# Patient Record
Sex: Male | Born: 1945 | Race: White | Hispanic: No | Marital: Married | Smoking: Former smoker
Health system: Southern US, Community
[De-identification: ages and names within clinical notes are randomized; demographics above are authoritative.]

## PROBLEM LIST (undated history)

## (undated) DIAGNOSIS — I2510 Atherosclerotic heart disease of native coronary artery without angina pectoris: Secondary | ICD-10-CM

## (undated) DIAGNOSIS — Z973 Presence of spectacles and contact lenses: Secondary | ICD-10-CM

## (undated) DIAGNOSIS — K219 Gastro-esophageal reflux disease without esophagitis: Secondary | ICD-10-CM

## (undated) DIAGNOSIS — Z974 Presence of external hearing-aid: Secondary | ICD-10-CM

## (undated) DIAGNOSIS — N2889 Other specified disorders of kidney and ureter: Secondary | ICD-10-CM

## (undated) DIAGNOSIS — Z952 Presence of prosthetic heart valve: Secondary | ICD-10-CM

## (undated) DIAGNOSIS — E785 Hyperlipidemia, unspecified: Secondary | ICD-10-CM

## (undated) DIAGNOSIS — I350 Nonrheumatic aortic (valve) stenosis: Secondary | ICD-10-CM

## (undated) DIAGNOSIS — R011 Cardiac murmur, unspecified: Secondary | ICD-10-CM

## (undated) DIAGNOSIS — Z5189 Encounter for other specified aftercare: Secondary | ICD-10-CM

## (undated) DIAGNOSIS — I219 Acute myocardial infarction, unspecified: Secondary | ICD-10-CM

## (undated) DIAGNOSIS — I10 Essential (primary) hypertension: Secondary | ICD-10-CM

## (undated) DIAGNOSIS — M1990 Unspecified osteoarthritis, unspecified site: Secondary | ICD-10-CM

## (undated) DIAGNOSIS — E119 Type 2 diabetes mellitus without complications: Secondary | ICD-10-CM

## (undated) DIAGNOSIS — Z8673 Personal history of transient ischemic attack (TIA), and cerebral infarction without residual deficits: Secondary | ICD-10-CM

## (undated) DIAGNOSIS — C259 Malignant neoplasm of pancreas, unspecified: Secondary | ICD-10-CM

## (undated) DIAGNOSIS — K08109 Complete loss of teeth, unspecified cause, unspecified class: Secondary | ICD-10-CM

## (undated) DIAGNOSIS — Z87891 Personal history of nicotine dependence: Secondary | ICD-10-CM

## (undated) DIAGNOSIS — H9190 Unspecified hearing loss, unspecified ear: Secondary | ICD-10-CM

## (undated) DIAGNOSIS — C649 Malignant neoplasm of unspecified kidney, except renal pelvis: Secondary | ICD-10-CM

## (undated) DIAGNOSIS — J189 Pneumonia, unspecified organism: Secondary | ICD-10-CM

## (undated) HISTORY — PX: HAND SURGERY: SHX662

## (undated) HISTORY — PX: HERNIA REPAIR: SHX51

## (undated) HISTORY — DX: Type 2 diabetes mellitus without complications: E11.9

## (undated) HISTORY — PX: APPENDECTOMY: SHX54

## (undated) HISTORY — PX: SHOULDER ARTHROSCOPY WITH ROTATOR CUFF REPAIR: SHX5685

## (undated) HISTORY — DX: Atherosclerotic heart disease of native coronary artery without angina pectoris: I25.10

## (undated) HISTORY — PX: UPPER GASTROINTESTINAL ENDOSCOPY: SHX188

## (undated) HISTORY — DX: Malignant neoplasm of unspecified kidney, except renal pelvis: C64.9

## (undated) HISTORY — DX: Unspecified osteoarthritis, unspecified site: M19.90

## (undated) HISTORY — DX: Essential (primary) hypertension: I10

## (undated) HISTORY — PX: MULTIPLE TOOTH EXTRACTIONS: SHX2053

## (undated) HISTORY — PX: CARDIAC CATHETERIZATION: SHX172

## (undated) HISTORY — DX: Personal history of transient ischemic attack (TIA), and cerebral infarction without residual deficits: Z86.73

## (undated) HISTORY — PX: EYE SURGERY: SHX253

## (undated) HISTORY — DX: Hyperlipidemia, unspecified: E78.5

## (undated) HISTORY — PX: CORONARY STENT PLACEMENT: SHX6853

## (undated) HISTORY — DX: Encounter for other specified aftercare: Z51.89

---
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
## 2008-08-31 ENCOUNTER — Emergency Department (HOSPITAL_COMMUNITY): Admission: EM | Admit: 2008-08-31 | Discharge: 2008-08-31 | Payer: Self-pay | Admitting: Emergency Medicine

## 2015-11-16 DIAGNOSIS — E119 Type 2 diabetes mellitus without complications: Secondary | ICD-10-CM | POA: Diagnosis not present

## 2015-11-16 DIAGNOSIS — F17200 Nicotine dependence, unspecified, uncomplicated: Secondary | ICD-10-CM | POA: Diagnosis not present

## 2015-11-16 DIAGNOSIS — I350 Nonrheumatic aortic (valve) stenosis: Secondary | ICD-10-CM | POA: Diagnosis not present

## 2015-11-16 DIAGNOSIS — I10 Essential (primary) hypertension: Secondary | ICD-10-CM | POA: Diagnosis not present

## 2015-11-16 DIAGNOSIS — E785 Hyperlipidemia, unspecified: Secondary | ICD-10-CM | POA: Diagnosis not present

## 2015-12-07 DIAGNOSIS — Z125 Encounter for screening for malignant neoplasm of prostate: Secondary | ICD-10-CM | POA: Diagnosis not present

## 2015-12-07 DIAGNOSIS — Z79899 Other long term (current) drug therapy: Secondary | ICD-10-CM | POA: Diagnosis not present

## 2015-12-07 DIAGNOSIS — I10 Essential (primary) hypertension: Secondary | ICD-10-CM | POA: Diagnosis not present

## 2015-12-07 DIAGNOSIS — K219 Gastro-esophageal reflux disease without esophagitis: Secondary | ICD-10-CM | POA: Diagnosis not present

## 2015-12-07 DIAGNOSIS — R0989 Other specified symptoms and signs involving the circulatory and respiratory systems: Secondary | ICD-10-CM | POA: Diagnosis not present

## 2015-12-07 DIAGNOSIS — E782 Mixed hyperlipidemia: Secondary | ICD-10-CM | POA: Diagnosis not present

## 2015-12-07 DIAGNOSIS — E119 Type 2 diabetes mellitus without complications: Secondary | ICD-10-CM | POA: Diagnosis not present

## 2015-12-07 DIAGNOSIS — R011 Cardiac murmur, unspecified: Secondary | ICD-10-CM | POA: Diagnosis not present

## 2015-12-11 DIAGNOSIS — E782 Mixed hyperlipidemia: Secondary | ICD-10-CM | POA: Diagnosis not present

## 2015-12-11 DIAGNOSIS — Z79899 Other long term (current) drug therapy: Secondary | ICD-10-CM | POA: Diagnosis not present

## 2015-12-11 DIAGNOSIS — I10 Essential (primary) hypertension: Secondary | ICD-10-CM | POA: Diagnosis not present

## 2015-12-11 DIAGNOSIS — E119 Type 2 diabetes mellitus without complications: Secondary | ICD-10-CM | POA: Diagnosis not present

## 2015-12-11 DIAGNOSIS — Z125 Encounter for screening for malignant neoplasm of prostate: Secondary | ICD-10-CM | POA: Diagnosis not present

## 2015-12-14 DIAGNOSIS — R0989 Other specified symptoms and signs involving the circulatory and respiratory systems: Secondary | ICD-10-CM | POA: Diagnosis not present

## 2016-01-08 DIAGNOSIS — H2513 Age-related nuclear cataract, bilateral: Secondary | ICD-10-CM | POA: Diagnosis not present

## 2016-01-08 DIAGNOSIS — Z720 Tobacco use: Secondary | ICD-10-CM | POA: Diagnosis not present

## 2016-01-08 DIAGNOSIS — H47321 Drusen of optic disc, right eye: Secondary | ICD-10-CM | POA: Diagnosis not present

## 2016-01-08 DIAGNOSIS — H524 Presbyopia: Secondary | ICD-10-CM | POA: Diagnosis not present

## 2016-01-22 DIAGNOSIS — Z720 Tobacco use: Secondary | ICD-10-CM | POA: Diagnosis not present

## 2016-01-22 DIAGNOSIS — Z0000 Encounter for general adult medical examination without abnormal findings: Secondary | ICD-10-CM | POA: Diagnosis not present

## 2016-01-22 DIAGNOSIS — E119 Type 2 diabetes mellitus without complications: Secondary | ICD-10-CM | POA: Diagnosis not present

## 2016-04-06 DIAGNOSIS — Z119 Encounter for screening for infectious and parasitic diseases, unspecified: Secondary | ICD-10-CM | POA: Diagnosis not present

## 2016-04-06 DIAGNOSIS — Z79899 Other long term (current) drug therapy: Secondary | ICD-10-CM | POA: Diagnosis not present

## 2016-04-06 DIAGNOSIS — E119 Type 2 diabetes mellitus without complications: Secondary | ICD-10-CM | POA: Diagnosis not present

## 2016-04-06 DIAGNOSIS — I10 Essential (primary) hypertension: Secondary | ICD-10-CM | POA: Diagnosis not present

## 2016-04-06 DIAGNOSIS — E782 Mixed hyperlipidemia: Secondary | ICD-10-CM | POA: Diagnosis not present

## 2016-04-11 DIAGNOSIS — K219 Gastro-esophageal reflux disease without esophagitis: Secondary | ICD-10-CM | POA: Diagnosis not present

## 2016-04-11 DIAGNOSIS — I10 Essential (primary) hypertension: Secondary | ICD-10-CM | POA: Diagnosis not present

## 2016-04-11 DIAGNOSIS — E782 Mixed hyperlipidemia: Secondary | ICD-10-CM | POA: Diagnosis not present

## 2016-04-11 DIAGNOSIS — R0989 Other specified symptoms and signs involving the circulatory and respiratory systems: Secondary | ICD-10-CM | POA: Diagnosis not present

## 2016-04-11 DIAGNOSIS — R011 Cardiac murmur, unspecified: Secondary | ICD-10-CM | POA: Diagnosis not present

## 2016-04-11 DIAGNOSIS — Z79899 Other long term (current) drug therapy: Secondary | ICD-10-CM | POA: Diagnosis not present

## 2016-04-11 DIAGNOSIS — E119 Type 2 diabetes mellitus without complications: Secondary | ICD-10-CM | POA: Diagnosis not present

## 2016-06-16 DIAGNOSIS — H35012 Changes in retinal vascular appearance, left eye: Secondary | ICD-10-CM | POA: Diagnosis not present

## 2016-08-01 DIAGNOSIS — Z79899 Other long term (current) drug therapy: Secondary | ICD-10-CM | POA: Diagnosis not present

## 2016-08-01 DIAGNOSIS — I10 Essential (primary) hypertension: Secondary | ICD-10-CM | POA: Diagnosis not present

## 2016-08-01 DIAGNOSIS — E782 Mixed hyperlipidemia: Secondary | ICD-10-CM | POA: Diagnosis not present

## 2016-08-01 DIAGNOSIS — E119 Type 2 diabetes mellitus without complications: Secondary | ICD-10-CM | POA: Diagnosis not present

## 2016-08-08 DIAGNOSIS — R011 Cardiac murmur, unspecified: Secondary | ICD-10-CM | POA: Diagnosis not present

## 2016-08-08 DIAGNOSIS — Z125 Encounter for screening for malignant neoplasm of prostate: Secondary | ICD-10-CM | POA: Diagnosis not present

## 2016-08-08 DIAGNOSIS — K219 Gastro-esophageal reflux disease without esophagitis: Secondary | ICD-10-CM | POA: Diagnosis not present

## 2016-08-08 DIAGNOSIS — E119 Type 2 diabetes mellitus without complications: Secondary | ICD-10-CM | POA: Diagnosis not present

## 2016-08-08 DIAGNOSIS — Z79899 Other long term (current) drug therapy: Secondary | ICD-10-CM | POA: Diagnosis not present

## 2016-08-08 DIAGNOSIS — R0989 Other specified symptoms and signs involving the circulatory and respiratory systems: Secondary | ICD-10-CM | POA: Diagnosis not present

## 2016-08-08 DIAGNOSIS — I10 Essential (primary) hypertension: Secondary | ICD-10-CM | POA: Diagnosis not present

## 2016-08-08 DIAGNOSIS — E782 Mixed hyperlipidemia: Secondary | ICD-10-CM | POA: Diagnosis not present

## 2016-10-20 DIAGNOSIS — H35043 Retinal micro-aneurysms, unspecified, bilateral: Secondary | ICD-10-CM | POA: Diagnosis not present

## 2016-11-11 DIAGNOSIS — I2109 ST elevation (STEMI) myocardial infarction involving other coronary artery of anterior wall: Secondary | ICD-10-CM | POA: Diagnosis not present

## 2016-11-11 DIAGNOSIS — I10 Essential (primary) hypertension: Secondary | ICD-10-CM | POA: Diagnosis not present

## 2016-11-11 DIAGNOSIS — F17210 Nicotine dependence, cigarettes, uncomplicated: Secondary | ICD-10-CM | POA: Diagnosis present

## 2016-11-11 DIAGNOSIS — I350 Nonrheumatic aortic (valve) stenosis: Secondary | ICD-10-CM | POA: Diagnosis not present

## 2016-11-11 DIAGNOSIS — I213 ST elevation (STEMI) myocardial infarction of unspecified site: Secondary | ICD-10-CM | POA: Diagnosis not present

## 2016-11-11 DIAGNOSIS — Z720 Tobacco use: Secondary | ICD-10-CM | POA: Diagnosis not present

## 2016-11-11 DIAGNOSIS — I2510 Atherosclerotic heart disease of native coronary artery without angina pectoris: Secondary | ICD-10-CM | POA: Diagnosis not present

## 2016-11-11 DIAGNOSIS — I2582 Chronic total occlusion of coronary artery: Secondary | ICD-10-CM | POA: Diagnosis not present

## 2016-11-11 DIAGNOSIS — R0789 Other chest pain: Secondary | ICD-10-CM | POA: Diagnosis not present

## 2016-11-11 DIAGNOSIS — E785 Hyperlipidemia, unspecified: Secondary | ICD-10-CM | POA: Diagnosis not present

## 2016-11-11 DIAGNOSIS — R079 Chest pain, unspecified: Secondary | ICD-10-CM | POA: Diagnosis not present

## 2016-11-11 DIAGNOSIS — E119 Type 2 diabetes mellitus without complications: Secondary | ICD-10-CM | POA: Diagnosis not present

## 2016-11-28 DIAGNOSIS — I2510 Atherosclerotic heart disease of native coronary artery without angina pectoris: Secondary | ICD-10-CM | POA: Diagnosis not present

## 2016-12-21 ENCOUNTER — Ambulatory Visit (INDEPENDENT_AMBULATORY_CARE_PROVIDER_SITE_OTHER): Payer: Medicare Other | Admitting: Cardiology

## 2016-12-21 ENCOUNTER — Encounter: Payer: Self-pay | Admitting: Cardiology

## 2016-12-21 VITALS — BP 162/84 | HR 69 | Ht 69.0 in | Wt 178.0 lb

## 2016-12-21 DIAGNOSIS — E118 Type 2 diabetes mellitus with unspecified complications: Secondary | ICD-10-CM

## 2016-12-21 DIAGNOSIS — E782 Mixed hyperlipidemia: Secondary | ICD-10-CM

## 2016-12-21 DIAGNOSIS — F17201 Nicotine dependence, unspecified, in remission: Secondary | ICD-10-CM | POA: Diagnosis not present

## 2016-12-21 DIAGNOSIS — I2109 ST elevation (STEMI) myocardial infarction involving other coronary artery of anterior wall: Secondary | ICD-10-CM

## 2016-12-21 DIAGNOSIS — I10 Essential (primary) hypertension: Secondary | ICD-10-CM | POA: Diagnosis not present

## 2016-12-21 DIAGNOSIS — I2510 Atherosclerotic heart disease of native coronary artery without angina pectoris: Secondary | ICD-10-CM | POA: Diagnosis not present

## 2016-12-21 DIAGNOSIS — R011 Cardiac murmur, unspecified: Secondary | ICD-10-CM

## 2016-12-21 NOTE — Progress Notes (Signed)
Cardiology Office Note  Date: 12/21/2016   ID: DELDRICK LINCH, DOB November 23, 1945, MRN [id number]  PCP: Renee Rival, NP  Consulting Cardiologist: Rozann Lesches, MD   Chief Complaint  Patient presents with  . Coronary Artery Disease    History of Present Illness: BARRETT GOLDIE is a 71 y.o. male presenting to the office to establish cardiology follow-up. This is our first meeting today. I reviewed extensive records and updated the chart. He is here today with his wife, lives in [place]. He is a retired Engineer, structural, enjoys Scientist, water quality and was traveling in [place] back in March when he presented with new onset chest tightness associated with bilateral arm numbness and diagnosis of an anterior STEMI on March 2. He underwent cardiac catheterization by Dr. Betsy Pries at [facility]. He was found to have a 70% mid LAD stenosis, occluded diagonal (culprit), normal circumflex, 90% ulcerated proximal RCA stenosis, and a 60-70% mid RCA stenosis. LVEF described at 60%. He underwent placement of DES to the mid LAD, diagonal, proximal RCA and mid RCA (4 stents total). He was discharged on aspirin and Brilinta, although due to dyspnea was switched from Brilinta to Plavix prior to leaving that area. He has had follow-up with his PCP since returning home, does not report any recurrent angina symptoms, recently got over a URI. He does not report any bleeding problems.  Patient reports a long-standing heart murmur, has not had a recent echocardiogram including during his hospital stay in [place]. He is not aware of any specific known valvular heart disease.  He states that he quit smoking this past August, has had no relapses. He reports compliance with his medications which are outlined below. He has been on a statin for hyperlipidemia, did not resume this after hospitalization in [place], was confused as to whether he should be on it or not.  I personally reviewed his  ECG today which shows sinus rhythm with poor anterior R progression, rule out old anterior infarct pattern, nonspecific ST-T changes.  We discussed his activity level. He is nearing 6 weeks out from his infarct. He does do some outdoor chores including using a riding Conservation officer, nature. We did discuss the possibility of cardiac rehabilitation but he did not want to pursue this at this point.  Past Medical History:  Diagnosis Date  . Arthritis   . CAD (coronary artery disease)    DES to mid LAD, DES to diagonal, DES x 2 to proximal and mid RCA - March 2018, [facility] [place]  . Essential hypertension   . Heart murmur   . Hyperlipidemia   . Type 2 diabetes mellitus ([place])     Past Surgical History:  Procedure Laterality Date  . APPENDECTOMY    . EYE SURGERY Left   . HAND SURGERY Bilateral   . HERNIA REPAIR Right   . SHOULDER ARTHROSCOPY WITH ROTATOR CUFF REPAIR Left     Current Outpatient Prescriptions  Medication Sig Dispense Refill  . aspirin EC 81 MG tablet Take 81 mg by mouth daily.    . carvedilol (COREG) 3.125 MG tablet TK ONE T PO BID  2  . clopidogrel (PLAVIX) 75 MG tablet TK  1 T  DAILY  6  . glipiZIDE (GLUCOTROL) 5 MG tablet Take by mouth daily before breakfast.    . lisinopril (PRINIVIL,ZESTRIL) 10 MG tablet TK ONE T PO D  2   No current facility-administered medications for this visit.    Allergies:  Patient has  no allergy information on record.   Social History: The patient  reports that he quit smoking about 8 months ago. His smoking use included Cigarettes. He started smoking about 58 years ago. He has never used smokeless tobacco. He reports that he does not drink alcohol or use drugs.   Family History: The patient's family history includes Lupus in his mother.   ROS:  Please see the history of present illness. Otherwise, complete review of systems is positive for arthritic pain affecting his back predominantly. States he had an old fracture in his back..  All other systems  are reviewed and negative.   Physical Exam: VS:  BP (!) 162/84 (BP Location: Right Arm)   Pulse 69   Ht 5' 9"  (1.753 m)   Wt 178 lb (80.7 kg)   SpO2 93%   BMI 26.29 kg/m , BMI Body mass index is 26.29 kg/m.  Wt Readings from Last 3 Encounters:  12/21/16 178 lb (80.7 kg)    General: Patient appears comfortable at rest. HEENT: Conjunctiva and lids normal, oropharynx clear. Neck: Supple, no elevated JVP, probable referred cardiac murmur on the right side of the neck, no thyromegaly. Lungs: Clear to auscultation, nonlabored breathing at rest. Cardiac: Regular rate and rhythm, no S3, 3/6 high-pitched systolic murmur throughout the precordium, no pericardial rub. Abdomen: Soft, nontender, bowel sounds present, no guarding or rebound. Extremities: No pitting edema, distal pulses 2+. Skin: Warm and dry. Musculoskeletal: No kyphosis. Neuropsychiatric: Alert and oriented x3, affect grossly appropriate.  ECG: No old tracing available for comparison.  Other Studies Reviewed Today:  Cardiac catheterization with PCI 11/11/2016 [facility]): Normal left main, 70% mid LAD stenosis, proximal first diagonal occluded, normal circumflex, large dominant RCA with 90% ulcerated proximal stenosis and 70-80% mid stenosis, LVEF 60% with LVEDP 20 mmHg and a 20 mm gradient across aortic valve. Patient underwent placement of Resolute Onyx DES to the mid LAD, diagonal, proximal RCA, and mid RCA.  Assessment and Plan:  1. Multivessel CAD status post anterior STEMI in early March, clinically stable at this time. Culprit lesion was a proximal diagonal branch. He underwent multivessel DES intervention in [place] as discussed above. Was switch from Brilinta to Plavix due to shortness of breath, now improved. LVEF reported to be 60% at angiography as well as evidence of aortic stenosis based on pullback gradient. He did not have an echocardiogram. Plan to continue current medications and gradually  resume regular activities. He is not interested in cardiac rehabilitation.  2. Long-standing cardiac murmur, aortic stenosis suggested although he states that he has never been told of any specific valvular abnormality. Will obtain an echocardiogram not only to reassess LVEF but evaluate valvular status.  3. Essential hypertension, currently on lisinopril. Blood pressure elevated today, may need further up titration. Keep follow-up with PCP.  4. Reported history of hyperlipidemia and previously on statin therapy. Patient's wife will call back to the office to clarify what he was taking previously, and he will plan to go back on it for now. We will obtain FLP and LFTs for his next visit.  5. Tobacco abuse in remission, quit smoking in August of last year.  6. Type 2 diabetes mellitus, on glipizide. Keep follow-up with PCP.  Current medicines were reviewed with the patient today.   Orders Placed This Encounter  Procedures  . Lipid Profile  . Hepatic function panel  . EKG 12-Lead  . ECHOCARDIOGRAM COMPLETE    Disposition: Follow-up in 3 months.  Signed,  Satira Sark, MD, [facility] 12/21/2016 9:12 AM    [facility] at [facility] [address], [place], [place] [address] Phone: [phone]; Fax: [phone]

## 2016-12-21 NOTE — Patient Instructions (Addendum)
Your physician recommends that you schedule a follow-up appointment in:  3 months Dr Domenic Polite  Get lab work JUST BEFORE next visit in 3 months  Call us with name of cholesterol medicine, go back on that med.   Your physician has requested that you have an echocardiogram. Echocardiography is a painless test that uses sound waves to create images of your heart. It provides your doctor with information about the size and shape of your heart and how well your heart's chambers and valves are working. This procedure takes approximately one hour. There are no restrictions for this procedure.    Your physician recommends that you continue on your current medications as directed. Please refer to the Current Medication list given to you today.     If you need a refill on your cardiac medications before your next appointment, please call your pharmacy.      Thank you for choosing [facility] !

## 2016-12-30 ENCOUNTER — Ambulatory Visit (HOSPITAL_COMMUNITY)
Admission: RE | Admit: 2016-12-30 | Discharge: 2016-12-30 | Disposition: A | Discharge status: Home / Self Care | Payer: Medicare Other | Source: Ambulatory Visit | Attending: Cardiology | Admitting: Cardiology

## 2016-12-30 DIAGNOSIS — I2510 Atherosclerotic heart disease of native coronary artery without angina pectoris: Secondary | ICD-10-CM | POA: Diagnosis not present

## 2016-12-30 DIAGNOSIS — I350 Nonrheumatic aortic (valve) stenosis: Secondary | ICD-10-CM | POA: Diagnosis not present

## 2016-12-30 DIAGNOSIS — I348 Other nonrheumatic mitral valve disorders: Secondary | ICD-10-CM | POA: Diagnosis not present

## 2016-12-30 NOTE — Progress Notes (Signed)
*  PRELIMINARY RESULTS* Echocardiogram 2D Echocardiogram has been performed.  Leavy Cella 12/30/2016, 11:43 AM

## 2017-01-26 ENCOUNTER — Telehealth: Payer: Self-pay | Admitting: Cardiology

## 2017-01-26 MED ORDER — ASPIRIN EC 81 MG PO TBEC: 81.0000 mg | DELAYED_RELEASE_TABLET | Freq: Every day | ORAL | 3 refills | Status: DC

## 2017-01-26 NOTE — Telephone Encounter (Signed)
States that patient needs RX for ASA sent to [facility] in [place] / [place]

## 2017-01-26 NOTE — Telephone Encounter (Signed)
Sent in RX for ASA.

## 2017-02-17 DIAGNOSIS — Z125 Encounter for screening for malignant neoplasm of prostate: Secondary | ICD-10-CM | POA: Diagnosis not present

## 2017-02-17 DIAGNOSIS — E119 Type 2 diabetes mellitus without complications: Secondary | ICD-10-CM | POA: Diagnosis not present

## 2017-02-17 DIAGNOSIS — Z79899 Other long term (current) drug therapy: Secondary | ICD-10-CM | POA: Diagnosis not present

## 2017-02-17 DIAGNOSIS — I10 Essential (primary) hypertension: Secondary | ICD-10-CM | POA: Diagnosis not present

## 2017-02-17 DIAGNOSIS — E782 Mixed hyperlipidemia: Secondary | ICD-10-CM | POA: Diagnosis not present

## 2017-02-24 DIAGNOSIS — Z79899 Other long term (current) drug therapy: Secondary | ICD-10-CM | POA: Diagnosis not present

## 2017-02-24 DIAGNOSIS — R0989 Other specified symptoms and signs involving the circulatory and respiratory systems: Secondary | ICD-10-CM | POA: Diagnosis not present

## 2017-02-24 DIAGNOSIS — R011 Cardiac murmur, unspecified: Secondary | ICD-10-CM | POA: Diagnosis not present

## 2017-02-24 DIAGNOSIS — I10 Essential (primary) hypertension: Secondary | ICD-10-CM | POA: Diagnosis not present

## 2017-02-24 DIAGNOSIS — I2510 Atherosclerotic heart disease of native coronary artery without angina pectoris: Secondary | ICD-10-CM | POA: Diagnosis not present

## 2017-02-24 DIAGNOSIS — K219 Gastro-esophageal reflux disease without esophagitis: Secondary | ICD-10-CM | POA: Diagnosis not present

## 2017-02-24 DIAGNOSIS — E782 Mixed hyperlipidemia: Secondary | ICD-10-CM | POA: Diagnosis not present

## 2017-02-24 DIAGNOSIS — E119 Type 2 diabetes mellitus without complications: Secondary | ICD-10-CM | POA: Diagnosis not present

## 2017-02-24 DIAGNOSIS — Z0000 Encounter for general adult medical examination without abnormal findings: Secondary | ICD-10-CM | POA: Diagnosis not present

## 2017-03-09 DIAGNOSIS — L820 Inflamed seborrheic keratosis: Secondary | ICD-10-CM | POA: Diagnosis not present

## 2017-03-09 DIAGNOSIS — L708 Other acne: Secondary | ICD-10-CM | POA: Diagnosis not present

## 2017-03-09 DIAGNOSIS — X32XXXA Exposure to sunlight, initial encounter: Secondary | ICD-10-CM | POA: Diagnosis not present

## 2017-03-09 DIAGNOSIS — L570 Actinic keratosis: Secondary | ICD-10-CM | POA: Diagnosis not present

## 2017-03-10 ENCOUNTER — Other Ambulatory Visit: Payer: Self-pay | Admitting: *Deleted

## 2017-03-10 MED ORDER — LISINOPRIL 10 MG PO TABS: 10.0000 mg | ORAL_TABLET | Freq: Every day | ORAL | 6 refills | Status: DC

## 2017-03-10 MED ORDER — CARVEDILOL 3.125 MG PO TABS: 3.1250 mg | ORAL_TABLET | Freq: Two times a day (BID) | ORAL | 6 refills | Status: DC

## 2017-03-16 ENCOUNTER — Other Ambulatory Visit (HOSPITAL_COMMUNITY)
Admission: RE | Admit: 2017-03-16 | Discharge: 2017-03-16 | Disposition: A | Discharge status: Home / Self Care | Payer: Medicare Other | Source: Ambulatory Visit | Attending: Cardiology | Admitting: Cardiology

## 2017-03-16 DIAGNOSIS — I2510 Atherosclerotic heart disease of native coronary artery without angina pectoris: Secondary | ICD-10-CM | POA: Insufficient documentation

## 2017-03-16 LAB — HEPATIC FUNCTION PANEL
ALT: 12 U/L — ABNORMAL LOW (ref 17–63)
AST: 15 U/L (ref 15–41)
Albumin: 4 g/dL (ref 3.5–5.0)
Alkaline Phosphatase: 74 U/L (ref 38–126)
Bilirubin, Direct: 0.1 mg/dL (ref 0.1–0.5)
Indirect Bilirubin: 0.4 mg/dL (ref 0.3–0.9)
Total Bilirubin: 0.5 mg/dL (ref 0.3–1.2)
Total Protein: 7.2 g/dL (ref 6.5–8.1)

## 2017-03-16 LAB — LIPID PANEL
Cholesterol: 102 mg/dL (ref 0–200)
HDL: 36 mg/dL — ABNORMAL LOW (ref >40)
LDL Cholesterol: 52 mg/dL (ref 0–99)
Total CHOL/HDL Ratio: 2.8 RATIO
Triglycerides: 71 mg/dL (ref <150)
VLDL: 14 mg/dL (ref 0–40)

## 2017-03-21 ENCOUNTER — Encounter: Payer: Self-pay | Admitting: Cardiology

## 2017-03-21 NOTE — Progress Notes (Signed)
Cardiology Office Note  Date: 03/22/2017   ID: Manuel Olson, DOB [birth year], MRN [id number]  PCP: Renee Rival, NP  Primary Cardiologist: Rozann Lesches, MD   Chief Complaint  Patient presents with  . Coronary Artery Disease  . Aortic Stenosis    History of Present Illness: Manuel Olson is a 71 y.o. male that I met in April. He presents for a routine follow-up visit. Reports no angina or definite decline in stamina. He does have to pace himself and states that some activities do make him short of breath. We had a long talk today about his aortic stenosis. This had not recently been objectively assessed until I met him in April and we obtained an echocardiogram, although he has been aware of a long-term heart murmur.  I reviewed his recent lab work as outlined below. LDL well controlled on Lipitor.  Current cardiac regimen reviewed. He continues on aspirin and Plavix, reports no bleeding problems.  Echocardiogram from April is detailed below. Aortic stenosis in the severe range.  Past Medical History:  Diagnosis Date  . Aortic stenosis   . Arthritis   . CAD (coronary artery disease)    DES to mid LAD, DES to diagonal, DES x 2 to proximal and mid RCA - March 2018, [facility] [place]  . Essential hypertension   . Hyperlipidemia   . Type 2 diabetes mellitus ([place])     Past Surgical History:  Procedure Laterality Date  . APPENDECTOMY    . EYE SURGERY Left   . HAND SURGERY Bilateral   . HERNIA REPAIR Right   . SHOULDER ARTHROSCOPY WITH ROTATOR CUFF REPAIR Left     Current Outpatient Prescriptions  Medication Sig Dispense Refill  . aspirin EC 81 MG tablet Take 1 tablet (81 mg total) by mouth daily. 90 tablet 3  . atorvastatin (LIPITOR) 20 MG tablet Take 20 mg by mouth daily.    . carvedilol (COREG) 3.125 MG tablet Take 1 tablet (3.125 mg total) by mouth 2 (two) times daily with a meal. 60 tablet 6  . clopidogrel (PLAVIX) 75 MG tablet TK  1 T  DAILY  6  .  glipiZIDE (GLUCOTROL) 5 MG tablet Take by mouth daily before breakfast.    . lisinopril (PRINIVIL,ZESTRIL) 10 MG tablet Take 1 tablet (10 mg total) by mouth daily. 30 tablet 6   No current facility-administered medications for this visit.    Allergies:  Patient has no known allergies.   Social History: The patient  reports that he quit smoking about 11 months ago. His smoking use included Cigarettes. He started smoking about 58 years ago. He has a 57.00 pack-year smoking history. He has never used smokeless tobacco. He reports that he does not drink alcohol or use drugs.   ROS:  Please see the history of present illness. Otherwise, complete review of systems is positive for none.  All other systems are reviewed and negative.   Physical Exam: VS:  BP (!) 168/84   Pulse 63   Ht 5' 9" (1.753 m)   Wt 178 lb 9.6 oz (81 kg)   SpO2 94% Comment: on room air  BMI 26.37 kg/m , BMI Body mass index is 26.37 kg/m.  Wt Readings from Last 3 Encounters:  03/22/17 178 lb 9.6 oz (81 kg)  12/21/16 178 lb (80.7 kg)    General: Patient appears comfortable at rest. HEENT: Conjunctiva and lids normal, oropharynx clear. Neck: Supple, no elevated JVP, probable referred cardiac murmur on  the right side of the neck, no thyromegaly. Lungs: Clear to auscultation, nonlabored breathing at rest. Cardiac: Regular rate and rhythm, no S3, 3/6 high-pitched systolic murmur throughout the precordium, no pericardial rub. Abdomen: Soft, nontender, bowel sounds present, no guarding or rebound. Extremities: No pitting edema, distal pulses 2+. Skin: Warm and dry. Musculoskeletal: No kyphosis. Neuropsychiatric: Alert and oriented x3, affect grossly appropriate.  ECG: I personally reviewed the tracing from 12/21/2016 which showed sinus rhythm with poor R-wave progression and nonspecific T-wave changes.  Recent Labwork: 03/16/2017: ALT 12; AST 15     Component Value Date/Time   CHOL 102 03/16/2017 0840   TRIG 71  03/16/2017 0840   HDL 36 (L) 03/16/2017 0840   CHOLHDL 2.8 03/16/2017 0840   VLDL 14 03/16/2017 0840   LDLCALC 52 03/16/2017 0840  June 2018: Hemoglobin 13.8, platelets 166, BUN 20, creatinine 1.2, potassium 4.4, AST 15, ALT 18, hemoglobin A1c 6.8, cholesterol 100, triglycerides 101, HDL 38, LDL 42  Other Studies Reviewed Today:  Cardiac catheterization with PCI 11/11/2016 [facility]): Normal left main, 70% mid LAD stenosis, proximal first diagonal occluded, normal circumflex, large dominant RCA with 90% ulcerated proximal stenosis and 70-80% mid stenosis, LVEF 60% with LVEDP 20 mmHg and a 20 mm gradient across aortic valve. Patient underwent placement of Resolute Onyx DES to the mid LAD, diagonal, proximal RCA, and mid RCA.  Echocardiogram 12/30/2016: Study Conclusions  - Left ventricle: The cavity size was normal. Wall thickness was   increased in a pattern of moderate LVH. Systolic function was   normal. The estimated ejection fraction was in the range of 55%   to 60%. Wall motion was normal; there were no regional wall   motion abnormalities. Doppler parameters are consistent with   abnormal left ventricular relaxation (grade 1 diastolic   dysfunction). - Aortic valve: Moderately calcified annulus. Severely thickened   leaflets. There was severe stenosis. Mean gradient (S): 49 mm Hg.   Valve area (VTI): 0.68 cm^2. Valve area (Vmax): 0.62 cm^2. Valve   area (Vmean): 0.63 cm^2. - Mitral valve: Mildly calcified annulus. Mildly thickened leaflets. - Atrial septum: No defect or patent foramen ovale was identified. - Technically adequate study.  Assessment and Plan:  1. Multivessel CAD status post anterior STEMI in March of this year, treated with multiple DES intervention at a facility in [place] as detailed above. He reports no angina and continues on medical therapy including DAPT. LVEF 55-60% by follow-up echocardiogram.  2. Aortic stenosis, severe range by  echocardiogram in April. Patient states that he had been aware of a long-term heart murmur, although not clear that this had been assessed objectively until our evaluation. We had a long talk about natural history of stenosis and management options including traditional AVR and TAVR. He does not endorse any definite decline in stamina, although does admit that he paces himself and perhaps minimizes symptoms. Plan is to obtain a follow-up echocardiogram in a few months which would be 6 months out from the last study, and I will see him back at that time. We will plan referral for further evaluation from there.  3. Hyperlipidemia,, tolerating Lipitor with good LDL control.  4. Tobacco abuse in remission.  Current medicines were reviewed with the patient today.   Orders Placed This Encounter  Procedures  . ECHOCARDIOGRAM COMPLETE    Disposition: Follow-up in 3 months.  Signed, Satira Sark, MD, [facility] 03/22/2017 9:00 AM    [place] [facility]  [facility] [address] [place], [place] [address] Phone: [phone]; Fax: [phone]

## 2017-03-22 ENCOUNTER — Ambulatory Visit (INDEPENDENT_AMBULATORY_CARE_PROVIDER_SITE_OTHER): Payer: Medicare Other | Admitting: Cardiology

## 2017-03-22 ENCOUNTER — Encounter: Payer: Self-pay | Admitting: Cardiology

## 2017-03-22 VITALS — BP 168/84 | HR 63 | Ht 69.0 in | Wt 178.6 lb

## 2017-03-22 DIAGNOSIS — I2510 Atherosclerotic heart disease of native coronary artery without angina pectoris: Secondary | ICD-10-CM | POA: Diagnosis not present

## 2017-03-22 DIAGNOSIS — I350 Nonrheumatic aortic (valve) stenosis: Secondary | ICD-10-CM

## 2017-03-22 DIAGNOSIS — F17201 Nicotine dependence, unspecified, in remission: Secondary | ICD-10-CM | POA: Diagnosis not present

## 2017-03-22 DIAGNOSIS — E782 Mixed hyperlipidemia: Secondary | ICD-10-CM

## 2017-03-22 NOTE — Patient Instructions (Signed)
Medication Instructions:  Your physician recommends that you continue on your current medications as directed. Please refer to the Current Medication list given to you today.   Labwork: none  Testing/Procedures: Your physician has requested that you have an echocardiogram. Echocardiography is a painless test that uses sound waves to create images of your heart. It provides your doctor with information about the size and shape of your heart and how well your heart's chambers and valves are working. This procedure takes approximately one hour. There are no restrictions for this procedure.    Follow-Up: Your physician recommends that you schedule a follow-up appointment in: 3 months    Any Other Special Instructions Will Be Listed Below (If Applicable).     If you need a refill on your cardiac medications before your next appointment, please call your pharmacy.

## 2017-04-18 DIAGNOSIS — H25813 Combined forms of age-related cataract, bilateral: Secondary | ICD-10-CM | POA: Diagnosis not present

## 2017-04-18 DIAGNOSIS — Z8669 Personal history of other diseases of the nervous system and sense organs: Secondary | ICD-10-CM | POA: Diagnosis not present

## 2017-04-18 DIAGNOSIS — E119 Type 2 diabetes mellitus without complications: Secondary | ICD-10-CM | POA: Diagnosis not present

## 2017-04-18 DIAGNOSIS — H47323 Drusen of optic disc, bilateral: Secondary | ICD-10-CM | POA: Diagnosis not present

## 2017-06-15 DIAGNOSIS — X32XXXD Exposure to sunlight, subsequent encounter: Secondary | ICD-10-CM | POA: Diagnosis not present

## 2017-06-15 DIAGNOSIS — L570 Actinic keratosis: Secondary | ICD-10-CM | POA: Diagnosis not present

## 2017-06-15 DIAGNOSIS — D225 Melanocytic nevi of trunk: Secondary | ICD-10-CM | POA: Diagnosis not present

## 2017-06-19 ENCOUNTER — Ambulatory Visit (HOSPITAL_COMMUNITY)
Admission: RE | Admit: 2017-06-19 | Discharge: 2017-06-19 | Disposition: A | Discharge status: Home / Self Care | Payer: Medicare Other | Source: Ambulatory Visit | Attending: Cardiology | Admitting: Cardiology

## 2017-06-19 DIAGNOSIS — I352 Nonrheumatic aortic (valve) stenosis with insufficiency: Secondary | ICD-10-CM | POA: Diagnosis not present

## 2017-06-19 DIAGNOSIS — I350 Nonrheumatic aortic (valve) stenosis: Secondary | ICD-10-CM

## 2017-06-19 LAB — ECHOCARDIOGRAM COMPLETE
AO mean calculated velocity dopler: 302 cm/s
AV Area VTI index: 0.31 cm2/m2
AV Area VTI: 0.62 cm2
AV Area mean vel: 0.59 cm2
AV Mean grad: 43 mmHg
AV Peak grad: 72 mmHg
AV VEL mean LVOT/AV: 0.21
AV area mean vel ind: 0.29 cm2/m2
AV peak Index: 0.31
AV pk vel: 425 cm/s
AV vel: 0.61
Ao pk vel: 0.22 m/s
E decel time: 408 msec
E/e' ratio: 14.39
FS: 28 % (ref 28–44)
IVS/LV PW RATIO, ED: 0.91
LA ID, A-P, ES: 38 mm
LA diam end sys: 38 mm
LA diam index: 1.9 cm/m2
LA vol A4C: 61.4 ml
LA vol index: 28.2 mL/m2
LA vol: 56.4 mL
LV E/e' medial: 14.39
LV E/e'average: 14.39
LV PW d: 14.1 mm — AB (ref 0.6–1.1)
LV dias vol index: 47 mL/m2
LV dias vol: 94 mL (ref 62–150)
LV e' LATERAL: 4.35 cm/s
LV sys vol index: 22 mL/m2
LV sys vol: 45 mL (ref 21–61)
LVOT SV: 67 mL
LVOT VTI: 23.7 cm
LVOT area: 2.84 cm2
LVOT diameter: 19 mm
LVOT peak VTI: 0.21 cm
LVOT peak grad rest: 3 mmHg
LVOT peak vel: 93.3 cm/s
Lateral S' vel: 10.4 cm/s
MV Dec: 408
MV pk A vel: 116 m/s
MV pk E vel: 62.6 m/s
P 1/2 time: 388 ms
RV sys press: 28 mmHg
Reg peak vel: 225 cm/s
Simpson's disk: 52
Stroke v: 49 ml
TAPSE: 19.9 mm
TDI e' lateral: 4.35
TDI e' medial: 3.48
TR max vel: 225 cm/s
VTI: 111 cm
Valve area index: 0.31
Valve area: 0.61 cm2

## 2017-06-19 NOTE — Progress Notes (Signed)
*  PRELIMINARY RESULTS* Echocardiogram 2D Echocardiogram has been performed.  Manuel Olson 06/19/2017, 10:35 AM

## 2017-06-23 ENCOUNTER — Other Ambulatory Visit: Payer: Self-pay | Admitting: Cardiology

## 2017-06-23 ENCOUNTER — Encounter: Payer: Self-pay | Admitting: Cardiology

## 2017-06-23 ENCOUNTER — Ambulatory Visit (INDEPENDENT_AMBULATORY_CARE_PROVIDER_SITE_OTHER): Payer: Medicare Other | Admitting: Cardiology

## 2017-06-23 VITALS — BP 142/76 | HR 73 | Ht 70.0 in | Wt 176.0 lb

## 2017-06-23 DIAGNOSIS — I350 Nonrheumatic aortic (valve) stenosis: Secondary | ICD-10-CM

## 2017-06-23 DIAGNOSIS — I25119 Atherosclerotic heart disease of native coronary artery with unspecified angina pectoris: Secondary | ICD-10-CM | POA: Diagnosis not present

## 2017-06-23 DIAGNOSIS — E782 Mixed hyperlipidemia: Secondary | ICD-10-CM

## 2017-06-23 DIAGNOSIS — I209 Angina pectoris, unspecified: Secondary | ICD-10-CM

## 2017-06-23 DIAGNOSIS — Z01818 Encounter for other preprocedural examination: Secondary | ICD-10-CM

## 2017-06-23 DIAGNOSIS — F17201 Nicotine dependence, unspecified, in remission: Secondary | ICD-10-CM

## 2017-06-23 NOTE — Patient Instructions (Signed)
   [place] [address] Dept: [phone] Loc: [phone]  Manuel Olson  06/23/2017  You are scheduled for a Cardiac Catheterization on Wednesday, October 24 with Dr. [name].  1. Please arrive at the [facility] (Main Entrance A) at [facility]: [address] [place], [place] [address] at 6:30 AM (two hours before your procedure to ensure your preparation). Free valet parking service is available.   Special note: Every effort is made to have your procedure done on time. Please understand that emergencies sometimes delay scheduled procedures.  2. Diet: Do not eat or drink anything after midnight prior to your procedure except sips of water to take medications.  3. Labs:get lab work on Monday 06/26/17 at [place] get chest x-ray too  4. Medication instructions in preparation for your procedure:       HOLD GLIPIZIDE THE MORNING OF CATH    On the morning of your procedure, take your Aspirin and any morning medicines NOT listed above.  You may use sips of water.  5. Plan for one night stay--bring personal belongings. 6. Bring a current list of your medications and current insurance cards. 7. You MUST have a responsible person to drive you home. 8. Someone MUST be with you the first 24 hours after you arrive home or your discharge will be delayed. 9. Please wear clothes that are easy to get on and off and wear slip-on shoes.  Thank you for allowing [place] to care for you!   -- [facility]

## 2017-06-23 NOTE — Progress Notes (Signed)
Cardiology Office Note  Date: 06/23/2017   ID: Manuel Olson, DOB Feb 12, 1946, MRN [id number]  PCP: Renee Rival, NP  Primary Cardiologist: Rozann Lesches, MD   Chief Complaint  Patient presents with  . Coronary Artery Disease  . Aortic Stenosis    History of Present Illness: Manuel Olson is a 71 y.o. male last seen in July. He is here today with his wife for a follow-up visit. He does not report any angina symptoms on medical therapy. He has NYHA class 2-3 dyspnea, but actually tends to limit his exertion in general. His wife also watches him closely with activities. He does not report any palpitations, orthopnea, or syncope.  I reviewed his medications which are outlined below. Cardiac regimen is stable. He reports no bleeding problems on aspirin and Plavix.  Recent follow-up echocardiogram is reviewed below. He has evidence of severe aortic stenosis with mean gradient 43 mmHg and peak gradient 72 mmHg. We discussed these results today. We also went over arranging further evaluation for aortic valve replacement. He has a long-standing history of heart murmur, although not clear that this was objectively evaluated until he established with our practice 6 months ago. Echocardiographic findings are relatively stable over the last 6 months.  Cardiac catheterization from March of this year in [place] is outlined below.  Past Medical History:  Diagnosis Date  . Aortic stenosis   . Arthritis   . CAD (coronary artery disease)    DES to mid LAD, DES to diagonal, DES x 2 to proximal and mid RCA - March 2018, [facility] [place]  . Essential hypertension   . Hyperlipidemia   . Type 2 diabetes mellitus ([place])     Past Surgical History:  Procedure Laterality Date  . APPENDECTOMY    . EYE SURGERY Left   . HAND SURGERY Bilateral   . HERNIA REPAIR Right   . SHOULDER ARTHROSCOPY WITH ROTATOR CUFF REPAIR Left     Current Outpatient Prescriptions  Medication Sig  Dispense Refill  . aspirin EC 81 MG tablet Take 1 tablet (81 mg total) by mouth daily. 90 tablet 3  . atorvastatin (LIPITOR) 20 MG tablet Take 20 mg by mouth daily.    . carvedilol (COREG) 3.125 MG tablet Take 1 tablet (3.125 mg total) by mouth 2 (two) times daily with a meal. 60 tablet 6  . clopidogrel (PLAVIX) 75 MG tablet TK  1 T  DAILY  6  . glipiZIDE (GLUCOTROL) 5 MG tablet Take by mouth daily before breakfast.    . lisinopril (PRINIVIL,ZESTRIL) 10 MG tablet Take 1 tablet (10 mg total) by mouth daily. 30 tablet 6   No current facility-administered medications for this visit.    Allergies:  Patient has no known allergies.   Social History: The patient  reports that he quit smoking about 14 months ago. His smoking use included Cigarettes. He started smoking about 58 years ago. He has a 57.00 pack-year smoking history. He has never used smokeless tobacco. He reports that he does not drink alcohol or use drugs.   Family History: The patient's family history includes CAD in his brother; Hypertension in his brother; Lupus in his mother.   ROS:  Please see the history of present illness. Otherwise, complete review of systems is positive for none.  All other systems are reviewed and negative.   Physical Exam: VS:  BP (!) 142/76   Pulse 73   Ht 5' 10"  (1.778 m)   Wt 176 lb (  79.8 kg)   SpO2 95%   BMI 25.25 kg/m , BMI Body mass index is 25.25 kg/m.  Wt Readings from Last 3 Encounters:  06/23/17 176 lb (79.8 kg)  03/22/17 178 lb 9.6 oz (81 kg)  12/21/16 178 lb (80.7 kg)    General: Patient appears comfortable at rest. HEENT: Conjunctiva and lids normal, oropharynx clear. Neck: Supple, no elevated JVP, referred cardiac murmur to the right, no thyromegaly. Lungs: Clear to auscultation, nonlabored breathing at rest. Cardiac: Regular rate and rhythm, no S3, 3/6 systolic murmur with diminished second heart sound, no pericardial rub. Abdomen: Soft, nontender, bowel sounds present, no  guarding or rebound. Extremities: No pitting edema, distal pulses 2+. Skin: Warm and dry. Musculoskeletal: No kyphosis. Neuropsychiatric: Alert and oriented x3, affect grossly appropriate.  ECG: I personally reviewed the tracing from 12/21/2016 which shows sinus rhythm with poor R-wave progression rule out old anterior infarct pattern, nonspecific T-wave changes.  Recent Labwork: 03/16/2017: ALT 12; AST 15     Component Value Date/Time   CHOL 102 03/16/2017 0840   TRIG 71 03/16/2017 0840   HDL 36 (L) 03/16/2017 0840   CHOLHDL 2.8 03/16/2017 0840   VLDL 14 03/16/2017 0840   LDLCALC 52 03/16/2017 0840    Other Studies Reviewed Today:  Cardiac catheterization with PCI 11/11/2016 [facility]): Normal left main, 70% mid LAD stenosis, proximal first diagonal occluded, normal circumflex, large dominant RCA with 90% ulcerated proximal stenosis and 70-80% mid stenosis, LVEF 60% with LVEDP 20 mmHg and a 20 mm gradient across aortic valve. Patient underwent placement of Resolute OnyxDES to the mid LAD, diagonal, proximal RCA, and mid RCA.  Echocardiogram 06/19/2017: Study Conclusions  - Left ventricle: The cavity size was normal. There was mild   concentric hypertrophy. Systolic function was normal. The   estimated ejection fraction was in the range of 55% to 60%.   Doppler parameters are consistent with abnormal left ventricular   relaxation (grade 1 diastolic dysfunction). - Aortic valve: There was severe stenosis. There was mild   regurgitation. Valve area (VTI): 0.61 cm^2. Valve area (Vmax):   0.62 cm^2. Valve area (Vmean): 0.59 cm^2. - Mitral valve: Calcified annulus. Mildly thickened leaflets . - Left atrium: The atrium was mildly dilated. - Atrial septum: No defect or patent foramen ovale was identified.  Assessment and Plan:  1. Severe calcific aortic stenosis with mean gradient 43-49 mmHg. He has a long-standing history of heart murmur, heavily calcified valve  looks to be trileaflet. Describes NYHA class 2-3 dyspnea, but he does tend to minimize symptoms, and has been limiting his level of activity. Plan is to get him scheduled for a left and right heart catheterization with Dr. Burt Knack to initiate the process for considering AVR options.  2. Multivessel CAD status post anterior STEMI in March of this year, managed with multiple DES interventions in [facility] as outlined above. Coronary anatomy will be reassessed in the evaluation of his aortic stenosis. He continues on dual antiplatelet therapy. LVEF normal.  3. Hyperlipidemia, continues on Lipitor. LDL 52.  4. Tobacco abuse in remission.  Current medicines were reviewed with the patient today.   Orders Placed This Encounter  Procedures  . DG Chest 2 View  . CBC w/Diff/Platelet  . Basic Metabolic Panel (BMET)  . INR/PT    Disposition: Follow-up after cardiac catheterization.  Signed, Satira Sark, MD, [facility] 06/23/2017 10:11 AM    [facility] [place],  [place], [place] [address] Phone: [phone]; Fax: [phone]

## 2017-06-26 ENCOUNTER — Other Ambulatory Visit (HOSPITAL_COMMUNITY)
Admission: RE | Admit: 2017-06-26 | Discharge: 2017-06-26 | Disposition: A | Discharge status: Home / Self Care | Payer: Medicare Other | Source: Ambulatory Visit | Attending: Cardiology | Admitting: Cardiology

## 2017-06-26 ENCOUNTER — Ambulatory Visit (HOSPITAL_COMMUNITY)
Admission: RE | Admit: 2017-06-26 | Discharge: 2017-06-26 | Disposition: A | Discharge status: Home / Self Care | Payer: Medicare Other | Source: Ambulatory Visit | Attending: Cardiology | Admitting: Cardiology

## 2017-06-26 DIAGNOSIS — I10 Essential (primary) hypertension: Secondary | ICD-10-CM | POA: Diagnosis not present

## 2017-06-26 DIAGNOSIS — Z01818 Encounter for other preprocedural examination: Secondary | ICD-10-CM | POA: Insufficient documentation

## 2017-06-26 DIAGNOSIS — I700 Atherosclerosis of aorta: Secondary | ICD-10-CM | POA: Diagnosis not present

## 2017-06-26 DIAGNOSIS — J8410 Pulmonary fibrosis, unspecified: Secondary | ICD-10-CM | POA: Diagnosis not present

## 2017-06-26 LAB — CBC WITH DIFFERENTIAL/PLATELET
Basophils Absolute: 0 10*3/uL (ref 0.0–0.1)
Basophils Relative: 0 %
Eosinophils Absolute: 0.3 10*3/uL (ref 0.0–0.7)
Eosinophils Relative: 3 %
HCT: 40.2 % (ref 39.0–52.0)
Hemoglobin: 13.1 g/dL (ref 13.0–17.0)
Lymphocytes Relative: 20 %
Lymphs Abs: 1.6 10*3/uL (ref 0.7–4.0)
MCH: 29.7 pg (ref 26.0–34.0)
MCHC: 32.6 g/dL (ref 30.0–36.0)
MCV: 91.2 fL (ref 78.0–100.0)
Monocytes Absolute: 0.8 10*3/uL (ref 0.1–1.0)
Monocytes Relative: 10 %
Neutro Abs: 5.4 10*3/uL (ref 1.7–7.7)
Neutrophils Relative %: 67 %
Platelets: 160 10*3/uL (ref 150–400)
RBC: 4.41 MIL/uL (ref 4.22–5.81)
RDW: 13.6 % (ref 11.5–15.5)
WBC: 8 10*3/uL (ref 4.0–10.5)

## 2017-06-26 LAB — BASIC METABOLIC PANEL
Anion gap: 7 (ref 5–15)
BUN: 19 mg/dL (ref 6–20)
CO2: 27 mmol/L (ref 22–32)
Calcium: 9.2 mg/dL (ref 8.9–10.3)
Chloride: 102 mmol/L (ref 101–111)
Creatinine, Ser: 1.03 mg/dL (ref 0.61–1.24)
GFR calc Af Amer: >60 mL/min (ref >60)
GFR calc non Af Amer: >60 mL/min (ref >60)
Glucose, Bld: 236 mg/dL — ABNORMAL HIGH (ref 65–99)
Potassium: 4.6 mmol/L (ref 3.5–5.1)
Sodium: 136 mmol/L (ref 135–145)

## 2017-06-26 LAB — PROTIME-INR
INR: 0.98
Prothrombin Time: 12.9 seconds (ref 11.4–15.2)

## 2017-07-03 ENCOUNTER — Telehealth: Payer: Self-pay

## 2017-07-03 NOTE — Telephone Encounter (Signed)
Patient contacted pre-catheterization at [facility] scheduled for:  07/05/2017 @ 0830 Verified arrival time and place:  NT @ 0630 Confirmed AM meds to be taken pre-cath with sip of water: Take ASA/Plavix Hold glipizide Confirmed patient has responsible person to drive home post procedure and observe patient for 24 hours:  yes Addl concerns:  none

## 2017-07-05 ENCOUNTER — Ambulatory Visit (HOSPITAL_COMMUNITY)
Admission: RE | Disposition: A | Discharge status: Home / Self Care | Payer: Self-pay | Source: Ambulatory Visit | Attending: Cardiovascular Disease

## 2017-07-05 ENCOUNTER — Ambulatory Visit (HOSPITAL_COMMUNITY)
Admission: RE | Admit: 2017-07-05 | Discharge: 2017-07-05 | Disposition: A | Discharge status: Home / Self Care | Payer: Medicare Other | Source: Ambulatory Visit | Attending: Cardiovascular Disease | Admitting: Cardiovascular Disease

## 2017-07-05 ENCOUNTER — Encounter (HOSPITAL_COMMUNITY): Payer: Self-pay | Admitting: Cardiovascular Disease

## 2017-07-05 DIAGNOSIS — E119 Type 2 diabetes mellitus without complications: Secondary | ICD-10-CM | POA: Diagnosis not present

## 2017-07-05 DIAGNOSIS — I350 Nonrheumatic aortic (valve) stenosis: Secondary | ICD-10-CM

## 2017-07-05 DIAGNOSIS — Z7984 Long term (current) use of oral hypoglycemic drugs: Secondary | ICD-10-CM | POA: Diagnosis not present

## 2017-07-05 DIAGNOSIS — I252 Old myocardial infarction: Secondary | ICD-10-CM | POA: Diagnosis not present

## 2017-07-05 DIAGNOSIS — E785 Hyperlipidemia, unspecified: Secondary | ICD-10-CM | POA: Diagnosis not present

## 2017-07-05 DIAGNOSIS — Z8249 Family history of ischemic heart disease and other diseases of the circulatory system: Secondary | ICD-10-CM | POA: Insufficient documentation

## 2017-07-05 DIAGNOSIS — I2510 Atherosclerotic heart disease of native coronary artery without angina pectoris: Secondary | ICD-10-CM | POA: Insufficient documentation

## 2017-07-05 DIAGNOSIS — Z87891 Personal history of nicotine dependence: Secondary | ICD-10-CM | POA: Insufficient documentation

## 2017-07-05 DIAGNOSIS — Z7902 Long term (current) use of antithrombotics/antiplatelets: Secondary | ICD-10-CM | POA: Diagnosis not present

## 2017-07-05 DIAGNOSIS — I10 Essential (primary) hypertension: Secondary | ICD-10-CM | POA: Diagnosis not present

## 2017-07-05 DIAGNOSIS — M1990 Unspecified osteoarthritis, unspecified site: Secondary | ICD-10-CM | POA: Insufficient documentation

## 2017-07-05 DIAGNOSIS — Z955 Presence of coronary angioplasty implant and graft: Secondary | ICD-10-CM | POA: Insufficient documentation

## 2017-07-05 DIAGNOSIS — Z7982 Long term (current) use of aspirin: Secondary | ICD-10-CM | POA: Insufficient documentation

## 2017-07-05 HISTORY — PX: RIGHT/LEFT HEART CATH AND CORONARY ANGIOGRAPHY: CATH118266

## 2017-07-05 LAB — POCT I-STAT 3, ART BLOOD GAS (G3+)
Acid-Base Excess: 3 mmol/L — ABNORMAL HIGH (ref 0.0–2.0)
Acid-base deficit: 2 mmol/L (ref 0.0–2.0)
Bicarbonate: 23.8 mmol/L (ref 20.0–28.0)
Bicarbonate: 28.2 mmol/L — ABNORMAL HIGH (ref 20.0–28.0)
O2 Saturation: 76 %
O2 Saturation: 95 %
TCO2: 25 mmol/L (ref 22–32)
TCO2: 30 mmol/L (ref 22–32)
pCO2 arterial: 41.3 mmHg (ref 32.0–48.0)
pCO2 arterial: 44.1 mmHg (ref 32.0–48.0)
pH, Arterial: 7.367 (ref 7.350–7.450)
pH, Arterial: 7.414 (ref 7.350–7.450)
pO2, Arterial: 41 mmHg — ABNORMAL LOW (ref 83.0–108.0)
pO2, Arterial: 81 mmHg — ABNORMAL LOW (ref 83.0–108.0)

## 2017-07-05 LAB — GLUCOSE, CAPILLARY: Glucose-Capillary: 234 mg/dL — ABNORMAL HIGH (ref 65–99)

## 2017-07-05 SURGERY — RIGHT/LEFT HEART CATH AND CORONARY ANGIOGRAPHY
Anesthesia: LOCAL

## 2017-07-05 MED ORDER — FENTANYL CITRATE (PF) 100 MCG/2ML IJ SOLN
INTRAMUSCULAR | Status: AC
  Filled 2017-07-05: qty 2

## 2017-07-05 MED ORDER — SODIUM CHLORIDE 0.9% FLUSH: 3.0000 mL | INTRAVENOUS | Status: DC | PRN

## 2017-07-05 MED ORDER — IOPAMIDOL (ISOVUE-370) INJECTION 76%
INTRAVENOUS | Status: AC
  Filled 2017-07-05: qty 100

## 2017-07-05 MED ORDER — FENTANYL CITRATE (PF) 100 MCG/2ML IJ SOLN
INTRAMUSCULAR | Status: DC | PRN
  Administered 2017-07-05: 25 ug via INTRAVENOUS

## 2017-07-05 MED ORDER — ONDANSETRON HCL 4 MG/2ML IJ SOLN: 4.0000 mg | Freq: Four times a day (QID) | INTRAMUSCULAR | Status: DC | PRN

## 2017-07-05 MED ORDER — IOPAMIDOL (ISOVUE-370) INJECTION 76%
INTRAVENOUS | Status: DC | PRN
  Administered 2017-07-05: 50 mL via INTRA_ARTERIAL

## 2017-07-05 MED ORDER — HEPARIN (PORCINE) IN NACL 2-0.9 UNIT/ML-% IJ SOLN
INTRAMUSCULAR | Status: AC | PRN
  Administered 2017-07-05: 1000 mL

## 2017-07-05 MED ORDER — SODIUM CHLORIDE 0.9 % WEIGHT BASED INFUSION: 1.0000 mL/kg/h | INTRAVENOUS | Status: DC

## 2017-07-05 MED ORDER — SODIUM CHLORIDE 0.9 % IV SOLN: 250.0000 mL | INTRAVENOUS | Status: DC | PRN

## 2017-07-05 MED ORDER — ACETAMINOPHEN 325 MG PO TABS: 650.0000 mg | ORAL_TABLET | ORAL | Status: DC | PRN

## 2017-07-05 MED ORDER — MIDAZOLAM HCL 2 MG/2ML IJ SOLN
INTRAMUSCULAR | Status: DC | PRN
  Administered 2017-07-05: 2 mg via INTRAVENOUS

## 2017-07-05 MED ORDER — SODIUM CHLORIDE 0.9% FLUSH: 3.0000 mL | Freq: Two times a day (BID) | INTRAVENOUS | Status: DC

## 2017-07-05 MED ORDER — VERAPAMIL HCL 2.5 MG/ML IV SOLN
INTRAVENOUS | Status: DC | PRN
  Administered 2017-07-05: 10 mL via INTRA_ARTERIAL

## 2017-07-05 MED ORDER — MIDAZOLAM HCL 2 MG/2ML IJ SOLN
INTRAMUSCULAR | Status: AC
  Filled 2017-07-05: qty 2

## 2017-07-05 MED ORDER — LIDOCAINE HCL 2 % IJ SOLN
INTRAMUSCULAR | Status: AC
  Filled 2017-07-05: qty 20

## 2017-07-05 MED ORDER — HEPARIN SODIUM (PORCINE) 1000 UNIT/ML IJ SOLN
INTRAMUSCULAR | Status: AC
  Filled 2017-07-05: qty 1

## 2017-07-05 MED ORDER — VERAPAMIL HCL 2.5 MG/ML IV SOLN
INTRAVENOUS | Status: AC
  Filled 2017-07-05: qty 2

## 2017-07-05 MED ORDER — HEPARIN SODIUM (PORCINE) 1000 UNIT/ML IJ SOLN
INTRAMUSCULAR | Status: DC | PRN
  Administered 2017-07-05: 4000 [IU] via INTRAVENOUS

## 2017-07-05 MED ORDER — SODIUM CHLORIDE 0.9 % WEIGHT BASED INFUSION
3.0000 mL/kg/h | INTRAVENOUS | Status: AC
  Administered 2017-07-05: 3 mL/kg/h via INTRAVENOUS

## 2017-07-05 MED ORDER — ASPIRIN 81 MG PO CHEW: 81.0000 mg | CHEWABLE_TABLET | ORAL | Status: DC

## 2017-07-05 MED ORDER — HEPARIN (PORCINE) IN NACL 2-0.9 UNIT/ML-% IJ SOLN
INTRAMUSCULAR | Status: AC
  Filled 2017-07-05: qty 1000

## 2017-07-05 MED ORDER — CLOPIDOGREL BISULFATE 75 MG PO TABS: 75.0000 mg | ORAL_TABLET | Freq: Every day | ORAL | Status: DC

## 2017-07-05 SURGICAL SUPPLY — 11 items
CATH BALLN WEDGE 5F 110CM (CATHETERS) ×2 IMPLANT
CATH IMPULSE 5F ANG/FL3.5 (CATHETERS) ×2 IMPLANT
DEVICE RAD COMP TR BAND LRG (VASCULAR PRODUCTS) ×2 IMPLANT
GLIDESHEATH SLEND SS 6F .021 (SHEATH) ×2 IMPLANT
GUIDEWIRE INQWIRE 1.5J.035X260 (WIRE) ×1 IMPLANT
INQWIRE 1.5J .035X260CM (WIRE) ×2
KIT HEART LEFT (KITS) ×2 IMPLANT
PACK CARDIAC CATHETERIZATION (CUSTOM PROCEDURE TRAY) ×2 IMPLANT
SHEATH GLIDE SLENDER 4/5FR (SHEATH) ×2 IMPLANT
TRANSDUCER W/STOPCOCK (MISCELLANEOUS) ×2 IMPLANT
TUBING CIL FLEX 10 FLL-RA (TUBING) ×2 IMPLANT

## 2017-07-05 NOTE — Discharge Instructions (Signed)

## 2017-07-05 NOTE — H&P (View-Only) (Signed)
Cardiology Office Note  Date: 06/23/2017   ID: Manuel Olson, DOB [birth year], MRN [id number]  PCP: Renee Rival, NP  Primary Cardiologist: Rozann Lesches, MD   Chief Complaint  Patient presents with  . Coronary Artery Disease  . Aortic Stenosis    History of Present Illness: Manuel Olson is a 71 y.o. male last seen in July. He is here today with his wife for a follow-up visit. He does not report any angina symptoms on medical therapy. He has NYHA class 2-3 dyspnea, but actually tends to limit his exertion in general. His wife also watches him closely with activities. He does not report any palpitations, orthopnea, or syncope.  I reviewed his medications which are outlined below. Cardiac regimen is stable. He reports no bleeding problems on aspirin and Plavix.  Recent follow-up echocardiogram is reviewed below. He has evidence of severe aortic stenosis with mean gradient 43 mmHg and peak gradient 72 mmHg. We discussed these results today. We also went over arranging further evaluation for aortic valve replacement. He has a long-standing history of heart murmur, although not clear that this was objectively evaluated until he established with our practice 6 months ago. Echocardiographic findings are relatively stable over the last 6 months.  Cardiac catheterization from March of this year in [place] is outlined below.  Past Medical History:  Diagnosis Date  . Aortic stenosis   . Arthritis   . CAD (coronary artery disease)    DES to mid LAD, DES to diagonal, DES x 2 to proximal and mid RCA - March 2018, [facility] [place] [facility] [place]  . Essential hypertension   . Hyperlipidemia   . Type 2 diabetes mellitus ([place])     Past Surgical History:  Procedure Laterality Date  . APPENDECTOMY    . EYE SURGERY Left   . HAND SURGERY Bilateral   . HERNIA REPAIR Right   . SHOULDER ARTHROSCOPY WITH ROTATOR CUFF REPAIR Left     Current Outpatient Prescriptions  Medication Sig  Dispense Refill  . aspirin EC 81 MG tablet Take 1 tablet (81 mg total) by mouth daily. 90 tablet 3  . atorvastatin (LIPITOR) 20 MG tablet Take 20 mg by mouth daily.    . carvedilol (COREG) 3.125 MG tablet Take 1 tablet (3.125 mg total) by mouth 2 (two) times daily with a meal. 60 tablet 6  . clopidogrel (PLAVIX) 75 MG tablet TK  1 T  DAILY  6  . glipiZIDE (GLUCOTROL) 5 MG tablet Take by mouth daily before breakfast.    . lisinopril (PRINIVIL,ZESTRIL) 10 MG tablet Take 1 tablet (10 mg total) by mouth daily. 30 tablet 6   No current facility-administered medications for this visit.    Allergies:  Patient has no known allergies.   Social History: The patient  reports that he quit smoking about 14 months ago. His smoking use included Cigarettes. He started smoking about 58 years ago. He has a 57.00 pack-year smoking history. He has never used smokeless tobacco. He reports that he does not drink alcohol or use drugs.   Family History: The patient's family history includes CAD in his brother; Hypertension in his brother; Lupus in his mother.   ROS:  Please see the history of present illness. Otherwise, complete review of systems is positive for none.  All other systems are reviewed and negative.   Physical Exam: VS:  BP (!) 142/76   Pulse 73   Ht 5' 10"  (1.778 m)   Wt 176 lb (  79.8 kg)   SpO2 95%   BMI 25.25 kg/m , BMI Body mass index is 25.25 kg/m.  Wt Readings from Last 3 Encounters:  06/23/17 176 lb (79.8 kg)  03/22/17 178 lb 9.6 oz (81 kg)  12/21/16 178 lb (80.7 kg)    General: Patient appears comfortable at rest. HEENT: Conjunctiva and lids normal, oropharynx clear. Neck: Supple, no elevated JVP, referred cardiac murmur to the right, no thyromegaly. Lungs: Clear to auscultation, nonlabored breathing at rest. Cardiac: Regular rate and rhythm, no S3, 3/6 systolic murmur with diminished second heart sound, no pericardial rub. Abdomen: Soft, nontender, bowel sounds present, no  guarding or rebound. Extremities: No pitting edema, distal pulses 2+. Skin: Warm and dry. Musculoskeletal: No kyphosis. Neuropsychiatric: Alert and oriented x3, affect grossly appropriate.  ECG: I personally reviewed the tracing from 12/21/2016 which shows sinus rhythm with poor R-wave progression rule out old anterior infarct pattern, nonspecific T-wave changes.  Recent Labwork: 03/16/2017: ALT 12; AST 15     Component Value Date/Time   CHOL 102 03/16/2017 0840   TRIG 71 03/16/2017 0840   HDL 36 (L) 03/16/2017 0840   CHOLHDL 2.8 03/16/2017 0840   VLDL 14 03/16/2017 0840   LDLCALC 52 03/16/2017 0840    Other Studies Reviewed Today:  Cardiac catheterization with PCI 11/11/2016 [facility]): Normal left main, 70% mid LAD stenosis, proximal first diagonal occluded, normal circumflex, large dominant RCA with 90% ulcerated proximal stenosis and 70-80% mid stenosis, LVEF 60% with LVEDP 20 mmHg and a 20 mm gradient across aortic valve. Patient underwent placement of Resolute OnyxDES to the mid LAD, diagonal, proximal RCA, and mid RCA.  Echocardiogram 06/19/2017: Study Conclusions  - Left ventricle: The cavity size was normal. There was mild   concentric hypertrophy. Systolic function was normal. The   estimated ejection fraction was in the range of 55% to 60%.   Doppler parameters are consistent with abnormal left ventricular   relaxation (grade 1 diastolic dysfunction). - Aortic valve: There was severe stenosis. There was mild   regurgitation. Valve area (VTI): 0.61 cm^2. Valve area (Vmax):   0.62 cm^2. Valve area (Vmean): 0.59 cm^2. - Mitral valve: Calcified annulus. Mildly thickened leaflets . - Left atrium: The atrium was mildly dilated. - Atrial septum: No defect or patent foramen ovale was identified.  Assessment and Plan:  1. Severe calcific aortic stenosis with mean gradient 43-49 mmHg. He has a long-standing history of heart murmur, heavily calcified valve  looks to be trileaflet. Describes NYHA class 2-3 dyspnea, but he does tend to minimize symptoms, and has been limiting his level of activity. Plan is to get him scheduled for a left and right heart catheterization with Dr. Burt Knack to initiate the process for considering AVR options.  2. Multivessel CAD status post anterior STEMI in March of this year, managed with multiple DES interventions in [facility] as outlined above. Coronary anatomy will be reassessed in the evaluation of his aortic stenosis. He continues on dual antiplatelet therapy. LVEF normal.  3. Hyperlipidemia, continues on Lipitor. LDL 52.  4. Tobacco abuse in remission.  Current medicines were reviewed with the patient today.   Orders Placed This Encounter  Procedures  . DG Chest 2 View  . CBC w/Diff/Platelet  . Basic Metabolic Panel (BMET)  . INR/PT    Disposition: Follow-up after cardiac catheterization.  Signed, Satira Sark, MD, [facility] 06/23/2017 10:11 AM    [facility] [place],  [place], [place] [address] Phone: [phone]; Fax: [phone]

## 2017-07-05 NOTE — Interval H&P Note (Signed)
History and Physical Interval Note:  07/05/2017 8:36 AM  Manuel Olson  has presented today for surgery, with the diagnosis of as    The various methods of treatment have been discussed with the patient and family. After consideration of risks, benefits and other options for treatment, the patient has consented to  Procedure(s): RIGHT/LEFT HEART CATH AND CORONARY ANGIOGRAPHY (N/A) as a surgical intervention .  The patient's history has been reviewed, patient examined, no change in status, stable for surgery.  I have reviewed the patient's chart and labs.  Questions were answered to the patient's satisfaction.     [name]

## 2017-07-06 ENCOUNTER — Other Ambulatory Visit: Payer: Self-pay | Admitting: Cardiology

## 2017-07-06 ENCOUNTER — Other Ambulatory Visit: Payer: Self-pay

## 2017-07-06 DIAGNOSIS — I350 Nonrheumatic aortic (valve) stenosis: Secondary | ICD-10-CM

## 2017-07-06 MED ORDER — CLOPIDOGREL BISULFATE 75 MG PO TABS: 75.0000 mg | ORAL_TABLET | Freq: Every day | ORAL | 0 refills | Status: DC

## 2017-07-06 MED FILL — Lidocaine HCl Local Inj 2%: INTRAMUSCULAR | Qty: 10 | Status: AC

## 2017-07-06 NOTE — Telephone Encounter (Signed)
Refill complete 

## 2017-07-06 NOTE — Telephone Encounter (Signed)
°*  STAT* If patient is at the pharmacy, call can be transferred to refill team.   1. Which medications need to be refilled? (please list name of each medication and dose if known)  clopidogrel (PLAVIX) 75 MG tablet [[id number]]   2. Which pharmacy/location (including street and city if local pharmacy) is medication to be sent to? [facility] [place]  3. Do they need a 30 day or 90 day supply?  90 day  Scheduled to see SM on 11/14

## 2017-07-07 MED FILL — Lidocaine HCl Local Inj 2%: INTRAMUSCULAR | Qty: 20 | Status: AC

## 2017-07-20 ENCOUNTER — Ambulatory Visit (HOSPITAL_COMMUNITY)
Admission: RE | Admit: 2017-07-20 | Discharge: 2017-07-20 | Disposition: A | Discharge status: Home / Self Care | Payer: Medicare Other | Source: Ambulatory Visit | Attending: Cardiovascular Disease | Admitting: Cardiovascular Disease

## 2017-07-20 ENCOUNTER — Encounter (HOSPITAL_COMMUNITY): Payer: Medicare Other

## 2017-07-20 ENCOUNTER — Ambulatory Visit (HOSPITAL_COMMUNITY): Payer: Medicare Other

## 2017-07-20 DIAGNOSIS — I714 Abdominal aortic aneurysm, without rupture: Secondary | ICD-10-CM | POA: Diagnosis not present

## 2017-07-20 DIAGNOSIS — Q2732 Arteriovenous malformation of vessel of lower limb: Secondary | ICD-10-CM | POA: Diagnosis not present

## 2017-07-20 DIAGNOSIS — K869 Disease of pancreas, unspecified: Secondary | ICD-10-CM | POA: Insufficient documentation

## 2017-07-20 DIAGNOSIS — J439 Emphysema, unspecified: Secondary | ICD-10-CM | POA: Diagnosis not present

## 2017-07-20 DIAGNOSIS — I350 Nonrheumatic aortic (valve) stenosis: Secondary | ICD-10-CM

## 2017-07-20 DIAGNOSIS — M4856XA Collapsed vertebra, not elsewhere classified, lumbar region, initial encounter for fracture: Secondary | ICD-10-CM | POA: Diagnosis not present

## 2017-07-20 DIAGNOSIS — I700 Atherosclerosis of aorta: Secondary | ICD-10-CM | POA: Insufficient documentation

## 2017-07-20 DIAGNOSIS — N400 Enlarged prostate without lower urinary tract symptoms: Secondary | ICD-10-CM | POA: Insufficient documentation

## 2017-07-20 DIAGNOSIS — K5730 Diverticulosis of large intestine without perforation or abscess without bleeding: Secondary | ICD-10-CM | POA: Insufficient documentation

## 2017-07-20 DIAGNOSIS — I719 Aortic aneurysm of unspecified site, without rupture: Secondary | ICD-10-CM | POA: Insufficient documentation

## 2017-07-20 DIAGNOSIS — N289 Disorder of kidney and ureter, unspecified: Secondary | ICD-10-CM | POA: Insufficient documentation

## 2017-07-20 DIAGNOSIS — R918 Other nonspecific abnormal finding of lung field: Secondary | ICD-10-CM | POA: Diagnosis not present

## 2017-07-20 MED ORDER — IOPAMIDOL (ISOVUE-370) INJECTION 76%
INTRAVENOUS | Status: AC
  Administered 2017-07-20: 100 mL
  Filled 2017-07-20: qty 100

## 2017-07-21 ENCOUNTER — Ambulatory Visit (HOSPITAL_COMMUNITY)
Admission: RE | Admit: 2017-07-21 | Discharge: 2017-07-21 | Disposition: A | Discharge status: Home / Self Care | Payer: Medicare Other | Source: Ambulatory Visit | Attending: Cardiovascular Disease | Admitting: Cardiovascular Disease

## 2017-07-21 DIAGNOSIS — I350 Nonrheumatic aortic (valve) stenosis: Secondary | ICD-10-CM | POA: Diagnosis not present

## 2017-07-21 LAB — PULMONARY FUNCTION TEST
DL/VA % pred: 66 %
DL/VA: 3.03 ml/min/mmHg/L
DLCO unc % pred: 56 %
DLCO unc: 18.32 ml/min/mmHg
FEF 25-75 Post: 1.16 L/sec
FEF 25-75 Pre: 0.89 L/sec
FEF2575-%Change-Post: 30 %
FEF2575-%Pred-Post: 50 %
FEF2575-%Pred-Pre: 38 %
FEV1-%Change-Post: 11 %
FEV1-%Pred-Post: 75 %
FEV1-%Pred-Pre: 67 %
FEV1-Post: 2.35 L
FEV1-Pre: 2.11 L
FEV1FVC-%Change-Post: 4 %
FEV1FVC-%Pred-Pre: 77 %
FEV6-%Change-Post: 6 %
FEV6-%Pred-Post: 90 %
FEV6-%Pred-Pre: 85 %
FEV6-Post: 3.65 L
FEV6-Pre: 3.43 L
FEV6FVC-%Change-Post: 0 %
FEV6FVC-%Pred-Post: 97 %
FEV6FVC-%Pred-Pre: 97 %
FVC-%Change-Post: 6 %
FVC-%Pred-Post: 92 %
FVC-%Pred-Pre: 87 %
FVC-Post: 3.97 L
FVC-Pre: 3.73 L
Post FEV1/FVC ratio: 59 %
Post FEV6/FVC ratio: 92 %
Pre FEV1/FVC ratio: 57 %
Pre FEV6/FVC Ratio: 92 %
RV % pred: 136 %
RV: 3.45 L
TLC % pred: 104 %
TLC: 7.34 L

## 2017-07-21 MED ORDER — ALBUTEROL SULFATE (2.5 MG/3ML) 0.083% IN NEBU
2.5000 mg | INHALATION_SOLUTION | Freq: Once | RESPIRATORY_TRACT | Status: AC
  Administered 2017-07-21: 2.5 mg via RESPIRATORY_TRACT

## 2017-07-24 ENCOUNTER — Encounter: Payer: Self-pay | Admitting: Thoracic Surgery (Cardiothoracic Vascular Surgery)

## 2017-07-24 ENCOUNTER — Institutional Professional Consult (permissible substitution) (INDEPENDENT_AMBULATORY_CARE_PROVIDER_SITE_OTHER): Payer: Medicare Other | Admitting: Thoracic Surgery (Cardiothoracic Vascular Surgery)

## 2017-07-24 ENCOUNTER — Telehealth: Payer: Self-pay | Admitting: Gastroenterology

## 2017-07-24 ENCOUNTER — Other Ambulatory Visit: Payer: Self-pay

## 2017-07-24 VITALS — BP 132/75 | HR 72 | Resp 16 | Ht 70.0 in | Wt 178.0 lb

## 2017-07-24 DIAGNOSIS — K862 Cyst of pancreas: Secondary | ICD-10-CM

## 2017-07-24 DIAGNOSIS — Z955 Presence of coronary angioplasty implant and graft: Secondary | ICD-10-CM

## 2017-07-24 DIAGNOSIS — N2889 Other specified disorders of kidney and ureter: Secondary | ICD-10-CM | POA: Diagnosis not present

## 2017-07-24 DIAGNOSIS — I252 Old myocardial infarction: Secondary | ICD-10-CM

## 2017-07-24 DIAGNOSIS — I25119 Atherosclerotic heart disease of native coronary artery with unspecified angina pectoris: Secondary | ICD-10-CM

## 2017-07-24 DIAGNOSIS — I350 Nonrheumatic aortic (valve) stenosis: Secondary | ICD-10-CM

## 2017-07-24 NOTE — Telephone Encounter (Signed)
Please schedule with APP any time or first available MD.  Can put in late pm APP slot.

## 2017-07-24 NOTE — Telephone Encounter (Signed)
Please see Epic referral and advise on scheduling. 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 Dr. Fuller Plan is Doc of the Day for 07/24/17

## 2017-07-24 NOTE — Progress Notes (Signed)
HE[facility] [place] [facility] CONSULTATION REPORT  Referring Provider is [name], MD PCP is Ahmed Prima Laurita Quint, NP  Chief Complaint  Patient presents with  . Aortic Stenosis    SEVERE..eval for TAVR vs. AVR.[name] 10/24, ECHO 10/8, CTA C/A/P/HEART 11/8, PFT 119/ 18    HPI:  Patient is a 71 year old male with history of aortic stenosis, coronary artery disease status post acute ST segment elevation myocardial infarction in March 2018 treated with multivessel PCI and stenting, hypertension, hyperlipidemia, long-standing tobacco abuse, and type 2 diabetes mellitus who has been referred for surgical consultation to discuss treatment options for management of severe symptomatic aortic stenosis.  Patient states that he was told he had a heart murmur during childhood.  He was never evaluated by a cardiologist and reported no history of cardiac disease until March of this year when he suffered an acute ST segment elevation myocardial infarction.  At the time he was traveling in [place] and was treated at [facility].  He was found to have 70% stenosis of the mid left anterior descending coronary artery, occlusion of a large diagonal branch, and 90% proximal right coronary artery with 60-70% stenosis of mid right coronary artery.  Left ventricular function was reportedly preserved and he was treated with multivessel PCI and stenting using drug eluting stents in the mid left anterior descending coronary artery, the diagonal branch, the proximal and the mid right coronary artery.  At the time he was told that he also had aortic stenosis and he was instructed to seek follow-up with a cardiologist as soon as possible when he returned to [name].  He was subsequently evaluated by Dr. Domenic Polite in April, just 6 weeks after his acute myocardial infarction.  At that time he was doing fairly well and he quit smoking.   A follow-up echocardiogram was performed in revealed normal left ventricular systolic function with ejection fraction estimated 55-60%.  There was severe aortic stenosis with peak velocity across aortic valve measured 4.5 m/s corresponding to a mean transvalvular gradient estimated 49 mmHg.  The DVI was reported 0.20.  He was seen in follow-up by Dr. Domenic Polite in early October.  Although the patient continued to do fairly well he reported symptoms of exertional shortness of breath with more strenuous physical exertion.  Follow-up echocardiogram looked similar to the previous echocardiogram and revealed severe aortic stenosis with preserved left ventricular systolic function.  The patient was referred for diagnostic cardiac catheterization which was performed by Dr. Burt Knack on July 05, 2017.  Catheterization confirmed the presence of severe aortic stenosis with peak to peak and mean transvalvular gradients across the aortic valve measured at 42 and 33 mmHg respectively.  The patient subsequently underwent CT angiography and cardiothoracic surgical consultation was requested.  Patient is married and lives locally in [name] with his wife.  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  They have 4 grown children.  Patient has remained active during his retirement and enjoys traveling for doing Scientist, research (life sciences).  He reports that he has had no physical problems whatsoever until his heart attack last spring.  He states that he recovered from this fairly well although he still experiences symptoms of exertional shortness of breath.  Symptoms are typically brought on with more strenuous physical exertion but they have gotten to the point where they limit his physical activity to some degree.  He  does not get shortness of breath with low level activity and he has never had any resting shortness of breath since his heart attack.  He does occasionally get  tightness across his chest but only with more strenuous exertion.  He has not had any palpitations, dizzy spells, or syncope.  He has never had any symptoms of PND, orthopnea, or lower extremity edema.  He reports excellent appetite and he has not been losing weight.  He denies any history of abdominal pain or pain radiating to his back.  He does complain of some problems with short-term memory loss but this has not been problematic.  He has not had transient visual disturbances or headaches.    Past Medical History:  Diagnosis Date  . Aortic stenosis   . Arthritis   . CAD (coronary artery disease)    DES to mid LAD, DES to diagonal, DES x 2 to proximal and mid RCA - March 2018, [facility]  . Essential hypertension   . Hyperlipidemia   . Type 2 diabetes mellitus ([place])     Past Surgical History:  Procedure Laterality Date  . APPENDECTOMY    . EYE SURGERY Left   . HAND SURGERY Bilateral   . HERNIA REPAIR Right   . SHOULDER ARTHROSCOPY WITH ROTATOR CUFF REPAIR Left     Family History  Problem Relation Age of Onset  . Lupus Mother   . CAD Brother   . Hypertension Brother     Social History   Socioeconomic History  . Marital status: Married    Spouse name: Not on file  . Number of children: Not on file  . Years of education: Not on file  . Highest education level: Not on file  Social Needs  . Financial resource strain: Not on file  . Food insecurity - worry: Not on file  . Food insecurity - inability: Not on file  . Transportation needs - medical: Not on file  . Transportation needs - non-medical: Not on file  Occupational History  . Not on file  Tobacco Use  . Smoking status: Former Smoker    Packs/day: 1.00    Years: 57.00    Pack years: 57.00    Types: Cigarettes    Start date: 09/12/1958    Last attempt to quit: 04/12/2016    Years since quitting: 1.2  . Smokeless tobacco: Never Used  Substance and Sexual Activity  . Alcohol use: No  . Drug use: No  .  Sexual activity: Not on file  Other Topics Concern  . Not on file  Social History Narrative  . Not on file    Current Outpatient Medications  Medication Sig Dispense Refill  . aspirin EC 81 MG tablet Take 1 tablet (81 mg total) by mouth daily. 90 tablet 3  . atorvastatin (LIPITOR) 20 MG tablet Take 20 mg by mouth daily.    . carvedilol (COREG) 3.125 MG tablet Take 1 tablet (3.125 mg total) by mouth 2 (two) times daily with a meal. 60 tablet 6  . clopidogrel (PLAVIX) 75 MG tablet Take 1 tablet (75 mg total) by mouth daily. 90 tablet 0  . glipiZIDE (GLUCOTROL) 5 MG tablet Take 5 mg by mouth daily before breakfast.     . lisinopril (PRINIVIL,ZESTRIL) 10 MG tablet Take 10 mg by mouth daily.    [name] lisinopril (PRINIVIL,ZESTRIL) 10 MG tablet Take 1 tablet (10 mg total) by mouth daily. 30 tablet 6   No current facility-administered medications for this visit.  No Known Allergies    Review of Systems:   General:  normal appetite, decreased energy, no weight gain, no weight loss, no fever  Cardiac:  + chest pain with exertion, no chest pain at rest, + SOB with exertion, no resting SOB, no PND, no orthopnea, no palpitations, no arrhythmia, no atrial fibrillation, no LE edema, no dizzy spells, no syncope  Respiratory:   exertional shortness of breath, no home oxygen, no productive cough, + dry cough, no bronchitis, no wheezing, no hemoptysis, no asthma, no pain with inspiration or cough, no sleep apnea, no CPAP at night  GI:   no difficulty swallowing, no reflux, + frequent heartburn over the past 6-8 months, no hiatal hernia, no abdominal pain, no constipation, no diarrhea, no hematochezia, no hematemesis, no melena  GU:   no dysuria,  no frequency, no urinary tract infection, no hematuria, no enlarged prostate, no kidney stones, no kidney disease  Vascular:  no pain suggestive of claudication, no pain in feet, no leg cramps, no varicose veins, no DVT, no non-healing foot ulcer  Neuro:   no  stroke, no TIA's, no seizures, no headaches, no temporary blindness one eye,  no slurred speech, no peripheral neuropathy, no chronic pain, no instability of gait, + memory/cognitive dysfunction  Musculoskeletal: + arthritis in hands and back, no joint swelling, no myalgias, no difficulty walking, normal mobility   Skin:   no rash, no itching, no skin infections, no pressure sores or ulcerations  Psych:   no anxiety, no depression, no nervousness, no unusual recent stress  Eyes:   no blurry vision, no floaters, no recent vision changes, + wears glasses or contacts  ENT:   + hearing loss, no loose or painful teeth, edentulous with full dentures  Hematologic:  + easy bruising, no abnormal bleeding, no clotting disorder, no frequent epistaxis  Endocrine:  + diabetes, does not check CBG's at home           Physical Exam:   BP 132/75 (BP Location: Right Arm, Patient Position: Sitting, Cuff Size: Large)   Pulse 72   Resp 16   Ht 5' 10"  (1.778 m)   Wt 178 lb (80.7 kg)   SpO2 94% Comment: ON RA  BMI 25.54 kg/m   General:  Thin,  well-appearing  HEENT:  Unremarkable   Neck:   no JVD, no bruits, no adenopathy   Chest:   clear to auscultation, symmetrical breath sounds, no wheezes, no rhonchi   CV:   RRR, grade IV/VI crescendo/decrescendo murmur heard best at sternal border,  no diastolic murmur  Abdomen:  soft, mildly tender left upper quadrant, + BS's  Extremities:  warm, well-perfused, pulses palpable, no LE edema  Rectal/GU  Deferred  Neuro:   Grossly non-focal and symmetrical throughout  Skin:   Clean and dry, no rashes, no breakdown   Diagnostic Tests:  Transthoracic Echocardiography  Patient:    Divit, Stipp MR #:       [id number] Study Date: 12/30/2016 Gender:     M Age:        60 Height:     175.3 cm Weight:     80.7 kg BSA:        2 m^2 Pt. Status: Room:   ATTENDING    Rozann Lesches, M.D.  ORDERING     Rozann Lesches, M.D.  REFERRING    Rozann Lesches,  M.D.  SONOGRAPHER  [facility]  PERFORMING   Chmg, Blaine Penn  cc:  ------------------------------------------------------------------- LV  EF: 55% -   60%  ------------------------------------------------------------------- Indications:      Murmur 785.2.  ------------------------------------------------------------------- History:   PMH:  Murmur since childhood.  Murmur.  Coronary artery disease.  PMH:   Myocardial infarction.  Risk factors: Hypertension. Diabetes mellitus. Dyslipidemia.  ------------------------------------------------------------------- Study Conclusions  - Left ventricle: The cavity size was normal. Wall thickness was   increased in a pattern of moderate LVH. Systolic function was   normal. The estimated ejection fraction was in the range of 55%   to 60%. Wall motion was normal; there were no regional wall   motion abnormalities. Doppler parameters are consistent with   abnormal left ventricular relaxation (grade 1 diastolic   dysfunction). - Aortic valve: Moderately calcified annulus. Severely thickened   leaflets. There was severe stenosis. Mean gradient (S): 49 mm Hg.   Valve area (VTI): 0.68 cm^2. Valve area (Vmax): 0.62 cm^2. Valve   area (Vmean): 0.63 cm^2. - Mitral valve: Mildly calcified annulus. Mildly thickened leaflets   . - Atrial septum: No defect or patent foramen ovale was identified. - Technically adequate study.  ------------------------------------------------------------------- Study data:  No prior study was available for comparison.  Study status:  Routine.  Procedure:  Transthoracic echocardiography. Image quality was adequate.          Transthoracic echocardiography.  M-mode, complete 2D, spectral Doppler, and color Doppler.  Birthdate:  Patient birthdate: [birth year].  Age:  Patient is 71 yr old.  Sex:  Gender: male.    BMI: 26.3 kg/m^2.  Patient status:  Outpatient.  Study date:  Study date: 12/30/2016. Study time:  10:33 AM.  Location:  Echo laboratory.  -------------------------------------------------------------------  ------------------------------------------------------------------- Left ventricle:  The cavity size was normal. Wall thickness was increased in a pattern of moderate LVH. Systolic function was normal. The estimated ejection fraction was in the range of 55% to 60%. Wall motion was normal; there were no regional wall motion abnormalities. Doppler parameters are consistent with abnormal left ventricular relaxation (grade 1 diastolic dysfunction). There was no evidence of elevated ventricular filling pressure by Doppler parameters.  ------------------------------------------------------------------- Aortic valve:   Moderately calcified annulus. Severely thickened leaflets. Uncertain number of leaflets.  Doppler:   There was severe stenosis.   There was no significant regurgitation.    VTI ratio of LVOT to aortic valve: 0.2. Valve area (VTI): 0.68 cm^2. Indexed valve area (VTI): 0.34 cm^2/m^2. Peak velocity ratio of LVOT to aortic valve: 0.18. Valve area (Vmax): 0.62 cm^2. Indexed valve area (Vmax): 0.31 cm^2/m^2. Mean velocity ratio of LVOT to aortic valve: 0.18. Valve area (Vmean): 0.63 cm^2. Indexed valve area (Vmean): 0.31 cm^2/m^2.    Mean gradient (S): 49 mm Hg. Peak gradient (S): 82 mm Hg.  ------------------------------------------------------------------- Aorta:  Aortic root: The aortic root was normal in size.  ------------------------------------------------------------------- Mitral valve:   Mildly calcified annulus. Mildly thickened leaflets .  Doppler:   There was no evidence for stenosis.   There was no significant regurgitation.  ------------------------------------------------------------------- Left atrium:  The atrium was normal in size.  ------------------------------------------------------------------- Atrial septum:  No defect or patent foramen  ovale was identified.   ------------------------------------------------------------------- Right ventricle:  The cavity size was normal. Systolic function was normal.  ------------------------------------------------------------------- Pulmonic valve:   Not well visualized.  Doppler:   There was no evidence for stenosis.   There was no significant regurgitation.   ------------------------------------------------------------------- Tricuspid valve:   Normal thickness leaflets.  Doppler:   There was no evidence for stenosis.   There was no significant regurgitation.   -------------------------------------------------------------------  Right atrium:  The atrium was normal in size.  ------------------------------------------------------------------- Pericardium:  There was no pericardial effusion.  ------------------------------------------------------------------- Systemic veins: Inferior vena cava: The vessel was normal in size. The respirophasic diameter changes were in the normal range (>= 50%), consistent with normal central venous pressure.  ------------------------------------------------------------------- Measurements   Left ventricle                            Value          Reference  LV ID, ED, PLAX chordal                   47.7  mm       43 - 52  LV ID, ES, PLAX chordal                   33.3  mm       23 - 38  LV fx shortening, PLAX chordal            30    %        >=29  LV PW thickness, ED                       14    mm       ---------  IVS/LV PW ratio, ED                       1              <=1.3  Stroke volume, 2D                         58    ml       ---------  Stroke volume/bsa, 2D                     29    ml/m^2   ---------  LV e&', lateral                            6.64  cm/s     ---------  LV E/e&', lateral                          9.56           ---------  LV e&', medial                             4.46  cm/s     ---------  LV E/e&', medial                            14.24          ---------  LV e&', average                            5.55  cm/s     ---------  LV E/e&', average                          11.44          ---------    Ventricular septum  Value          Reference  IVS thickness, ED                         14    mm       ---------    LVOT                                      Value          Reference  LVOT ID, S                                21    mm       ---------  LVOT area                                 3.46  cm^2     ---------  LVOT peak velocity, S                     81.3  cm/s     ---------  LVOT mean velocity, S                     59.6  cm/s     ---------  LVOT VTI, S                               20.3  cm       ---------  LVOT peak gradient, S                     3     mm Hg    ---------    Aortic valve                              Value          Reference  Aortic valve peak velocity, S             454   cm/s     ---------  Aortic valve mean velocity, S             329   cm/s     ---------  Aortic valve VTI, S                       104   cm       ---------  Aortic mean gradient, S                   49    mm Hg    ---------  Aortic peak gradient, S                   82    mm Hg    ---------  VTI ratio, LVOT/AV                        0.2            ---------  Aortic valve area, VTI  0.68  cm^2     ---------  Aortic valve area/bsa, VTI                0.34  cm^2/m^2 ---------  Velocity ratio, peak, LVOT/AV             0.18           ---------  Aortic valve area, peak velocity          0.62  cm^2     ---------  Aortic valve area/bsa, peak               0.31  cm^2/m^2 ---------  velocity  Velocity ratio, mean, LVOT/AV             0.18           ---------  Aortic valve area, mean velocity          0.63  cm^2     ---------  Aortic valve area/bsa, mean               0.31  cm^2/m^2 ---------  velocity    Aorta                                     Value          Reference   Aortic root ID, ED                        31    mm       ---------    Left atrium                               Value          Reference  LA ID, A-P, ES                            28    mm       ---------  LA volume/bsa, ES, 1-p A4C                27.3  ml/m^2   ---------    Mitral valve                              Value          Reference  Mitral E-wave peak velocity               63.5  cm/s     ---------  Mitral A-wave peak velocity               105   cm/s     ---------  Mitral deceleration time          (H)     405   ms       150 - 230  Mitral E/A ratio, peak                    0.6            ---------    Right ventricle                           Value  Reference  TAPSE                                     23    mm       ---------  RV s&', lateral, S                         11.9  cm/s     ---------  Legend: (L)  and  (H)  mark values outside specified reference range.  ------------------------------------------------------------------- Prepared and Electronically Authenticated by  Kerry Hough, M.D. 2018-04-20T12:56:36   Transthoracic Echocardiography  Patient:    Manuel Olson, Manuel Olson MR #:       [id number] Study Date: 06/19/2017 Gender:     M Age:        31 Height:     175.3 cm Weight:     81 kg BSA:        2 m^2 Pt. Status: Room:   ATTENDING    Rozann Lesches, M.D.  ORDERING     Rozann Lesches, M.D.  REFERRING    Rozann Lesches, M.D.  PERFORMING   Chmg, Forestine Na  SONOGRAPHER  Alvino Chapel, RCS  cc:  ------------------------------------------------------------------- LV EF: 55% -   60%  ------------------------------------------------------------------- Indications:      Aortic stenosis 424.1.  ------------------------------------------------------------------- History:   PMH:  Acquired from the patient and from the patient&'s chart.  Coronary artery disease.  PMH:  Murmur since childhood. Myocardial infarction.  Risk factors:   Hypertension. Diabetes mellitus. Dyslipidemia.  ------------------------------------------------------------------- Study Conclusions  - Left ventricle: The cavity size was normal. There was mild   concentric hypertrophy. Systolic function was normal. The   estimated ejection fraction was in the range of 55% to 60%.   Doppler parameters are consistent with abnormal left ventricular   relaxation (grade 1 diastolic dysfunction). - Aortic valve: There was severe stenosis. There was mild   regurgitation. Valve area (VTI): 0.61 cm^2. Valve area (Vmax):   0.62 cm^2. Valve area (Vmean): 0.59 cm^2. - Mitral valve: Calcified annulus. Mildly thickened leaflets . - Left atrium: The atrium was mildly dilated. - Atrial septum: No defect or patent foramen ovale was identified.  ------------------------------------------------------------------- Study data:  Comparison was made to the study of 12/30/2016.  Study status:  Routine.  Procedure:  The patient reported no pain pre or post test. Transthoracic echocardiography. Image quality was adequate.  Study completion:  There were no complications. Transthoracic echocardiography.  M-mode, complete 2D, spectral Doppler, and color Doppler.  Birthdate:  Patient birthdate: 1946-08-08.  Age:  Patient is 71 yr old.  Sex:  Gender: male. BMI: 26.4 kg/m^2.  Blood pressure:     164/78  Patient status: Inpatient.  Study date:  Study date: 06/19/2017. Study time: 09:26 AM.  Location:  Echo laboratory.  -------------------------------------------------------------------  ------------------------------------------------------------------- Left ventricle:  The cavity size was normal. There was mild concentric hypertrophy. Systolic function was normal. The estimated ejection fraction was in the range of 55% to 60%. Doppler parameters are consistent with abnormal left ventricular relaxation (grade 1 diastolic  dysfunction).  ------------------------------------------------------------------- Aortic valve:   Trileaflet; severely thickened, severely calcified leaflets.  Doppler:   There was severe stenosis.   There was mild regurgitation.    VTI ratio of LVOT to aortic valve: 0.21. Valve area (VTI): 0.61 cm^2. Indexed valve area (VTI): 0.31 cm^2/m^2. Peak velocity ratio of LVOT to aortic valve: 0.22.  Valve area (Vmax): 0.62 cm^2. Indexed valve area (Vmax): 0.31 cm^2/m^2. Mean velocity ratio of LVOT to aortic valve: 0.21. Valve area (Vmean): 0.59 cm^2. Indexed valve area (Vmean): 0.29 cm^2/m^2.    Mean gradient (S): 43 mm Hg. Peak gradient (S): 72 mm Hg.  ------------------------------------------------------------------- Aorta:  The aorta was normal, not dilated, and non-diseased.  ------------------------------------------------------------------- Mitral valve:   Calcified annulus. Mildly thickened leaflets . Doppler:  There was trivial regurgitation.  ------------------------------------------------------------------- Left atrium:  The atrium was mildly dilated.  ------------------------------------------------------------------- Atrial septum:  No defect or patent foramen ovale was identified.   ------------------------------------------------------------------- Right ventricle:  The cavity size was normal. Wall thickness was normal. Systolic function was normal.  ------------------------------------------------------------------- Tricuspid valve:   Doppler:  There was mild regurgitation.  ------------------------------------------------------------------- Right atrium:  The atrium was normal in size.  ------------------------------------------------------------------- Pericardium:  The pericardium was normal in appearance.  ------------------------------------------------------------------- Systemic veins: Inferior vena cava: The vessel was dilated. The  respirophasic diameter changes were blunted (< 50%), consistent with elevated central venous pressure.  ------------------------------------------------------------------- Post procedure conclusions Ascending Aorta:  - The aorta was normal, not dilated, and non-diseased.  ------------------------------------------------------------------- Measurements   Left ventricle                           Value          Reference  LV ID, ED, PLAX chordal          (H)     53.1  mm       43 - 52  LV ID, ES, PLAX chordal          (H)     38.1  mm       23 - 38  LV fx shortening, PLAX chordal   (L)     28    %        >=29  LV PW thickness, ED                      14.1  mm       ----------  IVS/LV PW ratio, ED                      0.91           <=1.3  Stroke volume, 2D                        67    ml       ----------  Stroke volume/bsa, 2D                    34    ml/m^2   ----------  LV ejection fraction, 1-p A4C            53    %        ----------  LV end-diastolic volume, 2-p             94    ml       ----------  LV end-systolic volume, 2-p              45    ml       ----------  LV ejection fraction, 2-p                52    %        ----------  Stroke volume, 2-p  49    ml       ----------  LV end-diastolic volume/bsa, 2-p         47    ml/m^2   ----------  LV end-systolic volume/bsa, 2-p          22    ml/m^2   ----------  Stroke volume/bsa, 2-p                   24.6  ml/m^2   ----------  LV e&', lateral                           4.35  cm/s     ----------  LV E/e&', lateral                         14.39          ----------  LV e&', medial                            3.48  cm/s     ----------  LV E/e&', medial                          17.99          ----------  LV e&', average                           3.92  cm/s     ----------  LV E/e&', average                         15.99          ----------    Ventricular septum                       Value          Reference   IVS thickness, ED                        12.8  mm       ----------    LVOT                                     Value          Reference  LVOT ID, S                               19    mm       ----------  LVOT area                                2.84  cm^2     ----------  LVOT peak velocity, S                    93.3  cm/s     ----------  LVOT mean velocity, S                    62.6  cm/s     ----------  LVOT VTI, S  23.7  cm       ----------  LVOT peak gradient, S                    3     mm Hg    ----------    Aortic valve                             Value          Reference  Aortic valve peak velocity, S            425   cm/s     ----------  Aortic valve mean velocity, S            302   cm/s     ----------  Aortic valve VTI, S                      111   cm       ----------  Aortic mean gradient, S                  43    mm Hg    ----------  Aortic peak gradient, S                  72    mm Hg    ----------  VTI ratio, LVOT/AV                       0.21           ----------  Aortic valve area, VTI                   0.61  cm^2     ----------  Aortic valve area/bsa, VTI               0.31  cm^2/m^2 ----------  Velocity ratio, peak, LVOT/AV            0.22           ----------  Aortic valve area, peak velocity         0.62  cm^2     ----------  Aortic valve area/bsa, peak              0.31  cm^2/m^2 ----------  velocity  Velocity ratio, mean, LVOT/AV            0.21           ----------  Aortic valve area, mean velocity         0.59  cm^2     ----------  Aortic valve area/bsa, mean              0.29  cm^2/m^2 ----------  velocity  Aortic regurg pressure half-time         388   ms       ----------    Aorta                                    Value          Reference  Aortic root ID, ED                       32    mm       ----------    Left atrium  Value          Reference  LA ID, A-P, ES                           38    mm        ----------  LA ID/bsa, A-P                           1.9   cm/m^2   <=2.2  LA volume, S                             56.4  ml       ----------  LA volume/bsa, S                         28.2  ml/m^2   ----------  LA volume, ES, 1-p A4C                   61.4  ml       ----------  LA volume/bsa, ES, 1-p A4C               30.7  ml/m^2   ----------  LA volume, ES, 1-p A2C                   51.8  ml       ----------  LA volume/bsa, ES, 1-p A2C               25.9  ml/m^2   ----------    Mitral valve                             Value          Reference  Mitral E-wave peak velocity              62.6  cm/s     ----------  Mitral A-wave peak velocity              116   cm/s     ----------  Mitral deceleration time         (H)     408   ms       150 - 230  Mitral E/A ratio, peak                   0.5            ----------    Pulmonary arteries                       Value          Reference  PA pressure, S, DP                       28    mm Hg    <=30    Tricuspid valve                          Value          Reference  Tricuspid regurg peak velocity           225   cm/s     ----------  Tricuspid peak RV-RA gradient  20    mm Hg    ----------    Right atrium                             Value          Reference  RA ID, S-I, ES, A4C              (H)     50.6  mm       34 - 49  RA area, ES, A4C                         12.9  cm^2     8.3 - 19.5  RA volume, ES, A/L                       28.1  ml       ----------  RA volume/bsa, ES, A/L                   14.1  ml/m^2   ----------    Systemic veins                           Value          Reference  Estimated CVP                            8     mm Hg    ----------    Right ventricle                          Value          Reference  RV ID, ED, PLAX                          23.3  mm       19 - 38  TAPSE                                    19.9  mm       ----------  RV pressure, S, DP                       28    mm Hg    <=30  RV s&', lateral,  S                        10.4  cm/s     ----------  Legend: (L)  and  (H)  mark values outside specified reference range.  ------------------------------------------------------------------- Prepared and Electronically Authenticated by  Jenkins Rouge, M.D. 2018-10-08T11:48:52   RIGHT/LEFT HEART CATH AND CORONARY ANGIOGRAPHY  Conclusion   1. Multivessel coronary artery disease with continued patency of the stented segments in the LAD, first diagonal branch, and mid RCA 2. Mild diffuse nonobstructive residual CAD as detailed above 3. Normal right heart hemodynamics 4. Severe aortic stenosis with a peak to peak gradient of 42 mmHg and a mean gradient of 33 mmHg  Recommendations: Continue multidisciplinary heart team evaluation for severe symptomatic aortic stenosis. This patient's echocardiogram is reviewed and he clearly meets echo criteria for severe  aortic stenosis with a heavily calcified valve, dimension was index less than 0.25, peak velocity greater than 4 m/s, and mean gradient greater than 40. He has associated symptoms of exertional dyspnea. Will refer to cardiac surgery for further evaluation of treatment options including TAVR versus conventional surgical aortic valve replacement.  Indications   Severe aortic stenosis [I35.0 (ICD-10-CM)]  Procedural Details/Technique   Technical Details INDICATION: Severe symptomatic aortic stenosis. The 71 year old gentleman with coronary artery disease who presented in March 2018 with an anterolateral STEMI. He was traveling in [place] at the time and underwent emergency cardiac catheterization and PCI. He was found to have an occluded diagonal branch which was felt to be his culprit lesion. He also was noted to have severe stenosis in the LAD and right coronary arteries. He underwent stenting of the diagonal, mid LAD, and proximal/mid RCA. He has been noted to have progressive and now severe aortic stenosis with associated exertional dyspnea.  He is referred for right and left heart catheterization to evaluate coronary anatomy and further assess his aortic valve disease and hemodynamics with plans of pursuing aortic valve replacement.  PROCEDURAL DETAILS: There was an indwelling IV in a right antecubital vein. Using normal sterile technique, the IV was changed out for a 5 Fr brachial sheath over a 0.018 inch wire. The right wrist was then prepped, draped, and anesthetized with 1% lidocaine. Using the modified Seldinger technique a 5/6 French Slender sheath was placed in the right radial artery. Intra-arterial verapamil was administered through the radial artery sheath. IV heparin was administered after a JR4 catheter was advanced into the central aorta. A Swan-Ganz catheter was used for the right heart catheterization. Standard protocol was followed for recording of right heart pressures and sampling of oxygen saturations. Fick cardiac output was calculated. Standard Judkins catheters were used for selective coronary angiography. A JR4 catheter was used to direct a J-tipped wire across the aortic valve. Pullback gradients were recorded. There were no immediate procedural complications. The patient was transferred to the post catheterization recovery area for further monitoring.         Estimated blood loss <50 mL.  During this procedure the patient was administered the following to achieve and maintain moderate conscious sedation: Versed 2 mg, Fentanyl 25 mcg, while the patient's heart rate, blood pressure, and oxygen saturation were continuously monitored. The period of conscious sedation was 28 minutes, of which I was present face-to-face 100% of this time.  Coronary Findings   Diagnostic  Dominance: Right  Left Anterior Descending  There is mild the vessel.  Mid LAD lesion 0% stenosed  Previously placed Mid LAD drug eluting stent is widely patent.  First Diagonal Branch  1st Diag lesion 0% stenosed  Previously placed 1st Diag  drug eluting stent is widely patent.  Ramus Intermedius  The intermediate branch has 50% stenosis. There is no high-grade disease in this vessel.  Ost Ramus to Ramus lesion 50% stenosed  Ost Ramus to Ramus lesion.  Left Circumflex  Vessel is angiographically normal. Left circumflex supplies 2 obtuse marginal branches with no significant stenosis.  First Obtuse Marginal Branch  Vessel is angiographically normal.  Right Coronary Artery  There is mild the vessel.  Prox RCA to Mid RCA lesion 0% stenosed  Previously placed Prox RCA to Mid RCA drug eluting stent is widely patent. There is a proximal step up into the stented segment. There is no significant stenosis present.  Mid RCA lesion 20% stenosed  The lesion was previously treated  using a drug eluting stent between 6-12 months ago. The stent is patent with minimal in-stent restenosis.  Intervention   No interventions have been documented.  Left Heart   Aortic Valve There is severe aortic valve stenosis. The aortic valve is calcified. There is restricted aortic valve motion. The aortic valve is heavily calcified. The peak to peak gradient is 42 mmHg. The mean gradient is 33 mmHg. Calculated aortic valve area is 1.38 cm.  Coronary Diagrams   Diagnostic Diagram       Implants     No implant documentation for this case.  MERGE Images   Show images for CARDIAC CATHETERIZATION   Link to Procedure Log   Procedure Log    Hemo Data    Most Recent Value  Fick Cardiac Output 7.82 L/min  Fick Cardiac Output Index 3.93 (L/min)/BSA  Aortic Mean Gradient 33.1 mmHg  Aortic Peak Gradient 42 mmHg  Aortic Valve Area 1.38  Aortic Value Area Index 0.69 cm2/BSA  RA A Wave 6 mmHg  RA V Wave 4 mmHg  RA Mean 3 mmHg  RV Systolic Pressure 32 mmHg  RV Diastolic Pressure 2 mmHg  RV EDP 6 mmHg  PA Systolic Pressure 26 mmHg  PA Diastolic Pressure 9 mmHg  PA Mean 16 mmHg  PW A Wave 10 mmHg  PW V Wave 7 mmHg  PW Mean 6 mmHg  AO Systolic  Pressure 657 mmHg  AO Diastolic Pressure 65 mmHg  AO Mean 94 mmHg  LV Systolic Pressure 846 mmHg  LV Diastolic Pressure 5 mmHg  LV EDP 14 mmHg  Arterial Occlusion Pressure Extended Systolic Pressure 962 mmHg  Arterial Occlusion Pressure Extended Diastolic Pressure 64 mmHg  Arterial Occlusion Pressure Extended Mean Pressure 94 mmHg  Left Ventricular Apex Extended Systolic Pressure 952 mmHg  Left Ventricular Apex Extended Diastolic Pressure 5 mmHg  Left Ventricular Apex Extended EDP Pressure 13 mmHg  QP/QS 1  TPVR Index 4.08 HRUI  TSVR Index 23.93 HRUI  PVR SVR Ratio 0.11  TPVR/TSVR Ratio 0.17  Order-Level Documents:   There are no order-level documents.       Cardiac TAVR CT  TECHNIQUE: The patient was scanned on a Siemens 841 slice scanner. A 100 kV retrospective scan was triggered in the ascending thoracic aorta at 140 HU's. Gantry rotation speed was 250 msecs and collimation was .9 mm. No beta blockade or nitro were given. The 3D data set was reconstructed in 5% intervals of the R-R cycle. Systolic and diastolic phases were analyzed on a dedicated work station using MPR, MIP and VRT modes. The patient received 80 cc of contrast.  FINDINGS: Aortic Valve:  Tri leaflet, calcified with restricted leaflet motion  Aorta:  Moderate calcific atherosclerotic debris  Sinotubular Junction:  28 mm  Ascending Thoracic Aorta:  32 mm  Aortic Arch:  26 mm  Descending Thoracic Aorta:  25 mm  Sinus of Valsalva Measurements:  Non-coronary:  32 mm  Right -coronary:  32 mm  Left -coronary:  32 mm  Coronary Artery Height above Annulus:  Left Main:  14.8 mm above annulus  Right Coronary:  16.3 mm above annulus  Virtual Basal Annulus Measurements:  Maximum/Minimum Diameter:  26.7 mm x 22.4 mm  Perimeter:  76.7 mm  Area:  453.7 mm2  Coronary Arteries:  Sufficient height above annulus for deployment  Optimum Fluoroscopic Angle for Delivery: LAO 3  degrees Caudal 7 degrees  IMPRESSION: 1) Calcified tri leaflet AV with annular area of 453 mm2 suitable for  a 26 mm Sapien 3 valve and perimeter of 76.7 mm suitable for a 29 mm Evolut Pro valve  2) Optimum angiographic angle for deployment LAO 3 degrees Caudal 7 degrees  3) Coronary arteries sufficient height above annulus for deployment  4) Normal aortic root 3.2 cm with moderate calcific atherosclerosis  Jenkins Rouge   Electronically Signed   By: Jenkins Rouge M.D.   On: 07/21/2017 08:07       CT ANGIOGRAPHY CHEST, ABDOMEN AND PELVIS  TECHNIQUE: Multidetector CT imaging through the chest, abdomen and pelvis was performed using the standard protocol during bolus administration of intravenous contrast. Multiplanar reconstructed images and MIPs were obtained and reviewed to evaluate the vascular anatomy.  CONTRAST:  100 cc Isovue 370 IV.  COMPARISON:  06/26/2017 chest radiograph.  FINDINGS: CTA CHEST FINDINGS  Cardiovascular: Top-normal heart size. Thickened coarsely calcified aortic valve. No significant pericardial fluid/thickening. Left anterior descending and right coronary atherosclerosis. Atherosclerotic nonaneurysmal thoracic aorta. No evidence of acute intramural hematoma, dissection, pseudoaneurysm or penetrating atherosclerotic ulcer in the thoracic aorta. Atherosclerotic aortic arch vessels with suggestion of a high-grade left subclavian artery stenosis just proximal to the left vertebral artery takeoff (series 16/ image 45) and a moderate stenosis in the proximal left common carotid artery. Normal caliber pulmonary arteries. No central pulmonary emboli.  Mediastinum/Nodes: No discrete thyroid nodules. Unremarkable esophagus. No pathologically enlarged axillary, mediastinal or hilar lymph nodes.  Lungs/Pleura: No pneumothorax. No pleural effusion. Mild centrilobular and paraseptal emphysema with diffuse bronchial wall thickening.  There is a 1.1 cm solid peripheral right lower lobe pulmonary nodule (series 15/ image 57), which may contain faint internal calcifications and appears radiographically stable back to 2009, compatible with a benign granuloma. There are clustered solid anterior right middle lobe pulmonary nodules, largest 6 mm (series 15/ image 65). No acute consolidative airspace disease, lung masses or additional significant pulmonary nodules.  Musculoskeletal: No aggressive appearing focal osseous lesions. Marked thoracic spondylosis.  CTA ABDOMEN AND PELVIS FINDINGS  Hepatobiliary: Normal liver size. No liver masses. Normal gallbladder with no radiopaque cholelithiasis. No biliary ductal dilatation.  Pancreas: There is a poorly marginated hypodense 2.8 x 1.9 cm pancreatic neck mass (series 14/ image 113). There is associated abrupt dilatation of the main pancreatic duct up to 5 mm diameter with associated mild parenchymal atrophy in the pancreatic body and tail.  Spleen: Normal size. No mass.  Adrenals/Urinary Tract: Normal adrenals. There is a heterogeneously avidly enhancing 7.5 x 6.9 x 7.6 cm renal cortical mass in the posterior upper left kidney (series 14/ image 112), which appears contained by Zuckerkandl's fascia posteriorly, with probable early tumor extension into the left renal vein at the left renal hilum (series 14/ image 116). Additional subcentimeter hypodense renal cortical lesions in both kidneys are too small to characterize. No hydronephrosis. Normal bladder.  Stomach/Bowel: Grossly normal stomach. Normal caliber small bowel with no small bowel wall thickening. Appendectomy. Moderate sigmoid diverticulosis, with no large bowel wall thickening or pericolonic fat stranding.  Vascular/Lymphatic: Atherosclerotic abdominal aorta with 3.1 cm infrarenal abdominal aortic aneurysm. No pathologically enlarged lymph nodes in the abdomen or pelvis.  Reproductive: Mildly  enlarged prostate with nonspecific internal prostatic calcifications.  Other: No pneumoperitoneum, ascites or focal fluid collection.  Musculoskeletal: No aggressive appearing focal osseous lesions. Moderate chronic appearing L2 vertebral compression fracture. Moderate lumbar spondylosis.  VASCULAR MEASUREMENTS PERTINENT TO TAVR:  AORTA:  Minimal Aortic Diameter -  17 x 16  mm (infrarenal abdominal aorta)  Severity of Aortic Calcification -  severe  Extensive ulcerated atherosclerotic plaque throughout the abdominal aorta. Infrarenal 3.1 cm abdominal aortic aneurysm.  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 Extensive ulcerated plaque.  Right External Iliac Artery -  Minimal Diameter - 7.8 x 5.5 mm  Tortuosity - mild-to-moderate  Calcification - moderate to severe  Ulcerated plaque. Note is made of a 2.4 cm right internal iliac artery aneurysm.  Right Common Femoral Artery -  Minimal Diameter - 8.6 x 6.3 mm  Tortuosity - mild  Calcification - moderate  Ulcerated plaque.  LEFT PELVIS:  Left Common Iliac Artery -  Minimal Diameter - 8.5 x 7.5 mm  Tortuosity - mild-to-moderate  Calcification - severe  Ulcerated plaque.  Left External Iliac Artery -  Minimal Diameter - 6.2 x 5.6 mm  Tortuosity - mild  Calcification - severe  Ulcerated plaque.  Left Common Femoral Artery -  Minimal Diameter - 7.5 x 7.0 mm  Tortuosity - mild  Calcification - moderate  Ulcerated plaque.  Review of the MIP images confirms the above findings.  IMPRESSION: 1. Vascular findings and measurements pertinent to potential TAVR procedure, as detailed above. 2. Severe thickening and calcification of the aortic valve, compatible with the reported  clinical history of severe aortic stenosis. 3. Poorly marginated hypodense 2.8 cm pancreatic neck mass with associated abrupt pancreatic duct dilation and parenchymal atrophy in the pancreatic body and tail. These findings are highly worrisome for primary pancreatic adenocarcinoma. GI consultation is recommended for consideration of endoscopic ultrasound with FNA. MRI abdomen without and with IV contrast may be obtained for further characterization. 4. 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 Probable early tumor extension into the left renal vein at the left renal hilum. Urology consultation is recommended. This mass can also be further characterized on MRI abdomen without and with IV contrast. 5. No definite evidence of metastatic disease. Clustered right middle lobe solid pulmonary nodules, largest 6 mm, for which follow-up chest CT is recommended in 3 months. 6. 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 (ICD10-I71.9). Recommend follow-up aortic ultrasound in 3 years. This recommendation follows ACR consensus guidelines: White Paper of the ACR Incidental Findings Committee II on Vascular Findings. J Am Coll Radiol 2013; 10:789-794. 7. 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 8. Suggestion of a high-grade left subclavian artery stenosis just proximal to the left vertebral artery takeoff. Correlate clinically for any evidence of subclavian steal syndrome involving the left upper extremity. 9. Additional chronic findings include: Aortic Atherosclerosis (ICD10-I70.0) and Emphysema (ICD10-J43.9). Moderate sigmoid diverticulosis. Mild prostatomegaly. Moderate chronic appearing L2 vertebral compression fracture.  These results will be called to the ordering clinician or representative by the Radiologist Assistant, and  communication documented in the PACS or zVision Dashboard.   Electronically Signed   By: Ilona Sorrel M.D.   On: 07/20/2017 17:28    STS Risk Calculator  Procedure: AV Replacement  Risk of Mortality: 2.572%  Morbidity or Mortality: 18.234%  Long Length of Stay: 7.637%  Short Length of Stay: 33.265%  Permanent Stroke: 1.282%  Prolonged Ventilation: 11.435%  DSW Infection: 0.378%  Renal Failure: 5.098%  Reoperation: 7.217%     Impression:  Patient has stage D severe symptomatic  aortic stenosis.  He describes stable symptoms of exertional shortness of breath and chest tightness consistent with chronic diastolic congestive heart failure New [place] Heart Association functional class IIb.  He originally presented last March with an acute anterior wall ST segment elevation myocardial infarction while he was on vacation in [place] and was treated with multivessel PCI and stenting using drug eluting stents.  I have personally reviewed the patient's recent transthoracic echocardiogram, follow-up diagnostic catheterization and CT angiograms.  Echocardiogram demonstrates the presence of normal left ventricular systolic function with severe aortic stenosis.  The aortic valve is trileaflet with severe thickening, calcification, and restricted leaflet mobility involving all 3 leaflets of the aortic valve.  Peak velocity across the aortic valve measured nearly 4.3 m/s corresponding to a mean transvalvular gradient estimated 43 mmHg.  The DVI was reported 0.22.  Catheterization revealed continued patency of all 4 previously placed stents with otherwise mild nonobstructive coronary artery disease.  I agree the patient needs aortic valve replacement.  Cardiac-gated CTA of the heart reveals anatomical characteristics consistent with aortic stenosis suitable for treatment by transcatheter aortic valve replacement without any significant complicating features and CTA of the aorta and iliac vessels demonstrate  what appears to be adequate pelvic vascular access to facilitate a transfemoral approach.  Unfortunately, CT angiography also demonstrates several worrisome findings that have significant impact on the patient's short and long-term prognosis: #1 the patient has what appears to be a mass in the head of the pancreas with poststenotic dilatation of the pancreatic duct, all of which is worrisome for the possibility of a primary pancreatic malignancy.  The patient was noted to have mild tenderness on palpation of the left upper quadrant during his physical exam.  #2 the patient has what appears to be a large mass in the left kidney potentially consistent with renal cell carcinoma.  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  I would personally be reluctant to treat the patient's severe aortic stenosis using conventional surgery due to concerns that it might dramatically delay appropriate therapy for the patient's pancreatic mass and left renal mass.  However, the patient's aortic stenosis is quite severe and I do not feel that he would likely tolerate definitive surgical management of a pancreatic mass or left renal mass if it were felt to be indicated.  I favor transcatheter aortic valve replacement as an alternative to conventional surgery associated with at least moderate risk.    Plan:  The patient and his wife were counseled at length regarding treatment alternatives for management of severe symptomatic aortic stenosis. Alternative approaches such as conventional aortic valve replacement, transcatheter aortic valve replacement, and continued medical therapy were compared and contrasted at length.  The risks associated with conventional surgical aortic valve replacement were been discussed in detail, as were expectations for post-operative convalescence.  Issues specific to  transcatheter aortic valve replacement were discussed including questions about long term valve durability, the potential for paravalvular leak, possible increased risk of need for permanent pacemaker placement, and other technical complications related to the procedure itself.  The other concerning findings identified on the patient's recent CT angiogram were discussed including the pancreatic mass in the left renal mass.  All of their questions have been addressed.    As a next step the patient will undergo MRI of the abdomen with and without IV contrast to further evaluate both the  pancreatic mass in the left renal mass.  The patient will additionally undergo MRI of the brain to rule out metastatic disease.  He will then be referred to GI medicine for further workup, potentially to include ERCP with biopsy of the pancreatic mass.  Eventually he will need to be referred to urology for consultation, but under the circumstances this probably should wait until GI workup has been completed.  Depending upon recommendations we can make plans to proceed with further workup and possible transcatheter aortic valve replacement in the near future.  The patient's case will be reviewed by a multidisciplinary team of specialists on a regular basis as details unfold.  I spent in excess of 90 minutes during the conduct of this office consultation and >50% of this time involved direct face-to-face encounter with the patient for counseling and/or coordination of their care.    Valentina Gu. Roxy Manns, MD 07/24/2017 4:40 PM

## 2017-07-24 NOTE — Patient Instructions (Addendum)
Continue all previous medications without any changes at this time  You are scheduled for an MRI of Abdomen with and without contrast. This will be done on Thursday, July 27, 2017 at 3:00 PM.  Please arrive at 2:30 PM for check in.  MRI department near [place], [address] (same building as [facility], [place]). Nothing to eat or drink 4 hours prior to MRI  You are scheduled for an MRI of Brain on Monday, July 31, 2017.  Arrive at 6:45 PM for check in. No restrictions. Same location at [facility].     You have been referred to Dr Owens Loffler for GI evaluation. The office will contact you in regards to an appointment.   You have been referred to [facility] for evaluation of Left Renal Mass. The office will contact you in regards to an appointment.

## 2017-07-24 NOTE — Telephone Encounter (Signed)
Appointment scheduled for 07/25/17 @ 3pm with Dr. [name]

## 2017-07-25 ENCOUNTER — Ambulatory Visit (INDEPENDENT_AMBULATORY_CARE_PROVIDER_SITE_OTHER): Payer: Medicare Other | Admitting: Gastroenterology

## 2017-07-25 ENCOUNTER — Encounter: Payer: Self-pay | Admitting: Gastroenterology

## 2017-07-25 ENCOUNTER — Ambulatory Visit: Payer: Medicare Other | Admitting: Gastroenterology

## 2017-07-25 VITALS — BP 126/74 | HR 68 | Ht 70.0 in | Wt 174.2 lb

## 2017-07-25 DIAGNOSIS — K8689 Other specified diseases of pancreas: Secondary | ICD-10-CM

## 2017-07-25 DIAGNOSIS — K869 Disease of pancreas, unspecified: Secondary | ICD-10-CM | POA: Diagnosis not present

## 2017-07-25 NOTE — H&P (View-Only) (Signed)
HPI: This is a very pleasant Manuel Olson who was referred by Dr. ON to discuss abnormal pancreas  Chief complaint is abnormal pancreas on recent CT scan  His wife is with him today.  AMI while in [place] this past spring.  Ended up getting angio/stenting.  More recently he was found to have aortic stenosis, severe.  As part of the workup for that he underwent CT angiogram and it showed a 2.8 cm mass in the neck of his pancreas.  He has never had pancreatic problems that he knows of.  There is no family history of pancreatic disease.  His weight has been relatively stable if anything he has probably gained just a few pounds since his heart attack this past spring.  He was never an abuser of alcohol.  He has no significant abdominal pains.   Old Data Reviewed:  CT angio abdomen, pelvis 07/2017:  Poorly marginated hypodense 2.8 cm pancreatic neck mass with associated abrupt pancreatic duct dilation and parenchymal atrophy in the pancreatic body and tail. These findings are highly worrisome for primary pancreatic adenocarcinoma. GI consultation is recommended for consideration of endoscopic ultrasound with FNA. MRI abdomen without and with IV contrast may be obtained for further characterization.   Review of systems: Pertinent positive and negative review of systems were noted in the above HPI section. All other review negative.   Past Medical History:  Diagnosis Date  . Aortic stenosis   . Arthritis   . CAD (coronary artery disease)    DES to mid LAD, DES to diagonal, DES x 2 to proximal and mid RCA - March 2018, [place]  . Essential hypertension   . Hyperlipidemia   . Type 2 diabetes mellitus (HCC)     Past Surgical History:  Procedure Laterality Date  . APPENDECTOMY    . EYE SURGERY Left   . HAND SURGERY Bilateral   . HERNIA REPAIR Right   . SHOULDER ARTHROSCOPY WITH ROTATOR CUFF REPAIR Left     Current Outpatient Medications  Medication Sig Dispense  Refill  . aspirin EC 81 MG tablet Take 1 tablet (81 mg total) by mouth daily. 90 tablet 3  . atorvastatin (LIPITOR) 20 MG tablet Take 20 mg by mouth daily.    . carvedilol (COREG) 3.125 MG tablet Take 1 tablet (3.125 mg total) by mouth 2 (two) times daily with a meal. 60 tablet 6  . clopidogrel (PLAVIX) 75 MG tablet Take 1 tablet (75 mg total) by mouth daily. 90 tablet 0  . glipiZIDE (GLUCOTROL) 5 MG tablet Take 5 mg by mouth daily before breakfast.     . lisinopril (PRINIVIL,ZESTRIL) 10 MG tablet Take 1 tablet (10 mg total) by mouth daily. 30 tablet 6   No current facility-administered medications for this visit.     Allergies as of 07/25/2017  . (No Known Allergies)    Family History  Problem Relation Age of Onset  . Lupus Mother   . CAD Brother   . Hypertension Brother     Social History   Socioeconomic History  . Marital status: Married    Spouse name: Not on file  . Number of children: Not on file  . Years of education: Not on file  . Highest education level: Not on file  Social Needs  . Financial resource strain: Not on file  . Food insecurity - worry: Not on file  . Food insecurity - inability: Not on file  . Transportation needs - medical: Not on file  .   Transportation needs - non-medical: Not on file  Occupational History  . Not on file  Tobacco Use  . Smoking status: Former Smoker    Packs/day: 1.00    Years: 57.00    Pack years: 57.00    Types: Cigarettes    Start date: 09/12/1958    Last attempt to quit: 04/12/2016    Years since quitting: 1.2  . Smokeless tobacco: Never Used  Substance and Sexual Activity  . Alcohol use: No  . Drug use: No  . Sexual activity: Not on file  Other Topics Concern  . Not on file  Social History Narrative  . Not on file     Physical Exam: BP 126/74   Pulse 68   Ht 5' 10" (1.778 m)   Wt 174 lb 4 oz (79 kg)   BMI 25.00 kg/m  Constitutional: generally well-appearing Psychiatric: alert and oriented x3 Eyes:  extraocular movements intact Mouth: oral pharynx moist, no lesions Neck: supple no lymphadenopathy Cardiovascular: heart regular rate and rhythm Lungs: clear to auscultation bilaterally Abdomen: soft, nontender, nondistended, no obvious ascites, no peritoneal signs, normal bowel sounds Extremities: no lower extremity edema bilaterally Skin: no lesions on visible extremities   Assessment and plan: 71 y.o. male with coronary artery disease, severe aortic stenosis, recently found to have a mass in his pancreas  I reviewed the CT angiogram images.  He has a soft tissue mass in the neck of his pancreas with upstream pancreatic duct dilation.  This is concerning for neoplasm.  I recommended and described endoscopic ultrasound with fine-needle aspiration to sample pancreatic mass.  He is on Plavix for drug-eluting stent and I greatly prefer that that be held for 5 days prior to pancreatic biopsy.  We will communicate with his cardiologist about the safety of holding that medicine for 5 days.  I have communicated with his CT surgeon about the need for MAC sedation during the endoscopic procedure and he believes it will be okay to proceed with that from cardiac standpoint, aortic stenosis.  We are looking at doing this for him next Wednesday.    Please see the "Patient Instructions" section for addition details about the plan.   Daya Dutt, MD [facility] 07/25/2017, 3:00 PM  Cc: Dr. Owen. 

## 2017-07-25 NOTE — Progress Notes (Signed)
HPI: This is a very pleasant 71 year old man who was referred by Dr. ON to discuss abnormal pancreas  Chief complaint is abnormal pancreas on recent CT scan  His wife is with him today.  AMI while in [place] this past spring.  Ended up getting angio/stenting.  More recently he was found to have aortic stenosis, severe.  As part of the workup for that he underwent CT angiogram and it showed a 2.8 cm mass in the neck of his pancreas.  He has never had pancreatic problems that he knows of.  There is no family history of pancreatic disease.  His weight has been relatively stable if anything he has probably gained just a few pounds since his heart attack this past spring.  He was never an abuser of alcohol.  He has no significant abdominal pains.   Old Data Reviewed:  CT angio abdomen, pelvis 07/2017:  Poorly marginated hypodense 2.8 cm pancreatic neck mass with associated abrupt pancreatic duct dilation and parenchymal atrophy in the pancreatic body and tail. These findings are highly worrisome for primary pancreatic adenocarcinoma. GI consultation is recommended for consideration of endoscopic ultrasound with FNA. MRI abdomen without and with IV contrast may be obtained for further characterization.   Review of systems: Pertinent positive and negative review of systems were noted in the above HPI section. All other review negative.   Past Medical History:  Diagnosis Date  . Aortic stenosis   . Arthritis   . CAD (coronary artery disease)    DES to mid LAD, DES to diagonal, DES x 2 to proximal and mid RCA - March 2018, [facility]  . Essential hypertension   . Hyperlipidemia   . Type 2 diabetes mellitus ([place])     Past Surgical History:  Procedure Laterality Date  . APPENDECTOMY    . EYE SURGERY Left   . HAND SURGERY Bilateral   . HERNIA REPAIR Right   . SHOULDER ARTHROSCOPY WITH ROTATOR CUFF REPAIR Left     Current Outpatient Medications  Medication Sig Dispense  Refill  . aspirin EC 81 MG tablet Take 1 tablet (81 mg total) by mouth daily. 90 tablet 3  . atorvastatin (LIPITOR) 20 MG tablet Take 20 mg by mouth daily.    . carvedilol (COREG) 3.125 MG tablet Take 1 tablet (3.125 mg total) by mouth 2 (two) times daily with a meal. 60 tablet 6  . clopidogrel (PLAVIX) 75 MG tablet Take 1 tablet (75 mg total) by mouth daily. 90 tablet 0  . glipiZIDE (GLUCOTROL) 5 MG tablet Take 5 mg by mouth daily before breakfast.     . lisinopril (PRINIVIL,ZESTRIL) 10 MG tablet Take 1 tablet (10 mg total) by mouth daily. 30 tablet 6   No current facility-administered medications for this visit.     Allergies as of 07/25/2017  . (No Known Allergies)    Family History  Problem Relation Age of Onset  . Lupus Mother   . CAD Brother   . Hypertension Brother     Social History   Socioeconomic History  . Marital status: Married    Spouse name: Not on file  . Number of children: Not on file  . Years of education: Not on file  . Highest education level: Not on file  Social Needs  . Financial resource strain: Not on file  . Food insecurity - worry: Not on file  . Food insecurity - inability: Not on file  . Transportation needs - medical: Not on file  .  Transportation needs - non-medical: Not on file  Occupational History  . Not on file  Tobacco Use  . Smoking status: Former Smoker    Packs/day: 1.00    Years: 57.00    Pack years: 57.00    Types: Cigarettes    Start date: 09/12/1958    Last attempt to quit: 04/12/2016    Years since quitting: 1.2  . Smokeless tobacco: Never Used  Substance and Sexual Activity  . Alcohol use: No  . Drug use: No  . Sexual activity: Not on file  Other Topics Concern  . Not on file  Social History Narrative  . Not on file     Physical Exam: BP 126/74   Pulse 68   Ht _0  (1.778 m)   Wt 174 lb 4 oz (79 kg)   BMI 25.00 kg/m  Constitutional: generally well-appearing Psychiatric: alert and oriented x3 Eyes:  extraocular movements intact Mouth: oral pharynx moist, no lesions Neck: supple no lymphadenopathy Cardiovascular: heart regular rate and rhythm Lungs: clear to auscultation bilaterally Abdomen: soft, nontender, nondistended, no obvious ascites, no peritoneal signs, normal bowel sounds Extremities: no lower extremity edema bilaterally Skin: no lesions on visible extremities   Assessment and plan: 71 y.o. male with coronary artery disease, severe aortic stenosis, recently found to have a mass in his pancreas  I reviewed the CT angiogram images.  He has a soft tissue mass in the neck of his pancreas with upstream pancreatic duct dilation.  This is concerning for neoplasm.  I recommended and described endoscopic ultrasound with fine-needle aspiration to sample pancreatic mass.  He is on Plavix for drug-eluting stent and I greatly prefer that that be held for 5 days prior to pancreatic biopsy.  We will communicate with his cardiologist about the safety of holding that medicine for 5 days.  I have communicated with his CT surgeon about the need for MAC sedation during the endoscopic procedure and he believes it will be okay to proceed with that from cardiac standpoint, aortic stenosis.  We are looking at doing this for him next Wednesday.    Please see the "Patient Instructions" section for addition details about the plan.   Owens Loffler, MD [facility] 07/25/2017, 3:00 PM  Cc: Dr. Roxy Manns.

## 2017-07-25 NOTE — Patient Instructions (Addendum)
Upper EUS with FNA next week for pancreatic mass. Wednesday 21st.  You will need to hold your plavix 5 days prior to the procedure, will communicate with Dr. Domenic Polite.  Normal BMI (Body Mass Index- based on height and weight) is between 23 and 30. Your BMI today is Body mass index is 25 kg/m. [name] Please consider follow up  regarding your BMI with your Primary Care Provider.

## 2017-07-25 NOTE — Progress Notes (Signed)
He is 8-9 months out from DES intervention. Given need to clarify diagnosis of pancreatic mass as soon as possible, it is reasonable to hold Plavix 5-7 days prior to endoscopy with biopsy.

## 2017-07-25 NOTE — Progress Notes (Signed)
Cardiology Office Note  Date: 07/26/2017   ID: Manuel Olson, DOB 18-Jul-1946, MRN [id number]  PCP: [name], MD  Primary Cardiologist: Rozann Lesches, MD   Chief Complaint  Patient presents with  . Aortic Stenosis  . Coronary Artery Disease    History of Present Illness: Manuel Olson is a 71 y.o. male last seen in October.  He was referred for a cardiac catheterization at that time and initiation of workup for possible AVR due to severe aortic stenosis.  Procedure was performed by Dr. Burt Knack and revealed multivessel CAD with patency of stent sites within the LAD, first diagonal, and mid RCA.  Severe aortic stenosis was also confirmed based on gradients.  He just recently saw Dr. Roxy Manns on November 12 - I reviewed the note. TAVR is being considered, although patient requires additional workup of unanticipated abnormalities documented by CT imaging including a pancreatic head mass, left renal mass, and multiple small pulmonary nodules.  Additional imaging studies are planned including MRI with further consultation by urology and gastroenterology.  Dr. [name] yesterday and will be undergoing endoscopy with biopsy next week.  He presents today with his wife.  Does not report any major change in symptoms or stamina in the last few weeks.  Although obviously concerned about his ultimate diagnoses, he states that he is taking things as they come and seems to be in reasonable spirits.  I reviewed his medications.  He will plan to continue with present cardiac regimen, except Plavix will be put on hold temporarily for his endoscopy with biopsy next week.  DES intervention was in March of this year, ideally we would continue him on 1 year of dual antiplatelet therapy, but in this case he needs to pursue further diagnostic testing to better understand his treatment course and prognosis.  Past Medical History:  Diagnosis Date  . Aortic stenosis   . Arthritis   . CAD (coronary  artery disease)    DES to mid LAD, DES to diagonal, DES x 2 to proximal and mid RCA - March 2018, [facility] [place]  . Complication of anesthesia    slow to wake up with surgery - 20 years ago   . Dyspnea    with exertion   . Essential hypertension   . GERD (gastroesophageal reflux disease)   . Heart murmur   . Hyperlipidemia   . Myocardial infarction ([place])   . Type 2 diabetes mellitus ([place])     Past Surgical History:  Procedure Laterality Date  . APPENDECTOMY    . EYE SURGERY Left   . HAND SURGERY Bilateral   . HERNIA REPAIR Right   . SHOULDER ARTHROSCOPY WITH ROTATOR CUFF REPAIR Left     Current Outpatient Medications  Medication Sig Dispense Refill  . aspirin EC 81 MG tablet Take 1 tablet (81 mg total) by mouth daily. 90 tablet 3  . atorvastatin (LIPITOR) 20 MG tablet Take 20 mg by mouth daily.    . carvedilol (COREG) 3.125 MG tablet Take 1 tablet (3.125 mg total) by mouth 2 (two) times daily with a meal. 60 tablet 6  . clopidogrel (PLAVIX) 75 MG tablet Take 1 tablet (75 mg total) by mouth daily. 90 tablet 0  . glipiZIDE (GLUCOTROL) 5 MG tablet Take 5 mg by mouth daily before breakfast.     . lisinopril (PRINIVIL,ZESTRIL) 10 MG tablet Take 1 tablet (10 mg total) by mouth daily. 30 tablet 6   No current facility-administered medications for this visit.  Allergies:  Patient has no known allergies.   Social History: The patient  reports that he quit smoking about 15 months ago. His smoking use included cigarettes. He started smoking about 58 years ago. He has a 57.00 pack-year smoking history. he has never used smokeless tobacco. He reports that he does not drink alcohol or use drugs.   ROS:  Please see the history of present illness. Otherwise, complete review of systems is positive for none.  All other systems are reviewed and negative.   Physical Exam: VS:  BP 128/74 (BP Location: Left Arm)   Pulse 74   Ht 5' 10"  (1.778 m)   Wt 174 lb (78.9 kg)   SpO2 96%   BMI 24.97  kg/m , BMI Body mass index is 24.97 kg/m.  Wt Readings from Last 3 Encounters:  07/26/17 174 lb (78.9 kg)  07/25/17 174 lb 4 oz (79 kg)  07/24/17 178 lb (80.7 kg)    General: Patient appears comfortable at rest. HEENT: Conjunctiva and lids normal, oropharynx clear. Neck: Supple, no elevated JVP, referred cardiac murmur to the neck, no thyromegaly. Lungs: Clear to auscultation, nonlabored breathing at rest. Cardiac: Regular rate and rhythm, no S3, 3/6 systolic murmur with diminished second heart sound, no pericardial rub. Abdomen: Soft, bowel sounds present, no guarding or rebound. Extremities: No pitting edema, distal pulses 2+. Skin: Warm and dry. Musculoskeletal: No kyphosis. Neuropsychiatric: Alert and oriented x3, affect grossly appropriate.  ECG: I personally reviewed the tracing from 06/25/2017 which showed sinus rhythm with left atrial enlargement, decreased R wave progression.  Recent Labwork: 03/16/2017: ALT 12; AST 15 06/26/2017: BUN 19; Creatinine, Ser 1.03; Hemoglobin 13.1; Platelets 160; Potassium 4.6; Sodium 136     Component Value Date/Time   CHOL 102 03/16/2017 0840   TRIG 71 03/16/2017 0840   HDL 36 (L) 03/16/2017 0840   CHOLHDL 2.8 03/16/2017 0840   VLDL 14 03/16/2017 0840   LDLCALC 52 03/16/2017 0840    Other Studies Reviewed Today:  Cardiac catheterization 07/05/2017: 1. Multivessel coronary artery disease with continued patency of the stented segments in the LAD, first diagonal branch, and mid RCA 2. Mild diffuse nonobstructive residual CAD as detailed above 3. Normal right heart hemodynamics 4. Severe aortic stenosis with a peak to peak gradient of 42 mmHg and a mean gradient of 33 mmHg  Recommendations: Continue multidisciplinary heart team evaluation for severe symptomatic aortic stenosis. This patient's echocardiogram is reviewed and he clearly meets echo criteria for severe aortic stenosis with a heavily calcified valve, dimension was index less  than 0.25, peak velocity greater than 4 m/s, and mean gradient greater than 40. He has associated symptoms of exertional dyspnea. Will refer to cardiac surgery for further evaluation of treatment options including TAVR versus conventional surgical aortic valve replacement.  Echocardiogram 06/19/2017: Study Conclusions  - Left ventricle: The cavity size was normal. There was mild   concentric hypertrophy. Systolic function was normal. The   estimated ejection fraction was in the range of 55% to 60%.   Doppler parameters are consistent with abnormal left ventricular   relaxation (grade 1 diastolic dysfunction). - Aortic valve: There was severe stenosis. There was mild   regurgitation. Valve area (VTI): 0.61 cm^2. Valve area (Vmax):   0.62 cm^2. Valve area (Vmean): 0.59 cm^2. - Mitral valve: Calcified annulus. Mildly thickened leaflets . - Left atrium: The atrium was mildly dilated. - Atrial septum: No defect or patent foramen ovale was identified.  Assessment and Plan:  1.  Severe  calcific aortic stenosis, symptomatic.  Patient has undergone evaluation by Dr. Burt Knack and Dr. Roxy Manns with tentative plan for TAVR pending further evaluation of other comorbidities.  2.  Soft tissue mass in the neck of the pancreas with upstream pancreatic ductal dilatation concerning for neoplasm.  He is to undergo endoscopy with fine-needle aspiration next week with Dr. [name].  Plavix is being put on hold temporarily in anticipation of this procedure, at least 5 days prior.  3.  Left renal mass, urology consultation pending.  4.  Multivessel CAD status post anterior STEMI in March.  Recent cardiac catheterization with Dr. Burt Knack demonstrated patent stent sites within the LAD, first diagonal, and mid RCA.  Current medicines were reviewed with the patient today.  Disposition: Follow-up after above workup complete.  Signed, Satira Sark, MD, [facility] 07/26/2017 2:06 PM    [place]  at [facility] [address] [place], [place] [address] Phone: [phone]; Fax: [phone]

## 2017-07-26 ENCOUNTER — Ambulatory Visit (INDEPENDENT_AMBULATORY_CARE_PROVIDER_SITE_OTHER): Payer: Medicare Other | Admitting: Cardiology

## 2017-07-26 ENCOUNTER — Encounter (HOSPITAL_COMMUNITY): Payer: Self-pay | Admitting: *Deleted

## 2017-07-26 ENCOUNTER — Encounter: Payer: Self-pay | Admitting: Cardiology

## 2017-07-26 ENCOUNTER — Other Ambulatory Visit: Payer: Self-pay

## 2017-07-26 ENCOUNTER — Telehealth: Payer: Self-pay

## 2017-07-26 VITALS — BP 128/74 | HR 74 | Ht 70.0 in | Wt 174.0 lb

## 2017-07-26 DIAGNOSIS — I25119 Atherosclerotic heart disease of native coronary artery with unspecified angina pectoris: Secondary | ICD-10-CM | POA: Diagnosis not present

## 2017-07-26 DIAGNOSIS — I2510 Atherosclerotic heart disease of native coronary artery without angina pectoris: Secondary | ICD-10-CM | POA: Diagnosis not present

## 2017-07-26 DIAGNOSIS — N2889 Other specified disorders of kidney and ureter: Secondary | ICD-10-CM | POA: Diagnosis not present

## 2017-07-26 DIAGNOSIS — K869 Disease of pancreas, unspecified: Secondary | ICD-10-CM | POA: Diagnosis not present

## 2017-07-26 DIAGNOSIS — K8689 Other specified diseases of pancreas: Secondary | ICD-10-CM

## 2017-07-26 DIAGNOSIS — I350 Nonrheumatic aortic (valve) stenosis: Secondary | ICD-10-CM | POA: Diagnosis not present

## 2017-07-26 NOTE — Patient Instructions (Signed)
Medication Instructions:  Your physician recommends that you continue on your current medications as directed. Please refer to the Current Medication list given to you today.   Labwork: none  Testing/Procedures: None  Follow-Up: Your physician recommends that you schedule a follow-up appointment in: to be determined     Any Other Special Instructions Will Be Listed Below (If Applicable).     If you need a refill on your cardiac medications before your next appointment, please call your pharmacy.

## 2017-07-26 NOTE — Telephone Encounter (Signed)
-----   Message from Satira Sark, MD sent at 07/25/2017  6:25 PM EST -----   ----- Message ----- From: Jeoffrey Massed, RN Sent: 07/25/2017   3:11 PM To: Satira Sark, MD

## 2017-07-26 NOTE — Telephone Encounter (Signed)
Author: Satira Sark, MD Service: Cardiology Author Type: Physician  Filed: 07/25/2017 6:25 PM Encounter Date: 07/25/2017 Status: Signed  Editor: Satira Sark, MD (Physician)       [] Hide copied text  [] Hover for details   He is 8-9 months out from DES intervention. Given need to clarify diagnosis of pancreatic mass as soon as possible, it is reasonable to hold Plavix 5-7 days prior to endoscopy with biopsy.       The pt has been advised of the ok to hold plavix 5 days prior to procedure

## 2017-07-27 ENCOUNTER — Ambulatory Visit (HOSPITAL_COMMUNITY)
Admission: RE | Admit: 2017-07-27 | Discharge: 2017-07-27 | Disposition: A | Discharge status: Home / Self Care | Payer: Medicare Other | Source: Ambulatory Visit | Attending: Thoracic Surgery (Cardiothoracic Vascular Surgery) | Admitting: Thoracic Surgery (Cardiothoracic Vascular Surgery)

## 2017-07-27 ENCOUNTER — Other Ambulatory Visit: Payer: Self-pay | Admitting: Thoracic Surgery (Cardiothoracic Vascular Surgery)

## 2017-07-27 DIAGNOSIS — N2889 Other specified disorders of kidney and ureter: Secondary | ICD-10-CM | POA: Insufficient documentation

## 2017-07-27 DIAGNOSIS — K8689 Other specified diseases of pancreas: Secondary | ICD-10-CM | POA: Insufficient documentation

## 2017-07-27 DIAGNOSIS — K869 Disease of pancreas, unspecified: Secondary | ICD-10-CM | POA: Diagnosis not present

## 2017-07-27 DIAGNOSIS — K862 Cyst of pancreas: Secondary | ICD-10-CM

## 2017-07-27 MED ORDER — GADOBENATE DIMEGLUMINE 529 MG/ML IV SOLN
20.0000 mL | Freq: Once | INTRAVENOUS | Status: AC | PRN
  Administered 2017-07-27: 16 mL via INTRAVENOUS

## 2017-07-29 ENCOUNTER — Ambulatory Visit (HOSPITAL_COMMUNITY)
Admission: RE | Admit: 2017-07-29 | Discharge: 2017-07-29 | Disposition: A | Discharge status: Home / Self Care | Payer: Medicare Other | Source: Ambulatory Visit | Attending: Thoracic Surgery (Cardiothoracic Vascular Surgery) | Admitting: Thoracic Surgery (Cardiothoracic Vascular Surgery)

## 2017-07-29 DIAGNOSIS — N2889 Other specified disorders of kidney and ureter: Secondary | ICD-10-CM | POA: Diagnosis not present

## 2017-07-29 DIAGNOSIS — R9082 White matter disease, unspecified: Secondary | ICD-10-CM | POA: Insufficient documentation

## 2017-07-29 DIAGNOSIS — K862 Cyst of pancreas: Secondary | ICD-10-CM | POA: Insufficient documentation

## 2017-07-29 MED ORDER — GADOBENATE DIMEGLUMINE 529 MG/ML IV SOLN
20.0000 mL | Freq: Once | INTRAVENOUS | Status: AC | PRN
  Administered 2017-07-29: 17 mL via INTRAVENOUS

## 2017-07-31 ENCOUNTER — Ambulatory Visit (HOSPITAL_COMMUNITY): Admission: RE | Admit: 2017-07-31 | Payer: Medicare Other | Source: Ambulatory Visit

## 2017-08-02 ENCOUNTER — Ambulatory Visit (HOSPITAL_COMMUNITY): Payer: Medicare Other | Admitting: Anesthesiology

## 2017-08-02 ENCOUNTER — Encounter (HOSPITAL_COMMUNITY): Payer: Self-pay | Admitting: *Deleted

## 2017-08-02 ENCOUNTER — Encounter (HOSPITAL_COMMUNITY)
Admission: RE | Disposition: A | Discharge status: Home / Self Care | Payer: Self-pay | Source: Ambulatory Visit | Attending: Gastroenterology

## 2017-08-02 ENCOUNTER — Other Ambulatory Visit: Payer: Self-pay

## 2017-08-02 ENCOUNTER — Ambulatory Visit (HOSPITAL_COMMUNITY)
Admission: RE | Admit: 2017-08-02 | Discharge: 2017-08-02 | Disposition: A | Discharge status: Home / Self Care | Payer: Medicare Other | Source: Ambulatory Visit | Attending: Gastroenterology | Admitting: Gastroenterology

## 2017-08-02 DIAGNOSIS — I252 Old myocardial infarction: Secondary | ICD-10-CM | POA: Diagnosis not present

## 2017-08-02 DIAGNOSIS — Z7902 Long term (current) use of antithrombotics/antiplatelets: Secondary | ICD-10-CM | POA: Diagnosis not present

## 2017-08-02 DIAGNOSIS — C251 Malignant neoplasm of body of pancreas: Secondary | ICD-10-CM | POA: Diagnosis not present

## 2017-08-02 DIAGNOSIS — K8689 Other specified diseases of pancreas: Secondary | ICD-10-CM

## 2017-08-02 DIAGNOSIS — I2510 Atherosclerotic heart disease of native coronary artery without angina pectoris: Secondary | ICD-10-CM | POA: Insufficient documentation

## 2017-08-02 DIAGNOSIS — E785 Hyperlipidemia, unspecified: Secondary | ICD-10-CM | POA: Diagnosis not present

## 2017-08-02 DIAGNOSIS — C257 Malignant neoplasm of other parts of pancreas: Secondary | ICD-10-CM | POA: Diagnosis not present

## 2017-08-02 DIAGNOSIS — Z87891 Personal history of nicotine dependence: Secondary | ICD-10-CM | POA: Diagnosis not present

## 2017-08-02 DIAGNOSIS — Z7984 Long term (current) use of oral hypoglycemic drugs: Secondary | ICD-10-CM | POA: Insufficient documentation

## 2017-08-02 DIAGNOSIS — M1990 Unspecified osteoarthritis, unspecified site: Secondary | ICD-10-CM | POA: Insufficient documentation

## 2017-08-02 DIAGNOSIS — Z8249 Family history of ischemic heart disease and other diseases of the circulatory system: Secondary | ICD-10-CM | POA: Diagnosis not present

## 2017-08-02 DIAGNOSIS — Z955 Presence of coronary angioplasty implant and graft: Secondary | ICD-10-CM | POA: Diagnosis not present

## 2017-08-02 DIAGNOSIS — R011 Cardiac murmur, unspecified: Secondary | ICD-10-CM | POA: Diagnosis not present

## 2017-08-02 DIAGNOSIS — Z79899 Other long term (current) drug therapy: Secondary | ICD-10-CM | POA: Diagnosis not present

## 2017-08-02 DIAGNOSIS — Z7982 Long term (current) use of aspirin: Secondary | ICD-10-CM | POA: Insufficient documentation

## 2017-08-02 DIAGNOSIS — E119 Type 2 diabetes mellitus without complications: Secondary | ICD-10-CM | POA: Diagnosis not present

## 2017-08-02 DIAGNOSIS — I350 Nonrheumatic aortic (valve) stenosis: Secondary | ICD-10-CM | POA: Diagnosis not present

## 2017-08-02 DIAGNOSIS — K869 Disease of pancreas, unspecified: Secondary | ICD-10-CM

## 2017-08-02 DIAGNOSIS — I10 Essential (primary) hypertension: Secondary | ICD-10-CM | POA: Insufficient documentation

## 2017-08-02 HISTORY — PX: EUS: SHX5427

## 2017-08-02 HISTORY — DX: Acute myocardial infarction, unspecified: I21.9

## 2017-08-02 HISTORY — DX: Gastro-esophageal reflux disease without esophagitis: K21.9

## 2017-08-02 LAB — GLUCOSE, CAPILLARY
Glucose-Capillary: 267 mg/dL — ABNORMAL HIGH (ref 65–99)
Glucose-Capillary: 209 mg/dL — ABNORMAL HIGH (ref 65–99)

## 2017-08-02 SURGERY — UPPER ENDOSCOPIC ULTRASOUND (EUS) RADIAL
Anesthesia: Monitor Anesthesia Care

## 2017-08-02 MED ORDER — LACTATED RINGERS IV SOLN
INTRAVENOUS | Status: DC
Start: 2017-08-02 — End: 2017-08-02
  Administered 2017-08-02: 12:00:00 via INTRAVENOUS

## 2017-08-02 MED ORDER — PROPOFOL 10 MG/ML IV BOLUS
INTRAVENOUS | Status: DC | PRN
  Administered 2017-08-02 (×3): 20 mg via INTRAVENOUS

## 2017-08-02 MED ORDER — PROPOFOL 10 MG/ML IV BOLUS
INTRAVENOUS | Status: AC
  Filled 2017-08-02: qty 20

## 2017-08-02 MED ORDER — PHENYLEPHRINE 40 MCG/ML (10ML) SYRINGE FOR IV PUSH (FOR BLOOD PRESSURE SUPPORT)
PREFILLED_SYRINGE | INTRAVENOUS | Status: AC
  Filled 2017-08-02: qty 10

## 2017-08-02 MED ORDER — LIDOCAINE 2% (20 MG/ML) 5 ML SYRINGE
INTRAMUSCULAR | Status: DC | PRN
  Administered 2017-08-02: 100 mg via INTRAVENOUS

## 2017-08-02 MED ORDER — SODIUM CHLORIDE 0.9 % IV SOLN: INTRAVENOUS | Status: DC

## 2017-08-02 MED ORDER — PROPOFOL 500 MG/50ML IV EMUL
INTRAVENOUS | Status: DC | PRN
  Administered 2017-08-02: 125 ug/kg/min via INTRAVENOUS

## 2017-08-02 NOTE — Op Note (Signed)
[facility] Patient Name: Manuel Olson Procedure Date: 08/02/2017 MRN: [id number] Attending MD: [name] , MD Date of Birth: 02/08/1946 CSN: [id number] Age: 71 Admit Type: Outpatient Procedure:                Upper EUS Indications:              mass in pancreatic neck on recent CT/MRI, upstream                            main pancreatic duct dilation. also incidental                            renal tumor Providers:                [name], MD, Elmer Ramp. Tilden Dome, RN, Elspeth Cho Tech., Technician, Danley Danker, CRNA Referring MD:              Medicines:                Monitored Anesthesia Care Complications:            No immediate complications. Estimated blood loss:                            None. Estimated Blood Loss:     Estimated blood loss: none. Procedure:                Pre-Anesthesia Assessment:                           - Prior to the procedure, a History and Physical                            was performed, and patient medications and                            allergies were reviewed. The patient's tolerance of                            previous anesthesia was also reviewed. The risks                            and benefits of the procedure and the sedation                            options and risks were discussed with the patient.                            All questions were answered, and informed consent                            was obtained. Prior Anticoagulants: The patient has  taken Plavix (clopidogrel), last dose was 5 days                            prior to procedure. ASA Grade Assessment: IV - A                            patient with severe systemic disease that is a                            constant threat to life. After reviewing the risks                            and benefits, the patient was deemed in                            satisfactory condition to  undergo the procedure.                           After obtaining informed consent, the endoscope was                            passed under direct vision. Throughout the                            procedure, the patient's blood pressure, pulse, and                            oxygen saturations were monitored continuously. The                            [id number] ([id number]) scope was introduced through                            the mouth, and advanced to the second part of                            duodenum. The upper EUS was accomplished without                            difficulty. The patient tolerated the procedure                            well. Scope In: Scope Out: Findings:      Endoscopic Finding :      The examined esophagus was endoscopically normal.      The entire examined stomach was endoscopically normal.      The examined duodenum was endoscopically normal.      Endosonographic Finding :      1. There was an irregular mass was identified in the pancreatic neck.       The mass was hypoechoic and heterogenous. The mass measured 18 mm by 25       mm in maximal cross-sectional diameter and directly abuts the main       portal vein The endosonographic borders were poorly-defined. The  remainder of the pancreas was examined and appeared atrophic in the       body, tail of the gland. The endosonographic appearance of parenchyma       and the upstream pancreatic duct indicated duct dilation (up to 4-57m in       body, tail) Fine needle aspiration for cytology was performed. Color       Doppler imaging was utilized prior to needle puncture to confirm a lack       of significant vascular structures within the needle path. Two passes       were made with the 25 gauge needle using a transgastric approach. A       cytotechnologist was present to evaluate the adequacy of the specimen.      2. No peripancreatic adenopathy.      3. The CBD was normal, non-dilated.      4.  Limited views of the liver and spleen were normal. Impression:               - Irregular appearing 1.8cm by 2.5cm mass in the                            neck of pancreas causing upstream main pancreatic                            duct obstruction/dilation and directly abutting the                            main portal vein. Preliminary cytology review                            suggests atypical cells, await final review. Moderate Sedation:      N/A- Per Anesthesia Care Recommendation:           - Discharge patient to home (ambulatory).                           - Continue workup for renal mass, it is not clear                            if that is related to the pancreas mass.                           - OK to restart your plavix today.                           - My office will arrange referral to medical                            oncology. Procedure Code(s):        --- Professional ---                           [phone] Esophagogastroduodenoscopy, flexible,                            transoral; with transendoscopic ultrasound-guided  intramural or transmural fine needle                            aspiration/biopsy(s), (includes endoscopic                            ultrasound examination limited to the esophagus,                            stomach or duodenum, and adjacent structures) Diagnosis Code(s):        --- Professional ---                           K86.89, Other specified diseases of pancreas                           R93.3, Abnormal findings on diagnostic imaging of                            other parts of digestive tract CPT copyright 2016 American Medical Association. All rights reserved. The codes documented in this report are preliminary and upon coder review may  be revised to meet current compliance requirements. [name], MD 08/02/2017 1:57:58 PM This report has been signed electronically. Number of Addenda: 0

## 2017-08-02 NOTE — Anesthesia Postprocedure Evaluation (Signed)
Anesthesia Post Note  Patient: Manuel Olson  Procedure(s) Performed: UPPER ENDOSCOPIC ULTRASOUND (EUS) RADIAL (N/A )     Patient location during evaluation: PACU Anesthesia Type: MAC Level of consciousness: awake and alert Pain management: pain level controlled Vital Signs Assessment: post-procedure vital signs reviewed and stable Respiratory status: spontaneous breathing, nonlabored ventilation, respiratory function stable and patient connected to nasal cannula oxygen Cardiovascular status: stable and blood pressure returned to baseline Postop Assessment: no apparent nausea or vomiting Anesthetic complications: no    Last Vitals:  Vitals:   08/02/17 1125 08/02/17 1400  BP: (!) 186/84 (!) 182/77  Pulse: 72 70  Resp: 14 12  Temp: 36.6 C 36.6 C  SpO2: 96% 99%    Last Pain:  Vitals:   08/02/17 1400  TempSrc: Oral                 Josephine Rudnick S

## 2017-08-02 NOTE — Interval H&P Note (Signed)
History and Physical Interval Note:  08/02/2017 11:26 AM  Manuel Olson  has presented today for surgery, with the diagnosis of pancreatic mass  The various methods of treatment have been discussed with the patient and family. After consideration of risks, benefits and other options for treatment, the patient has consented to  Procedure(s): UPPER ENDOSCOPIC ULTRASOUND (EUS) RADIAL (N/A) as a surgical intervention .  The patient's history has been reviewed, patient examined, no change in status, stable for surgery.  I have reviewed the patient's chart and labs.  Questions were answered to the patient's satisfaction.     [name]

## 2017-08-02 NOTE — Anesthesia Procedure Notes (Signed)
Date/Time: 08/02/2017 12:42 PM Performed by: Sharlette Dense, CRNA Oxygen Delivery Method: Nasal cannula

## 2017-08-02 NOTE — Discharge Instructions (Signed)

## 2017-08-02 NOTE — Anesthesia Preprocedure Evaluation (Signed)
Anesthesia Evaluation  Patient identified by MRN, date of birth, ID band Patient awake    Reviewed: Allergy & Precautions, NPO status , Patient's Chart, lab work & pertinent test results  Airway Mallampati: II  TM Distance: >3 FB Neck ROM: Full    Dental no notable dental hx.    Pulmonary neg pulmonary ROS, former smoker,    Pulmonary exam normal breath sounds clear to auscultation       Cardiovascular hypertension, + CAD and + Past MI  + Valvular Problems/Murmurs AS  Rhythm:Regular Rate:Normal + Systolic murmurs Left ventricle:  The cavity size was normal. There was mild concentric hypertrophy. Systolic function was normal. The estimated ejection fraction was in the range of 55% to 60%. Doppler parameters are consistent with abnormal left ventricular relaxation (grade 1 diastolic dysfunction).  ------------------------------------------------------------------- Aortic valve:   Trileaflet; severely thickened, severely calcified leaflets.  Doppler:   There was severe stenosis.   There was mild regurgitation.    VTI ratio of LVOT to aortic valve: 0.21. Valve area (VTI): 0.61 cm^2. Indexed valve area (VTI): 0.31 cm^2/m^2. Peak velocity ratio of LVOT to aortic valve: 0.22. Valve area (Vmax): 0.62 cm^2. Indexed valve area (Vmax): 0.31 cm^2/m^2. Mean velocity ratio of LVOT to aortic valve: 0.21. Valve area (Vmean): 0.59 cm^2. Indexed valve area (Vmean): 0.29 cm^2/m^2.    Mean gradient (S): 43 mm Hg. Peak gradient (S): 72 mm    Neuro/Psych negative neurological ROS  negative psych ROS   GI/Hepatic negative GI ROS, Neg liver ROS,   Endo/Other  diabetes  Renal/GU negative Renal ROS  negative genitourinary   Musculoskeletal negative musculoskeletal ROS (+)   Abdominal   Peds negative pediatric ROS (+)  Hematology negative hematology ROS (+)   Anesthesia Other Findings   Reproductive/Obstetrics negative OB ROS                              Anesthesia Physical Anesthesia Plan  ASA: IV  Anesthesia Plan: MAC   Post-op Pain Management:    Induction: Intravenous  PONV Risk Score and Plan: 0  Airway Management Planned: Nasal Cannula  Additional Equipment:   Intra-op Plan:   Post-operative Plan:   Informed Consent: I have reviewed the patients History and Physical, chart, labs and discussed the procedure including the risks, benefits and alternatives for the proposed anesthesia with the patient or authorized representative who has indicated his/her understanding and acceptance.   Dental advisory given  Plan Discussed with: CRNA and Surgeon  Anesthesia Plan Comments:         Anesthesia Quick Evaluation

## 2017-08-02 NOTE — Transfer of Care (Signed)
Immediate Anesthesia Transfer of Care Note  Patient: Manuel Olson  Procedure(s) Performed: UPPER ENDOSCOPIC ULTRASOUND (EUS) RADIAL (N/A )  Patient Location: Endoscopy Unit  Anesthesia Type:MAC  Level of Consciousness: sedated  Airway & Oxygen Therapy: Patient Spontanous Breathing and Patient connected to nasal cannula oxygen  Post-op Assessment: Report given to RN and Post -op Vital signs reviewed and stable  Post vital signs: Reviewed and stable  Last Vitals:  Vitals:   08/02/17 1125  BP: (!) 186/84  Pulse: 72  Resp: 14  Temp: 36.6 C  SpO2: 96%    Last Pain:  Vitals:   08/02/17 1125  TempSrc: Oral         Complications: No apparent anesthesia complications

## 2017-08-04 ENCOUNTER — Encounter (HOSPITAL_COMMUNITY): Payer: Self-pay | Admitting: Gastroenterology

## 2017-08-07 ENCOUNTER — Other Ambulatory Visit: Payer: Self-pay

## 2017-08-07 ENCOUNTER — Encounter: Payer: Self-pay | Admitting: Oncology

## 2017-08-07 ENCOUNTER — Telehealth: Payer: Self-pay | Admitting: Oncology

## 2017-08-07 ENCOUNTER — Telehealth: Payer: Self-pay

## 2017-08-07 DIAGNOSIS — K8689 Other specified diseases of pancreas: Secondary | ICD-10-CM

## 2017-08-07 DIAGNOSIS — C259 Malignant neoplasm of pancreas, unspecified: Secondary | ICD-10-CM

## 2017-08-07 DIAGNOSIS — N2889 Other specified disorders of kidney and ureter: Secondary | ICD-10-CM

## 2017-08-07 DIAGNOSIS — D49512 Neoplasm of unspecified behavior of left kidney: Secondary | ICD-10-CM | POA: Diagnosis not present

## 2017-08-07 NOTE — Telephone Encounter (Signed)
Appt has been scheduled for the pt to see Dr. Benay Spice on 11/29 at 2pm. Appt was given to the pt's wife who's aware to arrve 30 minutes early.

## 2017-08-07 NOTE — Telephone Encounter (Signed)
I scheduled the pt for PET scan per Dr Roxy Manns.  This is booked at [facility] on 08/21/17 at [facility], arrive at 11:30.  Pt is NPO 8 hours prior to test and do not take Glipizide the morning of test.  I spoke with the pt's wife and made her aware of this information.

## 2017-08-08 DIAGNOSIS — I350 Nonrheumatic aortic (valve) stenosis: Secondary | ICD-10-CM | POA: Diagnosis not present

## 2017-08-08 DIAGNOSIS — Z7902 Long term (current) use of antithrombotics/antiplatelets: Secondary | ICD-10-CM | POA: Diagnosis not present

## 2017-08-08 DIAGNOSIS — I2510 Atherosclerotic heart disease of native coronary artery without angina pectoris: Secondary | ICD-10-CM | POA: Diagnosis not present

## 2017-08-08 DIAGNOSIS — C642 Malignant neoplasm of left kidney, except renal pelvis: Secondary | ICD-10-CM | POA: Diagnosis not present

## 2017-08-08 DIAGNOSIS — C250 Malignant neoplasm of head of pancreas: Secondary | ICD-10-CM | POA: Diagnosis not present

## 2017-08-10 ENCOUNTER — Encounter: Payer: Self-pay | Admitting: Radiation Oncology

## 2017-08-10 ENCOUNTER — Telehealth: Payer: Self-pay

## 2017-08-10 ENCOUNTER — Telehealth: Payer: Self-pay | Admitting: Oncology

## 2017-08-10 ENCOUNTER — Ambulatory Visit (HOSPITAL_BASED_OUTPATIENT_CLINIC_OR_DEPARTMENT_OTHER): Payer: Medicare Other | Admitting: Oncology

## 2017-08-10 ENCOUNTER — Encounter: Payer: Self-pay | Admitting: Oncology

## 2017-08-10 DIAGNOSIS — E119 Type 2 diabetes mellitus without complications: Secondary | ICD-10-CM

## 2017-08-10 DIAGNOSIS — I350 Nonrheumatic aortic (valve) stenosis: Secondary | ICD-10-CM

## 2017-08-10 DIAGNOSIS — I10 Essential (primary) hypertension: Secondary | ICD-10-CM

## 2017-08-10 DIAGNOSIS — C642 Malignant neoplasm of left kidney, except renal pelvis: Secondary | ICD-10-CM | POA: Diagnosis not present

## 2017-08-10 DIAGNOSIS — C250 Malignant neoplasm of head of pancreas: Secondary | ICD-10-CM | POA: Insufficient documentation

## 2017-08-10 DIAGNOSIS — I2510 Atherosclerotic heart disease of native coronary artery without angina pectoris: Secondary | ICD-10-CM | POA: Diagnosis not present

## 2017-08-10 DIAGNOSIS — C258 Malignant neoplasm of overlapping sites of pancreas: Secondary | ICD-10-CM | POA: Diagnosis not present

## 2017-08-10 NOTE — Progress Notes (Signed)
[place] Patient Consult   Referring MD: [name], [address] [place], [place] [id number]   CAROL LOFTIN 71 y.o.  14-Dec-1945    Reason for Referral: Pancreas cancer   HPI: Mr. Artola reports suffering a myocardial infarction while vacationing in [place] in March 2018.  He underwent placement of coronary stents.  He established care with cardiology in [place] and was found to have severe aortic stenosis.  He was referred for a CT angiogram as part of a preoperative valve replacement evaluation.  The CT angiogram 07/20/2017 revealed a hypodense pancreas neck mass with associated dilatation of the main pancreatic duct.  A heterogenous enhancing mass was noted in the left kidney with probable early extension into the left renal vein.  No pathologically enlarged lymph nodes in the abdomen or pelvis.  Chronic appearing L2 compression fracture. He was referred for an MRI of the abdomen 07/28/2017.  There is no focal liver abnormality, no intrahepatic or extrahepatic biliary dilatation.  A 3.3 x 3.5 cm mass was noted in the head/neck region of the pancreas.  The left kidney mass showed central necrosis.  The left renal vein appeared patent.  Mass-effect was noted on the portal splenic confluence by the pancreas mass.  The superior mesenteric vein and confluence appeared patent.  No abnormal soft tissue around the celiac axis or superior mesenteric artery.  He was referred to Dr. [name] and underwent an EUS on 08/02/2017.  A mass was noted in the pancreas neck measuring 18 x 25 mm with direct abutment of the portal vein.  An FNA biopsy was performed.  No peripancreatic adenopathy.  The common bile duct appeared normal.  The cytology ([id number]) revealed adenocarcinoma.  He was referred to Dr. Barry Dienes.  She recommends neoadjuvant therapy.  He has been seen in consultation by cardiovascular surgery, cardiology, and urology.  The plan is to proceed  with a TAVR procedure to be followed by neoadjuvant treatment for the pancreas cancer.  Mr. Authement feels well.  Past Medical History:  Diagnosis Date  . Aortic stenosis   . Arthritis   . CAD (coronary artery disease)    DES to mid LAD, DES to diagonal, DES x 2 to proximal and mid RCA - March 2018, [facility] [place]  . Complication of anesthesia    slow to wake up with surgery - 20 years ago   . Dyspnea    with exertion   . Essential hypertension   . GERD (gastroesophageal reflux disease)   . Heart murmur   . Hyperlipidemia   .  "Back fracture ", chronic back pain   . Type 2 diabetes mellitus ([place])     Past Surgical History:  Procedure Laterality Date  . APPENDECTOMY    . EUS N/A 08/02/2017   Procedure: UPPER ENDOSCOPIC ULTRASOUND (EUS) RADIAL;  Surgeon: [name], MD;  Location: [facility];  Service: Endoscopy;  Laterality: N/A;  . EYE SURGERY Left   . HAND SURGERY Bilateral   . HERNIA REPAIR Right   . RIGHT/LEFT HEART CATH AND CORONARY ANGIOGRAPHY N/A 07/05/2017   Procedure: RIGHT/LEFT HEART CATH AND CORONARY ANGIOGRAPHY;  Surgeon: [name], MD;  Location: [facility];  Service: Cardiovascular;  Laterality: N/A;  . SHOULDER ARTHROSCOPY WITH ROTATOR CUFF REPAIR Left     Medications: Reviewed  Allergies: No Known Allergies  Family history: No family history of cancer.  Social History:   He lives in [place].  He is a  retired Engineer, structural.  He quit smoking cigarettes approximately 1 year ago after smoking 1 pack a day since age 59.  No alcohol use.  No transfusion history.  No risk factor for HIV or hepatitis.  ROS:   Positives include: Chronic back pain, exertional dyspnea and fatigue since the myocardial infarction in March 2018  A complete ROS was otherwise negative.  Physical Exam:  Blood pressure (!) 161/84, pulse 72, temperature 97.8 F (36.6 C), temperature source Oral, resp. rate 20, height 5' 10"  (1.778 m), weight 175 lb 9.6 oz  (79.7 kg), SpO2 97 %.  HEENT: Upper and lower denture plate, oropharynx without visible mass, neck without mass Lungs: Distant breath sounds, no respiratory distress Cardiac: Regular rate and rhythm, 2/6 systolic murmur Abdomen: No hepatosplenomegaly, no mass, nontender GU: Testes without mass Vascular: No leg edema Lymph nodes: No cervical, supraclavicular, axillary, or inguinal nodes Neurologic: Alert and oriented, the motor exam appears intact in the upper and lower extremities Skin: No rash Musculoskeletal: Spine tenderness   LAB:  CBC  Lab Results  Component Value Date   WBC 8.0 06/26/2017   HGB 13.1 06/26/2017   HCT 40.2 06/26/2017   MCV 91.2 06/26/2017   PLT 160 06/26/2017   NEUTROABS 5.4 06/26/2017        CMP     Component Value Date/Time   NA 136 06/26/2017 0932   K 4.6 06/26/2017 0932   CL 102 06/26/2017 0932   CO2 27 06/26/2017 0932   GLUCOSE 236 (H) 06/26/2017 0932   BUN 19 06/26/2017 0932   CREATININE 1.03 06/26/2017 0932   CALCIUM 9.2 06/26/2017 0932   PROT 7.2 03/16/2017 0840   ALBUMIN 4.0 03/16/2017 0840   AST 15 03/16/2017 0840   ALT 12 (L) 03/16/2017 0840   ALKPHOS 74 03/16/2017 0840   BILITOT 0.5 03/16/2017 0840   GFRNONAA >60 06/26/2017 0932   GFRAA >60 06/26/2017 0932     No results found for: CEA1  Imaging: As per HPI, CT and MRI images reviewed at the GI tumor conference 08/09/2017   Assessment/Plan:   1. Adenocarcinoma of the pancreas head/neck, status post an EUS biopsy 08/02/2017 confirming adenocarcinoma  EUS 08/02/2017-pancreas head/neck mass, abutment of the portal vein, no peripancreatic adenopathy  MRI abdomen 07/27/2017- head/neck pancreas mass with mass-effect on the portal splenic confluence, no involvement of the celiac axis or superior mesenteric artery, no evidence of metastatic disease  2. Left renal mass consistent with a renal cell carcinoma seen on CT 07/21/2017 and MRI 07/28/2017  3.   Severe aortic  stenosis-plan for TAVR the procedure  4.   Coronary artery disease, status post myocardial infarction March 2018, status post placement of coronary stents  5.   Diabetes  6.   History of tobacco use  7.   Hypertension  Disposition:   Mr. Tolbert has been diagnosed with synchronous pancreas and renal cell carcinomas.  His case was presented at the GI tumor conference on 08/09/2017.  He has severe aortic stenosis.  The recommendation from surgery is to proceed with the TAVR prior to treatment of the pancreas cancer.  He appears to have borderline resectable pancreas cancer.  I discussed treatment options with the Mr. Pavek and his wife.  I explained the significance of "borderline "resectability.  I recommend proceeding with neoadjuvant therapy after the aortic valve surgery.  We will most likely recommend neoadjuvant FOLFIRINOX if he has a good performance status after the valve surgery.  We will also consider radiation  prior to resection of the pancreas mass.  He has been evaluated by urology and the plan is to perform a nephrectomy at the time of pancreas surgery.  He will return for an office visit in approximately 3 weeks.  We will discuss specifics of chemotherapy when he returns in 3 weeks.  He will benefit from placement of a Port-A-Cath prior to beginning chemotherapy.  50 minutes were spent with the patient today.  The majority of the time was used for counseling and coordination of care.  [name], MD  08/10/2017, 2:34 PM

## 2017-08-10 NOTE — Telephone Encounter (Signed)
I called and introduced myself to Manuel Olson and explained my role as GI Navigator. Patient stated that it has been "overwhelming" and that they are looking forward to meeting with Dr. Benay Spice to discuss tx. Patient understands that she and her husband can call me with questions or concerns throughout treatment.

## 2017-08-10 NOTE — Telephone Encounter (Signed)
Gave avs and calendar for December

## 2017-08-14 ENCOUNTER — Ambulatory Visit: Payer: Medicare Other | Admitting: Physical Therapy

## 2017-08-14 ENCOUNTER — Encounter: Payer: Medicare Other | Admitting: Surgery

## 2017-08-14 ENCOUNTER — Other Ambulatory Visit: Payer: Self-pay

## 2017-08-14 DIAGNOSIS — I350 Nonrheumatic aortic (valve) stenosis: Secondary | ICD-10-CM

## 2017-08-15 NOTE — Progress Notes (Signed)
GI Location of Tumor / Histology: Pancreas,  And renal cell carcinoma   Manuel Olson presented  months ago with symptoms of:   Biopsies of  (if applicable) revealed: Diagnosis 08/02/17: FINE NEEDLE ASPIRATION, ENDOSCOPIC, PANCREAS NECK (SPECIMEN 1 OF 1 COLLECTED 08/02/17): ADENOCARCINOMA.  Past/Anticipated interventions by surgeon, if any: EUS dr. Ardis Olson on 08/02/17 (mass 18x20m) bx taken  Past/Anticipated interventions by medical oncology, if any: Dr. SBenay Spiceappt 11./29/18  Disposition:   Manuel Olson been diagnosed with synchronous pancreas and renal cell carcinomas.  His case was presented at the GI tumor conference on 08/09/2017.  He has severe aortic stenosis.  The recommendation from surgery is to proceed with the TAVR prior to treatment of the pancreas cancer.  He appears to have borderline resectable pancreas cancer.  I discussed treatment options with the Mr. HGraefeand his wife.  I explained the significance of "borderline "resectability.  I recommend proceeding with neoadjuvant therapy after the aortic valve surgery.  We will most likely recommend neoadjuvant FOLFIRINOX if he has a good performance status after the valve surgery.  We will also consider radiation prior to resection of the pancreas mass.  He has been evaluated by urology and the plan is to perform a nephrectomy at the time of pancreas surgery.  He will return for an office visit in approximately 3 weeks.  We will discuss specifics of chemotherapy when he returns in 3 weeks.  He will benefit from placement of a Port-A-Cath prior to beginning chemotherapy.  Weight changes, if any: no  Bowel/Bladder complaints, if aIOM:BTDH Nausea / Vomiting, if any: none  Pain issues, if any:  Chronic back pain  Any blood per rectum:   no  SAFETY ISSUES:  Prior radiation? No  Pacemaker/ICD? NO  Is the patient on methotrexate? No Surgery 08/22/17 with Dr. BBerle Mulland Olson aortic valve  Current  Complaints/Details: PET scheduled  08/21/17,severe aortic stenosis, , dyspnea  With exertion, ,CAD,   Manuel/p MI 11/2016; Manuel/p stent placement ;, DM II,left and right heart cath and angiography 07/05/17   smoking 1ppd  Since age 71 quit 1 year ago ,no alcohol or drug use   Allergies: NKA

## 2017-08-16 NOTE — Pre-Procedure Instructions (Addendum)
Manuel Olson  08/16/2017      [place], [place] [address] [place] [address] Phone: [phone] Fax: [phone]  [facility] [place], [place] - [address] AT [address]. Manuel Olson [place] [address] Phone: [phone] Fax: [phone]    Your procedure is scheduled on Tuesday December 11.  Report to [facility] Admitting at 8:15 A.M.  Call this number if you have problems the morning of surgery:  [phone]   Remember:  Do not eat food or drink liquids after midnight.  Take these medicines the morning of surgery with A SIP OF WATER: NONE  DO NOT TAKE Glipizide (Glucotrol) the day of surgery.  7 days prior to surgery STOP taking any Aleve, Naproxen, Ibuprofen, Motrin, Advil, Goody's, BC's, all herbal medications, fish oil, and all vitamins  FOLLOW Surgeon's instructions on stopping Aspirin or clopidogrel (Plavix). If no instructions were given, please call surgeon's office.     How to Manage Your Diabetes Before and After Surgery  Why is it important to control my blood sugar before and after surgery? . Improving blood sugar levels before and after surgery helps healing and can limit problems. . A way of improving blood sugar control is eating a healthy diet by: o  Eating less sugar and carbohydrates o  Increasing activity/exercise o  Talking with your doctor about reaching your blood sugar goals . High blood sugars (greater than 180 mg/dL) can raise your risk of infections and slow your recovery, so you will need to focus on controlling your diabetes during the weeks before surgery. . Make sure that the doctor who takes care of your diabetes knows about your planned surgery including the date and location.  How do I manage my blood sugar before surgery? . Check your blood sugar at least 4 times a day, starting 2 days before surgery,  to make sure that the level is not too high or low. o Check your blood sugar the morning of your surgery when you wake up and every 2 hours until you get to the Short Stay unit. . If your blood sugar is less than 70 mg/dL, you will need to treat for low blood sugar: o Do not take insulin. o Treat a low blood sugar (less than 70 mg/dL) with  cup of clear juice (cranberry or apple), 4 glucose tablets, OR glucose gel. Recheck blood sugar in 15 minutes after treatment (to make sure it is greater than 70 mg/dL). If your blood sugar is not greater than 70 mg/dL on recheck, call [phone] o  for further instructions. . Report your blood sugar to the short stay nurse when you get to Short Stay.  . If you are admitted to the hospital after surgery: o Your blood sugar will be checked by the staff and you will probably be given insulin after surgery (instead of oral diabetes medicines) to make sure you have good blood sugar levels. o The goal for blood sugar control after surgery is 80-180 mg/dL.               Do not wear jewelry, make-up or nail polish.  Do not wear lotions, powders, or perfumes, or deoderant.  Do not shave 48 hours prior to surgery.  Men may shave face and neck.  Do not bring valuables to the hospital.  [facility] is not  responsible for any belongings or valuables.  Contacts, dentures or bridgework may not be worn into surgery.  Leave your suitcase in the car.  After surgery it may be brought to your room.  For patients admitted to the hospital, discharge time will be determined by your treatment team.  Patients discharged the day of surgery will not be allowed to drive home.   Special instructions:    [place]- Preparing For Surgery  Before surgery, you can play an important role. Because skin is not sterile, your skin needs to be as free of germs as possible. You can reduce the number of germs on your skin by washing with CHG (chlorahexidine gluconate) Soap  before surgery.  CHG is an antiseptic cleaner which kills germs and bonds with the skin to continue killing germs even after washing.  Please do not use if you have an allergy to CHG or antibacterial soaps. If your skin becomes reddened/irritated stop using the CHG.  Do not shave (including legs and underarms) for at least 48 hours prior to first CHG shower. It is OK to shave your face.  Please follow these instructions carefully.   1. Shower the NIGHT BEFORE SURGERY and the MORNING OF SURGERY with CHG.   2. If you chose to wash your hair, wash your hair first as usual with your normal shampoo.  3. After you shampoo, rinse your hair and body thoroughly to remove the shampoo.  4. Use CHG as you would any other liquid soap. You can apply CHG directly to the skin and wash gently with a scrungie or a clean washcloth.   5. Apply the CHG Soap to your body ONLY FROM THE NECK DOWN.  Do not use on open wounds or open sores. Avoid contact with your eyes, ears, mouth and genitals (private parts). Wash Face and genitals (private parts)  with your normal soap.  6. Wash thoroughly, paying special attention to the area where your surgery will be performed.  7. Thoroughly rinse your body with warm water from the neck down.  8. DO NOT shower/wash with your normal soap after using and rinsing off the CHG Soap.  9. Pat yourself dry with a CLEAN TOWEL.  10. Wear CLEAN PAJAMAS to bed the night before surgery, wear comfortable clothes the morning of surgery  11. Place CLEAN SHEETS on your bed the night of your first shower and DO NOT SLEEP WITH PETS.    Day of Surgery: Do not apply any deodorants/lotions. Please wear clean clothes to the hospital/surgery center.      Please read over the following fact sheets that you were given. Coughing and Deep Breathing, MRSA Information and Surgical Site Infection Prevention

## 2017-08-17 ENCOUNTER — Encounter (HOSPITAL_COMMUNITY)
Admission: RE | Admit: 2017-08-17 | Discharge: 2017-08-17 | Disposition: A | Discharge status: Home / Self Care | Payer: Medicare Other | Source: Ambulatory Visit | Attending: Cardiovascular Disease | Admitting: Cardiovascular Disease

## 2017-08-17 ENCOUNTER — Ambulatory Visit
Admission: RE | Admit: 2017-08-17 | Discharge: 2017-08-17 | Disposition: A | Discharge status: Home / Self Care | Payer: Medicare Other | Source: Ambulatory Visit | Attending: Radiation Oncology | Admitting: Radiation Oncology

## 2017-08-17 ENCOUNTER — Other Ambulatory Visit: Payer: Self-pay

## 2017-08-17 ENCOUNTER — Encounter: Payer: Self-pay | Admitting: Radiation Oncology

## 2017-08-17 ENCOUNTER — Ambulatory Visit (HOSPITAL_COMMUNITY)
Admission: RE | Admit: 2017-08-17 | Discharge: 2017-08-17 | Disposition: A | Discharge status: Home / Self Care | Payer: Medicare Other | Source: Ambulatory Visit | Attending: Cardiovascular Disease | Admitting: Cardiovascular Disease

## 2017-08-17 ENCOUNTER — Encounter (HOSPITAL_COMMUNITY): Payer: Self-pay

## 2017-08-17 ENCOUNTER — Ambulatory Visit: Payer: Medicare Other | Attending: Cardiovascular Disease | Admitting: Physical Therapy

## 2017-08-17 ENCOUNTER — Encounter: Payer: Self-pay | Admitting: Physical Therapy

## 2017-08-17 DIAGNOSIS — E119 Type 2 diabetes mellitus without complications: Secondary | ICD-10-CM | POA: Diagnosis not present

## 2017-08-17 DIAGNOSIS — E785 Hyperlipidemia, unspecified: Secondary | ICD-10-CM | POA: Insufficient documentation

## 2017-08-17 DIAGNOSIS — I10 Essential (primary) hypertension: Secondary | ICD-10-CM | POA: Insufficient documentation

## 2017-08-17 DIAGNOSIS — Z87891 Personal history of nicotine dependence: Secondary | ICD-10-CM | POA: Insufficient documentation

## 2017-08-17 DIAGNOSIS — Z8249 Family history of ischemic heart disease and other diseases of the circulatory system: Secondary | ICD-10-CM | POA: Diagnosis not present

## 2017-08-17 DIAGNOSIS — Z8673 Personal history of transient ischemic attack (TIA), and cerebral infarction without residual deficits: Secondary | ICD-10-CM | POA: Insufficient documentation

## 2017-08-17 DIAGNOSIS — I350 Nonrheumatic aortic (valve) stenosis: Secondary | ICD-10-CM

## 2017-08-17 DIAGNOSIS — Z7982 Long term (current) use of aspirin: Secondary | ICD-10-CM | POA: Insufficient documentation

## 2017-08-17 DIAGNOSIS — C642 Malignant neoplasm of left kidney, except renal pelvis: Secondary | ICD-10-CM | POA: Insufficient documentation

## 2017-08-17 DIAGNOSIS — K219 Gastro-esophageal reflux disease without esophagitis: Secondary | ICD-10-CM | POA: Diagnosis not present

## 2017-08-17 DIAGNOSIS — Z7984 Long term (current) use of oral hypoglycemic drugs: Secondary | ICD-10-CM | POA: Insufficient documentation

## 2017-08-17 DIAGNOSIS — Z01818 Encounter for other preprocedural examination: Secondary | ICD-10-CM | POA: Diagnosis not present

## 2017-08-17 DIAGNOSIS — Z79899 Other long term (current) drug therapy: Secondary | ICD-10-CM | POA: Diagnosis not present

## 2017-08-17 DIAGNOSIS — R2689 Other abnormalities of gait and mobility: Secondary | ICD-10-CM | POA: Insufficient documentation

## 2017-08-17 DIAGNOSIS — I252 Old myocardial infarction: Secondary | ICD-10-CM | POA: Diagnosis not present

## 2017-08-17 DIAGNOSIS — C250 Malignant neoplasm of head of pancreas: Secondary | ICD-10-CM

## 2017-08-17 DIAGNOSIS — I2510 Atherosclerotic heart disease of native coronary artery without angina pectoris: Secondary | ICD-10-CM | POA: Diagnosis not present

## 2017-08-17 LAB — COMPREHENSIVE METABOLIC PANEL
ALT: 11 U/L — ABNORMAL LOW (ref 17–63)
AST: 15 U/L (ref 15–41)
Albumin: 3.8 g/dL (ref 3.5–5.0)
Alkaline Phosphatase: 79 U/L (ref 38–126)
Anion gap: 9 (ref 5–15)
BUN: 20 mg/dL (ref 6–20)
CO2: 21 mmol/L — ABNORMAL LOW (ref 22–32)
Calcium: 9.2 mg/dL (ref 8.9–10.3)
Chloride: 102 mmol/L (ref 101–111)
Creatinine, Ser: 1.07 mg/dL (ref 0.61–1.24)
GFR calc Af Amer: >60 mL/min (ref >60)
GFR calc non Af Amer: >60 mL/min (ref >60)
Glucose, Bld: 375 mg/dL — ABNORMAL HIGH (ref 65–99)
Potassium: 4.4 mmol/L (ref 3.5–5.1)
Sodium: 132 mmol/L — ABNORMAL LOW (ref 135–145)
Total Bilirubin: 0.6 mg/dL (ref 0.3–1.2)
Total Protein: 6.1 g/dL — ABNORMAL LOW (ref 6.5–8.1)

## 2017-08-17 LAB — URINALYSIS, ROUTINE W REFLEX MICROSCOPIC
Bacteria, UA: NONE SEEN
Bilirubin Urine: NEGATIVE
Glucose, UA: >=500 mg/dL — AB
Hgb urine dipstick: NEGATIVE
Ketones, ur: NEGATIVE mg/dL
Leukocytes, UA: NEGATIVE
Nitrite: NEGATIVE
Protein, ur: NEGATIVE mg/dL
Specific Gravity, Urine: 1.02 (ref 1.005–1.030)
Squamous Epithelial / LPF: NONE SEEN
pH: 6 (ref 5.0–8.0)

## 2017-08-17 LAB — TYPE AND SCREEN
ABO/RH(D): A POS
Antibody Screen: NEGATIVE

## 2017-08-17 LAB — BLOOD GAS, ARTERIAL
Acid-base deficit: 0.1 mmol/L (ref 0.0–2.0)
Bicarbonate: 24 mmol/L (ref 20.0–28.0)
Drawn by: 421801
FIO2: 0.21
O2 Saturation: 97.5 %
Patient temperature: 98.6
pCO2 arterial: 38.6 mmHg (ref 32.0–48.0)
pH, Arterial: 7.409 (ref 7.350–7.450)
pO2, Arterial: 100 mmHg (ref 83.0–108.0)

## 2017-08-17 LAB — PROTIME-INR
INR: 1.03
Prothrombin Time: 13.4 seconds (ref 11.4–15.2)

## 2017-08-17 LAB — CBC
HCT: 39.1 % (ref 39.0–52.0)
Hemoglobin: 13 g/dL (ref 13.0–17.0)
MCH: 30 pg (ref 26.0–34.0)
MCHC: 33.2 g/dL (ref 30.0–36.0)
MCV: 90.3 fL (ref 78.0–100.0)
Platelets: 139 10*3/uL — ABNORMAL LOW (ref 150–400)
RBC: 4.33 MIL/uL (ref 4.22–5.81)
RDW: 13.8 % (ref 11.5–15.5)
WBC: 8.1 10*3/uL (ref 4.0–10.5)

## 2017-08-17 LAB — SURGICAL PCR SCREEN
MRSA, PCR: NEGATIVE
Staphylococcus aureus: NEGATIVE

## 2017-08-17 LAB — GLUCOSE, CAPILLARY: Glucose-Capillary: 398 mg/dL — ABNORMAL HIGH (ref 65–99)

## 2017-08-17 LAB — HEMOGLOBIN A1C
Hgb A1c MFr Bld: 10.4 % — ABNORMAL HIGH (ref 4.8–5.6)
Mean Plasma Glucose: 251.78 mg/dL

## 2017-08-17 LAB — ABO/RH: ABO/RH(D): A POS

## 2017-08-17 LAB — BRAIN NATRIURETIC PEPTIDE: B Natriuretic Peptide: 163.2 pg/mL — ABNORMAL HIGH (ref 0.0–100.0)

## 2017-08-17 LAB — APTT: aPTT: 29 seconds (ref 24–36)

## 2017-08-17 NOTE — Therapy (Signed)
[place], [place], [address] Phone: [phone]   Fax:  [phone]  Physical Therapy Evaluation  Patient Details  Name: Manuel Olson MRN: [id number] Date of Birth: Sep 02, 1946 Referring Provider: [name]   Encounter Date: 08/17/2017  PT End of Session - 08/17/17 1004    Visit Number  1    PT Start Time  1003    PT Stop Time  1035    PT Time Calculation (min)  32 min       Past Medical History:  Diagnosis Date  . Aortic stenosis   . Arthritis   . CAD (coronary artery disease)    DES to mid LAD, DES to diagonal, DES x 2 to proximal and mid RCA - March 2018, [facility]  . Cancer [facility])    cancer of left kidney, mass on pancreas  . Complication of anesthesia    slow to wake up with surgery - 20 years ago   . Dyspnea    with exertion   . Essential hypertension   . GERD (gastroesophageal reflux disease)   . Heart murmur   . Hyperlipidemia   . Myocardial infarction ([place])   . 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 Type 2 diabetes mellitus ([place])     Past Surgical History:  Procedure Laterality Date  . APPENDECTOMY    . EUS N/A 08/02/2017   Procedure: UPPER ENDOSCOPIC ULTRASOUND (EUS) RADIAL;  Surgeon: [name], MD;  Location: [facility] END[facility];  Service: Endoscopy;  Laterality: N/A;  . EYE SURGERY Left   . HAND SURGERY Bilateral   . HERNIA REPAIR Right   . RIGHT/LEFT HEART CATH AND CORONARY ANGIOGRAPHY N/A 07/05/2017   Procedure: RIGHT/LEFT HEART CATH AND CORONARY ANGIOGRAPHY;  Surgeon: [name], MD;  Location: [place];  Service: Cardiovascular;  Laterality: N/A;  . SHOULDER ARTHROSCOPY WITH ROTATOR CUFF REPAIR Left     There were no vitals filed for this visit.   Subjective Assessment - 08/17/17 1005    Subjective  Reports shortness of breath since summer 2018 walking in the yard up inclines. He also reports overall fatigues.     Patient Stated Goals   to fix heart    Currently in Pain?  No/denies         [facility] PT Assessment - 08/17/17 0001      Assessment   Medical Diagnosis  severe aortic stenosis    Referring Provider  [name]    Onset Date/Surgical Date  -- Summer 2018 approximate      Precautions   Precautions  None      Restrictions   Weight Bearing Restrictions  No      Balance Screen   Has the patient fallen in the past 6 months  No    Has the patient had a decrease in activity level because of a fear of falling?   No    Is the patient reluctant to leave their home because of a fear of falling?   No      Home Social worker  Private residence    Living Arrangements  Spouse/significant other    Home Access  Stairs to enter    Entrance Stairs-Number of Steps  2    [place]  Two level;Able to live on main level with bedroom/bathroom      Prior Function   Level of Independence  Independent with community mobility without  device      ROM / Strength   AROM / PROM / Strength  AROM;Strength      AROM   Overall AROM Comments  grossly WNL throughout      Strength   Overall Strength Comments  grossly 5/5 throughout    Strength Assessment Site  Hand    Right/Left hand  Left;Right    Right Hand Grip (lbs)  94 R hand dominant    Left Hand Grip (lbs)  86      Ambulation/Gait   Gait Comments  Pt ambulates independently without device and no signficant deviations noted. Pt's gait distance is limited by 29% for his age/gender.        [facility] Pre-Surgical Assessment - 08/17/17 0001    5 Meter Walk Test- trial 1  5 sec    5 Meter Walk Test- trial 2  4 sec.     5 Meter Walk Test- trial 3  5 sec.    5 meter walk test average  4.67 sec    4 Stage Balance Test tolerated for:   7 sec.    4 Stage Balance Test Position  3    Sit To Stand Test- trial 1  10 sec.    ADL/IADL Independent with:  Bathing;Dressing;Meal prep;Finances;Yard work    ADL/IADL Therapist, sports Index  Vulnerable    6 Minute Walk-  Baseline  yes    BP (mmHg)  149/88    HR (bpm)  70    02 Sat (%RA)  95 %    Modified Borg Scale for Dyspnea  0- Nothing at all    Perceived Rate of Exertion (Borg)  6-    6 Minute Walk Post Test  yes    BP (mmHg)  188/91  (Abnormal)     HR (bpm)  91    02 Sat (%RA)  97 %    Modified Borg Scale for Dyspnea  1- Very mild shortness of breath    Perceived Rate of Exertion (Borg)  8-    Aerobic Endurance Distance Walked  1215           Objective measurements completed on examination: See above findings.              PT Education - 08/17/17 1054    Education provided  Yes    Education Details  fall risk    Person(s) Educated  Patient;Spouse    Methods  Explanation    Comprehension  Verbalized understanding                  Plan - 08/17/17 1055    Clinical Impression Statement  see below    PT Frequency  One time visit      Clinical Impression Statement: Pt is a 71 yo male presenting to OP PT for evaluation prior to possible TAVR surgery due to severe aortic stenosis. Pt reports onset of shortness of breath with activity over the summer 2018. Symptoms are not limiting his activity significantly at this time. Pt presents with good ROM and strength, limited balance due to a childhood injury and is at high fall risk 4 stage balance test, good walking speed and fair to good aerobic endurance per 6 minute walk test. Pt ambulated a total of 1215 feet in 6 minute walk. BP increased significantly with 6 minute walk test. Based on the Short Physical Performance Battery, patient has a frailty rating of 11/12 with </= 5/12 considered frail.   Patient demonstrated the following  deficits and impairments:     Visit Diagnosis: Other abnormalities of gait and mobility  G-Codes - 07-Sep-2017 1055    Functional Assessment Tool Used (Outpatient Only)  6 minute walk 1215'    Functional Limitation  Mobility: Walking and moving around    Mobility: Walking and Moving Around Current  Status [phone])  At least 20 percent but less than 40 percent impaired, limited or restricted    Mobility: Walking and Moving Around Goal Status [phone])  At least 20 percent but less than 40 percent impaired, limited or restricted    Mobility: Walking and Moving Around Discharge Status [phone])  At least 20 percent but less than 40 percent impaired, limited or restricted        Problem List Patient Active Problem List   Diagnosis Date Noted  . Cancer of head of pancreas ([place]) 08/10/2017  . Pancreatic mass   . Severe aortic stenosis 07/05/2017    [place], PT 09/07/2017, 10:56 AM  [facility] [address] [place], [place], [address] Phone: [phone]   Fax:  [phone]  Name: Manuel Olson MRN: [id number] Date of Birth: [birth year]

## 2017-08-17 NOTE — Progress Notes (Addendum)
PCP - no PCP currently, was seeing Angelina Ok, to see new PCP in January per patient. (last office note and Hgb A1c requested from Angelina Ok) Cardiologist - Dr. Domenic Polite  Chest x-ray - 08/17/2017  EKG - 08/17/2017  ECHO - 06/19/17 Cardiac Cath - 07/05/17  Fasting Blood Sugar - Patient does not check CBG at home. CBG 398 today at PAT. Pt had not taken glipizide, ate a blueberry muffin at 7:00 AM and has been using candy to help prevent his dry throat since then. Pt encouraged to continue to monitor diet and to check CBG at home. Pt states he does not have a meter at home but may be able to get one. Does not remember last A1c. Pt and wife educated to call MD if CBG is consistently over 200 over the next few days.   Pt reports having cancer in his kidney and pancreas and needing to have TAVR prior to treatment for these cancers.   Blood Thinner Instructions: Pt wife states she thinks she was told to keep taking all medicine, but not to take the morning of surgery. Wife states she has Lauren's (TAVR nurse) number and will call her today to confirm if he needs to stop taking Aspirin or Plavix.   Anesthesia review: Chart forwarded to anesthesia d/t elevated CBG and hx of CAD.   Patient denies shortness of breath, fever, cough and chest pain at PAT appointment  Patient verbalized understanding of instructions that were given to them at the PAT appointment. Patient was also instructed that they will need to review over the PAT instructions again at home before surgery.  08/17/2017 10:28 AM Message sent to Theodosia Quay (TAVR RN) regarding patient lab work from today. Hgb A1 10.4 and elevated blood sugar of 398. Requested that she touch base with Patient's wife regarding Aspirin and Plavix as well.

## 2017-08-17 NOTE — Progress Notes (Signed)
Radiation Oncology         ([phone]) [phone] ________________________________  Name: Manuel Olson        MRN: [id number]  Date of Service: 08/17/2017 DOB: 05-Nov-1945  CC:[name], MD  [name], MD     REFERRING PHYSICIAN: [name], MD   DIAGNOSIS: The encounter diagnosis was Cancer of head of pancreas [facility]).   HISTORY OF PRESENT ILLNESS: Manuel Olson is a 71 y.o. male seen at the request of Dr. Barry Dienes for newly diagnosed pancreatic cancer. In March 2018, the patient was vacationing in [place] when he suffered from an AMI. While there he had four stents placed and was admitted for three days, and upon being discharged he was referred to Cardiology back here in [place]. Throughout his initial workup he had stent placement and more recently was found to have severe aortic stenosis. He subsequently underwent CT angiogram at the beginning of November as a part of his workup for TAVR in the near future, this revealed a poorly marginated 2.8 cm pancreatic neck mass with associated abrupt pancreatic duct dilation and a mass within the left kidney. These findings were highly worrisome for pancreatic adenocarcinoma. He additionally underwent MR abdomen and brain which showed the ill-defined lesion, measuring at 3 x 3 x 3.5 cm, within the head/neck of the pancreas. This also showed the presence of 6 x 7 x 7 cm heterogeneously enhancing mass along the upper pole of the left kidney with renal cell carcinoma as well. He subsequently had follow up with Dr Owens Loffler of GI who recommended upper endoscopy with pancreatic sampling. This was performed on 08/02/17 which confirmed the presence of adenocarcinoma within the pancreas. Following this, he was subsequently referred to Dr Barry Dienes of General Surgery who recommends neoadjuvant therapy. At the consultation of CT surgery, cardiology, and urology, he is okay to proceed with his scheduled TAVR on Dec 11th for his aortic stenosis prior to  beginning treatment for his pancreatic cancer. He will proceed with neoadjuvant therapy with Dr Learta Codding who has recommended FOLFIRINOX if he has good performance status following his upcoming procedure and following this would like [place] to consult for the potential options for radiotherapy prior to Dr Barry Dienes performing a pancreaticoduodenectomy. Of note, Dr. Alyson Ingles has also recommended removal of the left kidney as well.    PREVIOUS RADIATION THERAPY: No  PAST MEDICAL HISTORY:  Past Medical History:  Diagnosis Date  . Aortic stenosis   . Arthritis   . CAD (coronary artery disease)    DES to mid LAD, DES to diagonal, DES x 2 to proximal and mid RCA - March 2018, [facility] [place]  . Cancer [facility])    cancer of left kidney, mass on pancreas  . Complication of anesthesia    slow to wake up with surgery - 20 years ago   . Dyspnea    with exertion   . Essential hypertension   . GERD (gastroesophageal reflux disease)   . Heart murmur   . Hyperlipidemia   . Myocardial infarction ([place])   . 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 Type 2 diabetes mellitus ([place])        PAST SURGICAL HISTORY: Past Surgical History:  Procedure Laterality Date  . APPENDECTOMY    . EUS N/A 08/02/2017   Procedure: UPPER ENDOSCOPIC ULTRASOUND (EUS) RADIAL;  Surgeon: [name], MD;  Location: [facility];  Service: Endoscopy;  Laterality: N/A;  . EYE SURGERY Left   . HAND  SURGERY Bilateral   . HERNIA REPAIR Right   . RIGHT/LEFT HEART CATH AND CORONARY ANGIOGRAPHY N/A 07/05/2017   Procedure: RIGHT/LEFT HEART CATH AND CORONARY ANGIOGRAPHY;  Surgeon: [name], MD;  Location: [facility];  Service: Cardiovascular;  Laterality: N/A;  . SHOULDER ARTHROSCOPY WITH ROTATOR CUFF REPAIR Left      FAMILY HISTORY:  Family History  Problem Relation Age of Onset  . Lupus Mother   . CAD Brother   . Hypertension Brother      SOCIAL HISTORY:  reports that he quit smoking about 16 months ago. His  smoking use included cigarettes. He started smoking about 58 years ago. He has a 57.00 pack-year smoking history. he has never used smokeless tobacco. He reports that he does not drink alcohol or use drugs. He is married and currently lives in [place]. He has four children, one of whom has passed away unfortunately. He is a retired Designer, industrial/product and cop.    ALLERGIES: Patient has no known allergies.   MEDICATIONS:  Current Outpatient Medications  Medication Sig Dispense Refill  . aspirin EC 81 MG tablet Take 1 tablet (81 mg total) by mouth daily. 90 tablet 3  . atorvastatin (LIPITOR) 20 MG tablet Take 20 mg by mouth daily.    . carvedilol (COREG) 3.125 MG tablet Take 1 tablet (3.125 mg total) by mouth 2 (two) times daily with a meal. 60 tablet 6  . clopidogrel (PLAVIX) 75 MG tablet Take 1 tablet (75 mg total) by mouth daily. 90 tablet 0  . glipiZIDE (GLUCOTROL) 5 MG tablet Take 5 mg by mouth daily before breakfast.     . lisinopril (PRINIVIL,ZESTRIL) 10 MG tablet Take 1 tablet (10 mg total) by mouth daily. 30 tablet 6   No current facility-administered medications for this encounter.    REVIEW OF SYSTEMS: On review of systems, the patient reports that he is doing well overall. He denies any chest pain, shortness of breath, cough, fevers, chills, night sweats, unintended weight changes. He denies any bowel or bladder disturbances, and denies abdominal pain, nausea or vomiting. He denies any new musculoskeletal or joint aches or pains. A complete review of systems is obtained and is otherwise negative.  PHYSICAL EXAM:  Wt Readings from Last 3 Encounters:  08/17/17 175 lb (79.4 kg)  08/17/17 174 lb 8 oz (79.2 kg)  08/10/17 175 lb 9.6 oz (79.7 kg)   Temp Readings from Last 3 Encounters:  08/17/17 97.6 F (36.4 C) (Core)  08/17/17 97.9 F (36.6 C)  08/10/17 97.8 F (36.6 C) (Oral)   BP Readings from Last 3 Encounters:  08/17/17 (!) 144/56  08/17/17 (!) 147/95  08/10/17 (!)  161/84   Pulse Readings from Last 3 Encounters:  08/17/17 75  08/17/17 80  08/10/17 72   Pain Assessment Pain Score: 0-No pain/10  In general this is a well appearing caucasian male in no acute distress. He is alert and oriented x4 and appropriate throughout the examination. HEENT reveals that the patient is normocephalic, atraumatic. EOMs are intact. PERRLA. Skin is intact without any evidence of gross lesions. Cardiovascular exam reveals a regular rate and rhythm, with a holosystolic murmur consistent with aortic stenosis. Chest is clear to auscultation bilaterally. Lymphatic assessment is performed and does not reveal any adenopathy in the cervical, supraclavicular, axillary, or inguinal chains. Abdomen has active bowel sounds in all quadrants and is intact. The abdomen is soft, non tender, non distended. Lower extremities are negative for pretibial pitting edema, deep  calf tenderness, cyanosis or clubbing.  ECOG = 0  0 - Asymptomatic (Fully active, able to carry on all predisease activities without restriction)  1 - Symptomatic but completely ambulatory (Restricted in physically strenuous activity but ambulatory and able to carry out work of a light or sedentary nature. For example, light housework, office work)  2 - Symptomatic, <50% in bed during the day (Ambulatory and capable of all self care but unable to carry out any work activities. Up and about more than 50% of waking hours)  3 - Symptomatic, >50% in bed, but not bedbound (Capable of only limited self-care, confined to bed or chair 50% or more of waking hours)  4 - Bedbound (Completely disabled. Cannot carry on any self-care. Totally confined to bed or chair)  5 - Death   [name] Pen MM, Creech RH, Tormey DC, et al. [phone]). "Toxicity and response criteria of the [facility]". [place] Oncol. 5 (6): 649-55    LABORATORY DATA:  Lab Results  Component Value Date   WBC 8.1 08/17/2017   HGB 13.0  08/17/2017   HCT 39.1 08/17/2017   MCV 90.3 08/17/2017   PLT 139 (L) 08/17/2017   Lab Results  Component Value Date   NA 132 (L) 08/17/2017   K 4.4 08/17/2017   CL 102 08/17/2017   CO2 21 (L) 08/17/2017   Lab Results  Component Value Date   ALT 11 (L) 08/17/2017   AST 15 08/17/2017   ALKPHOS 79 08/17/2017   BILITOT 0.6 08/17/2017      RADIOGRAPHY: Dg Chest 2 View  Result Date: 08/17/2017 CLINICAL DATA:  Severe aortic stenosis. Preoperative evaluation for a transcatheter aortic valve replacement. EXAM: CHEST  2 VIEW COMPARISON:  06/26/2017 and chest CT 07/20/2017 FINDINGS: Stable dense nodule in the lateral right lung that probably represents a granuloma based on previous imaging. No airspace disease or pulmonary edema. Heart size is normal. There are coronary artery stents. Facet arthropathy in the lower cervical spine. Bridging osteophytes throughout the thoracic spine. No large pleural effusions. IMPRESSION: No active cardiopulmonary disease. Electronically Signed   By: Markus Daft M.D.   On: 08/17/2017 09:37   Mr Jeri Cos HU Contrast  Result Date: 07/29/2017 CLINICAL DATA:  Pancreatic mass concerning for adenocarcinoma. Left renal mass. EXAM: MRI HEAD WITHOUT AND WITH CONTRAST TECHNIQUE: Multiplanar, multiecho pulse sequences of the brain and surrounding structures were obtained without and with intravenous contrast. CONTRAST:  47m MULTIHANCE GADOBENATE DIMEGLUMINE 529 MG/ML IV SOLN COMPARISON:  None. FINDINGS: Brain: The diffusion-weighted images demonstrate no acute or subacute infarction. There is no acute hemorrhage or mass lesion. Periventricular and subcortical white matter changes bilaterally are moderately advanced for age. There is no associated enhancement. No focal enhancing lesions are present. Ventricles are proportionate to the degree of atrophy. No significant extra-axial fluid collection is present. Vascular: Flow is present in the major intracranial arteries. Skull and  upper cervical spine: The skullbase is within normal limits. The craniocervical junction is normal. Sinuses/Orbits: The paranasal sinuses and mastoid air cells are clear. Globes and orbits are within normal limits. IMPRESSION: 1. No evidence for metastatic disease to the brain or meninges. 2. The white matter disease is moderately advanced for age. This likely reflects the sequela of chronic microvascular ischemia. Electronically Signed   By: CSan MorelleM.D.   On: 07/29/2017 15:20   Mr Abdomen Wwo Contrast  Result Date: 07/28/2017 CLINICAL DATA:  Pancreatic lesion and left renal mass seen on recent CT  scan. EXAM: MRI ABDOMEN WITHOUT AND WITH CONTRAST TECHNIQUE: Multiplanar multisequence MR imaging of the abdomen was performed both before and after the administration of intravenous contrast. CONTRAST:  73m MULTIHANCE GADOBENATE DIMEGLUMINE 529 MG/ML IV SOLN COMPARISON:  CT scan 07/20/2017. FINDINGS: Lower chest:  Unremarkable. Hepatobiliary: No focal abnormality within the liver parenchyma. There is no evidence for gallstones, gallbladder wall thickening, or pericholecystic fluid. No intrahepatic or extrahepatic biliary dilation. Pancreas: As noted on the recent CT scan, a 3.3 x 3.5 cm ill-defined lesion is evident in the head/neck region of the pancreas. Main pancreatic duct abruptly terminates in this lesion and parenchyma in the body and tail of the pancreas is diffusely atrophic. Spleen: No splenomegaly. No focal mass lesion. Adrenals/Urinary Tract: No adrenal nodule or mass. 6 mm lesion lower pole right kidney is likely a cyst. 6.0 x 6.8 x 7.3 cm heterogeneously enhancing lesion in the upper pole of the left kidney shows central necrosis or cystic change. The lesion extends into the central sinus fat medially and through the posterior capsule. The left renal vein is patent. Stomach/Bowel: Stomach is nondistended. No gastric wall thickening. No evidence of outlet obstruction. No small bowel or  colonic dilatation within the visualized abdomen. Vascular/Lymphatic: No abdominal aortic aneurysm. Portal vein is patent. There is mass-effect on the portal splenic confluence by the pancreatic mass although the superior mesenteric vein and confluence appear patent. Splenic artery is attenuated centrally near the confluence. Celiac axis demonstrates well preserved perivascular fat plane as does the SMA with no abnormal soft tissue surrounding either vessel. Other: No intraperitoneal free fluid. Musculoskeletal: No abnormal marrow enhancement within the visualized bony anatomy. IMPRESSION: 1. 3 x 3 x 3.5 cm ill-defined lesion in the head/neck region of the pancreas with abrupt cut off of the main pancreatic duct and associated pancreatic parenchymal atrophy in the body and tail of pancreas. Imaging features highly suspicious for pancreatic adenocarcinoma. Consider EUS for further evaluation. PET-CT may also prove helpful. 2. 6 x 7 x 7 cm heterogeneously enhancing mass upper pole left kidney compatible with renal cell carcinoma. 3. No evidence for metastatic disease in the abdomen. Electronically Signed   By: EMisty StanleyM.D.   On: 07/28/2017 09:54   Mr 3d Recon At Scanner  Result Date: 07/28/2017 CLINICAL DATA:  Pancreatic lesion and left renal mass seen on recent CT scan. EXAM: MRI ABDOMEN WITHOUT AND WITH CONTRAST TECHNIQUE: Multiplanar multisequence MR imaging of the abdomen was performed both before and after the administration of intravenous contrast. CONTRAST:  134mMULTIHANCE GADOBENATE DIMEGLUMINE 529 MG/ML IV SOLN COMPARISON:  CT scan 07/20/2017. FINDINGS: Lower chest:  Unremarkable. Hepatobiliary: No focal abnormality within the liver parenchyma. There is no evidence for gallstones, gallbladder wall thickening, or pericholecystic fluid. No intrahepatic or extrahepatic biliary dilation. Pancreas: As noted on the recent CT scan, a 3.3 x 3.5 cm ill-defined lesion is evident in the head/neck region of  the pancreas. Main pancreatic duct abruptly terminates in this lesion and parenchyma in the body and tail of the pancreas is diffusely atrophic. Spleen: No splenomegaly. No focal mass lesion. Adrenals/Urinary Tract: No adrenal nodule or mass. 6 mm lesion lower pole right kidney is likely a cyst. 6.0 x 6.8 x 7.3 cm heterogeneously enhancing lesion in the upper pole of the left kidney shows central necrosis or cystic change. The lesion extends into the central sinus fat medially and through the posterior capsule. The left renal vein is patent. Stomach/Bowel: Stomach is nondistended. No gastric wall thickening. No  evidence of outlet obstruction. No small bowel or colonic dilatation within the visualized abdomen. Vascular/Lymphatic: No abdominal aortic aneurysm. Portal vein is patent. There is mass-effect on the portal splenic confluence by the pancreatic mass although the superior mesenteric vein and confluence appear patent. Splenic artery is attenuated centrally near the confluence. Celiac axis demonstrates well preserved perivascular fat plane as does the SMA with no abnormal soft tissue surrounding either vessel. Other: No intraperitoneal free fluid. Musculoskeletal: No abnormal marrow enhancement within the visualized bony anatomy. IMPRESSION: 1. 3 x 3 x 3.5 cm ill-defined lesion in the head/neck region of the pancreas with abrupt cut off of the main pancreatic duct and associated pancreatic parenchymal atrophy in the body and tail of pancreas. Imaging features highly suspicious for pancreatic adenocarcinoma. Consider EUS for further evaluation. PET-CT may also prove helpful. 2. 6 x 7 x 7 cm heterogeneously enhancing mass upper pole left kidney compatible with renal cell carcinoma. 3. No evidence for metastatic disease in the abdomen. Electronically Signed   By: Misty Stanley M.D.   On: 07/28/2017 09:54   Ct Coronary Morph W/cta Cor W/score W/ca W/cm &/or Wo/cm  Addendum Date: 07/21/2017   ADDENDUM REPORT:  07/21/2017 08:07 CLINICAL DATA:  Aortic stenosis EXAM: Cardiac TAVR CT TECHNIQUE: The patient was scanned on a Siemens 295 slice scanner. A 100 kV retrospective scan was triggered in the ascending thoracic aorta at 140 HU's. Gantry rotation speed was 250 msecs and collimation was .9 mm. No beta blockade or nitro were given. The 3D data set was reconstructed in 5% intervals of the R-R cycle. Systolic and diastolic phases were analyzed on a dedicated work station using MPR, MIP and VRT modes. The patient received 80 cc of contrast. FINDINGS: Aortic Valve:  Tri leaflet, calcified with restricted leaflet motion Aorta:  Moderate calcific atherosclerotic debris Sinotubular Junction:  28 mm Ascending Thoracic Aorta:  32 mm Aortic Arch:  26 mm Descending Thoracic Aorta:  25 mm Sinus of Valsalva Measurements: Non-coronary:  32 mm Right -coronary:  32 mm Left -coronary:  32 mm Coronary Artery Height above Annulus: Left Main:  14.8 mm above annulus Right Coronary:  16.3 mm above annulus Virtual Basal Annulus Measurements: Maximum/Minimum Diameter:  26.7 mm x 22.4 mm Perimeter:  76.7 mm Area:  453.7 mm2 Coronary Arteries:  Sufficient height above annulus for deployment Optimum Fluoroscopic Angle for Delivery: LAO 3 degrees Caudal 7 degrees IMPRESSION: 1) Calcified tri leaflet AV with annular area of 453 mm2 suitable for a 26 mm Sapien 3 valve and perimeter of 76.7 mm suitable for a 29 mm Evolut Pro valve 2) Optimum angiographic angle for deployment LAO 3 degrees Caudal 7 degrees 3) Coronary arteries sufficient height above annulus for deployment 4) Normal aortic root 3.2 cm with moderate calcific atherosclerosis Jenkins Rouge Electronically Signed   By: Jenkins Rouge M.D.   On: 07/21/2017 08:07   Result Date: 07/21/2017 EXAM: OVER-READ INTERPRETATION  CT CHEST The following report is an over-read performed by radiologist Dr. Samara Snide [facility], [place] on 07/20/2017. This over-read does not include interpretation of  cardiac or coronary anatomy or pathology. The coronary CTA interpretation by the cardiologist is attached. COMPARISON:  06/26/2017 chest radiograph. FINDINGS: Please see the separate report for the concurrent chest CT angiogram for further details regarding the significant extracardiac findings in the chest. IMPRESSION: Please see the separate report for the concurrent chest CT angiogram for further details regarding the significant extracardiac findings in the chest. Electronically Signed: By: Rinaldo Ratel  Poff M.D. On: 07/20/2017 17:30   Ct Angio Chest Aorta W &/or Wo Contrast  Result Date: 07/20/2017 CLINICAL DATA:  Severe symptomatic aortic stenosis. Pre-TAVR evaluation. EXAM: CT ANGIOGRAPHY CHEST, ABDOMEN AND PELVIS TECHNIQUE: Multidetector CT imaging through the chest, abdomen and pelvis was performed using the standard protocol during bolus administration of intravenous contrast. Multiplanar reconstructed images and MIPs were obtained and reviewed to evaluate the vascular anatomy. CONTRAST:  100 cc Isovue 370 IV. COMPARISON:  06/26/2017 chest radiograph. FINDINGS: CTA CHEST FINDINGS Cardiovascular: Top-normal heart size. Thickened coarsely calcified aortic valve. No significant pericardial fluid/thickening. Left anterior descending and right coronary atherosclerosis. Atherosclerotic nonaneurysmal thoracic aorta. No evidence of acute intramural hematoma, dissection, pseudoaneurysm or penetrating atherosclerotic ulcer in the thoracic aorta. Atherosclerotic aortic arch vessels with suggestion of a high-grade left subclavian artery stenosis just proximal to the left vertebral artery takeoff (series 16/ image 45) and a moderate stenosis in the proximal left common carotid artery. Normal caliber pulmonary arteries. No central pulmonary emboli. Mediastinum/Nodes: No discrete thyroid nodules. Unremarkable esophagus. No pathologically enlarged axillary, mediastinal or hilar lymph nodes. Lungs/Pleura: No pneumothorax.  No pleural effusion. Mild centrilobular and paraseptal emphysema with diffuse bronchial wall thickening. There is a 1.1 cm solid peripheral right lower lobe pulmonary nodule (series 15/ image 57), which may contain faint internal calcifications and appears radiographically stable back to 2009, compatible with a benign granuloma. There are clustered solid anterior right middle lobe pulmonary nodules, largest 6 mm (series 15/ image 65). No acute consolidative airspace disease, lung masses or additional significant pulmonary nodules. Musculoskeletal: No aggressive appearing focal osseous lesions. Marked thoracic spondylosis. CTA ABDOMEN AND PELVIS FINDINGS Hepatobiliary: Normal liver size. No liver masses. Normal gallbladder with no radiopaque cholelithiasis. No biliary ductal dilatation. Pancreas: There is a poorly marginated hypodense 2.8 x 1.9 cm pancreatic neck mass (series 14/ image 113). There is associated abrupt dilatation of the main pancreatic duct up to 5 mm diameter with associated mild parenchymal atrophy in the pancreatic body and tail. Spleen: Normal size. No mass. Adrenals/Urinary Tract: Normal adrenals. There is a heterogeneously avidly enhancing 7.5 x 6.9 x 7.6 cm renal cortical mass in the posterior upper left kidney (series 14/ image 112), which appears contained by Zuckerkandl's fascia posteriorly, with probable early tumor extension into the left renal vein at the left renal hilum (series 14/ image 116). Additional subcentimeter hypodense renal cortical lesions in both kidneys are too small to characterize. No hydronephrosis. Normal bladder. Stomach/Bowel: Grossly normal stomach. Normal caliber small bowel with no small bowel wall thickening. Appendectomy. Moderate sigmoid diverticulosis, with no large bowel wall thickening or pericolonic fat stranding. Vascular/Lymphatic: Atherosclerotic abdominal aorta with 3.1 cm infrarenal abdominal aortic aneurysm. No pathologically enlarged lymph nodes in  the abdomen or pelvis. Reproductive: Mildly enlarged prostate with nonspecific internal prostatic calcifications. Other: No pneumoperitoneum, ascites or focal fluid collection. Musculoskeletal: No aggressive appearing focal osseous lesions. Moderate chronic appearing L2 vertebral compression fracture. Moderate lumbar spondylosis. VASCULAR MEASUREMENTS PERTINENT TO TAVR: AORTA: Minimal Aortic Diameter -  17 x 16  mm (infrarenal abdominal aorta) Severity of Aortic Calcification -  severe Extensive ulcerated atherosclerotic plaque throughout the abdominal aorta. Infrarenal 3.1 cm abdominal aortic aneurysm. 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 Extensive ulcerated plaque. Right External Iliac Artery - Minimal Diameter - 7.8 x 5.5 mm Tortuosity - mild-to-moderate Calcification -  moderate to severe Ulcerated plaque. Note is made of a 2.4 cm right internal iliac artery aneurysm. Right Common Femoral Artery - Minimal Diameter - 8.6 x 6.3 mm Tortuosity - mild Calcification - moderate Ulcerated plaque. LEFT PELVIS: Left Common Iliac Artery - Minimal Diameter - 8.5 x 7.5 mm Tortuosity - mild-to-moderate Calcification - severe Ulcerated plaque. Left External Iliac Artery - Minimal Diameter - 6.2 x 5.6 mm Tortuosity - mild Calcification - severe Ulcerated plaque. Left Common Femoral Artery - Minimal Diameter - 7.5 x 7.0 mm Tortuosity - mild Calcification - moderate Ulcerated plaque. Review of the MIP images confirms the above findings. IMPRESSION: 1. Vascular findings and measurements pertinent to potential TAVR procedure, as detailed above. 2. Severe thickening and calcification of the aortic valve, compatible with the reported clinical history of severe aortic stenosis. 3. Poorly marginated hypodense 2.8 cm pancreatic neck mass with associated abrupt pancreatic  duct dilation and parenchymal atrophy in the pancreatic body and tail. These findings are highly worrisome for primary pancreatic adenocarcinoma. GI consultation is recommended for consideration of endoscopic ultrasound with FNA. MRI abdomen without and with IV contrast may be obtained for further characterization. 4. 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 Probable early tumor extension into the left renal vein at the left renal hilum. Urology consultation is recommended. This mass can also be further characterized on MRI abdomen without and with IV contrast. 5. No definite evidence of metastatic disease. Clustered right middle lobe solid pulmonary nodules, largest 6 mm, for which follow-up chest CT is recommended in 3 months. 6. 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 (ICD10-I71.9). Recommend follow-up aortic ultrasound in 3 years. This recommendation follows ACR consensus guidelines: White Paper of the ACR Incidental Findings Committee II on Vascular Findings. J Am Coll Radiol 2013; 10:789-794. 7. 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 8. Suggestion of a high-grade left subclavian artery stenosis just proximal to the left vertebral artery takeoff. Correlate clinically for any evidence of subclavian steal syndrome involving the left upper extremity. 9. Additional chronic findings include: Aortic Atherosclerosis (ICD10-I70.0) and Emphysema (ICD10-J43.9). Moderate sigmoid diverticulosis. Mild prostatomegaly. Moderate chronic appearing L2 vertebral compression fracture. These results will be called to the ordering clinician or representative by the Radiologist Assistant, and communication documented in the PACS or zVision Dashboard. Electronically Signed   By: Ilona Sorrel M.D.   On: 07/20/2017 17:28   Ct Angio Abd/pel W/ And/or W/o  Result Date:  07/20/2017 CLINICAL DATA:  Severe symptomatic aortic stenosis. Pre-TAVR evaluation. EXAM: CT ANGIOGRAPHY CHEST, ABDOMEN AND PELVIS TECHNIQUE: Multidetector CT imaging through the chest, abdomen and pelvis was performed using the standard protocol during bolus administration of intravenous contrast. Multiplanar reconstructed images and MIPs were obtained and reviewed to evaluate the vascular anatomy. CONTRAST:  100 cc Isovue 370 IV. COMPARISON:  06/26/2017 chest radiograph. FINDINGS: CTA CHEST FINDINGS Cardiovascular: Top-normal heart size. Thickened coarsely calcified aortic valve. No significant pericardial fluid/thickening. Left anterior descending and right coronary atherosclerosis. Atherosclerotic nonaneurysmal thoracic aorta. No evidence of acute intramural hematoma, dissection, pseudoaneurysm or penetrating atherosclerotic ulcer in the thoracic aorta. Atherosclerotic aortic arch vessels with suggestion of a high-grade left subclavian artery stenosis just proximal to the left vertebral artery takeoff (series 16/ image 45) and a moderate stenosis in the proximal left common carotid artery. Normal caliber pulmonary arteries. No central pulmonary emboli. Mediastinum/Nodes: No discrete thyroid nodules. Unremarkable esophagus.  No pathologically enlarged axillary, mediastinal or hilar lymph nodes. Lungs/Pleura: No pneumothorax. No pleural effusion. Mild centrilobular and paraseptal emphysema with diffuse bronchial wall thickening. There is a 1.1 cm solid peripheral right lower lobe pulmonary nodule (series 15/ image 57), which may contain faint internal calcifications and appears radiographically stable back to 2009, compatible with a benign granuloma. There are clustered solid anterior right middle lobe pulmonary nodules, largest 6 mm (series 15/ image 65). No acute consolidative airspace disease, lung masses or additional significant pulmonary nodules. Musculoskeletal: No aggressive appearing focal osseous lesions.  Marked thoracic spondylosis. CTA ABDOMEN AND PELVIS FINDINGS Hepatobiliary: Normal liver size. No liver masses. Normal gallbladder with no radiopaque cholelithiasis. No biliary ductal dilatation. Pancreas: There is a poorly marginated hypodense 2.8 x 1.9 cm pancreatic neck mass (series 14/ image 113). There is associated abrupt dilatation of the main pancreatic duct up to 5 mm diameter with associated mild parenchymal atrophy in the pancreatic body and tail. Spleen: Normal size. No mass. Adrenals/Urinary Tract: Normal adrenals. There is a heterogeneously avidly enhancing 7.5 x 6.9 x 7.6 cm renal cortical mass in the posterior upper left kidney (series 14/ image 112), which appears contained by Zuckerkandl's fascia posteriorly, with probable early tumor extension into the left renal vein at the left renal hilum (series 14/ image 116). Additional subcentimeter hypodense renal cortical lesions in both kidneys are too small to characterize. No hydronephrosis. Normal bladder. Stomach/Bowel: Grossly normal stomach. Normal caliber small bowel with no small bowel wall thickening. Appendectomy. Moderate sigmoid diverticulosis, with no large bowel wall thickening or pericolonic fat stranding. Vascular/Lymphatic: Atherosclerotic abdominal aorta with 3.1 cm infrarenal abdominal aortic aneurysm. No pathologically enlarged lymph nodes in the abdomen or pelvis. Reproductive: Mildly enlarged prostate with nonspecific internal prostatic calcifications. Other: No pneumoperitoneum, ascites or focal fluid collection. Musculoskeletal: No aggressive appearing focal osseous lesions. Moderate chronic appearing L2 vertebral compression fracture. Moderate lumbar spondylosis. VASCULAR MEASUREMENTS PERTINENT TO TAVR: AORTA: Minimal Aortic Diameter -  17 x 16  mm (infrarenal abdominal aorta) Severity of Aortic Calcification -  severe Extensive ulcerated atherosclerotic plaque throughout the abdominal aorta. Infrarenal 3.1 cm abdominal aortic  aneurysm. 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 Extensive ulcerated plaque. Right External Iliac Artery - Minimal Diameter - 7.8 x 5.5 mm Tortuosity - mild-to-moderate Calcification - moderate to severe Ulcerated plaque. Note is made of a 2.4 cm right internal iliac artery aneurysm. Right Common Femoral Artery - Minimal Diameter - 8.6 x 6.3 mm Tortuosity - mild Calcification - moderate Ulcerated plaque. LEFT PELVIS: Left Common Iliac Artery - Minimal Diameter - 8.5 x 7.5 mm Tortuosity - mild-to-moderate Calcification - severe Ulcerated plaque. Left External Iliac Artery - Minimal Diameter - 6.2 x 5.6 mm Tortuosity - mild Calcification - severe Ulcerated plaque. Left Common Femoral Artery - Minimal Diameter - 7.5 x 7.0 mm Tortuosity - mild Calcification - moderate Ulcerated plaque. Review of the MIP images confirms the above findings. IMPRESSION: 1. Vascular findings and measurements pertinent to potential TAVR procedure, as detailed above. 2. Severe thickening and calcification of the aortic valve, compatible with the reported clinical history of severe aortic stenosis. 3. Poorly marginated hypodense 2.8 cm pancreatic neck mass with associated abrupt pancreatic duct dilation and parenchymal atrophy in the pancreatic body and tail. These findings are highly worrisome for primary pancreatic adenocarcinoma. GI consultation is recommended  for consideration of endoscopic ultrasound with FNA. MRI abdomen without and with IV contrast may be obtained for further characterization. 4. 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 Probable early tumor extension into the left renal vein at the left renal hilum. Urology consultation is recommended. This mass  can also be further characterized on MRI abdomen without and with IV contrast. 5. No definite evidence of metastatic disease. Clustered right middle lobe solid pulmonary nodules, largest 6 mm, for which follow-up chest CT is recommended in 3 months. 6. 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 (ICD10-I71.9). Recommend follow-up aortic ultrasound in 3 years. This recommendation follows ACR consensus guidelines: White Paper of the ACR Incidental Findings Committee II on Vascular Findings. J Am Coll Radiol 2013; 10:789-794. 7. 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 8. Suggestion of a high-grade left subclavian artery stenosis just proximal to the left vertebral artery takeoff. Correlate clinically for any evidence of subclavian steal syndrome involving the left upper extremity. 9. Additional chronic findings include: Aortic Atherosclerosis (ICD10-I70.0) and Emphysema (ICD10-J43.9). Moderate sigmoid diverticulosis. Mild prostatomegaly. Moderate chronic appearing L2 vertebral compression fracture. These results will be called to the ordering clinician or representative by the Radiologist Assistant, and communication documented in the PACS or zVision Dashboard. Electronically Signed   By: Ilona Sorrel M.D.   On: 07/20/2017 17:28   IMPRESSION/PLAN: 1. 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 It was a pleasure meeting with the patient today to discuss the potential of radiotherapy in the ongoing management of his pancreatic cancer. Dr Lisbeth Renshaw discussed his imaging, pathology, and progress thus far in the course of his pancreatic cancer. I have also recommended and will refer the patient to Dr. Jerene Pitch of [facility] so that he may able to undergo fiducial marker placement in anticipation of SBRT. Following beginning systemic chemotherapy with Dr Benay Spice, we would recommend a short course of SBRT radiation therapy prior to his upcoming Whipple procedure  with Dr Barry Dienes. Dr Lisbeth Renshaw would recommend five treatments in total. The risks vs benefits, long and short term side effects, and potential toxicities were discussed at great length with the patient. I informed them that they may experience possible nausea, fatigue, loose stools, and radiation-related skin changes within their treatment field. The patient appears to understand and wishes to proceed with planned course of treatment. A signed consent form was placed in his chart. We have also rescheduled his PET/CT from Monday to tomorrow at [facility].  2. Possible genetic predisposition to malignancy. We will be referring him to genetics since his presentation is moderately concerning for Lynch syndrome.  3. Aortic Stenosis and recent MI. The patient will have his TAVR on Tuesday next week. We will follow him expectantly.   The above documentation reflects my direct findings during this shared patient visit. Please see the separate note by Dr. Lisbeth Renshaw on this date for the remainder of the patient's plan of care.    Carola Rhine, PAC  This document serves as a record of services personally performed by the care team consisting of Kyung Rudd, MD and Leonides Schanz, PA-C. It was created on their behalf by Reola Mosher, a trained medical scribe. The creation of this record is based on the scribe's personal observations and the provider's statements to them. This document has been checked and approved by the attending provider.

## 2017-08-17 NOTE — Progress Notes (Signed)
Please see the Nurse Progress Note in the MD Initial Consult Encounter for this patient. 

## 2017-08-18 ENCOUNTER — Encounter
Admission: RE | Admit: 2017-08-18 | Discharge: 2017-08-18 | Disposition: A | Discharge status: Home / Self Care | Payer: Medicare Other | Source: Ambulatory Visit | Attending: Thoracic Surgery (Cardiothoracic Vascular Surgery) | Admitting: Thoracic Surgery (Cardiothoracic Vascular Surgery)

## 2017-08-18 DIAGNOSIS — N2889 Other specified disorders of kidney and ureter: Secondary | ICD-10-CM | POA: Insufficient documentation

## 2017-08-18 DIAGNOSIS — C259 Malignant neoplasm of pancreas, unspecified: Secondary | ICD-10-CM

## 2017-08-18 LAB — GLUCOSE, CAPILLARY
Glucose-Capillary: 239 mg/dL — ABNORMAL HIGH (ref 65–99)
Glucose-Capillary: 264 mg/dL — ABNORMAL HIGH (ref 65–99)
Glucose-Capillary: 264 mg/dL — ABNORMAL HIGH (ref 65–99)

## 2017-08-18 NOTE — Progress Notes (Signed)
Anesthesia Chart Review:  Pt is a 71 year old male scheduled for TAVR on 08/22/2017 [name], MD  - No PCP at this time. Previous PCP was [name], NP. Last office visit 02/24/17.  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   Medications include: ASA 81 mg, Lipitor, carvedilol, Plavix, glipizide, lisinopril  BP (!) 147/95   Pulse 80   Temp 36.6 C   Resp 20   Ht 5' 10"  (1.778 m)   Wt 174 lb 8 oz (79.2 kg)   SpO2 97%   BMI 25.04 kg/m   Preoperative labs reviewed.   - HbA1c 10.4, glucose 375.  - Dr. Burt Knack is aware of uncontrolled DM.  Plan is to treat diabetes while inpatient for TAVR.   CXR 08/17/17:  - No active cardiopulmonary disease.  EKG 08/17/17: NSR. LVH with repolarization abnormality   CT coronary morphology 07/20/17:  1) Calcified tri leaflet AV with annular area of 453 mm2 suitable for a 26 mm Sapien 3 valve and perimeter of 76.7 mm suitable for a 29 mm Evolut Pro valve 2) Optimum angiographic angle for deployment LAO 3 degrees Caudal 7 degrees 3) Coronary arteries sufficient height above annulus for deployment 4) Normal aortic root 3.2 cm with moderate calcific atherosclerosis  CT angio chest, abdomen, pelvis 07/20/17:  1. Vascular findings and measurements pertinent to potential TAVR procedure, as detailed above. 2. Severe thickening and calcification of the aortic valve, compatible with the reported clinical history of severe aortic stenosis. 3. Poorly marginated hypodense 2.8 cm pancreatic neck mass with associated abrupt pancreatic duct dilation and parenchymal atrophy in the pancreatic body and tail, highly worrisome for primary pancreatic adenocarcinoma.  4. 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 Probable early tumor extension into the left renal vein at the left renal hilum. Urology consultation is recommended.  5. No definite evidence of metastatic disease. Clustered right middle lobe solid pulmonary nodules, largest 6 mm, for which follow-up chest CT is recommended in 3 months. 6. 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 Recommend follow-up aortic ultrasound in 3 years.  7. 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 8. Suggestion of a high-grade left subclavian artery stenosis just proximal to the left vertebral artery takeoff. Correlate clinically for any evidence of subclavian steal syndrome involving the left upper extremity. 9. Additional chronic findings include: Aortic Atherosclerosis and Emphysema. Moderate sigmoid diverticulosis. Mild prostatomegaly. Moderate chronic appearing L2 vertebral compression fracture.  Cardiac cath 07/05/17:  1. Multivessel coronary artery disease with continued patency of the stented segments in the LAD, first diagonal branch, and mid RCA 2. Mild diffuse nonobstructive residual CAD  3. Normal right heart hemodynamics 4. Severe aortic stenosis with a peak to peak gradient of 42 mmHg and a mean gradient of 33 mmHg  Echo 06/19/17:  - Left ventricle: The cavity size was normal. There was mild concentric hypertrophy. Systolic function was normal. The estimated ejection fraction was in the range of 55% to 60%. Doppler parameters are consistent with abnormal left ventricular relaxation (grade 1 diastolic dysfunction). - Aortic valve: There was severe stenosis. There was mild regurgitation. Valve area (VTI): 0.61 cm^2. Valve  area (Vmax): 0.62 cm^2. Valve area (Vmean): 0.59 cm^2. - Mitral valve: Calcified annulus. Mildly thickened leaflets. - Left atrium: The atrium was mildly dilated. - Atrial septum: No defect or patent foramen  ovale was identified.   Pt with DES x4 placed last March.  Severe AS.  During work up for TAVR, renal cell carcinoma and pancreatic cancer detected.  Per oncology note, plan is for TAVR first, followed by neoadjuvant treatment for cancer, followed by resection of pancreatic mass and nephrectomy in the future. Dr. Burt Knack is aware of uncontrolled DM and plans to address during hospitalization. I will also place referral for diabetes coordinator to see pt while in hospital.   If no changes, I anticipate pt can proceed with surgery as scheduled.   Willeen Cass, FNP-BC [facility]/Anesthesiology Phone: [phone] 08/18/2017 2:15 PM

## 2017-08-19 NOTE — Telephone Encounter (Signed)
This encounter was created in error - please disregard.

## 2017-08-21 ENCOUNTER — Institutional Professional Consult (permissible substitution) (INDEPENDENT_AMBULATORY_CARE_PROVIDER_SITE_OTHER): Payer: Medicare Other | Admitting: Surgery

## 2017-08-21 ENCOUNTER — Ambulatory Visit: Payer: Medicare Other | Admitting: Physical Therapy

## 2017-08-21 ENCOUNTER — Other Ambulatory Visit: Payer: Self-pay

## 2017-08-21 ENCOUNTER — Ambulatory Visit (HOSPITAL_COMMUNITY): Payer: Medicare Other

## 2017-08-21 ENCOUNTER — Encounter: Payer: Self-pay | Admitting: Surgery

## 2017-08-21 VITALS — BP 156/92 | HR 82 | Resp 18 | Ht 70.0 in | Wt 175.0 lb

## 2017-08-21 DIAGNOSIS — I350 Nonrheumatic aortic (valve) stenosis: Secondary | ICD-10-CM

## 2017-08-21 DIAGNOSIS — I25119 Atherosclerotic heart disease of native coronary artery with unspecified angina pectoris: Secondary | ICD-10-CM

## 2017-08-21 MED ORDER — SODIUM CHLORIDE 0.9 % IV SOLN
30.0000 ug/min | INTRAVENOUS | Status: DC
  Filled 2017-08-21: qty 2

## 2017-08-21 MED ORDER — SODIUM CHLORIDE 0.9 % IV SOLN
INTRAVENOUS | Status: DC
  Administered 2017-08-22: 11:00:00 via INTRAVENOUS

## 2017-08-21 MED ORDER — DOPAMINE-DEXTROSE 3.2-5 MG/ML-% IV SOLN
0.0000 ug/kg/min | INTRAVENOUS | Status: DC
  Filled 2017-08-21: qty 250

## 2017-08-21 MED ORDER — CHLORHEXIDINE GLUCONATE 0.12 % MT SOLN
15.0000 mL | Freq: Once | OROMUCOSAL | Status: AC
  Administered 2017-08-22: 15 mL via OROMUCOSAL
  Filled 2017-08-21: qty 15

## 2017-08-21 MED ORDER — VANCOMYCIN HCL 10 G IV SOLR
1250.0000 mg | INTRAVENOUS | Status: AC
  Administered 2017-08-22: 1500 mg via INTRAVENOUS
  Filled 2017-08-21: qty 1250

## 2017-08-21 MED ORDER — POTASSIUM CHLORIDE 2 MEQ/ML IV SOLN
80.0000 meq | INTRAVENOUS | Status: DC
  Filled 2017-08-21: qty 40

## 2017-08-21 MED ORDER — INSULIN REGULAR HUMAN 100 UNIT/ML IJ SOLN
INTRAMUSCULAR | Status: DC
  Filled 2017-08-21: qty 1

## 2017-08-21 MED ORDER — HEPARIN SODIUM (PORCINE) 1000 UNIT/ML IJ SOLN
INTRAMUSCULAR | Status: DC
  Filled 2017-08-21: qty 30

## 2017-08-21 MED ORDER — NITROGLYCERIN IN D5W 200-5 MCG/ML-% IV SOLN
2.0000 ug/min | INTRAVENOUS | Status: DC
  Filled 2017-08-21: qty 250

## 2017-08-21 MED ORDER — DEXMEDETOMIDINE HCL IN NACL 400 MCG/100ML IV SOLN
0.1000 ug/kg/h | INTRAVENOUS | Status: AC
  Administered 2017-08-22: 8 ug/kg/h via INTRAVENOUS
  Filled 2017-08-21: qty 100

## 2017-08-21 MED ORDER — EPINEPHRINE PF 1 MG/ML IJ SOLN
0.0000 ug/min | INTRAVENOUS | Status: DC
  Filled 2017-08-21: qty 4

## 2017-08-21 MED ORDER — MAGNESIUM SULFATE 50 % IJ SOLN
40.0000 meq | INTRAMUSCULAR | Status: DC
  Filled 2017-08-21: qty 9.85

## 2017-08-21 MED ORDER — NOREPINEPHRINE BITARTRATE 1 MG/ML IV SOLN
0.0000 ug/min | INTRAVENOUS | Status: AC
  Administered 2017-08-22: 1 ug/min via INTRAVENOUS
  Filled 2017-08-21: qty 4

## 2017-08-21 MED ORDER — DEXTROSE 5 % IV SOLN
1.5000 g | INTRAVENOUS | Status: AC
  Administered 2017-08-22: 1.5 g via INTRAVENOUS
  Filled 2017-08-21: qty 1.5

## 2017-08-21 NOTE — Progress Notes (Signed)
Patient ID: Manuel Olson, male   DOB: 11-Jul-1946, 71 y.o.   MRN: [id number]  [facility] CONSULTATION REPORT  Referring Provider is Manuel Mocha, MD PCP is Manuel Mocha, MD  Chief Complaint  Patient presents with  . New Patient (Initial Visit)    2nd TAVR consult    HPI:  The patient is a 71 year old gentleman with history of hypertension, hyperlipidemia, type 2 diabetes, long history of prior heavy smoking, and coronary artery disease who was traveling in [place] in March 2018 and suffered an acute ST segment elevation MI.  He was taken by his wife to [facility] and underwent emergent catheterization showing 70% stenosis in the mid LAD, occlusion of a large diagonal branch, 90% proximal right coronary stenosis, and 60-70% mid right coronary stenosis.  Left ventricular function was preserved.  He underwent PCI with drug-eluting stents in the mid LAD, diagonal branch, and proximal and mid right coronary artery.  He was told that he also had aortic stenosis and was to follow-up with a cardiologist as soon as he returned to [facility].  He reports a long history of a heart murmur which was never worked up.  Upon returning he was evaluated by Dr. Domenic Polite in April and underwent an echocardiogram showing severe aortic stenosis with a mean gradient of 49 mmHg and a dimensionless index of 0.18.  His ejection fraction was 55-60%.  He says that he was feeling fairly well at that time and quit smoking.  He saw Dr. Domenic Polite again in early October and a repeat echocardiogram showed a mean aortic valve gradient of 43 mmHg with a dimensionless index of 0.22.  Left ventricular ejection fraction remains normal at 55-60%.  He was referred to Dr. Burt Knack and underwent cardiac catheterization on 07/05/2017 which showed continued patency of the stented segments in his LAD, first diagonal, and right  coronary artery.  There was mild diffuse nonobstructive residual coronary artery disease.  The mean aortic valve gradient was measured at 33 mmHg with a peak to peak gradient of 42 mmHg.  He says that over the course of the summer he developed progressive exertional shortness of breath and fatigue.  This does not occur with normal walking on level ground but if he walks uphill or does any strenuous activity he notices it immediately.  He has not had any chest pain or pressure.  He denies any dizziness or syncope.  He has had no orthopnea.  His wife says that he has not been as active since he returned home from [place] and wants to rest more.  After being seen by Dr. Burt Knack he underwent workup for TAVR.  His CT scan showed a mass in the head of his pancreas as well as a large left renal tumor.  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  He has been seen by Dr. Julieanne Manson as well as by 1 of the urologist.  He has been evaluated by Dr. Barry Dienes and the plan is for neoadjuvant chemotherapy and possibly radiotherapy with subsequent resection of the pancreatic mass as well as left nephrectomy.  The patient is married and lives in [place] with his wife who is with him today.  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  Past Medical History:  Diagnosis Date  . Aortic stenosis   . Arthritis   .  CAD (coronary artery disease)    DES to mid LAD, DES to diagonal, DES x 2 to proximal and mid RCA - March 2018, [facility] [place]  . Cancer [facility])    cancer of left kidney, mass on pancreas  . Complication of anesthesia    slow to wake up with surgery - 20 years ago   . Dyspnea    with exertion   . Essential hypertension   . GERD (gastroesophageal reflux disease)   . Heart murmur   . Hyperlipidemia   . Myocardial infarction ([place])   . 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 Type 2 diabetes  mellitus ([place])     Past Surgical History:  Procedure Laterality Date  . APPENDECTOMY    . EUS N/A 08/02/2017   Procedure: UPPER ENDOSCOPIC ULTRASOUND (EUS) RADIAL;  Surgeon: [name], MD;  Location: [facility];  Service: Endoscopy;  Laterality: N/A;  . EYE SURGERY Left   . HAND SURGERY Bilateral   . HERNIA REPAIR Right   . RIGHT/LEFT HEART CATH AND CORONARY ANGIOGRAPHY N/A 07/05/2017   Procedure: RIGHT/LEFT HEART CATH AND CORONARY ANGIOGRAPHY;  Surgeon: Manuel Mocha, MD;  Location: [facility];  Service: Cardiovascular;  Laterality: N/A;  . SHOULDER ARTHROSCOPY WITH ROTATOR CUFF REPAIR Left     Family History  Problem Relation Age of Onset  . Lupus Mother   . CAD Brother   . Hypertension Brother     Social History   Socioeconomic History  . Marital status: Married    Spouse name: Not on file  . Number of children: Not on file  . Years of education: Not on file  . Highest education level: Not on file  Social Needs  . Financial resource strain: Not on file  . Food insecurity - worry: Not on file  . Food insecurity - inability: Not on file  . Transportation needs - medical: Not on file  . Transportation needs - non-medical: Not on file  Occupational History  . Not on file  Tobacco Use  . Smoking status: Former Smoker    Packs/day: 1.00    Years: 57.00    Pack years: 57.00    Types: Cigarettes    Start date: 09/12/1958    Last attempt to quit: 04/12/2016    Years since quitting: 1.3  . Smokeless tobacco: Never Used  Substance and Sexual Activity  . Alcohol use: No  . Drug use: No  . Sexual activity: Not on file  Other Topics Concern  . Not on file  Social History Narrative  . Not on file    Current Outpatient Medications  Medication Sig Dispense Refill  . aspirin EC 81 MG tablet Take 1 tablet (81 mg total) by mouth daily. 90 tablet 3  . atorvastatin (LIPITOR) 20 MG tablet Take 20 mg by mouth daily.    . carvedilol (COREG) 3.125 MG tablet Take  1 tablet (3.125 mg total) by mouth 2 (two) times daily with a meal. 60 tablet 6  . clopidogrel (PLAVIX) 75 MG tablet Take 1 tablet (75 mg total) by mouth daily. 90 tablet 0  . glipiZIDE (GLUCOTROL) 5 MG tablet Take 5 mg by mouth daily before breakfast.     . lisinopril (PRINIVIL,ZESTRIL) 10 MG tablet Take 1 tablet (10 mg total) by mouth daily. 30 tablet 6   No current facility-administered medications for this visit.    Facility-Administered Medications Ordered in Other Visits  Medication Dose Route Frequency Provider Last  Rate Last Dose  . [START ON 08/22/2017] 0.9 %  sodium chloride infusion   Intravenous Continuous Burnell Blanks, MD      . Derrill Memo ON 08/22/2017] cefUROXime (ZINACEF) 1.5 g in dextrose 5 % 50 mL IVPB  1.5 g Intravenous To OR Gaye Pollack, MD      . Derrill Memo ON 08/22/2017] chlorhexidine (PERIDEX) 0.12 % solution 15 mL  15 mL Mouth/Throat Once Gaye Pollack, MD      . Derrill Memo ON 08/22/2017] dexmedetomidine (PRECEDEX) 400 MCG/100ML (4 mcg/mL) infusion  0.1-0.7 mcg/kg/hr Intravenous To OR Gaye Pollack, MD      . Derrill Memo ON 08/22/2017] DOPamine (INTROPIN) 800 mg in dextrose 5 % 250 mL (3.2 mg/mL) infusion  0-10 mcg/kg/min Intravenous To OR Gaye Pollack, MD      . Derrill Memo ON 08/22/2017] EPINEPHrine (ADRENALIN) 4 mg in dextrose 5 % 250 mL (0.016 mg/mL) infusion  0-10 mcg/min Intravenous To OR Gaye Pollack, MD      . Derrill Memo ON 08/22/2017] heparin 30,000 units/NS 1000 mL solution for CELLSAVER   Other To OR Gaye Pollack, MD      . Derrill Memo ON 08/22/2017] insulin regular (NOVOLIN R,HUMULIN R) 100 Units in sodium chloride 0.9 % 100 mL (1 Units/mL) infusion   Intravenous To OR Gaye Pollack, MD      . Derrill Memo ON 08/22/2017] magnesium sulfate (IV Push/IM) injection 40 mEq  40 mEq Other To OR Gaye Pollack, MD      . Derrill Memo ON 08/22/2017] nitroGLYCERIN 50 mg in dextrose 5 % 250 mL (0.2 mg/mL) infusion  2-200 mcg/min Intravenous To OR Gaye Pollack, MD      . Derrill Memo ON  08/22/2017] norepinephrine (LEVOPHED) 4 mg in dextrose 5 % 250 mL (0.016 mg/mL) infusion  0-10 mcg/min Intravenous To OR Gaye Pollack, MD      . Derrill Memo ON 08/22/2017] phenylephrine (NEO-SYNEPHRINE) 20 mg in sodium chloride 0.9 % 250 mL (0.08 mg/mL) infusion  30-200 mcg/min Intravenous To OR Gaye Pollack, MD      . Derrill Memo ON 08/22/2017] potassium chloride injection 80 mEq  80 mEq Other To OR Gaye Pollack, MD      . Derrill Memo ON 08/22/2017] vancomycin (VANCOCIN) 1,250 mg in sodium chloride 0.9 % 250 mL IVPB  1,250 mg Intravenous To OR Vaneta Hammontree, Fernande Boyden, MD        No Known Allergies    Review of Systems:   General:  normal appetite, decreased energy, no weight gain, no weight loss, no fever  Cardiac:  no chest pain with exertion, no chest pain at rest, has SOB with moderate exertion, no resting SOB, no PND, no orthopnea, no palpitations, no arrhythmia, no atrial fibrillation, no LE edema, no dizzy spells, no syncope  Respiratory:  exertional shortness of breath, no home oxygen, no productive cough, has dry cough, no bronchitis, no wheezing, no hemoptysis, no asthma, no pain with inspiration or cough, no sleep apnea, no CPAP at night  GI:   no difficulty swallowing, no reflux, has frequent heartburn, no hiatal hernia, no abdominal pain, no constipation, no diarrhea, no hematochezia, no hematemesis, no melena  GU:   no dysuria,  no frequency, no urinary tract infection, no hematuria, no enlarged prostate, no kidney stones, has kidney disease (left renal mass)  Vascular:  no pain suggestive of claudication, no pain in feet, no leg cramps, no varicose veins, no DVT, no non-healing foot ulcer  Neuro:   no stroke,  no TIA's, no seizures, no headaches, no temporary blindness one eye,  no slurred speech, no peripheral neuropathy, no chronic pain, no instability of gait, has mild memory/cognitive dysfunction  Musculoskeletal: has arthritis in hands and back, no joint swelling, no myalgias, no difficulty  walking, normal mobility   Skin:   no rash, no itching, no skin infections, no pressure sores or ulcerations  Psych:   no anxiety, no depression, no nervousness, no unusual recent stress  Eyes:   no blurry vision, no floaters, no recent vision changes,  wears glasses   ENT:   has hearing loss and bilateral hearing aids, no loose or painful teeth, full dentures  Hematologic:  has easy bruising, no abnormal bleeding, no clotting disorder, no frequent epistaxis  Endocrine:  has diabetes, does not check CBG's at home       Physical Exam:   BP (!) 156/92   Pulse 82   Resp 18   Ht 5' 10"  (1.778 m)   Wt 175 lb (79.4 kg)   SpO2 96% Comment: on RA  BMI 25.11 kg/m   General:   well-appearing  HEENT:  NCAT, PERLA, EOMI, oropharynx clear  Neck:   no JVD, no bruits, no adenopathy or thyromegaly  Chest:   clear to auscultation, symmetrical breath sounds, no wheezes, no rhonchi   CV:   RRR, grade III/VI crescendo/decrescendo murmur heard best at RSB,  no diastolic murmur  Abdomen:  soft, non-tender, no masses or organomegaly  Extremities:  warm, well-perfused, pulses palpable in feet, no LE edema  Rectal/GU  Deferred  Neuro:   Grossly non-focal and symmetrical throughout  Skin:   Clean and dry, no rashes, no breakdown   Diagnostic Tests:    *[facility]*                         [address], [place] [address]                            [phone]  ------------------------------------------------------------------- Transthoracic Echocardiography  Patient:    Keishaun, Hazel MR #:       [id number] Study Date: 06/19/2017 Gender:     M Age:        31 Height:     175.3 cm Weight:     81 kg BSA:        2 m^2 Pt. Status: Room:   ATTENDING    Rozann Lesches, M.D.  ORDERING     Rozann Lesches, M.D.  REFERRING    Rozann Lesches, M.D.  PERFORMING   [facility], [facility]  SONOGRAPHER  Alvino Chapel,  RCS  cc:  ------------------------------------------------------------------- LV EF: 55% -   60%  ------------------------------------------------------------------- Indications:      Aortic stenosis 424.1.  ------------------------------------------------------------------- History:   PMH:  Acquired from the patient and from the patient&'s chart.  Coronary artery disease.  PMH:  Murmur since childhood. Myocardial infarction.  Risk factors:  Hypertension. Diabetes mellitus. Dyslipidemia.  ------------------------------------------------------------------- Study Conclusions  - Left ventricle: The cavity size was normal. There was mild   concentric hypertrophy. Systolic function was normal. The   estimated ejection fraction was in the range of 55% to 60%.   Doppler parameters are consistent with abnormal left ventricular   relaxation (grade 1  diastolic dysfunction). - Aortic valve: There was severe stenosis. There was mild   regurgitation. Valve area (VTI): 0.61 cm^2. Valve area (Vmax):   0.62 cm^2. Valve area (Vmean): 0.59 cm^2. - Mitral valve: Calcified annulus. Mildly thickened leaflets . - Left atrium: The atrium was mildly dilated. - Atrial septum: No defect or patent foramen ovale was identified.  ------------------------------------------------------------------- Study data:  Comparison was made to the study of 12/30/2016.  Study status:  Routine.  Procedure:  The patient reported no pain pre or post test. Transthoracic echocardiography. Image quality was adequate.  Study completion:  There were no complications. Transthoracic echocardiography.  M-mode, complete 2D, spectral Doppler, and color Doppler.  Birthdate:  Patient birthdate: [birth year].  Age:  Patient is 71 yr old.  Sex:  Gender: male. BMI: 26.4 kg/m^2.  Blood pressure:     164/78  Patient status: Inpatient.  Study date:  Study date: 06/19/2017. Study time: 09:26 AM.  Location:  Echo  laboratory.  -------------------------------------------------------------------  ------------------------------------------------------------------- Left ventricle:  The cavity size was normal. There was mild concentric hypertrophy. Systolic function was normal. The estimated ejection fraction was in the range of 55% to 60%. Doppler parameters are consistent with abnormal left ventricular relaxation (grade 1 diastolic dysfunction).  ------------------------------------------------------------------- Aortic valve:   Trileaflet; severely thickened, severely calcified leaflets.  Doppler:   There was severe stenosis.   There was mild regurgitation.    VTI ratio of LVOT to aortic valve: 0.21. Valve area (VTI): 0.61 cm^2. Indexed valve area (VTI): 0.31 cm^2/m^2. Peak velocity ratio of LVOT to aortic valve: 0.22. Valve area (Vmax): 0.62 cm^2. Indexed valve area (Vmax): 0.31 cm^2/m^2. Mean velocity ratio of LVOT to aortic valve: 0.21. Valve area (Vmean): 0.59 cm^2. Indexed valve area (Vmean): 0.29 cm^2/m^2.    Mean gradient (S): 43 mm Hg. Peak gradient (S): 72 mm Hg.  ------------------------------------------------------------------- Aorta:  The aorta was normal, not dilated, and non-diseased.  ------------------------------------------------------------------- Mitral valve:   Calcified annulus. Mildly thickened leaflets . Doppler:  There was trivial regurgitation.  ------------------------------------------------------------------- Left atrium:  The atrium was mildly dilated.  ------------------------------------------------------------------- Atrial septum:  No defect or patent foramen ovale was identified.   ------------------------------------------------------------------- Right ventricle:  The cavity size was normal. Wall thickness was normal. Systolic function was normal.  ------------------------------------------------------------------- Tricuspid valve:   Doppler:   There was mild regurgitation.  ------------------------------------------------------------------- Right atrium:  The atrium was normal in size.  ------------------------------------------------------------------- Pericardium:  The pericardium was normal in appearance.  ------------------------------------------------------------------- Systemic veins: Inferior vena cava: The vessel was dilated. The respirophasic diameter changes were blunted (< 50%), consistent with elevated central venous pressure.  ------------------------------------------------------------------- Post procedure conclusions Ascending Aorta:  - The aorta was normal, not dilated, and non-diseased.  ------------------------------------------------------------------- Measurements   Left ventricle                           Value          Reference  LV ID, ED, PLAX chordal          (H)     53.1  mm       43 - 52  LV ID, ES, PLAX chordal          (H)     38.1  mm       23 - 38  LV fx shortening, PLAX chordal   (L)     28    %        >=29  LV PW  thickness, ED                      14.1  mm       ----------  IVS/LV PW ratio, ED                      0.91           <=1.3  Stroke volume, 2D                        67    ml       ----------  Stroke volume/bsa, 2D                    34    ml/m^2   ----------  LV ejection fraction, 1-p A4C            53    %        ----------  LV end-diastolic volume, 2-p             94    ml       ----------  LV end-systolic volume, 2-p              45    ml       ----------  LV ejection fraction, 2-p                52    %        ----------  Stroke volume, 2-p                       49    ml       ----------  LV end-diastolic volume/bsa, 2-p         47    ml/m^2   ----------  LV end-systolic volume/bsa, 2-p          22    ml/m^2   ----------  Stroke volume/bsa, 2-p                   24.6  ml/m^2   ----------  LV e&', lateral                           4.35  cm/s     ----------  LV  E/e&', lateral                         14.39          ----------  LV e&', medial                            3.48  cm/s     ----------  LV E/e&', medial                          17.99          ----------  LV e&', average                           3.92  cm/s     ----------  LV E/e&', average                         15.99          ----------  Ventricular septum                       Value          Reference  IVS thickness, ED                        12.8  mm       ----------    LVOT                                     Value          Reference  LVOT ID, S                               19    mm       ----------  LVOT area                                2.84  cm^2     ----------  LVOT peak velocity, S                    93.3  cm/s     ----------  LVOT mean velocity, S                    62.6  cm/s     ----------  LVOT VTI, S                              23.7  cm       ----------  LVOT peak gradient, S                    3     mm Hg    ----------    Aortic valve                             Value          Reference  Aortic valve peak velocity, S            425   cm/s     ----------  Aortic valve mean velocity, S            302   cm/s     ----------  Aortic valve VTI, S                      111   cm       ----------  Aortic mean gradient, S                  43    mm Hg    ----------  Aortic peak gradient, S                  72    mm Hg    ----------  VTI ratio, LVOT/AV                       0.21           ----------  Aortic valve area, VTI  0.61  cm^2     ----------  Aortic valve area/bsa, VTI               0.31  cm^2/m^2 ----------  Velocity ratio, peak, LVOT/AV            0.22           ----------  Aortic valve area, peak velocity         0.62  cm^2     ----------  Aortic valve area/bsa, peak              0.31  cm^2/m^2 ----------  velocity  Velocity ratio, mean, LVOT/AV            0.21           ----------  Aortic valve area, mean velocity         0.59  cm^2      ----------  Aortic valve area/bsa, mean              0.29  cm^2/m^2 ----------  velocity  Aortic regurg pressure half-time         388   ms       ----------    Aorta                                    Value          Reference  Aortic root ID, ED                       32    mm       ----------    Left atrium                              Value          Reference  LA ID, A-P, ES                           38    mm       ----------  LA ID/bsa, A-P                           1.9   cm/m^2   <=2.2  LA volume, S                             56.4  ml       ----------  LA volume/bsa, S                         28.2  ml/m^2   ----------  LA volume, ES, 1-p A4C                   61.4  ml       ----------  LA volume/bsa, ES, 1-p A4C               30.7  ml/m^2   ----------  LA volume, ES, 1-p A2C                   51.8  ml       ----------  LA volume/bsa, ES, 1-p A2C  25.9  ml/m^2   ----------    Mitral valve                             Value          Reference  Mitral E-wave peak velocity              62.6  cm/s     ----------  Mitral A-wave peak velocity              116   cm/s     ----------  Mitral deceleration time         (H)     408   ms       150 - 230  Mitral E/A ratio, peak                   0.5            ----------    Pulmonary arteries                       Value          Reference  PA pressure, S, DP                       28    mm Hg    <=30    Tricuspid valve                          Value          Reference  Tricuspid regurg peak velocity           225   cm/s     ----------  Tricuspid peak RV-RA gradient            20    mm Hg    ----------    Right atrium                             Value          Reference  RA ID, S-I, ES, A4C              (H)     50.6  mm       34 - 49  RA area, ES, A4C                         12.9  cm^2     8.3 - 19.5  RA volume, ES, A/L                       28.1  ml       ----------  RA volume/bsa, ES, A/L                   14.1  ml/m^2    ----------    Systemic veins                           Value          Reference  Estimated CVP                            8     mm Hg    ----------  Right ventricle                          Value          Reference  RV ID, ED, PLAX                          23.3  mm       19 - 38  TAPSE                                    19.9  mm       ----------  RV pressure, S, DP                       28    mm Hg    <=30  RV s&', lateral, S                        10.4  cm/s     ----------  Legend: (L)  and  (H)  mark values outside specified reference range.  ------------------------------------------------------------------- Prepared and Electronically Authenticated by  Jenkins Rouge, M.D. 2018-10-08T11:48:52   Physicians   Panel Physicians Referring Physician Case Authorizing Physician  Manuel Mocha, MD (Primary)    Procedures   RIGHT/LEFT HEART CATH AND CORONARY ANGIOGRAPHY  Conclusion   1. Multivessel coronary artery disease with continued patency of the stented segments in the LAD, first diagonal branch, and mid RCA 2. Mild diffuse nonobstructive residual CAD as detailed above 3. Normal right heart hemodynamics 4. Severe aortic stenosis with a peak to peak gradient of 42 mmHg and a mean gradient of 33 mmHg  Recommendations: Continue multidisciplinary heart team evaluation for severe symptomatic aortic stenosis. This patient's echocardiogram is reviewed and he clearly meets echo criteria for severe aortic stenosis with a heavily calcified valve, dimension was index less than 0.25, peak velocity greater than 4 m/s, and mean gradient greater than 40. He has associated symptoms of exertional dyspnea. Will refer to cardiac surgery for further evaluation of treatment options including TAVR versus conventional surgical aortic valve replacement.  Indications   Severe aortic stenosis [I35.0 (ICD-10-CM)]  Procedural Details/Technique   Technical Details INDICATION: Severe symptomatic  aortic stenosis. The 71 year old gentleman with coronary artery disease who presented in March 2018 with an anterolateral STEMI. He was traveling in [place] at the time and underwent emergency cardiac catheterization and PCI. He was found to have an occluded diagonal branch which was felt to be his culprit lesion. He also was noted to have severe stenosis in the LAD and right coronary arteries. He underwent stenting of the diagonal, mid LAD, and proximal/mid RCA. He has been noted to have progressive and now severe aortic stenosis with associated exertional dyspnea. He is referred for right and left heart catheterization to evaluate coronary anatomy and further assess his aortic valve disease and hemodynamics with plans of pursuing aortic valve replacement.  PROCEDURAL DETAILS: There was an indwelling IV in a right antecubital vein. Using normal sterile technique, the IV was changed out for a 5 Fr brachial sheath over a 0.018 inch wire. The right wrist was then prepped, draped, and anesthetized with 1% lidocaine. Using the modified Seldinger technique a 5/6 French Slender sheath was placed in the right radial artery. Intra-arterial verapamil was administered through the radial  artery sheath. IV heparin was administered after a JR4 catheter was advanced into the central aorta. A Swan-Ganz catheter was used for the right heart catheterization. Standard protocol was followed for recording of right heart pressures and sampling of oxygen saturations. Fick cardiac output was calculated. Standard Judkins catheters were used for selective coronary angiography. A JR4 catheter was used to direct a J-tipped wire across the aortic valve. Pullback gradients were recorded. There were no immediate procedural complications. The patient was transferred to the post catheterization recovery area for further monitoring.         Estimated blood loss <50 mL.  During this procedure the patient was administered the following  to achieve and maintain moderate conscious sedation: Versed 2 mg, Fentanyl 25 mcg, while the patient's heart rate, blood pressure, and oxygen saturation were continuously monitored. The period of conscious sedation was 28 minutes, of which I was present face-to-face 100% of this time.  Coronary Findings   Diagnostic  Dominance: Right  Left Anterior Descending  There is mild the vessel.  Mid LAD lesion 0% stenosed  Previously placed Mid LAD drug eluting stent is widely patent.  First Diagonal Branch  1st Diag lesion 0% stenosed  Previously placed 1st Diag drug eluting stent is widely patent.  Ramus Intermedius  The intermediate branch has 50% stenosis. There is no high-grade disease in this vessel.  Ost Ramus to Ramus lesion 50% stenosed  Ost Ramus to Ramus lesion.  Left Circumflex  Vessel is angiographically normal. Left circumflex supplies 2 obtuse marginal branches with no significant stenosis.  First Obtuse Marginal Branch  Vessel is angiographically normal.  Right Coronary Artery  There is mild the vessel.  Prox RCA to Mid RCA lesion 0% stenosed  Previously placed Prox RCA to Mid RCA drug eluting stent is widely patent. There is a proximal step up into the stented segment. There is no significant stenosis present.  Mid RCA lesion 20% stenosed  The lesion was previously treated using a drug eluting stent between 6-12 months ago. The stent is patent with minimal in-stent restenosis.  Intervention   No interventions have been documented.  Left Heart   Aortic Valve There is severe aortic valve stenosis. The aortic valve is calcified. There is restricted aortic valve motion. The aortic valve is heavily calcified. The peak to peak gradient is 42 mmHg. The mean gradient is 33 mmHg. Calculated aortic valve area is 1.38 cm.  Coronary Diagrams   Diagnostic Diagram       Implants     No implant documentation for this case.  MERGE Images   Show images for CARDIAC CATHETERIZATION    Link to Procedure Log   Procedure Log    Hemo Data    Most Recent Value  Fick Cardiac Output 7.82 L/min  Fick Cardiac Output Index 3.93 (L/min)/BSA  Aortic Mean Gradient 33.1 mmHg  Aortic Peak Gradient 42 mmHg  Aortic Valve Area 1.38  Aortic Value Area Index 0.69 cm2/BSA  RA A Wave 6 mmHg  RA V Wave 4 mmHg  RA Mean 3 mmHg  RV Systolic Pressure 32 mmHg  RV Diastolic Pressure 2 mmHg  RV EDP 6 mmHg  PA Systolic Pressure 26 mmHg  PA Diastolic Pressure 9 mmHg  PA Mean 16 mmHg  PW A Wave 10 mmHg  PW V Wave 7 mmHg  PW Mean 6 mmHg  AO Systolic Pressure 578 mmHg  AO Diastolic Pressure 65 mmHg  AO Mean 94 mmHg  LV Systolic Pressure 469 mmHg  LV Diastolic Pressure 5 mmHg  LV EDP 14 mmHg  Arterial Occlusion Pressure Extended Systolic Pressure 993 mmHg  Arterial Occlusion Pressure Extended Diastolic Pressure 64 mmHg  Arterial Occlusion Pressure Extended Mean Pressure 94 mmHg  Left Ventricular Apex Extended Systolic Pressure 716 mmHg  Left Ventricular Apex Extended Diastolic Pressure 5 mmHg  Left Ventricular Apex Extended EDP Pressure 13 mmHg  QP/QS 1  TPVR Index 4.08 HRUI  TSVR Index 23.93 HRUI  PVR SVR Ratio 0.11  TPVR/TSVR Ratio 0.17    ADDENDUM REPORT: 07/21/2017 08:07  CLINICAL DATA:  Aortic stenosis  EXAM: Cardiac TAVR CT  TECHNIQUE: The patient was scanned on a Siemens 967 slice scanner. A 100 kV retrospective scan was triggered in the ascending thoracic aorta at 140 HU's. Gantry rotation speed was 250 msecs and collimation was .9 mm. No beta blockade or nitro were given. The 3D data set was reconstructed in 5% intervals of the R-R cycle. Systolic and diastolic phases were analyzed on a dedicated work station using MPR, MIP and VRT modes. The patient received 80 cc of contrast.  FINDINGS: Aortic Valve:  Tri leaflet, calcified with restricted leaflet motion  Aorta:  Moderate calcific atherosclerotic debris  Sinotubular Junction:  28 mm  Ascending  Thoracic Aorta:  32 mm  Aortic Arch:  26 mm  Descending Thoracic Aorta:  25 mm  Sinus of Valsalva Measurements:  Non-coronary:  32 mm  Right -coronary:  32 mm  Left -coronary:  32 mm  Coronary Artery Height above Annulus:  Left Main:  14.8 mm above annulus  Right Coronary:  16.3 mm above annulus  Virtual Basal Annulus Measurements:  Maximum/Minimum Diameter:  26.7 mm x 22.4 mm  Perimeter:  76.7 mm  Area:  453.7 mm2  Coronary Arteries:  Sufficient height above annulus for deployment  Optimum Fluoroscopic Angle for Delivery: LAO 3 degrees Caudal 7 degrees  IMPRESSION: 1) Calcified tri leaflet AV with annular area of 453 mm2 suitable for a 26 mm Sapien 3 valve and perimeter of 76.7 mm suitable for a 29 mm Evolut Pro valve  2) Optimum angiographic angle for deployment LAO 3 degrees Caudal 7 degrees  3) Coronary arteries sufficient height above annulus for deployment  4) Normal aortic root 3.2 cm with moderate calcific atherosclerosis  Jenkins Rouge   Electronically Signed   By: Jenkins Rouge M.D.   On: 07/21/2017 08:07   Addended by Josue Hector, MD on 07/21/2017 8:10 AM    Study Result   EXAM: OVER-READ INTERPRETATION  CT CHEST  The following report is an over-read performed by radiologist Dr. Samara Snide [facility] on 07/20/2017. This over-read does not include interpretation of cardiac or coronary anatomy or pathology. The coronary CTA interpretation by the cardiologist is attached.  COMPARISON:  06/26/2017 chest radiograph.  FINDINGS: Please see the separate report for the concurrent chest CT angiogram for further details regarding the significant extracardiac findings in the chest.  IMPRESSION: Please see the separate report for the concurrent chest CT angiogram for further details regarding the significant extracardiac findings in the chest.  Electronically Signed: By: Ilona Sorrel M.D. On:  07/20/2017 17:30       CLINICAL DATA:  Severe symptomatic aortic stenosis. Pre-TAVR evaluation.  EXAM: CT ANGIOGRAPHY CHEST, ABDOMEN AND PELVIS  TECHNIQUE: Multidetector CT imaging through the chest, abdomen and pelvis was performed using the standard protocol during bolus administration of intravenous contrast. Multiplanar reconstructed images and MIPs were obtained and reviewed to evaluate the vascular anatomy.  CONTRAST:  100 cc Isovue 370 IV.  COMPARISON:  06/26/2017 chest radiograph.  FINDINGS: CTA CHEST FINDINGS  Cardiovascular: Top-normal heart size. Thickened coarsely calcified aortic valve. No significant pericardial fluid/thickening. Left anterior descending and right coronary atherosclerosis. Atherosclerotic nonaneurysmal thoracic aorta. No evidence of acute intramural hematoma, dissection, pseudoaneurysm or penetrating atherosclerotic ulcer in the thoracic aorta. Atherosclerotic aortic arch vessels with suggestion of a high-grade left subclavian artery stenosis just proximal to the left vertebral artery takeoff (series 16/ image 45) and a moderate stenosis in the proximal left common carotid artery. Normal caliber pulmonary arteries. No central pulmonary emboli.  Mediastinum/Nodes: No discrete thyroid nodules. Unremarkable esophagus. No pathologically enlarged axillary, mediastinal or hilar lymph nodes.  Lungs/Pleura: No pneumothorax. No pleural effusion. Mild centrilobular and paraseptal emphysema with diffuse bronchial wall thickening. There is a 1.1 cm solid peripheral right lower lobe pulmonary nodule (series 15/ image 57), which may contain faint internal calcifications and appears radiographically stable back to 2009, compatible with a benign granuloma. There are clustered solid anterior right middle lobe pulmonary nodules, largest 6 mm (series 15/ image 65). No acute consolidative airspace disease, lung masses or additional significant  pulmonary nodules.  Musculoskeletal: No aggressive appearing focal osseous lesions. Marked thoracic spondylosis.  CTA ABDOMEN AND PELVIS FINDINGS  Hepatobiliary: Normal liver size. No liver masses. Normal gallbladder with no radiopaque cholelithiasis. No biliary ductal dilatation.  Pancreas: There is a poorly marginated hypodense 2.8 x 1.9 cm pancreatic neck mass (series 14/ image 113). There is associated abrupt dilatation of the main pancreatic duct up to 5 mm diameter with associated mild parenchymal atrophy in the pancreatic body and tail.  Spleen: Normal size. No mass.  Adrenals/Urinary Tract: Normal adrenals. There is a heterogeneously avidly enhancing 7.5 x 6.9 x 7.6 cm renal cortical mass in the posterior upper left kidney (series 14/ image 112), which appears contained by Zuckerkandl's fascia posteriorly, with probable early tumor extension into the left renal vein at the left renal hilum (series 14/ image 116). Additional subcentimeter hypodense renal cortical lesions in both kidneys are too small to characterize. No hydronephrosis. Normal bladder.  Stomach/Bowel: Grossly normal stomach. Normal caliber small bowel with no small bowel wall thickening. Appendectomy. Moderate sigmoid diverticulosis, with no large bowel wall thickening or pericolonic fat stranding.  Vascular/Lymphatic: Atherosclerotic abdominal aorta with 3.1 cm infrarenal abdominal aortic aneurysm. No pathologically enlarged lymph nodes in the abdomen or pelvis.  Reproductive: Mildly enlarged prostate with nonspecific internal prostatic calcifications.  Other: No pneumoperitoneum, ascites or focal fluid collection.  Musculoskeletal: No aggressive appearing focal osseous lesions. Moderate chronic appearing L2 vertebral compression fracture. Moderate lumbar spondylosis.  VASCULAR MEASUREMENTS PERTINENT TO TAVR:  AORTA:  Minimal Aortic Diameter -  17 x 16  mm (infrarenal abdominal  aorta)  Severity of Aortic Calcification -  severe  Extensive ulcerated atherosclerotic plaque throughout the abdominal aorta. Infrarenal 3.1 cm abdominal aortic aneurysm.  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 Extensive ulcerated plaque.  Right External Iliac Artery -  Minimal Diameter - 7.8 x 5.5 mm  Tortuosity - mild-to-moderate  Calcification - moderate to severe  Ulcerated plaque. Note is made of a 2.4 cm right internal iliac artery aneurysm.  Right Common Femoral Artery -  Minimal Diameter - 8.6 x 6.3 mm  Tortuosity - mild  Calcification - moderate  Ulcerated plaque.  LEFT PELVIS:  Left Common Iliac Artery -  Minimal Diameter - 8.5 x 7.5 mm  Tortuosity - mild-to-moderate  Calcification - severe  Ulcerated plaque.  Left External Iliac Artery -  Minimal Diameter - 6.2 x 5.6 mm  Tortuosity - mild  Calcification - severe  Ulcerated plaque.  Left Common Femoral Artery -  Minimal Diameter - 7.5 x 7.0 mm  Tortuosity - mild  Calcification - moderate  Ulcerated plaque.  Review of the MIP images confirms the above findings.  IMPRESSION: 1. Vascular findings and measurements pertinent to potential TAVR procedure, as detailed above. 2. Severe thickening and calcification of the aortic valve, compatible with the reported clinical history of severe aortic stenosis. 3. Poorly marginated hypodense 2.8 cm pancreatic neck mass with associated abrupt pancreatic duct dilation and parenchymal atrophy in the pancreatic body and tail. These findings are highly worrisome for primary pancreatic adenocarcinoma. GI consultation is recommended for consideration of endoscopic ultrasound with FNA. MRI abdomen without and with IV contrast may be  obtained for further characterization. 4. 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 Probable early tumor extension into the left renal vein at the left renal hilum. Urology consultation is recommended. This mass can also be further characterized on MRI abdomen without and with IV contrast. 5. No definite evidence of metastatic disease. Clustered right middle lobe solid pulmonary nodules, largest 6 mm, for which follow-up chest CT is recommended in 3 months. 6. 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 (ICD10-I71.9). Recommend follow-up aortic ultrasound in 3 years. This recommendation follows ACR consensus guidelines: White Paper of the ACR Incidental Findings Committee II on Vascular Findings. J Am Coll Radiol 2013; 10:789-794. 7. 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 8. Suggestion of a high-grade left subclavian artery stenosis just proximal to the left vertebral artery takeoff. Correlate clinically for any evidence of subclavian steal syndrome involving the left upper extremity. 9. Additional chronic findings include: Aortic Atherosclerosis (ICD10-I70.0) and Emphysema (ICD10-J43.9). Moderate sigmoid diverticulosis. Mild prostatomegaly. Moderate chronic appearing L2 vertebral compression fracture.  These results will be called to the ordering clinician or representative by the Radiologist Assistant, and communication documented in the PACS or zVision Dashboard.   Electronically Signed   By: Ilona Sorrel M.D.   On: 07/20/2017 17:28  STS Risk Calculator  Procedure: AV Replacement  Risk of Mortality: 2.572%  Morbidity or Mortality: 18.234%  Long Length of Stay: 7.637%  Short Length of Stay: 33.265%  Permanent Stroke: 1.282%  Prolonged Ventilation: 11.435%  DSW Infection: 0.378%  Renal Failure: 5.098%   Reoperation: 7.217%    Impression:  This 71 year old gentleman has stage D, severe, symptomatic aortic stenosis with New York Heart Association class IIb symptoms of exertional shortness of breath and fatigue consistent with chronic diastolic heart failure.  I have personally reviewed his echocardiogram, cardiac catheterization, and CTA studies.  His echo shows a trileaflet aortic valve with severe thickening and calcification of the leaflets with restricted mobility.  The mean transvalvular gradient is 43 mmHg consistent with severe aortic stenosis.  Cardiac catheterization shows continued patency of his previously placed stents with otherwise mild nonobstructive disease.  I agree with the need for aortic valve replacement.  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  Although he would be at low risk for open surgical aortic valve replacement I think TAVR is a much better option for him since we will allow a much quicker recovery so that he can proceed with neoadjuvant treatment for his pancreatic carcinoma and hopefully subsequent resection of his pancreatic carcinoma and left kidney.  His aortic stenosis is severe and I do not think he would tolerate surgical resection of his pancreas and left kidney without aortic valve replacement.  His gated cardiac CTA shows anatomy favorable for TAVR using a Sapien 3 valve.  His abdominal and pelvic CT shows fairly extensive ulcerated atherosclerotic plaque throughout the abdominal aorta and iliac arteries bilaterally.  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  The size of his pelvic vasculature is adequate for transfemoral insertion but it may be best to use his left side to avoid the aneurysmal disease.  The patient and his wife were counseled at length regarding treatment alternatives for management of severe symptomatic aortic stenosis. The risks and benefits of surgical intervention has been discussed in detail. Long-term prognosis with medical therapy was discussed. Alternative approaches such as conventional surgical aortic valve replacement, transcatheter aortic valve replacement, and palliative medical therapy were compared and contrasted at length. This discussion was placed in the context of the patient's own specific clinical presentation and past medical history. All of their questions have been addressed. The patient is eager to proceed with surgical management as soon as possible.   Following the decision to proceed with transcatheter aortic valve replacement, a discussion was held regarding what types of management strategies would be attempted intraoperatively in the event of life-threatening complications, including whether or not the patient would be considered a candidate for the use of cardiopulmonary bypass and/or conversion to open sternotomy for attempted surgical intervention. The patient has been advised of a variety of complications that might develop including but not limited to risks of death, stroke, paravalvular leak, aortic dissection or other major vascular complications, aortic annulus rupture, device embolization, cardiac rupture or perforation, mitral regurgitation, acute myocardial infarction, arrhythmia, heart block or bradycardia requiring permanent pacemaker placement, congestive heart failure, respiratory failure, renal failure, pneumonia, infection, other late complications related to structural valve deterioration or migration, or other complications that might ultimately cause a temporary or permanent loss of  functional independence or other long term morbidity. The patient provides full informed consent for the procedure as described and all questions were answered.     Plan:  Transfemoral TAVR on 08/22/2017   I spent 45 minutes performing this consultation and > 50% of this time was spent face to face counseling and coordinating the care of this patient's severe aortic stenosis.   Gaye Pollack, MD 08/21/2017

## 2017-08-21 NOTE — H&P (Signed)
[address]       [place] [address]             [phone]      Cardiothoracic Surgery Admission History and Physical   Referring Provider is [name], MD  PCP is [name], MD      Chief Complaint  Patient presents with  . Severe aortic stenosis       HPI:  The patient is a 71 year old gentleman with history of hypertension, hyperlipidemia, type 2 diabetes, long history of prior heavy smoking, and coronary artery disease who was traveling in [place] in March 2018 and suffered an acute ST segment elevation MI. He was taken by his wife to [facility] and underwent emergent catheterization showing 70% stenosis in the mid LAD, occlusion of a large diagonal branch, 90% proximal right coronary stenosis, and 60-70% mid right coronary stenosis. Left ventricular function was preserved. He underwent PCI with drug-eluting stents in the mid LAD, diagonal branch, and proximal and mid right coronary artery. He was told that he also had aortic stenosis and was to follow-up with a cardiologist as soon as he returned to [facility]. He reports a long history of a heart murmur which was never worked up. Upon returning he was evaluated by Dr. Domenic Polite in April and underwent an echocardiogram showing severe aortic stenosis with a mean gradient of 49 mmHg and a dimensionless index of 0.18. His ejection fraction was 55-60%. He says that he was feeling fairly well at that time and quit smoking. He saw Dr. Domenic Polite again in early October and a repeat echocardiogram showed a mean aortic valve gradient of 43 mmHg with a dimensionless index of 0.22. Left ventricular ejection fraction remains normal at 55-60%. He was referred to Dr. Burt Knack and underwent cardiac catheterization on 07/05/2017 which showed continued patency of the stented segments in his LAD, first diagonal, and right coronary artery. There was mild diffuse nonobstructive residual coronary  artery disease. The mean aortic valve gradient was measured at 33 mmHg with a peak to peak gradient of 42 mmHg. He says that over the course of the summer he developed progressive exertional shortness of breath and fatigue. This does not occur with normal walking on level ground but if he walks uphill or does any strenuous activity he notices it immediately. He has not had any chest pain or pressure. He denies any dizziness or syncope. He has had no orthopnea. His wife says that he has not been as active since he returned home from [place] and wants to rest more.  After being seen by Dr. Burt Knack he underwent workup for TAVR. His CT scan showed a mass in the head of his pancreas as well as a large left renal tumor. 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 He has been seen by Dr. Julieanne Manson as well as by 1 of the urologist. He has been evaluated by Dr. Barry Dienes and the plan is for neoadjuvant chemotherapy and possibly radiotherapy with subsequent resection of the pancreatic mass as well as left nephrectomy.  The patient is married and lives in [place] with his wife who is with him today. 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       Past Medical History:  Diagnosis Date  . Aortic stenosis   . Arthritis   . CAD (coronary artery disease)  DES to mid LAD, DES to diagonal, DES x 2 to proximal and mid RCA - March 2018, [facility] [place]  . [facility])    cancer of left kidney, mass on pancreas  . Complication of anesthesia    slow to wake up with surgery - 20 years ago   . Dyspnea    with exertion   . Essential hypertension   . GERD (gastroesophageal reflux disease)   . Heart murmur   . Hyperlipidemia   . Myocardial infarction ([place])   . 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 Type 2 diabetes mellitus ([place])         Past Surgical History:  Procedure Laterality Date  .  APPENDECTOMY    . EUS N/A 08/02/2017   Procedure: UPPER ENDOSCOPIC ULTRASOUND (EUS) RADIAL; Surgeon: [name], MD; Location: [facility] END[facility]; Service: Endoscopy; Laterality: N/A;  . EYE SURGERY Left   . HAND SURGERY Bilateral   . HERNIA REPAIR Right   . RIGHT/LEFT HEART CATH AND CORONARY ANGIOGRAPHY N/A 07/05/2017   Procedure: RIGHT/LEFT HEART CATH AND CORONARY ANGIOGRAPHY; Surgeon: [name], MD; Location: [facility]; Service: Cardiovascular; Laterality: N/A;  . SHOULDER ARTHROSCOPY WITH ROTATOR CUFF REPAIR Left         Family History  Problem Relation Age of Onset  . Lupus Mother   . CAD Brother   . Hypertension Brother    Social History        Socioeconomic History  . Marital status: Married    Spouse name: Not on file  . Number of children: Not on file  . Years of education: Not on file  . Highest education level: Not on file  Social Needs  . Financial resource strain: Not on file  . Food insecurity - worry: Not on file  . Food insecurity - inability: Not on file  . Transportation needs - medical: Not on file  . Transportation needs - non-medical: Not on file  Occupational History  . Not on file  Tobacco Use  . Smoking status: Former Smoker    Packs/day: 1.00    Years: 57.00    Pack years: 57.00    Types: Cigarettes    Start date: 09/12/1958    Last attempt to quit: 04/12/2016    Years since quitting: 1.3  . Smokeless tobacco: Never Used  Substance and Sexual Activity  . Alcohol use: No  . Drug use: No  . Sexual activity: Not on file  Other Topics Concern  . Not on file  Social History Narrative  . Not on file         Current Outpatient Medications  Medication Sig Dispense Refill  . aspirin EC 81 MG tablet Take 1 tablet (81 mg total) by mouth daily. 90 tablet 3  . atorvastatin (LIPITOR) 20 MG tablet Take 20 mg by mouth daily.    . carvedilol (COREG) 3.125 MG tablet Take 1 tablet (3.125 mg total) by mouth 2 (two) times daily with a meal. 60  tablet 6  . clopidogrel (PLAVIX) 75 MG tablet Take 1 tablet (75 mg total) by mouth daily. 90 tablet 0  . glipiZIDE (GLUCOTROL) 5 MG tablet Take 5 mg by mouth daily before breakfast.     . lisinopril (PRINIVIL,ZESTRIL) 10 MG tablet Take 1 tablet (10 mg total) by mouth daily. 30 tablet 6   No current facility-administered medications for this visit.             Facility-Administered Medications  Ordered in Other Visits  Medication Dose Route Frequency Provider Last Rate Last Dose  . [START ON 08/22/2017] 0.9 % sodium chloride infusion  Intravenous Continuous Burnell Blanks, MD    . Derrill Memo ON 08/22/2017] cefUROXime (ZINACEF) 1.5 g in dextrose 5 % 50 mL IVPB 1.5 g Intravenous To OR Gaye Pollack, MD    . Derrill Memo ON 08/22/2017] chlorhexidine (PERIDEX) 0.12 % solution 15 mL 15 mL Mouth/Throat Once Gaye Pollack, MD    . Derrill Memo ON 08/22/2017] dexmedetomidine (PRECEDEX) 400 MCG/100ML (4 mcg/mL) infusion 0.1-0.7 mcg/kg/hr Intravenous To OR Gaye Pollack, MD    . Derrill Memo ON 08/22/2017] DOPamine (INTROPIN) 800 mg in dextrose 5 % 250 mL (3.2 mg/mL) infusion 0-10 mcg/kg/min Intravenous To OR Gaye Pollack, MD    . Derrill Memo ON 08/22/2017] EPINEPHrine (ADRENALIN) 4 mg in dextrose 5 % 250 mL (0.016 mg/mL) infusion 0-10 mcg/min Intravenous To OR Gaye Pollack, MD    . Derrill Memo ON 08/22/2017] heparin 30,000 units/NS 1000 mL solution for CELLSAVER  Other To OR Gaye Pollack, MD    . Derrill Memo ON 08/22/2017] insulin regular (NOVOLIN R,HUMULIN R) 100 Units in sodium chloride 0.9 % 100 mL (1 Units/mL) infusion  Intravenous To OR Gaye Pollack, MD    . Derrill Memo ON 08/22/2017] magnesium sulfate (IV Push/IM) injection 40 mEq 40 mEq Other To OR Gaye Pollack, MD    . Derrill Memo ON 08/22/2017] nitroGLYCERIN 50 mg in dextrose 5 % 250 mL (0.2 mg/mL) infusion 2-200 mcg/min Intravenous To OR Gaye Pollack, MD    . Derrill Memo ON 08/22/2017] norepinephrine (LEVOPHED) 4 mg in dextrose 5 % 250 mL (0.016 mg/mL) infusion 0-10  mcg/min Intravenous To OR Gaye Pollack, MD    . Derrill Memo ON 08/22/2017] phenylephrine (NEO-SYNEPHRINE) 20 mg in sodium chloride 0.9 % 250 mL (0.08 mg/mL) infusion 30-200 mcg/min Intravenous To OR Gaye Pollack, MD    . Derrill Memo ON 08/22/2017] potassium chloride injection 80 mEq 80 mEq Other To OR Gaye Pollack, MD    . Derrill Memo ON 08/22/2017] vancomycin (VANCOCIN) 1,250 mg in sodium chloride 0.9 % 250 mL IVPB 1,250 mg Intravenous To OR Bartle, Fernande Boyden, MD     No Known Allergies  Review of Systems:  General: normal appetite, decreased energy, no weight gain, no weight loss, no fever  Cardiac: no chest pain with exertion, no chest pain at rest, has SOB with moderate exertion, no resting SOB, no PND, no orthopnea, no palpitations, no arrhythmia, no atrial fibrillation, no LE edema, no dizzy spells, no syncope  Respiratory: exertional shortness of breath, no home oxygen, no productive cough, has dry cough, no bronchitis, no wheezing, no hemoptysis, no asthma, no pain with inspiration or cough, no sleep apnea, no CPAP at night  GI: no difficulty swallowing, no reflux, has frequent heartburn, no hiatal hernia, no abdominal pain, no constipation, no diarrhea, no hematochezia, no hematemesis, no melena  GU: no dysuria, no frequency, no urinary tract infection, no hematuria, no enlarged prostate, no kidney stones, has kidney disease (left renal mass)  Vascular: no pain suggestive of claudication, no pain in feet, no leg cramps, no varicose veins, no DVT, no non-healing foot ulcer  Neuro: no stroke, no TIA's, no seizures, no headaches, no temporary blindness one eye, no slurred speech, no peripheral neuropathy, no chronic pain, no instability of gait, has mild memory/cognitive dysfunction  Musculoskeletal: has arthritis in hands and back, no joint swelling, no myalgias, no difficulty walking, normal mobility  Skin: no rash, no itching, no skin infections, no pressure sores or ulcerations  Psych: no anxiety,  no depression, no nervousness, no unusual recent stress  Eyes: no blurry vision, no floaters, no recent vision changes, wears glasses  ENT: has hearing loss and bilateral hearing aids, no loose or painful teeth, full dentures  Hematologic: has easy bruising, no abnormal bleeding, no clotting disorder, no frequent epistaxis  Endocrine: has diabetes, does not check CBG's at home  Physical Exam:  BP (!) 156/92  Pulse 82  Resp 18  Ht 5' 10"  (1.778 m)  Wt 175 lb (79.4 kg)  SpO2 96% Comment: on RA  BMI 25.11 kg/m  General: well-appearing  HEENT: NCAT, PERLA, EOMI, oropharynx clear  Neck: no JVD, no bruits, no adenopathy or thyromegaly  Chest: clear to auscultation, symmetrical breath sounds, no wheezes, no rhonchi  CV: RRR, grade III/VI crescendo/decrescendo murmur heard best at RSB, no diastolic murmur  Abdomen: soft, non-tender, no masses or organomegaly  Extremities: warm, well-perfused, pulses palpable in feet, no LE edema  Rectal/GU Deferred  Neuro: Grossly non-focal and symmetrical throughout  Skin: Clean and dry, no rashes, no breakdown  Diagnostic Tests:  *[facility]*  [address], [place] [address]  [phone]  -------------------------------------------------------------------  Transthoracic Echocardiography  Patient: Ashwath, Lasch  MR #: [id number]  Study Date: 06/19/2017  Gender: M  Age: 32  Height: 175.3 cm  Weight: 81 kg  BSA: 2 m^2  Pt. Status:  Room:  ATTENDING Rozann Lesches, M.D.  ORDERING Rozann Lesches, M.D.  REFERRING Rozann Lesches, M.D.  PERFORMING [facility], Forestine Na  SONOGRAPHER Alvino Chapel, RCS  cc:  -------------------------------------------------------------------  LV EF: 55% - 60%  -------------------------------------------------------------------  Indications: Aortic stenosis 424.1.  -------------------------------------------------------------------  History: PMH: Acquired from the patient and from the  patient&'s  chart. Coronary artery disease. PMH: Murmur since childhood.  Myocardial infarction. Risk factors: Hypertension. Diabetes  mellitus. Dyslipidemia.  -------------------------------------------------------------------  Study Conclusions  - Left ventricle: The cavity size was normal. There was mild  concentric hypertrophy. Systolic function was normal. The  estimated ejection fraction was in the range of 55% to 60%.  Doppler parameters are consistent with abnormal left ventricular  relaxation (grade 1 diastolic dysfunction).  - Aortic valve: There was severe stenosis. There was mild  regurgitation. Valve area (VTI): 0.61 cm^2. Valve area (Vmax):  0.62 cm^2. Valve area (Vmean): 0.59 cm^2.  - Mitral valve: Calcified annulus. Mildly thickened leaflets .  - Left atrium: The atrium was mildly dilated.  - Atrial septum: No defect or patent foramen ovale was identified.  -------------------------------------------------------------------  Study data: Comparison was made to the study of 12/30/2016. Study  status: Routine. Procedure: The patient reported no pain pre or  post test. Transthoracic echocardiography. Image quality was  adequate. Study completion: There were no complications.  Transthoracic echocardiography. M-mode, complete 2D, spectral  Doppler, and color Doppler. Birthdate: Patient birthdate:  05/18/1946. Age: Patient is 71 yr old. Sex: Gender: male.  BMI: 26.4 kg/m^2. Blood pressure: 164/78 Patient status:  Inpatient. Study date: Study date: 06/19/2017. Study time: 09:26  AM. Location: Echo laboratory.  -------------------------------------------------------------------  -------------------------------------------------------------------  Left ventricle: The cavity size was normal. There was mild  concentric hypertrophy. Systolic function was normal. The estimated  ejection fraction was in the range of 55% to 60%. Doppler  parameters are consistent with abnormal left  ventricular relaxation  (grade 1 diastolic dysfunction).  -------------------------------------------------------------------  Aortic valve: Trileaflet; severely thickened, severely calcified  leaflets.  Doppler: There was severe stenosis. There was mild  regurgitation. VTI ratio of LVOT to aortic valve: 0.21. Valve  area (VTI): 0.61 cm^2. Indexed valve area (VTI): 0.31 cm^2/m^2.  Peak velocity ratio of LVOT to aortic valve: 0.22. Valve area  (Vmax): 0.62 cm^2. Indexed valve area (Vmax): 0.31 cm^2/m^2. Mean  velocity ratio of LVOT to aortic valve: 0.21. Valve area (Vmean):  0.59 cm^2. Indexed valve area (Vmean): 0.29 cm^2/m^2. Mean  gradient (S): 43 mm Hg. Peak gradient (S): 72 mm Hg.  -------------------------------------------------------------------  Aorta: The aorta was normal, not dilated, and non-diseased.  -------------------------------------------------------------------  Mitral valve: Calcified annulus. Mildly thickened leaflets .  Doppler: There was trivial regurgitation.  -------------------------------------------------------------------  Left atrium: The atrium was mildly dilated.  -------------------------------------------------------------------  Atrial septum: No defect or patent foramen ovale was identified.  -------------------------------------------------------------------  Right ventricle: The cavity size was normal. Wall thickness was  normal. Systolic function was normal.  -------------------------------------------------------------------  Tricuspid valve: Doppler: There was mild regurgitation.  -------------------------------------------------------------------  Right atrium: The atrium was normal in size.  -------------------------------------------------------------------  Pericardium: The pericardium was normal in appearance.  -------------------------------------------------------------------  Systemic veins:  Inferior vena cava: The vessel was dilated.  The respirophasic  diameter changes were blunted (< 50%), consistent with elevated  central venous pressure.  -------------------------------------------------------------------  Post procedure conclusions  Ascending Aorta:  - The aorta was normal, not dilated, and non-diseased.  -------------------------------------------------------------------  Measurements  Left ventricle Value Reference  LV ID, ED, PLAX chordal (H) 53.1 mm 43 - 52  LV ID, ES, PLAX chordal (H) 38.1 mm 23 - 38  LV fx shortening, PLAX chordal (L) 28 % >=29  LV PW thickness, ED 14.1 mm ----------  IVS/LV PW ratio, ED 0.91 <=1.3  Stroke volume, 2D 67 ml ----------  Stroke volume/bsa, 2D 34 ml/m^2 ----------  LV ejection fraction, 1-p A4C 53 % ----------  LV end-diastolic volume, 2-p 94 ml ----------  LV end-systolic volume, 2-p 45 ml ----------  LV ejection fraction, 2-p 52 % ----------  Stroke volume, 2-p 49 ml ----------  LV end-diastolic volume/bsa, 2-p 47 ml/m^2 ----------  LV end-systolic volume/bsa, 2-p 22 ml/m^2 ----------  Stroke volume/bsa, 2-p 24.6 ml/m^2 ----------  LV e&', lateral 4.35 cm/s ----------  LV E/e&', lateral 14.39 ----------  LV e&', medial 3.48 cm/s ----------  LV E/e&', medial 17.99 ----------  LV e&', average 3.92 cm/s ----------  LV E/e&', average 15.99 ----------  Ventricular septum Value Reference  IVS thickness, ED 12.8 mm ----------  LVOT Value Reference  LVOT ID, S 19 mm ----------  LVOT area 2.84 cm^2 ----------  LVOT peak velocity, S 93.3 cm/s ----------  LVOT mean velocity, S 62.6 cm/s ----------  LVOT VTI, S 23.7 cm ----------  LVOT peak gradient, S 3 mm Hg ----------  Aortic valve Value Reference  Aortic valve peak velocity, S 425 cm/s ----------  Aortic valve mean velocity, S 302 cm/s ----------  Aortic valve VTI, S 111 cm ----------  Aortic mean gradient, S 43 mm Hg ----------  Aortic peak gradient, S 72 mm Hg ----------  VTI ratio, LVOT/AV 0.21 ----------  Aortic  valve area, VTI 0.61 cm^2 ----------  Aortic valve area/bsa, VTI 0.31 cm^2/m^2 ----------  Velocity ratio, peak, LVOT/AV 0.22 ----------  Aortic valve area, peak velocity 0.62 cm^2 ----------  Aortic valve area/bsa, peak 0.31 cm^2/m^2 ----------  velocity  Velocity ratio, mean, LVOT/AV 0.21 ----------  Aortic valve area, mean velocity 0.59 cm^2 ----------  Aortic valve area/bsa, mean 0.29 cm^2/m^2 ----------  velocity  Aortic regurg  pressure half-time 388 ms ----------  Aorta Value Reference  Aortic root ID, ED 32 mm ----------  Left atrium Value Reference  LA ID, A-P, ES 38 mm ----------  LA ID/bsa, A-P 1.9 cm/m^2 <=2.2  LA volume, S 56.4 ml ----------  LA volume/bsa, S 28.2 ml/m^2 ----------  LA volume, ES, 1-p A4C 61.4 ml ----------  LA volume/bsa, ES, 1-p A4C 30.7 ml/m^2 ----------  LA volume, ES, 1-p A2C 51.8 ml ----------  LA volume/bsa, ES, 1-p A2C 25.9 ml/m^2 ----------  Mitral valve Value Reference  Mitral E-wave peak velocity 62.6 cm/s ----------  Mitral A-wave peak velocity 116 cm/s ----------  Mitral deceleration time (H) 408 ms 150 - 230  Mitral E/A ratio, peak 0.5 ----------  Pulmonary arteries Value Reference  PA pressure, S, DP 28 mm Hg <=30  Tricuspid valve Value Reference  Tricuspid regurg peak velocity 225 cm/s ----------  Tricuspid peak RV-RA gradient 20 mm Hg ----------  Right atrium Value Reference  RA ID, S-I, ES, A4C (H) 50.6 mm 34 - 49  RA area, ES, A4C 12.9 cm^2 8.3 - 19.5  RA volume, ES, A/L 28.1 ml ----------  RA volume/bsa, ES, A/L 14.1 ml/m^2 ----------  Systemic veins Value Reference  Estimated CVP 8 mm Hg ----------  Right ventricle Value Reference  RV ID, ED, PLAX 23.3 mm 19 - 38  TAPSE 19.9 mm ----------  RV pressure, S, DP 28 mm Hg <=30  RV s&', lateral, S 10.4 cm/s ----------  Legend:  (L) and (H) mark values outside specified reference range.  -------------------------------------------------------------------  Prepared and  Electronically Authenticated by  Jenkins Rouge, M.D.  2018-10-08T11:48:52  Physicians  Panel Physicians Referring Physician Case Authorizing Physician  [name], MD (Primary)    Procedures  RIGHT/LEFT HEART CATH AND CORONARY ANGIOGRAPHY  Conclusion  1. Multivessel coronary artery disease with continued patency of the stented segments in the LAD, first diagonal branch, and mid RCA  2. Mild diffuse nonobstructive residual CAD as detailed above  3. Normal right heart hemodynamics  4. Severe aortic stenosis with a peak to peak gradient of 42 mmHg and a mean gradient of 33 mmHg  Recommendations: Continue multidisciplinary heart team evaluation for severe symptomatic aortic stenosis. This patient's echocardiogram is reviewed and he clearly meets echo criteria for severe aortic stenosis with a heavily calcified valve, dimension was index less than 0.25, peak velocity greater than 4 m/s, and mean gradient greater than 40. He has associated symptoms of exertional dyspnea. Will refer to cardiac surgery for further evaluation of treatment options including TAVR versus conventional surgical aortic valve replacement.  Indications  Severe aortic stenosis [I35.0 (ICD-10-CM)]  Procedural Details/Technique  Technical Details INDICATION: Severe symptomatic aortic stenosis. The 71 year old gentleman with coronary artery disease who presented in March 2018 with an anterolateral STEMI. He was traveling in [place] at the time and underwent emergency cardiac catheterization and PCI. He was found to have an occluded diagonal branch which was felt to be his culprit lesion. He also was noted to have severe stenosis in the LAD and right coronary arteries. He underwent stenting of the diagonal, mid LAD, and proximal/mid RCA. He has been noted to have progressive and now severe aortic stenosis with associated exertional dyspnea. He is referred for right and left heart catheterization to evaluate coronary anatomy and  further assess his aortic valve disease and hemodynamics with plans of pursuing aortic valve replacement.  PROCEDURAL DETAILS: There was an indwelling IV in a right antecubital vein. Using normal sterile technique, the  IV was changed out for a 5 Fr brachial sheath over a 0.018 inch wire. The right wrist was then prepped, draped, and anesthetized with 1% lidocaine. Using the modified Seldinger technique a 5/6 French Slender sheath was placed in the right radial artery. Intra-arterial verapamil was administered through the radial artery sheath. IV heparin was administered after a JR4 catheter was advanced into the central aorta. A Swan-Ganz catheter was used for the right heart catheterization. Standard protocol was followed for recording of right heart pressures and sampling of oxygen saturations. Fick cardiac output was calculated. Standard Judkins catheters were used for selective coronary angiography. A JR4 catheter was used to direct a J-tipped wire across the aortic valve. Pullback gradients were recorded. There were no immediate procedural complications. The patient was transferred to the post catheterization recovery area for further monitoring.         Estimated blood loss <50 mL.  During this procedure the patient was administered the following to achieve and maintain moderate conscious sedation: Versed 2 mg, Fentanyl 25 mcg, while the patient's heart rate, blood pressure, and oxygen saturation were continuously monitored. The period of conscious sedation was 28 minutes, of which I was present face-to-face 100% of this time.  Coronary Findings  Diagnostic  Dominance: Right  Left Anterior Descending  There is mild the vessel.  Mid LAD lesion 0% stenosed  Previously placed Mid LAD drug eluting stent is widely patent.  First Diagonal Branch  1st Diag lesion 0% stenosed  Previously placed 1st Diag drug eluting stent is widely patent.  Ramus Intermedius  The intermediate branch has 50%  stenosis. There is no high-grade disease in this vessel.  Ost Ramus to Ramus lesion 50% stenosed  Ost Ramus to Ramus lesion.  Left Circumflex  Vessel is angiographically normal. Left circumflex supplies 2 obtuse marginal branches with no significant stenosis.  First Obtuse Marginal Branch  Vessel is angiographically normal.  Right Coronary Artery  There is mild the vessel.  Prox RCA to Mid RCA lesion 0% stenosed  Previously placed Prox RCA to Mid RCA drug eluting stent is widely patent. There is a proximal step up into the stented segment. There is no significant stenosis present.  Mid RCA lesion 20% stenosed  The lesion was previously treated using a drug eluting stent between 6-12 months ago. The stent is patent with minimal in-stent restenosis.  Intervention  No interventions have been documented.  Left Heart  Aortic Valve There is severe aortic valve stenosis. The aortic valve is calcified. There is restricted aortic valve motion. The aortic valve is heavily calcified. The peak to peak gradient is 42 mmHg. The mean gradient is 33 mmHg. Calculated aortic valve area is 1.38 cm.  Coronary Diagrams  Diagnostic Diagram     Implants     No implant documentation for this case.  MERGE Images  Link to Procedure Log   Show images for CARDIAC CATHETERIZATION Procedure Log  Hemo Data   Most Recent Value  Fick Cardiac Output 7.82 L/min  Fick Cardiac Output Index 3.93 (L/min)/BSA  Aortic Mean Gradient 33.1 mmHg  Aortic Peak Gradient 42 mmHg  Aortic Valve Area 1.38  Aortic Value Area Index 0.69 cm2/BSA  RA A Wave 6 mmHg  RA V Wave 4 mmHg  RA Mean 3 mmHg  RV Systolic Pressure 32 mmHg  RV Diastolic Pressure 2 mmHg  RV EDP 6 mmHg  PA Systolic Pressure 26 mmHg  PA Diastolic Pressure 9 mmHg  PA Mean 16 mmHg  PW  A Wave 10 mmHg  PW V Wave 7 mmHg  PW Mean 6 mmHg  AO Systolic Pressure 213 mmHg  AO Diastolic Pressure 65 mmHg  AO Mean 94 mmHg  LV Systolic Pressure 086 mmHg  LV Diastolic  Pressure 5 mmHg  LV EDP 14 mmHg  Arterial Occlusion Pressure Extended Systolic Pressure 578 mmHg  Arterial Occlusion Pressure Extended Diastolic Pressure 64 mmHg  Arterial Occlusion Pressure Extended Mean Pressure 94 mmHg  Left Ventricular Apex Extended Systolic Pressure 469 mmHg  Left Ventricular Apex Extended Diastolic Pressure 5 mmHg  Left Ventricular Apex Extended EDP Pressure 13 mmHg  QP/QS 1  TPVR Index 4.08 HRUI  TSVR Index 23.93 HRUI  PVR SVR Ratio 0.11  TPVR/TSVR Ratio 0.17   ADDENDUM REPORT: 07/21/2017 08:07  CLINICAL DATA: Aortic stenosis  EXAM:  Cardiac TAVR CT  TECHNIQUE:  The patient was scanned on a Siemens 629 slice scanner. A 100 kV  retrospective scan was triggered in the ascending thoracic aorta at  140 HU's. Gantry rotation speed was 250 msecs and collimation was .9  mm. No beta blockade or nitro were given. The 3D data set was  reconstructed in 5% intervals of the R-R cycle. Systolic and  diastolic phases were analyzed on a dedicated work station using  MPR, MIP and VRT modes. The patient received 80 cc of contrast.  FINDINGS:  Aortic Valve: Tri leaflet, calcified with restricted leaflet motion  Aorta: Moderate calcific atherosclerotic debris  Sinotubular Junction: 28 mm  Ascending Thoracic Aorta: 32 mm  Aortic Arch: 26 mm  Descending Thoracic Aorta: 25 mm  Sinus of Valsalva Measurements:  Non-coronary: 32 mm  Right -coronary: 32 mm  Left -coronary: 32 mm  Coronary Artery Height above Annulus:  Left Main: 14.8 mm above annulus  Right Coronary: 16.3 mm above annulus  Virtual Basal Annulus Measurements:  Maximum/Minimum Diameter: 26.7 mm x 22.4 mm  Perimeter: 76.7 mm  Area: 453.7 mm2  Coronary Arteries: Sufficient height above annulus for deployment  Optimum Fluoroscopic Angle for Delivery: LAO 3 degrees Caudal 7  degrees  IMPRESSION:  1) Calcified tri leaflet AV with annular area of 453 mm2 suitable  for a 26 mm Sapien 3 valve and perimeter of 76.7  mm suitable for a  29 mm Evolut Pro valve  2) Optimum angiographic angle for deployment LAO 3 degrees Caudal 7  degrees  3) Coronary arteries sufficient height above annulus for deployment  4) Normal aortic root 3.2 cm with moderate calcific atherosclerosis  Jenkins Rouge  Electronically Signed  By: Jenkins Rouge M.D.  On: 07/21/2017 08:07   Addended by Josue Hector, MD on 07/21/2017 8:10 AM  Study Result   EXAM:  OVER-READ INTERPRETATION CT CHEST  The following report is an over-read performed by radiologist Dr.  Samara Snide [facility] on 07/20/2017. This over-read  does not include interpretation of cardiac or coronary anatomy or  pathology. The coronary CTA interpretation by the cardiologist is  attached.  COMPARISON: 06/26/2017 chest radiograph.  FINDINGS:  Please see the separate report for the concurrent chest CT angiogram  for further details regarding the significant extracardiac findings  in the chest.  IMPRESSION:  Please see the separate report for the concurrent chest CT angiogram  for further details regarding the significant extracardiac findings  in the chest.  Electronically Signed:  By: Ilona Sorrel M.D.  On: 07/20/2017 17:30   CLINICAL DATA: Severe symptomatic aortic stenosis. Pre-TAVR  evaluation.  EXAM:  CT ANGIOGRAPHY CHEST, ABDOMEN AND  PELVIS  TECHNIQUE:  Multidetector CT imaging through the chest, abdomen and pelvis was  performed using the standard protocol during bolus administration of  intravenous contrast. Multiplanar reconstructed images and MIPs were  obtained and reviewed to evaluate the vascular anatomy.  CONTRAST: 100 cc Isovue 370 IV.  COMPARISON: 06/26/2017 chest radiograph.  FINDINGS:  CTA CHEST FINDINGS  Cardiovascular: Top-normal heart size. Thickened coarsely calcified  aortic valve. No significant pericardial fluid/thickening. Left  anterior descending and right coronary atherosclerosis.  Atherosclerotic  nonaneurysmal thoracic aorta. No evidence of acute  intramural hematoma, dissection, pseudoaneurysm or penetrating  atherosclerotic ulcer in the thoracic aorta. Atherosclerotic aortic  arch vessels with suggestion of a high-grade left subclavian artery  stenosis just proximal to the left vertebral artery takeoff (series  16/ image 45) and a moderate stenosis in the proximal left common  carotid artery. Normal caliber pulmonary arteries. No central  pulmonary emboli.  Mediastinum/Nodes: No discrete thyroid nodules. Unremarkable  esophagus. No pathologically enlarged axillary, mediastinal or hilar  lymph nodes.  Lungs/Pleura: No pneumothorax. No pleural effusion. Mild  centrilobular and paraseptal emphysema with diffuse bronchial wall  thickening. There is a 1.1 cm solid peripheral right lower lobe  pulmonary nodule (series 15/ image 57), which may contain faint  internal calcifications and appears radiographically stable back to  2009, compatible with a benign granuloma. There are clustered solid  anterior right middle lobe pulmonary nodules, largest 6 mm (series  15/ image 65). No acute consolidative airspace disease, lung masses  or additional significant pulmonary nodules.  Musculoskeletal: No aggressive appearing focal osseous lesions.  Marked thoracic spondylosis.  CTA ABDOMEN AND PELVIS FINDINGS  Hepatobiliary: Normal liver size. No liver masses. Normal  gallbladder with no radiopaque cholelithiasis. No biliary ductal  dilatation.  Pancreas: There is a poorly marginated hypodense 2.8 x 1.9 cm  pancreatic neck mass (series 14/ image 113). There is associated  abrupt dilatation of the main pancreatic duct up to 5 mm diameter  with associated mild parenchymal atrophy in the pancreatic body and  tail.  Spleen: Normal size. No mass.  Adrenals/Urinary Tract: Normal adrenals. There is a heterogeneously  avidly enhancing 7.5 x 6.9 x 7.6 cm renal cortical mass in the  posterior upper  left kidney (series 14/ image 112), which appears  contained by Zuckerkandl's fascia posteriorly, with probable early  tumor extension into the left renal vein at the left renal hilum  (series 14/ image 116). Additional subcentimeter hypodense renal  cortical lesions in both kidneys are too small to characterize. No  hydronephrosis. Normal bladder.  Stomach/Bowel: Grossly normal stomach. Normal caliber small bowel  with no small bowel wall thickening. Appendectomy. Moderate sigmoid  diverticulosis, with no large bowel wall thickening or pericolonic  fat stranding.  Vascular/Lymphatic: Atherosclerotic abdominal aorta with 3.1 cm  infrarenal abdominal aortic aneurysm. No pathologically enlarged  lymph nodes in the abdomen or pelvis.  Reproductive: Mildly enlarged prostate with nonspecific internal  prostatic calcifications.  Other: No pneumoperitoneum, ascites or focal fluid collection.  Musculoskeletal: No aggressive appearing focal osseous lesions.  Moderate chronic appearing L2 vertebral compression fracture.  Moderate lumbar spondylosis.  VASCULAR MEASUREMENTS PERTINENT TO TAVR:  AORTA:  Minimal Aortic Diameter - 17 x 16 mm (infrarenal abdominal aorta)  Severity of Aortic Calcification - severe  Extensive ulcerated atherosclerotic plaque throughout the abdominal  aorta. Infrarenal 3.1 cm abdominal aortic aneurysm.  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 Extensive ulcerated plaque.  Right External Iliac Artery -  Minimal Diameter - 7.8 x 5.5 mm  Tortuosity - mild-to-moderate  Calcification - moderate to severe  Ulcerated plaque. Note is made of a 2.4 cm right internal iliac  artery aneurysm.  Right Common Femoral Artery -  Minimal Diameter - 8.6 x 6.3 mm  Tortuosity - mild  Calcification - moderate    Ulcerated plaque.  LEFT PELVIS:  Left Common Iliac Artery -  Minimal Diameter - 8.5 x 7.5 mm  Tortuosity - mild-to-moderate  Calcification - severe  Ulcerated plaque.  Left External Iliac Artery -  Minimal Diameter - 6.2 x 5.6 mm  Tortuosity - mild  Calcification - severe  Ulcerated plaque.  Left Common Femoral Artery -  Minimal Diameter - 7.5 x 7.0 mm  Tortuosity - mild  Calcification - moderate  Ulcerated plaque.  Review of the MIP images confirms the above findings.  IMPRESSION:  1. Vascular findings and measurements pertinent to potential TAVR  procedure, as detailed above.  2. Severe thickening and calcification of the aortic valve,  compatible with the reported clinical history of severe aortic  stenosis.  3. Poorly marginated hypodense 2.8 cm pancreatic neck mass with  associated abrupt pancreatic duct dilation and parenchymal atrophy  in the pancreatic body and tail. These findings are highly worrisome  for primary pancreatic adenocarcinoma. GI consultation is  recommended for consideration of endoscopic ultrasound with FNA. MRI  abdomen without and with IV contrast may be obtained for further  characterization.  4. 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 Probable early tumor extension into the left renal  vein at the left renal hilum. Urology consultation is recommended.  This mass can also be further characterized on MRI abdomen without  and with IV contrast.  5. No definite evidence of metastatic disease. Clustered right  middle lobe solid pulmonary nodules, largest 6 mm, for which  follow-up chest CT is recommended in 3 months.  6. 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 (ICD10-I71.9).  Recommend follow-up aortic ultrasound in 3 years. This  recommendation follows ACR consensus guidelines: White Paper of the  ACR Incidental Findings Committee  II on Vascular Findings. J Am Coll  Radiol 2013; 10:789-794.  7. 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  8. Suggestion of a high-grade left subclavian artery stenosis just  proximal to the left vertebral artery takeoff. Correlate clinically  for any evidence of subclavian steal syndrome involving the left  upper extremity.  9. Additional chronic findings include: Aortic Atherosclerosis  (ICD10-I70.0) and Emphysema (ICD10-J43.9). Moderate sigmoid  diverticulosis. Mild prostatomegaly. Moderate chronic appearing L2  vertebral compression fracture.  These results will be called to the ordering clinician or  representative by the Radiologist Assistant, and communication  documented in the PACS or zVision Dashboard.  Electronically Signed  By: Ilona Sorrel M.D.  On: 07/20/2017 17:28    STS Risk Calculator  Procedure: AV Replacement  Risk of Mortality: 2.572%  Morbidity or Mortality: 18.234%  Long Length of Stay: 7.637%  Short Length of Stay: 33.265%  Permanent Stroke: 1.282%  Prolonged Ventilation: 11.435%  DSW Infection: 0.378%  Renal Failure: 5.098%  Reoperation: 7.217%   Impression:   This 71 year old gentleman has stage D, severe, symptomatic aortic  stenosis with New York Heart Association class IIb symptoms of exertional shortness of breath and fatigue consistent with chronic diastolic heart failure. I have personally reviewed his echocardiogram, cardiac catheterization, and CTA studies. His echo shows a trileaflet aortic valve with severe thickening and calcification of the leaflets with restricted mobility. The mean transvalvular gradient is 43 mmHg consistent with severe aortic stenosis. Cardiac catheterization shows continued patency of his previously placed stents with otherwise mild nonobstructive disease. I agree with the need for aortic valve replacement. Unfortunately his CT scans also revealed a mass in the head of the  pancreas which is adenocarcinoma as well as a large left renal tumor suspicious for renal cell carcinoma. 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 Although he would be at low risk for open surgical aortic valve replacement I think TAVR is a much better option for him since we will allow a much quicker recovery so that he can proceed with neoadjuvant treatment for his pancreatic carcinoma and hopefully subsequent resection of his pancreatic carcinoma and left kidney. His aortic stenosis is severe and I do not think he would tolerate surgical resection of his pancreas and left kidney without aortic valve replacement. His gated cardiac CTA shows anatomy favorable for TAVR using a Sapien 3 valve. His abdominal and pelvic CT shows fairly extensive ulcerated atherosclerotic plaque throughout the abdominal aorta and iliac arteries bilaterally. 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 The size of his pelvic vasculature is adequate for transfemoral insertion but it may be best to use his left side to avoid the aneurysmal disease.  The patient and his wife were counseled at length regarding treatment alternatives for management of severe symptomatic aortic stenosis. The risks and benefits of surgical intervention has been discussed in detail. Long-term prognosis with medical therapy was discussed. Alternative approaches such as conventional surgical aortic valve replacement, transcatheter aortic valve replacement, and palliative medical therapy were compared and contrasted at length. This discussion was placed in the context of the patient's own specific clinical presentation and past medical history. All of their questions have been addressed. The patient is eager to proceed with  surgical management as soon as possible.  Following the decision to proceed with transcatheter aortic valve replacement, a discussion was held regarding what types of management strategies would be attempted intraoperatively in the event of life-threatening complications, including whether or not the patient would be considered a candidate for the use of cardiopulmonary bypass and/or conversion to open sternotomy for attempted surgical intervention. The patient has been advised of a variety of complications that might develop including but not limited to risks of death, stroke, paravalvular leak, aortic dissection or other major vascular complications, aortic annulus rupture, device embolization, cardiac rupture or perforation, mitral regurgitation, acute myocardial infarction, arrhythmia, heart block or bradycardia requiring permanent pacemaker placement, congestive heart failure, respiratory failure, renal failure, pneumonia, infection, other late complications related to structural valve deterioration or migration, or other complications that might ultimately cause a temporary or permanent loss of functional independence or other long term morbidity. The patient provides full informed consent for the procedure as described and all questions were answered.   Plan:   Transfemoral TAVR on 08/22/2017    Gaye Pollack, MD

## 2017-08-21 NOTE — Progress Notes (Signed)
Surgery Time change, message left on answering machine for new arrival time of 10:30am.

## 2017-08-22 ENCOUNTER — Inpatient Hospital Stay (HOSPITAL_COMMUNITY): Payer: Medicare Other

## 2017-08-22 ENCOUNTER — Encounter (HOSPITAL_COMMUNITY): Payer: Self-pay | Admitting: Certified Registered Nurse Anesthetist

## 2017-08-22 ENCOUNTER — Inpatient Hospital Stay (HOSPITAL_COMMUNITY): Payer: Medicare Other | Admitting: Emergency Medicine

## 2017-08-22 ENCOUNTER — Inpatient Hospital Stay (HOSPITAL_COMMUNITY)
Admission: RE | Admit: 2017-08-22 | Discharge: 2017-08-24 | DRG: 267 | Disposition: A | Discharge status: Home Health | Payer: Medicare Other | Source: Ambulatory Visit | Attending: Surgery | Admitting: Surgery

## 2017-08-22 ENCOUNTER — Encounter (HOSPITAL_COMMUNITY)
Admission: RE | Disposition: A | Discharge status: Home Health | Payer: Self-pay | Source: Ambulatory Visit | Attending: Surgery

## 2017-08-22 ENCOUNTER — Other Ambulatory Visit: Payer: Self-pay

## 2017-08-22 DIAGNOSIS — Z006 Encounter for examination for normal comparison and control in clinical research program: Secondary | ICD-10-CM | POA: Diagnosis not present

## 2017-08-22 DIAGNOSIS — E119 Type 2 diabetes mellitus without complications: Secondary | ICD-10-CM | POA: Diagnosis not present

## 2017-08-22 DIAGNOSIS — Z7982 Long term (current) use of aspirin: Secondary | ICD-10-CM | POA: Diagnosis not present

## 2017-08-22 DIAGNOSIS — K8689 Other specified diseases of pancreas: Secondary | ICD-10-CM | POA: Diagnosis present

## 2017-08-22 DIAGNOSIS — Z87891 Personal history of nicotine dependence: Secondary | ICD-10-CM

## 2017-08-22 DIAGNOSIS — I252 Old myocardial infarction: Secondary | ICD-10-CM

## 2017-08-22 DIAGNOSIS — Z952 Presence of prosthetic heart valve: Secondary | ICD-10-CM

## 2017-08-22 DIAGNOSIS — I10 Essential (primary) hypertension: Secondary | ICD-10-CM | POA: Diagnosis not present

## 2017-08-22 DIAGNOSIS — Z7984 Long term (current) use of oral hypoglycemic drugs: Secondary | ICD-10-CM

## 2017-08-22 DIAGNOSIS — E785 Hyperlipidemia, unspecified: Secondary | ICD-10-CM | POA: Diagnosis not present

## 2017-08-22 DIAGNOSIS — I2510 Atherosclerotic heart disease of native coronary artery without angina pectoris: Secondary | ICD-10-CM | POA: Diagnosis present

## 2017-08-22 DIAGNOSIS — Z905 Acquired absence of kidney: Secondary | ICD-10-CM

## 2017-08-22 DIAGNOSIS — Z8249 Family history of ischemic heart disease and other diseases of the circulatory system: Secondary | ICD-10-CM | POA: Diagnosis not present

## 2017-08-22 DIAGNOSIS — I350 Nonrheumatic aortic (valve) stenosis: Secondary | ICD-10-CM

## 2017-08-22 DIAGNOSIS — Z79899 Other long term (current) drug therapy: Secondary | ICD-10-CM | POA: Diagnosis not present

## 2017-08-22 DIAGNOSIS — Z8673 Personal history of transient ischemic attack (TIA), and cerebral infarction without residual deficits: Secondary | ICD-10-CM

## 2017-08-22 DIAGNOSIS — Z955 Presence of coronary angioplasty implant and graft: Secondary | ICD-10-CM | POA: Diagnosis not present

## 2017-08-22 DIAGNOSIS — C250 Malignant neoplasm of head of pancreas: Secondary | ICD-10-CM | POA: Diagnosis present

## 2017-08-22 DIAGNOSIS — K219 Gastro-esophageal reflux disease without esophagitis: Secondary | ICD-10-CM | POA: Diagnosis present

## 2017-08-22 DIAGNOSIS — C642 Malignant neoplasm of left kidney, except renal pelvis: Secondary | ICD-10-CM | POA: Diagnosis not present

## 2017-08-22 DIAGNOSIS — N2889 Other specified disorders of kidney and ureter: Secondary | ICD-10-CM | POA: Diagnosis present

## 2017-08-22 DIAGNOSIS — Z7902 Long term (current) use of antithrombotics/antiplatelets: Secondary | ICD-10-CM

## 2017-08-22 DIAGNOSIS — C259 Malignant neoplasm of pancreas, unspecified: Secondary | ICD-10-CM | POA: Diagnosis present

## 2017-08-22 HISTORY — DX: Malignant neoplasm of pancreas, unspecified: C25.9

## 2017-08-22 HISTORY — PX: TEE WITHOUT CARDIOVERSION: SHX5443

## 2017-08-22 HISTORY — DX: Nonrheumatic aortic (valve) stenosis: I35.0

## 2017-08-22 HISTORY — DX: Personal history of nicotine dependence: Z87.891

## 2017-08-22 HISTORY — PX: TRANSCATHETER AORTIC VALVE REPLACEMENT, TRANSFEMORAL: SHX6400

## 2017-08-22 HISTORY — DX: Presence of prosthetic heart valve: Z95.2

## 2017-08-22 HISTORY — DX: Other specified disorders of kidney and ureter: N28.89

## 2017-08-22 LAB — POCT I-STAT, CHEM 8
BUN: 19 mg/dL (ref 6–20)
BUN: 21 mg/dL — ABNORMAL HIGH (ref 6–20)
BUN: 22 mg/dL — ABNORMAL HIGH (ref 6–20)
Calcium, Ion: 1.32 mmol/L (ref 1.15–1.40)
Calcium, Ion: 1.23 mmol/L (ref 1.15–1.40)
Calcium, Ion: 1.28 mmol/L (ref 1.15–1.40)
Chloride: 104 mmol/L (ref 101–111)
Chloride: 104 mmol/L (ref 101–111)
Chloride: 104 mmol/L (ref 101–111)
Creatinine, Ser: 0.8 mg/dL (ref 0.61–1.24)
Creatinine, Ser: 0.7 mg/dL (ref 0.61–1.24)
Creatinine, Ser: 0.8 mg/dL (ref 0.61–1.24)
Glucose, Bld: 177 mg/dL — ABNORMAL HIGH (ref 65–99)
Glucose, Bld: 198 mg/dL — ABNORMAL HIGH (ref 65–99)
Glucose, Bld: 201 mg/dL — ABNORMAL HIGH (ref 65–99)
HCT: 37 % — ABNORMAL LOW (ref 39.0–52.0)
HCT: 33 % — ABNORMAL LOW (ref 39.0–52.0)
HCT: 34 % — ABNORMAL LOW (ref 39.0–52.0)
Hemoglobin: 12.6 g/dL — ABNORMAL LOW (ref 13.0–17.0)
Hemoglobin: 11.2 g/dL — ABNORMAL LOW (ref 13.0–17.0)
Hemoglobin: 11.6 g/dL — ABNORMAL LOW (ref 13.0–17.0)
Potassium: 4.1 mmol/L (ref 3.5–5.1)
Potassium: 4.1 mmol/L (ref 3.5–5.1)
Potassium: 4.3 mmol/L (ref 3.5–5.1)
Sodium: 140 mmol/L (ref 135–145)
Sodium: 139 mmol/L (ref 135–145)
Sodium: 140 mmol/L (ref 135–145)
TCO2: 22 mmol/L (ref 22–32)
TCO2: 24 mmol/L (ref 22–32)
TCO2: 26 mmol/L (ref 22–32)

## 2017-08-22 LAB — GLUCOSE, CAPILLARY
Glucose-Capillary: 251 mg/dL — ABNORMAL HIGH (ref 65–99)
Glucose-Capillary: 202 mg/dL — ABNORMAL HIGH (ref 65–99)

## 2017-08-22 LAB — CBC
HCT: 35.5 % — ABNORMAL LOW (ref 39.0–52.0)
Hemoglobin: 11.6 g/dL — ABNORMAL LOW (ref 13.0–17.0)
MCH: 29.7 pg (ref 26.0–34.0)
MCHC: 32.7 g/dL (ref 30.0–36.0)
MCV: 91 fL (ref 78.0–100.0)
Platelets: 117 10*3/uL — ABNORMAL LOW (ref 150–400)
RBC: 3.9 MIL/uL — ABNORMAL LOW (ref 4.22–5.81)
RDW: 13.7 % (ref 11.5–15.5)
WBC: 9.4 10*3/uL (ref 4.0–10.5)

## 2017-08-22 SURGERY — IMPLANTATION, AORTIC VALVE, TRANSCATHETER, FEMORAL APPROACH
Anesthesia: Monitor Anesthesia Care | Site: Chest

## 2017-08-22 MED ORDER — VANCOMYCIN HCL IN DEXTROSE 1-5 GM/200ML-% IV SOLN
1000.0000 mg | Freq: Once | INTRAVENOUS | Status: AC
  Administered 2017-08-23: 1000 mg via INTRAVENOUS
  Filled 2017-08-22: qty 200

## 2017-08-22 MED ORDER — CHLORHEXIDINE GLUCONATE 0.12 % MT SOLN
15.0000 mL | OROMUCOSAL | Status: AC
  Administered 2017-08-22: 15 mL via OROMUCOSAL
  Filled 2017-08-22: qty 15

## 2017-08-22 MED ORDER — ACETAMINOPHEN 650 MG RE SUPP: 650.0000 mg | Freq: Once | RECTAL | Status: DC

## 2017-08-22 MED ORDER — ACETAMINOPHEN 500 MG PO TABS
1000.0000 mg | ORAL_TABLET | Freq: Four times a day (QID) | ORAL | Status: DC
  Administered 2017-08-22 – 2017-08-23 (×3): 1000 mg via ORAL
  Filled 2017-08-22 (×3): qty 2

## 2017-08-22 MED ORDER — CARVEDILOL 3.125 MG PO TABS
3.1250 mg | ORAL_TABLET | Freq: Two times a day (BID) | ORAL | Status: DC
  Administered 2017-08-23 – 2017-08-24 (×3): 3.125 mg via ORAL
  Filled 2017-08-22 (×3): qty 1

## 2017-08-22 MED ORDER — ONDANSETRON HCL 4 MG/2ML IJ SOLN
INTRAMUSCULAR | Status: DC | PRN
  Administered 2017-08-22: 4 mg via INTRAVENOUS

## 2017-08-22 MED ORDER — DEXTROSE 5 % IV SOLN
1.5000 g | Freq: Two times a day (BID) | INTRAVENOUS | Status: DC
  Administered 2017-08-23 – 2017-08-24 (×3): 1.5 g via INTRAVENOUS
  Filled 2017-08-22 (×5): qty 1.5

## 2017-08-22 MED ORDER — ASPIRIN EC 81 MG PO TBEC
81.0000 mg | DELAYED_RELEASE_TABLET | Freq: Every day | ORAL | Status: DC
  Administered 2017-08-22 – 2017-08-24 (×3): 81 mg via ORAL
  Filled 2017-08-22 (×3): qty 1

## 2017-08-22 MED ORDER — LACTATED RINGERS IV SOLN
INTRAVENOUS | Status: DC | PRN
  Administered 2017-08-22: 16:00:00 via INTRAVENOUS

## 2017-08-22 MED ORDER — ONDANSETRON HCL 4 MG/2ML IJ SOLN
INTRAMUSCULAR | Status: AC
  Filled 2017-08-22: qty 2

## 2017-08-22 MED ORDER — ONDANSETRON HCL 4 MG/2ML IJ SOLN: 4.0000 mg | Freq: Four times a day (QID) | INTRAMUSCULAR | Status: DC | PRN

## 2017-08-22 MED ORDER — FENTANYL CITRATE (PF) 100 MCG/2ML IJ SOLN
50.0000 ug | Freq: Once | INTRAMUSCULAR | Status: AC
  Administered 2017-08-22: 50 ug via INTRAVENOUS
  Filled 2017-08-22: qty 1

## 2017-08-22 MED ORDER — ATORVASTATIN CALCIUM 20 MG PO TABS
20.0000 mg | ORAL_TABLET | Freq: Every day | ORAL | Status: DC
  Administered 2017-08-22 – 2017-08-24 (×3): 20 mg via ORAL
  Filled 2017-08-22 (×3): qty 1

## 2017-08-22 MED ORDER — HEPARIN SODIUM (PORCINE) 1000 UNIT/ML IJ SOLN
INTRAMUSCULAR | Status: AC
  Filled 2017-08-22: qty 2

## 2017-08-22 MED ORDER — SODIUM CHLORIDE 0.9 % IV SOLN
INTRAVENOUS | Status: DC | PRN
  Administered 2017-08-22: 1500 mL

## 2017-08-22 MED ORDER — CHLORHEXIDINE GLUCONATE 4 % EX LIQD: 60.0000 mL | Freq: Once | CUTANEOUS | Status: DC

## 2017-08-22 MED ORDER — CHLORHEXIDINE GLUCONATE 4 % EX LIQD: 30.0000 mL | CUTANEOUS | Status: DC

## 2017-08-22 MED ORDER — MIDAZOLAM HCL 2 MG/2ML IJ SOLN
INTRAMUSCULAR | Status: DC | PRN
  Administered 2017-08-22: 1 mg via INTRAVENOUS

## 2017-08-22 MED ORDER — LACTATED RINGERS IV SOLN: 500.0000 mL | Freq: Once | INTRAVENOUS | Status: DC | PRN

## 2017-08-22 MED ORDER — GLIPIZIDE 5 MG PO TABS
5.0000 mg | ORAL_TABLET | Freq: Every day | ORAL | Status: DC
  Administered 2017-08-23 – 2017-08-24 (×2): 5 mg via ORAL
  Filled 2017-08-22 (×2): qty 1

## 2017-08-22 MED ORDER — HEPARIN SODIUM (PORCINE) 1000 UNIT/ML IJ SOLN
INTRAMUSCULAR | Status: DC | PRN
  Administered 2017-08-22: 12000 [IU] via INTRAVENOUS

## 2017-08-22 MED ORDER — FENTANYL CITRATE (PF) 100 MCG/2ML IJ SOLN
INTRAMUSCULAR | Status: AC
  Administered 2017-08-22: 50 ug via INTRAVENOUS
  Filled 2017-08-22: qty 2

## 2017-08-22 MED ORDER — IODIXANOL 320 MG/ML IV SOLN
INTRAVENOUS | Status: DC | PRN
  Administered 2017-08-22: 60.9 mL via INTRA_ARTERIAL

## 2017-08-22 MED ORDER — FENTANYL CITRATE (PF) 250 MCG/5ML IJ SOLN
INTRAMUSCULAR | Status: AC
  Filled 2017-08-22: qty 5

## 2017-08-22 MED ORDER — POTASSIUM CHLORIDE 10 MEQ/50ML IV SOLN: 10.0000 meq | INTRAVENOUS | Status: DC

## 2017-08-22 MED ORDER — LIDOCAINE HCL (PF) 1 % IJ SOLN
INTRAMUSCULAR | Status: AC
  Filled 2017-08-22: qty 30

## 2017-08-22 MED ORDER — OXYCODONE HCL 5 MG PO TABS: 5.0000 mg | ORAL_TABLET | ORAL | Status: DC | PRN

## 2017-08-22 MED ORDER — ACETAMINOPHEN 160 MG/5ML PO SOLN
1000.0000 mg | Freq: Four times a day (QID) | ORAL | Status: DC
  Administered 2017-08-24 (×2): 1000 mg
  Filled 2017-08-22 (×2): qty 40.6

## 2017-08-22 MED ORDER — MIDAZOLAM HCL 2 MG/2ML IJ SOLN
INTRAMUSCULAR | Status: AC
  Filled 2017-08-22: qty 2

## 2017-08-22 MED ORDER — ACETAMINOPHEN 160 MG/5ML PO SOLN: 650.0000 mg | Freq: Once | ORAL | Status: DC

## 2017-08-22 MED ORDER — INSULIN REGULAR BOLUS VIA INFUSION
0.0000 [IU] | Freq: Three times a day (TID) | INTRAVENOUS | Status: DC
  Filled 2017-08-22: qty 10

## 2017-08-22 MED ORDER — PROTAMINE SULFATE 10 MG/ML IV SOLN
INTRAVENOUS | Status: DC | PRN
  Administered 2017-08-22: 40 mg via INTRAVENOUS
  Administered 2017-08-22 (×2): 30 mg via INTRAVENOUS
  Administered 2017-08-22: 20 mg via INTRAVENOUS

## 2017-08-22 MED ORDER — CLOPIDOGREL BISULFATE 75 MG PO TABS
75.0000 mg | ORAL_TABLET | Freq: Every day | ORAL | Status: DC
  Administered 2017-08-22 – 2017-08-24 (×3): 75 mg via ORAL
  Filled 2017-08-22 (×3): qty 1

## 2017-08-22 MED ORDER — NITROGLYCERIN IN D5W 200-5 MCG/ML-% IV SOLN: 0.0000 ug/min | INTRAVENOUS | Status: DC

## 2017-08-22 MED ORDER — LIDOCAINE HCL 1 % IJ SOLN
INTRAMUSCULAR | Status: DC | PRN
  Administered 2017-08-22: 20 mL

## 2017-08-22 MED ORDER — FENTANYL CITRATE (PF) 100 MCG/2ML IJ SOLN
INTRAMUSCULAR | Status: DC | PRN
Start: 2017-08-22 — End: 2017-08-22
  Administered 2017-08-22: 50 ug via INTRAVENOUS

## 2017-08-22 MED ORDER — 0.9 % SODIUM CHLORIDE (POUR BTL) OPTIME
TOPICAL | Status: DC | PRN
  Administered 2017-08-22: 4000 mL

## 2017-08-22 MED ORDER — ALBUMIN HUMAN 5 % IV SOLN: 250.0000 mL | INTRAVENOUS | Status: DC | PRN

## 2017-08-22 MED ORDER — TRAMADOL HCL 50 MG PO TABS: 50.0000 mg | ORAL_TABLET | ORAL | Status: DC | PRN

## 2017-08-22 MED ORDER — SODIUM CHLORIDE 0.9 % IV SOLN: 0.0000 ug/min | INTRAVENOUS | Status: DC

## 2017-08-22 MED ORDER — SODIUM CHLORIDE 0.9 % IV SOLN
1.0000 mL/kg/h | INTRAVENOUS | Status: AC
  Administered 2017-08-22: 1 mL/kg/h via INTRAVENOUS

## 2017-08-22 MED ORDER — PANTOPRAZOLE SODIUM 40 MG PO TBEC
40.0000 mg | DELAYED_RELEASE_TABLET | Freq: Every day | ORAL | Status: DC
  Administered 2017-08-24: 40 mg via ORAL
  Filled 2017-08-22: qty 1

## 2017-08-22 SURGICAL SUPPLY — 100 items
ADAPTER UNIV SWAN GANZ BIP (ADAPTER) ×1 IMPLANT
ADAPTER UNV SWAN GANZ BIP (ADAPTER) ×1
BAG BANDED W/RUBBER/TAPE 36X54 (MISCELLANEOUS) ×2 IMPLANT
BAG DECANTER FOR FLEXI CONT (MISCELLANEOUS) IMPLANT
BAG SNAP BAND KOVER 36X36 (MISCELLANEOUS) ×4 IMPLANT
BLADE CLIPPER SURG (BLADE) IMPLANT
BLADE OSCILLATING /SAGITTAL (BLADE) IMPLANT
BLADE STERNUM SYSTEM 6 (BLADE) ×2 IMPLANT
CABLE ADAPT CONN TEMP 6FT (ADAPTER) ×2 IMPLANT
CANNULA FEM VENOUS REMOTE 22FR (CANNULA) IMPLANT
CANNULA OPTISITE PERFUSION 16F (CANNULA) IMPLANT
CANNULA OPTISITE PERFUSION 18F (CANNULA) IMPLANT
CATH DIAG EXPO 6F VENT PIG 145 (CATHETERS) ×4 IMPLANT
CATH EXPO 5FR AL1 (CATHETERS) ×2 IMPLANT
CATH S G BIP PACING (SET/KITS/TRAYS/PACK) ×4 IMPLANT
CLIP VESOCCLUDE MED 24/CT (CLIP) IMPLANT
CLIP VESOCCLUDE SM WIDE 24/CT (CLIP) IMPLANT
CONT SPEC 4OZ CLIKSEAL STRL BL (MISCELLANEOUS) ×12 IMPLANT
COVER BACK TABLE 24X17X13 BIG (DRAPES) ×2 IMPLANT
COVER BACK TABLE 60X90IN (DRAPES) ×4 IMPLANT
COVER BACK TABLE 80X110 HD (DRAPES) ×2 IMPLANT
COVER DOME SNAP 22 D (MISCELLANEOUS) ×2 IMPLANT
COVER MAYO STAND STRL (DRAPES) ×2 IMPLANT
COVER PROBE W GEL 5X96 (DRAPES) ×2 IMPLANT
CRADLE DONUT ADULT HEAD (MISCELLANEOUS) ×2 IMPLANT
DERMABOND ADVANCED (GAUZE/BANDAGES/DRESSINGS) ×1
DERMABOND ADVANCED .7 DNX12 (GAUZE/BANDAGES/DRESSINGS) ×1 IMPLANT
DEVICE CLOSURE PERCLS PRGLD 6F (VASCULAR PRODUCTS) ×2 IMPLANT
DRAPE INCISE IOBAN 66X45 STRL (DRAPES) IMPLANT
DRAPE SLUSH MACHINE 52X66 (DRAPES) ×2 IMPLANT
DRSG TEGADERM 4X4.75 (GAUZE/BANDAGES/DRESSINGS) ×2 IMPLANT
ELECT REM PT RETURN 9FT ADLT (ELECTROSURGICAL) ×4
ELECTRODE REM PT RTRN 9FT ADLT (ELECTROSURGICAL) ×2 IMPLANT
FELT TEFLON 6X6 (MISCELLANEOUS) IMPLANT
FEMORAL VENOUS CANN RAP (CANNULA) IMPLANT
GAUZE SPONGE 4X4 12PLY STRL (GAUZE/BANDAGES/DRESSINGS) ×2 IMPLANT
GAUZE SPONGE 4X4 12PLY STRL LF (GAUZE/BANDAGES/DRESSINGS) ×2 IMPLANT
GLOVE BIO SURGEON STRL SZ7.5 (GLOVE) ×2 IMPLANT
GLOVE BIO SURGEON STRL SZ8 (GLOVE) ×2 IMPLANT
GLOVE BIO SURGEON STRL SZ8.5 (GLOVE) ×2 IMPLANT
GLOVE BIOGEL PI IND STRL 8.5 (GLOVE) ×1 IMPLANT
GLOVE BIOGEL PI INDICATOR 8.5 (GLOVE) ×1
GLOVE EUDERMIC 7 POWDERFREE (GLOVE) ×4 IMPLANT
GLOVE ORTHO TXT STRL SZ7.5 (GLOVE) ×4 IMPLANT
GOWN STRL REUS W/ TWL LRG LVL3 (GOWN DISPOSABLE) ×3 IMPLANT
GOWN STRL REUS W/ TWL XL LVL3 (GOWN DISPOSABLE) ×6 IMPLANT
GOWN STRL REUS W/TWL LRG LVL3 (GOWN DISPOSABLE) ×3
GOWN STRL REUS W/TWL XL LVL3 (GOWN DISPOSABLE) ×6
GUIDEWIRE SAFE TJ AMPLATZ EXST (WIRE) ×2 IMPLANT
GUIDEWIRE STRAIGHT .035 260CM (WIRE) ×2 IMPLANT
INSERT FOGARTY 61MM (MISCELLANEOUS) IMPLANT
INSERT FOGARTY SM (MISCELLANEOUS) IMPLANT
INSERT FOGARTY XLG (MISCELLANEOUS) IMPLANT
KIT BASIN OR (CUSTOM PROCEDURE TRAY) ×2 IMPLANT
KIT DILATOR VASC 18G NDL (KITS) IMPLANT
KIT HEART LEFT (KITS) ×2 IMPLANT
KIT ROOM TURNOVER OR (KITS) ×2 IMPLANT
KIT SUCTION CATH 14FR (SUCTIONS) IMPLANT
NEEDLE 22X1 1/2 (OR ONLY) (NEEDLE) IMPLANT
NEEDLE PERC 18GX7CM (NEEDLE) ×2 IMPLANT
NS IRRIG 1000ML POUR BTL (IV SOLUTION) ×6 IMPLANT
PACK AORTA (CUSTOM PROCEDURE TRAY) ×2 IMPLANT
PAD ARMBOARD 7.5X6 YLW CONV (MISCELLANEOUS) ×4 IMPLANT
PAD ELECT DEFIB RADIOL ZOLL (MISCELLANEOUS) ×2 IMPLANT
PERCLOSE PROGLIDE 6F (VASCULAR PRODUCTS) ×4
SET MICROPUNCTURE 5F STIFF (MISCELLANEOUS) ×2 IMPLANT
SHEATH AVANTI 11CM 8FR (MISCELLANEOUS) ×2 IMPLANT
SHEATH PINNACLE 6F 10CM (SHEATH) ×4 IMPLANT
SLEEVE REPOSITIONING LENGTH 30 (MISCELLANEOUS) ×2 IMPLANT
SPONGE LAP 4X18 X RAY DECT (DISPOSABLE) IMPLANT
STOPCOCK MORSE 400PSI 3WAY (MISCELLANEOUS) ×12 IMPLANT
SUT ETHIBOND X763 2 0 SH 1 (SUTURE) ×2 IMPLANT
SUT GORETEX CV 4 TH 22 36 (SUTURE) ×2 IMPLANT
SUT GORETEX CV4 TH-18 (SUTURE) ×6 IMPLANT
SUT GORETEX TH-18 36 INCH (SUTURE) ×4 IMPLANT
SUT MNCRL AB 3-0 PS2 18 (SUTURE) ×2 IMPLANT
SUT PROLENE 3 0 SH1 36 (SUTURE) IMPLANT
SUT PROLENE 4 0 RB 1 (SUTURE) ×1
SUT PROLENE 4-0 RB1 .5 CRCL 36 (SUTURE) ×1 IMPLANT
SUT PROLENE 5 0 C 1 36 (SUTURE) ×4 IMPLANT
SUT PROLENE 6 0 C 1 30 (SUTURE) ×4 IMPLANT
SUT SILK  1 MH (SUTURE) ×1
SUT SILK 1 MH (SUTURE) ×1 IMPLANT
SUT SILK 2 0 SH CR/8 (SUTURE) IMPLANT
SUT VIC AB 2-0 CT1 27 (SUTURE) ×1
SUT VIC AB 2-0 CT1 TAPERPNT 27 (SUTURE) ×1 IMPLANT
SUT VIC AB 2-0 CTX 36 (SUTURE) IMPLANT
SUT VIC AB 3-0 SH 8-18 (SUTURE) ×4 IMPLANT
SYR 10ML LL (SYRINGE) ×6 IMPLANT
SYR 30ML LL (SYRINGE) ×4 IMPLANT
SYR 50ML LL SCALE MARK (SYRINGE) ×2 IMPLANT
SYR CONTROL 10ML LL (SYRINGE) IMPLANT
TAPE CLOTH SURG 4X10 WHT LF (GAUZE/BANDAGES/DRESSINGS) ×2 IMPLANT
TOWEL OR 17X26 10 PK STRL BLUE (TOWEL DISPOSABLE) ×4 IMPLANT
TRANSDUCER W/STOPCOCK (MISCELLANEOUS) ×4 IMPLANT
TRAY FOLEY SILVER 16FR TEMP (SET/KITS/TRAYS/PACK) ×2 IMPLANT
TUBING HIGH PRESSURE 120CM (CONNECTOR) ×2 IMPLANT
VALVE HEART TRANSCATH SZ3 26MM (Prosthesis & Implant Heart) ×2 IMPLANT
WIRE AMPLATZ SS-J .035X180CM (WIRE) ×2 IMPLANT
WIRE MINI STICK MAX (SHEATH) IMPLANT

## 2017-08-22 NOTE — CV Procedure (Signed)
[facility]  TAVR OPERATIVE NOTE   Date of Procedure:  08/22/2017  Preoperative Diagnosis: Severe Aortic Stenosis   Postoperative Diagnosis: Same   Procedure:    Transcatheter Aortic Valve Replacement - Transfemoral Approach  Edwards Sapien 3 THV (size 26 mm, model #[id number] , serial # [id number])   Co-Surgeons:  Lauree Chandler, MD and Gaye Pollack, MD   Anesthesiologist:  Linna Caprice  Echocardiographer:  Aundra Dubin  Pre-operative Echo Findings:  Severe aortic stenosis  Normal left ventricular systolic function  Post-operative Echo Findings:  No paravalvular leak  Normal left ventricular systolic function  BRIEF CLINICAL NOTE AND INDICATIONS FOR SURGERY  71 yo male with history of HTN, HLD, DM, prior tobacco abuse, CAD and severe AS who is here today for TAVR. 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 Echo with severe AS, normal LV systolic function. He has had progressive dyspnea and fatigue.   During the course of the patient's preoperative work up they have been evaluated comprehensively by a multidisciplinary team of specialists coordinated through the [facility] in the [facility].  They have been demonstrated to suffer from symptomatic severe aortic stenosis as noted above. The patient has been counseled extensively as to the relative risks and benefits of all options for the treatment of severe aortic stenosis including long term medical therapy, conventional surgery for aortic valve replacement, and transcatheter aortic valve replacement.  The patient has been independently evaluated by two cardiac surgeons including Dr Roxy Manns and Dr. Cyndia Bent, and they are felt to be at high risk for conventional surgical aortic valve replacement. Both surgeons indicated the patient would be a poor candidate for conventional surgery. Based upon review of all of the patient's preoperative  diagnostic tests they are felt to be candidate for transcatheter aortic valve replacement using the transfemoral approach as an alternative to high risk conventional surgery.    Following the decision to proceed with transcatheter aortic valve replacement, a discussion has been held regarding what types of management strategies would be attempted intraoperatively in the event of life-threatening complications, including whether or not the patient would be considered a candidate for the use of cardiopulmonary bypass and/or conversion to open sternotomy for attempted surgical intervention.  The patient has been advised of a variety of complications that might develop peculiar to this approach including but not limited to risks of death, stroke, paravalvular leak, aortic dissection or other major vascular complications, aortic annulus rupture, device embolization, cardiac rupture or perforation, acute myocardial infarction, arrhythmia, heart block or bradycardia requiring permanent pacemaker placement, congestive heart failure, respiratory failure, renal failure, pneumonia, infection, other late complications related to structural valve deterioration or migration, or other complications that might ultimately cause a temporary or permanent loss of functional independence or other long term morbidity.  The patient provides full informed consent for the procedure as described and all questions were answered preoperatively.    DETAILS OF THE OPERATIVE PROCEDURE  PREPARATION:   The patient is brought to the operating room on the above mentioned date and central monitoring was established by the anesthesia team including placement of a radial arterial line. The patient is placed in the supine position on the operating table.  Intravenous antibiotics are administered. Conscious sedation is used.   Baseline transthoracic echocardiogram was performed. The patient's chest, abdomen, both groins, and both lower  extremities are prepared and draped in a sterile manner. A time  out procedure is performed.   PERIPHERAL ACCESS:   Using the modified Seldinger technique, femoral arterial and venous access were obtained with placement of 6 Fr sheaths on the right side.  A pigtail diagnostic catheter was passed through the femoral arterial sheath under fluoroscopic guidance into the aortic root.  A temporary transvenous pacemaker catheter was passed through the femoral venous sheath under fluoroscopic guidance into the right ventricle.  The pacemaker was tested to ensure stable lead placement and pacemaker capture. Aortic root angiography was performed in order to determine the optimal angiographic angle for valve deployment.  TRANSFEMORAL ACCESS:  A micropuncture kit was used to gain access to the left femoral artery. Position confirmed with angiography. Pre-closure with double ProGlide closure devices. The patient was heparinized systemically and ACT verified > 250 seconds.    A 14 Fr transfemoral E-sheath was introduced into the left femoral artery after progressively dilating over an Amplatz superstiff wire. An AL-2 catheter was used to direct a straight-tip exchange length wire across the native aortic valve into the left ventricle. This was exchanged out for a pigtail catheter and position was confirmed in the LV apex. Simultaneous LV and Ao pressures were recorded.  The pigtail catheter was then exchanged for an Amplatz Extra-stiff wire in the LV apex.   TRANSCATHETER HEART VALVE DEPLOYMENT:  An Edwards Sapien 3 THV (size 26 mm) was prepared and crimped per manufacturer's guidelines, and the proper orientation of the valve is confirmed on the [employer] delivery system. The valve was advanced through the introducer sheath using normal technique until in an appropriate position in the abdominal aorta beyond the sheath tip. The balloon was then retracted and using the fine-tuning wheel was centered on the  valve. The valve was then advanced across the aortic arch using appropriate flexion of the catheter. The valve was carefully positioned across the aortic valve annulus. The Commander catheter was retracted using normal technique. Once final position of the valve has been confirmed by angiographic assessment, the valve is deployed while temporarily holding ventilation and during rapid ventricular pacing to maintain systolic blood pressure < 50 mmHg and pulse pressure < 10 mmHg. The balloon inflation is held for >3 seconds after reaching full deployment volume. Once the balloon has fully deflated the balloon is retracted into the ascending aorta and valve function is assessed using TEE. There is felt to be no paravalvular leak and no central aortic insufficiency.  The patient's hemodynamic recovery following valve deployment is good.  The deployment balloon and guidewire are both removed. Echo demostrated acceptable post-procedural gradients, stable mitral valve function, and no AI.   PROCEDURE COMPLETION:  The sheath was then removed and closure devices were completed. Protamine was administered once femoral arterial repair was complete. The temporary pacemaker, pigtail catheters and femoral sheaths were removed with manual pressure used for hemostasis.   The patient tolerated the procedure well and is transported to the surgical intensive care in stable condition. There were no immediate intraoperative complications. All sponge instrument and needle counts are verified correct at completion of the operation.   No blood products were administered during the operation.  The patient received a total of 60.9 mL of intravenous contrast during the procedure.  Lauree Chandler MD 08/22/2017 5:44 PM

## 2017-08-22 NOTE — Anesthesia Procedure Notes (Signed)
Procedure Name: MAC Date/Time: 08/22/2017 3:58 PM Performed by: Barrington Ellison, CRNA Pre-anesthesia Checklist: Patient identified, Emergency Drugs available, Suction available, Patient being monitored and Timeout performed Patient Re-evaluated:Patient Re-evaluated prior to induction Oxygen Delivery Method: Simple face mask

## 2017-08-22 NOTE — Progress Notes (Signed)
TAVR Team/Interventional Cardiology Note  [name] is a pleasant 71 yo male with severe AS, CAD, HTN, HLD, DM with recent MI and multiple coronary stent placements who is here today for TAVR. He has worked as a Nurse, mental health. He lives in [place], [place] with his wife. He had an acute MI in [place] in March 2018 and had stents placed in the LAD, Diagonal and RCA. He was seen in follow up by Dr. Domenic Polite and referred to our TAVR team after being found to have severe AS. During his workup for TAVR, he was found to have a pancreatic mass and a renal mass. 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 He has been seen by Dr. Barry Dienes in surgery and Dr. Benay Spice in Oncology. Plans being made for chemotherapy and XRT with possibility of resection of his pancreatic mass.   He is here today for TAVR. Plans for access in both femoral arteries with the left side being our side for delivery of the device. Labs reviewed and ok.   Manuel Olson 08/22/2017 12:01 PM

## 2017-08-22 NOTE — Transfer of Care (Signed)
Immediate Anesthesia Transfer of Care Note  Patient: Manuel Olson  Procedure(s) Performed: TRANSCATHETER AORTIC VALVE REPLACEMENT, TRANSFEMORAL (N/A Chest) TRANSESOPHAGEAL ECHOCARDIOGRAM (TEE) (N/A Chest)  Patient Location: ICU  Anesthesia Type:MAC  Level of Consciousness: lethargic  Airway & Oxygen Therapy: Patient Spontanous Breathing and Patient connected to face mask oxygen  Post-op Assessment: Report given to RN  Post vital signs: Reviewed and stable  Last Vitals:  Vitals:   08/22/17 1728 08/22/17 1733  BP:    Pulse: (!) 56 (!) 0  Resp:    Temp:    SpO2:      Last Pain:  Vitals:   08/22/17 1051  TempSrc: Oral         Complications: No apparent anesthesia complications

## 2017-08-22 NOTE — Progress Notes (Signed)
TCTS BRIEF SICU PROGRESS NOTE  Day of Surgery  S/P Procedure(s) (LRB): TRANSCATHETER AORTIC VALVE REPLACEMENT, TRANSFEMORAL (N/A) TRANSESOPHAGEAL ECHOCARDIOGRAM (TEE) (N/A)   Looks good No complaints NSR w/ stable BP Both groins okay  Plan: Continue routine early post TAVR  Rexene Alberts, MD 08/22/2017 8:47 PM

## 2017-08-22 NOTE — Op Note (Signed)
[facility]   MULTIDISCIPLINARY HEART VALVE TEAM   TAVR OPERATIVE NOTE   Date of Procedure:  08/22/2017  Preoperative Diagnosis: Severe Aortic Stenosis   Postoperative Diagnosis: Same   Procedure:    Transcatheter Aortic Valve Replacement - Percutaneous Left Transfemoral Approach  Edwards Sapien 3 THV (size 26 mm, model # 9600TFX, serial # [id number])   Co-Surgeons: Gaye Pollack, MD and Lauree Chandler, MD      Anesthesiologist:  Roberts Gaudy, MD  Echocardiographer:  Loralie Champagne, MD  Pre-operative Echo Findings:  Severe aortic stenosis  Normal left ventricular systolic function  Post-operative Echo Findings:  no paravalvular leak  normal left ventricular systolic function   BRIEF CLINICAL NOTE AND INDICATIONS FOR SURGERY  The patient is a 71 year old gentleman with history of hypertension, hyperlipidemia, type 2 diabetes, long history of prior heavy smoking, and coronary artery disease who was traveling in [place] in March 2018 and suffered an acute ST segment elevation MI. He was taken by his wife to [facility] and underwent emergent catheterization showing 70% stenosis in the mid LAD, occlusion of a large diagonal branch, 90% proximal right coronary stenosis, and 60-70% mid right coronary stenosis. Left ventricular function was preserved. He underwent PCI with drug-eluting stents in the mid LAD, diagonal branch, and proximal and mid right coronary artery. He was told that he also had aortic stenosis and was to follow-up with a cardiologist as soon as he returned to [facility]. He reports a long history of a heart murmur which was never worked up. Upon returning he was evaluated by Dr. Domenic Polite in April and underwent an echocardiogram showing severe aortic stenosis with a mean gradient of 49 mmHg and a dimensionless index of 0.18. His ejection fraction was 55-60%. He says that he was feeling fairly well at that time  and quit smoking. He saw Dr. Domenic Polite again in early October and a repeat echocardiogram showed a mean aortic valve gradient of 43 mmHg with a dimensionless index of 0.22. Left ventricular ejection fraction remains normal at 55-60%. He was referred to Dr. Burt Knack and underwent cardiac catheterization on 07/05/2017 which showed continued patency of the stented segments in his LAD, first diagonal, and right coronary artery. There was mild diffuse nonobstructive residual coronary artery disease. The mean aortic valve gradient was measured at 33 mmHg with a peak to peak gradient of 42 mmHg. He says that over the course of the summer he developed progressive exertional shortness of breath and fatigue. This does not occur with normal walking on level ground but if he walks uphill or does any strenuous activity he notices it immediately. He has not had any chest pain or pressure. He denies any dizziness or syncope. He has had no orthopnea. His wife says that he has not been as active since he returned home from [place] and wants to rest more.  After being seen by Dr. Burt Knack he underwent workup for TAVR. His CT scan showed a mass in the head of his pancreas as well as a large left renal tumor. 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 He has been seen by Dr. Julieanne Manson as well as by 1 of the urologist. He has been evaluated by Dr. Barry Dienes and the plan is for neoadjuvant chemotherapy and possibly radiotherapy with subsequent resection of the pancreatic mass as well as left nephrectomy.   This 71 year old gentleman has stage D, severe, symptomatic  aortic stenosis with New York Heart Association class IIb symptoms of exertional shortness of breath and fatigue consistent with chronic diastolic heart failure. I have personally reviewed his echocardiogram, cardiac catheterization, and CTA studies. His echo shows a trileaflet aortic valve  with severe thickening and calcification of the leaflets with restricted mobility. The mean transvalvular gradient is 43 mmHg consistent with severe aortic stenosis. Cardiac catheterization shows continued patency of his previously placed stents with otherwise mild nonobstructive disease. I agree with the need for aortic valve replacement. 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 Although he would be at low risk for open surgical aortic valve replacement I think TAVR is a much better option for him since we will allow a much quicker recovery so that he can proceed with neoadjuvant treatment for his pancreatic carcinoma and hopefully subsequent resection of his pancreatic carcinoma and left kidney. His aortic stenosis is severe and I do not think he would tolerate surgical resection of his pancreas and left kidney without aortic valve replacement. His gated cardiac CTA shows anatomy favorable for TAVR using a Sapien 3 valve. His abdominal and pelvic CT shows fairly extensive ulcerated atherosclerotic plaque throughout the abdominal aorta and iliac arteries bilaterally. 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 The size of his pelvic vasculature is adequate for transfemoral insertion but it may be best to use his left side to avoid the aneurysmal disease.  The patient and his wife were counseled at length regarding treatment alternatives for management of severe symptomatic aortic stenosis. The risks and benefits of surgical intervention has been discussed in detail. Long-term prognosis with medical therapy  was discussed. Alternative approaches such as conventional surgical aortic valve replacement, transcatheter aortic valve replacement, and palliative medical therapy were compared and contrasted at length. This discussion was placed in the context of the patient's own specific clinical presentation and past medical history. All of their questions have been addressed. The patient is eager to proceed with surgical management as soon as possible.  Following the decision to proceed with transcatheter aortic valve replacement, a discussion was held regarding what types of management strategies would be attempted intraoperatively in the event of life-threatening complications, including whether or not the patient would be considered a candidate for the use of cardiopulmonary bypass and/or conversion to open sternotomy for attempted surgical intervention. The patient has been advised of a variety of complications that might develop including but not limited to risks of death, stroke, paravalvular leak, aortic dissection or other major vascular complications, aortic annulus rupture, device embolization, cardiac rupture or perforation, mitral regurgitation, acute myocardial infarction, arrhythmia, heart block or bradycardia requiring permanent pacemaker placement, congestive heart failure, respiratory failure, renal failure, pneumonia, infection, other late complications related to structural valve deterioration or migration, or other complications that might ultimately cause a temporary or permanent loss of functional independence or other long term morbidity. The patient provides full informed consent for the procedure as described and all questions were answered.     DETAILS OF THE OPERATIVE PROCEDURE  PREPARATION:    The patient is brought to the operating room on the  above mentioned date and central monitoring was established by the anesthesia team including placement of a central venous line and radial  arterial line. The patient is placed in the supine position on the operating table.  Intravenous antibiotics are administered. The patient is monitored closely throughout the procedure under conscious sedation.    Baseline transthoracic echocardiogram was performed. The patient's chest, abdomen, both groins, and both lower extremities are prepared and draped in a sterile manner. A time out procedure is performed.   PERIPHERAL ACCESS:    Using the modified Seldinger technique, femoral arterial and venous access was obtained with placement of 6 Fr sheaths on the right side.  A pigtail diagnostic catheter was passed through the right arterial sheath under fluoroscopic guidance into the aortic root.  A temporary transvenous pacemaker catheter was passed through the right femoral venous sheath under fluoroscopic guidance into the right ventricle.  The pacemaker was tested to ensure stable lead placement and pacemaker capture. Aortic root angiography was performed in order to determine the optimal angiographic angle for valve deployment.   TRANSFEMORAL ACCESS:   Percutaneous transfemoral access and sheath placement was performed by Dr. Angelena Form using ultrasound guidance.  The left common femoral artery was cannulated using a micropuncture needle and appropriate location was verified using hand injection angiogram.  A pair of Abbott Perclose percutaneous closure devices were placed and a 6 French sheath replaced into the femoral artery.  The patient was heparinized systemically and ACT verified > 250 seconds.    A 14 Fr transfemoral E-sheath was introduced into the left femoral artery after progressively dilating over an Amplatz superstiff wire. An AL-2 catheter was used to direct a straight-tip exchange length wire across the native aortic valve into the left ventricle. This was exchanged out for a pigtail catheter and position was confirmed in the LV apex. Simultaneous LV and Ao pressures were recorded.   The pigtail catheter was exchanged for an Amplatz Extra-stiff wire in the LV apex.  Echocardiography was utilized to confirm appropriate wire position and no sign of entanglement in the mitral subvalvular apparatus.   BALLOON AORTIC VALVULOPLASTY:   Not performed  TRANSCATHETER HEART VALVE DEPLOYMENT:   An Edwards Sapien 3 transcatheter heart valve (size 26 mm, model #9600TFX, serial #[id number]) was prepared and crimped per manufacturer's guidelines, and the proper orientation of the valve is confirmed on the [employer] delivery system. The valve was advanced through the introducer sheath using normal technique until in an appropriate position in the abdominal aorta beyond the sheath tip. The balloon was then retracted and using the fine-tuning wheel was centered on the valve. The valve was then advanced across the aortic arch using appropriate flexion of the catheter. The valve was carefully positioned across the aortic valve annulus. The Commander catheter was retracted using normal technique. Once final position of the valve has been confirmed by angiographic assessment, the valve is deployed while temporarily holding ventilation and during rapid ventricular pacing to maintain systolic blood pressure < 50 mmHg and pulse pressure < 10 mmHg. The balloon inflation is held for >3 seconds after reaching full deployment volume. Once the balloon has fully deflated the balloon is retracted into the ascending aorta and valve function is assessed using echocardiography. There is felt to be no paravalvular leak and no central aortic insufficiency.  The patient's hemodynamic recovery following valve deployment is good.  The deployment balloon and guidewire are both removed.    PROCEDURE COMPLETION:   The sheath was removed and femoral  artery closure performed by Dr Angelena Form.  Protamine was administered once femoral arterial repair was complete. The temporary pacemaker, pigtail catheters and femoral sheaths  were removed with manual pressure used for hemostasis.   The patient tolerated the procedure well and is transported to the surgical intensive care in stable condition. There were no immediate intraoperative complications. All sponge instrument and needle counts are verified correct at completion of the operation.   No blood products were administered during the operation.  The patient received a total of 60.9 mL of intravenous contrast during the procedure.   Gaye Pollack, MD 08/22/2017

## 2017-08-22 NOTE — Anesthesia Procedure Notes (Signed)
Central Venous Catheter Insertion Performed by: Suzette Battiest, MD, anesthesiologist Start/End12/07/2017 1:30 PM, 08/22/2017 1:40 PM Patient location: Pre-op. Preanesthetic checklist: patient identified, IV checked, site marked, risks and benefits discussed, surgical consent, monitors and equipment checked, pre-op evaluation, timeout performed and anesthesia consent Position: Trendelenburg Lidocaine 1% used for infiltration and patient sedated Hand hygiene performed , maximum sterile barriers used  and Seldinger technique used Catheter size: 8 Fr Total catheter length 16. Central line was placed.Double lumen Procedure performed using ultrasound guided technique. Ultrasound Notes:anatomy identified, needle tip was noted to be adjacent to the nerve/plexus identified, no ultrasound evidence of intravascular and/or intraneural injection and image(s) printed for medical record Attempts: 1 Following insertion, dressing applied, line sutured and Biopatch. Post procedure assessment: blood return through all ports  Patient tolerated the procedure well with no immediate complications.

## 2017-08-22 NOTE — Progress Notes (Signed)
6 french sheath removed from right femoral artery and 6 french sheath removed from right femoral vein.  Pressure held x 20 minutes.  Site looks good with no hematoma level 0.  Dressed with 4x4 and tegaderm.  Distal pulses good 2+ right dp. Vitals stable throughout hold.  Instructions given for bedrest but patient very sleepy.  RN will continue to monitor site.

## 2017-08-22 NOTE — Anesthesia Preprocedure Evaluation (Addendum)
Anesthesia Evaluation  Patient identified by MRN, date of birth, ID band Patient awake    Reviewed: Allergy & Precautions, NPO status , Patient's Chart, lab work & pertinent test results, reviewed documented beta blocker date and time   Airway Mallampati: II  TM Distance: >3 FB Neck ROM: Full    Dental  (+) Dental Advisory Given, Edentulous Upper, Edentulous Lower   Pulmonary shortness of breath, former smoker,    breath sounds clear to auscultation       Cardiovascular hypertension, Pt. on medications and Pt. on home beta blockers + CAD, + Past MI and + Cardiac Stents  + Valvular Problems/Murmurs AS  Rhythm:Regular Rate:Normal     Neuro/Psych CVA    GI/Hepatic GERD  ,Mass of pancreatic head   Endo/Other  diabetes, Type 2  Renal/GU Renal mass     Musculoskeletal  (+) Arthritis ,   Abdominal   Peds  Hematology negative hematology ROS (+)   Anesthesia Other Findings   Reproductive/Obstetrics                            Lab Results  Component Value Date   WBC 8.1 08/17/2017   HGB 13.0 08/17/2017   HCT 39.1 08/17/2017   MCV 90.3 08/17/2017   PLT 139 (L) 08/17/2017   Lab Results  Component Value Date   CREATININE 1.07 08/17/2017   BUN 20 08/17/2017   NA 132 (L) 08/17/2017   K 4.4 08/17/2017   CL 102 08/17/2017   CO2 21 (L) 08/17/2017    Anesthesia Physical Anesthesia Plan  ASA: IV  Anesthesia Plan: MAC   Post-op Pain Management:    Induction: Intravenous  PONV Risk Score and Plan: 1 and Ondansetron, Propofol infusion and Treatment may vary due to age or medical condition  Airway Management Planned: Natural Airway and Simple Face Mask  Additional Equipment:   Intra-op Plan:   Post-operative Plan:   Informed Consent: I have reviewed the patients History and Physical, chart, labs and discussed the procedure including the risks, benefits and alternatives for the proposed  anesthesia with the patient or authorized representative who has indicated his/her understanding and acceptance.     Plan Discussed with: CRNA  Anesthesia Plan Comments:        Anesthesia Quick Evaluation

## 2017-08-22 NOTE — Anesthesia Postprocedure Evaluation (Signed)
Anesthesia Post Note  Patient: Manuel Olson  Procedure(s) Performed: TRANSCATHETER AORTIC VALVE REPLACEMENT, TRANSFEMORAL (N/A Chest) TRANSESOPHAGEAL ECHOCARDIOGRAM (TEE) (N/A Chest)     Patient location during evaluation: SICU Anesthesia Type: MAC and General Level of consciousness: awake, awake and alert and oriented Pain management: pain level controlled Vital Signs Assessment: post-procedure vital signs reviewed and stable Respiratory status: spontaneous breathing, nonlabored ventilation and respiratory function stable Cardiovascular status: blood pressure returned to baseline Postop Assessment: no headache Anesthetic complications: no    Last Vitals:  Vitals:   08/22/17 1900 08/22/17 2000  BP: (!) 79/54 131/62  Pulse: (!) 57 (!) 59  Resp: 14 15  Temp:  (!) 36.2 C  SpO2: 97% 90%    Last Pain:  Vitals:   08/22/17 1800  TempSrc: Axillary                 Cincere Zorn COKER

## 2017-08-22 NOTE — Progress Notes (Signed)
  Echocardiogram 2D Echocardiogram has been performed.  Bobbye Charleston 08/22/2017, 5:26 PM

## 2017-08-23 ENCOUNTER — Encounter (HOSPITAL_COMMUNITY): Payer: Self-pay | Admitting: Cardiovascular Disease

## 2017-08-23 ENCOUNTER — Other Ambulatory Visit: Payer: Self-pay

## 2017-08-23 ENCOUNTER — Inpatient Hospital Stay (HOSPITAL_COMMUNITY): Payer: Medicare Other

## 2017-08-23 DIAGNOSIS — I350 Nonrheumatic aortic (valve) stenosis: Secondary | ICD-10-CM

## 2017-08-23 DIAGNOSIS — Z952 Presence of prosthetic heart valve: Secondary | ICD-10-CM

## 2017-08-23 LAB — ECHOCARDIOGRAM COMPLETE
AO mean calculated velocity dopler: 182 cm/s
AV Area VTI index: 1.31 cm2/m2
AV Area VTI: 2.63 cm2
AV Area mean vel: 2.22 cm2
AV Mean grad: 15 mmHg
AV Peak grad: 26 mmHg
AV VEL mean LVOT/AV: 0.64
AV area mean vel ind: 1.14 cm2/m2
AV peak Index: 1.35
AV pk vel: 254 cm/s
AV vel: 2.56
Ao pk vel: 0.76 m/s
E/e' ratio: 12.82
FS: 24 % — AB (ref 28–44)
IVS/LV PW RATIO, ED: 0.83
LA ID, A-P, ES: 39 mm
LA diam end sys: 39 mm
LA diam index: 2 cm/m2
LA vol A4C: 56.5 ml
LA vol index: 31.9 mL/m2
LA vol: 62.2 mL
LV E/e' medial: 12.82
LV E/e'average: 12.82
LV PW d: 12 mm — AB (ref 0.6–1.1)
LV e' LATERAL: 7.88 cm/s
LVOT SV: 120 mL
LVOT VTI: 34.8 cm
LVOT area: 3.46 cm2
LVOT diameter: 21 mm
LVOT peak VTI: 0.74 cm
LVOT peak grad rest: 15 mmHg
LVOT peak vel: 193 cm/s
Lateral S' vel: 16.8 cm/s
MV Peak grad: 4 mmHg
MV pk A vel: 154 m/s
MV pk E vel: 101 m/s
TAPSE: 31.3 mm
TDI e' lateral: 7.88
TDI e' medial: 6.08
VTI: 47.1 cm
Valve area index: 1.31
Valve area: 2.56 cm2
Weight: 2710.4 oz

## 2017-08-23 LAB — GLUCOSE, CAPILLARY
Glucose-Capillary: 140 mg/dL — ABNORMAL HIGH (ref 65–99)
Glucose-Capillary: 144 mg/dL — ABNORMAL HIGH (ref 65–99)
Glucose-Capillary: 155 mg/dL — ABNORMAL HIGH (ref 65–99)
Glucose-Capillary: 171 mg/dL — ABNORMAL HIGH (ref 65–99)
Glucose-Capillary: 238 mg/dL — ABNORMAL HIGH (ref 65–99)

## 2017-08-23 LAB — POCT I-STAT 4, (NA,K, GLUC, HGB,HCT)
Glucose, Bld: 194 mg/dL — ABNORMAL HIGH (ref 65–99)
HCT: 33 % — ABNORMAL LOW (ref 39.0–52.0)
Hemoglobin: 11.2 g/dL — ABNORMAL LOW (ref 13.0–17.0)
Potassium: 4.3 mmol/L (ref 3.5–5.1)
Sodium: 140 mmol/L (ref 135–145)

## 2017-08-23 LAB — BASIC METABOLIC PANEL
Anion gap: 7 (ref 5–15)
BUN: 21 mg/dL — ABNORMAL HIGH (ref 6–20)
CO2: 21 mmol/L — ABNORMAL LOW (ref 22–32)
Calcium: 8.6 mg/dL — ABNORMAL LOW (ref 8.9–10.3)
Chloride: 104 mmol/L (ref 101–111)
Creatinine, Ser: 0.97 mg/dL (ref 0.61–1.24)
GFR calc Af Amer: >60 mL/min (ref >60)
GFR calc non Af Amer: >60 mL/min (ref >60)
Glucose, Bld: 236 mg/dL — ABNORMAL HIGH (ref 65–99)
Potassium: 4.2 mmol/L (ref 3.5–5.1)
Sodium: 132 mmol/L — ABNORMAL LOW (ref 135–145)

## 2017-08-23 LAB — CBC
HCT: 35.2 % — ABNORMAL LOW (ref 39.0–52.0)
Hemoglobin: 11.6 g/dL — ABNORMAL LOW (ref 13.0–17.0)
MCH: 30.3 pg (ref 26.0–34.0)
MCHC: 33 g/dL (ref 30.0–36.0)
MCV: 91.9 fL (ref 78.0–100.0)
Platelets: 102 10*3/uL — ABNORMAL LOW (ref 150–400)
RBC: 3.83 MIL/uL — ABNORMAL LOW (ref 4.22–5.81)
RDW: 14.1 % (ref 11.5–15.5)
WBC: 8.8 10*3/uL (ref 4.0–10.5)

## 2017-08-23 LAB — MAGNESIUM: Magnesium: 1.8 mg/dL (ref 1.7–2.4)

## 2017-08-23 MED ORDER — INSULIN ASPART 100 UNIT/ML ~~LOC~~ SOLN
0.0000 [IU] | Freq: Three times a day (TID) | SUBCUTANEOUS | Status: DC
  Administered 2017-08-23: 3 [IU] via SUBCUTANEOUS
  Administered 2017-08-23: 1 [IU] via SUBCUTANEOUS
  Administered 2017-08-24: 3 [IU] via SUBCUTANEOUS

## 2017-08-23 MED ORDER — INSULIN ASPART 100 UNIT/ML ~~LOC~~ SOLN: 0.0000 [IU] | Freq: Every evening | SUBCUTANEOUS | Status: DC | PRN

## 2017-08-23 MED ORDER — LISINOPRIL 10 MG PO TABS
10.0000 mg | ORAL_TABLET | Freq: Every day | ORAL | Status: DC
  Administered 2017-08-23 – 2017-08-24 (×2): 10 mg via ORAL
  Filled 2017-08-23 (×2): qty 1

## 2017-08-23 NOTE — Progress Notes (Signed)
  Echocardiogram 2D Echocardiogram has been performed.  Johny Chess 08/23/2017, 10:46 AM

## 2017-08-23 NOTE — Progress Notes (Addendum)
Inpatient Diabetes Program Recommendations  AACE/ADA: New Consensus Statement on Inpatient Glycemic Control (2015)  Target Ranges:  Prepandial:   less than 140 mg/dL      Peak postprandial:   less than 180 mg/dL (1-2 hours)      Critically ill patients:  140 - 180 mg/dL  Results for DUSTIN, BUMBAUGH (MRN [id number]) as of 08/23/2017 08:42  Ref. Range 08/23/2017 04:11  Glucose Latest Ref Range: 65 - 99 mg/dL 236 (H)   Results for DANGELO, GUZZETTA (MRN [id number]) as of 08/23/2017 08:42  Ref. Range 08/22/2017 10:49 08/22/2017 13:00 08/22/2017 23:55  Glucose-Capillary Latest Ref Range: 65 - 99 mg/dL 251 (H) 202 (H) 155 (H)   Results for KENSLEY, LARES (MRN [id number]) as of 08/23/2017 08:42  Ref. Range 08/17/2017 08:52  Hemoglobin A1C Latest Ref Range: 4.8 - 5.6 % 10.4 (H)    Review of Glycemic Control  Diabetes history: DM2 Outpatient Diabetes medications: Glipizide 5 mg QAM Current orders for Inpatient glycemic control: Glipizide 5 mg QAM, Regular IV insulin 0-10 units TID with meals  Inpatient Diabetes Program Recommendations: Correction (SSI): Please consider ordering CBGs wtih Novolog 0-9 units TID with meals and Novolog 0-5 units QHS. Insulin - Meal Coverage: Please discontinue current order for IV Regular insulin with meals. HgbA1C: A1C 10.4% on 08/17/17 indicating an average glucose of 252 mg/dl over the past 2-3 months. Noted recently new dx of pancreatic cancer which may be contributing to elevated A1C.  Addendum 08/23/17@13 :20-Called patient's room (Diabetes Coordinator working from [employer]) and patient was in restroom. Informed patient's wife that I would like to talk with patient about DM control and discharge planning. Patient's wife stated that she could probably answer questions and provided some back ground information. Patient has been seeing PCP in [place] for DM control and the provider he was seeing has left that particular practice and patient  will need to find a new PCP. Patient has been checking glucose at home since he went to the pre-admission appointment and patient's wife reports that his glucose has been consistently over 200 mg/dl at home despite taking Glipizide 5 mg daily. Patient has been on same dose of Glipizide for about 1 year. Patient's wife reports that he recently has been informed he has a pancreatic mass and was to have a PET scan but it had to be cancelled because his glucose was too high. In order to have a PET scan, glucose has to be fairly well controlled.  Explained that with pancreatic mass, there could be an issue with the pancreas cells that make insulin which could be causing elevation in glucose. Inquired about an Musician and patient's wife reports that he does not have an Musician. Informed her that Dr. Dorris Fetch Careers adviser) is in [place] which is closer to home. Patient's wife states that she would like to have her husband establish care with Dr. Dorris Fetch. Called Dr. Liliane Channel office and made an appointment for 08/25/17 at 8:15 am. Informed patient's wife about appointment and she expressed gratitude for getting an appointment quickly. If glucose continues to be elevated, MD may want to consider increasing Glipizide to BID dosing. Patient will need to be monitoring glucose closely to provide information on glucose trends to Dr. Dorris Fetch for further medication adjustments. Patient's wife verbalized understanding of information discussed. Will plan to follow up on patient first thing in the morning.  Thanks, Barnie Alderman, RN, MSN, CDE Diabetes Coordinator Inpatient Diabetes Program [phone] (Team Pager from 8am to 5pm)

## 2017-08-23 NOTE — Progress Notes (Signed)
Anesthesiology Follow-up:  Awake and alert, in good spirits, neuro intact, walking without assistance.  VS: T- 36.2 BP- 149/65 HR- 80 (SR)  RR- 20 O2 Sat 94% on RA  K-4.2 BUN/Cr. 21/0.97 glucose- 236 H/H- 11.6/35.2 platelets- 79,9  71 year old male one day S/P TAVR. Doing well, uneventful post-op course.  Roberts Gaudy

## 2017-08-23 NOTE — Discharge Instructions (Signed)
ACTIVITY AND EXERCISE  Daily activity and exercise are an important part of your recovery. People recover at different rates depending on their general health and type of valve procedure.  Most people recovering from TAVR feel better relatively quickly   No lifting, pushing, pulling more than 10 pounds (examples to avoid: groceries, vacuuming, gardening, golfing):             - For one week with a procedure through the groin.             - For six weeks for procedures through the chest wall.             - For three months for procedures through the breast-bone. NOTE: You will typically see one of our providers 7-10 days after your procedure to discuss [place] the above activities.    DRIVING  Do not drive for until you are seen for follow up and cleared by a provider.  If you have been told by your doctor in the past that you may not drive, you must talk with him/her before you begin driving again.   DRESSING  Groin site: you may leave the clear dressing over the site for up to one week or until it falls off.   HYGIENE  If you had a femoral (leg) procedure, you may take a shower when you return home. After the shower, pat the site dry. Do NOT use powder, oils or lotions in your groin area until the site has completely healed.  If you had a chest procedure, you may shower when you return home unless specifically instructed not to by your discharging practitioner.             - DO NOT scrub incision; pat dry with a towel             - DO NOT apply any lotions, oils, powders to the incision             - No tub baths / swimming for at least 2 weeks.  If you notice any fevers, chills, increased pain, swelling, bleeding or pus, please contact your doctor.  ADDITIONAL INFORMATION  If you are going to have an upcoming dental procedure, please contact our office as you will require antibiotics ahead of time to prevent infection on your heart valve.

## 2017-08-23 NOTE — Progress Notes (Signed)
1 Day Post-Op Procedure(s) (LRB): TRANSCATHETER AORTIC VALVE REPLACEMENT, TRANSFEMORAL (N/A) TRANSESOPHAGEAL ECHOCARDIOGRAM (TEE) (N/A) Subjective: No complaints  Objective: Vital signs in last 24 hours: Temp:  [96.2 F (35.7 C)-97.9 F (36.6 C)] 97.9 F (36.6 C) (12/12 0340) Pulse Rate:  [0-295] 66 (12/12 0400) Cardiac Rhythm: Normal sinus rhythm (12/12 0400) Resp:  [0-179] 24 (12/12 0500) BP: (79-181)/(49-84) 142/60 (12/12 0500) SpO2:  [0 %-100 %] 95 % (12/12 0400) Weight:  [76.8 kg (169 lb 6.4 oz)-79.4 kg (175 lb)] 76.8 kg (169 lb 6.4 oz) (12/12 0500)  Hemodynamic parameters for last 24 hours:    Intake/Output from previous day: 12/11 0701 - 12/12 0700 In: 2489.8 [P.O.:960; I.V.:1279.8; IV Piggyback:250] Out: 650 [Urine:550; Blood:100] Intake/Output this shift: Total I/O In: 1527.6 [P.O.:960; I.V.:317.6; IV Piggyback:250] Out: 550 [Urine:550]  General appearance: alert and cooperative Neurologic: intact Heart: regular rate and rhythm, S1, S2 normal, no murmur, click, rub or gallop Lungs: clear to auscultation bilaterally Extremities: extremities normal, atraumatic, no cyanosis or edema Wound: groin access sites ok  Lab Results: Recent Labs    08/22/17 1755 08/23/17 0411  WBC 9.4 8.8  HGB 11.6* 11.6*  HCT 35.5* 35.2*  PLT 117* 102*   BMET:  Recent Labs    08/22/17 1727 08/23/17 0411  NA 140 132*  K 4.3 4.2  CL 104 104  CO2  --  21*  GLUCOSE 201* 236*  BUN 21* 21*  CREATININE 0.80 0.97  CALCIUM  --  8.6*    PT/INR: No results for input(s): LABPROT, INR in the last 72 hours. ABG    Component Value Date/Time   PHART 7.409 08/17/2017 0852   HCO3 24.0 08/17/2017 0852   TCO2 24 08/22/2017 1727   ACIDBASEDEF 0.1 08/17/2017 0852   O2SAT 97.5 08/17/2017 0852   CBG (last 3)  Recent Labs    08/22/17 1049 08/22/17 1300 08/22/17 2355  GLUCAP 251* 202* 155*   ECG: sinus rhythm, no acute changes  Assessment/Plan: S/P Procedure(s)  (LRB): TRANSCATHETER AORTIC VALVE REPLACEMENT, TRANSFEMORAL (N/A) TRANSESOPHAGEAL ECHOCARDIOGRAM (TEE) (N/A)  POD 1 He is hemodynamically stable in sinus rhythm.  2D echo today DC arterial line and central line Continue ASA and Plavix Transfer to 6E and mobilize.   LOS: 1 day    Gaye Pollack 08/23/2017

## 2017-08-23 NOTE — Progress Notes (Signed)
[name] will remove this patients central line. Manuel Olson

## 2017-08-23 NOTE — Progress Notes (Signed)
CARDIAC REHAB PHASE I   PRE:  Rate/Rhythm: 81 SR  BP:  Supine: 166/62  Sitting:   Standing:    SaO2: 97%RA  MODE:  Ambulation: 1200 ft   POST:  Rate/Rhythm: 90 SR  BP:  Supine: 163/68  Sitting:   Standing: 96%RA 1405-1435 Pt walked 1200 ft independently with steady gait and tolerated well. Discussed CRP 2 and pt not interested at this time due to tentative chemo and radiation he will need in future. Will possibly do at a later time and will discuss with Dr Domenic Polite at that time.       Graylon Good, RN BSN  08/23/2017 2:30 PM

## 2017-08-24 ENCOUNTER — Encounter (HOSPITAL_COMMUNITY): Payer: Self-pay | Admitting: Physician Assistant

## 2017-08-24 DIAGNOSIS — I10 Essential (primary) hypertension: Secondary | ICD-10-CM | POA: Diagnosis present

## 2017-08-24 DIAGNOSIS — N2889 Other specified disorders of kidney and ureter: Secondary | ICD-10-CM | POA: Diagnosis present

## 2017-08-24 DIAGNOSIS — Z87891 Personal history of nicotine dependence: Secondary | ICD-10-CM

## 2017-08-24 DIAGNOSIS — C259 Malignant neoplasm of pancreas, unspecified: Secondary | ICD-10-CM | POA: Diagnosis present

## 2017-08-24 DIAGNOSIS — I2510 Atherosclerotic heart disease of native coronary artery without angina pectoris: Secondary | ICD-10-CM | POA: Diagnosis present

## 2017-08-24 LAB — GLUCOSE, CAPILLARY: Glucose-Capillary: 240 mg/dL — ABNORMAL HIGH (ref 65–99)

## 2017-08-24 MED ORDER — GLIPIZIDE 5 MG PO TABS: 5.0000 mg | ORAL_TABLET | ORAL | Status: DC

## 2017-08-24 NOTE — Progress Notes (Signed)
Discharge instructions (including medications) discussed with and copy provided to patient/caregiver 

## 2017-08-24 NOTE — Progress Notes (Signed)
[id number] Pt dressed and ready for discharge. Gave diabetic diet and briefly reviewed carb counting so pt would have some familiarity with  It.  He is going to see endocrinologist tomorrow.  Gave walking guidelines. Pt and wife voiced understanding. Graylon Good RN BSN 08/24/2017 10:11 AM

## 2017-08-24 NOTE — Progress Notes (Addendum)
Inpatient Diabetes Program Recommendations  AACE/ADA: New Consensus Statement on Inpatient Glycemic Control (2015)  Target Ranges:  Prepandial:   less than 140 mg/dL      Peak postprandial:   less than 180 mg/dL (1-2 hours)      Critically ill patients:  140 - 180 mg/dL  Results for SUHAS, ESTIS (MRN [id number]) as of 08/24/2017 07:44  Ref. Range 08/23/2017 09:01 08/23/2017 12:15 08/23/2017 16:48 08/23/2017 20:57 08/24/2017 06:21  Glucose-Capillary Latest Ref Range: 65 - 99 mg/dL 238 (H) 140 (H) 144 (H) 171 (H) 240 (H)    Review of Glycemic Control  Diabetes history: DM2 Outpatient Diabetes medications: Glipizide 5 mg QAM Current orders for Inpatient glycemic control: Glipizide 5 mg QAM, Novolog 0-9 units TID with meals, Novolog 0-5 units QHS  Inpatient Diabetes Program Recommendations:  Oral Agents: Noted fasting glucose is 240 mg/dl this morning. Please consider discharging patient on Glipizide 5 mg QAM with breakfast and Glipizide 2.5 mg QPM with supper.  HgbA1C: A1C 10.4% on 08/17/17 indicating an average glucose of 252 mg/dl over the past 2-3 months. Outpatient Referral: Patient has an initial visit with Dr. Dorris Fetch (Endocrinologist) on 08/25/17 at 8:15 am for assistance with improving glycemic control.  Addendum 08/24/17@9 :45-Talked with patient and his wife regarding DM control. Discussed discharge plan for DM control with adding Glipizide 2.5 mg QPM (and continue Glipizide 5 mg QAM). Asked patient to check his glucose at least 4 times per day and to be sure to take glucometer with him to appointment with Dr. Dorris Fetch in the morning. Inquired about hypoglycemia and patient reports that he has never had hypoglycemia that he can recall. Discussed hypoglycemia along with treatment and provided handout information regarding hypoglycemia. Patient and his wife expressed appreciation for the great care received during hospital admission and verbalized understanding of information  discussed.  Thanks, Barnie Alderman, RN, MSN, CDE Diabetes Coordinator Inpatient Diabetes Program [phone] (Team Pager from 8am to 5pm)

## 2017-08-24 NOTE — Care Management Note (Signed)
Case Management Note Marvetta Gibbons RN, BSN Unit 4E-Case Manager [phone]  Patient Details  Name: Manuel Olson MRN: [id number] Date of Birth: Oct 10, 1945  Subjective/Objective:  Pt admitted s/p TAVR                  Action/Plan: PTA pt lived at home with spouse-independent- plan to return home- no CM needs identified for transition back home-   Expected Discharge Date:  08/24/17               Expected Discharge Plan:  [place] [facility] Referral:  NA  Discharge planning Services  CM Consult  Post Acute Care Choice:  NA Choice offered to:     DME Arranged:    DME Agency:     HH Arranged:    [place] Agency:     Status of Service:  Completed, signed off  If discussed at [place] of Stay Meetings, dates discussed:    Discharge Disposition: home/self care   Additional Comments:  Dawayne Patricia, RN 08/24/2017, 10:15 AM

## 2017-08-24 NOTE — Discharge Summary (Signed)
[place] VALVE TEAM   Discharge Summary    Patient ID: Manuel Olson,  MRN: [id number], DOB/AGE: [birth year] 36 y.o.  Admit date: 08/22/2017 Discharge date: 08/24/2017  Primary Care Provider: [name] Primary Cardiologist: Dr Domenic Polite / Dr. Angelena Form & Dr. Cyndia Bent (TAVR)   Discharge Diagnoses    Principal Problem:   S/P TAVR (transcatheter aortic valve replacement) Active Problems:   Severe aortic stenosis   Cancer of head of pancreas [facility])   Essential hypertension   Pancreatic cancer ([place])   Renal mass   CAD (coronary artery disease)   Former smoker   Allergies No Known Allergies   History of Present Illness     Manuel Olson is a 71 y.o. male with a history of HTN, DMT2, previous heavy smoking, CAD: STEMI s/p multivessel stenting (11/2016), recently diagnosed pancreatic cancer and probable renal cell carcinoma and severe aortic stenosis who presented to [facility] on 08/22/17 for planned TAVR.  He suffered a STEMI while in [place] in 11/2016.  He was taken by his wife to [facility] and underwent emergent catheterization showing 70% stenosis in the mid LAD, occlusion of a large diagonal branch, 90% proximal right coronary stenosis, and 60-70% mid right coronary stenosis.  Left ventricular function was preserved.  He underwent PCI with drug-eluting stents in the mid LAD, diagonal branch, and proximal and mid right coronary artery.  He was told that he also had aortic stenosis and was to follow-up with a cardiologist as soon as he returned to [facility].  He reports a long history of a heart murmur which was never worked up.  Upon returning he was evaluated by Dr. Domenic Polite in April and underwent an echocardiogram showing severe aortic stenosis with a mean gradient of 49 mmHg and a dimensionless index of 0.18.  His ejection fraction was 55-60%.  He says that he was feeling fairly well at that time  and quit smoking.  He saw Dr. Domenic Polite again in early October and a repeat echocardiogram showed a mean aortic valve gradient of 43 mmHg with a dimensionless index of 0.22.  He was referred to Dr. Burt Knack for TAVR evaluation and underwent cardiac catheterization on 07/05/2017 which showed continued patency of the stented segments in his LAD, first diagonal, and right coronary artery.  There was mild diffuse nonobstructive residual coronary artery disease. The mean aortic valve gradient was measured at 33 mmHg with a peak to peak gradient of 42 mmHg.    After being seen by Dr. Burt Knack he underwent workup for TAVR.  His CT scan showed a mass in the head of his pancreas as well as a large left renal tumor.  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  He has been seen by Dr. Julieanne Manson as well as by one of the urologists.  He has been evaluated by Dr. Barry Dienes and the plan is for neoadjuvant chemotherapy and possibly radiotherapy with subsequent resection of the pancreatic mass as well as left nephrectomy.  After evaluation by our multidisciplinary valve team and discussion with his oncologist it was decided to proceed with TAVR with plans to continue treatment and surgical resection of pancreatic mass and nephrectomy. He was felt to have stage D, severe, symptomatic aortic stenosis with NYHA class IIb symptoms of exertional shortness of breath and fatigue consistent with chronic diastolic heart failure. His STS score was 2.572%. Although he  would be at low risk for open surgical aortic valve replacement it was felt that TAVR was a much better option for him since it would allow a much quicker recovery so that he can proceed with neoadjuvant treatment for his pancreatic carcinoma and hopefully subsequent resection of his pancreatic carcinoma and left kidney.  His aortic stenosis is severe and we did not think he would tolerate  surgical resection of his pancreas and left kidney without aortic valve replacement. TAVR was set up for 08/22/17.   Hospital Course     Consultants: none  Severe AS: s/p successful placement of a 26 mm Edwards Sapien 3 THV via the TF approach. Post operative echo shows normally functioning valve with no PVL: Mean gradient: 15 mm Hg. Peak gradient: 26 mm Hg. Groin sites are stable. ECG with NSR and no high grade heart block. Plan is for discharge on ASA and plavix. I will see him back in 1 week for TOC visit.   Uncontrolled DMT2: HgA1c 10.4. Pancreatic cancer may be contributing to elevated A1C. The diabetes coordinator has suggested that he be discharged on Glipizide 5 mg QAM with breakfast and Glipizide 2.5 mg QPM with supper. Patient has an initial visit with Dr. Dorris Fetch (Endocrinologist) on 08/25/17 at 8:15 am for assistance with improving glycemic control.  HTN: BP under moderate control on home medications  CAD: [place] and underwent multivessel stenting with DES. Will continue ASA/plavix, statin and BB.  Pancreatic cancer and probable RCC: he has a PET scan set up for 12/19.  He has been seen by Dr. Barry Dienes in surgery and Dr. Benay Spice in Oncology. Plans being made for chemotherapy and XRT with possibility of resection of his pancreatic mass and nephrectomy.    The patient has had an uncomplicated hospital course and is recovering well. The femoral catheter sites are stable.  Dr. Cyndia Bent has deemed him ready for discharge home. All follow-up appointments have been scheduled. Discharge medications are listed below.  _____________  Discharge Vitals Blood pressure 139/63, pulse 91, temperature 98 F (36.7 C), temperature source Oral, resp. rate 20, height 5' 10"  (1.778 m), weight 169 lb (76.7 kg), SpO2 94 %.  Filed Weights   08/22/17 1051 08/23/17 0500 08/24/17 0428  Weight: 175 lb (79.4 kg) 169 lb 6.4 oz (76.8 kg) 169 lb (76.7 kg)    General appearance:  alert and cooperative Neurologic: intact Heart: regular rate and rhythm, S1, S2 normal, no murmur, click, rub or gallop Lungs: clear to auscultation bilaterally Extremities: extremities normal, atraumatic, no cyanosis or edema Wound: groin access sites ok    Labs & Radiologic Studies     CBC Recent Labs    08/22/17 1755 08/22/17 1759 08/23/17 0411  WBC 9.4  --  8.8  HGB 11.6* 11.2* 11.6*  HCT 35.5* 33.0* 35.2*  MCV 91.0  --  91.9  PLT 117*  --  258*   Basic Metabolic Panel Recent Labs    08/22/17 1727 08/22/17 1759 08/23/17 0411  NA 140 140 132*  K 4.3 4.3 4.2  CL 104  --  104  CO2  --   --  21*  GLUCOSE 201* 194* 236*  BUN 21*  --  21*  CREATININE 0.80  --  0.97  CALCIUM  --   --  8.6*  MG  --   --  1.8   Liver Function Tests No results for input(s): AST, ALT, ALKPHOS, BILITOT, PROT, ALBUMIN in the last  72 hours. No results for input(s): LIPASE, AMYLASE in the last 72 hours. Cardiac Enzymes No results for input(s): CKTOTAL, CKMB, CKMBINDEX, TROPONINI in the last 72 hours. BNP Invalid input(s): POCBNP D-Dimer No results for input(s): DDIMER in the last 72 hours. Hemoglobin A1C No results for input(s): HGBA1C in the last 72 hours. Fasting Lipid Panel No results for input(s): CHOL, HDL, LDLCALC, TRIG, CHOLHDL, LDLDIRECT in the last 72 hours. Thyroid Function Tests No results for input(s): TSH, T4TOTAL, T3FREE, THYROIDAB in the last 72 hours.  Invalid input(s): FREET3  Dg Chest 2 View  Result Date: 08/17/2017 CLINICAL DATA:  Severe aortic stenosis. Preoperative evaluation for a transcatheter aortic valve replacement. EXAM: CHEST  2 VIEW COMPARISON:  06/26/2017 and chest CT 07/20/2017 FINDINGS: Stable dense nodule in the lateral right lung that probably represents a granuloma based on previous imaging. No airspace disease or pulmonary edema. Heart size is normal. There are coronary artery stents. Facet arthropathy in the lower cervical spine. Bridging  osteophytes throughout the thoracic spine. No large pleural effusions. IMPRESSION: No active cardiopulmonary disease. Electronically Signed   By: Markus Daft M.D.   On: 08/17/2017 09:37   Mr Jeri Cos TF Contrast  Result Date: 07/29/2017 CLINICAL DATA:  Pancreatic mass concerning for adenocarcinoma. Left renal mass. EXAM: MRI HEAD WITHOUT AND WITH CONTRAST TECHNIQUE: Multiplanar, multiecho pulse sequences of the brain and surrounding structures were obtained without and with intravenous contrast. CONTRAST:  39m MULTIHANCE GADOBENATE DIMEGLUMINE 529 MG/ML IV SOLN COMPARISON:  None. FINDINGS: Brain: The diffusion-weighted images demonstrate no acute or subacute infarction. There is no acute hemorrhage or mass lesion. Periventricular and subcortical white matter changes bilaterally are moderately advanced for age. There is no associated enhancement. No focal enhancing lesions are present. Ventricles are proportionate to the degree of atrophy. No significant extra-axial fluid collection is present. Vascular: Flow is present in the major intracranial arteries. Skull and upper cervical spine: The skullbase is within normal limits. The craniocervical junction is normal. Sinuses/Orbits: The paranasal sinuses and mastoid air cells are clear. Globes and orbits are within normal limits. IMPRESSION: 1. No evidence for metastatic disease to the brain or meninges. 2. The white matter disease is moderately advanced for age. This likely reflects the sequela of chronic microvascular ischemia. Electronically Signed   By: CSan MorelleM.D.   On: 07/29/2017 15:20   Mr Abdomen Wwo Contrast  Result Date: 07/28/2017 CLINICAL DATA:  Pancreatic lesion and left renal mass seen on recent CT scan. EXAM: MRI ABDOMEN WITHOUT AND WITH CONTRAST TECHNIQUE: Multiplanar multisequence MR imaging of the abdomen was performed both before and after the administration of intravenous contrast. CONTRAST:  110mMULTIHANCE GADOBENATE  DIMEGLUMINE 529 MG/ML IV SOLN COMPARISON:  CT scan 07/20/2017. FINDINGS: Lower chest:  Unremarkable. Hepatobiliary: No focal abnormality within the liver parenchyma. There is no evidence for gallstones, gallbladder wall thickening, or pericholecystic fluid. No intrahepatic or extrahepatic biliary dilation. Pancreas: As noted on the recent CT scan, a 3.3 x 3.5 cm ill-defined lesion is evident in the head/neck region of the pancreas. Main pancreatic duct abruptly terminates in this lesion and parenchyma in the body and tail of the pancreas is diffusely atrophic. Spleen: No splenomegaly. No focal mass lesion. Adrenals/Urinary Tract: No adrenal nodule or mass. 6 mm lesion lower pole right kidney is likely a cyst. 6.0 x 6.8 x 7.3 cm heterogeneously enhancing lesion in the upper pole of the left kidney shows central necrosis or cystic change. The lesion extends into the central sinus fat  medially and through the posterior capsule. The left renal vein is patent. Stomach/Bowel: Stomach is nondistended. No gastric wall thickening. No evidence of outlet obstruction. No small bowel or colonic dilatation within the visualized abdomen. Vascular/Lymphatic: No abdominal aortic aneurysm. Portal vein is patent. There is mass-effect on the portal splenic confluence by the pancreatic mass although the superior mesenteric vein and confluence appear patent. Splenic artery is attenuated centrally near the confluence. Celiac axis demonstrates well preserved perivascular fat plane as does the SMA with no abnormal soft tissue surrounding either vessel. Other: No intraperitoneal free fluid. Musculoskeletal: No abnormal marrow enhancement within the visualized bony anatomy. IMPRESSION: 1. 3 x 3 x 3.5 cm ill-defined lesion in the head/neck region of the pancreas with abrupt cut off of the main pancreatic duct and associated pancreatic parenchymal atrophy in the body and tail of pancreas. Imaging features highly suspicious for pancreatic  adenocarcinoma. Consider EUS for further evaluation. PET-CT may also prove helpful. 2. 6 x 7 x 7 cm heterogeneously enhancing mass upper pole left kidney compatible with renal cell carcinoma. 3. No evidence for metastatic disease in the abdomen. Electronically Signed   By: Misty Stanley M.D.   On: 07/28/2017 09:54   Mr 3d Recon At Scanner  Result Date: 07/28/2017 CLINICAL DATA:  Pancreatic lesion and left renal mass seen on recent CT scan. EXAM: MRI ABDOMEN WITHOUT AND WITH CONTRAST TECHNIQUE: Multiplanar multisequence MR imaging of the abdomen was performed both before and after the administration of intravenous contrast. CONTRAST:  54m MULTIHANCE GADOBENATE DIMEGLUMINE 529 MG/ML IV SOLN COMPARISON:  CT scan 07/20/2017. FINDINGS: Lower chest:  Unremarkable. Hepatobiliary: No focal abnormality within the liver parenchyma. There is no evidence for gallstones, gallbladder wall thickening, or pericholecystic fluid. No intrahepatic or extrahepatic biliary dilation. Pancreas: As noted on the recent CT scan, a 3.3 x 3.5 cm ill-defined lesion is evident in the head/neck region of the pancreas. Main pancreatic duct abruptly terminates in this lesion and parenchyma in the body and tail of the pancreas is diffusely atrophic. Spleen: No splenomegaly. No focal mass lesion. Adrenals/Urinary Tract: No adrenal nodule or mass. 6 mm lesion lower pole right kidney is likely a cyst. 6.0 x 6.8 x 7.3 cm heterogeneously enhancing lesion in the upper pole of the left kidney shows central necrosis or cystic change. The lesion extends into the central sinus fat medially and through the posterior capsule. The left renal vein is patent. Stomach/Bowel: Stomach is nondistended. No gastric wall thickening. No evidence of outlet obstruction. No small bowel or colonic dilatation within the visualized abdomen. Vascular/Lymphatic: No abdominal aortic aneurysm. Portal vein is patent. There is mass-effect on the portal splenic confluence by the  pancreatic mass although the superior mesenteric vein and confluence appear patent. Splenic artery is attenuated centrally near the confluence. Celiac axis demonstrates well preserved perivascular fat plane as does the SMA with no abnormal soft tissue surrounding either vessel. Other: No intraperitoneal free fluid. Musculoskeletal: No abnormal marrow enhancement within the visualized bony anatomy. IMPRESSION: 1. 3 x 3 x 3.5 cm ill-defined lesion in the head/neck region of the pancreas with abrupt cut off of the main pancreatic duct and associated pancreatic parenchymal atrophy in the body and tail of pancreas. Imaging features highly suspicious for pancreatic adenocarcinoma. Consider EUS for further evaluation. PET-CT may also prove helpful. 2. 6 x 7 x 7 cm heterogeneously enhancing mass upper pole left kidney compatible with renal cell carcinoma. 3. No evidence for metastatic disease in the abdomen. Electronically Signed  By: Misty Stanley M.D.   On: 07/28/2017 09:54   Dg Chest Port 1 View  Result Date: 08/22/2017 CLINICAL DATA:  Status post transcatheter aortic valve replacement EXAM: PORTABLE CHEST 1 VIEW COMPARISON:  08/17/2017 FINDINGS: Right IJ line with tip at the lower SVC. New aortic valve cage as expected. Coronary stents. Normal heart size. Stable mediastinal contours. The right diaphragm is newly elevated. There is a nodular density over the right lung, status post chest CT 07/20/2017. No edema, effusion, or pneumothorax. IMPRESSION: 1. Newly elevated right diaphragm. 2. Right IJ line in good position. Electronically Signed   By: Monte Fantasia M.D.   On: 08/22/2017 18:53     Diagnostic Studies/Procedures    [facility]  TAVR OPERATIVE NOTE   Date of Procedure:                08/22/2017  Preoperative Diagnosis:      Severe Aortic Stenosis  Procedure:        Transcatheter Aortic Valve Replacement - Transfemoral Approach             Edwards Sapien 3 THV (size 26  mm, model #9600CM26A , serial # [id number])              Co-Surgeons:                        Lauree Chandler, MD and Gaye Pollack, MD   Pre-operative Echo Findings: ? Severe aortic stenosis ? Normal left ventricular systolic function  Post-operative Echo Findings: ? No paravalvular leak  _____________   Post operative echo 08/23/17 Study Conclusions - Left ventricle: The cavity size was normal. Wall thickness was   increased in a pattern of mild LVH. Systolic function was normal.   The estimated ejection fraction was in the range of 55% to 60%.   Wall motion was normal; there were no regional wall motion   abnormalities. Doppler parameters are consistent with abnormal   left ventricular relaxation (grade 1 diastolic dysfunction). The   E/e&' ratio is between 8-15, suggesting normal LV filling   pressure. - Aortic valve: s/p TAVR valve No obstruction. No paravalvular   leak. Mean gradient (S): 15 mm Hg. Peak gradient (S): 26 mm Hg.   Valve area (VTI): 2.56 cm^2. Valve area (Vmax): 2.63 cm^2. Valve   area (Vmean): 2.22 cm^2. - Mitral valve: Calcified annulus. Mildly thickened leaflets .   There was trivial regurgitation. - Left atrium: The atrium was normal in size. - Systemic veins: The IVC measures >2.1 cm, but collapses >50%,   suggesting an elevated RA pressure of 8 mmHg. - Pericardium, extracardiac: There was no pericardial effusion. Impressions: - Compared to a prior study in 06/2017, a TAVR valve is noted   without obstruction or paravalvular leak - mean gradient of 15   mmHg is noted.   Disposition   Pt is being discharged home today in good condition.  Follow-up Plans & Appointments    Follow-up Information    Eileen Stanford, PA-C. Go on 08/31/2017.   Specialties:  Cardiology, Radiology Why:  @ 2:30pm Contact information: [address] [place] [address] [phone]        [facility]. Go  on 08/30/2017.   Specialty:  Radiology Why:  @ 8:00am for your PET scan. Nothing to eat after midnight the night before. No insulin within 4 hours of your scan. You can take your  glipizide.  Contact information: [address] [id number] ar [place]       Cassandria Anger, MD. Go on 08/25/2017.   Specialty:  Endocrinology Why:  @ 8:15am.  Contact information: [place] [address] [phone]          Discharge Instructions    Diet - low sodium heart healthy   Complete by:  As directed    Increase activity slowly   Complete by:  As directed       Discharge Medications     Medication List    TAKE these medications   aspirin EC 81 MG tablet Take 1 tablet (81 mg total) by mouth daily.   atorvastatin 20 MG tablet Commonly known as:  LIPITOR Take 20 mg by mouth daily.   carvedilol 3.125 MG tablet Commonly known as:  COREG Take 1 tablet (3.125 mg total) by mouth 2 (two) times daily with a meal.   clopidogrel 75 MG tablet Commonly known as:  PLAVIX Take 1 tablet (75 mg total) by mouth daily.   glipiZIDE 5 MG tablet Commonly known as:  GLUCOTROL Take 1 tablet (5 mg total) by mouth as directed. Take 57m (1 tab) in AM with breakfast and 2.550m(1/2 tab in evening with supper) What changed:    when to take this  additional instructions   lisinopril 10 MG tablet Commonly known as:  PRINIVIL,ZESTRIL Take 1 tablet (10 mg total) by mouth daily.         Outstanding Labs/Studies   None   Duration of Discharge Encounter   Greater than 30 minutes including physician time.  Signed, KaAngelena FormA-C 08/24/2017, 8:55 AM

## 2017-08-25 ENCOUNTER — Telehealth: Payer: Self-pay | Admitting: Physician Assistant

## 2017-08-25 ENCOUNTER — Encounter: Payer: Self-pay | Admitting: "Endocrinology

## 2017-08-25 ENCOUNTER — Ambulatory Visit (INDEPENDENT_AMBULATORY_CARE_PROVIDER_SITE_OTHER): Payer: Medicare Other | Admitting: "Endocrinology

## 2017-08-25 ENCOUNTER — Other Ambulatory Visit: Payer: Self-pay | Admitting: *Deleted

## 2017-08-25 VITALS — BP 145/83 | HR 80 | Ht 70.0 in | Wt 173.0 lb

## 2017-08-25 DIAGNOSIS — I10 Essential (primary) hypertension: Secondary | ICD-10-CM

## 2017-08-25 DIAGNOSIS — E782 Mixed hyperlipidemia: Secondary | ICD-10-CM | POA: Insufficient documentation

## 2017-08-25 DIAGNOSIS — E119 Type 2 diabetes mellitus without complications: Secondary | ICD-10-CM | POA: Insufficient documentation

## 2017-08-25 DIAGNOSIS — Z794 Long term (current) use of insulin: Secondary | ICD-10-CM | POA: Insufficient documentation

## 2017-08-25 DIAGNOSIS — E1159 Type 2 diabetes mellitus with other circulatory complications: Secondary | ICD-10-CM | POA: Diagnosis not present

## 2017-08-25 DIAGNOSIS — E1165 Type 2 diabetes mellitus with hyperglycemia: Secondary | ICD-10-CM

## 2017-08-25 DIAGNOSIS — I25119 Atherosclerotic heart disease of native coronary artery with unspecified angina pectoris: Secondary | ICD-10-CM

## 2017-08-25 MED ORDER — INSULIN PEN NEEDLE 31G X 8 MM MISC: 1.0000 | 3 refills | Status: DC

## 2017-08-25 MED ORDER — INSULIN DEGLUDEC 100 UNIT/ML ~~LOC~~ SOPN: 20.0000 [IU] | PEN_INJECTOR | Freq: Every day | SUBCUTANEOUS | 2 refills | Status: DC

## 2017-08-25 MED ORDER — ATORVASTATIN CALCIUM 20 MG PO TABS: 20.0000 mg | ORAL_TABLET | Freq: Every day | ORAL | 6 refills | Status: DC

## 2017-08-25 MED FILL — Magnesium Sulfate Inj 50%: INTRAMUSCULAR | Qty: 10 | Status: AC

## 2017-08-25 MED FILL — Potassium Chloride Inj 2 mEq/ML: INTRAVENOUS | Qty: 40 | Status: AC

## 2017-08-25 MED FILL — Phenylephrine HCl Inj 10 MG/ML: INTRAMUSCULAR | Qty: 2 | Status: AC

## 2017-08-25 MED FILL — Heparin Sodium (Porcine) Inj 1000 Unit/ML: INTRAMUSCULAR | Qty: 30 | Status: AC

## 2017-08-25 MED FILL — Sodium Chloride IV Soln 0.9%: INTRAVENOUS | Qty: 250 | Status: AC

## 2017-08-25 NOTE — Progress Notes (Signed)
Consult Note       08/25/2017, 12:51 PM   Subjective:    Patient ID: Manuel Olson, male    DOB: October 30, 1945.  Manuel Olson is being seen in consultation for management of currently uncontrolled symptomatic diabetes requested by  [name], MD.   Past Medical History:  Diagnosis Date  . Arthritis   . CAD (coronary artery disease)    a. 11/2016 in [place]: STEMI s/p DES to mid LAD, DES to diagonal, DES x 2 to proximal and mid RCA   . Essential hypertension   . Former smoker   . GERD (gastroesophageal reflux disease)   . Hyperlipidemia   . Pancreatic cancer ([place])   . Renal mass   . S/P TAVR (transcatheter aortic valve replacement)    a. 08/2017:  Edwards Sapien 3 THV (size 26 mm, model #9600CM26A , serial # [id number])  . Severe aortic stenosis    a. 08/2017: s/p TAVR by Dr. Angelena Form and Dr. Cyndia Bent.   . 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 Type 2 diabetes mellitus ([place])    Past Surgical History:  Procedure Laterality Date  . APPENDECTOMY    . EUS N/A 08/02/2017   Procedure: UPPER ENDOSCOPIC ULTRASOUND (EUS) RADIAL;  Surgeon: [name], MD;  Location: [facility];  Service: Endoscopy;  Laterality: N/A;  . EYE SURGERY Left   . HAND SURGERY Bilateral   . HERNIA REPAIR Right   . RIGHT/LEFT HEART CATH AND CORONARY ANGIOGRAPHY N/A 07/05/2017   Procedure: RIGHT/LEFT HEART CATH AND CORONARY ANGIOGRAPHY;  Surgeon: [name], MD;  Location: [place] [facility];  Service: Cardiovascular;  Laterality: N/A;  . SHOULDER ARTHROSCOPY WITH ROTATOR CUFF REPAIR Left   . TEE WITHOUT CARDIOVERSION N/A 08/22/2017   Procedure: TRANSESOPHAGEAL ECHOCARDIOGRAM (TEE);  Surgeon: Burnell Blanks, MD;  Location: [place];  Service: Open Heart Surgery;  Laterality: N/A;  . TRANSCATHETER AORTIC VALVE REPLACEMENT, TRANSFEMORAL N/A 08/22/2017   Procedure: TRANSCATHETER AORTIC VALVE REPLACEMENT,  TRANSFEMORAL;  Surgeon: Burnell Blanks, MD;  Location: [place];  Service: Open Heart Surgery;  Laterality: N/A;   Social History   Socioeconomic History  . Marital status: Married    Spouse name: None  . Number of children: None  . Years of education: None  . Highest education level: None  Social Needs  . Financial resource strain: None  . Food insecurity - worry: None  . Food insecurity - inability: None  . Transportation needs - medical: None  . Transportation needs - non-medical: None  Occupational History  . None  Tobacco Use  . Smoking status: Former Smoker    Packs/day: 1.00    Years: 57.00    Pack years: 57.00    Types: Cigarettes    Start date: 09/12/1958    Last attempt to quit: 04/12/2016    Years since quitting: 1.3  . Smokeless tobacco: Never Used  Substance and Sexual Activity  . Alcohol use: No  . Drug use: No  . Sexual activity: None  Other Topics Concern  . None  Social History Narrative  . None   Outpatient  Encounter Medications as of 08/25/2017  Medication Sig  . aspirin EC 81 MG tablet Take 1 tablet (81 mg total) by mouth daily.  . carvedilol (COREG) 3.125 MG tablet Take 1 tablet (3.125 mg total) by mouth 2 (two) times daily with a meal.  . clopidogrel (PLAVIX) 75 MG tablet Take 1 tablet (75 mg total) by mouth daily.  [name] lisinopril (PRINIVIL,ZESTRIL) 10 MG tablet Take 10 mg by mouth daily.  . [DISCONTINUED] atorvastatin (LIPITOR) 20 MG tablet Take 20 mg by mouth daily.  . [DISCONTINUED] glipiZIDE (GLUCOTROL) 5 MG tablet Take 1 tablet (5 mg total) by mouth as directed. Take 56m (1 tab) in AM with breakfast and 2.523m(1/2 tab in evening with supper)  . insulin degludec (TRESIBA FLEXTOUCH) 100 UNIT/ML SOPN FlexTouch Pen Inject 0.2 mLs (20 Units total) into the skin daily at 10 pm.  . Insulin Pen Needle (B-D ULTRAFINE III SHORT PEN) 31G X 8 MM MISC 1 each by Does not apply route as directed.  . Marland Kitchenisinopril (PRINIVIL,ZESTRIL) 10 MG tablet Take 1 tablet  (10 mg total) by mouth daily.   No facility-administered encounter medications on file as of 08/25/2017.     ALLERGIES: No Known Allergies  VACCINATION STATUS:  There is no immunization history on file for this patient.  Diabetes  He presents for his initial diabetic visit. He has type 2 diabetes mellitus. Onset time: Patient was diagnosed at approximate age of [age]. His disease course has been worsening (His most recent A1c of 10.4% on 08/17/2017 while he was being prepared for surgery for aortic stenosis, pancreatic cancer, and renal cell carcinoma.). There are no hypoglycemic associated symptoms. Pertinent negatives for hypoglycemia include no confusion, headaches, pallor or seizures. Associated symptoms include blurred vision, foot paresthesias, polydipsia and polyuria. Pertinent negatives for diabetes include no chest pain, no fatigue, no polyphagia and no weakness. There are no hypoglycemic complications. Symptoms are worsening. Diabetic complications include peripheral neuropathy. Risk factors for coronary artery disease include diabetes mellitus, dyslipidemia, hypertension, tobacco exposure, sedentary lifestyle and male sex. Current diabetic treatment includes oral agent (monotherapy) (He is currently taking glipizide 5 mg by mouth daily.). His weight is decreasing steadily. He is following a generally unhealthy diet. When asked about meal planning, he reported none. He has not had a previous visit with a dietitian. He never participates in exercise. (He did not bring any meter nor logs to review today. His most recent A1c was 10.4% from 08/17/2017. - He is advised to control diabetes as soon as possible to clear him for his planned surgeries for pancreatic cancer, renal cell cancer.) An ACE inhibitor/angiotensin II receptor blocker is being taken. He does not see a podiatrist.Eye exam is not current.  Hyperlipidemia  This is a chronic problem. The current episode started more than 1 year  ago. Exacerbating diseases include diabetes. Pertinent negatives include no chest pain, myalgias or shortness of breath. Current antihyperlipidemic treatment includes statins. Risk factors for coronary artery disease include diabetes mellitus, dyslipidemia, hypertension, male sex and a sedentary lifestyle.  Hypertension  This is a chronic problem. The current episode started more than 1 year ago. Associated symptoms include blurred vision. Pertinent negatives include no chest pain, headaches, neck pain, palpitations or shortness of breath. Risk factors for coronary artery disease include dyslipidemia, diabetes mellitus, male gender, sedentary lifestyle and smoking/tobacco exposure. Past treatments include ACE inhibitors.      Review of Systems  Constitutional: Negative for chills, fatigue, fever and unexpected weight change.  HENT: Negative  for dental problem, mouth sores and trouble swallowing.   Eyes: Positive for blurred vision. Negative for visual disturbance.  Respiratory: Negative for cough, choking, chest tightness, shortness of breath and wheezing.   Cardiovascular: Negative for chest pain, palpitations and leg swelling.  Gastrointestinal: Negative for abdominal distention, abdominal pain, constipation, diarrhea, nausea and vomiting.  Endocrine: Positive for polydipsia and polyuria. Negative for polyphagia.  Genitourinary: Negative for dysuria, flank pain, hematuria and urgency.  Musculoskeletal: Negative for back pain, gait problem, myalgias and neck pain.  Skin: Negative for pallor, rash and wound.  Neurological: Negative for seizures, syncope, weakness, numbness and headaches.  Psychiatric/Behavioral: Negative.  Negative for confusion and dysphoric mood.    Objective:    BP (!) 145/83   Pulse 80   Ht 5' 10"  (1.778 m)   Wt 173 lb (78.5 kg)   BMI 24.82 kg/m   Wt Readings from Last 3 Encounters:  08/25/17 173 lb (78.5 kg)  08/24/17 169 lb (76.7 kg)  08/21/17 175 lb (79.4 kg)      Physical Exam  Constitutional: He is oriented to person, place, and time. He appears well-developed and well-nourished. He is cooperative. No distress.  HENT:  Head: Normocephalic and atraumatic.  Eyes: EOM are normal.  Neck: Normal range of motion. Neck supple. No tracheal deviation present. No thyromegaly present.  Cardiovascular: Normal rate, S1 normal, S2 normal and normal heart sounds. Exam reveals no gallop.  No murmur heard. Pulses:      Dorsalis pedis pulses are 1+ on the right side, and 1+ on the left side.       Posterior tibial pulses are 1+ on the right side, and 1+ on the left side.  Pulmonary/Chest: Breath sounds normal. No respiratory distress. He has no wheezes.  Abdominal: Soft. Bowel sounds are normal. He exhibits no distension. There is no tenderness. There is no guarding and no CVA tenderness.  Musculoskeletal: He exhibits no edema.       Right shoulder: He exhibits no swelling and no deformity.  Neurological: He is alert and oriented to person, place, and time. He has normal strength and normal reflexes. No cranial nerve deficit or sensory deficit. Gait normal.  Skin: Skin is warm and dry. No rash noted. No cyanosis. Nails show no clubbing.  Psychiatric: He has a normal mood and affect. His speech is normal and behavior is normal. Judgment and thought content normal. Cognition and memory are normal.      CMP ( most recent) CMP     Component Value Date/Time   NA 132 (L) 08/23/2017 0411   K 4.2 08/23/2017 0411   CL 104 08/23/2017 0411   CO2 21 (L) 08/23/2017 0411   GLUCOSE 236 (H) 08/23/2017 0411   BUN 21 (H) 08/23/2017 0411   CREATININE 0.97 08/23/2017 0411   CALCIUM 8.6 (L) 08/23/2017 0411   PROT 6.1 (L) 08/17/2017 0852   ALBUMIN 3.8 08/17/2017 0852   AST 15 08/17/2017 0852   ALT 11 (L) 08/17/2017 0852   ALKPHOS 79 08/17/2017 0852   BILITOT 0.6 08/17/2017 0852   GFRNONAA >60 08/23/2017 0411   GFRAA >60 08/23/2017 0411     Diabetic Labs (most  recent): Lab Results  Component Value Date   HGBA1C 10.4 (H) 08/17/2017     Lipid Panel ( most recent) Lipid Panel     Component Value Date/Time   CHOL 102 03/16/2017 0840   TRIG 71 03/16/2017 0840   HDL 36 (L) 03/16/2017 0840   CHOLHDL 2.8  03/16/2017 0840   VLDL 14 03/16/2017 0840   LDLCALC 52 03/16/2017 0840      Assessment & Plan:   1. DM type 2 causing vascular disease ([place])  - [name] has currently uncontrolled symptomatic type 2 DM since  71 years of age,  with most recent A1c of 10.4 %. Recent labs reviewed.  -his diabetes is complicated by pancreatic cancer, renal cell carcinoma, being prepared for surgery  and KIPPY GOHMAN remains at a high risk for more acute and chronic complications which include CAD, CVA, CKD, retinopathy, and neuropathy. These are all discussed in detail with the patient.  - I have counseled him on diet management by adopting a carbohydrate restricted/protein rich diet.  - Suggestion is made for him to avoid simple carbohydrates  from his diet including Cakes, Sweet Desserts, Ice Cream, Soda (diet and regular), Sweet Tea, Candies, Chips, Cookies, Store Bought Juices, Alcohol in Excess of  1-2 drinks a day, Artificial Sweeteners, and "Sugar-free" Products. This will help patient to have stable blood glucose profile and potentially avoid unintended weight gain.  - I encouraged him to switch to  unprocessed or minimally processed complex starch and increased protein intake (animal or plant source), fruits, and vegetables.  - he is advised to stick to a routine mealtimes to eat 3 meals  a day and avoid unnecessary snacks ( to snack only to correct hypoglycemia).   - he will be scheduled with Jearld Fenton, RDN, CDE for individualized diabetes education.  - I have approached him with the following individualized plan to manage diabetes and patient agrees:   - He is being prepared for surgery for pancreatic  and renal cancer , and  wishes to control diabetes as soon as possible. - He may require intensive Basal/bolus insulin to achieve that. -  I  will proceed to initiate  strict monitoring of glucose 4 times a day-before meals and at bedtime. - I discussed and initiated long acting insulin Tresiba 20 units daily at bedtime. - Patient is warned not to take insulin without proper monitoring per orders.  -Patient is encouraged to call clinic for blood glucose levels less than 70 or above 300 mg /dl. - I will discontinue glipizide, risk outweighs benefit for this patient. - he is not a suitable candidate for for incretin therapy .  - Patient specific target  A1c;  LDL, HDL, Triglycerides, and  Waist Circumference were discussed in detail.  2) BP/HTN: uncontrolled. Continue current medications including ACEI/ARB. 3) Lipids/HPL: controlled.   Patient is advised to continue statins. 4)  Weight/Diet: CDE Consult will be initiated , exercise, and detailed carbohydrates information provided.  5) Chronic Care/Health Maintenance:  -he  is on ACEI/ARB and Statin medications and  is encouraged to continue to follow up with Ophthalmology, Dentist,  Podiatrist at least yearly or according to recommendations, and advised to  stay away from smoking ( he has 70+ pack year smoking history). I have recommended yearly flu vaccine and pneumonia vaccination at least every 5 years; moderate intensity exercise for up to 150 minutes weekly; and  sleep for at least 7 hours a day.  - I advised patient to maintain close follow up with [name], MD for primary care needs.  - Time spent with the patient: 1 hour, of which >50% was spent in obtaining information about his symptoms, reviewing his previous labs, evaluations, and treatments, counseling him about his currently uncontrolled type 2 diabetes complicated by pancreatic and renal cell  carcinoma being prepared for surgery, hypertension, hyperlipidemia, and developing a plan for long term  treatment; his  questions were answered to his satisfaction.  Follow up plan: - Return in about 1 week (around 09/01/2017) for follow up with meter and logs- no labs.  Glade Lloyd, MD [facility] [address] [place], [place] [address] Phone: [phone]  Fax: [phone]    08/25/2017, 12:51 PM  This note was partially dictated with voice recognition software. Similar sounding words can be transcribed inadequately or may not  be corrected upon review.

## 2017-08-25 NOTE — Patient Instructions (Signed)

## 2017-08-25 NOTE — Telephone Encounter (Signed)
  [place] VALVE TEAM   Patient contacted regarding discharge from [facility] on 08/24/17.  Patient understands to follow up with provider, Angelena Form on 08/31/17 at 2:30pm at [place].  Patient understands discharge instructions? yes Patient understands medications and regiment? yes Patient understands to bring all medications to this visit? yes  Angelena Form PA-C  [facility]

## 2017-08-28 ENCOUNTER — Telehealth: Payer: Self-pay | Admitting: "Endocrinology

## 2017-08-28 NOTE — Telephone Encounter (Signed)
It will work,  He just needs a higher dose. Advise to increase Tresiba to 40 units qhs starting tonight. Call back if readings are >200 x 3.

## 2017-08-28 NOTE — Telephone Encounter (Signed)
Manuel Olson is stating that the insulin that was prescribed to him is not bringing down his blood sugars  Fri Dec 14 brk [place] Bedtime 221  Sat Dec 15 [place] Bedtime 326  Sun Dec 16 Brk [place] Bedtime 294  Mon Dec 17 Brk 221  Please advise he is having a Pet Scan Wednesday and his blood sugars have to be under 200

## 2017-08-28 NOTE — Telephone Encounter (Signed)
Patient is aware of recommendations

## 2017-08-30 ENCOUNTER — Ambulatory Visit
Admission: RE | Admit: 2017-08-30 | Discharge: 2017-08-30 | Disposition: A | Discharge status: Home / Self Care | Payer: Medicare Other | Source: Ambulatory Visit | Attending: Thoracic Surgery (Cardiothoracic Vascular Surgery) | Admitting: Thoracic Surgery (Cardiothoracic Vascular Surgery)

## 2017-08-30 DIAGNOSIS — N2889 Other specified disorders of kidney and ureter: Secondary | ICD-10-CM | POA: Insufficient documentation

## 2017-08-30 DIAGNOSIS — C259 Malignant neoplasm of pancreas, unspecified: Secondary | ICD-10-CM | POA: Diagnosis not present

## 2017-08-30 DIAGNOSIS — R911 Solitary pulmonary nodule: Secondary | ICD-10-CM | POA: Insufficient documentation

## 2017-08-30 DIAGNOSIS — I77819 Aortic ectasia, unspecified site: Secondary | ICD-10-CM | POA: Insufficient documentation

## 2017-08-30 DIAGNOSIS — I700 Atherosclerosis of aorta: Secondary | ICD-10-CM | POA: Insufficient documentation

## 2017-08-30 DIAGNOSIS — I723 Aneurysm of iliac artery: Secondary | ICD-10-CM | POA: Insufficient documentation

## 2017-08-30 DIAGNOSIS — I2510 Atherosclerotic heart disease of native coronary artery without angina pectoris: Secondary | ICD-10-CM | POA: Diagnosis not present

## 2017-08-30 LAB — GLUCOSE, CAPILLARY: Glucose-Capillary: 161 mg/dL — ABNORMAL HIGH (ref 65–99)

## 2017-08-30 MED ORDER — FLUDEOXYGLUCOSE F - 18 (FDG) INJECTION
11.8700 | Freq: Once | INTRAVENOUS | Status: AC | PRN
  Administered 2017-08-30: 11.87 via INTRAVENOUS

## 2017-08-31 ENCOUNTER — Ambulatory Visit (INDEPENDENT_AMBULATORY_CARE_PROVIDER_SITE_OTHER): Payer: Medicare Other | Admitting: Physician Assistant

## 2017-08-31 ENCOUNTER — Encounter: Payer: Self-pay | Admitting: Physician Assistant

## 2017-08-31 VITALS — BP 132/76 | HR 71 | Ht 70.0 in | Wt 171.6 lb

## 2017-08-31 DIAGNOSIS — I2510 Atherosclerotic heart disease of native coronary artery without angina pectoris: Secondary | ICD-10-CM

## 2017-08-31 DIAGNOSIS — Z952 Presence of prosthetic heart valve: Secondary | ICD-10-CM

## 2017-08-31 DIAGNOSIS — I10 Essential (primary) hypertension: Secondary | ICD-10-CM

## 2017-08-31 DIAGNOSIS — C259 Malignant neoplasm of pancreas, unspecified: Secondary | ICD-10-CM

## 2017-08-31 DIAGNOSIS — E118 Type 2 diabetes mellitus with unspecified complications: Secondary | ICD-10-CM

## 2017-08-31 NOTE — Patient Instructions (Signed)
Medication Instructions:  Your provider recommends that you continue on your current medications as directed. Please refer to the Current Medication list given to you today.    Labwork: None  Testing/Procedures: None  Follow-Up: Please keep your already scheduled appointments!  Any Other Special Instructions Will Be Listed Below (If Applicable).     If you need a refill on your cardiac medications before your next appointment, please call your pharmacy.

## 2017-08-31 NOTE — Progress Notes (Signed)
[facility]                                       Cardiology Office Note    Date:  09/01/2017   ID:  Manuel Olson, DOB 07/05/1946, MRN [id number]  PCP:  Satira Sark, MD  Cardiologist: Dr Domenic Polite / Dr. Angelena Form & Dr. Cyndia Bent (TAVR)  CC: [facility] visit s/p TAVR   History of Present Illness:  Manuel Olson is a 71 y.o. male with a history of HTN, DMT2, previous heavy smoking, CAD: STEMI s/p multivessel stenting (11/2016), recently diagnosed pancreatic cancer and probable renal cell carcinoma and severe aortic stenosis s/p TAVR who presents to clinic for follow up.   He suffered a STEMI while in [place] in 11/2016. He was taken by his wife to [facility] and underwent emergent catheterization showing 70% stenosis in the mid LAD, occlusion of a large diagonal branch, 90% proximal right coronary stenosis, and 60-70% mid right coronary stenosis. Left ventricular function was preserved. He underwent PCI with drug-eluting stents in the mid LAD, diagonal branch, and proximal and mid right coronary artery. He was told that he also had aortic stenosis and was to follow-up with a cardiologist as soon as he returned to [facility]. He reports a long history of a heart murmur which was never worked up. Upon returning he was evaluated by Dr. Leavy Cella April and underwent an echocardiogram showing severe aortic stenosis with a mean gradient of 49 mmHg and a dimensionless index of 0.18. His ejection fraction was 55-60%. He says that he was feeling fairly well at that time and quit smoking. He saw Dr. Domenic Polite again in early October and a repeat echocardiogram showed a mean aortic valve gradient of 43 mmHg with a dimensionless index of 0.22.  He was referred to Dr. Burt Knack for TAVR evaluation and underwent cardiac catheterization on 07/05/2017 which showed continued patency of the stented segments in his  LAD, first diagonal, and right coronary artery. There was mild diffuse nonobstructive residual coronary artery disease. The mean aortic valve gradient was measured at 33 mmHg with a peak to peak gradient of 42 mmHg.   After being seen by Dr. Burt Knack he underwent workup forTAVR. His CT scan showed a mass in the head of his pancreas as well as a large left renal tumor. 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 He has been seen by Dr. Roderick Pee well as by one of the urologists. He has been evaluated by Dr. Miles Costain the plan is for neoadjuvant chemotherapy and possibly radiotherapy with subsequent resection of the pancreatic mass as well as left nephrectomy.  After evaluation by our multidisciplinary valve team and discussion with his oncologist it was decided to proceed with TAVR with plans to continue treatment and surgical resection of pancreatic mass and nephrectomy. He was felt to have stage D, severe, symptomatic aortic stenosis with NYHA class IIb symptoms of exertional shortness of breath and fatigue consistent with chronic diastolic heart failure. His STS score was 2.572%. Although he would be at low risk for open surgical aortic valve replacement it was felt that TAVRwas a much better option for him since it would allow a much quicker recovery so that he can proceed with neoadjuvant treatment for his pancreatic carcinoma and  hopefully subsequent resection of his pancreatic carcinoma and left kidney. His aortic stenosis is severe and we did not think he would tolerate surgical resection of his pancreas and left kidney without aortic valve replacement. TAVR was set up for 08/22/17.  He underwent successful placement of a 26 mm Edwards Sapien 3 THV via the TF approach on 08/22/17. Post operative echo showed a normally functioning valve with no PVL: Mean gradient: 15 mm Hg. Peak gradient: 26 mm Hg. He  was discharged home on ASA and plavix.  Today he presents to clinic for follow up. He has been doing great and feels so much better. He feels like a weight has been lifted off his chest. No CP or SOB. No LE edema, orthopnea or PND. No dizziness or syncope. No blood in stool or urine. No palpitations.    Past Medical History:  Diagnosis Date  . Arthritis   . CAD (coronary artery disease)    a. 11/2016 in [place]: STEMI s/p DES to mid LAD, DES to diagonal, DES x 2 to proximal and mid RCA   . Essential hypertension   . Former smoker   . GERD (gastroesophageal reflux disease)   . Hyperlipidemia   . Pancreatic cancer ([place])   . Renal mass   . S/P TAVR (transcatheter aortic valve replacement)    a. 08/2017:  Edwards Sapien 3 THV (size 26 mm, model #9600CM26A , serial # [id number])  . Severe aortic stenosis    a. 08/2017: s/p TAVR by Dr. Angelena Form and Dr. Cyndia Bent.   . 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 Type 2 diabetes mellitus ([place])     Past Surgical History:  Procedure Laterality Date  . APPENDECTOMY    . EUS N/A 08/02/2017   Procedure: UPPER ENDOSCOPIC ULTRASOUND (EUS) RADIAL;  Surgeon: [name], MD;  Location: [facility];  Service: Endoscopy;  Laterality: N/A;  . EYE SURGERY Left   . HAND SURGERY Bilateral   . HERNIA REPAIR Right   . RIGHT/LEFT HEART CATH AND CORONARY ANGIOGRAPHY N/A 07/05/2017   Procedure: RIGHT/LEFT HEART CATH AND CORONARY ANGIOGRAPHY;  Surgeon: [name], MD;  Location: [place] [facility];  Service: Cardiovascular;  Laterality: N/A;  . SHOULDER ARTHROSCOPY WITH ROTATOR CUFF REPAIR Left   . TEE WITHOUT CARDIOVERSION N/A 08/22/2017   Procedure: TRANSESOPHAGEAL ECHOCARDIOGRAM (TEE);  Surgeon: Burnell Blanks, MD;  Location: [place];  Service: Open Heart Surgery;  Laterality: N/A;  . TRANSCATHETER AORTIC VALVE REPLACEMENT, TRANSFEMORAL N/A 08/22/2017   Procedure: TRANSCATHETER AORTIC VALVE REPLACEMENT, TRANSFEMORAL;  Surgeon: Burnell Blanks,  MD;  Location: [place];  Service: Open Heart Surgery;  Laterality: N/A;    Current Medications: Outpatient Medications Prior to Visit  Medication Sig Dispense Refill  . aspirin EC 81 MG tablet Take 1 tablet (81 mg total) by mouth daily. 90 tablet 3  . atorvastatin (LIPITOR) 20 MG tablet Take 1 tablet (20 mg total) by mouth daily. 30 tablet 6  . carvedilol (COREG) 3.125 MG tablet Take 1 tablet (3.125 mg total) by mouth 2 (two) times daily with a meal. 60 tablet 6  . clopidogrel (PLAVIX) 75 MG tablet Take 1 tablet (75 mg total) by mouth daily. 90 tablet 0  . insulin degludec (TRESIBA) 100 UNIT/ML SOPN FlexTouch Pen Inject 40 Units into the skin daily at 10 pm.    . Insulin Pen Needle (B-D ULTRAFINE III SHORT PEN) 31G X 8 MM MISC 1 each by Does not apply route as  directed. 100 each 3  . lisinopril (PRINIVIL,ZESTRIL) 10 MG tablet Take 1 tablet (10 mg total) by mouth daily. 30 tablet 6  . insulin degludec (TRESIBA FLEXTOUCH) 100 UNIT/ML SOPN FlexTouch Pen Inject 0.2 mLs (20 Units total) into the skin daily at 10 pm. (Patient not taking: Reported on 08/31/2017) 5 pen 2  . lisinopril (PRINIVIL,ZESTRIL) 10 MG tablet Take 10 mg by mouth daily.     No facility-administered medications prior to visit.      Allergies:   Patient has no known allergies.   Social History   Socioeconomic History  . Marital status: Married    Spouse name: None  . Number of children: None  . Years of education: None  . Highest education level: None  Social Needs  . Financial resource strain: None  . Food insecurity - worry: None  . Food insecurity - inability: None  . Transportation needs - medical: None  . Transportation needs - non-medical: None  Occupational History  . None  Tobacco Use  . Smoking status: Former Smoker    Packs/day: 1.00    Years: 57.00    Pack years: 57.00    Types: Cigarettes    Start date: 09/12/1958    Last attempt to quit: 04/12/2016    Years since quitting: 1.3  . Smokeless tobacco:  Never Used  Substance and Sexual Activity  . Alcohol use: No  . Drug use: No  . Sexual activity: None  Other Topics Concern  . None  Social History Narrative  . None     Family History:  The patient's *family history includes CAD in his brother; Hypertension in his brother; Lupus in his mother.      ROS:   Please see the history of present illness.    ROS All other systems reviewed and are negative.   PHYSICAL EXAM:   VS:  BP 132/76   Pulse 71   Ht 5' 10"  (1.778 m)   Wt 171 lb 9.6 oz (77.8 kg)   SpO2 98%   BMI 24.62 kg/m    GEN: Well nourished, well developed, in no acute distress  HEENT: normal  Neck: no JVD, carotid bruits, or masses Cardiac: RRR; no murmurs, rubs, or gallops,no edema  Respiratory:  clear to auscultation bilaterally, normal work of breathing GI: soft, nontender, nondistended, + BS MS: no deformity or atrophy  Skin: warm and dry, no rash. Groin sites healing well.  Neuro:  Alert and Oriented x 3, Strength and sensation are intact Psych: euthymic mood, full affect   Wt Readings from Last 3 Encounters:  09/01/17 171 lb 8 oz (77.8 kg)  08/31/17 171 lb 9.6 oz (77.8 kg)  08/25/17 173 lb (78.5 kg)      Studies/Labs Reviewed:   EKG:  EKG is ordered today.  The ekg ordered today demonstrates sinus with 1st degree AV block, HR 71  Recent Labs: 08/17/2017: ALT 11; B Natriuretic Peptide 163.2 08/23/2017: BUN 21; Creatinine, Ser 0.97; Hemoglobin 11.6; Magnesium 1.8; Platelets 102; Potassium 4.2; Sodium 132   Lipid Panel    Component Value Date/Time   CHOL 102 03/16/2017 0840   TRIG 71 03/16/2017 0840   HDL 36 (L) 03/16/2017 0840   CHOLHDL 2.8 03/16/2017 0840   VLDL 14 03/16/2017 0840   LDLCALC 52 03/16/2017 0840    Additional studies/ records that were reviewed today include:   [facility] TAVR OPERATIVE NOTE   Date of Procedure:08/22/2017  Preoperative Diagnosis:Severe Aortic Stenosis  Procedure:   Transcatheter Aortic Valve Replacement - Transfemoral Approach Edwards Sapien 3 THV (size 38m, model #[id number] serial # [id number]  Co-Surgeons:Christopher MAngelena Form MD and BGaye Pollack MD   Pre-operative Echo Findings: ? Severe aortic stenosis ? Normalleft ventricular systolic function  Post-operative Echo Findings: ? Noparavalvular leak  _____________   Post operative echo 08/23/17 Study Conclusions - Left ventricle: The cavity size was normal. Wall thickness was increased in a pattern of mild LVH. Systolic function was normal. The estimated ejection fraction was in the range of 55% to 60%. Wall motion was normal; there were no regional wall motion abnormalities. Doppler parameters are consistent with abnormal left ventricular relaxation (grade 1 diastolic dysfunction). The E/e&' ratio is between 8-15, suggesting normal LV filling pressure. - Aortic valve: s/p TAVR valve No obstruction. No paravalvular leak. Mean gradient (S): 15 mm Hg. Peak gradient (S): 26 mm Hg. Valve area (VTI): 2.56 cm^2. Valve area (Vmax): 2.63 cm^2. Valve area (Vmean): 2.22 cm^2. - Mitral valve: Calcified annulus. Mildly thickened leaflets . There was trivial regurgitation. - Left atrium: The atrium was normal in size. - Systemic veins: The IVC measures >2.1 cm, but collapses >50%, suggesting an elevated RA pressure of 8 mmHg. - Pericardium, extracardiac: There was no pericardial effusion. Impressions: - Compared to a prior study in 06/2017, a TAVR valve is noted without obstruction or paravalvular leak - mean gradient of 15 mmHg is noted.   ASSESSMENT & PLAN:   Severe AS s/p TAVR: He is doing great. Feeling much improved. ECG with sinus and 1st deg AV block. Groin sites stable. Cleared to drive and return to normal activities. Continue ASA and plavix. I will see back at 1 month for an echo.    Uncontrolled DMT2: HgA1c 10.4. Pancreatic cancer may be contributing to elevated A1C. He is now followed by Dr. NDorris Fetchwho is managing his diabetic regimen.   HTN: BP well controlled today  CAD: [place] and underwent multivessel stenting with DES. Will continue ASA/plavix, statin and BB.  Pancreatic cancer and probable RCC: PET scan 12/19 showed no evidence of hypermetabolic metastatic disease. He is followed by Dr. BBarry Dienesin surgery and Dr. SBenay Spicein Oncology. Plans being made for chemotherapy and XRT with possibility of resection of his pancreatic mass and nephrectomy.     Medication Adjustments/Labs and Tests Ordered: Current medicines are reviewed at length with the patient today.  Concerns regarding medicines are outlined above.  Medication changes, Labs and Tests ordered today are listed in the Patient Instructions below. Patient Instructions  Medication Instructions:  Your provider recommends that you continue on your current medications as directed. Please refer to the Current Medication list given to you today.    Labwork: None  Testing/Procedures: None  Follow-Up: Please keep your already scheduled appointments!  Any Other Special Instructions Will Be Listed Below (If Applicable).     If you need a refill on your cardiac medications before your next appointment, please call your pharmacy.      Signed, KAngelena Form PA-C  09/01/2017 10:05 AM    [facility] [address] [place]  [id number]: [phone] Fax: [phone]

## 2017-09-01 ENCOUNTER — Ambulatory Visit (INDEPENDENT_AMBULATORY_CARE_PROVIDER_SITE_OTHER): Payer: Medicare Other | Admitting: "Endocrinology

## 2017-09-01 ENCOUNTER — Ambulatory Visit (HOSPITAL_BASED_OUTPATIENT_CLINIC_OR_DEPARTMENT_OTHER): Payer: Medicare Other | Admitting: Oncology

## 2017-09-01 ENCOUNTER — Other Ambulatory Visit: Payer: Self-pay

## 2017-09-01 ENCOUNTER — Encounter: Payer: Self-pay | Admitting: "Endocrinology

## 2017-09-01 ENCOUNTER — Telehealth: Payer: Self-pay | Admitting: Oncology

## 2017-09-01 VITALS — BP 159/82 | HR 76 | Ht 70.0 in | Wt 171.0 lb

## 2017-09-01 VITALS — BP 174/78 | HR 69 | Temp 97.8°F | Resp 18 | Ht 70.0 in | Wt 171.5 lb

## 2017-09-01 DIAGNOSIS — I25119 Atherosclerotic heart disease of native coronary artery with unspecified angina pectoris: Secondary | ICD-10-CM

## 2017-09-01 DIAGNOSIS — Z87891 Personal history of nicotine dependence: Secondary | ICD-10-CM

## 2017-09-01 DIAGNOSIS — Z952 Presence of prosthetic heart valve: Secondary | ICD-10-CM

## 2017-09-01 DIAGNOSIS — C258 Malignant neoplasm of overlapping sites of pancreas: Secondary | ICD-10-CM | POA: Diagnosis not present

## 2017-09-01 DIAGNOSIS — I10 Essential (primary) hypertension: Secondary | ICD-10-CM

## 2017-09-01 DIAGNOSIS — I350 Nonrheumatic aortic (valve) stenosis: Secondary | ICD-10-CM | POA: Diagnosis not present

## 2017-09-01 DIAGNOSIS — E1159 Type 2 diabetes mellitus with other circulatory complications: Secondary | ICD-10-CM | POA: Diagnosis not present

## 2017-09-01 DIAGNOSIS — C259 Malignant neoplasm of pancreas, unspecified: Secondary | ICD-10-CM

## 2017-09-01 DIAGNOSIS — E119 Type 2 diabetes mellitus without complications: Secondary | ICD-10-CM

## 2017-09-01 DIAGNOSIS — E782 Mixed hyperlipidemia: Secondary | ICD-10-CM

## 2017-09-01 DIAGNOSIS — C642 Malignant neoplasm of left kidney, except renal pelvis: Secondary | ICD-10-CM

## 2017-09-01 MED ORDER — INSULIN DEGLUDEC 100 UNIT/ML ~~LOC~~ SOPN: 40.0000 [IU] | PEN_INJECTOR | Freq: Every day | SUBCUTANEOUS | 2 refills | Status: DC

## 2017-09-01 MED ORDER — ONETOUCH VERIO W/DEVICE KIT: 1.0000 | PACK | 0 refills | Status: DC | PRN

## 2017-09-01 MED ORDER — GLUCOSE BLOOD VI STRP: ORAL_STRIP | 2 refills | Status: DC

## 2017-09-01 MED ORDER — INSULIN GLARGINE 300 UNIT/ML ~~LOC~~ SOPN: 40.0000 [IU] | PEN_INJECTOR | Freq: Every day | SUBCUTANEOUS | 2 refills | Status: DC

## 2017-09-01 MED ORDER — INSULIN ASPART 100 UNIT/ML FLEXPEN: 10.0000 [IU] | PEN_INJECTOR | Freq: Three times a day (TID) | SUBCUTANEOUS | 2 refills | Status: DC

## 2017-09-01 NOTE — Progress Notes (Signed)
Consult Note       09/01/2017, 12:38 PM   Subjective:    Patient ID: Manuel Olson, male    DOB: 11-30-1945.  Manuel Olson is being seen in consultation for management of currently uncontrolled symptomatic diabetes requested by  Satira Sark, MD.   Past Medical History:  Diagnosis Date  . Arthritis   . CAD (coronary artery disease)    a. 11/2016 in [place]: STEMI s/p DES to mid LAD, DES to diagonal, DES x 2 to proximal and mid RCA   . Essential hypertension   . Former smoker   . GERD (gastroesophageal reflux disease)   . Hyperlipidemia   . Pancreatic cancer ([place])   . Renal mass   . S/P TAVR (transcatheter aortic valve replacement)    a. 08/2017:  Edwards Sapien 3 THV (size 26 mm, model #9600CM26A , serial # [id number])  . Severe aortic stenosis    a. 08/2017: s/p TAVR by Dr. Angelena Form and Dr. Cyndia Bent.   . 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 Type 2 diabetes mellitus ([place])    Past Surgical History:  Procedure Laterality Date  . APPENDECTOMY    . EUS N/A 08/02/2017   Procedure: UPPER ENDOSCOPIC ULTRASOUND (EUS) RADIAL;  Surgeon: [name], MD;  Location: [facility];  Service: Endoscopy;  Laterality: N/A;  . EYE SURGERY Left   . HAND SURGERY Bilateral   . HERNIA REPAIR Right   . RIGHT/LEFT HEART CATH AND CORONARY ANGIOGRAPHY N/A 07/05/2017   Procedure: RIGHT/LEFT HEART CATH AND CORONARY ANGIOGRAPHY;  Surgeon: [name], MD;  Location: [place] CV [facility];  Service: Cardiovascular;  Laterality: N/A;  . SHOULDER ARTHROSCOPY WITH ROTATOR CUFF REPAIR Left   . TEE WITHOUT CARDIOVERSION N/A 08/22/2017   Procedure: TRANSESOPHAGEAL ECHOCARDIOGRAM (TEE);  Surgeon: Burnell Blanks, MD;  Location: [place];  Service: Open Heart Surgery;  Laterality: N/A;  . TRANSCATHETER AORTIC VALVE REPLACEMENT, TRANSFEMORAL N/A 08/22/2017   Procedure: TRANSCATHETER AORTIC VALVE REPLACEMENT,  TRANSFEMORAL;  Surgeon: Burnell Blanks, MD;  Location: [place];  Service: Open Heart Surgery;  Laterality: N/A;   Social History   Socioeconomic History  . Marital status: Married    Spouse name: None  . Number of children: None  . Years of education: None  . Highest education level: None  Social Needs  . Financial resource strain: None  . Food insecurity - worry: None  . Food insecurity - inability: None  . Transportation needs - medical: None  . Transportation needs - non-medical: None  Occupational History  . None  Tobacco Use  . Smoking status: Former Smoker    Packs/day: 1.00    Years: 57.00    Pack years: 57.00    Types: Cigarettes    Start date: 09/12/1958    Last attempt to quit: 04/12/2016    Years since quitting: 1.3  . Smokeless tobacco: Never Used  Substance and Sexual Activity  . Alcohol use: No  . Drug use: No  . Sexual activity: None  Other Topics Concern  . None  Social History Narrative  . None  Outpatient Encounter Medications as of 09/01/2017  Medication Sig  . aspirin EC 81 MG tablet Take 1 tablet (81 mg total) by mouth daily.  [name] atorvastatin (LIPITOR) 20 MG tablet Take 1 tablet (20 mg total) by mouth daily.  . Blood Glucose Monitoring Suppl (ONETOUCH VERIO) w/Device KIT 1 each by Does not apply route as needed.  . carvedilol (COREG) 3.125 MG tablet Take 1 tablet (3.125 mg total) by mouth 2 (two) times daily with a meal.  . clopidogrel (PLAVIX) 75 MG tablet Take 1 tablet (75 mg total) by mouth daily.  [name] glucose blood (ONETOUCH VERIO) test strip Use as instructed  . insulin aspart (FIASP FLEXTOUCH) 100 UNIT/ML FlexPen Inject 10-16 Units into the skin 3 (three) times daily with meals.  . insulin degludec (TRESIBA) 100 UNIT/ML SOPN FlexTouch Pen Inject 0.4 mLs (40 Units total) into the skin daily at 10 pm.  . Insulin Pen Needle (B-D ULTRAFINE III SHORT PEN) 31G X 8 MM MISC 1 each by Does not apply route as directed.  [name] lisinopril (PRINIVIL,ZESTRIL)  10 MG tablet Take 1 tablet (10 mg total) by mouth daily.  . [DISCONTINUED] insulin degludec (TRESIBA) 100 UNIT/ML SOPN FlexTouch Pen Inject 40 Units into the skin daily at 10 pm.  . [DISCONTINUED] Insulin Glargine 300 UNIT/ML SOPN Inject 40 Units into the skin at bedtime.   No facility-administered encounter medications on file as of 09/01/2017.     ALLERGIES: No Known Allergies  VACCINATION STATUS:  There is no immunization history on file for this patient.  Diabetes  He presents for his follow-up diabetic visit. He has type 2 diabetes mellitus. Onset time: Patient was diagnosed at approximate age of 48 years. His disease course has been improving (His most recent A1c of 10.4% on 08/17/2017 while he was being prepared for surgery for aortic stenosis, pancreatic cancer, and renal cell carcinoma.). There are no hypoglycemic associated symptoms. Pertinent negatives for hypoglycemia include no confusion, headaches, pallor or seizures. Associated symptoms include blurred vision, foot paresthesias, polydipsia and polyuria. Pertinent negatives for diabetes include no chest pain, no fatigue, no polyphagia and no weakness. There are no hypoglycemic complications. Symptoms are improving. Diabetic complications include peripheral neuropathy. Risk factors for coronary artery disease include diabetes mellitus, dyslipidemia, hypertension, tobacco exposure, sedentary lifestyle and male sex. Current diabetic treatment includes oral agent (monotherapy) (He is currently taking glipizide 5 mg by mouth daily.). His weight is decreasing steadily. He is following a generally unhealthy diet. When asked about meal planning, he reported none. He has not had a previous visit with a dietitian. He never participates in exercise. His breakfast blood glucose range is generally 180-200 mg/dl. His lunch blood glucose range is generally >200 mg/dl. His dinner blood glucose range is generally >200 mg/dl. His bedtime blood glucose  range is generally >200 mg/dl. His overall blood glucose range is >200 mg/dl. ( His most recent A1c was 10.4% from 08/17/2017. - He is advised to control diabetes as soon as possible to clear him for his planned surgeries for pancreatic cancer, renal cell cancer.) An ACE inhibitor/angiotensin II receptor blocker is being taken. He does not see a podiatrist.Eye exam is not current.  Hyperlipidemia  This is a chronic problem. The current episode started more than 1 year ago. Exacerbating diseases include diabetes. Pertinent negatives include no chest pain, myalgias or shortness of breath. Current antihyperlipidemic treatment includes statins. Risk factors for coronary artery disease include diabetes mellitus, dyslipidemia, hypertension, male sex and a sedentary lifestyle.  Hypertension  This is a chronic problem. The current episode started more than 1 year ago. Associated symptoms include blurred vision. Pertinent negatives include no chest pain, headaches, neck pain, palpitations or shortness of breath. Risk factors for coronary artery disease include dyslipidemia, diabetes mellitus, male gender, sedentary lifestyle and smoking/tobacco exposure. Past treatments include ACE inhibitors.    Review of Systems  Constitutional: Negative for chills, fatigue, fever and unexpected weight change.  HENT: Negative for dental problem, mouth sores and trouble swallowing.   Eyes: Positive for blurred vision. Negative for visual disturbance.  Respiratory: Negative for cough, choking, chest tightness, shortness of breath and wheezing.   Cardiovascular: Negative for chest pain, palpitations and leg swelling.  Gastrointestinal: Negative for abdominal distention, abdominal pain, constipation, diarrhea, nausea and vomiting.  Endocrine: Positive for polydipsia and polyuria. Negative for polyphagia.  Genitourinary: Negative for dysuria, flank pain, hematuria and urgency.  Musculoskeletal: Negative for back pain, gait  problem, myalgias and neck pain.  Skin: Negative for pallor, rash and wound.  Neurological: Negative for seizures, syncope, weakness, numbness and headaches.  Psychiatric/Behavioral: Negative.  Negative for confusion and dysphoric mood.    Objective:    BP (!) 159/82   Pulse 76   Ht 5' 10"  (1.778 m)   Wt 171 lb (77.6 kg)   BMI 24.54 kg/m   Wt Readings from Last 3 Encounters:  09/01/17 171 lb (77.6 kg)  09/01/17 171 lb 8 oz (77.8 kg)  08/31/17 171 lb 9.6 oz (77.8 kg)     Physical Exam  Constitutional: He is oriented to person, place, and time. He appears well-developed and well-nourished. He is cooperative. No distress.  HENT:  Head: Normocephalic and atraumatic.  Eyes: EOM are normal.  Neck: Normal range of motion. Neck supple. No tracheal deviation present. No thyromegaly present.  Cardiovascular: Normal rate, S1 normal, S2 normal and normal heart sounds. Exam reveals no gallop.  No murmur heard. Pulses:      Dorsalis pedis pulses are 1+ on the right side, and 1+ on the left side.       Posterior tibial pulses are 1+ on the right side, and 1+ on the left side.  Pulmonary/Chest: Breath sounds normal. No respiratory distress. He has no wheezes.  Abdominal: Soft. Bowel sounds are normal. He exhibits no distension. There is no tenderness. There is no guarding and no CVA tenderness.  Musculoskeletal: He exhibits no edema.       Right shoulder: He exhibits no swelling and no deformity.  Neurological: He is alert and oriented to person, place, and time. He has normal strength and normal reflexes. No cranial nerve deficit or sensory deficit. Gait normal.  Skin: Skin is warm and dry. No rash noted. No cyanosis. Nails show no clubbing.  Psychiatric: He has a normal mood and affect. His speech is normal and behavior is normal. Judgment and thought content normal. Cognition and memory are normal.    CMP     Component Value Date/Time   NA 132 (L) 08/23/2017 0411   K 4.2 08/23/2017  0411   CL 104 08/23/2017 0411   CO2 21 (L) 08/23/2017 0411   GLUCOSE 236 (H) 08/23/2017 0411   BUN 21 (H) 08/23/2017 0411   CREATININE 0.97 08/23/2017 0411   CALCIUM 8.6 (L) 08/23/2017 0411   PROT 6.1 (L) 08/17/2017 0852   ALBUMIN 3.8 08/17/2017 0852   AST 15 08/17/2017 0852   ALT 11 (L) 08/17/2017 0852   ALKPHOS 79 08/17/2017 0852   BILITOT 0.6 08/17/2017 8938  GFRNONAA >60 08/23/2017 0411   GFRAA >60 08/23/2017 0411     Diabetic Labs (most recent): Lab Results  Component Value Date   HGBA1C 10.4 (H) 08/17/2017     Lipid Panel ( most recent) Lipid Panel     Component Value Date/Time   CHOL 102 03/16/2017 0840   TRIG 71 03/16/2017 0840   HDL 36 (L) 03/16/2017 0840   CHOLHDL 2.8 03/16/2017 0840   VLDL 14 03/16/2017 0840   LDLCALC 52 03/16/2017 0840      Assessment & Plan:   1. DM type 2 causing vascular disease ([place])  - Manuel Olson has currently uncontrolled symptomatic type 2 DM since  71 years of age. - He came with significantly above target blood glucose profile despite initiation of basal insulin at 40 units. -  most recent A1c of 10.4 %. Recent labs reviewed.  -his diabetes is complicated by pancreatic cancer, renal cell carcinoma, being prepared for surgery  and Manuel Olson remains at a high risk for more acute and chronic complications which include CAD, CVA, CKD, retinopathy, and neuropathy. These are all discussed in detail with the patient.  - I have counseled him on diet management by adopting a carbohydrate restricted/protein rich diet. He is not a candidate for weight loss.  -  Suggestion is made for him to avoid simple carbohydrates  from his diet including Cakes, Sweet Desserts / Pastries, Ice Cream, Soda (diet and regular), Sweet Tea, Candies, Chips, Cookies, Store Bought Juices, Alcohol in Excess of  1-2 drinks a day, Artificial Sweeteners, and "Sugar-free" Products. This will help patient to have stable blood glucose profile and  potentially avoid unintended weight gain.  - I encouraged him to switch to  unprocessed or minimally processed complex starch and increased protein intake (animal or plant source), fruits, and vegetables.  - he is advised to stick to a routine mealtimes to eat 3 meals  a day and avoid unnecessary snacks ( to snack only to correct hypoglycemia).   - he will be scheduled with Jearld Fenton, RDN, CDE for individualized diabetes education- consult pending.  - I have approached him with the following individualized plan to manage diabetes and patient agrees:   - He is being prepared for surgery for pancreatic  and renal cancer , and wishes to control diabetes as soon as possible. - Based on his presentation with persistent postprandial hyperglycemia despite a modest dose of basal insulin, he will be considered for intensive basal/bolus insulin to achieve  control of diabetes before his surgery.  -  I advised him to continue   strict monitoring of glucose 4 times a day-before meals and at bedtime. - I discussed and increase Tresiba  to 40 units daily at bedtime. - I discussed and initiated prandial insulin  Fiasp 10 units 3 times a day before meals for pre-meal blood glucose above 90 mg/dL , plus correction dose for readings above 150 mg per deciliter.  - Patient is warned not to take insulin without proper monitoring per orders.  -Patient is encouraged to call clinic for blood glucose levels less than 70 or above 200 mg /dl. - he is not a suitable candidate for for incretin therapy .  - Patient specific target  A1c;  LDL, HDL, Triglycerides, and  Waist Circumference were discussed in detail.  2) BP/HTN: uncontrolled. Continue current medications including ACEI/ARB. 3) Lipids/HPL: controlled.   Patient is advised to continue statins. 4)  Weight/Diet: CDE Consult  is pending,  exercise, and detailed carbohydrates information provided.  5) Chronic Care/Health Maintenance:  -he  is on ACEI/ARB  and Statin medications and  is encouraged to continue to follow up with Ophthalmology, Dentist,  Podiatrist at least yearly or according to recommendations, and advised to  stay away from smoking ( he has 70+ pack year smoking history). I have recommended yearly flu vaccine and pneumonia vaccination at least every 5 years; moderate intensity exercise for up to 150 minutes weekly; and  sleep for at least 7 hours a day.  - I advised patient to maintain close follow up with Satira Sark, MD for primary care needs. - Time spent with the patient: 25 min, of which >50% was spent in reviewing his sugar logs , discussing his hypo- and hyper-glycemic episodes, reviewing his current and  previous labs and insulin doses and developing a plan to avoid hypo- and hyper-glycemia.   Follow up plan: - Return in about 2 weeks (around 09/15/2017) for follow up with meter and logs- no labs.  Glade Lloyd, MD [facility] [address] [place], [place] [address] Phone: [phone]  Fax: [phone]    09/01/2017, 12:38 PM  This note was partially dictated with voice recognition software. Similar sounding words can be transcribed inadequately or may not  be corrected upon review.

## 2017-09-01 NOTE — Progress Notes (Signed)
START ON PATHWAY REGIMEN - Pancreatic     A cycle is every 14 days:     Oxaliplatin      Leucovorin      Irinotecan      5-Fluorouracil      5-Fluorouracil   **Always confirm dose/schedule in your pharmacy ordering system**    Patient Characteristics: Adenocarcinoma, Borderline Resectable or High Risk, Potentially Resectable, Primary Neoadjuvant Therapy, PS = 0, 1 Histology: Adenocarcinoma Current evidence of distant metastases<= No AJCC T Category: Staged < 8th Ed. AJCC N Category: Staged < 8th Ed. AJCC M Category: Staged < 8th Ed. AJCC 8 Stage Grouping: Staged < 8th Ed. Intent of Therapy: Curative Intent, Discussed with Patient

## 2017-09-01 NOTE — Progress Notes (Signed)
[place] OFFICE PROGRESS NOTE   Diagnosis: Pancreas cancer  INTERVAL HISTORY:   [name] returns as scheduled.  He underwent transcatheter aortic valve replacement 08/22/2017.  He reports an uneventful operative recovery.  He has noted improvement in dyspnea.  No complaint today.  He is now taking insulin for diabetes, but his blood sugar remains elevated.  Objective:  Vital signs in last 24 hours:  Blood pressure (!) 174/78, pulse 69, temperature 97.8 F (36.6 C), temperature source Oral, resp. rate 18, height 5' 10"  (1.778 m), weight 171 lb 8 oz (77.8 kg), SpO2 97 %.   Resp: Clear bilaterally Cardio: Regular rate and rhythm GI: No hepatosplenomegaly, no mass, no apparent ascites, mild tenderness in the mid upper abdomen, resolving ecchymoses with small hematomas at the groin bilaterally Vascular: No leg edema     Lab Results:  Lab Results  Component Value Date   WBC 8.8 08/23/2017   HGB 11.6 (L) 08/23/2017   HCT 35.2 (L) 08/23/2017   MCV 91.9 08/23/2017   PLT 102 (L) 08/23/2017   NEUTROABS 5.4 06/26/2017    CMP     Component Value Date/Time   NA 132 (L) 08/23/2017 0411   K 4.2 08/23/2017 0411   CL 104 08/23/2017 0411   CO2 21 (L) 08/23/2017 0411   GLUCOSE 236 (H) 08/23/2017 0411   BUN 21 (H) 08/23/2017 0411   CREATININE 0.97 08/23/2017 0411   CALCIUM 8.6 (L) 08/23/2017 0411   PROT 6.1 (L) 08/17/2017 0852   ALBUMIN 3.8 08/17/2017 0852   AST 15 08/17/2017 0852   ALT 11 (L) 08/17/2017 0852   ALKPHOS 79 08/17/2017 0852   BILITOT 0.6 08/17/2017 0852   GFRNONAA >60 08/23/2017 0411   GFRAA >60 08/23/2017 0411     Imaging:  Nm Pet Image Initial (pi) Skull Base To Thigh  Result Date: 08/30/2017 CLINICAL DATA:  Initial Treatment strategy for pancreatic adenocarcinoma. Renal mass on prior CT. EXAM: NUCLEAR MEDICINE PET SKULL BASE TO THIGH TECHNIQUE: 11.9 mCi F-18 FDG was injected intravenously. Full-ring PET imaging was performed from the  skull base to thigh after the radiotracer. CT data was obtained and used for attenuation correction and anatomic localization. FASTING BLOOD GLUCOSE:  Value: 161 mg/dl COMPARISON:  CTAs of 07/20/2017, chest abdomen and pelvis. Abdominal MRI of 07/27/2017. FINDINGS: NECK: No areas of abnormal hypermetabolism. No cervical adenopathy. Bilateral carotid atherosclerosis. CHEST: No pulmonary parenchymal or thoracic nodal hypermetabolism. Status post aortic valve repair. Multivessel coronary artery atherosclerosis. Mild centrilobular emphysema. Right lower lobe pulmonary nodule measures 8 mm on image 125/ series 4. This is at the lower end of PET resolution. ABDOMEN/PELVIS: Vague hypermetabolism corresponding to the pancreatic head lesion on the prior MRI. This measures on the order of 2.4 cm and a S.U.V. max of 4.6 on image 179/series 4. No hypermetabolic regional lymph nodes. Partially exophytic upper pole left renal lesion is relatively decreased in activity relative to the normal adjacent kidney. Measures 6.6 x 5.6 cm and a S.U.V. max of 5.8, including on image 182/ series 4. Abdominal aortic atherosclerosis. No evidence of acute pancreatitis. 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 The right common iliac measures 2.7 cm today and is similar. Right inguinal hernia repair. Small bilateral hydroceles. SKELETON: No abnormal marrow activity. Bilateral hip osteoarthritis. No suspicious focal osseous lesion. IMPRESSION: 1. Findings again suspicious for adenocarcinoma of the pancreatic head and left-sided renal cell carcinoma. No evidence of hypermetabolic metastatic disease. 2. Isolated  right lower lobe pulmonary nodule, at the low end of PET resolution. Recommend attention on follow-up. 3. Coronary artery atherosclerosis. Aortic Atherosclerosis (ICD10-I70.0). 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 Electronically Signed   By: Abigail Miyamoto  M.D.   On: 08/30/2017 13:01    Medications: I have reviewed the patient's current medications.   Assessment/Plan:  1. Adenocarcinoma of the pancreas head/neck, status post an EUS biopsy 08/02/2017 confirming adenocarcinoma ? EUS 08/02/2017-pancreas head/neck mass, abutment of the portal vein, no peripancreatic adenopathy ? MRI abdomen 07/27/2017- head/neck pancreas mass with mass-effect on the portal splenic confluence, no involvement of the celiac axis or superior mesenteric artery, no evidence of metastatic disease ? PET scan 08/31/2017-very hypermetabolism at the pancreatic head mass, no evidence of metastatic disease.  The isolated right lower lobe nodule below PET resolution, left sided renal mass with decreased activity relative to the normal adjacent kidney  2. Left renal mass consistent with a renal cell carcinoma seen on CT 07/21/2017 and MRI 07/28/2017  3.   Severe aortic stenosis-TAVR 08/22/2017  4.   Coronary artery disease, status post myocardial infarction March 2018, status post placement of coronary stents  5.   Diabetes  6.   History of tobacco use  7.   Hypertension  Disposition: [name] has borderline resectable pancreas cancer.  He underwent transcatheter aortic valve replacement.  He appears to have recovered from the procedure well.  I discussed treatment options with [name] and his wife.  I explained data supporting the use of FOLFIRINOX chemotherapy in the adjuvant setting.  FOLFIRINOX is often used in the neoadjuvant setting for patients with borderline resectable tumors.  I reviewed the potential toxicities associated with FOLFIRINOX regimen including the chance for nausea/vomiting, mucositis, diarrhea, alopecia, and hematologic toxicity.  We discussed the rash, sun sensitivity, hand/foot syndrome, and hyperpigmentation associated with 5-fluorouracil.  We reviewed the allergic reaction and various types of neuropathy associated with oxaliplatin.   He agrees to proceed.  He will attend a chemotherapy teaching class.  He has diabetes and is now taking a long-acting insulin preparation.  I explained his blood sugar will likely increase with Decadron given as an antiemetic premedication.  He plans to see his diabetes physician within the next few days.  He will benefit from an insulin sliding scale.  I discussed the case with Dr. Barry Dienes.  She will place a Port-A-Cath prior to beginning chemotherapy.  He will receive approximately 5 cycles of FOLFIRINOX and then undergo a restaging CT evaluation.   The plan is to begin a first cycle of FOLFIRINOX 09/16/2017.  He will return for an office visit and cycle 2 10/02/2017.  40 minutes were spent with the patient today.  The majority of the time was used for counseling and coordination of care.      [name], MD  09/01/2017  8:20 AM

## 2017-09-01 NOTE — Telephone Encounter (Signed)
Gave avs and calendar for December and January

## 2017-09-01 NOTE — Progress Notes (Signed)
  Oncology Nurse Navigator Documentation  Navigator Location: [facility] (09/01/17 0930)   )Navigator Encounter Type: Follow-up Appt (09/01/17 0930)   Abnormal Finding Date: 07/21/17 (09/01/17 0930) Confirmed Diagnosis Date: 08/02/17 (09/01/17 0930)               Patient Visit Type: MedOnc (09/01/17 0930)   Barriers/Navigation Needs: Education (09/01/17 0930)   Interventions: Education (09/01/17 0930)  Completed education on Port-A-Cath.   Education Method: Verbal (09/01/17 0930)      Acuity: Level 2 (09/01/17 0930)   Acuity Level 2: Educational needs (09/01/17 0930)     Time Spent with Patient: 15 (09/01/17 0930)

## 2017-09-01 NOTE — Addendum Note (Signed)
Addended by: Brien Few on: 09/01/2017 04:26 PM   Modules accepted: Orders

## 2017-09-06 ENCOUNTER — Ambulatory Visit: Payer: Medicare Other | Admitting: "Endocrinology

## 2017-09-07 ENCOUNTER — Other Ambulatory Visit: Payer: Medicare Other

## 2017-09-07 ENCOUNTER — Other Ambulatory Visit (HOSPITAL_BASED_OUTPATIENT_CLINIC_OR_DEPARTMENT_OTHER): Payer: Medicare Other

## 2017-09-07 ENCOUNTER — Other Ambulatory Visit: Payer: Self-pay

## 2017-09-07 ENCOUNTER — Encounter: Payer: Self-pay | Admitting: *Deleted

## 2017-09-07 DIAGNOSIS — C258 Malignant neoplasm of overlapping sites of pancreas: Secondary | ICD-10-CM

## 2017-09-07 DIAGNOSIS — C250 Malignant neoplasm of head of pancreas: Secondary | ICD-10-CM

## 2017-09-07 DIAGNOSIS — C259 Malignant neoplasm of pancreas, unspecified: Secondary | ICD-10-CM | POA: Diagnosis not present

## 2017-09-07 DIAGNOSIS — Z95828 Presence of other vascular implants and grafts: Secondary | ICD-10-CM

## 2017-09-07 LAB — COMPREHENSIVE METABOLIC PANEL
ALT: 12 U/L (ref 0–55)
AST: 13 U/L (ref 5–34)
Albumin: 3.9 g/dL (ref 3.5–5.0)
Alkaline Phosphatase: 89 U/L (ref 40–150)
Anion Gap: 8 mEq/L (ref 3–11)
BUN: 29.8 mg/dL — ABNORMAL HIGH (ref 7.0–26.0)
CO2: 27 mEq/L (ref 22–29)
Calcium: 9.8 mg/dL (ref 8.4–10.4)
Chloride: 103 mEq/L (ref 98–109)
Creatinine: 1.2 mg/dL (ref 0.7–1.3)
EGFR: 59 mL/min/{1.73_m2} — ABNORMAL LOW (ref >60)
Glucose: 227 mg/dl — ABNORMAL HIGH (ref 70–140)
Potassium: 5.4 mEq/L — ABNORMAL HIGH (ref 3.5–5.1)
Sodium: 139 mEq/L (ref 136–145)
Total Bilirubin: 0.37 mg/dL (ref 0.20–1.20)
Total Protein: 6.9 g/dL (ref 6.4–8.3)

## 2017-09-07 LAB — CBC WITH DIFFERENTIAL/PLATELET
BASO%: 0.8 % (ref 0.0–2.0)
Basophils Absolute: 0.1 10*3/uL (ref 0.0–0.1)
EOS%: 4.8 % (ref 0.0–7.0)
Eosinophils Absolute: 0.4 10*3/uL (ref 0.0–0.5)
HCT: 38.7 % (ref 38.4–49.9)
HGB: 12.7 g/dL — ABNORMAL LOW (ref 13.0–17.1)
LYMPH%: 16.6 % (ref 14.0–49.0)
MCH: 30 pg (ref 27.2–33.4)
MCHC: 32.7 g/dL (ref 32.0–36.0)
MCV: 91.8 fL (ref 79.3–98.0)
MONO#: 0.9 10*3/uL (ref 0.1–0.9)
MONO%: 11.8 % (ref 0.0–14.0)
NEUT#: 5.2 10*3/uL (ref 1.5–6.5)
NEUT%: 66 % (ref 39.0–75.0)
Platelets: 161 10*3/uL (ref 140–400)
RBC: 4.22 10*6/uL (ref 4.20–5.82)
RDW: 14 % (ref 11.0–14.6)
WBC: 7.9 10*3/uL (ref 4.0–10.3)
lymph#: 1.3 10*3/uL (ref 0.9–3.3)

## 2017-09-07 MED ORDER — PROCHLORPERAZINE MALEATE 5 MG PO TABS: 5.0000 mg | ORAL_TABLET | Freq: Four times a day (QID) | ORAL | 0 refills | Status: DC | PRN

## 2017-09-07 MED ORDER — LIDOCAINE-PRILOCAINE 2.5-2.5 % EX CREA: 1.0000 "application " | TOPICAL_CREAM | CUTANEOUS | 0 refills | Status: DC | PRN

## 2017-09-07 NOTE — Addendum Note (Signed)
Addended by: Sherril Croon on: 09/07/2017 03:04 PM   Modules accepted: Orders

## 2017-09-08 ENCOUNTER — Telehealth: Payer: Self-pay | Admitting: Emergency Medicine

## 2017-09-08 ENCOUNTER — Other Ambulatory Visit: Payer: Self-pay | Admitting: Emergency Medicine

## 2017-09-08 ENCOUNTER — Telehealth: Payer: Self-pay | Admitting: Oncology

## 2017-09-08 DIAGNOSIS — C250 Malignant neoplasm of head of pancreas: Secondary | ICD-10-CM

## 2017-09-08 LAB — CANCER ANTIGEN 19-9: CA 19-9: 42 U/mL — ABNORMAL HIGH (ref 0–35)

## 2017-09-08 NOTE — Telephone Encounter (Addendum)
Patient verbalized understanding of this. Pt requested that labs and diagnosis be sent to [facility] so that they can manage his diabetes. Information faxed by this nurse to [phone] Attn:Lisa.  Scheduling message sent for lab appt.   ----- Message from Ladell Pier, MD sent at 09/07/2017 10:07 PM EST ----- Please call patient, potassium elevated, repeat bmet next 1 week, will stop lisinopril if still high

## 2017-09-08 NOTE — Telephone Encounter (Signed)
Scheduled appt per 12/28 sch msg - spoke with wife regarding appts that were added.

## 2017-09-10 ENCOUNTER — Other Ambulatory Visit: Payer: Self-pay | Admitting: Oncology

## 2017-09-11 ENCOUNTER — Other Ambulatory Visit: Payer: Self-pay | Admitting: Cardiology

## 2017-09-11 NOTE — Progress Notes (Signed)
[facility]                                       Cardiology Office Note    Date:  09/14/2017   ID:  Manuel Olson, DOB [birth year], MRN [id number]  PCP:  Satira Sark, MD  Cardiologist:  Dr Domenic Polite / Dr. Angelena Form & Dr. Cyndia Bent (TAVR)  CC: 1 month s/p TAVR  History of Present Illness:  Manuel Olson is a 71 y.o. male with a history of HTN, DMT2, previous heavy smoking, CAD: STEMI s/p multivessel stenting (11/2016), recently diagnosed pancreatic cancer and probable renal cell carcinoma and severe aortic stenosis s/p TAVR (12/11) who presents to clinic for follow up.   He suffered a STEMI while in [place] in 11/2016.He was taken by his wife to [facility] and underwent emergent catheterization showing 70% stenosis in the mid LAD, occlusion of a large diagonal branch, 90% proximal right coronary stenosis, and 60-70% mid right coronary stenosis. Left ventricular function was preserved. He underwent PCI with drug-eluting stents in the mid LAD, diagonal branch, and proximal and mid right coronary artery. He was told that he also had aortic stenosis and was to follow-up with a cardiologist as soon as he returned to [facility]. He reports a long history of a heart murmur which was never worked up. Upon returning he was evaluated by Dr. Leavy Cella April and underwent an echocardiogram showing severe aortic stenosis with a mean gradient of 49 mmHg and a dimensionless index of 0.18. His ejection fraction was 55-60%. He says that he was feeling fairly well at that time and quit smoking. He saw Dr. Domenic Polite again in early October and a repeat echocardiogram showed a mean aortic valve gradient of 43 mmHg with a dimensionless index of 0.22.  He was referred to Dr. Leatha Gilding TAVR evaluationand underwent cardiac catheterization on 07/05/2017 which showed continued patency of the stented segments in his  LAD, first diagonal, and right coronary artery. There was mild diffuse nonobstructive residual coronary artery disease. The mean aortic valve gradient was measured at 33 mmHg with a peak to peak gradient of 42 mmHg.   After being seen by Dr. Burt Knack he underwent workup forTAVR. His CT scan showed a mass in the head of his pancreas as well as a large left renal tumor. 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 He has been seen by Dr. Roderick Pee well as byoneof the urologists. He has been evaluated by Dr. Miles Costain the plan is for neoadjuvant chemotherapy and possibly radiotherapy with subsequent resection of the pancreatic mass as well as left nephrectomy.  After evaluation by our multidisciplinary valve team and discussion with his oncologist it was decided to proceed with TAVR with plans to continue treatment and surgical resection of pancreatic mass and nephrectomy. He underwent successful placement of a 26 mmEdwards Sapien 3 THVvia the TF approach on 08/22/17. Post operative echo showed a normally functioning valve with no YSA:YTKZ gradient: 15 mm Hg. Peak gradient: 26 mm Hg. He was discharged home on ASA and plavix.  Recent PET scan showed no metastatic disease. Recent lab work showed K 5.4. Repeat BMET ordered for 09/15/17.  Today he presents to clinic for follow up. He is feeling excellent. He has noticed much less fatigue and  has a lot more energy. His chest feels light and his breathing is much improved. No LE edema, orthopnea or PND. No dizziness or syncope. No blood in stool or urine. No palpitations.      Past Medical History:  Diagnosis Date  . Arthritis   . CAD (coronary artery disease)    a. 11/2016 in [place]: STEMI s/p DES to mid LAD, DES to diagonal, DES x 2 to proximal and mid RCA   . Essential hypertension   . Former smoker   . GERD (gastroesophageal reflux disease)   .  Hyperlipidemia   . Pancreatic cancer ([place])   . Renal mass   . S/P TAVR (transcatheter aortic valve replacement)    a. 08/2017:  Edwards Sapien 3 THV (size 26 mm, model #9600CM26A , serial # [id number])  . Severe aortic stenosis    a. 08/2017: s/p TAVR by Dr. Angelena Form and Dr. Cyndia Bent.   . 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 Type 2 diabetes mellitus ([place])     Past Surgical History:  Procedure Laterality Date  . APPENDECTOMY    . EUS N/A 08/02/2017   Procedure: UPPER ENDOSCOPIC ULTRASOUND (EUS) RADIAL;  Surgeon: [name], MD;  Location: [facility];  Service: Endoscopy;  Laterality: N/A;  . EYE SURGERY Left   . HAND SURGERY Bilateral   . HERNIA REPAIR Right   . RIGHT/LEFT HEART CATH AND CORONARY ANGIOGRAPHY N/A 07/05/2017   Procedure: RIGHT/LEFT HEART CATH AND CORONARY ANGIOGRAPHY;  Surgeon: [name], MD;  Location: [place] [facility] CV LAB;  Service: Cardiovascular;  Laterality: N/A;  . SHOULDER ARTHROSCOPY WITH ROTATOR CUFF REPAIR Left   . TEE WITHOUT CARDIOVERSION N/A 08/22/2017   Procedure: TRANSESOPHAGEAL ECHOCARDIOGRAM (TEE);  Surgeon: Burnell Blanks, MD;  Location: [place];  Service: Open Heart Surgery;  Laterality: N/A;  . TRANSCATHETER AORTIC VALVE REPLACEMENT, TRANSFEMORAL N/A 08/22/2017   Procedure: TRANSCATHETER AORTIC VALVE REPLACEMENT, TRANSFEMORAL;  Surgeon: Burnell Blanks, MD;  Location: [facility];  Service: Open Heart Surgery;  Laterality: N/A;    Current Medications: Outpatient Medications Prior to Visit  Medication Sig Dispense Refill  . aspirin EC 81 MG tablet Take 1 tablet (81 mg total) by mouth daily. 90 tablet 3  . atorvastatin (LIPITOR) 20 MG tablet Take 1 tablet (20 mg total) by mouth daily. 30 tablet 6  . Blood Glucose Monitoring Suppl (ONETOUCH VERIO) w/Device KIT 1 each by Does not apply route as needed. 1 kit 0  . carvedilol (COREG) 3.125 MG tablet TAKE (1) TABLET BY MOUTH TWICE A DAY WITH MEALS (BREAKFAST AND SUPPER) 180 tablet 2  .  clopidogrel (PLAVIX) 75 MG tablet Take 1 tablet (75 mg total) by mouth daily. 90 tablet 0  . glucose blood (ONETOUCH VERIO) test strip Use as instructed 150 each 2  . insulin aspart (FIASP FLEXTOUCH) 100 UNIT/ML FlexPen Inject 10-16 Units into the skin 3 (three) times daily with meals. 5 pen 2  . insulin degludec (TRESIBA) 100 UNIT/ML SOPN FlexTouch Pen Inject 0.4 mLs (40 Units total) into the skin daily at 10 pm. 5 pen 2  . Insulin Pen Needle (B-D ULTRAFINE III SHORT PEN) 31G X 8 MM MISC 1 each by Does not apply route as directed. 100 each 3  . lidocaine-prilocaine (EMLA) cream Apply 1 application topically as needed. Apply small amount of cream over port site 1-2 hrs prior to chemo, cover with plastic wrap. 30 g 0  . lisinopril (PRINIVIL,ZESTRIL) 10 MG tablet TAKE 1  TABLET BY MOUTH ONCE DAILY. 90 tablet 2  . prochlorperazine (COMPAZINE) 5 MG tablet Take 1 tablet (5 mg total) by mouth every 6 (six) hours as needed for nausea or vomiting. 30 tablet 0   No facility-administered medications prior to visit.      Allergies:   Patient has no known allergies.   Social History   Socioeconomic History  . Marital status: Married    Spouse name: None  . Number of children: None  . Years of education: None  . Highest education level: None  Social Needs  . Financial resource strain: None  . Food insecurity - worry: None  . Food insecurity - inability: None  . Transportation needs - medical: None  . Transportation needs - non-medical: None  Occupational History  . None  Tobacco Use  . Smoking status: Former Smoker    Packs/day: 1.00    Years: 57.00    Pack years: 57.00    Types: Cigarettes    Start date: 09/12/1958    Last attempt to quit: 04/12/2016    Years since quitting: 1.4  . Smokeless tobacco: Never Used  Substance and Sexual Activity  . Alcohol use: No  . Drug use: No  . Sexual activity: None  Other Topics Concern  . None  Social History Narrative  . None     Family History:   The patient's family history includes CAD in his brother; Hypertension in his brother; Lupus in his mother.     ROS:   Please see the history of present illness.    ROS All other systems reviewed and are negative.   PHYSICAL EXAM:   VS:  BP (!) 146/70   Pulse 69   Ht 5' 10"  (1.778 m)   Wt 171 lb (77.6 kg)   SpO2 96%   BMI 24.54 kg/m    GEN: Well nourished, well developed, in no acute distress  HEENT: normal  Neck: no JVD, carotid bruits, or masses Cardiac: RRR; no murmurs, rubs, or gallops,no edema  Respiratory:  clear to auscultation bilaterally, normal work of breathing GI: soft, nontender, nondistended, + BS MS: no deformity or atrophy  Skin: warm and dry, no rash Neuro:  Alert and Oriented x 3, Strength and sensation are intact Psych: euthymic mood, full affect   Wt Readings from Last 3 Encounters:  09/13/17 171 lb (77.6 kg)  09/13/17 171 lb (77.6 kg)  09/01/17 171 lb (77.6 kg)      Studies/Labs Reviewed:   EKG:  EKG is NOT ordered today.   Recent Labs: 08/17/2017: B Natriuretic Peptide 163.2 08/23/2017: Magnesium 1.8 09/07/2017: ALT 12; BUN 29.8; Creatinine 1.2; HGB 12.7; Platelets 161; Potassium 5.4 No visable hemolysis; Sodium 139   Lipid Panel    Component Value Date/Time   CHOL 102 03/16/2017 0840   TRIG 71 03/16/2017 0840   HDL 36 (L) 03/16/2017 0840   CHOLHDL 2.8 03/16/2017 0840   VLDL 14 03/16/2017 0840   LDLCALC 52 03/16/2017 0840    Additional studies/ records that were reviewed today include:   TAVR OPERATIVE NOTE   Date of Procedure:08/22/2017  Preoperative Diagnosis:Severe Aortic Stenosis  Procedure:   Transcatheter Aortic Valve Replacement - Transfemoral Approach Edwards Sapien 3 THV (size 25m, model #9600CM26A, serial # [id number]  Co-Surgeons:Christopher MAngelena Form MD and BGaye Pollack MD   Pre-operative Echo Findings: ? Severe aortic  stenosis ? Normalleft ventricular systolic function  Post-operative Echo Findings: ? Noparavalvular leak  _____________   Post operative echo 08/23/17 Study  Conclusions - Left ventricle: The cavity size was normal. Wall thickness was increased in a pattern of mild LVH. Systolic function was normal. The estimated ejection fraction was in the range of 55% to 60%. Wall motion was normal; there were no regional wall motion abnormalities. Doppler parameters are consistent with abnormal left ventricular relaxation (grade 1 diastolic dysfunction). The E/e&' ratio is between 8-15, suggesting normal LV filling pressure. - Aortic valve: s/p TAVR valve No obstruction. No paravalvular leak. Mean gradient (S): 15 mm Hg. Peak gradient (S): 26 mm Hg. Valve area (VTI): 2.56 cm^2. Valve area (Vmax): 2.63 cm^2. Valve area (Vmean): 2.22 cm^2. - Mitral valve: Calcified annulus. Mildly thickened leaflets . There was trivial regurgitation. - Left atrium: The atrium was normal in size. - Systemic veins: The IVC measures >2.1 cm, but collapses >50%, suggesting an elevated RA pressure of 8 mmHg. - Pericardium, extracardiac: There was no pericardial effusion. Impressions: - Compared to a prior study in 06/2017, a TAVR valve is noted without obstruction or paravalvular leak - mean gradient of 15 mmHg is noted.  _____________  2D ECHO 09/13/17 ( 1 month s/p TAVR) Study Conclusions - Left ventricle: The cavity size was normal. There was mild   concentric hypertrophy. Systolic function was mildly reduced. The   estimated ejection fraction was in the range of 45% to 50%. Wall   motion was normal; there were no regional wall motion   abnormalities. Doppler parameters are consistent with abnormal   left ventricular relaxation (grade 1 diastolic dysfunction).   Doppler parameters are consistent with high ventricular filling   pressure. - Aortic valve: A 60m Edwards Sapien  TAVR bioprosthesis was   present. There was mild stenosis. There was mild perivalvular   regurgitation. Peak velocity (S): 296 cm/s. Mean gradient (S): 16   mm Hg. - Mitral valve: Transvalvular velocity was within the normal range.   There was no evidence for stenosis. There was no regurgitation. - Right ventricle: The cavity size was normal. Wall thickness was   normal. Systolic function was normal. - Atrial septum: No defect or patent foramen ovale was identified   by color flow Doppler.    ASSESSMENT & PLAN:   Severe AS s/p TAVR: he has NHYA class I symptoms. 2D ECHO today shows mild LV dysfunction (EF 45-50%) with mild AS with mean gradient of 16 mm Hg and mild PVL. Gradients are similar to POD #1 echo. SBE prophylaxis discussed. He can stop his plavix after 6 month of therapy ( mid June 2019 ).  I will see him back in 1 year with an echo.   Mild LV dysfunction: noted on echo today. EF 45-50%. Continue Lisinopril and Coreg.  DMT2: [place] [facility] [place] [facility]  HTN: BP with borderline control today. No changes made.   CAD: [place] and underwent multivessel stenting with DES. Will continue ASA/plavix, statin and BB.  Pancreatic Cancer/RCC: PET scan 12/19 showed no evidence of hypermetabolic metastatic disease. Plan is for chemo/XRt followed by surgical resection of his pancreatic mass and nephrectomy.   Hyperkalemia: K 5.4 on 12/27. Due for repeat BMET 12/4   Medication Adjustments/Labs and Tests Ordered: Current medicines are reviewed at length with the patient today.  Concerns regarding medicines are outlined above.  Medication changes, Labs and Tests ordered today are listed in the Patient Instructions below. Patient Instructions  Medication Instructions:  Your provider  recommends that you continue on your current medications as directed. Please refer to the Current  Medication list given to you today.    We will call you to instruct you to stop your Plavix in June.  Labwork: None  Testing/Procedures: Your provider has requested that you have an echocardiogram in [place]. Echocardiography is a painless test that uses sound waves to create images of your heart. It provides your doctor with information about the size and shape of your heart and how well your heart's chambers and valves are working. This procedure takes approximately one hour. There are no restrictions for this procedure.   Follow-Up: Your provider recommends that you schedule a follow-up appointment in 3 MONTHS with Dr. Domenic Polite.  Your provider wants you to follow-up in: 1 year with Nell Range, PA. You will receive a reminder letter in the mail two months in advance. If you don't receive a letter, please call our office to schedule the follow-up appointment.    Any Other Special Instructions Will Be Listed Below (If Applicable).     If you need a refill on your cardiac medications before your next appointment, please call your pharmacy.      Signed, Angelena Form, PA-C  09/14/2017 9:35 AM    [facility] [place], [place], [place]  [address] Phone: [phone]; Fax: [phone]

## 2017-09-13 ENCOUNTER — Encounter: Payer: Self-pay | Admitting: Nurse Practitioner

## 2017-09-13 ENCOUNTER — Other Ambulatory Visit: Payer: Self-pay

## 2017-09-13 ENCOUNTER — Ambulatory Visit (HOSPITAL_COMMUNITY): Payer: Medicare Other | Attending: Cardiovascular Disease

## 2017-09-13 ENCOUNTER — Encounter (HOSPITAL_BASED_OUTPATIENT_CLINIC_OR_DEPARTMENT_OTHER): Payer: Self-pay | Admitting: *Deleted

## 2017-09-13 ENCOUNTER — Ambulatory Visit (INDEPENDENT_AMBULATORY_CARE_PROVIDER_SITE_OTHER): Payer: Medicare Other | Admitting: Physician Assistant

## 2017-09-13 ENCOUNTER — Encounter: Payer: Self-pay | Admitting: Physician Assistant

## 2017-09-13 ENCOUNTER — Other Ambulatory Visit: Payer: Self-pay | Admitting: General Surgery

## 2017-09-13 VITALS — BP 146/70 | HR 69 | Ht 70.0 in | Wt 171.0 lb

## 2017-09-13 DIAGNOSIS — I350 Nonrheumatic aortic (valve) stenosis: Secondary | ICD-10-CM | POA: Diagnosis not present

## 2017-09-13 DIAGNOSIS — I10 Essential (primary) hypertension: Secondary | ICD-10-CM | POA: Insufficient documentation

## 2017-09-13 DIAGNOSIS — Z952 Presence of prosthetic heart valve: Secondary | ICD-10-CM | POA: Diagnosis not present

## 2017-09-13 DIAGNOSIS — K8689 Other specified diseases of pancreas: Secondary | ICD-10-CM

## 2017-09-13 DIAGNOSIS — E119 Type 2 diabetes mellitus without complications: Secondary | ICD-10-CM | POA: Diagnosis not present

## 2017-09-13 DIAGNOSIS — E875 Hyperkalemia: Secondary | ICD-10-CM

## 2017-09-13 DIAGNOSIS — K869 Disease of pancreas, unspecified: Secondary | ICD-10-CM

## 2017-09-13 DIAGNOSIS — E118 Type 2 diabetes mellitus with unspecified complications: Secondary | ICD-10-CM | POA: Diagnosis not present

## 2017-09-13 DIAGNOSIS — I2510 Atherosclerotic heart disease of native coronary artery without angina pectoris: Secondary | ICD-10-CM | POA: Diagnosis not present

## 2017-09-13 NOTE — Progress Notes (Signed)
Received PA request for Lidocaine-Prilocaine cream.  Completed via Cover My Meds:  Aroldo Riederer (Key: [id number])   Your information has been submitted to [place]. [employer] will review the request and notify you of the determination decision directly, typically within 3 business days of your submission and once all necessary information is received. You will also receive your request decision electronically. To check for an update later, open the request again from your dashboard. If [employer] [place] has not responded within the specified timeframe or if you have any questions about your PA submission, contact Judson Grano directly at [facility]) [phone] or ([place]) [phone].

## 2017-09-13 NOTE — Patient Instructions (Signed)
Medication Instructions:  Your provider recommends that you continue on your current medications as directed. Please refer to the Current Medication list given to you today.    We will call you to instruct you to stop your Plavix in June.  Labwork: None  Testing/Procedures: Your provider has requested that you have an echocardiogram in [place]. Echocardiography is a painless test that uses sound waves to create images of your heart. It provides your doctor with information about the size and shape of your heart and how well your heart's chambers and valves are working. This procedure takes approximately one hour. There are no restrictions for this procedure.   Follow-Up: Your provider recommends that you schedule a follow-up appointment in 3 MONTHS with Dr. Domenic Polite.  Your provider wants you to follow-up in: 1 year with Nell Range, PA. You will receive a reminder letter in the mail two months in advance. If you don't receive a letter, please call our office to schedule the follow-up appointment.    Any Other Special Instructions Will Be Listed Below (If Applicable).     If you need a refill on your cardiac medications before your next appointment, please call your pharmacy.

## 2017-09-13 NOTE — Progress Notes (Signed)
Chart reviewed by Dr Conrad [place]. Patient with hx STEMI 11-29-16, had DES x4. Pt also had TAVR 08-22-17 by Dr Cyndia Bent for severe AS. He is currently still on Plavix and baby ASA for stents. Dr Conrad [place] discussed this with Dr Barry Dienes who stated that it was OK with her for pt to remain on blood thinners for [facility] placement. Patient will come in for repeat BMET to recheck his K+ level from 09-07-17 that was 5.4.

## 2017-09-14 ENCOUNTER — Encounter: Payer: Self-pay | Admitting: Oncology

## 2017-09-14 ENCOUNTER — Encounter (HOSPITAL_BASED_OUTPATIENT_CLINIC_OR_DEPARTMENT_OTHER)
Admission: RE | Admit: 2017-09-14 | Discharge: 2017-09-14 | Disposition: A | Discharge status: Home / Self Care | Payer: Medicare Other | Source: Ambulatory Visit | Attending: General Surgery | Admitting: General Surgery

## 2017-09-14 ENCOUNTER — Telehealth: Payer: Self-pay | Admitting: *Deleted

## 2017-09-14 ENCOUNTER — Other Ambulatory Visit: Payer: Self-pay | Admitting: *Deleted

## 2017-09-14 DIAGNOSIS — I2510 Atherosclerotic heart disease of native coronary artery without angina pectoris: Secondary | ICD-10-CM | POA: Diagnosis not present

## 2017-09-14 DIAGNOSIS — K219 Gastro-esophageal reflux disease without esophagitis: Secondary | ICD-10-CM | POA: Diagnosis not present

## 2017-09-14 DIAGNOSIS — Z87891 Personal history of nicotine dependence: Secondary | ICD-10-CM | POA: Diagnosis not present

## 2017-09-14 DIAGNOSIS — E119 Type 2 diabetes mellitus without complications: Secondary | ICD-10-CM | POA: Diagnosis not present

## 2017-09-14 DIAGNOSIS — I252 Old myocardial infarction: Secondary | ICD-10-CM | POA: Diagnosis not present

## 2017-09-14 DIAGNOSIS — Z79899 Other long term (current) drug therapy: Secondary | ICD-10-CM | POA: Diagnosis not present

## 2017-09-14 DIAGNOSIS — Z7982 Long term (current) use of aspirin: Secondary | ICD-10-CM | POA: Diagnosis not present

## 2017-09-14 DIAGNOSIS — C259 Malignant neoplasm of pancreas, unspecified: Secondary | ICD-10-CM | POA: Diagnosis not present

## 2017-09-14 DIAGNOSIS — Z8673 Personal history of transient ischemic attack (TIA), and cerebral infarction without residual deficits: Secondary | ICD-10-CM | POA: Diagnosis not present

## 2017-09-14 DIAGNOSIS — E785 Hyperlipidemia, unspecified: Secondary | ICD-10-CM | POA: Diagnosis not present

## 2017-09-14 DIAGNOSIS — Z952 Presence of prosthetic heart valve: Secondary | ICD-10-CM | POA: Diagnosis not present

## 2017-09-14 DIAGNOSIS — I10 Essential (primary) hypertension: Secondary | ICD-10-CM | POA: Diagnosis not present

## 2017-09-14 DIAGNOSIS — Z794 Long term (current) use of insulin: Secondary | ICD-10-CM | POA: Diagnosis not present

## 2017-09-14 LAB — BASIC METABOLIC PANEL
Anion gap: 4 — ABNORMAL LOW (ref 5–15)
BUN: 17 mg/dL (ref 6–20)
CO2: 27 mmol/L (ref 22–32)
Calcium: 9.4 mg/dL (ref 8.9–10.3)
Chloride: 104 mmol/L (ref 101–111)
Creatinine, Ser: 1.1 mg/dL (ref 0.61–1.24)
GFR calc Af Amer: >60 mL/min (ref >60)
GFR calc non Af Amer: >60 mL/min (ref >60)
Glucose, Bld: 306 mg/dL — ABNORMAL HIGH (ref 65–99)
Potassium: 4.5 mmol/L (ref 3.5–5.1)
Sodium: 135 mmol/L (ref 135–145)

## 2017-09-14 NOTE — Progress Notes (Signed)
Received voicemail from [place] w/[employer] with PA determination for Lidocaine-Prilocaine cream.  PA approved 09/13/17-09/13/18.  [facility]) to advise of approval. He states it was processed and went through.

## 2017-09-14 NOTE — Progress Notes (Signed)
P[facility] Petrov KeyDonia Guiles - Rx #: [id number] Need help? Call us at [phone]  Outcome  Approvedon January 2  Effective from 09/13/2017 through 09/13/2018.  DrugLidocaine-Prilocaine 2.5-2.5% EX CREA  FormBlue Cross [place] Medicare Part [place] General Authorization Form  Original Claim [id number] (~~~~<<~~~~~~~~~~~~~~~

## 2017-09-14 NOTE — Progress Notes (Addendum)
Ensure pre surgery drink given with instructions to complete by [place], pt verbalized understanding.   Labs reviewed by Dr. Marcell Barlow, will proceed with surgery as scheduled.

## 2017-09-14 NOTE — Telephone Encounter (Signed)
Call from pt's wife to follow up on endocrinology referral. Pt would like to see an endocrinologist in [place]. [facility] scheduled appt for 09/21/17, wife notified.

## 2017-09-14 NOTE — Anesthesia Preprocedure Evaluation (Addendum)
Anesthesia Evaluation  Patient identified by MRN, date of birth, ID band Patient awake    Reviewed: Allergy & Precautions, NPO status , Patient's Chart, lab work & pertinent test results, reviewed documented beta blocker date and time   Airway Mallampati: II  TM Distance: >3 FB Neck ROM: Full    Dental  (+) Edentulous Upper, Edentulous Lower   Pulmonary former smoker,    breath sounds clear to auscultation       Cardiovascular hypertension, Pt. on medications and Pt. on home beta blockers + CAD, + Past MI and + Cardiac Stents  + Valvular Problems/Murmurs AS  Rhythm:Regular Rate:Normal  S/p TAVR 08/2017  TTE 09/13/16 - Mild concentric hypertrophy.EF was in the range of 45% to 50%. Grade 1 diastolic dysfunction.  24m Edwards Sapien TAVR bioprosthesis was present, mild AS and mild perivalvular regurgitation.    Neuro/Psych CVA, No Residual Symptoms negative psych ROS   GI/Hepatic Neg liver ROS, GERD  ,Mass of pancreatic head   Endo/Other  diabetes, Type 2  Renal/GU Renal mass     Musculoskeletal  (+) Arthritis ,   Abdominal   Peds  Hematology negative hematology ROS (+)   Anesthesia Other Findings   Reproductive/Obstetrics                            Lab Results  Component Value Date   WBC 7.9 09/07/2017   HGB 12.7 (L) 09/07/2017   HCT 38.7 09/07/2017   MCV 91.8 09/07/2017   PLT 161 09/07/2017   Lab Results  Component Value Date   CREATININE 1.10 09/14/2017   BUN 17 09/14/2017   NA 135 09/14/2017   K 4.5 09/14/2017   CL 104 09/14/2017   CO2 27 09/14/2017    Anesthesia Physical  Anesthesia Plan  ASA: IV  Anesthesia Plan: General   Post-op Pain Management:    Induction: Intravenous  PONV Risk Score and Plan: 2 and Ondansetron, Propofol infusion and Treatment may vary due to age or medical condition  Airway Management Planned: LMA  Additional Equipment: None  Intra-op  Plan:   Post-operative Plan: Extubation in OR  Informed Consent: I have reviewed the patients History and Physical, chart, labs and discussed the procedure including the risks, benefits and alternatives for the proposed anesthesia with the patient or authorized representative who has indicated his/her understanding and acceptance.     Plan Discussed with: CRNA  Anesthesia Plan Comments:         Anesthesia Quick Evaluation

## 2017-09-15 ENCOUNTER — Ambulatory Visit (HOSPITAL_COMMUNITY): Payer: Medicare Other

## 2017-09-15 ENCOUNTER — Ambulatory Visit (HOSPITAL_BASED_OUTPATIENT_CLINIC_OR_DEPARTMENT_OTHER): Payer: Medicare Other | Admitting: Anesthesiology

## 2017-09-15 ENCOUNTER — Encounter (HOSPITAL_BASED_OUTPATIENT_CLINIC_OR_DEPARTMENT_OTHER): Payer: Self-pay | Admitting: *Deleted

## 2017-09-15 ENCOUNTER — Other Ambulatory Visit: Payer: Medicare Other

## 2017-09-15 ENCOUNTER — Other Ambulatory Visit: Payer: Self-pay

## 2017-09-15 ENCOUNTER — Ambulatory Visit (HOSPITAL_BASED_OUTPATIENT_CLINIC_OR_DEPARTMENT_OTHER)
Admission: RE | Admit: 2017-09-15 | Discharge: 2017-09-15 | Disposition: A | Discharge status: Home / Self Care | Payer: Medicare Other | Source: Ambulatory Visit | Attending: General Surgery | Admitting: General Surgery

## 2017-09-15 ENCOUNTER — Telehealth: Payer: Self-pay | Admitting: *Deleted

## 2017-09-15 ENCOUNTER — Encounter (HOSPITAL_BASED_OUTPATIENT_CLINIC_OR_DEPARTMENT_OTHER)
Admission: RE | Disposition: A | Discharge status: Home / Self Care | Payer: Self-pay | Source: Ambulatory Visit | Attending: General Surgery

## 2017-09-15 DIAGNOSIS — Z452 Encounter for adjustment and management of vascular access device: Secondary | ICD-10-CM | POA: Diagnosis not present

## 2017-09-15 DIAGNOSIS — C259 Malignant neoplasm of pancreas, unspecified: Secondary | ICD-10-CM | POA: Insufficient documentation

## 2017-09-15 DIAGNOSIS — I10 Essential (primary) hypertension: Secondary | ICD-10-CM | POA: Diagnosis not present

## 2017-09-15 DIAGNOSIS — Z952 Presence of prosthetic heart valve: Secondary | ICD-10-CM | POA: Insufficient documentation

## 2017-09-15 DIAGNOSIS — Z7982 Long term (current) use of aspirin: Secondary | ICD-10-CM | POA: Insufficient documentation

## 2017-09-15 DIAGNOSIS — Z794 Long term (current) use of insulin: Secondary | ICD-10-CM | POA: Insufficient documentation

## 2017-09-15 DIAGNOSIS — E785 Hyperlipidemia, unspecified: Secondary | ICD-10-CM | POA: Diagnosis not present

## 2017-09-15 DIAGNOSIS — Z79899 Other long term (current) drug therapy: Secondary | ICD-10-CM | POA: Diagnosis not present

## 2017-09-15 DIAGNOSIS — K219 Gastro-esophageal reflux disease without esophagitis: Secondary | ICD-10-CM | POA: Insufficient documentation

## 2017-09-15 DIAGNOSIS — I252 Old myocardial infarction: Secondary | ICD-10-CM | POA: Diagnosis not present

## 2017-09-15 DIAGNOSIS — I2510 Atherosclerotic heart disease of native coronary artery without angina pectoris: Secondary | ICD-10-CM | POA: Insufficient documentation

## 2017-09-15 DIAGNOSIS — Z8673 Personal history of transient ischemic attack (TIA), and cerebral infarction without residual deficits: Secondary | ICD-10-CM | POA: Diagnosis not present

## 2017-09-15 DIAGNOSIS — Z87891 Personal history of nicotine dependence: Secondary | ICD-10-CM | POA: Insufficient documentation

## 2017-09-15 DIAGNOSIS — E119 Type 2 diabetes mellitus without complications: Secondary | ICD-10-CM | POA: Diagnosis not present

## 2017-09-15 DIAGNOSIS — Z419 Encounter for procedure for purposes other than remedying health state, unspecified: Secondary | ICD-10-CM

## 2017-09-15 DIAGNOSIS — Z4682 Encounter for fitting and adjustment of non-vascular catheter: Secondary | ICD-10-CM | POA: Diagnosis not present

## 2017-09-15 DIAGNOSIS — C250 Malignant neoplasm of head of pancreas: Secondary | ICD-10-CM | POA: Diagnosis not present

## 2017-09-15 HISTORY — PX: PORTACATH PLACEMENT: SHX2246

## 2017-09-15 LAB — GLUCOSE, CAPILLARY
Glucose-Capillary: 293 mg/dL — ABNORMAL HIGH (ref 65–99)
Glucose-Capillary: 230 mg/dL — ABNORMAL HIGH (ref 65–99)

## 2017-09-15 SURGERY — INSERTION, TUNNELED CENTRAL VENOUS DEVICE, WITH PORT
Anesthesia: General | Site: Chest

## 2017-09-15 MED ORDER — BUPIVACAINE-EPINEPHRINE (PF) 0.5% -1:200000 IJ SOLN
INTRAMUSCULAR | Status: AC
  Filled 2017-09-15: qty 30

## 2017-09-15 MED ORDER — HEPARIN (PORCINE) IN NACL 2-0.9 UNIT/ML-% IJ SOLN
INTRAMUSCULAR | Status: AC
  Filled 2017-09-15: qty 500

## 2017-09-15 MED ORDER — LIDOCAINE-EPINEPHRINE (PF) 1 %-1:200000 IJ SOLN
INTRAMUSCULAR | Status: AC
  Filled 2017-09-15: qty 30

## 2017-09-15 MED ORDER — FENTANYL CITRATE (PF) 100 MCG/2ML IJ SOLN: 50.0000 ug | INTRAMUSCULAR | Status: DC | PRN

## 2017-09-15 MED ORDER — HEPARIN SOD (PORK) LOCK FLUSH 100 UNIT/ML IV SOLN
INTRAVENOUS | Status: DC | PRN
Start: 2017-09-15 — End: 2017-09-15
  Administered 2017-09-15: 500 [IU] via INTRAVENOUS

## 2017-09-15 MED ORDER — HEPARIN SOD (PORK) LOCK FLUSH 100 UNIT/ML IV SOLN
INTRAVENOUS | Status: AC
  Filled 2017-09-15: qty 5

## 2017-09-15 MED ORDER — LIDOCAINE 2% (20 MG/ML) 5 ML SYRINGE
INTRAMUSCULAR | Status: AC
  Filled 2017-09-15: qty 5

## 2017-09-15 MED ORDER — LACTATED RINGERS IV SOLN
INTRAVENOUS | Status: DC
  Administered 2017-09-15: 07:00:00 via INTRAVENOUS

## 2017-09-15 MED ORDER — EPHEDRINE SULFATE 50 MG/ML IJ SOLN
INTRAMUSCULAR | Status: DC | PRN
  Administered 2017-09-15: 10 mg via INTRAVENOUS

## 2017-09-15 MED ORDER — SCOPOLAMINE 1 MG/3DAYS TD PT72: 1.0000 | MEDICATED_PATCH | Freq: Once | TRANSDERMAL | Status: DC | PRN

## 2017-09-15 MED ORDER — LIDOCAINE HCL (CARDIAC) 20 MG/ML IV SOLN
INTRAVENOUS | Status: DC | PRN
  Administered 2017-09-15: 80 mg via INTRAVENOUS

## 2017-09-15 MED ORDER — PHENYLEPHRINE HCL 10 MG/ML IJ SOLN
INTRAMUSCULAR | Status: DC | PRN
  Administered 2017-09-15 (×2): 80 ug via INTRAVENOUS
  Administered 2017-09-15: 120 ug via INTRAVENOUS
  Administered 2017-09-15: 80 ug via INTRAVENOUS

## 2017-09-15 MED ORDER — CEFAZOLIN SODIUM-DEXTROSE 2-4 GM/100ML-% IV SOLN
2.0000 g | INTRAVENOUS | Status: AC
  Administered 2017-09-15: 2 g via INTRAVENOUS

## 2017-09-15 MED ORDER — ONDANSETRON HCL 4 MG/2ML IJ SOLN: 4.0000 mg | Freq: Once | INTRAMUSCULAR | Status: DC | PRN

## 2017-09-15 MED ORDER — FENTANYL CITRATE (PF) 100 MCG/2ML IJ SOLN
INTRAMUSCULAR | Status: DC | PRN
  Administered 2017-09-15 (×2): 25 ug via INTRAVENOUS

## 2017-09-15 MED ORDER — BUPIVACAINE HCL (PF) 0.25 % IJ SOLN
INTRAMUSCULAR | Status: AC
  Filled 2017-09-15: qty 30

## 2017-09-15 MED ORDER — CEFAZOLIN SODIUM-DEXTROSE 2-4 GM/100ML-% IV SOLN
INTRAVENOUS | Status: AC
  Filled 2017-09-15: qty 100

## 2017-09-15 MED ORDER — GABAPENTIN 300 MG PO CAPS
ORAL_CAPSULE | ORAL | Status: AC
  Filled 2017-09-15: qty 1

## 2017-09-15 MED ORDER — HEPARIN (PORCINE) IN NACL 2-0.9 UNIT/ML-% IJ SOLN
INTRAMUSCULAR | Status: AC | PRN
  Administered 2017-09-15: 15 mL via INTRAVENOUS

## 2017-09-15 MED ORDER — ONDANSETRON HCL 4 MG/2ML IJ SOLN
INTRAMUSCULAR | Status: DC | PRN
  Administered 2017-09-15: 4 mg via INTRAVENOUS

## 2017-09-15 MED ORDER — PROPOFOL 10 MG/ML IV BOLUS
INTRAVENOUS | Status: DC | PRN
  Administered 2017-09-15: 120 mg via INTRAVENOUS

## 2017-09-15 MED ORDER — MIDAZOLAM HCL 2 MG/2ML IJ SOLN: 1.0000 mg | INTRAMUSCULAR | Status: DC | PRN

## 2017-09-15 MED ORDER — FENTANYL CITRATE (PF) 100 MCG/2ML IJ SOLN: 25.0000 ug | INTRAMUSCULAR | Status: DC | PRN

## 2017-09-15 MED ORDER — FENTANYL CITRATE (PF) 100 MCG/2ML IJ SOLN
INTRAMUSCULAR | Status: AC
  Filled 2017-09-15: qty 2

## 2017-09-15 MED ORDER — ACETAMINOPHEN 500 MG PO TABS
1000.0000 mg | ORAL_TABLET | ORAL | Status: AC
  Administered 2017-09-15: 1000 mg via ORAL

## 2017-09-15 MED ORDER — CHLORHEXIDINE GLUCONATE CLOTH 2 % EX PADS: 6.0000 | MEDICATED_PAD | Freq: Once | CUTANEOUS | Status: DC

## 2017-09-15 MED ORDER — DEXAMETHASONE SODIUM PHOSPHATE 10 MG/ML IJ SOLN
INTRAMUSCULAR | Status: AC
Start: 2017-09-15 — End: ?
  Filled 2017-09-15: qty 1

## 2017-09-15 MED ORDER — OXYCODONE HCL 5 MG PO TABS: 2.5000 mg | ORAL_TABLET | Freq: Four times a day (QID) | ORAL | 0 refills | Status: DC | PRN

## 2017-09-15 MED ORDER — BUPIVACAINE-EPINEPHRINE 0.5% -1:200000 IJ SOLN
INTRAMUSCULAR | Status: DC | PRN
  Administered 2017-09-15: 17 mL

## 2017-09-15 MED ORDER — GABAPENTIN 300 MG PO CAPS
300.0000 mg | ORAL_CAPSULE | ORAL | Status: AC
  Administered 2017-09-15: 300 mg via ORAL

## 2017-09-15 MED ORDER — ONDANSETRON HCL 4 MG/2ML IJ SOLN
INTRAMUSCULAR | Status: AC
Start: 2017-09-15 — End: ?
  Filled 2017-09-15: qty 2

## 2017-09-15 MED ORDER — ACETAMINOPHEN 500 MG PO TABS
ORAL_TABLET | ORAL | Status: AC
  Filled 2017-09-15: qty 2

## 2017-09-15 MED ORDER — PROPOFOL 10 MG/ML IV BOLUS
INTRAVENOUS | Status: AC
  Filled 2017-09-15: qty 20

## 2017-09-15 SURGICAL SUPPLY — 40 items
BAG DECANTER FOR FLEXI CONT (MISCELLANEOUS) ×2 IMPLANT
BLADE HEX COATED 2.75 (ELECTRODE) ×2 IMPLANT
BLADE SURG 11 STRL SS (BLADE) ×2 IMPLANT
BLADE SURG 15 STRL LF DISP TIS (BLADE) ×1 IMPLANT
BLADE SURG 15 STRL SS (BLADE) ×1
CHLORAPREP W/TINT 26ML (MISCELLANEOUS) ×2 IMPLANT
COVER BACK TABLE 60X90IN (DRAPES) ×2 IMPLANT
COVER MAYO STAND STRL (DRAPES) ×2 IMPLANT
DECANTER SPIKE VIAL GLASS SM (MISCELLANEOUS) IMPLANT
DERMABOND ADVANCED (GAUZE/BANDAGES/DRESSINGS) ×1
DERMABOND ADVANCED .7 DNX12 (GAUZE/BANDAGES/DRESSINGS) ×1 IMPLANT
DRAPE C-ARM 42X72 X-RAY (DRAPES) ×2 IMPLANT
DRAPE LAPAROTOMY TRNSV 102X78 (DRAPE) ×2 IMPLANT
DRAPE UTILITY XL STRL (DRAPES) ×2 IMPLANT
DRSG TEGADERM 4X4.75 (GAUZE/BANDAGES/DRESSINGS) IMPLANT
ELECT REM PT RETURN 9FT ADLT (ELECTROSURGICAL) ×2
ELECTRODE REM PT RTRN 9FT ADLT (ELECTROSURGICAL) ×1 IMPLANT
GAUZE SPONGE 4X4 12PLY STRL LF (GAUZE/BANDAGES/DRESSINGS) IMPLANT
GLOVE BIO SURGEON STRL SZ 6 (GLOVE) ×2 IMPLANT
GLOVE BIOGEL PI IND STRL 6.5 (GLOVE) ×1 IMPLANT
GLOVE BIOGEL PI INDICATOR 6.5 (GLOVE) ×1
GOWN STRL REUS W/ TWL LRG LVL3 (GOWN DISPOSABLE) ×1 IMPLANT
GOWN STRL REUS W/TWL 2XL LVL3 (GOWN DISPOSABLE) ×2 IMPLANT
GOWN STRL REUS W/TWL LRG LVL3 (GOWN DISPOSABLE) ×1
IV CONNECTOR ONE LINK NDLESS (IV SETS) IMPLANT
KIT PORT POWER 8FR ISP CVUE (Miscellaneous) ×2 IMPLANT
NEEDLE HYPO 25X1 1.5 SAFETY (NEEDLE) ×2 IMPLANT
PACK BASIN DAY SURGERY FS (CUSTOM PROCEDURE TRAY) ×2 IMPLANT
PENCIL BUTTON HOLSTER BLD 10FT (ELECTRODE) ×2 IMPLANT
SLEEVE SCD COMPRESS KNEE MED (MISCELLANEOUS) ×2 IMPLANT
SUT MNCRL AB 4-0 PS2 18 (SUTURE) ×2 IMPLANT
SUT PROLENE 2 0 SH DA (SUTURE) ×4 IMPLANT
SUT VIC AB 3-0 SH 27 (SUTURE) ×1
SUT VIC AB 3-0 SH 27X BRD (SUTURE) ×1 IMPLANT
SUT VICRYL 3-0 CR8 SH (SUTURE) IMPLANT
SYR 10ML LL (SYRINGE) ×2 IMPLANT
SYR 5ML LUER SLIP (SYRINGE) ×2 IMPLANT
SYR CONTROL 10ML LL (SYRINGE) ×2 IMPLANT
TOWEL OR 17X24 6PK STRL BLUE (TOWEL DISPOSABLE) ×2 IMPLANT
TOWEL OR NON WOVEN STRL DISP B (DISPOSABLE) ×2 IMPLANT

## 2017-09-15 NOTE — Op Note (Signed)
PREOPERATIVE DIAGNOSIS:  Pancreas cancer     POSTOPERATIVE DIAGNOSIS:  Same     PROCEDURE: left subclavian port placement, Bard ClearVue  Power Port, MRI safe, 8-French.      SURGEON:  [name], MD      ANESTHESIA:  General   FINDINGS:  Good venous return, easy flush, and tip of the catheter and   SVC 25 cm.      SPECIMEN:  None.      ESTIMATED BLOOD LOSS:  Minimal.      COMPLICATIONS:  None known.      PROCEDURE:  Pt was identified in the holding area and taken to   the operating room, where patient was placed supine on the operating room   table.  General anesthesia was induced.  Patient's arms were tucked and the upper   chest and neck were prepped and draped in sterile fashion.  Time-out was   performed according to the surgical safety check list.  When all was   correct, we continued.   Local anesthetic was administered over this   area at the angle of the clavicle.  The vein was accessed with 1 pass(es) of the needle. There was good venous return and the wire passed easily with no ectopy.   Fluoroscopy was used to confirm that the wire was in the vena cava.      The patient was placed back level and the area for the pocket was anethetized   with local anesthetic.  A 3-cm transverse incision was made with a #15   blade.  Cautery was used to divide the subcutaneous tissues down to the   pectoralis muscle.  An [employer]-Nav[employer] retractor was used to elevate the skin   while a pocket was created on top of the pectoralis fascia.  The port   was placed into the pocket to confirm that it was of adequate size.  The   catheter was preattached to the port.  The port was then secured to the   pectoralis fascia with four 2-0 Prolene sutures.  These were clamped and   not tied down yet.    The catheter was tunneled through to the wire exit   site.  The catheter was placed along the wire to determine what length it should be to be in the SVC.  The catheter was cut at 25 cm.  The tunneler  sheath and dilator were passed over the wire and the dilator and wire were removed.  The catheter was advanced through the tunneler sheath and the tunneler sheath was pulled away.  Care was taken to keep the catheter in the tunneler sheath as this occurred. This was advanced and the tunneler sheath was removed.  There was good venous   return and easy flush of the catheter.  The Prolene sutures were tied   down to the pectoral fascia.  The skin was reapproximated using 3-0   Vicryl interrupted deep dermal sutures.    Fluoroscopy was used to re-confirm good position of the catheter.  The skin   was then closed using 4-0 Monocryl in a subcuticular fashion.  The port was flushed with concentrated heparin flush as well.  The wounds were then cleaned, dried, and dressed with Dermabond.  The patient was awakened from anesthesia and taken to the PACU in stable condition.  Needle, sponge, and instrument counts were correct.               [name], MD

## 2017-09-15 NOTE — Telephone Encounter (Signed)
OK to treat with CBC/CMET from 12/27, per Dr. Benay Spice. First treatment 09/16/17.

## 2017-09-15 NOTE — Anesthesia Postprocedure Evaluation (Signed)
Anesthesia Post Note  Patient: Manuel Olson  Procedure(s) Performed: INSERTION PORT-A-CATH (N/A Chest)     Patient location during evaluation: PACU Anesthesia Type: General Level of consciousness: awake and alert Pain management: pain level controlled Vital Signs Assessment: post-procedure vital signs reviewed and stable Respiratory status: spontaneous breathing, nonlabored ventilation and respiratory function stable Cardiovascular status: blood pressure returned to baseline and stable Postop Assessment: no apparent nausea or vomiting Anesthetic complications: no    Last Vitals:  Vitals:   09/15/17 0915 09/15/17 0945  BP: 137/70 (!) 173/74  Pulse: 63 60  Resp: 11 18  Temp:    SpO2: 97% 97%    Last Pain:  Vitals:   09/15/17 0945  TempSrc:   PainSc: 0-No pain                 Audry Pili

## 2017-09-15 NOTE — H&P (Signed)
Manuel Olson is an 72 y.o. male.   Chief Complaint: pancreas cancer HPI:  Pt is a 72 yo M who presented with incidentally discovered pancreatic cancer when undergoing scanning for his aortic valve dysfunction.  He has now had a TAVR and is ready for chemo.    Past Medical History:  Diagnosis Date  . Arthritis   . CAD (coronary artery disease)    a. 11/2016 in [place]: STEMI s/p DES to mid LAD, DES to diagonal, DES x 2 to proximal and mid RCA   . Essential hypertension   . Former smoker   . GERD (gastroesophageal reflux disease)   . Hyperlipidemia   . Pancreatic cancer (HCC)   . Renal mass   . S/P TAVR (transcatheter aortic valve replacement)    a. 08/2017:  Edwards Sapien 3 THV (size 26 mm, model #9600CM26A , serial # [id number])  . Severe aortic stenosis    a. 08/2017: s/p TAVR by Dr. McAlhany and Dr. Bartle.   . 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 Type 2 diabetes mellitus (HCC)     Past Surgical History:  Procedure Laterality Date  . APPENDECTOMY    . EUS N/A 08/02/2017   Procedure: UPPER ENDOSCOPIC ULTRASOUND (EUS) RADIAL;  Surgeon: Jacobs, Daniel P, MD;  Location: [facility];  Service: Endoscopy;  Laterality: N/A;  . EYE SURGERY Left   . HAND SURGERY Bilateral   . HERNIA REPAIR Right   . RIGHT/LEFT HEART CATH AND CORONARY ANGIOGRAPHY N/A 07/05/2017   Procedure: RIGHT/LEFT HEART CATH AND CORONARY ANGIOGRAPHY;  Surgeon: Cooper, Michael, MD;  Location: [facility];  Service: Cardiovascular;  Laterality: N/A;  . SHOULDER ARTHROSCOPY WITH ROTATOR CUFF REPAIR Left   . TEE WITHOUT CARDIOVERSION N/A 08/22/2017   Procedure: TRANSESOPHAGEAL ECHOCARDIOGRAM (TEE);  Surgeon: McAlhany, Christopher D, MD;  Location: [facility] OR;  Service: Open Heart Surgery;  Laterality: N/A;  . TRANSCATHETER AORTIC VALVE REPLACEMENT, TRANSFEMORAL N/A 08/22/2017   Procedure: TRANSCATHETER AORTIC VALVE REPLACEMENT, TRANSFEMORAL;  Surgeon: McAlhany, Christopher D, MD;  Location: [facility] OR;  Service: Open  Heart Surgery;  Laterality: N/A;    Family History  Problem Relation Age of Onset  . Lupus Mother   . CAD Brother   . Hypertension Brother    Social History:  reports that he quit smoking about 17 months ago. His smoking use included cigarettes. He started smoking about 59 years ago. He has a 57.00 pack-year smoking history. he has never used smokeless tobacco. He reports that he does not drink alcohol or use drugs.  Allergies: No Known Allergies  Medications Prior to Admission  Medication Sig Dispense Refill  . aspirin EC 81 MG tablet Take 1 tablet (81 mg total) by mouth daily. 90 tablet 3  . atorvastatin (LIPITOR) 20 MG tablet Take 1 tablet (20 mg total) by mouth daily. 30 tablet 6  . carvedilol (COREG) 3.125 MG tablet TAKE (1) TABLET BY MOUTH TWICE A DAY WITH MEALS (BREAKFAST AND SUPPER) 180 tablet 2  . clopidogrel (PLAVIX) 75 MG tablet Take 1 tablet (75 mg total) by mouth daily. 90 tablet 0  . insulin degludec (TRESIBA) 100 UNIT/ML SOPN FlexTouch Pen Inject 0.4 mLs (40 Units total) into the skin daily at 10 pm. 5 pen 2  . lisinopril (PRINIVIL,ZESTRIL) 10 MG tablet TAKE 1 TABLET BY MOUTH ONCE DAILY. 90 tablet 2  . Blood Glucose Monitoring Suppl (ONETOUCH VERIO) w/Device KIT 1 each by Does not apply route as needed. 1 kit   0  . glucose blood (ONETOUCH VERIO) test strip Use as instructed 150 each 2  . insulin aspart (FIASP FLEXTOUCH) 100 UNIT/ML FlexPen Inject 10-16 Units into the skin 3 (three) times daily with meals. 5 pen 2  . Insulin Pen Needle (B-D ULTRAFINE III SHORT PEN) 31G X 8 MM MISC 1 each by Does not apply route as directed. 100 each 3  . lidocaine-prilocaine (EMLA) cream Apply 1 application topically as needed. Apply small amount of cream over port site 1-2 hrs prior to chemo, cover with plastic wrap. 30 g 0  . prochlorperazine (COMPAZINE) 5 MG tablet Take 1 tablet (5 mg total) by mouth every 6 (six) hours as needed for nausea or vomiting. 30 tablet 0    Results for orders  placed or performed during the hospital encounter of 09/15/17 (from the past 48 hour(s))  Basic metabolic panel     Status: Abnormal   Collection Time: 09/14/17 12:15 PM  Result Value Ref Range   Sodium 135 135 - 145 mmol/L   Potassium 4.5 3.5 - 5.1 mmol/L   Chloride 104 101 - 111 mmol/L   CO2 27 22 - 32 mmol/L   Glucose, Bld 306 (H) 65 - 99 mg/dL   BUN 17 6 - 20 mg/dL   Creatinine, Ser 1.10 0.61 - 1.24 mg/dL   Calcium 9.4 8.9 - 10.3 mg/dL   GFR calc non Af Amer >60 >60 mL/min   GFR calc Af Amer >60 >60 mL/min    Comment: (NOTE) The eGFR has been calculated using the CKD EPI equation. This calculation has not been validated in all clinical situations. eGFR's persistently <60 mL/min signify possible Chronic Kidney Disease.    Anion gap 4 (L) 5 - 15  Glucose, capillary     Status: Abnormal   Collection Time: 09/15/17  7:00 AM  Result Value Ref Range   Glucose-Capillary 293 (H) 65 - 99 mg/dL   No results found.  Review of Systems  All other systems reviewed and are negative.   Blood pressure (!) 143/55, pulse 64, temperature 97.7 F (36.5 C), temperature source Oral, resp. rate 16, height 5' 10" (1.778 m), weight 77.1 kg (170 lb), SpO2 96 %. Physical Exam  Constitutional: He is oriented to person, place, and time. He appears well-developed and well-nourished. No distress.  HENT:  Head: Normocephalic.  Eyes: Conjunctivae are normal. Pupils are equal, round, and reactive to light. No scleral icterus.  Neck: Normal range of motion. No tracheal deviation present.  Cardiovascular: Normal rate.  Respiratory: Effort normal. No respiratory distress.  GI: Soft.  Musculoskeletal: Normal range of motion.  Neurological: He is alert and oriented to person, place, and time.  Skin: Skin is warm and dry. He is not diaphoretic.  Psychiatric: He has a normal mood and affect. His behavior is normal. Judgment and thought content normal.     Assessment/Plan Pancreas cancer Plan port and  neoadjuvant chemo. Discussed port  [name], MD 09/15/2017, 7:37 AM

## 2017-09-15 NOTE — Discharge Instructions (Signed)
No shower until access tubing removed after first dose chemo is completed.     [place] Office Phone Number [phone]   POST OP INSTRUCTIONS  Always review your discharge instruction sheet given to you by the facility where your surgery was performed.  IF YOU HAVE DISABILITY OR FAMILY LEAVE FORMS, YOU MUST BRING THEM TO THE OFFICE FOR PROCESSING.  DO NOT GIVE THEM TO YOUR DOCTOR.  1. A prescription for pain medication may be given to you upon discharge.  Take your pain medication as prescribed, if needed.  If narcotic pain medicine is not needed, then you may take acetaminophen (Tylenol) or ibuprofen (Advil) as needed. 2. Take your usually prescribed medications unless otherwise directed 3. If you need a refill on your pain medication, please contact your pharmacy.  They will contact our office to request authorization.  Prescriptions will not be filled after 5pm or on week-ends. 4. You should eat very light the first 24 hours after surgery, such as soup, crackers, pudding, etc.  Resume your normal diet the day after surgery 5. It is common to experience some constipation if taking pain medication after surgery.  Increasing fluid intake and taking a stool softener will usually help or prevent this problem from occurring.  A mild laxative (Milk of Magnesia or Miralax) should be taken according to package directions if there are no bowel movements after 48 hours. 6. You may shower in 48 hours.  The surgical glue will flake off in 2-3 weeks.   7. ACTIVITIES:  No strenuous activity or heavy lifting for 1 week.   a. You may drive when you no longer are taking prescription pain medication, you can comfortably wear a seatbelt, and you can safely maneuver your car and apply brakes. b. RETURN TO WORK:  __________TBD_______________ Dennis Bast should see your doctor in the office for a follow-up appointment approximately three-four weeks after your surgery.    WHEN TO CALL YOUR  DOCTOR: 1. Fever over 101.0 2. Nausea and/or vomiting. 3. Extreme swelling or bruising. 4. Continued bleeding from incision. 5. Increased pain, redness, or drainage from the incision.  The clinic staff is available to answer your questions during regular business hours.  Please dont hesitate to call and ask to speak to one of the nurses for clinical concerns.  If you have a medical emergency, go to the nearest emergency room or call 911.  A surgeon from [facility] is always on call at the hospital.  For further questions, please visit [email or web address]

## 2017-09-15 NOTE — Transfer of Care (Signed)
Immediate Anesthesia Transfer of Care Note  Patient: Manuel Olson  Procedure(s) Performed: INSERTION PORT-A-CATH (N/A Chest)  Patient Location: PACU  Anesthesia Type:General  Level of Consciousness: awake, alert , oriented and patient cooperative  Airway & Oxygen Therapy: Patient Spontanous Breathing and Patient connected to face mask oxygen  Post-op Assessment: Report given to RN and Post -op Vital signs reviewed and stable  Post vital signs: Reviewed and stable  Last Vitals:  Vitals:   09/15/17 0650  BP: (!) 143/55  Pulse: 64  Resp: 16  Temp: 36.5 C  SpO2: 96%    Last Pain:  Vitals:   09/15/17 0650  TempSrc: Oral         Complications: No apparent anesthesia complications

## 2017-09-16 ENCOUNTER — Ambulatory Visit (HOSPITAL_BASED_OUTPATIENT_CLINIC_OR_DEPARTMENT_OTHER): Payer: Medicare Other

## 2017-09-16 VITALS — BP 144/59 | HR 72

## 2017-09-16 DIAGNOSIS — C250 Malignant neoplasm of head of pancreas: Secondary | ICD-10-CM

## 2017-09-16 DIAGNOSIS — Z5111 Encounter for antineoplastic chemotherapy: Secondary | ICD-10-CM | POA: Diagnosis present

## 2017-09-16 MED ORDER — SODIUM CHLORIDE 0.9 % IV SOLN
Freq: Once | INTRAVENOUS | Status: AC
  Administered 2017-09-16: 09:00:00 via INTRAVENOUS
  Filled 2017-09-16: qty 5

## 2017-09-16 MED ORDER — PALONOSETRON HCL INJECTION 0.25 MG/5ML
INTRAVENOUS | Status: AC
  Filled 2017-09-16: qty 5

## 2017-09-16 MED ORDER — PALONOSETRON HCL INJECTION 0.25 MG/5ML
0.2500 mg | Freq: Once | INTRAVENOUS | Status: AC
  Administered 2017-09-16: 0.25 mg via INTRAVENOUS

## 2017-09-16 MED ORDER — FLUOROURACIL CHEMO INJECTION 5 GM/100ML
2400.0000 mg/m2 | INTRAVENOUS | Status: DC
  Administered 2017-09-16: 4700 mg via INTRAVENOUS
  Filled 2017-09-16: qty 94

## 2017-09-16 MED ORDER — SODIUM CHLORIDE 0.9 % IV SOLN
400.0000 mg/m2 | Freq: Once | INTRAVENOUS | Status: AC
  Administered 2017-09-16: 784 mg via INTRAVENOUS
  Filled 2017-09-16: qty 39.2

## 2017-09-16 MED ORDER — DEXTROSE 5 % IV SOLN
Freq: Once | INTRAVENOUS | Status: AC
  Administered 2017-09-16: 09:00:00 via INTRAVENOUS

## 2017-09-16 MED ORDER — SODIUM CHLORIDE 0.9 % IV SOLN
150.0000 mg/m2 | Freq: Once | INTRAVENOUS | Status: AC
  Administered 2017-09-16: 300 mg via INTRAVENOUS
  Filled 2017-09-16: qty 15

## 2017-09-16 MED ORDER — DEXTROSE 5 % IV SOLN
85.0000 mg/m2 | Freq: Once | INTRAVENOUS | Status: AC
  Administered 2017-09-16: 165 mg via INTRAVENOUS
  Filled 2017-09-16: qty 33

## 2017-09-16 NOTE — Patient Instructions (Signed)
[facility] Discharge Instructions for Patients Receiving Chemotherapy  Today you received the following chemotherapy agents: Oxaliplatin, Leucovorin, Irinotecan, Adrucil   To help prevent nausea and vomiting after your treatment, we encourage you to take your nausea medication as directed.    If you develop nausea and vomiting that is not controlled by your nausea medication, call the clinic.   BELOW ARE SYMPTOMS THAT SHOULD BE REPORTED IMMEDIATELY:  *FEVER GREATER THAN 100.5 F  *CHILLS WITH OR WITHOUT FEVER  NAUSEA AND VOMITING THAT IS NOT CONTROLLED WITH YOUR NAUSEA MEDICATION  *UNUSUAL SHORTNESS OF BREATH  *UNUSUAL BRUISING OR BLEEDING  TENDERNESS IN MOUTH AND THROAT WITH OR WITHOUT PRESENCE OF ULCERS  *URINARY PROBLEMS  *BOWEL PROBLEMS  UNUSUAL RASH Items with * indicate a potential emergency and should be followed up as soon as possible.  Feel free to call the clinic should you have any questions or concerns. The clinic phone number is [phone].  Please show the [facility] at check-in to the Emergency Department and triage nurse.    Oxaliplatin Injection What is this medicine? OXALIPLATIN (ox AL i PLA tin) is a chemotherapy drug. It targets fast dividing cells, like cancer cells, and causes these cells to die. This medicine is used to treat cancers of the colon and rectum, and many other cancers. This medicine may be used for other purposes; ask your health care provider or pharmacist if you have questions. COMMON BRAND NAME(S): Eloxatin What should I tell my health care provider before I take this medicine? They need to know if you have any of these conditions: -kidney disease -an unusual or allergic reaction to oxaliplatin, other chemotherapy, other medicines, foods, dyes, or preservatives -pregnant or trying to get pregnant -breast-feeding How should I use this medicine? This drug is given as an infusion into a vein. It is  administered in a hospital or clinic by a specially trained health care professional. Talk to your pediatrician regarding the use of this medicine in children. Special care may be needed. Overdosage: If you think you have taken too much of this medicine contact a poison control center or emergency room at once. NOTE: This medicine is only for you. Do not share this medicine with others. What if I miss a dose? It is important not to miss a dose. Call your doctor or health care professional if you are unable to keep an appointment. What may interact with this medicine? -medicines to increase blood counts like filgrastim, pegfilgrastim, sargramostim -probenecid -some antibiotics like amikacin, gentamicin, neomycin, polymyxin B, streptomycin, tobramycin -zalcitabine Talk to your doctor or health care professional before taking any of these medicines: -acetaminophen -aspirin -ibuprofen -ketoprofen -naproxen This list may not describe all possible interactions. Give your health care provider a list of all the medicines, herbs, non-prescription drugs, or dietary supplements you use. Also tell them if you smoke, drink alcohol, or use illegal drugs. Some items may interact with your medicine. What should I watch for while using this medicine? Your condition will be monitored carefully while you are receiving this medicine. You will need important blood work done while you are taking this medicine. This medicine can make you more sensitive to cold. Do not drink cold drinks or use ice. Cover exposed skin before coming in contact with cold temperatures or cold objects. When out in cold weather wear warm clothing and cover your mouth and nose to warm the air that goes into your lungs. Tell your doctor if you get  sensitive to the cold. This drug may make you feel generally unwell. This is not uncommon, as chemotherapy can affect healthy cells as well as cancer cells. Report any side effects. Continue your  course of treatment even though you feel ill unless your doctor tells you to stop. In some cases, you may be given additional medicines to help with side effects. Follow all directions for their use. Call your doctor or health care professional for advice if you get a fever, chills or sore throat, or other symptoms of a cold or flu. Do not treat yourself. This drug decreases your body's ability to fight infections. Try to avoid being around people who are sick. This medicine may increase your risk to bruise or bleed. Call your doctor or health care professional if you notice any unusual bleeding. Be careful brushing and flossing your teeth or using a toothpick because you may get an infection or bleed more easily. If you have any dental work done, tell your dentist you are receiving this medicine. Avoid taking products that contain aspirin, acetaminophen, ibuprofen, naproxen, or ketoprofen unless instructed by your doctor. These medicines may hide a fever. Do not become pregnant while taking this medicine. Women should inform their doctor if they wish to become pregnant or think they might be pregnant. There is a potential for serious side effects to an unborn child. Talk to your health care professional or pharmacist for more information. Do not breast-feed an infant while taking this medicine. Call your doctor or health care professional if you get diarrhea. Do not treat yourself. What side effects may I notice from receiving this medicine? Side effects that you should report to your doctor or health care professional as soon as possible: -allergic reactions like skin rash, itching or hives, swelling of the face, lips, or tongue -low blood counts - This drug may decrease the number of white blood cells, red blood cells and platelets. You may be at increased risk for infections and bleeding. -signs of infection - fever or chills, cough, sore throat, pain or difficulty passing urine -signs of decreased  platelets or bleeding - bruising, pinpoint red spots on the skin, black, tarry stools, nosebleeds -signs of decreased red blood cells - unusually weak or tired, fainting spells, lightheadedness -breathing problems -chest pain, pressure -cough -diarrhea -jaw tightness -mouth sores -nausea and vomiting -pain, swelling, redness or irritation at the injection site -pain, tingling, numbness in the hands or feet -problems with balance, talking, walking -redness, blistering, peeling or loosening of the skin, including inside the mouth -trouble passing urine or change in the amount of urine Side effects that usually do not require medical attention (report to your doctor or health care professional if they continue or are bothersome): -changes in vision -constipation -hair loss -loss of appetite -metallic taste in the mouth or changes in taste -stomach pain This list may not describe all possible side effects. Call your doctor for medical advice about side effects. You may report side effects to FDA at 1-800-FDA-1088. Where should I keep my medicine? This drug is given in a hospital or clinic and will not be stored at home. NOTE: This sheet is a summary. It may not cover all possible information. If you have questions about this medicine, talk to your doctor, pharmacist, or health care provider.  2018 Elsevier/Gold Standard (2008-03-25 17:22:47)   Irinotecan injection What is this medicine? IRINOTECAN (ir in oh TEE kan ) is a chemotherapy drug. It is used to treat colon and  rectal cancer. This medicine may be used for other purposes; ask your health care provider or pharmacist if you have questions. COMMON BRAND NAME(S): Camptosar What should I tell my health care provider before I take this medicine? They need to know if you have any of these conditions: -blood disorders -dehydration -diarrhea -infection (especially a virus infection such as chickenpox, cold sores, or herpes) -liver  disease -low blood counts, like low white cell, platelet, or red cell counts -recent or ongoing radiation therapy -an unusual or allergic reaction to irinotecan, sorbitol, other chemotherapy, other medicines, foods, dyes, or preservatives -pregnant or trying to get pregnant -breast-feeding How should I use this medicine? This drug is given as an infusion into a vein. It is administered in a hospital or clinic by a specially trained health care professional. Talk to your pediatrician regarding the use of this medicine in children. Special care may be needed. Overdosage: If you think you have taken too much of this medicine contact a poison control center or emergency room at once. NOTE: This medicine is only for you. Do not share this medicine with others. What if I miss a dose? It is important not to miss your dose. Call your doctor or health care professional if you are unable to keep an appointment. What may interact with this medicine? Do not take this medicine with any of the following medications: -atazanavir -certain medicines for fungal infections like itraconazole and ketoconazole -St. John's Wort This medicine may also interact with the following medications: -dexamethasone -diuretics -laxatives -medicines for seizures like carbamazepine, mephobarbital, phenobarbital, phenytoin, primidone -medicines to increase blood counts like filgrastim, pegfilgrastim, sargramostim -prochlorperazine -vaccines This list may not describe all possible interactions. Give your health care provider a list of all the medicines, herbs, non-prescription drugs, or dietary supplements you use. Also tell them if you smoke, drink alcohol, or use illegal drugs. Some items may interact with your medicine. What should I watch for while using this medicine? Your condition will be monitored carefully while you are receiving this medicine. You will need important blood work done while you are taking this  medicine. This drug may make you feel generally unwell. This is not uncommon, as chemotherapy can affect healthy cells as well as cancer cells. Report any side effects. Continue your course of treatment even though you feel ill unless your doctor tells you to stop. In some cases, you may be given additional medicines to help with side effects. Follow all directions for their use. You may get drowsy or dizzy. Do not drive, use machinery, or do anything that needs mental alertness until you know how this medicine affects you. Do not stand or sit up quickly, especially if you are an older patient. This reduces the risk of dizzy or fainting spells. Call your doctor or health care professional for advice if you get a fever, chills or sore throat, or other symptoms of a cold or flu. Do not treat yourself. This drug decreases your body's ability to fight infections. Try to avoid being around people who are sick. This medicine may increase your risk to bruise or bleed. Call your doctor or health care professional if you notice any unusual bleeding. Be careful brushing and flossing your teeth or using a toothpick because you may get an infection or bleed more easily. If you have any dental work done, tell your dentist you are receiving this medicine. Avoid taking products that contain aspirin, acetaminophen, ibuprofen, naproxen, or ketoprofen unless instructed by your doctor.  These medicines may hide a fever. Do not become pregnant while taking this medicine. Women should inform their doctor if they wish to become pregnant or think they might be pregnant. There is a potential for serious side effects to an unborn child. Talk to your health care professional or pharmacist for more information. Do not breast-feed an infant while taking this medicine. What side effects may I notice from receiving this medicine? Side effects that you should report to your doctor or health care professional as soon as  possible: -allergic reactions like skin rash, itching or hives, swelling of the face, lips, or tongue -low blood counts - this medicine may decrease the number of white blood cells, red blood cells and platelets. You may be at increased risk for infections and bleeding. -signs of infection - fever or chills, cough, sore throat, pain or difficulty passing urine -signs of decreased platelets or bleeding - bruising, pinpoint red spots on the skin, black, tarry stools, blood in the urine -signs of decreased red blood cells - unusually weak or tired, fainting spells, lightheadedness -breathing problems -chest pain -diarrhea -feeling faint or lightheaded, falls -flushing, runny nose, sweating during infusion -mouth sores or pain -pain, swelling, redness or irritation where injected -pain, swelling, warmth in the leg -pain, tingling, numbness in the hands or feet -problems with balance, talking, walking -stomach cramps, pain -trouble passing urine or change in the amount of urine -vomiting as to be unable to hold down drinks or food -yellowing of the eyes or skin Side effects that usually do not require medical attention (report to your doctor or health care professional if they continue or are bothersome): -constipation -hair loss -headache -loss of appetite -nausea, vomiting -stomach upset This list may not describe all possible side effects. Call your doctor for medical advice about side effects. You may report side effects to FDA at 1-800-FDA-1088. Where should I keep my medicine? This drug is given in a hospital or clinic and will not be stored at home. NOTE: This sheet is a summary. It may not cover all possible information. If you have questions about this medicine, talk to your doctor, pharmacist, or health care provider.  2018 Elsevier/Gold Standard (2013-02-25 16:29:32)   Leucovorin injection What is this medicine? LEUCOVORIN (loo koe VOR in) is used to prevent or treat the  harmful effects of some medicines. This medicine is used to treat anemia caused by a low amount of folic acid in the body. It is also used with 5-fluorouracil (5-FU) to treat colon cancer. This medicine may be used for other purposes; ask your health care provider or pharmacist if you have questions. What should I tell my health care provider before I take this medicine? They need to know if you have any of these conditions: -anemia from low levels of vitamin B-12 in the blood -an unusual or allergic reaction to leucovorin, folic acid, other medicines, foods, dyes, or preservatives -pregnant or trying to get pregnant -breast-feeding How should I use this medicine? This medicine is for injection into a muscle or into a vein. It is given by a health care professional in a hospital or clinic setting. Talk to your pediatrician regarding the use of this medicine in children. Special care may be needed. Overdosage: If you think you have taken too much of this medicine contact a poison control center or emergency room at once. NOTE: This medicine is only for you. Do not share this medicine with others. What if I miss a dose?  This does not apply. What may interact with this medicine? -capecitabine -fluorouracil -phenobarbital -phenytoin -primidone -trimethoprim-sulfamethoxazole This list may not describe all possible interactions. Give your health care provider a list of all the medicines, herbs, non-prescription drugs, or dietary supplements you use. Also tell them if you smoke, drink alcohol, or use illegal drugs. Some items may interact with your medicine. What should I watch for while using this medicine? Your condition will be monitored carefully while you are receiving this medicine. This medicine may increase the side effects of 5-fluorouracil, 5-FU. Tell your doctor or health care professional if you have diarrhea or mouth sores that do not get better or that get worse. What side effects  may I notice from receiving this medicine? Side effects that you should report to your doctor or health care professional as soon as possible: -allergic reactions like skin rash, itching or hives, swelling of the face, lips, or tongue -breathing problems -fever, infection -mouth sores -unusual bleeding or bruising -unusually weak or tired Side effects that usually do not require medical attention (report to your doctor or health care professional if they continue or are bothersome): -constipation or diarrhea -loss of appetite -nausea, vomiting This list may not describe all possible side effects. Call your doctor for medical advice about side effects. You may report side effects to FDA at 1-800-FDA-1088. Where should I keep my medicine? This drug is given in a hospital or clinic and will not be stored at home. NOTE: This sheet is a summary. It may not cover all possible information. If you have questions about this medicine, talk to your doctor, pharmacist, or health care provider.  2018 Else[employer]/Gold Standard (2008-03-04 16:50:29)    Fluorouracil, 5-FU injection What is this medicine? FLUOROURACIL, 5-FU (flure oh YOOR a sil) is a chemotherapy drug. It slows the growth of cancer cells. This medicine is used to treat many types of cancer like breast cancer, colon or rectal cancer, pancreatic cancer, and stomach cancer. This medicine may be used for other purposes; ask your health care provider or pharmacist if you have questions. COMMON BRAND NAME(S): Adrucil What should I tell my health care provider before I take this medicine? They need to know if you have any of these conditions: -blood disorders -dihydropyrimidine dehydrogenase (DPD) deficiency -infection (especially a virus infection such as chickenpox, cold sores, or herpes) -kidney disease -liver disease -malnourished, poor nutrition -recent or ongoing radiation therapy -an unusual or allergic reaction to fluorouracil, other  chemotherapy, other medicines, foods, dyes, or preservatives -pregnant or trying to get pregnant -breast-feeding How should I use this medicine? This drug is given as an infusion or injection into a vein. It is administered in a hospital or clinic by a specially trained health care professional. Talk to your pediatrician regarding the use of this medicine in children. Special care may be needed. Overdosage: If you think you have taken too much of this medicine contact a poison control center or emergency room at once. NOTE: This medicine is only for you. Do not share this medicine with others. What if I miss a dose? It is important not to miss your dose. Call your doctor or health care professional if you are unable to keep an appointment. What may interact with this medicine? -allopurinol -cimetidine -dapsone -digoxin -hydroxyurea -leucovorin -levamisole -medicines for seizures like ethotoin, fosphenytoin, phenytoin -medicines to increase blood counts like filgrastim, pegfilgrastim, sargramostim -medicines that treat or prevent blood clots like warfarin, enoxaparin, and dalteparin -methotrexate -metronidazole -pyrimethamine -some other chemotherapy  drugs like busulfan, cisplatin, estramustine, vinblastine -trimethoprim -trimetrexate -vaccines Talk to your doctor or health care professional before taking any of these medicines: -acetaminophen -aspirin -ibuprofen -ketoprofen -naproxen This list may not describe all possible interactions. Give your health care provider a list of all the medicines, herbs, non-prescription drugs, or dietary supplements you use. Also tell them if you smoke, drink alcohol, or use illegal drugs. Some items may interact with your medicine. What should I watch for while using this medicine? Visit your doctor for checks on your progress. This drug may make you feel generally unwell. This is not uncommon, as chemotherapy can affect healthy cells as well as  cancer cells. Report any side effects. Continue your course of treatment even though you feel ill unless your doctor tells you to stop. In some cases, you may be given additional medicines to help with side effects. Follow all directions for their use. Call your doctor or health care professional for advice if you get a fever, chills or sore throat, or other symptoms of a cold or flu. Do not treat yourself. This drug decreases your body's ability to fight infections. Try to avoid being around people who are sick. This medicine may increase your risk to bruise or bleed. Call your doctor or health care professional if you notice any unusual bleeding. Be careful brushing and flossing your teeth or using a toothpick because you may get an infection or bleed more easily. If you have any dental work done, tell your dentist you are receiving this medicine. Avoid taking products that contain aspirin, acetaminophen, ibuprofen, naproxen, or ketoprofen unless instructed by your doctor. These medicines may hide a fever. Do not become pregnant while taking this medicine. Women should inform their doctor if they wish to become pregnant or think they might be pregnant. There is a potential for serious side effects to an unborn child. Talk to your health care professional or pharmacist for more information. Do not breast-feed an infant while taking this medicine. Men should inform their doctor if they wish to father a child. This medicine may lower sperm counts. Do not treat diarrhea with over the counter products. Contact your doctor if you have diarrhea that lasts more than 2 days or if it is severe and watery. This medicine can make you more sensitive to the sun. Keep out of the sun. If you cannot avoid being in the sun, wear protective clothing and use sunscreen. Do not use sun lamps or tanning beds/booths. What side effects may I notice from receiving this medicine? Side effects that you should report to your doctor  or health care professional as soon as possible: -allergic reactions like skin rash, itching or hives, swelling of the face, lips, or tongue -low blood counts - this medicine may decrease the number of white blood cells, red blood cells and platelets. You may be at increased risk for infections and bleeding. -signs of infection - fever or chills, cough, sore throat, pain or difficulty passing urine -signs of decreased platelets or bleeding - bruising, pinpoint red spots on the skin, black, tarry stools, blood in the urine -signs of decreased red blood cells - unusually weak or tired, fainting spells, lightheadedness -breathing problems -changes in vision -chest pain -mouth sores -nausea and vomiting -pain, swelling, redness at site where injected -pain, tingling, numbness in the hands or feet -redness, swelling, or sores on hands or feet -stomach pain -unusual bleeding Side effects that usually do not require medical attention (report to your doctor  or health care professional if they continue or are bothersome): -changes in finger or toe nails -diarrhea -dry or itchy skin -hair loss -headache -loss of appetite -sensitivity of eyes to the light -stomach upset -unusually teary eyes This list may not describe all possible side effects. Call your doctor for medical advice about side effects. You may report side effects to FDA at 1-800-FDA-1088. Where should I keep my medicine? This drug is given in a hospital or clinic and will not be stored at home. NOTE: This sheet is a summary. It may not cover all possible information. If you have questions about this medicine, talk to your doctor, pharmacist, or health care provider.  2018 [employer]/Gold Standard (2008-01-02 13:53:16)

## 2017-09-16 NOTE — Progress Notes (Signed)
1315: Pt reported feeling light-headed. Denied any other symptoms. Irinotecan and Leucovorin infusions paused. VS unremarkable. Pt reported relief after 10 minutes. Infusion resumed with close monitoring. 1400: Pt tolerated remainder of infusion without difficulty. Pump troubleshooting, port site care and office contact information reviewed. Teach back complete.

## 2017-09-18 ENCOUNTER — Other Ambulatory Visit: Payer: Self-pay

## 2017-09-18 ENCOUNTER — Inpatient Hospital Stay: Payer: Medicare Other | Admitting: Nutrition

## 2017-09-18 ENCOUNTER — Inpatient Hospital Stay: Payer: Medicare Other | Attending: Oncology

## 2017-09-18 VITALS — BP 167/80 | HR 77 | Temp 97.8°F | Resp 18

## 2017-09-18 DIAGNOSIS — K521 Toxic gastroenteritis and colitis: Secondary | ICD-10-CM | POA: Diagnosis not present

## 2017-09-18 DIAGNOSIS — R630 Anorexia: Secondary | ICD-10-CM | POA: Diagnosis not present

## 2017-09-18 DIAGNOSIS — N2889 Other specified disorders of kidney and ureter: Secondary | ICD-10-CM | POA: Diagnosis not present

## 2017-09-18 DIAGNOSIS — R197 Diarrhea, unspecified: Secondary | ICD-10-CM | POA: Diagnosis not present

## 2017-09-18 DIAGNOSIS — Z87891 Personal history of nicotine dependence: Secondary | ICD-10-CM | POA: Insufficient documentation

## 2017-09-18 DIAGNOSIS — I252 Old myocardial infarction: Secondary | ICD-10-CM | POA: Diagnosis not present

## 2017-09-18 DIAGNOSIS — T451X5A Adverse effect of antineoplastic and immunosuppressive drugs, initial encounter: Secondary | ICD-10-CM | POA: Insufficient documentation

## 2017-09-18 DIAGNOSIS — I10 Essential (primary) hypertension: Secondary | ICD-10-CM | POA: Insufficient documentation

## 2017-09-18 DIAGNOSIS — E119 Type 2 diabetes mellitus without complications: Secondary | ICD-10-CM | POA: Insufficient documentation

## 2017-09-18 DIAGNOSIS — R531 Weakness: Secondary | ICD-10-CM | POA: Diagnosis not present

## 2017-09-18 DIAGNOSIS — R112 Nausea with vomiting, unspecified: Secondary | ICD-10-CM | POA: Insufficient documentation

## 2017-09-18 DIAGNOSIS — I2510 Atherosclerotic heart disease of native coronary artery without angina pectoris: Secondary | ICD-10-CM | POA: Diagnosis not present

## 2017-09-18 DIAGNOSIS — R634 Abnormal weight loss: Secondary | ICD-10-CM | POA: Insufficient documentation

## 2017-09-18 DIAGNOSIS — Z5189 Encounter for other specified aftercare: Secondary | ICD-10-CM | POA: Insufficient documentation

## 2017-09-18 DIAGNOSIS — C250 Malignant neoplasm of head of pancreas: Secondary | ICD-10-CM | POA: Insufficient documentation

## 2017-09-18 MED ORDER — HEPARIN SOD (PORK) LOCK FLUSH 100 UNIT/ML IV SOLN
500.0000 [IU] | Freq: Once | INTRAVENOUS | Status: AC | PRN
  Administered 2017-09-18: 500 [IU]
  Filled 2017-09-18: qty 5

## 2017-09-18 MED ORDER — PEGFILGRASTIM INJECTION 6 MG/0.6ML ~~LOC~~
PREFILLED_SYRINGE | SUBCUTANEOUS | Status: AC
  Filled 2017-09-18: qty 0.6

## 2017-09-18 MED ORDER — PEGFILGRASTIM INJECTION 6 MG/0.6ML ~~LOC~~
6.0000 mg | PREFILLED_SYRINGE | Freq: Once | SUBCUTANEOUS | Status: AC
  Administered 2017-09-18: 6 mg via SUBCUTANEOUS

## 2017-09-18 MED ORDER — BACLOFEN 10 MG PO TABS: 5.0000 mg | ORAL_TABLET | Freq: Three times a day (TID) | ORAL | 0 refills | Status: DC | PRN

## 2017-09-18 MED ORDER — SODIUM CHLORIDE 0.9% FLUSH
10.0000 mL | INTRAVENOUS | Status: DC | PRN
  Administered 2017-09-18: 10 mL
  Filled 2017-09-18: qty 10

## 2017-09-18 NOTE — Progress Notes (Signed)
72 year old male diagnosed with pancreas cancer.  He is a patient of Dr. Julieanne Manson.  Past medical history includes CAD, tobacco, GERD, hyperlipidemia, renal mass, stroke, and diabetes.  Medications include Lipitor.  Labs were reviewed.  Noted glucose 236 on December 12.  Height: 5 feet 10 inches. Weight: 171 pounds December 21. Usual body weight: 175 pounds per patient. BMI: 24.39.  Patient reports his appetite is good and he is eating well. He denies nutrition impact symptoms currently status post 1 cycle FOLFIRINOX. Reports he has increased his water intake.  He is achieving 64 ounces of water daily. He does have the hiccups which he has a prescription. He is avoiding concentrated sweets for better glycemic control.  Nutrition diagnosis: Food and nutrition related knowledge deficit related to new diagnosis of pancreas cancer as evidenced by no prior need for nutrition related information.  Intervention: Educated patient to increase calories and protein of small frequent meals and snacks to achieve weight maintenance. Encouraged patient to continue increased water intake. Educated patient on no concentrated sweets diet and provided fact sheet. Questions were answered.  Teach back method used.  Contact information provided. Provided patient with samples of Glucerna and boost glucose control.  Monitoring, evaluation, goals: Patient will tolerate adequate calories and protein to maintain weight.  Next visit: Monday, January 21 during infusion.  **Disclaimer: This note was dictated with voice recognition software. Similar sounding words can inadvertently be transcribed and this note may contain transcription errors which may not have been corrected upon publication of note.**

## 2017-09-19 ENCOUNTER — Encounter (HOSPITAL_BASED_OUTPATIENT_CLINIC_OR_DEPARTMENT_OTHER): Payer: Self-pay | Admitting: General Surgery

## 2017-09-19 NOTE — Progress Notes (Signed)
  Oncology Nurse Navigator Documentation  Navigator Location: [facility] (09/19/17 1357)   )Navigator Encounter Type: Telephone (09/19/17 1357) Telephone: [place] Call (09/19/17 1357)    Called and spoke with wife to assess for navigation needs. Patient still having hiccoughs and taking Baclofen as prescribed. Patient also nauseated without emesis. PRN medications reviewed with wife. Mrs. Napolitano verbalized that she can call [facility] with questions or concerns.              Treatment Initiated Date: 09/16/17 (09/19/17 1357)   Treatment Phase: Active Tx (09/19/17 1357)     Interventions: Education;Psycho-social support (09/19/17 1357)     Education Method: Teach-back;Verbal (09/19/17 1357)      Acuity: Level 2 (09/19/17 1357)         Time Spent with Patient: 30 (09/19/17 1357)

## 2017-09-21 ENCOUNTER — Ambulatory Visit: Payer: Medicare Other | Admitting: Endocrinology

## 2017-09-21 ENCOUNTER — Ambulatory Visit: Payer: Medicare Other | Admitting: "Endocrinology

## 2017-09-23 ENCOUNTER — Emergency Department (HOSPITAL_COMMUNITY): Payer: Medicare Other

## 2017-09-23 ENCOUNTER — Encounter (HOSPITAL_COMMUNITY): Payer: Self-pay | Admitting: Emergency Medicine

## 2017-09-23 ENCOUNTER — Emergency Department (HOSPITAL_COMMUNITY)
Admission: EM | Admit: 2017-09-23 | Discharge: 2017-09-23 | Disposition: A | Discharge status: Home / Self Care | Payer: Medicare Other | Attending: Emergency Medicine | Admitting: Emergency Medicine

## 2017-09-23 DIAGNOSIS — Z794 Long term (current) use of insulin: Secondary | ICD-10-CM | POA: Insufficient documentation

## 2017-09-23 DIAGNOSIS — Z7982 Long term (current) use of aspirin: Secondary | ICD-10-CM | POA: Diagnosis not present

## 2017-09-23 DIAGNOSIS — R1110 Vomiting, unspecified: Secondary | ICD-10-CM | POA: Diagnosis not present

## 2017-09-23 DIAGNOSIS — Z87891 Personal history of nicotine dependence: Secondary | ICD-10-CM | POA: Insufficient documentation

## 2017-09-23 DIAGNOSIS — I2510 Atherosclerotic heart disease of native coronary artery without angina pectoris: Secondary | ICD-10-CM | POA: Diagnosis not present

## 2017-09-23 DIAGNOSIS — E860 Dehydration: Secondary | ICD-10-CM | POA: Insufficient documentation

## 2017-09-23 DIAGNOSIS — Z95828 Presence of other vascular implants and grafts: Secondary | ICD-10-CM | POA: Diagnosis not present

## 2017-09-23 DIAGNOSIS — C250 Malignant neoplasm of head of pancreas: Secondary | ICD-10-CM | POA: Insufficient documentation

## 2017-09-23 DIAGNOSIS — I10 Essential (primary) hypertension: Secondary | ICD-10-CM | POA: Diagnosis not present

## 2017-09-23 DIAGNOSIS — Z79899 Other long term (current) drug therapy: Secondary | ICD-10-CM | POA: Insufficient documentation

## 2017-09-23 DIAGNOSIS — E119 Type 2 diabetes mellitus without complications: Secondary | ICD-10-CM | POA: Diagnosis not present

## 2017-09-23 DIAGNOSIS — R112 Nausea with vomiting, unspecified: Secondary | ICD-10-CM | POA: Diagnosis not present

## 2017-09-23 DIAGNOSIS — R197 Diarrhea, unspecified: Secondary | ICD-10-CM | POA: Diagnosis not present

## 2017-09-23 LAB — CBC WITH DIFFERENTIAL/PLATELET
Basophils Absolute: 0 10*3/uL (ref 0.0–0.1)
Basophils Relative: 0 %
Eosinophils Absolute: 0.1 10*3/uL (ref 0.0–0.7)
Eosinophils Relative: 1 %
HCT: 37.6 % — ABNORMAL LOW (ref 39.0–52.0)
Hemoglobin: 12.9 g/dL — ABNORMAL LOW (ref 13.0–17.0)
Lymphocytes Relative: 5 %
Lymphs Abs: 0.7 10*3/uL (ref 0.7–4.0)
MCH: 30.6 pg (ref 26.0–34.0)
MCHC: 34.3 g/dL (ref 30.0–36.0)
MCV: 89.1 fL (ref 78.0–100.0)
Monocytes Absolute: 0.9 10*3/uL (ref 0.1–1.0)
Monocytes Relative: 6 %
Neutro Abs: 13.2 10*3/uL — ABNORMAL HIGH (ref 1.7–7.7)
Neutrophils Relative %: 88 %
Platelets: 78 10*3/uL — ABNORMAL LOW (ref 150–400)
RBC: 4.22 MIL/uL (ref 4.22–5.81)
RDW: 13.4 % (ref 11.5–15.5)
WBC: 14.9 10*3/uL — ABNORMAL HIGH (ref 4.0–10.5)

## 2017-09-23 LAB — COMPREHENSIVE METABOLIC PANEL
ALT: 11 U/L — ABNORMAL LOW (ref 17–63)
AST: 13 U/L — ABNORMAL LOW (ref 15–41)
Albumin: 3.3 g/dL — ABNORMAL LOW (ref 3.5–5.0)
Alkaline Phosphatase: 119 U/L (ref 38–126)
Anion gap: 9 (ref 5–15)
BUN: 42 mg/dL — ABNORMAL HIGH (ref 6–20)
CO2: 22 mmol/L (ref 22–32)
Calcium: 8.5 mg/dL — ABNORMAL LOW (ref 8.9–10.3)
Chloride: 96 mmol/L — ABNORMAL LOW (ref 101–111)
Creatinine, Ser: 1.54 mg/dL — ABNORMAL HIGH (ref 0.61–1.24)
GFR calc Af Amer: 50 mL/min — ABNORMAL LOW (ref >60)
GFR calc non Af Amer: 43 mL/min — ABNORMAL LOW (ref >60)
Glucose, Bld: 322 mg/dL — ABNORMAL HIGH (ref 65–99)
Potassium: 3.9 mmol/L (ref 3.5–5.1)
Sodium: 127 mmol/L — ABNORMAL LOW (ref 135–145)
Total Bilirubin: 1 mg/dL (ref 0.3–1.2)
Total Protein: 5.9 g/dL — ABNORMAL LOW (ref 6.5–8.1)

## 2017-09-23 MED ORDER — SODIUM CHLORIDE 0.9 % IV BOLUS (SEPSIS)
1000.0000 mL | Freq: Once | INTRAVENOUS | Status: AC
  Administered 2017-09-23: 1000 mL via INTRAVENOUS

## 2017-09-23 MED ORDER — HEPARIN SOD (PORK) LOCK FLUSH 100 UNIT/ML IV SOLN
500.0000 [IU] | Freq: Once | INTRAVENOUS | Status: AC
  Administered 2017-09-23: 500 [IU]
  Filled 2017-09-23: qty 5

## 2017-09-23 NOTE — Discharge Instructions (Signed)
Call your oncologist Monday and let him know how you are doing after this visit to the emergency department

## 2017-09-23 NOTE — ED Triage Notes (Signed)
Pt from home c/o N/V/D. Pt has pancreatic CA and received chemo 7 days ago. Pt has had a "chemo pump" over the last few days. Pt wife reports that pt has been given compazine and imodium that previously helped, but has had no relief today. Pt has weakness and dizziness. Pt is A&O and in NAD

## 2017-09-23 NOTE — ED Notes (Signed)
Bed: AJ68 Expected date:  Expected time:  Means of arrival:  Comments: Mental health

## 2017-09-23 NOTE — ED Provider Notes (Signed)
[facility] Provider Note   CSN: [id number] Arrival date & time: 09/23/17  1307     History   Chief Complaint Chief Complaint  Patient presents with  . Nausea  . Emesis  . Diarrhea    HPI CHEO SELVEY is a 72 y.o. male.  Patient had chemotherapy within the last week for pancreatic cancer.  He has been nauseated and have felt weak and has not been take any fluids and   The history is provided by the patient.  Emesis   This is a new problem. The problem occurs 2 to 4 times per day. The problem has been resolved. The emesis has an appearance of stomach contents. There has been no fever. The fever has been present for less than 1 day. Associated symptoms include diarrhea. Pertinent negatives include no abdominal pain, no cough and no headaches.  Diarrhea   Associated symptoms include vomiting. Pertinent negatives include no abdominal pain, no headaches and no cough.    Past Medical History:  Diagnosis Date  . Arthritis   . CAD (coronary artery disease)    a. 11/2016 in [place]: STEMI s/p DES to mid LAD, DES to diagonal, DES x 2 to proximal and mid RCA   . Essential hypertension   . Former smoker   . GERD (gastroesophageal reflux disease)   . Hyperlipidemia   . Pancreatic cancer ([place])   . Renal mass   . S/P TAVR (transcatheter aortic valve replacement)    a. 08/2017:  Edwards Sapien 3 THV (size 26 mm, model #9600CM26A , serial # [id number])  . Severe aortic stenosis    a. 08/2017: s/p TAVR by Dr. Angelena Form and Dr. Cyndia Bent.   . 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 Type 2 diabetes mellitus [facility])     Patient Active Problem List   Diagnosis Date Noted  . DM type 2 causing vascular disease ([place]) 08/25/2017  . Mixed hyperlipidemia 08/25/2017  . Essential hypertension, benign   . Pancreatic cancer ([place])   . Renal mass   . CAD (coronary artery disease)   . Former smoker   . S/P TAVR (transcatheter aortic valve replacement) 08/22/2017    . Cancer of head of pancreas ([place]) 08/10/2017  . Severe aortic stenosis 07/05/2017    Past Surgical History:  Procedure Laterality Date  . APPENDECTOMY    . EUS N/A 08/02/2017   Procedure: UPPER ENDOSCOPIC ULTRASOUND (EUS) RADIAL;  Surgeon: [name], MD;  Location: [facility];  Service: Endoscopy;  Laterality: N/A;  . EYE SURGERY Left   . HAND SURGERY Bilateral   . HERNIA REPAIR Right   . PORTACATH PLACEMENT N/A 09/15/2017   Procedure: INSERTION PORT-A-CATH;  Surgeon: [name], MD;  Location: [facility];  Service: General;  Laterality: N/A;  . RIGHT/LEFT HEART CATH AND CORONARY ANGIOGRAPHY N/A 07/05/2017   Procedure: RIGHT/LEFT HEART CATH AND CORONARY ANGIOGRAPHY;  Surgeon: [name], MD;  Location: [facility];  Service: Cardiovascular;  Laterality: N/A;  . SHOULDER ARTHROSCOPY WITH ROTATOR CUFF REPAIR Left   . TEE WITHOUT CARDIOVERSION N/A 08/22/2017   Procedure: TRANSESOPHAGEAL ECHOCARDIOGRAM (TEE);  Surgeon: Burnell Blanks, MD;  Location: [facility];  Service: Open Heart Surgery;  Laterality: N/A;  . TRANSCATHETER AORTIC VALVE REPLACEMENT, TRANSFEMORAL N/A 08/22/2017   Procedure: TRANSCATHETER AORTIC VALVE REPLACEMENT, TRANSFEMORAL;  Surgeon: Burnell Blanks, MD;  Location: [facility];  Service: Open Heart Surgery;  Laterality: N/A;  Home Medications    Prior to Admission medications   Medication Sig Start Date End Date Taking? Authorizing Provider  aspirin EC 81 MG tablet Take 1 tablet (81 mg total) by mouth daily. 01/26/17   Satira Sark, MD  atorvastatin (LIPITOR) 20 MG tablet Take 1 tablet (20 mg total) by mouth daily. 08/25/17   Satira Sark, MD  baclofen (LIORESAL) 10 MG tablet Take 0.5 tablets (5 mg total) by mouth 3 (three) times daily as needed for muscle spasms. 09/18/17   Ladell Pier, MD  Blood Glucose Monitoring Suppl (ONETOUCH VERIO) w/Device KIT 1 each by Does not apply route as needed. 09/01/17    Cassandria Anger, MD  carvedilol (COREG) 3.125 MG tablet TAKE (1) TABLET BY MOUTH TWICE A DAY WITH MEALS (BREAKFAST AND SUPPER) 09/11/17   Satira Sark, MD  clopidogrel (PLAVIX) 75 MG tablet Take 1 tablet (75 mg total) by mouth daily. 07/06/17   Satira Sark, MD  glucose blood [facility] VERIO) test strip Use as instructed 09/01/17   Cassandria Anger, MD  insulin aspart (FIASP FLEXTOUCH) 100 UNIT/ML FlexPen Inject 10-16 Units into the skin 3 (three) times daily with meals. 09/01/17   Cassandria Anger, MD  insulin degludec (TRESIBA) 100 UNIT/ML SOPN FlexTouch Pen Inject 0.4 mLs (40 Units total) into the skin daily at 10 pm. 09/01/17   Nida, Marella Chimes, MD  Insulin Pen Needle (B-D ULTRAFINE III SHORT PEN) 31G X 8 MM MISC 1 each by Does not apply route as directed. 08/25/17   Cassandria Anger, MD  lidocaine-prilocaine (EMLA) cream Apply 1 application topically as needed. Apply small amount of cream over port site 1-2 hrs prior to chemo, cover with plastic wrap. 09/07/17   Owens Shark, NP  lisinopril (PRINIVIL,ZESTRIL) 10 MG tablet TAKE 1 TABLET BY MOUTH ONCE DAILY. 09/11/17   Satira Sark, MD  oxyCODONE (OXY IR/ROXICODONE) 5 MG immediate release tablet Take 0.5-1 tablets (2.5-5 mg total) by mouth every 6 (six) hours as needed for severe pain or breakthrough pain. 09/15/17   [name], MD  prochlorperazine (COMPAZINE) 5 MG tablet Take 1 tablet (5 mg total) by mouth every 6 (six) hours as needed for nausea or vomiting. 09/07/17   Owens Shark, NP    Family History Family History  Problem Relation Age of Onset  . Lupus Mother   . CAD Brother   . Hypertension Brother     Social History Social History   Tobacco Use  . Smoking status: Former Smoker    Packs/day: 1.00    Years: 57.00    Pack years: 57.00    Types: Cigarettes    Start date: 09/12/1958    Last attempt to quit: 04/12/2016    Years since quitting: 1.4  . Smokeless tobacco: Never Used    Substance Use Topics  . Alcohol use: No  . Drug use: No     Allergies   Patient has no known allergies.   Review of Systems Review of Systems  Constitutional: Negative for appetite change and fatigue.  HENT: Negative for congestion, ear discharge and sinus pressure.   Eyes: Negative for discharge.  Respiratory: Negative for cough.   Cardiovascular: Negative for chest pain.  Gastrointestinal: Positive for diarrhea and vomiting. Negative for abdominal pain.  Genitourinary: Negative for frequency and hematuria.  Musculoskeletal: Negative for back pain.  Skin: Negative for rash.  Neurological: Negative for seizures and headaches.  Psychiatric/Behavioral: Negative for hallucinations.  Physical Exam Updated Vital Signs BP 125/70 (BP Location: Right Arm)   Pulse 89   Temp 97.7 F (36.5 C) (Oral)   Resp 17   SpO2 97%   Physical Exam  Constitutional: He is oriented to person, place, and time. He appears well-developed.  HENT:  Head: Normocephalic.  Dry mucous membrane  Eyes: Conjunctivae and EOM are normal. No scleral icterus.  Neck: Neck supple. No thyromegaly present.  Cardiovascular: Normal rate and regular rhythm. Exam reveals no gallop and no friction rub.  No murmur heard. Pulmonary/Chest: No stridor. He has no wheezes. He has no rales. He exhibits no tenderness.  Abdominal: He exhibits no distension. There is no tenderness. There is no rebound.  Musculoskeletal: Normal range of motion. He exhibits no edema.  Lymphadenopathy:    He has no cervical adenopathy.  Neurological: He is oriented to person, place, and time. He exhibits normal muscle tone. Coordination normal.  Skin: No rash noted. No erythema.  Psychiatric: He has a normal mood and affect. His behavior is normal.     ED Treatments / Results  Labs (all labs ordered are listed, but only abnormal results are displayed) Labs Reviewed  CBC WITH DIFFERENTIAL/PLATELET - Abnormal; Notable for the  following components:      Result Value   WBC 14.9 (*)    Hemoglobin 12.9 (*)    HCT 37.6 (*)    Platelets 78 (*)    Neutro Abs 13.2 (*)    All other components within normal limits  COMPREHENSIVE METABOLIC PANEL - Abnormal; Notable for the following components:   Sodium 127 (*)    Chloride 96 (*)    Glucose, Bld 322 (*)    BUN 42 (*)    Creatinine, Ser 1.54 (*)    Calcium 8.5 (*)    Total Protein 5.9 (*)    Albumin 3.3 (*)    AST 13 (*)    ALT 11 (*)    GFR calc non Af Amer 43 (*)    GFR calc Af Amer 50 (*)    All other components within normal limits    EKG  EKG Interpretation None       Radiology Dg Abd Acute W/chest  Result Date: 09/23/2017 CLINICAL DATA:  Nausea, vomiting and diarrhea. Undergoing chemotherapy for pancreatic cancer. Ex-smoker. EXAM: DG ABDOMEN ACUTE W/ 1V CHEST COMPARISON:  PET-CT dated 08/30/2017 and portable chest dated 09/15/2017. Abdomen MR dated 07/27/2017. Chest, abdomen and pelvis CT dated 07/20/2017. FINDINGS: Normal sized heart. Clear lungs. The lungs remain hyperexpanded. Stable left subclavian porta catheter with its tip in the superior vena cava. Stable aortic valve stent. Thoracic spine degenerative changes. Normal bowel gas pattern without free peritoneal air. Hernia repair mesh markers in the right lower pelvic region. Lumbar spine degenerative changes at L2 vertebral compression deformity without significant change since 07/27/2017 and 07/20/2017. IMPRESSION: 1. No acute abnormality. 2. COPD. Electronically Signed   By: Claudie Revering M.D.   On: 09/23/2017 14:58    Procedures Procedures (including critical care time)  Medications Ordered in ED Medications  sodium chloride 0.9 % bolus 1,000 mL (not administered)  sodium chloride 0.9 % bolus 1,000 mL (1,000 mLs Intravenous New Bag/Given 09/23/17 1406)     Initial Impression / Assessment and Plan / ED Course  I have reviewed the triage vital signs and the nursing notes.  Pertinent labs  & imaging results that were available during my care of the patient were reviewed by me and considered in my  medical decision making (see chart for details).     Patient improved significantly with 1 L of normal saline.  He will be given a second liter discharged home.  He is to follow-up at least by phone with his oncologist next week  Final Clinical Impressions(s) / ED Diagnoses   Final diagnoses:  Dehydration    ED Discharge Orders    None       [name] Ferguson, MD 09/23/17 1542

## 2017-09-25 ENCOUNTER — Telehealth: Payer: Self-pay

## 2017-09-25 ENCOUNTER — Inpatient Hospital Stay: Payer: Medicare Other

## 2017-09-25 ENCOUNTER — Telehealth: Payer: Self-pay | Admitting: Nurse Practitioner

## 2017-09-25 ENCOUNTER — Encounter: Payer: Self-pay | Admitting: Nurse Practitioner

## 2017-09-25 ENCOUNTER — Inpatient Hospital Stay (HOSPITAL_BASED_OUTPATIENT_CLINIC_OR_DEPARTMENT_OTHER): Payer: Medicare Other | Admitting: Nurse Practitioner

## 2017-09-25 VITALS — BP 105/66 | HR 84 | Temp 98.0°F | Resp 18 | Ht 70.0 in | Wt 161.6 lb

## 2017-09-25 DIAGNOSIS — Z87891 Personal history of nicotine dependence: Secondary | ICD-10-CM

## 2017-09-25 DIAGNOSIS — C250 Malignant neoplasm of head of pancreas: Secondary | ICD-10-CM

## 2017-09-25 DIAGNOSIS — I10 Essential (primary) hypertension: Secondary | ICD-10-CM

## 2017-09-25 DIAGNOSIS — R112 Nausea with vomiting, unspecified: Secondary | ICD-10-CM

## 2017-09-25 DIAGNOSIS — R197 Diarrhea, unspecified: Secondary | ICD-10-CM | POA: Diagnosis not present

## 2017-09-25 DIAGNOSIS — Z95828 Presence of other vascular implants and grafts: Secondary | ICD-10-CM

## 2017-09-25 DIAGNOSIS — R630 Anorexia: Secondary | ICD-10-CM

## 2017-09-25 DIAGNOSIS — E119 Type 2 diabetes mellitus without complications: Secondary | ICD-10-CM | POA: Diagnosis not present

## 2017-09-25 DIAGNOSIS — R634 Abnormal weight loss: Secondary | ICD-10-CM | POA: Diagnosis not present

## 2017-09-25 LAB — CMP (CANCER CENTER ONLY)
ALT: 26 U/L (ref 0–55)
AST: 12 U/L (ref 5–34)
Albumin: 2.8 g/dL — ABNORMAL LOW (ref 3.5–5.0)
Alkaline Phosphatase: 87 U/L (ref 40–150)
Anion gap: 8 (ref 3–11)
BUN: 29 mg/dL — ABNORMAL HIGH (ref 7–26)
CO2: 21 mmol/L — ABNORMAL LOW (ref 22–29)
Calcium: 8.5 mg/dL (ref 8.4–10.4)
Chloride: 101 mmol/L (ref 98–109)
Creatinine: 1.11 mg/dL (ref 0.70–1.30)
GFR, Est AFR Am: >60 mL/min (ref >60)
GFR, Estimated: >60 mL/min (ref >60)
Glucose, Bld: 208 mg/dL — ABNORMAL HIGH (ref 70–140)
Potassium: 3.7 mmol/L (ref 3.5–5.1)
Sodium: 130 mmol/L — ABNORMAL LOW (ref 136–145)
Total Bilirubin: 0.3 mg/dL (ref 0.2–1.2)
Total Protein: 5.5 g/dL — ABNORMAL LOW (ref 6.4–8.3)

## 2017-09-25 LAB — CBC WITH DIFFERENTIAL (CANCER CENTER ONLY)
Basophils Absolute: 0 10*3/uL (ref 0.0–0.1)
Basophils Relative: 0 %
Eosinophils Absolute: 0.1 10*3/uL (ref 0.0–0.5)
Eosinophils Relative: 3 %
HCT: 34.4 % — ABNORMAL LOW (ref 38.4–49.9)
Hemoglobin: 11.3 g/dL — ABNORMAL LOW (ref 13.0–17.1)
Lymphocytes Relative: 21 %
Lymphs Abs: 0.7 10*3/uL — ABNORMAL LOW (ref 0.9–3.3)
MCH: 29.6 pg (ref 27.2–33.4)
MCHC: 32.8 g/dL (ref 32.0–36.0)
MCV: 90.2 fL (ref 79.3–98.0)
Monocytes Absolute: 0.7 10*3/uL (ref 0.1–0.9)
Monocytes Relative: 22 %
Neutro Abs: 1.7 10*3/uL (ref 1.5–6.5)
Neutrophils Relative %: 54 %
Platelet Count: 89 10*3/uL — ABNORMAL LOW (ref 140–400)
RBC: 3.82 MIL/uL — ABNORMAL LOW (ref 4.20–5.82)
RDW: 13.6 % (ref 11.0–15.6)
WBC Count: 3.1 10*3/uL — ABNORMAL LOW (ref 4.0–10.3)

## 2017-09-25 LAB — AMYLASE: Amylase: 15 U/L — ABNORMAL LOW (ref 28–100)

## 2017-09-25 LAB — LIPASE, BLOOD: Lipase: <10 U/L — ABNORMAL LOW (ref 11–51)

## 2017-09-25 MED ORDER — DIPHENOXYLATE-ATROPINE 2.5-0.025 MG PO TABS
ORAL_TABLET | ORAL | Status: AC
  Filled 2017-09-25: qty 2

## 2017-09-25 MED ORDER — ONDANSETRON HCL 8 MG PO TABS: 8.0000 mg | ORAL_TABLET | Freq: Three times a day (TID) | ORAL | 0 refills | Status: DC | PRN

## 2017-09-25 MED ORDER — ONDANSETRON HCL 4 MG/2ML IJ SOLN
INTRAMUSCULAR | Status: AC
  Filled 2017-09-25: qty 4

## 2017-09-25 MED ORDER — HEPARIN SOD (PORK) LOCK FLUSH 100 UNIT/ML IV SOLN
500.0000 [IU] | Freq: Once | INTRAVENOUS | Status: AC
  Administered 2017-09-25: 500 [IU] via INTRAVENOUS
  Filled 2017-09-25: qty 5

## 2017-09-25 MED ORDER — ONDANSETRON HCL 8 MG PO TABS
ORAL_TABLET | ORAL | Status: AC
  Filled 2017-09-25: qty 1

## 2017-09-25 MED ORDER — SODIUM CHLORIDE 0.9% FLUSH
10.0000 mL | INTRAVENOUS | Status: DC | PRN
  Administered 2017-09-25: 10 mL via INTRAVENOUS
  Filled 2017-09-25: qty 10

## 2017-09-25 MED ORDER — SODIUM CHLORIDE 0.9 % IV SOLN
INTRAVENOUS | Status: AC
  Administered 2017-09-25: 13:00:00 via INTRAVENOUS

## 2017-09-25 MED ORDER — DIPHENOXYLATE-ATROPINE 2.5-0.025 MG PO TABS
2.0000 | ORAL_TABLET | Freq: Once | ORAL | Status: AC
  Administered 2017-09-25: 2 via ORAL

## 2017-09-25 MED ORDER — DIPHENOXYLATE-ATROPINE 2.5-0.025 MG PO TABS
2.0000 | ORAL_TABLET | Freq: Four times a day (QID) | ORAL | 0 refills | Status: DC | PRN
Start: 2017-09-25 — End: 2017-11-29

## 2017-09-25 MED ORDER — ONDANSETRON HCL 4 MG/2ML IJ SOLN
8.0000 mg | Freq: Once | INTRAMUSCULAR | Status: AC
  Administered 2017-09-25: 8 mg via INTRAVENOUS

## 2017-09-25 MED ORDER — SODIUM CHLORIDE 0.9 % IV SOLN: Freq: Once | INTRAVENOUS | Status: DC

## 2017-09-25 NOTE — Progress Notes (Addendum)
Manuel Olson   Diagnosis: Pancreas cancer  INTERVAL HISTORY:   Manuel Olson returns as scheduled.  He completed cycle 1 FOLFIRINOX 09/16/2017.  He was seen in the emergency department on 09/23/2017 with nausea/vomiting and diarrhea.  He received intravenous hydration with improvement and was discharged home.  He reports developing hiccups soon after the chemotherapy.  The hiccups improved with baclofen.  On day 4 he developed nausea/vomiting/diarrhea.  Compazine has been partially effective for the nausea.  Imodium has not helped the diarrhea.  His appetite is poor.  He has developed postprandial abdominal pain. He reports 10 pound weight loss.  He feels very weak.  Objective:  Vital signs in last 24 hours:  Blood pressure 105/66, pulse 84, temperature 98 F (36.7 C), temperature source Oral, resp. rate 18, height 5' 10"  (1.778 m), weight 161 lb 9.6 oz (73.3 kg), SpO2 100 %.     HEENT: Mouth appears dry.  No thrush.  No ulcers. Resp: Lungs clear bilaterally. Cardio: Regular rate and rhythm. GI: Abdomen is soft with mild generalized tenderness.  No hepatomegaly.  No mass. Vascular: No leg edema. Neuro: Alert and oriented. Skin: No rash. Port-A-Cath without erythema.   Lab Results:  Lab Results  Component Value Date   WBC 14.9 (H) 09/23/2017   HGB 12.9 (L) 09/23/2017   HCT 37.6 (L) 09/23/2017   MCV 89.1 09/23/2017   PLT 78 (L) 09/23/2017   NEUTROABS 13.2 (H) 09/23/2017    Imaging:  Dg Abd Acute W/chest  Result Date: 09/23/2017 CLINICAL DATA:  Nausea, vomiting and diarrhea. Undergoing chemotherapy for pancreatic cancer. Ex-smoker. EXAM: DG ABDOMEN ACUTE W/ 1V CHEST COMPARISON:  PET-CT dated 08/30/2017 and portable chest dated 09/15/2017. Abdomen MR dated 07/27/2017. Chest, abdomen and pelvis CT dated 07/20/2017. FINDINGS: Normal sized heart. Clear lungs. The lungs remain hyperexpanded. Stable left subclavian porta catheter with its tip in  the superior vena cava. Stable aortic valve stent. Thoracic spine degenerative changes. Normal bowel gas pattern without free peritoneal air. Hernia repair mesh markers in the right lower pelvic region. Lumbar spine degenerative changes at L2 vertebral compression deformity without significant change since 07/27/2017 and 07/20/2017. IMPRESSION: 1. No acute abnormality. 2. COPD. Electronically Signed   By: Claudie Revering M.D.   On: 09/23/2017 14:58    Medications: I have reviewed the patient's current medications.  Assessment/Plan: 1. Adenocarcinoma of the pancreas head/neck, status post an EUS biopsy 08/02/2017 confirming adenocarcinoma ? EUS 08/02/2017-pancreas head/neck mass, abutment of the portal vein, no peripancreatic adenopathy ? MRI abdomen 07/27/2017- head/neck pancreas mass with mass-effect on the portal splenic confluence, no involvement of the celiac axis or superior mesenteric artery, no evidence of metastatic disease ? PET scan 08/31/2017-very hypermetabolism at the pancreatic head mass, no evidence of metastatic disease.  The isolated right lower lobe nodule below PET resolution, left sided renal mass with decreased activity relative to the normal adjacent kidney ? Cycle 1 FOLFIRINOX 09/16/2017  2. 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   Port-A-Cath placement 09/15/2017, Dr. Barry Dienes    Disposition: Manuel Olson completed cycle 1 FOLFIRINOX 09/16/2017.  He subsequently developed nausea/vomiting/diarrhea, postprandial abdominal pain.  He received IV fluids in the emergency department over the weekend.  Symptoms have persisted.  He  will receive IV fluids and Zofran in the office today.  We will  give him a dose of Lomotil.  We are checking a CBC, chemistry panel, amylase and lipase.  He and his wife understand symptoms are most likely chemotherapy related toxicity.  We will reevaluate after the IV fluids.  If symptoms have improved we will discharge home and arrange for follow-up within 24 hours.  Addendum-Manuel Olson symptoms markedly improved following IV fluids, Zofran and Lomotil.  He was able to tolerate fluids by mouth as well.  He agrees to return for a follow-up visit tomorrow.  Patient seen with Dr. Benay Spice.  30 minutes were spent face-to-face at today's visit with the majority of that time involved in counseling/coordination of care.  [name] ANP/GNP-BC   09/25/2017  12:31 PM  This was a shared visit with [name].  Manuel Olson was interviewed and examined.  He is now day 10 following cycle 1 FOLFIRINOX.  He appears to have symptoms related to chemotherapy-induced enteritis and delayed nausea.  He will receive intravenous fluids and antiemetics today.  He will begin Lomotil.  He will return for further evaluation 09/26/2017.  Julieanne Manson, MD

## 2017-09-25 NOTE — Telephone Encounter (Signed)
Scheduled appt per 1/14 los - Gave patient AVS and calender per los.

## 2017-09-25 NOTE — Telephone Encounter (Signed)
Patient called regarding abdominal pain. Patient states "it feels like a blender in there". Patient wife states the patient iasnt eating or drinking much. The pain is "constant, but really bad when I eat or drink anything". Patient wife states he was just in the ED over the weekend for dehydration, "he got fluids and was feeling much better". Patient also feels "lethargic, I'm just really tired". Patient also made mention of "not taking any high blood sugar medication". Patient instructed to check BG and also check the insulin medications for instructions on when and how to take. Spoke with Dr. Benay Spice. Patient to be seen in clinic today. Patient voiced understanding and agreement with appt.

## 2017-09-26 ENCOUNTER — Inpatient Hospital Stay (HOSPITAL_COMMUNITY)
Admission: EM | Admit: 2017-09-26 | Discharge: 2017-10-02 | DRG: 391 | Disposition: A | Discharge status: Home / Self Care | Payer: Medicare Other | Attending: Internal Medicine | Admitting: Internal Medicine

## 2017-09-26 ENCOUNTER — Other Ambulatory Visit: Payer: Self-pay

## 2017-09-26 ENCOUNTER — Ambulatory Visit: Payer: Medicare Other | Admitting: Nurse Practitioner

## 2017-09-26 ENCOUNTER — Encounter (HOSPITAL_COMMUNITY): Payer: Self-pay

## 2017-09-26 ENCOUNTER — Telehealth: Payer: Self-pay | Admitting: Emergency Medicine

## 2017-09-26 DIAGNOSIS — D61810 Antineoplastic chemotherapy induced pancytopenia: Secondary | ICD-10-CM | POA: Diagnosis present

## 2017-09-26 DIAGNOSIS — Z7902 Long term (current) use of antithrombotics/antiplatelets: Secondary | ICD-10-CM

## 2017-09-26 DIAGNOSIS — A09 Infectious gastroenteritis and colitis, unspecified: Principal | ICD-10-CM | POA: Diagnosis present

## 2017-09-26 DIAGNOSIS — E119 Type 2 diabetes mellitus without complications: Secondary | ICD-10-CM | POA: Diagnosis present

## 2017-09-26 DIAGNOSIS — E440 Moderate protein-calorie malnutrition: Secondary | ICD-10-CM | POA: Diagnosis present

## 2017-09-26 DIAGNOSIS — D6481 Anemia due to antineoplastic chemotherapy: Secondary | ICD-10-CM | POA: Diagnosis present

## 2017-09-26 DIAGNOSIS — R109 Unspecified abdominal pain: Secondary | ICD-10-CM

## 2017-09-26 DIAGNOSIS — K521 Toxic gastroenteritis and colitis: Secondary | ICD-10-CM | POA: Diagnosis not present

## 2017-09-26 DIAGNOSIS — E876 Hypokalemia: Secondary | ICD-10-CM | POA: Diagnosis not present

## 2017-09-26 DIAGNOSIS — Z955 Presence of coronary angioplasty implant and graft: Secondary | ICD-10-CM

## 2017-09-26 DIAGNOSIS — D61818 Other pancytopenia: Secondary | ICD-10-CM | POA: Diagnosis not present

## 2017-09-26 DIAGNOSIS — I2510 Atherosclerotic heart disease of native coronary artery without angina pectoris: Secondary | ICD-10-CM | POA: Diagnosis present

## 2017-09-26 DIAGNOSIS — T451X5A Adverse effect of antineoplastic and immunosuppressive drugs, initial encounter: Secondary | ICD-10-CM | POA: Diagnosis present

## 2017-09-26 DIAGNOSIS — R197 Diarrhea, unspecified: Secondary | ICD-10-CM | POA: Diagnosis not present

## 2017-09-26 DIAGNOSIS — R627 Adult failure to thrive: Secondary | ICD-10-CM | POA: Diagnosis present

## 2017-09-26 DIAGNOSIS — K5000 Crohn's disease of small intestine without complications: Secondary | ICD-10-CM

## 2017-09-26 DIAGNOSIS — Z8673 Personal history of transient ischemic attack (TIA), and cerebral infarction without residual deficits: Secondary | ICD-10-CM

## 2017-09-26 DIAGNOSIS — I10 Essential (primary) hypertension: Secondary | ICD-10-CM | POA: Diagnosis present

## 2017-09-26 DIAGNOSIS — Z952 Presence of prosthetic heart valve: Secondary | ICD-10-CM

## 2017-09-26 DIAGNOSIS — E861 Hypovolemia: Secondary | ICD-10-CM | POA: Diagnosis present

## 2017-09-26 DIAGNOSIS — C250 Malignant neoplasm of head of pancreas: Secondary | ICD-10-CM | POA: Diagnosis present

## 2017-09-26 DIAGNOSIS — C641 Malignant neoplasm of right kidney, except renal pelvis: Secondary | ICD-10-CM | POA: Diagnosis present

## 2017-09-26 DIAGNOSIS — R110 Nausea: Secondary | ICD-10-CM | POA: Diagnosis not present

## 2017-09-26 DIAGNOSIS — K219 Gastro-esophageal reflux disease without esophagitis: Secondary | ICD-10-CM | POA: Diagnosis present

## 2017-09-26 DIAGNOSIS — E871 Hypo-osmolality and hyponatremia: Secondary | ICD-10-CM | POA: Diagnosis not present

## 2017-09-26 DIAGNOSIS — R112 Nausea with vomiting, unspecified: Secondary | ICD-10-CM | POA: Diagnosis not present

## 2017-09-26 DIAGNOSIS — Z79899 Other long term (current) drug therapy: Secondary | ICD-10-CM

## 2017-09-26 DIAGNOSIS — I252 Old myocardial infarction: Secondary | ICD-10-CM

## 2017-09-26 DIAGNOSIS — Z87891 Personal history of nicotine dependence: Secondary | ICD-10-CM

## 2017-09-26 DIAGNOSIS — Z7982 Long term (current) use of aspirin: Secondary | ICD-10-CM

## 2017-09-26 DIAGNOSIS — E860 Dehydration: Secondary | ICD-10-CM | POA: Diagnosis not present

## 2017-09-26 DIAGNOSIS — Z6823 Body mass index (BMI) 23.0-23.9, adult: Secondary | ICD-10-CM

## 2017-09-26 DIAGNOSIS — E785 Hyperlipidemia, unspecified: Secondary | ICD-10-CM | POA: Diagnosis present

## 2017-09-26 DIAGNOSIS — I350 Nonrheumatic aortic (valve) stenosis: Secondary | ICD-10-CM | POA: Diagnosis not present

## 2017-09-26 DIAGNOSIS — R140 Abdominal distension (gaseous): Secondary | ICD-10-CM

## 2017-09-26 DIAGNOSIS — R1084 Generalized abdominal pain: Secondary | ICD-10-CM

## 2017-09-26 DIAGNOSIS — D6959 Other secondary thrombocytopenia: Secondary | ICD-10-CM | POA: Diagnosis present

## 2017-09-26 DIAGNOSIS — Z794 Long term (current) use of insulin: Secondary | ICD-10-CM

## 2017-09-26 LAB — COMPREHENSIVE METABOLIC PANEL
ALT: 21 U/L (ref 17–63)
AST: 16 U/L (ref 15–41)
Albumin: 2.6 g/dL — ABNORMAL LOW (ref 3.5–5.0)
Alkaline Phosphatase: 65 U/L (ref 38–126)
Anion gap: 9 (ref 5–15)
BUN: 24 mg/dL — ABNORMAL HIGH (ref 6–20)
CO2: 21 mmol/L — ABNORMAL LOW (ref 22–32)
Calcium: 8.1 mg/dL — ABNORMAL LOW (ref 8.9–10.3)
Chloride: 99 mmol/L — ABNORMAL LOW (ref 101–111)
Creatinine, Ser: 1.05 mg/dL (ref 0.61–1.24)
GFR calc Af Amer: >60 mL/min (ref >60)
GFR calc non Af Amer: >60 mL/min (ref >60)
Glucose, Bld: 161 mg/dL — ABNORMAL HIGH (ref 65–99)
Potassium: 3.3 mmol/L — ABNORMAL LOW (ref 3.5–5.1)
Sodium: 129 mmol/L — ABNORMAL LOW (ref 135–145)
Total Bilirubin: 0.3 mg/dL (ref 0.3–1.2)
Total Protein: 5.1 g/dL — ABNORMAL LOW (ref 6.5–8.1)

## 2017-09-26 LAB — DIFFERENTIAL
Basophils Absolute: 0 10*3/uL (ref 0.0–0.1)
Basophils Relative: 0 %
Eosinophils Absolute: 0 10*3/uL (ref 0.0–0.7)
Eosinophils Relative: 1 %
Lymphocytes Relative: 14 %
Lymphs Abs: 0.4 10*3/uL — ABNORMAL LOW (ref 0.7–4.0)
Monocytes Absolute: 1.3 10*3/uL — ABNORMAL HIGH (ref 0.1–1.0)
Monocytes Relative: 45 %
Neutro Abs: 1.2 10*3/uL — ABNORMAL LOW (ref 1.7–7.7)
Neutrophils Relative %: 40 %

## 2017-09-26 LAB — CBC
HCT: 30.1 % — ABNORMAL LOW (ref 39.0–52.0)
Hemoglobin: 10.3 g/dL — ABNORMAL LOW (ref 13.0–17.0)
MCH: 30.4 pg (ref 26.0–34.0)
MCHC: 34.2 g/dL (ref 30.0–36.0)
MCV: 88.8 fL (ref 78.0–100.0)
Platelets: 86 10*3/uL — ABNORMAL LOW (ref 150–400)
RBC: 3.39 MIL/uL — ABNORMAL LOW (ref 4.22–5.81)
RDW: 13.5 % (ref 11.5–15.5)
WBC: 2.9 10*3/uL — ABNORMAL LOW (ref 4.0–10.5)

## 2017-09-26 LAB — URINALYSIS, ROUTINE W REFLEX MICROSCOPIC
Bilirubin Urine: NEGATIVE
Glucose, UA: 50 mg/dL — AB
Hgb urine dipstick: NEGATIVE
Ketones, ur: NEGATIVE mg/dL
Leukocytes, UA: NEGATIVE
Nitrite: NEGATIVE
Protein, ur: NEGATIVE mg/dL
Specific Gravity, Urine: 1.024 (ref 1.005–1.030)
pH: 5 (ref 5.0–8.0)

## 2017-09-26 LAB — MAGNESIUM: Magnesium: 1.5 mg/dL — ABNORMAL LOW (ref 1.7–2.4)

## 2017-09-26 LAB — PATHOLOGIST SMEAR REVIEW

## 2017-09-26 LAB — LIPASE, BLOOD: Lipase: <10 U/L — ABNORMAL LOW (ref 11–51)

## 2017-09-26 MED ORDER — BACLOFEN 10 MG PO TABS
5.0000 mg | ORAL_TABLET | Freq: Three times a day (TID) | ORAL | Status: DC | PRN
  Administered 2017-09-27 – 2017-10-01 (×2): 5 mg via ORAL
  Filled 2017-09-26 (×2): qty 1

## 2017-09-26 MED ORDER — POTASSIUM CHLORIDE 10 MEQ/100ML IV SOLN
10.0000 meq | INTRAVENOUS | Status: AC
  Administered 2017-09-26 (×3): 10 meq via INTRAVENOUS
  Filled 2017-09-26 (×3): qty 100

## 2017-09-26 MED ORDER — LOPERAMIDE HCL 2 MG PO CAPS
2.0000 mg | ORAL_CAPSULE | ORAL | Status: DC | PRN
  Administered 2017-09-26 – 2017-09-28 (×3): 2 mg via ORAL
  Filled 2017-09-26 (×4): qty 1

## 2017-09-26 MED ORDER — MAGNESIUM SULFATE 2 GM/50ML IV SOLN
2.0000 g | Freq: Once | INTRAVENOUS | Status: AC
  Administered 2017-09-26: 2 g via INTRAVENOUS
  Filled 2017-09-26: qty 50

## 2017-09-26 MED ORDER — ACETAMINOPHEN 325 MG PO TABS
650.0000 mg | ORAL_TABLET | Freq: Four times a day (QID) | ORAL | Status: DC | PRN
  Administered 2017-09-26: 650 mg via ORAL
  Filled 2017-09-26: qty 2

## 2017-09-26 MED ORDER — LACTATED RINGERS IV SOLN
INTRAVENOUS | Status: DC
  Administered 2017-09-26: 13:00:00 via INTRAVENOUS

## 2017-09-26 MED ORDER — ONDANSETRON HCL 4 MG/2ML IJ SOLN
4.0000 mg | Freq: Once | INTRAMUSCULAR | Status: AC
  Administered 2017-09-26: 4 mg via INTRAVENOUS
  Filled 2017-09-26: qty 2

## 2017-09-26 MED ORDER — CLOPIDOGREL BISULFATE 75 MG PO TABS
75.0000 mg | ORAL_TABLET | Freq: Every day | ORAL | Status: DC
  Administered 2017-09-26 – 2017-10-02 (×7): 75 mg via ORAL
  Filled 2017-09-26 (×7): qty 1

## 2017-09-26 MED ORDER — ONDANSETRON HCL 8 MG PO TABS
8.0000 mg | ORAL_TABLET | Freq: Three times a day (TID) | ORAL | Status: DC | PRN
  Filled 2017-09-26: qty 1

## 2017-09-26 MED ORDER — MAGNESIUM OXIDE 400 (241.3 MG) MG PO TABS
400.0000 mg | ORAL_TABLET | Freq: Two times a day (BID) | ORAL | Status: DC
  Administered 2017-09-26 – 2017-10-02 (×7): 400 mg via ORAL
  Filled 2017-09-26 (×10): qty 1

## 2017-09-26 MED ORDER — DIPHENOXYLATE-ATROPINE 2.5-0.025 MG PO TABS
2.0000 | ORAL_TABLET | Freq: Four times a day (QID) | ORAL | Status: DC | PRN
  Administered 2017-09-27 – 2017-09-28 (×3): 2 via ORAL
  Filled 2017-09-26 (×3): qty 2

## 2017-09-26 MED ORDER — DIPHENOXYLATE-ATROPINE 2.5-0.025 MG PO TABS: 1.0000 | ORAL_TABLET | Freq: Four times a day (QID) | ORAL | Status: DC | PRN

## 2017-09-26 MED ORDER — LISINOPRIL 10 MG PO TABS
10.0000 mg | ORAL_TABLET | Freq: Every day | ORAL | Status: DC
  Administered 2017-09-26 – 2017-10-02 (×7): 10 mg via ORAL
  Filled 2017-09-26 (×7): qty 1

## 2017-09-26 MED ORDER — ASPIRIN EC 81 MG PO TBEC
81.0000 mg | DELAYED_RELEASE_TABLET | Freq: Every day | ORAL | Status: DC
  Administered 2017-09-26 – 2017-10-02 (×7): 81 mg via ORAL
  Filled 2017-09-26 (×7): qty 1

## 2017-09-26 MED ORDER — POTASSIUM CHLORIDE IN NACL 20-0.9 MEQ/L-% IV SOLN
INTRAVENOUS | Status: AC
  Administered 2017-09-26: 23:00:00 via INTRAVENOUS
  Filled 2017-09-26: qty 1000

## 2017-09-26 MED ORDER — PROCHLORPERAZINE MALEATE 10 MG PO TABS: 5.0000 mg | ORAL_TABLET | Freq: Four times a day (QID) | ORAL | Status: DC | PRN

## 2017-09-26 MED ORDER — ATORVASTATIN CALCIUM 20 MG PO TABS
20.0000 mg | ORAL_TABLET | Freq: Every day | ORAL | Status: DC
  Administered 2017-09-26 – 2017-10-02 (×7): 20 mg via ORAL
  Filled 2017-09-26 (×7): qty 1

## 2017-09-26 MED ORDER — SODIUM CHLORIDE 0.9 % IV BOLUS (SEPSIS)
1000.0000 mL | Freq: Once | INTRAVENOUS | Status: AC
  Administered 2017-09-26: 1000 mL via INTRAVENOUS

## 2017-09-26 NOTE — ED Triage Notes (Signed)
Patient c/o abdominal pain, diarrhea, and emesis x 1 weekPatient had Lomotil x 2 prior to arriving to the ED.

## 2017-09-26 NOTE — H&P (Signed)
History and Physical    CONSTANTIN HILLERY GUY:[id number] DOB: 1946/05/11 DOA: 09/26/2017  PCP: Satira Sark, MD  Patient coming from: Home  Chief Complaint: Nausea not eating well  HPI: Manuel Olson is a 72 y.o. male with medical history significant of pancreatic cancer on chemotherapy with Dr. Johnanna Schneiders, hypertension, GERD, status post aortic valve replacement, diabetes, comes in with over a week of nausea occasionally vomiting and not eating well.  Patient is not doing well with his chemotherapy treatments.  He has been in the oncology office several times this past week for IV fluids.  He denies any  fevers.  He has not had any worsening of his abdominal pain.  Denies any urinary symptoms.  No cough.  No shortness of breath.  No chest pain.  Patient is referred for admission for dehydration and IV fluids.  Review of Systems: As per HPI otherwise 10 point review of systems negative.   Past Medical History:  Diagnosis Date  . Arthritis   . CAD (coronary artery disease)    a. 11/2016 in [place]: STEMI s/p DES to mid LAD, DES to diagonal, DES x 2 to proximal and mid RCA   . Essential hypertension   . Former smoker   . GERD (gastroesophageal reflux disease)   . Hyperlipidemia   . Pancreatic cancer ([place])   . Renal mass   . S/P TAVR (transcatheter aortic valve replacement)    a. 08/2017:  Edwards Sapien 3 THV (size 26 mm, model #9600CM26A , serial # [id number])  . Severe aortic stenosis    a. 08/2017: s/p TAVR by Dr. Angelena Form and Dr. Cyndia Bent.   . 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 Type 2 diabetes mellitus ([place])     Past Surgical History:  Procedure Laterality Date  . APPENDECTOMY    . EUS N/A 08/02/2017   Procedure: UPPER ENDOSCOPIC ULTRASOUND (EUS) RADIAL;  Surgeon: [name], MD;  Location: [facility];  Service: Endoscopy;  Laterality: N/A;  . EYE SURGERY Left   . HAND SURGERY Bilateral   . HERNIA REPAIR Right   . PORTACATH PLACEMENT N/A 09/15/2017   Procedure:  INSERTION PORT-A-CATH;  Surgeon: [name], MD;  Location: [place];  Service: General;  Laterality: N/A;  . RIGHT/LEFT HEART CATH AND CORONARY ANGIOGRAPHY N/A 07/05/2017   Procedure: RIGHT/LEFT HEART CATH AND CORONARY ANGIOGRAPHY;  Surgeon: [name], MD;  Location: [place] [facility];  Service: Cardiovascular;  Laterality: N/A;  . SHOULDER ARTHROSCOPY WITH ROTATOR CUFF REPAIR Left   . TEE WITHOUT CARDIOVERSION N/A 08/22/2017   Procedure: TRANSESOPHAGEAL ECHOCARDIOGRAM (TEE);  Surgeon: Burnell Blanks, MD;  Location: [place];  Service: Open Heart Surgery;  Laterality: N/A;  . TRANSCATHETER AORTIC VALVE REPLACEMENT, TRANSFEMORAL N/A 08/22/2017   Procedure: TRANSCATHETER AORTIC VALVE REPLACEMENT, TRANSFEMORAL;  Surgeon: Burnell Blanks, MD;  Location: [place];  Service: Open Heart Surgery;  Laterality: N/A;     reports that he quit smoking about 17 months ago. His smoking use included cigarettes. He started smoking about 59 years ago. He has a 57.00 pack-year smoking history. he has never used smokeless tobacco. He reports that he does not drink alcohol or use drugs.  No Known Allergies  Family History  Problem Relation Age of Onset  . Lupus Mother   . CAD Brother   . Hypertension Brother     Prior to Admission medications   Medication Sig Start Date End Date Taking? Authorizing Provider  aspirin EC 81 MG tablet Take 1 tablet (81 mg total) by mouth daily. 01/26/17  Yes Satira Sark, MD  atorvastatin (LIPITOR) 20 MG tablet Take 1 tablet (20 mg total) by mouth daily. 08/25/17  Yes Satira Sark, MD  baclofen (LIORESAL) 10 MG tablet Take 0.5 tablets (5 mg total) by mouth 3 (three) times daily as needed for muscle spasms. 09/18/17  Yes Ladell Pier, MD  carvedilol (COREG) 3.125 MG tablet TAKE (1) TABLET BY MOUTH TWICE A DAY WITH MEALS (BREAKFAST AND SUPPER) 09/11/17  Yes Satira Sark, MD  clopidogrel (PLAVIX) 75 MG tablet Take 1 tablet  (75 mg total) by mouth daily. 07/06/17  Yes Satira Sark, MD  diphenoxylate-atropine (LOMOTIL) 2.5-0.025 MG tablet Take 2 tablets by mouth 4 (four) times daily as needed for diarrhea or loose stools. 09/25/17  Yes Owens Shark, NP  lidocaine-prilocaine (EMLA) cream Apply 1 application topically as needed. Apply small amount of cream over port site 1-2 hrs prior to chemo, cover with plastic wrap. Patient taking differently: Apply 1 application topically as needed (for recieving chemo in port site). Apply small amount of cream over port site 1-2 hrs prior to chemo, cover with plastic wrap. 09/07/17  Yes Owens Shark, NP  lisinopril (PRINIVIL,ZESTRIL) 10 MG tablet TAKE 1 TABLET BY MOUTH ONCE DAILY. 09/11/17  Yes Satira Sark, MD  ondansetron (ZOFRAN) 8 MG tablet Take 1 tablet (8 mg total) by mouth every 8 (eight) hours as needed for nausea or vomiting. 09/25/17  Yes Owens Shark, NP  prochlorperazine (COMPAZINE) 5 MG tablet Take 1 tablet (5 mg total) by mouth every 6 (six) hours as needed for nausea or vomiting. 09/07/17  Yes Owens Shark, NP  Blood Glucose Monitoring Suppl (ONETOUCH VERIO) w/Device KIT 1 each by Does not apply route as needed. Patient not taking: Reported on 09/26/2017 09/01/17   Cassandria Anger, MD  glucose blood [facility] VERIO) test strip Use as instructed Patient not taking: Reported on 09/26/2017 09/01/17   Cassandria Anger, MD  insulin aspart (FIASP FLEXTOUCH) 100 UNIT/ML FlexPen Inject 10-16 Units into the skin 3 (three) times daily with meals. Patient not taking: Reported on 09/26/2017 09/01/17   Cassandria Anger, MD  insulin degludec (TRESIBA) 100 UNIT/ML SOPN FlexTouch Pen Inject 0.4 mLs (40 Units total) into the skin daily at 10 pm. Patient not taking: Reported on 09/26/2017 09/01/17   Cassandria Anger, MD  Insulin Pen Needle (B-D ULTRAFINE III SHORT PEN) 31G X 8 MM MISC 1 each by Does not apply route as directed. Patient not taking:  Reported on 09/26/2017 08/25/17   Cassandria Anger, MD  oxyCODONE (OXY IR/ROXICODONE) 5 MG immediate release tablet Take 0.5-1 tablets (2.5-5 mg total) by mouth every 6 (six) hours as needed for severe pain or breakthrough pain. Patient not taking: Reported on 09/26/2017 09/15/17   [name], MD    Physical Exam: Vitals:   09/26/17 0934 09/26/17 0940 09/26/17 1055 09/26/17 1215  BP: (!) 120/59  (!) 120/53 (!) 119/47  Pulse: 86  77 77  Resp: 13  16 12   Temp: 98.3 F (36.8 C)     TempSrc: Oral     SpO2: 96%  95% 96%  Weight:  73 kg (161 lb)    Height:  5' 10"  (1.778 m)        Constitutional: NAD, calm, comfortable Vitals:   09/26/17 0934 09/26/17 0940 09/26/17 1055 09/26/17 1215  BP: (!) 120/59  [name])  120/53 (!) 119/47  Pulse: 86  77 77  Resp: 13  16 12   Temp: 98.3 F (36.8 C)     TempSrc: Oral     SpO2: 96%  95% 96%  Weight:  73 kg (161 lb)    Height:  5' 10"  (1.778 m)     Eyes: PERRL, lids and conjunctivae normal ENMT: Mucous membranes are dry. Posterior pharynx clear of any exudate or lesions.Normal dentition.  Neck: normal, supple, no masses, no thyromegaly Respiratory: clear to auscultation bilaterally, no wheezing, no crackles. Normal respiratory effort. No accessory muscle use.  Cardiovascular: Regular rate and rhythm, no murmurs / rubs / gallops. No extremity edema. 2+ pedal pulses. No carotid bruits.  Abdomen: no tenderness, no masses palpated. No hepatosplenomegaly. Bowel sounds positive.  Musculoskeletal: no clubbing / cyanosis. No joint deformity upper and lower extremities. Good ROM, no contractures. Normal muscle tone.  Skin: no rashes, lesions, ulcers. No induration Neurologic: CN 2-12 grossly intact. Sensation intact, DTR normal. Strength 5/5 in all 4.  Psychiatric: Normal judgment and insight. Alert and oriented x 3. Normal mood.    Labs on Admission: I have personally reviewed following labs and imaging studies  CBC: Recent Labs  Lab 09/23/17 1401  09/25/17 1301 09/26/17 0943  WBC 14.9*  --  2.9*  NEUTROABS 13.2* 1.7 1.2*  HGB 12.9*  --  10.3*  HCT 37.6* 34.4* 30.1*  MCV 89.1 90.2 88.8  PLT 78*  --  86*   Basic Metabolic Panel: Recent Labs  Lab 09/23/17 1401 09/25/17 1301 09/26/17 0943  NA 127* 130* 129*  K 3.9 3.7 3.3*  CL 96* 101 99*  CO2 22 21* 21*  GLUCOSE 322* 208* 161*  BUN 42* 29* 24*  CREATININE 1.54*  --  1.05  CALCIUM 8.5* 8.5 8.1*  MG  --   --  1.5*   GFR: Estimated Creatinine Clearance: 64.7 mL/min (by C-G formula based on SCr of 1.05 mg/dL). Liver Function Tests: Recent Labs  Lab 09/23/17 1401 09/25/17 1301 09/26/17 0943  AST 13* 12 16  ALT 11* 26 21  ALKPHOS 119 87 65  BILITOT 1.0 0.3 0.3  PROT 5.9* 5.5* 5.1*  ALBUMIN 3.3* 2.8* 2.6*   Recent Labs  Lab 09/25/17 1301 09/26/17 0943  LIPASE <10* <10*  AMYLASE 15*  --    No results for input(s): AMMONIA in the last 168 hours. Coagulation Profile: No results for input(s): INR, PROTIME in the last 168 hours. Cardiac Enzymes: No results for input(s): CKTOTAL, CKMB, CKMBINDEX, TROPONINI in the last 168 hours. BNP (last 3 results) No results for input(s): PROBNP in the last 8760 hours. HbA1C: No results for input(s): HGBA1C in the last 72 hours. CBG: No results for input(s): GLUCAP in the last 168 hours. Lipid Profile: No results for input(s): CHOL, HDL, LDLCALC, TRIG, CHOLHDL, LDLDIRECT in the last 72 hours. Thyroid Function Tests: No results for input(s): TSH, T4TOTAL, FREET4, T3FREE, THYROIDAB in the last 72 hours. Anemia Panel: No results for input(s): VITAMINB12, FOLATE, FERRITIN, TIBC, IRON, RETICCTPCT in the last 72 hours. Urine analysis:    Component Value Date/Time   COLORURINE YELLOW 08/17/2017 [place] 08/17/2017 0851   LABSPEC 1.020 08/17/2017 0851   PHURINE 6.0 08/17/2017 0851   GLUCOSEU >=500 (A) 08/17/2017 0851   HGBUR NEGATIVE 08/17/2017 [place] 08/17/2017 [place] 08/17/2017 0851   PROTEINUR NEGATIVE 08/17/2017 0851   NITRITE NEGATIVE 08/17/2017 0851   LEUKOCYTESUR NEGATIVE 08/17/2017  0851   Sepsis Labs: !!!!!!!!!!!!!!!!!!!!!!!!!!!!!!!!!!!!!!!!!!!! @LABRCNTIP (procalcitonin:4,lacticidven:4) )No results found for this or any previous visit (from the past 240 hour(s)).   Radiological Exams on Admission: No results found.  Old chart reviewed Case discussed with ED P  Assessment/Plan 72 year old male with pancreatic cancer on chemo comes in with dehydration and failure to thrive Principal Problem:   Dehydration-V fluids Active Problems:   Severe aortic stenosis-stable   Cancer of head of pancreas (HCC)-noted Dr. Johnanna Schneiders be seen this afternoon   S/P TAVR (transcatheter aortic valve replacement)-stable   Essential hypertension, benign-going to hold his Coreg at this time and continue lisinopril   CAD (coronary artery disease)-stable   FTT (failure to thrive) in adult-patient not tolerating chemotherapy very well further management per oncology team     DVT prophylaxis: SCDs Code Status: Full Family Communication: None Disposition Plan: Per day team Consults called: Oncology Dr. Johnanna Schneiders Admission status: Observation   Madi Bonfiglio A MD [facility]  If 7PM-7AM, please contact night-coverage [email or web address] Password [email or web address]  09/26/2017, 1:20 PM

## 2017-09-26 NOTE — Progress Notes (Signed)
IP PROGRESS NOTE  Subjective:   Manuel Olson was treated with a first cycle of FOLFIRINOX on 09/16/2017.  He was seen in the office yesterday with nausea/vomiting/diarrhea.  His clinical status improved after intravenous hydration, Zofran, and Lomotil.  He was tolerating liquids and was discharged to home. His wife reports he developed diarrhea in the early a.m. today.  He was too weak to walk and presented to the emergency room for further evaluation.  He vomited in route to the emergency room.  He had an episode of diarrhea in the emergency room. He complains of abdominal pain.  No nausea at present.  Objective: Vital signs in last 24 hours: Blood pressure 136/68, pulse 73, temperature 98.3 F (36.8 C), temperature source Oral, resp. rate 20, height 5' 10"  (1.778 m), weight 161 lb (73 kg), SpO2 96 %.  Intake/Output from previous day: No intake/output data recorded.  Physical Exam:  HEENT: No thrush or ulcers  Lungs: Distant breath sounds, no respiratory distress Cardiac: Regular rate and rhythm Abdomen: Soft, mild diffuse tenderness, active bowel sounds Extremities: No leg edema Neurologic: Alert and oriented  Portacath/PICC-without erythema  Lab Results: Recent Labs    09/25/17 1301 09/26/17 0943  WBC  --  2.9*  HGB  --  10.3*  HCT 34.4* 30.1*  PLT  --  86*    BMET Recent Labs    09/25/17 1301 09/26/17 0943  NA 130* 129*  K 3.7 3.3*  CL 101 99*  CO2 21* 21*  GLUCOSE 208* 161*  BUN 29* 24*  CREATININE  --  1.05  CALCIUM 8.5 8.1*    No results found for: CEA1  Studies/Results: No results found.  Medications: I have reviewed the patient's current medications.  Assessment/Plan: 1. Adenocarcinoma of the pancreas head/neck, status post an EUS biopsy 08/02/2017 confirming adenocarcinoma ? EUS 08/02/2017-pancreas head/neck mass, abutment of the portal vein, no peripancreatic adenopathy ? MRI abdomen 07/27/2017- head/neck pancreas mass with mass-effect on the  portal splenic confluence, no involvement of the celiac axis or superior mesenteric artery, no evidence of metastatic disease ? PET scan 08/31/2017-very hypermetabolism at the pancreatic head mass, no evidence of metastatic disease. The isolated right lower lobe nodule below PET resolution, left sided renal mass with decreased activity relative to the normal adjacent kidney ? Cycle 1 FOLFIRINOX 09/16/2017  2. 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   Port-A-Cath placement 09/15/2017, Dr. Barry Dienes  9.   Nausea and diarrhea secondary to chemotherapy enteritis  10.  Dehydration  11.  Pancytopenia secondary to chemotherapy  Manuel Olson presents with diarrhea, nausea, and abdominal pain at day 11 following cycle 1 FOLFIRINOX.  I suspect his symptoms are secondary to chemotherapy-induced enteritis.  He has developed pancytopenia secondary to chemotherapy.  I have a low clinical suspicion for a C. difficile infection, but we will need to consider this if the diarrhea persists.  Recommendations: 1.   Admit to internal medicine service for intravenous hydration and symptom management 2.   Anti-emetics as needed 3.   Check CBC 09/27/2017 4.   Replete potassium and magnesium    LOS: 0 days   [name], MD   09/26/2017, 5:18 PM

## 2017-09-26 NOTE — Telephone Encounter (Signed)
Pts wife called this am to inform Dr.sherrill that patient is much worse than yesterday when pt came in for fluids and to see [name], NP. Pt wife describes patient as not being able to focus or stand. Minimal diarrhea. Unable to eat or drink. Dr.Sherrill recommended patient go to ER in stead of coming into office. Patient verbalized understanding of this.

## 2017-09-26 NOTE — ED Notes (Signed)
Dr. Benay Spice at bedside speaking with patient.

## 2017-09-26 NOTE — ED Notes (Signed)
Bed: HT09 Expected date:  Expected time:  Means of arrival:  Comments: tri1

## 2017-09-26 NOTE — ED Provider Notes (Signed)
[facility] DEPT Provider Note   CSN: [id number] Arrival date & time: 09/26/17  6948     History   Chief Complaint Chief Complaint  Patient presents with  . Abdominal Pain  . Cancer patient  . Emesis  . Diarrhea    HPI Manuel Olson is a 72 y.o. male.   Abdominal Pain   Associated symptoms include diarrhea and vomiting. Pertinent negatives include fever, nausea, dysuria and headaches.  Emesis   Associated symptoms include abdominal pain and diarrhea. Pertinent negatives include no chills, no cough, no fever and no headaches.  Diarrhea   Associated symptoms include abdominal pain and vomiting. Pertinent negatives include no chills, no headaches and no cough.    72 year old male with history of recently diagnosed pancreatic cancer, currently on his first treatment of chemotherapy with Dr. Malachy Mood, who presents with nausea, vomiting, and dehydration.  Patient has a poorly had persistent nausea and vomiting since initiating chemotherapy.  He has been seen twice and given IV fluids, most recently on 1/12.  He was given treatment yesterday and since then has had intractable nausea, vomiting, and diffuse abdominal pain.  This pain is been ongoing since he started treatment.  He has not had any fevers.  He is also had significantly loose stools and was started on Lomotil yesterday, which has not improved this.  Describes his pain is diffuse, aching, and cramp-like.  Denies any significant alleviating factors.  Wife states that he has had little to no p.o. intake and has such will become increasingly weak and is essentially unable to walk due to feeling so tired.  He has been sleeping more than usual.  Denies any headaches.  Past Medical History:  Diagnosis Date  . Arthritis   . CAD (coronary artery disease)    a. 11/2016 in [place]: STEMI s/p DES to mid LAD, DES to diagonal, DES x 2 to proximal and mid RCA   . Essential hypertension   . Former smoker   .  GERD (gastroesophageal reflux disease)   . Hyperlipidemia   . Pancreatic cancer ([place])   . Renal mass   . S/P TAVR (transcatheter aortic valve replacement)    a. 08/2017:  Edwards Sapien 3 THV (size 26 mm, model #9600CM26A , serial # [id number])  . Severe aortic stenosis    a. 08/2017: s/p TAVR by Dr. Angelena Form and Dr. Cyndia Bent.   . 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 Type 2 diabetes mellitus [facility])     Patient Active Problem List   Diagnosis Date Noted  . DM type 2 causing vascular disease ([place]) 08/25/2017  . Mixed hyperlipidemia 08/25/2017  . Essential hypertension, benign   . Pancreatic cancer ([place])   . Renal mass   . CAD (coronary artery disease)   . Former smoker   . S/P TAVR (transcatheter aortic valve replacement) 08/22/2017  . Cancer of head of pancreas ([place]) 08/10/2017  . Severe aortic stenosis 07/05/2017    Past Surgical History:  Procedure Laterality Date  . APPENDECTOMY    . EUS N/A 08/02/2017   Procedure: UPPER ENDOSCOPIC ULTRASOUND (EUS) RADIAL;  Surgeon: [name], MD;  Location: [facility];  Service: Endoscopy;  Laterality: N/A;  . EYE SURGERY Left   . HAND SURGERY Bilateral   . HERNIA REPAIR Right   . PORTACATH PLACEMENT N/A 09/15/2017   Procedure: INSERTION PORT-A-CATH;  Surgeon: [name], MD;  Location: [facility];  Service: General;  Laterality: N/A;  .  RIGHT/LEFT HEART CATH AND CORONARY ANGIOGRAPHY N/A 07/05/2017   Procedure: RIGHT/LEFT HEART CATH AND CORONARY ANGIOGRAPHY;  Surgeon: [name], MD;  Location: [facility] CV LAB;  Service: Cardiovascular;  Laterality: N/A;  . SHOULDER ARTHROSCOPY WITH ROTATOR CUFF REPAIR Left   . TEE WITHOUT CARDIOVERSION N/A 08/22/2017   Procedure: TRANSESOPHAGEAL ECHOCARDIOGRAM (TEE);  Surgeon: Burnell Blanks, MD;  Location: [facility];  Service: Open Heart Surgery;  Laterality: N/A;  . TRANSCATHETER AORTIC VALVE REPLACEMENT, TRANSFEMORAL N/A 08/22/2017   Procedure: TRANSCATHETER AORTIC  VALVE REPLACEMENT, TRANSFEMORAL;  Surgeon: Burnell Blanks, MD;  Location: [facility];  Service: Open Heart Surgery;  Laterality: N/A;       Home Medications    Prior to Admission medications   Medication Sig Start Date End Date Taking? Authorizing Provider  aspirin EC 81 MG tablet Take 1 tablet (81 mg total) by mouth daily. 01/26/17  Yes Satira Sark, MD  atorvastatin (LIPITOR) 20 MG tablet Take 1 tablet (20 mg total) by mouth daily. 08/25/17  Yes Satira Sark, MD  baclofen (LIORESAL) 10 MG tablet Take 0.5 tablets (5 mg total) by mouth 3 (three) times daily as needed for muscle spasms. 09/18/17  Yes Ladell Pier, MD  carvedilol (COREG) 3.125 MG tablet TAKE (1) TABLET BY MOUTH TWICE A DAY WITH MEALS (BREAKFAST AND SUPPER) 09/11/17  Yes Satira Sark, MD  clopidogrel (PLAVIX) 75 MG tablet Take 1 tablet (75 mg total) by mouth daily. 07/06/17  Yes Satira Sark, MD  diphenoxylate-atropine (LOMOTIL) 2.5-0.025 MG tablet Take 2 tablets by mouth 4 (four) times daily as needed for diarrhea or loose stools. 09/25/17  Yes Owens Shark, NP  lidocaine-prilocaine (EMLA) cream Apply 1 application topically as needed. Apply small amount of cream over port site 1-2 hrs prior to chemo, cover with plastic wrap. Patient taking differently: Apply 1 application topically as needed (for recieving chemo in port site). Apply small amount of cream over port site 1-2 hrs prior to chemo, cover with plastic wrap. 09/07/17  Yes Owens Shark, NP  lisinopril (PRINIVIL,ZESTRIL) 10 MG tablet TAKE 1 TABLET BY MOUTH ONCE DAILY. 09/11/17  Yes Satira Sark, MD  ondansetron (ZOFRAN) 8 MG tablet Take 1 tablet (8 mg total) by mouth every 8 (eight) hours as needed for nausea or vomiting. 09/25/17  Yes Owens Shark, NP  prochlorperazine (COMPAZINE) 5 MG tablet Take 1 tablet (5 mg total) by mouth every 6 (six) hours as needed for nausea or vomiting. 09/07/17  Yes Owens Shark, NP  Blood Glucose  Monitoring Suppl (ONETOUCH VERIO) w/Device KIT 1 each by Does not apply route as needed. Patient not taking: Reported on 09/26/2017 09/01/17   Cassandria Anger, MD  glucose blood [facility] VERIO) test strip Use as instructed Patient not taking: Reported on 09/26/2017 09/01/17   Cassandria Anger, MD  insulin aspart (FIASP FLEXTOUCH) 100 UNIT/ML FlexPen Inject 10-16 Units into the skin 3 (three) times daily with meals. Patient not taking: Reported on 09/26/2017 09/01/17   Cassandria Anger, MD  insulin degludec (TRESIBA) 100 UNIT/ML SOPN FlexTouch Pen Inject 0.4 mLs (40 Units total) into the skin daily at 10 pm. Patient not taking: Reported on 09/26/2017 09/01/17   Cassandria Anger, MD  Insulin Pen Needle (B-D ULTRAFINE III SHORT PEN) 31G X 8 MM MISC 1 each by Does not apply route as directed. Patient not taking: Reported on 09/26/2017 08/25/17   Cassandria Anger, MD  oxyCODONE (OXY IR/ROXICODONE) 5  MG immediate release tablet Take 0.5-1 tablets (2.5-5 mg total) by mouth every 6 (six) hours as needed for severe pain or breakthrough pain. Patient not taking: Reported on 09/26/2017 09/15/17   [name], MD    Family History Family History  Problem Relation Age of Onset  . Lupus Mother   . CAD Brother   . Hypertension Brother     Social History Social History   Tobacco Use  . Smoking status: Former Smoker    Packs/day: 1.00    Years: 57.00    Pack years: 57.00    Types: Cigarettes    Start date: 09/12/1958    Last attempt to quit: 04/12/2016    Years since quitting: 1.4  . Smokeless tobacco: Never Used  Substance Use Topics  . Alcohol use: No  . Drug use: No     Allergies   Patient has no known allergies.   Review of Systems Review of Systems  Constitutional: Positive for fatigue. Negative for chills and fever.  HENT: Negative for congestion and rhinorrhea.   Eyes: Negative for visual disturbance.  Respiratory: Negative for cough, shortness of breath and  wheezing.   Cardiovascular: Negative for chest pain and leg swelling.  Gastrointestinal: Positive for abdominal pain, diarrhea and vomiting. Negative for nausea.  Genitourinary: Negative for dysuria and flank pain.  Musculoskeletal: Negative for neck pain and neck stiffness.  Skin: Negative for rash and wound.  Allergic/Immunologic: Negative for immunocompromised state.  Neurological: Positive for weakness. Negative for syncope and headaches.  All other systems reviewed and are negative.    Physical Exam Updated Vital Signs BP (!) 120/53 (BP Location: Right Arm)   Pulse 77   Temp 98.3 F (36.8 C) (Oral)   Resp 16   Ht _0  (1.778 m)   Wt 73 kg (161 lb)   SpO2 95%   BMI 23.10 kg/m   Physical Exam  Constitutional: He is oriented to person, place, and time. He appears well-developed and well-nourished. No distress.  Cachectic, chronically ill appearing  HENT:  Head: Normocephalic and atraumatic.  Markedly dry mm  Eyes: Conjunctivae are normal.  Neck: Neck supple.  Cardiovascular: Normal rate, regular rhythm and normal heart sounds. Exam reveals no friction rub.  No murmur heard. Pulmonary/Chest: Effort normal and breath sounds normal. No respiratory distress. He has no wheezes. He has no rales.  Abdominal: Normal appearance. He exhibits no distension. There is generalized tenderness. There is guarding. There is no rigidity and no rebound.  Musculoskeletal: He exhibits no edema.  Neurological: He is alert and oriented to person, place, and time. He exhibits normal muscle tone.  Skin: Skin is warm. Capillary refill takes less than 2 seconds.  Psychiatric: He has a normal mood and affect.  Nursing note and vitals reviewed.    ED Treatments / Results  Labs (all labs ordered are listed, but only abnormal results are displayed) Labs Reviewed  LIPASE, BLOOD - Abnormal; Notable for the following components:      Result Value   Lipase <10 (*)    All other components within  normal limits  COMPREHENSIVE METABOLIC PANEL - Abnormal; Notable for the following components:   Sodium 129 (*)    Potassium 3.3 (*)    Chloride 99 (*)    CO2 21 (*)    Glucose, Bld 161 (*)    BUN 24 (*)    Calcium 8.1 (*)    Total Protein 5.1 (*)    Albumin 2.6 (*)  All other components within normal limits  CBC - Abnormal; Notable for the following components:   WBC 2.9 (*)    RBC 3.39 (*)    Hemoglobin 10.3 (*)    HCT 30.1 (*)    Platelets 86 (*)    All other components within normal limits  MAGNESIUM - Abnormal; Notable for the following components:   Magnesium 1.5 (*)    All other components within normal limits  DIFFERENTIAL - Abnormal; Notable for the following components:   Neutro Abs 1.2 (*)    Lymphs Abs 0.4 (*)    Monocytes Absolute 1.3 (*)    All other components within normal limits  URINALYSIS, ROUTINE W REFLEX MICROSCOPIC    EKG  EKG Interpretation None       Radiology No results found.  Procedures Procedures (including critical care time)  Medications Ordered in ED Medications  magnesium sulfate IVPB 2 g 50 mL (2 g Intravenous New Bag/Given 09/26/17 1143)  potassium chloride 10 mEq in 100 mL IVPB (not administered)  lactated ringers infusion (not administered)  sodium chloride 0.9 % bolus 1,000 mL (1,000 mLs Intravenous New Bag/Given 09/26/17 1055)  ondansetron (ZOFRAN) injection 4 mg (4 mg Intravenous Given 09/26/17 1143)     Initial Impression / Assessment and Plan / ED Course  I have reviewed the triage vital signs and the nursing notes.  Pertinent labs & imaging results that were available during my care of the patient were reviewed by me and considered in my medical decision making (see chart for details).     72 year old male with history of recently diagnosed pancreatic cancer, currently on chemotherapy, who presents with nausea, vomiting, and diarrhea.  I suspect this is due to his chemotherapy and has been an ongoing issue since  starting.  He just received IV fluids yesterday and has had significant difficulty tolerating p.o. since then.  He has persistent hypokalemia and hypomagnesemia here and is markedly dry on exam.  Will give the patient fluids, antiemetics, and reassess.  Improving with IV fluids.  Due to his persistent nausea and vomiting despite multiple doses of outpatient antiemetics and fluids, will admit for ongoing hydration.  Dr. Benay Spice will see the patient this afternoon.  Final Clinical Impressions(s) / ED Diagnoses   Final diagnoses:  Dehydration  Chemotherapy-induced nausea  Hypomagnesemia  Hypokalemia    ED Discharge Orders    None       Duffy Bruce, MD 09/26/17 1205

## 2017-09-26 NOTE — ED Notes (Signed)
ED Provider at bedside. 

## 2017-09-27 DIAGNOSIS — I10 Essential (primary) hypertension: Secondary | ICD-10-CM | POA: Diagnosis not present

## 2017-09-27 DIAGNOSIS — R109 Unspecified abdominal pain: Secondary | ICD-10-CM | POA: Diagnosis not present

## 2017-09-27 DIAGNOSIS — Z87891 Personal history of nicotine dependence: Secondary | ICD-10-CM | POA: Diagnosis not present

## 2017-09-27 DIAGNOSIS — R197 Diarrhea, unspecified: Secondary | ICD-10-CM | POA: Diagnosis not present

## 2017-09-27 DIAGNOSIS — Z7982 Long term (current) use of aspirin: Secondary | ICD-10-CM | POA: Diagnosis not present

## 2017-09-27 DIAGNOSIS — Z8673 Personal history of transient ischemic attack (TIA), and cerebral infarction without residual deficits: Secondary | ICD-10-CM | POA: Diagnosis not present

## 2017-09-27 DIAGNOSIS — E876 Hypokalemia: Secondary | ICD-10-CM | POA: Diagnosis not present

## 2017-09-27 DIAGNOSIS — C641 Malignant neoplasm of right kidney, except renal pelvis: Secondary | ICD-10-CM | POA: Diagnosis not present

## 2017-09-27 DIAGNOSIS — K50018 Crohn's disease of small intestine with other complication: Secondary | ICD-10-CM | POA: Diagnosis not present

## 2017-09-27 DIAGNOSIS — R112 Nausea with vomiting, unspecified: Secondary | ICD-10-CM | POA: Diagnosis not present

## 2017-09-27 DIAGNOSIS — R110 Nausea: Secondary | ICD-10-CM | POA: Diagnosis not present

## 2017-09-27 DIAGNOSIS — D61810 Antineoplastic chemotherapy induced pancytopenia: Secondary | ICD-10-CM | POA: Diagnosis not present

## 2017-09-27 DIAGNOSIS — E785 Hyperlipidemia, unspecified: Secondary | ICD-10-CM | POA: Diagnosis present

## 2017-09-27 DIAGNOSIS — E871 Hypo-osmolality and hyponatremia: Secondary | ICD-10-CM

## 2017-09-27 DIAGNOSIS — E440 Moderate protein-calorie malnutrition: Secondary | ICD-10-CM | POA: Diagnosis not present

## 2017-09-27 DIAGNOSIS — C250 Malignant neoplasm of head of pancreas: Secondary | ICD-10-CM | POA: Diagnosis not present

## 2017-09-27 DIAGNOSIS — D6959 Other secondary thrombocytopenia: Secondary | ICD-10-CM | POA: Diagnosis present

## 2017-09-27 DIAGNOSIS — E861 Hypovolemia: Secondary | ICD-10-CM | POA: Diagnosis present

## 2017-09-27 DIAGNOSIS — D6481 Anemia due to antineoplastic chemotherapy: Secondary | ICD-10-CM | POA: Diagnosis not present

## 2017-09-27 DIAGNOSIS — R627 Adult failure to thrive: Secondary | ICD-10-CM | POA: Diagnosis present

## 2017-09-27 DIAGNOSIS — I2510 Atherosclerotic heart disease of native coronary artery without angina pectoris: Secondary | ICD-10-CM | POA: Diagnosis not present

## 2017-09-27 DIAGNOSIS — K521 Toxic gastroenteritis and colitis: Secondary | ICD-10-CM | POA: Diagnosis not present

## 2017-09-27 DIAGNOSIS — R933 Abnormal findings on diagnostic imaging of other parts of digestive tract: Secondary | ICD-10-CM | POA: Diagnosis not present

## 2017-09-27 DIAGNOSIS — I350 Nonrheumatic aortic (valve) stenosis: Secondary | ICD-10-CM | POA: Diagnosis not present

## 2017-09-27 DIAGNOSIS — R140 Abdominal distension (gaseous): Secondary | ICD-10-CM | POA: Diagnosis not present

## 2017-09-27 DIAGNOSIS — E119 Type 2 diabetes mellitus without complications: Secondary | ICD-10-CM | POA: Diagnosis not present

## 2017-09-27 DIAGNOSIS — I252 Old myocardial infarction: Secondary | ICD-10-CM | POA: Diagnosis not present

## 2017-09-27 DIAGNOSIS — A09 Infectious gastroenteritis and colitis, unspecified: Secondary | ICD-10-CM | POA: Diagnosis present

## 2017-09-27 DIAGNOSIS — T451X5A Adverse effect of antineoplastic and immunosuppressive drugs, initial encounter: Secondary | ICD-10-CM | POA: Diagnosis not present

## 2017-09-27 DIAGNOSIS — E860 Dehydration: Secondary | ICD-10-CM | POA: Diagnosis not present

## 2017-09-27 DIAGNOSIS — R1084 Generalized abdominal pain: Secondary | ICD-10-CM | POA: Diagnosis not present

## 2017-09-27 DIAGNOSIS — K219 Gastro-esophageal reflux disease without esophagitis: Secondary | ICD-10-CM | POA: Diagnosis present

## 2017-09-27 DIAGNOSIS — Z955 Presence of coronary angioplasty implant and graft: Secondary | ICD-10-CM | POA: Diagnosis not present

## 2017-09-27 LAB — CBC WITH DIFFERENTIAL/PLATELET
Basophils Absolute: 0 10*3/uL (ref 0.0–0.1)
Basophils Relative: 0 %
Eosinophils Absolute: 0 10*3/uL (ref 0.0–0.7)
Eosinophils Relative: 1 %
HCT: 29.8 % — ABNORMAL LOW (ref 39.0–52.0)
Hemoglobin: 10.1 g/dL — ABNORMAL LOW (ref 13.0–17.0)
Lymphocytes Relative: 22 %
Lymphs Abs: 0.7 10*3/uL (ref 0.7–4.0)
MCH: 30 pg (ref 26.0–34.0)
MCHC: 33.9 g/dL (ref 30.0–36.0)
MCV: 88.4 fL (ref 78.0–100.0)
Monocytes Absolute: 0.9 10*3/uL (ref 0.1–1.0)
Monocytes Relative: 25 %
Neutro Abs: 1.8 10*3/uL (ref 1.7–7.7)
Neutrophils Relative %: 52 %
Platelets: 103 10*3/uL — ABNORMAL LOW (ref 150–400)
RBC: 3.37 MIL/uL — ABNORMAL LOW (ref 4.22–5.81)
RDW: 13.5 % (ref 11.5–15.5)
WBC: 3.4 10*3/uL — ABNORMAL LOW (ref 4.0–10.5)

## 2017-09-27 LAB — BASIC METABOLIC PANEL
Anion gap: 6 (ref 5–15)
BUN: 18 mg/dL (ref 6–20)
CO2: 20 mmol/L — ABNORMAL LOW (ref 22–32)
Calcium: 7.8 mg/dL — ABNORMAL LOW (ref 8.9–10.3)
Chloride: 102 mmol/L (ref 101–111)
Creatinine, Ser: 0.95 mg/dL (ref 0.61–1.24)
GFR calc Af Amer: >60 mL/min (ref >60)
GFR calc non Af Amer: >60 mL/min (ref >60)
Glucose, Bld: 127 mg/dL — ABNORMAL HIGH (ref 65–99)
Potassium: 3.7 mmol/L (ref 3.5–5.1)
Sodium: 128 mmol/L — ABNORMAL LOW (ref 135–145)

## 2017-09-27 LAB — C DIFFICILE QUICK SCREEN W PCR REFLEX
C Diff antigen: NEGATIVE
C Diff interpretation: NOT DETECTED
C Diff toxin: NEGATIVE

## 2017-09-27 MED ORDER — RESOURCE INSTANT PROTEIN PO PWD PACKET
1.0000 | Freq: Three times a day (TID) | ORAL | Status: DC
  Administered 2017-09-27 – 2017-09-30 (×3): 6 g via ORAL
  Filled 2017-09-27 (×18): qty 6

## 2017-09-27 MED ORDER — POTASSIUM CHLORIDE IN NACL 20-0.9 MEQ/L-% IV SOLN
INTRAVENOUS | Status: AC
  Administered 2017-09-27: 14:00:00 via INTRAVENOUS
  Filled 2017-09-27 (×2): qty 1000

## 2017-09-27 MED ORDER — SODIUM CHLORIDE 0.9 % IV BOLUS (SEPSIS)
1000.0000 mL | Freq: Once | INTRAVENOUS | Status: AC
  Administered 2017-09-27: 1000 mL via INTRAVENOUS

## 2017-09-27 MED ORDER — BENEPROTEIN PO POWD
1.0000 | Freq: Three times a day (TID) | ORAL | Status: DC
  Filled 2017-09-27: qty 227

## 2017-09-27 MED ORDER — UNJURY CHICKEN SOUP POWDER
8.0000 [oz_av] | ORAL | Status: DC
  Administered 2017-09-27: 8 [oz_av] via ORAL
  Filled 2017-09-27 (×6): qty 27

## 2017-09-27 NOTE — Evaluation (Signed)
Physical Therapy Evaluation Patient Details Name: DEMETRIS CAPELL MRN: [id number] DOB: [birth year] Today's Date: 09/27/2017   History of Present Illness  72 yo male admitted with dehydration, chemo induced enteritis. hx of pancreatic cancer, DM, aortic valve replacement  Clinical Impression  Bed level eval only. Pt too drowsy/fatigued. He declined OOB activity on today. He agreed to try another day. Wife present during session. She seemed to think he wasn't in any condition to try to get up today. Will continue to follow and progress activity as able.     Follow Up Recommendations Home health PT;Supervision/Assistance - 24 hour(depending on progress. Will continue to assess. )    Equipment Recommendations  (continuing to assess)    Recommendations for Other Services       Precautions / Restrictions Precautions Precautions: Fall Precaution Comments: enteric, droplet Restrictions Weight Bearing Restrictions: No      Mobility  Bed Mobility               General bed mobility comments: NT-pt declined OOB activity on today. Recently medicated and very drowsy.   Transfers                    Ambulation/Gait                Stairs            Wheelchair Mobility    Modified Rankin (Stroke Patients Only)       Balance                                             Pertinent Vitals/Pain Pain Assessment: No/denies pain    Home Living Family/patient expects to be discharged to:: Private residence Living Arrangements: Spouse/significant other Available Help at Discharge: Family Type of Home: House Home Access: Stairs to enter   Technical brewer of Steps: 1+1 Home Layout: One level Home Equipment: None      Prior Function Level of Independence: Independent               Hand Dominance        Extremity/Trunk Assessment   Upper Extremity Assessment Upper Extremity Assessment: Overall WFL for tasks  assessed    Lower Extremity Assessment Lower Extremity Assessment: Generalized weakness    Cervical / Trunk Assessment Cervical / Trunk Assessment: Normal  Communication   Communication: No difficulties  Cognition Arousal/Alertness: Suspect due to medications(drowsy) Behavior During Therapy: WFL for tasks assessed/performed Overall Cognitive Status: Within Functional Limits for tasks assessed                                        General Comments      Exercises     Assessment/Plan    PT Assessment Patient needs continued PT services  PT Problem List Decreased strength;Decreased balance;Decreased mobility;Decreased activity tolerance;Decreased knowledge of use of DME       PT Treatment Interventions DME instruction;Gait training;Functional mobility training;Therapeutic activities;Balance training;Patient/family education;Therapeutic exercise    PT Goals (Current goals can be found in the Care Plan section)  Acute Rehab PT Goals Patient Stated Goal: to get better, stronger (per pt and wife) PT Goal Formulation: With patient Time For Goal Achievement: 10/11/17 Potential to Achieve Goals: Good    Frequency  Barriers to discharge        Co-evaluation               AM-PAC PT "6 Clicks" Daily Activity  Outcome Measure Difficulty turning over in bed (including adjusting bedclothes, sheets and blankets)?: Unable Difficulty moving from lying on back to sitting on the side of the bed? : Unable Difficulty sitting down on and standing up from a chair with arms (e.g., wheelchair, bedside commode, etc,.)?: Unable Help needed moving to and from a bed to chair (including a wheelchair)?: A Lot Help needed walking in hospital room?: A Lot Help needed climbing 3-5 steps with a railing? : A Lot 6 Click Score: 9    End of Session   Activity Tolerance: Patient limited by fatigue Patient left: in bed;with call bell/phone within reach;with bed alarm  set;with family/visitor present   PT Visit Diagnosis: Muscle weakness (generalized) (M62.81);Difficulty in walking, not elsewhere classified (R26.2)    Time: 0258-5277 PT Time Calculation (min) (ACUTE ONLY): 8 min   Charges:   PT Evaluation $PT Eval Moderate Complexity: 1 Mod     PT G Codes:          Weston Anna, MPT Pager: [phone]

## 2017-09-27 NOTE — Progress Notes (Signed)
[facility] PROGRESS NOTE    Progress Note  Manuel Olson  BEM:[id number] DOB: 1945-10-02 DOA: 09/26/2017 PCP: Satira Sark, MD     Brief Narrative:   Manuel Olson is an 72 y.o. male past medical history significant for pancreatic cancer on chemotherapy, status post aortic valve replacement comes in for 1 week of nausea and vomiting not eating well he has seen his oncologist several times has received IV fluids and is felt better but comes in today with intractable nausea and vomiting.  Assessment/Plan:   Dehydration: Resume IV fluids, monitor strict I's and O's.  Hyponatremia: Likely due to hypovolemia we will give him a bolus of normal saline and continue IV fluids recheck a basic metabolic panel in the morning. He did have an echo that showed an ejection fraction of 45% status post TAVRS.  Monitor strict I's and O's.  New onset of fevers: Check a virus panel, he is having ongoing diarrhea with mild leukocytosis, being recently in the hospital and abdominal pain we will check a C. difficile PCR.  Severe aortic stenosis/S/P TAVR (transcatheter aortic valve replacement) Stable.  Cancer of head of pancreas [facility]) Oncology following recommended to hold chemotherapy for now appreciate assistance.  Essential hypertension, benign Hold antihypertensive medication.  CAD (coronary artery disease) Stable continue Lipitor,aspirin and Plavix.    DVT prophylaxis: lovenox Family Communication:wife Disposition Plan/Barrier to D/C: once able to tolerate orals. Code Status:     Code Status Orders  (From admission, onward)        Start     Ordered   09/26/17 1752  Full code  Continuous     09/26/17 1751    Code Status History    Date Active Date Inactive Code Status Order ID Comments User Context   08/22/2017 18:05 08/24/2017 13:51 Full Code [id number]  Gaye Pollack, MD Inpatient   07/05/2017 09:25 07/05/2017 15:34 Full Code [id number]  [name], MD Inpatient    Advance Directive Documentation     Most Recent Value  Type of Advance Directive  Healthcare Power of Attorney, Living will  Pre-existing out of facility DNR order (yellow form or pink MOST form)  No data  "MOST" Form in Place?  No data        IV Access:    Peripheral IV   Procedures and diagnostic studies:   No results found.   Medical Consultants:    None.  Anti-Infectives:   None  Subjective:    Manuel Olson place he cannot tolerate anything oral daily, he feels worse than yesterday.  Objective:    Vitals:   09/26/17 1656 09/26/17 1854 09/26/17 2118 09/27/17 0548  BP: 136/68 (!) 147/64 (!) 113/53 (!) 133/54  Pulse: 73 91 81 87  Resp: 20 20 19 18   Temp:  98.2 F (36.8 C) 100.2 F (37.9 C) (!) 100.7 F (38.2 C)  TempSrc:  Oral Oral Oral  SpO2: 96% 98% 96% 97%  Weight:    78.8 kg (173 lb 11.6 oz)  Height:        Intake/Output Summary (Last 24 hours) at 09/27/2017 0917 Last data filed at 09/26/2017 1604 Gross per 24 hour  Intake 1100 ml  Output -  Net 1100 ml   Filed Weights   09/26/17 0940 09/27/17 0548  Weight: 73 kg (161 lb) 78.8 kg (173 lb 11.6 oz)    Exam: General exam: In no acute distress. Respiratory system: Good air movement and clear to auscultation. Cardiovascular system: S1 &  S2 heard, RRR. No JVD. Gastrointestinal system: Abdomen is nondistended, soft and nontender.  Central nervous system: Alert and oriented. No focal neurological deficits. Extremities: No pedal edema. Skin: No rashes, lesions or ulcers Psychiatry: Judgement and insight appear normal. Mood & affect appropriate.    Data Reviewed:    Labs: Basic Metabolic Panel: Recent Labs  Lab 09/23/17 1401 09/25/17 1301 09/26/17 0943 09/27/17 0409  NA 127* 130* 129* 128*  K 3.9 3.7 3.3* 3.7  CL 96* 101 99* 102  CO2 22 21* 21* 20*  GLUCOSE 322* 208* 161* 127*  BUN 42* 29* 24* 18  CREATININE 1.54*  --  1.05 0.95  CALCIUM 8.5* 8.5  8.1* 7.8*  MG  --   --  1.5*  --    GFR Estimated Creatinine Clearance: 71.5 mL/min (by C-G formula based on SCr of 0.95 mg/dL). Liver Function Tests: Recent Labs  Lab 09/23/17 1401 09/25/17 1301 09/26/17 0943  AST 13* 12 16  ALT 11* 26 21  ALKPHOS 119 87 65  BILITOT 1.0 0.3 0.3  PROT 5.9* 5.5* 5.1*  ALBUMIN 3.3* 2.8* 2.6*   Recent Labs  Lab 09/25/17 1301 09/26/17 0943  LIPASE <10* <10*  AMYLASE 15*  --    No results for input(s): AMMONIA in the last 168 hours. Coagulation profile No results for input(s): INR, PROTIME in the last 168 hours.  CBC: Recent Labs  Lab 09/23/17 1401 09/25/17 1301 09/26/17 0943 09/27/17 0409  WBC 14.9*  --  2.9* 3.4*  NEUTROABS 13.2* 1.7 1.2* 1.8  HGB 12.9*  --  10.3* 10.1*  HCT 37.6* 34.4* 30.1* 29.8*  MCV 89.1 90.2 88.8 88.4  PLT 78*  --  86* 103*   Cardiac Enzymes: No results for input(s): CKTOTAL, CKMB, CKMBINDEX, TROPONINI in the last 168 hours. BNP (last 3 results) No results for input(s): PROBNP in the last 8760 hours. CBG: No results for input(s): GLUCAP in the last 168 hours. D-Dimer: No results for input(s): DDIMER in the last 72 hours. Hgb A1c: No results for input(s): HGBA1C in the last 72 hours. Lipid Profile: No results for input(s): CHOL, HDL, LDLCALC, TRIG, CHOLHDL, LDLDIRECT in the last 72 hours. Thyroid function studies: No results for input(s): TSH, T4TOTAL, T3FREE, THYROIDAB in the last 72 hours.  Invalid input(s): FREET3 Anemia work up: No results for input(s): VITAMINB12, FOLATE, FERRITIN, TIBC, IRON, RETICCTPCT in the last 72 hours. Sepsis Labs: Recent Labs  Lab 09/23/17 1401 09/26/17 0943 09/27/17 0409  WBC 14.9* 2.9* 3.4*   Microbiology No results found for this or any previous visit (from the past 240 hour(s)).   Medications:   . aspirin EC  81 mg Oral Daily  . atorvastatin  20 mg Oral Daily  . clopidogrel  75 mg Oral Daily  . lisinopril  10 mg Oral Daily  . magnesium oxide  400 mg Oral  BID   Continuous Infusions: . 0.9 % NaCl with KCl 20 mEq / L       LOS: 0 days   [facility] Pager [phone]  *Please refer to [email or web address], password [email or web address] to get updated schedule on who will round on this patient, as hospitalists switch teams weekly. If 7PM-7AM, please contact night-coverage at [email or web address], password [email or web address] for any overnight needs.  09/27/2017, 9:17 AM

## 2017-09-27 NOTE — Progress Notes (Addendum)
IP PROGRESS NOTE  Subjective:   Mr. Steil reports the diarrhea has slowed.  He continues to have abdominal pain.  Objective: Vital signs in last 24 hours: Blood pressure (!) 133/54, pulse 87, temperature (!) 100.7 F (38.2 C), temperature source Oral, resp. rate 18, height 5' 10"  (1.778 m), weight 173 lb 11.6 oz (78.8 kg), SpO2 97 %.  Intake/Output from previous day: 01/15 0701 - 01/16 0700 In: 1100 [IV Piggyback:1100] Out: -   Physical Exam:  HEENT: No thrush or ulcers  Lungs: Clear bilaterally, no respiratory distress Cardiac: Regular rate and rhythm Abdomen: Soft, mild diffuse tenderness, bowel sounds are present Extremities: No leg edema Neurologic: Alert and oriented  Portacath/PICC-without erythema  Lab Results: Recent Labs    09/26/17 0943 09/27/17 0409  WBC 2.9* 3.4*  HGB 10.3* 10.1*  HCT 30.1* 29.8*  PLT 86* 103*    BMET Recent Labs    09/26/17 0943 09/27/17 0409  NA 129* 128*  K 3.3* 3.7  CL 99* 102  CO2 21* 20*  GLUCOSE 161* 127*  BUN 24* 18  CREATININE 1.05 0.95  CALCIUM 8.1* 7.8*    No results found for: CEA1  Studies/Results: No results found.  Medications: I have reviewed the patient's current medications.  Assessment/Plan: 1. Adenocarcinoma of the pancreas head/neck, status post an EUS biopsy 08/02/2017 confirming adenocarcinoma ? EUS 08/02/2017-pancreas head/neck mass, abutment of the portal vein, no peripancreatic adenopathy ? MRI abdomen 07/27/2017- head/neck pancreas mass with mass-effect on the portal splenic confluence, no involvement of the celiac axis or superior mesenteric artery, no evidence of metastatic disease ? PET scan 08/31/2017-very hypermetabolism at the pancreatic head mass, no evidence of metastatic disease. The isolated right lower lobe nodule below PET resolution, left sided renal mass with decreased activity relative to the normal adjacent kidney ? Cycle 1 FOLFIRINOX 09/16/2017  2. 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   Port-A-Cath placement 09/15/2017, Dr. Barry Dienes  9.   Nausea and diarrhea secondary to chemotherapy enteritis  10.  Dehydration  11.  Pancytopenia secondary to chemotherapy  Mr. Whitworth appears to have chemotherapy-induced enteritis.  The abdominal pain is likely related to enteritis as opposed to pancreas cancer or another abdominal process.  The white count and platelets are stable today.  Hopefully the diarrhea will improve over the next few days.  Check stool C. difficile toxin.  He has a low-grade fever this morning.  This is likely secondary to the chemotherapy enteritis.  Recommendations: 1.   Continue intravenous hydration with potassium supplementation 2.   Stool for C. difficile toxin    LOS: 0 days   [name], MD   09/27/2017, 9:12 AM

## 2017-09-27 NOTE — Progress Notes (Signed)
Initial Nutrition Assessment  DOCUMENTATION CODES:   Non-severe (moderate) malnutrition in context of acute illness/injury  INTERVENTION:   -Provide Unjury chicken Soup daily, Each serving provides 100kcal and 21g protein  -Provide Beneprotein powder TID, each provides 25 kcal and 6g protein.  RD will continue to monitor  NUTRITION DIAGNOSIS:   Moderate Malnutrition related to cancer and cancer related treatments, acute illness as evidenced by percent weight loss, energy intake < 75% for > 7 days, moderate muscle depletion.  GOAL:   Patient will meet greater than or equal to 90% of their needs  MONITOR:   PO intake, Supplement acceptance, Weight trends, Labs, I & O's  REASON FOR ASSESSMENT:   Consult Assessment of nutrition requirement/status  ASSESSMENT:   72 y.o. male past medical history significant for pancreatic cancer on chemotherapy, status post aortic valve replacement comes in for 1 week of nausea and vomiting not eating well he has seen his oncologist several times has received IV fluids and is felt better but comes in today with intractable nausea and vomiting.  Pt in room sleeping with wife at bedside. Pt's wife provided most of history. Pt is followed by [place] RD, last seen 1/7. At that time, RD reported no nutrition related symptoms following chemotherapy treatment on 1/5. Pt's wife states that pt developed N/V/D on 1/9. He has tried to eat but cannot tolerate much and doesn't feel like eating most of the time. Pt drank a total of 3 Ensure supplements over the past week. Last consumed anything on Monday night, 1/2 bottle of Ensure. Pt with no nausea today, last BM was at 0330 this morning, loose.   Per weight records, pt weighed 161 lb in the ED when he initially came to the hospital on 1/14. Per oncology note, pt recevied IVF and was sent back home, only to have pt return on 1/15. Suspect current weight of 173 lb is d/t fluid.  Pt has lost 9 lb since 1/4  (5% wt loss x 12 days, significant for time frame).   Medications: MAG-OX tablet BID Labs reviewed: Low Na, Mg   NUTRITION - FOCUSED PHYSICAL EXAM:    Most Recent Value  Orbital Region  No depletion  Upper Arm Region  No depletion  Thoracic and Lumbar Region  Unable to assess  Buccal Region  No depletion  Temple Region  Moderate depletion  Clavicle Bone Region  No depletion  Clavicle and Acromion Bone Region  No depletion  Scapular Bone Region  Unable to assess  Dorsal Hand  No depletion  Patellar Region  Unable to assess  Anterior Thigh Region  Unable to assess  Posterior Calf Region  Unable to assess  Edema (RD Assessment)  None       Diet Order:  Diet Heart Room service appropriate? Yes; Fluid consistency: Thin  EDUCATION NEEDS:   Education needs have been addressed  Skin:  Skin Assessment: Skin Integrity Issues: Skin Integrity Issues:: Incisions Incisions: left chest  Last BM:  1/15  Height:   Ht Readings from Last 1 Encounters:  09/26/17 5' 10"  (1.778 m)    Weight:   Wt Readings from Last 1 Encounters:  09/27/17 173 lb 11.6 oz (78.8 kg)    Ideal Body Weight:  75.5 kg  BMI:  Body mass index is 24.93 kg/m.  Estimated Nutritional Needs:   Kcal:  2150-2350  Protein:  105-115g  Fluid:  2.1L/day  Clayton Bibles, MS, RD, LDN [facility] Clinical Dietitian Pager: [phone] After Hours  Pager: [phone]

## 2017-09-28 ENCOUNTER — Inpatient Hospital Stay (HOSPITAL_COMMUNITY): Payer: Medicare Other

## 2017-09-28 ENCOUNTER — Encounter (HOSPITAL_COMMUNITY): Payer: Self-pay | Admitting: Radiology

## 2017-09-28 DIAGNOSIS — T451X5A Adverse effect of antineoplastic and immunosuppressive drugs, initial encounter: Secondary | ICD-10-CM

## 2017-09-28 DIAGNOSIS — K5000 Crohn's disease of small intestine without complications: Secondary | ICD-10-CM

## 2017-09-28 DIAGNOSIS — E440 Moderate protein-calorie malnutrition: Secondary | ICD-10-CM

## 2017-09-28 DIAGNOSIS — R1084 Generalized abdominal pain: Secondary | ICD-10-CM

## 2017-09-28 DIAGNOSIS — D6481 Anemia due to antineoplastic chemotherapy: Secondary | ICD-10-CM

## 2017-09-28 LAB — RESPIRATORY PANEL BY PCR

## 2017-09-28 LAB — COMPREHENSIVE METABOLIC PANEL
ALT: 16 U/L — ABNORMAL LOW (ref 17–63)
AST: 11 U/L — ABNORMAL LOW (ref 15–41)
Albumin: 2.1 g/dL — ABNORMAL LOW (ref 3.5–5.0)
Alkaline Phosphatase: 58 U/L (ref 38–126)
Anion gap: 8 (ref 5–15)
BUN: 15 mg/dL (ref 6–20)
CO2: 20 mmol/L — ABNORMAL LOW (ref 22–32)
Calcium: 7.9 mg/dL — ABNORMAL LOW (ref 8.9–10.3)
Chloride: 102 mmol/L (ref 101–111)
Creatinine, Ser: 0.94 mg/dL (ref 0.61–1.24)
GFR calc Af Amer: >60 mL/min (ref >60)
GFR calc non Af Amer: >60 mL/min (ref >60)
Glucose, Bld: 124 mg/dL — ABNORMAL HIGH (ref 65–99)
Potassium: 3.3 mmol/L — ABNORMAL LOW (ref 3.5–5.1)
Sodium: 130 mmol/L — ABNORMAL LOW (ref 135–145)
Total Bilirubin: 1 mg/dL (ref 0.3–1.2)
Total Protein: 4.5 g/dL — ABNORMAL LOW (ref 6.5–8.1)

## 2017-09-28 LAB — MAGNESIUM: Magnesium: 1.7 mg/dL (ref 1.7–2.4)

## 2017-09-28 MED ORDER — POTASSIUM CHLORIDE IN NACL 40-0.9 MEQ/L-% IV SOLN
INTRAVENOUS | Status: AC
  Administered 2017-09-28 (×2): 75 mL/h via INTRAVENOUS
  Filled 2017-09-28 (×2): qty 1000

## 2017-09-28 MED ORDER — CIPROFLOXACIN IN D5W 400 MG/200ML IV SOLN
400.0000 mg | Freq: Two times a day (BID) | INTRAVENOUS | Status: DC
  Administered 2017-09-28 – 2017-10-02 (×8): 400 mg via INTRAVENOUS
  Filled 2017-09-28 (×8): qty 200

## 2017-09-28 MED ORDER — ONDANSETRON HCL 4 MG/2ML IJ SOLN
4.0000 mg | Freq: Four times a day (QID) | INTRAMUSCULAR | Status: DC | PRN
  Administered 2017-09-28 (×2): 4 mg via INTRAVENOUS
  Filled 2017-09-28 (×2): qty 2

## 2017-09-28 MED ORDER — SODIUM CHLORIDE 0.9 % IV SOLN: INTRAVENOUS | Status: DC

## 2017-09-28 MED ORDER — IOPAMIDOL (ISOVUE-300) INJECTION 61%
INTRAVENOUS | Status: AC
  Filled 2017-09-28: qty 30

## 2017-09-28 MED ORDER — IOPAMIDOL (ISOVUE-300) INJECTION 61%
INTRAVENOUS | Status: AC
  Filled 2017-09-28: qty 100

## 2017-09-28 MED ORDER — METRONIDAZOLE IN NACL 5-0.79 MG/ML-% IV SOLN
500.0000 mg | Freq: Three times a day (TID) | INTRAVENOUS | Status: DC
  Administered 2017-09-28 – 2017-10-02 (×11): 500 mg via INTRAVENOUS
  Filled 2017-09-28 (×13): qty 100

## 2017-09-28 MED ORDER — MORPHINE SULFATE (PF) 4 MG/ML IV SOLN
4.0000 mg | INTRAVENOUS | Status: DC | PRN
  Administered 2017-09-28 – 2017-09-30 (×5): 4 mg via INTRAVENOUS
  Filled 2017-09-28 (×6): qty 1

## 2017-09-28 MED ORDER — IOPAMIDOL (ISOVUE-300) INJECTION 61%
100.0000 mL | Freq: Once | INTRAVENOUS | Status: AC | PRN
  Administered 2017-09-28: 100 mL via INTRAVENOUS

## 2017-09-28 MED ORDER — IOPAMIDOL (ISOVUE-300) INJECTION 61%
30.0000 mL | Freq: Once | INTRAVENOUS | Status: AC
  Administered 2017-09-28: 30 mL via ORAL

## 2017-09-28 NOTE — Progress Notes (Signed)
Physical Therapy Treatment Patient Details Name: Manuel Olson MRN: [id number] DOB: Sep 02, 1946 Today's Date: 09/28/2017    History of Present Illness 72 yo male admitted with dehydration, chemo induced enteritis. hx of pancreatic cancer, DM, aortic valve replacement    PT Comments    Pt nauseous and vomiting just prior to PT arrival. He requested to walk into bathroom. Min assist for stability while ambulating. He is not really agreeable to using a walker at this time. Pt is in process of drinking prep for CT scan. Will continue to follow and progress activity as tolerated.     Follow Up Recommendations  Home health PT;Supervision/Assistance - 24 hour     Equipment Recommendations  (continuing to assess-pt not yet agreeable to using walker)    Recommendations for Other Services       Precautions / Restrictions Precautions Precautions: Fall Precaution Comments:  droplet Restrictions Weight Bearing Restrictions: No    Mobility  Bed Mobility Overal bed mobility: Needs Assistance Bed Mobility: Sit to Sidelying         Sit to sidelying: Min guard General bed mobility comments: Increased time. Some effort noted with getting LEs onto bed.   Transfers Overall transfer level: Needs assistance Equipment used: Rolling walker (2 wheeled) Transfers: Sit to/from Stand Sit to Stand: Min guard         General transfer comment: close guard for safety.   Ambulation/Gait   Ambulation Distance (Feet): 15 Feet(x2) Assistive device: Rolling walker (2 wheeled);1 person hand held assist Gait Pattern/deviations: Step-through pattern;Decreased stride length   Min Assist General Gait Details: Unsteady. Improved stability with RW use however pt is not really agreeable to using RW. He pushed it away before walking back to bed.    Stairs            Wheelchair Mobility    Modified Rankin (Stroke Patients Only)       Balance Overall balance assessment: Needs  assistance           Standing balance-Leahy Scale: Fair                              Cognition Arousal/Alertness: Awake/alert Behavior During Therapy: WFL for tasks assessed/performed Overall Cognitive Status: Within Functional Limits for tasks assessed                                        Exercises      General Comments        Pertinent Vitals/Pain Pain Assessment: Faces Faces Pain Scale: Hurts little more Pain Location: abdomen Pain Descriptors / Indicators: Sore    Home Living                      Prior Function            PT Goals (current goals can now be found in the care plan section) Progress towards PT goals: Progressing toward goals    Frequency    Min 3X/week      PT Plan Current plan remains appropriate    Co-evaluation              AM-PAC PT "6 Clicks" Daily Activity  Outcome Measure  Difficulty turning over in bed (including adjusting bedclothes, sheets and blankets)?: A Little Difficulty moving from lying on back to sitting on the side  of the bed? : A Little Difficulty sitting down on and standing up from a chair with arms (e.g., wheelchair, bedside commode, etc,.)?: A Little Help needed moving to and from a bed to chair (including a wheelchair)?: A Little Help needed walking in hospital room?: A Little Help needed climbing 3-5 steps with a railing? : A Lot 6 Click Score: 17    End of Session   Activity Tolerance: Patient limited by fatigue Patient left: in bed;with call bell/phone within reach;with family/visitor present;with bed alarm set   PT Visit Diagnosis: Muscle weakness (generalized) (M62.81);Difficulty in walking, not elsewhere classified (R26.2)     Time: 3244-0102 PT Time Calculation (min) (ACUTE ONLY): 16 min  Charges:  $Gait Training: 8-22 mins                    G Codes:          Weston Anna, MPT Pager: [phone]

## 2017-09-28 NOTE — Progress Notes (Signed)
[facility] PROGRESS NOTE    Progress Note  Manuel Olson  KWI:[id number] DOB: [birth year] DOA: 09/26/2017 PCP: Renee Rival, NP     Brief Narrative:   Manuel Olson is an 72 y.o. male past medical history significant for pancreatic cancer on chemotherapy, status post aortic valve replacement comes in for 1 week of nausea and vomiting not eating well he has seen his oncologist several times has received IV fluids and is felt better but comes in today with intractable nausea and vomiting.  Assessment/Plan:   Dehydration: Cont. IV fluid hydration he has had poor oral intake.  Hyponatremia: Likely due to hypovolemia, his hyponatremia is improving.  We will continue additional IV fluids recheck basic metabolic panel in the morning. He needs to have poor oral intake.  New onset of fevers/abdominal pain: Virus panel is pending, he is now afebrile, C. difficile PCR is negative. CT scan of the abdomen and pelvis he relates he is having new abdominal pain.  Has no peritoneal signs.  Severe aortic stenosis/S/P TAVR (transcatheter aortic valve replacement) Stable.  Cancer of head of pancreas [facility]) Oncology following recommended to hold chemotherapy for now appreciate assistance.  Essential hypertension, benign Hold antihypertensive medication.  CAD (coronary artery disease) Stable continue Lipitor,aspirin and Plavix.  Thrombocytopenia: Is improving slowly.  [place] due to chemotherapy.  Antineoplastic induced anemia: Remained stable continue to monitor.   DVT prophylaxis: lovenox Family Communication:wife Disposition Plan/Barrier to D/C: once able to tolerate orals. Code Status:     Code Status Orders  (From admission, onward)        Start     Ordered   09/26/17 1752  Full code  Continuous     09/26/17 1751    Code Status History    Date Active Date Inactive Code Status Order ID Comments User Context   08/22/2017 18:05 08/24/2017 13:51 Full  Code [id number]  Gaye Pollack, MD Inpatient   07/05/2017 09:25 07/05/2017 15:34 Full Code [id number]  [name], MD Inpatient    Advance Directive Documentation     Most Recent Value  Type of Advance Directive  Healthcare Power of Attorney, Living will  Pre-existing out of facility DNR order (yellow form or pink MOST form)  No data  "MOST" Form in Place?  No data        IV Access:    Peripheral IV   Procedures and diagnostic studies:   No results found.   Medical Consultants:    None.  Anti-Infectives:   None  Subjective:    Manuel Olson you abdominal pain that got worse this morning, continues to have poor oral intake.  Objective:    Vitals:   09/27/17 0548 09/27/17 1255 09/27/17 2300 09/28/17 0453  BP: (!) 133/54 (!) 118/52 (!) 120/51 (!) 141/58  Pulse: 87 81 86 89  Resp: 18 18 20 20   Temp: (!) 100.7 F (38.2 C) 98.4 F (36.9 C) 99.9 F (37.7 C) 99.4 F (37.4 C)  TempSrc: Oral Oral Oral Oral  SpO2: 97% 96% 96% 95%  Weight: 78.8 kg (173 lb 11.6 oz)     Height:        Intake/Output Summary (Last 24 hours) at 09/28/2017 0800 Last data filed at 09/27/2017 1256 Gross per 24 hour  Intake 120 ml  Output -  Net 120 ml   Filed Weights   09/26/17 0940 09/27/17 0548  Weight: 73 kg (161 lb) 78.8 kg (173 lb 11.6 oz)    Exam: General exam:  In no acute distress. Respiratory system: Good air movement and clear to auscultation. Cardiovascular system: S1 & S2 heard, RRR. No JVD. Gastrointestinal system: Active bowel sounds, soft diffuse tenderness no rebound or guarding. Central nervous system: Alert and oriented. No focal neurological deficits. Extremities: No pedal edema. Skin: No rashes, lesions or ulcers Psychiatry: Judgement and insight appear normal. Mood & affect appropriate.    Data Reviewed:    Labs: Basic Metabolic Panel: Recent Labs  Lab 09/23/17 1401 09/25/17 1301 09/26/17 0943 09/27/17 0409 09/28/17 0500  NA 127*  130* 129* 128* 130*  K 3.9 3.7 3.3* 3.7 3.3*  CL 96* 101 99* 102 102  CO2 22 21* 21* 20* 20*  GLUCOSE 322* 208* 161* 127* 124*  BUN 42* 29* 24* 18 15  CREATININE 1.54*  --  1.05 0.95 0.94  CALCIUM 8.5* 8.5 8.1* 7.8* 7.9*  MG  --   --  1.5*  --  1.7   GFR Estimated Creatinine Clearance: 72.3 mL/min (by C-G formula based on SCr of 0.94 mg/dL). Liver Function Tests: Recent Labs  Lab 09/23/17 1401 09/25/17 1301 09/26/17 0943 09/28/17 0500  AST 13* 12 16 11*  ALT 11* 26 21 16*  ALKPHOS 119 87 65 58  BILITOT 1.0 0.3 0.3 1.0  PROT 5.9* 5.5* 5.1* 4.5*  ALBUMIN 3.3* 2.8* 2.6* 2.1*   Recent Labs  Lab 09/25/17 1301 09/26/17 0943  LIPASE <10* <10*  AMYLASE 15*  --    No results for input(s): AMMONIA in the last 168 hours. Coagulation profile No results for input(s): INR, PROTIME in the last 168 hours.  CBC: Recent Labs  Lab 09/23/17 1401 09/25/17 1301 09/26/17 0943 09/27/17 0409  WBC 14.9* 3.1* 2.9* 3.4*  NEUTROABS 13.2* 1.7 1.2* 1.8  HGB 12.9*  --  10.3* 10.1*  HCT 37.6* 34.4* 30.1* 29.8*  MCV 89.1 90.2 88.8 88.4  PLT 78* 89* 86* 103*   Cardiac Enzymes: No results for input(s): CKTOTAL, CKMB, CKMBINDEX, TROPONINI in the last 168 hours. BNP (last 3 results) No results for input(s): PROBNP in the last 8760 hours. CBG: No results for input(s): GLUCAP in the last 168 hours. D-Dimer: No results for input(s): DDIMER in the last 72 hours. Hgb A1c: No results for input(s): HGBA1C in the last 72 hours. Lipid Profile: No results for input(s): CHOL, HDL, LDLCALC, TRIG, CHOLHDL, LDLDIRECT in the last 72 hours. Thyroid function studies: No results for input(s): TSH, T4TOTAL, T3FREE, THYROIDAB in the last 72 hours.  Invalid input(s): FREET3 Anemia work up: No results for input(s): VITAMINB12, FOLATE, FERRITIN, TIBC, IRON, RETICCTPCT in the last 72 hours. Sepsis Labs: Recent Labs  Lab 09/23/17 1401 09/25/17 1301 09/26/17 0943 09/27/17 0409  WBC 14.9* 3.1* 2.9* 3.4*     Microbiology Recent Results (from the past 240 hour(s))  C difficile quick scan w PCR reflex     Status: None   Collection Time: 09/27/17  2:27 PM  Result Value Ref Range Status   C Diff antigen NEGATIVE NEGATIVE Final   C Diff toxin NEGATIVE NEGATIVE Final   C Diff interpretation No C. difficile detected.  Final     Medications:   . aspirin EC  81 mg Oral Daily  . atorvastatin  20 mg Oral Daily  . clopidogrel  75 mg Oral Daily  . lisinopril  10 mg Oral Daily  . magnesium oxide  400 mg Oral BID  . protein supplement  1 scoop Oral TID WC  . protein supplement  8 oz Oral  Q24H   Continuous Infusions:    LOS: 1 day   Charlynne Cousins  [facility] Pager [phone]  *Please refer to [email or web address], password [email or web address] to get updated schedule on who will round on this patient, as hospitalists switch teams weekly. If 7PM-7AM, please contact night-coverage at [email or web address], password [email or web address] for any overnight needs.  09/28/2017, 8:00 AM

## 2017-09-28 NOTE — Progress Notes (Signed)
IP PROGRESS NOTE  Subjective:   Mr. Coulthard reports the diarrhea has slowed.  He continues to have abdominal pain.  The stool C. difficile toxin returned negative.  Objective: Vital signs in last 24 hours: Blood pressure (!) 141/58, pulse 89, temperature 99.4 F (37.4 C), temperature source Oral, resp. rate 20, height 5' 10"  (1.778 m), weight 173 lb 11.6 oz (78.8 kg), SpO2 95 %.  Intake/Output from previous day: 01/16 0701 - 01/17 0700 In: 120 [P.O.:120] Out: -   Physical Exam:  HEENT: No thrush or ulcers  Lungs: Clear bilaterally, no respiratory distress Cardiac: Regular rate and rhythm Abdomen: Tender throughout the abdomen, no mass Extremities: No leg edema   Portacath/PICC-without erythema  Lab Results: Recent Labs    09/26/17 0943 09/27/17 0409  WBC 2.9* 3.4*  HGB 10.3* 10.1*  HCT 30.1* 29.8*  PLT 86* 103*   ANC 1.8 BMET Recent Labs    09/27/17 0409 09/28/17 0500  NA 128* 130*  K 3.7 3.3*  CL 102 102  CO2 20* 20*  GLUCOSE 127* 124*  BUN 18 15  CREATININE 0.95 0.94  CALCIUM 7.8* 7.9*    No results found for: CEA1  Studies/Results: No results found.  Medications: I have reviewed the patient's current medications.  Assessment/Plan: 1. Adenocarcinoma of the pancreas head/neck, status post an EUS biopsy 08/02/2017 confirming adenocarcinoma ? EUS 08/02/2017-pancreas head/neck mass, abutment of the portal vein, no peripancreatic adenopathy ? MRI abdomen 07/27/2017- head/neck pancreas mass with mass-effect on the portal splenic confluence, no involvement of the celiac axis or superior mesenteric artery, no evidence of metastatic disease ? PET scan 08/31/2017-very hypermetabolism at the pancreatic head mass, no evidence of metastatic disease. The isolated right lower lobe nodule below PET resolution, left sided renal mass with decreased activity relative to the normal adjacent kidney ? Cycle 1 FOLFIRINOX 09/16/2017  2. Left renal mass consistent with a  renal cell carcinoma seen on CT 07/21/2017 and MRI 07/28/2017  3.Severe aortic stenosis-TAVR12/07/2017  4.Coronary artery disease, status post myocardial infarction March 2018, status post placement of coronary stents  5.Diabetes  6.History of tobacco use  7.Hypertension  8.   Port-A-Cath placement 09/15/2017, Dr. Barry Dienes  9.   Nausea and diarrhea secondary to chemotherapy enteritis-improved  10.  Dehydration  11.  Pancytopenia secondary to chemotherapy- improved  Mr. Tallman continues to have abdominal pain.  The pain appears out of proportion to chemotherapy induced enteritis.  He could have diverticulitis area   Recommendations: 1.   Continue intravenous hydration with potassium supplementation 2.   CT abdomen-pelvis    LOS: 1 day   [name], MD   09/28/2017, 9:22 AM

## 2017-09-29 DIAGNOSIS — R1084 Generalized abdominal pain: Secondary | ICD-10-CM

## 2017-09-29 DIAGNOSIS — R197 Diarrhea, unspecified: Secondary | ICD-10-CM

## 2017-09-29 DIAGNOSIS — R112 Nausea with vomiting, unspecified: Secondary | ICD-10-CM

## 2017-09-29 DIAGNOSIS — R933 Abnormal findings on diagnostic imaging of other parts of digestive tract: Secondary | ICD-10-CM

## 2017-09-29 LAB — BASIC METABOLIC PANEL
Anion gap: 6 (ref 5–15)
BUN: 16 mg/dL (ref 6–20)
CO2: 22 mmol/L (ref 22–32)
Calcium: 7.8 mg/dL — ABNORMAL LOW (ref 8.9–10.3)
Chloride: 101 mmol/L (ref 101–111)
Creatinine, Ser: 0.85 mg/dL (ref 0.61–1.24)
GFR calc Af Amer: >60 mL/min (ref >60)
GFR calc non Af Amer: >60 mL/min (ref >60)
Glucose, Bld: 167 mg/dL — ABNORMAL HIGH (ref 65–99)
Potassium: 3.9 mmol/L (ref 3.5–5.1)
Sodium: 129 mmol/L — ABNORMAL LOW (ref 135–145)

## 2017-09-29 LAB — LIPASE, BLOOD: Lipase: 19 U/L (ref 11–51)

## 2017-09-29 MED ORDER — SODIUM CHLORIDE 0.9 % IV SOLN
INTRAVENOUS | Status: AC
  Administered 2017-09-29 (×2): via INTRAVENOUS

## 2017-09-29 NOTE — Progress Notes (Signed)
PT Cancellation Note  Patient Details Name: Manuel Olson MRN: [id number] DOB: [birth year]   Cancelled Treatment:    Reason Eval/Treat Not Completed: Fatigue/lethargy limiting ability to participate, sleeping, family reports that patient had a bad night.    Claretha Cooper 09/29/2017, 11:48 AM Tresa Endo PT [phone]

## 2017-09-29 NOTE — Progress Notes (Addendum)
IP PROGRESS NOTE  Subjective:   [name] continues to have nausea and abdominal pain.  His wife reports he had 3 episodes of diarrhea yesterday.  Objective: Vital signs in last 24 hours: Blood pressure 131/63, pulse 88, temperature 97.6 F (36.4 C), temperature source Oral, resp. rate 20, height 5' 10"  (1.778 m), weight 168 lb 10.4 oz (76.5 kg), SpO2 96 %.  Intake/Output from previous day: 01/17 0701 - 01/18 0700 In: 1617.5 [P.O.:700; I.V.:617.5; IV Piggyback:300] Out: 2 [Urine:1; Stool:1]  Physical Exam:  HEENT: No thrush or ulcers  Lungs: Clear anteriorly, no respiratory distress Cardiac: Regular rate and rhythm Abdomen: Mildly distended, hypoactive bowel sounds, tender throughout the abdomen, no mass Extremities: No leg edema   Portacath/PICC-without erythema  Lab Results: Recent Labs    09/27/17 0409  WBC 3.4*  HGB 10.1*  HCT 29.8*  PLT 103*   ANC 1.8 BMET Recent Labs    09/28/17 0500 09/29/17 1114  NA 130* 129*  K 3.3* 3.9  CL 102 101  CO2 20* 22  GLUCOSE 124* 167*  BUN 15 16  CREATININE 0.94 0.85  CALCIUM 7.9* 7.8*    No results found for: CEA1  Studies/Results: Ct Abdomen Pelvis W Contrast  Result Date: 09/28/2017 CLINICAL DATA:  Abdominal pain.  History of pancreas cancer. EXAM: CT ABDOMEN AND PELVIS WITH CONTRAST TECHNIQUE: Multidetector CT imaging of the abdomen and pelvis was performed using the standard protocol following bolus administration of intravenous contrast. CONTRAST:  170m ISOVUE-300 IOPAMIDOL (ISOVUE-300) INJECTION 61% COMPARISON:  07/20/2017 FINDINGS: Lower chest: No acute abnormality. Hepatobiliary: No suspicious liver abnormality. The gallbladder appears normal. Pancreas: The ill defined mass described previously in the head neck region of the pancreas is again noted abutting the portal venous confluence, image 55 of series 7. 2.9 x 2.2 cm, image 55 of series 7. Previously 3.1 x 3.7 cm. There is dilatation of the main duct with  atrophy of the pancreatic parenchyma in the body and tail. Spleen: Normal in size without focal abnormality. Adrenals/Urinary Tract: Small cysts in the right kidney noted. No mass or hydronephrosis. Heterogeneously enhancing mass involving the upper pole of the left kidney is again identified measuring approximately 6.8 cm, image 80 of series 9. Similar to MRI from 07/27/2017. The left renal vein appears patent. There is no hydronephrosis. Urinary bladder is normal. Stomach/Bowel: Stomach is unremarkable. Abnormal increase caliber of the proximal and mid small bowel loops with air-fluid levels identified measuring up to 4.2 cm. Distal small bowel loops are decreased in caliber and demonstrate abnormal bowel wall edema/thickening measuring up to 9 mm. No significant bowel wall thickening of the: No evidence for perforation. No abscess. No pneumatosis identified. Extensive distal colonic diverticulosis without acute diverticulitis. Vascular/Lymphatic: Aortic atherosclerosis. Infrarenal abdominal aorta is increased in caliber measuring up to 2.6 cm, image 123 of series 9. 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 no upper abdominal adenopathy. No pelvic or inguinal adenopathy. Reproductive: Prostate is unremarkable. Other: There is a small to moderate volume of ascites within the abdomen and pelvis. No discrete fluid collections identified. Musculoskeletal: The bones appear diffusely osteopenic. Stable chronic compression fracture at L2. No suspicious bone lesions. IMPRESSION: 1. Wall thickening and inflammation involving distal small bowel loops identified. Findings compatible with acute enteritis which may be inflammatory or infectious in etiology. Ischemic enteritis is considered less favored although there is extensive atherosclerotic disease involving  the abdomen and pelvis, the superior mesenteric  artery and its branches appear patent. 2. Pathologic dilatation of the proximal and mid small bowel loops compatible with bowel obstruction. 3. Head of pancreas mass involving the portal venous confluence is again noted with associated main duct dilatation and atrophy of the body and tail of pancreas. Mass is stable to slightly decreased in size from previous exam. No specific findings identified to suggest metastatic disease. 4. Heterogeneous lead enhancing mass involving the upper pole of left kidney compatible with renal cell carcinoma. 5.  Aortic Atherosclerosis (ICD10-I70.0). 6. Ectatic abdominal aorta at risk for aneurysm development. Recommend followup by ultrasound in 5 years. This recommendation follows ACR consensus guidelines: White Paper of the ACR Incidental Findings Committee II on Vascular Findings. J Am Coll Radiol 2013; 10:789-794. 7. Aneurysmal dilatation of the right common iliac artery and focal thrombosed aneurysm is noted involving the right internal iliac artery. Electronically Signed   By: Kerby Moors M.D.   On: 09/28/2017 16:32    Medications: I have reviewed the patient's current medications.  Assessment/Plan: 1. Adenocarcinoma of the pancreas head/neck, status post an EUS biopsy 08/02/2017 confirming adenocarcinoma ? EUS 08/02/2017-pancreas head/neck mass, abutment of the portal vein, no peripancreatic adenopathy ? MRI abdomen 07/27/2017- head/neck pancreas mass with mass-effect on the portal splenic confluence, no involvement of the celiac axis or superior mesenteric artery, no evidence of metastatic disease ? PET scan 08/31/2017-very hypermetabolism at the pancreatic head mass, no evidence of metastatic disease. The isolated right lower lobe nodule below PET resolution, left sided renal mass with decreased activity relative to the normal adjacent kidney ? Cycle 1 FOLFIRINOX 09/16/2017  2. 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   Port-A-Cath placement 09/15/2017, Dr. Barry Dienes  9.   Nausea, diarrhea, and abdominal pain- chemotherapy enteritis versus bowel obstruction versus ischemic enteritis  10.  Dehydration  11.  Pancytopenia secondary to chemotherapy- improved  [name] continues to have severe abdominal pain.  Diarrhea appears improved.  The fever has improved.  The abdomen is distended.  The pain may be related to enteritis versus a small bowel obstruction.  The pain and CT findings appear atypical for chemotherapy enteritis.  Recommendations: 1.   Continue intravenous hydration 2.   Analgesics as needed for pain 3.   Consider GI and surgical consultation 4.   Check CBC and amylase/lipase  Please call Oncology as needed over the weekend.  I will check on him 10/02/2017.    LOS: 2 days   [name], MD   09/29/2017, 1:31 PM

## 2017-09-29 NOTE — Care Management Note (Signed)
Case Management Note  Patient Details  Name: Manuel Olson MRN: [id number] Date of Birth: [birth year]  Subjective/Objective:  72 yo admitted with Ileitis terminal. History significant for pancreatic cancer on chemotherapy, status post aortic valve replacement    Action/Plan: CM went to room. Wife asked me to speak with her outside room as pt was sleeping soundly. This CM spoke with wife about PT recommendations for HHPT. Wife politely declines home health services at this time. Home Health Provider List left with wife in case they change their minds.  Expected Discharge Date:  (unknown)               Expected Discharge Plan:  Home/Self Care  In-House Referral:     Discharge planning Services  CM Consult  Post Acute Care Choice:  Home Health Choice offered to:  Spouse  DME Arranged:    DME Agency:     HH Arranged:    [place] Agency:     Status of Service:  Completed, signed off  If discussed at [employer] of Stay Meetings, dates discussed:    Additional CommentsLynnell Catalan, RN 09/29/2017, 10:17 AM  [phone]

## 2017-09-29 NOTE — Progress Notes (Signed)
[facility] PROGRESS NOTE    Progress Note  Manuel Olson  ZRA:[id number] DOB: November 04, 1945 DOA: 09/26/2017 PCP: Renee Rival, NP     Brief Narrative:   Manuel Olson is an 72 y.o. male past medical history significant for pancreatic cancer on chemotherapy, status post aortic valve replacement comes in for 1 week of nausea and vomiting not eating well he has seen his oncologist several times has received IV fluids and is felt better but comes in today with intractable nausea and vomiting.  Assessment/Plan:   Dehydration: Cont. IV fluid hydration.  Hyponatremia: Likely due to hypovolemia, his hyponatremia is improving.  Continue l IV fluids recheck basic metabolic panel in the morning.  New onset of fevers/abdominal pain due to terminal ileitis: He is now afebrile, C. difficile PCR is negative. CT scan of the abdomen and pelvis he relates he is having new abdominal pain.  Has no peritoneal signs, no rebound or guarding. Started empirically on IV ciprofloxacin and Flagyl.  Severe aortic stenosis/S/P TAVR (transcatheter aortic valve replacement) Stable.  Cancer of head of pancreas [facility]) Oncology following recommended to hold chemotherapy for now appreciate assistance.  Essential hypertension, benign Hold antihypertensive medication.  CAD (coronary artery disease) Stable continue Lipitor,aspirin and Plavix.  Thrombocytopenia: Is improving slowly.  [place] due to chemotherapy.  Antineoplastic induced anemia: Remained stable continue to monitor.   DVT prophylaxis: lovenox Family Communication:wife Disposition Plan/Barrier to D/C: once able to tolerate orals. Code Status:     Code Status Orders  (From admission, onward)        Start     Ordered   09/26/17 1752  Full code  Continuous     09/26/17 1751    Code Status History    Date Active Date Inactive Code Status Order ID Comments User Context   08/22/2017 18:05 08/24/2017 13:51 Full Code  [id number]  Gaye Pollack, MD Inpatient   07/05/2017 09:25 07/05/2017 15:34 Full Code [id number]  [name], MD Inpatient    Advance Directive Documentation     Most Recent Value  Type of Advance Directive  Healthcare Power of Attorney, Living will  Pre-existing out of facility DNR order (yellow form or pink MOST form)  No data  "MOST" Form in Place?  No data        IV Access:    Peripheral IV   Procedures and diagnostic studies:   Ct Abdomen Pelvis W Contrast  Result Date: 09/28/2017 CLINICAL DATA:  Abdominal pain.  History of pancreas cancer. EXAM: CT ABDOMEN AND PELVIS WITH CONTRAST TECHNIQUE: Multidetector CT imaging of the abdomen and pelvis was performed using the standard protocol following bolus administration of intravenous contrast. CONTRAST:  111m ISOVUE-300 IOPAMIDOL (ISOVUE-300) INJECTION 61% COMPARISON:  07/20/2017 FINDINGS: Lower chest: No acute abnormality. Hepatobiliary: No suspicious liver abnormality. The gallbladder appears normal. Pancreas: The ill defined mass described previously in the head neck region of the pancreas is again noted abutting the portal venous confluence, image 55 of series 7. 2.9 x 2.2 cm, image 55 of series 7. Previously 3.1 x 3.7 cm. There is dilatation of the main duct with atrophy of the pancreatic parenchyma in the body and tail. Spleen: Normal in size without focal abnormality. Adrenals/Urinary Tract: Small cysts in the right kidney noted. No mass or hydronephrosis. Heterogeneously enhancing mass involving the upper pole of the left kidney is again identified measuring approximately 6.8 cm, image 80 of series 9. Similar to MRI from 07/27/2017. The left renal vein appears patent. There  is no hydronephrosis. Urinary bladder is normal. Stomach/Bowel: Stomach is unremarkable. Abnormal increase caliber of the proximal and mid small bowel loops with air-fluid levels identified measuring up to 4.2 cm. Distal small bowel loops are decreased in  caliber and demonstrate abnormal bowel wall edema/thickening measuring up to 9 mm. No significant bowel wall thickening of the: No evidence for perforation. No abscess. No pneumatosis identified. Extensive distal colonic diverticulosis without acute diverticulitis. Vascular/Lymphatic: Aortic atherosclerosis. Infrarenal abdominal aorta is increased in caliber measuring up to 2.6 cm, image 123 of series 9. 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 no upper abdominal adenopathy. No pelvic or inguinal adenopathy. Reproductive: Prostate is unremarkable. Other: There is a small to moderate volume of ascites within the abdomen and pelvis. No discrete fluid collections identified. Musculoskeletal: The bones appear diffusely osteopenic. Stable chronic compression fracture at L2. No suspicious bone lesions. IMPRESSION: 1. Wall thickening and inflammation involving distal small bowel loops identified. Findings compatible with acute enteritis which may be inflammatory or infectious in etiology. Ischemic enteritis is considered less favored although there is extensive atherosclerotic disease involving the abdomen and pelvis, the superior mesenteric artery and its branches appear patent. 2. Pathologic dilatation of the proximal and mid small bowel loops compatible with bowel obstruction. 3. Head of pancreas mass involving the portal venous confluence is again noted with associated main duct dilatation and atrophy of the body and tail of pancreas. Mass is stable to slightly decreased in size from previous exam. No specific findings identified to suggest metastatic disease. 4. Heterogeneous lead enhancing mass involving the upper pole of left kidney compatible with renal cell carcinoma. 5.  Aortic Atherosclerosis (ICD10-I70.0). 6. Ectatic abdominal aorta at risk for aneurysm development. Recommend followup by ultrasound  in 5 years. This recommendation follows ACR consensus guidelines: White Paper of the ACR Incidental Findings Committee II on Vascular Findings. J Am Coll Radiol 2013; 10:789-794. 7. Aneurysmal dilatation of the right common iliac artery and focal thrombosed aneurysm is noted involving the right internal iliac artery. Electronically Signed   By: Kerby Moors M.D.   On: 09/28/2017 16:32     Medical Consultants:    None.  Anti-Infectives:   None  Subjective:    Manuel Olson you abdominal pain that got worse this morning, continues to have poor oral intake.  Objective:    Vitals:   09/28/17 0453 09/28/17 1621 09/28/17 2225 09/29/17 0732  BP: (!) 141/58 (!) 148/70 131/64 131/63  Pulse: 89 91 89 88  Resp: 20 18 16 20   Temp: 99.4 F (37.4 C) 98.5 F (36.9 C) 97.9 F (36.6 C) 97.6 F (36.4 C)  TempSrc: Oral Oral Oral Oral  SpO2: 95% 97% 96% 96%  Weight:    76.5 kg (168 lb 10.4 oz)  Height:        Intake/Output Summary (Last 24 hours) at 09/29/2017 0848 Last data filed at 09/28/2017 1827 Gross per 24 hour  Intake 1617.5 ml  Output 2 ml  Net 1615.5 ml   Filed Weights   09/26/17 0940 09/27/17 0548 09/29/17 0732  Weight: 73 kg (161 lb) 78.8 kg (173 lb 11.6 oz) 76.5 kg (168 lb 10.4 oz)    Exam: General exam: In no acute distress. Respiratory system: Good air movement and clear to auscultation. Cardiovascular system: S1 & S2 heard, RRR. No JVD. Gastrointestinal system: Active bowel sounds, soft diffuse tenderness no rebound  or guarding. Central nervous system: Alert and oriented. No focal neurological deficits. Extremities: No pedal edema. Skin: No rashes, lesions or ulcers Psychiatry: Judgement and insight appear normal. Mood & affect appropriate.    Data Reviewed:    Labs: Basic Metabolic Panel: Recent Labs  Lab 09/23/17 1401 09/25/17 1301 09/26/17 0943 09/27/17 0409 09/28/17 0500  NA 127* 130* 129* 128* 130*  K 3.9 3.7 3.3* 3.7 3.3*  CL 96* 101  99* 102 102  CO2 22 21* 21* 20* 20*  GLUCOSE 322* 208* 161* 127* 124*  BUN 42* 29* 24* 18 15  CREATININE 1.54*  --  1.05 0.95 0.94  CALCIUM 8.5* 8.5 8.1* 7.8* 7.9*  MG  --   --  1.5*  --  1.7   GFR Estimated Creatinine Clearance: 72.3 mL/min (by C-G formula based on SCr of 0.94 mg/dL). Liver Function Tests: Recent Labs  Lab 09/23/17 1401 09/25/17 1301 09/26/17 0943 09/28/17 0500  AST 13* 12 16 11*  ALT 11* 26 21 16*  ALKPHOS 119 87 65 58  BILITOT 1.0 0.3 0.3 1.0  PROT 5.9* 5.5* 5.1* 4.5*  ALBUMIN 3.3* 2.8* 2.6* 2.1*   Recent Labs  Lab 09/25/17 1301 09/26/17 0943  LIPASE <10* <10*  AMYLASE 15*  --    No results for input(s): AMMONIA in the last 168 hours. Coagulation profile No results for input(s): INR, PROTIME in the last 168 hours.  CBC: Recent Labs  Lab 09/23/17 1401 09/25/17 1301 09/26/17 0943 09/27/17 0409  WBC 14.9* 3.1* 2.9* 3.4*  NEUTROABS 13.2* 1.7 1.2* 1.8  HGB 12.9*  --  10.3* 10.1*  HCT 37.6* 34.4* 30.1* 29.8*  MCV 89.1 90.2 88.8 88.4  PLT 78* 89* 86* 103*   Cardiac Enzymes: No results for input(s): CKTOTAL, CKMB, CKMBINDEX, TROPONINI in the last 168 hours. BNP (last 3 results) No results for input(s): PROBNP in the last 8760 hours. CBG: No results for input(s): GLUCAP in the last 168 hours. D-Dimer: No results for input(s): DDIMER in the last 72 hours. Hgb A1c: No results for input(s): HGBA1C in the last 72 hours. Lipid Profile: No results for input(s): CHOL, HDL, LDLCALC, TRIG, CHOLHDL, LDLDIRECT in the last 72 hours. Thyroid function studies: No results for input(s): TSH, T4TOTAL, T3FREE, THYROIDAB in the last 72 hours.  Invalid input(s): FREET3 Anemia work up: No results for input(s): VITAMINB12, FOLATE, FERRITIN, TIBC, IRON, RETICCTPCT in the last 72 hours. Sepsis Labs: Recent Labs  Lab 09/23/17 1401 09/25/17 1301 09/26/17 0943 09/27/17 0409  WBC 14.9* 3.1* 2.9* 3.4*   Microbiology Recent Results (from the past 240 hour(s))   Respiratory Panel by PCR     Status: None   Collection Time: 09/27/17 10:07 AM  Result Value Ref Range Status   Adenovirus NOT DETECTED NOT DETECTED Final   Coronavirus 229E NOT DETECTED NOT DETECTED Final   Coronavirus HKU1 NOT DETECTED NOT DETECTED Final   Coronavirus NL63 NOT DETECTED NOT DETECTED Final   Coronavirus OC43 NOT DETECTED NOT DETECTED Final   Metapneumovirus NOT DETECTED NOT DETECTED Final   Rhinovirus / Enterovirus NOT DETECTED NOT DETECTED Final   Influenza A NOT DETECTED NOT DETECTED Final   Influenza B NOT DETECTED NOT DETECTED Final   Parainfluenza Virus 1 NOT DETECTED NOT DETECTED Final   Parainfluenza Virus 2 NOT DETECTED NOT DETECTED Final   Parainfluenza Virus 3 NOT DETECTED NOT DETECTED Final   Parainfluenza Virus 4 NOT DETECTED NOT DETECTED Final   Respiratory Syncytial Virus NOT DETECTED NOT DETECTED Final  Bordetella pertussis NOT DETECTED NOT DETECTED Final   Chlamydophila pneumoniae NOT DETECTED NOT DETECTED Final   Mycoplasma pneumoniae NOT DETECTED NOT DETECTED Final  C difficile quick scan w PCR reflex     Status: None   Collection Time: 09/27/17  2:27 PM  Result Value Ref Range Status   C Diff antigen NEGATIVE NEGATIVE Final   C Diff toxin NEGATIVE NEGATIVE Final   C Diff interpretation No C. difficile detected.  Final     Medications:   . aspirin EC  81 mg Oral Daily  . atorvastatin  20 mg Oral Daily  . clopidogrel  75 mg Oral Daily  . lisinopril  10 mg Oral Daily  . magnesium oxide  400 mg Oral BID  . protein supplement  1 scoop Oral TID WC  . protein supplement  8 oz Oral Q24H   Continuous Infusions: . ciprofloxacin Stopped (09/29/17 0806)  . metronidazole 500 mg (09/29/17 0814)     LOS: 2 days   [facility] Pager [phone]  *Please refer to [email or web address], password [email or web address] to get updated schedule on who will round on this patient, as hospitalists switch teams weekly. If 7PM-7AM, please contact  night-coverage at [email or web address], password [email or web address] for any overnight needs.  09/29/2017, 8:48 AM

## 2017-09-29 NOTE — Consult Note (Signed)
[facility] Consult: 12:18 PM 09/29/2017  LOS: 2 days    Referring Provider: Dr Olevia Bowens  Primary Care Physician:  Renee Rival, NP Primary Gastroenterologist:  Dr [name]     Reason for Consultation:  N/V and abd pain.     HPI: Manuel Olson is a 72 y.o. male.  Diagnosed with pancreatic and renal cell cancer in 07/2017.  COPD.  MI spring 2018, s/p cath and stents x 2 in [place], on Plavix since.  Dx with Ao valve stenosis.  LVEF 45 to 50%.   S/p TAVR 08/22/17.  No previous colonoscopy or EGD At time of TAVR wup in fall 2018, pancreatic neck mass and left renal mass seen on imaging.   08/02/17 EUS.  Dr [name] for pancreatic mass and PD dilatation.  18 x 25 mm irregular mass in neck of pancreas, abutting main PV. Cytology: adenocarcinoma.    Dr Barry Dienes considered mass "borderline resectable"  Established with Dr Ammie Dalton late 07/2018 for pancreatic and renal cell cancer.  Plan to start chemo after convalescing from TAVR and eventual simultaneous pancreatic and nephrectomy surgery.  Porta cath placed 09/15/17.  Cycle 1FOLFIRINOX  started 09/16/17.  Immediately after chemo suffered hiccups. Day 4 post chemo started nausea/diarrhea. Not much emesis.  Later not helped by imodium, Lomotil added and helped.  Stools went from 5 or 6/day to 3-4.  No bloody stool . Non-focal abd pain started a few days ago.    Anorexia, 10 # wt loss, weakness.  He can not get up or walk without assistance.  ED visit 09/23/17, received 2 liters IVF, felt better.  .   Admitted 1/15 with same sxs and dehydration.   09/28/17 CT ab/pelvis: thickened loops distal SB: acute enteritis.  Pathologic dilation/obstruction of prox and mid SB.  Stable mass in HOP.  Left kidney mass.  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   Na low at 129.  Glucose 167.  LFTs and lipase non-elevated.   + pancytopenia: WBCs 2.9.  Hgb 10.1.  Platelets 86 >> 103 Stool C diff negative.    Current  treatment with IVF, cipro and flagyl. Still watery stool but overall decreased frequency.  Still very weak, tired.     Past Medical History:  Diagnosis Date  . Arthritis   . CAD (coronary artery disease)    a. 11/2016 in [place]: STEMI s/p DES to mid LAD, DES to diagonal, DES x 2 to proximal and mid RCA   . Essential hypertension   . Former smoker   . GERD (gastroesophageal reflux disease)   . Hyperlipidemia   . Pancreatic cancer ([place])   . Renal mass   . S/P TAVR (transcatheter aortic valve replacement)    a. 08/2017:  Edwards Sapien 3 THV (size 26 mm, model #9600CM26A , serial # [id number])  . Severe aortic stenosis    a. 08/2017: s/p TAVR by Dr. Angelena Form and Dr. Cyndia Bent.   . 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 Type 2 diabetes mellitus (  [facility])     Past Surgical History:  Procedure Laterality Date  . APPENDECTOMY    . EUS N/A 08/02/2017   Procedure: UPPER ENDOSCOPIC ULTRASOUND (EUS) RADIAL;  Surgeon: [name], MD;  Location: [facility];  Service: Endoscopy;  Laterality: N/A;  . EYE SURGERY Left   . HAND SURGERY Bilateral   . HERNIA REPAIR Right   . PORTACATH PLACEMENT N/A 09/15/2017   Procedure: INSERTION PORT-A-CATH;  Surgeon: [name], MD;  Location: [facility];  Service: General;  Laterality: N/A;  . RIGHT/LEFT HEART CATH AND CORONARY ANGIOGRAPHY N/A 07/05/2017   Procedure: RIGHT/LEFT HEART CATH AND CORONARY ANGIOGRAPHY;  Surgeon: [name], MD;  Location: [place] [facility];  Service: Cardiovascular;  Laterality: N/A;  . SHOULDER ARTHROSCOPY WITH ROTATOR CUFF REPAIR Left   . TEE WITHOUT CARDIOVERSION N/A 08/22/2017   Procedure: TRANSESOPHAGEAL ECHOCARDIOGRAM (TEE);  Surgeon: Burnell Blanks, MD;  Location: [facility];  Service: Open Heart Surgery;  Laterality: N/A;  .  TRANSCATHETER AORTIC VALVE REPLACEMENT, TRANSFEMORAL N/A 08/22/2017   Procedure: TRANSCATHETER AORTIC VALVE REPLACEMENT, TRANSFEMORAL;  Surgeon: Burnell Blanks, MD;  Location: [facility];  Service: Open Heart Surgery;  Laterality: N/A;    Prior to Admission medications   Medication Sig Start Date End Date Taking? Authorizing Provider  aspirin EC 81 MG tablet Take 1 tablet (81 mg total) by mouth daily. 01/26/17  Yes Satira Sark, MD  atorvastatin (LIPITOR) 20 MG tablet Take 1 tablet (20 mg total) by mouth daily. 08/25/17  Yes Satira Sark, MD  baclofen (LIORESAL) 10 MG tablet Take 0.5 tablets (5 mg total) by mouth 3 (three) times daily as needed for muscle spasms. 09/18/17  Yes Ladell Pier, MD  carvedilol (COREG) 3.125 MG tablet TAKE (1) TABLET BY MOUTH TWICE A DAY WITH MEALS (BREAKFAST AND SUPPER) 09/11/17  Yes Satira Sark, MD  clopidogrel (PLAVIX) 75 MG tablet Take 1 tablet (75 mg total) by mouth daily. 07/06/17  Yes Satira Sark, MD  diphenoxylate-atropine (LOMOTIL) 2.5-0.025 MG tablet Take 2 tablets by mouth 4 (four) times daily as needed for diarrhea or loose stools. 09/25/17  Yes Owens Shark, NP  lidocaine-prilocaine (EMLA) cream Apply 1 application topically as needed. Apply small amount of cream over port site 1-2 hrs prior to chemo, cover with plastic wrap. Patient taking differently: Apply 1 application topically as needed (for recieving chemo in port site). Apply small amount of cream over port site 1-2 hrs prior to chemo, cover with plastic wrap. 09/07/17  Yes Owens Shark, NP  lisinopril (PRINIVIL,ZESTRIL) 10 MG tablet TAKE 1 TABLET BY MOUTH ONCE DAILY. 09/11/17  Yes Satira Sark, MD  ondansetron (ZOFRAN) 8 MG tablet Take 1 tablet (8 mg total) by mouth every 8 (eight) hours as needed for nausea or vomiting. 09/25/17  Yes Owens Shark, NP  prochlorperazine (COMPAZINE) 5 MG tablet Take 1 tablet (5 mg total) by mouth every 6 (six) hours as needed for  nausea or vomiting. 09/07/17  Yes Owens Shark, NP  Blood Glucose Monitoring Suppl (ONETOUCH VERIO) w/Device KIT 1 each by Does not apply route as needed. Patient not taking: Reported on 09/26/2017 09/01/17   Cassandria Anger, MD  glucose blood [facility] VERIO) test strip Use as instructed Patient not taking: Reported on 09/26/2017 09/01/17   Cassandria Anger, MD  insulin aspart (FIASP FLEXTOUCH) 100 UNIT/ML FlexPen Inject 10-16 Units into the skin 3 (three) times daily with meals. Patient not taking:  Reported on 09/26/2017 09/01/17   Cassandria Anger, MD  insulin degludec (TRESIBA) 100 UNIT/ML SOPN FlexTouch Pen Inject 0.4 mLs (40 Units total) into the skin daily at 10 pm. Patient not taking: Reported on 09/26/2017 09/01/17   Cassandria Anger, MD  Insulin Pen Needle (B-D ULTRAFINE III SHORT PEN) 31G X 8 MM MISC 1 each by Does not apply route as directed. Patient not taking: Reported on 09/26/2017 08/25/17   Cassandria Anger, MD  oxyCODONE (OXY IR/ROXICODONE) 5 MG immediate release tablet Take 0.5-1 tablets (2.5-5 mg total) by mouth every 6 (six) hours as needed for severe pain or breakthrough pain. Patient not taking: Reported on 09/26/2017 09/15/17   [name], MD    Scheduled Meds: . aspirin EC  81 mg Oral Daily  . atorvastatin  20 mg Oral Daily  . clopidogrel  75 mg Oral Daily  . lisinopril  10 mg Oral Daily  . magnesium oxide  400 mg Oral BID  . protein supplement  1 scoop Oral TID WC  . protein supplement  8 oz Oral Q24H   Infusions: . sodium chloride 100 mL/hr at 09/29/17 1118  . ciprofloxacin Stopped (09/29/17 0806)  . metronidazole Stopped (09/29/17 1119)   PRN Meds: acetaminophen, baclofen, morphine injection, ondansetron (ZOFRAN) IV, ondansetron, prochlorperazine   Allergies as of 09/26/2017  . (No Known Allergies)    Family History  Problem Relation Age of Onset  . Lupus Mother   . CAD Brother   . Hypertension Brother     Social History     Socioeconomic History  . Marital status: Married    Spouse name: Not on file  . Number of children: Not on file  . Years of education: Not on file  . Highest education level: Not on file  Social Needs  . Financial resource strain: Not on file  . Food insecurity - worry: Not on file  . Food insecurity - inability: Not on file  . Transportation needs - medical: Not on file  . Transportation needs - non-medical: Not on file  Occupational History  . Not on file  Tobacco Use  . Smoking status: Former Smoker    Packs/day: 1.00    Years: 57.00    Pack years: 57.00    Types: Cigarettes    Start date: 09/12/1958    Last attempt to quit: 04/12/2016    Years since quitting: 1.4  . Smokeless tobacco: Never Used  Substance and Sexual Activity  . Alcohol use: No  . Drug use: No  . Sexual activity: Not on file  Other Topics Concern  . Not on file  Social History Narrative  . Not on file    REVIEW OF SYSTEMS: Constitutional:  Per HPI ENT:  No nose bleeds Pulm:  No SOB or cough CV:  No palpitations, no LE edema.  GU:  No hematuria, no frequency GI:  Per HPI Heme:  No unusual bleeding or bruising   Transfusions:  None.   Neuro:  No headaches, no peripheral tingling or numbness Derm:  No itching, no rash or sores.  Endocrine:  No sweats or chills.  No polyuria or dysuria Immunization:  Not queried Travel:  None beyond local counties in last few months.    PHYSICAL EXAM: Vital signs in last 24 hours: Vitals:   09/28/17 2225 09/29/17 0732  BP: 131/64 131/63  Pulse: 89 88  Resp: 16 20  Temp: 97.9 F (36.6 C) 97.6 F (36.4 C)  SpO2: 96% 96%  Wt Readings from Last 3 Encounters:  09/29/17 76.5 kg (168 lb 10.4 oz)  09/25/17 73.3 kg (161 lb 9.6 oz)  09/15/17 77.1 kg (170 lb)    General: sleeping quietly.  Awakens easily.  Looks exhausted.  Pale, ashen coloring. Head: No facial asymmetry.  No signs of head trauma. Eyes: No scleral icterus.  No conjunctival pallor. Ears:  Not hard of hearing Nose: No discharge. Mouth: Moist, pink, clear oral MM.  Tongue midline Neck: No mass, no TMG, no JVD Lungs: No labored breathing.  Lungs clear bilaterally Heart: RRR.  No MRG.  S1, S2 present. Abdomen: Hypoactive bowel sounds, non-or tingling or tympanitic.  Tenderness diffusely but no guarding or rebound.  No bruits.  No masses.  No HSM.[name]   Rectal: Deferred Musc/Skeltl: No gross joint deformities or swelling. Extremities: Non-pitting, mild, ankle/pedal edema bilaterally.  Feet are warm with good capillary refill Neurologic: Alert.  Oriented x3.  Moves all 4 limbs.  No tremor.  Limb strength not tested. Skin: No rash, no sores, no suspicious lesions. Tattoos: None   Psych: Flat affect consistent with his illness  Intake/Output from previous day: 01/17 0701 - 01/18 0700 In: 1617.5 [P.O.:700; I.V.:617.5; IV Piggyback:300] Out: 2 [Urine:1; Stool:1] Intake/Output this shift: No intake/output data recorded.  LAB RESULTS: Recent Labs    09/27/17 0409  WBC 3.4*  HGB 10.1*  HCT 29.8*  PLT 103*   BMET Lab Results  Component Value Date   NA 129 (L) 09/29/2017   NA 130 (L) 09/28/2017   NA 128 (L) 09/27/2017   K 3.9 09/29/2017   K 3.3 (L) 09/28/2017   K 3.7 09/27/2017   CL 101 09/29/2017   CL 102 09/28/2017   CL 102 09/27/2017   CO2 22 09/29/2017   CO2 20 (L) 09/28/2017   CO2 20 (L) 09/27/2017   GLUCOSE 167 (H) 09/29/2017   GLUCOSE 124 (H) 09/28/2017   GLUCOSE 127 (H) 09/27/2017   BUN 16 09/29/2017   BUN 15 09/28/2017   BUN 18 09/27/2017   CREATININE 0.85 09/29/2017   CREATININE 0.94 09/28/2017   CREATININE 0.95 09/27/2017   CALCIUM 7.8 (L) 09/29/2017   CALCIUM 7.9 (L) 09/28/2017   CALCIUM 7.8 (L) 09/27/2017   LFT Recent Labs    09/28/17 0500  PROT 4.5*  ALBUMIN 2.1*  AST 11*  ALT 16*  ALKPHOS 58  BILITOT 1.0   PT/INR Lab Results  Component Value Date   INR 1.03 08/17/2017   INR 0.98 06/26/2017   Hepatitis Panel No results for  input(s): HEPBSAG, HCVAB, HEPAIGM, HEPBIGM in the last 72 hours. C-Diff No components found for: CDIFF Lipase     Component Value Date/Time   LIPASE <10 (L) 09/26/2017 0943    Drugs of Abuse  No results found for: LABOPIA, COCAINSCRNUR, LABBENZ, AMPHETMU, THCU, LABBARB   RADIOLOGY STUDIES: Ct Abdomen Pelvis W Contrast  Result Date: 09/28/2017 CLINICAL DATA:  Abdominal pain.  History of pancreas cancer. EXAM: CT ABDOMEN AND PELVIS WITH CONTRAST TECHNIQUE: Multidetector CT imaging of the abdomen and pelvis was performed using the standard protocol following bolus administration of intravenous contrast. CONTRAST:  139m ISOVUE-300 IOPAMIDOL (ISOVUE-300) INJECTION 61% COMPARISON:  07/20/2017 FINDINGS: Lower chest: No acute abnormality. Hepatobiliary: No suspicious liver abnormality. The gallbladder appears normal. Pancreas: The ill defined mass described previously in the head neck region of the pancreas is again noted abutting the portal venous confluence, image 55 of series 7. 2.9 x 2.2 cm, image 55 of series 7. Previously 3.1  x 3.7 cm. There is dilatation of the main duct with atrophy of the pancreatic parenchyma in the body and tail. Spleen: Normal in size without focal abnormality. Adrenals/Urinary Tract: Small cysts in the right kidney noted. No mass or hydronephrosis. Heterogeneously enhancing mass involving the upper pole of the left kidney is again identified measuring approximately 6.8 cm, image 80 of series 9. Similar to MRI from 07/27/2017. The left renal vein appears patent. There is no hydronephrosis. Urinary bladder is normal. Stomach/Bowel: Stomach is unremarkable. Abnormal increase caliber of the proximal and mid small bowel loops with air-fluid levels identified measuring up to 4.2 cm. Distal small bowel loops are decreased in caliber and demonstrate abnormal bowel wall edema/thickening measuring up to 9 mm. No significant bowel wall thickening of the: No evidence for perforation. No  abscess. No pneumatosis identified. Extensive distal colonic diverticulosis without acute diverticulitis. Vascular/Lymphatic: Aortic atherosclerosis. Infrarenal abdominal aorta is increased in caliber measuring up to 2.6 cm, image 123 of series 9. 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 no upper abdominal adenopathy. No pelvic or inguinal adenopathy. Reproductive: Prostate is unremarkable. Other: There is a small to moderate volume of ascites within the abdomen and pelvis. No discrete fluid collections identified. Musculoskeletal: The bones appear diffusely osteopenic. Stable chronic compression fracture at L2. No suspicious bone lesions. IMPRESSION: 1. Wall thickening and inflammation involving distal small bowel loops identified. Findings compatible with acute enteritis which may be inflammatory or infectious in etiology. Ischemic enteritis is considered less favored although there is extensive atherosclerotic disease involving the abdomen and pelvis, the superior mesenteric artery and its branches appear patent. 2. Pathologic dilatation of the proximal and mid small bowel loops compatible with bowel obstruction. 3. Head of pancreas mass involving the portal venous confluence is again noted with associated main duct dilatation and atrophy of the body and tail of pancreas. Mass is stable to slightly decreased in size from previous exam. No specific findings identified to suggest metastatic disease. 4. Heterogeneous lead enhancing mass involving the upper pole of left kidney compatible with renal cell carcinoma. 5.  Aortic Atherosclerosis (ICD10-I70.0). 6. Ectatic abdominal aorta at risk for aneurysm development. Recommend followup by ultrasound in 5 years. This recommendation follows ACR consensus guidelines: White Paper of the ACR Incidental Findings Committee II on Vascular Findings. J Am Coll Radiol  2013; 10:789-794. 7. Aneurysmal dilatation of the right common iliac artery and focal thrombosed aneurysm is noted involving the right internal iliac artery. Electronically Signed   By: Kerby Moors M.D.   On: 09/28/2017 16:32     IMPRESSION:   *     Chemo induced distal enteritis in patient nearly 2 weeks post 09/16/17 cycle 1 FOLFIRINOX.  *   Pancreatic and renal cell cancers.    *     Pancytopenia.  Likely due to chemotherapy S/E.    *      CAD, [place].  2 stents 11/2016.  Chronic Plavix since then.    *      Aortic valve stenosis.  S/p TAVR 08/22/17.    *     Protein calorie malnutrition.     PLAN:     *    Agree with cipro/flagyl, IVF, supportive care.  Lomotil now discontinued.    *  Diet as tolerated.  ? Seek RD, nutritional eval and recommendations?    Azucena Freed  09/29/2017, 12:18 PM  Pager: [phone]   Attending physician's note   I have taken a history, examined the patient and reviewed the chart. I agree with the Advanced Practitioner's note, impression and recommendations. 72 year old male with pancreatic and renal cell cancer recently diagnosed in November 2018.  Status post TAVR December 2018.  He completed his first cycle of chemo, Folfirinox.  Scented with nausea, vomiting, abdominal pain and diarrhea.  CT abdomen and pelvis suggestive of enteritis.  He has pancytopenia including leukopenia.  Nausea and vomiting has improved, stool frequency also has decreased since he started antibiotics.  He only had 2 episodes since this morning today. The enteritis possibly secondary to chemotherapy versus infectious.  No evidence of bowel obstruction based on imaging Continue supportive care Continue antibiotics Advance diet slowly as tolerated We will hold off endoscopic or colonoscopic evaluation given significant leukopenia and less likely to alter the management We will be available, please call if have any questions or concerns   K Denzil Magnuson, MD [phone] Mon-Fri  8a-5p [phone] after 5p, weekends, holidays

## 2017-09-30 ENCOUNTER — Inpatient Hospital Stay (HOSPITAL_COMMUNITY): Payer: Medicare Other

## 2017-09-30 LAB — COMPREHENSIVE METABOLIC PANEL
ALT: 18 U/L (ref 17–63)
AST: 17 U/L (ref 15–41)
Albumin: 2.1 g/dL — ABNORMAL LOW (ref 3.5–5.0)
Alkaline Phosphatase: 58 U/L (ref 38–126)
Anion gap: 6 (ref 5–15)
BUN: 16 mg/dL (ref 6–20)
CO2: 22 mmol/L (ref 22–32)
Calcium: 7.8 mg/dL — ABNORMAL LOW (ref 8.9–10.3)
Chloride: 103 mmol/L (ref 101–111)
Creatinine, Ser: 0.83 mg/dL (ref 0.61–1.24)
GFR calc Af Amer: >60 mL/min (ref >60)
GFR calc non Af Amer: >60 mL/min (ref >60)
Glucose, Bld: 153 mg/dL — ABNORMAL HIGH (ref 65–99)
Potassium: 3.4 mmol/L — ABNORMAL LOW (ref 3.5–5.1)
Sodium: 131 mmol/L — ABNORMAL LOW (ref 135–145)
Total Bilirubin: 0.7 mg/dL (ref 0.3–1.2)
Total Protein: 4.7 g/dL — ABNORMAL LOW (ref 6.5–8.1)

## 2017-09-30 LAB — CBC WITH DIFFERENTIAL/PLATELET
Basophils Absolute: 0 10*3/uL (ref 0.0–0.1)
Basophils Relative: 0 %
Eosinophils Absolute: 0.1 10*3/uL (ref 0.0–0.7)
Eosinophils Relative: 1 %
HCT: 30.4 % — ABNORMAL LOW (ref 39.0–52.0)
Hemoglobin: 10.1 g/dL — ABNORMAL LOW (ref 13.0–17.0)
Lymphocytes Relative: 14 %
Lymphs Abs: 0.9 10*3/uL (ref 0.7–4.0)
MCH: 29.4 pg (ref 26.0–34.0)
MCHC: 33.2 g/dL (ref 30.0–36.0)
MCV: 88.6 fL (ref 78.0–100.0)
Monocytes Absolute: 2 10*3/uL — ABNORMAL HIGH (ref 0.1–1.0)
Monocytes Relative: 31 %
Neutro Abs: 3.3 10*3/uL (ref 1.7–7.7)
Neutrophils Relative %: 54 %
Platelets: 204 10*3/uL (ref 150–400)
RBC: 3.43 MIL/uL — ABNORMAL LOW (ref 4.22–5.81)
RDW: 14.1 % (ref 11.5–15.5)
WBC: 6.3 10*3/uL (ref 4.0–10.5)

## 2017-09-30 LAB — MAGNESIUM: Magnesium: 1.8 mg/dL (ref 1.7–2.4)

## 2017-09-30 MED ORDER — MORPHINE SULFATE (PF) 4 MG/ML IV SOLN
4.0000 mg | Freq: Once | INTRAVENOUS | Status: DC
Start: 2017-09-30 — End: 2017-10-02

## 2017-09-30 MED ORDER — PROCHLORPERAZINE MALEATE 10 MG PO TABS: 5.0000 mg | ORAL_TABLET | Freq: Four times a day (QID) | ORAL | Status: DC | PRN

## 2017-09-30 MED ORDER — FAMOTIDINE 20 MG PO TABS
20.0000 mg | ORAL_TABLET | Freq: Every day | ORAL | Status: DC
  Administered 2017-10-01 – 2017-10-02 (×3): 20 mg via ORAL
  Filled 2017-09-30 (×3): qty 1

## 2017-09-30 MED ORDER — POTASSIUM CHLORIDE 10 MEQ/100ML IV SOLN
10.0000 meq | INTRAVENOUS | Status: AC
  Administered 2017-09-30 – 2017-10-01 (×4): 10 meq via INTRAVENOUS
  Filled 2017-09-30 (×4): qty 100

## 2017-09-30 MED ORDER — POTASSIUM CHLORIDE CRYS ER 20 MEQ PO TBCR
40.0000 meq | EXTENDED_RELEASE_TABLET | Freq: Two times a day (BID) | ORAL | Status: DC
  Administered 2017-09-30: 40 meq via ORAL
  Filled 2017-09-30: qty 2

## 2017-09-30 MED ORDER — GI COCKTAIL ~~LOC~~
30.0000 mL | Freq: Once | ORAL | Status: DC
  Filled 2017-09-30: qty 30

## 2017-09-30 NOTE — Progress Notes (Signed)
[facility] PROGRESS NOTE    Progress Note  Manuel Olson  FMB:[id number] DOB: 1946/05/28 DOA: 09/26/2017 PCP: Renee Rival, NP     Brief Narrative:   Manuel Olson is an 72 y.o. male past medical history significant for pancreatic cancer on chemotherapy, status post aortic valve replacement comes in for 1 week of nausea and vomiting not eating well he has seen his oncologist several times has received IV fluids and is felt better but comes in today with intractable nausea and vomiting.  Assessment/Plan:   Dehydration: Cont. IV fluid hydration.  Hyponatremia: Likely due to hypovolemia, his hyponatremia is improving.  Continue l IV fluids recheck basic metabolic panel in the morning.  New onset of fevers/abdominal pain due to terminal ileitis: He is now afebrile, C. difficile PCR is negative. CT scan of the abdomen and pelvis he relates he is having new abdominal pain.  Has no peritoneal signs, no rebound or guarding. Feels much better compared to yesterday, continue IV ciprofloxacin and Flagyl. Will anorexic, no bowel movements today 4 on 09/29/2017.  Severe aortic stenosis/S/P TAVR (transcatheter aortic valve replacement) Stable.  Cancer of head of pancreas [facility]) Oncology following recommended to hold chemotherapy for now appreciate assistance.  Essential hypertension, benign Hold antihypertensive medication.  CAD (coronary artery disease) Stable continue Lipitor,aspirin and Plavix.  Thrombocytopenia: Is improving slowly.  [place] due to chemotherapy.  Antineoplastic induced anemia: Remained stable continue to monitor.   DVT prophylaxis: lovenox Family Communication:wife Disposition Plan/Barrier to D/C: once able to tolerate orals. Code Status:     Code Status Orders  (From admission, onward)        Start     Ordered   09/26/17 1752  Full code  Continuous     09/26/17 1751    Code Status History    Date Active Date Inactive Code  Status Order ID Comments User Context   08/22/2017 18:05 08/24/2017 13:51 Full Code [id number]  Gaye Pollack, MD Inpatient   07/05/2017 09:25 07/05/2017 15:34 Full Code [id number]  [name], MD Inpatient    Advance Directive Documentation     Most Recent Value  Type of Advance Directive  Healthcare Power of Attorney, Living will  Pre-existing out of facility DNR order (yellow form or pink MOST form)  No data  "MOST" Form in Place?  No data        IV Access:    Peripheral IV   Procedures and diagnostic studies:   Ct Abdomen Pelvis W Contrast  Result Date: 09/28/2017 CLINICAL DATA:  Abdominal pain.  History of pancreas cancer. EXAM: CT ABDOMEN AND PELVIS WITH CONTRAST TECHNIQUE: Multidetector CT imaging of the abdomen and pelvis was performed using the standard protocol following bolus administration of intravenous contrast. CONTRAST:  127m ISOVUE-300 IOPAMIDOL (ISOVUE-300) INJECTION 61% COMPARISON:  07/20/2017 FINDINGS: Lower chest: No acute abnormality. Hepatobiliary: No suspicious liver abnormality. The gallbladder appears normal. Pancreas: The ill defined mass described previously in the head neck region of the pancreas is again noted abutting the portal venous confluence, image 55 of series 7. 2.9 x 2.2 cm, image 55 of series 7. Previously 3.1 x 3.7 cm. There is dilatation of the main duct with atrophy of the pancreatic parenchyma in the body and tail. Spleen: Normal in size without focal abnormality. Adrenals/Urinary Tract: Small cysts in the right kidney noted. No mass or hydronephrosis. Heterogeneously enhancing mass involving the upper pole of the left kidney is again identified measuring approximately 6.8 cm, image 80 of series  9. Similar to MRI from 07/27/2017. The left renal vein appears patent. There is no hydronephrosis. Urinary bladder is normal. Stomach/Bowel: Stomach is unremarkable. Abnormal increase caliber of the proximal and mid small bowel loops with air-fluid  levels identified measuring up to 4.2 cm. Distal small bowel loops are decreased in caliber and demonstrate abnormal bowel wall edema/thickening measuring up to 9 mm. No significant bowel wall thickening of the: No evidence for perforation. No abscess. No pneumatosis identified. Extensive distal colonic diverticulosis without acute diverticulitis. Vascular/Lymphatic: Aortic atherosclerosis. Infrarenal abdominal aorta is increased in caliber measuring up to 2.6 cm, image 123 of series 9. 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 no upper abdominal adenopathy. No pelvic or inguinal adenopathy. Reproductive: Prostate is unremarkable. Other: There is a small to moderate volume of ascites within the abdomen and pelvis. No discrete fluid collections identified. Musculoskeletal: The bones appear diffusely osteopenic. Stable chronic compression fracture at L2. No suspicious bone lesions. IMPRESSION: 1. Wall thickening and inflammation involving distal small bowel loops identified. Findings compatible with acute enteritis which may be inflammatory or infectious in etiology. Ischemic enteritis is considered less favored although there is extensive atherosclerotic disease involving the abdomen and pelvis, the superior mesenteric artery and its branches appear patent. 2. Pathologic dilatation of the proximal and mid small bowel loops compatible with bowel obstruction. 3. Head of pancreas mass involving the portal venous confluence is again noted with associated main duct dilatation and atrophy of the body and tail of pancreas. Mass is stable to slightly decreased in size from previous exam. No specific findings identified to suggest metastatic disease. 4. Heterogeneous lead enhancing mass involving the upper pole of left kidney compatible with renal cell carcinoma. 5.  Aortic Atherosclerosis (ICD10-I70.0). 6. Ectatic  abdominal aorta at risk for aneurysm development. Recommend followup by ultrasound in 5 years. This recommendation follows ACR consensus guidelines: White Paper of the ACR Incidental Findings Committee II on Vascular Findings. J Am Coll Radiol 2013; 10:789-794. 7. Aneurysmal dilatation of the right common iliac artery and focal thrombosed aneurysm is noted involving the right internal iliac artery. Electronically Signed   By: Kerby Moors M.D.   On: 09/28/2017 16:32     Medical Consultants:    None.  Anti-Infectives:   None  Subjective:    Manuel Olson abdominal pain significantly better, he would like a full liquid diet..  Objective:    Vitals:   09/29/17 0732 09/29/17 1504 09/29/17 2013 09/30/17 0422  BP: 131/63 (!) 145/71 129/62 (!) 115/59  Pulse: 88 85 86 81  Resp: 20 18 16 14   Temp: 97.6 F (36.4 C) 98.2 F (36.8 C) 98.4 F (36.9 C) 98.4 F (36.9 C)  TempSrc: Oral Oral Oral Oral  SpO2: 96% 95% 96% 97%  Weight: 76.5 kg (168 lb 10.4 oz)   76.5 kg (168 lb 10.4 oz)  Height:        Intake/Output Summary (Last 24 hours) at 09/30/2017 0859 Last data filed at 09/30/2017 0423 Gross per 24 hour  Intake 370 ml  Output 3 ml  Net 367 ml   Filed Weights   09/27/17 0548 09/29/17 0732 09/30/17 0422  Weight: 78.8 kg (173 lb 11.6 oz) 76.5 kg (168 lb 10.4 oz) 76.5 kg (168 lb 10.4 oz)    Exam: General exam: In no acute distress. Respiratory system: Good air movement and clear to auscultation. Cardiovascular system:  S1 & S2 heard, RRR. No JVD. Gastrointestinal system: Active bowel sounds, soft diffuse tenderness no rebound or guarding. Central nervous system: Alert and oriented. No focal neurological deficits. Extremities: No pedal edema. Skin: No rashes, lesions or ulcers Psychiatry: Judgement and insight appear normal. Mood & affect appropriate.    Data Reviewed:    Labs: Basic Metabolic Panel: Recent Labs  Lab 09/26/17 0943 09/27/17 0409 09/28/17 0500  09/29/17 1114 09/30/17 0446  NA 129* 128* 130* 129* 131*  K 3.3* 3.7 3.3* 3.9 3.4*  CL 99* 102 102 101 103  CO2 21* 20* 20* 22 22  GLUCOSE 161* 127* 124* 167* 153*  BUN 24* 18 15 16 16   CREATININE 1.05 0.95 0.94 0.85 0.83  CALCIUM 8.1* 7.8* 7.9* 7.8* 7.8*  MG 1.5*  --  1.7  --  1.8   GFR Estimated Creatinine Clearance: 80.6 mL/min (by C-G formula based on SCr of 0.83 mg/dL). Liver Function Tests: Recent Labs  Lab 09/23/17 1401 09/25/17 1301 09/26/17 0943 09/28/17 0500 09/30/17 0446  AST 13* 12 16 11* 17  ALT 11* 26 21 16* 18  ALKPHOS 119 87 65 58 58  BILITOT 1.0 0.3 0.3 1.0 0.7  PROT 5.9* 5.5* 5.1* 4.5* 4.7*  ALBUMIN 3.3* 2.8* 2.6* 2.1* 2.1*   Recent Labs  Lab 09/25/17 1301 09/26/17 0943 09/29/17 1114  LIPASE <10* <10* 19  AMYLASE 15*  --   --    No results for input(s): AMMONIA in the last 168 hours. Coagulation profile No results for input(s): INR, PROTIME in the last 168 hours.  CBC: Recent Labs  Lab 09/23/17 1401 09/25/17 1301 09/26/17 0943 09/27/17 0409 09/30/17 0446  WBC 14.9* 3.1* 2.9* 3.4* 6.3  NEUTROABS 13.2* 1.7 1.2* 1.8 3.3  HGB 12.9*  --  10.3* 10.1* 10.1*  HCT 37.6* 34.4* 30.1* 29.8* 30.4*  MCV 89.1 90.2 88.8 88.4 88.6  PLT 78* 89* 86* 103* 204   Cardiac Enzymes: No results for input(s): CKTOTAL, CKMB, CKMBINDEX, TROPONINI in the last 168 hours. BNP (last 3 results) No results for input(s): PROBNP in the last 8760 hours. CBG: No results for input(s): GLUCAP in the last 168 hours. D-Dimer: No results for input(s): DDIMER in the last 72 hours. Hgb A1c: No results for input(s): HGBA1C in the last 72 hours. Lipid Profile: No results for input(s): CHOL, HDL, LDLCALC, TRIG, CHOLHDL, LDLDIRECT in the last 72 hours. Thyroid function studies: No results for input(s): TSH, T4TOTAL, T3FREE, THYROIDAB in the last 72 hours.  Invalid input(s): FREET3 Anemia work up: No results for input(s): VITAMINB12, FOLATE, FERRITIN, TIBC, IRON, RETICCTPCT  in the last 72 hours. Sepsis Labs: Recent Labs  Lab 09/25/17 1301 09/26/17 0943 09/27/17 0409 09/30/17 0446  WBC 3.1* 2.9* 3.4* 6.3   Microbiology Recent Results (from the past 240 hour(s))  Respiratory Panel by PCR     Status: None   Collection Time: 09/27/17 10:07 AM  Result Value Ref Range Status   Adenovirus NOT DETECTED NOT DETECTED Final   Coronavirus 229E NOT DETECTED NOT DETECTED Final   Coronavirus HKU1 NOT DETECTED NOT DETECTED Final   Coronavirus NL63 NOT DETECTED NOT DETECTED Final   Coronavirus OC43 NOT DETECTED NOT DETECTED Final   Metapneumovirus NOT DETECTED NOT DETECTED Final   Rhinovirus / Enterovirus NOT DETECTED NOT DETECTED Final   Influenza A NOT DETECTED NOT DETECTED Final   Influenza B NOT DETECTED NOT DETECTED Final   Parainfluenza Virus 1 NOT DETECTED NOT DETECTED Final   Parainfluenza Virus 2  NOT DETECTED NOT DETECTED Final   Parainfluenza Virus 3 NOT DETECTED NOT DETECTED Final   Parainfluenza Virus 4 NOT DETECTED NOT DETECTED Final   Respiratory Syncytial Virus NOT DETECTED NOT DETECTED Final   Bordetella pertussis NOT DETECTED NOT DETECTED Final   Chlamydophila pneumoniae NOT DETECTED NOT DETECTED Final   Mycoplasma pneumoniae NOT DETECTED NOT DETECTED Final  C difficile quick scan w PCR reflex     Status: None   Collection Time: 09/27/17  2:27 PM  Result Value Ref Range Status   C Diff antigen NEGATIVE NEGATIVE Final   C Diff toxin NEGATIVE NEGATIVE Final   C Diff interpretation No C. difficile detected.  Final     Medications:   . aspirin EC  81 mg Oral Daily  . atorvastatin  20 mg Oral Daily  . clopidogrel  75 mg Oral Daily  . lisinopril  10 mg Oral Daily  . magnesium oxide  400 mg Oral BID  . protein supplement  1 scoop Oral TID WC  . protein supplement  8 oz Oral Q24H   Continuous Infusions: . sodium chloride 100 mL/hr at 09/29/17 2239  . ciprofloxacin 400 mg (09/30/17 0602)  . metronidazole Stopped (09/30/17 0325)     LOS: 3  days   [facility] Pager [phone]  *Please refer to [email or web address], password [email or web address] to get updated schedule on who will round on this patient, as hospitalists switch teams weekly. If 7PM-7AM, please contact night-coverage at [email or web address], password [email or web address] for any overnight needs.  09/30/2017, 8:59 AM

## 2017-10-01 DIAGNOSIS — K50018 Crohn's disease of small intestine with other complication: Secondary | ICD-10-CM

## 2017-10-01 DIAGNOSIS — R140 Abdominal distension (gaseous): Secondary | ICD-10-CM

## 2017-10-01 DIAGNOSIS — R109 Unspecified abdominal pain: Secondary | ICD-10-CM

## 2017-10-01 LAB — BASIC METABOLIC PANEL
Anion gap: 7 (ref 5–15)
BUN: 12 mg/dL (ref 6–20)
CO2: 23 mmol/L (ref 22–32)
Calcium: 7.7 mg/dL — ABNORMAL LOW (ref 8.9–10.3)
Chloride: 99 mmol/L — ABNORMAL LOW (ref 101–111)
Creatinine, Ser: 0.8 mg/dL (ref 0.61–1.24)
GFR calc Af Amer: >60 mL/min (ref >60)
GFR calc non Af Amer: >60 mL/min (ref >60)
Glucose, Bld: 169 mg/dL — ABNORMAL HIGH (ref 65–99)
Potassium: 3.5 mmol/L (ref 3.5–5.1)
Sodium: 129 mmol/L — ABNORMAL LOW (ref 135–145)

## 2017-10-01 MED ORDER — SODIUM CHLORIDE 0.9 % IV SOLN: INTRAVENOUS | Status: AC

## 2017-10-01 NOTE — Progress Notes (Signed)
[facility] PROGRESS NOTE    Progress Note  Manuel Olson  WJX:[id number] DOB: 1946-06-15 DOA: 09/26/2017 PCP: Renee Rival, NP     Brief Narrative:   Manuel Olson is an 72 y.o. male past medical history significant for pancreatic cancer on chemotherapy, status post aortic valve replacement comes in for 1 week of nausea and vomiting not eating well he has seen his oncologist several times has received IV fluids and is felt better but comes in today with intractable nausea and vomiting.  Assessment/Plan:   Dehydration: Resolved.  Hyponatremia: Likely due to hypovolemia, his hyponatremia is improving.  Continue l IV fluids recheck basic metabolic panel in the morning.  New onset of fevers/abdominal pain due to terminal ileitis: He is now afebrile, C. difficile PCR is negative. CT scan of the abdomen and pelvis showed significant inflammation of the terminal ileum Feels much better, continue IV ciprofloxacin and Flagyl, tolerating diet having multiple bowel movements.  Severe aortic stenosis/S/P TAVR (transcatheter aortic valve replacement) Stable.  Cancer of head of pancreas [facility]) Oncology following recommended to hold chemotherapy for now appreciate assistance.  Essential hypertension, benign Hold antihypertensive medication.  CAD (coronary artery disease) Stable continue Lipitor,aspirin and Plavix.  Thrombocytopenia: Is improving slowly.  Due to chemotherapy.  Antineoplastic induced anemia: Remained stable continue to monitor.   DVT prophylaxis: lovenox Family Communication:wife Disposition Plan/Barrier to D/C: once able to tolerate orals. Code Status:     Code Status Orders  (From admission, onward)        Start     Ordered   09/26/17 1752  Full code  Continuous     09/26/17 1751    Code Status History    Date Active Date Inactive Code Status Order ID Comments User Context   08/22/2017 18:05 08/24/2017 13:51 Full Code [id number]   Gaye Pollack, MD Inpatient   07/05/2017 09:25 07/05/2017 15:34 Full Code [id number]  [name], MD Inpatient    Advance Directive Documentation     Most Recent Value  Type of Advance Directive  Healthcare Power of Attorney, Living will  Pre-existing out of facility DNR order (yellow form or pink MOST form)  No data  "MOST" Form in Place?  No data        IV Access:    Peripheral IV   Procedures and diagnostic studies:   Dg Abd 2 Views  Result Date: 09/30/2017 CLINICAL DATA:  Abdominal pain and distention. EXAM: ABDOMEN - 2 VIEW COMPARISON:  CT scan September 28, 2017 FINDINGS: No free air seen on the right side up decubitus film. A small bowel obstruction is identified. Hernia mesh is seen in the lower pelvis. No other acute abnormalities. IMPRESSION: Continued small bowel obstruction.  No free air identified. Electronically Signed   By: Dorise Bullion III M.D   On: 09/30/2017 23:40     Medical Consultants:    None.  Anti-Infectives:   None  Subjective:    Georgina Pillion Werntz dominant pain is significantly better, will advance diet to regular.  Objective:    Vitals:   09/30/17 0422 09/30/17 1300 09/30/17 2026 10/01/17 0417  BP: (!) 115/59 (!) 151/67 (!) 141/58 139/63  Pulse: 81 79 83 82  Resp: 14 16 12 13   Temp: 98.4 F (36.9 C) 97.9 F (36.6 C) 98.4 F (36.9 C) 97.9 F (36.6 C)  TempSrc: Oral Oral Oral Oral  SpO2: 97% 96% 96% 95%  Weight: 76.5 kg (168 lb 10.4 oz)   76.5 kg (168  lb 10.4 oz)  Height:        Intake/Output Summary (Last 24 hours) at 10/01/2017 0844 Last data filed at 09/30/2017 2027 Gross per 24 hour  Intake 1100 ml  Output 3 ml  Net 1097 ml   Filed Weights   09/29/17 0732 09/30/17 0422 10/01/17 0417  Weight: 76.5 kg (168 lb 10.4 oz) 76.5 kg (168 lb 10.4 oz) 76.5 kg (168 lb 10.4 oz)    Exam: General exam: In no acute distress. Respiratory system: Good air movement and clear to auscultation. Cardiovascular system: S1 & S2  heard, RRR. No JVD. Gastrointestinal system: Positive bowel sounds soft nontender nondistended Central nervous system: Alert and oriented. No focal neurological deficits. Extremities: No pedal edema. Skin: No rashes, lesions or ulcers Psychiatry: Judgement and insight appear normal. Mood & affect appropriate.    Data Reviewed:    Labs: Basic Metabolic Panel: Recent Labs  Lab 09/26/17 0943 09/27/17 0409 09/28/17 0500 09/29/17 1114 09/30/17 0446 10/01/17 0450  NA 129* 128* 130* 129* 131* 129*  K 3.3* 3.7 3.3* 3.9 3.4* 3.5  CL 99* 102 102 101 103 99*  CO2 21* 20* 20* 22 22 23   GLUCOSE 161* 127* 124* 167* 153* 169*  BUN 24* 18 15 16 16 12   CREATININE 1.05 0.95 0.94 0.85 0.83 0.80  CALCIUM 8.1* 7.8* 7.9* 7.8* 7.8* 7.7*  MG 1.5*  --  1.7  --  1.8  --    GFR Estimated Creatinine Clearance: 83.6 mL/min (by C-G formula based on SCr of 0.8 mg/dL). Liver Function Tests: Recent Labs  Lab 09/25/17 1301 09/26/17 0943 09/28/17 0500 09/30/17 0446  AST 12 16 11* 17  ALT 26 21 16* 18  ALKPHOS 87 65 58 58  BILITOT 0.3 0.3 1.0 0.7  PROT 5.5* 5.1* 4.5* 4.7*  ALBUMIN 2.8* 2.6* 2.1* 2.1*   Recent Labs  Lab 09/25/17 1301 09/26/17 0943 09/29/17 1114  LIPASE <10* <10* 19  AMYLASE 15*  --   --    No results for input(s): AMMONIA in the last 168 hours. Coagulation profile No results for input(s): INR, PROTIME in the last 168 hours.  CBC: Recent Labs  Lab 09/25/17 1301 09/26/17 0943 09/27/17 0409 09/30/17 0446  WBC 3.1* 2.9* 3.4* 6.3  NEUTROABS 1.7 1.2* 1.8 3.3  HGB  --  10.3* 10.1* 10.1*  HCT 34.4* 30.1* 29.8* 30.4*  MCV 90.2 88.8 88.4 88.6  PLT 89* 86* 103* 204   Cardiac Enzymes: No results for input(s): CKTOTAL, CKMB, CKMBINDEX, TROPONINI in the last 168 hours. BNP (last 3 results) No results for input(s): PROBNP in the last 8760 hours. CBG: No results for input(s): GLUCAP in the last 168 hours. D-Dimer: No results for input(s): DDIMER in the last 72 hours. Hgb  A1c: No results for input(s): HGBA1C in the last 72 hours. Lipid Profile: No results for input(s): CHOL, HDL, LDLCALC, TRIG, CHOLHDL, LDLDIRECT in the last 72 hours. Thyroid function studies: No results for input(s): TSH, T4TOTAL, T3FREE, THYROIDAB in the last 72 hours.  Invalid input(s): FREET3 Anemia work up: No results for input(s): VITAMINB12, FOLATE, FERRITIN, TIBC, IRON, RETICCTPCT in the last 72 hours. Sepsis Labs: Recent Labs  Lab 09/25/17 1301 09/26/17 0943 09/27/17 0409 09/30/17 0446  WBC 3.1* 2.9* 3.4* 6.3   Microbiology Recent Results (from the past 240 hour(s))  Respiratory Panel by PCR     Status: None   Collection Time: 09/27/17 10:07 AM  Result Value Ref Range Status   Adenovirus NOT DETECTED NOT DETECTED  Final   Coronavirus 229E NOT DETECTED NOT DETECTED Final   Coronavirus HKU1 NOT DETECTED NOT DETECTED Final   Coronavirus NL63 NOT DETECTED NOT DETECTED Final   Coronavirus OC43 NOT DETECTED NOT DETECTED Final   Metapneumovirus NOT DETECTED NOT DETECTED Final   Rhinovirus / Enterovirus NOT DETECTED NOT DETECTED Final   Influenza A NOT DETECTED NOT DETECTED Final   Influenza B NOT DETECTED NOT DETECTED Final   Parainfluenza Virus 1 NOT DETECTED NOT DETECTED Final   Parainfluenza Virus 2 NOT DETECTED NOT DETECTED Final   Parainfluenza Virus 3 NOT DETECTED NOT DETECTED Final   Parainfluenza Virus 4 NOT DETECTED NOT DETECTED Final   Respiratory Syncytial Virus NOT DETECTED NOT DETECTED Final   Bordetella pertussis NOT DETECTED NOT DETECTED Final   Chlamydophila pneumoniae NOT DETECTED NOT DETECTED Final   Mycoplasma pneumoniae NOT DETECTED NOT DETECTED Final  C difficile quick scan w PCR reflex     Status: None   Collection Time: 09/27/17  2:27 PM  Result Value Ref Range Status   C Diff antigen NEGATIVE NEGATIVE Final   C Diff toxin NEGATIVE NEGATIVE Final   C Diff interpretation No C. difficile detected.  Final     Medications:   . aspirin EC  81 mg  Oral Daily  . atorvastatin  20 mg Oral Daily  . clopidogrel  75 mg Oral Daily  . famotidine  20 mg Oral Daily  . gi cocktail  30 mL Oral Once  . lisinopril  10 mg Oral Daily  . magnesium oxide  400 mg Oral BID  .  morphine injection  4 mg Intravenous Once  . protein supplement  1 scoop Oral TID WC  . protein supplement  8 oz Oral Q24H   Continuous Infusions: . sodium chloride    . ciprofloxacin 400 mg (10/01/17 2637)  . metronidazole Stopped (10/01/17 0252)     LOS: 4 days   Charlynne Cousins  [facility] Pager [phone]  *Please refer to [email or web address], password [email or web address] to get updated schedule on who will round on this patient, as hospitalists switch teams weekly. If 7PM-7AM, please contact night-coverage at [email or web address], password [email or web address] for any overnight needs.  10/01/2017, 8:44 AM

## 2017-10-01 NOTE — Progress Notes (Signed)
Pt has experienced significant increase in abdominal distension over the course of the night as well as pain. 2 view abdomen was ordered consistent with SBO.  Physician on call ordered extra dose of morphine and said to page if nausea should develop. Patient is resting at this time. Will continue to monitor.

## 2017-10-01 NOTE — Progress Notes (Signed)
Distention has gone down as patient had two watery stools.  Baclofen has relieved his pain.

## 2017-10-02 ENCOUNTER — Other Ambulatory Visit: Payer: Medicare Other

## 2017-10-02 ENCOUNTER — Encounter: Payer: Medicare Other | Admitting: Nutrition

## 2017-10-02 ENCOUNTER — Ambulatory Visit: Payer: Medicare Other

## 2017-10-02 ENCOUNTER — Ambulatory Visit: Payer: Medicare Other | Admitting: Oncology

## 2017-10-02 ENCOUNTER — Telehealth: Payer: Self-pay | Admitting: Oncology

## 2017-10-02 DIAGNOSIS — C641 Malignant neoplasm of right kidney, except renal pelvis: Secondary | ICD-10-CM

## 2017-10-02 LAB — BASIC METABOLIC PANEL
Anion gap: 5 (ref 5–15)
BUN: 9 mg/dL (ref 6–20)
CO2: 25 mmol/L (ref 22–32)
Calcium: 7.8 mg/dL — ABNORMAL LOW (ref 8.9–10.3)
Chloride: 102 mmol/L (ref 101–111)
Creatinine, Ser: 0.77 mg/dL (ref 0.61–1.24)
GFR calc Af Amer: >60 mL/min (ref >60)
GFR calc non Af Amer: >60 mL/min (ref >60)
Glucose, Bld: 184 mg/dL — ABNORMAL HIGH (ref 65–99)
Potassium: 3.1 mmol/L — ABNORMAL LOW (ref 3.5–5.1)
Sodium: 132 mmol/L — ABNORMAL LOW (ref 135–145)

## 2017-10-02 MED ORDER — POTASSIUM CHLORIDE CRYS ER 20 MEQ PO TBCR
40.0000 meq | EXTENDED_RELEASE_TABLET | Freq: Two times a day (BID) | ORAL | Status: DC
  Administered 2017-10-02: 40 meq via ORAL
  Filled 2017-10-02: qty 2

## 2017-10-02 MED ORDER — HEPARIN SOD (PORK) LOCK FLUSH 100 UNIT/ML IV SOLN: 500.0000 [IU] | INTRAVENOUS | Status: DC | PRN

## 2017-10-02 MED ORDER — METRONIDAZOLE 500 MG PO TABS: 500.0000 mg | ORAL_TABLET | Freq: Three times a day (TID) | ORAL | 0 refills | Status: DC

## 2017-10-02 MED ORDER — CIPROFLOXACIN HCL 500 MG PO TABS: 500.0000 mg | ORAL_TABLET | Freq: Two times a day (BID) | ORAL | 0 refills | Status: DC

## 2017-10-02 MED ORDER — HEPARIN SOD (PORK) LOCK FLUSH 100 UNIT/ML IV SOLN
500.0000 [IU] | INTRAVENOUS | Status: DC | PRN
  Filled 2017-10-02: qty 5

## 2017-10-02 NOTE — Progress Notes (Addendum)
IP PROGRESS NOTE  Subjective:   Manuel Olson feels much better.  He continues to have diarrhea.  The abdominal pain and nausea are improved.  Objective: Vital signs in last 24 hours: Blood pressure (!) 155/79, pulse 79, temperature 98.3 F (36.8 C), temperature source Oral, resp. rate 18, height 5' 10"  (1.778 m), weight 169 lb 12.8 oz (77 kg), SpO2 96 %.  Intake/Output from previous day: 01/20 0701 - 01/21 0700 In: 1791.3 [P.O.:680; I.V.:211.3; IV Piggyback:900] Out: -   Physical Exam:  HEENT: No thrush or ulcers  Lungs: Clear bilaterally Cardiac: Regular rate and rhythm Abdomen: Soft, nontender Extremities: No leg edema   Portacath/PICC-without erythema  Lab Results: Recent Labs    09/30/17 0446  WBC 6.3  HGB 10.1*  HCT 30.4*  PLT 204   ANC 1.8 BMET Recent Labs    10/01/17 0450 10/02/17 0640  NA 129* 132*  K 3.5 3.1*  CL 99* 102  CO2 23 25  GLUCOSE 169* 184*  BUN 12 9  CREATININE 0.80 0.77  CALCIUM 7.7* 7.8*    No results found for: CEA1  Studies/Results: Dg Abd 2 Views  Result Date: 09/30/2017 CLINICAL DATA:  Abdominal pain and distention. EXAM: ABDOMEN - 2 VIEW COMPARISON:  CT scan September 28, 2017 FINDINGS: No free air seen on the right side up decubitus film. A small bowel obstruction is identified. Hernia mesh is seen in the lower pelvis. No other acute abnormalities. IMPRESSION: Continued small bowel obstruction.  No free air identified. Electronically Signed   By: Dorise Bullion III M.D   On: 09/30/2017 23:40    Medications: I have reviewed the patient's current medications.  Assessment/Plan: 1. Adenocarcinoma of the pancreas head/neck, status post an EUS biopsy 08/02/2017 confirming adenocarcinoma ? EUS 08/02/2017-pancreas head/neck mass, abutment of the portal vein, no peripancreatic adenopathy ? MRI abdomen 07/27/2017- head/neck pancreas mass with mass-effect on the portal splenic confluence, no involvement of the celiac axis or superior  mesenteric artery, no evidence of metastatic disease ? PET scan 08/31/2017-very hypermetabolism at the pancreatic head mass, no evidence of metastatic disease. The isolated right lower lobe nodule below PET resolution, left sided renal mass with decreased activity relative to the normal adjacent kidney ? Cycle 1 FOLFIRINOX 09/16/2017  2. 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   Port-A-Cath placement 09/15/2017, Dr. Barry Dienes  9.   Nausea, diarrhea, and abdominal pain- chemotherapy enteritis versus bowel obstruction versus ischemic enteritis  10.  Dehydration secondary to nausea and diarrhea-resolved  11.  Pancytopenia secondary to chemotherapy- improved  Manuel Olson appears much improved this morning.  I suspect the clinical presentation is related to chemotherapy-induced enteritis.  Irinotecan is the most likely offending agent.  He continues to have diarrhea.  The chemotherapy will be dose adjusted with cycle 2.  I discussed the treatment plan with Manuel Olson and his wife.  Recommendations: 1.   Replete potassium 2.   Lomotil for diarrhea 3.   Outpatient follow-up will be scheduled at the Cancer center for 10/10/2017    LOS: 5 days   [name], MD   10/02/2017, 11:16 AM

## 2017-10-02 NOTE — Progress Notes (Signed)
Port flushed with 10 ml Normal Saline and 500 units of heparin. Needle site covered with bandaid. Discharge instructions explained to wife and pt. Two prescriptions called in, need to be picked up. Pt discharged via wheelchair and wife drove him home. Prescription given for pt to receive out pt PT/OT therapy. Pt continues to have diarrhea.

## 2017-10-02 NOTE — Progress Notes (Signed)
Spoke with pt wife again about home health services. Wife states there is an outpatient PT office very close to to their home. She states that she prefers for him to do outpatient PT. This CM alerted RN that pt will need prescription from MD for outpatient PT. Marney Doctor RN,BSN,NCM [phone]

## 2017-10-02 NOTE — Telephone Encounter (Signed)
Scheduled appts per 1/21 sch msg - left voicemail for patient regarding appts.

## 2017-10-02 NOTE — Discharge Summary (Signed)
Physician Discharge Summary  Manuel Olson TFT:[id number] DOB: 02/29/1946 DOA: 09/26/2017  PCP: Renee Rival, NP  Admit date: 09/26/2017 Discharge date: 10/02/2017  Admitted From: home Disposition:  Home  Recommendations for Outpatient Follow-up:  1. Follow up with Oncology in 1-2 weeks 2. Please obtain BMP/CBC in one week   Home Health:no Equipment/Devices:none   Discharge Condition:stable CODE STATUS:full Diet recommendation: Heart Healthy  Brief/Interim Summary: 72 y.o. male past medical history significant for pancreatic cancer on chemotherapy, status post aortic valve replacement comes in for 1 week of nausea and vomiting not eating well he has seen his oncologist several times has received IV fluids and is felt better but comes in today with intractable nausea and vomiting.  Discharge Diagnoses:  Principal Problem:   Ileitis, terminal ([place]) Active Problems:   Severe aortic stenosis   Cancer of head of pancreas ([facility])   S/P TAVR (transcatheter aortic valve replacement)   Essential hypertension, benign   CAD (coronary artery disease)   Dehydration   FTT (failure to thrive) in adult   Hyponatremia   Malnutrition of moderate degree   Anemia due to antineoplastic chemotherapy   Diffuse abdominal pain   Renal cell carcinoma of right kidney ([place])  Dehydration: Resolved with IV fluid hydration.  Abdominal pain fever due to terminal ileitis likely infectious in nature: He started having massive amounts of diarrhea about 10 bowel movements a day, C. difficile PCR was negative CT scan of the abdomen was done that show inflammation of the terminal ileum. He was started on IV ciprofloxacin and Flagyl, his abdominal pain and his diarrhea improved he was able to tolerate his diet.  He was changed to oral antibiotic which she will continue for an additional 7 days as an outpatient.  Severe aortic stenosis status post transcatheter aortic valve  replacement: Stable.  Cancer of the head of the pancreas: Follow-up with oncology for chemotherapy as an outpatient. Right renal cell carcinoma: Follow-up with oncology as an outpatient.  Essential hypertension: Continue current regimen no changes made.  Thrombocytopenia: Likely due to chemotherapy improving slowly.  Antineoplastic induced anemia: Hemoglobin remained stable follow-up with hematology oncologist as an outpatient.  Discharge Instructions  Discharge Instructions    Diet - low sodium heart healthy   Complete by:  As directed    Increase activity slowly   Complete by:  As directed      Allergies as of 10/02/2017   No Known Allergies     Medication List    STOP taking these medications   glucose blood test strip Commonly known as:  ONETOUCH VERIO   insulin aspart 100 UNIT/ML FlexPen Commonly known as:  FIASP FLEXTOUCH   insulin degludec 100 UNIT/ML Sopn FlexTouch Pen Commonly known as:  TRESIBA   Insulin Pen Needle 31G X 8 MM Misc Commonly known as:  B-D ULTRAFINE III SHORT PEN   ONETOUCH VERIO w/Device Kit   oxyCODONE 5 MG immediate release tablet Commonly known as:  Oxy IR/ROXICODONE     TAKE these medications   aspirin EC 81 MG tablet Take 1 tablet (81 mg total) by mouth daily.   atorvastatin 20 MG tablet Commonly known as:  LIPITOR Take 1 tablet (20 mg total) by mouth daily.   baclofen 10 MG tablet Commonly known as:  LIORESAL Take 0.5 tablets (5 mg total) by mouth 3 (three) times daily as needed for muscle spasms.   carvedilol 3.125 MG tablet Commonly known as:  COREG TAKE (1) TABLET BY MOUTH TWICE A  DAY WITH MEALS (BREAKFAST AND SUPPER)   ciprofloxacin 500 MG tablet Commonly known as:  CIPRO Take 1 tablet (500 mg total) by mouth 2 (two) times daily for 10 days.   clopidogrel 75 MG tablet Commonly known as:  PLAVIX Take 1 tablet (75 mg total) by mouth daily.   diphenoxylate-atropine 2.5-0.025 MG tablet Commonly known as:   LOMOTIL Take 2 tablets by mouth 4 (four) times daily as needed for diarrhea or loose stools.   lidocaine-prilocaine cream Commonly known as:  EMLA Apply 1 application topically as needed. Apply small amount of cream over port site 1-2 hrs prior to chemo, cover with plastic wrap. What changed:    reasons to take this  additional instructions   lisinopril 10 MG tablet Commonly known as:  PRINIVIL,ZESTRIL TAKE 1 TABLET BY MOUTH ONCE DAILY.   metroNIDAZOLE 500 MG tablet Commonly known as:  FLAGYL Take 1 tablet (500 mg total) by mouth 3 (three) times daily for 14 days.   ondansetron 8 MG tablet Commonly known as:  ZOFRAN Take 1 tablet (8 mg total) by mouth every 8 (eight) hours as needed for nausea or vomiting.   prochlorperazine 5 MG tablet Commonly known as:  COMPAZINE Take 1 tablet (5 mg total) by mouth every 6 (six) hours as needed for nausea or vomiting.       No Known Allergies  Consultations:  GI   Procedures/Studies: Ct Abdomen Pelvis W Contrast  Result Date: 09/28/2017 CLINICAL DATA:  Abdominal pain.  History of pancreas cancer. EXAM: CT ABDOMEN AND PELVIS WITH CONTRAST TECHNIQUE: Multidetector CT imaging of the abdomen and pelvis was performed using the standard protocol following bolus administration of intravenous contrast. CONTRAST:  132m ISOVUE-300 IOPAMIDOL (ISOVUE-300) INJECTION 61% COMPARISON:  07/20/2017 FINDINGS: Lower chest: No acute abnormality. Hepatobiliary: No suspicious liver abnormality. The gallbladder appears normal. Pancreas: The ill defined mass described previously in the head neck region of the pancreas is again noted abutting the portal venous confluence, image 55 of series 7. 2.9 x 2.2 cm, image 55 of series 7. Previously 3.1 x 3.7 cm. There is dilatation of the main duct with atrophy of the pancreatic parenchyma in the body and tail. Spleen: Normal in size without focal abnormality. Adrenals/Urinary Tract: Small cysts in the right kidney noted.  No mass or hydronephrosis. Heterogeneously enhancing mass involving the upper pole of the left kidney is again identified measuring approximately 6.8 cm, image 80 of series 9. Similar to MRI from 07/27/2017. The left renal vein appears patent. There is no hydronephrosis. Urinary bladder is normal. Stomach/Bowel: Stomach is unremarkable. Abnormal increase caliber of the proximal and mid small bowel loops with air-fluid levels identified measuring up to 4.2 cm. Distal small bowel loops are decreased in caliber and demonstrate abnormal bowel wall edema/thickening measuring up to 9 mm. No significant bowel wall thickening of the: No evidence for perforation. No abscess. No pneumatosis identified. Extensive distal colonic diverticulosis without acute diverticulitis. Vascular/Lymphatic: Aortic atherosclerosis. Infrarenal abdominal aorta is increased in caliber measuring up to 2.6 cm, image 123 of series 9. 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 no upper abdominal adenopathy. No pelvic or inguinal adenopathy. Reproductive: Prostate is unremarkable. Other: There is a small to moderate volume of ascites within the abdomen and pelvis. No discrete fluid collections identified. Musculoskeletal: The bones appear diffusely osteopenic. Stable chronic compression fracture at L2. No suspicious bone lesions. IMPRESSION:  1. Wall thickening and inflammation involving distal small bowel loops identified. Findings compatible with acute enteritis which may be inflammatory or infectious in etiology. Ischemic enteritis is considered less favored although there is extensive atherosclerotic disease involving the abdomen and pelvis, the superior mesenteric artery and its branches appear patent. 2. Pathologic dilatation of the proximal and mid small bowel loops compatible with bowel obstruction. 3. Head of pancreas mass involving  the portal venous confluence is again noted with associated main duct dilatation and atrophy of the body and tail of pancreas. Mass is stable to slightly decreased in size from previous exam. No specific findings identified to suggest metastatic disease. 4. Heterogeneous lead enhancing mass involving the upper pole of left kidney compatible with renal cell carcinoma. 5.  Aortic Atherosclerosis (ICD10-I70.0). 6. Ectatic abdominal aorta at risk for aneurysm development. Recommend followup by ultrasound in 5 years. This recommendation follows ACR consensus guidelines: White Paper of the ACR Incidental Findings Committee II on Vascular Findings. J Am Coll Radiol 2013; 10:789-794. 7. Aneurysmal dilatation of the right common iliac artery and focal thrombosed aneurysm is noted involving the right internal iliac artery. Electronically Signed   By: Kerby Moors M.D.   On: 09/28/2017 16:32   Dg Chest Port 1 View  Result Date: 09/15/2017 CLINICAL DATA:  Left Port-A-Cath insertion EXAM: PORTABLE CHEST 1 VIEW COMPARISON:  08/22/2017 FINDINGS: Left subclavian Port-A-Cath placement with the tip in the SVC. No pneumothorax. Heart is normal size. No confluent airspace opacities or effusions. No acute bony abnormality. IMPRESSION: Left Port-A-Cath tip in the SVC.  No pneumothorax. Electronically Signed   By: Rolm Baptise M.D.   On: 09/15/2017 09:10   Dg Abd 2 Views  Result Date: 09/30/2017 CLINICAL DATA:  Abdominal pain and distention. EXAM: ABDOMEN - 2 VIEW COMPARISON:  CT scan September 28, 2017 FINDINGS: No free air seen on the right side up decubitus film. A small bowel obstruction is identified. Hernia mesh is seen in the lower pelvis. No other acute abnormalities. IMPRESSION: Continued small bowel obstruction.  No free air identified. Electronically Signed   By: Dorise Bullion III M.D   On: 09/30/2017 23:40   Dg Abd Acute W/chest  Result Date: 09/23/2017 CLINICAL DATA:  Nausea, vomiting and diarrhea. Undergoing  chemotherapy for pancreatic cancer. Ex-smoker. EXAM: DG ABDOMEN ACUTE W/ 1V CHEST COMPARISON:  PET-CT dated 08/30/2017 and portable chest dated 09/15/2017. Abdomen MR dated 07/27/2017. Chest, abdomen and pelvis CT dated 07/20/2017. FINDINGS: Normal sized heart. Clear lungs. The lungs remain hyperexpanded. Stable left subclavian porta catheter with its tip in the superior vena cava. Stable aortic valve stent. Thoracic spine degenerative changes. Normal bowel gas pattern without free peritoneal air. Hernia repair mesh markers in the right lower pelvic region. Lumbar spine degenerative changes at L2 vertebral compression deformity without significant change since 07/27/2017 and 07/20/2017. IMPRESSION: 1. No acute abnormality. 2. COPD. Electronically Signed   By: Claudie Revering M.D.   On: 09/23/2017 14:58   Dg Fluoro Guide Cv Line-no Report  Result Date: 09/15/2017 Fluoroscopy was utilized by the requesting physician.  No radiographic interpretation.    Subjective: No complaints tolerating his diet feels great.  Discharge Exam: Vitals:   10/01/17 2027 10/02/17 0520  BP: (!) 137/59 (!) 151/67  Pulse: 84 76  Resp: 18 18  Temp: 98.1 F (36.7 C) 98.3 F (36.8 C)  SpO2: 98% 96%   Vitals:   10/01/17 1205 10/01/17 1700 10/01/17 2027 10/02/17 0520  BP: (!) 141/72 (!) 148/77 (!) 137/59 [name])  151/67  Pulse:  87 84 76  Resp:  16 18 18   Temp:  97.9 F (36.6 C) 98.1 F (36.7 C) 98.3 F (36.8 C)  TempSrc:  Oral Oral Oral  SpO2:  95% 98% 96%  Weight:    77 kg (169 lb 12.8 oz)  Height:        General: Pt is alert, awake, not in acute distress Cardiovascular: RRR, S1/S2 +, no rubs, no gallops Respiratory: CTA bilaterally, no wheezing, no rhonchi Abdominal: Soft, NT, ND, bowel sounds + Extremities: no edema, no cyanosis    The results of significant diagnostics from this hospitalization (including imaging, microbiology, ancillary and laboratory) are listed below for reference.      Microbiology: Recent Results (from the past 240 hour(s))  Respiratory Panel by PCR     Status: None   Collection Time: 09/27/17 10:07 AM  Result Value Ref Range Status   Adenovirus NOT DETECTED NOT DETECTED Final   Coronavirus 229E NOT DETECTED NOT DETECTED Final   Coronavirus HKU1 NOT DETECTED NOT DETECTED Final   Coronavirus NL63 NOT DETECTED NOT DETECTED Final   Coronavirus OC43 NOT DETECTED NOT DETECTED Final   Metapneumovirus NOT DETECTED NOT DETECTED Final   Rhinovirus / Enterovirus NOT DETECTED NOT DETECTED Final   Influenza A NOT DETECTED NOT DETECTED Final   Influenza B NOT DETECTED NOT DETECTED Final   Parainfluenza Virus 1 NOT DETECTED NOT DETECTED Final   Parainfluenza Virus 2 NOT DETECTED NOT DETECTED Final   Parainfluenza Virus 3 NOT DETECTED NOT DETECTED Final   Parainfluenza Virus 4 NOT DETECTED NOT DETECTED Final   Respiratory Syncytial Virus NOT DETECTED NOT DETECTED Final   Bordetella pertussis NOT DETECTED NOT DETECTED Final   Chlamydophila pneumoniae NOT DETECTED NOT DETECTED Final   Mycoplasma pneumoniae NOT DETECTED NOT DETECTED Final  C difficile quick scan w PCR reflex     Status: None   Collection Time: 09/27/17  2:27 PM  Result Value Ref Range Status   C Diff antigen NEGATIVE NEGATIVE Final   C Diff toxin NEGATIVE NEGATIVE Final   C Diff interpretation No C. difficile detected.  Final     Labs: BNP (last 3 results) Recent Labs    08/17/17 0851  BNP 977.4*   Basic Metabolic Panel: Recent Labs  Lab 09/26/17 0943  09/28/17 0500 09/29/17 1114 09/30/17 0446 10/01/17 0450 10/02/17 0640  NA 129*   < > 130* 129* 131* 129* 132*  K 3.3*   < > 3.3* 3.9 3.4* 3.5 3.1*  CL 99*   < > 102 101 103 99* 102  CO2 21*   < > 20* 22 22 23 25   GLUCOSE 161*   < > 124* 167* 153* 169* 184*  BUN 24*   < > 15 16 16 12 9   CREATININE 1.05   < > 0.94 0.85 0.83 0.80 0.77  CALCIUM 8.1*   < > 7.9* 7.8* 7.8* 7.7* 7.8*  MG 1.5*  --  1.7  --  1.8  --   --    < > =  values in this interval not displayed.   Liver Function Tests: Recent Labs  Lab 09/25/17 1301 09/26/17 0943 09/28/17 0500 09/30/17 0446  AST 12 16 11* 17  ALT 26 21 16* 18  ALKPHOS 87 65 58 58  BILITOT 0.3 0.3 1.0 0.7  PROT 5.5* 5.1* 4.5* 4.7*  ALBUMIN 2.8* 2.6* 2.1* 2.1*   Recent Labs  Lab 09/25/17 1301 09/26/17 0943 09/29/17 1114  LIPASE <  10* <10* 19  AMYLASE 15*  --   --    No results for input(s): AMMONIA in the last 168 hours. CBC: Recent Labs  Lab 09/25/17 1301 09/26/17 0943 09/27/17 0409 09/30/17 0446  WBC 3.1* 2.9* 3.4* 6.3  NEUTROABS 1.7 1.2* 1.8 3.3  HGB  --  10.3* 10.1* 10.1*  HCT 34.4* 30.1* 29.8* 30.4*  MCV 90.2 88.8 88.4 88.6  PLT 89* 86* 103* 204   Cardiac Enzymes: No results for input(s): CKTOTAL, CKMB, CKMBINDEX, TROPONINI in the last 168 hours. BNP: Invalid input(s): POCBNP CBG: No results for input(s): GLUCAP in the last 168 hours. D-Dimer No results for input(s): DDIMER in the last 72 hours. Hgb A1c No results for input(s): HGBA1C in the last 72 hours. Lipid Profile No results for input(s): CHOL, HDL, LDLCALC, TRIG, CHOLHDL, LDLDIRECT in the last 72 hours. Thyroid function studies No results for input(s): TSH, T4TOTAL, T3FREE, THYROIDAB in the last 72 hours.  Invalid input(s): FREET3 Anemia work up No results for input(s): VITAMINB12, FOLATE, FERRITIN, TIBC, IRON, RETICCTPCT in the last 72 hours. Urinalysis    Component Value Date/Time   COLORURINE YELLOW 09/26/2017 1901   APPEARANCEUR HAZY (A) 09/26/2017 1901   LABSPEC 1.024 09/26/2017 1901   PHURINE 5.0 09/26/2017 1901   GLUCOSEU 50 (A) 09/26/2017 1901   HGBUR NEGATIVE 09/26/2017 1901   BILIRUBINUR NEGATIVE 09/26/2017 1901   KETONESUR NEGATIVE 09/26/2017 1901   PROTEINUR NEGATIVE 09/26/2017 1901   NITRITE NEGATIVE 09/26/2017 1901   LEUKOCYTESUR NEGATIVE 09/26/2017 1901   Sepsis Labs Invalid input(s): PROCALCITONIN,  WBC,  LACTICIDVEN Microbiology Recent Results (from the  past 240 hour(s))  Respiratory Panel by PCR     Status: None   Collection Time: 09/27/17 10:07 AM  Result Value Ref Range Status   Adenovirus NOT DETECTED NOT DETECTED Final   Coronavirus 229E NOT DETECTED NOT DETECTED Final   Coronavirus HKU1 NOT DETECTED NOT DETECTED Final   Coronavirus NL63 NOT DETECTED NOT DETECTED Final   Coronavirus OC43 NOT DETECTED NOT DETECTED Final   Metapneumovirus NOT DETECTED NOT DETECTED Final   Rhinovirus / Enterovirus NOT DETECTED NOT DETECTED Final   Influenza A NOT DETECTED NOT DETECTED Final   Influenza B NOT DETECTED NOT DETECTED Final   Parainfluenza Virus 1 NOT DETECTED NOT DETECTED Final   Parainfluenza Virus 2 NOT DETECTED NOT DETECTED Final   Parainfluenza Virus 3 NOT DETECTED NOT DETECTED Final   Parainfluenza Virus 4 NOT DETECTED NOT DETECTED Final   Respiratory Syncytial Virus NOT DETECTED NOT DETECTED Final   Bordetella pertussis NOT DETECTED NOT DETECTED Final   Chlamydophila pneumoniae NOT DETECTED NOT DETECTED Final   Mycoplasma pneumoniae NOT DETECTED NOT DETECTED Final  C difficile quick scan w PCR reflex     Status: None   Collection Time: 09/27/17  2:27 PM  Result Value Ref Range Status   C Diff antigen NEGATIVE NEGATIVE Final   C Diff toxin NEGATIVE NEGATIVE Final   C Diff interpretation No C. difficile detected.  Final     Time coordinating discharge: Over 30 minutes  SIGNED:   Charlynne Cousins, MD  [facility] 10/02/2017, 8:48 AM Pager   If 7PM-7AM, please contact night-coverage [email or web address] Password [email or web address]

## 2017-10-03 ENCOUNTER — Encounter: Payer: Self-pay | Admitting: Thoracic Surgery (Cardiothoracic Vascular Surgery)

## 2017-10-04 ENCOUNTER — Telehealth: Payer: Self-pay | Admitting: *Deleted

## 2017-10-04 DIAGNOSIS — E876 Hypokalemia: Secondary | ICD-10-CM

## 2017-10-04 MED ORDER — POTASSIUM CHLORIDE CRYS ER 20 MEQ PO TBCR: 20.0000 meq | EXTENDED_RELEASE_TABLET | Freq: Two times a day (BID) | ORAL | 0 refills | Status: DC

## 2017-10-04 NOTE — Telephone Encounter (Addendum)
Returned call to wife with MD instructions. Teach back done. He will stop antibiotic and begin Imodium and Lomotil for diarrhea as needed. Pt is not taking any potassium, per wife. Order received for potassium 6mq BID.

## 2017-10-04 NOTE — Telephone Encounter (Signed)
Call from pt's wife reporting he continues to have about 8 watery stools per day. Asking if this is to be expected while on antibiotics? He was prescribed Cipro for 10 days and Flagyl for 14 days at discharge.  Wife reports he is otherwise eating and drinking better. He is ambulating and pain is gone.  Reviewed above with Dr. Benay Spice: He can stop the antibiotic. Take Imodium and Lomotil for diarrhea.

## 2017-10-04 NOTE — Addendum Note (Signed)
Addended by: Brien Few on: 10/04/2017 02:59 PM   Modules accepted: Orders

## 2017-10-10 ENCOUNTER — Encounter: Payer: Medicare Other | Admitting: Nutrition

## 2017-10-10 ENCOUNTER — Encounter: Payer: Self-pay | Admitting: Nurse Practitioner

## 2017-10-10 ENCOUNTER — Encounter: Payer: Self-pay | Admitting: Nutrition

## 2017-10-10 ENCOUNTER — Inpatient Hospital Stay: Payer: Medicare Other

## 2017-10-10 ENCOUNTER — Telehealth: Payer: Self-pay | Admitting: Oncology

## 2017-10-10 ENCOUNTER — Inpatient Hospital Stay (HOSPITAL_BASED_OUTPATIENT_CLINIC_OR_DEPARTMENT_OTHER): Payer: Medicare Other | Admitting: Nurse Practitioner

## 2017-10-10 VITALS — BP 142/54 | HR 74 | Temp 97.8°F | Resp 18 | Ht 70.0 in | Wt 156.0 lb

## 2017-10-10 DIAGNOSIS — C250 Malignant neoplasm of head of pancreas: Secondary | ICD-10-CM

## 2017-10-10 DIAGNOSIS — Z5189 Encounter for other specified aftercare: Secondary | ICD-10-CM | POA: Diagnosis not present

## 2017-10-10 DIAGNOSIS — I252 Old myocardial infarction: Secondary | ICD-10-CM

## 2017-10-10 DIAGNOSIS — R197 Diarrhea, unspecified: Secondary | ICD-10-CM | POA: Diagnosis not present

## 2017-10-10 DIAGNOSIS — I2510 Atherosclerotic heart disease of native coronary artery without angina pectoris: Secondary | ICD-10-CM | POA: Diagnosis not present

## 2017-10-10 DIAGNOSIS — Z95828 Presence of other vascular implants and grafts: Secondary | ICD-10-CM

## 2017-10-10 DIAGNOSIS — K521 Toxic gastroenteritis and colitis: Secondary | ICD-10-CM

## 2017-10-10 DIAGNOSIS — T451X5A Adverse effect of antineoplastic and immunosuppressive drugs, initial encounter: Secondary | ICD-10-CM

## 2017-10-10 DIAGNOSIS — N2889 Other specified disorders of kidney and ureter: Secondary | ICD-10-CM

## 2017-10-10 DIAGNOSIS — Z87891 Personal history of nicotine dependence: Secondary | ICD-10-CM

## 2017-10-10 DIAGNOSIS — C259 Malignant neoplasm of pancreas, unspecified: Secondary | ICD-10-CM

## 2017-10-10 DIAGNOSIS — E119 Type 2 diabetes mellitus without complications: Secondary | ICD-10-CM

## 2017-10-10 DIAGNOSIS — I10 Essential (primary) hypertension: Secondary | ICD-10-CM

## 2017-10-10 DIAGNOSIS — R531 Weakness: Secondary | ICD-10-CM

## 2017-10-10 DIAGNOSIS — R112 Nausea with vomiting, unspecified: Secondary | ICD-10-CM | POA: Diagnosis not present

## 2017-10-10 DIAGNOSIS — R630 Anorexia: Secondary | ICD-10-CM | POA: Diagnosis not present

## 2017-10-10 DIAGNOSIS — R634 Abnormal weight loss: Secondary | ICD-10-CM | POA: Diagnosis not present

## 2017-10-10 LAB — CBC WITH DIFFERENTIAL/PLATELET
Basophils Absolute: 0.1 10*3/uL (ref 0.0–0.1)
Basophils Relative: 1 %
Eosinophils Absolute: 0 10*3/uL (ref 0.0–0.5)
Eosinophils Relative: 0 %
HCT: 32.4 % — ABNORMAL LOW (ref 38.4–49.9)
Hemoglobin: 10.7 g/dL — ABNORMAL LOW (ref 13.0–17.1)
Lymphocytes Relative: 11 %
Lymphs Abs: 1 10*3/uL (ref 0.9–3.3)
MCH: 29.8 pg (ref 27.2–33.4)
MCHC: 33.2 g/dL (ref 32.0–36.0)
MCV: 89.9 fL (ref 79.3–98.0)
Monocytes Absolute: 0.9 10*3/uL (ref 0.1–0.9)
Monocytes Relative: 10 %
Neutro Abs: 7.1 10*3/uL — ABNORMAL HIGH (ref 1.5–6.5)
Neutrophils Relative %: 78 %
Platelets: 244 10*3/uL (ref 140–400)
RBC: 3.61 MIL/uL — ABNORMAL LOW (ref 4.20–5.82)
RDW: 14.6 % (ref 11.0–15.6)
WBC: 9.1 10*3/uL (ref 4.0–10.3)

## 2017-10-10 LAB — COMPREHENSIVE METABOLIC PANEL
ALT: 20 U/L (ref 0–55)
AST: 18 U/L (ref 5–34)
Albumin: 2.8 g/dL — ABNORMAL LOW (ref 3.5–5.0)
Alkaline Phosphatase: 70 U/L (ref 40–150)
Anion gap: 8 (ref 3–11)
BUN: 12 mg/dL (ref 7–26)
CO2: 27 mmol/L (ref 22–29)
Calcium: 8.5 mg/dL (ref 8.4–10.4)
Chloride: 99 mmol/L (ref 98–109)
Creatinine, Ser: 1.01 mg/dL (ref 0.70–1.30)
GFR calc Af Amer: >60 mL/min (ref >60)
GFR calc non Af Amer: >60 mL/min (ref >60)
Glucose, Bld: 267 mg/dL — ABNORMAL HIGH (ref 70–140)
Potassium: 3.5 mmol/L (ref 3.5–5.1)
Sodium: 134 mmol/L — ABNORMAL LOW (ref 136–145)
Total Bilirubin: 0.3 mg/dL (ref 0.2–1.2)
Total Protein: 5.6 g/dL — ABNORMAL LOW (ref 6.4–8.3)

## 2017-10-10 MED ORDER — HEPARIN SOD (PORK) LOCK FLUSH 100 UNIT/ML IV SOLN
500.0000 [IU] | Freq: Once | INTRAVENOUS | Status: AC
  Administered 2017-10-10: 500 [IU]
  Filled 2017-10-10: qty 5

## 2017-10-10 MED ORDER — SODIUM CHLORIDE 0.9% FLUSH
10.0000 mL | Freq: Once | INTRAVENOUS | Status: AC
  Administered 2017-10-10: 10 mL
  Filled 2017-10-10: qty 10

## 2017-10-10 NOTE — Telephone Encounter (Signed)
Scheduled appt per 1/29 los - per GBS okay to schedule on 2/4 due to capped day on 2/6 - left message for patient with appt date and time.

## 2017-10-10 NOTE — Progress Notes (Signed)
Patient was scheduled for nutrition follow up during chemo. Chemo was cancelled so nutrition follow up not completed.

## 2017-10-10 NOTE — Progress Notes (Signed)
No treatment today per Dr. Benay Spice.

## 2017-10-10 NOTE — Telephone Encounter (Signed)
Unable to scheduled appt for next treatment due to capped day - logged - will contact patient when appt is scheduled.

## 2017-10-10 NOTE — Progress Notes (Addendum)
[place] OFFICE PROGRESS NOTE   Diagnosis: Pancreas cancer  INTERVAL HISTORY:   Manuel Olson returns for follow-up.  He completed cycle 1 FOLFIRINOX 09/16/2017.  He was hospitalized with nausea, diarrhea and abdominal pain, likely chemotherapy-induced enteritis, 09/26/2017 through 10/02/2017.  On 10/06/2017 he had multiple episodes of nausea/vomiting/diarrhea.  When he woke up Saturday morning he was feeling better.  He has had no further vomiting.  He has periodic minimal diarrhea.  Appetite is much better.  He continues to feel weak.  He is in a wheelchair today.  No abdominal pain.  Objective:  Vital signs in last 24 hours:  Blood pressure (!) 142/54, pulse 74, temperature 97.8 F (36.6 C), temperature source Oral, resp. rate 18, height 5' 10"  (1.778 m), weight 156 lb (70.8 kg), SpO2 100 %.    HEENT: No thrush or ulcers. Resp: Lungs clear bilaterally. Cardio: Regular rate and rhythm. GI: Abdomen is soft and nontender.  No hepatomegaly. Vascular: No leg edema. Port-A-Cath site without erythema.  Resolving ecchymosis at the left chest/breast.   Lab Results:  Lab Results  Component Value Date   WBC 9.1 10/10/2017   HGB 10.7 (L) 10/10/2017   HCT 32.4 (L) 10/10/2017   MCV 89.9 10/10/2017   PLT 244 10/10/2017   NEUTROABS 7.1 (H) 10/10/2017    Imaging:  No results found.  Medications: I have reviewed the patient's current medications.  Assessment/Plan: 1. Adenocarcinoma of the pancreas head/neck, status post an EUS biopsy 08/02/2017 confirming adenocarcinoma ? EUS 08/02/2017-pancreas head/neck mass, abutment of the portal vein, no peripancreatic adenopathy ? MRI abdomen 07/27/2017-head/neck pancreas mass with mass-effect on the portal splenic confluence, no involvement of the celiac axis or superior mesenteric artery, no evidence of metastatic disease ? PET scan 08/31/2017-very hypermetabolism at the pancreatic head mass, no evidence of metastatic disease.  The isolated right lower lobe nodule below PET resolution, left sided renal mass with decreased activity relative to the normal adjacent kidney ? Cycle 1 FOLFIRINOX 09/16/2017  2. 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 Barry Dienes  9.   Nausea, diarrhea, and abdominal pain- chemotherapy enteritis versus bowel obstruction versus ischemic enteritis  10.  Dehydration secondary to nausea and diarrhea-resolved  11.  Pancytopenia secondary to chemotherapy- improved    Disposition: Manuel Olson has completed 1 cycle of FOLFIRINOX.  Post chemotherapy course complicated by nausea, diarrhea and abdominal pain requiring hospitalization.  Symptoms felt to most likely be related to chemotherapy induced enteritis due to Irinotecan. He appears improved but continues to have a poor performance status.  We decided to hold chemotherapy today.  Irinotecan will be placed on hold and 5-fluorouracil dose reduced when chemotherapy is resumed.  He will return for reevaluation/FOLFOX on 10/18/2017.  He will contact the office in the interim with any problems.  Patient seen with Dr. Benay Spice.  25 minutes were spent face-to-face at today's visit with the majority of that time involved in counseling/coordination of care.  [name] ANP/GNP-BC   10/10/2017  11:16 AM This was a shared visit with [name].  Manuel Olson was interviewed and examined.  He continues to recover from the chemotherapy-induced enteritis.  Chemotherapy will be held until next week in order to allow for further improvement in his nutrition  and performance status.  He will return for an office visit with the plan to  begin cycle 2 chemotherapy 10/16/2017.  Irinotecan will be held and 5-FU will be dose reduced with this cycle.  Julieanne Manson, MD

## 2017-10-12 ENCOUNTER — Telehealth: Payer: Self-pay

## 2017-10-12 NOTE — Telephone Encounter (Signed)
Spoke with pt regarding urinary symptoms. Pt states "at night i've had an increased urge to urinate about every 2hours. When I go its a light stream and light output with slight burning". Pt questions "should I go see my urologist?". Per Dr. Benay Spice, pt to follow up with urologist. Pt voiced understanding and knows to call with any further questions/concerns.

## 2017-10-13 ENCOUNTER — Encounter: Payer: Self-pay | Admitting: Internal Medicine

## 2017-10-13 ENCOUNTER — Ambulatory Visit (INDEPENDENT_AMBULATORY_CARE_PROVIDER_SITE_OTHER): Payer: Medicare Other | Admitting: Internal Medicine

## 2017-10-13 VITALS — BP 160/98 | HR 87 | Ht 70.0 in | Wt 155.8 lb

## 2017-10-13 DIAGNOSIS — E1165 Type 2 diabetes mellitus with hyperglycemia: Secondary | ICD-10-CM

## 2017-10-13 DIAGNOSIS — E1159 Type 2 diabetes mellitus with other circulatory complications: Secondary | ICD-10-CM

## 2017-10-13 DIAGNOSIS — I2510 Atherosclerotic heart disease of native coronary artery without angina pectoris: Secondary | ICD-10-CM

## 2017-10-13 MED ORDER — BASAGLAR KWIKPEN 100 UNIT/ML ~~LOC~~ SOPN: 20.0000 [IU] | PEN_INJECTOR | Freq: Every day | SUBCUTANEOUS | 5 refills | Status: DC

## 2017-10-13 MED ORDER — INSULIN PEN NEEDLE 32G X 4 MM MISC: 3 refills | Status: DC

## 2017-10-13 NOTE — Patient Instructions (Addendum)
Please start: - Basaglar 15 units at bedtime tonight, then increase to 20 units if sugars in am >150. After 3 days, if sugars in am not <150, increase the dose by 3 units. Stop if sugars in am <150 or you reached 30 units.  Please change to Glucerna.  Please let me know if the sugars are consistently <80 or >200.  Please return in 1.5 months with your sugar log.   PATIENT INSTRUCTIONS FOR TYPE 2 DIABETES:  DIET AND EXERCISE Diet and exercise is an important part of diabetic treatment.  We recommended aerobic exercise in the form of brisk walking (working between 40-60% of maximal aerobic capacity, similar to brisk walking) for 150 minutes per week (such as 30 minutes five days per week) along with 3 times per week performing 'resistance' training (using various gauge rubber tubes with handles) 5-10 exercises involving the major muscle groups (upper body, lower body and core) performing 10-15 repetitions (or near fatigue) each exercise. Start at half the above goal but build slowly to reach the above goals. If limited by weight, joint pain, or disability, we recommend daily walking in a swimming pool with water up to waist to reduce pressure from joints while allow for adequate exercise.    BLOOD GLUCOSES Monitoring your blood glucoses is important for continued management of your diabetes. Please check your blood glucoses 2-4 times a day: fasting, before meals and at bedtime (you can rotate these measurements - e.g. one day check before the 3 meals, the next day check before 2 of the meals and before bedtime, etc.).   HYPOGLYCEMIA (low blood sugar) Hypoglycemia is usually a reaction to not eating, exercising, or taking too much insulin/ other diabetes drugs.  Symptoms include tremors, sweating, hunger, confusion, headache, etc. Treat IMMEDIATELY with 15 grams of Carbs: . 4 glucose tablets .  cup regular juice/soda . 2 tablespoons raisins . 4 teaspoons sugar . 1 tablespoon  honey Recheck blood glucose in 15 mins and repeat above if still symptomatic/blood glucose <100.  RECOMMENDATIONS TO REDUCE YOUR RISK OF DIABETIC COMPLICATIONS: * Take your prescribed MEDICATION(S) * Follow a DIABETIC diet: Complex carbs, fiber rich foods, (monounsaturated and polyunsaturated) fats * AVOID saturated/trans fats, high fat foods, >2,300 mg salt per day. * EXERCISE at least 5 times a week for 30 minutes or preferably daily.  * DO NOT SMOKE OR DRINK more than 1 drink a day. * Check your FEET every day. Do not wear tightfitting shoes. Contact us if you develop an ulcer * See your EYE doctor once a year or more if needed * Get a FLU shot once a year * Get a PNEUMONIA vaccine once before and once after age 26 years  GOALS:  * Your Hemoglobin A1c of <7%  * fasting sugars need to be <130 * after meals sugars need to be <180 (2h after you start eating) * Your Systolic BP should be 793 or lower  * Your Diastolic BP should be 80 or lower  * Your HDL (Good Cholesterol) should be 40 or higher  * Your LDL (Bad Cholesterol) should be 100 or lower. * Your Triglycerides should be 150 or lower  * Your Urine microalbumin (kidney function) should be <30 * Your Body Mass Index should be 25 or lower    Please consider the following ways to cut down carbs and fat and increase fiber and micronutrients in your diet: - substitute whole grain for white bread or pasta - substitute brown rice for white  rice - substitute 90-calorie flat bread pieces for slices of bread when possible - substitute sweet potatoes or yams for white potatoes - substitute humus for margarine - substitute tofu for cheese when possible - substitute almond or rice milk for regular milk (would not drink soy milk daily due to concern for soy estrogen influence on breast cancer risk) - substitute dark chocolate for other sweets when possible - substitute water - can add lemon or orange slices for taste - for diet sodas  (artificial sweeteners will trick your body that you can eat sweets without getting calories and will lead you to overeating and weight gain in the long run) - do not skip breakfast or other meals (this will slow down the metabolism and will result in more weight gain over time)  - can try smoothies made from fruit and almond/rice milk in am instead of regular breakfast - can also try old-fashioned (not instant) oatmeal made with almond/rice milk in am - order the dressing on the side when eating salad at a restaurant (pour less than half of the dressing on the salad) - eat as little meat as possible - can try juicing, but should not forget that juicing will get rid of the fiber, so would alternate with eating raw veg./fruits or drinking smoothies - use as little oil as possible, even when using olive oil - can dress a salad with a mix of balsamic vinegar and lemon juice, for e.g. - use agave nectar, stevia sugar, or regular sugar rather than artificial sweateners - steam or broil/roast veggies  - snack on veggies/fruit/nuts (unsalted, preferably) when possible, rather than processed foods - reduce or eliminate aspartame in diet (it is in diet sodas, chewing gum, etc) Read the labels!

## 2017-10-13 NOTE — Progress Notes (Signed)
Patient ID: Manuel Olson, male   DOB: [birth year], 72 y.o.   MRN: [id number]   HPI: Manuel Olson is a 72 y.o.-year-old male, referred by his oncologist, Dr. Benay Spice, for management of DM2, dx in 01/2016, uncontrolled, with complications (CAD - s/p AMI 11/2016 - s/p 4x DES; Ao stenosis). He saw Dr. Dorris Fetch before.  He would not want to return to see him.  He is here with his wife who offers a significant amount of history, especially regarding his medical history, his previous diabetes medicines, and his sugar levels at home.  Patient was recently diagnosed with pancreatic cancer (07/2017). Started ChTx 09/16/2017.  He also has to have a kidney removed for RCC >> this will be done at the same time with the pancreatic resection.   Last hemoglobin A1c was: Lab Results  Component Value Date   HGBA1C 10.4 (H) 08/17/2017   Pt was on a regimen of: - Glipizide 5 mg daily - now off - Tresiba 20 units at bedtime (samples, cannot afford this)  - now off  Pt was checking his sugars 4x a day while on insulin, now not checking: - am: 82, 128-242 - 2h after b'fast: n/c - before lunch: 140-301 - 2h after lunch: n/c - before dinner: 127-373, 398 - 2h after dinner: n/c - bedtime: 224-341 - nighttime: n/c No lows. Lowest sugar was 82; ?  hypoglycemia awareness.  Highest sugar was 398.  Glucometer: True Metrix  Pt's meals are: - Breakfast: bacon + eggs + English + Gatorade/V8/Boost - Lunch: grilled ham and cheese sandwich + potato chips - Dinner: chicken + veggies + starch - Snacks: fruit cup, pudding  - 1x a day  - no CKD, last BUN/creatinine:  Lab Results  Component Value Date   BUN 12 10/10/2017   BUN 9 10/02/2017   CREATININE 1.01 10/10/2017   CREATININE 0.77 10/02/2017  On Lisinopril. - + HL; last set of lipids: Lab Results  Component Value Date   CHOL 102 03/16/2017   HDL 36 (L) 03/16/2017   LDLCALC 52 03/16/2017   TRIG 71 03/16/2017   CHOLHDL 2.8 03/16/2017  On  Lipitor 20. - last eye exam was in 2018. No DR."I had a stroke in my eye". + h/o Laser Sx. - no numbness but + tingling in his feet.  Pt has FH of DM in brother.  He also has a history of hypertension.  ROS: Constitutional: + Weight loss, + fatigue, + excessive urination and nocturia Eyes: no blurry vision, no xerophthalmia ENT: no sore throat, no nodules palpated in throat, no dysphagia/odynophagia, no hoarseness Cardiovascular: no CP/SOB/palpitations/leg swelling Respiratory: no cough/SOB Gastrointestinal: + Nausea and vomiting; No diarrhea or constipation Musculoskeletal: no muscle/joint aches Skin: no rashes Neurological: no tremors/numbness/+ tingling/no dizziness Psychiatric: no depression/anxiety  Past Medical History:  Diagnosis Date  . Arthritis   . CAD (coronary artery disease)    a. 11/2016 in [place]: STEMI s/p DES to mid LAD, DES to diagonal, DES x 2 to proximal and mid RCA   . Essential hypertension   . Former smoker   . GERD (gastroesophageal reflux disease)   . Hyperlipidemia   . Pancreatic cancer ([facility])   . Renal mass   . S/P TAVR (transcatheter aortic valve replacement)    a. 08/2017:  Edwards Sapien 3 THV (size 26 mm, model #9600CM26A , serial # [id number])  . Severe aortic stenosis    a. 08/2017: s/p TAVR by Dr. Angelena Form and Dr. Cyndia Bent.   . 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 Type 2 diabetes mellitus ([place])    Past Surgical History:  Procedure Laterality Date  . APPENDECTOMY    . EUS N/A 08/02/2017   Procedure: UPPER ENDOSCOPIC ULTRASOUND (EUS) RADIAL;  Surgeon: [name], MD;  Location: [facility];  Service: Endoscopy;  Laterality: N/A;  . EYE SURGERY Left   . HAND SURGERY Bilateral   . HERNIA REPAIR Right   . PORTACATH PLACEMENT N/A 09/15/2017   Procedure: INSERTION PORT-A-CATH;  Surgeon: [name], MD;  Location: [facility];  Service: General;  Laterality: N/A;  . RIGHT/LEFT HEART CATH AND CORONARY ANGIOGRAPHY N/A 07/05/2017    Procedure: RIGHT/LEFT HEART CATH AND CORONARY ANGIOGRAPHY;  Surgeon: [name], MD;  Location: [facility];  Service: Cardiovascular;  Laterality: N/A;  . SHOULDER ARTHROSCOPY WITH ROTATOR CUFF REPAIR Left   . TEE WITHOUT CARDIOVERSION N/A 08/22/2017   Procedure: TRANSESOPHAGEAL ECHOCARDIOGRAM (TEE);  Surgeon: Burnell Blanks, MD;  Location: [facility];  Service: Open Heart Surgery;  Laterality: N/A;  . TRANSCATHETER AORTIC VALVE REPLACEMENT, TRANSFEMORAL N/A 08/22/2017   Procedure: TRANSCATHETER AORTIC VALVE REPLACEMENT, TRANSFEMORAL;  Surgeon: Burnell Blanks, MD;  Location: [facility];  Service: Open Heart Surgery;  Laterality: N/A;   Social History   Socioeconomic History  . Marital status: Married    Spouse name: Not on file  . Number of children: Not on file  Social Needs  Occupational History  .  Retired  Tobacco Use  . Smoking status: Former Smoker    Packs/day: 1.00    Years: 57.00    Pack years: 57.00    Types: Cigarettes    Start date: 09/12/1958    Last attempt to quit: 2018    Years since quitting: 1.5  . Smokeless tobacco: Never Used  Substance and Sexual Activity  . Alcohol use: No  . Drug use: No   Current Outpatient Medications on File Prior to Visit  Medication Sig Dispense Refill  . aspirin EC 81 MG tablet Take 1 tablet (81 mg total) by mouth daily. 90 tablet 3  . atorvastatin (LIPITOR) 20 MG tablet Take 1 tablet (20 mg total) by mouth daily. 30 tablet 6  . baclofen (LIORESAL) 10 MG tablet Take 0.5 tablets (5 mg total) by mouth 3 (three) times daily as needed for muscle spasms. 30 each 0  . carvedilol (COREG) 3.125 MG tablet TAKE (1) TABLET BY MOUTH TWICE A DAY WITH MEALS (BREAKFAST AND SUPPER) 180 tablet 2  . clopidogrel (PLAVIX) 75 MG tablet Take 1 tablet (75 mg total) by mouth daily. 90 tablet 0  . diphenoxylate-atropine (LOMOTIL) 2.5-0.025 MG tablet Take 2 tablets by mouth 4 (four) times daily as needed for diarrhea or loose stools. 30  tablet 0  . lidocaine-prilocaine (EMLA) cream Apply 1 application topically as needed. Apply small amount of cream over port site 1-2 hrs prior to chemo, cover with plastic wrap. (Patient taking differently: Apply 1 application topically as needed (for recieving chemo in port site). Apply small amount of cream over port site 1-2 hrs prior to chemo, cover with plastic wrap.) 30 g 0  . ondansetron (ZOFRAN) 8 MG tablet Take 1 tablet (8 mg total) by mouth every 8 (eight) hours as needed for nausea or vomiting. 20 tablet 0  . prochlorperazine (COMPAZINE) 5 MG tablet Take 1 tablet (5 mg total) by mouth every 6 (six) hours as needed for nausea or vomiting. 30 tablet 0  . lisinopril (  PRINIVIL,ZESTRIL) 10 MG tablet TAKE 1 TABLET BY MOUTH ONCE DAILY. (Patient not taking: Reported on 10/13/2017) 90 tablet 2   No current facility-administered medications on file prior to visit.    No Known Allergies Family History  Problem Relation Age of Onset  . Lupus Mother   . CAD Brother   . Hypertension Brother     PE: BP (!) 160/98   Pulse 87   Ht 5' 10"  (1.778 m)   Wt 155 lb 12.8 oz (70.7 kg)   SpO2 97%   BMI 22.35 kg/m  Wt Readings from Last 3 Encounters:  10/13/17 155 lb 12.8 oz (70.7 kg)  10/10/17 156 lb (70.8 kg)  10/02/17 169 lb 12.8 oz (77 kg)   Constitutional: Thin, + temporal wasting, in NAD Eyes: PERRLA, EOMI, no exophthalmos ENT: moist mucous membranes, no thyromegaly, no cervical lymphadenopathy Cardiovascular: RRR, No RG, no M Respiratory: CTA B Gastrointestinal: abdomen soft, NT, ND, BS+ Musculoskeletal: no deformities, strength intact in all 4 Skin: moist, warm, no rashes Neurological: + Mild tremor with outstretched hands, DTR normal in all 4  ASSESSMENT: 1. DM2, non-insulin-dependent, uncontrolled, with complications - CAD - s/p AMI 11/2016 - s/p DES x4 -  Ao stenosis  PLAN:  1. Patient with 2 years of initially controlled diabetes, with deteriorating control around the time of  his pancreatic cancer diagnosis at the end of last year.  He was initially on glipizide, with good control, but after his cancer diagnosis he was started on long-acting insulin.  He was taking 20 units of Tresiba and per his log, his sugars were not controlled on this dose.  Since his last discharge from the hospital, he is not taking any medication for diabetes and not checking his sugars.  Reviewing latest glucose levels from the hospital, his sugars are in the 200s.   - We discussed about the fact that due to his pancreatic cancer diagnosis, we cannot rely on pancreatic insulin production and we have to give him exogenous insulin.  He agrees to restart basal insulin.  We decided to start at his previous dose, and then increase the dose based on a.m. sugars.  At next visit, we will see if he also needs mealtime insulin.  I would not suggest to use glipizide for now due to the sulfonylurea mechanism of action, of stimulating pancreas to put out more insulin. - We also discussed about switching from sugary drinks to at least Glucerna. - I suggested to:  Patient Instructions   Please start: - Basaglar 15 units at bedtime tonight, then increase to 20 units if sugars in am >150. After 3 days, if sugars in am not <150, increase the dose by 3 units. Stop if sugars in am <150 or you reached 30 units.  Please change to Glucerna.  Please let me know if the sugars are consistently <80 or >200.  Please return in 1.5 months with your sugar log.   - Strongly advised him to start checking sugars at different times of the day - check 1-2 times a day, rotating checks - given sugar log and advised how to fill it and to bring it at next appt  - given foot care handout and explained the principles  - given instructions for hypoglycemia management "15-15 rule"  - advised for yearly eye exams  - Return to clinic in 1.5 mo with sugar log   *Blood pressure today is high as he did not take his blood pressure  medicines this morning.  Manuel Kingdom, MD PhD [facility] Endocrinology

## 2017-10-16 ENCOUNTER — Inpatient Hospital Stay: Payer: Medicare Other

## 2017-10-16 ENCOUNTER — Inpatient Hospital Stay: Payer: Medicare Other | Attending: Oncology

## 2017-10-16 ENCOUNTER — Inpatient Hospital Stay: Payer: Medicare Other | Admitting: Nutrition

## 2017-10-16 ENCOUNTER — Telehealth: Payer: Self-pay | Admitting: Oncology

## 2017-10-16 ENCOUNTER — Inpatient Hospital Stay (HOSPITAL_BASED_OUTPATIENT_CLINIC_OR_DEPARTMENT_OTHER): Payer: Medicare Other | Admitting: Oncology

## 2017-10-16 VITALS — BP 160/70 | HR 74 | Temp 97.9°F | Resp 18 | Ht 70.0 in | Wt 156.7 lb

## 2017-10-16 DIAGNOSIS — Z95828 Presence of other vascular implants and grafts: Secondary | ICD-10-CM

## 2017-10-16 DIAGNOSIS — Z87891 Personal history of nicotine dependence: Secondary | ICD-10-CM | POA: Diagnosis not present

## 2017-10-16 DIAGNOSIS — C250 Malignant neoplasm of head of pancreas: Secondary | ICD-10-CM | POA: Diagnosis not present

## 2017-10-16 DIAGNOSIS — Z5111 Encounter for antineoplastic chemotherapy: Secondary | ICD-10-CM | POA: Insufficient documentation

## 2017-10-16 DIAGNOSIS — E119 Type 2 diabetes mellitus without complications: Secondary | ICD-10-CM

## 2017-10-16 DIAGNOSIS — Z5189 Encounter for other specified aftercare: Secondary | ICD-10-CM | POA: Diagnosis not present

## 2017-10-16 DIAGNOSIS — N2889 Other specified disorders of kidney and ureter: Secondary | ICD-10-CM

## 2017-10-16 LAB — BASIC METABOLIC PANEL
Anion gap: 7 (ref 3–11)
BUN: 12 mg/dL (ref 7–26)
CO2: 27 mmol/L (ref 22–29)
Calcium: 9 mg/dL (ref 8.4–10.4)
Chloride: 101 mmol/L (ref 98–109)
Creatinine, Ser: 0.95 mg/dL (ref 0.70–1.30)
GFR calc Af Amer: >60 mL/min (ref >60)
GFR calc non Af Amer: >60 mL/min (ref >60)
Glucose, Bld: 311 mg/dL — ABNORMAL HIGH (ref 70–140)
Potassium: 3.9 mmol/L (ref 3.5–5.1)
Sodium: 135 mmol/L — ABNORMAL LOW (ref 136–145)

## 2017-10-16 MED ORDER — FLUOROURACIL CHEMO INJECTION 5 GM/100ML
1600.0000 mg/m2 | INTRAVENOUS | Status: DC
  Administered 2017-10-16: 3150 mg via INTRAVENOUS
  Filled 2017-10-16: qty 63

## 2017-10-16 MED ORDER — DEXTROSE 5 % IV SOLN
Freq: Once | INTRAVENOUS | Status: AC
  Administered 2017-10-16: 10:00:00 via INTRAVENOUS

## 2017-10-16 MED ORDER — OXALIPLATIN CHEMO INJECTION 100 MG/20ML
85.0000 mg/m2 | Freq: Once | INTRAVENOUS | Status: AC
  Administered 2017-10-16: 165 mg via INTRAVENOUS
  Filled 2017-10-16: qty 33

## 2017-10-16 MED ORDER — LEUCOVORIN CALCIUM INJECTION 350 MG
400.0000 mg/m2 | Freq: Once | INTRAVENOUS | Status: AC
  Administered 2017-10-16: 784 mg via INTRAVENOUS
  Filled 2017-10-16: qty 39.2

## 2017-10-16 MED ORDER — PALONOSETRON HCL INJECTION 0.25 MG/5ML
0.2500 mg | Freq: Once | INTRAVENOUS | Status: AC
  Administered 2017-10-16: 0.25 mg via INTRAVENOUS

## 2017-10-16 MED ORDER — PALONOSETRON HCL INJECTION 0.25 MG/5ML
INTRAVENOUS | Status: AC
  Filled 2017-10-16: qty 5

## 2017-10-16 MED ORDER — SODIUM CHLORIDE 0.9% FLUSH
10.0000 mL | Freq: Once | INTRAVENOUS | Status: AC
  Administered 2017-10-16: 10 mL
  Filled 2017-10-16: qty 10

## 2017-10-16 MED ORDER — FOSAPREPITANT DIMEGLUMINE INJECTION 150 MG
Freq: Once | INTRAVENOUS | Status: AC
  Administered 2017-10-16: 10:00:00 via INTRAVENOUS
  Filled 2017-10-16: qty 5

## 2017-10-16 NOTE — Patient Instructions (Signed)
[facility] Discharge Instructions for Patients Receiving Chemotherapy  Today you received the following chemotherapy agents: Oxaliplatin, Leucovorin, 76f  To help prevent nausea and vomiting after your treatment, we encourage you to take your nausea medication as prescribed    If you develop nausea and vomiting that is not controlled by your nausea medication, call the clinic.   BELOW ARE SYMPTOMS THAT SHOULD BE REPORTED IMMEDIATELY:  *FEVER GREATER THAN 100.5 F  *CHILLS WITH OR WITHOUT FEVER  NAUSEA AND VOMITING THAT IS NOT CONTROLLED WITH YOUR NAUSEA MEDICATION  *UNUSUAL SHORTNESS OF BREATH  *UNUSUAL BRUISING OR BLEEDING  TENDERNESS IN MOUTH AND THROAT WITH OR WITHOUT PRESENCE OF ULCERS  *URINARY PROBLEMS  *BOWEL PROBLEMS  UNUSUAL RASH Items with * indicate a potential emergency and should be followed up as soon as possible.  Feel free to call the clinic should you have any questions or concerns. The clinic phone number is [phone].  Please [facility] the [facility] check-[facility] to the [facility] Department and triage nurse.

## 2017-10-16 NOTE — Telephone Encounter (Signed)
Scheduled appt per 2/4 los - gave patient AVS and calender per los.

## 2017-10-16 NOTE — Progress Notes (Signed)
Nutrition follow-up completed with patient receiving treatment for pancreas cancer. Patient's weight has decreased and was documented as 156.7 pounds on February 4 decreased from 169.8 pounds on January 21. Noted glucose 311 Patient reports he has not been able to begin testing his sugars yet. Reports he feels much better than before and he is eating more often.  Nutrition diagnosis: Food and nutrition related knowledge deficit improved  Intervention: Educated patient to increase calories and protein and small frequent meals and snacks Encouraged increased fluid intake Enforced the importance of no concentrated sweets diet Questions answered teach back method used  Monitoring evaluation goals: Patient will tolerate adequate calories and protein to minimize further weight loss  Next visit: Monday, February 18.  **Disclaimer: This note was dictated with voice recognition software. Similar sounding words can inadvertently be transcribed and this note may contain transcription errors which may not have been corrected upon publication of note.**

## 2017-10-16 NOTE — Progress Notes (Signed)
Ok to treat with CBC from 1/29 and BMP from today per Dr. Benay Spice

## 2017-10-16 NOTE — Progress Notes (Signed)
[place] OFFICE PROGRESS NOTE   Diagnosis: Pancreas cancer  INTERVAL HISTORY:  Manuel Olson returns as scheduled.  He reports feeling well.  No nausea, abdominal pain, or diarrhea.  He was started on insulin for diabetes, but did not fill the prescription.  He plans to fill the prescription today.  No neuropathy symptoms.  The extremities feel "cold ".  Objective:  Vital signs in last 24 hours:  Blood pressure (!) 160/70, pulse 74, temperature 97.9 F (36.6 C), temperature source Oral, resp. rate 18, height 5' 10"  (1.778 m), weight 156 lb 11.2 oz (71.1 kg), SpO2 98 %.    HEENT: No thrush, small ecchymosis at the posterior left buccal mucosa Resp: Lungs clear bilaterally Cardio: Regular rate and rhythm, 2/6 diastolic murmur GI: No hepatomegaly, no mass, nontender Vascular: No leg edema  Skin: Palms without erythema  Portacath/PICC-without erythema  Lab Results:  Lab Results  Component Value Date   WBC 9.1 10/10/2017   HGB 10.7 (L) 10/10/2017   HCT 32.4 (L) 10/10/2017   MCV 89.9 10/10/2017   PLT 244 10/10/2017   NEUTROABS 7.1 (H) 10/10/2017    CMP     Component Value Date/Time   NA 135 (L) 10/16/2017 0754   NA 139 09/07/2017 0844   K 3.9 10/16/2017 0754   K 5.4 No visable hemolysis (H) 09/07/2017 0844   CL 101 10/16/2017 0754   CO2 27 10/16/2017 0754   CO2 27 09/07/2017 0844   GLUCOSE 311 (H) 10/16/2017 0754   GLUCOSE 227 (H) 09/07/2017 0844   BUN 12 10/16/2017 0754   BUN 29.8 (H) 09/07/2017 0844   CREATININE 0.95 10/16/2017 0754   CREATININE 1.2 09/07/2017 0844   CALCIUM 9.0 10/16/2017 0754   CALCIUM 9.8 09/07/2017 0844   PROT 5.6 (L) 10/10/2017 0958   PROT 6.9 09/07/2017 0844   ALBUMIN 2.8 (L) 10/10/2017 0958   ALBUMIN 3.9 09/07/2017 0844   AST 18 10/10/2017 0958   AST 12 09/25/2017 1301   AST 13 09/07/2017 0844   ALT 20 10/10/2017 0958   ALT 26 09/25/2017 1301   ALT 12 09/07/2017 0844   ALKPHOS 70 10/10/2017 0958   ALKPHOS 89  09/07/2017 0844   BILITOT 0.3 10/10/2017 0958   BILITOT 0.3 09/25/2017 1301   BILITOT 0.37 09/07/2017 0844   GFRNONAA >60 10/16/2017 0754   GFRNONAA >60 09/25/2017 1301   GFRAA >60 10/16/2017 0754   GFRAA >60 09/25/2017 1301    Medications: I have reviewed the patient's current medications.   Assessment/Plan: 1. Adenocarcinoma of the pancreas head/neck, status post an EUS biopsy 08/02/2017 confirming adenocarcinoma ? EUS 08/02/2017-pancreas head/neck mass, abutment of the portal vein, no peripancreatic adenopathy ? MRI abdomen 07/27/2017-head/neck pancreas mass with mass-effect on the portal splenic confluence, no involvement of the celiac axis or superior mesenteric artery, no evidence of metastatic disease ? PET scan 08/31/2017-very hypermetabolism at the pancreatic head mass, no evidence of metastatic disease. The isolated right lower lobe nodule below PET resolution, left sided renal mass with decreased activity relative to the normal adjacent kidney ? Cycle 1 FOLFIRINOX 09/16/2017 ? Cycle 2 FOLFOX 10/16/2017  2. 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 Barry Dienes  9. Nausea, diarrhea, and abdominal pain- chemotherapy enteritis versus bowel  obstruction versus ischemic enteritis  10. Dehydrationsecondary to nausea and diarrhea-resolved  11. Pancytopenia secondary to chemotherapy- improved  Disposition: He has a much improved performance status.  The plan is to proceed with cycle 2 chemotherapy today.  Irinotecan will be held from the chemotherapy with this cycle.  The 5-fluorouracil will be dose reduced.  I encouraged him to avoid concentrated  sweets and begin the diabetic regimen as prescribed.  He will check his blood sugar at home.  Mr. Calix no contact us for diarrhea following the cycle of chemotherapy.  I dose reduce the Decadron secondary to his history of hiccups.  He will use baclofen as needed for hiccups.  25 minutes were spent with the patient today.  The majority of the time was used for counseling and coordination of care.  [name], MD  10/16/2017  9:25 AM

## 2017-10-18 ENCOUNTER — Inpatient Hospital Stay: Payer: Medicare Other

## 2017-10-18 VITALS — BP 142/64 | HR 72 | Temp 98.2°F | Resp 18

## 2017-10-18 DIAGNOSIS — C250 Malignant neoplasm of head of pancreas: Secondary | ICD-10-CM | POA: Diagnosis not present

## 2017-10-18 DIAGNOSIS — Z5111 Encounter for antineoplastic chemotherapy: Secondary | ICD-10-CM | POA: Diagnosis not present

## 2017-10-18 DIAGNOSIS — Z5189 Encounter for other specified aftercare: Secondary | ICD-10-CM | POA: Diagnosis not present

## 2017-10-18 MED ORDER — PEGFILGRASTIM INJECTION 6 MG/0.6ML ~~LOC~~
6.0000 mg | PREFILLED_SYRINGE | Freq: Once | SUBCUTANEOUS | Status: AC
  Administered 2017-10-18: 6 mg via SUBCUTANEOUS

## 2017-10-18 MED ORDER — HEPARIN SOD (PORK) LOCK FLUSH 100 UNIT/ML IV SOLN
500.0000 [IU] | Freq: Once | INTRAVENOUS | Status: AC | PRN
  Administered 2017-10-18: 500 [IU]
  Filled 2017-10-18: qty 5

## 2017-10-18 MED ORDER — SODIUM CHLORIDE 0.9% FLUSH
10.0000 mL | INTRAVENOUS | Status: DC | PRN
  Administered 2017-10-18: 10 mL
  Filled 2017-10-18: qty 10

## 2017-10-18 NOTE — Patient Instructions (Signed)
Pegfilgrastim injection What is this medicine? PEGFILGRASTIM (PEG fil gra stim) is a long-acting granulocyte colony-stimulating factor that stimulates the growth of neutrophils, a type of white blood cell important in the body's fight against infection. It is used to reduce the incidence of fever and infection in patients with certain types of cancer who are receiving chemotherapy that affects the bone marrow, and to increase survival after being exposed to high doses of radiation. This medicine may be used for other purposes; ask your health care provider or pharmacist if you have questions. COMMON BRAND NAME(S): Neulasta What should I tell my health care provider before I take this medicine? They need to know if you have any of these conditions: -kidney disease -latex allergy -ongoing radiation therapy -sickle cell disease -skin reactions to acrylic adhesives (On-Body Injector only) -an unusual or allergic reaction to pegfilgrastim, filgrastim, other medicines, foods, dyes, or preservatives -pregnant or trying to get pregnant -breast-feeding How should I use this medicine? This medicine is for injection under the skin. If you get this medicine at home, you will be taught how to prepare and give the pre-filled syringe or how to use the On-body Injector. Refer to the patient Instructions for Use for detailed instructions. Use exactly as directed. Tell your healthcare provider immediately if you suspect that the On-body Injector may not have performed as intended or if you suspect the use of the On-body Injector resulted in a missed or partial dose. It is important that you put your used needles and syringes in a special sharps container. Do not put them in a trash can. If you do not have a sharps container, call your pharmacist or healthcare provider to get one. Talk to your pediatrician regarding the use of this medicine in children. While this drug may be prescribed for selected conditions,  precautions do apply. Overdosage: If you think you have taken too much of this medicine contact a poison control center or emergency room at once. NOTE: This medicine is only for you. Do not share this medicine with others. What if I miss a dose? It is important not to miss your dose. Call your doctor or health care professional if you miss your dose. If you miss a dose due to an On-body Injector failure or leakage, a new dose should be administered as soon as possible using a single prefilled syringe for manual use. What may interact with this medicine? Interactions have not been studied. Give your health care provider a list of all the medicines, herbs, non-prescription drugs, or dietary supplements you use. Also tell them if you smoke, drink alcohol, or use illegal drugs. Some items may interact with your medicine. This list may not describe all possible interactions. Give your health care provider a list of all the medicines, herbs, non-prescription drugs, or dietary supplements you use. Also tell them if you smoke, drink alcohol, or use illegal drugs. Some items may interact with your medicine. What should I watch for while using this medicine? You may need blood work done while you are taking this medicine. If you are going to need a MRI, CT scan, or other procedure, tell your doctor that you are using this medicine (On-Body Injector only). What side effects may I notice from receiving this medicine? Side effects that you should report to your doctor or health care professional as soon as possible: -allergic reactions like skin rash, itching or hives, swelling of the face, lips, or tongue -dizziness -fever -pain, redness, or irritation at site  where injected -pinpoint red spots on the skin -red or dark-brown urine -shortness of breath or breathing problems -stomach or side pain, or pain at the shoulder -swelling -tiredness -trouble passing urine or change in the amount of urine Side  effects that usually do not require medical attention (report to your doctor or health care professional if they continue or are bothersome): -bone pain -muscle pain This list may not describe all possible side effects. Call your doctor for medical advice about side effects. You may report side effects to FDA at 1-800-FDA-1088. Where should I keep my medicine? Keep out of the reach of children. Store pre-filled syringes in a refrigerator between 2 and 8 degrees C (36 and 46 degrees F). Do not freeze. Keep in carton to protect from light. Throw away this medicine if it is left out of the refrigerator for more than 48 hours. Throw away any unused medicine after the expiration date. NOTE: This sheet is a summary. It may not cover all possible information. If you have questions about this medicine, talk to your doctor, pharmacist, or health care provider.  2018 Elsevier/Gold Standard (2016-08-25 12:58:03)

## 2017-10-19 ENCOUNTER — Encounter: Payer: Medicare Other | Admitting: Genetics

## 2017-10-19 ENCOUNTER — Other Ambulatory Visit: Payer: Medicare Other

## 2017-10-20 ENCOUNTER — Other Ambulatory Visit: Payer: Self-pay | Admitting: *Deleted

## 2017-10-20 ENCOUNTER — Encounter: Payer: Self-pay | Admitting: Cardiology

## 2017-10-20 MED ORDER — CARVEDILOL 3.125 MG PO TABS: ORAL_TABLET | ORAL | 2 refills | Status: DC

## 2017-10-20 MED ORDER — LISINOPRIL 10 MG PO TABS: 10.0000 mg | ORAL_TABLET | Freq: Every day | ORAL | 2 refills | Status: DC

## 2017-10-20 MED ORDER — CLOPIDOGREL BISULFATE 75 MG PO TABS: 75.0000 mg | ORAL_TABLET | Freq: Every day | ORAL | 1 refills | Status: DC

## 2017-10-29 ENCOUNTER — Other Ambulatory Visit: Payer: Self-pay | Admitting: Oncology

## 2017-10-30 ENCOUNTER — Inpatient Hospital Stay: Payer: Medicare Other

## 2017-10-30 ENCOUNTER — Inpatient Hospital Stay (HOSPITAL_BASED_OUTPATIENT_CLINIC_OR_DEPARTMENT_OTHER): Payer: Medicare Other | Admitting: Nurse Practitioner

## 2017-10-30 ENCOUNTER — Encounter: Payer: Self-pay | Admitting: Nurse Practitioner

## 2017-10-30 ENCOUNTER — Encounter: Payer: Medicare Other | Admitting: Nutrition

## 2017-10-30 ENCOUNTER — Telehealth: Payer: Self-pay

## 2017-10-30 VITALS — BP 149/73 | HR 70 | Temp 97.6°F | Resp 18 | Ht 70.0 in | Wt 155.5 lb

## 2017-10-30 DIAGNOSIS — C250 Malignant neoplasm of head of pancreas: Secondary | ICD-10-CM

## 2017-10-30 DIAGNOSIS — N289 Disorder of kidney and ureter, unspecified: Secondary | ICD-10-CM

## 2017-10-30 DIAGNOSIS — D72829 Elevated white blood cell count, unspecified: Secondary | ICD-10-CM | POA: Diagnosis not present

## 2017-10-30 DIAGNOSIS — H0016 Chalazion left eye, unspecified eyelid: Secondary | ICD-10-CM

## 2017-10-30 DIAGNOSIS — E119 Type 2 diabetes mellitus without complications: Secondary | ICD-10-CM | POA: Diagnosis not present

## 2017-10-30 DIAGNOSIS — Z95828 Presence of other vascular implants and grafts: Secondary | ICD-10-CM

## 2017-10-30 DIAGNOSIS — I10 Essential (primary) hypertension: Secondary | ICD-10-CM

## 2017-10-30 DIAGNOSIS — Z5189 Encounter for other specified aftercare: Secondary | ICD-10-CM | POA: Diagnosis not present

## 2017-10-30 DIAGNOSIS — Z5111 Encounter for antineoplastic chemotherapy: Secondary | ICD-10-CM | POA: Diagnosis not present

## 2017-10-30 LAB — CBC WITH DIFFERENTIAL (CANCER CENTER ONLY)
Basophils Absolute: 0.1 10*3/uL (ref 0.0–0.1)
Basophils Relative: 0 %
Eosinophils Absolute: 0.3 10*3/uL (ref 0.0–0.5)
Eosinophils Relative: 2 %
HCT: 32.9 % — ABNORMAL LOW (ref 38.4–49.9)
Hemoglobin: 10.6 g/dL — ABNORMAL LOW (ref 13.0–17.1)
Lymphocytes Relative: 7 %
Lymphs Abs: 1.2 10*3/uL (ref 0.9–3.3)
MCH: 29.7 pg (ref 27.2–33.4)
MCHC: 32.3 g/dL (ref 32.0–36.0)
MCV: 91.7 fL (ref 79.3–98.0)
Monocytes Absolute: 1.4 10*3/uL — ABNORMAL HIGH (ref 0.1–0.9)
Monocytes Relative: 8 %
Neutro Abs: 14.3 10*3/uL — ABNORMAL HIGH (ref 1.5–6.5)
Neutrophils Relative %: 83 %
Platelet Count: 134 10*3/uL — ABNORMAL LOW (ref 140–400)
RBC: 3.58 MIL/uL — ABNORMAL LOW (ref 4.20–5.82)
RDW: 17 % — ABNORMAL HIGH (ref 11.0–14.6)
WBC Count: 17.3 10*3/uL — ABNORMAL HIGH (ref 4.0–10.3)

## 2017-10-30 LAB — CMP (CANCER CENTER ONLY)
ALT: 7 U/L (ref 0–55)
AST: 11 U/L (ref 5–34)
Albumin: 3.4 g/dL — ABNORMAL LOW (ref 3.5–5.0)
Alkaline Phosphatase: 164 U/L — ABNORMAL HIGH (ref 40–150)
Anion gap: 8 (ref 3–11)
BUN: 14 mg/dL (ref 7–26)
CO2: 24 mmol/L (ref 22–29)
Calcium: 9.3 mg/dL (ref 8.4–10.4)
Chloride: 102 mmol/L (ref 98–109)
Creatinine: 0.95 mg/dL (ref 0.70–1.30)
GFR, Est AFR Am: >60 mL/min (ref >60)
GFR, Estimated: >60 mL/min (ref >60)
Glucose, Bld: 287 mg/dL — ABNORMAL HIGH (ref 70–140)
Potassium: 4.1 mmol/L (ref 3.5–5.1)
Sodium: 134 mmol/L — ABNORMAL LOW (ref 136–145)
Total Bilirubin: 0.3 mg/dL (ref 0.2–1.2)
Total Protein: 6.2 g/dL — ABNORMAL LOW (ref 6.4–8.3)

## 2017-10-30 MED ORDER — PALONOSETRON HCL INJECTION 0.25 MG/5ML
INTRAVENOUS | Status: AC
  Filled 2017-10-30: qty 5

## 2017-10-30 MED ORDER — LEUCOVORIN CALCIUM INJECTION 350 MG
400.0000 mg/m2 | Freq: Once | INTRAVENOUS | Status: AC
  Administered 2017-10-30: 784 mg via INTRAVENOUS
  Filled 2017-10-30: qty 39.2

## 2017-10-30 MED ORDER — OXALIPLATIN CHEMO INJECTION 100 MG/20ML
85.0000 mg/m2 | Freq: Once | INTRAVENOUS | Status: AC
  Administered 2017-10-30: 165 mg via INTRAVENOUS
  Filled 2017-10-30: qty 33

## 2017-10-30 MED ORDER — SODIUM CHLORIDE 0.9% FLUSH
10.0000 mL | INTRAVENOUS | Status: DC | PRN
  Filled 2017-10-30: qty 10

## 2017-10-30 MED ORDER — PALONOSETRON HCL INJECTION 0.25 MG/5ML
0.2500 mg | Freq: Once | INTRAVENOUS | Status: AC
  Administered 2017-10-30: 0.25 mg via INTRAVENOUS

## 2017-10-30 MED ORDER — SODIUM CHLORIDE 0.9 % IV SOLN
Freq: Once | INTRAVENOUS | Status: AC
  Administered 2017-10-30: 12:00:00 via INTRAVENOUS
  Filled 2017-10-30: qty 5

## 2017-10-30 MED ORDER — HEPARIN SOD (PORK) LOCK FLUSH 100 UNIT/ML IV SOLN
500.0000 [IU] | Freq: Once | INTRAVENOUS | Status: DC | PRN
  Filled 2017-10-30: qty 5

## 2017-10-30 MED ORDER — SODIUM CHLORIDE 0.9% FLUSH
10.0000 mL | Freq: Once | INTRAVENOUS | Status: AC
  Administered 2017-10-30: 10 mL
  Filled 2017-10-30: qty 10

## 2017-10-30 MED ORDER — SODIUM CHLORIDE 0.9 % IV SOLN
1600.0000 mg/m2 | INTRAVENOUS | Status: DC
  Administered 2017-10-30: 3150 mg via INTRAVENOUS
  Filled 2017-10-30: qty 63

## 2017-10-30 MED ORDER — DEXTROSE 5 % IV SOLN
Freq: Once | INTRAVENOUS | Status: AC
  Administered 2017-10-30: 11:00:00 via INTRAVENOUS

## 2017-10-30 NOTE — Progress Notes (Signed)
  [place] OFFICE PROGRESS NOTE   Diagnosis: Pancreas cancer  INTERVAL HISTORY:   Manuel Olson returns as scheduled.  He completed a cycle of FOLFOX 10/16/2017.  He denies nausea/vomiting.  No mouth sores.  No diarrhea.  Cold sensitivity lasted 3-4 days.  No persistent neuropathy symptoms.  He has pain associated with a stye involving the left eye.  No other pain.  Objective:  Vital signs in last 24 hours:  Blood pressure (!) 149/73, pulse 70, temperature 97.6 F (36.4 C), temperature source Oral, resp. rate 18, height 5' 10"  (1.778 m), weight 155 lb 8 oz (70.5 kg), SpO2 98 %.    HEENT: No thrush or ulcers.  Stye at the left lower lid margin. Resp: Lungs clear bilaterally. Cardio: Regular rate and rhythm. GI: Abdomen soft and nontender.  No hepatomegaly. Vascular: No leg edema.  Port-A-Cath without erythema.  Lab Results:  Lab Results  Component Value Date   WBC 17.3 (H) 10/30/2017   HGB 10.7 (L) 10/10/2017   HCT 32.9 (L) 10/30/2017   MCV 91.7 10/30/2017   PLT 134 (L) 10/30/2017   NEUTROABS 14.3 (H) 10/30/2017    Imaging:  No results found.  Medications: I have reviewed the patient's current medications.  Assessment/Plan: 1. Adenocarcinoma of the pancreas head/neck, status post an EUS biopsy 08/02/2017 confirming adenocarcinoma ? EUS 08/02/2017-pancreas head/neck mass, abutment of the portal vein, no peripancreatic adenopathy ? MRI abdomen 07/27/2017-head/neck pancreas mass with mass-effect on the portal splenic confluence, no involvement of the celiac axis or superior mesenteric artery, no evidence of metastatic disease ? PET scan 08/31/2017-very hypermetabolism at the pancreatic head mass, no evidence of metastatic disease. The isolated right lower lobe nodule below PET resolution, left sided renal mass with decreased activity relative to the normal adjacent kidney ? Cycle 1 FOLFIRINOX 09/16/2017 ? Cycle 2 FOLFOX 10/16/2017 ? Cycle 3 FOLFOX  10/30/2017  2. 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 Barry Dienes  9. Nausea, diarrhea, and abdominal pain-chemotherapy enteritis versus bowel obstruction versus ischemic enteritis  10. Dehydrationsecondary to nausea and diarrhea-resolved  11. Pancytopenia secondary to chemotherapy-improved     Disposition: Manuel Olson appears stable.  He completed cycle 2 FOLFOX 2 weeks ago.  He tolerated well.  Plan to proceed with cycle 3 FOLFOX today as scheduled.  Neulasta will be held with this cycle due to the elevated white count.  Consideration will be given to resuming irinotecan when he returns in 2 weeks.  We will see him in follow-up on 11/13/2017.  He will contact the office in the interim with any problems.  Plan reviewed with Dr. Benay Spice.    [name] ANP/GNP-BC   10/30/2017  10:22 AM

## 2017-10-30 NOTE — Telephone Encounter (Signed)
Patient will get print out inn infusion. Per 2/18 los. Journalist, newspaper. Checking on the statis of scheduling on 3/5 instead of 3/4.

## 2017-10-30 NOTE — Patient Instructions (Signed)
Manuel Olson Discharge Instructions for Patients Receiving Chemotherapy  Today you received the following chemotherapy agents: Oxaliplatin, Leucovorin, 51f  To help prevent nausea and vomiting after your treatment, we encourage you to take your nausea medication as prescribed    If you develop nausea and vomiting that is not controlled by your nausea medication, call the clinic.   BELOW ARE SYMPTOMS THAT SHOULD BE REPORTED IMMEDIATELY:  *FEVER GREATER THAN 100.5 F  *CHILLS WITH OR WITHOUT FEVER  NAUSEA AND VOMITING THAT IS NOT CONTROLLED WITH YOUR NAUSEA MEDICATION  *UNUSUAL SHORTNESS OF BREATH  *UNUSUAL BRUISING OR BLEEDING  TENDERNESS IN MOUTH AND THROAT WITH OR WITHOUT PRESENCE OF ULCERS  *URINARY PROBLEMS  *BOWEL PROBLEMS  UNUSUAL RASH Items with * indicate a potential emergency and should be followed up as soon as possible.  Feel free to call the clinic should you have any questions or concerns. The clinic phone number is [phone].  Please show the [place] check-in to the Emergency Department and triage nurse.

## 2017-10-30 NOTE — Addendum Note (Signed)
Addended by: [name] B on: 10/30/2017 10:42 AM   Modules accepted: Orders

## 2017-11-01 ENCOUNTER — Inpatient Hospital Stay: Payer: Medicare Other

## 2017-11-01 VITALS — BP 145/75 | HR 75 | Temp 98.2°F | Resp 18

## 2017-11-01 DIAGNOSIS — Z5189 Encounter for other specified aftercare: Secondary | ICD-10-CM | POA: Diagnosis not present

## 2017-11-01 DIAGNOSIS — C250 Malignant neoplasm of head of pancreas: Secondary | ICD-10-CM

## 2017-11-01 DIAGNOSIS — Z5111 Encounter for antineoplastic chemotherapy: Secondary | ICD-10-CM | POA: Diagnosis not present

## 2017-11-01 MED ORDER — HEPARIN SOD (PORK) LOCK FLUSH 100 UNIT/ML IV SOLN
500.0000 [IU] | Freq: Once | INTRAVENOUS | Status: AC | PRN
  Administered 2017-11-01: 500 [IU]
  Filled 2017-11-01: qty 5

## 2017-11-01 MED ORDER — SODIUM CHLORIDE 0.9% FLUSH
10.0000 mL | INTRAVENOUS | Status: DC | PRN
  Administered 2017-11-01: 10 mL
  Filled 2017-11-01: qty 10

## 2017-11-03 ENCOUNTER — Telehealth: Payer: Self-pay

## 2017-11-03 MED ORDER — INSULIN ASPART 100 UNIT/ML FLEXPEN: 5.0000 [IU] | PEN_INJECTOR | Freq: Three times a day (TID) | SUBCUTANEOUS | 0 refills | Status: DC

## 2017-11-03 NOTE — Telephone Encounter (Signed)
Pts wife dropped off glucose readings and was concerned about how they go into the 200s throughout the day. Dr. Cruzita Lederer states that pt can continue Basaglar at 20 units. She requests he does 5 units of Novolog

## 2017-11-03 NOTE — Telephone Encounter (Signed)
Pts wife dropped off glucose readings and was concerned about how they go into the 200s throughout the day. Dr. Cruzita Lederer states that pt can continue Basaglar at 20 units. She requests he does 5 units of Novolog before each meal. 15 min before. She wants the logs dropped off again in 2 weeks. Wife is aware and agrees

## 2017-11-09 ENCOUNTER — Other Ambulatory Visit: Payer: Self-pay | Admitting: *Deleted

## 2017-11-09 MED ORDER — ATORVASTATIN CALCIUM 20 MG PO TABS: 20.0000 mg | ORAL_TABLET | Freq: Every day | ORAL | 1 refills | Status: DC

## 2017-11-11 ENCOUNTER — Other Ambulatory Visit: Payer: Self-pay | Admitting: Oncology

## 2017-11-14 ENCOUNTER — Inpatient Hospital Stay: Payer: Medicare Other | Admitting: Nutrition

## 2017-11-14 ENCOUNTER — Telehealth: Payer: Self-pay | Admitting: Oncology

## 2017-11-14 ENCOUNTER — Inpatient Hospital Stay: Payer: Medicare Other

## 2017-11-14 ENCOUNTER — Inpatient Hospital Stay: Payer: Medicare Other | Attending: Oncology

## 2017-11-14 ENCOUNTER — Inpatient Hospital Stay (HOSPITAL_BASED_OUTPATIENT_CLINIC_OR_DEPARTMENT_OTHER): Payer: Medicare Other | Admitting: Oncology

## 2017-11-14 ENCOUNTER — Ambulatory Visit: Payer: Medicare Other | Admitting: Nurse Practitioner

## 2017-11-14 VITALS — BP 163/82 | HR 59

## 2017-11-14 VITALS — BP 182/82 | HR 66 | Temp 97.6°F | Resp 18 | Ht 70.0 in | Wt 156.1 lb

## 2017-11-14 DIAGNOSIS — I2510 Atherosclerotic heart disease of native coronary artery without angina pectoris: Secondary | ICD-10-CM | POA: Diagnosis not present

## 2017-11-14 DIAGNOSIS — Z5111 Encounter for antineoplastic chemotherapy: Secondary | ICD-10-CM | POA: Insufficient documentation

## 2017-11-14 DIAGNOSIS — C250 Malignant neoplasm of head of pancreas: Secondary | ICD-10-CM | POA: Diagnosis not present

## 2017-11-14 DIAGNOSIS — I10 Essential (primary) hypertension: Secondary | ICD-10-CM

## 2017-11-14 DIAGNOSIS — Z95828 Presence of other vascular implants and grafts: Secondary | ICD-10-CM

## 2017-11-14 DIAGNOSIS — Z87891 Personal history of nicotine dependence: Secondary | ICD-10-CM

## 2017-11-14 DIAGNOSIS — I252 Old myocardial infarction: Secondary | ICD-10-CM | POA: Insufficient documentation

## 2017-11-14 DIAGNOSIS — I350 Nonrheumatic aortic (valve) stenosis: Secondary | ICD-10-CM | POA: Insufficient documentation

## 2017-11-14 DIAGNOSIS — N2889 Other specified disorders of kidney and ureter: Secondary | ICD-10-CM

## 2017-11-14 DIAGNOSIS — E119 Type 2 diabetes mellitus without complications: Secondary | ICD-10-CM | POA: Diagnosis not present

## 2017-11-14 DIAGNOSIS — Z5189 Encounter for other specified aftercare: Secondary | ICD-10-CM | POA: Insufficient documentation

## 2017-11-14 LAB — CBC WITH DIFFERENTIAL (CANCER CENTER ONLY)
Basophils Absolute: 0 10*3/uL (ref 0.0–0.1)
Basophils Relative: 0 %
Eosinophils Absolute: 0.2 10*3/uL (ref 0.0–0.5)
Eosinophils Relative: 3 %
HCT: 32.5 % — ABNORMAL LOW (ref 38.4–49.9)
Hemoglobin: 10.6 g/dL — ABNORMAL LOW (ref 13.0–17.1)
Lymphocytes Relative: 18 %
Lymphs Abs: 1.3 10*3/uL (ref 0.9–3.3)
MCH: 30.3 pg (ref 27.2–33.4)
MCHC: 32.6 g/dL (ref 32.0–36.0)
MCV: 92.9 fL (ref 79.3–98.0)
Monocytes Absolute: 1.4 10*3/uL — ABNORMAL HIGH (ref 0.1–0.9)
Monocytes Relative: 20 %
Neutro Abs: 4.3 10*3/uL (ref 1.5–6.5)
Neutrophils Relative %: 59 %
Platelet Count: 128 10*3/uL — ABNORMAL LOW (ref 140–400)
RBC: 3.5 MIL/uL — ABNORMAL LOW (ref 4.20–5.82)
RDW: 16.3 % — ABNORMAL HIGH (ref 11.0–14.6)
WBC Count: 7.2 10*3/uL (ref 4.0–10.3)

## 2017-11-14 LAB — CMP (CANCER CENTER ONLY)
ALT: 10 U/L (ref 0–55)
AST: 14 U/L (ref 5–34)
Albumin: 3.5 g/dL (ref 3.5–5.0)
Alkaline Phosphatase: 95 U/L (ref 40–150)
Anion gap: 7 (ref 3–11)
BUN: 15 mg/dL (ref 7–26)
CO2: 24 mmol/L (ref 22–29)
Calcium: 9.5 mg/dL (ref 8.4–10.4)
Chloride: 107 mmol/L (ref 98–109)
Creatinine: 0.8 mg/dL (ref 0.70–1.30)
GFR, Est AFR Am: >60 mL/min (ref >60)
GFR, Estimated: >60 mL/min (ref >60)
Glucose, Bld: 95 mg/dL (ref 70–140)
Potassium: 3.9 mmol/L (ref 3.5–5.1)
Sodium: 138 mmol/L (ref 136–145)
Total Bilirubin: 0.3 mg/dL (ref 0.2–1.2)
Total Protein: 6.4 g/dL (ref 6.4–8.3)

## 2017-11-14 MED ORDER — PALONOSETRON HCL INJECTION 0.25 MG/5ML
INTRAVENOUS | Status: AC
  Filled 2017-11-14: qty 5

## 2017-11-14 MED ORDER — ATROPINE SULFATE 1 MG/ML IJ SOLN
0.5000 mg | Freq: Once | INTRAMUSCULAR | Status: AC | PRN
  Administered 2017-11-14: 0.5 mg via INTRAVENOUS

## 2017-11-14 MED ORDER — ATROPINE SULFATE 1 MG/ML IJ SOLN
INTRAMUSCULAR | Status: AC
  Filled 2017-11-14: qty 1

## 2017-11-14 MED ORDER — SODIUM CHLORIDE 0.9 % IV SOLN
75.0000 mg/m2 | Freq: Once | INTRAVENOUS | Status: AC
  Administered 2017-11-14: 140 mg via INTRAVENOUS
  Filled 2017-11-14: qty 7

## 2017-11-14 MED ORDER — SODIUM CHLORIDE 0.9 % IV SOLN
Freq: Once | INTRAVENOUS | Status: AC
  Administered 2017-11-14: 11:00:00 via INTRAVENOUS
  Filled 2017-11-14: qty 5

## 2017-11-14 MED ORDER — SODIUM CHLORIDE 0.9% FLUSH
10.0000 mL | Freq: Once | INTRAVENOUS | Status: AC
  Administered 2017-11-14: 10 mL
  Filled 2017-11-14: qty 10

## 2017-11-14 MED ORDER — OXALIPLATIN CHEMO INJECTION 100 MG/20ML
85.0000 mg/m2 | Freq: Once | INTRAVENOUS | Status: AC
  Administered 2017-11-14: 160 mg via INTRAVENOUS
  Filled 2017-11-14: qty 32

## 2017-11-14 MED ORDER — SODIUM CHLORIDE 0.9 % IV SOLN
400.0000 mg/m2 | Freq: Once | INTRAVENOUS | Status: AC
  Administered 2017-11-14: 748 mg via INTRAVENOUS
  Filled 2017-11-14: qty 37.4

## 2017-11-14 MED ORDER — DEXTROSE 5 % IV SOLN
Freq: Once | INTRAVENOUS | Status: AC
  Administered 2017-11-14: 11:00:00 via INTRAVENOUS

## 2017-11-14 MED ORDER — SODIUM CHLORIDE 0.9% FLUSH
10.0000 mL | INTRAVENOUS | Status: DC | PRN
  Filled 2017-11-14: qty 10

## 2017-11-14 MED ORDER — HEPARIN SOD (PORK) LOCK FLUSH 100 UNIT/ML IV SOLN
500.0000 [IU] | Freq: Once | INTRAVENOUS | Status: DC | PRN
  Filled 2017-11-14: qty 5

## 2017-11-14 MED ORDER — SODIUM CHLORIDE 0.9 % IV SOLN
1600.0000 mg/m2 | INTRAVENOUS | Status: DC
  Administered 2017-11-14: 3000 mg via INTRAVENOUS
  Filled 2017-11-14: qty 60

## 2017-11-14 MED ORDER — PALONOSETRON HCL INJECTION 0.25 MG/5ML
0.2500 mg | Freq: Once | INTRAVENOUS | Status: AC
  Administered 2017-11-14: 0.25 mg via INTRAVENOUS

## 2017-11-14 NOTE — Patient Instructions (Signed)
[facility] Discharge Instructions for Patients Receiving Chemotherapy  Today you received the following chemotherapy agents: Oxaliplatin, Leucovorin, Irinotecan, 47f  To help prevent nausea and vomiting after your treatment, we encourage you to take your nausea medication as prescribed    If you develop nausea and vomiting that is not controlled by your nausea medication, call the clinic.   BELOW ARE SYMPTOMS THAT SHOULD BE REPORTED IMMEDIATELY:  *FEVER GREATER THAN 100.5 F  *CHILLS WITH OR WITHOUT FEVER  NAUSEA AND VOMITING THAT IS NOT CONTROLLED WITH YOUR NAUSEA MEDICATION  *UNUSUAL SHORTNESS OF BREATH  *UNUSUAL BRUISING OR BLEEDING  TENDERNESS IN MOUTH AND THROAT WITH OR WITHOUT PRESENCE OF ULCERS  *URINARY PROBLEMS  *BOWEL PROBLEMS  UNUSUAL RASH Items with * indicate a potential emergency and should be followed up as soon as possible.  Feel free to call the clinic should you have any questions or concerns. The clinic phone number is [phone].  Please show the [place] check-in to the Emergency Department and triage nurse.

## 2017-11-14 NOTE — Progress Notes (Signed)
[place] OFFICE PROGRESS NOTE   Diagnosis: Pancreas cancer  INTERVAL HISTORY:   Mr. Mesa returns as scheduled.  He completed another treatment with FOLFOX on 10/30/2017.  He tolerated the chemotherapy well.  No nausea, mouth sores, or diarrhea.  He has persistent cold sensitivity.  No other neuropathy symptoms.  The blood sugar is under good control.  Objective:  Vital signs in last 24 hours:  Blood pressure (!) 182/82, pulse 66, temperature 97.6 F (36.4 C), temperature source Oral, resp. rate 18, height 5' 10"  (1.778 m), weight 156 lb 1.6 oz (70.8 kg), SpO2 100 %.    HEENT: No thrush or ulcers Resp: Lungs clear bilaterally Cardio: Regular rate and rhythm GI: No hepatomegaly, no mass, nontender Vascular: No leg edema Neuro: The vibratory sense is intact at the fingertips bilaterally   Portacath/PICC-without erythema  Lab Results:  Lab Results  Component Value Date   WBC 7.2 11/14/2017   HGB 10.7 (L) 10/10/2017   HCT 32.5 (L) 11/14/2017   MCV 92.9 11/14/2017   PLT 128 (L) 11/14/2017   NEUTROABS 4.3 11/14/2017    CMP     Component Value Date/Time   NA 138 11/14/2017 0821   NA 139 09/07/2017 0844   K 3.9 11/14/2017 0821   K 5.4 No visable hemolysis (H) 09/07/2017 0844   CL 107 11/14/2017 0821   CO2 24 11/14/2017 0821   CO2 27 09/07/2017 0844   GLUCOSE 95 11/14/2017 0821   GLUCOSE 227 (H) 09/07/2017 0844   BUN 15 11/14/2017 0821   BUN 29.8 (H) 09/07/2017 0844   CREATININE 0.80 11/14/2017 0821   CREATININE 1.2 09/07/2017 0844   CALCIUM 9.5 11/14/2017 0821   CALCIUM 9.8 09/07/2017 0844   PROT 6.4 11/14/2017 0821   PROT 6.9 09/07/2017 0844   ALBUMIN 3.5 11/14/2017 0821   ALBUMIN 3.9 09/07/2017 0844   AST 14 11/14/2017 0821   AST 13 09/07/2017 0844   ALT 10 11/14/2017 0821   ALT 12 09/07/2017 0844   ALKPHOS 95 11/14/2017 0821   ALKPHOS 89 09/07/2017 0844   BILITOT 0.3 11/14/2017 0821   BILITOT 0.37 09/07/2017 0844   GFRNONAA >60  11/14/2017 0821   GFRAA >60 11/14/2017 0821    No results found for: CEA1  Lab Results  Component Value Date   INR 1.03 08/17/2017    Imaging:  No results found.  Medications: I have reviewed the patient's current medications.   Assessment/Plan: 1. Adenocarcinoma of the pancreas head/neck, status post an EUS biopsy 08/02/2017 confirming adenocarcinoma ? EUS 08/02/2017-pancreas head/neck mass, abutment of the portal vein, no peripancreatic adenopathy ? MRI abdomen 07/27/2017-head/neck pancreas mass with mass-effect on the portal splenic confluence, no involvement of the celiac axis or superior mesenteric artery, no evidence of metastatic disease ? PET scan 08/31/2017-very hypermetabolism at the pancreatic head mass, no evidence of metastatic disease. The isolated right lower lobe nodule below PET resolution, left sided renal mass with decreased activity relative to the normal adjacent kidney ? Cycle 1 FOLFIRINOX 09/16/2017 ? Cycle 2 FOLFOX 10/16/2017 ? Cycle 3 FOLFOX 10/30/2017 ? Cycle 4 FOLFIRINOX 11/14/2017 (irinotecan resumed with a dose reduction)  2. 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 Barry Dienes  9. Nausea, diarrhea, and abdominal pain-chemotherapy enteritis  versus bowel obstruction versus ischemic enteritis  10. Dehydrationsecondary to nausea and diarrhea-resolved  11. Pancytopenia secondary to chemotherapy-improved  Disposition: Mr. Montesinos appears well.  He has tolerated the last 2 cycles of chemotherapy without significant toxicity.  The plan is to proceed with cycle 4 chemotherapy today.  We discussed the risk of reintroducing  irinotecan into the chemotherapy regimen.  He agrees to add irinotecan today.  The irinotecan will be dose reduced.  He will begin Lomotil and contact us if he develops diarrhea.  He will receive Neulasta with this cycle.  Mr. Throgmorton will return for an office visit and chemotherapy in 2 weeks.  He will undergo a restaging CT evaluation after cycle 5.  25 minutes were spent with the patient today.  The majority of the time was used for counseling and coordination of care.  [name], MD  11/14/2017  10:38 AM

## 2017-11-14 NOTE — Progress Notes (Signed)
Nutrition follow-up completed with patient during infusion for pancreas cancer. Weight is stable and documented as 156.1 pounds on March 5 Patient reports his glucose levels are better controlled. Patient reports increased appetite as well as oral intake. Patient denies nutrition Impact symptoms.  Nutrition diagnosis:  Food and nutrition related knowledge deficit improved  Intervention: Educated patient to continue strategies for adequate calorie and protein intake for weight maintenance. Encouraged continued compliance with no concentrated sweets for better blood sugar control. Teach back method used.  Monitoring evaluation goals: Patient will tolerate adequate calories and protein for weight maintenance.  Next visit visit: To be scheduled as needed.  **Disclaimer: This note was dictated with voice recognition software. Similar sounding words can inadvertently be transcribed and this note may contain transcription errors which may not have been corrected upon publication of note.**

## 2017-11-14 NOTE — Telephone Encounter (Signed)
Scheduled appt per 3/5 los - Patient to get an updated schedule in treatment area per Rn sara . Unable to schedule on 3/19 due to another appt - scheduled for 3/20

## 2017-11-15 LAB — CANCER ANTIGEN 19-9: CA 19-9: 19 U/mL (ref 0–35)

## 2017-11-16 ENCOUNTER — Inpatient Hospital Stay: Payer: Medicare Other

## 2017-11-16 VITALS — BP 175/80 | HR 72 | Temp 98.2°F | Resp 18

## 2017-11-16 DIAGNOSIS — Z5111 Encounter for antineoplastic chemotherapy: Secondary | ICD-10-CM | POA: Diagnosis not present

## 2017-11-16 DIAGNOSIS — C250 Malignant neoplasm of head of pancreas: Secondary | ICD-10-CM

## 2017-11-16 DIAGNOSIS — Z5189 Encounter for other specified aftercare: Secondary | ICD-10-CM | POA: Diagnosis not present

## 2017-11-16 DIAGNOSIS — I2510 Atherosclerotic heart disease of native coronary artery without angina pectoris: Secondary | ICD-10-CM | POA: Diagnosis not present

## 2017-11-16 DIAGNOSIS — I350 Nonrheumatic aortic (valve) stenosis: Secondary | ICD-10-CM | POA: Diagnosis not present

## 2017-11-16 DIAGNOSIS — I252 Old myocardial infarction: Secondary | ICD-10-CM | POA: Diagnosis not present

## 2017-11-16 MED ORDER — SODIUM CHLORIDE 0.9% FLUSH
10.0000 mL | INTRAVENOUS | Status: DC | PRN
  Administered 2017-11-16: 10 mL
  Filled 2017-11-16: qty 10

## 2017-11-16 MED ORDER — PEGFILGRASTIM INJECTION 6 MG/0.6ML ~~LOC~~
PREFILLED_SYRINGE | SUBCUTANEOUS | Status: AC
  Filled 2017-11-16: qty 0.6

## 2017-11-16 MED ORDER — PEGFILGRASTIM INJECTION 6 MG/0.6ML ~~LOC~~
6.0000 mg | PREFILLED_SYRINGE | Freq: Once | SUBCUTANEOUS | Status: AC
  Administered 2017-11-16: 6 mg via SUBCUTANEOUS

## 2017-11-16 MED ORDER — HEPARIN SOD (PORK) LOCK FLUSH 100 UNIT/ML IV SOLN
500.0000 [IU] | Freq: Once | INTRAVENOUS | Status: AC | PRN
  Administered 2017-11-16: 500 [IU]
  Filled 2017-11-16: qty 5

## 2017-11-20 ENCOUNTER — Telehealth: Payer: Self-pay

## 2017-11-20 ENCOUNTER — Ambulatory Visit: Payer: Medicare Other | Admitting: Internal Medicine

## 2017-11-20 ENCOUNTER — Telehealth: Payer: Self-pay | Admitting: Internal Medicine

## 2017-11-20 NOTE — Telephone Encounter (Signed)
Called pt. No answer. Left vmail to return call. Needs mealtime insulin (Novolog) increased to 7-8 units before meals during ChemoTx per Dr. Cruzita Lederer.

## 2017-11-20 NOTE — Telephone Encounter (Signed)
[name] (patient's wife) returning Nurse's call. Please call ph# [phone]

## 2017-11-21 ENCOUNTER — Telehealth: Payer: Self-pay | Admitting: Internal Medicine

## 2017-11-21 NOTE — Telephone Encounter (Signed)
Spoke to spouse. Gave med instructions. Patient verbalized understanding.  Spouse confirmed upcoming appt on 03/19.

## 2017-11-21 NOTE — Telephone Encounter (Signed)
Patient is returning your call concerning lab results

## 2017-11-22 NOTE — Telephone Encounter (Signed)
Please see previous telephone note. Thanks.

## 2017-11-26 ENCOUNTER — Other Ambulatory Visit: Payer: Self-pay | Admitting: Oncology

## 2017-11-28 ENCOUNTER — Ambulatory Visit (INDEPENDENT_AMBULATORY_CARE_PROVIDER_SITE_OTHER): Payer: Medicare Other | Admitting: Internal Medicine

## 2017-11-28 ENCOUNTER — Encounter: Payer: Self-pay | Admitting: Internal Medicine

## 2017-11-28 VITALS — BP 140/73 | HR 68 | Ht 70.0 in | Wt 156.0 lb

## 2017-11-28 DIAGNOSIS — E1165 Type 2 diabetes mellitus with hyperglycemia: Secondary | ICD-10-CM | POA: Diagnosis not present

## 2017-11-28 DIAGNOSIS — I2510 Atherosclerotic heart disease of native coronary artery without angina pectoris: Secondary | ICD-10-CM | POA: Diagnosis not present

## 2017-11-28 DIAGNOSIS — E1159 Type 2 diabetes mellitus with other circulatory complications: Secondary | ICD-10-CM | POA: Diagnosis not present

## 2017-11-28 LAB — HEMOGLOBIN A1C: Hgb A1c MFr Bld: 8.2 % — ABNORMAL HIGH (ref 4.6–6.5)

## 2017-11-28 NOTE — Progress Notes (Signed)
Patient ID: Manuel Olson, male   DOB: 1946/08/26, 72 y.o.   MRN: [id number]   HPI: Manuel Olson is a 72 y.o.-year-old male, initially referred by his oncologist, Dr. Benay Spice, returning for follow-up for DM2, dx in 01/2016, insulin-dependent, uncontrolled, with complications (CAD - s/p AMI 11/2016 - s/p 4x DES; Ao stenosis). He saw Dr. Dorris Fetch before.  Last visit with me 1.5 months ago.  He is here with his wife who offers a significant amount of history, especially regarding his past medical history, diabetes medicines and his sugars at home.  Patient was diagnosed with pancreatic cancer in 07/2017 and started chemotherapy in 09/2017.  He also has to have a kidney removed for renal cell carcinoma and this will be done at the same time with a pancreatic resection.  At last visit, we added back long-acting insulin and since last visit we also started NovoLog.  He did very well on these, which sugars improving significantly.  Last hemoglobin A1c was: Lab Results  Component Value Date   HGBA1C 10.4 (H) 08/17/2017   Pt was on a regimen of: - Glipizide 5 mg daily  - Tresiba 20 units at bedtime (samples, cannot afford this)    He is currently on:  - Basaglar 20 units at bedtime - NovoLog 5 units before the 3 meals  Pt was checking his sugars 2-4 times a day >> Brings a very good sugar log: - am: 82, 128-242 >> 60-125, 188 - 2h after b'fast: n/c - before lunch: 140-301 >> 95-151, 164 - 2h after lunch: n/c - before dinner: 127-373, 398 >> 63, 91-144, 158, 212 (ChTx) - 2h after dinner: n/c - bedtime: 224-341 >> 97-161, 224 (ChTx) - nighttime: n/c Lowest sugar was 82 >> 60; It is unclear at what level he has hypoglycemia awareness Highest sugar was 398 >> 224.  Glucometer: True Metrix  Pt's meals are: - Breakfast: bacon + eggs + English + Gatorade/V8/Boost - Lunch: grilled ham and cheese sandwich + potato chips - Dinner: chicken + veggies + starch - Snacks: fruit cup, pudding   - 1x a day  -No CKD, last BUN/creatinine:  Lab Results  Component Value Date   BUN 15 11/14/2017   BUN 14 10/30/2017   CREATININE 0.80 11/14/2017   CREATININE 0.95 10/30/2017  On lisinopril. - + HL; last set of lipids: Lab Results  Component Value Date   CHOL 102 03/16/2017   HDL 36 (L) 03/16/2017   LDLCALC 52 03/16/2017   TRIG 71 03/16/2017   CHOLHDL 2.8 03/16/2017  On Lipitor 20. - last eye exam was in 2018: No DR."I had a stroke in my eye". + h/o Laser Sx. -No numbness but +  tingling in his feet.  Pt has FH of DM in brother.  He also has a history of hypertension.  ROS: Constitutional: + weight loss, no fatigue, no subjective hyperthermia, no subjective hypothermia, + nocturia Eyes: no blurry vision, no xerophthalmia ENT: no sore throat, no nodules palpated in throat, no dysphagia, no odynophagia, no hoarseness Cardiovascular: no CP/no SOB/no palpitations/no leg swelling Respiratory: no cough/no SOB/no wheezing Gastrointestinal: + Nausea/vomiting/diarrhea  Musculoskeletal: no muscle aches/no joint aches Skin: no rashes, + hair loss Neurological: no tremors/no numbness/no tingling/no dizziness  I reviewed pt's medications, allergies, PMH, social hx, family hx, and changes were documented in the history of present illness. Otherwise, unchanged from my initial visit note.  Past Medical History:  Diagnosis Date  . Arthritis   . CAD (coronary artery disease)  a. 11/2016 in [place]: STEMI s/p DES to mid LAD, DES to diagonal, DES x 2 to proximal and mid RCA   . Essential hypertension   . Former smoker   . GERD (gastroesophageal reflux disease)   . Hyperlipidemia   . Pancreatic cancer ([place])   . Renal mass   . S/P TAVR (transcatheter aortic valve replacement)    a. 08/2017:  Edwards Sapien 3 THV (size 26 mm, model #9600CM26A , serial # [id number])  . Severe aortic stenosis    a. 08/2017: s/p TAVR by Dr. Angelena Form and Dr. Cyndia Bent.   . 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  Type 2 diabetes mellitus ([place])    Past Surgical History:  Procedure Laterality Date  . APPENDECTOMY    . EUS N/A 08/02/2017   Procedure: UPPER ENDOSCOPIC ULTRASOUND (EUS) RADIAL;  Surgeon: [name], MD;  Location: [facility];  Service: Endoscopy;  Laterality: N/A;  . EYE SURGERY Left   . HAND SURGERY Bilateral   . HERNIA REPAIR Right   . PORTACATH PLACEMENT N/A 09/15/2017   Procedure: INSERTION PORT-A-CATH;  Surgeon: [name], MD;  Location: [place];  Service: General;  Laterality: N/A;  . RIGHT/LEFT HEART CATH AND CORONARY ANGIOGRAPHY N/A 07/05/2017   Procedure: RIGHT/LEFT HEART CATH AND CORONARY ANGIOGRAPHY;  Surgeon: [name], MD;  Location: [place] CV LAB;  Service: Cardiovascular;  Laterality: N/A;  . SHOULDER ARTHROSCOPY WITH ROTATOR CUFF REPAIR Left   . TEE WITHOUT CARDIOVERSION N/A 08/22/2017   Procedure: TRANSESOPHAGEAL ECHOCARDIOGRAM (TEE);  Surgeon: Burnell Blanks, MD;  Location: [place];  Service: Open Heart Surgery;  Laterality: N/A;  . TRANSCATHETER AORTIC VALVE REPLACEMENT, TRANSFEMORAL N/A 08/22/2017   Procedure: TRANSCATHETER AORTIC VALVE REPLACEMENT, TRANSFEMORAL;  Surgeon: Burnell Blanks, MD;  Location: [place];  Service: Open Heart Surgery;  Laterality: N/A;   Social History   Socioeconomic History  . Marital status: Married    Spouse name: Not on file  . Number of children: Not on file  Social Needs  Occupational History  .  Retired  Tobacco Use  . Smoking status: Former Smoker    Packs/day: 1.00    Years: 57.00    Pack years: 57.00    Types: Cigarettes    Start date: 09/12/1958    Last attempt to quit: 2018    Years since quitting: 1.5  . Smokeless tobacco: Never Used  Substance and Sexual Activity  . Alcohol use: No  . Drug use: No   Current Outpatient Medications on File Prior to Visit  Medication Sig Dispense Refill  . aspirin EC 81 MG tablet Take 1 tablet (81 mg total) by mouth daily. 90  tablet 3  . atorvastatin (LIPITOR) 20 MG tablet Take 1 tablet (20 mg total) by mouth daily. 90 tablet 1  . baclofen (LIORESAL) 10 MG tablet Take 0.5 tablets (5 mg total) by mouth 3 (three) times daily as needed for muscle spasms. 30 each 0  . carvedilol (COREG) 3.125 MG tablet TAKE (1) TABLET BY MOUTH TWICE A DAY WITH MEALS (BREAKFAST AND SUPPER) 180 tablet 2  . clopidogrel (PLAVIX) 75 MG tablet Take 1 tablet (75 mg total) by mouth daily. 90 tablet 1  . diphenoxylate-atropine (LOMOTIL) 2.5-0.025 MG tablet Take 2 tablets by mouth 4 (four) times daily as needed for diarrhea or loose stools. 30 tablet 0  . insulin aspart (NOVOLOG FLEXPEN) 100 UNIT/ML FlexPen Inject 5 Units into the skin 3 (three) times daily  with meals. 15 mL 0  . Insulin Glargine (BASAGLAR KWIKPEN) 100 UNIT/ML SOPN Inject 0.2 mLs (20 Units total) into the skin at bedtime. 5 pen 5  . Insulin Pen Needle (CAREFINE PEN NEEDLES) 32G X 4 MM MISC Use 1x a day 100 each 3  . lidocaine-prilocaine (EMLA) cream Apply 1 application topically as needed. Apply small amount of cream over port site 1-2 hrs prior to chemo, cover with plastic wrap. (Patient taking differently: Apply 1 application topically as needed (for recieving chemo in port site). Apply small amount of cream over port site 1-2 hrs prior to chemo, cover with plastic wrap.) 30 g 0  . lisinopril (PRINIVIL,ZESTRIL) 10 MG tablet Take 1 tablet (10 mg total) by mouth daily. 90 tablet 2  . ondansetron (ZOFRAN) 8 MG tablet Take 1 tablet (8 mg total) by mouth every 8 (eight) hours as needed for nausea or vomiting. 20 tablet 0  . prochlorperazine (COMPAZINE) 5 MG tablet Take 1 tablet (5 mg total) by mouth every 6 (six) hours as needed for nausea or vomiting. 30 tablet 0   No current facility-administered medications on file prior to visit.    No Known Allergies Family History  Problem Relation Age of Onset  . Lupus Mother   . CAD Brother   . Hypertension Brother     PE: BP 140/73    Pulse 68   Ht 5' 10"  (1.778 m)   Wt 156 lb (70.8 kg)   SpO2 98%   BMI 22.38 kg/m  Wt Readings from Last 3 Encounters:  11/28/17 156 lb (70.8 kg)  11/14/17 156 lb 1.6 oz (70.8 kg)  10/30/17 155 lb 8 oz (70.5 kg)   Constitutional: Thin, + temporal wasting, in NAD Eyes: PERRLA, EOMI, no exophthalmos ENT: moist mucous membranes, no thyromegaly, no cervical lymphadenopathy Cardiovascular: RRR, No MRG Respiratory: CTA B Gastrointestinal: abdomen soft, NT, ND, BS+ Musculoskeletal: no deformities, strength intact in all 4 Skin: moist, warm, no rashes Neurological: + Mild tremor with outstretched hands, DTR normal in all 4  ASSESSMENT: 1. DM2, non-insulin-dependent, uncontrolled, with complications - CAD - s/p AMI 11/2016 - s/p DES x4 -  Ao stenosis  2. HL  PLAN:  1. Patient with history of initially controlled diabetes, with deteriorating control around the time of his pancreatic cancer diagnosis at the end of 2018.  He was initially on glipizide, with good control, but after his cancer diagnosis, as his sugars were worse, he had to start long-term insulin.  He is currently on Basaglar.  Since last visit, we also had to add mealtime insulin.  He was advised to increase his doses around the time of his chemotherapy.  At last visit, I advised him to change from sugary drinks to Glucerna. - Since last visit, especially after adding NovoLog, he has done wonderful, with almost all sugars at or slightly above goal at this visit.  However, he also has some sugars in the 60s.  We discussed about management of NovoLog doses when his sugars are lower than normal.  If he continues to have slight lows, we need to reduce Basaglar. - I suggested to:  Patient Instructions  Please continue: - Basaglar 20 units at bedtime  Please change: - NovoLog 4-6 units before the 3 meals  Take the same NovoLog dose if sugars before the meal are >80. Take 50% less NovoLog if sugars before the meal are 60-80. Skip  Novolog if sugars before the meals are <60.  Please return in 3  months with your sugar log.   - We will check an HbA1c today - continue checking sugars at different times of the day - check 3x a day, rotating checks - advised for yearly eye exams >> he is UTD - Return to clinic in 3 mo with sugar log   2. HL -Reviewed lipids from 03/2017: LDL at goal, HDL slightly low -Continues on Lipitor without side effects  Office Visit on 11/28/2017  Component Date Value Ref Range Status  . Hgb A1c MFr Bld 11/28/2017 8.2* 4.6 - 6.5 % Final   Glycemic Control Guidelines for People with Diabetes:Non Diabetic:  <6%Goal of Therapy: <7%Additional Action Suggested:  >8%    The HbA1c has decreased quite a bit but I think the next one will be even better!  Philemon Kingdom, MD PhD [facility] Endocrinology

## 2017-11-28 NOTE — Patient Instructions (Addendum)
Please continue: - Basaglar 20 units at bedtime  Please change: - NovoLog 4-6 units before the 3 meals  Take the same NovoLog dose if sugars before the meal are >80. Take 50% less NovoLog if sugars before the meal are 60-80. Skip Novolog if sugars before the meals are <60.  Please return in 3 months with your sugar log.

## 2017-11-29 ENCOUNTER — Telehealth: Payer: Self-pay

## 2017-11-29 ENCOUNTER — Inpatient Hospital Stay (HOSPITAL_BASED_OUTPATIENT_CLINIC_OR_DEPARTMENT_OTHER): Payer: Medicare Other | Admitting: Nurse Practitioner

## 2017-11-29 ENCOUNTER — Inpatient Hospital Stay: Payer: Medicare Other

## 2017-11-29 ENCOUNTER — Encounter: Payer: Self-pay | Admitting: Nurse Practitioner

## 2017-11-29 ENCOUNTER — Telehealth: Payer: Self-pay | Admitting: Nurse Practitioner

## 2017-11-29 VITALS — BP 124/59 | HR 61 | Temp 97.7°F | Resp 18 | Ht 70.0 in | Wt 155.1 lb

## 2017-11-29 DIAGNOSIS — C250 Malignant neoplasm of head of pancreas: Secondary | ICD-10-CM

## 2017-11-29 DIAGNOSIS — I350 Nonrheumatic aortic (valve) stenosis: Secondary | ICD-10-CM | POA: Diagnosis not present

## 2017-11-29 DIAGNOSIS — I2510 Atherosclerotic heart disease of native coronary artery without angina pectoris: Secondary | ICD-10-CM

## 2017-11-29 DIAGNOSIS — I10 Essential (primary) hypertension: Secondary | ICD-10-CM | POA: Diagnosis not present

## 2017-11-29 DIAGNOSIS — N2889 Other specified disorders of kidney and ureter: Secondary | ICD-10-CM | POA: Diagnosis not present

## 2017-11-29 DIAGNOSIS — R110 Nausea: Secondary | ICD-10-CM

## 2017-11-29 DIAGNOSIS — I252 Old myocardial infarction: Secondary | ICD-10-CM

## 2017-11-29 DIAGNOSIS — R197 Diarrhea, unspecified: Secondary | ICD-10-CM

## 2017-11-29 DIAGNOSIS — Z87891 Personal history of nicotine dependence: Secondary | ICD-10-CM | POA: Diagnosis not present

## 2017-11-29 DIAGNOSIS — Z5189 Encounter for other specified aftercare: Secondary | ICD-10-CM | POA: Diagnosis not present

## 2017-11-29 DIAGNOSIS — D696 Thrombocytopenia, unspecified: Secondary | ICD-10-CM | POA: Diagnosis not present

## 2017-11-29 DIAGNOSIS — Z5111 Encounter for antineoplastic chemotherapy: Secondary | ICD-10-CM | POA: Diagnosis not present

## 2017-11-29 DIAGNOSIS — Z95828 Presence of other vascular implants and grafts: Secondary | ICD-10-CM

## 2017-11-29 DIAGNOSIS — E119 Type 2 diabetes mellitus without complications: Secondary | ICD-10-CM | POA: Diagnosis not present

## 2017-11-29 LAB — CMP (CANCER CENTER ONLY)
ALT: 19 U/L (ref 0–55)
AST: 19 U/L (ref 5–34)
Albumin: 3.4 g/dL — ABNORMAL LOW (ref 3.5–5.0)
Alkaline Phosphatase: 112 U/L (ref 40–150)
Anion gap: 7 (ref 3–11)
BUN: 12 mg/dL (ref 7–26)
CO2: 25 mmol/L (ref 22–29)
Calcium: 9.1 mg/dL (ref 8.4–10.4)
Chloride: 106 mmol/L (ref 98–109)
Creatinine: 0.81 mg/dL (ref 0.70–1.30)
GFR, Est AFR Am: >60 mL/min (ref >60)
GFR, Estimated: >60 mL/min (ref >60)
Glucose, Bld: 145 mg/dL — ABNORMAL HIGH (ref 70–140)
Potassium: 3.6 mmol/L (ref 3.5–5.1)
Sodium: 138 mmol/L (ref 136–145)
Total Bilirubin: 0.3 mg/dL (ref 0.2–1.2)
Total Protein: 5.9 g/dL — ABNORMAL LOW (ref 6.4–8.3)

## 2017-11-29 LAB — CBC WITH DIFFERENTIAL (CANCER CENTER ONLY)
Basophils Absolute: 0 10*3/uL (ref 0.0–0.1)
Basophils Relative: 0 %
Eosinophils Absolute: 0.1 10*3/uL (ref 0.0–0.5)
Eosinophils Relative: 1 %
HCT: 31.5 % — ABNORMAL LOW (ref 38.4–49.9)
Hemoglobin: 10.4 g/dL — ABNORMAL LOW (ref 13.0–17.1)
Lymphocytes Relative: 12 %
Lymphs Abs: 1.2 10*3/uL (ref 0.9–3.3)
MCH: 30.7 pg (ref 27.2–33.4)
MCHC: 32.9 g/dL (ref 32.0–36.0)
MCV: 93.2 fL (ref 79.3–98.0)
Monocytes Absolute: 1.1 10*3/uL — ABNORMAL HIGH (ref 0.1–0.9)
Monocytes Relative: 11 %
Neutro Abs: 7.5 10*3/uL — ABNORMAL HIGH (ref 1.5–6.5)
Neutrophils Relative %: 76 %
Platelet Count: 87 10*3/uL — ABNORMAL LOW (ref 140–400)
RBC: 3.38 MIL/uL — ABNORMAL LOW (ref 4.20–5.82)
RDW: 17.2 % — ABNORMAL HIGH (ref 11.0–14.6)
WBC Count: 9.9 10*3/uL (ref 4.0–10.3)

## 2017-11-29 LAB — MAGNESIUM: Magnesium: 1.7 mg/dL (ref 1.5–2.5)

## 2017-11-29 MED ORDER — PALONOSETRON HCL INJECTION 0.25 MG/5ML
INTRAVENOUS | Status: AC
Start: 2017-11-29 — End: 2017-11-29
  Filled 2017-11-29: qty 5

## 2017-11-29 MED ORDER — SODIUM CHLORIDE 0.9% FLUSH
10.0000 mL | Freq: Once | INTRAVENOUS | Status: AC
  Administered 2017-11-29: 10 mL
  Filled 2017-11-29: qty 10

## 2017-11-29 MED ORDER — ATROPINE SULFATE 1 MG/ML IJ SOLN
0.5000 mg | Freq: Once | INTRAMUSCULAR | Status: AC | PRN
  Administered 2017-11-29: 0.5 mg via INTRAVENOUS

## 2017-11-29 MED ORDER — LEUCOVORIN CALCIUM INJECTION 350 MG
400.0000 mg/m2 | Freq: Once | INTRAMUSCULAR | Status: AC
  Administered 2017-11-29: 748 mg via INTRAVENOUS
  Filled 2017-11-29: qty 37.4

## 2017-11-29 MED ORDER — PALONOSETRON HCL INJECTION 0.25 MG/5ML
0.2500 mg | Freq: Once | INTRAVENOUS | Status: AC
  Administered 2017-11-29: 0.25 mg via INTRAVENOUS

## 2017-11-29 MED ORDER — DIPHENOXYLATE-ATROPINE 2.5-0.025 MG PO TABS: 2.0000 | ORAL_TABLET | Freq: Four times a day (QID) | ORAL | 0 refills | Status: DC | PRN

## 2017-11-29 MED ORDER — DEXTROSE 5 % IV SOLN
Freq: Once | INTRAVENOUS | Status: AC
  Administered 2017-11-29: 13:00:00 via INTRAVENOUS

## 2017-11-29 MED ORDER — FOSAPREPITANT DIMEGLUMINE INJECTION 150 MG
Freq: Once | INTRAVENOUS | Status: AC
  Administered 2017-11-29: 14:00:00 via INTRAVENOUS
  Filled 2017-11-29: qty 5

## 2017-11-29 MED ORDER — FLUOROURACIL CHEMO INJECTION 5 GM/100ML
1600.0000 mg/m2 | INTRAVENOUS | Status: DC
  Administered 2017-11-29: 3000 mg via INTRAVENOUS
  Filled 2017-11-29: qty 60

## 2017-11-29 MED ORDER — OXALIPLATIN CHEMO INJECTION 100 MG/20ML
65.0000 mg/m2 | Freq: Once | INTRAVENOUS | Status: AC
  Administered 2017-11-29: 120 mg via INTRAVENOUS
  Filled 2017-11-29: qty 4

## 2017-11-29 MED ORDER — IRINOTECAN HCL CHEMO INJECTION 100 MG/5ML
75.0000 mg/m2 | Freq: Once | INTRAVENOUS | Status: AC
  Administered 2017-11-29: 140 mg via INTRAVENOUS
  Filled 2017-11-29: qty 7

## 2017-11-29 MED ORDER — ATROPINE SULFATE 1 MG/ML IJ SOLN
INTRAMUSCULAR | Status: AC
  Filled 2017-11-29: qty 1

## 2017-11-29 NOTE — Telephone Encounter (Signed)
Scheduled appt per 3/20 los - left message with appt date and time.

## 2017-11-29 NOTE — Progress Notes (Signed)
Per Lattie Haw T NP, ok to tx today with platelets of 87 ...dose reducing Oxaliplatin today.

## 2017-11-29 NOTE — Progress Notes (Addendum)
  [name] OFFICE PROGRESS NOTE   Diagnosis: Pancreas cancer  INTERVAL HISTORY:   Manuel Olson returns as scheduled.  He completed a cycle of FOLFIRINOX 11/14/2017.  He developed diarrhea on day 2.  The diarrhea occurred intermittently for about 6 days.  He took Lomotil with good relief.  He had several episodes of nausea/vomiting all relieved with Compazine.  No mouth sores.  Cold sensitivity lasted over a week.  He denies persistent neuropathy symptoms.  He reports a good appetite.  He denies bleeding.  Objective:  Vital signs in last 24 hours:  Blood pressure (!) 124/59, pulse 61, temperature 97.7 F (36.5 C), temperature source Oral, resp. rate 18, height 5' 10"  (1.778 m), weight 155 lb 1.6 oz (70.4 kg), SpO2 100 %.    HEENT: No thrush or ulcers. Resp: Lungs clear bilaterally. Cardio: Regular rate and rhythm. GI: Abdomen soft and nontender.  No hepatomegaly. Vascular: No leg edema.  Port-A-Cath without erythema.  Lab Results:  Lab Results  Component Value Date   WBC 9.9 11/29/2017   HGB 10.7 (L) 10/10/2017   HCT 31.5 (L) 11/29/2017   MCV 93.2 11/29/2017   PLT 87 (L) 11/29/2017   NEUTROABS 7.5 (H) 11/29/2017    Imaging:  No results found.  Medications: I have reviewed the patient's current medications.  Assessment/Plan: 1. Adenocarcinoma of the pancreas head/neck, status post an EUS biopsy 08/02/2017 confirming adenocarcinoma ? EUS 08/02/2017-pancreas head/neck mass, abutment of the portal vein, no peripancreatic adenopathy ? MRI abdomen 07/27/2017-head/neck pancreas mass with mass-effect on the portal splenic confluence, no involvement of the celiac axis or superior mesenteric artery, no evidence of metastatic disease ? PET scan 08/31/2017-very hypermetabolism at the pancreatic head mass, no evidence of metastatic disease. The isolated right lower lobe nodule below PET resolution, left sided renal mass with decreased activity relative to the normal  adjacent kidney ? Cycle 1 FOLFIRINOX 09/16/2017 ? Cycle 2 FOLFOX 10/16/2017 ? Cycle 3 FOLFOX 10/30/2017 ? Cycle 4 FOLFIRINOX 11/14/2017 (irinotecan resumed with a dose reduction) ? Cycle 5 FOLFIRINOX 11/29/2017 (Oxaliplatin dose reduced due to thrombocytopenia)  2. 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 Barry Dienes  9. Nausea, diarrhea, and abdominal pain-chemotherapy enteritis versus bowel obstruction versus ischemic enteritis  10. Dehydrationsecondary to nausea and diarrhea-resolved  11. Pancytopenia secondary to chemotherapy-improved    Disposition: Manuel Olson appears stable.  He has completed 4 cycles of systemic therapy.  Irinotecan was resumed with cycle 4.  He had mild intermittent diarrhea controlled with Lomotil and some nausea controlled with Compazine. Plan to proceed with FOLFIRINOX  (cycle 5 systemic therapy) today as scheduled.  The plan is to obtain restaging CTs after he has completed 6 cycles.  We reviewed today's labs.  He has mild thrombocytopenia.  Oxaliplatin will be dose reduced.  He understands to contact the office with any bleeding.  He will return for lab, follow-up and cycle 6 FOLFIRINOX in 2 weeks.  He will contact the office in the interim as outlined above or with any other problems.  Plan reviewed with Dr. Benay Spice.  [name] ANP/GNP-BC   11/29/2017  12:47 PM

## 2017-11-29 NOTE — Patient Instructions (Signed)
[place] Discharge Instructions for Patients Receiving Chemotherapy  Today you received the following chemotherapy agents: Oxaliplatin, Irinotecan,  Leucovorin, 46f  To help prevent nausea and vomiting after your treatment, we encourage you to take your nausea medication as prescribed    If you develop nausea and vomiting that is not controlled by your nausea medication, call the clinic.   BELOW ARE SYMPTOMS THAT SHOULD BE REPORTED IMMEDIATELY:  *FEVER GREATER THAN 100.5 F  *CHILLS WITH OR WITHOUT FEVER  NAUSEA AND VOMITING THAT IS NOT CONTROLLED WITH YOUR NAUSEA MEDICATION  *UNUSUAL SHORTNESS OF BREATH  *UNUSUAL BRUISING OR BLEEDING  TENDERNESS IN MOUTH AND THROAT WITH OR WITHOUT PRESENCE OF ULCERS  *URINARY PROBLEMS  *BOWEL PROBLEMS  UNUSUAL RASH Items with * indicate a potential emergency and should be followed up as soon as possible.  Feel free to call the clinic should you have any questions or concerns. The clinic phone number is [phone].  Please show the [facility] check-in to the Emergency Department and triage nurse.

## 2017-11-29 NOTE — Telephone Encounter (Signed)
Called in Lomotil to pharmacy

## 2017-12-01 ENCOUNTER — Other Ambulatory Visit: Payer: Self-pay | Admitting: *Deleted

## 2017-12-01 ENCOUNTER — Inpatient Hospital Stay: Payer: Medicare Other

## 2017-12-01 VITALS — BP 128/68 | HR 61 | Temp 98.2°F | Resp 17

## 2017-12-01 DIAGNOSIS — I252 Old myocardial infarction: Secondary | ICD-10-CM | POA: Diagnosis not present

## 2017-12-01 DIAGNOSIS — Z5189 Encounter for other specified aftercare: Secondary | ICD-10-CM | POA: Diagnosis not present

## 2017-12-01 DIAGNOSIS — C250 Malignant neoplasm of head of pancreas: Secondary | ICD-10-CM

## 2017-12-01 DIAGNOSIS — Z95828 Presence of other vascular implants and grafts: Secondary | ICD-10-CM

## 2017-12-01 DIAGNOSIS — I350 Nonrheumatic aortic (valve) stenosis: Secondary | ICD-10-CM | POA: Diagnosis not present

## 2017-12-01 DIAGNOSIS — Z5111 Encounter for antineoplastic chemotherapy: Secondary | ICD-10-CM | POA: Diagnosis not present

## 2017-12-01 DIAGNOSIS — I2510 Atherosclerotic heart disease of native coronary artery without angina pectoris: Secondary | ICD-10-CM | POA: Diagnosis not present

## 2017-12-01 MED ORDER — PEGFILGRASTIM INJECTION 6 MG/0.6ML ~~LOC~~
PREFILLED_SYRINGE | SUBCUTANEOUS | Status: AC
Start: 2017-12-01 — End: 2017-12-01
  Filled 2017-12-01: qty 0.6

## 2017-12-01 MED ORDER — DIPHENOXYLATE-ATROPINE 2.5-0.025 MG PO TABS: 2.0000 | ORAL_TABLET | Freq: Four times a day (QID) | ORAL | 0 refills | Status: DC | PRN

## 2017-12-01 MED ORDER — SODIUM CHLORIDE 0.9% FLUSH
10.0000 mL | Freq: Once | INTRAVENOUS | Status: AC
  Administered 2017-12-01: 10 mL
  Filled 2017-12-01: qty 10

## 2017-12-01 MED ORDER — PEGFILGRASTIM INJECTION 6 MG/0.6ML ~~LOC~~
6.0000 mg | PREFILLED_SYRINGE | Freq: Once | SUBCUTANEOUS | Status: AC
  Administered 2017-12-01: 6 mg via SUBCUTANEOUS

## 2017-12-01 MED ORDER — HEPARIN SOD (PORK) LOCK FLUSH 100 UNIT/ML IV SOLN
500.0000 [IU] | Freq: Once | INTRAVENOUS | Status: AC
  Administered 2017-12-01: 500 [IU]
  Filled 2017-12-01: qty 5

## 2017-12-01 MED ORDER — PEGFILGRASTIM INJECTION 6 MG/0.6ML ~~LOC~~
PREFILLED_SYRINGE | SUBCUTANEOUS | Status: AC
  Filled 2017-12-01: qty 0.6

## 2017-12-10 ENCOUNTER — Other Ambulatory Visit: Payer: Self-pay | Admitting: Oncology

## 2017-12-13 ENCOUNTER — Inpatient Hospital Stay: Payer: Medicare Other | Attending: Oncology

## 2017-12-13 ENCOUNTER — Encounter: Payer: Self-pay | Admitting: Nurse Practitioner

## 2017-12-13 ENCOUNTER — Inpatient Hospital Stay: Payer: Medicare Other

## 2017-12-13 ENCOUNTER — Telehealth: Payer: Self-pay | Admitting: Oncology

## 2017-12-13 ENCOUNTER — Inpatient Hospital Stay (HOSPITAL_BASED_OUTPATIENT_CLINIC_OR_DEPARTMENT_OTHER): Payer: Medicare Other | Admitting: Nurse Practitioner

## 2017-12-13 VITALS — BP 165/73 | HR 64 | Temp 97.7°F | Resp 17 | Ht 70.0 in | Wt 154.2 lb

## 2017-12-13 DIAGNOSIS — I10 Essential (primary) hypertension: Secondary | ICD-10-CM | POA: Diagnosis not present

## 2017-12-13 DIAGNOSIS — C250 Malignant neoplasm of head of pancreas: Secondary | ICD-10-CM

## 2017-12-13 DIAGNOSIS — Z87891 Personal history of nicotine dependence: Secondary | ICD-10-CM | POA: Diagnosis not present

## 2017-12-13 DIAGNOSIS — I350 Nonrheumatic aortic (valve) stenosis: Secondary | ICD-10-CM | POA: Insufficient documentation

## 2017-12-13 DIAGNOSIS — E119 Type 2 diabetes mellitus without complications: Secondary | ICD-10-CM

## 2017-12-13 DIAGNOSIS — N2889 Other specified disorders of kidney and ureter: Secondary | ICD-10-CM | POA: Insufficient documentation

## 2017-12-13 DIAGNOSIS — Z452 Encounter for adjustment and management of vascular access device: Secondary | ICD-10-CM | POA: Insufficient documentation

## 2017-12-13 DIAGNOSIS — I252 Old myocardial infarction: Secondary | ICD-10-CM | POA: Insufficient documentation

## 2017-12-13 DIAGNOSIS — R911 Solitary pulmonary nodule: Secondary | ICD-10-CM | POA: Insufficient documentation

## 2017-12-13 DIAGNOSIS — Z5111 Encounter for antineoplastic chemotherapy: Secondary | ICD-10-CM | POA: Diagnosis not present

## 2017-12-13 DIAGNOSIS — R42 Dizziness and giddiness: Secondary | ICD-10-CM

## 2017-12-13 DIAGNOSIS — D72829 Elevated white blood cell count, unspecified: Secondary | ICD-10-CM | POA: Diagnosis not present

## 2017-12-13 DIAGNOSIS — R05 Cough: Secondary | ICD-10-CM | POA: Insufficient documentation

## 2017-12-13 DIAGNOSIS — I2510 Atherosclerotic heart disease of native coronary artery without angina pectoris: Secondary | ICD-10-CM

## 2017-12-13 DIAGNOSIS — J3489 Other specified disorders of nose and nasal sinuses: Secondary | ICD-10-CM | POA: Diagnosis not present

## 2017-12-13 DIAGNOSIS — Z95828 Presence of other vascular implants and grafts: Secondary | ICD-10-CM

## 2017-12-13 LAB — CMP (CANCER CENTER ONLY)
ALT: 13 U/L (ref 0–55)
AST: 16 U/L (ref 5–34)
Albumin: 3.6 g/dL (ref 3.5–5.0)
Alkaline Phosphatase: 135 U/L (ref 40–150)
Anion gap: 7 (ref 3–11)
BUN: 13 mg/dL (ref 7–26)
CO2: 24 mmol/L (ref 22–29)
Calcium: 9.2 mg/dL (ref 8.4–10.4)
Chloride: 106 mmol/L (ref 98–109)
Creatinine: 0.76 mg/dL (ref 0.70–1.30)
GFR, Est AFR Am: >60 mL/min (ref >60)
GFR, Estimated: >60 mL/min (ref >60)
Glucose, Bld: 94 mg/dL (ref 70–140)
Potassium: 3.8 mmol/L (ref 3.5–5.1)
Sodium: 137 mmol/L (ref 136–145)
Total Bilirubin: 0.2 mg/dL (ref 0.2–1.2)
Total Protein: 6.1 g/dL — ABNORMAL LOW (ref 6.4–8.3)

## 2017-12-13 LAB — CBC WITH DIFFERENTIAL (CANCER CENTER ONLY)
Basophils Absolute: 0 10*3/uL (ref 0.0–0.1)
Basophils Relative: 0 %
Eosinophils Absolute: 0.1 10*3/uL (ref 0.0–0.5)
Eosinophils Relative: 1 %
HCT: 33.4 % — ABNORMAL LOW (ref 38.4–49.9)
Hemoglobin: 10.7 g/dL — ABNORMAL LOW (ref 13.0–17.1)
Lymphocytes Relative: 11 %
Lymphs Abs: 2 10*3/uL (ref 0.9–3.3)
MCH: 31 pg (ref 27.2–33.4)
MCHC: 32 g/dL (ref 32.0–36.0)
MCV: 96.8 fL (ref 79.3–98.0)
Monocytes Absolute: 1.8 10*3/uL — ABNORMAL HIGH (ref 0.1–0.9)
Monocytes Relative: 10 %
Neutro Abs: 13.4 10*3/uL — ABNORMAL HIGH (ref 1.5–6.5)
Neutrophils Relative %: 78 %
Platelet Count: 137 10*3/uL — ABNORMAL LOW (ref 140–400)
RBC: 3.45 MIL/uL — ABNORMAL LOW (ref 4.20–5.82)
RDW: 17.2 % — ABNORMAL HIGH (ref 11.0–14.6)
WBC Count: 17.3 10*3/uL — ABNORMAL HIGH (ref 4.0–10.3)

## 2017-12-13 MED ORDER — DEXTROSE 5 % IV SOLN
Freq: Once | INTRAVENOUS | Status: AC
  Administered 2017-12-13: 13:00:00 via INTRAVENOUS

## 2017-12-13 MED ORDER — PALONOSETRON HCL INJECTION 0.25 MG/5ML
0.2500 mg | Freq: Once | INTRAVENOUS | Status: AC
  Administered 2017-12-13: 0.25 mg via INTRAVENOUS

## 2017-12-13 MED ORDER — SODIUM CHLORIDE 0.9% FLUSH
10.0000 mL | Freq: Once | INTRAVENOUS | Status: AC
  Administered 2017-12-13: 10 mL
  Filled 2017-12-13: qty 10

## 2017-12-13 MED ORDER — ATROPINE SULFATE 1 MG/ML IJ SOLN
0.5000 mg | Freq: Once | INTRAMUSCULAR | Status: AC | PRN
Start: 2017-12-13 — End: 2017-12-13
  Administered 2017-12-13: 0.5 mg via INTRAVENOUS

## 2017-12-13 MED ORDER — ATROPINE SULFATE 1 MG/ML IJ SOLN
INTRAMUSCULAR | Status: AC
  Filled 2017-12-13: qty 1

## 2017-12-13 MED ORDER — SODIUM CHLORIDE 0.9% FLUSH
10.0000 mL | INTRAVENOUS | Status: DC | PRN
  Filled 2017-12-13: qty 10

## 2017-12-13 MED ORDER — HEPARIN SOD (PORK) LOCK FLUSH 100 UNIT/ML IV SOLN
500.0000 [IU] | Freq: Once | INTRAVENOUS | Status: DC | PRN
  Filled 2017-12-13: qty 5

## 2017-12-13 MED ORDER — LEUCOVORIN CALCIUM INJECTION 350 MG
400.0000 mg/m2 | Freq: Once | INTRAVENOUS | Status: AC
  Administered 2017-12-13: 748 mg via INTRAVENOUS
  Filled 2017-12-13: qty 37.4

## 2017-12-13 MED ORDER — OXALIPLATIN CHEMO INJECTION 100 MG/20ML
65.0000 mg/m2 | Freq: Once | INTRAVENOUS | Status: AC
  Administered 2017-12-13: 120 mg via INTRAVENOUS
  Filled 2017-12-13: qty 20

## 2017-12-13 MED ORDER — SODIUM CHLORIDE 0.9 % IV SOLN
1600.0000 mg/m2 | INTRAVENOUS | Status: DC
  Administered 2017-12-13: 3000 mg via INTRAVENOUS
  Filled 2017-12-13: qty 60

## 2017-12-13 MED ORDER — DEXTROSE 5 % IV SOLN
75.0000 mg/m2 | Freq: Once | INTRAVENOUS | Status: AC
  Administered 2017-12-13: 140 mg via INTRAVENOUS
  Filled 2017-12-13: qty 7

## 2017-12-13 MED ORDER — PALONOSETRON HCL INJECTION 0.25 MG/5ML
INTRAVENOUS | Status: AC
  Filled 2017-12-13: qty 5

## 2017-12-13 MED ORDER — FOSAPREPITANT DIMEGLUMINE INJECTION 150 MG
Freq: Once | INTRAVENOUS | Status: AC
  Administered 2017-12-13: 13:00:00 via INTRAVENOUS
  Filled 2017-12-13: qty 5

## 2017-12-13 NOTE — Patient Instructions (Signed)
[place] Discharge Instructions for Patients Receiving Chemotherapy  Today you received the following chemotherapy agents: Oxaliplatin, Irinotecan,  Leucovorin, 42f  To help prevent nausea and vomiting after your treatment, we encourage you to take your nausea medication as prescribed    If you develop nausea and vomiting that is not controlled by your nausea medication, call the clinic.   BELOW ARE SYMPTOMS THAT SHOULD BE REPORTED IMMEDIATELY:  *FEVER GREATER THAN 100.5 F  *CHILLS WITH OR WITHOUT FEVER  NAUSEA AND VOMITING THAT IS NOT CONTROLLED WITH YOUR NAUSEA MEDICATION  *UNUSUAL SHORTNESS OF BREATH  *UNUSUAL BRUISING OR BLEEDING  TENDERNESS IN MOUTH AND THROAT WITH OR WITHOUT PRESENCE OF ULCERS  *URINARY PROBLEMS  *BOWEL PROBLEMS  UNUSUAL RASH Items with * indicate a potential emergency and should be followed up as soon as possible.  Feel free to call the clinic should you have any questions or concerns. The clinic phone number is [phone].  Please show the [facility] check-in to the Emergency Department and triage nurse.

## 2017-12-13 NOTE — Progress Notes (Addendum)
[place] OFFICE PROGRESS NOTE   Diagnosis: Pancreas cancer  INTERVAL HISTORY:   [name] returns as scheduled.  He completed cycle 5 FOLFIRINOX 11/29/2017.  Oxaliplatin was dose reduced due to thrombocytopenia.  He had mild nausea.  No vomiting.  2 episodes of mild diarrhea.  The diarrhea resolved with a single dose of Lomotil.  No mouth sores.  No hand or foot pain or redness.  He has mild persistent cold sensitivity.  No numbness or tingling in the absence of cold exposure.  He reports episodes of lightheadedness and dizziness after each chemotherapy.  Symptoms typically resolve with rest.  He reports good fluid intake.  Objective:  Vital signs in last 24 hours:  Blood pressure (!) 165/73, pulse 64, temperature 97.7 F (36.5 C), temperature source Oral, resp. rate 17, height 5' 10"  (1.778 m), weight 154 lb 3.2 oz (69.9 kg), SpO2 100 %.    HEENT: No thrush or ulcers. Resp: Lungs clear bilaterally. Cardio: Regular rate and rhythm. GI: Abdomen soft and nontender.  No hepatomegaly. Vascular: No leg edema.  Calf soft and nontender. Neuro: Vibratory sense mildly decreased over the fingertips per tuning fork exam. Skin: Palms without erythema. Port-A-Cath without erythema.   Lab Results:  Lab Results  Component Value Date   WBC 17.3 (H) 12/13/2017   HGB 10.7 (L) 10/10/2017   HCT 33.4 (L) 12/13/2017   MCV 96.8 12/13/2017   PLT 137 (L) 12/13/2017   NEUTROABS 13.4 (H) 12/13/2017    Imaging:  No results found.  Medications: I have reviewed the patient's current medications.  Assessment/Plan: 1. Adenocarcinoma of the pancreas head/neck, status post an EUS biopsy 08/02/2017 confirming adenocarcinoma ? EUS 08/02/2017-pancreas head/neck mass, abutment of the portal vein, no peripancreatic adenopathy ? MRI abdomen 07/27/2017-head/neck pancreas mass with mass-effect on the portal splenic confluence, no involvement of the celiac axis or superior mesenteric  artery, no evidence of metastatic disease ? PET scan 08/31/2017-very hypermetabolism at the pancreatic head mass, no evidence of metastatic disease. The isolated right lower lobe nodule below PET resolution, left sided renal mass with decreased activity relative to the normal adjacent kidney ? Cycle 1 FOLFIRINOX 09/16/2017 ? Cycle 2 FOLFOX 10/16/2017 ? Cycle 3 FOLFOX 10/30/2017 ? Cycle 4 FOLFIRINOX 11/14/2017 (irinotecan resumed with a dose reduction) ? Cycle 5 FOLFIRINOX 11/29/2017 (Oxaliplatin dose reduced due to thrombocytopenia) ? Cycle 6 FOLFIRINOX 12/13/2017  2. 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 Barry Dienes  9. Nausea, diarrhea, and abdominal pain-chemotherapy enteritis versus bowel obstruction versus ischemic enteritis  10. Dehydrationsecondary to nausea and diarrhea-resolved  11. History of pancytopenia secondary to chemotherapy-improved   Disposition: Mr. Maynes appears stable.  He has completed 5 cycles of systemic therapy, 2 FOLFOX and 3 FOLFIRINOX.  Plan to proceed with cycle 6 FOLFIRINOX today as scheduled.  We reviewed the CBC from today.  WBC/ANC elevated.  We will hold Neulasta with this cycle.  Platelet count is improved.  He will undergo restaging CTs chest/abdomen/pelvis 12/25/2017.  He will return for a follow-up visit 12/27/2017 to review results.  He will contact the office in the interim with any problems.  Patient seen with Dr. Benay Spice.  25 minutes were spent face-to-face at today's visit with the majority of that time involved  in counseling/coordination of care.    [name] ANP/GNP-BC   12/13/2017  12:18 PM  This was  a shared visit with [name].  Mr. Phillips Hay appears to be tolerating the FOLFIRINOX well.  He will complete cycle 6 today.  He will undergo restaging after this cycle.  I will present his case at the GI tumor conference 12/27/2017 to see if he is a candidate for resection of the pancreas tumor.  Julieanne Manson, MD

## 2017-12-13 NOTE — Telephone Encounter (Signed)
Appointments scheduled AVS/Calendar printed / contrast material given w/ instructions per 4/3 los

## 2017-12-13 NOTE — Patient Instructions (Signed)

## 2017-12-15 ENCOUNTER — Inpatient Hospital Stay: Payer: Medicare Other

## 2017-12-15 DIAGNOSIS — C250 Malignant neoplasm of head of pancreas: Secondary | ICD-10-CM

## 2017-12-15 DIAGNOSIS — R05 Cough: Secondary | ICD-10-CM | POA: Diagnosis not present

## 2017-12-15 DIAGNOSIS — J3489 Other specified disorders of nose and nasal sinuses: Secondary | ICD-10-CM | POA: Diagnosis not present

## 2017-12-15 DIAGNOSIS — N2889 Other specified disorders of kidney and ureter: Secondary | ICD-10-CM | POA: Diagnosis not present

## 2017-12-15 DIAGNOSIS — Z95828 Presence of other vascular implants and grafts: Secondary | ICD-10-CM

## 2017-12-15 DIAGNOSIS — R911 Solitary pulmonary nodule: Secondary | ICD-10-CM | POA: Diagnosis not present

## 2017-12-15 DIAGNOSIS — Z5111 Encounter for antineoplastic chemotherapy: Secondary | ICD-10-CM | POA: Diagnosis not present

## 2017-12-15 MED ORDER — HEPARIN SOD (PORK) LOCK FLUSH 100 UNIT/ML IV SOLN
500.0000 [IU] | Freq: Once | INTRAVENOUS | Status: AC
  Administered 2017-12-15: 500 [IU]
  Filled 2017-12-15: qty 5

## 2017-12-15 MED ORDER — SODIUM CHLORIDE 0.9% FLUSH
10.0000 mL | Freq: Once | INTRAVENOUS | Status: AC
  Administered 2017-12-15: 10 mL
  Filled 2017-12-15: qty 10

## 2017-12-15 NOTE — Patient Instructions (Signed)

## 2017-12-18 ENCOUNTER — Ambulatory Visit: Payer: Medicare Other | Admitting: Cardiology

## 2017-12-25 ENCOUNTER — Encounter (HOSPITAL_COMMUNITY): Payer: Self-pay

## 2017-12-25 ENCOUNTER — Ambulatory Visit (HOSPITAL_COMMUNITY)
Admission: RE | Admit: 2017-12-25 | Discharge: 2017-12-25 | Disposition: A | Discharge status: Home / Self Care | Payer: Medicare Other | Source: Ambulatory Visit | Attending: Nurse Practitioner | Admitting: Nurse Practitioner

## 2017-12-25 DIAGNOSIS — J439 Emphysema, unspecified: Secondary | ICD-10-CM | POA: Insufficient documentation

## 2017-12-25 DIAGNOSIS — I700 Atherosclerosis of aorta: Secondary | ICD-10-CM | POA: Insufficient documentation

## 2017-12-25 DIAGNOSIS — I2510 Atherosclerotic heart disease of native coronary artery without angina pectoris: Secondary | ICD-10-CM | POA: Diagnosis not present

## 2017-12-25 DIAGNOSIS — I77811 Abdominal aortic ectasia: Secondary | ICD-10-CM | POA: Diagnosis not present

## 2017-12-25 DIAGNOSIS — C259 Malignant neoplasm of pancreas, unspecified: Secondary | ICD-10-CM | POA: Diagnosis not present

## 2017-12-25 DIAGNOSIS — N2889 Other specified disorders of kidney and ureter: Secondary | ICD-10-CM | POA: Insufficient documentation

## 2017-12-25 DIAGNOSIS — R911 Solitary pulmonary nodule: Secondary | ICD-10-CM | POA: Diagnosis not present

## 2017-12-25 DIAGNOSIS — C250 Malignant neoplasm of head of pancreas: Secondary | ICD-10-CM | POA: Insufficient documentation

## 2017-12-25 MED ORDER — IOHEXOL 300 MG/ML  SOLN
100.0000 mL | Freq: Once | INTRAMUSCULAR | Status: AC | PRN
  Administered 2017-12-25: 100 mL via INTRAVENOUS

## 2017-12-27 ENCOUNTER — Inpatient Hospital Stay (HOSPITAL_BASED_OUTPATIENT_CLINIC_OR_DEPARTMENT_OTHER): Payer: Medicare Other | Admitting: Oncology

## 2017-12-27 ENCOUNTER — Telehealth: Payer: Self-pay

## 2017-12-27 VITALS — BP 122/58 | HR 87 | Temp 97.7°F | Resp 18 | Ht 70.0 in | Wt 151.7 lb

## 2017-12-27 DIAGNOSIS — N2889 Other specified disorders of kidney and ureter: Secondary | ICD-10-CM | POA: Diagnosis not present

## 2017-12-27 DIAGNOSIS — E119 Type 2 diabetes mellitus without complications: Secondary | ICD-10-CM

## 2017-12-27 DIAGNOSIS — J3489 Other specified disorders of nose and nasal sinuses: Secondary | ICD-10-CM | POA: Diagnosis not present

## 2017-12-27 DIAGNOSIS — C250 Malignant neoplasm of head of pancreas: Secondary | ICD-10-CM

## 2017-12-27 DIAGNOSIS — I252 Old myocardial infarction: Secondary | ICD-10-CM | POA: Diagnosis not present

## 2017-12-27 DIAGNOSIS — I2510 Atherosclerotic heart disease of native coronary artery without angina pectoris: Secondary | ICD-10-CM

## 2017-12-27 DIAGNOSIS — I350 Nonrheumatic aortic (valve) stenosis: Secondary | ICD-10-CM | POA: Diagnosis not present

## 2017-12-27 DIAGNOSIS — R05 Cough: Secondary | ICD-10-CM | POA: Diagnosis not present

## 2017-12-27 DIAGNOSIS — I10 Essential (primary) hypertension: Secondary | ICD-10-CM | POA: Diagnosis not present

## 2017-12-27 DIAGNOSIS — Z5111 Encounter for antineoplastic chemotherapy: Secondary | ICD-10-CM | POA: Diagnosis not present

## 2017-12-27 DIAGNOSIS — R911 Solitary pulmonary nodule: Secondary | ICD-10-CM | POA: Diagnosis not present

## 2017-12-27 NOTE — Progress Notes (Signed)
[place] OFFICE PROGRESS NOTE   Diagnosis: Pancreas cancer  INTERVAL HISTORY:   [name] returns as scheduled.  He completed another cycle of FOLFIRINOX 12/13/2017.  He reports tolerating the chemotherapy well.  Minimal diarrhea following chemotherapy.  He has a "cold "for the past 4-5 days with rhinorrhea and a cough.  No neuropathy symptoms.  Objective:  Vital signs in last 24 hours:  Blood pressure (!) 122/58, pulse 87, temperature 97.7 F (36.5 C), temperature source Oral, resp. rate 18, height 5' 10"  (1.778 m), weight 151 lb 11.2 oz (68.8 kg), SpO2 97 %.    HEENT: No thrush or ulcers Lymphatics: No cervical or supraclavicular nodes Resp: Lungs clear bilaterally Cardio: Regular rate and rhythm GI: No hepatomegaly, no mass, nontender Vascular: No leg edema Neuro: The vibratory sense is intact at the fingertips bilaterally  Portacath/PICC-without erythema  Lab Results:  Lab Results  Component Value Date   WBC 17.3 (H) 12/13/2017   HGB 10.7 (L) 10/10/2017   HCT 33.4 (L) 12/13/2017   MCV 96.8 12/13/2017   PLT 137 (L) 12/13/2017   NEUTROABS 13.4 (H) 12/13/2017    CMP     Component Value Date/Time   NA 137 12/13/2017 1100   NA 139 09/07/2017 0844   K 3.8 12/13/2017 1100   K 5.4 No visable hemolysis (H) 09/07/2017 0844   CL 106 12/13/2017 1100   CO2 24 12/13/2017 1100   CO2 27 09/07/2017 0844   GLUCOSE 94 12/13/2017 1100   GLUCOSE 227 (H) 09/07/2017 0844   BUN 13 12/13/2017 1100   BUN 29.8 (H) 09/07/2017 0844   CREATININE 0.76 12/13/2017 1100   CREATININE 1.2 09/07/2017 0844   CALCIUM 9.2 12/13/2017 1100   CALCIUM 9.8 09/07/2017 0844   PROT 6.1 (L) 12/13/2017 1100   PROT 6.9 09/07/2017 0844   ALBUMIN 3.6 12/13/2017 1100   ALBUMIN 3.9 09/07/2017 0844   AST 16 12/13/2017 1100   AST 13 09/07/2017 0844   ALT 13 12/13/2017 1100   ALT 12 09/07/2017 0844   ALKPHOS 135 12/13/2017 1100   ALKPHOS 89 09/07/2017 0844   BILITOT 0.2 12/13/2017  1100   BILITOT 0.37 09/07/2017 0844   GFRNONAA >60 12/13/2017 1100   GFRAA >60 12/13/2017 1100   CA 19-9 on 11/14/2017: 19   Ct Chest W Contrast  Result Date: 12/25/2017 CLINICAL DATA:  Pancreas neoplasm. Status post chemotherapy. New cough. Followup right lower lobe lung nodule. Known left renal mass compatible with renal cell carcinoma. EXAM: CT CHEST, ABDOMEN, AND PELVIS WITH CONTRAST TECHNIQUE: Multidetector CT imaging of the chest, abdomen and pelvis was performed following the standard protocol during bolus administration of intravenous contrast. CONTRAST:  171m OMNIPAQUE IOHEXOL 300 MG/ML  SOLN COMPARISON:  PET-CT from 08/30/2017 and CT AP from 09/28/2017. FINDINGS: CT CHEST FINDINGS Cardiovascular: Normal heart size. No pericardial effusion. Aortic atherosclerosis. Calcification within the RCA, LAD and left circumflex coronary artery. Status post aortic valve repair. Mediastinum/Nodes: Normal appearance of the thyroid gland. The trachea appears patent and is midline. No enlarged mediastinal or hilar lymph nodes. Lungs/Pleura: No pleural effusion. Mild changes of emphysema. No airspace consolidation, atelectasis or pneumothorax identified. Index nodule within the right lower lobe measures 1 cm this is stable from the previous studies dating to 07/20/17. Cluster of tree-in-bud nodules within the lateral left lower lobe are most likely post infectious/inflammatory, image 140/10. No new pulmonary nodules identified. Musculoskeletal: Spondylosis noted throughout the thoracic spine. No aggressive lytic or sclerotic bone lesions. CT ABDOMEN  PELVIS FINDINGS Hepatobiliary: No suspicious liver abnormality. No biliary dilatation. The gallbladder appears normal. Pancreas: The ill-defined mass within the head of pancreas measures 1.2 by 2.1 cm, image 186/9. This is improved from 1.8 by 2.3 cm previously. Atrophy of the body and tail of pancreas is again noted with dilatation of the main duct. Spleen: Normal in  size without focal abnormality. Adrenals/Urinary Tract: Normal appearance of the adrenal glands. Small low-density foci within the kidney are noted. Too small to characterize. Large enhancing mass upper pole of the left kidney measures 6.9 cm, image 183/9. Previously 6.8 cm. No small kidney cysts noted bilaterally. Hydronephrosis identified. Stomach/Bowel: The stomach and the small bowel loops have a normal course and caliber. Normal appearance of the proximal colon. Extensive distal colonic diverticula noted without acute inflammation. Vascular/Lymphatic: Aortic atherosclerosis. The infrarenal abdominal aorta measures 2.9 cm, image 220/9. Previously 2.6 cm. 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 No abdominal or pelvic adenopathy.  No inguinal adenopathy. Reproductive: Prostate gland enlargement noted. Other: No free fluid or fluid collections within the abdomen or pelvis. Musculoskeletal: Degenerative disc disease noted within the lumbar spine. No suspicious bone lesions. Unchanged compression fracture at the L2 level. IMPRESSION: 1. There has been mild decrease in size of head of pancreas mass compared with 09/28/2017. 2. Mass involving the upper pole of left kidney is not significantly changed in the interval compatible with renal cell carcinoma. 3. Right lower lobe pulmonary nodule is stable compared with 07/20/2017. 4. Aortic Atherosclerosis (ICD10-I70.0) and Emphysema (ICD10-J43.9). 5. Ectatic abdominal aorta measures 2.9 cm. Previously 2.6 cm. Ectatic abdominal aorta at risk for aneurysm development. Recommend followup by ultrasound in 5 years. This recommendation follows ACR consensus guidelines: White Paper of the ACR Incidental Findings Committee II on Vascular Findings. J Am Coll Radiol 2013; 10:789-794. 6. Three vessel coronary artery atherosclerotic calcifications. Electronically Signed   By: Kerby Moors M.D.   On: 12/25/2017 15:25   Ct Abdomen Pelvis W  Contrast  Result Date: 12/25/2017 CLINICAL DATA:  Pancreas neoplasm. Status post chemotherapy. New cough. Followup right lower lobe lung nodule. Known left renal mass compatible with renal cell carcinoma. EXAM: CT CHEST, ABDOMEN, AND PELVIS WITH CONTRAST TECHNIQUE: Multidetector CT imaging of the chest, abdomen and pelvis was performed following the standard protocol during bolus administration of intravenous contrast. CONTRAST:  162m OMNIPAQUE IOHEXOL 300 MG/ML  SOLN COMPARISON:  PET-CT from 08/30/2017 and CT AP from 09/28/2017. FINDINGS: CT CHEST FINDINGS Cardiovascular: Normal heart size. No pericardial effusion. Aortic atherosclerosis. Calcification within the RCA, LAD and left circumflex coronary artery. Status post aortic valve repair. Mediastinum/Nodes: Normal appearance of the thyroid gland. The trachea appears patent and is midline. No enlarged mediastinal or hilar lymph nodes. Lungs/Pleura: No pleural effusion. Mild changes of emphysema. No airspace consolidation, atelectasis or pneumothorax identified. Index nodule within the right lower lobe measures 1 cm this is stable from the previous studies dating to 07/20/17. Cluster of tree-in-bud nodules within the lateral left lower lobe are most likely post infectious/inflammatory, image 140/10. No new pulmonary nodules identified. Musculoskeletal: Spondylosis noted throughout the thoracic spine. No aggressive lytic or sclerotic bone lesions. CT ABDOMEN PELVIS FINDINGS Hepatobiliary: No suspicious liver abnormality. No biliary dilatation. The gallbladder appears normal. Pancreas: The ill-defined mass within the head of pancreas measures 1.2 by 2.1 cm, image 186/9. This is improved from 1.8 by 2.3 cm previously. Atrophy of the body and tail of pancreas is again noted with dilatation of the main  duct. Spleen: Normal in size without focal abnormality. Adrenals/Urinary Tract: Normal appearance of the adrenal glands. Small low-density foci within the kidney are  noted. Too small to characterize. Large enhancing mass upper pole of the left kidney measures 6.9 cm, image 183/9. Previously 6.8 cm. No small kidney cysts noted bilaterally. Hydronephrosis identified. Stomach/Bowel: The stomach and the small bowel loops have a normal course and caliber. Normal appearance of the proximal colon. Extensive distal colonic diverticula noted without acute inflammation. Vascular/Lymphatic: Aortic atherosclerosis. The infrarenal abdominal aorta measures 2.9 cm, image 220/9. Previously 2.6 cm. 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 No abdominal or pelvic adenopathy.  No inguinal adenopathy. Reproductive: Prostate gland enlargement noted. Other: No free fluid or fluid collections within the abdomen or pelvis. Musculoskeletal: Degenerative disc disease noted within the lumbar spine. No suspicious bone lesions. Unchanged compression fracture at the L2 level. IMPRESSION: 1. There has been mild decrease in size of head of pancreas mass compared with 09/28/2017. 2. Mass involving the upper pole of left kidney is not significantly changed in the interval compatible with renal cell carcinoma. 3. Right lower lobe pulmonary nodule is stable compared with 07/20/2017. 4. Aortic Atherosclerosis (ICD10-I70.0) and Emphysema (ICD10-J43.9). 5. Ectatic abdominal aorta measures 2.9 cm. Previously 2.6 cm. Ectatic abdominal aorta at risk for aneurysm development. Recommend followup by ultrasound in 5 years. This recommendation follows ACR consensus guidelines: White Paper of the ACR Incidental Findings Committee II on Vascular Findings. J Am Coll Radiol 2013; 10:789-794. 6. Three vessel coronary artery atherosclerotic calcifications. Electronically Signed   By: Kerby Moors M.D.   On: 12/25/2017 15:25    Medications: I have reviewed the patient's current medications.   Assessment/Plan: 1. Adenocarcinoma of the pancreas head/neck, status post an EUS biopsy  08/02/2017 confirming adenocarcinoma ? EUS 08/02/2017-pancreas head/neck mass, abutment of the portal vein, no peripancreatic adenopathy ? MRI abdomen 07/27/2017-head/neck pancreas mass with mass-effect on the portal splenic confluence, no involvement of the celiac axis or superior mesenteric artery, no evidence of metastatic disease ? PET scan 08/31/2017-very hypermetabolism at the pancreatic head mass, no evidence of metastatic disease. The isolated right lower lobe nodule below PET resolution, left sided renal mass with decreased activity relative to the normal adjacent kidney ? Cycle 1 FOLFIRINOX 09/16/2017 ? Cycle 2 FOLFOX 10/16/2017 ? Cycle 3 FOLFOX 10/30/2017 ? Cycle 4 FOLFIRINOX 11/14/2017 (irinotecan resumed with a dose reduction) ? Cycle 5 FOLFIRINOX3/20/2019 (Oxaliplatin dose reduced due to thrombocytopenia) ? Cycle 6 FOLFIRINOX 12/13/2017 ? CTs 12/25/2017-stable right lower lobe nodule, decrease in size of the pancreas head mass, stable left kidney mass  2. 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 Barry Dienes  9. Nausea, diarrhea, and abdominal pain-chemotherapy enteritis versus bowel obstruction versus ischemic enteritis  10. Dehydrationsecondary to nausea and diarrhea-resolved  11. History of pancytopenia secondary to chemotherapy-improved   Disposition: Manuel Olson has completed 6 cycles of FOLFIRINOX.  He has tolerated the chemotherapy well.  The restaging CT shows no evidence of disease progression and a decrease in the size of the pancreas head mass.  He will schedule appointment with Dr. Barry Dienes to discuss surgery.  He also plans to schedule  appointment with Dr. Alyson Ingles to discuss concurrent resection of the renal mass.  He will return for an office visit here in approximately 6 weeks.  I am available to see him in the interim as needed.    [name], MD  12/27/2017  9:14 AM

## 2017-12-27 NOTE — Telephone Encounter (Signed)
Printed avs and calender of upcoming appointment. Per 4/17 los

## 2018-01-01 DIAGNOSIS — D49512 Neoplasm of unspecified behavior of left kidney: Secondary | ICD-10-CM | POA: Diagnosis not present

## 2018-01-04 ENCOUNTER — Other Ambulatory Visit: Payer: Self-pay | Admitting: General Surgery

## 2018-01-04 DIAGNOSIS — C642 Malignant neoplasm of left kidney, except renal pelvis: Secondary | ICD-10-CM | POA: Diagnosis not present

## 2018-01-04 DIAGNOSIS — C250 Malignant neoplasm of head of pancreas: Secondary | ICD-10-CM | POA: Diagnosis not present

## 2018-01-04 NOTE — Progress Notes (Signed)
Cardiology Office Note  Date: 01/08/2018   ID: Manuel Olson, DOB 05/17/1946, MRN [id number]  PCP: Patient, No Pcp Per  Primary Cardiologist: Rozann Lesches, MD   Chief Complaint  Patient presents with  . Preoperative evaluation    History of Present Illness: Manuel Olson is a medically complex 72 y.o. male that I last saw in November 2018.  Manuel Olson had interval follow-up in the valve clinic in January of this year status post TAVR in December 2018.  Most recent echocardiogram is outlined below.  Manuel Olson is referred back to the office for a preoperative cardiac assessment.  Concurrent diagnoses include pancreatic head cancer and left-sided renal cell carcinoma. Manuel Olson continues to follow with Dr. Benay Spice in the oncology clinic, status post recent cycle of FOLFIRINOX 12/13/2017.  Manuel Olson has been evaluated by Dr. Barry Dienes for surgical intervention.  I received communication regarding the need for urgent laparoscopic Whipple and concurrent removal of left renal mass under general anesthesia.  This is not yet been scheduled.  Manuel Olson states that Manuel Olson has been doing reasonably well.  Manuel Olson noticed a significant improvement in dyspnea on exertion following TAVR.  Manuel Olson does not report any angina symptoms.  Manuel Olson states that Manuel Olson has been taking his regular cardiac medications.  Manuel Olson has lost a lot of weight in the course of chemotherapy treatment, also limited appetite.  No fevers or chills.  TAVR procedure was on August 22, 2017.  Typically Plavix would be continued through 6 months, but in light of the current situation and need for urgent surgery, it would not be unreasonable to stop his antiplatelet regimen a month early.  His surgical bleeding risk on both agents would be quite high.  Reviewed his ECG and echocardiogram from January.  Most recent LVEF was in the range of 45 to 50%.  Valve function was stable with a mild perivalvular leak and mean gradient of 16 mmHg.  Past Medical History:  Diagnosis Date   . Arthritis   . CAD (coronary artery disease)    a. 11/2016 in [place]: STEMI s/p DES to mid LAD, DES to diagonal, DES x 2 to proximal and mid RCA   . Essential hypertension   . Former smoker   . GERD (gastroesophageal reflux disease)   . Hyperlipidemia   . Pancreatic cancer ([place])   . Renal mass   . S/P TAVR (transcatheter aortic valve replacement)    a. 08/2017:  Edwards Sapien 3 THV (size 26 mm, model #[id number] , serial # [id number])  . Severe aortic stenosis    a. 08/2017: s/p TAVR by Dr. Angelena Form and Dr. Cyndia Bent.   . 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 Type 2 diabetes mellitus ([place])     Past Surgical History:  Procedure Laterality Date  . APPENDECTOMY    . EUS N/A 08/02/2017   Procedure: UPPER ENDOSCOPIC ULTRASOUND (EUS) RADIAL;  Surgeon: [name], MD;  Location: [facility];  Service: Endoscopy;  Laterality: N/A;  . EYE SURGERY Left   . HAND SURGERY Bilateral   . HERNIA REPAIR Right   . PORTACATH PLACEMENT N/A 09/15/2017   Procedure: INSERTION PORT-A-CATH;  Surgeon: [name], MD;  Location: [facility];  Service: General;  Laterality: N/A;  . RIGHT/LEFT HEART CATH AND CORONARY ANGIOGRAPHY N/A 07/05/2017   Procedure: RIGHT/LEFT HEART CATH AND CORONARY ANGIOGRAPHY;  Surgeon: [name], MD;  Location: [facility];  Service: Cardiovascular;  Laterality: N/A;  . SHOULDER ARTHROSCOPY  WITH ROTATOR CUFF REPAIR Left   . TEE WITHOUT CARDIOVERSION N/A 08/22/2017   Procedure: TRANSESOPHAGEAL ECHOCARDIOGRAM (TEE);  Surgeon: Burnell Blanks, MD;  Location: [facility];  Service: Open Heart Surgery;  Laterality: N/A;  . TRANSCATHETER AORTIC VALVE REPLACEMENT, TRANSFEMORAL N/A 08/22/2017   Procedure: TRANSCATHETER AORTIC VALVE REPLACEMENT, TRANSFEMORAL;  Surgeon: Burnell Blanks, MD;  Location: [facility];  Service: Open Heart Surgery;  Laterality: N/A;    Current Outpatient Medications  Medication Sig Dispense Refill  . aspirin EC 81 MG tablet  Take 1 tablet (81 mg total) by mouth daily. 90 tablet 3  . atorvastatin (LIPITOR) 20 MG tablet Take 1 tablet (20 mg total) by mouth daily. 90 tablet 1  . baclofen (LIORESAL) 10 MG tablet Take 0.5 tablets (5 mg total) by mouth 3 (three) times daily as needed for muscle spasms. 30 each 0  . carvedilol (COREG) 3.125 MG tablet TAKE (1) TABLET BY MOUTH TWICE A DAY WITH MEALS (BREAKFAST AND SUPPER) 180 tablet 2  . clopidogrel (PLAVIX) 75 MG tablet Take 1 tablet (75 mg total) by mouth daily. 90 tablet 1  . diphenoxylate-atropine (LOMOTIL) 2.5-0.025 MG tablet Take 2 tablets by mouth 4 (four) times daily as needed for diarrhea or loose stools. 60 tablet 0  . Insulin Glargine (BASAGLAR KWIKPEN) 100 UNIT/ML SOPN Inject 0.2 mLs (20 Units total) into the skin at bedtime. 5 pen 5  . insulin lispro (HUMALOG) 100 UNIT/ML injection Inject 0.04-0.06 mLs (4-6 Units total) into the skin 3 (three) times daily before meals. Pens. 15 mL 11  . Insulin Pen Needle (CAREFINE PEN NEEDLES) 32G X 4 MM MISC Use 1x a day 100 each 3  . lidocaine-prilocaine (EMLA) cream Apply 1 application topically as needed. Apply small amount of cream over port site 1-2 hrs prior to chemo, cover with plastic wrap. (Patient taking differently: Apply 1 application topically as needed (for recieving chemo in port site). Apply small amount of cream over port site 1-2 hrs prior to chemo, cover with plastic wrap.) 30 g 0  . lisinopril (PRINIVIL,ZESTRIL) 10 MG tablet Take 1 tablet (10 mg total) by mouth daily. 90 tablet 2  . ondansetron (ZOFRAN) 8 MG tablet Take 1 tablet (8 mg total) by mouth every 8 (eight) hours as needed for nausea or vomiting. 20 tablet 0  . prochlorperazine (COMPAZINE) 5 MG tablet Take 1 tablet (5 mg total) by mouth every 6 (six) hours as needed for nausea or vomiting. 30 tablet 0   No current facility-administered medications for this visit.    Allergies:  Patient has no known allergies.   Social History: The patient  reports  that Manuel Olson quit smoking about 20 months ago. His smoking use included cigarettes. Manuel Olson started smoking about 59 years ago. Manuel Olson has a 57.00 pack-year smoking history. Manuel Olson has never used smokeless tobacco. Manuel Olson reports that Manuel Olson does not drink alcohol or use drugs.   Family History: The patient's family history includes CAD in his brother; Hypertension in his brother; Lupus in his mother.   ROS:  Please see the history of present illness. Otherwise, complete review of systems is positive for weakness, occasional dizziness and falls.  All other systems are reviewed and negative.   Physical Exam: VS:  BP 130/74 (BP Location: Right Arm)   Pulse 61   Ht 5' 10"  (1.778 m)   Wt 154 lb (69.9 kg)   SpO2 96%   BMI 22.10 kg/m , BMI Body mass index is 22.1 kg/m.  Wt Readings  from Last 3 Encounters:  01/08/18 154 lb (69.9 kg)  12/27/17 151 lb 11.2 oz (68.8 kg)  12/13/17 154 lb 3.2 oz (69.9 kg)    General: Chronically ill-appearing male in no distress. HEENT: Conjunctiva and lids normal, oropharynx clear. Neck: Supple, no elevated JVP or carotid bruits, no thyromegaly. Lungs: Coarse breath sounds, nonlabored breathing at rest. Cardiac: Regular rate and rhythm, no S3, soft systolic murmur, no pericardial rub. Abdomen: Soft, nontender, bowel sounds present, no guarding or rebound. Extremities: No pitting edema, distal pulses 2+. Skin: Warm and dry. Musculoskeletal: No kyphosis.  Muscle wasting. Neuropsychiatric: Alert and oriented x3, affect grossly appropriate.  ECG: I personally reviewed the tracing from 08/31/2017 which showed sinus rhythm with prolonged PR interval and poor R wave progression consistent with old anterior wall infarct.  Recent Labwork: 08/17/2017: B Natriuretic Peptide 163.2 11/29/2017: Magnesium 1.7 12/13/2017: ALT 13; AST 16; BUN 13; Creatinine 0.76; Hemoglobin 10.7; Platelet Count 137; Potassium 3.8; Sodium 137     Component Value Date/Time   CHOL 102 03/16/2017 0840   TRIG 71  03/16/2017 0840   HDL 36 (L) 03/16/2017 0840   CHOLHDL 2.8 03/16/2017 0840   VLDL 14 03/16/2017 0840   LDLCALC 52 03/16/2017 0840    Other Studies Reviewed Today:  Echocardiogram 09/13/2017: Study Conclusions  - Left ventricle: The cavity size was normal. There was mild   concentric hypertrophy. Systolic function was mildly reduced. The   estimated ejection fraction was in the range of 45% to 50%. Wall   motion was normal; there were no regional wall motion   abnormalities. Doppler parameters are consistent with abnormal   left ventricular relaxation (grade 1 diastolic dysfunction).   Doppler parameters are consistent with high ventricular filling   pressure. - Aortic valve: A 34m Edwards Sapien TAVR bioprosthesis was   present. There was mild stenosis. There was mild perivalvular   regurgitation. Peak velocity (S): 296 cm/s. Mean gradient (S): 16   mm Hg. - Mitral valve: Transvalvular velocity was within the normal range.   There was no evidence for stenosis. There was no regurgitation. - Right ventricle: The cavity size was normal. Wall thickness was   normal. Systolic function was normal. - Atrial septum: No defect or patent foramen ovale was identified   by color flow Doppler.  Assessment and Plan:  1.  Preoperative cardiac evaluation in a medically complex 72year old male with multivessel CAD status post anterior infarct in March 2018 (DES to LAD, DES to diagonal, DES x2 to RCA) with stent sites patent at angiography October 2018, severe aortic stenosis status post TAVR in December 2018 (currently on aspirin and Plavix), essential hypertension with adequate blood pressure control today, hyperlipidemia on Lipitor, pancreatic head cancer undergoing current chemotherapy, and also left-sided renal cell cancer.  Plan is for him to undergo fairly urgent laparoscopic Whipple with concurrent resection of left renal mass.  Manuel Olson has been seen by Dr. BBarry Dienes  Although Manuel Olson is only in the  fifth month out from TAVR, and ideally Plavix would be continued through June, I would go ahead and have him stop aspirin and Plavix 7 days prior to planned surgical date so as not to delay things any further.  Valve function was adequate by echocardiogram in January and Manuel Olson has been clinically stable. His risk of bleeding would be quite high were Manuel Olson to continue on dual antiplatelet therapy.  There is some increased risk in reducing his antiplatelet regimen now,, and we did discuss this today.  Manuel Olson  states that Manuel Olson is willing to accept that risk, and clearly the alternative of delaying surgery may be significantly worse.  Overall his perioperative cardiac risk should still be in the intermediate range based on his clinical stability and most recent cardiac evaluations.  2.  Multivessel CAD status post anterior STEMI in March 2018.  Manuel Olson is status post DES to LAD, DES to the diagonal, and DES x2 to RCA.  Stents were found to be patent at angiography in October 2018.  Manuel Olson reports no active angina symptoms.  3.  Ischemic cardiomyopathy with LVEF 45 to 50% by most recent testing.  No evidence of fluid overload or active heart failure at this time.  4.  Severe aortic stenosis status post TAVR in December 2018.  I am forwarding copy of this note also to Dr. Angelena Form and Dr. Cyndia Bent.  5.  Mixed hyperlipidemia, on Lipitor.  6.  Essential hypertension, blood pressure control is adequate today.  Current medicines were reviewed with the patient today.  Disposition: Follow-up in 3 months.  Signed, Satira Sark, MD, [facility] 01/08/2018 12:55 PM    [facility] [facility] [address], [place], [place] [address] Phone: [phone]; Fax: [phone]

## 2018-01-05 ENCOUNTER — Other Ambulatory Visit: Payer: Self-pay

## 2018-01-05 ENCOUNTER — Telehealth: Payer: Self-pay | Admitting: Internal Medicine

## 2018-01-05 MED ORDER — INSULIN LISPRO 100 UNIT/ML ~~LOC~~ SOLN: 15.0000 [IU] | Freq: Three times a day (TID) | SUBCUTANEOUS | 11 refills | Status: DC

## 2018-01-05 MED ORDER — INSULIN LISPRO 100 UNIT/ML ~~LOC~~ SOLN: 4.0000 [IU] | Freq: Three times a day (TID) | SUBCUTANEOUS | 11 refills | Status: DC

## 2018-01-05 NOTE — Telephone Encounter (Signed)
Change made and sent to pharmacy

## 2018-01-05 NOTE — Telephone Encounter (Signed)
Sarah, please disregard previous message re: this patient. I sent it to you in error. I have routed it properly now.

## 2018-01-05 NOTE — Telephone Encounter (Signed)
Please change rx to [email or web address]

## 2018-01-05 NOTE — Addendum Note (Signed)
Addended by: Philemon Kingdom on: 01/05/2018 05:00 PM   Modules accepted: Orders

## 2018-01-05 NOTE — Telephone Encounter (Signed)
OK to change to Humalog.

## 2018-01-05 NOTE — Telephone Encounter (Signed)
Per [facility]: Pharmacy clarification-Kimberly with [facility] ph# [phone] stated Patient's insurance WILL cover Humalog but Novalog was prescribed. Pharmacy wants to know if medication can be changed.. Please call pharmacy at above ph# to advise

## 2018-01-05 NOTE — Telephone Encounter (Signed)
[facility] # [phone] calling to let us know the patients insurance has switched preferred insulins from Novolog to humalog can we please call in a new rx with dx code for the humalog

## 2018-01-08 ENCOUNTER — Ambulatory Visit (INDEPENDENT_AMBULATORY_CARE_PROVIDER_SITE_OTHER): Payer: Medicare Other | Admitting: Cardiology

## 2018-01-08 ENCOUNTER — Encounter: Payer: Self-pay | Admitting: Cardiology

## 2018-01-08 VITALS — BP 130/74 | HR 61 | Ht 70.0 in | Wt 154.0 lb

## 2018-01-08 DIAGNOSIS — E782 Mixed hyperlipidemia: Secondary | ICD-10-CM | POA: Diagnosis not present

## 2018-01-08 DIAGNOSIS — I25119 Atherosclerotic heart disease of native coronary artery with unspecified angina pectoris: Secondary | ICD-10-CM | POA: Diagnosis not present

## 2018-01-08 DIAGNOSIS — N2889 Other specified disorders of kidney and ureter: Secondary | ICD-10-CM | POA: Diagnosis not present

## 2018-01-08 DIAGNOSIS — Z952 Presence of prosthetic heart valve: Secondary | ICD-10-CM | POA: Diagnosis not present

## 2018-01-08 DIAGNOSIS — I255 Ischemic cardiomyopathy: Secondary | ICD-10-CM | POA: Diagnosis not present

## 2018-01-08 DIAGNOSIS — C259 Malignant neoplasm of pancreas, unspecified: Secondary | ICD-10-CM | POA: Diagnosis not present

## 2018-01-08 DIAGNOSIS — I10 Essential (primary) hypertension: Secondary | ICD-10-CM | POA: Diagnosis not present

## 2018-01-08 DIAGNOSIS — Z01810 Encounter for preprocedural cardiovascular examination: Secondary | ICD-10-CM

## 2018-01-08 NOTE — Patient Instructions (Addendum)
Your physician recommends that you schedule a follow-up appointment in: 3 months  with [facility]    Your physician recommends that you continue on your current medications as directed. Please refer to the Current Medication list given to you today.    If you need a refill on your cardiac medications before your next appointment, please call your pharmacy.     No lab tests or lab work ordered today       Thank you for choosing [facility] !

## 2018-01-10 DIAGNOSIS — H903 Sensorineural hearing loss, bilateral: Secondary | ICD-10-CM | POA: Diagnosis not present

## 2018-01-22 DIAGNOSIS — L820 Inflamed seborrheic keratosis: Secondary | ICD-10-CM | POA: Diagnosis not present

## 2018-01-24 ENCOUNTER — Other Ambulatory Visit: Payer: Self-pay | Admitting: Urology

## 2018-02-01 ENCOUNTER — Ambulatory Visit: Payer: Medicare Other | Admitting: Oncology

## 2018-02-01 ENCOUNTER — Inpatient Hospital Stay: Payer: Medicare Other | Attending: Oncology

## 2018-02-01 DIAGNOSIS — C250 Malignant neoplasm of head of pancreas: Secondary | ICD-10-CM | POA: Insufficient documentation

## 2018-02-01 DIAGNOSIS — Z452 Encounter for adjustment and management of vascular access device: Secondary | ICD-10-CM | POA: Insufficient documentation

## 2018-02-01 DIAGNOSIS — Z95828 Presence of other vascular implants and grafts: Secondary | ICD-10-CM

## 2018-02-01 MED ORDER — HEPARIN SOD (PORK) LOCK FLUSH 100 UNIT/ML IV SOLN
500.0000 [IU] | Freq: Once | INTRAVENOUS | Status: AC
  Administered 2018-02-01: 500 [IU]
  Filled 2018-02-01: qty 5

## 2018-02-01 MED ORDER — SODIUM CHLORIDE 0.9% FLUSH
10.0000 mL | Freq: Once | INTRAVENOUS | Status: AC
  Administered 2018-02-01: 10 mL
  Filled 2018-02-01: qty 10

## 2018-02-06 DIAGNOSIS — D49512 Neoplasm of unspecified behavior of left kidney: Secondary | ICD-10-CM | POA: Diagnosis not present

## 2018-02-08 NOTE — Pre-Procedure Instructions (Signed)
Manuel Olson  02/08/2018      [place], [place] - [address] [place] [address] Phone: [phone] Fax: [phone]    Your procedure is scheduled on February 19, 2018.  Report to [facility] Admitting at 530 AM.  Call this number if you have problems the morning of surgery:  [phone]   Remember:  No food after midnight.  You may drink clear liquids until 430 AM .  Clear liquids allowed are:   Ensure Presurgery drink,  Water, Juice (non-citric and without pulp), Carbonated beverages, Clear Tea, Black Coffee only, Plain Jell-O only, Gatorade and Plain Popsicles only   Please complete your PRE-SURGERY ENSURE that was given to before you leave your house the morning of surgery.  Please, if able, drink it in one setting. DO NOT SIP -finish by 430 AM   Take these medicines the morning of surgery with A SIP OF WATER  Baclofen (lioresal) Carvedilol (coreg) Ondansetron (zofran)-if needed Prochlorperazine (compazine)-if needed Follow your surgeon's instructions on when to hold/resume aspirin/plavix  7 days prior to surgery STOP taking any Aleve, Naproxen, Ibuprofen, Motrin, Advil, Goody's, BC's, all herbal medications, fish oil, and all vitamins   WHAT DO I DO ABOUT MY DIABETES MEDICATION?  [name] Do not take oral diabetes medicines (pills) the morning of surgery.  . THE NIGHT BEFORE SURGERY, take your normal dose of Humalog insulin (6 units) and 1/2 of your normal dose of insuline glargine (10 units).      . THE MORNING OF SURGERY,1/2 of your normal dose of insuline glargine (10 units) and no humalog insulin unless your CBG is greater than 220 mg/dL, then you may take  of your sliding scale (correction) dose of insulin (3 units).    . The day of surgery, do not take other diabetes injectables, including Byetta (exenatide), Bydureon (exenatide ER), Victoza (liraglutide), or Trulicity (dulaglutide).  . If your  CBG is greater than 220 mg/dL, you may take  of your sliding scale (correction) dose of insulin.   Reviewed and Endorsed by [facility] Patient Education Committee, August 2015   How to Manage Your Diabetes Before and After Surgery  Why is it important to control my blood sugar before and after surgery? . Improving blood sugar levels before and after surgery helps healing and can limit problems. . A way of improving blood sugar control is eating a healthy diet by: o  Eating less sugar and carbohydrates o  Increasing activity/exercise o  Talking with your doctor about reaching your blood sugar goals . High blood sugars (greater than 180 mg/dL) can raise your risk of infections and slow your recovery, so you will need to focus on controlling your diabetes during the weeks before surgery. . Make sure that the doctor who takes care of your diabetes knows about your planned surgery including the date and location.  How do I manage my blood sugar before surgery? . Check your blood sugar at least 4 times a day, starting 2 days before surgery, to make sure that the level is not too high or low. o Check your blood sugar the morning of your surgery when you wake up and every 2 hours until you get to the Short Stay unit. . If your blood sugar is less than 70 mg/dL, you will need to treat for low blood sugar: o Do not take insulin. o Treat a low blood sugar (less than 70 mg/dL) with  cup of clear juice (cranberry or apple), 4 glucose tablets, OR glucose gel. Recheck blood sugar in 15 minutes after treatment (to make sure it is greater than 70 mg/dL). If your blood sugar is not greater than 70 mg/dL on recheck, call [phone] o  for further instructions. . Report your blood sugar to the short stay nurse when you get to Short Stay.  . If you are admitted to the hospital after surgery: o Your blood sugar will be checked by the staff and you will probably be given insulin after surgery (instead of  oral diabetes medicines) to make sure you have good blood sugar levels. o The goal for blood sugar control after surgery is 80-180 mg/dL.   Do not wear jewelry, make-up or nail polish.  Do not wear lotions, powders, or perfumes, or deodorant.  Men may shave face and neck.  Do not bring valuables to the hospital.  [facility] is not responsible for any belongings or valuables.  Contacts, dentures or bridgework may not be worn into surgery.  Leave your suitcase in the car.  After surgery it may be brought to your room.  For patients admitted to the hospital, discharge time will be determined by your treatment team.  Patients discharged the day of surgery will not be allowed to drive home.   Please read over the following fact sheets that you were given. Pain Booklet, Coughing and Deep Breathing and Surgical Site Infection Prevention

## 2018-02-09 ENCOUNTER — Encounter (HOSPITAL_COMMUNITY): Payer: Self-pay

## 2018-02-09 ENCOUNTER — Encounter (HOSPITAL_COMMUNITY)
Admission: RE | Admit: 2018-02-09 | Discharge: 2018-02-09 | Disposition: A | Discharge status: Home / Self Care | Payer: Medicare Other | Source: Ambulatory Visit | Attending: General Surgery | Admitting: General Surgery

## 2018-02-09 ENCOUNTER — Other Ambulatory Visit: Payer: Self-pay

## 2018-02-09 DIAGNOSIS — Z01812 Encounter for preprocedural laboratory examination: Secondary | ICD-10-CM | POA: Insufficient documentation

## 2018-02-09 DIAGNOSIS — E785 Hyperlipidemia, unspecified: Secondary | ICD-10-CM | POA: Diagnosis not present

## 2018-02-09 DIAGNOSIS — Z955 Presence of coronary angioplasty implant and graft: Secondary | ICD-10-CM | POA: Diagnosis not present

## 2018-02-09 DIAGNOSIS — Z794 Long term (current) use of insulin: Secondary | ICD-10-CM | POA: Insufficient documentation

## 2018-02-09 DIAGNOSIS — I429 Cardiomyopathy, unspecified: Secondary | ICD-10-CM | POA: Insufficient documentation

## 2018-02-09 DIAGNOSIS — I2510 Atherosclerotic heart disease of native coronary artery without angina pectoris: Secondary | ICD-10-CM | POA: Insufficient documentation

## 2018-02-09 DIAGNOSIS — N2889 Other specified disorders of kidney and ureter: Secondary | ICD-10-CM | POA: Diagnosis not present

## 2018-02-09 DIAGNOSIS — Z0183 Encounter for blood typing: Secondary | ICD-10-CM | POA: Insufficient documentation

## 2018-02-09 DIAGNOSIS — C259 Malignant neoplasm of pancreas, unspecified: Secondary | ICD-10-CM | POA: Insufficient documentation

## 2018-02-09 DIAGNOSIS — Z79899 Other long term (current) drug therapy: Secondary | ICD-10-CM | POA: Diagnosis not present

## 2018-02-09 DIAGNOSIS — Z953 Presence of xenogenic heart valve: Secondary | ICD-10-CM | POA: Insufficient documentation

## 2018-02-09 DIAGNOSIS — Z7902 Long term (current) use of antithrombotics/antiplatelets: Secondary | ICD-10-CM | POA: Diagnosis not present

## 2018-02-09 DIAGNOSIS — Z8673 Personal history of transient ischemic attack (TIA), and cerebral infarction without residual deficits: Secondary | ICD-10-CM | POA: Diagnosis not present

## 2018-02-09 DIAGNOSIS — I252 Old myocardial infarction: Secondary | ICD-10-CM | POA: Insufficient documentation

## 2018-02-09 DIAGNOSIS — I10 Essential (primary) hypertension: Secondary | ICD-10-CM | POA: Diagnosis not present

## 2018-02-09 DIAGNOSIS — Z7982 Long term (current) use of aspirin: Secondary | ICD-10-CM | POA: Diagnosis not present

## 2018-02-09 DIAGNOSIS — E119 Type 2 diabetes mellitus without complications: Secondary | ICD-10-CM | POA: Insufficient documentation

## 2018-02-09 HISTORY — DX: Pneumonia, unspecified organism: J18.9

## 2018-02-09 LAB — HEMOGLOBIN A1C
Hgb A1c MFr Bld: 6.4 % — ABNORMAL HIGH (ref 4.8–5.6)
Mean Plasma Glucose: 136.98 mg/dL

## 2018-02-09 LAB — CBC WITH DIFFERENTIAL/PLATELET
Abs Immature Granulocytes: 0 10*3/uL (ref 0.0–0.1)
Basophils Absolute: 0 10*3/uL (ref 0.0–0.1)
Basophils Relative: 0 %
Eosinophils Absolute: 0.2 10*3/uL (ref 0.0–0.7)
Eosinophils Relative: 3 %
HCT: 35.8 % — ABNORMAL LOW (ref 39.0–52.0)
Hemoglobin: 11.3 g/dL — ABNORMAL LOW (ref 13.0–17.0)
Immature Granulocytes: 0 %
Lymphocytes Relative: 21 %
Lymphs Abs: 1.5 10*3/uL (ref 0.7–4.0)
MCH: 31.1 pg (ref 26.0–34.0)
MCHC: 31.6 g/dL (ref 30.0–36.0)
MCV: 98.6 fL (ref 78.0–100.0)
Monocytes Absolute: 0.9 10*3/uL (ref 0.1–1.0)
Monocytes Relative: 13 %
Neutro Abs: 4.4 10*3/uL (ref 1.7–7.7)
Neutrophils Relative %: 63 %
Platelets: 134 10*3/uL — ABNORMAL LOW (ref 150–400)
RBC: 3.63 MIL/uL — ABNORMAL LOW (ref 4.22–5.81)
RDW: 15.5 % (ref 11.5–15.5)
WBC: 7.1 10*3/uL (ref 4.0–10.5)

## 2018-02-09 LAB — PROTIME-INR
INR: 1.03
Prothrombin Time: 13.4 seconds (ref 11.4–15.2)

## 2018-02-09 LAB — COMPREHENSIVE METABOLIC PANEL
ALT: 11 U/L — ABNORMAL LOW (ref 17–63)
AST: 15 U/L (ref 15–41)
Albumin: 4 g/dL (ref 3.5–5.0)
Alkaline Phosphatase: 71 U/L (ref 38–126)
Anion gap: 7 (ref 5–15)
BUN: 22 mg/dL — ABNORMAL HIGH (ref 6–20)
CO2: 25 mmol/L (ref 22–32)
Calcium: 9.5 mg/dL (ref 8.9–10.3)
Chloride: 107 mmol/L (ref 101–111)
Creatinine, Ser: 0.92 mg/dL (ref 0.61–1.24)
GFR calc Af Amer: >60 mL/min (ref >60)
GFR calc non Af Amer: >60 mL/min (ref >60)
Glucose, Bld: 100 mg/dL — ABNORMAL HIGH (ref 65–99)
Potassium: 4.1 mmol/L (ref 3.5–5.1)
Sodium: 139 mmol/L (ref 135–145)
Total Bilirubin: 0.4 mg/dL (ref 0.3–1.2)
Total Protein: 6.6 g/dL (ref 6.5–8.1)

## 2018-02-09 LAB — GLUCOSE, CAPILLARY: Glucose-Capillary: 84 mg/dL (ref 65–99)

## 2018-02-09 LAB — PREPARE RBC (CROSSMATCH)

## 2018-02-09 NOTE — Progress Notes (Addendum)
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 (all labs ordered are listed, but only abnormal results are displayed)  Labs Reviewed  CBC WITH DIFFERENTIAL/PLATELET - Abnormal; Notable for the following components:      Result Value   RBC 3.63 (*)    Hemoglobin 11.3 (*)    HCT 35.8 (*)    Platelets 134 (*)    All other components within normal limits  COMPREHENSIVE METABOLIC PANEL - Abnormal; Notable for the following components:   Glucose, Bld 100 (*)    BUN 22 (*)    ALT 11 (*)    All other components within normal limits  HEMOGLOBIN A1C - Abnormal; Notable for the following components:   Hgb A1c MFr Bld 6.4 (*)    All other components within normal limits  GLUCOSE, CAPILLARY  PROTIME-INR  PREPARE RBC (CROSSMATCH)  TYPE AND SCREEN     IMAGES:  CT chest 12/25/17:  1. There has been mild decrease in size of head of  pancreas mass compared with 09/28/2017. 2. Mass involving the upper pole of left kidney is not significantly changed in the interval compatible with renal cell carcinoma. 3. Right lower lobe pulmonary nodule is stable compared with 07/20/2017. 4. Aortic Atherosclerosis and Emphysema. 5. Ectatic abdominal aorta measures 2.9 cm. Previously 2.6 cm. Ectatic abdominal aorta at risk for aneurysm development. Recommend followup by ultrasound in 5 years.  6. Three vessel coronary artery atherosclerotic calcifications  1 view CXR 09/15/17: Left Port-A-Cath tip in the SVC.  No pneumothorax   EKG 08/31/17: Sinus rhythm with first-degree AV block.  Possible LA enlargement.  Cannot rule out anterior infarct, age undetermined.  T wave abnormality, consider lateral ischemia.   CV:  Echo 09/13/17:  - Left ventricle: The cavity size was normal. There was mild concentric hypertrophy. Systolic function was mildly reduced. The estimated ejection fraction was in the range of 45% to 50%. Wall motion was normal; there were no regional wall motion abnormalities. Doppler parameters are consistent with abnormal left ventricular relaxation (grade 1 diastolic dysfunction). Doppler parameters are consistent with high ventricular filling pressure. - Aortic valve: A 15m Edwards Sapien TAVR bioprosthesis was present. There was  mild stenosis. There was mild perivalvular regurgitation. Peak velocity (S): 296 cm/s. Mean gradient (S): 16 mm Hg. - Mitral valve: Transvalvular velocity was within the normal range. There was no evidence for stenosis. There was no regurgitation. - Right ventricle: The cavity size was normal. Wall thickness was normal. Systolic function was normal. - Atrial septum: No defect or patent foramen ovale was identified by color flow Doppler.  Cardiac cath 07/05/17:  1. Multivessel coronary artery disease with continued patency of the stented segments in the LAD, first diagonal branch, and mid RCA 2. Mild  diffuse nonobstructive residual CAD ([place] 50%; mid RCA 20%) 3. Normal right heart hemodynamics 4. Severe aortic stenosis with a peak to peak gradient of 42 mmHg and a mean gradient of 33 mmHg    Past Medical History:  Diagnosis Date  . Arthritis   . CAD (coronary artery disease)    a. 11/2016 in [place]: STEMI s/p DES to mid LAD, DES to diagonal, DES x 2 to proximal and mid RCA   . Essential hypertension   . Former smoker   . GERD (gastroesophageal reflux disease)   . Heart murmur   . Hyperlipidemia   . Myocardial infarction ([place])   . Pancreatic cancer ([place])   . Pneumonia   . Renal mass   . S/P TAVR (transcatheter aortic valve replacement)    a. 08/2017:  Edwards Sapien 3 THV (size 26 mm, model #9600CM26A , serial # [id number])  . Severe aortic stenosis    a. 08/2017: s/p TAVR by Dr. Angelena Form and Dr. Cyndia Bent.   . 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 Type 2 diabetes mellitus ([place])     Past Surgical History:  Procedure Laterality Date  . APPENDECTOMY    . CARDIAC CATHETERIZATION    . EUS N/A 08/02/2017   Procedure: UPPER ENDOSCOPIC ULTRASOUND (EUS) RADIAL;  Surgeon: [name], MD;  Location: [facility] ENDOSCOPY;  Service: Endoscopy;  Laterality: N/A;  . EYE SURGERY Left   . HAND SURGERY Bilateral   . HERNIA REPAIR Right   . PORTACATH PLACEMENT N/A 09/15/2017   Procedure: INSERTION PORT-A-CATH;  Surgeon: [name], MD;  Location: [facility];  Service: General;  Laterality: N/A;  . RIGHT/LEFT HEART CATH AND CORONARY ANGIOGRAPHY N/A 07/05/2017   Procedure: RIGHT/LEFT HEART CATH AND CORONARY ANGIOGRAPHY;  Surgeon: [name], MD;  Location: [facility];  Service: Cardiovascular;  Laterality: N/A;  . SHOULDER ARTHROSCOPY WITH ROTATOR CUFF REPAIR Left   . TEE WITHOUT CARDIOVERSION N/A 08/22/2017   Procedure: TRANSESOPHAGEAL ECHOCARDIOGRAM (TEE);  Surgeon: Burnell Blanks, MD;  Location: [facility];  Service: Open Heart Surgery;  Laterality: N/A;  .  TRANSCATHETER AORTIC VALVE REPLACEMENT, TRANSFEMORAL N/A 08/22/2017   Procedure: TRANSCATHETER AORTIC VALVE REPLACEMENT, TRANSFEMORAL;  Surgeon: Burnell Blanks, MD;  Location: [facility];  Service: Open Heart Surgery;  Laterality: N/A;    MEDICATIONS: . aspirin EC 81 MG tablet  . atorvastatin (LIPITOR) 20 MG tablet  . baclofen (LIORESAL) 10 MG tablet  . carvedilol (COREG) 3.125 MG tablet  . clopidogrel (PLAVIX) 75 MG tablet  . diphenoxylate-atropine (LOMOTIL) 2.5-0.025 MG tablet  . Insulin Glargine (BASAGLAR KWIKPEN) 100 UNIT/ML SOPN  . insulin lispro (HUMALOG) 100 UNIT/ML injection  . Insulin Pen Needle (CAREFINE PEN NEEDLES) 32G X 4 MM MISC  . lidocaine-prilocaine (EMLA) cream  . lisinopril (PRINIVIL,ZESTRIL) 10 MG tablet  . ondansetron (ZOFRAN) 8 MG tablet  . prochlorperazine (COMPAZINE) 5 MG tablet  No current facility-administered medications for this encounter.    - ASA and plavix to be stopped 7 days prior to surgery   If no changes, I anticipate pt can proceed with surgery as scheduled.   Willeen Cass, FNP-BC [facility] Short Stay Surgical Center/Anesthesiology Phone: [phone] 02/09/2018 3:25 PM

## 2018-02-09 NOTE — Progress Notes (Signed)
Denies having a PCP cardiologist is Dr. Domenic Polite Endocrinology is Dr Cruzita Lederer Denies chest pain, cough, or fever. Reports he will stop aspirin and plavix 02-13-18 Reports fasting CBG's run 80's-90's Wife states they have been instructed about ERAS and Bowel prep Willeen Cass, FNP into speak to pt about Epidural

## 2018-02-12 ENCOUNTER — Telehealth: Payer: Self-pay | Admitting: Internal Medicine

## 2018-02-12 ENCOUNTER — Telehealth: Payer: Self-pay | Admitting: Cardiology

## 2018-02-12 NOTE — Telephone Encounter (Signed)
FYI

## 2018-02-12 NOTE — Telephone Encounter (Signed)
Patients wife stated that she wanted Dr to know he is going into surgery on June 10th.   This FYI.

## 2018-02-12 NOTE — Telephone Encounter (Signed)
Noted  

## 2018-02-12 NOTE — Telephone Encounter (Signed)
Per phone call from pt's wife- she wanted to make Dr. Domenic Polite aware that Manuel Olson is going to have surgery on Monday 02/19/18 at [facility]

## 2018-02-12 NOTE — Telephone Encounter (Signed)
I will FYI message to Dr Domenic Polite

## 2018-02-18 ENCOUNTER — Encounter (HOSPITAL_COMMUNITY): Payer: Self-pay | Admitting: Certified Registered Nurse Anesthetist

## 2018-02-19 ENCOUNTER — Inpatient Hospital Stay (HOSPITAL_COMMUNITY): Payer: Medicare Other | Admitting: Emergency Medicine

## 2018-02-19 ENCOUNTER — Encounter (HOSPITAL_COMMUNITY)
Admission: RE | Disposition: A | Discharge status: Rehabilitation Facility | Payer: Self-pay | Source: Ambulatory Visit | Attending: General Surgery

## 2018-02-19 ENCOUNTER — Inpatient Hospital Stay (HOSPITAL_COMMUNITY)
Admission: RE | Admit: 2018-02-19 | Discharge: 2018-02-27 | DRG: 406 | Disposition: A | Discharge status: Rehabilitation Facility | Payer: Medicare Other | Source: Ambulatory Visit | Attending: General Surgery | Admitting: General Surgery

## 2018-02-19 ENCOUNTER — Inpatient Hospital Stay (HOSPITAL_COMMUNITY): Payer: Medicare Other | Admitting: Anesthesiology

## 2018-02-19 DIAGNOSIS — D62 Acute posthemorrhagic anemia: Secondary | ICD-10-CM | POA: Diagnosis not present

## 2018-02-19 DIAGNOSIS — Z8507 Personal history of malignant neoplasm of pancreas: Secondary | ICD-10-CM | POA: Diagnosis not present

## 2018-02-19 DIAGNOSIS — K219 Gastro-esophageal reflux disease without esophagitis: Secondary | ICD-10-CM | POA: Diagnosis present

## 2018-02-19 DIAGNOSIS — Z794 Long term (current) use of insulin: Secondary | ICD-10-CM | POA: Diagnosis not present

## 2018-02-19 DIAGNOSIS — H9190 Unspecified hearing loss, unspecified ear: Secondary | ICD-10-CM | POA: Diagnosis present

## 2018-02-19 DIAGNOSIS — C779 Secondary and unspecified malignant neoplasm of lymph node, unspecified: Secondary | ICD-10-CM | POA: Diagnosis present

## 2018-02-19 DIAGNOSIS — C642 Malignant neoplasm of left kidney, except renal pelvis: Secondary | ICD-10-CM | POA: Diagnosis not present

## 2018-02-19 DIAGNOSIS — E119 Type 2 diabetes mellitus without complications: Secondary | ICD-10-CM | POA: Diagnosis not present

## 2018-02-19 DIAGNOSIS — N2889 Other specified disorders of kidney and ureter: Secondary | ICD-10-CM | POA: Diagnosis not present

## 2018-02-19 DIAGNOSIS — I252 Old myocardial infarction: Secondary | ICD-10-CM | POA: Diagnosis not present

## 2018-02-19 DIAGNOSIS — E11649 Type 2 diabetes mellitus with hypoglycemia without coma: Secondary | ICD-10-CM | POA: Diagnosis not present

## 2018-02-19 DIAGNOSIS — Z7902 Long term (current) use of antithrombotics/antiplatelets: Secondary | ICD-10-CM

## 2018-02-19 DIAGNOSIS — Z905 Acquired absence of kidney: Secondary | ICD-10-CM | POA: Diagnosis not present

## 2018-02-19 DIAGNOSIS — C250 Malignant neoplasm of head of pancreas: Secondary | ICD-10-CM | POA: Diagnosis not present

## 2018-02-19 DIAGNOSIS — Z79899 Other long term (current) drug therapy: Secondary | ICD-10-CM

## 2018-02-19 DIAGNOSIS — E785 Hyperlipidemia, unspecified: Secondary | ICD-10-CM | POA: Diagnosis not present

## 2018-02-19 DIAGNOSIS — Z8249 Family history of ischemic heart disease and other diseases of the circulatory system: Secondary | ICD-10-CM | POA: Diagnosis not present

## 2018-02-19 DIAGNOSIS — E1121 Type 2 diabetes mellitus with diabetic nephropathy: Secondary | ICD-10-CM | POA: Diagnosis not present

## 2018-02-19 DIAGNOSIS — D3002 Benign neoplasm of left kidney: Secondary | ICD-10-CM | POA: Diagnosis present

## 2018-02-19 DIAGNOSIS — N179 Acute kidney failure, unspecified: Secondary | ICD-10-CM | POA: Diagnosis not present

## 2018-02-19 DIAGNOSIS — R5381 Other malaise: Secondary | ICD-10-CM | POA: Diagnosis not present

## 2018-02-19 DIAGNOSIS — Z87891 Personal history of nicotine dependence: Secondary | ICD-10-CM

## 2018-02-19 DIAGNOSIS — E1151 Type 2 diabetes mellitus with diabetic peripheral angiopathy without gangrene: Secondary | ICD-10-CM | POA: Diagnosis not present

## 2018-02-19 DIAGNOSIS — I959 Hypotension, unspecified: Secondary | ICD-10-CM | POA: Diagnosis present

## 2018-02-19 DIAGNOSIS — Z955 Presence of coronary angioplasty implant and graft: Secondary | ICD-10-CM

## 2018-02-19 DIAGNOSIS — D49512 Neoplasm of unspecified behavior of left kidney: Secondary | ICD-10-CM | POA: Diagnosis not present

## 2018-02-19 DIAGNOSIS — D696 Thrombocytopenia, unspecified: Secondary | ICD-10-CM | POA: Diagnosis present

## 2018-02-19 DIAGNOSIS — I10 Essential (primary) hypertension: Secondary | ICD-10-CM | POA: Diagnosis not present

## 2018-02-19 DIAGNOSIS — R627 Adult failure to thrive: Secondary | ICD-10-CM | POA: Diagnosis present

## 2018-02-19 DIAGNOSIS — Z8673 Personal history of transient ischemic attack (TIA), and cerebral infarction without residual deficits: Secondary | ICD-10-CM

## 2018-02-19 DIAGNOSIS — I2510 Atherosclerotic heart disease of native coronary artery without angina pectoris: Secondary | ICD-10-CM | POA: Diagnosis present

## 2018-02-19 DIAGNOSIS — C772 Secondary and unspecified malignant neoplasm of intra-abdominal lymph nodes: Secondary | ICD-10-CM | POA: Diagnosis not present

## 2018-02-19 DIAGNOSIS — Z952 Presence of prosthetic heart valve: Secondary | ICD-10-CM

## 2018-02-19 DIAGNOSIS — C259 Malignant neoplasm of pancreas, unspecified: Secondary | ICD-10-CM | POA: Diagnosis not present

## 2018-02-19 DIAGNOSIS — D638 Anemia in other chronic diseases classified elsewhere: Secondary | ICD-10-CM | POA: Diagnosis not present

## 2018-02-19 DIAGNOSIS — Z7982 Long term (current) use of aspirin: Secondary | ICD-10-CM | POA: Diagnosis not present

## 2018-02-19 DIAGNOSIS — G8918 Other acute postprocedural pain: Secondary | ICD-10-CM | POA: Diagnosis not present

## 2018-02-19 DIAGNOSIS — Z9221 Personal history of antineoplastic chemotherapy: Secondary | ICD-10-CM

## 2018-02-19 DIAGNOSIS — Z9889 Other specified postprocedural states: Secondary | ICD-10-CM

## 2018-02-19 HISTORY — PX: WHIPPLE PROCEDURE: SHX2667

## 2018-02-19 HISTORY — PX: NEPHRECTOMY: SHX65

## 2018-02-19 HISTORY — PX: LAPAROSCOPY: SHX197

## 2018-02-19 LAB — POCT I-STAT 4, (NA,K, GLUC, HGB,HCT)
Glucose, Bld: 157 mg/dL — ABNORMAL HIGH (ref 65–99)
HCT: 32 % — ABNORMAL LOW (ref 39.0–52.0)
Hemoglobin: 10.9 g/dL — ABNORMAL LOW (ref 13.0–17.0)
Potassium: 5 mmol/L (ref 3.5–5.1)
Sodium: 135 mmol/L (ref 135–145)

## 2018-02-19 LAB — POCT I-STAT 7, (LYTES, BLD GAS, ICA,H+H)
Acid-base deficit: 3 mmol/L — ABNORMAL HIGH (ref 0.0–2.0)
Acid-base deficit: 3 mmol/L — ABNORMAL HIGH (ref 0.0–2.0)
Bicarbonate: 25 mmol/L (ref 20.0–28.0)
Bicarbonate: 24.1 mmol/L (ref 20.0–28.0)
Calcium, Ion: 1.2 mmol/L (ref 1.15–1.40)
Calcium, Ion: 1.13 mmol/L — ABNORMAL LOW (ref 1.15–1.40)
HCT: 29 % — ABNORMAL LOW (ref 39.0–52.0)
HCT: 25 % — ABNORMAL LOW (ref 39.0–52.0)
Hemoglobin: 9.9 g/dL — ABNORMAL LOW (ref 13.0–17.0)
Hemoglobin: 8.5 g/dL — ABNORMAL LOW (ref 13.0–17.0)
O2 Saturation: 100 %
O2 Saturation: 100 %
Patient temperature: 36.8
Patient temperature: 36.9
Potassium: 5.1 mmol/L (ref 3.5–5.1)
Potassium: 5.4 mmol/L — ABNORMAL HIGH (ref 3.5–5.1)
Sodium: 135 mmol/L (ref 135–145)
Sodium: 134 mmol/L — ABNORMAL LOW (ref 135–145)
TCO2: 27 mmol/L (ref 22–32)
TCO2: 26 mmol/L (ref 22–32)
pCO2 arterial: 57.3 mmHg — ABNORMAL HIGH (ref 32.0–48.0)
pCO2 arterial: 51.1 mmHg — ABNORMAL HIGH (ref 32.0–48.0)
pH, Arterial: 7.247 — ABNORMAL LOW (ref 7.350–7.450)
pH, Arterial: 7.281 — ABNORMAL LOW (ref 7.350–7.450)
pO2, Arterial: 227 mmHg — ABNORMAL HIGH (ref 83.0–108.0)
pO2, Arterial: 225 mmHg — ABNORMAL HIGH (ref 83.0–108.0)

## 2018-02-19 LAB — CBC
HCT: 25.7 % — ABNORMAL LOW (ref 39.0–52.0)
Hemoglobin: 8.1 g/dL — ABNORMAL LOW (ref 13.0–17.0)
MCH: 31.3 pg (ref 26.0–34.0)
MCHC: 31.5 g/dL (ref 30.0–36.0)
MCV: 99.2 fL (ref 78.0–100.0)
Platelets: 99 10*3/uL — ABNORMAL LOW (ref 150–400)
RBC: 2.59 MIL/uL — ABNORMAL LOW (ref 4.22–5.81)
RDW: 14.6 % (ref 11.5–15.5)
WBC: 14.2 10*3/uL — ABNORMAL HIGH (ref 4.0–10.5)

## 2018-02-19 LAB — GLUCOSE, CAPILLARY
Glucose-Capillary: 146 mg/dL — ABNORMAL HIGH (ref 65–99)
Glucose-Capillary: 217 mg/dL — ABNORMAL HIGH (ref 65–99)
Glucose-Capillary: 259 mg/dL — ABNORMAL HIGH (ref 65–99)

## 2018-02-19 LAB — CREATININE, SERUM
Creatinine, Ser: 1.5 mg/dL — ABNORMAL HIGH (ref 0.61–1.24)
GFR calc Af Amer: 51 mL/min — ABNORMAL LOW (ref >60)
GFR calc non Af Amer: 44 mL/min — ABNORMAL LOW (ref >60)

## 2018-02-19 LAB — PREPARE RBC (CROSSMATCH)

## 2018-02-19 LAB — MRSA PCR SCREENING: MRSA by PCR: NEGATIVE

## 2018-02-19 SURGERY — LAPAROSCOPY, DIAGNOSTIC
Anesthesia: Epidural | Site: Abdomen

## 2018-02-19 MED ORDER — BUPIVACAINE-EPINEPHRINE (PF) 0.25% -1:200000 IJ SOLN
INTRAMUSCULAR | Status: AC
  Filled 2018-02-19: qty 30

## 2018-02-19 MED ORDER — DIPHENHYDRAMINE HCL 12.5 MG/5ML PO ELIX: 12.5000 mg | ORAL_SOLUTION | Freq: Four times a day (QID) | ORAL | Status: DC | PRN

## 2018-02-19 MED ORDER — LIDOCAINE HCL (PF) 1 % IJ SOLN
INTRAMUSCULAR | Status: AC
  Filled 2018-02-19: qty 30

## 2018-02-19 MED ORDER — SODIUM CHLORIDE 0.9 % IV SOLN: Freq: Once | INTRAVENOUS | Status: DC

## 2018-02-19 MED ORDER — EPHEDRINE SULFATE 50 MG/ML IJ SOLN
INTRAMUSCULAR | Status: AC
  Filled 2018-02-19: qty 1

## 2018-02-19 MED ORDER — ACETAMINOPHEN 650 MG RE SUPP: 650.0000 mg | Freq: Four times a day (QID) | RECTAL | Status: DC | PRN

## 2018-02-19 MED ORDER — GABAPENTIN 300 MG PO CAPS
ORAL_CAPSULE | ORAL | Status: AC
  Filled 2018-02-19: qty 1

## 2018-02-19 MED ORDER — ROPIVACAINE HCL 2 MG/ML IJ SOLN
10.0000 mL/h | INTRAMUSCULAR | Status: DC
  Administered 2018-02-20 (×2): 10 mL/h via EPIDURAL
  Filled 2018-02-19 (×7): qty 200

## 2018-02-19 MED ORDER — ACETAMINOPHEN 160 MG/5ML PO SOLN: 325.0000 mg | ORAL | Status: DC | PRN

## 2018-02-19 MED ORDER — SODIUM CHLORIDE 0.9% FLUSH: 9.0000 mL | INTRAVENOUS | Status: DC | PRN

## 2018-02-19 MED ORDER — DEXAMETHASONE SODIUM PHOSPHATE 10 MG/ML IJ SOLN
INTRAMUSCULAR | Status: DC | PRN
  Administered 2018-02-19: 5 mg via INTRAVENOUS

## 2018-02-19 MED ORDER — DEXTROSE 5 % IV SOLN
500.0000 mg | Freq: Three times a day (TID) | INTRAVENOUS | Status: DC | PRN
  Filled 2018-02-19: qty 5

## 2018-02-19 MED ORDER — SUGAMMADEX SODIUM 200 MG/2ML IV SOLN
INTRAVENOUS | Status: DC | PRN
  Administered 2018-02-19: 150 mg via INTRAVENOUS

## 2018-02-19 MED ORDER — CEFAZOLIN SODIUM-DEXTROSE 2-4 GM/100ML-% IV SOLN
INTRAVENOUS | Status: AC
  Filled 2018-02-19: qty 100

## 2018-02-19 MED ORDER — ACETAMINOPHEN 500 MG PO TABS
ORAL_TABLET | ORAL | Status: AC
  Filled 2018-02-19: qty 2

## 2018-02-19 MED ORDER — HEMOSTATIC AGENTS (NO CHARGE) OPTIME
TOPICAL | Status: DC | PRN
  Administered 2018-02-19 (×2): 1 via TOPICAL

## 2018-02-19 MED ORDER — DIPHENHYDRAMINE HCL 50 MG/ML IJ SOLN: 12.5000 mg | Freq: Four times a day (QID) | INTRAMUSCULAR | Status: DC | PRN

## 2018-02-19 MED ORDER — ARTIFICIAL TEARS OPHTHALMIC OINT
TOPICAL_OINTMENT | OPHTHALMIC | Status: AC
  Filled 2018-02-19: qty 3.5

## 2018-02-19 MED ORDER — LACTATED RINGERS IV SOLN
INTRAVENOUS | Status: DC | PRN
  Administered 2018-02-19 (×2): via INTRAVENOUS

## 2018-02-19 MED ORDER — ONDANSETRON HCL 4 MG/2ML IJ SOLN: 4.0000 mg | Freq: Four times a day (QID) | INTRAMUSCULAR | Status: DC | PRN

## 2018-02-19 MED ORDER — ALBUMIN HUMAN 5 % IV SOLN
INTRAVENOUS | Status: DC | PRN
  Administered 2018-02-19 (×2): via INTRAVENOUS

## 2018-02-19 MED ORDER — GABAPENTIN 300 MG PO CAPS
300.0000 mg | ORAL_CAPSULE | ORAL | Status: AC
  Administered 2018-02-19: 300 mg via ORAL

## 2018-02-19 MED ORDER — SODIUM CHLORIDE 0.9 % IJ SOLN
INTRAMUSCULAR | Status: AC
  Filled 2018-02-19: qty 10

## 2018-02-19 MED ORDER — NALOXONE HCL 0.4 MG/ML IJ SOLN: 0.4000 mg | INTRAMUSCULAR | Status: DC | PRN

## 2018-02-19 MED ORDER — ROCURONIUM BROMIDE 10 MG/ML (PF) SYRINGE
PREFILLED_SYRINGE | INTRAVENOUS | Status: AC
  Filled 2018-02-19: qty 5

## 2018-02-19 MED ORDER — CHLORHEXIDINE GLUCONATE CLOTH 2 % EX PADS: 6.0000 | MEDICATED_PAD | Freq: Once | CUTANEOUS | Status: DC

## 2018-02-19 MED ORDER — MIDAZOLAM HCL 2 MG/2ML IJ SOLN
INTRAMUSCULAR | Status: AC
  Filled 2018-02-19: qty 2

## 2018-02-19 MED ORDER — LIDOCAINE HCL 1 % IJ SOLN
INTRAMUSCULAR | Status: AC
  Filled 2018-02-19: qty 20

## 2018-02-19 MED ORDER — FENTANYL CITRATE (PF) 100 MCG/2ML IJ SOLN: 25.0000 ug | INTRAMUSCULAR | Status: DC | PRN

## 2018-02-19 MED ORDER — DEXTROSE 5 % IV SOLN
INTRAVENOUS | Status: DC | PRN
  Administered 2018-02-19: 25 ug/min via INTRAVENOUS
  Administered 2018-02-19: 13:00:00 via INTRAVENOUS

## 2018-02-19 MED ORDER — ONDANSETRON 4 MG PO TBDP: 4.0000 mg | ORAL_TABLET | Freq: Four times a day (QID) | ORAL | Status: DC | PRN

## 2018-02-19 MED ORDER — ROCURONIUM BROMIDE 100 MG/10ML IV SOLN
INTRAVENOUS | Status: DC | PRN
  Administered 2018-02-19: 70 mg via INTRAVENOUS
  Administered 2018-02-19: 20 mg via INTRAVENOUS
  Administered 2018-02-19: 30 mg via INTRAVENOUS
  Administered 2018-02-19: 20 mg via INTRAVENOUS

## 2018-02-19 MED ORDER — PROPOFOL 10 MG/ML IV BOLUS
INTRAVENOUS | Status: AC
  Filled 2018-02-19: qty 20

## 2018-02-19 MED ORDER — FENTANYL CITRATE (PF) 100 MCG/2ML IJ SOLN
INTRAMUSCULAR | Status: DC | PRN
  Administered 2018-02-19: 100 ug via INTRAVENOUS
  Administered 2018-02-19 (×2): 50 ug via INTRAVENOUS

## 2018-02-19 MED ORDER — EPHEDRINE SULFATE 50 MG/ML IJ SOLN
INTRAMUSCULAR | Status: DC | PRN
  Administered 2018-02-19 (×2): 5 mg via INTRAVENOUS

## 2018-02-19 MED ORDER — LIDOCAINE-EPINEPHRINE (PF) 1.5 %-1:200000 IJ SOLN
INTRAMUSCULAR | Status: DC | PRN
  Administered 2018-02-19: 6 mL via EPIDURAL

## 2018-02-19 MED ORDER — PHENYLEPHRINE 40 MCG/ML (10ML) SYRINGE FOR IV PUSH (FOR BLOOD PRESSURE SUPPORT)
PREFILLED_SYRINGE | INTRAVENOUS | Status: AC
  Filled 2018-02-19: qty 10

## 2018-02-19 MED ORDER — LIDOCAINE HCL (CARDIAC) PF 100 MG/5ML IV SOSY
PREFILLED_SYRINGE | INTRAVENOUS | Status: DC | PRN
  Administered 2018-02-19: 60 mg via INTRAVENOUS

## 2018-02-19 MED ORDER — DEXAMETHASONE SODIUM PHOSPHATE 10 MG/ML IJ SOLN
INTRAMUSCULAR | Status: AC
  Filled 2018-02-19: qty 1

## 2018-02-19 MED ORDER — OXYCODONE HCL 5 MG PO TABS: 5.0000 mg | ORAL_TABLET | Freq: Once | ORAL | Status: DC | PRN

## 2018-02-19 MED ORDER — INSULIN GLARGINE 100 UNIT/ML ~~LOC~~ SOLN
10.0000 [IU] | Freq: Every day | SUBCUTANEOUS | Status: DC
  Administered 2018-02-19: 10 [IU] via SUBCUTANEOUS
  Filled 2018-02-19: qty 0.1

## 2018-02-19 MED ORDER — CEFAZOLIN SODIUM 1 G IJ SOLR
INTRAMUSCULAR | Status: AC
  Filled 2018-02-19: qty 20

## 2018-02-19 MED ORDER — LACTATED RINGERS IV SOLN
INTRAVENOUS | Status: DC | PRN
  Administered 2018-02-19: 07:00:00 via INTRAVENOUS

## 2018-02-19 MED ORDER — GLYCOPYRROLATE 0.2 MG/ML IJ SOLN
INTRAMUSCULAR | Status: DC | PRN
  Administered 2018-02-19: .1 mg via INTRAVENOUS
  Administered 2018-02-19: 0.2 mg via INTRAVENOUS

## 2018-02-19 MED ORDER — PROPOFOL 10 MG/ML IV BOLUS
INTRAVENOUS | Status: DC | PRN
  Administered 2018-02-19: 70 mg via INTRAVENOUS

## 2018-02-19 MED ORDER — MORPHINE SULFATE 2 MG/ML IV SOLN
INTRAVENOUS | Status: DC
  Administered 2018-02-19: 16:00:00 via INTRAVENOUS
  Administered 2018-02-20: 8 mg via INTRAVENOUS
  Administered 2018-02-20: 5 mg via INTRAVENOUS
  Administered 2018-02-21 (×2): 8 mg via INTRAVENOUS
  Administered 2018-02-21: 07:00:00 via INTRAVENOUS
  Filled 2018-02-19: qty 50
  Filled 2018-02-19: qty 30

## 2018-02-19 MED ORDER — FAMOTIDINE IN NACL 20-0.9 MG/50ML-% IV SOLN
20.0000 mg | INTRAVENOUS | Status: DC
  Administered 2018-02-19 – 2018-02-21 (×3): 20 mg via INTRAVENOUS
  Filled 2018-02-19 (×2): qty 50

## 2018-02-19 MED ORDER — ONDANSETRON HCL 4 MG/2ML IJ SOLN
INTRAMUSCULAR | Status: AC
  Filled 2018-02-19: qty 2

## 2018-02-19 MED ORDER — ARTIFICIAL TEARS OPHTHALMIC OINT
TOPICAL_OINTMENT | OPHTHALMIC | Status: DC | PRN
  Administered 2018-02-19: 1 via OPHTHALMIC

## 2018-02-19 MED ORDER — CEFAZOLIN SODIUM-DEXTROSE 2-4 GM/100ML-% IV SOLN
2.0000 g | INTRAVENOUS | Status: AC
  Administered 2018-02-19 (×2): 2 g via INTRAVENOUS

## 2018-02-19 MED ORDER — METOPROLOL TARTRATE 5 MG/5ML IV SOLN
5.0000 mg | Freq: Four times a day (QID) | INTRAVENOUS | Status: AC
  Administered 2018-02-19 – 2018-02-20 (×3): 5 mg via INTRAVENOUS
  Filled 2018-02-19 (×3): qty 5

## 2018-02-19 MED ORDER — ROCURONIUM BROMIDE 10 MG/ML (PF) SYRINGE
PREFILLED_SYRINGE | INTRAVENOUS | Status: AC
  Filled 2018-02-19: qty 10

## 2018-02-19 MED ORDER — MIDAZOLAM HCL 5 MG/5ML IJ SOLN
INTRAMUSCULAR | Status: DC | PRN
  Administered 2018-02-19 (×2): 1 mg via INTRAVENOUS

## 2018-02-19 MED ORDER — FENTANYL CITRATE (PF) 250 MCG/5ML IJ SOLN
INTRAMUSCULAR | Status: AC
  Filled 2018-02-19: qty 5

## 2018-02-19 MED ORDER — ENOXAPARIN SODIUM 40 MG/0.4ML ~~LOC~~ SOLN: 40.0000 mg | SUBCUTANEOUS | Status: DC

## 2018-02-19 MED ORDER — LIDOCAINE 2% (20 MG/ML) 5 ML SYRINGE
INTRAMUSCULAR | Status: AC
  Filled 2018-02-19: qty 5

## 2018-02-19 MED ORDER — OXYCODONE HCL 5 MG/5ML PO SOLN: 5.0000 mg | Freq: Once | ORAL | Status: DC | PRN

## 2018-02-19 MED ORDER — WATER FOR IRRIGATION, STERILE IR SOLN
Status: DC | PRN
  Administered 2018-02-19: 1000 mL

## 2018-02-19 MED ORDER — LIDOCAINE HCL 1 % IJ SOLN
INTRAMUSCULAR | Status: DC | PRN
  Administered 2018-02-19: 3 mL

## 2018-02-19 MED ORDER — ACETAMINOPHEN 10 MG/ML IV SOLN
1000.0000 mg | Freq: Four times a day (QID) | INTRAVENOUS | Status: AC
  Administered 2018-02-19 – 2018-02-20 (×4): 1000 mg via INTRAVENOUS
  Filled 2018-02-19 (×4): qty 100

## 2018-02-19 MED ORDER — 0.9 % SODIUM CHLORIDE (POUR BTL) OPTIME
TOPICAL | Status: DC | PRN
  Administered 2018-02-19 (×3): 1000 mL

## 2018-02-19 MED ORDER — PHENYLEPHRINE 40 MCG/ML (10ML) SYRINGE FOR IV PUSH (FOR BLOOD PRESSURE SUPPORT)
PREFILLED_SYRINGE | INTRAVENOUS | Status: DC | PRN
  Administered 2018-02-19 (×5): 80 ug via INTRAVENOUS
  Administered 2018-02-19 (×2): 40 ug via INTRAVENOUS
  Administered 2018-02-19 (×2): 80 ug via INTRAVENOUS
  Administered 2018-02-19: 120 ug via INTRAVENOUS

## 2018-02-19 MED ORDER — ROPIVACAINE HCL 2 MG/ML IJ SOLN
2.0000 mL/h | INTRAMUSCULAR | Status: DC
  Filled 2018-02-19: qty 200

## 2018-02-19 MED ORDER — HYDRALAZINE HCL 20 MG/ML IJ SOLN
20.0000 mg | INTRAMUSCULAR | Status: AC | PRN
  Administered 2018-02-20 – 2018-02-22 (×4): 20 mg via INTRAVENOUS
  Filled 2018-02-19 (×4): qty 1

## 2018-02-19 MED ORDER — SODIUM CHLORIDE 0.9 % IV SOLN
3.0000 g | Freq: Four times a day (QID) | INTRAVENOUS | Status: AC
  Administered 2018-02-19: 3 g via INTRAVENOUS
  Filled 2018-02-19: qty 3

## 2018-02-19 MED ORDER — METHYLENE BLUE 1 % INJ SOLN
1.0000 mL | Freq: Once | INTRAMUSCULAR | Status: DC
  Filled 2018-02-19: qty 10

## 2018-02-19 MED ORDER — ACETAMINOPHEN 500 MG PO TABS
1000.0000 mg | ORAL_TABLET | ORAL | Status: AC
  Administered 2018-02-19: 1000 mg via ORAL

## 2018-02-19 MED ORDER — DEXTROSE-NACL 5-0.9 % IV SOLN
INTRAVENOUS | Status: DC
  Administered 2018-02-19 – 2018-02-27 (×6): via INTRAVENOUS

## 2018-02-19 MED ORDER — SODIUM CHLORIDE 0.9 % IV SOLN
2.0000 mL/h | Status: DC
  Administered 2018-02-19: 10 mL/h via EPIDURAL

## 2018-02-19 MED ORDER — EPHEDRINE SULFATE 50 MG/ML IJ SOLN
INTRAMUSCULAR | Status: AC
Start: 2018-02-19 — End: ?
  Filled 2018-02-19: qty 1

## 2018-02-19 MED ORDER — INSULIN ASPART 100 UNIT/ML ~~LOC~~ SOLN
0.0000 [IU] | SUBCUTANEOUS | Status: DC
  Administered 2018-02-19 – 2018-02-20 (×2): 5 [IU] via SUBCUTANEOUS
  Administered 2018-02-20: 1 [IU] via SUBCUTANEOUS
  Administered 2018-02-20: 3 [IU] via SUBCUTANEOUS
  Administered 2018-02-20: 2 [IU] via SUBCUTANEOUS
  Administered 2018-02-20: 1 [IU] via SUBCUTANEOUS
  Administered 2018-02-20: 5 [IU] via SUBCUTANEOUS
  Administered 2018-02-21 – 2018-02-22 (×7): 2 [IU] via SUBCUTANEOUS
  Administered 2018-02-22: 3 [IU] via SUBCUTANEOUS
  Administered 2018-02-22: 2 [IU] via SUBCUTANEOUS
  Administered 2018-02-22: 1 [IU] via SUBCUTANEOUS
  Administered 2018-02-22: 2 [IU] via SUBCUTANEOUS
  Administered 2018-02-23 (×3): 1 [IU] via SUBCUTANEOUS
  Administered 2018-02-23: 2 [IU] via SUBCUTANEOUS
  Administered 2018-02-24 (×2): 1 [IU] via SUBCUTANEOUS
  Administered 2018-02-25 (×2): 2 [IU] via SUBCUTANEOUS
  Administered 2018-02-26 (×3): 1 [IU] via SUBCUTANEOUS
  Administered 2018-02-27: 2 [IU] via SUBCUTANEOUS
  Administered 2018-02-27: 1 [IU] via SUBCUTANEOUS

## 2018-02-19 MED ORDER — ONDANSETRON HCL 4 MG/2ML IJ SOLN
INTRAMUSCULAR | Status: DC | PRN
  Administered 2018-02-19 (×2): 4 mg via INTRAVENOUS

## 2018-02-19 MED ORDER — ACETAMINOPHEN 325 MG PO TABS: 325.0000 mg | ORAL_TABLET | ORAL | Status: DC | PRN

## 2018-02-19 SURGICAL SUPPLY — 160 items
ATTRACTOMAT 16X20 MAGNETIC DRP (DRAPES) IMPLANT
BAG BILE T-TUBES STRL (MISCELLANEOUS) ×3 IMPLANT
BIOPATCH RED 1 DISK 7.0 (GAUZE/BANDAGES/DRESSINGS) ×6 IMPLANT
BLADE CLIPPER SURG (BLADE) ×3 IMPLANT
BLADE EXTENDED COATED 6.5IN (ELECTRODE) IMPLANT
BLADE HEX COATED 2.75 (ELECTRODE) IMPLANT
BOOT SUTURE AID YELLOW STND (SUTURE) ×6 IMPLANT
CANISTER SUCT 3000ML PPV (MISCELLANEOUS) ×3 IMPLANT
CATH FOLEY 2WAY SLVR  5CC 16FR (CATHETERS)
CATH FOLEY 2WAY SLVR 5CC 16FR (CATHETERS) IMPLANT
CATH ROBINSON RED A/P 16FR (CATHETERS) IMPLANT
CHLORAPREP W/TINT 26ML (MISCELLANEOUS) ×3 IMPLANT
CLIP VESOCCLUDE LG 6/CT (CLIP) ×3 IMPLANT
CLIP VESOCCLUDE MED 24/CT (CLIP) ×3 IMPLANT
CLIP VESOLOCK LG 6/CT PURPLE (CLIP) ×18 IMPLANT
CLIP VESOLOCK MED 6/CT (CLIP) ×3 IMPLANT
CLIP VESOLOCK MED LG 6/CT (CLIP) ×3 IMPLANT
CLIP VESOLOCK XL 6/CT (CLIP) ×3 IMPLANT
CONT SPEC 4OZ CLIKSEAL STRL BL (MISCELLANEOUS) ×3 IMPLANT
COVER MAYO STAND STRL (DRAPES) IMPLANT
COVER SURGICAL LIGHT HANDLE (MISCELLANEOUS) ×3 IMPLANT
DECANTER SPIKE VIAL GLASS SM (MISCELLANEOUS) ×6 IMPLANT
DERMABOND ADVANCED (GAUZE/BANDAGES/DRESSINGS)
DERMABOND ADVANCED .7 DNX12 (GAUZE/BANDAGES/DRESSINGS) IMPLANT
DISSECTOR ROUND CHERRY 3/8 STR (MISCELLANEOUS) IMPLANT
DRAIN CHANNEL 15F RND FF 3/16 (WOUND CARE) IMPLANT
DRAIN CHANNEL 19F RND (DRAIN) ×6 IMPLANT
DRAIN PENROSE 1/2X36 STERILE (WOUND CARE) IMPLANT
DRAIN PENROSE 18X1/2 LTX STRL (DRAIN) IMPLANT
DRAPE INCISE IOBAN 66X45 STRL (DRAPES) IMPLANT
DRAPE LAPAROSCOPIC ABDOMINAL (DRAPES) IMPLANT
DRAPE LAPAROTOMY TRNSV 102X78 (DRAPE) IMPLANT
DRAPE TABLE BACK 44X90 PK DISP (DRAPES) ×3 IMPLANT
DRAPE WARM FLUID 44X44 (DRAPE) ×3 IMPLANT
DRSG COVADERM 4X10 (GAUZE/BANDAGES/DRESSINGS) IMPLANT
DRSG COVADERM 4X14 (GAUZE/BANDAGES/DRESSINGS) IMPLANT
DRSG COVADERM 4X6 (GAUZE/BANDAGES/DRESSINGS) IMPLANT
DRSG COVADERM 4X8 (GAUZE/BANDAGES/DRESSINGS) IMPLANT
DRSG TEGADERM 2-3/8X2-3/4 SM (GAUZE/BANDAGES/DRESSINGS) ×3 IMPLANT
DRSG TEGADERM 4X4.75 (GAUZE/BANDAGES/DRESSINGS) ×3 IMPLANT
DRSG TELFA 3X8 NADH (GAUZE/BANDAGES/DRESSINGS) ×3 IMPLANT
ELECT BLADE 4.0 EZ CLEAN MEGAD (MISCELLANEOUS) ×3
ELECT BLADE 6.5 EXT (BLADE) ×3 IMPLANT
ELECT CAUTERY BLADE 6.4 (BLADE) ×3 IMPLANT
ELECT REM PT RETURN 9FT ADLT (ELECTROSURGICAL) ×6
ELECTRODE BLDE 4.0 EZ CLN MEGD (MISCELLANEOUS) ×2 IMPLANT
ELECTRODE REM PT RTRN 9FT ADLT (ELECTROSURGICAL) ×4 IMPLANT
EVACUATOR SILICONE 100CC (DRAIN) ×6 IMPLANT
FLOSEAL 5ML (HEMOSTASIS) ×3 IMPLANT
GAUZE SPONGE 2X2 8PLY STRL LF (GAUZE/BANDAGES/DRESSINGS) ×2 IMPLANT
GAUZE SPONGE 4X4 12PLY STRL (GAUZE/BANDAGES/DRESSINGS) IMPLANT
GAUZE SPONGE 4X4 16PLY XRAY LF (GAUZE/BANDAGES/DRESSINGS) IMPLANT
GEL ULTRASOUND 20GR AQUASONIC (MISCELLANEOUS) ×3 IMPLANT
GLOVE BIO SURGEON STRL SZ 6 (GLOVE) ×9 IMPLANT
GLOVE BIO SURGEON STRL SZ7 (GLOVE) ×24 IMPLANT
GLOVE BIO SURGEON STRL SZ7.5 (GLOVE) ×3 IMPLANT
GLOVE BIO SURGEON STRL SZ8 (GLOVE) ×3 IMPLANT
GLOVE BIOGEL M STRL SZ7.5 (GLOVE) ×3 IMPLANT
GLOVE BIOGEL PI IND STRL 6.5 (GLOVE) ×2 IMPLANT
GLOVE BIOGEL PI IND STRL 7.0 (GLOVE) ×6 IMPLANT
GLOVE BIOGEL PI IND STRL 7.5 (GLOVE) ×4 IMPLANT
GLOVE BIOGEL PI IND STRL 8 (GLOVE) ×2 IMPLANT
GLOVE BIOGEL PI INDICATOR 6.5 (GLOVE) ×1
GLOVE BIOGEL PI INDICATOR 7.0 (GLOVE) ×3
GLOVE BIOGEL PI INDICATOR 7.5 (GLOVE) ×2
GLOVE BIOGEL PI INDICATOR 8 (GLOVE) ×1
GLOVE ECLIPSE 8.0 STRL XLNG CF (GLOVE) ×6 IMPLANT
GLOVE INDICATOR 6.5 STRL GRN (GLOVE) ×9 IMPLANT
GLOVE INDICATOR 8.0 STRL GRN (GLOVE) ×6 IMPLANT
GLOVE SURG SS PI 6.0 STRL IVOR (GLOVE) ×3 IMPLANT
GOWN STRL REUS W/ TWL LRG LVL3 (GOWN DISPOSABLE) ×10 IMPLANT
GOWN STRL REUS W/ TWL XL LVL3 (GOWN DISPOSABLE) ×4 IMPLANT
GOWN STRL REUS W/TWL 2XL LVL3 (GOWN DISPOSABLE) ×12 IMPLANT
GOWN STRL REUS W/TWL LRG LVL3 (GOWN DISPOSABLE) ×5
GOWN STRL REUS W/TWL XL LVL3 (GOWN DISPOSABLE) ×2
HEMOSTAT SURGICEL 2X14 (HEMOSTASIS) IMPLANT
HEMOSTAT SURGICEL 4X8 (HEMOSTASIS) IMPLANT
KIT BASIN OR (CUSTOM PROCEDURE TRAY) ×3 IMPLANT
KIT MARKER MARGIN INK (KITS) ×3 IMPLANT
KIT TOURNIQUET VASCULAR (KITS) IMPLANT
KIT TURNOVER KIT B (KITS) ×3 IMPLANT
L-HOOK LAP DISP 36CM (ELECTROSURGICAL) ×3
LHOOK LAP DISP 36CM (ELECTROSURGICAL) ×2 IMPLANT
LOOP VESSEL MAXI BLUE (MISCELLANEOUS) ×6 IMPLANT
LOOP VESSEL MINI RED (MISCELLANEOUS) ×3 IMPLANT
NEEDLE BIOPSY 14X6 SOFT TISS (NEEDLE) IMPLANT
NS IRRIG 1000ML POUR BTL (IV SOLUTION) ×6 IMPLANT
PACK GENERAL/GYN (CUSTOM PROCEDURE TRAY) ×3 IMPLANT
PAD ARMBOARD 7.5X6 YLW CONV (MISCELLANEOUS) ×3 IMPLANT
PAD SHARPS MAGNETIC DISPOSAL (MISCELLANEOUS) IMPLANT
PENCIL BUTTON HOLSTER BLD 10FT (ELECTRODE) IMPLANT
PLUG CATH AND CAP STER (CATHETERS) IMPLANT
POSITIONER SURGICAL ARM (MISCELLANEOUS) IMPLANT
RELOAD PROXIMATE 75MM BLUE (ENDOMECHANICALS) ×12 IMPLANT
RELOAD PROXIMATE 75MM GREEN (ENDOMECHANICALS) IMPLANT
SCISSORS LAP 5X35 DISP (ENDOMECHANICALS) IMPLANT
SEPRAFILM PROCEDURAL PACK 3X5 (MISCELLANEOUS) IMPLANT
SET IRRIG TUBING LAPAROSCOPIC (IRRIGATION / IRRIGATOR) IMPLANT
SHEARS FOC LG CVD HARMONIC 17C (MISCELLANEOUS) ×3 IMPLANT
SLEEVE ENDOPATH XCEL 5M (ENDOMECHANICALS) ×6 IMPLANT
SPONGE GAUZE 2X2 STER 10/PKG (GAUZE/BANDAGES/DRESSINGS) ×1
SPONGE INTESTINAL PEANUT (DISPOSABLE) IMPLANT
SPONGE LAP 18X18 X RAY DECT (DISPOSABLE) ×21 IMPLANT
SPONGE SURGIFOAM ABS GEL 100 (HEMOSTASIS) IMPLANT
STAPLER PROXIMATE 75MM BLUE (STAPLE) ×3 IMPLANT
STAPLER VISISTAT 35W (STAPLE) ×3 IMPLANT
SUCTION POOLE TIP (SUCTIONS) ×3 IMPLANT
SURGIFLO W/THROMBIN 8M KIT (HEMOSTASIS) IMPLANT
SUT 5.0 PDS RB-1 (SUTURE) ×5
SUT ETHILON 2 0 FS 18 (SUTURE) ×6 IMPLANT
SUT ETHILON 2 LR (SUTURE) IMPLANT
SUT ETHILON 3 0 PS 1 (SUTURE) IMPLANT
SUT MNCRL AB 4-0 PS2 18 (SUTURE) IMPLANT
SUT MON AB 2-0 SH 27 (SUTURE)
SUT MON AB 2-0 SH27 (SUTURE) IMPLANT
SUT PDS AB 1 TP1 96 (SUTURE) ×6 IMPLANT
SUT PDS AB 3-0 SH 27 (SUTURE) ×12 IMPLANT
SUT PDS AB 4-0 RB1 27 (SUTURE) ×36 IMPLANT
SUT PDS PLUS AB 5-0 RB-1 (SUTURE) ×10 IMPLANT
SUT PROLENE 3 0 SH 48 (SUTURE) ×12 IMPLANT
SUT PROLENE 4 0 RB 1 (SUTURE) ×2
SUT PROLENE 4-0 RB1 .5 CRCL 36 (SUTURE) ×4 IMPLANT
SUT PROLENE 5 0 RB 1 DA (SUTURE) ×6 IMPLANT
SUT SILK 0 (SUTURE)
SUT SILK 0 30XBRD TIE 6 (SUTURE) IMPLANT
SUT SILK 1 SH (SUTURE) ×3 IMPLANT
SUT SILK 2 0 (SUTURE)
SUT SILK 2 0 SH CR/8 (SUTURE) ×6 IMPLANT
SUT SILK 2 0 TIES 10X30 (SUTURE) ×3 IMPLANT
SUT SILK 2-0 30XBRD TIE 12 (SUTURE) IMPLANT
SUT SILK 3 0 SH CR/8 (SUTURE) ×3 IMPLANT
SUT SILK 3 0 TIES 10X30 (SUTURE) ×3 IMPLANT
SUT VIC AB 2-0 CT1 27 (SUTURE)
SUT VIC AB 2-0 CT1 TAPERPNT 27 (SUTURE) IMPLANT
SUT VIC AB 2-0 SH 18 (SUTURE) ×3 IMPLANT
SUT VIC AB 2-0 SH 27 (SUTURE) ×4
SUT VIC AB 2-0 SH 27X BRD (SUTURE) ×8 IMPLANT
SUT VIC AB 3-0 SH 18 (SUTURE) ×3 IMPLANT
SUT VIC AB 3-0 SH 27 (SUTURE) ×1
SUT VIC AB 3-0 SH 27X BRD (SUTURE) ×2 IMPLANT
SUT VIC AB 4-0 RB1 27 (SUTURE) ×4
SUT VIC AB 4-0 RB1 27XBRD (SUTURE) ×8 IMPLANT
SUT VICRYL AB 2 0 TIES (SUTURE) IMPLANT
TAPE UMBILICAL 1/8 X36 TWILL (MISCELLANEOUS) IMPLANT
TAPE UMBILICAL COTTON 1/8X30 (MISCELLANEOUS) ×3 IMPLANT
TOWEL OR 17X24 6PK STRL BLUE (TOWEL DISPOSABLE) IMPLANT
TOWEL OR 17X26 10 PK STRL BLUE (TOWEL DISPOSABLE) ×3 IMPLANT
TOWEL OR NON WOVEN STRL DISP B (DISPOSABLE) IMPLANT
TRAY FOLEY MTR SLVR 14FR STAT (SET/KITS/TRAYS/PACK) ×3 IMPLANT
TRAY LAPAROSCOPIC MC (CUSTOM PROCEDURE TRAY) ×3 IMPLANT
TROCAR XCEL BLUNT TIP 100MML (ENDOMECHANICALS) IMPLANT
TROCAR XCEL NON-BLD 11X100MML (ENDOMECHANICALS) IMPLANT
TROCAR XCEL NON-BLD 5MMX100MML (ENDOMECHANICALS) ×3 IMPLANT
TUBE FEEDING 8FR 16IN STR KANG (MISCELLANEOUS) ×3 IMPLANT
TUBE FEEDING ENTERAL 5FR 16IN (TUBING) ×3 IMPLANT
TUBING CONNECTING 10 (TUBING) IMPLANT
TUBING INSUFFLATION (TUBING) ×3 IMPLANT
TUNNELER SHEATH ON-Q 16GX12 DP (PAIN MANAGEMENT) IMPLANT
URINEMETER 200ML W/220 (MISCELLANEOUS) IMPLANT
WATER STERILE IRR 1500ML POUR (IV SOLUTION) ×3 IMPLANT

## 2018-02-19 NOTE — Progress Notes (Signed)
Paged on call trauma MD in regards to pt being slightly hypotensive according to cuff pressure. (2200: 86/47(60); 2300: 96/38(56)). Pt remained asymptomatic. MD asked RN to assess pt HR, and urine output which were both WNL. MD informed RN to continue to assess, and will not treat until pt is consistently below 90 SBP. Will continue to monitor. Fortino Sic, RN, BSN 02/19/2018 11:24 PM

## 2018-02-19 NOTE — Anesthesia Procedure Notes (Signed)
Procedure Name: Intubation Date/Time: 02/19/2018 8:08 AM Performed by: Candis Shine, CRNA Pre-anesthesia Checklist: Patient identified, Emergency Drugs available, Suction available and Patient being monitored Patient Re-evaluated:Patient Re-evaluated prior to induction Oxygen Delivery Method: Circle System Utilized Preoxygenation: Pre-oxygenation with 100% oxygen Induction Type: IV induction Ventilation: Mask ventilation without difficulty Laryngoscope Size: Mac and 4 Grade View: Grade I Tube type: Oral Tube size: 7.5 mm Number of attempts: 1 Airway Equipment and Method: Stylet Placement Confirmation: ETT inserted through vocal cords under direct vision,  positive ETCO2 and breath sounds checked- equal and bilateral Secured at: 22 cm Tube secured with: Tape Dental Injury: Teeth and Oropharynx as per pre-operative assessment

## 2018-02-19 NOTE — Op Note (Signed)
Preoperative diagnosis: Left renal mass  Postop diagnosis: Same  Procedure: 1.  Open left radical nephrectomy  Attending: Nicolette Bang, MD  Resident: Fredrik Rigger, MD  Anesthesia: General  Estimated blood loss: 100 cc  Drains: 16 French Foley catheter  Specimens: Left radical nephrectomy and andrenal  Antibiotics: ancef  Findings: left posterior renal mass. 1 renal artery and 1 vein  Indications: Patient is a 72 year old with a history of 6 cm left renal mass.  The mass was not amenable to partial nephrectomy.  After discussing treatment options patient decided to proceed with open radical nephrectomy.  Procedure in detail: Prior to procedure consent was obtained. Patient was brought to the operating room and briefing was done sure correct patient, correct procedure, correct site.  General anesthesia was in administered patient was placed in the supine position.  a 75 French catheter was placed. their abdomen and flank was then prepped and draped usual sterile fashion.  Dr. Barry Dienes made a midline incision and performed pancreatectomy. Once the pencreatic mass was removed we the proceeded to perform the nephrectomy. We then dissected along the white line of [place].  We then reflected the colon medially.  We then identified the psoas muscle.  Once this was done we traced it down to the iliac vessels and identified the ureter.  Once we identified the gonadal vein and ureter were then traced this to the renal hilum.  The renal vein and renal artery were skeletonized.  We did we identified one renal vein one renal artery. We placed 2 clips on the renal artery and then ligated the artery with a 0 silk.  Using the Ethicon stapler within ligated the renal vein.   Once this was done we then freed the kidney from its lateral and posterior attachments.  We then ligated the ureter and gonadal vein and then the sepcimen was removed. We inspected the retroperitoneum and noted no residual. We then turned  the case back over to Dr. Barry Dienes to finish her procedure.    Complications: None  Condition: Stable  Plan: Patient is to be admitted for inpatient stay.

## 2018-02-19 NOTE — Op Note (Signed)
PREOPERATIVE DIAGNOSIS: adenocarcinoma of head of pancreas, s/p neoadjuvant FOLFIRINOX   POSTOPERATIVE DIAGNOSIS: Same.   PROCEDURES PERFORMED:  Diagnostic laparoscopy  Classic pancreaticoduodenectomy with portal vein resection  Placement of biliary and pancreatic duct stents  SURGEON: [name], MD   ASSISTANT: Neysa Bonito, MD   ANESTHESIA: General and epidural   FINDINGS: 2 cm pancreatic head mass. Firm pancreatic tissue. 8 mm common bile duct. 6 mm pancreatic duct  SPECIMENS:  1.  Right liver nodule 2. Pancreaticoduodenectomy with gallbladder:   ESTIMATED BLOOD LOSS: 200 mL.   COMPLICATIONS: None known.   PROCEDURE:   Pt was identified in the holding area and taken to  the operating room, and placed supine on the operating room  table. General anesthesia was induced. The patient's abdomen was  prepped and draped in a sterile fashion, after a Foley catheter was  placed. A time-out was performed according to the surgical safety check  list. When all was correct we continued.   The patient was placed in reverse trendelenburg position and rotated to the right.  The left subcostal margin was anesthetized with local anesthesia.  A 5 mm optiview trocar was placed under direct visualization.  The abdomen was insufflated with carbon dioxide.  The abdomen was examined.  A second port was placed in the upper midline to be able to better visualize the right liver.  A small nodule on the right anterior liver was seen.  This was biopsied and sent for frozen.    While waiting on the frozen section, the pelvis was examined.  The omentum was adherent to the right groin at the site of prior hernia repair.  A third 5 mm port was placed in the right lateral abdomen.  The omentum was taken down with cautery and retracted toward the head.  At this point, the frozen returned as negative.    A midline incision was made from the xiphoid to just above the umbilicus. The subcutaneous tissues were  divided with the Bovie cautery. The peritoneum was entered in the center of the abdomen. Digital retraction was then used to elevate the preperitoneal fat, and  this was taken with the cautery as well. The subcutaneous tissues and  fascia of the muscular layers were taken laterally with the cautery.  Care was taken to protect the underlying viscera.   The Bookwalter self-retaining retractor was placed to assist for visualization. The right colon was taken down off of the white line  of Toldt and from the retroperitoneum at the hepatic flexure. The porta was identified. The  duodenum was kocherized extensively with blunt dissection and with cautery. The gallbladder was taken off the liver with a combination of blunt dissection and cautery. The cystic duct was clipped with the Hemalock clips. The cystic duct was divided and the gallbladder was passed off.   The common bile duct was skeletonized near the duodenum. A vessel loop was passed around it. The gastroduodenal artery, as well as the common hepatic artery were skeletonized. The proper hepatic artery was traced out to make sure that flow was going to both sides of the liver when the GDA was clamped. The GDA was test clamped with the bulldog, with good flow to the liver and  no signs of ischemia. This was divided with 2-0 silk ties and then clipped. The proper hepatic artery was reflected upward, and the anterior portal vein was exposed.   The kelly was passed partially under the portal vein, but there was a portion that  was adherent.  Also, the right angle was used to dissect away part of the pancreas from the portal vein superiorly.  This also got to a point where there was adherence.    Attention was then directed to the stomach, and the omentum was taken  off of the stomach at the border of the antrum and the body. The  gastrohepatic ligament was taken down with the harmonic, and care was  taken to make sure there was not a replaced left  hepatic artery in this  location. The stomach was divided with the GIA-75 stapler. The border  of the stomach was oversewn with a 3-0 running PDS suture.   Attention was then directed to the small bowel. Around 10 cm past the  ligament of Treitz it was located, and this was divided with the 75-GIA.  The distal portion of the jejunum was also oversewn with a 3-0 PDS  suture. The fourth portion of the duodenum was skeletonized with the  harmonic scalpel, taking down all of the mesenteric vessels. The  ligament of Treitz was taken down. The IMV was preserved.  The duodenum was then passed underneath the portal vein.   At this point, 2-0 silk sutures were tied down and the inferior and superior border of the pancreas. The Bovie was used to coagulate the small bleeders at the border of the pancreas.  The bovie was used to take the pancreas off the anterior portal vein.   The Overholt in combination with the harmonic and locking Weck clips  were then used to take the uncinate process off of the portal vein and  the superior mesenteric artery. There was a portion of portal vein that was adherent anterolaterally.  Once all the surrounding tissue was taken, the Satinsky clamp was place laterally.  A 3-0 prolene was placed superiorly and inferiorly.  The #10 blade was used to take the lateral portion of the portal vein off.  The prolene was run the length of the defect.  The satinsky was removed.  There was a small bleeder in the middle of the suture line and this was oversewn.  No additional bleeding was seen.  Care was taken not to incorporate the superior mesenteric artery in the dissection. The specimen was then marked and passed off the table for frozen section margin.    The patient was left with Dr. Alyson Ingles for left radical nephrectomy.  The frozens returned back as all negative.  Once the nephrectomy was done, we resumed the pancreatic operation.      The jejunum was then passed underneath the  SMV in order to get appropriate lie for the pancreatic and biliary anastomoses. The more distal portion of the jejunum was pulled up over  the colon, and two 3-0 silks were placed through the posterior border  of the stomach for the gastrojejunostomy. The stomach and the small  bowel were opened, and a GIA-75 was used to create an end-to-end  anastomosis. The open areas of the staple line were examined to ensure  that there was hemostasis. The defect was then closed with a single  layer of running Connell suture of 3-0 PDS. Prior to a complete  closure, the NG tube was passed toward the afferent limb.   The appropriate location for the choledochojejunostomy was identified, and  the small bowel was opened approximately 8 mm. The anastamosis was created with approximately nine 4-0 interrupted PDS sutures.   The 2 corner sutures were placed first  and then  the posterior layer was done in an interrupted fashion tying on  the inside. An 8 fr pediatric feeding tube was used as a biliary stent.  The superior layer was then closed with interrupted sutures as  well.   The pancreatic anastomosis was then created.  The pancreas was firm, and the duct was 6-7 mm. The posterior layer was formed first with 2-0 silk sutures in interrupted fashion.  The jejunum was opened 6 mm.  The back layer of the duct-to-mucosa anastamosis was made with 5-0 PDS.  A 5 Fr pediatric feeding tube was used as a pancreatic stent. The anterior layer was then oversewn with 2-0 silks to dunk the pancreatic parenchyma.   The areas were then irrigated and then those anastomoses were covered  with Evicel. This was allowed to dry. The abdomen was then irrigated  again and all the laparotomy sponges were removed. A lap count was  performed, which was correct. Two 19-Blake drains were placed, with the  lateral-most drain placed behind the choledochojejunostomy. The medial  Blake drain was placed just anterior and slightly superior to the   pancreaticojejunostomy. The fascia was then closed with #1 looped running PDS sutures. The skin was irrigated and then closed with  staples. The wounds were cleaned, dried and dressed with a sterile  dressing.   The patient tolerated the procedure well and was extubated and taken to  PACU in stable condition. Needle and sponge counts were correct x2.

## 2018-02-19 NOTE — Anesthesia Preprocedure Evaluation (Addendum)
Anesthesia Evaluation  Patient identified by MRN, date of birth, ID band Patient awake    Reviewed: Allergy & Precautions, NPO status , Patient's Chart, lab work & pertinent test results, reviewed documented beta blocker date and time   History of Anesthesia Complications Negative for: history of anesthetic complications  Airway Mallampati: II  TM Distance: >3 FB Neck ROM: Full    Dental  (+) Edentulous Upper, Edentulous Lower   Pulmonary neg shortness of breath, neg sleep apnea, neg COPD, neg recent URI, former smoker,    breath sounds clear to auscultation       Cardiovascular hypertension, Pt. on medications and Pt. 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 (all labs ordered are listed,  but only abnormal results are displayed)  Labs Reviewed CBC WITH DIFFERENTIAL/PLATELET - Abnormal; Notable for the following components:     Result Value   RBC 3.63 (*)   Hemoglobin 11.3 (*)   HCT 35.8 (*)   Platelets 134 (*)   All other components within normal limits COMPREHENSIVE METABOLIC PANEL - Abnormal; Notable for the following components:  Glucose, Bld 100 (*)   BUN 22 (*)   ALT 11 (*)   All other components within normal limits HEMOGLOBIN A1C - Abnormal; Notable for the following components:  Hgb A1c MFr Bld 6.4 (*)   All other components within normal limits GLUCOSE, CAPILLARY PROTIME-INR PREPARE RBC (CROSSMATCH) TYPE AND SCREEN    IMAGES:  CT chest 12/25/17:  1. There has been mild decrease in size of head of pancreas mass compared with 09/28/2017. 2. Mass involving the upper pole of left kidney is not significantly changed in the interval compatible with renal cell carcinoma. 3. Right lower lobe pulmonary nodule is stable compared with 07/20/2017. 4. Aortic Atherosclerosis and Emphysema. 5. Ectatic abdominal aorta measures 2.9 cm. Previously 2.6 cm. Ectatic abdominal aorta at risk for aneurysm development. Recommend followup by ultrasound in 5 years.  6. Three vessel coronary artery atherosclerotic calcifications  1 view CXR 09/15/17: Left Port-A-Cath tip in the SVC. No pneumothorax   EKG 08/31/17: Sinus rhythm with first-degree AV block.  Possible LA enlargement.  Cannot rule out anterior infarct, age undetermined.  T wave abnormality, consider lateral ischemia.   CV:  Echo 09/13/17:  - Left ventricle: The cavity size was normal. There was mild concentric hypertrophy. Systolic function was mildly reduced. The estimated ejection fraction was in the range of 45% to 50%. Wall motion was normal; there were no regional wall motionabnormalities. Doppler parameters are consistent with abnormalleft ventricular relaxation (grade 1  diastolic dysfunction).Doppler parameters are consistent with high ventricular filling pressure. - Aortic valve: A 37m Edwards Sapien TAVR bioprosthesis was present. There was mild stenosis. There was mild perivalvularregurgitation. Peak velocity (S): 296 cm/s. Mean gradient (S): 122mHg. - Mitral valve: Transvalvular velocity was within the normal range.There was no evidence for stenosis. There was no regurgitation. - Right ventricle: The cavity size was normal. Wall thickness wasnormal. Systolic function was normal. - Atrial septum: No defect or patent foramen ovale was identifiedby color flow Doppler.  Cardiac cath 07/05/17:  1. Multivessel coronary artery disease with continued patency of the stented segments in the LAD, first diagonal branch, and mid RCA 2. Mild diffuse nonobstructive residual CAD (Ramus 50%; mid RCA 20%) 3. Normal right heart hemodynamics 4. Severe aortic stenosis with a peak to peak gradient of 42 mmHg and a mean gradient of 33 mmHg    Past Medical History: Diagnosis Date . Arthritis  . CAD (coronary artery disease)   [place], DES to diagonal, DES x 2 to proximal and mid RCA  . Essential hypertension  . Former smoker  . GERD (gastroesophageal reflux disease)  . Heart murmur  . Hyperlipidemia  . [place] . [place] . Pneumonia  . Renal mass  . S/P TAVR (transcatheter aortic valve replacement)   a. 08/2017:  Edwards Sapien 3 THV (size 26 mm, model #9600CM26A , serial # [id number]. Severe aortic stenosis   a. 08/2017: s/p TAVR by Dr. McAngelena Formnd Dr. BaCyndia Bent . [facility]" . Type 2 diabetes mellitus ([place]   Past Surgical History: Procedure Laterality Date . APPENDECTOMY   . CARDIAC CATHETERIZATION   . EUS N/A 08/02/2017  Procedure: UPPER ENDOSCOPIC ULTRASOUND (EUS) RADIAL;  Surgeon: JaMilus BanisterMD;  [facility];  Service: Endoscopy;  Laterality: N/A; . EYE SURGERY Left  . HAND SURGERY Bilateral  . HERNIA REPAIR Right  . PORTACATH PLACEMENT N/A 09/15/2017  Procedure: INSERTION PORT-A-CATH;  Surgeon: ByStark KleinMD;  [place] Service: General;  Laterality: N/A; . RIGHT/LEFT HEART CATH AND CORONARY ANGIOGRAPHY N/A 07/05/2017  Procedure: RIGHT/LEFT HEART CATH AND CORONARY ANGIOGRAPHY;  Surgeon: CoSherren MochaMD;  Location: [facility];  Service: Cardiovascular;  Laterality: N/A; . SHOULDER ARTHROSCOPY WITH ROTATOR CUFF REPAIR Left  . TEE WITHOUT CARDIOVERSION N/A 08/22/2017  Procedure: TRANSESOPHAGEAL ECHOCARDIOGRAM (TEE);  Surgeon: McBurnell BlanksMD;  [place] Service: Open Heart Surgery;  Laterality: N/A; . TRANSCATHETER AORTIC VALVE REPLACEMENT, TRANSFEMORAL N/A 08/22/2017  Procedure: TRANSCATHETER AORTIC VALVE REPLACEMENT, TRANSFEMORAL;  Surgeon: McBurnell BlanksMD;  [place] Service: Open Heart Surgery;  Laterality: N/A;   MEDICATIONS: . aspirin EC 81 MG tablet . atorvastatin (LIPITOR) 20 MG tablet . baclofen (LIORESAL) 10 MG tablet . carvedilol (COREG) 3.125 MG tablet . clopidogrel (PLAVIX) 75 MG tablet . diphenoxylate-atropine (LOMOTIL) 2.5-0.025 MG tablet . Insulin Glargine (BASAGLAR KWIKPEN) 100 UNIT/ML SOPN . insulin lispro (HUMALOG) 100 UNIT/ML injection . Insulin Pen Needle (CAREFINE PEN  NEEDLES) 32G X 4 MM MISC . lidocaine-prilocaine (EMLA) cream . lisinopril (PRINIVIL,ZESTRIL) 10 MG tablet . ondansetron (ZOFRAN) 8 MG tablet . prochlorperazine (COMPAZINE) 5 MG tablet  No current facility-administered medications for this encounter.   - ASA and plavix to be stopped 7 days prior to surgery      Reproductive/Obstetrics                            Anesthesia Physical Anesthesia Plan  ASA: III  Anesthesia Plan: General and Epidural   Post-op Pain  Management:    Induction: Intravenous  PONV Risk Score and Plan: 2 and Ondansetron and Dexamethasone  Airway Management Planned: Oral ETT  Additional Equipment: Arterial line  Intra-op Plan:   Post-operative Plan: Extubation in OR  Informed Consent: I have reviewed the patients History and Physical, chart, labs and discussed the procedure including the risks, benefits and alternatives for the proposed anesthesia with the patient or authorized representative who has indicated his/her understanding and acceptance.   Dental advisory given  Plan Discussed with: CRNA and Surgeon  Anesthesia Plan Comments:         Anesthesia Quick Evaluation

## 2018-02-19 NOTE — H&P (Signed)
Manuel Olson is an 72 y.o. male.   Chief Complaint: pancreatic cancer HPI:  Pt is a 72 yo M dx with pancreatic cancer incidentally while undergoign workup for aortic valve surgery.  He had TAVR, then neoadjuvant treatment.  He also had a left renal cell cancer dx.  He presents for surgery.    Past Medical History:  Diagnosis Date  . Arthritis   . CAD (coronary artery disease)    a. 11/2016 in [place]: STEMI s/p DES to mid LAD, DES to diagonal, DES x 2 to proximal and mid RCA   . Essential hypertension   . Former smoker   . GERD (gastroesophageal reflux disease)   . Heart murmur   . Hyperlipidemia   . Myocardial infarction ([place])   . Pancreatic cancer ([place])   . Pneumonia   . Renal mass   . S/P TAVR (transcatheter aortic valve replacement)    a. 08/2017:  Edwards Sapien 3 THV (size 26 mm, model #9600CM26A , serial # [id number])  . Severe aortic stenosis    a. 08/2017: s/p TAVR by Dr. Angelena Form and Dr. Cyndia Bent.   . 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 Type 2 diabetes mellitus ([place])     Past Surgical History:  Procedure Laterality Date  . APPENDECTOMY    . CARDIAC CATHETERIZATION    . EUS N/A 08/02/2017   Procedure: UPPER ENDOSCOPIC ULTRASOUND (EUS) RADIAL;  Surgeon: [name], MD;  [facility];  Service: Endoscopy;  Laterality: N/A;  . EYE SURGERY Left   . HAND SURGERY Bilateral   . HERNIA REPAIR Right   . PORTACATH PLACEMENT N/A 09/15/2017   Procedure: INSERTION PORT-A-CATH;  Surgeon: [name], MD;  Location: [place];  Service: General;  Laterality: N/A;  . RIGHT/LEFT HEART CATH AND CORONARY ANGIOGRAPHY N/A 07/05/2017   Procedure: RIGHT/LEFT HEART CATH AND CORONARY ANGIOGRAPHY;  Surgeon: [name], MD;  Location: [employer] CV LAB;  Service: Cardiovascular;  Laterality: N/A;  . SHOULDER ARTHROSCOPY WITH ROTATOR CUFF REPAIR Left   . TEE WITHOUT CARDIOVERSION N/A 08/22/2017   Procedure: TRANSESOPHAGEAL ECHOCARDIOGRAM (TEE);  Surgeon:  Burnell Blanks, MD;  Location: [place];  Service: Open Heart Surgery;  Laterality: N/A;  . TRANSCATHETER AORTIC VALVE REPLACEMENT, TRANSFEMORAL N/A 08/22/2017   Procedure: TRANSCATHETER AORTIC VALVE REPLACEMENT, TRANSFEMORAL;  Surgeon: Burnell Blanks, MD;  Location: [place];  Service: Open Heart Surgery;  Laterality: N/A;    Family History  Problem Relation Age of Onset  . Lupus Mother   . CAD Brother   . Hypertension Brother    Social History:  reports that he quit smoking about 22 months ago. His smoking use included cigarettes. He started smoking about 59 years ago. He has a 57.00 pack-year smoking history. He has never used smokeless tobacco. He reports that he does not drink alcohol or use drugs.  Allergies: No Known Allergies  Medications Prior to Admission  Medication Sig Dispense Refill  . aspirin EC 81 MG tablet Take 1 tablet (81 mg total) by mouth daily. (Patient taking differently: Take 81 mg by mouth daily at 2 PM. ) 90 tablet 3  . atorvastatin (LIPITOR) 20 MG tablet Take 1 tablet (20 mg total) by mouth daily. 90 tablet 1  . carvedilol (COREG) 3.125 MG tablet TAKE (1) TABLET BY MOUTH TWICE A DAY WITH MEALS (BREAKFAST AND SUPPER) 180 tablet 2  . clopidogrel (PLAVIX) 75 MG tablet Take 1 tablet (75 mg total) by  mouth daily. (Patient taking differently: Take 75 mg by mouth daily with breakfast. ) 90 tablet 1  . Insulin Glargine (BASAGLAR KWIKPEN) 100 UNIT/ML SOPN Inject 0.2 mLs (20 Units total) into the skin at bedtime. 5 pen 5  . insulin lispro (HUMALOG) 100 UNIT/ML injection Inject 0.04-0.06 mLs (4-6 Units total) into the skin 3 (three) times daily before meals. Pens. (Patient taking differently: Inject 6 Units into the skin 2 (two) times daily before a meal. Breakfast & supper) 15 mL 11  . lidocaine-prilocaine (EMLA) cream Apply 1 application topically as needed. Apply small amount of cream over port site 1-2 hrs prior to chemo, cover with plastic wrap. (Patient taking  differently: Apply 1 application topically as needed (for recieving chemo in port site). Apply small amount of cream over port site 1-2 hrs prior to chemo, cover with plastic wrap.) 30 g 0  . lisinopril (PRINIVIL,ZESTRIL) 10 MG tablet Take 1 tablet (10 mg total) by mouth daily. 90 tablet 2  . baclofen (LIORESAL) 10 MG tablet Take 0.5 tablets (5 mg total) by mouth 3 (three) times daily as needed for muscle spasms. 30 each 0  . diphenoxylate-atropine (LOMOTIL) 2.5-0.025 MG tablet Take 2 tablets by mouth 4 (four) times daily as needed for diarrhea or loose stools. 60 tablet 0  . Insulin Pen Needle (CAREFINE PEN NEEDLES) 32G X 4 MM MISC Use 1x a day 100 each 3  . ondansetron (ZOFRAN) 8 MG tablet Take 1 tablet (8 mg total) by mouth every 8 (eight) hours as needed for nausea or vomiting. 20 tablet 0  . prochlorperazine (COMPAZINE) 5 MG tablet Take 1 tablet (5 mg total) by mouth every 6 (six) hours as needed for nausea or vomiting. 30 tablet 0    Results for orders placed or performed during the hospital encounter of 02/19/18 (from the past 48 hour(s))  Glucose, capillary     Status: Abnormal   Collection Time: 02/19/18  5:59 AM  Result Value Ref Range   Glucose-Capillary 146 (H) 65 - 99 mg/dL   No results found.  Review of Systems  All other systems reviewed and are negative.   Blood pressure (!) 160/71, pulse 64, temperature 97.6 F (36.4 C), temperature source Oral, resp. rate 20, SpO2 98 %. Physical Exam  Constitutional: He is oriented to person, place, and time. He appears well-developed. No distress.  HENT:  Head: Normocephalic and atraumatic.  Eyes: Pupils are equal, round, and reactive to light. Conjunctivae are normal. No scleral icterus.  Neck: Normal range of motion. Neck supple.  Cardiovascular: Normal rate, regular rhythm and intact distal pulses.  Respiratory: Effort normal. No respiratory distress.  GI: Soft. He exhibits no distension. There is no tenderness.  Musculoskeletal:  Normal range of motion.  Neurological: He is alert and oriented to person, place, and time.  Skin: Skin is warm and dry. He is not diaphoretic.  Psychiatric: He has a normal mood and affect. His behavior is normal. Judgment and thought content normal.     Assessment/Plan Adenocarcinoma of the pancreatic head.   DM Anemia of chronic dx and s/p chemotherapy  Plan dx laparoscopy and whipple Questions answered.    [name], MD 02/19/2018, 7:35 AM

## 2018-02-19 NOTE — Anesthesia Procedure Notes (Signed)
Arterial Line Insertion Start/End6/06/2018 7:00 AM Performed by: Candis Shine, CRNA, CRNA  Patient location: Pre-op. Preanesthetic checklist: patient identified, IV checked, site marked, risks and benefits discussed, surgical consent, monitors and equipment checked, pre-op evaluation, timeout performed and anesthesia consent Lidocaine 1% used for infiltration Left, radial was placed Catheter size: 20 G Hand hygiene performed  and maximum sterile barriers used   Attempts: 2 Procedure performed without using ultrasound guided technique. Following insertion, dressing applied. Post procedure assessment: normal and unchanged  Patient tolerated the procedure well with no immediate complications.

## 2018-02-19 NOTE — Anesthesia Procedure Notes (Signed)
Epidural Patient location during procedure: pre-op Start time: 02/19/2018 7:18 AM End time: 02/19/2018 7:28 AM  Staffing Anesthesiologist: Oleta Mouse, MD Performed: anesthesiologist   Preanesthetic Checklist Completed: patient identified, surgical consent, pre-op evaluation, timeout performed, IV checked, risks and benefits discussed and monitors and equipment checked  Epidural Patient position: sitting Prep: ChloraPrep Patient monitoring: heart rate, cardiac monitor, continuous pulse ox and blood pressure Approach: midline Location: thoracic (1-12) Injection technique: LOR saline  Needle:  Needle type: Tuohy  Needle gauge: 17 G Needle length: 9 cm Needle insertion depth: 7 cm Catheter type: closed end flexible Catheter size: 19 Gauge Catheter at skin depth: 15 cm Test dose: negative and 1.5% lidocaine with Epi 1:200 K  Assessment Events: blood not aspirated, injection not painful, no injection resistance, negative IV test and no paresthesia  Additional Notes Reason for block:at surgeon's request and procedure for pain

## 2018-02-19 NOTE — Transfer of Care (Signed)
Immediate Anesthesia Transfer of Care Note  Patient: Manuel Olson  Procedure(s) Performed: DIAGNOSTIC LAPAROSCOPY, ERAS PATHWAY (N/A Abdomen) WHIPPLE PROCEDURE (N/A Abdomen) OPEN LEFT RADICAL NEPHRECTOMY (Left Abdomen)  Patient Location: PACU  Anesthesia Type:General and Epidural  Level of Consciousness: drowsy and patient cooperative  Airway & Oxygen Therapy: Patient Spontanous Breathing and Patient connected to nasal cannula oxygen  Post-op Assessment: Report given to RN and Post -op Vital signs reviewed and stable  Post vital signs: Reviewed and stable  Last Vitals:  Vitals Value Taken Time  BP 124/50 02/19/2018  2:44 PM  Temp    Pulse 71 02/19/2018  2:46 PM  Resp 17 02/19/2018  2:46 PM  SpO2 93 % 02/19/2018  2:46 PM  Vitals shown include unvalidated device data.  Last Pain:  Vitals:   02/19/18 0602  TempSrc: Oral         Complications: No apparent anesthesia complications

## 2018-02-20 ENCOUNTER — Encounter (HOSPITAL_COMMUNITY): Payer: Self-pay | Admitting: General Surgery

## 2018-02-20 LAB — CBC
HCT: 23.1 % — ABNORMAL LOW (ref 39.0–52.0)
Hemoglobin: 7.3 g/dL — ABNORMAL LOW (ref 13.0–17.0)
MCH: 30.9 pg (ref 26.0–34.0)
MCHC: 31.6 g/dL (ref 30.0–36.0)
MCV: 97.9 fL (ref 78.0–100.0)
Platelets: 85 10*3/uL — ABNORMAL LOW (ref 150–400)
RBC: 2.36 MIL/uL — ABNORMAL LOW (ref 4.22–5.81)
RDW: 14.6 % (ref 11.5–15.5)
WBC: 14.2 10*3/uL — ABNORMAL HIGH (ref 4.0–10.5)

## 2018-02-20 LAB — COMPREHENSIVE METABOLIC PANEL
ALT: 97 U/L — ABNORMAL HIGH (ref 17–63)
AST: 108 U/L — ABNORMAL HIGH (ref 15–41)
Albumin: 2.5 g/dL — ABNORMAL LOW (ref 3.5–5.0)
Alkaline Phosphatase: 44 U/L (ref 38–126)
Anion gap: 4 — ABNORMAL LOW (ref 5–15)
BUN: 27 mg/dL — ABNORMAL HIGH (ref 6–20)
CO2: 21 mmol/L — ABNORMAL LOW (ref 22–32)
Calcium: 7.4 mg/dL — ABNORMAL LOW (ref 8.9–10.3)
Chloride: 110 mmol/L (ref 101–111)
Creatinine, Ser: 1.81 mg/dL — ABNORMAL HIGH (ref 0.61–1.24)
GFR calc Af Amer: 41 mL/min — ABNORMAL LOW (ref >60)
GFR calc non Af Amer: 35 mL/min — ABNORMAL LOW (ref >60)
Glucose, Bld: 250 mg/dL — ABNORMAL HIGH (ref 65–99)
Potassium: 4.8 mmol/L (ref 3.5–5.1)
Sodium: 135 mmol/L (ref 135–145)
Total Bilirubin: 0.6 mg/dL (ref 0.3–1.2)
Total Protein: 4.2 g/dL — ABNORMAL LOW (ref 6.5–8.1)

## 2018-02-20 LAB — GLUCOSE, CAPILLARY
Glucose-Capillary: 144 mg/dL — ABNORMAL HIGH (ref 65–99)
Glucose-Capillary: 149 mg/dL — ABNORMAL HIGH (ref 65–99)
Glucose-Capillary: 157 mg/dL — ABNORMAL HIGH (ref 65–99)
Glucose-Capillary: 192 mg/dL — ABNORMAL HIGH (ref 65–99)
Glucose-Capillary: 219 mg/dL — ABNORMAL HIGH (ref 65–99)
Glucose-Capillary: 256 mg/dL — ABNORMAL HIGH (ref 65–99)
Glucose-Capillary: 266 mg/dL — ABNORMAL HIGH (ref 65–99)

## 2018-02-20 LAB — MAGNESIUM: Magnesium: 1.4 mg/dL — ABNORMAL LOW (ref 1.7–2.4)

## 2018-02-20 LAB — PHOSPHORUS: Phosphorus: 3.2 mg/dL (ref 2.5–4.6)

## 2018-02-20 LAB — PREPARE RBC (CROSSMATCH)

## 2018-02-20 MED ORDER — ALBUMIN HUMAN 25 % IV SOLN
25.0000 g | Freq: Four times a day (QID) | INTRAVENOUS | Status: AC
  Administered 2018-02-20 – 2018-02-22 (×8): 25 g via INTRAVENOUS
  Filled 2018-02-20 (×2): qty 50
  Filled 2018-02-20 (×2): qty 100
  Filled 2018-02-20: qty 50
  Filled 2018-02-20 (×3): qty 100
  Filled 2018-02-20: qty 50
  Filled 2018-02-20: qty 100

## 2018-02-20 MED ORDER — ENOXAPARIN SODIUM 40 MG/0.4ML ~~LOC~~ SOLN: 40.0000 mg | SUBCUTANEOUS | Status: DC

## 2018-02-20 MED ORDER — SODIUM CHLORIDE 0.9 % IV SOLN
Freq: Once | INTRAVENOUS | Status: AC
  Administered 2018-02-20: 12:00:00 via INTRAVENOUS

## 2018-02-20 MED ORDER — SODIUM CHLORIDE 0.9 % IV BOLUS
1000.0000 mL | Freq: Once | INTRAVENOUS | Status: AC
  Administered 2018-02-20: 1000 mL via INTRAVENOUS

## 2018-02-20 MED ORDER — ALBUMIN HUMAN 5 % IV SOLN
25.0000 g | Freq: Once | INTRAVENOUS | Status: AC
  Administered 2018-02-20: 25 g via INTRAVENOUS
  Filled 2018-02-20: qty 250

## 2018-02-20 MED ORDER — MAGNESIUM SULFATE 4 GM/100ML IV SOLN
4.0000 g | Freq: Once | INTRAVENOUS | Status: AC
Start: 2018-02-20 — End: 2018-02-20
  Administered 2018-02-20: 4 g via INTRAVENOUS
  Filled 2018-02-20: qty 100

## 2018-02-20 MED ORDER — INSULIN GLARGINE 100 UNIT/ML ~~LOC~~ SOLN
18.0000 [IU] | Freq: Every day | SUBCUTANEOUS | Status: DC
  Administered 2018-02-20 – 2018-02-21 (×2): 18 [IU] via SUBCUTANEOUS
  Filled 2018-02-20 (×3): qty 0.18

## 2018-02-20 MED ORDER — ALBUMIN HUMAN 25 % IV SOLN: 25.0000 g | Freq: Four times a day (QID) | INTRAVENOUS | Status: DC

## 2018-02-20 NOTE — Progress Notes (Signed)
1 Day Post-Op   Subjective/Chief Complaint: Soreness and dry mouth.     Objective: Vital signs in last 24 hours: Temp:  [97.2 F (36.2 C)-98.8 F (37.1 C)] 97.7 F (36.5 C) (06/11 1324) Pulse Rate:  [65-81] 69 (06/11 1339) Resp:  [10-27] 11 (06/11 1339) BP: (76-170)/(38-94) 158/59 (06/11 1324) SpO2:  [92 %-100 %] 100 % (06/11 1339) Arterial Line BP: (66-206)/(36-71) 189/63 (06/11 1339) Weight:  [73.9 kg (162 lb 14.7 oz)] 73.9 kg (162 lb 14.7 oz) (06/10 1639) Last BM Date: (pta)  Intake/Output from previous day: 06/10 0701 - 06/11 0700 In: 5688.7 [I.V.:4512.5; IV Piggyback:950] Out: 1390 [Urine:910; Emesis/NG output:20; Drains:110; Blood:350] Intake/Output this shift: Total I/O In: 805 [I.V.:125; Blood:630; Other:50] Out: 163 [Urine:445]  General appearance: alert, cooperative and mild distress Resp: breathing comfortably Cardio: regular rate and rhythm GI: soft, non distended, drains serosan. Extremities: extremities normal, atraumatic, no cyanosis or edema  Lab Results:  Recent Labs    02/19/18 1512 02/20/18 0425  WBC 14.2* 14.2*  HGB 8.1* 7.3*  HCT 25.7* 23.1*  PLT 99* 85*   BMET Recent Labs    02/19/18 1054  02/19/18 1349 02/19/18 1512 02/20/18 0425  NA 135   < > 134*  --  135  K 5.0   < > 5.4*  --  4.8  CL  --   --   --   --  110  CO2  --   --   --   --  21*  GLUCOSE 157*  --   --   --  250*  BUN  --   --   --   --  27*  CREATININE  --   --   --  1.50* 1.81*  CALCIUM  --   --   --   --  7.4*   < > = values in this interval not displayed.   PT/INR No results for input(s): LABPROT, INR in the last 72 hours. ABG Recent Labs    02/19/18 1249 02/19/18 1349  PHART 7.247* 7.281*  HCO3 25.0 24.1    Studies/Results: No results found.  Anti-infectives: Anti-infectives (From admission, onward)   Start     Dose/Rate Route Frequency Ordered Stop   02/19/18 1800  Ampicillin-Sulbactam (UNASYN) 3 g in sodium chloride 0.9 % 100 mL IVPB     3 g 200  mL/hr over 30 Minutes Intravenous Every 6 hours 02/19/18 1634 02/19/18 1754   02/19/18 0630  ceFAZolin (ANCEF) IVPB 2g/100 mL premix     2 g 200 mL/hr over 30 Minutes Intravenous On call to O.R. 02/19/18 8453 02/19/18 1215   02/19/18 6468  ceFAZolin (ANCEF) 2-4 GM/100ML-% IVPB    Note to Pharmacy:  [name]   : cabinet override      02/19/18 0628 02/19/18 0815      Assessment/Plan: s/p Procedure(s): DIAGNOSTIC LAPAROSCOPY, ERAS PATHWAY (N/A) WHIPPLE PROCEDURE (N/A) OPEN LEFT RADICAL NEPHRECTOMY (Left) Continue foley due to urinary monitoring, epidural in place leave in ICU due to some labile BP  ABL anemia and anemia of chronic dx - transfuse Single kidney at this point - avoid hypotension. Aortic stenosis resolved. Hypertension - IV metoprolol.  Hold on ace inhibitor DM- lantus and SSI.   LOS: 1 day    [name] 02/20/2018

## 2018-02-20 NOTE — Progress Notes (Addendum)
Urology Progress Note   1 Day Post-Op  Subjective: Slightly hypotensive overnight ( SBP - 90s)  UOP adequate - 900cc Cr 1.8 ( baseline 0.9)   Objective: Vital signs in last 24 hours: Temp:  [97.2 F (36.2 C)-98.8 F (37.1 C)] 98.2 F (36.8 C) (06/11 0800) Pulse Rate:  [66-81] 66 (06/11 0700) Resp:  [10-27] 19 (06/11 0830) BP: (76-161)/(38-94) 143/94 (06/11 0700) SpO2:  [92 %-100 %] 100 % (06/11 0830) Arterial Line BP: (66-200)/(36-71) 188/68 (06/11 0700) Weight:  [73.9 kg (162 lb 14.7 oz)] 73.9 kg (162 lb 14.7 oz) (06/10 1639)  Intake/Output from previous day: 06/10 0701 - 06/11 0700 In: 5688.7 [I.V.:4512.5; IV Piggyback:950] Out: 1390 [Urine:910; Emesis/NG output:20; Drains:110; Blood:350] Intake/Output this shift: No intake/output data recorded.  Physical Exam:  General: Alert and oriented CV: RRR Lungs: Clear Abdomen: NGT in place Soft, appropriately tender. Right sided drains in place, island dressing dry GU: Foley in place draining clear yellow urine  Ext: NT, No erythema  Lab Results: Recent Labs    02/19/18 1349 02/19/18 1512 02/20/18 0425  HGB 8.5* 8.1* 7.3*  HCT 25.0* 25.7* 23.1*   BMET Recent Labs    02/19/18 1054  02/19/18 1349 02/19/18 1512 02/20/18 0425  NA 135   < > 134*  --  135  K 5.0   < > 5.4*  --  4.8  CL  --   --   --   --  110  CO2  --   --   --   --  21*  GLUCOSE 157*  --   --   --  250*  BUN  --   --   --   --  27*  CREATININE  --   --   --  1.50* 1.81*  CALCIUM  --   --   --   --  7.4*   < > = values in this interval not displayed.     Studies/Results: No results found.  Assessment/Plan:  72 y.o. male s/p Whipple and left radical nx.  Overall doing well post-op.   AKI - Slight elevation in Cr is expected ( secondary to Nx and exacerbated by period of hypotension). Recommend continued fluid resuscitation and daily Cr. Foley cathete per primary team.   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 Pathology pending - will discuss  with patient when available   Dispo: per primary team    LOS: 1 day   Alla Feeling, MD 02/20/2018, 8:40 AM

## 2018-02-20 NOTE — Progress Notes (Signed)
POD# 1 Awake and easily arousable. VSS c BP in 170s  Epidural rate 10cc/ hr. This seems to be controlling pain relatively well. P/ Continue and assess in am

## 2018-02-21 ENCOUNTER — Encounter (HOSPITAL_COMMUNITY): Payer: Self-pay | Admitting: General Surgery

## 2018-02-21 LAB — CBC
HCT: 27 % — ABNORMAL LOW (ref 39.0–52.0)
Hemoglobin: 8.8 g/dL — ABNORMAL LOW (ref 13.0–17.0)
MCH: 31.2 pg (ref 26.0–34.0)
MCHC: 32.6 g/dL (ref 30.0–36.0)
MCV: 95.7 fL (ref 78.0–100.0)
Platelets: 61 10*3/uL — ABNORMAL LOW (ref 150–400)
RBC: 2.82 MIL/uL — ABNORMAL LOW (ref 4.22–5.81)
RDW: 15.8 % — ABNORMAL HIGH (ref 11.5–15.5)
WBC: 15.3 10*3/uL — ABNORMAL HIGH (ref 4.0–10.5)

## 2018-02-21 LAB — COMPREHENSIVE METABOLIC PANEL
ALT: 76 U/L — ABNORMAL HIGH (ref 17–63)
AST: 61 U/L — ABNORMAL HIGH (ref 15–41)
Albumin: 2.9 g/dL — ABNORMAL LOW (ref 3.5–5.0)
Alkaline Phosphatase: 42 U/L (ref 38–126)
Anion gap: 3 — ABNORMAL LOW (ref 5–15)
BUN: 21 mg/dL — ABNORMAL HIGH (ref 6–20)
CO2: 23 mmol/L (ref 22–32)
Calcium: 8 mg/dL — ABNORMAL LOW (ref 8.9–10.3)
Chloride: 109 mmol/L (ref 101–111)
Creatinine, Ser: 1.54 mg/dL — ABNORMAL HIGH (ref 0.61–1.24)
GFR calc Af Amer: 50 mL/min — ABNORMAL LOW (ref >60)
GFR calc non Af Amer: 43 mL/min — ABNORMAL LOW (ref >60)
Glucose, Bld: 199 mg/dL — ABNORMAL HIGH (ref 65–99)
Potassium: 4.8 mmol/L (ref 3.5–5.1)
Sodium: 135 mmol/L (ref 135–145)
Total Bilirubin: 0.7 mg/dL (ref 0.3–1.2)
Total Protein: 4.7 g/dL — ABNORMAL LOW (ref 6.5–8.1)

## 2018-02-21 LAB — GLUCOSE, CAPILLARY
Glucose-Capillary: 166 mg/dL — ABNORMAL HIGH (ref 65–99)
Glucose-Capillary: 173 mg/dL — ABNORMAL HIGH (ref 65–99)
Glucose-Capillary: 158 mg/dL — ABNORMAL HIGH (ref 65–99)
Glucose-Capillary: 153 mg/dL — ABNORMAL HIGH (ref 65–99)
Glucose-Capillary: 160 mg/dL — ABNORMAL HIGH (ref 65–99)

## 2018-02-21 MED ORDER — BACLOFEN 10 MG PO TABS
5.0000 mg | ORAL_TABLET | Freq: Three times a day (TID) | ORAL | Status: DC | PRN
  Administered 2018-02-22: 5 mg via ORAL
  Filled 2018-02-21: qty 1

## 2018-02-21 MED ORDER — HYDROMORPHONE HCL 1 MG/ML IJ SOLN
0.5000 mg | INTRAMUSCULAR | Status: DC | PRN
  Administered 2018-02-21 – 2018-02-22 (×4): 1 mg via INTRAVENOUS
  Administered 2018-02-22 (×2): 0.5 mg via INTRAVENOUS
  Administered 2018-02-23: 1 mg via INTRAVENOUS
  Administered 2018-02-23: 0.5 mg via INTRAVENOUS
  Administered 2018-02-23: 1 mg via INTRAVENOUS
  Filled 2018-02-21 (×10): qty 1

## 2018-02-21 MED ORDER — ATORVASTATIN CALCIUM 20 MG PO TABS
20.0000 mg | ORAL_TABLET | Freq: Every day | ORAL | Status: DC
  Administered 2018-02-22 – 2018-02-27 (×6): 20 mg via ORAL
  Filled 2018-02-21 (×7): qty 1

## 2018-02-21 MED ORDER — CARVEDILOL 3.125 MG PO TABS
3.1250 mg | ORAL_TABLET | Freq: Two times a day (BID) | ORAL | Status: DC
  Administered 2018-02-21 – 2018-02-27 (×13): 3.125 mg via ORAL
  Filled 2018-02-21 (×14): qty 1

## 2018-02-21 NOTE — Addendum Note (Signed)
Addendum  created 02/21/18 1300 by Roberts Gaudy, MD   Sign clinical note

## 2018-02-21 NOTE — Progress Notes (Signed)
Received patient from 4N20 alert and cooperative.  Epidural has come apart and Anesthesia notified. Dr Ermalene Postin stated to turn medication off and he would be up to take care of Epidural.

## 2018-02-21 NOTE — Progress Notes (Signed)
Anesthesiology Note:  Epidural catheter occluded. Unable to infuse through pump or manually flush. Connector changed X 2. Catheter withdrawn 0.5 cm without success. Catheter d/ced, site ok tip intact. Unable to flush saline through catheter after cath out. Bandage applied to insertion site.  Roberts Gaudy

## 2018-02-21 NOTE — Progress Notes (Signed)
2 Days Post-Op   Subjective/Chief Complaint: Feeling better.     Objective: Vital signs in last 24 hours: Temp:  [97.7 F (36.5 C)-99.3 F (37.4 C)] 99.3 F (37.4 C) (06/12 1100) Pulse Rate:  [62-102] 98 (06/12 1100) Resp:  [8-20] 12 (06/12 1100) BP: (128-186)/(59-102) 177/77 (06/12 1100) SpO2:  [89 %-100 %] 98 % (06/12 1100) Arterial Line BP: (143-217)/(57-73) 201/69 (06/12 1000) Last BM Date: (PTA)  Intake/Output from previous day: 06/11 0701 - 06/12 0700 In: 4066.5 [I.V.:1919.2; Blood:1362.3; IV Piggyback:565] Out: 2045 [Urine:1745; Emesis/NG output:200; Drains:100] Intake/Output this shift: Total I/O In: 525 [I.V.:375; Other:50; IV Piggyback:100] Out: 275 [Urine:275]  General appearance: alert, cooperative and mild distress Resp: breathing comfortably Cardio: regular rate and rhythm GI: soft, non distended, drains serosan. Extremities: extremities normal, atraumatic, no cyanosis or edema  Lab Results:  Recent Labs    02/20/18 0425 02/21/18 0500  WBC 14.2* 15.3*  HGB 7.3* 8.8*  HCT 23.1* 27.0*  PLT 85* 61*   BMET Recent Labs    02/20/18 0425 02/21/18 0500  NA 135 135  K 4.8 4.8  CL 110 109  CO2 21* 23  GLUCOSE 250* 199*  BUN 27* 21*  CREATININE 1.81* 1.54*  CALCIUM 7.4* 8.0*   PT/INR No results for input(s): LABPROT, INR in the last 72 hours. ABG Recent Labs    02/19/18 1249 02/19/18 1349  PHART 7.247* 7.281*  HCO3 25.0 24.1    Studies/Results: No results found.  Anti-infectives: Anti-infectives (From admission, onward)   Start     Dose/Rate Route Frequency Ordered Stop   02/19/18 1800  Ampicillin-Sulbactam (UNASYN) 3 g in sodium chloride 0.9 % 100 mL IVPB     3 g 200 mL/hr over 30 Minutes Intravenous Every 6 hours 02/19/18 1634 02/19/18 1754   02/19/18 0630  ceFAZolin (ANCEF) IVPB 2g/100 mL premix     2 g 200 mL/hr over 30 Minutes Intravenous On call to O.R. 02/19/18 4163 02/19/18 1215   02/19/18 0628  ceFAZolin (ANCEF) 2-4  GM/100ML-% IVPB    Note to Pharmacy:  [name]   : cabinet override      02/19/18 0628 02/19/18 0815      Assessment/Plan: s/p Procedure(s): DIAGNOSTIC LAPAROSCOPY, ERAS PATHWAY (N/A) WHIPPLE PROCEDURE (N/A) OPEN LEFT RADICAL NEPHRECTOMY (Left)   ABL anemia and anemia of chronic dx - transfused yesterday, approp bump.  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 Cr back down.   Aortic stenosis resolved s/p TAVR. Hypertension - convert back to PO coreg.  Hold on ace inhibitor DM- lantus and SSI.  Sugars better. NGT out today, start clears.    LOS: 2 days    [name] 02/21/2018

## 2018-02-21 NOTE — Anesthesia Postprocedure Evaluation (Signed)
Anesthesia Post Note  Patient: [name]  Procedure(s) Performed: DIAGNOSTIC LAPAROSCOPY, ERAS PATHWAY (N/A Abdomen) WHIPPLE PROCEDURE (N/A Abdomen) OPEN LEFT RADICAL NEPHRECTOMY (Left Abdomen)     Patient location during evaluation: PACU Anesthesia Type: General and Epidural Level of consciousness: awake and alert Pain management: pain level controlled Vital Signs Assessment: post-procedure vital signs reviewed and stable Respiratory status: spontaneous breathing, nonlabored ventilation, respiratory function stable and patient connected to nasal cannula oxygen Cardiovascular status: blood pressure returned to baseline and stable Postop Assessment: no apparent nausea or vomiting Anesthetic complications: no    Last Vitals:  Vitals:   02/21/18 0800 02/21/18 0900  BP: (!) 150/74 128/65  Pulse: 97 (!) 102  Resp: 16 11  Temp: 37.1 C   SpO2: 96% 96%    Last Pain:  Vitals:   02/21/18 0800  TempSrc: Oral  PainSc: 4                  Naquisha Whitehair

## 2018-02-22 ENCOUNTER — Encounter (HOSPITAL_COMMUNITY): Payer: Self-pay | Admitting: General Practice

## 2018-02-22 ENCOUNTER — Other Ambulatory Visit: Payer: Self-pay

## 2018-02-22 LAB — CBC
HCT: 24.5 % — ABNORMAL LOW (ref 39.0–52.0)
Hemoglobin: 8.1 g/dL — ABNORMAL LOW (ref 13.0–17.0)
MCH: 31.5 pg (ref 26.0–34.0)
MCHC: 33.1 g/dL (ref 30.0–36.0)
MCV: 95.3 fL (ref 78.0–100.0)
Platelets: 56 10*3/uL — ABNORMAL LOW (ref 150–400)
RBC: 2.57 MIL/uL — ABNORMAL LOW (ref 4.22–5.81)
RDW: 15.2 % (ref 11.5–15.5)
WBC: 12.8 10*3/uL — ABNORMAL HIGH (ref 4.0–10.5)

## 2018-02-22 LAB — COMPREHENSIVE METABOLIC PANEL
ALT: 74 U/L — ABNORMAL HIGH (ref 17–63)
AST: 47 U/L — ABNORMAL HIGH (ref 15–41)
Albumin: 3.4 g/dL — ABNORMAL LOW (ref 3.5–5.0)
Alkaline Phosphatase: 52 U/L (ref 38–126)
Anion gap: 7 (ref 5–15)
BUN: 13 mg/dL (ref 6–20)
CO2: 25 mmol/L (ref 22–32)
Calcium: 8.7 mg/dL — ABNORMAL LOW (ref 8.9–10.3)
Chloride: 102 mmol/L (ref 101–111)
Creatinine, Ser: 1.28 mg/dL — ABNORMAL HIGH (ref 0.61–1.24)
GFR calc Af Amer: >60 mL/min (ref >60)
GFR calc non Af Amer: 53 mL/min — ABNORMAL LOW (ref >60)
Glucose, Bld: 188 mg/dL — ABNORMAL HIGH (ref 65–99)
Potassium: 4.7 mmol/L (ref 3.5–5.1)
Sodium: 134 mmol/L — ABNORMAL LOW (ref 135–145)
Total Bilirubin: 1 mg/dL (ref 0.3–1.2)
Total Protein: 5.3 g/dL — ABNORMAL LOW (ref 6.5–8.1)

## 2018-02-22 LAB — GLUCOSE, CAPILLARY
Glucose-Capillary: 139 mg/dL — ABNORMAL HIGH (ref 65–99)
Glucose-Capillary: 186 mg/dL — ABNORMAL HIGH (ref 65–99)
Glucose-Capillary: 174 mg/dL — ABNORMAL HIGH (ref 65–99)
Glucose-Capillary: 161 mg/dL — ABNORMAL HIGH (ref 65–99)
Glucose-Capillary: 217 mg/dL — ABNORMAL HIGH (ref 65–99)
Glucose-Capillary: 170 mg/dL — ABNORMAL HIGH (ref 65–99)

## 2018-02-22 MED ORDER — LISINOPRIL 5 MG PO TABS
5.0000 mg | ORAL_TABLET | Freq: Every day | ORAL | Status: DC
  Administered 2018-02-22: 5 mg via ORAL
  Filled 2018-02-22: qty 1

## 2018-02-22 MED ORDER — PANTOPRAZOLE SODIUM 40 MG PO TBEC
40.0000 mg | DELAYED_RELEASE_TABLET | Freq: Every day | ORAL | Status: DC
  Administered 2018-02-22 – 2018-02-27 (×6): 40 mg via ORAL
  Filled 2018-02-22 (×6): qty 1

## 2018-02-22 MED ORDER — INSULIN GLARGINE 100 UNIT/ML ~~LOC~~ SOLN
22.0000 [IU] | Freq: Every day | SUBCUTANEOUS | Status: DC
  Administered 2018-02-22 – 2018-02-26 (×5): 22 [IU] via SUBCUTANEOUS
  Filled 2018-02-22 (×7): qty 0.22

## 2018-02-22 NOTE — Care Management Important Message (Signed)
Important Message  Patient Details  Name: Manuel Olson MRN: [id number] Date of Birth: Jan 11, 1946   Medicare Important Message Given:  Yes    Manuel Olson Montine Circle 02/22/2018, 2:51 PM

## 2018-02-22 NOTE — Evaluation (Signed)
Physical Therapy Evaluation Patient Details Name: Manuel Olson MRN: [id number] DOB: Nov 01, 1945 Today's Date: 02/22/2018   History of Present Illness  72 y.o. male admitted on 02/19/18 for nephrectomy due to L nephrectomy and diagnostic laparoscopy, classic pancreaticoduodenectomy with portal vein resection, and placement of biliar and pancreatic duct stents (whipple).  Pt with other significant PMH of recent TAVR procedure (08/2017), stroke, DM2, pancreatic CA, MI, essential HTN, CAD, L shoulder arthroscopy with RTC repair, bil hand surgery, L eye surgery, HOH bil ears with hearing aids.     Clinical Impression  Pt was able to get up OOB to the recliner chair and preform pre-gait in standing with RW.  He was too lightheaded to attempt gait away from the recliner chair today.  He was independent PTA and is hopeful to d/c home with his wife's supervision.   PT to follow acutely for deficits listed below.       Follow Up Recommendations Home health PT;Supervision for mobility/OOB    Equipment Recommendations  Rolling walker with 5" wheels;3in1 (PT)    Recommendations for Other Services   NA    Precautions / Restrictions Precautions Precautions: Fall      Mobility  Bed Mobility               General bed mobility comments: Pt was OOB in the recliner chair.   Transfers Overall transfer level: Needs assistance Equipment used: Rolling walker (2 wheeled) Transfers: Sit to/from Stand Sit to Stand: Min assist         General transfer comment: Heavy min assist to support trunk to power up to stand.  Pt needed cues to scoot closer to the edge of the recliner chair.  Verbal cues for safe hand placement.    Ambulation/Gait             General Gait Details: did not ambulate as pt was too lightheaded in standing and had to sit back down.           Balance Overall balance assessment: Needs assistance Sitting-balance support: Feet supported;No upper extremity  supported Sitting balance-Leahy Scale: Good     Standing balance support: Bilateral upper extremity supported Standing balance-Leahy Scale: Poor Standing balance comment: needs support of RW and from therapist today.  Stood and marched in place x 2 for ~20 seconds each.                               Pertinent Vitals/Pain Pain Assessment: Faces Faces Pain Scale: Hurts whole lot Pain Location: abdomen and chronic low back Pain Descriptors / Indicators: Aching;Grimacing;Guarding Pain Intervention(s): Limited activity within patient's tolerance;Monitored during session;Repositioned;RN gave pain meds during session    [place] expects to be discharged to:: Private residence Living Arrangements: Spouse/significant other Available Help at Discharge: Family;Available 24 hours/day Type of Home: House Home Access: Stairs to enter Entrance Stairs-Rails: None(something there to pull on, but not a formal rail. ) Entrance Stairs-Number of Steps: 3(small) Home Layout: One level Home Equipment: None      Prior Function Level of Independence: Independent         Comments: drives, retired        Extremity/Trunk Assessment   Upper Extremity Assessment Upper Extremity Assessment: Generalized weakness    Lower Extremity Assessment Lower Extremity Assessment: Generalized weakness    Cervical / Trunk Assessment Cervical / Trunk Assessment: Other exceptions Cervical / Trunk Exceptions: pt reports h/o chronic low back  pain.   Communication   Communication: HOH(bil hearing aids)  Cognition Arousal/Alertness: Awake/alert Behavior During Therapy: WFL for tasks assessed/performed Overall Cognitive Status: Impaired/Different from baseline Area of Impairment: Memory;Problem solving                     Memory: Decreased short-term memory;Decreased recall of precautions       Problem Solving: Slow processing;Difficulty sequencing;Requires verbal  cues;Requires tactile cues General Comments: Per RN she is suspicious that he gets a bit confused with the pain medication.       General Comments General comments (skin integrity, edema, etc.): Despite lightheadedness pt's VSS on RA in sitting.  BP mildly elevated with h/o essential HTN.  Likely the IV pain meds administered just before therapist started with pt.          Assessment/Plan    PT Assessment Patient needs continued PT services  PT Problem List Decreased strength;Decreased activity tolerance;Decreased balance;Decreased mobility;Decreased cognition;Decreased knowledge of use of DME;Decreased knowledge of precautions;Pain       PT Treatment Interventions DME instruction;Gait training;Stair training;Functional mobility training;Therapeutic activities;Therapeutic exercise;Balance training;Cognitive remediation;Patient/family education    PT Goals (Current goals can be found in the Care Plan section)  Acute Rehab PT Goals Patient Stated Goal: to decrease pain PT Goal Formulation: With patient Time For Goal Achievement: 03/08/18 Potential to Achieve Goals: Good    Frequency Min 3X/week           AM-PAC PT "6 Clicks" Daily Activity  Outcome Measure Difficulty turning over in bed (including adjusting bedclothes, sheets and blankets)?: A Little Difficulty moving from lying on back to sitting on the side of the bed? : A Little Difficulty sitting down on and standing up from a chair with arms (e.g., wheelchair, bedside commode, etc,.)?: A Little Help needed moving to and from a bed to chair (including a wheelchair)?: A Little Help needed walking in hospital room?: A Little Help needed climbing 3-5 steps with a railing? : A Lot 6 Click Score: 17    End of Session   Activity Tolerance: Other (comment)(limited by lightheadedness.  ) Patient left: in chair;with call bell/phone within reach;with chair alarm set;with family/visitor present   PT Visit Diagnosis: Muscle  weakness (generalized) (M62.81);Difficulty in walking, not elsewhere classified (R26.2);Pain Pain - Right/Left: (abdomen) Pain - part of body: (abdomen)    Time: 3664-4034 PT Time Calculation (min) (ACUTE ONLY): 24 min   Charges:       Wells Guiles B. Kenley Rettinger, PT, DPT [phone]   PT Evaluation $PT Eval Moderate Complexity: 1 Mod PT Treatments $Therapeutic Activity: 8-22 mins   02/22/2018, 3:37 PM

## 2018-02-22 NOTE — Progress Notes (Signed)
3 Days Post-Op   Subjective/Chief Complaint: Tolerated clears, but no flatus yet, and still some belching.  Pain adequately controlled.  No n/v.    Objective: Vital signs in last 24 hours: Temp:  [97.7 F (36.5 C)-98.4 F (36.9 C)] 98.1 F (36.7 C) (06/13 1635) Pulse Rate:  [88-106] 91 (06/13 1215) Resp:  [12-20] 12 (06/13 1215) BP: (146-197)/(67-89) 164/79 (06/13 1215) SpO2:  [95 %-99 %] 97 % (06/13 1215) Last BM Date: (PTA)  Intake/Output from previous day: 06/12 0701 - 06/13 0700 In: 211 [P.O.:120; I.V.:375; IV Piggyback:100] Out: 2275 [Urine:2275] Intake/Output this shift: Total I/O In: 240 [P.O.:240] Out: 1050 [Urine:1050]  General appearance: sleeping, no distress, aroused easily.  VERY hard of hearing.  Resp: breathing comfortably Cardio: regular rate and rhythm GI: soft, non distended, drains serosang. Extremities: extremities normal, atraumatic, no cyanosis or edema  Lab Results:  Recent Labs    02/21/18 0500 02/22/18 0342  WBC 15.3* 12.8*  HGB 8.8* 8.1*  HCT 27.0* 24.5*  PLT 61* 56*   BMET Recent Labs    02/21/18 0500 02/22/18 0342  NA 135 134*  K 4.8 4.7  CL 109 102  CO2 23 25  GLUCOSE 199* 188*  BUN 21* 13  CREATININE 1.54* 1.28*  CALCIUM 8.0* 8.7*   PT/INR No results for input(s): LABPROT, INR in the last 72 hours. ABG No results for input(s): PHART, HCO3 in the last 72 hours.  Invalid input(s): PCO2, PO2  Studies/Results: No results found.  Anti-infectives: Anti-infectives (From admission, onward)   Start     Dose/Rate Route Frequency Ordered Stop   02/19/18 1800  Ampicillin-Sulbactam (UNASYN) 3 g in sodium chloride 0.9 % 100 mL IVPB     3 g 200 mL/hr over 30 Minutes Intravenous Every 6 hours 02/19/18 1634 02/19/18 1754   02/19/18 0630  ceFAZolin (ANCEF) IVPB 2g/100 mL premix     2 g 200 mL/hr over 30 Minutes Intravenous On call to O.R. 02/19/18 1552 02/19/18 1215   02/19/18 0628  ceFAZolin (ANCEF) 2-4 GM/100ML-% IVPB    Note  to Pharmacy:  [name]   : cabinet override      02/19/18 0628 02/19/18 0815      Assessment/Plan: s/p Procedure(s): DIAGNOSTIC LAPAROSCOPY, ERAS PATHWAY (N/A) WHIPPLE PROCEDURE (N/A) OPEN LEFT RADICAL NEPHRECTOMY (Left)   ABL anemia and anemia of chronic dx - watch.  May give additional blood if he remains lightheaded. Thrombocytopenia - watch.  If goes down more, will call heme onc.    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 Cr back down.   Aortic stenosis resolved s/p TAVR. Hypertension - convert back to PO coreg.  Hold on ace inhibitor DM- lantus and SSI.  Sugars better. Full liquids for am meal    LOS: 3 days    [name] 02/22/2018

## 2018-02-23 LAB — GLUCOSE, CAPILLARY
Glucose-Capillary: 126 mg/dL — ABNORMAL HIGH (ref 65–99)
Glucose-Capillary: 134 mg/dL — ABNORMAL HIGH (ref 65–99)
Glucose-Capillary: 146 mg/dL — ABNORMAL HIGH (ref 65–99)
Glucose-Capillary: 170 mg/dL — ABNORMAL HIGH (ref 65–99)
Glucose-Capillary: 175 mg/dL — ABNORMAL HIGH (ref 65–99)
Glucose-Capillary: 184 mg/dL — ABNORMAL HIGH (ref 65–99)

## 2018-02-23 LAB — CBC
HCT: 22.9 % — ABNORMAL LOW (ref 39.0–52.0)
Hemoglobin: 7.5 g/dL — ABNORMAL LOW (ref 13.0–17.0)
MCH: 30.5 pg (ref 26.0–34.0)
MCHC: 32.8 g/dL (ref 30.0–36.0)
MCV: 93.1 fL (ref 78.0–100.0)
Platelets: 64 10*3/uL — ABNORMAL LOW (ref 150–400)
RBC: 2.46 MIL/uL — ABNORMAL LOW (ref 4.22–5.81)
RDW: 14.5 % (ref 11.5–15.5)
WBC: 9.4 10*3/uL (ref 4.0–10.5)

## 2018-02-23 LAB — COMPREHENSIVE METABOLIC PANEL
ALT: 44 U/L (ref 17–63)
AST: 21 U/L (ref 15–41)
Albumin: 3.7 g/dL (ref 3.5–5.0)
Alkaline Phosphatase: 46 U/L (ref 38–126)
Anion gap: 6 (ref 5–15)
BUN: 14 mg/dL (ref 6–20)
CO2: 24 mmol/L (ref 22–32)
Calcium: 8.7 mg/dL — ABNORMAL LOW (ref 8.9–10.3)
Chloride: 101 mmol/L (ref 101–111)
Creatinine, Ser: 1.21 mg/dL (ref 0.61–1.24)
GFR calc Af Amer: >60 mL/min (ref >60)
GFR calc non Af Amer: 57 mL/min — ABNORMAL LOW (ref >60)
Glucose, Bld: 136 mg/dL — ABNORMAL HIGH (ref 65–99)
Potassium: 3.6 mmol/L (ref 3.5–5.1)
Sodium: 131 mmol/L — ABNORMAL LOW (ref 135–145)
Total Bilirubin: 1.3 mg/dL — ABNORMAL HIGH (ref 0.3–1.2)
Total Protein: 5.5 g/dL — ABNORMAL LOW (ref 6.5–8.1)

## 2018-02-23 LAB — PREPARE RBC (CROSSMATCH)

## 2018-02-23 MED ORDER — LISINOPRIL 10 MG PO TABS
10.0000 mg | ORAL_TABLET | Freq: Every day | ORAL | Status: DC
  Administered 2018-02-23 – 2018-02-27 (×5): 10 mg via ORAL
  Filled 2018-02-23 (×5): qty 1

## 2018-02-23 MED ORDER — ACETAMINOPHEN 325 MG PO TABS: 650.0000 mg | ORAL_TABLET | Freq: Four times a day (QID) | ORAL | Status: DC | PRN

## 2018-02-23 MED ORDER — BACLOFEN 10 MG PO TABS
10.0000 mg | ORAL_TABLET | Freq: Three times a day (TID) | ORAL | Status: DC | PRN
  Administered 2018-02-23: 10 mg via ORAL
  Filled 2018-02-23: qty 1

## 2018-02-23 MED ORDER — SODIUM CHLORIDE 0.9 % IV SOLN
Freq: Once | INTRAVENOUS | Status: AC
  Administered 2018-02-23: 11:00:00 via INTRAVENOUS

## 2018-02-23 MED ORDER — OXYCODONE HCL 5 MG PO TABS
5.0000 mg | ORAL_TABLET | ORAL | Status: DC | PRN
  Administered 2018-02-24 – 2018-02-26 (×5): 10 mg via ORAL
  Filled 2018-02-23 (×5): qty 2

## 2018-02-23 MED ORDER — GABAPENTIN 100 MG PO CAPS
200.0000 mg | ORAL_CAPSULE | Freq: Two times a day (BID) | ORAL | Status: DC
  Administered 2018-02-23 – 2018-02-27 (×9): 200 mg via ORAL
  Filled 2018-02-23 (×9): qty 2

## 2018-02-23 NOTE — Progress Notes (Signed)
Urology Progress Note   4 Days Post-Op  Subjective: Creatinine 1.2 pt with mild lightheadedness this normal. Foley draining clear urine  Objective: Vital signs in last 24 hours: Temp:  [97.7 F (36.5 C)-98.5 F (36.9 C)] 98.4 F (36.9 C) (06/14 1552) Pulse Rate:  [82-97] 84 (06/14 1552) Resp:  [11-20] 13 (06/14 1552) BP: (138-184)/(75-97) 159/97 (06/14 1552) SpO2:  [96 %-98 %] 96 % (06/14 1552)  Intake/Output from previous day: 06/13 0701 - 06/14 0700 In: 540 [P.O.:540] Out: 2350 [Urine:2350] Intake/Output this shift: Total I/O In: 345 [I.V.:30; Blood:315] Out: 500 [Urine:500]  Physical Exam:  General: Alert and oriented CV: RRR Lungs: Clear Abdomen: NGT in place Soft, appropriately tender. Right sided drains in place, island dressing dry GU: Foley in place draining clear yellow urine  Ext: NT, No erythema  Lab Results: Recent Labs    02/21/18 0500 02/22/18 0342 02/23/18 0402  HGB 8.8* 8.1* 7.5*  HCT 27.0* 24.5* 22.9*   BMET Recent Labs    02/22/18 0342 02/23/18 0402  NA 134* 131*  K 4.7 3.6  CL 102 101  CO2 25 24  GLUCOSE 188* 136*  BUN 13 14  CREATININE 1.28* 1.21  CALCIUM 8.7* 8.7*     Studies/Results: No results found.  Assessment/Plan:  72 y.o. male s/p Whipple and left radical nx.  Overall doing well post-op.  -discontinue foley catehter.  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 Pathology pending - will discuss with patient when available   Dispo: per primary team    LOS: 4 days   Nicolette Bang 02/23/2018, 4:04 PM

## 2018-02-23 NOTE — Progress Notes (Signed)
Patient received (2) Units PRBC without difficulty. (2) drains removed from RLQ and foley cath without any difficulty and patient tolerated well. After patient ambulated from the bed to bathroom , back to chair, abdominal dressing became saturated. Changed dressing and applied pressure dressing placing patient back in bed.  Patient became slightly confused and appeared tired. Called Dr on call for Dr Barry Dienes and informed him that patient's blood pressure had been up and now coming back down with dressing bleeding out some.  No new orders received.

## 2018-02-23 NOTE — Progress Notes (Signed)
Physical Therapy Treatment Patient Details Name: Manuel Olson MRN: [id number] DOB: 1945/10/09 Today's Date: 02/23/2018    History of Present Illness 72 y.o. male admitted on 02/19/18 for nephrectomy due to L nephrectomy and diagnostic laparoscopy, classic pancreaticoduodenectomy with portal vein resection, and placement of biliar and pancreatic duct stents (whipple).  Pt with other significant PMH of recent TAVR procedure (08/2017), stroke, DM2, pancreatic CA, MI, essential HTN, CAD, L shoulder arthroscopy with RTC repair, bil hand surgery, L eye surgery, HOH bil ears with hearing aids.       PT Comments    Pt is not progressing as quickly as I anticipated after initial evaluation.  He is getting his second unit of blood now and he still seems mildly confused and weak.  We were only able to walk to the bathroom and back with RW.  I believe he will need post acute rehab and would like to see if he would be an inpatient rehab candidate.     Follow Up Recommendations  CIR;Supervision for mobility/OOB     Equipment Recommendations  Rolling walker with 5" wheels;3in1 (PT)    Recommendations for Other Services   NA     Precautions / Restrictions Precautions Precautions: Fall;Other (comment) Precaution Comments: abdomen    Mobility  Bed Mobility Overal bed mobility: Needs Assistance Bed Mobility: Rolling;Sidelying to Sit Rolling: Min assist Sidelying to sit: Min assist       General bed mobility comments: Flattened HOB as pt reports he has a flat bed at home.  Verbal cues and manual assist for log roll to protect his abdomen.   Transfers Overall transfer level: Needs assistance Equipment used: Rolling walker (2 wheeled) Transfers: Sit to/from Stand Sit to Stand: Min assist         General transfer comment: Min assist to support trunk to boost up to standing.  Verbal cues for safe hand placement.    Ambulation/Gait Ambulation/Gait assistance: Mod assist Gait Distance  (Feet): 15 Feet Assistive device: Rolling walker (2 wheeled) Gait Pattern/deviations: Step-through pattern;Shuffle;Trunk flexed     General Gait Details: Pt needed up to mod assist during gait as he kept closing his eyes.  Assist at times for balance and to keep the RW from getting too far away from him.            Balance Overall balance assessment: Needs assistance Sitting-balance support: Feet supported;Bilateral upper extremity supported Sitting balance-Leahy Scale: Fair     Standing balance support: Bilateral upper extremity supported Standing balance-Leahy Scale: Poor                              Cognition Arousal/Alertness: Lethargic Behavior During Therapy: WFL for tasks assessed/performed Overall Cognitive Status: Impaired/Different from baseline Area of Impairment: Memory;Problem solving                     Memory: Decreased recall of precautions;Decreased short-term memory       Problem Solving: Slow processing;Decreased initiation;Difficulty sequencing;Requires verbal cues;Requires tactile cues General Comments: PT woke pt from sleeping and he keeps seemingly nodding off during the session (even in standing trying to urinate).  Vitals checked and were stable, so not hypotensive.  I believe fatigue.  He is also very [place] making communication and understanding difficult.          General Comments General comments (skin integrity, edema, etc.): Pt had no reports of lightheadedness in standing today, however, extremely  lethargic.  Vitals stable when checked multiple times during session (BP mildly elevated at 140/98).  Per wife, he has not slept well and he just fell asleep before PT came to walk with him.        Pertinent Vitals/Pain Pain Assessment: Faces Faces Pain Scale: Hurts little more Pain Location: abdomen and chronic low back Pain Descriptors / Indicators: Aching;Grimacing;Guarding Pain Intervention(s): Limited activity within  patient's tolerance;Monitored during session;Repositioned           PT Goals (current goals can now be found in the care plan section) Acute Rehab PT Goals Patient Stated Goal: to decrease pain Progress towards PT goals: Not progressing toward goals - comment(not progressing as quickly as thought. )    Frequency    Min 3X/week      PT Plan Discharge plan needs to be updated       AM-PAC PT "6 Clicks" Daily Activity  Outcome Measure  Difficulty turning over in bed (including adjusting bedclothes, sheets and blankets)?: A Little Difficulty moving from lying on back to sitting on the side of the bed? : A Little Difficulty sitting down on and standing up from a chair with arms (e.g., wheelchair, bedside commode, etc,.)?: A Little Help needed moving to and from a bed to chair (including a wheelchair)?: A Lot Help needed walking in hospital room?: A Lot Help needed climbing 3-5 steps with a railing? : A Lot 6 Click Score: 15    End of Session   Activity Tolerance: Patient limited by fatigue Patient left: in chair;with call bell/phone within reach;with chair alarm set   PT Visit Diagnosis: Muscle weakness (generalized) (M62.81);Difficulty in walking, not elsewhere classified (R26.2);Pain     Time: 1727-1749 PT Time Calculation (min) (ACUTE ONLY): 22 min  Charges:  $Therapeutic Activity: 8-22 mins          [name] B. [place], [place], DPT [phone]            02/23/2018, 6:01 PM

## 2018-02-23 NOTE — Progress Notes (Signed)
4 Days Post-Op   Subjective/Chief Complaint:\  Was very lightheaded with PT yesterday, but feeling great now.  Objective: Vital signs in last 24 hours: Temp:  [97.8 F (36.6 C)-98.5 F (36.9 C)] 97.8 F (36.6 C) (06/14 0742) Pulse Rate:  [83-97] 83 (06/14 0836) Resp:  [12-20] 19 (06/14 0742) BP: (154-188)/(75-89) 166/80 (06/14 0836) SpO2:  [96 %-98 %] 96 % (06/14 0742) Last BM Date: (PTA)  Intake/Output from previous day: 06/13 0701 - 06/14 0700 In: 540 [P.O.:540] Out: 2350 [Urine:2350] Intake/Output this shift: Total I/O In: -  Out: 500 [Urine:500]  General appearance: A&O x 3.  Resp: breathing comfortably Cardio: regular rate and rhythm GI: soft, non distended, drains serosang. Extremities: extremities normal, atraumatic, no cyanosis or edema  Lab Results:  Recent Labs    02/22/18 0342 02/23/18 0402  WBC 12.8* 9.4  HGB 8.1* 7.5*  HCT 24.5* 22.9*  PLT 56* 64*   BMET Recent Labs    02/22/18 0342 02/23/18 0402  NA 134* 131*  K 4.7 3.6  CL 102 101  CO2 25 24  GLUCOSE 188* 136*  BUN 13 14  CREATININE 1.28* 1.21  CALCIUM 8.7* 8.7*   PT/INR No results for input(s): LABPROT, INR in the last 72 hours. ABG No results for input(s): PHART, HCO3 in the last 72 hours.  Invalid input(s): PCO2, PO2  Studies/Results: No results found.  Anti-infectives: Anti-infectives (From admission, onward)   Start     Dose/Rate Route Frequency Ordered Stop   02/19/18 1800  Ampicillin-Sulbactam (UNASYN) 3 g in sodium chloride 0.9 % 100 mL IVPB     3 g 200 mL/hr over 30 Minutes Intravenous Every 6 hours 02/19/18 1634 02/19/18 1754   02/19/18 0630  ceFAZolin (ANCEF) IVPB 2g/100 mL premix     2 g 200 mL/hr over 30 Minutes Intravenous On call to O.R. 02/19/18 7473 02/19/18 1215   02/19/18 0628  ceFAZolin (ANCEF) 2-4 GM/100ML-% IVPB    Note to Pharmacy:  [name]   : cabinet override      02/19/18 0628 02/19/18 0815      Assessment/Plan: s/p  Procedure(s): DIAGNOSTIC LAPAROSCOPY, ERAS PATHWAY (N/A) WHIPPLE PROCEDURE (N/A) OPEN LEFT RADICAL NEPHRECTOMY (Left)   ABL anemia and anemia of chronic dx - symptomatic, transfuse.  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 Cr back down.   Aortic stenosis resolved s/p TAVR. Hypertension - convert back to PO coreg.  Add ace inhibitor. DM- lantus and SSI.  Sugars better. Soft diet  Oral pain meds.     LOS: 4 days    Manuel Olson 02/23/2018

## 2018-02-23 NOTE — Progress Notes (Signed)
1900: Handoff report received from RN. Pt resting in bed. Discussed plan of care for the shift with pt and wife at bedside; pt amenable to plan.  0000: Pt resting comfortably.  0300: Pt woken for vitals and newly agitated, attempting to get oob to the bathroom. Bedside commode offered, but pt insistent on walking to the bathroom. Gait belt and walker used; pt verbally aggressive towards staff and wife. Emotional support offered to pt's wife who was understandably distressed.  0700: Handoff report given to RN. No acute events overnight.

## 2018-02-24 LAB — BPAM RBC
Blood Product Expiration Date: 201907022359
Blood Product Expiration Date: 201907022359
Blood Product Expiration Date: 201907022359
Blood Product Expiration Date: 201907022359
ISSUE DATE / TIME: 201906111112
ISSUE DATE / TIME: 201906111112
ISSUE DATE / TIME: 201906141152
ISSUE DATE / TIME: 201906141504
Unit Type and Rh: 6200
Unit Type and Rh: 6200
Unit Type and Rh: 6200
Unit Type and Rh: 6200

## 2018-02-24 LAB — TYPE AND SCREEN
ABO/RH(D): A POS
Antibody Screen: NEGATIVE
Unit division: 0
Unit division: 0
Unit division: 0
Unit division: 0

## 2018-02-24 LAB — COMPREHENSIVE METABOLIC PANEL
ALT: 29 U/L (ref 17–63)
AST: 15 U/L (ref 15–41)
Albumin: 3.2 g/dL — ABNORMAL LOW (ref 3.5–5.0)
Alkaline Phosphatase: 53 U/L (ref 38–126)
Anion gap: 8 (ref 5–15)
BUN: 25 mg/dL — ABNORMAL HIGH (ref 6–20)
CO2: 25 mmol/L (ref 22–32)
Calcium: 8.6 mg/dL — ABNORMAL LOW (ref 8.9–10.3)
Chloride: 100 mmol/L — ABNORMAL LOW (ref 101–111)
Creatinine, Ser: 1.26 mg/dL — ABNORMAL HIGH (ref 0.61–1.24)
GFR calc Af Amer: >60 mL/min (ref >60)
GFR calc non Af Amer: 54 mL/min — ABNORMAL LOW (ref >60)
Glucose, Bld: 122 mg/dL — ABNORMAL HIGH (ref 65–99)
Potassium: 3.9 mmol/L (ref 3.5–5.1)
Sodium: 133 mmol/L — ABNORMAL LOW (ref 135–145)
Total Bilirubin: 1.4 mg/dL — ABNORMAL HIGH (ref 0.3–1.2)
Total Protein: 5.5 g/dL — ABNORMAL LOW (ref 6.5–8.1)

## 2018-02-24 LAB — GLUCOSE, CAPILLARY
Glucose-Capillary: 127 mg/dL — ABNORMAL HIGH (ref 65–99)
Glucose-Capillary: 121 mg/dL — ABNORMAL HIGH (ref 65–99)
Glucose-Capillary: 122 mg/dL — ABNORMAL HIGH (ref 65–99)
Glucose-Capillary: 112 mg/dL — ABNORMAL HIGH (ref 65–99)
Glucose-Capillary: 133 mg/dL — ABNORMAL HIGH (ref 65–99)
Glucose-Capillary: 135 mg/dL — ABNORMAL HIGH (ref 65–99)

## 2018-02-24 MED ORDER — ENOXAPARIN SODIUM 30 MG/0.3ML ~~LOC~~ SOLN
30.0000 mg | Freq: Two times a day (BID) | SUBCUTANEOUS | Status: DC
  Administered 2018-02-24 – 2018-02-27 (×7): 30 mg via SUBCUTANEOUS
  Filled 2018-02-24 (×7): qty 0.3

## 2018-02-24 NOTE — Progress Notes (Signed)
1900: Handoff report received from RN. Pt sleeping with wife at bedside. Discussed plan of care for the shift; pt amenable to plan.  0000: Pt resting comfortably.  0400: Pt continues resting comfortably.  0700: Handoff report given to RN. No acute events overnight.

## 2018-02-24 NOTE — Progress Notes (Signed)
Patient ID: Manuel Olson, male   DOB: 1946-07-29, 72 y.o.   MRN: [id number] 5 Days Post-Op   Subjective: No specific complaints this morning.  Wife reports he was very confused last night but back to normal this morning.  Ate just a few bites yesterday but no appetite.  Denies nausea or vomiting.  Mild pain.  Feels a little bloated.  Had a bowel movement last night.  Objective: Vital signs in last 24 hours: Temp:  [97.5 F (36.4 C)-98.8 F (37.1 C)] 97.5 F (36.4 C) (06/15 0822) Pulse Rate:  [81-93] 81 (06/15 1000) Resp:  [9-16] 14 (06/15 1000) BP: (126-184)/(66-99) 178/83 (06/15 1000) SpO2:  [95 %-99 %] 95 % (06/15 1000) Last BM Date: 02/24/18  Intake/Output from previous day: 06/14 0701 - 06/15 0700 In: 4005 [P.O.:120; I.V.:3255; Blood:630] Out: 880 [Urine:850; Drains:30] Intake/Output this shift: No intake/output data recorded.  General appearance: alert, cooperative and no distress Resp: clear to auscultation bilaterally GI: Mild diffuse tenderness.  Nondistended. Incision/Wound: No erythema or drainage.  Lab Results:  Recent Labs    02/22/18 0342 02/23/18 0402  WBC 12.8* 9.4  HGB 8.1* 7.5*  HCT 24.5* 22.9*  PLT 56* 64*   BMET Recent Labs    02/22/18 0342 02/23/18 0402  NA 134* 131*  K 4.7 3.6  CL 102 101  CO2 25 24  GLUCOSE 188* 136*  BUN 13 14  CREATININE 1.28* 1.21  CALCIUM 8.7* 8.7*   Hemoglobin 10.2 today and platelet count 85,000  Studies/Results: No results found.  Anti-infectives: Anti-infectives (From admission, onward)   Start     Dose/Rate Route Frequency Ordered Stop   02/19/18 1800  Ampicillin-Sulbactam (UNASYN) 3 g in sodium chloride 0.9 % 100 mL IVPB     3 g 200 mL/hr over 30 Minutes Intravenous Every 6 hours 02/19/18 1634 02/19/18 1754   02/19/18 0630  ceFAZolin (ANCEF) IVPB 2g/100 mL premix     2 g 200 mL/hr over 30 Minutes Intravenous On call to O.R. 02/19/18 8101 02/19/18 1215   02/19/18 0628  ceFAZolin (ANCEF) 2-4  GM/100ML-% IVPB    Note to Pharmacy:  [name]   : cabinet override      02/19/18 0628 02/19/18 0815      Assessment/Plan: s/p Procedure(s): DIAGNOSTIC LAPAROSCOPY, ERAS PATHWAY WHIPPLE PROCEDURE OPEN LEFT RADICAL NEPHRECTOMY Stable postoperatively without major problems.  Nighttime confusion which has resolved.  Hemoglobin up appropriately with transfusion. Platelet count up, will start Lovenox.  Offer diet as tolerated. Hyponatremia, a.m. lab pending.   LOS: 5 days    Edward Jolly 02/24/2018

## 2018-02-24 NOTE — Progress Notes (Signed)
OT Cancellation Note  Patient Details Name: Manuel Olson MRN: [id number] DOB: 06/02/1946   Cancelled Treatment:    Reason Eval/Treat Not Completed: Fatigue/lethargy limiting ability to participate.  Attempted x 2.  [place], OTR/L [phone]   Lucille Passy M 02/24/2018, 5:58 PM

## 2018-02-25 LAB — CBC
HCT: 30.4 % — ABNORMAL LOW (ref 39.0–52.0)
HCT: 28.4 % — ABNORMAL LOW (ref 39.0–52.0)
Hemoglobin: 10.2 g/dL — ABNORMAL LOW (ref 13.0–17.0)
Hemoglobin: 9.4 g/dL — ABNORMAL LOW (ref 13.0–17.0)
MCH: 30.1 pg (ref 26.0–34.0)
MCH: 30.1 pg (ref 26.0–34.0)
MCHC: 33.6 g/dL (ref 30.0–36.0)
MCHC: 33.1 g/dL (ref 30.0–36.0)
MCV: 89.7 fL (ref 78.0–100.0)
MCV: 91 fL (ref 78.0–100.0)
Platelets: 85 10*3/uL — ABNORMAL LOW (ref 150–400)
Platelets: 97 10*3/uL — ABNORMAL LOW (ref 150–400)
RBC: 3.39 MIL/uL — ABNORMAL LOW (ref 4.22–5.81)
RBC: 3.12 MIL/uL — ABNORMAL LOW (ref 4.22–5.81)
RDW: 15.9 % — ABNORMAL HIGH (ref 11.5–15.5)
RDW: 15.5 % (ref 11.5–15.5)
WBC: 8.4 10*3/uL (ref 4.0–10.5)
WBC: 9.5 10*3/uL (ref 4.0–10.5)

## 2018-02-25 LAB — GLUCOSE, CAPILLARY
Glucose-Capillary: 120 mg/dL — ABNORMAL HIGH (ref 65–99)
Glucose-Capillary: 123 mg/dL — ABNORMAL HIGH (ref 65–99)
Glucose-Capillary: 152 mg/dL — ABNORMAL HIGH (ref 65–99)
Glucose-Capillary: 159 mg/dL — ABNORMAL HIGH (ref 65–99)
Glucose-Capillary: 176 mg/dL — ABNORMAL HIGH (ref 65–99)
Glucose-Capillary: 89 mg/dL (ref 65–99)
Glucose-Capillary: 92 mg/dL (ref 65–99)

## 2018-02-25 MED ORDER — ENSURE ENLIVE PO LIQD
237.0000 mL | Freq: Two times a day (BID) | ORAL | Status: DC
  Administered 2018-02-25 – 2018-02-27 (×6): 237 mL via ORAL

## 2018-02-25 NOTE — Progress Notes (Signed)
[facility]/Trauma Progress Note  6 Days Post-Op   Assessment/Plan  Pancreatic cancer - S/P diagnostic laparoscopy, eras pathway, whipple procedure, open left radical nephrectomy, Dr. Barry Dienes and Dr. Alyson Ingles, 06/10 - Urology following for renal path  FEN: soft diet VTE: SCD's, lovenox ID: no abx currently Foley: discontinued per urology 06/14 Follow up: Dr. Barry Dienes and Urology  DISPO: PT recommending CIR, Hg stable, AM labs, CBC pending to check platelets     LOS: 6 days    Subjective: CC: abdominal pain 6/10  Pain medicine is helping. Had a BM yesterday. Feels like he needs to have another one. Tolerating diet. Will drink ensure. No family at bedside.   Objective: Vital signs in last 24 hours: Temp:  [98.2 F (36.8 C)-99.3 F (37.4 C)] 98.4 F (36.9 C) (06/16 0403) Pulse Rate:  [78-88] 82 (06/16 0900) Resp:  [9-21] 11 (06/16 0403) BP: (127-178)/(51-84) 173/76 (06/16 0900) SpO2:  [94 %-98 %] 95 % (06/16 0403) Last BM Date: 02/24/18  Intake/Output from previous day: No intake/output data recorded. Intake/Output this shift: No intake/output data recorded.  PE: Gen:  Alert, NAD, pleasant, cooperative Card:  RRR, no M/G/R heard Pulm:  CTA, no W/R/R, effort normal Abd: Soft, ND, +BS, incision with staples intact is without signs of infection or drainage. Mild generalized TTP without guarding. No peritonitis.  Skin: no rashes noted, warm and dry   Anti-infectives: Anti-infectives (From admission, onward)   Start     Dose/Rate Route Frequency Ordered Stop   02/19/18 1800  Ampicillin-Sulbactam (UNASYN) 3 g in sodium chloride 0.9 % 100 mL IVPB     3 g 200 mL/hr over 30 Minutes Intravenous Every 6 hours 02/19/18 1634 02/19/18 1754   02/19/18 0630  ceFAZolin (ANCEF) IVPB 2g/100 mL premix     2 g 200 mL/hr over 30 Minutes Intravenous On call to O.R. 02/19/18 0625 02/19/18 1215   02/19/18 0628  ceFAZolin (ANCEF) 2-4 GM/100ML-% IVPB    Note to Pharmacy:   Manuel Olson   : cabinet override      02/19/18 0628 02/19/18 0815      Lab Results:  Recent Labs    02/23/18 0402 02/24/18 0951  WBC 9.4 8.4  HGB 7.5* 10.2*  HCT 22.9* 30.4*  PLT 64* 85*   BMET Recent Labs    02/23/18 0402 02/24/18 0951  NA 131* 133*  K 3.6 3.9  CL 101 100*  CO2 24 25  GLUCOSE 136* 122*  BUN 14 25*  CREATININE 1.21 1.26*  CALCIUM 8.7* 8.6*   PT/INR No results for input(s): LABPROT, INR in the last 72 hours. CMP     Component Value Date/Time   NA 133 (L) 02/24/2018 0951   NA 139 09/07/2017 0844   K 3.9 02/24/2018 0951   K 5.4 No visable hemolysis (H) 09/07/2017 0844   CL 100 (L) 02/24/2018 0951   CO2 25 02/24/2018 0951   CO2 27 09/07/2017 0844   GLUCOSE 122 (H) 02/24/2018 0951   GLUCOSE 227 (H) 09/07/2017 0844   BUN 25 (H) 02/24/2018 0951   BUN 29.8 (H) 09/07/2017 0844   CREATININE 1.26 (H) 02/24/2018 0951   CREATININE 0.76 12/13/2017 1100   CREATININE 1.2 09/07/2017 0844   CALCIUM 8.6 (L) 02/24/2018 0951   CALCIUM 9.8 09/07/2017 0844   PROT 5.5 (L) 02/24/2018 0951   PROT 6.9 09/07/2017 0844   ALBUMIN 3.2 (L) 02/24/2018 0951   ALBUMIN 3.9 09/07/2017 0844   AST 15 02/24/2018 0951   AST  16 12/13/2017 1100   AST 13 09/07/2017 0844   ALT 29 02/24/2018 0951   ALT 13 12/13/2017 1100   ALT 12 09/07/2017 0844   ALKPHOS 53 02/24/2018 0951   ALKPHOS 89 09/07/2017 0844   BILITOT 1.4 (H) 02/24/2018 0951   BILITOT 0.2 12/13/2017 1100   BILITOT 0.37 09/07/2017 0844   GFRNONAA 54 (L) 02/24/2018 0951   GFRNONAA >60 12/13/2017 1100   GFRAA >60 02/24/2018 0951   GFRAA >60 12/13/2017 1100   Lipase     Component Value Date/Time   LIPASE 19 09/29/2017 1114    Studies/Results: No results found.    Manuel Olson , [facility] 02/25/2018, 9:53 AM  Pager: [phone] Mon-Wed, Friday 7:00am-4:30pm Thurs 7am-11:30am  Consults: [phone]

## 2018-02-25 NOTE — Progress Notes (Signed)
Occupational Therapy Evaluation Patient Details Name: Manuel Olson MRN: [id number] DOB: September 11, 1946 Today's Date: 02/25/2018    History of Present Illness 72 y.o. male admitted on 02/19/18 for nephrectomy due to L nephrectomy and diagnostic laparoscopy, classic pancreaticoduodenectomy with portal vein resection, and placement of biliar and pancreatic duct stents (whipple).  Pt with other significant PMH of recent TAVR procedure (08/2017), stroke, DM2, pancreatic CA, MI, essential HTN, CAD, L shoulder arthroscopy with RTC repair, bil hand surgery, L eye surgery, HOH bil ears with hearing aids.      Clinical Impression   Per nsg pt given pain meds prior to session. Pt sleeping on arrival. Pt apparently confused and impulsive at times during session (wife states he "gets this way with pain meds"), limiting pt's ability to safely ambulate. Feel pt will progress well when not confused from pain meds and is wearing his hearing aids. Will follow acutely to further assess appropriate DC venue as pt may be able to progress to [facility].     Follow Up Recommendations  Supervision/Assistance - 24 hour;Home health OT vs CIR (pending progress)   Equipment Recommendations  3 in 1 bedside commode;Other (comment)(RW)    Recommendations for Other Services       Precautions / Restrictions Precautions Precautions: Fall;Other (comment) Precaution Comments: abdomen Restrictions Weight Bearing Restrictions: No      Mobility Bed Mobility Overal bed mobility: Needs Assistance Bed Mobility: Sit to Supine       Sit to supine: HOB elevated;Supervision      Transfers Overall transfer level: Needs assistance Equipment used: Rolling walker (2 wheeled) Transfers: Sit to/from Stand Sit to Stand: Min assist         General transfer comment: no physical assist needed    Balance Overall balance assessment: Needs assistance Sitting-balance support: Feet supported;Bilateral upper extremity  supported Sitting balance-Leahy Scale: Good     Standing balance support: Bilateral upper extremity supported Standing balance-Leahy Scale: Fair Standing balance comment: able to release RW to stand to urinate                           ADL either performed or assessed with clinical judgement   ADL Overall ADL's : Needs assistance/impaired     Grooming: Set up;Supervision/safety;Sitting   Upper Body Bathing: Set up;Supervision/ safety;Sitting   Lower Body Bathing: Minimal assistance;Sit to/from stand   Upper Body Dressing : Minimal assistance;Sitting   Lower Body Dressing: Sit to/from stand;Minimal assistance   Toilet Transfer: Minimal assistance;RW;Ambulation   Toileting- Clothing Manipulation and Hygiene: Minimal assistance(to steady pt)       Functional mobility during ADLs: Minimal assistance;Rolling walker;Cueing for safety;Cueing for sequencing General ADL Comments: impulsive and unsafe during mobility; wife very involved with care - requires cuing to not unplug monitor wires     Vision         Perception     Praxis      Pertinent Vitals/Pain Pain Assessment: Faces Faces Pain Scale: Hurts little more Pain Location: abdomen and chronic low back Pain Descriptors / Indicators: Aching;Grimacing;Guarding Pain Intervention(s): Limited activity within patient's tolerance     Hand Dominance Right   Extremity/Trunk Assessment Upper Extremity Assessment Upper Extremity Assessment: Generalized weakness   Lower Extremity Assessment Lower Extremity Assessment: Defer to PT evaluation   Cervical / Trunk Assessment Cervical / Trunk Assessment: Other exceptions Cervical / Trunk Exceptions: pt reports h/o chronic low back pain. protective posture for abdomen   Communication Communication Communication:  HOH(bil hearing aids)   Cognition Arousal/Alertness: Lethargic;Suspect due to medications Behavior During Therapy: Impulsive Overall Cognitive Status:  Impaired/Different from baseline Area of Impairment: Memory;Problem solving;Following commands;Safety/judgement;Awareness                     Memory: Decreased recall of precautions;Decreased short-term memory Following Commands: Follows one step commands with increased time Safety/Judgement: Decreased awareness of safety;Decreased awareness of deficits Awareness: Emergent Problem Solving: Slow processing;Decreased initiation;Difficulty sequencing;Requires verbal cues;Requires tactile cues General Comments: Pt had pain meds prior to session adn wife states he "gets like this with pain meds"   General Comments       Exercises     Shoulder Instructions      Home Living Family/patient expects to be discharged to:: Private residence Living Arrangements: Spouse/significant other Available Help at Discharge: Family;Available 24 hours/day Type of Home: House Home Access: Stairs to enter [employer] of Steps: 3(small) Entrance Stairs-Rails: None(something there to pull on, but not a formal rail. ) Home Layout: One level     Bathroom Shower/Tub: Teacher, early years/pre: Standard Bathroom Accessibility: Yes How Accessible: Accessible via walker Home Equipment: None          Prior Functioning/Environment Level of Independence: Independent        Comments: drives, retired        OT Problem List: Decreased strength;Decreased activity tolerance;Impaired balance (sitting and/or standing);Decreased cognition;Decreased safety awareness;Decreased knowledge of use of DME or AE;Decreased knowledge of precautions;Pain      OT Treatment/Interventions: Self-care/ADL training;Therapeutic exercise;Energy conservation;DME and/or AE instruction;Therapeutic activities;Cognitive remediation/compensation;Patient/family education;Balance training    OT Goals(Current goals can be found in the care plan section) Acute Rehab OT Goals Patient Stated Goal: to be  independent OT Goal Formulation: With patient/family Time For Goal Achievement: 03/11/18 Potential to Achieve Goals: Good  OT Frequency: Min 2X/week   Barriers to D/C:            Co-evaluation              AM-PAC PT "6 Clicks" Daily Activity     Outcome Measure Help from another person eating meals?: None Help from another person taking care of personal grooming?: A Little Help from another person toileting, which includes using toliet, bedpan, or urinal?: A Little Help from another person bathing (including washing, rinsing, drying)?: A Little Help from another person to put on and taking off regular upper body clothing?: A Little Help from another person to put on and taking off regular lower body clothing?: A Little 6 Click Score: 19   End of Session Equipment Utilized During Treatment: Gait belt;Rolling walker Nurse Communication: Mobility status  Activity Tolerance: Patient tolerated treatment well Patient left: in bed;with call bell/phone within reach;with family/visitor present  OT Visit Diagnosis: Other abnormalities of gait and mobility (R26.89);Muscle weakness (generalized) (M62.81);Other symptoms and signs involving cognitive function;Pain Pain - part of body: (abdomen)                Time: 7062-3762 OT Time Calculation (min): 26 min Charges:  OT General Charges $OT Visit: 1 Visit OT Evaluation $OT Eval Moderate Complexity: 1 Mod OT Treatments $Self Care/Home Management : 8-22 mins G-Codes:     Maurie Boettcher, OT/L  OT Clinical Specialist [phone]   [facility] 02/25/2018, 4:38 PM

## 2018-02-25 NOTE — Progress Notes (Signed)
1900: Handoff report received from RN. Pt resting in bed. Discussed plan of care for the shift; pt amenable to plan.  0000: Pt resting comfortably.  0400: Pt continues resting comfortably.  0700: Handoff report given to RN. No acute events overnight.

## 2018-02-26 LAB — GLUCOSE, CAPILLARY
Glucose-Capillary: 133 mg/dL — ABNORMAL HIGH (ref 65–99)
Glucose-Capillary: 123 mg/dL — ABNORMAL HIGH (ref 65–99)
Glucose-Capillary: 141 mg/dL — ABNORMAL HIGH (ref 65–99)
Glucose-Capillary: 108 mg/dL — ABNORMAL HIGH (ref 65–99)
Glucose-Capillary: 82 mg/dL (ref 65–99)
Glucose-Capillary: 123 mg/dL — ABNORMAL HIGH (ref 65–99)

## 2018-02-26 LAB — COMPREHENSIVE METABOLIC PANEL
ALT: 22 U/L (ref 17–63)
AST: 16 U/L (ref 15–41)
Albumin: 2.9 g/dL — ABNORMAL LOW (ref 3.5–5.0)
Alkaline Phosphatase: 62 U/L (ref 38–126)
Anion gap: 7 (ref 5–15)
BUN: 23 mg/dL — ABNORMAL HIGH (ref 6–20)
CO2: 26 mmol/L (ref 22–32)
Calcium: 8.6 mg/dL — ABNORMAL LOW (ref 8.9–10.3)
Chloride: 100 mmol/L — ABNORMAL LOW (ref 101–111)
Creatinine, Ser: 1.38 mg/dL — ABNORMAL HIGH (ref 0.61–1.24)
GFR calc Af Amer: 57 mL/min — ABNORMAL LOW (ref >60)
GFR calc non Af Amer: 49 mL/min — ABNORMAL LOW (ref >60)
Glucose, Bld: 142 mg/dL — ABNORMAL HIGH (ref 65–99)
Potassium: 3.7 mmol/L (ref 3.5–5.1)
Sodium: 133 mmol/L — ABNORMAL LOW (ref 135–145)
Total Bilirubin: 1.2 mg/dL (ref 0.3–1.2)
Total Protein: 5.1 g/dL — ABNORMAL LOW (ref 6.5–8.1)

## 2018-02-26 LAB — CBC
HCT: 28.7 % — ABNORMAL LOW (ref 39.0–52.0)
Hemoglobin: 9.4 g/dL — ABNORMAL LOW (ref 13.0–17.0)
MCH: 30.4 pg (ref 26.0–34.0)
MCHC: 32.8 g/dL (ref 30.0–36.0)
MCV: 92.9 fL (ref 78.0–100.0)
Platelets: 116 10*3/uL — ABNORMAL LOW (ref 150–400)
RBC: 3.09 MIL/uL — ABNORMAL LOW (ref 4.22–5.81)
RDW: 15.4 % (ref 11.5–15.5)
WBC: 11.2 10*3/uL — ABNORMAL HIGH (ref 4.0–10.5)

## 2018-02-26 MED ORDER — ENSURE ENLIVE PO LIQD: 237.0000 mL | Freq: Two times a day (BID) | ORAL | 12 refills | Status: DC

## 2018-02-26 MED ORDER — ACETAMINOPHEN 325 MG PO TABS: 650.0000 mg | ORAL_TABLET | Freq: Four times a day (QID) | ORAL | 0 refills | Status: DC | PRN

## 2018-02-26 MED ORDER — OXYCODONE HCL 5 MG PO TABS: 5.0000 mg | ORAL_TABLET | ORAL | 0 refills | Status: DC | PRN

## 2018-02-26 MED ORDER — BACLOFEN 10 MG PO TABS: 10.0000 mg | ORAL_TABLET | Freq: Three times a day (TID) | ORAL | 0 refills | Status: DC | PRN

## 2018-02-26 MED ORDER — GABAPENTIN 100 MG PO CAPS: 200.0000 mg | ORAL_CAPSULE | Freq: Two times a day (BID) | ORAL | 1 refills | Status: DC

## 2018-02-26 MED ORDER — PANTOPRAZOLE SODIUM 40 MG PO TBEC: 40.0000 mg | DELAYED_RELEASE_TABLET | Freq: Every day | ORAL | 3 refills | Status: DC

## 2018-02-26 MED ORDER — ONDANSETRON 4 MG PO TBDP: 4.0000 mg | ORAL_TABLET | Freq: Four times a day (QID) | ORAL | 0 refills | Status: DC | PRN

## 2018-02-26 NOTE — Progress Notes (Signed)
1900: Handoff report received from RN. Pt sleeping with wife at bedside. Discussed plan of care for the shift with wife; amenable to plan and very excited about progression towards rehab.  0000: Pt resting comfortably.  0400: Pt continues resting comfortably.  0700: Handoff report given to RN. No acute events overnight.

## 2018-02-26 NOTE — Consult Note (Signed)
Physical Medicine and Rehabilitation Consult Reason for Consult: Decreased functional mobility Referring Physician: Dr. Barry Dienes   HPI: Manuel Olson is a 72 y.o. right-handed male with history of CAD CVA, diabetes mellitus.  Per chart review patient lives with spouse.  Independent prior to admission.  One level home.  Patient is retired.  Presented 02/19/2018 with recent diagnosis of pancreatic cancer incidentally identified while undergoing work-up for aortic valve surgery.  Patient had TAVR procedure then neoadjuvant treatment.  Also noted left renal cancer diagnosis left renal cancer diagnosis.  Underwent pancreaticuduodenectomy with portal vein resection and placement of biliary and pancreatic duct stents 02/19/2018 per Dr. Barry Dienes as well as open left radical nephrectomy per Dr. Alyson Ingles.  Hospital course anemia.  Patient was transfused.  Later placed on Lovenox for DVT prophylaxis.  Intermittent bouts of confusion that has steadily improved.  Pathology report remains pending.  Diet has slowly been advanced.  Physical and occupational therapy evaluations have been completed.  Patient wax and wane with functional mobility.  Recommendations for physical medicine rehab consult.   Review of Systems  Constitutional: Negative for chills and fever.  HENT: Negative for hearing loss.   Eyes: Negative for blurred vision and double vision.  Respiratory: Positive for shortness of breath. Negative for cough.   Cardiovascular: Positive for palpitations and leg swelling. Negative for chest pain.  Gastrointestinal: Positive for nausea.       GERD  Genitourinary: Negative for dysuria, flank pain and hematuria.  Musculoskeletal: Positive for myalgias.  Skin: Negative for rash.  All other systems reviewed and are negative.  Past Medical History:  Diagnosis Date  . Arthritis   . CAD (coronary artery disease)    a. 11/2016 in [place]: STEMI s/p DES to mid LAD, DES to diagonal, DES x 2 to proximal and  mid RCA   . Essential hypertension   . Former smoker   . GERD (gastroesophageal reflux disease)   . Heart murmur   . Hyperlipidemia   . Myocardial infarction ([place])   . Pancreatic cancer ([place])   . Pneumonia   . Renal mass   . S/P TAVR (transcatheter aortic valve replacement)    a. 08/2017:  Edwards Sapien 3 THV (size 26 mm, model #9600CM26A , serial # [id number])  . Severe aortic stenosis    a. 08/2017: s/p TAVR by Dr. Angelena Form and Dr. Cyndia Bent.   . Stroke [facility])    "stroke in left eye"  . Type 2 diabetes mellitus ([place])    Past Surgical History:  Procedure Laterality Date  . APPENDECTOMY    . CARDIAC CATHETERIZATION    . EUS N/A 08/02/2017   Procedure: UPPER ENDOSCOPIC ULTRASOUND (EUS) RADIAL;  Surgeon: [name], MD;  Location: [facility];  Service: Endoscopy;  Laterality: N/A;  . EYE SURGERY Left   . HAND SURGERY Bilateral   . HERNIA REPAIR Right   . LAPAROSCOPY N/A 02/19/2018   Procedure: DIAGNOSTIC LAPAROSCOPY, ERAS PATHWAY;  Surgeon: [name], MD;  Location: [place];  Service: General;  Laterality: N/A;  . NEPHRECTOMY Left 02/19/2018   Procedure: OPEN LEFT RADICAL NEPHRECTOMY;  Surgeon: Cleon Gustin, MD;  Location: [place];  Service: Urology;  Laterality: Left;  . PORTACATH PLACEMENT N/A 09/15/2017   Procedure: INSERTION PORT-A-CATH;  Surgeon: [name], MD;  Location: [place];  Service: General;  Laterality: N/A;  . RIGHT/LEFT HEART CATH AND CORONARY ANGIOGRAPHY N/A 07/05/2017   Procedure: RIGHT/LEFT HEART CATH AND CORONARY ANGIOGRAPHY;  Surgeon: [name], MD;  Location: [facility];  Service: Cardiovascular;  Laterality: N/A;  . SHOULDER ARTHROSCOPY WITH ROTATOR CUFF REPAIR Left   . TEE WITHOUT CARDIOVERSION N/A 08/22/2017   Procedure: TRANSESOPHAGEAL ECHOCARDIOGRAM (TEE);  Surgeon: Burnell Blanks, MD;  Location: [place];  Service: Open Heart Surgery;  Laterality: N/A;  . TRANSCATHETER AORTIC VALVE REPLACEMENT, TRANSFEMORAL  N/A 08/22/2017   Procedure: TRANSCATHETER AORTIC VALVE REPLACEMENT, TRANSFEMORAL;  Surgeon: Burnell Blanks, MD;  Location: [place];  Service: Open Heart Surgery;  Laterality: N/A;  . WHIPPLE PROCEDURE N/A 02/19/2018   Procedure: WHIPPLE PROCEDURE;  Surgeon: [name], MD;  Location: [facility] OR;  Service: General;  Laterality: N/A;   Family History  Problem Relation Age of Onset  . Lupus Mother   . CAD Brother   . Hypertension Brother    Social History:  reports that he quit smoking about 22 months ago. His smoking use included cigarettes. He started smoking about 59 years ago. He has a 57.00 pack-year smoking history. He has never used smokeless tobacco. He reports that he does not drink alcohol or use drugs. Allergies: No Known Allergies Medications Prior to Admission  Medication Sig Dispense Refill  . aspirin EC 81 MG tablet Take 1 tablet (81 mg total) by mouth daily. (Patient taking differently: Take 81 mg by mouth daily at 2 PM. ) 90 tablet 3  . atorvastatin (LIPITOR) 20 MG tablet Take 1 tablet (20 mg total) by mouth daily. 90 tablet 1  . carvedilol (COREG) 3.125 MG tablet TAKE (1) TABLET BY MOUTH TWICE A DAY WITH MEALS (BREAKFAST AND SUPPER) 180 tablet 2  . clopidogrel (PLAVIX) 75 MG tablet Take 1 tablet (75 mg total) by mouth daily. (Patient taking differently: Take 75 mg by mouth daily with breakfast. ) 90 tablet 1  . Insulin Glargine (BASAGLAR KWIKPEN) 100 UNIT/ML SOPN Inject 0.2 mLs (20 Units total) into the skin at bedtime. 5 pen 5  . insulin lispro (HUMALOG) 100 UNIT/ML injection Inject 0.04-0.06 mLs (4-6 Units total) into the skin 3 (three) times daily before meals. Pens. (Patient taking differently: Inject 6 Units into the skin 2 (two) times daily before a meal. Breakfast & supper) 15 mL 11  . lidocaine-prilocaine (EMLA) cream Apply 1 application topically as needed. Apply small amount of cream over port site 1-2 hrs prior to chemo, cover with plastic wrap. (Patient taking  differently: Apply 1 application topically as needed (for recieving chemo in port site). Apply small amount of cream over port site 1-2 hrs prior to chemo, cover with plastic wrap.) 30 g 0  . lisinopril (PRINIVIL,ZESTRIL) 10 MG tablet Take 1 tablet (10 mg total) by mouth daily. 90 tablet 2  . baclofen (LIORESAL) 10 MG tablet Take 0.5 tablets (5 mg total) by mouth 3 (three) times daily as needed for muscle spasms. 30 each 0  . diphenoxylate-atropine (LOMOTIL) 2.5-0.025 MG tablet Take 2 tablets by mouth 4 (four) times daily as needed for diarrhea or loose stools. 60 tablet 0  . Insulin Pen Needle (CAREFINE PEN NEEDLES) 32G X 4 MM MISC Use 1x a day 100 each 3  . ondansetron (ZOFRAN) 8 MG tablet Take 1 tablet (8 mg total) by mouth every 8 (eight) hours as needed for nausea or vomiting. 20 tablet 0  . prochlorperazine (COMPAZINE) 5 MG tablet Take 1 tablet (5 mg total) by mouth every 6 (six) hours as needed for nausea or vomiting. 30 tablet 0    Home: Home Living Family/patient  expects to be discharged to:: Private residence Living Arrangements: Spouse/significant other Available Help at Discharge: Family, Available 24 hours/day Type of Home: House Home Access: Stairs to enter [employer] of Steps: 3(small) Entrance Stairs-Rails: None(something there to pull on, but not a formal rail. ) Home Layout: One level Bathroom Shower/Tub: Chiropodist: Standard Bathroom Accessibility: Yes Home Equipment: None  Functional History: Prior Function Level of Independence: Independent Comments: drives, retired Functional Status:  Mobility: Bed Mobility Overal bed mobility: Needs Assistance Bed Mobility: Sit to Supine Rolling: Min assist Sidelying to sit: Min assist Sit to supine: HOB elevated, Supervision General bed mobility comments: Flattened HOB as pt reports he has a flat bed at home.  Verbal cues and manual assist for log roll to protect his abdomen.   Transfers Overall transfer level: Needs assistance Equipment used: Rolling walker (2 wheeled) Transfers: Sit to/from Stand Sit to Stand: Min assist General transfer comment: no physical assist needed Ambulation/Gait Ambulation/Gait assistance: Mod assist Gait Distance (Feet): 15 Feet Assistive device: Rolling walker (2 wheeled) Gait Pattern/deviations: Step-through pattern, Shuffle, Trunk flexed General Gait Details: Pt needed up to mod assist during gait as he kept closing his eyes.  Assist at times for balance and to keep the RW from getting too far away from him.      ADL: ADL Overall ADL's : Needs assistance/impaired Grooming: Set up, Supervision/safety, Sitting Upper Body Bathing: Set up, Supervision/ safety, Sitting Lower Body Bathing: Minimal assistance, Sit to/from stand Upper Body Dressing : Minimal assistance, Sitting Lower Body Dressing: Sit to/from stand, Minimal assistance Toilet Transfer: Minimal assistance, RW, Ambulation Toileting- Clothing Manipulation and Hygiene: Minimal assistance(to steady pt) Functional mobility during ADLs: Minimal assistance, Rolling walker, Cueing for safety, Cueing for sequencing General ADL Comments: impulsive and unsafe during mobility; wife very involved with care  Cognition: Cognition Overall Cognitive Status: Impaired/Different from baseline Orientation Level: Oriented X4 Cognition Arousal/Alertness: Lethargic, Suspect due to medications Behavior During Therapy: Impulsive Overall Cognitive Status: Impaired/Different from baseline Area of Impairment: Memory, Problem solving, Following commands, Safety/judgement, Awareness Memory: Decreased recall of precautions, Decreased short-term memory Following Commands: Follows one step commands with increased time Safety/Judgement: Decreased awareness of safety, Decreased awareness of deficits Awareness: Emergent Problem Solving: Slow processing, Decreased initiation, Difficulty  sequencing, Requires verbal cues, Requires tactile cues General Comments: Pt had pain meds prior to session adn wife states he "gets like this with pain meds"  Blood pressure (!) 150/87, pulse 80, temperature 98.8 F (37.1 C), resp. rate 16, weight 73.9 kg (162 lb 14.7 oz), SpO2 95 %. Physical Exam  Neurological:  Patient sitting up in chair.  Alert and oriented to person and place but could not recall his full hospital stay.    Results for orders placed or performed during the hospital encounter of 02/19/18 (from the past 24 hour(s))  Glucose, capillary     Status: Abnormal   Collection Time: 02/25/18  1:48 PM  Result Value Ref Range   Glucose-Capillary 176 (H) 65 - 99 mg/dL  Glucose, capillary     Status: Abnormal   Collection Time: 02/25/18  4:37 PM  Result Value Ref Range   Glucose-Capillary 152 (H) 65 - 99 mg/dL  Glucose, capillary     Status: Abnormal   Collection Time: 02/25/18  7:35 PM  Result Value Ref Range   Glucose-Capillary 120 (H) 65 - 99 mg/dL  Glucose, capillary     Status: Abnormal   Collection Time: 02/25/18 11:11 PM  Result Value Ref Range  Glucose-Capillary 159 (H) 65 - 99 mg/dL  Comprehensive metabolic panel     Status: Abnormal   Collection Time: 02/26/18  2:10 AM  Result Value Ref Range   Sodium 133 (L) 135 - 145 mmol/L   Potassium 3.7 3.5 - 5.1 mmol/L   Chloride 100 (L) 101 - 111 mmol/L   CO2 26 22 - 32 mmol/L   Glucose, Bld 142 (H) 65 - 99 mg/dL   BUN 23 (H) 6 - 20 mg/dL   Creatinine, Ser 1.38 (H) 0.61 - 1.24 mg/dL   Calcium 8.6 (L) 8.9 - 10.3 mg/dL   Total Protein 5.1 (L) 6.5 - 8.1 g/dL   Albumin 2.9 (L) 3.5 - 5.0 g/dL   AST 16 15 - 41 U/L   ALT 22 17 - 63 U/L   Alkaline Phosphatase 62 38 - 126 U/L   Total Bilirubin 1.2 0.3 - 1.2 mg/dL   GFR calc non Af Amer 49 (L) >60 mL/min   GFR calc Af Amer 57 (L) >60 mL/min   Anion gap 7 5 - 15  CBC     Status: Abnormal   Collection Time: 02/26/18  2:10 AM  Result Value Ref Range   WBC 11.2 (H) 4.0 -  10.5 K/uL   RBC 3.09 (L) 4.22 - 5.81 MIL/uL   Hemoglobin 9.4 (L) 13.0 - 17.0 g/dL   HCT 28.7 (L) 39.0 - 52.0 %   MCV 92.9 78.0 - 100.0 fL   MCH 30.4 26.0 - 34.0 pg   MCHC 32.8 30.0 - 36.0 g/dL   RDW 15.4 11.5 - 15.5 %   Platelets 116 (L) 150 - 400 K/uL  Glucose, capillary     Status: Abnormal   Collection Time: 02/26/18  3:42 AM  Result Value Ref Range   Glucose-Capillary 133 (H) 65 - 99 mg/dL  Glucose, capillary     Status: Abnormal   Collection Time: 02/26/18  7:51 AM  Result Value Ref Range   Glucose-Capillary 123 (H) 65 - 99 mg/dL   No results found.   Assessment/Plan: Diagnosis: debility after complications related to pancreatic cancer, multiple surgeries, recent TAVR 1. Does the need for close, 24 hr/day medical supervision in concert with the patient's rehab needs make it unreasonable for this patient to be served in a less intensive setting? Yes 2. Co-Morbidities requiring supervision/potential complications: HTN, post-op considerations 3. Due to bladder management, bowel management, safety, skin/wound care, disease management, medication administration, pain management and patient education, does the patient require 24 hr/day rehab nursing? Yes 4. Does the patient require coordinated care of a physician, rehab nurse, PT (1-2 hrs/day, 5 days/week) and OT (1-2 hrs/day, 5 days/week) to address physical and functional deficits in the context of the above medical diagnosis(es)? Yes Addressing deficits in the following areas: balance, endurance, locomotion, strength, transferring, bowel/bladder control, bathing, dressing, feeding, grooming, toileting and psychosocial support 5. Can the patient actively participate in an intensive therapy program of at least 3 hrs of therapy per day at least 5 days per week? Yes 6. The potential for patient to make measurable gains while on inpatient rehab is excellent 7. Anticipated functional outcomes upon discharge from inpatient rehab are modified  independent and supervision  with PT, modified independent and supervision with OT, modified independent and supervision with SLP. 8. Estimated rehab length of stay to reach the above functional goals is: 8-12 days 9. Anticipated D/C setting: Home 10. Anticipated post D/C treatments: HH therapy and Outpatient therapy 11. Overall Rehab/Functional Prognosis: excellent  RECOMMENDATIONS: This patient's condition is appropriate for continued rehabilitative care in the following setting: CIR Patient has agreed to participate in recommended program. Yes Note that insurance prior authorization may be required for reimbursement for recommended care.  Comment: Rehab Admissions Coordinator to follow up.  Thanks,  Meredith Staggers, MD, Mellody Drown     Lavon Paganini Angiulli, PA-C 02/26/2018

## 2018-02-26 NOTE — Progress Notes (Signed)
Rehab Admissions Coordinator Note:  Patient was screened by Cleatrice Burke for appropriateness for an Inpatient Acute Rehab Consult per PT and OT recommendation.s   At this time, we are recommending Inpatient Rehab consult if pt wold like to be considered for admit. Please advise   Danne Baxter, RN, MSN Rehab Admissions Coordinator [phone] 02/26/2018 8:37 AM

## 2018-02-26 NOTE — Discharge Instructions (Signed)
[facility]      [facility] °[phone] ° °ABDOMINAL SURGERY: POST OP INSTRUCTIONS ° °Always review your discharge instruction sheet given to you by the facility where your surgery was performed. ° °IF YOU HAVE DISABILITY OR FAMILY LEAVE FORMS, YOU MUST BRING THEM TO THE OFFICE FOR PROCESSING.  PLEASE DO NOT GIVE THEM TO YOUR DOCTOR. ° °1. A prescription for pain medication may be given to you upon discharge.  Take your pain medication as prescribed, if needed.  If narcotic pain medicine is not needed, then you may take acetaminophen (Tylenol) or ibuprofen (Advil) as needed. °2. Take your usually prescribed medications unless otherwise directed. °3. If you need a refill on your pain medication, please contact your pharmacy. They will contact our office to request authorization.  Prescriptions will not be filled after 5pm or on week-ends. °4. You should follow a light diet the first few days after arrival home, such as soup and crackers, pudding, etc.unless your doctor has advised otherwise. A high-fiber, low fat diet can be resumed as tolerated.   Be sure to include lots of fluids daily. Most patients will experience some swelling and bruising on the chest and neck area.  Ice packs will help.  Swelling and bruising can take several days to resolve °5. Most patients will experience some swelling and bruising in the area of the incision. Ice pack will help. Swelling and bruising can take several days to resolve..  °6. It is common to experience some constipation if taking pain medication after surgery.  Increasing fluid intake and taking a stool softener will usually help or prevent this problem from occurring.  A mild laxative (Milk of Magnesia or Miralax) should be taken according to package directions if there are no bowel movements after 48 hours. °7.  You may have steri-strips (small skin tapes) in place directly over the incision.  These strips should be left on the skin for 10-14 days.  If your  surgeon used skin glue on the incision, you may shower in 48 hours.  The glue will flake off over the next 2-3 weeks.  Any sutures or staples will be removed at the office during your follow-up visit. You may find that a light gauze bandage over your incision may keep your staples from being rubbed or pulled. You may shower and replace the bandage daily. °8. ACTIVITIES:  You may resume regular (light) daily activities beginning the next day--such as daily self-care, walking, climbing stairs--gradually increasing activities as tolerated.  You may have sexual intercourse when it is comfortable.  Refrain from any heavy lifting or straining until approved by your doctor. °a. You may drive when you no longer are taking prescription pain medication, you can comfortably wear a seatbelt, and you can safely maneuver your car and apply brakes °b. Return to Work: __________8 weeks if applicable_________________________ °9. You should see your doctor in the office for a follow-up appointment approximately two weeks after your surgery.  Make sure that you call for this appointment within a day or two after you arrive home to insure a convenient appointment time. °OTHER INSTRUCTIONS:  °_____________________________________________________________ °_____________________________________________________________ ° °WHEN TO CALL YOUR DOCTOR: °1. Fever over 101.0 °2. Inability to urinate °3. Nausea and/or vomiting °4. Extreme swelling or bruising °5. Continued bleeding from incision. °6. Increased pain, redness, or drainage from the incision. °7. Difficulty swallowing or breathing °8. Muscle cramping or spasms. °9. Numbness or tingling in hands or feet or around lips. ° °The clinic staff is   available to answer your questions during regular business hours.  Please don’t hesitate to call and ask to speak to one of the nurses if you have concerns. ° °For further questions, please visit [email or web address] ° ° ° °

## 2018-02-26 NOTE — Progress Notes (Signed)
Physical Therapy Treatment Patient Details Name: Manuel Olson MRN: [id number] DOB: Nov 29, 1945 Today's Date: 02/26/2018    History of Present Illness 72 y.o. male admitted on 02/19/18 for nephrectomy due to L nephrectomy and diagnostic laparoscopy, classic pancreaticoduodenectomy with portal vein resection, and placement of biliar and pancreatic duct stents (whipple).  Pt with other significant PMH of recent TAVR procedure (08/2017), stroke, DM2, pancreatic CA, MI, essential HTN, CAD, L shoulder arthroscopy with RTC repair, bil hand surgery, L eye surgery, HOH bil ears with hearing aids.       PT Comments    Pt progressing well. Appears to be less confused and more with it. Pt with increased ambulation tolerance however requires RW for safe amb. Pt was indep PTA now requires RW for safe mobility. Pt to benefit from short stay at CIR to achieve indep function to decrease burden of care of on spouse and decrease falls risk as pt was indep PTA.   Follow Up Recommendations  CIR;Supervision for mobility/OOB     Equipment Recommendations  Rolling walker with 5" wheels;3in1 (PT)    Recommendations for Other Services       Precautions / Restrictions Precautions Precautions: Fall;Other (comment) Precaution Comments: abdomen Restrictions Weight Bearing Restrictions: No    Mobility  Bed Mobility               General bed mobility comments: pt oob in chair upon PT arrival  Transfers Overall transfer level: Needs assistance Equipment used: Rolling walker (2 wheeled) Transfers: Sit to/from Stand Sit to Stand: Min guard         General transfer comment: no physical assist but min guard for safety due to impulsivity  Ambulation/Gait Ambulation/Gait assistance: Min assist Gait Distance (Feet): 500 Feet Assistive device: Rolling walker (2 wheeled) Gait Pattern/deviations: Step-through pattern;Shuffle;Trunk flexed Gait velocity: slow Gait velocity interpretation: <1.31  ft/sec, indicative of household ambulator General Gait Details: max directional v/c's to slow down, minA for safe walker management, tactile and directional cues to stand up straight, attempted to ambulate without RW, pt very unsteady requiring min/modA to maintain balance to prevent fall   Stairs             Wheelchair Mobility    Modified Rankin (Stroke Patients Only)       Balance Overall balance assessment: Needs assistance Sitting-balance support: No upper extremity supported;Feet supported Sitting balance-Leahy Scale: Good Sitting balance - Comments: pt able to done socks without difficulty in chair   Standing balance support: No upper extremity supported Standing balance-Leahy Scale: Fair                              Cognition Arousal/Alertness: Awake/alert Behavior During Therapy: Impulsive Overall Cognitive Status: Within Functional Limits for tasks assessed                                 General Comments: pt is HOH but appears to be alert and oriented. pt impulsive but does listen and follow commands      Exercises      General Comments General comments (skin integrity, edema, etc.): incision with dressing      Pertinent Vitals/Pain Pain Assessment: 0-10 Pain Score: 5  Pain Location: abdomen and chronic low back Pain Descriptors / Indicators: Aching;Grimacing;Guarding Pain Intervention(s): Monitored during session    Home Living  Prior Function            PT Goals (current goals can now be found in the care plan section) Acute Rehab PT Goals Patient Stated Goal: go home Progress towards PT goals: Progressing toward goals    Frequency    Min 3X/week      PT Plan Current plan remains appropriate    Co-evaluation              AM-PAC PT "6 Clicks" Daily Activity  Outcome Measure  Difficulty turning over in bed (including adjusting bedclothes, sheets and blankets)?: A  Little Difficulty moving from lying on back to sitting on the side of the bed? : A Little Difficulty sitting down on and standing up from a chair with arms (e.g., wheelchair, bedside commode, etc,.)?: A Little Help needed moving to and from a bed to chair (including a wheelchair)?: A Little Help needed walking in hospital room?: A Little Help needed climbing 3-5 steps with a railing? : A Lot 6 Click Score: 17    End of Session   Activity Tolerance: Patient limited by fatigue Patient left: in chair;with call bell/phone within reach;with chair alarm set Nurse Communication: Mobility status PT Visit Diagnosis: Muscle weakness (generalized) (M62.81);Difficulty in walking, not elsewhere classified (R26.2);Pain     Time: 1221-1240 PT Time Calculation (min) (ACUTE ONLY): 19 min  Charges:  $Gait Training: 8-22 mins                    G Codes:       Kittie Plater, PT, DPT Pager #: [phone] Office #: [phone]    [name] 02/26/2018, 1:53 PM

## 2018-02-26 NOTE — Progress Notes (Signed)
Occupational Therapy Treatment Patient Details Name: Manuel Olson MRN: [id number] DOB: [birth year] Today's Date: 02/26/2018    History of present illness 72 y.o. male admitted on 02/19/18 for nephrectomy due to L nephrectomy and diagnostic laparoscopy, classic pancreaticoduodenectomy with portal vein resection, and placement of biliar and pancreatic duct stents (whipple).  Pt with other significant PMH of recent TAVR procedure (08/2017), stroke, DM2, pancreatic CA, MI, essential HTN, CAD, L shoulder arthroscopy with RTC repair, bil hand surgery, L eye surgery, HOH bil ears with hearing aids.      OT comments  Pt presents supine in bed, reports feeling fatigued but is agreeable to participate in OT tx session. Pt having completing ADLs prior to session, focus of session on UB/LB exercise and sit<>stands for further strengthening and increasing endurance. Pt completing UB/LB exercises across multiple planes seated EOB and multiple sit<>stands at RW with overall minguard assist for sit<>stand, supervision for sitting balance EOB. Pt motivated to work with therapist and progress to PLOF. Continue to recommend CIR level services at time of discharge. Will continue to follow acutely to progress pt towards established OT goals.   Follow Up Recommendations  Supervision/Assistance - 24 hour;CIR    Equipment Recommendations  3 in 1 bedside commode;Other (comment)(RW; to be further assessed in next venue )          Precautions / Restrictions Precautions Precautions: Fall;Other (comment) Precaution Comments: abdomen Restrictions Weight Bearing Restrictions: No       Mobility Bed Mobility Overal bed mobility: Needs Assistance Bed Mobility: Sit to Supine;Rolling;Sidelying to Sit Rolling: Min assist Sidelying to sit: Min assist   Sit to supine: Supervision   General bed mobility comments: verbal cues for technique and assist to elevate trunk into sitting; supervision when returning to  supine   Transfers Overall transfer level: Needs assistance Equipment used: Rolling walker (2 wheeled) Transfers: Sit to/from Stand Sit to Stand: Min guard         General transfer comment: no physical assist but min guard for safety     Balance Overall balance assessment: Needs assistance Sitting-balance support: No upper extremity supported;Feet supported Sitting balance-Leahy Scale: Good Sitting balance - Comments: pt able to done socks without difficulty in chair   Standing balance support: No upper extremity supported Standing balance-Leahy Scale: Fair                             ADL either performed or assessed with clinical judgement   ADL Overall ADL's : Needs assistance/impaired                                     Functional mobility during ADLs: Minimal assistance;Rolling walker;Cueing for safety;Cueing for sequencing General ADL Comments: wife present and engaged in session; pt reports having already completed ADLs, engaged in bil UE/LE exercises and sit<>stands for increased strengthening and endurance      Vision       Perception     Praxis      Cognition Arousal/Alertness: Awake/alert Behavior During Therapy: Impulsive Overall Cognitive Status: Impaired/Different from baseline Area of Impairment: Awareness                       Following Commands: Follows one step commands with increased time;Follows one step commands consistently   Awareness: Emergent Problem Solving: Requires tactile cues;Requires verbal cues General Comments:  pt continues to remain impulsive, but demonstrates improvements overall with cognition, follows commands consistently with cues provided intermittently         Exercises General Exercises - Upper Extremity Shoulder Flexion: AROM;Both;10 reps;Seated;Other (comment)(with therapist providing resistance) Shoulder Extension: AROM;Both;10 reps;Seated(with therapist providing  resistance) Shoulder Horizontal ABduction: AROM;10 reps;Both;Seated Shoulder Horizontal ADduction: AROM;10 reps;Both;Seated(with therapist providing resistance) General Exercises - Lower Extremity Long Arc Quad: AROM;10 reps;Both;Seated(with therapist providing resistance) Other Exercises Other Exercises: sit<>stand from EOB x10 reps    Shoulder Instructions       General Comments incision with dressing    Pertinent Vitals/ Pain       Pain Assessment: Faces Pain Score: 5  Faces Pain Scale: Hurts little more Pain Location: abdomen and chronic low back Pain Descriptors / Indicators: Aching;Grimacing;Guarding Pain Intervention(s): Monitored during session  Home Living                                          Prior Functioning/Environment              Frequency  Min 2X/week        Progress Toward Goals  OT Goals(current goals can now be found in the care plan section)  Progress towards OT goals: Progressing toward goals  Acute Rehab OT Goals Patient Stated Goal: go home OT Goal Formulation: With patient/family Time For Goal Achievement: 03/11/18 Potential to Achieve Goals: Good  Plan Discharge plan remains appropriate    Co-evaluation                 AM-PAC PT "6 Clicks" Daily Activity     Outcome Measure   Help from another person eating meals?: None Help from another person taking care of personal grooming?: A Little Help from another person toileting, which includes using toliet, bedpan, or urinal?: A Little Help from another person bathing (including washing, rinsing, drying)?: A Little Help from another person to put on and taking off regular upper body clothing?: A Little Help from another person to put on and taking off regular lower body clothing?: A Little 6 Click Score: 19    End of Session Equipment Utilized During Treatment: Rolling walker  OT Visit Diagnosis: Other abnormalities of gait and mobility (R26.89);Muscle  weakness (generalized) (M62.81);Other symptoms and signs involving cognitive function;Pain Pain - part of body: (abdomen, low back )   Activity Tolerance Patient tolerated treatment well   Patient Left in bed;with call bell/phone within reach;with family/visitor present   Nurse Communication Mobility status        Time: 1287-8676 OT Time Calculation (min): 25 min  Charges: OT General Charges $OT Visit: 1 Visit OT Treatments $Therapeutic Activity: 23-37 mins  Lou Cal, [place] Pager [phone] 02/26/2018    Raymondo Band 02/26/2018, 4:49 PM

## 2018-02-26 NOTE — Discharge Summary (Signed)
Physician Discharge Summary  Patient ID: Manuel Olson MRN: [id number] DOB/AGE: [birth year] 72 y.o.  Admit date: 02/19/2018 Discharge date: 02/26/2018  Admission Diagnoses: Patient Active Problem List   Diagnosis Date Noted  . S/P laparoscopy 02/19/2018  . 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 Port-A-Cath in place 10/10/2017  . Renal cell carcinoma of right kidney ([place]) 10/02/2017  . Malnutrition of moderate degree 09/28/2017  . Anemia due to antineoplastic chemotherapy 09/28/2017  . Diffuse abdominal pain 09/28/2017  . Ileitis, terminal ([place]) 09/28/2017  . Hyponatremia 09/27/2017  . Dehydration 09/26/2017  . FTT (failure to thrive) in adult 09/26/2017  . Chemotherapy-induced nausea   . Hypokalemia   . Hypomagnesemia   . Poorly controlled type 2 diabetes mellitus with circulatory disorder ([place]) 08/25/2017  . Mixed hyperlipidemia 08/25/2017  . Essential hypertension, benign   . Pancreatic cancer ([place])   . Renal mass   . CAD (coronary artery disease)   . Former smoker   . S/P TAVR (transcatheter aortic valve replacement) 08/22/2017  . Cancer of head of pancreas ([place]) 08/10/2017  . Severe aortic stenosis 07/05/2017    Discharge Diagnoses:  Active Problems:   S/P laparoscopy   Adenocarcinoma of head of pancreas ([place]) and same as above  Discharged Condition: stable  Hospital Course:  Pt was admitted to the ICU after Whipple for adenocarcinoma of the pancreatic head and left nephrectomy for renal cell cancer.  He had an epidural initially for pain control which worked very well.  He had hypotension on POD 0-1.  He was given albumin and saline boluses which resolved issue.  He then became hypertensive which is his baseline.  He was started on IV beta blockers.  He was started back on his Lantus which required ramping up.  His epidural clotted off, so it was removed.  He developed thrombocytopenia.  He had NOT received any heparin products, but was on IV  famotidine.  That was stopped and his platelet count rebounded.  His NGT was removed on POD 2 and he was placed on clear liquids.  He was kept liberally on IV fluids due to the 1 kidney. He was able to tolerate diet advance and transition to oral pain meds.  He had trouble ambulating, which is not surprising given the whirlwind nature of the last 5 months with a TAVR, chemo, and now a major abdominal operation.  Rehab recommended.    Consults: seen in conjunction with urology  Significant Diagnostic Studies: labs: WBCs 11.2, HCT 28.7, Plts 116, Cr 1.38  Treatments: surgery: see above  Discharge Exam: Blood pressure (!) 150/87, pulse 80, temperature 98.8 F (37.1 C), resp. rate 16, weight 73.9 kg (162 lb 14.7 oz), SpO2 95 %. General appearance: alert, cooperative and no distress Resp: breathing comfortably Cardio: regular rate and rhythm GI: soft, non distended, wound c/d/i.  no drainage. Extremities: extremities normal, atraumatic, no cyanosis or edema  Gait- unsteady.  Disposition: Discharge disposition: [place] Not Defined       Discharge Instructions    Call MD for:  difficulty breathing, headache or visual disturbances   Complete by:  As directed    Call MD for:  persistant nausea and vomiting   Complete by:  As directed    Call MD for:  redness, tenderness, or signs of infection (pain, swelling, redness, odor or green/yellow discharge around incision site)   Complete by:  As directed    Call MD for:  severe uncontrolled pain  Complete by:  As directed    Call MD for:  temperature >100.4   Complete by:  As directed    Change dressing (specify)   Complete by:  As directed    [place] for dry dressing as needed to abdominal drain sites and midline incision.  Leave open to air unless draining.  Remove staples 03/02/18, replace with benzoin and 1/2 inch steristrips.   Discharge diet:   Complete by:  As directed    Carb mod or soft/bland   Increase  activity slowly   Complete by:  As directed    No dressing needed   Complete by:  As directed      Allergies as of 02/26/2018   No Known Allergies     Medication List    STOP taking these medications   aspirin EC 81 MG tablet   diphenoxylate-atropine 2.5-0.025 MG tablet Commonly known as:  LOMOTIL   ondansetron 8 MG tablet Commonly known as:  ZOFRAN     TAKE these medications   acetaminophen 325 MG tablet Commonly known as:  TYLENOL Take 2 tablets (650 mg total) by mouth every 6 (six) hours as needed for mild pain, fever or headache.   atorvastatin 20 MG tablet Commonly known as:  LIPITOR Take 1 tablet (20 mg total) by mouth daily.   baclofen 10 MG tablet Commonly known as:  LIORESAL Take 0.5 tablets (5 mg total) by mouth 3 (three) times daily as needed for muscle spasms. What changed:  Another medication with the same name was added. Make sure you understand how and when to take each.   baclofen 10 MG tablet Commonly known as:  LIORESAL Take 1 tablet (10 mg total) by mouth 3 (three) times daily as needed for muscle spasms. What changed:  You were already taking a medication with the same name, and this prescription was added. Make sure you understand how and when to take each.   BASAGLAR KWIKPEN 100 UNIT/ML Sopn Inject 0.2 mLs (20 Units total) into the skin at bedtime.   carvedilol 3.125 MG tablet Commonly known as:  COREG TAKE (1) TABLET BY MOUTH TWICE A DAY WITH MEALS (BREAKFAST AND SUPPER)   clopidogrel 75 MG tablet Commonly known as:  PLAVIX Take 1 tablet (75 mg total) by mouth daily. What changed:  when to take this   feeding supplement (ENSURE ENLIVE) Liqd Take 237 mLs by mouth 2 (two) times daily between meals.   gabapentin 100 MG capsule Commonly known as:  NEURONTIN Take 2 capsules (200 mg total) by mouth 2 (two) times daily.   insulin lispro 100 UNIT/ML injection Commonly known as:  HUMALOG Inject 0.04-0.06 mLs (4-6 Units total) into the skin 3  (three) times daily before meals. Pens. What changed:    how much to take  when to take this  additional instructions   Insulin Pen Needle 32G X 4 MM Misc Commonly known as:  CAREFINE PEN NEEDLES Use 1x a day   lidocaine-prilocaine cream Commonly known as:  EMLA Apply 1 application topically as needed. Apply small amount of cream over port site 1-2 hrs prior to chemo, cover with plastic wrap. What changed:    reasons to take this  additional instructions   lisinopril 10 MG tablet Commonly known as:  PRINIVIL,ZESTRIL Take 1 tablet (10 mg total) by mouth daily.   ondansetron 4 MG disintegrating tablet Commonly known as:  ZOFRAN-ODT Take 1 tablet (4 mg total) by mouth every 6 (six) hours as needed for nausea.  oxyCODONE 5 MG immediate release tablet Commonly known as:  Oxy IR/ROXICODONE Take 1 tablet (5 mg total) by mouth every 4 (four) hours as needed for moderate pain, severe pain or breakthrough pain.   pantoprazole 40 MG tablet Commonly known as:  PROTONIX Take 1 tablet (40 mg total) by mouth daily at 12 noon. Start taking on:  02/27/2018   prochlorperazine 5 MG tablet Commonly known as:  COMPAZINE Take 1 tablet (5 mg total) by mouth every 6 (six) hours as needed for nausea or vomiting.            Discharge Care Instructions  (From admission, onward)        Start     Ordered   02/26/18 0000  Change dressing (specify)    Comments:  Ok for dry dressing as needed to abdominal drain sites and midline incision.  Leave open to air unless draining.  Remove staples 03/02/18, replace with benzoin and 1/2 inch steristrips.   02/26/18 1246     Follow-up Information    [name], MD Follow up in 2 week(s).   Specialty:  General Surgery Contact information: [address] [place] [address] [phone]           Signed: [name] 02/26/2018, 1:01 PM

## 2018-02-26 NOTE — Progress Notes (Signed)
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 Barry Dienes and Dr. Alyson Ingles, 06/10 - Urology following  FEN: soft diet/carb mod VTE: SCD's, lovenox ID: no abx currently Foley: discontinued per urology 06/14 Follow up: Dr. Barry Dienes and Urology  DISPO: rehab consult pending.      LOS: 7 days    Subjective: CC: "sick and tired of being here."  Tolerating oral pain meds sparingly.  No n/v.    Objective: Vital signs in last 24 hours: Temp:  [97.8 F (36.6 C)-98.8 F (37.1 C)] 98.8 F (37.1 C) (06/17 0844) Pulse Rate:  [73-80] 80 (06/17 0300) Resp:  [10-16] 16 (06/17 0300) BP: (127-165)/(51-87) 150/87 (06/17 0300) SpO2:  [95 %-97 %] 95 % (06/17 0300) Last BM Date: 02/24/18  Intake/Output from previous day: 06/16 0701 - 06/17 0700 In: 1665.8 [P.O.:720; I.V.:945.8] Out: 625 [Urine:625] Intake/Output this shift: Total I/O In: 240 [P.O.:240] Out: 150 [Urine:150]  PE: Gen:  Alert, NAD, pleasant, cooperative Card:  RRR, no M/G/R heard Pulm:  CTA, no W/R/R, effort normal Abd: Soft, ND, +BS, incision with staples intact is without signs of infection or drainage. Mild generalized TTP without guarding. No peritonitis.  Skin: no rashes noted, warm and dry   Anti-infectives: Anti-infectives (From admission, onward)   Start     Dose/Rate Route Frequency Ordered Stop   02/19/18 1800  Ampicillin-Sulbactam (UNASYN) 3 g in sodium chloride 0.9 % 100 mL IVPB     3 g 200 mL/hr over 30 Minutes Intravenous Every 6 hours 02/19/18 1634 02/19/18 1754   02/19/18 0630  ceFAZolin (ANCEF) IVPB 2g/100 mL premix     2 g 200 mL/hr over 30 Minutes Intravenous On call to O.R. 02/19/18 0625 02/19/18 1215   02/19/18 0628  ceFAZolin (ANCEF) 2-4 GM/100ML-% IVPB    Note to Pharmacy:  [name]   : cabinet override   02/19/18 0628 02/19/18 0815      Lab Results:  Recent Labs    02/25/18 1029 02/26/18 0210  WBC 9.5 11.2*  HGB 9.4* 9.4*  HCT 28.4* 28.7*  PLT 97* 116*   BMET Recent Labs    02/24/18 0951 02/26/18 0210  NA 133* 133*  K 3.9 3.7  CL 100* 100*  CO2 25 26  GLUCOSE 122* 142*  BUN 25* 23*  CREATININE 1.26* 1.38*  CALCIUM 8.6* 8.6*   PT/INR No results for input(s): LABPROT, INR in the last 72 hours. CMP     Component Value Date/Time   NA 133 (L) 02/26/2018 0210   NA 139 09/07/2017 0844   K 3.7 02/26/2018 0210   K 5.4 No visable hemolysis (H) 09/07/2017 0844   CL 100 (L) 02/26/2018 0210   CO2 26 02/26/2018 0210   CO2 27 09/07/2017 0844   GLUCOSE 142 (H) 02/26/2018 0210   GLUCOSE 227 (H) 09/07/2017 0844   BUN 23 (H) 02/26/2018 0210   BUN 29.8 (H) 09/07/2017 0844   CREATININE 1.38 (H) 02/26/2018 0210   CREATININE 0.76 12/13/2017 1100   CREATININE 1.2 09/07/2017 0844   CALCIUM 8.6 (L) 02/26/2018 0210   CALCIUM 9.8 09/07/2017 0844   PROT 5.1 (L) 02/26/2018 0210   PROT 6.9 09/07/2017 0844   ALBUMIN 2.9 (L) 02/26/2018 0210   ALBUMIN 3.9 09/07/2017 0844   AST 16 02/26/2018 0210   AST 16 12/13/2017 1100   AST 13 09/07/2017 0844  ALT 22 02/26/2018 0210   ALT 13 12/13/2017 1100   ALT 12 09/07/2017 0844   ALKPHOS 62 02/26/2018 0210   ALKPHOS 89 09/07/2017 0844   BILITOT 1.2 02/26/2018 0210   BILITOT 0.2 12/13/2017 1100   BILITOT 0.37 09/07/2017 0844   GFRNONAA 49 (L) 02/26/2018 0210   GFRNONAA >60 12/13/2017 1100   GFRAA 57 (L) 02/26/2018 0210   GFRAA >60 12/13/2017 1100   Lipase     Component Value Date/Time   LIPASE 19 09/29/2017 1114    Studies/Results: No results found.    [name] , [facility] 02/26/2018, 1:02 PM

## 2018-02-27 ENCOUNTER — Inpatient Hospital Stay (HOSPITAL_COMMUNITY)
Admission: RE | Admit: 2018-02-27 | Discharge: 2018-03-03 | DRG: 945 | Disposition: A | Discharge status: Home / Self Care | Payer: Medicare Other | Source: Intra-hospital | Attending: Physical Medicine & Rehabilitation | Admitting: Physical Medicine & Rehabilitation

## 2018-02-27 ENCOUNTER — Encounter (HOSPITAL_COMMUNITY): Payer: Self-pay

## 2018-02-27 ENCOUNTER — Other Ambulatory Visit: Payer: Self-pay

## 2018-02-27 DIAGNOSIS — E1121 Type 2 diabetes mellitus with diabetic nephropathy: Secondary | ICD-10-CM | POA: Diagnosis not present

## 2018-02-27 DIAGNOSIS — Z955 Presence of coronary angioplasty implant and graft: Secondary | ICD-10-CM | POA: Diagnosis not present

## 2018-02-27 DIAGNOSIS — Z8673 Personal history of transient ischemic attack (TIA), and cerebral infarction without residual deficits: Secondary | ICD-10-CM | POA: Diagnosis not present

## 2018-02-27 DIAGNOSIS — R5381 Other malaise: Principal | ICD-10-CM | POA: Diagnosis present

## 2018-02-27 DIAGNOSIS — I2510 Atherosclerotic heart disease of native coronary artery without angina pectoris: Secondary | ICD-10-CM | POA: Diagnosis present

## 2018-02-27 DIAGNOSIS — E785 Hyperlipidemia, unspecified: Secondary | ICD-10-CM | POA: Diagnosis present

## 2018-02-27 DIAGNOSIS — K219 Gastro-esophageal reflux disease without esophagitis: Secondary | ICD-10-CM | POA: Diagnosis present

## 2018-02-27 DIAGNOSIS — Z7982 Long term (current) use of aspirin: Secondary | ICD-10-CM

## 2018-02-27 DIAGNOSIS — Z794 Long term (current) use of insulin: Secondary | ICD-10-CM | POA: Diagnosis not present

## 2018-02-27 DIAGNOSIS — I10 Essential (primary) hypertension: Secondary | ICD-10-CM | POA: Diagnosis present

## 2018-02-27 DIAGNOSIS — C259 Malignant neoplasm of pancreas, unspecified: Secondary | ICD-10-CM | POA: Diagnosis present

## 2018-02-27 DIAGNOSIS — E119 Type 2 diabetes mellitus without complications: Secondary | ICD-10-CM

## 2018-02-27 DIAGNOSIS — Z8249 Family history of ischemic heart disease and other diseases of the circulatory system: Secondary | ICD-10-CM

## 2018-02-27 DIAGNOSIS — I252 Old myocardial infarction: Secondary | ICD-10-CM

## 2018-02-27 DIAGNOSIS — Z7902 Long term (current) use of antithrombotics/antiplatelets: Secondary | ICD-10-CM

## 2018-02-27 DIAGNOSIS — Z79899 Other long term (current) drug therapy: Secondary | ICD-10-CM

## 2018-02-27 DIAGNOSIS — Z8507 Personal history of malignant neoplasm of pancreas: Secondary | ICD-10-CM | POA: Diagnosis not present

## 2018-02-27 DIAGNOSIS — E11649 Type 2 diabetes mellitus with hypoglycemia without coma: Secondary | ICD-10-CM | POA: Diagnosis not present

## 2018-02-27 DIAGNOSIS — D62 Acute posthemorrhagic anemia: Secondary | ICD-10-CM | POA: Diagnosis not present

## 2018-02-27 DIAGNOSIS — Z905 Acquired absence of kidney: Secondary | ICD-10-CM

## 2018-02-27 DIAGNOSIS — Z952 Presence of prosthetic heart valve: Secondary | ICD-10-CM | POA: Diagnosis not present

## 2018-02-27 DIAGNOSIS — Z87891 Personal history of nicotine dependence: Secondary | ICD-10-CM

## 2018-02-27 LAB — GLUCOSE, CAPILLARY
Glucose-Capillary: 95 mg/dL (ref 65–99)
Glucose-Capillary: 92 mg/dL (ref 65–99)
Glucose-Capillary: 142 mg/dL — ABNORMAL HIGH (ref 65–99)
Glucose-Capillary: 168 mg/dL — ABNORMAL HIGH (ref 65–99)
Glucose-Capillary: 156 mg/dL — ABNORMAL HIGH (ref 65–99)
Glucose-Capillary: 134 mg/dL — ABNORMAL HIGH (ref 65–99)

## 2018-02-27 MED ORDER — OXYCODONE HCL 5 MG PO TABS
5.0000 mg | ORAL_TABLET | ORAL | Status: DC | PRN
  Administered 2018-02-28: 5 mg via ORAL
  Administered 2018-02-28 – 2018-03-01 (×3): 10 mg via ORAL
  Filled 2018-02-27 (×3): qty 2
  Filled 2018-02-27: qty 1

## 2018-02-27 MED ORDER — LISINOPRIL 10 MG PO TABS
10.0000 mg | ORAL_TABLET | Freq: Every day | ORAL | Status: DC
  Administered 2018-02-28 – 2018-03-02 (×3): 10 mg via ORAL
  Filled 2018-02-27 (×4): qty 1

## 2018-02-27 MED ORDER — ATORVASTATIN CALCIUM 20 MG PO TABS
20.0000 mg | ORAL_TABLET | Freq: Every day | ORAL | Status: DC
  Administered 2018-02-28 – 2018-03-03 (×4): 20 mg via ORAL
  Filled 2018-02-27 (×4): qty 1

## 2018-02-27 MED ORDER — INSULIN GLARGINE 100 UNIT/ML ~~LOC~~ SOLN
22.0000 [IU] | Freq: Every day | SUBCUTANEOUS | Status: DC
  Administered 2018-02-27 – 2018-03-01 (×3): 22 [IU] via SUBCUTANEOUS
  Filled 2018-02-27 (×4): qty 0.22

## 2018-02-27 MED ORDER — ONDANSETRON 4 MG PO TBDP: 4.0000 mg | ORAL_TABLET | Freq: Four times a day (QID) | ORAL | Status: DC | PRN

## 2018-02-27 MED ORDER — ONDANSETRON HCL 4 MG/2ML IJ SOLN: 4.0000 mg | Freq: Four times a day (QID) | INTRAMUSCULAR | Status: DC | PRN

## 2018-02-27 MED ORDER — CARVEDILOL 3.125 MG PO TABS
3.1250 mg | ORAL_TABLET | Freq: Two times a day (BID) | ORAL | Status: DC
  Administered 2018-02-28 – 2018-03-03 (×7): 3.125 mg via ORAL
  Filled 2018-02-27 (×7): qty 1

## 2018-02-27 MED ORDER — INSULIN ASPART 100 UNIT/ML ~~LOC~~ SOLN
0.0000 [IU] | SUBCUTANEOUS | Status: DC
  Administered 2018-02-27: 1 [IU] via SUBCUTANEOUS
  Administered 2018-02-28: 2 [IU] via SUBCUTANEOUS
  Administered 2018-03-01 – 2018-03-03 (×4): 1 [IU] via SUBCUTANEOUS

## 2018-02-27 MED ORDER — ENSURE ENLIVE PO LIQD
237.0000 mL | Freq: Two times a day (BID) | ORAL | Status: DC
  Administered 2018-02-28 – 2018-03-03 (×7): 237 mL via ORAL

## 2018-02-27 MED ORDER — GABAPENTIN 100 MG PO CAPS
200.0000 mg | ORAL_CAPSULE | Freq: Two times a day (BID) | ORAL | Status: DC
  Administered 2018-02-27 – 2018-03-03 (×8): 200 mg via ORAL
  Filled 2018-02-27 (×8): qty 2

## 2018-02-27 MED ORDER — BACLOFEN 10 MG PO TABS: 10.0000 mg | ORAL_TABLET | Freq: Three times a day (TID) | ORAL | Status: DC | PRN

## 2018-02-27 MED ORDER — ENOXAPARIN SODIUM 30 MG/0.3ML ~~LOC~~ SOLN
30.0000 mg | Freq: Two times a day (BID) | SUBCUTANEOUS | Status: DC
  Administered 2018-02-27 – 2018-03-02 (×7): 30 mg via SUBCUTANEOUS
  Filled 2018-02-27 (×7): qty 0.3

## 2018-02-27 MED ORDER — ACETAMINOPHEN 325 MG PO TABS
650.0000 mg | ORAL_TABLET | Freq: Four times a day (QID) | ORAL | Status: DC | PRN
  Administered 2018-02-28 – 2018-03-03 (×4): 650 mg via ORAL
  Filled 2018-02-27 (×4): qty 2

## 2018-02-27 MED ORDER — ENOXAPARIN SODIUM 30 MG/0.3ML ~~LOC~~ SOLN: 30.0000 mg | Freq: Two times a day (BID) | SUBCUTANEOUS | Status: DC

## 2018-02-27 MED ORDER — PANTOPRAZOLE SODIUM 40 MG PO TBEC
40.0000 mg | DELAYED_RELEASE_TABLET | Freq: Every day | ORAL | Status: DC
  Administered 2018-02-28 – 2018-03-02 (×3): 40 mg via ORAL
  Filled 2018-02-27 (×3): qty 1

## 2018-02-27 NOTE — Progress Notes (Addendum)
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 Barry Dienes and Dr. Alyson Ingles, 06/10 - Urology following  FEN: soft diet/carb mod VTE: SCD's, lovenox ID: no abx currently Foley: discontinued per urology 06/14 Follow up: Dr. Barry Dienes and Urology  DISPO: Discharge to CIR when bed available.      LOS: 8 days    Subjective: CC: "I'm supposed to go to rehab today"  Tolerating PO. No nausea. Reports he is urinating- I&O not recorded since 9am yesterday  Objective: Vital signs in last 24 hours: Temp:  [98.1 F (36.7 C)-98.8 F (37.1 C)] 98.5 F (36.9 C) (06/18 0300) Pulse Rate:  [67-77] 69 (06/18 0300) Resp:  [12-23] 23 (06/18 0300) BP: (153-176)/(44-83) 159/79 (06/18 0300) SpO2:  [93 %-100 %] 93 % (06/18 0300) Last BM Date: 02/24/18  Intake/Output from previous day: 06/17 0701 - 06/18 0700 In: 1440 [P.O.:240; I.V.:1200] Out: 150 [Urine:150] Intake/Output this shift: No intake/output data recorded.  PE: Gen:  Alert, NAD, pleasant, cooperative Card:  RRR, no lower extremity edema Pulm:  CTA, no W/R/R, effort normal Abd: Soft, ND, incision with staples intact is without signs of infection or drainage. Mild generalized TTP without guarding. No peritonitis.  Skin: no rashes noted, warm and dry   Anti-infectives: Anti-infectives (From admission, onward)   Start     Dose/Rate Route Frequency Ordered Stop   02/19/18 1800  Ampicillin-Sulbactam (UNASYN) 3 g in sodium chloride 0.9 % 100 mL IVPB     3 g 200 mL/hr over 30 Minutes Intravenous Every 6 hours 02/19/18 1634 02/19/18 1754   02/19/18 0630  ceFAZolin (ANCEF) IVPB 2g/100 mL premix     2 g 200 mL/hr over 30 Minutes Intravenous On call to O.R. 02/19/18 0625 02/19/18 1215   02/19/18 0628  ceFAZolin (ANCEF) 2-4 GM/100ML-% IVPB    Note to Pharmacy:   [name]   : cabinet override      02/19/18 0628 02/19/18 0815      Lab Results:  Recent Labs    02/25/18 1029 02/26/18 0210  WBC 9.5 11.2*  HGB 9.4* 9.4*  HCT 28.4* 28.7*  PLT 97* 116*   BMET Recent Labs    02/24/18 0951 02/26/18 0210  NA 133* 133*  K 3.9 3.7  CL 100* 100*  CO2 25 26  GLUCOSE 122* 142*  BUN 25* 23*  CREATININE 1.26* 1.38*  CALCIUM 8.6* 8.6*   PT/INR No results for input(s): LABPROT, INR in the last 72 hours. CMP     Component Value Date/Time   NA 133 (L) 02/26/2018 0210   NA 139 09/07/2017 0844   K 3.7 02/26/2018 0210   K 5.4 No visable hemolysis (H) 09/07/2017 0844   CL 100 (L) 02/26/2018 0210   CO2 26 02/26/2018 0210   CO2 27 09/07/2017 0844   GLUCOSE 142 (H) 02/26/2018 0210   GLUCOSE 227 (H) 09/07/2017 0844   BUN 23 (H) 02/26/2018 0210   BUN 29.8 (H) 09/07/2017 0844   CREATININE 1.38 (H) 02/26/2018 0210   CREATININE 0.76 12/13/2017 1100   CREATININE 1.2 09/07/2017 0844   CALCIUM 8.6 (L) 02/26/2018 0210   CALCIUM 9.8 09/07/2017 0844   PROT 5.1 (L) 02/26/2018 0210   PROT 6.9 09/07/2017 0844   ALBUMIN 2.9 (L) 02/26/2018 0210   ALBUMIN 3.9 09/07/2017 0844   AST 16 02/26/2018 0210   AST 16 12/13/2017 1100  AST 13 09/07/2017 0844   ALT 22 02/26/2018 0210   ALT 13 12/13/2017 1100   ALT 12 09/07/2017 0844   ALKPHOS 62 02/26/2018 0210   ALKPHOS 89 09/07/2017 0844   BILITOT 1.2 02/26/2018 0210   BILITOT 0.2 12/13/2017 1100   BILITOT 0.37 09/07/2017 0844   GFRNONAA 49 (L) 02/26/2018 0210   GFRNONAA >60 12/13/2017 1100   GFRAA 57 (L) 02/26/2018 0210   GFRAA >60 12/13/2017 1100   Lipase     Component Value Date/Time   LIPASE 19 09/29/2017 1114    Studies/Results: No results found.    Manuel Olson , [facility] 02/27/2018, 8:12 AM

## 2018-02-27 NOTE — H&P (Signed)
  Physical Medicine and Rehabilitation Admission H&P    : HPI: Manuel Olson is a 72-year-old right-handed male with history of CAD/MI and was on aspirin and Plavix, CVA, diabetes mellitus, former smoker.  Per chart review patient lives with spouse.  Independent prior to admission.  One level home.  He is retired.  Presented 02/19/2018 with recent diagnosis of pancreatic cancer incidentally identified while undergoing work-up for aortic valve surgery.  Patient had TAVR procedure then neoadjuvant treatment as well as left renal cancer.  Underwent pancreaticuduodenectomy with portal vein resection and placement of biliary and pancreatic duct stents 02/19/2018 per Dr. Byerly as well as open left radical nephrectomy by Dr. McKenzie.  Hospital course anemia patient was transfused.  Later placed on Lovenox for DVT prophylaxis.  Intermittent bouts of confusion that steadily improved.  Pathology report remains pending.  Diet has slowly been advanced.  Physical and occupational therapy evaluations completed with recommendations of physical medicine rehab consult.  Patient was admitted for a comprehensive rehab program.  Review of Systems  Constitutional: Negative for chills and fever.  HENT: Negative for hearing loss.   Eyes: Negative for blurred vision.  Respiratory: Positive for shortness of breath.   Cardiovascular: Positive for palpitations and leg swelling.  Gastrointestinal: Positive for constipation and nausea.       GERD  Genitourinary: Negative for dysuria and hematuria.  Musculoskeletal: Positive for myalgias.  Skin: Positive for rash.  Neurological: Negative for seizures.  All other systems reviewed and are negative.  Past Medical History:  Diagnosis Date  . Arthritis   . CAD (coronary artery disease)    a. 11/2016 in [place]: STEMI s/p DES to mid LAD, DES to diagonal, DES x 2 to proximal and mid RCA   . Essential hypertension   . Former smoker   . GERD (gastroesophageal reflux  disease)   . Heart murmur   . Hyperlipidemia   . Myocardial infarction (HCC)   . Pancreatic cancer (HCC)   . Pneumonia   . Renal mass   . S/P TAVR (transcatheter aortic valve replacement)    a. 08/2017:  Edwards Sapien 3 THV (size 26 mm, model #9600CM26A , serial # [id number])  . Severe aortic stenosis    a. 08/2017: s/p TAVR by Dr. McAlhany and Dr. Bartle.   . 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 Type 2 diabetes mellitus (HCC)    Past Surgical History:  Procedure Laterality Date  . APPENDECTOMY    . CARDIAC CATHETERIZATION    . EUS N/A 08/02/2017   Procedure: UPPER ENDOSCOPIC ULTRASOUND (EUS) RADIAL;  Surgeon: Jacobs, Daniel P, MD;  Location: [facility];  Service: Endoscopy;  Laterality: N/A;  . EYE SURGERY Left   . HAND SURGERY Bilateral   . HERNIA REPAIR Right   . LAPAROSCOPY N/A 02/19/2018   Procedure: DIAGNOSTIC LAPAROSCOPY, ERAS PATHWAY;  Surgeon: Byerly, Faera, MD;  Location: [facility];  Service: General;  Laterality: N/A;  . NEPHRECTOMY Left 02/19/2018   Procedure: OPEN LEFT RADICAL NEPHRECTOMY;  Surgeon: McKenzie, Patrick L, MD;  Location: [facility];  Service: Urology;  Laterality: Left;  . PORTACATH PLACEMENT N/A 09/15/2017   Procedure: INSERTION PORT-A-CATH;  Surgeon: Byerly, Faera, MD;  Location: [facility];  Service: General;  Laterality: N/A;  . RIGHT/LEFT HEART CATH AND CORONARY ANGIOGRAPHY N/A 07/05/2017   Procedure: RIGHT/LEFT HEART CATH AND CORONARY ANGIOGRAPHY;  Surgeon: Cooper, Michael, MD;  Location: [facility];  Service: Cardiovascular;  Laterality: N/A;  .   SHOULDER ARTHROSCOPY WITH ROTATOR CUFF REPAIR Left   . TEE WITHOUT CARDIOVERSION N/A 08/22/2017   Procedure: TRANSESOPHAGEAL ECHOCARDIOGRAM (TEE);  Surgeon: McAlhany, Christopher D, MD;  Location: [facility];  Service: Open Heart Surgery;  Laterality: N/A;  . TRANSCATHETER AORTIC VALVE REPLACEMENT, TRANSFEMORAL N/A 08/22/2017   Procedure: TRANSCATHETER AORTIC VALVE REPLACEMENT, TRANSFEMORAL;   Surgeon: McAlhany, Christopher D, MD;  Location: [facility];  Service: Open Heart Surgery;  Laterality: N/A;  . WHIPPLE PROCEDURE N/A 02/19/2018   Procedure: WHIPPLE PROCEDURE;  Surgeon: Byerly, Faera, MD;  Location: [facility];  Service: General;  Laterality: N/A;   Family History  Problem Relation Age of Onset  . Lupus Mother   . CAD Brother   . Hypertension Brother    Social History:  reports that he quit smoking about 22 months ago. His smoking use included cigarettes. He started smoking about 59 years ago. He has a 57.00 pack-year smoking history. He has never used smokeless tobacco. He reports that he does not drink alcohol or use drugs. Allergies: No Known Allergies Medications Prior to Admission  Medication Sig Dispense Refill  . aspirin EC 81 MG tablet Take 1 tablet (81 mg total) by mouth daily. (Patient taking differently: Take 81 mg by mouth daily at 2 PM. ) 90 tablet 3  . atorvastatin (LIPITOR) 20 MG tablet Take 1 tablet (20 mg total) by mouth daily. 90 tablet 1  . carvedilol (COREG) 3.125 MG tablet TAKE (1) TABLET BY MOUTH TWICE A DAY WITH MEALS (BREAKFAST AND SUPPER) 180 tablet 2  . clopidogrel (PLAVIX) 75 MG tablet Take 1 tablet (75 mg total) by mouth daily. (Patient taking differently: Take 75 mg by mouth daily with breakfast. ) 90 tablet 1  . Insulin Glargine (BASAGLAR KWIKPEN) 100 UNIT/ML SOPN Inject 0.2 mLs (20 Units total) into the skin at bedtime. 5 pen 5  . insulin lispro (HUMALOG) 100 UNIT/ML injection Inject 0.04-0.06 mLs (4-6 Units total) into the skin 3 (three) times daily before meals. Pens. (Patient taking differently: Inject 6 Units into the skin 2 (two) times daily before a meal. Breakfast & supper) 15 mL 11  . lidocaine-prilocaine (EMLA) cream Apply 1 application topically as needed. Apply small amount of cream over port site 1-2 hrs prior to chemo, cover with plastic wrap. (Patient taking differently: Apply 1 application topically as needed (for recieving chemo in port site).  Apply small amount of cream over port site 1-2 hrs prior to chemo, cover with plastic wrap.) 30 g 0  . lisinopril (PRINIVIL,ZESTRIL) 10 MG tablet Take 1 tablet (10 mg total) by mouth daily. 90 tablet 2  . baclofen (LIORESAL) 10 MG tablet Take 0.5 tablets (5 mg total) by mouth 3 (three) times daily as needed for muscle spasms. 30 each 0  . diphenoxylate-atropine (LOMOTIL) 2.5-0.025 MG tablet Take 2 tablets by mouth 4 (four) times daily as needed for diarrhea or loose stools. 60 tablet 0  . Insulin Pen Needle (CAREFINE PEN NEEDLES) 32G X 4 MM MISC Use 1x a day 100 each 3  . ondansetron (ZOFRAN) 8 MG tablet Take 1 tablet (8 mg total) by mouth every 8 (eight) hours as needed for nausea or vomiting. 20 tablet 0  . prochlorperazine (COMPAZINE) 5 MG tablet Take 1 tablet (5 mg total) by mouth every 6 (six) hours as needed for nausea or vomiting. 30 tablet 0    Drug Regimen Review Drug regimen was reviewed and remains appropriate with no significant issues identified  Home: Home Living Family/patient expects to   be discharged to:: Private residence Living Arrangements: Spouse/significant other Available Help at Discharge: Family, Available 24 hours/day Type of Home: House Home Access: Stairs to enter Entrance Stairs-Number of Steps: 3(small) Entrance Stairs-Rails: None(something there to pull on, but not a formal rail. ) Home Layout: One level Bathroom Shower/Tub: Tub/shower unit Bathroom Toilet: Standard Bathroom Accessibility: Yes Home Equipment: None   Functional History: Prior Function Level of Independence: Independent Comments: drives, retired  Functional Status:  Mobility: Bed Mobility Overal bed mobility: Needs Assistance Bed Mobility: Sit to Supine, Rolling, Sidelying to Sit Rolling: Min assist Sidelying to sit: Min assist Sit to supine: Supervision General bed mobility comments: verbal cues for technique and assist to elevate trunk into sitting; supervision when returning to  supine  Transfers Overall transfer level: Needs assistance Equipment used: Rolling walker (2 wheeled) Transfers: Sit to/from Stand Sit to Stand: Min guard General transfer comment: no physical assist but min guard for safety  Ambulation/Gait Ambulation/Gait assistance: Min assist Gait Distance (Feet): 500 Feet Assistive device: Rolling walker (2 wheeled) Gait Pattern/deviations: Step-through pattern, Shuffle, Trunk flexed General Gait Details: max directional v/c's to slow down, minA for safe walker management, tactile and directional cues to stand up straight, attempted to ambulate without RW, pt very unsteady requiring min/modA to maintain balance to prevent fall Gait velocity: slow Gait velocity interpretation: <1.31 ft/sec, indicative of household ambulator    ADL: ADL Overall ADL's : Needs assistance/impaired Grooming: Set up, Supervision/safety, Sitting Upper Body Bathing: Set up, Supervision/ safety, Sitting Lower Body Bathing: Minimal assistance, Sit to/from stand Upper Body Dressing : Minimal assistance, Sitting Lower Body Dressing: Sit to/from stand, Minimal assistance Toilet Transfer: Minimal assistance, RW, Ambulation Toileting- Clothing Manipulation and Hygiene: Minimal assistance(to steady pt) Functional mobility during ADLs: Minimal assistance, Rolling walker, Cueing for safety, Cueing for sequencing General ADL Comments: wife present and engaged in session; pt reports having already completed ADLs, engaged in bil UE/LE exercises and sit<>stands for increased strengthening and endurance   Cognition: Cognition Overall Cognitive Status: Impaired/Different from baseline Orientation Level: Oriented X4 Cognition Arousal/Alertness: Awake/alert Behavior During Therapy: Impulsive Overall Cognitive Status: Impaired/Different from baseline Area of Impairment: Awareness Memory: Decreased recall of precautions, Decreased short-term memory Following Commands: Follows one  step commands with increased time, Follows one step commands consistently Safety/Judgement: Decreased awareness of safety, Decreased awareness of deficits Awareness: Emergent Problem Solving: Requires tactile cues, Requires verbal cues General Comments: pt continues to remain impulsive, but demonstrates improvements overall with cognition, follows commands consistently with cues provided intermittently   Physical Exam: Blood pressure (!) 166/76, pulse 72, temperature 98.1 F (36.7 C), resp. rate 16, weight 73.9 kg (162 lb 14.7 oz), SpO2 96 %. Physical Exam  Constitutional: He is oriented to person, place, and time. No distress.  Frail appearing  HENT:  Head: Normocephalic and atraumatic.  Mouth/Throat: Oropharynx is clear and moist.  Eyes: Pupils are equal, round, and reactive to light. EOM are normal.  Neck: Normal range of motion. No thyromegaly present.  Cardiovascular: Normal rate and regular rhythm. Exam reveals no friction rub.  No murmur heard. Respiratory: Effort normal and breath sounds normal. No respiratory distress. He has no wheezes.  GI: Soft. Bowel sounds are normal.  Abdominal incision clean and dry with staples intact  Musculoskeletal: Normal range of motion. He exhibits no edema.  Neurological: He is alert and oriented to person, place, and time.  UE 4/5 prox to distal. LE: 3+ HF, 4 KE and 4/5 ADF/PF  Skin: Skin is warm and dry.   He is not diaphoretic.  Psychiatric: He has a normal mood and affect. His behavior is normal. Judgment and thought content normal.    Results for orders placed or performed during the hospital encounter of 02/19/18 (from the past 48 hour(s))  Glucose, capillary     Status: Abnormal   Collection Time: 02/25/18  1:48 PM  Result Value Ref Range   Glucose-Capillary 176 (H) 65 - 99 mg/dL  Glucose, capillary     Status: Abnormal   Collection Time: 02/25/18  4:37 PM  Result Value Ref Range   Glucose-Capillary 152 (H) 65 - 99 mg/dL  Glucose,  capillary     Status: Abnormal   Collection Time: 02/25/18  7:35 PM  Result Value Ref Range   Glucose-Capillary 120 (H) 65 - 99 mg/dL  Glucose, capillary     Status: Abnormal   Collection Time: 02/25/18 11:11 PM  Result Value Ref Range   Glucose-Capillary 159 (H) 65 - 99 mg/dL  Comprehensive metabolic panel     Status: Abnormal   Collection Time: 02/26/18  2:10 AM  Result Value Ref Range   Sodium 133 (L) 135 - 145 mmol/L   Potassium 3.7 3.5 - 5.1 mmol/L   Chloride 100 (L) 101 - 111 mmol/L   CO2 26 22 - 32 mmol/L   Glucose, Bld 142 (H) 65 - 99 mg/dL   BUN 23 (H) 6 - 20 mg/dL   Creatinine, Ser 1.38 (H) 0.61 - 1.24 mg/dL   Calcium 8.6 (L) 8.9 - 10.3 mg/dL   Total Protein 5.1 (L) 6.5 - 8.1 g/dL   Albumin 2.9 (L) 3.5 - 5.0 g/dL   AST 16 15 - 41 U/L   ALT 22 17 - 63 U/L   Alkaline Phosphatase 62 38 - 126 U/L   Total Bilirubin 1.2 0.3 - 1.2 mg/dL   GFR calc non Af Amer 49 (L) >60 mL/min   GFR calc Af Amer 57 (L) >60 mL/min    Comment: (NOTE) The eGFR has been calculated using the CKD EPI equation. This calculation has not been validated in all clinical situations. eGFR's persistently <60 mL/min signify possible Chronic Kidney Disease.    Anion gap 7 5 - 15    Comment: Performed at [facility], [address] [place], [place] [address]  CBC     Status: Abnormal   Collection Time: 02/26/18  2:10 AM  Result Value Ref Range   WBC 11.2 (H) 4.0 - 10.5 K/uL   RBC 3.09 (L) 4.22 - 5.81 MIL/uL   Hemoglobin 9.4 (L) 13.0 - 17.0 g/dL   HCT 28.7 (L) 39.0 - 52.0 %   MCV 92.9 78.0 - 100.0 fL   MCH 30.4 26.0 - 34.0 pg   MCHC 32.8 30.0 - 36.0 g/dL   RDW 15.4 11.5 - 15.5 %   Platelets 116 (L) 150 - 400 K/uL    Comment: CONSISTENT WITH PREVIOUS RESULT Performed at [facility], [address] [place], [place] [address]   Glucose, capillary     Status: Abnormal   Collection Time: 02/26/18  3:42 AM  Result Value Ref Range   Glucose-Capillary 133 (H) 65 - 99 mg/dL  Glucose,  capillary     Status: Abnormal   Collection Time: 02/26/18  7:51 AM  Result Value Ref Range   Glucose-Capillary 123 (H) 65 - 99 mg/dL  Glucose, capillary     Status: Abnormal   Collection Time: 02/26/18 12:48 PM  Result Value Ref Range     Glucose-Capillary 141 (H) 65 - 99 mg/dL   Comment 1 Notify RN    Comment 2 Document in Chart   Glucose, capillary     Status: Abnormal   Collection Time: 02/26/18  3:56 PM  Result Value Ref Range   Glucose-Capillary 108 (H) 65 - 99 mg/dL  Glucose, capillary     Status: None   Collection Time: 02/26/18  7:56 PM  Result Value Ref Range   Glucose-Capillary 82 65 - 99 mg/dL  Glucose, capillary     Status: Abnormal   Collection Time: 02/26/18 11:25 PM  Result Value Ref Range   Glucose-Capillary 123 (H) 65 - 99 mg/dL  Glucose, capillary     Status: None   Collection Time: 02/27/18  3:55 AM  Result Value Ref Range   Glucose-Capillary 95 65 - 99 mg/dL  Glucose, capillary     Status: None   Collection Time: 02/27/18  7:30 AM  Result Value Ref Range   Glucose-Capillary 92 65 - 99 mg/dL   Comment 1 Notify RN    Comment 2 Document in Chart   Glucose, capillary     Status: Abnormal   Collection Time: 02/27/18 12:15 PM  Result Value Ref Range   Glucose-Capillary 142 (H) 65 - 99 mg/dL   No results found.     Medical Problem List and Plan: 1.  Debility secondary to complication related to pancreatic cancer/left radical nephrectomy 02/19/2018 as well as recent TAVR procedure  -admit to inpatient rehab 2.  DVT Prophylaxis/Anticoagulation: Lovenox.  Monitor for any bleeding episodes 3. Pain Management: Neurontin 200 mg twice daily, baclofen 10 mg 3 times daily as needed, oxycodone as needed 4. Mood: Provide emotional support 5. Neuropsych: This patient is capable of making decisions on his own behalf. 6. Skin/Wound Care: Routine skin checks 7. Fluids/Electrolytes/Nutrition: Routine in and outs with follow-up chemistries  -stressed importance of  adequate po intake for recovery 8.  Acute blood loss anemia.  Follow-up CBC 9.  Diabetes mellitus.  Hemoglobin A1c 8.2.  Lantus insulin 22 units nightly.  Check blood sugars before meals and at bedtime.  Diabetic teaching 10.  Hypertension.  Lisinopril 10 mg daily, Coreg 3.125 mg twice daily. 11.  Hyperlipidemia.  Lipitor 12.  CAD/MI.  Patient on aspirin and Plavix prior to admission and remains on hold after recent surgery.  No chest pain or shortness of breath  Post Admission Physician Evaluation: 1. Functional deficits secondary  to debilty. 2. Patient is admitted to receive collaborative, interdisciplinary care between the physiatrist, rehab nursing staff, and therapy team. 3. Patient's level of medical complexity and substantial therapy needs in context of that medical necessity cannot be provided at a lesser intensity of care such as a SNF. 4. Patient has experienced substantial functional loss from his/her baseline which was documented above under the "Functional History" and "Functional Status" headings.  Judging by the patient's diagnosis, physical exam, and functional history, the patient has potential for functional progress which will result in measurable gains while on inpatient rehab.  These gains will be of substantial and practical use upon discharge  in facilitating mobility and self-care at the household level. 5. Physiatrist will provide 24 hour management of medical needs as well as oversight of the therapy plan/treatment and provide guidance as appropriate regarding the interaction of the two. 6. The Preadmission Screening has been reviewed and patient status is unchanged unless otherwise stated above. 7. 24 hour rehab nursing will assist with bladder management, bowel management, safety, skin/wound care,   disease management, medication administration, pain management and patient education  and help integrate therapy concepts, techniques,education, etc. 8. PT will assess and treat  for/with: Lower extremity strength, range of motion, stamina, balance, functional mobility, safety, adaptive techniques and equipment, community reentry, family ed.   Goals are: mod I. 9. OT will assess and treat for/with: ADL's, functional mobility, safety, upper extremity strength, adaptive techniques and equipment, pain mgt, core muscle strengthening, community reentry.   Goals are: mod I. Therapy may proceed with showering this patient. 10. SLP will assess and treat for/with: n/a.  Goals are: n/a. 11. Case Management and Social Worker will assess and treat for psychological issues and discharge planning. 12. Team conference will be held weekly to assess progress toward goals and to determine barriers to discharge. 13. Patient will receive at least 3 hours of therapy per day at least 5 days per week. 14. ELOS: 5-7 days       15. Prognosis:  excellent   I have personally performed a face to face diagnostic evaluation of this patient and formulated the key components of the plan.  Additionally, I have personally reviewed laboratory data, imaging studies, as well as relevant notes and concur with the physician assistant's documentation above.   T. , MD, FAAPMR   Daniel J Angiulli, PA-C 02/27/2018 

## 2018-02-27 NOTE — Care Management Note (Signed)
Case Management Note  Patient Details  Name: Manuel Olson MRN: [id number] Date of Birth: 04/15/1946  Subjective/Objective:  72 y.o. male admitted on 02/19/18 for nephrectomy due to L nephrectomy and diagnostic laparoscopy, classic pancreaticoduodenectomy with portal vein resection, and placement of biliar and pancreatic duct stents (whipple).   PTA, pt independent, lives with spouse.                 Action/Plan: PT/OT recommending CIR, and pt has been accepted for admission to rehab unit today.     Expected Discharge Date:  02/26/18               Expected Discharge Plan:  [place]  In-House Referral:     Discharge planning Services  CM Consult  Post Acute Care Choice:    Choice offered to:     DME Arranged:    DME Agency:     HH Arranged:    HH Agency:     Status of Service:  Completed, signed off  If discussed at [employer], dates discussed:    Additional Comments:  Reinaldo Raddle, RN, BSN  Trauma/Neuro ICU Case Manager [phone]

## 2018-02-27 NOTE — Progress Notes (Addendum)
Pt offered a bath before transfer but refused Discharge to CIR room #13 Report given IV was removed Family here to walk with patient to CIR

## 2018-02-27 NOTE — PMR Pre-admission (Signed)
PMR Admission Coordinator Pre-Admission Assessment  Manuel Olson: Manuel Olson is an 72 y.o., male MRN: [id number] DOB: [birth year] Height: 5' 9"  (175.3 cm) Weight: 73.9 kg (162 lb 14.7 oz)              Insurance Information HMO: No   PPO:       PCP:       IPA:       80/20:       OTHER:   PRIMARY:  Medicare A and B      Policy#: [id number]      Subscriber:  Manuel Olson CM Name:        Phone#:       Fax#:   Pre-Cert#:        Employer: Retired Benefits:  Phone #:       Name: Checked in [place]. Date: A=09/12/08 and B=03/13/15     Deduct: $1364      Out of Pocket Max:  None      Life Max: N/A CIR: 100%      SNF: 100 days Outpatient: 80%     Co-Pay: 20% Home Health: 100%      Co-Pay: none DME: 80%     Co-Pay: 20% Providers: Manuel Olson's choice  SECONDARY: BCBS supplement      Policy#: [id number]      Subscriber:  Manuel Olson CM Name:        Phone#:       Fax#:   Pre-Cert#:        Employer: Retired Benefits:  Phone #: [phone]     Name:   Eff. Date:       Deduct:        Out of Pocket Max:        Life Max:   CIR:        SNF:   Outpatient:       Co-Pay:   Home Health:        Co-Pay:   DME:       Co-Pay:    Emergency Contact Information Contact Information    Name Relation Home Work [place] Spouse [phone]  [phone]     Current Medical History  Manuel Olson Admitting Diagnosis: Debility after complications related to pancreatic cancer, multiple surgeries, recent TAVR   History of Present Illness: A 72 year old right-handed male with history of CAD/MI and was on aspirin and Plavix, CVA, diabetes mellitus, former smoker.  Per chart review Manuel Olson lives with spouse.  Independent prior to admission.  One level home.  He is retired.  Presented 02/19/2018 with recent diagnosis of pancreatic cancer incidentally identified while undergoing work-up for aortic valve surgery.  Manuel Olson had TAVR procedure then neoadjuvant treatment as well as left renal cancer.   Underwent pancreaticuduodenectomy with portal vein resection and placement of biliary and pancreatic duct stents 02/19/2018 per Dr. Barry Dienes as well as open left radical nephrectomy by Dr. Alyson Ingles.  Hospital course anemia Manuel Olson was transfused.  Later placed on Lovenox for DVT prophylaxis.  Intermittent bouts of confusion that steadily improved.  Pathology report remains pending.  Diet has slowly been advanced.  Physical and occupational therapy evaluations completed with recommendations of physical medicine rehab consult.  Manuel Olson was admitted for a comprehensive rehab program.   Past Medical History  Past Medical History:  Diagnosis Date  . Arthritis   . CAD (coronary artery disease)    a. 11/2016 in [place]: STEMI s/p DES to mid LAD, DES to diagonal, DES x 2 to  proximal and mid RCA   . Essential hypertension   . Former smoker   . GERD (gastroesophageal reflux disease)   . Heart murmur   . Hyperlipidemia   . Myocardial infarction ([place])   . Pancreatic cancer ([facility])   . Pneumonia   . Renal mass   . S/P TAVR (transcatheter aortic valve replacement)    a. 08/2017:  Edwards Sapien 3 THV (size 26 mm, model #9600CM26A , serial # [id number])  . Severe aortic stenosis    a. 08/2017: s/p TAVR by Dr. Angelena Form and Dr. Cyndia Bent.   . 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 Type 2 diabetes mellitus (H[facility])     Family History  family history includes CAD in his brother; Hypertension in his brother; Lupus in his mother.  Prior Rehab/Hospitalizations: No previous rehab  Has the Manuel Olson had major surgery during 100 days prior to admission? No  Current Medications   Current Facility-Administered Medications:  .  0.9 %  sodium chloride infusion, , Intravenous, Once, [name], MD .  acetaminophen (TYLENOL) tablet 650 mg, 650 mg, Oral, Q6H PRN, [name], MD .  atorvastatin (LIPITOR) tablet 20 mg, 20 mg, Oral, Daily, [name], MD, 20 mg at 02/27/18 1027 .  baclofen (LIORESAL) tablet 10 mg, 10 mg,  Oral, TID PRN, [name], MD, 10 mg at 02/23/18 1042 .  carvedilol (COREG) tablet 3.125 mg, 3.125 mg, Oral, BID WC, [name], MD, 3.125 mg at 02/27/18 0848 .  dextrose 5 %-0.9 % sodium chloride infusion, , Intravenous, Continuous, Focht, Jessica L, PA, Last Rate: 50 mL/hr at 02/27/18 1200 .  enoxaparin (LOVENOX) injection 30 mg, 30 mg, Subcutaneous, Q12H, Excell Seltzer, MD, 30 mg at 02/27/18 1126 .  feeding supplement (ENSURE ENLIVE) (ENSURE ENLIVE) liquid 237 mL, 237 mL, Oral, BID BM, Focht, Jessica L, PA, 237 mL at 02/27/18 1028 .  gabapentin (NEURONTIN) capsule 200 mg, 200 mg, Oral, BID, [name], MD, 200 mg at 02/27/18 1026 .  HYDROmorphone (DILAUDID) injection 0.5-1 mg, 0.5-1 mg, Intravenous, Q2H PRN, [name], MD, 1 mg at 02/23/18 2206 .  insulin aspart (novoLOG) injection 0-9 Units, 0-9 Units, Subcutaneous, Q4H, [name], MD, 1 Units at 02/26/18 1744 .  insulin glargine (LANTUS) injection 22 Units, 22 Units, Subcutaneous, QHS, [name], MD, 22 Units at 02/26/18 2130 .  lisinopril (PRINIVIL,ZESTRIL) tablet 10 mg, 10 mg, Oral, Daily, [name], MD, 10 mg at 02/27/18 1026 .  ondansetron (ZOFRAN-ODT) disintegrating tablet 4 mg, 4 mg, Oral, Q6H PRN **OR** ondansetron (ZOFRAN) injection 4 mg, 4 mg, Intravenous, Q6H PRN, [name], MD .  oxyCODONE (Oxy IR/ROXICODONE) immediate release tablet 5-10 mg, 5-10 mg, Oral, Q4H PRN, [name], MD, 10 mg at 02/26/18 2130 .  pantoprazole (PROTONIX) EC tablet 40 mg, 40 mg, Oral, Q1200, [name], MD, 40 mg at 02/27/18 1127  Patients Current Diet:  Diet Order           DIET SOFT Room service appropriate? Yes; Fluid consistency: Thin  Diet effective now          Precautions / Restrictions Precautions Precautions: Fall, Other (comment) Precaution Comments: abdomen Restrictions Weight Bearing Restrictions: No   Has the Manuel Olson had 2 or more falls or a fall with injury in the past year?Yes.  Manuel Olson  reports 2 falls with minor injury in the past year  Prior Activity Level Manuel Olson went out 1 X a week, was driving.  Did go outside more frequently  Home Assistive  Devices / Equipment Home Assistive Devices/Equipment: Eyeglasses, CBG Meter, Dentures (specify type), Hearing aid Home Equipment: None  Prior Device Use: Indicate devices/aids used by the Manuel Olson prior to current illness, exacerbation or injury? None  Prior Functional Level Prior Function Level of Independence: Independent Comments: drives, retired  Self Care: Did the Manuel Olson need help bathing, dressing, using the toilet or eating?  Independent  Indoor Mobility: Did the Manuel Olson need assistance with walking from room to room (with or without device)? Independent  Stairs: Did the Manuel Olson need assistance with internal or external stairs (with or without device)? Independent  Functional Cognition: Did the Manuel Olson need help planning regular tasks such as shopping or remembering to take medications? Independent  Current Functional Level Cognition  Overall Cognitive Status: Impaired/Different from baseline Orientation Level: Oriented X4 Following Commands: Follows one step commands with increased time, Follows one step commands consistently Safety/Judgement: Decreased awareness of safety, Decreased awareness of deficits General Comments: pt continues to remain impulsive, but demonstrates improvements overall with cognition, follows commands consistently with cues provided intermittently     Extremity Assessment (includes Sensation/Coordination)  Upper Extremity Assessment: Generalized weakness  Lower Extremity Assessment: Defer to PT evaluation    ADLs  Overall ADL's : Needs assistance/impaired Grooming: Set up, Supervision/safety, Sitting Upper Body Bathing: Set up, Supervision/ safety, Sitting Lower Body Bathing: Minimal assistance, Sit to/from stand Upper Body Dressing : Minimal assistance, Sitting Lower Body  Dressing: Sit to/from stand, Minimal assistance Toilet Transfer: Minimal assistance, RW, Ambulation Toileting- Clothing Manipulation and Hygiene: Minimal assistance(to steady pt) Functional mobility during ADLs: Minimal assistance, Rolling walker, Cueing for safety, Cueing for sequencing General ADL Comments: wife present and engaged in session; pt reports having already completed ADLs, engaged in bil UE/LE exercises and sit<>stands for increased strengthening and endurance     Mobility  Overal bed mobility: Needs Assistance Bed Mobility: Sit to Supine, Rolling, Sidelying to Sit Rolling: Min assist Sidelying to sit: Min assist Sit to supine: Supervision General bed mobility comments: verbal cues for technique and assist to elevate trunk into sitting; supervision when returning to supine     Transfers  Overall transfer level: Needs assistance Equipment used: Rolling walker (2 wheeled) Transfers: Sit to/from Stand Sit to Stand: Min guard General transfer comment: no physical assist but min guard for safety     Ambulation / Gait / Stairs / Wheelchair Mobility  Ambulation/Gait Ambulation/Gait assistance: Herbalist (Feet): 500 Feet Assistive device: Rolling walker (2 wheeled) Gait Pattern/deviations: Step-through pattern, Shuffle, Trunk flexed General Gait Details: max directional v/c's to slow down, minA for safe walker management, tactile and directional cues to stand up straight, attempted to ambulate without RW, pt very unsteady requiring min/modA to maintain balance to prevent fall Gait velocity: slow Gait velocity interpretation: <1.31 ft/sec, indicative of household ambulator    Posture / Balance Dynamic Sitting Balance Sitting balance - Comments: pt able to done socks without difficulty in chair Balance Overall balance assessment: Needs assistance Sitting-balance support: No upper extremity supported, Feet supported Sitting balance-Leahy Scale: Good Sitting  balance - Comments: pt able to done socks without difficulty in chair Standing balance support: No upper extremity supported Standing balance-Leahy Scale: Fair Standing balance comment: able to release RW to stand to urinate    Special needs/care consideration BiPAP/CPAP No CPM No Continuous Drip IV D5%0.9 NS at 50 mL/hr Dialysis No     Life Vest No Oxygen No Special Bed No Trach Size No Wound Vac (area) No   Skin Abdominal incision  post whipple procedure                           Bowel mgmt: Last BM 02/27/18 per Manuel Olson Bladder mgmt: Voiding up in bathroom with assist, has urgency Diabetic mgmt Yes, now on oral medication and insuline    Previous Home Environment Living Arrangements: Spouse/significant other Available Help at Discharge: Family, Available 24 hours/day Type of Home: House Home Layout: One level Home Access: Stairs to enter Entrance Stairs-Rails: None(something there to pull on, but not a formal rail. ) Entrance Stairs-Number of Steps: 3(small) Bathroom Shower/Tub: Optometrist: Yes How Accessible: Accessible via walker Home Care Services: No  Discharge Living Setting Plans for Discharge Living Setting: Manuel Olson's home, House, Lives with (comment)(Lives with wife.) Type of Home at Discharge: House Discharge Home Layout: Multi-level, Laundry or work area in basement, Able to live on main level with bedroom/bathroom Alternate Level Stairs-Number of Steps: 14 Discharge Home Access: Stairs to enter Entrance Stairs-Number of Steps: 2 Does the Manuel Olson have any problems obtaining your medications?: No  Social/Family/Support Systems Manuel Olson Roles: Spouse(Has a wife) Contact Information: Meade [place] - wife - [phone] Anticipated Caregiver: Wife Ability/Limitations of Caregiver: Wife is retired and can assist. Careers adviser: 24/7 Discharge Plan Discussed with Primary Caregiver: Yes Is Caregiver In  Agreement with Plan?: Yes Does Caregiver/Family have Issues with Lodging/Transportation while Pt is in Rehab?: No  Goals/Additional Needs Manuel Olson/Family Goal for Rehab: PT/OT mod I and supervision goals Expected length of stay: 8-12 days Cultural Considerations: None Dietary Needs: soft diet, thin liquids Equipment Needs: TBD Pt/Family Agrees to Admission and willing to participate: Yes Program Orientation Provided & Reviewed with Pt/Caregiver Including Roles  & Responsibilities: Yes  Decrease burden of Care through IP rehab admission: N/A  Possible need for SNF placement upon discharge: Not anticipated  Manuel Olson Condition: This Manuel Olson's condition remains as documented in the consult dated 02/26/18, in which the Rehabilitation Physician determined and documented that the Manuel Olson's condition is appropriate for intensive rehabilitative care in an inpatient rehabilitation facility. Will admit to inpatient rehab today.  Preadmission Screen Completed By:  [name], 02/27/2018 2:11 PM ______________________________________________________________________   Discussed status with Dr. Naaman Plummer on 02/27/18 at 1410 and received telephone approval for admission today.  Admission Coordinator:  [name], time 1411/Date 02/27/18

## 2018-02-27 NOTE — Care Management Important Message (Signed)
Important Message  Patient Details  Name: Manuel Olson MRN: [id number] Date of Birth: 11-Nov-1945   Medicare Important Message Given:  Yes    Ella Bodo, RN 02/27/2018, 3:55 PM

## 2018-02-27 NOTE — Care Management Important Message (Signed)
Important Message  Patient Details  Name: KIELAN DREISBACH MRN: [id number] Date of Birth: 09-09-1946   Medicare Important Message Given:  Yes    Margean Korell P [place] 02/27/2018, 3:01 PM

## 2018-02-27 NOTE — Progress Notes (Signed)
Rehab admissions - I met with patient today.  He would like inpatient rehab and then will go home with his wife.  I discussed his progress with Dr. Naaman Plummer.  Dr. Naaman Plummer feels that patient can admit to CIR for about 7 days.  Bed available on CIR today and will admit to CIR today.  Call me for questions.  #[phone]

## 2018-02-27 NOTE — H&P (Signed)
Physical Medicine and Rehabilitation Admission H&P    : HPI: Manuel Olson is a 72 year old right-handed male with history of CAD/MI and was on aspirin and Plavix, CVA, diabetes mellitus, former smoker.  Per chart review patient lives with spouse.  Independent prior to admission.  One level home.  He is retired.  Presented 02/19/2018 with recent diagnosis of pancreatic cancer incidentally identified while undergoing work-up for aortic valve surgery.  Patient had TAVR procedure then neoadjuvant treatment as well as left renal cancer.  Underwent pancreaticuduodenectomy with portal vein resection and placement of biliary and pancreatic duct stents 02/19/2018 per Dr. Barry Dienes as well as open left radical nephrectomy by Dr. Alyson Ingles.  Hospital course anemia patient was transfused.  Later placed on Lovenox for DVT prophylaxis.  Intermittent bouts of confusion that steadily improved.  Pathology report remains pending.  Diet has slowly been advanced.  Physical and occupational therapy evaluations completed with recommendations of physical medicine rehab consult.  Patient was admitted for a comprehensive rehab program.  Review of Systems  Constitutional: Negative for chills and fever.  HENT: Negative for hearing loss.   Eyes: Negative for blurred vision.  Respiratory: Positive for shortness of breath.   Cardiovascular: Positive for palpitations and leg swelling.  Gastrointestinal: Positive for constipation and nausea.       GERD  Genitourinary: Negative for dysuria and hematuria.  Musculoskeletal: Positive for myalgias.  Skin: Positive for rash.  Neurological: Negative for seizures.  All other systems reviewed and are negative.      Past Medical History:  Diagnosis Date  . Arthritis   . CAD (coronary artery disease)    a. 11/2016 in [place]: STEMI s/p DES to mid LAD, DES to diagonal, DES x 2 to proximal and mid RCA   . Essential hypertension   . Former smoker   . GERD (gastroesophageal  reflux disease)   . Heart murmur   . Hyperlipidemia   . Myocardial infarction ([place])   . Pancreatic cancer ([place])   . Pneumonia   . Renal mass   . S/P TAVR (transcatheter aortic valve replacement)    a. 08/2017:  Edwards Sapien 3 THV (size 26 mm, model #9600CM26A , serial # [id number])  . Severe aortic stenosis    a. 08/2017: s/p TAVR by Dr. Angelena Form and Dr. Cyndia Bent.   . 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 Type 2 diabetes mellitus ([place])         Past Surgical History:  Procedure Laterality Date  . APPENDECTOMY    . CARDIAC CATHETERIZATION    . EUS N/A 08/02/2017   Procedure: UPPER ENDOSCOPIC ULTRASOUND (EUS) RADIAL;  Surgeon: [name], MD;  Location: [facility];  Service: Endoscopy;  Laterality: N/A;  . EYE SURGERY Left   . HAND SURGERY Bilateral   . HERNIA REPAIR Right   . LAPAROSCOPY N/A 02/19/2018   Procedure: DIAGNOSTIC LAPAROSCOPY, ERAS PATHWAY;  Surgeon: [name], MD;  Location: [place];  Service: General;  Laterality: N/A;  . NEPHRECTOMY Left 02/19/2018   Procedure: OPEN LEFT RADICAL NEPHRECTOMY;  Surgeon: Cleon Gustin, MD;  Location: [place];  Service: Urology;  Laterality: Left;  . PORTACATH PLACEMENT N/A 09/15/2017   Procedure: INSERTION PORT-A-CATH;  Surgeon: [name], MD;  Location: [place] [facility];  Service: General;  Laterality: N/A;  . RIGHT/LEFT HEART CATH AND CORONARY ANGIOGRAPHY N/A 07/05/2017   Procedure: RIGHT/LEFT HEART CATH AND CORONARY ANGIOGRAPHY;  Surgeon: [name], MD;  Location: [facility]  INVASIVE CV LAB;  Service: Cardiovascular;  Laterality: N/A;  . SHOULDER ARTHROSCOPY WITH ROTATOR CUFF REPAIR Left   . TEE WITHOUT CARDIOVERSION N/A 08/22/2017   Procedure: TRANSESOPHAGEAL ECHOCARDIOGRAM (TEE);  Surgeon: Burnell Blanks, MD;  Location: [place];  Service: Open Heart Surgery;  Laterality: N/A;  . TRANSCATHETER AORTIC VALVE REPLACEMENT, TRANSFEMORAL N/A 08/22/2017   Procedure: TRANSCATHETER  AORTIC VALVE REPLACEMENT, TRANSFEMORAL;  Surgeon: Burnell Blanks, MD;  Location: [place];  Service: Open Heart Surgery;  Laterality: N/A;  . WHIPPLE PROCEDURE N/A 02/19/2018   Procedure: WHIPPLE PROCEDURE;  Surgeon: [name], MD;  Location: [facility] OR;  Service: General;  Laterality: N/A;        Family History  Problem Relation Age of Onset  . Lupus Mother   . CAD Brother   . Hypertension Brother    Social History:  reports that he quit smoking about 22 months ago. His smoking use included cigarettes. He started smoking about 59 years ago. He has a 57.00 pack-year smoking history. He has never used smokeless tobacco. He reports that he does not drink alcohol or use drugs. Allergies: No Known Allergies       Medications Prior to Admission  Medication Sig Dispense Refill  . aspirin EC 81 MG tablet Take 1 tablet (81 mg total) by mouth daily. (Patient taking differently: Take 81 mg by mouth daily at 2 PM. ) 90 tablet 3  . atorvastatin (LIPITOR) 20 MG tablet Take 1 tablet (20 mg total) by mouth daily. 90 tablet 1  . carvedilol (COREG) 3.125 MG tablet TAKE (1) TABLET BY MOUTH TWICE A DAY WITH MEALS (BREAKFAST AND SUPPER) 180 tablet 2  . clopidogrel (PLAVIX) 75 MG tablet Take 1 tablet (75 mg total) by mouth daily. (Patient taking differently: Take 75 mg by mouth daily with breakfast. ) 90 tablet 1  . Insulin Glargine (BASAGLAR KWIKPEN) 100 UNIT/ML SOPN Inject 0.2 mLs (20 Units total) into the skin at bedtime. 5 pen 5  . insulin lispro (HUMALOG) 100 UNIT/ML injection Inject 0.04-0.06 mLs (4-6 Units total) into the skin 3 (three) times daily before meals. Pens. (Patient taking differently: Inject 6 Units into the skin 2 (two) times daily before a meal. Breakfast & supper) 15 mL 11  . lidocaine-prilocaine (EMLA) cream Apply 1 application topically as needed. Apply small amount of cream over port site 1-2 hrs prior to chemo, cover with plastic wrap. (Patient taking differently: Apply 1  application topically as needed (for recieving chemo in port site). Apply small amount of cream over port site 1-2 hrs prior to chemo, cover with plastic wrap.) 30 g 0  . lisinopril (PRINIVIL,ZESTRIL) 10 MG tablet Take 1 tablet (10 mg total) by mouth daily. 90 tablet 2  . baclofen (LIORESAL) 10 MG tablet Take 0.5 tablets (5 mg total) by mouth 3 (three) times daily as needed for muscle spasms. 30 each 0  . diphenoxylate-atropine (LOMOTIL) 2.5-0.025 MG tablet Take 2 tablets by mouth 4 (four) times daily as needed for diarrhea or loose stools. 60 tablet 0  . Insulin Pen Needle (CAREFINE PEN NEEDLES) 32G X 4 MM MISC Use 1x a day 100 each 3  . ondansetron (ZOFRAN) 8 MG tablet Take 1 tablet (8 mg total) by mouth every 8 (eight) hours as needed for nausea or vomiting. 20 tablet 0  . prochlorperazine (COMPAZINE) 5 MG tablet Take 1 tablet (5 mg total) by mouth every 6 (six) hours as needed for nausea or vomiting. 30 tablet 0  Drug Regimen Review Drug regimen was reviewed and remains appropriate with no significant issues identified  Home: Home Living Family/patient expects to be discharged to:: Private residence Living Arrangements: Spouse/significant other Available Help at Discharge: Family, Available 24 hours/day Type of Home: House Home Access: Stairs to enter [employer] of Steps: 3(small) Entrance Stairs-Rails: None(something there to pull on, but not a formal rail. ) Home Layout: One level Bathroom Shower/Tub: Chiropodist: Standard Bathroom Accessibility: Yes Home Equipment: None   Functional History: Prior Function Level of Independence: Independent Comments: drives, retired  Functional Status:  Mobility: Bed Mobility Overal bed mobility: Needs Assistance Bed Mobility: Sit to Supine, Rolling, Sidelying to Sit Rolling: Min assist Sidelying to sit: Min assist Sit to supine: Supervision General bed mobility comments: verbal cues for technique  and assist to elevate trunk into sitting; supervision when returning to supine  Transfers Overall transfer level: Needs assistance Equipment used: Rolling walker (2 wheeled) Transfers: Sit to/from Stand Sit to Stand: Min guard General transfer comment: no physical assist but min guard for safety  Ambulation/Gait Ambulation/Gait assistance: Herbalist (Feet): 500 Feet Assistive device: Rolling walker (2 wheeled) Gait Pattern/deviations: Step-through pattern, Shuffle, Trunk flexed General Gait Details: max directional v/c's to slow down, minA for safe walker management, tactile and directional cues to stand up straight, attempted to ambulate without RW, pt very unsteady requiring min/modA to maintain balance to prevent fall Gait velocity: slow Gait velocity interpretation: <1.31 ft/sec, indicative of household ambulator  ADL: ADL Overall ADL's : Needs assistance/impaired Grooming: Set up, Supervision/safety, Sitting Upper Body Bathing: Set up, Supervision/ safety, Sitting Lower Body Bathing: Minimal assistance, Sit to/from stand Upper Body Dressing : Minimal assistance, Sitting Lower Body Dressing: Sit to/from stand, Minimal assistance Toilet Transfer: Minimal assistance, RW, Ambulation Toileting- Clothing Manipulation and Hygiene: Minimal assistance(to steady pt) Functional mobility during ADLs: Minimal assistance, Rolling walker, Cueing for safety, Cueing for sequencing General ADL Comments: wife present and engaged in session; pt reports having already completed ADLs, engaged in bil UE/LE exercises and sit<>stands for increased strengthening and endurance   Cognition: Cognition Overall Cognitive Status: Impaired/Different from baseline Orientation Level: Oriented X4 Cognition Arousal/Alertness: Awake/alert Behavior During Therapy: Impulsive Overall Cognitive Status: Impaired/Different from baseline Area of Impairment: Awareness Memory: Decreased recall of  precautions, Decreased short-term memory Following Commands: Follows one step commands with increased time, Follows one step commands consistently Safety/Judgement: Decreased awareness of safety, Decreased awareness of deficits Awareness: Emergent Problem Solving: Requires tactile cues, Requires verbal cues General Comments: pt continues to remain impulsive, but demonstrates improvements overall with cognition, follows commands consistently with cues provided intermittently   Physical Exam: Blood pressure (!) 166/76, pulse 72, temperature 98.1 F (36.7 C), resp. rate 16, weight 73.9 kg (162 lb 14.7 oz), SpO2 96 %. Physical Exam  Constitutional: He is oriented to person, place, and time. No distress.  Frail appearing  HENT:  Head: Normocephalic and atraumatic.  Mouth/Throat: Oropharynx is clear and moist.  Eyes: Pupils are equal, round, and reactive to light. EOM are normal.  Neck: Normal range of motion. No thyromegaly present.  Cardiovascular: Normal rate and regular rhythm. Exam reveals no friction rub.  No murmur heard. Respiratory: Effort normal and breath sounds normal. No respiratory distress. He has no wheezes.  GI: Soft. Bowel sounds are normal.  Abdominal incision clean and dry with staples intact  Musculoskeletal: Normal range of motion. He exhibits no edema.  Neurological: He is alert and oriented to person, place, and time.  UE 4/5 prox to distal. LE: 3+ HF, 4 KE and 4/5 ADF/PF  Skin: Skin is warm and dry. He is not diaphoretic.  Psychiatric: He has a normal mood and affect. His behavior is normal. Judgment and thought content normal.    LabResultsLast48Hours  Results for orders placed or performed during the hospital encounter of 02/19/18 (from the past 48 hour(s))  Glucose, capillary     Status: Abnormal   Collection Time: 02/25/18  1:48 PM  Result Value Ref Range   Glucose-Capillary 176 (H) 65 - 99 mg/dL  Glucose, capillary     Status: Abnormal    Collection Time: 02/25/18  4:37 PM  Result Value Ref Range   Glucose-Capillary 152 (H) 65 - 99 mg/dL  Glucose, capillary     Status: Abnormal   Collection Time: 02/25/18  7:35 PM  Result Value Ref Range   Glucose-Capillary 120 (H) 65 - 99 mg/dL  Glucose, capillary     Status: Abnormal   Collection Time: 02/25/18 11:11 PM  Result Value Ref Range   Glucose-Capillary 159 (H) 65 - 99 mg/dL  Comprehensive metabolic panel     Status: Abnormal   Collection Time: 02/26/18  2:10 AM  Result Value Ref Range   Sodium 133 (L) 135 - 145 mmol/L   Potassium 3.7 3.5 - 5.1 mmol/L   Chloride 100 (L) 101 - 111 mmol/L   CO2 26 22 - 32 mmol/L   Glucose, Bld 142 (H) 65 - 99 mg/dL   BUN 23 (H) 6 - 20 mg/dL   Creatinine, Ser 1.38 (H) 0.61 - 1.24 mg/dL   Calcium 8.6 (L) 8.9 - 10.3 mg/dL   Total Protein 5.1 (L) 6.5 - 8.1 g/dL   Albumin 2.9 (L) 3.5 - 5.0 g/dL   AST 16 15 - 41 U/L   ALT 22 17 - 63 U/L   Alkaline Phosphatase 62 38 - 126 U/L   Total Bilirubin 1.2 0.3 - 1.2 mg/dL   GFR calc non Af Amer 49 (L) >60 mL/min   GFR calc Af Amer 57 (L) >60 mL/min    Comment: (NOTE) The eGFR has been calculated using the CKD EPI equation. This calculation has not been validated in all clinical situations. eGFR's persistently <60 mL/min signify possible Chronic Kidney Disease.    Anion gap 7 5 - 15    Comment: Performed at [facility] [address], [place], [place] [address]  CBC     Status: Abnormal   Collection Time: 02/26/18  2:10 AM  Result Value Ref Range   WBC 11.2 (H) 4.0 - 10.5 K/uL   RBC 3.09 (L) 4.22 - 5.81 MIL/uL   Hemoglobin 9.4 (L) 13.0 - 17.0 g/dL   HCT 28.7 (L) 39.0 - 52.0 %   MCV 92.9 78.0 - 100.0 fL   MCH 30.4 26.0 - 34.0 pg   MCHC 32.8 30.0 - 36.0 g/dL   RDW 15.4 11.5 - 15.5 %   Platelets 116 (L) 150 - 400 K/uL    Comment: CONSISTENT WITH PREVIOUS RESULT Performed at [facility], [place] [address] [place], [place] [address]     Glucose, capillary     Status: Abnormal   Collection Time: 02/26/18  3:42 AM  Result Value Ref Range   Glucose-Capillary 133 (H) 65 - 99 mg/dL  Glucose, capillary     Status: Abnormal   Collection Time: 02/26/18  7:51 AM  Result Value Ref Range   Glucose-Capillary 123 (H) 65 - 99  mg/dL  Glucose, capillary     Status: Abnormal   Collection Time: 02/26/18 12:48 PM  Result Value Ref Range   Glucose-Capillary 141 (H) 65 - 99 mg/dL   Comment 1 Notify RN    Comment 2 Document in Chart   Glucose, capillary     Status: Abnormal   Collection Time: 02/26/18  3:56 PM  Result Value Ref Range   Glucose-Capillary 108 (H) 65 - 99 mg/dL  Glucose, capillary     Status: None   Collection Time: 02/26/18  7:56 PM  Result Value Ref Range   Glucose-Capillary 82 65 - 99 mg/dL  Glucose, capillary     Status: Abnormal   Collection Time: 02/26/18 11:25 PM  Result Value Ref Range   Glucose-Capillary 123 (H) 65 - 99 mg/dL  Glucose, capillary     Status: None   Collection Time: 02/27/18  3:55 AM  Result Value Ref Range   Glucose-Capillary 95 65 - 99 mg/dL  Glucose, capillary     Status: None   Collection Time: 02/27/18  7:30 AM  Result Value Ref Range   Glucose-Capillary 92 65 - 99 mg/dL   Comment 1 Notify RN    Comment 2 Document in Chart   Glucose, capillary     Status: Abnormal   Collection Time: 02/27/18 12:15 PM  Result Value Ref Range   Glucose-Capillary 142 (H) 65 - 99 mg/dL     ImagingResults(Last48hours)  No results found.       Medical Problem List and Plan: 1.  Debility secondary to complication related to pancreatic cancer/left radical nephrectomy 02/19/2018 as well as recent TAVR procedure             -admit to inpatient rehab 2.  DVT Prophylaxis/Anticoagulation: Lovenox.  Monitor for any bleeding episodes 3. Pain Management: Neurontin 200 mg twice daily, baclofen 10 mg 3 times daily as needed, oxycodone as needed 4. Mood: Provide emotional  support 5. Neuropsych: This patient is capable of making decisions on his own behalf. 6. Skin/Wound Care: Routine skin checks 7. Fluids/Electrolytes/Nutrition: Routine in and outs with follow-up chemistries             -stressed importance of adequate po intake for recovery 8.  Acute blood loss anemia.  Follow-up CBC 9.  Diabetes mellitus.  Hemoglobin A1c 8.2.  Lantus insulin 22 units nightly.  Check blood sugars before meals and at bedtime.  Diabetic teaching 10.  Hypertension.  Lisinopril 10 mg daily, Coreg 3.125 mg twice daily. 11.  Hyperlipidemia.  Lipitor 12.  CAD/MI.  Patient on aspirin and Plavix prior to admission and remains on hold after recent surgery.  No chest pain or shortness of breath  Post Admission Physician Evaluation: 1. Functional deficits secondary  to debilty. 2. Patient is admitted to receive collaborative, interdisciplinary care between the physiatrist, rehab nursing staff, and therapy team. 3. Patient's level of medical complexity and substantial therapy needs in context of that medical necessity cannot be provided at a lesser intensity of care such as a SNF. 4. Patient has experienced substantial functional loss from his/her baseline which was documented above under the "Functional History" and "Functional Status" headings.  Judging by the patient's diagnosis, physical exam, and functional history, the patient has potential for functional progress which will result in measurable gains while on inpatient rehab.  These gains will be of substantial and practical use upon discharge  in facilitating mobility and self-care at the household level. 5. Physiatrist will provide 24 hour management of  medical needs as well as oversight of the therapy plan/treatment and provide guidance as appropriate regarding the interaction of the two. 6. The Preadmission Screening has been reviewed and patient status is unchanged unless otherwise stated above. 7. 24 hour rehab nursing will assist  with bladder management, bowel management, safety, skin/wound care, disease management, medication administration, pain management and patient education  and help integrate therapy concepts, techniques,education, etc. 8. PT will assess and treat for/with: Lower extremity strength, range of motion, stamina, balance, functional mobility, safety, adaptive techniques and equipment, community reentry, family ed.   Goals are: mod I. 9. OT will assess and treat for/with: ADL's, functional mobility, safety, upper extremity strength, adaptive techniques and equipment, pain mgt, core muscle strengthening, community reentry.   Goals are: mod I. Therapy may proceed with showering this patient. 10. SLP will assess and treat for/with: n/a.  Goals are: n/a. 11. Case Management and Social Worker will assess and treat for psychological issues and discharge planning. 12. Team conference will be held weekly to assess progress toward goals and to determine barriers to discharge. 13. Patient will receive at least 3 hours of therapy per day at least 5 days per week. 14. ELOS: 5-7 days       15. Prognosis:  excellent   I have personally performed a face to face diagnostic evaluation of this patient and formulated the key components of the plan.  Additionally, I have personally reviewed laboratory data, imaging studies, as well as relevant notes and concur with the physician assistant's documentation above.  Meredith Staggers, MD, FAAPMR   Lavon Paganini [name], PA-C 02/27/2018

## 2018-02-28 ENCOUNTER — Inpatient Hospital Stay (HOSPITAL_COMMUNITY): Payer: Medicare Other | Admitting: Occupational Therapy

## 2018-02-28 ENCOUNTER — Inpatient Hospital Stay (HOSPITAL_COMMUNITY): Payer: Medicare Other | Admitting: Physical Therapy

## 2018-02-28 DIAGNOSIS — Z8507 Personal history of malignant neoplasm of pancreas: Secondary | ICD-10-CM

## 2018-02-28 DIAGNOSIS — D62 Acute posthemorrhagic anemia: Secondary | ICD-10-CM

## 2018-02-28 DIAGNOSIS — E1121 Type 2 diabetes mellitus with diabetic nephropathy: Secondary | ICD-10-CM

## 2018-02-28 LAB — CBC WITH DIFFERENTIAL/PLATELET
Abs Immature Granulocytes: 0.1 10*3/uL (ref 0.0–0.1)
Basophils Absolute: 0 10*3/uL (ref 0.0–0.1)
Basophils Relative: 0 %
Eosinophils Absolute: 0.3 10*3/uL (ref 0.0–0.7)
Eosinophils Relative: 2 %
HCT: 28.9 % — ABNORMAL LOW (ref 39.0–52.0)
Hemoglobin: 9.4 g/dL — ABNORMAL LOW (ref 13.0–17.0)
Immature Granulocytes: 1 %
Lymphocytes Relative: 11 %
Lymphs Abs: 1.1 10*3/uL (ref 0.7–4.0)
MCH: 30.4 pg (ref 26.0–34.0)
MCHC: 32.5 g/dL (ref 30.0–36.0)
MCV: 93.5 fL (ref 78.0–100.0)
Monocytes Absolute: 1.3 10*3/uL — ABNORMAL HIGH (ref 0.1–1.0)
Monocytes Relative: 12 %
Neutro Abs: 7.8 10*3/uL — ABNORMAL HIGH (ref 1.7–7.7)
Neutrophils Relative %: 74 %
Platelets: 137 10*3/uL — ABNORMAL LOW (ref 150–400)
RBC: 3.09 MIL/uL — ABNORMAL LOW (ref 4.22–5.81)
RDW: 14.8 % (ref 11.5–15.5)
WBC: 10.5 10*3/uL (ref 4.0–10.5)

## 2018-02-28 LAB — COMPREHENSIVE METABOLIC PANEL WITH GFR
ALT: 23 U/L (ref 17–63)
AST: 19 U/L (ref 15–41)
Albumin: 2.9 g/dL — ABNORMAL LOW (ref 3.5–5.0)
Alkaline Phosphatase: 73 U/L (ref 38–126)
Anion gap: 6 (ref 5–15)
BUN: 16 mg/dL (ref 6–20)
CO2: 27 mmol/L (ref 22–32)
Calcium: 8.8 mg/dL — ABNORMAL LOW (ref 8.9–10.3)
Chloride: 102 mmol/L (ref 101–111)
Creatinine, Ser: 1.34 mg/dL — ABNORMAL HIGH (ref 0.61–1.24)
GFR calc Af Amer: 59 mL/min — ABNORMAL LOW
GFR calc non Af Amer: 51 mL/min — ABNORMAL LOW
Glucose, Bld: 104 mg/dL — ABNORMAL HIGH (ref 65–99)
Potassium: 3.6 mmol/L (ref 3.5–5.1)
Sodium: 135 mmol/L (ref 135–145)
Total Bilirubin: 1 mg/dL (ref 0.3–1.2)
Total Protein: 5.3 g/dL — ABNORMAL LOW (ref 6.5–8.1)

## 2018-02-28 LAB — GLUCOSE, CAPILLARY
Glucose-Capillary: 101 mg/dL — ABNORMAL HIGH (ref 65–99)
Glucose-Capillary: 105 mg/dL — ABNORMAL HIGH (ref 65–99)
Glucose-Capillary: 117 mg/dL — ABNORMAL HIGH (ref 65–99)
Glucose-Capillary: 168 mg/dL — ABNORMAL HIGH (ref 65–99)
Glucose-Capillary: 86 mg/dL (ref 65–99)

## 2018-02-28 MED ORDER — CLOPIDOGREL BISULFATE 75 MG PO TABS
75.0000 mg | ORAL_TABLET | Freq: Every day | ORAL | Status: DC
  Administered 2018-02-28 – 2018-03-03 (×4): 75 mg via ORAL
  Filled 2018-02-28 (×4): qty 1

## 2018-02-28 NOTE — Progress Notes (Signed)
Meredith Staggers, MD  Physician  Physical Medicine and Rehabilitation  Consult Note  Signed  Date of Service:  02/26/2018 12:58 PM       Related encounter: Admission (Discharged) from 02/19/2018 in [place]      Signed      Expand All Collapse All       Show:Clear all [x] Manual[x] Template[] Copied  Added by: [x] Angiulli, Lavon Paganini, PA-C[x] Meredith Staggers, MD   [] Hover for details        Physical Medicine and Rehabilitation Consult Reason for Consult: Decreased functional mobility Referring Physician: Dr. Barry Dienes   HPI: Manuel Olson is a 72 y.o. right-handed male with history of CAD CVA, diabetes mellitus.  Per chart review patient lives with spouse.  Independent prior to admission.  One level home.  Patient is retired.  Presented 02/19/2018 with recent diagnosis of pancreatic cancer incidentally identified while undergoing work-up for aortic valve surgery.  Patient had TAVR procedure then neoadjuvant treatment.  Also noted left renal cancer diagnosis left renal cancer diagnosis.  Underwent pancreaticuduodenectomy with portal vein resection and placement of biliary and pancreatic duct stents 02/19/2018 per Dr. Barry Dienes as well as open left radical nephrectomy per Dr. Alyson Ingles.  Hospital course anemia.  Patient was transfused.  Later placed on Lovenox for DVT prophylaxis.  Intermittent bouts of confusion that has steadily improved.  Pathology report remains pending.  Diet has slowly been advanced.  Physical and occupational therapy evaluations have been completed.  Patient wax and wane with functional mobility.  Recommendations for physical medicine rehab consult.   Review of Systems  Constitutional: Negative for chills and fever.  HENT: Negative for hearing loss.   Eyes: Negative for blurred vision and double vision.  Respiratory: Positive for shortness of breath. Negative for cough.   Cardiovascular: Positive for palpitations and leg  swelling. Negative for chest pain.  Gastrointestinal: Positive for nausea.       GERD  Genitourinary: Negative for dysuria, flank pain and hematuria.  Musculoskeletal: Positive for myalgias.  Skin: Negative for rash.  All other systems reviewed and are negative.      Past Medical History:  Diagnosis Date  . Arthritis   . CAD (coronary artery disease)    a. 11/2016 in [place]: STEMI s/p DES to mid LAD, DES to diagonal, DES x 2 to proximal and mid RCA   . Essential hypertension   . Former smoker   . GERD (gastroesophageal reflux disease)   . Heart murmur   . Hyperlipidemia   . Myocardial infarction ([place])   . Pancreatic cancer ([place])   . Pneumonia   . Renal mass   . S/P TAVR (transcatheter aortic valve replacement)    a. 08/2017:  Edwards Sapien 3 THV (size 26 mm, model #9600CM26A , serial # [id number])  . Severe aortic stenosis    a. 08/2017: s/p TAVR by Dr. Angelena Form and Dr. Cyndia Bent.   . 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 Type 2 diabetes mellitus ([employer])         Past Surgical History:  Procedure Laterality Date  . APPENDECTOMY    . CARDIAC CATHETERIZATION    . EUS N/A 08/02/2017   Procedure: UPPER ENDOSCOPIC ULTRASOUND (EUS) RADIAL;  Surgeon: [name], MD;  Location: [facility];  Service: Endoscopy;  Laterality: N/A;  . EYE SURGERY Left   . HAND SURGERY Bilateral   . HERNIA REPAIR Right   . LAPAROSCOPY N/A 02/19/2018   Procedure: DIAGNOSTIC  LAPAROSCOPY, ERAS PATHWAY;  Surgeon: [name], MD;  Location: [facility];  Service: General;  Laterality: N/A;  . NEPHRECTOMY Left 02/19/2018   Procedure: OPEN LEFT RADICAL NEPHRECTOMY;  Surgeon: Cleon Gustin, MD;  Location: [facility];  Service: Urology;  Laterality: Left;  . PORTACATH PLACEMENT N/A 09/15/2017   Procedure: INSERTION PORT-A-CATH;  Surgeon: [name], MD;  Location: [facility];  Service: General;  Laterality: N/A;  . RIGHT/LEFT HEART CATH AND CORONARY ANGIOGRAPHY N/A  07/05/2017   Procedure: RIGHT/LEFT HEART CATH AND CORONARY ANGIOGRAPHY;  Surgeon: [name], MD;  Location: [facility];  Service: Cardiovascular;  Laterality: N/A;  . SHOULDER ARTHROSCOPY WITH ROTATOR CUFF REPAIR Left   . TEE WITHOUT CARDIOVERSION N/A 08/22/2017   Procedure: TRANSESOPHAGEAL ECHOCARDIOGRAM (TEE);  Surgeon: Burnell Blanks, MD;  Location: [facility];  Service: Open Heart Surgery;  Laterality: N/A;  . TRANSCATHETER AORTIC VALVE REPLACEMENT, TRANSFEMORAL N/A 08/22/2017   Procedure: TRANSCATHETER AORTIC VALVE REPLACEMENT, TRANSFEMORAL;  Surgeon: Burnell Blanks, MD;  Location: [facility];  Service: Open Heart Surgery;  Laterality: N/A;  . WHIPPLE PROCEDURE N/A 02/19/2018   Procedure: WHIPPLE PROCEDURE;  Surgeon: [name], MD;  Location: [facility] OR;  Service: General;  Laterality: N/A;        Family History  Problem Relation Age of Onset  . Lupus Mother   . CAD Brother   . Hypertension Brother    Social History:  reports that he quit smoking about 22 months ago. His smoking use included cigarettes. He started smoking about 59 years ago. He has a 57.00 pack-year smoking history. He has never used smokeless tobacco. He reports that he does not drink alcohol or use drugs. Allergies: No Known Allergies       Medications Prior to Admission  Medication Sig Dispense Refill  . aspirin EC 81 MG tablet Take 1 tablet (81 mg total) by mouth daily. (Patient taking differently: Take 81 mg by mouth daily at 2 PM. ) 90 tablet 3  . atorvastatin (LIPITOR) 20 MG tablet Take 1 tablet (20 mg total) by mouth daily. 90 tablet 1  . carvedilol (COREG) 3.125 MG tablet TAKE (1) TABLET BY MOUTH TWICE A DAY WITH MEALS (BREAKFAST AND SUPPER) 180 tablet 2  . clopidogrel (PLAVIX) 75 MG tablet Take 1 tablet (75 mg total) by mouth daily. (Patient taking differently: Take 75 mg by mouth daily with breakfast. ) 90 tablet 1  . Insulin Glargine (BASAGLAR KWIKPEN) 100 UNIT/ML SOPN Inject  0.2 mLs (20 Units total) into the skin at bedtime. 5 pen 5  . insulin lispro (HUMALOG) 100 UNIT/ML injection Inject 0.04-0.06 mLs (4-6 Units total) into the skin 3 (three) times daily before meals. Pens. (Patient taking differently: Inject 6 Units into the skin 2 (two) times daily before a meal. Breakfast & supper) 15 mL 11  . lidocaine-prilocaine (EMLA) cream Apply 1 application topically as needed. Apply small amount of cream over port site 1-2 hrs prior to chemo, cover with plastic wrap. (Patient taking differently: Apply 1 application topically as needed (for recieving chemo in port site). Apply small amount of cream over port site 1-2 hrs prior to chemo, cover with plastic wrap.) 30 g 0  . lisinopril (PRINIVIL,ZESTRIL) 10 MG tablet Take 1 tablet (10 mg total) by mouth daily. 90 tablet 2  . baclofen (LIORESAL) 10 MG tablet Take 0.5 tablets (5 mg total) by mouth 3 (three) times daily as needed for muscle spasms. 30 each 0  . diphenoxylate-atropine (LOMOTIL)  2.5-0.025 MG tablet Take 2 tablets by mouth 4 (four) times daily as needed for diarrhea or loose stools. 60 tablet 0  . Insulin Pen Needle (CAREFINE PEN NEEDLES) 32G X 4 MM MISC Use 1x a day 100 each 3  . ondansetron (ZOFRAN) 8 MG tablet Take 1 tablet (8 mg total) by mouth every 8 (eight) hours as needed for nausea or vomiting. 20 tablet 0  . prochlorperazine (COMPAZINE) 5 MG tablet Take 1 tablet (5 mg total) by mouth every 6 (six) hours as needed for nausea or vomiting. 30 tablet 0    Home: Home Living Family/patient expects to be discharged to:: Private residence Living Arrangements: Spouse/significant other Available Help at Discharge: Family, Available 24 hours/day Type of Home: House Home Access: Stairs to enter [employer] of Steps: 3(small) Entrance Stairs-Rails: None(something there to pull on, but not a formal rail. ) Home Layout: One level Bathroom Shower/Tub: Chiropodist: Standard Bathroom  Accessibility: Yes Home Equipment: None  Functional History: Prior Function Level of Independence: Independent Comments: drives, retired Functional Status:  Mobility: Bed Mobility Overal bed mobility: Needs Assistance Bed Mobility: Sit to Supine Rolling: Min assist Sidelying to sit: Min assist Sit to supine: HOB elevated, Supervision General bed mobility comments: Flattened HOB as pt reports he has a flat bed at home.  Verbal cues and manual assist for log roll to protect his abdomen.  Transfers Overall transfer level: Needs assistance Equipment used: Rolling walker (2 wheeled) Transfers: Sit to/from Stand Sit to Stand: Min assist General transfer comment: no physical assist needed Ambulation/Gait Ambulation/Gait assistance: Mod assist Gait Distance (Feet): 15 Feet Assistive device: Rolling walker (2 wheeled) Gait Pattern/deviations: Step-through pattern, Shuffle, Trunk flexed General Gait Details: Pt needed up to mod assist during gait as he kept closing his eyes.  Assist at times for balance and to keep the RW from getting too far away from him.    ADL: ADL Overall ADL's : Needs assistance/impaired Grooming: Set up, Supervision/safety, Sitting Upper Body Bathing: Set up, Supervision/ safety, Sitting Lower Body Bathing: Minimal assistance, Sit to/from stand Upper Body Dressing : Minimal assistance, Sitting Lower Body Dressing: Sit to/from stand, Minimal assistance Toilet Transfer: Minimal assistance, RW, Ambulation Toileting- Clothing Manipulation and Hygiene: Minimal assistance(to steady pt) Functional mobility during ADLs: Minimal assistance, Rolling walker, Cueing for safety, Cueing for sequencing General ADL Comments: impulsive and unsafe during mobility; wife very involved with care  Cognition: Cognition Overall Cognitive Status: Impaired/Different from baseline Orientation Level: Oriented X4 Cognition Arousal/Alertness: Lethargic, Suspect due to  medications Behavior During Therapy: Impulsive Overall Cognitive Status: Impaired/Different from baseline Area of Impairment: Memory, Problem solving, Following commands, Safety/judgement, Awareness Memory: Decreased recall of precautions, Decreased short-term memory Following Commands: Follows one step commands with increased time Safety/Judgement: Decreased awareness of safety, Decreased awareness of deficits Awareness: Emergent Problem Solving: Slow processing, Decreased initiation, Difficulty sequencing, Requires verbal cues, Requires tactile cues General Comments: Pt had pain meds prior to session adn wife states he "gets like this with pain meds"  Blood pressure (!) 150/87, pulse 80, temperature 98.8 F (37.1 C), resp. rate 16, weight 73.9 kg (162 lb 14.7 oz), SpO2 95 %. Physical Exam  Neurological:  Patient sitting up in chair.  Alert and oriented to person and place but could not recall his full hospital stay.             Assessment/Plan: Diagnosis: debility after complications related to pancreatic cancer, multiple surgeries, recent TAVR 1. Does the need for close, 24  hr/day medical supervision in concert with the patient's rehab needs make it unreasonable for this patient to be served in a less intensive setting? Yes 2. Co-Morbidities requiring supervision/potential complications: HTN, post-op considerations 3. Due to bladder management, bowel management, safety, skin/wound care, disease management, medication administration, pain management and patient education, does the patient require 24 hr/day rehab nursing? Yes 4. Does the patient require coordinated care of a physician, rehab nurse, PT (1-2 hrs/day, 5 days/week) and OT (1-2 hrs/day, 5 days/week) to address physical and functional deficits in the context of the above medical diagnosis(es)? Yes Addressing deficits in the following areas: balance, endurance, locomotion, strength, transferring, bowel/bladder control,  bathing, dressing, feeding, grooming, toileting and psychosocial support 5. Can the patient actively participate in an intensive therapy program of at least 3 hrs of therapy per day at least 5 days per week? Yes 6. The potential for patient to make measurable gains while on inpatient rehab is excellent 7. Anticipated functional outcomes upon discharge from inpatient rehab are modified independent and supervision  with PT, modified independent and supervision with OT, modified independent and supervision with SLP. 8. Estimated rehab length of stay to reach the above functional goals is: 8-12 days 9. Anticipated D/C setting: Home 10. Anticipated post D/C treatments: HH therapy and Outpatient therapy 11. Overall Rehab/Functional Prognosis: excellent  RECOMMENDATIONS: This patient's condition is appropriate for continued rehabilitative care in the following setting: CIR Patient has agreed to participate in recommended program. Yes Note that insurance prior authorization may be required for reimbursement for recommended care.  Comment: Rehab Admissions Coordinator to follow up.  Thanks,  Meredith Staggers, MD, Mellody Drown     Lavon Paganini Angiulli, PA-C 02/26/2018          Revision History                   Routing History

## 2018-02-28 NOTE — Care Management Note (Signed)
[facility] Individual Statement of Services  Patient Name:  Manuel Olson  Date:  02/28/2018  Welcome to the [facility].  Our goal is to provide you with an individualized program based on your diagnosis and situation, designed to meet your specific needs.  With this comprehensive rehabilitation program, you will be expected to participate in at least 3 hours of rehabilitation therapies Monday-Friday, with modified therapy programming on the weekends.  Your rehabilitation program will include the following services:  Physical Therapy (PT), Occupational Therapy (OT), 24 hour per day rehabilitation nursing, Case Management (Social Worker), Rehabilitation Medicine, Nutrition Services and Pharmacy Services  Weekly team conferences will be held on Wednesday to discuss your progress.  Your Social Worker will talk with you frequently to get your input and to update you on team discussions.  Team conferences with you and your family in attendance may also be held.  Expected length of stay: 5 days  Overall anticipated outcome: mod/i level  Depending on your progress and recovery, your program may change. Your Social Worker will coordinate services and will keep you informed of any changes. Your Social Worker's name and contact numbers are listed  below.  The following services may also be recommended but are not provided by the [facility] will be made to provide these services after discharge if needed.  Arrangements include referral to agencies that provide these services.  Your insurance has been verified to be:  [employer] Your primary doctor is:    Pertinent information will be shared with your doctor and your insurance company.  Social Worker:  Ovidio Kin, [place] or ([phone]  Information  discussed with and copy given to patient by: [name], 02/28/2018, 9:48 AM

## 2018-02-28 NOTE — Evaluation (Signed)
Occupational Therapy Assessment and Plan  Patient Details  Name: Manuel Olson MRN: [id number] Date of Birth: [birth year]  OT Diagnosis: muscle weakness (generalized) Rehab Potential: Rehab Potential (ACUTE ONLY): Excellent ELOS: 3-5 days   Today's Date: 02/28/2018 OT Individual Time: 0623-7628 OT Individual Time Calculation (min): 70 min     Problem List:  Patient Active Problem List   Diagnosis Date Noted  . Debility 02/27/2018  . S/P laparoscopy 02/19/2018  . Adenocarcinoma of head of pancreas ([place]) 02/19/2018  . Port-A-Cath in place 10/10/2017  . Renal cell carcinoma of right kidney ([place]) 10/02/2017  . Malnutrition of moderate degree 09/28/2017  . Anemia due to antineoplastic chemotherapy 09/28/2017  . Diffuse abdominal pain 09/28/2017  . Ileitis, terminal ([place]) 09/28/2017  . Hyponatremia 09/27/2017  . Dehydration 09/26/2017  . FTT (failure to thrive) in adult 09/26/2017  . Chemotherapy-induced nausea   . Hypokalemia   . Hypomagnesemia   . Poorly controlled type 2 diabetes mellitus with circulatory disorder ([place]) 08/25/2017  . Mixed hyperlipidemia 08/25/2017  . Essential hypertension, benign   . Pancreatic cancer ([place])   . Renal mass   . CAD (coronary artery disease)   . Former smoker   . S/P TAVR (transcatheter aortic valve replacement) 08/22/2017  . Cancer of head of pancreas ([place]) 08/10/2017  . Severe aortic stenosis 07/05/2017    Past Medical History:  Past Medical History:  Diagnosis Date  . Arthritis   . CAD (coronary artery disease)    a. 11/2016 in [place]: STEMI s/p DES to mid LAD, DES to diagonal, DES x 2 to proximal and mid RCA   . Essential hypertension   . Former smoker   . GERD (gastroesophageal reflux disease)   . Heart murmur   . Hyperlipidemia   . Myocardial infarction ([place])   . Pancreatic cancer ([place])   . Pneumonia   . Renal mass   . S/P TAVR (transcatheter aortic valve replacement)    a. 08/2017:  Edwards Sapien 3 THV (size 26 mm, model  #9600CM26A , serial # [id number])  . Severe aortic stenosis    a. 08/2017: s/p TAVR by Dr. Angelena Form and Dr. Cyndia Bent.   . 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 Type 2 diabetes mellitus ([place])    Past Surgical History:  Past Surgical History:  Procedure Laterality Date  . APPENDECTOMY    . CARDIAC CATHETERIZATION    . EUS N/A 08/02/2017   Procedure: UPPER ENDOSCOPIC ULTRASOUND (EUS) RADIAL;  Surgeon: [name], MD;  Location: [facility];  Service: Endoscopy;  Laterality: N/A;  . EYE SURGERY Left   . HAND SURGERY Bilateral   . HERNIA REPAIR Right   . LAPAROSCOPY N/A 02/19/2018   Procedure: DIAGNOSTIC LAPAROSCOPY, ERAS PATHWAY;  Surgeon: [name], MD;  Location: [place];  Service: General;  Laterality: N/A;  . NEPHRECTOMY Left 02/19/2018   Procedure: OPEN LEFT RADICAL NEPHRECTOMY;  Surgeon: Cleon Gustin, MD;  Location: [place];  Service: Urology;  Laterality: Left;  . PORTACATH PLACEMENT N/A 09/15/2017   Procedure: INSERTION PORT-A-CATH;  Surgeon: [name], MD;  Location: [place];  Service: General;  Laterality: N/A;  . RIGHT/LEFT HEART CATH AND CORONARY ANGIOGRAPHY N/A 07/05/2017   Procedure: RIGHT/LEFT HEART CATH AND CORONARY ANGIOGRAPHY;  Surgeon: [name], MD;  Location: [place] [facility];  Service: Cardiovascular;  Laterality: N/A;  . SHOULDER ARTHROSCOPY WITH ROTATOR CUFF REPAIR Left   . TEE WITHOUT CARDIOVERSION N/A 08/22/2017   Procedure:  TRANSESOPHAGEAL ECHOCARDIOGRAM (TEE);  Surgeon: Burnell Blanks, MD;  Location: [place];  Service: Open Heart Surgery;  Laterality: N/A;  . TRANSCATHETER AORTIC VALVE REPLACEMENT, TRANSFEMORAL N/A 08/22/2017   Procedure: TRANSCATHETER AORTIC VALVE REPLACEMENT, TRANSFEMORAL;  Surgeon: Burnell Blanks, MD;  Location: [place];  Service: Open Heart Surgery;  Laterality: N/A;  . WHIPPLE PROCEDURE N/A 02/19/2018   Procedure: WHIPPLE PROCEDURE;  Surgeon: [name], MD;  Location: [place];  Service:  General;  Laterality: N/A;    Assessment & Plan Clinical Impression: Manuel Olson is a 72 year old right-handed male with history of CAD/MI and was on aspirin and Plavix, CVA, diabetes mellitus, former smoker.  Per chart review patient lives with spouse.  Independent prior to admission.  One level home.  He is retired.  Presented 02/19/2018 with recent diagnosis of pancreatic cancer incidentally identified while undergoing work-up for aortic valve surgery.  Patient had TAVR procedure then neoadjuvant treatment as well as left renal cancer.  Underwent pancreaticuduodenectomy with portal vein resection and placement of biliary and pancreatic duct stents 02/19/2018 per Dr. Barry Dienes as well as open left radical nephrectomy by Dr. Alyson Ingles.  Hospital course anemia patient was transfused.  Later placed on Lovenox for DVT prophylaxis.  Intermittent bouts of confusion that steadily improved.  Pathology report remains pending.  Diet has slowly been advanced.  Physical and occupational therapy evaluations completed with recommendations of physical medicine rehab consult.  Patient was admitted for a comprehensive rehab program.   Patient transferred to CIR on 02/27/2018 .    Patient currently requires supervision with basic self-care skills secondary to decreased cardiorespiratoy endurance and decreased standing balance.  Prior to hospitalization, patient was fully independent and driving.  Patient will benefit from skilled intervention to increase independence with basic self-care skills prior to discharge home with care partner.  Anticipate patient will require intermittent supervision and no further OT follow recommended.  OT - End of Session Endurance Deficit: Yes OT Assessment Rehab Potential (ACUTE ONLY): Excellent OT Patient demonstrates impairments in the following area(s): Balance;Endurance OT Basic ADL's Functional Problem(s): Bathing;Dressing;Toileting OT Transfers Functional Problem(s):  Toilet;Tub/Shower OT Additional Impairment(s): None OT Plan OT Intensity: Minimum of 1-2 x/day, 45 to 90 minutes OT Frequency: 5 out of 7 days OT Duration/Estimated Length of Stay: 3-5 days OT Treatment/Interventions: Balance/vestibular training;Discharge planning;DME/adaptive equipment instruction;Functional mobility training;Patient/family education;Self Care/advanced ADL retraining;Therapeutic Activities;Therapeutic Exercise OT Self Feeding Anticipated Outcome(s): no goal - pt is I OT Basic Self-Care Anticipated Outcome(s): mod I OT Toileting Anticipated Outcome(s): no goal - pt is mod I now OT Bathroom Transfers Anticipated Outcome(s): mod I OT Recommendation Patient destination: Home Follow Up Recommendations: None Equipment Recommended: 3 in 1 bedside comode;Tub/shower seat   Skilled Therapeutic Intervention Pt seen for initial evaluation and ADL retraining along with pt and family education.  Discussed role of OT and pt's goals. Pt stated he is now feeling great and feels as though he can do everything as he actually feels stronger than when he went through his chemo treatment.  Pt discussed at length the recent medical procedures demonstrating clear thinking and memory.   Pt ambulated to the toilet with S and then completed toileting with mod I.  He was eager to shower. Transferred into shower with S and cued to sit for most of the shower to conserve energy although he wanted to stand.  He followed recommendations and was able to bathe seated except for standing to wash bottom. Pt completed shower and then returned to EOB to  dress.    He stated that he frequently has bowel/ bladder urgency and would like more freedom in the room. His wife practiced walking with pt in room and she was cleared on the safety plan to assist him as needed.  Reminded pt to call for assist whenever getting up when his wife was not in the room.   Discussed DME and that a BSC would be a good idea to have at home  if he has urgency at night and a shower seat for the tub.  Pt resting in room with all needs met.  OT Evaluation Precautions/Restrictions  Precautions Precautions: Fall Restrictions Weight Bearing Restrictions: No    Pain Pain Assessment Pain Scale: 0-10 Pain Score: 0-No pain Home Living/Prior Functioning Home Living Family/patient expects to be discharged to:: Private residence Living Arrangements: Spouse/significant other Available Help at Discharge: Family, Available 24 hours/day Type of Home: House Home Access: Stairs to enter [employer] of Steps: 3(small) Entrance Stairs-Rails: None(something there to pull on, but not a formal rail. ) Home Layout: One level Bathroom Shower/Tub: Government social research officer Accessibility: Yes  Lives With: Spouse Prior Function Level of Independence: Independent with basic ADLs  Able to Take Stairs?: Yes Driving: Yes Vocation: Retired Comments: drives, retired ADL ADL ADL Comments: Supervision overall, mod I toileting Vision Baseline Vision/History: Wears glasses Wears Glasses: At all times Patient Visual Report: No change from baseline Vision Assessment?: No apparent visual deficits Perception  Perception: Within Functional Limits Praxis Praxis: Intact Cognition Overall Cognitive Status: Within Functional Limits for tasks assessed Arousal/Alertness: Awake/alert Orientation Level: Person;Place;Situation Person: Oriented Place: Oriented Situation: Oriented Year: 2019 Month: June Day of Week: Correct Memory: Appears intact Immediate Memory Recall: Sock;Blue;Bed Memory Recall: Sock;Blue;Bed Memory Recall Sock: With Cue Memory Recall Blue: Without Cue Memory Recall Bed: Without Cue Awareness: Appears intact Problem Solving: Appears intact Safety/Judgment: Appears intact Sensation Sensation Hot/Cold: Appears Intact Proprioception: Appears Intact Motor   WFL Mobility    S with  transfers/ ambulation Trunk/Postural Assessment  Cervical Assessment Cervical Assessment: Within Functional Limits Thoracic Assessment Thoracic Assessment: Within Functional Limits Lumbar Assessment Lumbar Assessment: Within Functional Limits Postural Control Postural Control: Within Functional Limits  Balance Balance Balance Assessed: Yes Berg Balance Test Sit to Stand: Able to stand without using hands and stabilize independently Standing Unsupported: Able to stand safely 2 minutes Sitting with Back Unsupported but Feet Supported on Floor or Stool: Able to sit safely and securely 2 minutes Stand to Sit: Sits safely with minimal use of hands Transfers: Able to transfer safely, minor use of hands Standing Unsupported with Eyes Closed: Able to stand 10 seconds with supervision Standing Ubsupported with Feet Together: Able to place feet together independently and stand for 1 minute with supervision From Standing, Reach Forward with Outstretched Arm: Can reach confidently >25 cm (10") From Standing Position, Pick up Object from Floor: Able to pick up shoe safely and easily From Standing Position, Turn to Look Behind Over each Shoulder: Looks behind from both sides and weight shifts well Turn 360 Degrees: Able to turn 360 degrees safely in 4 seconds or less Standing Unsupported, Alternately Place Feet on Step/Stool: Able to stand independently and safely and complete 8 steps in 20 seconds Standing Unsupported, One Foot in Front: Able to plae foot ahead of the other independently and hold 30 seconds Standing on One Leg: Tries to lift leg/unable to hold 3 seconds but remains standing independently Total Score: 50 Dynamic Sitting Balance Dynamic Sitting - Level of  Assistance: 6: Modified independent (Device/Increase time) Static Standing Balance Static Standing - Level of Assistance: 6: Modified independent (Device/Increase time) Dynamic Standing Balance Dynamic Standing - Level of  Assistance: 5: Stand by assistance Extremity/Trunk Assessment RUE Assessment RUE Assessment: Within Functional Limits LUE Assessment LUE Assessment: Within Functional Limits   See Function Navigator for Current Functional Status.   Refer to Care Plan for Long Term Goals  Recommendations for other services: None    Discharge Criteria: Patient will be discharged from OT if patient refuses treatment 3 consecutive times without medical reason, if treatment goals not met, if there is a change in medical status, if patient makes no progress towards goals or if patient is discharged from hospital.  The above assessment, treatment plan, treatment alternatives and goals were discussed and mutually agreed upon: by patient and by family  SAGUIER,JULIA 02/28/2018, 12:46 PM

## 2018-02-28 NOTE — Evaluation (Signed)
Physical Therapy Assessment and Plan  Patient Details  Name: Manuel Olson MRN: [id number] Date of Birth: 1945-10-16   PT Diagnosis: Difficulty walking and Muscle weakness Rehab Potential: Excellent ELOS: 3-5 days    Today's Date: 02/28/2018 PT Individual Time: 0805-0905 PT Individual Time Calculation (min): 60 min    Problem List:  Patient Active Problem List   Diagnosis Date Noted  . Debility 02/27/2018  . S/P laparoscopy 02/19/2018  . Adenocarcinoma of head of pancreas ([place]) 02/19/2018  . Port-A-Cath in place 10/10/2017  . Renal cell carcinoma of right kidney ([place]) 10/02/2017  . Malnutrition of moderate degree 09/28/2017  . Anemia due to antineoplastic chemotherapy 09/28/2017  . Diffuse abdominal pain 09/28/2017  . Ileitis, terminal ([place]) 09/28/2017  . Hyponatremia 09/27/2017  . Dehydration 09/26/2017  . FTT (failure to thrive) in adult 09/26/2017  . Chemotherapy-induced nausea   . Hypokalemia   . Hypomagnesemia   . Poorly controlled type 2 diabetes mellitus with circulatory disorder ([place]) 08/25/2017  . Mixed hyperlipidemia 08/25/2017  . Essential hypertension, benign   . Pancreatic cancer ([place])   . Renal mass   . CAD (coronary artery disease)   . Former smoker   . S/P TAVR (transcatheter aortic valve replacement) 08/22/2017  . Cancer of head of pancreas ([place]) 08/10/2017  . Severe aortic stenosis 07/05/2017    Past Medical History:  Past Medical History:  Diagnosis Date  . Arthritis   . CAD (coronary artery disease)    a. 11/2016 in [place]: STEMI s/p DES to mid LAD, DES to diagonal, DES x 2 to proximal and mid RCA   . Essential hypertension   . Former smoker   . GERD (gastroesophageal reflux disease)   . Heart murmur   . Hyperlipidemia   . Myocardial infarction ([place])   . Pancreatic cancer ([place])   . Pneumonia   . Renal mass   . S/P TAVR (transcatheter aortic valve replacement)    a. 08/2017:  Edwards Sapien 3 THV (size 26 mm, model #9600CM26A , serial #  [id number])  . Severe aortic stenosis    a. 08/2017: s/p TAVR by Dr. Angelena Form and Dr. Cyndia Bent.   . 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 Type 2 diabetes mellitus ([place])    Past Surgical History:  Past Surgical History:  Procedure Laterality Date  . APPENDECTOMY    . CARDIAC CATHETERIZATION    . EUS N/A 08/02/2017   Procedure: UPPER ENDOSCOPIC ULTRASOUND (EUS) RADIAL;  Surgeon: [name], MD;  Location: [facility];  Service: Endoscopy;  Laterality: N/A;  . EYE SURGERY Left   . HAND SURGERY Bilateral   . HERNIA REPAIR Right   . LAPAROSCOPY N/A 02/19/2018   Procedure: DIAGNOSTIC LAPAROSCOPY, ERAS PATHWAY;  Surgeon: [name], MD;  Location: [place];  Service: General;  Laterality: N/A;  . NEPHRECTOMY Left 02/19/2018   Procedure: OPEN LEFT RADICAL NEPHRECTOMY;  Surgeon: Cleon Gustin, MD;  Location: [place];  Service: Urology;  Laterality: Left;  . PORTACATH PLACEMENT N/A 09/15/2017   Procedure: INSERTION PORT-A-CATH;  Surgeon: [name], MD;  Location: [place];  Service: General;  Laterality: N/A;  . RIGHT/LEFT HEART CATH AND CORONARY ANGIOGRAPHY N/A 07/05/2017   Procedure: RIGHT/LEFT HEART CATH AND CORONARY ANGIOGRAPHY;  Surgeon: [name], MD;  Location: [place] [facility];  Service: Cardiovascular;  Laterality: N/A;  . SHOULDER ARTHROSCOPY WITH ROTATOR CUFF REPAIR Left   . TEE WITHOUT CARDIOVERSION N/A 08/22/2017   Procedure: TRANSESOPHAGEAL  ECHOCARDIOGRAM (TEE);  Surgeon: Burnell Blanks, MD;  Location: [place];  Service: Open Heart Surgery;  Laterality: N/A;  . TRANSCATHETER AORTIC VALVE REPLACEMENT, TRANSFEMORAL N/A 08/22/2017   Procedure: TRANSCATHETER AORTIC VALVE REPLACEMENT, TRANSFEMORAL;  Surgeon: Burnell Blanks, MD;  Location: [place];  Service: Open Heart Surgery;  Laterality: N/A;  . WHIPPLE PROCEDURE N/A 02/19/2018   Procedure: WHIPPLE PROCEDURE;  Surgeon: [name], MD;  Location: [place];  Service: General;  Laterality:  N/A;    Assessment & Plan Clinical Impression: Patient is a 72 year old right-handed male with history of CAD/MI and was on aspirin and Plavix, CVA, diabetes mellitus, former smoker. Per chart review patient lives with spouse. Independent prior to admission. One level home. He is retired. Presented 02/19/2018 with recent diagnosis of pancreatic cancer incidentally identified while undergoing work-up for aortic valve surgery. Patient had TAVR procedure then neoadjuvanttreatment as well as left renal cancer. Underwent pancreaticuduodenectomywith portal vein resection and placement of biliary and pancreatic duct stents 02/19/2018 per Dr. Barry Dienes as well as open left radical nephrectomy by Dr. Alyson Ingles. Hospital course anemia patient was transfused. Later placed on Lovenox for DVT prophylaxis. Intermittent bouts of confusion that steadily improved. Pathology report remains pending. Diet has slowly been advanced. Physical and occupational therapy evaluations completed with recommendations of physical medicine rehab consult.     Patient transferred to CIR on 02/27/2018 .   Patient currently requires supervision with mobility secondary to muscle weakness, decreased cardiorespiratoy endurance and decreased standing balance and decreased balance strategies.  Prior to hospitalization, patient was independent  with mobility and lived with Spouse in a House home.  Home access is 3(small)Stairs to enter.  Patient will benefit from skilled PT intervention to maximize safe functional mobility, minimize fall risk and decrease caregiver burden for planned discharge home with intermittent assist.  Anticipate patient will benefit from follow up OP at discharge.  PT - End of Session Activity Tolerance: Tolerates 30+ min activity with multiple rests Endurance Deficit: Yes PT Assessment Rehab Potential (ACUTE/IP ONLY): Excellent PT Barriers to Discharge: Inaccessible home environment;Medical stability;Pending  chemo/radiation PT Patient demonstrates impairments in the following area(s): Balance;Endurance;Pain;Skin Integrity PT Transfers Functional Problem(s): Bed to Chair;Car;Furniture;Floor PT Locomotion Functional Problem(s): Ambulation;Stairs PT Plan PT Intensity: Minimum of 1-2 x/day ,45 to 90 minutes PT Frequency: 5 out of 7 days PT Duration Estimated Length of Stay: 3-5 days  PT Treatment/Interventions: Ambulation/gait training;Balance/vestibular training;Community reintegration;Discharge planning;Disease management/prevention;DME/adaptive equipment instruction;Functional mobility training;Neuromuscular re-education;Pain management;Patient/family education;Psychosocial support;Therapeutic Exercise;Splinting/orthotics;Skin care/wound management;Therapeutic Activities;Stair training;UE/LE Strength taining/ROM;UE/LE Coordination activities;Visual/perceptual remediation/compensation;Wheelchair propulsion/positioning PT Transfers Anticipated Outcome(s): Mod I  PT Locomotion Anticipated Outcome(s): Ambulatory at mod I level  PT Recommendation Follow Up Recommendations: Outpatient PT Patient destination: Home Equipment Recommended: To be determined  Skilled Therapeutic Intervention Pt received sitting in WC and agreeable to PT. PT instructed patient in PT Evaluation and initiated treatment intervention; see below for results. PT educated patient in [place], rehab potential, rehab goals, and discharge recommendations. PT instructed pt in berg balance test. Patient demonstrates increased fall risk as noted by score of   /56 on Berg Balance Scale.  (<36= high risk for falls, close to 100%; 37-45 significant >80%; 46-51 moderate >50%; 52-55 lower >25%). In addition al basic transfer training pt also instructed in car transfer with supervision assist. Supervision assist overall for gait training including level surface as listed below and over unlevel surface of ramp and unlevel mulched surface. Patient returned to  room and left sitting in [facility] with call bell in reach  and all needs met.      PT Evaluation Precautions/Restrictions   General   Vital SignsTherapy Vitals Pulse Rate: 85 BP: (!) 172/76 Patient Position (if appropriate): Sitting(following gait ) Pain Pain Assessment Pain Scale: 0-10 Pain Score: 0-No pain Home Living/Prior Functioning Home Living Living Arrangements: Spouse/significant other Available Help at Discharge: Family;Available 24 hours/day Type of Home: House Home Access: Stairs to enter [employer] of Steps: 3(small) Entrance Stairs-Rails: None(something there to pull on, but not a formal rail. ) Home Layout: One level Bathroom Shower/Tub: Optometrist: Yes  Lives With: Spouse Prior Function Level of Independence: Independent with basic ADLs  Able to Take Stairs?: Yes Driving: Yes Vocation: Retired Comments: drives, retired Art gallery manager: Within [place]: Intact  Cognition Overall Cognitive Status: Within Functional Limits for tasks assessed Arousal/Alertness: Awake/alert Memory: Appears intact Awareness: Appears intact Problem Solving: Appears intact Safety/Judgment: Appears intact Sensation Sensation Light Touch: Appears Intact Stereognosis: Appears Intact Coordination Gross Motor Movements are Fluid and Coordinated: Yes Fine Motor Movements are Fluid and Coordinated: Yes Heel Shin Test: [facility] Motor  Motor Motor: Within Functional Limits  Mobility Bed Mobility Bed Mobility: Rolling Right;Supine to Sit;Sit to Supine;Rolling Left Rolling Right: Supervision/verbal cueing Rolling Left: Minimal Assistance - Patient > 75% Supine to Sit: Supervision/Verbal cueing Sit to Supine: Supervision/Verbal cueing Transfers Transfers: Sit to Stand;Stand Pivot Transfers Sit to Stand: Supervision/Verbal cueing Stand Pivot Transfers: Supervision/Verbal  cueing Stand Pivot Transfer Details: Verbal cues for precautions/safety Transfer (Assistive device): None Locomotion  Gait Ambulation: Yes Gait Distance (Feet): 200 Feet Assistive device: None Gait Gait: Yes Gait Pattern: Impaired Gait Pattern: Poor foot clearance - right Stairs / Additional Locomotion Stairs: Yes Stairs Assistance: Supervision/Verbal cueing Stair Management Technique: One rail Right Number of Stairs: 12 Height of Stairs: 6 Wheelchair Mobility Wheelchair Mobility: Yes Wheelchair Assistance: Chartered loss adjuster: Both upper extremities Wheelchair Parts Management: Supervision/cueing Distance: 198f  Trunk/Postural Assessment  Cervical Assessment Cervical Assessment: Within Functional Limits Thoracic Assessment Thoracic Assessment: Within Functional Limits Lumbar Assessment Lumbar Assessment: Within Functional Limits Postural Control Postural Control: Within Functional Limits  Balance Balance Balance Assessed: Yes Berg Balance Test Sit to Stand: Able to stand without using hands and stabilize independently Standing Unsupported: Able to stand safely 2 minutes Sitting with Back Unsupported but Feet Supported on Floor or Stool: Able to sit safely and securely 2 minutes Stand to Sit: Sits safely with minimal use of hands Transfers: Able to transfer safely, minor use of hands Standing Unsupported with Eyes Closed: Able to stand 10 seconds with supervision Standing Ubsupported with Feet Together: Able to place feet together independently and stand for 1 minute with supervision From Standing, Reach Forward with Outstretched Arm: Can reach confidently >25 cm (10") From Standing Position, Pick up Object from Floor: Able to pick up shoe safely and easily From Standing Position, Turn to Look Behind Over each Shoulder: Looks behind from both sides and weight shifts well Turn 360 Degrees: Able to turn 360 degrees safely in 4 seconds or  less Standing Unsupported, Alternately Place Feet on Step/Stool: Able to stand independently and safely and complete 8 steps in 20 seconds Standing Unsupported, One Foot in Front: Able to plae foot ahead of the other independently and hold 30 seconds Standing on One Leg: Tries to lift leg/unable to hold 3 seconds but remains standing independently Total Score: 50 Dynamic Sitting Balance Dynamic Sitting - Level of Assistance: 6: Modified independent (Device/Increase time) Static Standing  Balance Static Standing - Level of Assistance: 6: Modified independent (Device/Increase time) Dynamic Standing Balance Dynamic Standing - Level of Assistance: 5: Stand by assistance Extremity Assessment      RLE Assessment RLE Assessment: Within Functional Limits LLE Assessment LLE Assessment: Within Functional Limits   See Function Navigator for Current Functional Status.   Refer to Care Plan for Long Term Goals  Recommendations for other services: Therapeutic Recreation  Stress management and Outing/community reintegration  Discharge Criteria: Patient will be discharged from PT if patient refuses treatment 3 consecutive times without medical reason, if treatment goals not met, if there is a change in medical status, if patient makes no progress towards goals or if patient is discharged from hospital.  The above assessment, treatment plan, treatment alternatives and goals were discussed and mutually agreed upon: by patient  Lorie Phenix 02/28/2018, 10:53 AM

## 2018-02-28 NOTE — Patient Care Conference (Signed)
Inpatient RehabilitationTeam Conference and Plan of Care Update Date: 02/28/2018   Time: 11:30 AM    Patient Name: Manuel Olson      Medical Record Number: [id number]  Date of Birth: 1946-07-25 Sex: Male         Room/Bed: 4M06C/4M06C-01 Payor Info: Payor: MEDICARE / Plan: MEDICARE PART A AND B / Product Type: *No Product type* /    Admitting Diagnosis: Pancretic CAncer taver  Admit Date/Time:  02/27/2018  5:20 PM Admission Comments: No comment available   Primary Diagnosis:  <principal problem not specified> Principal Problem: <principal problem not specified>  Patient Active Problem List   Diagnosis Date Noted  . Debility 02/27/2018  . S/P laparoscopy 02/19/2018  . Adenocarcinoma of head of pancreas ([place]) 02/19/2018  . Port-A-Cath in place 10/10/2017  . Renal cell carcinoma of right kidney ([place]) 10/02/2017  . Malnutrition of moderate degree 09/28/2017  . Anemia due to antineoplastic chemotherapy 09/28/2017  . Diffuse abdominal pain 09/28/2017  . Ileitis, terminal ([place]) 09/28/2017  . Hyponatremia 09/27/2017  . Dehydration 09/26/2017  . FTT (failure to thrive) in adult 09/26/2017  . Chemotherapy-induced nausea   . Hypokalemia   . Hypomagnesemia   . Poorly controlled type 2 diabetes mellitus with circulatory disorder ([place]) 08/25/2017  . Mixed hyperlipidemia 08/25/2017  . Essential hypertension, benign   . Pancreatic cancer ([place])   . Renal mass   . CAD (coronary artery disease)   . Former smoker   . S/P TAVR (transcatheter aortic valve replacement) 08/22/2017  . Cancer of head of pancreas ([place]) 08/10/2017  . Severe aortic stenosis 07/05/2017    Expected Discharge Date: Expected Discharge Date: 03/03/18  Team Members Present: Physician leading conference: Dr. Alysia Penna Social Worker Present: Ovidio Kin, LCSW Nurse Present: Junius Creamer, RN PT Present: Phylliss Bob, PTA;Barrie Folk, PT OT Present: Simonne Come, OT PPS Coordinator present : Daiva Nakayama,  RN, CRRN     Current Status/Progress Goal Weekly Team Focus  Medical   pancreatic cancer/TAVR--post-op debility, nutrition issues, HTN  improve activity tolerance  nutrition, bp control   Bowel/Bladder   Continent of bowel and bladder  Remain continent      Swallow/Nutrition/ Hydration             ADL's   supervision bathing, dressing and toilet and tub/shower transfers  Mod I to independent  dynamic standing balance, activity tolerance/endurance   Mobility   Up with one assist with walker  Maintain Steady gait      Communication             Safety/Cognition/ Behavioral Observations  Pt does not call for assistance  Educate pt to call for assistance when getting out of bed  Free from falls, bed alarm to be turned on while in bed   Pain   Pt did not c/o any pain on this shift  Remain pain or control pain of 3 or less on pain scale of 10      Skin   Staples intact to the abdominal incision  Abdominal incision to be free from infection       Rehab Goals Patient on target to meet rehab goals: Yes *See Care Plan and progress notes for long and short-term goals.     Barriers to Discharge  Current Status/Progress Possible Resolutions Date Resolved   Physician    Medical stability               Nursing  PT  Inaccessible home environment;Medical stability;Pending chemo/radiation                 OT                  SLP                SW                Discharge Planning/Teaching Needs:    Home with wife who is able to provide supervision level. Wants to go home ASAP     Team Discussion:  New evaluation setting goals of mod/i-supervision level stairs. Little impulsive and balance issues. Short length of stay due to high level. Pt and wife wants this also.  Revisions to Treatment Plan:  DC 6/22    Continued Need for Acute Rehabilitation Level of Care: The patient requires daily medical management by a physician with specialized training in physical  medicine and rehabilitation for the following conditions: Daily direction of a multidisciplinary physical rehabilitation program to ensure safe treatment while eliciting the highest outcome that is of practical value to the patient.: Yes Daily medical management of patient stability for increased activity during participation in an intensive rehabilitation regime.: Yes Daily analysis of laboratory values and/or radiology reports with any subsequent need for medication adjustment of medical intervention for : Post surgical problems;Wound care problems  [name] 03/01/2018, 9:42 AM

## 2018-02-28 NOTE — Progress Notes (Signed)
As per general surgery patient with history of CAD with myocardial infarction and plan to resume back Plavix 75 mg daily as prior to admission.

## 2018-02-28 NOTE — Progress Notes (Signed)
Patient kept reporting his pain as a ).  When I questioned him because his B/P was high he said he had been lying about his pain because he was afraid of the strong pain medication he had been on previously.  I talked him into taking a 77m of his Roxy he had on his PRN meds and he said OK.  Later he told me how great it worked.

## 2018-02-28 NOTE — Progress Notes (Signed)
Social Work Patient ID: Manuel Olson, male   DOB: [birth year], 72 y.o.   MRN: [id number]  Met with pt and wife to discuss team conference goals mod/i-supervision level and target discharge Sat 6/22. Both pleased with this plan and will await team recommendations. Wife can provide supervision level.

## 2018-02-28 NOTE — Progress Notes (Signed)
Pt's blood pressure is elevated. No PRN HTN med is ordered. Pt denies any pain or discomfort. Physician Assistant  on-call was notified of the BP values. No new order was received. He informed the nurse that the patient's meds will be adjusted in am, that he doesn't want to bring it down fast where the pt will not perfuse well. Will continue to monitor patient.

## 2018-02-28 NOTE — Progress Notes (Signed)
Social Work  Social Work Assessment and Plan  Patient Details  Name: Manuel Olson MRN: [id number] Date of Birth: 02-27-1946  Today's Date: 02/28/2018  Problem List:  Patient Active Problem List   Diagnosis Date Noted  . Debility 02/27/2018  . S/P laparoscopy 02/19/2018  . Adenocarcinoma of head of pancreas ([place]) 02/19/2018  . Port-A-Cath in place 10/10/2017  . Renal cell carcinoma of right kidney ([place]) 10/02/2017  . Malnutrition of moderate degree 09/28/2017  . Anemia due to antineoplastic chemotherapy 09/28/2017  . Diffuse abdominal pain 09/28/2017  . Ileitis, terminal ([place]) 09/28/2017  . Hyponatremia 09/27/2017  . Dehydration 09/26/2017  . FTT (failure to thrive) in adult 09/26/2017  . Chemotherapy-induced nausea   . Hypokalemia   . Hypomagnesemia   . Poorly controlled type 2 diabetes mellitus with circulatory disorder ([place]) 08/25/2017  . Mixed hyperlipidemia 08/25/2017  . Essential hypertension, benign   . Pancreatic cancer ([place])   . Renal mass   . CAD (coronary artery disease)   . Former smoker   . S/P TAVR (transcatheter aortic valve replacement) 08/22/2017  . Cancer of head of pancreas ([place]) 08/10/2017  . Severe aortic stenosis 07/05/2017   Past Medical History:  Past Medical History:  Diagnosis Date  . Arthritis   . CAD (coronary artery disease)    a. 11/2016 in [place]: STEMI s/p DES to mid LAD, DES to diagonal, DES x 2 to proximal and mid RCA   . Essential hypertension   . Former smoker   . GERD (gastroesophageal reflux disease)   . Heart murmur   . Hyperlipidemia   . Myocardial infarction ([place])   . Pancreatic cancer ([place])   . Pneumonia   . Renal mass   . S/P TAVR (transcatheter aortic valve replacement)    a. 08/2017:  Edwards Sapien 3 THV (size 26 mm, model #9600CM26A , serial # [id number])  . Severe aortic stenosis    a. 08/2017: s/p TAVR by Dr. Angelena Form and Dr. Cyndia Bent.   . 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 Type 2 diabetes mellitus ([place])     Past Surgical History:  Past Surgical History:  Procedure Laterality Date  . APPENDECTOMY    . CARDIAC CATHETERIZATION    . EUS N/A 08/02/2017   Procedure: UPPER ENDOSCOPIC ULTRASOUND (EUS) RADIAL;  Surgeon: [name], MD;  Location: [facility];  Service: Endoscopy;  Laterality: N/A;  . EYE SURGERY Left   . HAND SURGERY Bilateral   . HERNIA REPAIR Right   . LAPAROSCOPY N/A 02/19/2018   Procedure: DIAGNOSTIC LAPAROSCOPY, ERAS PATHWAY;  Surgeon: [name], MD;  Location: [place];  Service: General;  Laterality: N/A;  . NEPHRECTOMY Left 02/19/2018   Procedure: OPEN LEFT RADICAL NEPHRECTOMY;  Surgeon: Cleon Gustin, MD;  Location: [place];  Service: Urology;  Laterality: Left;  . PORTACATH PLACEMENT N/A 09/15/2017   Procedure: INSERTION PORT-A-CATH;  Surgeon: [name], MD;  Location: [place];  Service: General;  Laterality: N/A;  . RIGHT/LEFT HEART CATH AND CORONARY ANGIOGRAPHY N/A 07/05/2017   Procedure: RIGHT/LEFT HEART CATH AND CORONARY ANGIOGRAPHY;  Surgeon: [name], MD;  Location: [place] [facility];  Service: Cardiovascular;  Laterality: N/A;  . SHOULDER ARTHROSCOPY WITH ROTATOR CUFF REPAIR Left   . TEE WITHOUT CARDIOVERSION N/A 08/22/2017   Procedure: TRANSESOPHAGEAL ECHOCARDIOGRAM (TEE);  Surgeon: Burnell Blanks, MD;  Location: [place];  Service: Open Heart Surgery;  Laterality: N/A;  . TRANSCATHETER AORTIC VALVE REPLACEMENT, TRANSFEMORAL N/A  08/22/2017   Procedure: TRANSCATHETER AORTIC VALVE REPLACEMENT, TRANSFEMORAL;  Surgeon: Burnell Blanks, MD;  Location: [place];  Service: Open Heart Surgery;  Laterality: N/A;  . WHIPPLE PROCEDURE N/A 02/19/2018   Procedure: WHIPPLE PROCEDURE;  Surgeon: [name], MD;  Location: [place];  Service: General;  Laterality: N/A;   Social History:  reports that he quit smoking about 22 months ago. His smoking use included cigarettes. He started smoking about 59 years ago. He has a 57.00  pack-year smoking history. He has never used smokeless tobacco. He reports that he does not drink alcohol or use drugs.  Family / Support Systems Marital Status: Married Patient Roles: Spouse, Parent, Other (Comment)(retiree) Spouse/Significant Other: Manuel Olson [phone]  [phone] Children: Grown children Other Supports: Church members and friends Anticipated Caregiver: Wife Ability/Limitations of Caregiver: Wife is retired and in good health Caregiver Availability: 24/7 Family Dynamics: Close knit family who can depend upon one another. They have friends and church members who are supportive and will visit and assist wife once home.  Social History Preferred language: English Religion: Christian Cultural Background: No issues Education: High School Read: Yes Write: Yes Employment Status: Retired Freight forwarder Issues: No issues Guardian/Conservator: None-according to MD pt is capable of making his own decisions while here   Abuse/Neglect Abuse/Neglect Assessment Can Be Completed: Yes Physical Abuse: Denies Verbal Abuse: Denies Sexual Abuse: Denies Exploitation of patient/patient's resources: Denies Self-Neglect: Denies  Emotional Status Pt's affect, behavior adn adjustment status: Pt is motivated to do well and get home soon, he was told by rounding MD he would be home by the weekend or Monday. He is having dizziness/vertigo which he has dealt with most of his life due to his hearing loss as a child. He is progressing and getting better daily and he id thankful for this Recent Psychosocial Issues: other health issues-finding of pancreatic cancer wheil replacing TAVR Pyschiatric History: No issues-deferred depression screen due to coping appropriately and will be a short length of stay Substance Abuse History: no issues  Patient / Family Perceptions, Expectations & Goals Pt/Family understanding of illness & functional limitations: Pt and wife have a good  understanding thus far of his issues, they are awaiting more information regarding the cancer they found in surgery for his AV replacement. Both are optimisitic and feel their is a reason for everything.  Premorbid pt/family roles/activities: husband, father, retiree, church member, friend, etc Anticipated changes in roles/activities/participation: resume Pt/family expectations/goals: Pt states: " I want to be able to do for myself and not need help, I am almost there."  Wife states: " I will help him if he will let me."  [employer]: None Premorbid Home Care/DME Agencies: None Transportation available at discharge: Wife Resource referrals recommended: Support group (specify)  Discharge Planning Living Arrangements: Spouse/significant other Support Systems: Spouse/significant other, Children, Water engineer, Social worker community Type of Residence: Private residence Insurance underwriter Resources: [employer], Multimedia programmer (specify)(BCBS) Financial Resources: Radio broadcast assistant Screen Referred: No Living Expenses: Own Money Management: Patient, Spouse Does the patient have any problems obtaining your medications?: No Home Management: Both help one another Patient/Family Preliminary Plans: Return home with wife who is able to assist if needed. She plans to be here to see him in therapies and provide support. Pt is hopeful he will be here a short time and will be home by the weekend. Await therapy evaluations and goals. Social Work Anticipated Follow Up Needs: HH/OP, Support Group  Clinical Impression Pleasant couple who are optimistic regarding his  recover and are waiting more information regarding next step. Aware will be short length of stay due to high level and both pleased with this. Await therapy evaluations.  Manuel Olson 02/28/2018, 9:46 AM

## 2018-02-28 NOTE — Progress Notes (Signed)
[facility]     PROGRESS NOTE    Subjective/Complaints: Had a reasonable night, a little restless. Ate dinner. Ready for therapies today  ROS: Patient denies fever, rash, sore throat, blurred vision, nausea, vomiting, diarrhea, cough, shortness of breath or chest pain, joint or back pain, headache, or mood change.   Objective:  No results found. Recent Labs    02/26/18 0210 02/28/18 0705  WBC 11.2* 10.5  HGB 9.4* 9.4*  HCT 28.7* 28.9*  PLT 116* 137*   Recent Labs    02/26/18 0210 02/28/18 0705  NA 133* 135  K 3.7 3.6  CL 100* 102  GLUCOSE 142* 104*  BUN 23* 16  CREATININE 1.38* 1.34*  CALCIUM 8.6* 8.8*   CBG (last 3)  Recent Labs    02/27/18 2000 02/28/18 0040 02/28/18 0427  GLUCAP 134* 86 101*    Wt Readings from Last 3 Encounters:  02/27/18 73.9 kg (162 lb 14.7 oz)  02/19/18 73.9 kg (162 lb 14.7 oz)  02/09/18 71.2 kg (157 lb)    No intake or output data in the 24 hours ending 02/28/18 0904  Vital Signs: Blood pressure (!) 172/76, pulse 85, temperature 98.7 F (37.1 C), temperature source Oral, resp. rate 18, height 5' 9"  (1.753 m), weight 73.9 kg (162 lb 14.7 oz), SpO2 96 %. Physical Exam:  Constitutional: No distress . Vital signs reviewed. HEENT: EOMI, oral membranes moist Neck: supple Cardiovascular: RRR without murmur. No JVD    Respiratory: CTA Bilaterally without wheezes or rales. Normal effort    GI: BS +, non-tender, non-distended. Staples in place Musculoskeletal:Normal range of motion. He exhibits noedema.  Neurological: He isalertand oriented to person, place, and time. UE 4/5 prox to distal. LE:3+ HF, 4 KE and 4/5 ADF/PF Skin: Skin iswarmand dry. He isnot diaphoretic.  Psychiatric: pleasant and normal affect.     Assessment/Plan: 1. Functional deficits secondary to pancreatic cancer/surgery/TAVR which require 3+ hours per day of interdisciplinary therapy in a comprehensive inpatient rehab  setting. Physiatrist is providing close team supervision and 24 hour management of active medical problems listed below. Physiatrist and rehab team continue to assess barriers to discharge/monitor patient progress toward functional and medical goals.  Function:  Bathing Bathing position      Bathing parts      Bathing assist        Upper Body Dressing/Undressing Upper body dressing                    Upper body assist        Lower Body Dressing/Undressing Lower body dressing                                  Lower body assist        Toileting Toileting          Toileting assist     Transfers Chair/bed transfer   Chair/bed transfer method: Stand pivot, Ambulatory Chair/bed transfer assist level: Supervision or verbal cues Chair/bed transfer assistive device: Armrests     Locomotion Ambulation     Max distance: 248f  Assist level: Supervision or verbal cues   Wheelchair   Type: Manual Max wheelchair distance: [id number] Level: Supervision or verbal cues  Cognition Comprehension    Expression    Social Interaction    Problem Solving    Memory     Medical Problem List and Plan: 1.Debilitysecondary  to complication related to pancreatic cancer/left radical nephrectomy 02/19/2018 as well as recent TAVR procedure -beginning therapies today  -expect short LOS 2. DVT Prophylaxis/Anticoagulation: Lovenox. Monitor for any bleeding episodes 3. Pain Management:Neurontin 200 mg twice daily, baclofen 10 mg 3 times daily as needed, oxycodone as needed 4. Mood:Provide emotional support 5. Neuropsych: This patientiscapable of making decisions on hisown behalf. 6. Skin/Wound Care:Routine skin checks 7. Fluids/Electrolytes/Nutrition:  -I personally reviewed the patient's labs today.    -ate a little better last night 8.Acute blood loss anemia. Follow-up hgb 9.4 9.Diabetes mellitus. Hemoglobin A1c 8.2. Lantus insulin  22 units nightly.   -good control so far  10.Hypertension. Lisinopril 10 mg daily, Coreg 3.125 mg twice daily. 11.Hyperlipidemia. Lipitor 12.CAD/MI. Patient on aspirin and Plavix prior to admission. plavix resumed  LOS (Days) 1 A FACE TO FACE EVALUATION WAS PERFORMED  Meredith Staggers, MD 02/28/2018 9:04 AM

## 2018-02-28 NOTE — Progress Notes (Signed)
Occupational Therapy Session Note  Patient Details  Name: Manuel Olson MRN: [id number] Date of Birth: Jan 18, 1946  Today's Date: 02/28/2018 OT Individual Time: 1500-1600 OT Individual Time Calculation (min): 60 min    Short Term Goals: Week 1:  OT Short Term Goal 1 (Week 1): STGs = LTGs  Skilled Therapeutic Interventions/Progress Updates:  Treatment session with focus on functional transfers, dynamic standing balance, and activity tolerance.  Pt transferred bed > w/c with close supervision due to impulsivity.  Engaged in [place] transfers stepping over tub ledge with use of grab bars.  Discussed placement of grab bars for increased safety, as pt reports already having purchased bars but not having placed them yet.  Recommended shower seat for energy conservation and increased safety during bathing with pt in agreement.  Also discussed recommendation for 3 in1 for urgency with toileting with pt in agreement.  Engaged in Wii bowling and Wii fit activity on balance board with focus on dynamic standing balance, balance reactions, and endurance with min guard throughout.  Pt reporting enjoyment of activities and feeling encouraged by balance reactions and endurance throughout session.  Returned to room via w/c with pt propelling for BUE strengthening/endurance.  Pt left seated EOB with wife present.  Therapy Documentation Precautions:  Precautions Precautions: Fall Restrictions Weight Bearing Restrictions: No General:   Vital Signs: Therapy Vitals Temp: 98.4 F (36.9 C) Temp Source: Oral Pulse Rate: 68 Resp: 16 BP: (!) 181/75(nurse notified) Patient Position (if appropriate): Lying Oxygen Therapy SpO2: 97 % O2 Device: Room Air Pain:  Pt with c/o pain in lower back.  RN providing pain meds at beginning of session.  See Function Navigator for Current Functional Status.   Therapy/Group: Individual Therapy  Simonne Come 02/28/2018, 4:13 PM

## 2018-02-28 NOTE — Progress Notes (Signed)
[name], RN  Rehab Admission Coordinator  Physical Medicine and Rehabilitation  PMR Pre-admission  Signed  Date of Service:  02/27/2018 1:54 PM       Related encounter: Admission (Discharged) from 02/19/2018 in [place]      Signed            Show:Clear all [x] Manual[x] Template[x] Copied  Added by: [x] [name], RN   [] Hover for details   PMR Admission Coordinator Pre-Admission Assessment  Patient: Manuel Olson is an 72 y.o., male MRN: [id number] DOB: 18-Mar-1946 Height: 5' 9"  (175.3 cm) Weight: 73.9 kg (162 lb 14.7 oz)                                                                                                                                                  Insurance Information HMO: No   PPO:       PCP:       IPA:       80/20:       OTHER:   PRIMARY:  Medicare A and B      Policy#: [id number]      Subscriber:  patient CM Name:        Phone#:       Fax#:   Pre-Cert#:        Employer: Retired Benefits:  Phone #:       Name: Checked in [place]. Date: A=09/12/08 and B=03/13/15     Deduct: $1364      Out of Pocket Max:  None      Life Max: N/A CIR: 100%      SNF: 100 days Outpatient: 80%     Co-Pay: 20% Home Health: 100%      Co-Pay: none DME: 80%     Co-Pay: 20% Providers: patient's choice  SECONDARY: BCBS supplement      Policy#: [id number]      Subscriber:  patient CM Name:        Phone#:       Fax#:   Pre-Cert#:        Employer: Retired Benefits:  Phone #: [phone]     Name:   Eff. Date:       Deduct:        Out of Pocket Max:        Life Max:   CIR:        SNF:   Outpatient:       Co-Pay:   Home Health:        Co-Pay:   DME:       Co-Pay:    Emergency Contact Information         Contact Information    Name Relation Home Work [place] Spouse [phone]  [phone]     Current Medical History  Patient Admitting  Diagnosis: Debility after  complications related to pancreatic cancer, multiple surgeries, recent TAVR   History of Present Illness: A 72 year old right-handed male with history of CAD/MI and was on aspirin and Plavix, CVA, diabetes mellitus, former smoker. Per chart review patient lives with spouse. Independent prior to admission. One level home. He is retired. Presented 02/19/2018 with recent diagnosis of pancreatic cancer incidentally identified while undergoing work-up for aortic valve surgery. Patient had TAVR procedure then neoadjuvanttreatment as well as left renal cancer. Underwent pancreaticuduodenectomywith portal vein resection and placement of biliary and pancreatic duct stents 02/19/2018 per Dr. Barry Dienes as well as open left radical nephrectomy by Dr. Alyson Ingles. Hospital course anemia patient was transfused. Later placed on Lovenox for DVT prophylaxis. Intermittent bouts of confusion that steadily improved. Pathology report remains pending. Diet has slowly been advanced. Physical and occupational therapy evaluations completed with recommendations of physical medicine rehab consult. Patient was admitted for a comprehensive rehab program.   Past Medical History      Past Medical History:  Diagnosis Date  . Arthritis   . CAD (coronary artery disease)    a. 11/2016 in [place]: STEMI s/p DES to mid LAD, DES to diagonal, DES x 2 to proximal and mid RCA   . Essential hypertension   . Former smoker   . GERD (gastroesophageal reflux disease)   . Heart murmur   . Hyperlipidemia   . Myocardial infarction ([place])   . Pancreatic cancer ([place])   . Pneumonia   . Renal mass   . S/P TAVR (transcatheter aortic valve replacement)    a. 08/2017:  [place] Sapien 3 THV (size 26 mm, model #9600CM26A , serial # [id number])  . Severe aortic stenosis    a. 08/2017: s/p TAVR by Dr. Angelena Form and Dr. Cyndia Bent.   . 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 Type 2 diabetes mellitus (HCC)     Family History  family  history includes CAD in his brother; Hypertension in his brother; Lupus in his mother.  Prior Rehab/Hospitalizations: No previous rehab  Has the patient had major surgery during 100 days prior to admission? No  Current Medications   Current Facility-Administered Medications:  .  0.9 %  sodium chloride infusion, , Intravenous, Once, [name], MD .  acetaminophen (TYLENOL) tablet 650 mg, 650 mg, Oral, Q6H PRN, [name], MD .  atorvastatin (LIPITOR) tablet 20 mg, 20 mg, Oral, Daily, [name], MD, 20 mg at 02/27/18 1027 .  baclofen (LIORESAL) tablet 10 mg, 10 mg, Oral, TID PRN, [name], MD, 10 mg at 02/23/18 1042 .  carvedilol (COREG) tablet 3.125 mg, 3.125 mg, Oral, BID WC, [name], MD, 3.125 mg at 02/27/18 0848 .  dextrose 5 %-0.9 % sodium chloride infusion, , Intravenous, Continuous, Focht, Jessica L, PA, Last Rate: 50 mL/hr at 02/27/18 1200 .  enoxaparin (LOVENOX) injection 30 mg, 30 mg, Subcutaneous, Q12H, Excell Seltzer, MD, 30 mg at 02/27/18 1126 .  feeding supplement (ENSURE ENLIVE) (ENSURE ENLIVE) liquid 237 mL, 237 mL, Oral, BID BM, Focht, Jessica L, PA, 237 mL at 02/27/18 1028 .  gabapentin (NEURONTIN) capsule 200 mg, 200 mg, Oral, BID, [name], MD, 200 mg at 02/27/18 1026 .  HYDROmorphone (DILAUDID) injection 0.5-1 mg, 0.5-1 mg, Intravenous, Q2H PRN, [name], MD, 1 mg at 02/23/18 2206 .  insulin aspart (novoLOG) injection 0-9 Units, 0-9 Units, Subcutaneous, Q4H, [name], MD, 1 Units at 02/26/18 1744 .  insulin glargine (LANTUS) injection 22 Units, 22 Units, Subcutaneous,  Red Christians, MD, 22 Units at 02/26/18 2130 .  lisinopril (PRINIVIL,ZESTRIL) tablet 10 mg, 10 mg, Oral, Daily, [name], MD, 10 mg at 02/27/18 1026 .  ondansetron (ZOFRAN-ODT) disintegrating tablet 4 mg, 4 mg, Oral, Q6H PRN **OR** ondansetron (ZOFRAN) injection 4 mg, 4 mg, Intravenous, Q6H PRN, [name], MD .  oxyCODONE (Oxy IR/ROXICODONE) immediate  release tablet 5-10 mg, 5-10 mg, Oral, Q4H PRN, [name], MD, 10 mg at 02/26/18 2130 .  pantoprazole (PROTONIX) EC tablet 40 mg, 40 mg, Oral, Q1200, [name], MD, 40 mg at 02/27/18 1127  Patients Current Diet:       Diet Order           DIET SOFT Room service appropriate? Yes; Fluid consistency: Thin  Diet effective now          Precautions / Restrictions Precautions Precautions: Fall, Other (comment) Precaution Comments: abdomen Restrictions Weight Bearing Restrictions: No   Has the patient had 2 or more falls or a fall with injury in the past year?Yes.  Patient reports 2 falls with minor injury in the past year  Prior Activity Level Patient went out 1 X a week, was driving.  Did go outside more frequently  Time / Equipment Home Assistive Devices/Equipment: Eyeglasses, CBG Meter, Dentures (specify type), Hearing aid Home Equipment: None  Prior Device Use: Indicate devices/aids used by the patient prior to current illness, exacerbation or injury? None  Prior Functional Level Prior Function Level of Independence: Independent Comments: drives, retired  Self Care: Did the patient need help bathing, dressing, using the toilet or eating?  Independent  Indoor Mobility: Did the patient need assistance with walking from room to room (with or without device)? Independent  Stairs: Did the patient need assistance with internal or external stairs (with or without device)? Independent  Functional Cognition: Did the patient need help planning regular tasks such as shopping or remembering to take medications? Independent  Current Functional Level Cognition  Overall Cognitive Status: Impaired/Different from baseline Orientation Level: Oriented X4 Following Commands: Follows one step commands with increased time, Follows one step commands consistently Safety/Judgement: Decreased awareness of safety, Decreased awareness of deficits General  Comments: pt continues to remain impulsive, but demonstrates improvements overall with cognition, follows commands consistently with cues provided intermittently     Extremity Assessment (includes Sensation/Coordination)  Upper Extremity Assessment: Generalized weakness  Lower Extremity Assessment: Defer to PT evaluation    ADLs  Overall ADL's : Needs assistance/impaired Grooming: Set up, Supervision/safety, Sitting Upper Body Bathing: Set up, Supervision/ safety, Sitting Lower Body Bathing: Minimal assistance, Sit to/from stand Upper Body Dressing : Minimal assistance, Sitting Lower Body Dressing: Sit to/from stand, Minimal assistance Toilet Transfer: Minimal assistance, RW, Ambulation Toileting- Clothing Manipulation and Hygiene: Minimal assistance(to steady pt) Functional mobility during ADLs: Minimal assistance, Rolling walker, Cueing for safety, Cueing for sequencing General ADL Comments: wife present and engaged in session; pt reports having already completed ADLs, engaged in bil UE/LE exercises and sit<>stands for increased strengthening and endurance     Mobility  Overal bed mobility: Needs Assistance Bed Mobility: Sit to Supine, Rolling, Sidelying to Sit Rolling: Min assist Sidelying to sit: Min assist Sit to supine: Supervision General bed mobility comments: verbal cues for technique and assist to elevate trunk into sitting; supervision when returning to supine     Transfers  Overall transfer level: Needs assistance Equipment used: Rolling walker (2 wheeled) Transfers: Sit to/from Stand Sit to Stand: Min guard General transfer comment: no physical  assist but min guard for safety     Ambulation / Gait / Stairs / Wheelchair Mobility  Ambulation/Gait Ambulation/Gait assistance: Herbalist (Feet): 500 Feet Assistive device: Rolling walker (2 wheeled) Gait Pattern/deviations: Step-through pattern, Shuffle, Trunk flexed General Gait Details: max  directional v/c's to slow down, minA for safe walker management, tactile and directional cues to stand up straight, attempted to ambulate without RW, pt very unsteady requiring min/modA to maintain balance to prevent fall Gait velocity: slow Gait velocity interpretation: <1.31 ft/sec, indicative of household ambulator    Posture / Balance Dynamic Sitting Balance Sitting balance - Comments: pt able to done socks without difficulty in chair Balance Overall balance assessment: Needs assistance Sitting-balance support: No upper extremity supported, Feet supported Sitting balance-Leahy Scale: Good Sitting balance - Comments: pt able to done socks without difficulty in chair Standing balance support: No upper extremity supported Standing balance-Leahy Scale: Fair Standing balance comment: able to release RW to stand to urinate    Special needs/care consideration BiPAP/CPAP No CPM No Continuous Drip IV D5%0.9 NS at 50 mL/hr Dialysis No     Life Vest No Oxygen No Special Bed No Trach Size No Wound Vac (area) No   Skin Abdominal incision post whipple procedure                           Bowel mgmt: Last BM 02/27/18 per patient Bladder mgmt: Voiding up in bathroom with assist, has urgency Diabetic mgmt Yes, now on oral medication and insuline    Previous Home Environment Living Arrangements: Spouse/significant other Available Help at Discharge: Family, Available 24 hours/day Type of Home: House Home Layout: One level Home Access: Stairs to enter Entrance Stairs-Rails: None(something there to pull on, but not a formal rail. ) Entrance Stairs-Number of Steps: 3(small) Bathroom Shower/Tub: Optometrist: Yes How Accessible: Accessible via walker Home Care Services: No  Discharge Living Setting Plans for Discharge Living Setting: Patient's home, House, Lives with (comment)(Lives with wife.) Type of Home at Discharge:  House Discharge Home Layout: Multi-level, Laundry or work area in basement, Able to live on main level with bedroom/bathroom Alternate Level Stairs-Number of Steps: 14 Discharge Home Access: Stairs to enter Entrance Stairs-Number of Steps: 2 Does the patient have any problems obtaining your medications?: No  Social/Family/Support Systems Patient Roles: Spouse(Has a wife) Contact Information: Petros Ahart - wife - [phone] Anticipated Caregiver: Wife Ability/Limitations of Caregiver: Wife is retired and can assist. Careers adviser: 24/7 Discharge Plan Discussed with Primary Caregiver: Yes Is Caregiver In Agreement with Plan?: Yes Does Caregiver/Family have Issues with Lodging/Transportation while Pt is in Rehab?: No  Goals/Additional Needs Patient/Family Goal for Rehab: PT/OT mod I and supervision goals Expected length of stay: 8-12 days Cultural Considerations: None Dietary Needs: soft diet, thin liquids Equipment Needs: TBD Pt/Family Agrees to Admission and willing to participate: Yes Program Orientation Provided & Reviewed with Pt/Caregiver Including Roles  & Responsibilities: Yes  Decrease burden of Care through IP rehab admission: N/A  Possible need for SNF placement upon discharge: Not anticipated  Patient Condition: This patient's condition remains as documented in the consult dated 02/26/18, in which the Rehabilitation Physician determined and documented that the patient's condition is appropriate for intensive rehabilitative care in an inpatient rehabilitation facility. Will admit to inpatient rehab today.  Preadmission Screen Completed By:  [name], 02/27/2018 2:11 PM ______________________________________________________________________   Discussed status with Dr. Naaman Plummer on 02/27/18 at  1410 and received telephone approval for admission today.  Admission Coordinator:  [name], time 1411/Date 02/27/18             Cosigned by:  Meredith Staggers, MD at 02/27/2018 2:48 PM  Revision History

## 2018-03-01 ENCOUNTER — Inpatient Hospital Stay (HOSPITAL_COMMUNITY): Payer: Medicare Other | Admitting: Physical Therapy

## 2018-03-01 ENCOUNTER — Inpatient Hospital Stay (HOSPITAL_COMMUNITY): Payer: Medicare Other | Admitting: Occupational Therapy

## 2018-03-01 LAB — GLUCOSE, CAPILLARY
Glucose-Capillary: 127 mg/dL — ABNORMAL HIGH (ref 65–99)
Glucose-Capillary: 120 mg/dL — ABNORMAL HIGH (ref 65–99)
Glucose-Capillary: 85 mg/dL (ref 65–99)

## 2018-03-01 NOTE — Progress Notes (Signed)
Occupational Therapy Session Note  Patient Details  Name: Manuel Olson MRN: [id number] Date of Birth: 1946/01/03  Today's Date: 03/01/2018 OT Individual Time: 0935-1030 OT Individual Time Calculation (min): 55 min    Short Term Goals: Week 1:  OT Short Term Goal 1 (Week 1): STGs = LTGs  Skilled Therapeutic Interventions/Progress Updates:    Treatment session with focus on activity tolerance and dynamic standing balance.  Pt reports increased dizziness and disorientation this AM, attributing to pain meds taken yesterday and sound sleep he got overnight.  TEDS on and pt willing to engage in therapeutic activity.  Ambulated without AD with min guard to close supervision to therapy gym with noted increased wobbliness initially.  Engaged in standing activity on foam pad to challenge balance reactions and endurance while completing table top task.  Incorporated alternating UE and crossing midline as well as cognitive challenge.  Vitals assessed (see flowsheets) throughout with pt stating still feeling a little dizzy but much more oriented and level headed than earlier in AM.  Returned to room as above with pt demonstrating increased stability.  Discussed recommendation for pt to take a shower during OT session tomorrow provided pt with improved vitals and no dizziness, pt in agreement.  Therapy Documentation Precautions:  Precautions Precautions: Fall Restrictions Weight Bearing Restrictions: No General:   Vital Signs: Therapy Vitals Pulse Rate: 70 BP: 133/69 Patient Position (if appropriate): Sitting Pain: Pain Assessment Pain Scale: 0-10 Pain Score: 6  Pain Type: Acute pain;Chronic pain;Surgical pain Pain Location: Back Pain Orientation: Lower Pain Intervention(s): Medication (See eMAR)  See Function Navigator for Current Functional Status.   Therapy/Group: Individual Therapy  Simonne Come 03/01/2018, 10:42 AM

## 2018-03-01 NOTE — Progress Notes (Signed)
Social Work Patient ID: Manuel Olson, male   DOB: [birth year], 72 y.o.   MRN: [id number] Met with pt and wife to discuss equipment and follow up. Needs a rolling walker and tub bench, they will get tub bench on their own. Have ordered rolling walker. Pt has been to OP in [place] before have contacted [facility] to set up OPPT. Work on discharge for Sat.

## 2018-03-01 NOTE — Progress Notes (Signed)
[facility] PHYSICAL MEDICINE & REHABILITATION     PROGRESS NOTE    Subjective/Complaints: In good spirits. Appetite seems to be improving. Denies pain. Still feels weak  ROS: Patient denies fever, rash, sore throat, blurred vision, nausea, vomiting, diarrhea, cough, shortness of breath or chest pain, joint or back pain, headache, or mood change.   Objective:  No results found. Recent Labs    02/28/18 0705  WBC 10.5  HGB 9.4*  HCT 28.9*  PLT 137*   Recent Labs    02/28/18 0705  NA 135  K 3.6  CL 102  GLUCOSE 104*  BUN 16  CREATININE 1.34*  CALCIUM 8.8*   CBG (last 3)  Recent Labs    02/28/18 1156 02/28/18 1646 02/28/18 2100  GLUCAP 168* 105* 117*    Wt Readings from Last 3 Encounters:  02/27/18 73.9 kg (162 lb 14.7 oz)  02/19/18 73.9 kg (162 lb 14.7 oz)  02/09/18 71.2 kg (157 lb)     Intake/Output Summary (Last 24 hours) at 03/01/2018 1057 Last data filed at 03/01/2018 0845 Gross per 24 hour  Intake 720 ml  Output -  Net 720 ml    Vital Signs: Blood pressure 133/69, pulse 70, temperature 98 F (36.7 C), temperature source Oral, resp. rate 18, height 5' 9"  (1.753 m), weight 73.9 kg (162 lb 14.7 oz), SpO2 95 %. Physical Exam:  Constitutional: No distress . Vital signs reviewed. HEENT: EOMI, oral membranes moist Neck: supple Cardiovascular: RRR without murmur. No JVD    Respiratory: CTA Bilaterally without wheezes or rales. Normal effort    GI: BS +, non-tender, non-distended, staples  Musculoskeletal:Normal range of motion. He exhibits noedema.  Neurological: He isalertand oriented to person, place, and time. UE 4/5 prox to distal. LE:3+ HF, 4 KE and 4/5 ADF/PF--stable motor exam Skin: Skin iswarmand dry. He isnot diaphoretic.  Psychiatric: pleasant    Assessment/Plan: 1. Functional deficits secondary to pancreatic cancer/surgery/TAVR which require 3+ hours per day of interdisciplinary therapy in a comprehensive inpatient rehab  setting. Physiatrist is providing close team supervision and 24 hour management of active medical problems listed below. Physiatrist and rehab team continue to assess barriers to discharge/monitor patient progress toward functional and medical goals.  Function:  Bathing Bathing position   Position: Shower  Bathing parts Body parts bathed by patient: Right arm, Left arm, Chest, Abdomen, Front perineal area, Buttocks, Right upper leg, Back, Left lower leg, Right lower leg, Left upper leg    Bathing assist Assist Level: Supervision or verbal cues      Upper Body Dressing/Undressing Upper body dressing   What is the patient wearing?: Pull over shirt/dress     Pull over shirt/dress - Perfomed by patient: Thread/unthread right sleeve, Thread/unthread left sleeve, Put head through opening, Pull shirt over trunk          Upper body assist Assist Level: Set up      Lower Body Dressing/Undressing Lower body dressing   What is the patient wearing?: Underwear, Pants, Socks, Shoes Underwear - Performed by patient: Thread/unthread right underwear leg, Thread/unthread left underwear leg, Pull underwear up/down   Pants- Performed by patient: Thread/unthread right pants leg, Thread/unthread left pants leg, Pull pants up/down       Socks - Performed by patient: Don/doff right sock, Don/doff left sock   Shoes - Performed by patient: Don/doff right shoe, Don/doff left shoe, Fasten right, Fasten left            Lower body assist Assist for  lower body dressing: Supervision or verbal cues      Toileting Toileting   Toileting steps completed by patient: Adjust clothing prior to toileting, Performs perineal hygiene, Adjust clothing after toileting      Toileting assist Assist level: More than reasonable time   Transfers Chair/bed transfer   Chair/bed transfer method: Stand pivot, Ambulatory Chair/bed transfer assist level: Supervision or verbal cues Chair/bed transfer assistive  device: Armrests     Locomotion Ambulation     Max distance: 219f  Assist level: Supervision or verbal cues   Wheelchair   Type: Manual Max wheelchair distance: 1550fAssist Level: Supervision or verbal cues  Cognition Comprehension Comprehension assist level: Follows complex conversation/direction with no assist  Expression Expression assist level: Expresses complex ideas: With no assist  Social Interaction Social Interaction assist level: Interacts appropriately with others - No medications needed.  Problem Solving Problem solving assist level: Solves complex problems: Recognizes & self-corrects  Memory Memory assist level: Complete Independence: No helper   Medical Problem List and Plan: 1.Debilitysecondary to complication related to pancreatic cancer/left radical nephrectomy 02/19/2018 as well as recent TAVR procedure -beginning therapies today  -expect short LOS---?6/22 2. DVT Prophylaxis/Anticoagulation: Lovenox. Monitor for any bleeding episodes 3. Pain Management:Neurontin 200 mg twice daily, baclofen 10 mg 3 times daily as needed, oxycodone as needed 4. Mood:Provide emotional support 5. Neuropsych: This patientiscapable of making decisions on hisown behalf. 6. Skin/Wound Care:Routine skin checks 7. Fluids/Electrolytes/Nutrition:  -intake improved 8.Acute blood loss anemia. Follow-up hgb 9.4 9.Diabetes mellitus. Hemoglobin A1c 8.2. Lantus insulin 22 units nightly.   -good control  10.Hypertension. Lisinopril 10 mg daily, Coreg 3.125 mg twice daily. 11.Hyperlipidemia. Lipitor 12.CAD/MI. Patient on aspirin and Plavix prior to admission. plavix resumed  LOS (Days) 2 A FACE TO FACE EVALUATION WAS PERFORMED  ZaMeredith StaggersMD 03/01/2018 10:57 AM

## 2018-03-01 NOTE — Progress Notes (Signed)
Contacted on-call [facility], [place] regarding scheduled long-acting Lantus 22 units injection with CBG of 85 and possible nocturnal hypoglycemia; on-call recommended to administer scheduled Lantus

## 2018-03-01 NOTE — Progress Notes (Signed)
Social Work   Fritzi Scripter, Eliezer Champagne  Social Worker  Physical Medicine and Rehabilitation  Patient Care Conference  Signed  Date of Service:  02/28/2018  2:00 PM          Signed          Show:Clear all [x] Manual[x] Template[] Copied  Added by: [x] Burnell Hurta, Gardiner Rhyme, LCSW   [] Hover for details   Inpatient RehabilitationTeam Conference and Plan of Care Update Date: 02/28/2018   Time: 11:30 AM      Patient Name: KAVIAN PETERS      Medical Record Number: [id number]  Date of Birth: 1946-04-25 Sex: Male         Room/Bed: 4M06C/4M06C-01 Payor Info: Payor: MEDICARE / Plan: MEDICARE PART A AND B / Product Type: *No Product type* /     Admitting Diagnosis: Pancretic CAncer taver  Admit Date/Time:  02/27/2018  5:20 PM Admission Comments: No comment available    Primary Diagnosis:  <principal problem not specified> Principal Problem: <principal problem not specified>       Patient Active Problem List    Diagnosis Date Noted  . Debility 02/27/2018  . S/P laparoscopy 02/19/2018  . Adenocarcinoma of head of pancreas ([place]) 02/19/2018  . Port-A-Cath in place 10/10/2017  . Renal cell carcinoma of right kidney ([place]) 10/02/2017  . Malnutrition of moderate degree 09/28/2017  . Anemia due to antineoplastic chemotherapy 09/28/2017  . Diffuse abdominal pain 09/28/2017  . Ileitis, terminal ([place]) 09/28/2017  . Hyponatremia 09/27/2017  . Dehydration 09/26/2017  . FTT (failure to thrive) in adult 09/26/2017  . Chemotherapy-induced nausea    . Hypokalemia    . Hypomagnesemia    . Poorly controlled type 2 diabetes mellitus with circulatory disorder ([place]) 08/25/2017  . Mixed hyperlipidemia 08/25/2017  . Essential hypertension, benign    . Pancreatic cancer ([place])    . Renal mass    . CAD (coronary artery disease)    . Former smoker    . S/P TAVR (transcatheter aortic valve replacement) 08/22/2017  . Cancer of head of pancreas ([place]) 08/10/2017  . Severe aortic stenosis  07/05/2017      Expected Discharge Date: Expected Discharge Date: 03/03/18   Team Members Present: Physician leading conference: Dr. Alysia Penna Social Worker Present: Ovidio Kin, LCSW Nurse Present: Junius Creamer, RN PT Present: Phylliss Bob, PTA;Barrie Folk, PT OT Present: Simonne Come, OT PPS Coordinator present : Daiva Nakayama, RN, CRRN       Current Status/Progress Goal Weekly Team Focus  Medical     pancreatic cancer/TAVR--post-op debility, nutrition issues, HTN  improve activity tolerance  nutrition, bp control   Bowel/Bladder     Continent of bowel and bladder  Remain continent      Swallow/Nutrition/ Hydration               ADL's     supervision bathing, dressing and toilet and tub/shower transfers  Mod I to independent  dynamic standing balance, activity tolerance/endurance   Mobility     Up with one assist with walker  Maintain Steady gait      Communication               Safety/Cognition/ Behavioral Observations   Pt does not call for assistance  Educate pt to call for assistance when getting out of bed  Free from falls, bed alarm to be turned on while in bed   Pain     Pt did not c/o any pain on this shift  Remain pain or  control pain of 3 or less on pain scale of 10      Skin     Staples intact to the abdominal incision  Abdominal incision to be free from infection        Rehab Goals Patient on target to meet rehab goals: Yes *See Care Plan and progress notes for long and short-term goals.      Barriers to Discharge   Current Status/Progress Possible Resolutions Date Resolved   Physician     Medical stability              Nursing                 PT  Inaccessible home environment;Medical stability;Pending chemo/radiation                 OT                 SLP            SW              Discharge Planning/Teaching Needs:    Home with wife who is able to provide supervision level. Wants to go home ASAP     Team Discussion:  New  evaluation setting goals of mod/i-supervision level stairs. Little impulsive and balance issues. Short length of stay due to high level. Pt and wife wants this also.  Revisions to Treatment Plan:  DC 6/22    Continued Need for Acute Rehabilitation Level of Care: The patient requires daily medical management by a physician with specialized training in physical medicine and rehabilitation for the following conditions: Daily direction of a multidisciplinary physical rehabilitation program to ensure safe treatment while eliciting the highest outcome that is of practical value to the patient.: Yes Daily medical management of patient stability for increased activity during participation in an intensive rehabilitation regime.: Yes Daily analysis of laboratory values and/or radiology reports with any subsequent need for medication adjustment of medical intervention for : Post surgical problems;Wound care problems   [name] 03/01/2018, 9:42 AM                 Patient ID: [name], male   DOB: 1946-04-30, 72 y.o.   MRN: [id number]

## 2018-03-01 NOTE — Progress Notes (Signed)
Physical Therapy Session Note  Patient Details  Name: Manuel Olson MRN: [id number] Date of Birth: 1946/06/21  Today's Date: 03/01/2018 PT Individual Time: 0805-0900 AND 1500-1615 PT Individual Time Calculation (min): 55 min AND 75 min   Short Term Goals: Week 1:  PT Short Term Goal 1 (Week 1): STG=LTG due to ELOS  Skilled Therapeutic Interventions/Progress Updates:   Pt received sitting in WC and agreeable to PT. Pt reports that he is very dizzy this AM. PT donned Ted Hose with pt in sitting, and assessed Orthostatic vital signs. Sitting: 130/60. Standing 104/58. Standing at 3 min 124/63. Pt reports significant orthostasis sx initially with reduces dizziness after 3 min standing.   Gait training to rehab gym x 135f with min assist from PT due to mild discordination of gait pattern. Orthostatics assessed in gym following short rest break: sitting 128/64. Standing 118/59, mildly symptomatic.  Endurance training on Nustep to increase BP through exertion 6 min +4 minutes with supervision assist for proper speed and ROM. BP assessed in sitting following endurance training, 142/69 and in standing, 124/74.   Additional gait training with supervision assist throughout the rest of the treatment due to reduced dizziness and improved righting reaction with mild LOB x [id number] and [id number]  Standing Balance training instructed by PT 2 x 4 minute to toss ball off trampoline min assist progressing to supervision assist from PT with min cues for ankle strategy to prevent posterior LOB.   Patient returned to room and [facility] and all needs met.  \    Session 2.   Pt received sitting in WC and agreeable to PT. Pt reports no sx of orthostasis this PM. Gait training to Rehab gym with distant supervision assist. PT. PT assessed vitals 175/75. Gait training instructed through various environments throughout hospital with distant supervision assist from PT: hall of  hospital [id number],58f 120ft, 36fital gift shop x 200ft, c3ft sidewalk at hospital entrance 2x 100ft. Pt67fed to have 1 near LOB with sudden turn to the R, but able to correct without additional assist from PT.   Dynamic balance training to complete obstacle course x 2 with supervision assist from  PT. Stepping over 6 obstacles (1-5 in tall) weave through 4 cones, ambulate over unlevel mat surface, and up/down curb step(6in). Min cues for improved step height to prevent fall by tripping over 5in obstacles.   Throughout treatment, pt performed sit<>stand x 10 and stand pivot transfers x 2 without Assist or cues from PT   Patient returned to room and left sitting in WC with cS. Manuel Olson bell in reach and all needs met.         Therapy Documentation Precautions:  Precautions Precautions: Fall Restrictions Weight Bearing Restrictions: No Vital Signs: Therapy Vitals Pulse Rate: 70 BP: 133/69 Patient Position (if appropriate): Sitting Pain: Pain Assessment Pain Scale: 0-10 Pain Score: 6  Pain Type: Acute pain;Chronic pain;Surgical pain Pain Location: Back Pain Orientation: Lower Pain Intervention(s): Medication (See eMAR)  See Function Navigator for Current Functional Status.   Therapy/Group: Individual Therapy  Valori Hollenkamp E Lorie Phenix9, 11:01 AM

## 2018-03-02 ENCOUNTER — Inpatient Hospital Stay (HOSPITAL_COMMUNITY): Payer: Medicare Other | Admitting: Physical Therapy

## 2018-03-02 ENCOUNTER — Inpatient Hospital Stay (HOSPITAL_COMMUNITY): Payer: Medicare Other | Admitting: Occupational Therapy

## 2018-03-02 LAB — GLUCOSE, CAPILLARY
Glucose-Capillary: 105 mg/dL — ABNORMAL HIGH (ref 65–99)
Glucose-Capillary: 116 mg/dL — ABNORMAL HIGH (ref 65–99)
Glucose-Capillary: 134 mg/dL — ABNORMAL HIGH (ref 65–99)
Glucose-Capillary: 41 mg/dL — CL (ref 65–99)
Glucose-Capillary: 73 mg/dL (ref 65–99)
Glucose-Capillary: 94 mg/dL (ref 65–99)

## 2018-03-02 MED ORDER — CARVEDILOL 3.125 MG PO TABS: ORAL_TABLET | ORAL | 2 refills | Status: DC

## 2018-03-02 MED ORDER — ATORVASTATIN CALCIUM 20 MG PO TABS: 20.0000 mg | ORAL_TABLET | Freq: Every day | ORAL | 1 refills | Status: DC

## 2018-03-02 MED ORDER — GABAPENTIN 100 MG PO CAPS: 200.0000 mg | ORAL_CAPSULE | Freq: Two times a day (BID) | ORAL | 0 refills | Status: DC

## 2018-03-02 MED ORDER — OXYCODONE HCL 5 MG PO TABS: 5.0000 mg | ORAL_TABLET | ORAL | 0 refills | Status: DC | PRN

## 2018-03-02 MED ORDER — BACLOFEN 10 MG PO TABS: 10.0000 mg | ORAL_TABLET | Freq: Three times a day (TID) | ORAL | 0 refills | Status: DC | PRN

## 2018-03-02 MED ORDER — LISINOPRIL 10 MG PO TABS: 10.0000 mg | ORAL_TABLET | Freq: Every day | ORAL | 2 refills | Status: DC

## 2018-03-02 MED ORDER — INSULIN GLARGINE 100 UNIT/ML ~~LOC~~ SOLN
10.0000 [IU] | Freq: Every day | SUBCUTANEOUS | Status: DC
  Filled 2018-03-02: qty 0.1

## 2018-03-02 MED ORDER — PANTOPRAZOLE SODIUM 40 MG PO TBEC: 40.0000 mg | DELAYED_RELEASE_TABLET | Freq: Every day | ORAL | 3 refills | Status: DC

## 2018-03-02 MED ORDER — GLUCOSE 4 G PO CHEW
CHEWABLE_TABLET | ORAL | Status: AC
  Administered 2018-03-02: 3
  Filled 2018-03-02: qty 1

## 2018-03-02 MED ORDER — INSULIN GLARGINE 100 UNITS/ML SOLOSTAR PEN: 22.0000 [IU] | PEN_INJECTOR | Freq: Every day | SUBCUTANEOUS | 11 refills | Status: DC

## 2018-03-02 MED ORDER — INSULIN GLARGINE 100 UNITS/ML SOLOSTAR PEN: 10.0000 [IU] | PEN_INJECTOR | Freq: Every day | SUBCUTANEOUS | 11 refills | Status: DC

## 2018-03-02 NOTE — Progress Notes (Signed)
Staples removed according to orders. Steristrips placed. No drainage. Patient tolerated well.

## 2018-03-02 NOTE — IPOC Note (Signed)
Overall Plan of Care [facility]) Patient Details Name: Manuel Olson MRN: [id number] DOB: 1946/03/03  Admitting Diagnosis: <principal problem not specified> debility  Hospital Problems: Active Problems:   Debility     Functional Problem List: Nursing Pain, Safety, Skin Integrity, Nutrition, Motor, Endurance  PT Balance, Endurance, Pain, Skin Integrity  OT Balance, Endurance  SLP    TR         Basic ADL's: OT Bathing, Dressing, Toileting     Advanced  ADL's: OT       Transfers: PT Bed to Chair, Car, Furniture, Floor  OT Toilet, Metallurgist: PT Ambulation, Stairs     Additional Impairments: OT None  SLP        TR      Anticipated Outcomes Item Anticipated Outcome  Self Feeding no goal - pt is I  Swallowing      Basic self-care  mod I  Toileting  no goal - pt is mod I now   Bathroom Transfers mod I  Bowel/Bladder  Patient will continue to be continent of bowel and bladder during admission  Transfers  Mod I   Locomotion  Ambulatory at mod I level   Communication     Cognition     Pain  Patient will be pain free or pain less than 3 during admission  Safety/Judgment  Patient will be free from falls and adhere to safety plan   Therapy Plan: PT Intensity: Minimum of 1-2 x/day ,45 to 90 minutes PT Frequency: 5 out of 7 days PT Duration Estimated Length of Stay: 3-5 days  OT Intensity: Minimum of 1-2 x/day, 45 to 90 minutes OT Frequency: 5 out of 7 days OT Duration/Estimated Length of Stay: 3-5 days      Team Interventions: Nursing Interventions Patient/Family Education, Pain Management, Skin Care/Wound Management, Psychosocial Support, Medication Management, Disease Management/Prevention  PT interventions Ambulation/gait training, Training and development officer, Community reintegration, Discharge planning, Disease management/prevention, DME/adaptive equipment instruction, Functional mobility training, Neuromuscular re-education, Pain  management, Patient/family education, Psychosocial support, Therapeutic Exercise, Splinting/orthotics, Skin care/wound management, Therapeutic Activities, Stair training, UE/LE Strength taining/ROM, UE/LE Coordination activities, Visual/perceptual remediation/compensation, Wheelchair propulsion/positioning  OT Interventions Training and development officer, Discharge planning, DME/adaptive equipment instruction, Functional mobility training, Patient/family education, Self Care/advanced ADL retraining, Therapeutic Activities, Therapeutic Exercise  SLP Interventions    TR Interventions    SW/CM Interventions Discharge Planning, Psychosocial Support, Patient/Family Education   Barriers to Discharge MD  Medical stability  Nursing      PT Inaccessible home environment, Medical stability, Pending chemo/radiation    OT      SLP      SW       Team Discharge Planning: Destination: PT-Home ,OT- Home , SLP-  Projected Follow-up: PT-Outpatient PT, OT-  None, SLP-  Projected Equipment Needs: PT-To be determined, OT- 3 in 1 bedside comode, Tub/shower seat, SLP-  Equipment Details: PT- , OT-  Patient/family involved in discharge planning: PT- Patient,  OT-Patient, Family member/caregiver, SLP-   MD ELOS: 4 days Medical Rehab Prognosis:  Excellent Assessment: The patient has been admitted for CIR therapies with the diagnosis of debility. The team will be addressing functional mobility, strength, stamina, balance, safety, adaptive techniques and equipment, self-care, bowel and bladder mgt, patient and caregiver education, pain mgt, ego support, community reentry. Goals have been set at mod I for mobility and self-care.    Meredith Staggers, MD, [facility]      See Team Conference Notes for weekly updates to the plan  of care

## 2018-03-02 NOTE — Progress Notes (Signed)
Physical Therapy Discharge Summary  Patient Details  Name: Manuel Olson MRN: [id number] Date of Birth: July 07, 1946  Today's Date: 03/02/2018 PT Individual Time: 1050-1200 AND 1600-1700   70 min and 60 min    Patient has met 11 of 11 long term goals due to improved activity tolerance, improved balance, increased strength and decreased pain.  Patient to discharge at an ambulatory level Modified Independent.   Patient's care partner is independent to provide the necessary physical assistance at discharge.  Reasons goals not met: All PT goals met.   Recommendation:  Patient will benefit from ongoing skilled PT services in outpatient setting to continue to advance safe functional mobility, address ongoing impairments in balance, endurance, strength, and minimize fall risk.  Equipment: RW  Reasons for discharge: treatment goals met and discharge from hospital  Patient/family agrees with progress made and goals achieved: Yes  Session 1.  Pt received sitting in WC and agreeable to PT. PT instructed pt in Grad day assessment to measure progress toward goals. See below for details. Patient returned to room and left sitting in [facility] with call bell in reach and all needs met.    Session 2.  Pt received sitting in WC and agreeable to PT. PT instructed pt in gait training through various environments without assist or cues from PT through hall of hospital, over unlevel cement side walk, up/down handicap ramp to parking garage. PT also instructed pt in dynamic balance training while engaged in wii sports bowling and [employer] Fit balance board overall, no assist or cues required from PT except one near LOB with end range anterior weight shift on balance board. Stepping strategy to prevent LOB by pt off board. Patient returned to room and left sitting in [facility] with call bell in reach and all needs met.       PT Discharge Vital Signs Therapy Vitals Pulse Rate: 66 BP: (!) 159/76 Pain Pain  Assessment Pain Scale: 0-10 Pain Score: 0-No pain Vision/Perception  Perception Perception: Within Functional Limits Praxis Praxis: Intact  Cognition Overall Cognitive Status: Within Functional Limits for tasks assessed Orientation Level: Oriented X4 Sensation Sensation Hot/Cold: Appears Intact Proprioception: Appears Intact Coordination Gross Motor Movements are Fluid and Coordinated: Yes Fine Motor Movements are Fluid and Coordinated: Yes Motor  Motor Motor: Within Functional Limits  Mobility Bed Mobility Bed Mobility: Rolling Right;Supine to Sit;Sit to Supine;Rolling Left Rolling Right: Supervision/verbal cueing;Independent with assistive device Rolling Left: Independent with assistive device Supine to Sit: Independent with assistive device Sit to Supine: Independent with assistive device Transfers Transfers: Sit to Stand;Stand Pivot Transfers Sit to Stand: Independent with assistive device Stand Pivot Transfers: Independent with assistive device Stand Pivot Transfer Details: Verbal cues for precautions/safety Transfer (Assistive device): None Locomotion  Gait Ambulation: Yes Gait Assistance: Independent Gait Distance (Feet): 500 Feet Assistive device: None Gait Gait Pattern: Within Functional Limits Stairs / Additional Locomotion Stairs: Yes Stairs Assistance: Independent with assistive device Stair Management Technique: One rail Right Number of Stairs: 16 Height of Stairs: 6 Ramp: Independent Curb: Independent with assistive device Wheelchair Mobility Wheelchair Mobility: No  Trunk/Postural Assessment  Cervical Assessment Cervical Assessment: Within Functional Limits Thoracic Assessment Thoracic Assessment: Within Functional Limits Lumbar Assessment Lumbar Assessment: Within Functional Limits Postural Control Postural Control: Within Functional Limits  Balance Balance Balance Assessed: Yes Berg Balance Test Sit to Stand: Able to stand without  using hands and stabilize independently Standing Unsupported: Able to stand safely 2 minutes Sitting with Back Unsupported but Feet Supported on Floor or  Stool: Able to sit safely and securely 2 minutes Stand to Sit: Sits safely with minimal use of hands Transfers: Able to transfer safely, minor use of hands Standing Unsupported with Eyes Closed: Able to stand 10 seconds safely Standing Ubsupported with Feet Together: Able to place feet together independently and stand 1 minute safely From Standing, Reach Forward with Outstretched Arm: Can reach confidently >25 cm (10") From Standing Position, Pick up Object from Floor: Able to pick up shoe safely and easily From Standing Position, Turn to Look Behind Over each Shoulder: Looks behind from both sides and weight shifts well Turn 360 Degrees: Able to turn 360 degrees safely in 4 seconds or less Standing Unsupported, Alternately Place Feet on Step/Stool: Able to stand independently and safely and complete 8 steps in 20 seconds Standing Unsupported, One Foot in Front: Able to plae foot ahead of the other independently and hold 30 seconds Standing on One Leg: Able to lift leg independently and hold equal to or more than 3 seconds Total Score: 53 Dynamic Sitting Balance Dynamic Sitting - Level of Assistance: 7: Independent Static Standing Balance Static Standing - Level of Assistance: 7: Independent Dynamic Standing Balance Dynamic Standing - Level of Assistance: 5: Stand by assistance;6: Modified independent (Device/Increase time)  Extremity Assessment      RLE Assessment RLE Assessment: Within Functional Limits LLE Assessment LLE Assessment: Within Functional Limits   See Function Navigator for Current Functional Status.  Lorie Phenix 03/02/2018, 11:49 AM

## 2018-03-02 NOTE — Discharge Summary (Signed)
Discharge summary job [id number]

## 2018-03-02 NOTE — Progress Notes (Signed)
Occupational Therapy Discharge Summary  Patient Details  Name: Manuel Olson MRN: [id number] Date of Birth: 18-Jan-1946  Patient has met 5 of 5 long term goals due to improved activity tolerance, improved balance and improved awareness.  Patient to discharge at overall Independent- Modified Independent level.  Patient's care partner is independent to provide the necessary intermittent assistance at discharge.    Reasons goals not met: N/A  Recommendation:  Patient will not require follow up OT at this time.  Equipment: shower seat  Reasons for discharge: treatment goals met and discharge from hospital  Patient/family agrees with progress made and goals achieved: Yes   See Function Navigator for Current Functional Status.  Simonne Come 03/02/2018, 10:54 AM

## 2018-03-02 NOTE — Discharge Instructions (Signed)
Inpatient Rehab Discharge Instructions  Manuel Olson Discharge date and time: No discharge date for patient encounter.   Activities/Precautions/ Functional Status: Activity: activity as tolerated Diet: diabetic diet Wound Care: keep wound clean and dry Functional status:  ___ No restrictions     ___ Walk up steps independently ___ 24/7 supervision/assistance   ___ Walk up steps with assistance ___ Intermittent supervision/assistance  ___ Bathe/dress independently ___ Walk with walker     _x__ Bathe/dress with assistance ___ Walk Independently    ___ Shower independently ___ Walk with assistance    ___ Shower with assistance ___ No alcohol     ___ Return to work/school ________  Special Instructions: No smoking driving or alcohol  Follow-up with Dr. Barry Dienes on when to resume aspirin therapy   COMMUNITY REFERRALS UPON DISCHARGE:    Outpatient: PT Agency:  [name]  PHONE: [phone]    Appointment Date/Time:JUNE 25 Tuesday 4:00-5:00 PM  Medical Equipment/Items Ordered:ROLLING [name] GET TUB BENCH ON OWN  Agency/Supplier:[facility]   [phone]    My questions have been answered and I understand these instructions. I will adhere to these goals and the provided educational materials after my discharge from the hospital.  Patient/Caregiver Signature _______________________________ Date __________  Clinician Signature _______________________________________ Date __________  Please bring this form and your medication list with you to all your follow-up doctor's appointments.

## 2018-03-02 NOTE — Progress Notes (Signed)
Social Work  Discharge Note  The overall goal for the admission was met for: DC Sat 6/22  Discharge location: Yes-HOME WITH WIFE WHO CAN PROVIDE 24 HR SUPERVISION  Length of Stay: Yes-4 DAYS  Discharge activity level: Yes-MOD/I-SUPERVISION  Home/community participation: Yes  Services provided included: MD, RD, PT, OT, RN, CM, Pharmacy and SW  Financial Services: Medicare and Private Insurance: [place]  Follow-up services arranged: Outpatient: [facility] 6/25 3:45-4:45 PM, DME: [facility]RO[facility] and Patient/Family has no preference for HH/DME agencies  Comments (or additional information):WIFE HAS BEEN STAYING HERE AND PARTICIPATED IN THERAPIES WITH HIM. WANTS HIM TO BE MEDICALLY STABLE BEFORE GOING HOME  Patient/Family verbalized understanding of follow-up arrangements: Yes  Individual responsible for coordination of the follow-up plan: SELF & KAREN-WIFE  Confirmed correct DME delivered: [name] 03/02/2018    [name]

## 2018-03-02 NOTE — Progress Notes (Signed)
Occupational Therapy Session Note  Patient Details  Name: NIV DARLEY MRN: [id number] Date of Birth: 08/23/1946  Today's Date: 03/02/2018 OT Individual Time: 0930-1030 OT Individual Time Calculation (min): 60 min    Short Term Goals: Week 1:  OT Short Term Goal 1 (Week 1): STGs = LTGs  Skilled Therapeutic Interventions/Progress Updates:    Completed ADL retraining at overall Mod I to independent level.  Pt ambulated around room and into bathroom without AD and no LOB.  Pt doffed pants and underwear at sit > stand level demonstrating improved safety awareness.  Completed bathing at sit > stand level from shower chair in room shower at Mod I level.  Pt reports appreciating shower chair for energy conservation and for increased safety when washing lower legs and feet.  LB dressing completed at sit > stand level.  Ambulated to Dayroom without AD and overall good balance reactions as he navigated obstacles.  Engaged in balance activity on Biodex with pt demonstrating good weight shift endurance.  Returned to room as above and left in w/c with wife present.  Pt appreciative of all help and reports ready for d/c home.  Therapy Documentation Precautions:  Precautions Precautions: Fall Restrictions Weight Bearing Restrictions: No General:   Vital Signs: Therapy Vitals Pulse Rate: 66 BP: (!) 159/76 Pain: Pain Assessment Pain Scale: 0-10 Pain Score: 0-No pain  See Function Navigator for Current Functional Status.   Therapy/Group: Individual Therapy  Simonne Come 03/02/2018, 10:51 AM

## 2018-03-02 NOTE — Progress Notes (Signed)
PHYSICAL MEDICINE & REHABILITATION     PROGRESS NOTE    Subjective/Complaints: Feeling better today. Didn't tolerate oxycodone yesterday--made him feel dizzy and disoriented.   ROS: Patient denies fever, rash, sore throat, blurred vision, nausea, vomiting, diarrhea, cough, shortness of breath or chest pain, joint or back pain, headache, or mood change.   Objective:  No results found. Recent Labs    02/28/18 0705  WBC 10.5  HGB 9.4*  HCT 28.9*  PLT 137*   Recent Labs    02/28/18 0705  NA 135  K 3.6  CL 102  GLUCOSE 104*  BUN 16  CREATININE 1.34*  CALCIUM 8.8*   CBG (last 3)  Recent Labs    03/01/18 2359 03/02/18 0413 03/02/18 0532  GLUCAP 94 41* 105*    Wt Readings from Last 3 Encounters:  02/27/18 73.9 kg (162 lb 14.7 oz)  02/19/18 73.9 kg (162 lb 14.7 oz)  02/09/18 71.2 kg (157 lb)     Intake/Output Summary (Last 24 hours) at 03/02/2018 1038 Last data filed at 03/02/2018 0750 Gross per 24 hour  Intake 720 ml  Output -  Net 720 ml    Vital Signs: Blood pressure (!) 159/76, pulse 66, temperature (!) 97.5 F (36.4 C), temperature source Oral, resp. rate 17, height 5' 9"  (1.753 m), weight 73.9 kg (162 lb 14.7 oz), SpO2 97 %. Physical Exam:  Constitutional: No distress . Vital signs reviewed. HEENT: EOMI, oral membranes moist Neck: supple Cardiovascular: RRR without murmur. No JVD    Respiratory: CTA Bilaterally without wheezes or rales. Normal effort    GI: BS +, non-tender, non-distended  Musculoskeletal:Normal range of motion. He exhibits noedema.  Neurological: He isalertand oriented to person, place, and time. UE 4+/5 prox to distal. LE:4- HF, 4 KE and 4/5 ADF/PF--exam stable Skin: Skin iswarmand dry. He isnot diaphoretic.  Psychiatric: pleasant    Assessment/Plan: 1. Functional deficits secondary to pancreatic cancer/surgery/TAVR which require 3+ hours per day of interdisciplinary therapy in a comprehensive inpatient  rehab setting. Physiatrist is providing close team supervision and 24 hour management of active medical problems listed below. Physiatrist and rehab team continue to assess barriers to discharge/monitor patient progress toward functional and medical goals.  Function:  Bathing Bathing position   Position: Shower  Bathing parts Body parts bathed by patient: Right arm, Left arm, Chest, Abdomen, Front perineal area, Buttocks, Right upper leg, Back, Left lower leg, Right lower leg, Left upper leg    Bathing assist Assist Level: Supervision or verbal cues      Upper Body Dressing/Undressing Upper body dressing   What is the patient wearing?: Pull over shirt/dress     Pull over shirt/dress - Perfomed by patient: Thread/unthread right sleeve, Thread/unthread left sleeve, Put head through opening, Pull shirt over trunk          Upper body assist Assist Level: Set up      Lower Body Dressing/Undressing Lower body dressing   What is the patient wearing?: Underwear, Pants, Socks, Shoes Underwear - Performed by patient: Thread/unthread right underwear leg, Thread/unthread left underwear leg, Pull underwear up/down   Pants- Performed by patient: Thread/unthread right pants leg, Thread/unthread left pants leg, Pull pants up/down       Socks - Performed by patient: Don/doff right sock, Don/doff left sock   Shoes - Performed by patient: Don/doff right shoe, Don/doff left shoe, Fasten right, Fasten left            Lower body assist Assist for  lower body dressing: Supervision or verbal cues      Toileting Toileting   Toileting steps completed by patient: Adjust clothing prior to toileting, Performs perineal hygiene, Adjust clothing after toileting      Toileting assist Assist level: More than reasonable time   Transfers Chair/bed transfer   Chair/bed transfer method: Stand pivot Chair/bed transfer assist level: No Help, no cues, assistive device, takes more than a reasonable  amount of time Chair/bed transfer assistive device: Armrests     Locomotion Ambulation     Max distance: 532f  Assist level: Supervision or verbal cues   Wheelchair   Type: Manual Max wheelchair distance: 1517fAssist Level: Supervision or verbal cues  Cognition Comprehension Comprehension assist level: Follows complex conversation/direction with extra time/assistive device  Expression Expression assist level: Expresses complex ideas: With extra time/assistive device  Social Interaction Social Interaction assist level: Interacts appropriately with others - No medications needed.  Problem Solving Problem solving assist level: Solves complex problems: Recognizes & self-corrects  Memory Memory assist level: Complete Independence: No helper   Medical Problem List and Plan: 1.Debilitysecondary to complication related to pancreatic cancer/left radical nephrectomy 02/19/2018 as well as recent TAVR procedure -continue therapies.   -ELOS 6/22  -follow up with primary/surgery as outpt 2. DVT Prophylaxis/Anticoagulation: Lovenox. Monitor for any bleeding episodes 3. Pain Management:Neurontin 200 mg twice daily, baclofen 10 mg 3 times daily as needed -dc oxycodone due to reaction  -tylenol for breakthrough pain 4. Mood:Provide emotional support 5. Neuropsych: This patientiscapable of making decisions on hisown behalf. 6. Skin/Wound Care:Routine skin checks 7. Fluids/Electrolytes/Nutrition:  -intake improved 8.Acute blood loss anemia. Follow-up hgb 9.4 9.Diabetes mellitus. Hemoglobin A1c 8.2. Lantus insulin 22 units nightly.   -hypoglycemic this moring  -ate 100% meals yesterday  -back off lantus to 10 units tonight  10.Hypertension. Lisinopril 10 mg daily, Coreg 3.125 mg twice daily. 11.Hyperlipidemia. Lipitor 12.CAD/MI. Patient on aspirin and Plavix prior to admission. plavix resumed  LOS (Days) 3 A FACE TO FACE EVALUATION WAS  PERFORMED  ZaMeredith StaggersMD 03/02/2018 10:38 AM

## 2018-03-02 NOTE — Discharge Summary (Signed)
Manuel, Olson MEDICAL RECORD [id number] ACCOUNT [id number] DATE OF BIRTH:[birth year] FACILITY: [facility] LOCATION: [facility] PHYSICIAN:ANKIT PATEL, MD  DISCHARGE SUMMARY  DATE OF DISCHARGE:  03/03/2018  ADMIT DATE:  02/27/2018  DISCHARGE DATE:  03/03/2018  DISCHARGE DIAGNOSES: 1.  Debilitation related to pancreatic cancer with left radical nephrectomy 02/19/2018 as well as recent transcatheter aortic valve replacement procedure. 2.  Subcutaneous Lovenox for deep venous thrombosis prophylaxis. 3.  Pain management.   4.  Acute blood loss anemia. 5.  Diabetes mellitus. 6.  Hypertension. 7.  Hyperlipidemia. 8.  Coronary artery disease with myocardial infarction.  HOSPITAL COURSE:  A 72 year old right-handed male with history of CAD with MI maintained on aspirin and Plavix, diabetes mellitus, former smoker.  Lives with wife.  Independent prior to admission.  Presented 02/19/2018 with recent diagnosis of pancreatic  cancer incidentally identified while undergoing workup for aortic valve surgery.  The patient had TAVR procedure.  Then, neoadjuvant treatment as well as left renal cancer.  Underwent pancreaticoduodenectomy with portal vein resection and placement of  biliary and pancreatic duct stents 02/19/2018 per Dr. Barry Olson as well as left radical nephrectomy per Dr. Alyson Olson.  HOSPITAL COURSE:  Anemia.  The patient was transfused later placed on Lovenox for DVT prophylaxis.  Intermittent bouts of confusion that continued to improve.  Pathology report remains pending.  The patient was admitted for comprehensive rehabilitation  program.  PAST MEDICAL HISTORY:  See discharge diagnoses.  SOCIAL HISTORY:  Lives with spouse, independent prior to admission.  FUNCTIONAL STATUS:  Upon admission to rehab services was minimal assist of 500 feet rolling walker, minimal guard sit to stand, min assist with activities of daily living.  PHYSICAL EXAMINATION: VITAL SIGNS:  Blood pressure  166/76, pulse 72, temperature 98, respirations 16. GENERAL:  Alert male in no acute distress.  EOMs intact. NECK:  Supple, nontender, no JVD. CARDIOVASCULAR:  Rate controlled. ABDOMEN:  Soft, nontender, good bowel sounds. LUNGS:  Clear to auscultation without wheeze. ABDOMEN:  Incision clean and dry with staples intact.  REHABILITATION HOSPITAL COURSE:  The patient was admitted to inpatient rehabilitation services.  Therapies initiated on a 3-hour daily basis, consisting of physical therapy, occupational therapy and rehabilitation nursing.  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 Barry Olson as well as Dr. Alyson Olson.  Recent AVR TAVR procedure,  follow up outpatient cardiology services.  The patient remained on Plavix therapy.  Aspirin on hold per surgery.  No chest pain or shortness of breath.  Acute blood loss anemia 9.4.  Blood sugars with some hypoglycemia and insulin adjustment.  Monitored hemoglobin A1c of 8.2  Remaining on insulin therapy.  Blood  pressures with lisinopril and Coreg.  The patient received weekly collaborative interdisciplinary team conferences to discuss estimated length of stay, family teaching, any barriers to discharge.  The patient's strength and endurance continued to improve  throughout the course, ambulating extended distances minimal assistance, working with energy conservation, monitoring of blood pressure when out of bed, standing balance greatly improved.  He was able to fully communicate his needs.  Ambulate up to  greater than 400 feet.  Gather belongings for activities of daily living at home making.  It was discussed no driving.  He was discharged to home after family teaching completed.  DISCHARGE MEDICATIONS:  Included Lipitor 20 mg p.o. daily, Coreg 3.125 mg p.o. b.i.d., Plavix 75 mg p.o.  daily, Neurontin 200 mg p.o. b.i.d., Lantus insulin 10 units at bedtime, lisinopril 10 mg p.o. daily, Protonix 40 mg p.o. daily   Diet was mechanical soft.  The patient will follow up with Dr. Delice Olson at the outpatient rehabilitation service office as needed.  Dr. Barry Olson general surgery, call for appointment.  Dr. Nicolette Olson, call for appointment.  Dr. Rozann Olson,  medical management.  SPECIAL INSTRUCTIONS:  Follow up with general surgery on when to resume aspirin therapy.  TN/NUANCE D:03/02/2018 T:03/02/2018 JOB:[id number]

## 2018-03-02 NOTE — Significant Event (Signed)
Hypoglycemic Event  CBG: 41  Treatment: 3 glucose tabs and 15 GM carbohydrate snack  Symptoms: None  Follow-up CBG: Time:0532 CBG Result:105  Possible Reasons for Event: Medication regimen: lantus  Comments/MD notified: [place], [place]    Elisea Khader O Jakim Drapeau

## 2018-03-03 ENCOUNTER — Inpatient Hospital Stay (HOSPITAL_COMMUNITY): Payer: Medicare Other

## 2018-03-03 LAB — GLUCOSE, CAPILLARY
Glucose-Capillary: 107 mg/dL — ABNORMAL HIGH (ref 65–99)
Glucose-Capillary: 127 mg/dL — ABNORMAL HIGH (ref 65–99)

## 2018-03-03 MED ORDER — LISINOPRIL 20 MG PO TABS
20.0000 mg | ORAL_TABLET | Freq: Every day | ORAL | Status: DC
  Administered 2018-03-03: 20 mg via ORAL
  Filled 2018-03-03: qty 1

## 2018-03-03 NOTE — Progress Notes (Addendum)
[place] PHYSICAL MEDICINE & REHABILITATION     PROGRESS NOTE    Subjective/Complaints:  No issues overnite, still sore around abd incision  ROS: Patient denies fever, rash, sore throat, blurred vision, nausea, vomiting, diarrhea, cough, shortness of breath or chest pain, joint or back pain, headache, or mood change.   Objective:  No results found. No results for input(s): WBC, HGB, HCT, PLT in the last 72 hours. No results for input(s): NA, K, CL, GLUCOSE, BUN, CREATININE, CALCIUM in the last 72 hours.  Invalid input(s): CO CBG (last 3)  Recent Labs    03/02/18 2011 03/03/18 0007 03/03/18 0516  GLUCAP 73 127* 107*    Wt Readings from Last 3 Encounters:  02/27/18 73.9 kg (162 lb 14.7 oz)  02/19/18 73.9 kg (162 lb 14.7 oz)  02/09/18 71.2 kg (157 lb)     Intake/Output Summary (Last 24 hours) at 03/03/2018 0755 Last data filed at 03/02/2018 1826 Gross per 24 hour  Intake 480 ml  Output -  Net 480 ml    Vital Signs: Blood pressure (!) 180/80, pulse 67, temperature 98.5 F (36.9 C), temperature source Oral, resp. rate 18, height 5' 9"  (1.753 m), weight 73.9 kg (162 lb 14.7 oz), SpO2 98 %. Physical Exam:  Constitutional: No distress . Vital signs reviewed. HEENT: EOMI, oral membranes moist Neck: supple Cardiovascular: RRR without murmur. No JVD    Respiratory: CTA Bilaterally without wheezes or rales. Normal effort    GI: BS +, non-tender, non-distended  Musculoskeletal:Normal range of motion. He exhibits noedema.  Neurological: He isalertand oriented to person, place, and time. UE 4+/5 prox to distal. LE:4- HF, 4 KE and 4/5 ADF/PF--exam stable Skin: Skin iswarmand dry. He isnot diaphoretic.the midline abd incision without drainage , steristrips intact  Psychiatric: pleasant    Assessment/Plan: 1. Functional deficits secondary to pancreatic cancer/surgery/TAVR  Stable for D/C today F/u PCP in 3-4 weeks F/u Onc 1 weeks See D/C summary See D/C  instructionsFunction:  Bathing Bathing position   Position: Shower  Bathing parts Body parts bathed by patient: Right arm, Left arm, Chest, Abdomen, Front perineal area, Buttocks, Right upper leg, Back, Left lower leg, Right lower leg, Left upper leg    Bathing assist Assist Level: Assistive device Assistive Device Comment: shower seat    Upper Body Dressing/Undressing Upper body dressing   What is the patient wearing?: Pull over shirt/dress     Pull over shirt/dress - Perfomed by patient: Thread/unthread right sleeve, Thread/unthread left sleeve, Put head through opening, Pull shirt over trunk          Upper body assist Assist Level: No help, No cues      Lower Body Dressing/Undressing Lower body dressing   What is the patient wearing?: Underwear, Pants, Shoes Underwear - Performed by patient: Thread/unthread right underwear leg, Thread/unthread left underwear leg, Pull underwear up/down   Pants- Performed by patient: Thread/unthread right pants leg, Thread/unthread left pants leg, Pull pants up/down       Socks - Performed by patient: Don/doff right sock, Don/doff left sock   Shoes - Performed by patient: Don/doff right shoe, Don/doff left shoe, Fasten right, Fasten left            Lower body assist Assist for lower body dressing: No Help, No cues      Toileting Toileting   Toileting steps completed by patient: Adjust clothing prior to toileting, Performs perineal hygiene, Adjust clothing after toileting      Toileting assist Assist level: More  than reasonable time   Transfers Chair/bed transfer   Chair/bed transfer method: Stand pivot Chair/bed transfer assist level: No Help, no cues, assistive device, takes more than a reasonable amount of time Chair/bed transfer assistive device: Armrests     Locomotion Ambulation     Max distance: 5103f Assist level: No help, No cues, assistive device, takes more than a reasonable amount of time   Wheelchair  Wheelchair activity did not occur: N/A Type: Manual Max wheelchair distance: 1542fAssist Level: Supervision or verbal cues  Cognition Comprehension Comprehension assist level: Follows complex conversation/direction with extra time/assistive device  Expression Expression assist level: Expresses complex ideas: With extra time/assistive device  Social Interaction Social Interaction assist level: Interacts appropriately with others - No medications needed.  Problem Solving Problem solving assist level: Solves complex problems: Recognizes & self-corrects  Memory Memory assist level: Complete Independence: No helper   Medical Problem List and Plan: 1.Debilitysecondary to complication related to pancreatic cancer/left radical nephrectomy 02/19/2018 as well as recent TAVR procedure D/C home today   -follow up with primary/surgery as outpt 2. DVT Prophylaxis/Anticoagulation: Lovenox. Monitor for any bleeding episodes 3. Pain Management:Neurontin 200 mg twice daily, baclofen 10 mg 3 times daily as needed -dc oxycodone due to reaction  -tylenol for breakthrough pain 4. Mood:Provide emotional support 5. Neuropsych: This patientiscapable of making decisions on hisown behalf. 6. Skin/Wound Care:Routine skin checks 7. Fluids/Electrolytes/Nutrition:  -intake improved 8.Acute blood loss anemia. Follow-up hgb 9.4 9.Diabetes mellitus. Hemoglobin A1c 8.2. Lantus insulin 22 units nightly.   -hypoglycemic this moring  -ate 100% meals yesterday  -back off lantus to 10 units tonight CBG (last 3)  Recent Labs    03/02/18 2011 03/03/18 0007 03/03/18 0516  GLUCAP 73 127* 107*  Controlled 6/22  10.Hypertension. Lisinopril 10 mg daily, Coreg 3.125 mg twice daily. Vitals:   03/02/18 2013 03/03/18 0516  BP: (!) 162/74 (!) 180/80  Pulse: 65 67  Resp: 18 18  Temp: 98.2 F (36.8 C) 98.5 F (36.9 C)  SpO2: 98% 98%  would not increase coreg  , f/u with  PCP 11.Hyperlipidemia. Lipitor 12.CAD/MI. Patient on aspirin and Plavix prior to admission. plavix resumed  LOS (Days) 4 A FACE TO FACE EVALUATION WAS PERFORMED  AnCharlett BlakeMD 03/03/2018 7:55 AM

## 2018-03-04 ENCOUNTER — Inpatient Hospital Stay (HOSPITAL_COMMUNITY): Payer: Medicare Other | Admitting: Physical Therapy

## 2018-03-06 ENCOUNTER — Inpatient Hospital Stay: Payer: Medicare Other | Attending: Oncology | Admitting: Oncology

## 2018-03-06 ENCOUNTER — Telehealth: Payer: Self-pay | Admitting: Oncology

## 2018-03-06 VITALS — BP 173/91 | HR 82 | Temp 98.3°F | Resp 18 | Ht 69.0 in | Wt 148.3 lb

## 2018-03-06 DIAGNOSIS — I350 Nonrheumatic aortic (valve) stenosis: Secondary | ICD-10-CM

## 2018-03-06 DIAGNOSIS — I252 Old myocardial infarction: Secondary | ICD-10-CM | POA: Diagnosis not present

## 2018-03-06 DIAGNOSIS — E119 Type 2 diabetes mellitus without complications: Secondary | ICD-10-CM | POA: Insufficient documentation

## 2018-03-06 DIAGNOSIS — I10 Essential (primary) hypertension: Secondary | ICD-10-CM | POA: Diagnosis not present

## 2018-03-06 DIAGNOSIS — C642 Malignant neoplasm of left kidney, except renal pelvis: Secondary | ICD-10-CM | POA: Diagnosis not present

## 2018-03-06 DIAGNOSIS — Z87891 Personal history of nicotine dependence: Secondary | ICD-10-CM | POA: Diagnosis not present

## 2018-03-06 DIAGNOSIS — C250 Malignant neoplasm of head of pancreas: Secondary | ICD-10-CM | POA: Diagnosis not present

## 2018-03-06 DIAGNOSIS — R911 Solitary pulmonary nodule: Secondary | ICD-10-CM | POA: Diagnosis not present

## 2018-03-06 DIAGNOSIS — I2510 Atherosclerotic heart disease of native coronary artery without angina pectoris: Secondary | ICD-10-CM | POA: Diagnosis not present

## 2018-03-06 NOTE — Telephone Encounter (Signed)
Scheduled appt per 6/25 los - gave patient aVS and calender per los.

## 2018-03-06 NOTE — Progress Notes (Signed)
[place] OFFICE PROGRESS NOTE   Diagnosis: Pancreas cancer  INTERVAL HISTORY:   Manuel Olson returns as scheduled.  He underwent diagnostic laparoscopy and pancreaticoduodenectomy with portal vein resection on 02/19/2018.  A 2 cm mass was noted in the head of the pancreas.  A nodule was removed from the liver.  The frozen section on the nodule returned negative.  The pancreas was adherent to a portion of the portal vein.  This required resection of a portal vein segment.  Dr. Alyson Ingles performed a left radical nephrectomy.  The pathology ([id number] dose (revealed no evidence of malignancy involving a right liver biopsy.  The pancreas was involved with an invasive moderately appreciated adenocarcinoma measuring 2 cm with extrapancreatic extension.  Adenocarcinoma was present at the adherent portal vein surface with negative portal vein margins.  Perineural and lymphovascular invasion were identified.  Metastatic carcinoma was identified in 2 of 18 lymph nodes.  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  The resection margins are negative.  The adrenal gland is unremarkable.  Manuel Olson was discharged from the hospital 03/03/2018 after a rehabilitation stay.  He reports tolerating a diet.  The stool is soft, but he has no diarrhea.  No pain.  No neuropathy symptoms.  Objective:  Vital signs in last 24 hours:  Blood pressure (!) 173/91, pulse 82, temperature 98.3 F (36.8 C), temperature source Oral, resp. rate 18, height 5' 9"  (1.753 m), weight 148 lb 4.8 oz (67.3 kg), SpO2 98 %.    Resp: Lungs clear bilaterally Cardio: Regular rate and rhythm GI: Healed midline incision with Steri-Strips in place, no hepatomegaly, nontender Vascular: No leg edema    Portacath/PICC-without erythema  Lab Results:  Lab Results  Component Value Date   WBC 10.5 02/28/2018   HGB 9.4 (L) 02/28/2018   HCT 28.9 (L) 02/28/2018   MCV 93.5 02/28/2018   PLT 137  (L) 02/28/2018   NEUTROABS 7.8 (H) 02/28/2018    CMP  Lab Results  Component Value Date   NA 135 02/28/2018   K 3.6 02/28/2018   CL 102 02/28/2018   CO2 27 02/28/2018   GLUCOSE 104 (H) 02/28/2018   BUN 16 02/28/2018   CREATININE 1.34 (H) 02/28/2018   CALCIUM 8.8 (L) 02/28/2018   PROT 5.3 (L) 02/28/2018   ALBUMIN 2.9 (L) 02/28/2018   AST 19 02/28/2018   ALT 23 02/28/2018   ALKPHOS 73 02/28/2018   BILITOT 1.0 02/28/2018   GFRNONAA 51 (L) 02/28/2018   GFRAA 59 (L) 02/28/2018    No results found for: CEA1  Lab Results  Component Value Date   INR 1.03 02/09/2018    Imaging:  No results found.  Medications: I have reviewed the patient's current medications.   Assessment/Plan: 1. Adenocarcinoma of the pancreas head/neck, status post an EUS biopsy 08/02/2017 confirming adenocarcinoma ? EUS 08/02/2017-pancreas head/neck mass, abutment of the portal vein, no peripancreatic adenopathy ? MRI abdomen 07/27/2017-head/neck pancreas mass with mass-effect on the portal splenic confluence, no involvement of the celiac axis or superior mesenteric artery, no evidence of metastatic disease ? PET scan 08/31/2017-very hypermetabolism at the pancreatic head mass, no evidence of metastatic disease. The isolated right lower lobe nodule below PET resolution, left sided renal mass with decreased activity relative to the normal adjacent kidney ? Cycle 1 FOLFIRINOX 09/16/2017 ? Cycle 2 FOLFOX 10/16/2017 ? Cycle 3 FOLFOX 10/30/2017 ? Cycle 4 FOLFIRINOX 11/14/2017 (irinotecan resumed with a dose reduction) ? Cycle 5 FOLFIRINOX3/20/2019 (Oxaliplatin  dose reduced due to thrombocytopenia) ? Cycle 6 FOLFIRINOX4/11/2017 ? CTs 12/25/2017-stable right lower lobe nodule, decrease in size of the pancreas head mass, stable left kidney mass ? Pancreaticoduodenectomy and left nephrectomy 02/19/2018,[id number], 2/18 lymph nodes positive, lymphovascular and perineural invasion present, mild to moderate treatment effect,  tumor involved adherent portal vein with negative portal vein margins  2. 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 Barry Dienes  9. Nausea, diarrhea, and abdominal pain-chemotherapy enteritis versus bowel obstruction versus ischemic enteritis  10. Dehydrationsecondary to nausea and diarrhea-resolved  11. History of pancytopenia secondary to chemotherapy-improved   Disposition: [name] underwent a pancreaticoduodenectomy on 02/19/2018.  He appears to have experienced an excellent operative recovery.  I reviewed the details of the surgical pathology report with him.  His case was presented at the GI tumor conference last week.  The final resection margins are negative. He remains at significant risk of developing recurrent pancreas cancer.  I recommend completing several additional months of adjuvant chemotherapy.  He tolerated the FOLFIRINOX well.  We will plan for 4-6 additional cycles of FOLFIRINOX depending on tolerance.  He will be scheduled to resume FOLFIRINOX on 03/21/2018.  He will be scheduled for an office and lab visit that day.  25 minutes were spent with the patient today.  The majority of the time was used for counseling and coordination of care.  [name], MD  03/06/2018  4:02 PM

## 2018-03-07 ENCOUNTER — Telehealth: Payer: Self-pay | Admitting: Cardiology

## 2018-03-07 NOTE — Telephone Encounter (Signed)
Per wife, patient saw oncologist on yesterday BP was elevated. Does not check blood pressures at home. Patient was advised to contact his cardiologist for an appointment regarding elevated blood pressure. Per review of chart at Dr. Gearldine Shown office BP was 173/91. No c/o dizziness, headache, sob or chest pain. Currently taking. Medications reconciled. Message sent to MD for advice.

## 2018-03-07 NOTE — Telephone Encounter (Signed)
Patient's wife is calling with concerns regarding BP. / tg

## 2018-03-07 NOTE — Telephone Encounter (Signed)
Patient's wife informed. Wife advised to check blood pressures an hour after BP medication, using same cuff, same arm, and after sitting for 5-10 minutes. Record readings and call office in one week with readings. Advised that if BP readings are elevated for 3 consecutive days, to contact our office sooner than one week. Wife verbalized understanding of plan. Wife also advised to have patient sign a DPR when back at the office.

## 2018-03-07 NOTE — Telephone Encounter (Signed)
I reviewed his medication list, he is on lisinopril at 10 mg daily as well as low-dose Coreg.  Based on one elevated blood pressure reading, it is not entirely clear how we should make changes in his therapy however.  I would recommend seeing if they can get a home blood pressure cuff to obtain more readings and then schedule a nurse blood pressure check in the next few weeks.  We may simply need to increase his lisinopril further.

## 2018-03-09 ENCOUNTER — Ambulatory Visit (INDEPENDENT_AMBULATORY_CARE_PROVIDER_SITE_OTHER): Payer: Medicare Other | Admitting: Internal Medicine

## 2018-03-09 ENCOUNTER — Telehealth: Payer: Self-pay | Admitting: Cardiology

## 2018-03-09 ENCOUNTER — Encounter: Payer: Self-pay | Admitting: Internal Medicine

## 2018-03-09 VITALS — BP 150/88 | HR 81 | Ht 69.0 in | Wt 148.2 lb

## 2018-03-09 DIAGNOSIS — E782 Mixed hyperlipidemia: Secondary | ICD-10-CM

## 2018-03-09 DIAGNOSIS — E1159 Type 2 diabetes mellitus with other circulatory complications: Secondary | ICD-10-CM

## 2018-03-09 DIAGNOSIS — I255 Ischemic cardiomyopathy: Secondary | ICD-10-CM

## 2018-03-09 DIAGNOSIS — E1165 Type 2 diabetes mellitus with hyperglycemia: Secondary | ICD-10-CM | POA: Diagnosis not present

## 2018-03-09 MED ORDER — BASAGLAR KWIKPEN 100 UNIT/ML ~~LOC~~ SOPN: 14.0000 [IU] | PEN_INJECTOR | Freq: Every day | SUBCUTANEOUS | 5 refills | Status: DC

## 2018-03-09 MED ORDER — INSULIN LISPRO 100 UNIT/ML (KWIKPEN): 4.0000 [IU] | PEN_INJECTOR | Freq: Three times a day (TID) | SUBCUTANEOUS | 5 refills | Status: DC

## 2018-03-09 NOTE — Telephone Encounter (Signed)
Patient's wife is calling back with BP readings per nurse request. / tg

## 2018-03-09 NOTE — Telephone Encounter (Signed)
Wife reports that recently had major surgery and feels light headed. She was told to call Dr. Domenic Polite with current BP readings. Thur 185/85, Fri, 168/92 Wife will continue to monitor.

## 2018-03-09 NOTE — Progress Notes (Signed)
Patient ID: Manuel Olson, male   DOB: March 11, 1946, 72 y.o.   MRN: [id number]   HPI: Manuel Olson is a 72 y.o.-year-old male, initially referred by his oncologist, Dr. Benay Spice, returning for follow-up for DM2, dx in 01/2016, insulin-dependent, uncontrolled, with complications (CAD - s/p AMI 11/2016 - s/p 4x DES; Ao stenosis). He saw Dr. Dorris Fetch before.  Last visit 3 months ago.  He is here with his wife who offers a significant amount of history, especially regarding his recent surgery, diabetes medicines and his sugars at home.  Patient was diagnosed with pancreatic cancer in 07/2017 and started chemotherapy in 09/2017.  Since then, he also had a kidney removed for renal cell carcinoma  and at the same time, he had pancreatic resection (head of Pancreas) - 02/19/2018.  It was found that he had metastases into lymph nodes.  He will have chemotherapy again.  In the hospital, Basal insulin was decreased and rapid acting insulin was stopped.   Last hemoglobin A1c was: Lab Results  Component Value Date   HGBA1C 6.4 (H) 02/09/2018   HGBA1C 8.2 (H) 11/28/2017   HGBA1C 10.4 (H) 08/17/2017   Pt was on a regimen of: - Glipizide 5 mg daily  - Tresiba 20 units at bedtime (samples, cannot afford this)    He is currently on:  - Basaglar >> Lantus 20 >> 10 units at bedtime -     Pt was checking his sugars 2-4X a day, per his very good log: - am: 82, 128-242 >> 60-125, 188 >> 67, 77-140, 150, 172 - 2h after b'fast: n/c - before lunch: 140-301 >> 95-151, 164 >> n/c - 2h after lunch: n/c - before dinner:  63, 91-144, 158, 212 (ChTx) >> 107-162, 222, 243, 291 - 2h after dinner: n/c - bedtime: 224-341 >> 97-161, 224 (ChTx) >> 98-176 - nighttime: n/c Lowest sugar was 82 >> 60 >> 41; it is unclear at which level he has hypoglycemia awareness Highest sugar was 398 >> 224 >> 290.  Glucometer: True Metrix  Pt's meals are: - Breakfast: bacon + eggs + English + Gatorade/V8/Boost - Lunch:  grilled ham and cheese sandwich + potato chips - Dinner: chicken + veggies + starch - Snacks: fruit cup, pudding  - 1x a day  -+ CKD, last BUN/creatinine:  Lab Results  Component Value Date   BUN 16 02/28/2018   BUN 23 (H) 02/26/2018   CREATININE 1.34 (H) 02/28/2018   CREATININE 1.38 (H) 02/26/2018  On lisinopril. -+ HL; last set of lipids: Lab Results  Component Value Date   CHOL 102 03/16/2017   HDL 36 (L) 03/16/2017   LDLCALC 52 03/16/2017   TRIG 71 03/16/2017   CHOLHDL 2.8 03/16/2017  On Lipitor 20. - last eye exam was in 2018: No DR no DR."I had a stroke in my eye". + history of laser surgeries. -No numbness but has tingling in his feet  Pt has FH of DM in brother.  He also has a history of hypertension.  ROS: Constitutional: + weight loss, + fatigue, no subjective hyperthermia, no subjective hypothermia, + nocturia Eyes: no blurry vision, no xerophthalmia ENT: no sore throat, no nodules palpated in throat, no dysphagia, no odynophagia, no hoarseness Cardiovascular: no CP/no SOB/no palpitations/no leg swelling Respiratory: no cough/no SOB/no wheezing Gastrointestinal: no N/no V/no D/no C/no acid reflux Musculoskeletal: no muscle aches/no joint aches Skin: no rashes, no hair loss Neurological: no tremors/no numbness/no tingling/+ dizziness  I reviewed pt's medications, allergies, PMH, social hx,  family hx, and changes were documented in the history of present illness. Otherwise, unchanged from my initial visit note.  Past Medical History:  Diagnosis Date  . Arthritis   . CAD (coronary artery disease)    a. 11/2016 in [place]: STEMI s/p DES to mid LAD, DES to diagonal, DES x 2 to proximal and mid RCA   . Essential hypertension   . Former smoker   . GERD (gastroesophageal reflux disease)   . Heart murmur   . Hyperlipidemia   . Myocardial infarction ([place])   . Pancreatic cancer ([place])   . Pneumonia   . Renal mass   . S/P TAVR (transcatheter aortic valve replacement)     a. 08/2017:  Edwards Sapien 3 THV (size 26 mm, model #9600CM26A , serial # [id number])  . Severe aortic stenosis    a. 08/2017: s/p TAVR by Dr. Angelena Form and Dr. Cyndia Bent.   . 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 Type 2 diabetes mellitus ([place])    Past Surgical History:  Procedure Laterality Date  . APPENDECTOMY    . CARDIAC CATHETERIZATION    . EUS N/A 08/02/2017   Procedure: UPPER ENDOSCOPIC ULTRASOUND (EUS) RADIAL;  Surgeon: [name], MD;  [facility];  Service: Endoscopy;  Laterality: N/A;  . EYE SURGERY Left   . HAND SURGERY Bilateral   . HERNIA REPAIR Right   . LAPAROSCOPY N/A 02/19/2018   Procedure: DIAGNOSTIC LAPAROSCOPY, ERAS PATHWAY;  Surgeon: [name], MD;  Location: [place];  Service: General;  Laterality: N/A;  . NEPHRECTOMY Left 02/19/2018   Procedure: OPEN LEFT RADICAL NEPHRECTOMY;  Surgeon: Cleon Gustin, MD;  Location: [place];  Service: Urology;  Laterality: Left;  . PORTACATH PLACEMENT N/A 09/15/2017   Procedure: INSERTION PORT-A-CATH;  Surgeon: [name], MD;  Location: [place];  Service: General;  Laterality: N/A;  . RIGHT/LEFT HEART CATH AND CORONARY ANGIOGRAPHY N/A 07/05/2017   Procedure: RIGHT/LEFT HEART CATH AND CORONARY ANGIOGRAPHY;  Surgeon: [name], MD;  Location: [place] CV LAB;  Service: Cardiovascular;  Laterality: N/A;  . SHOULDER ARTHROSCOPY WITH ROTATOR CUFF REPAIR Left   . TEE WITHOUT CARDIOVERSION N/A 08/22/2017   Procedure: TRANSESOPHAGEAL ECHOCARDIOGRAM (TEE);  Surgeon: Burnell Blanks, MD;  Location: [place];  Service: Open Heart Surgery;  Laterality: N/A;  . TRANSCATHETER AORTIC VALVE REPLACEMENT, TRANSFEMORAL N/A 08/22/2017   Procedure: TRANSCATHETER AORTIC VALVE REPLACEMENT, TRANSFEMORAL;  Surgeon: Burnell Blanks, MD;  Location: [place];  Service: Open Heart Surgery;  Laterality: N/A;  . WHIPPLE PROCEDURE N/A 02/19/2018   Procedure: WHIPPLE PROCEDURE;  Surgeon: [name], MD;   Location: [place];  Service: General;  Laterality: N/A;   Social History   Socioeconomic History  . Marital status: Married    Spouse name: Not on file  . Number of children: Not on file  Social Needs  Occupational History  .  Retired  Tobacco Use  . Smoking status: Former Smoker    Packs/day: 1.00    Years: 57.00    Pack years: 57.00    Types: Cigarettes    Start date: 09/12/1958    Last attempt to quit: 2018    Years since quitting: 1.5  . Smokeless tobacco: Never Used  Substance and Sexual Activity  . Alcohol use: No  . Drug use: No   Current Outpatient Medications on File Prior to Visit  Medication Sig Dispense Refill  . acetaminophen (TYLENOL) 325 MG tablet Take 2 tablets (650  mg total) by mouth every 6 (six) hours as needed for mild pain, fever or headache. 100 tablet 0  . atorvastatin (LIPITOR) 20 MG tablet Take 1 tablet (20 mg total) by mouth daily. 90 tablet 1  . baclofen (LIORESAL) 10 MG tablet Take 1 tablet (10 mg total) by mouth 3 (three) times daily as needed for muscle spasms. (Patient not taking: Reported on 03/06/2018) 30 each 0  . carvedilol (COREG) 3.125 MG tablet TAKE (1) TABLET BY MOUTH TWICE A DAY WITH MEALS (BREAKFAST AND SUPPER) 180 tablet 2  . clopidogrel (PLAVIX) 75 MG tablet Take 1 tablet (75 mg total) by mouth daily. (Patient taking differently: Take 75 mg by mouth daily with breakfast. ) 90 tablet 1  . gabapentin (NEURONTIN) 100 MG capsule Take 2 capsules (200 mg total) by mouth 2 (two) times daily. 60 capsule 0  . insulin glargine (LANTUS) 100 unit/mL SOPN Inject 0.1 mLs (10 Units total) into the skin at bedtime. 15 mL 11  . lisinopril (PRINIVIL,ZESTRIL) 10 MG tablet Take 1 tablet (10 mg total) by mouth daily. 90 tablet 2  . pantoprazole (PROTONIX) 40 MG tablet Take 1 tablet (40 mg total) by mouth daily at 12 noon. 30 tablet 3   No current facility-administered medications on file prior to visit.    No Known Allergies Family History  Problem Relation  Age of Onset  . Lupus Mother   . CAD Brother   . Hypertension Brother     PE: BP (!) 150/88   Pulse 81   Ht 5' 9"  (1.753 m)   Wt 148 lb 3.2 oz (67.2 kg)   SpO2 96%   BMI 21.89 kg/m  Wt Readings from Last 3 Encounters:  03/09/18 148 lb 3.2 oz (67.2 kg)  03/06/18 148 lb 4.8 oz (67.3 kg)  02/27/18 162 lb 14.7 oz (73.9 kg)   Constitutional: Thin, in NAD, + temporal wasting Eyes: PERRLA, EOMI, no exophthalmos ENT: moist mucous membranes, no thyromegaly, no cervical lymphadenopathy Cardiovascular: RRR, No MRG Respiratory: CTA B Gastrointestinal: abdomen soft, NT, ND, BS+ Musculoskeletal: no deformities, strength intact in all 4 Skin: moist, warm, no rashes Neurological: + Slight tremor with outstretched hands, DTR normal in all 4  ASSESSMENT: 1. DM2, non-insulin-dependent, uncontrolled, with complications - CAD - s/p AMI 11/2016 - s/p DES x4 -  Ao stenosis  2. HL  PLAN:  1. Patient with history of initially controlled diabetes, with deteriorating control around the time of his pancreatic cancer diagnosis at the end of 2018.  He was initially on glipizide, with good control, but after his diagnosis, his sugars were worse and we had to start insulin.  She is currently on Basaglar and Humalog.  We discussed about increasing Humalog around the time of his chemotherapy.  We also change from sugary drinks to Glucerna at last visit in an effort to improve his blood sugars further.  At last visit, sugars were at or slightly above goal with some sugars in the 60s and we were planning to decrease the Basaglar if he continues to have lows.  He had a recent HbA1c from last month, which was excellent, at 6.4%.  However, he also had a low blood sugar, at 41 on 03/02/2018.  His Humalog was held in the hospital and his basal insulin dose was decreased to 50%. - At this visit, sugars have increased after his Basaglar was reduced from 20 units to 10 units and also after stopping Humalog.  His sugars in  the morning  are now above goal and sugars after dinner are quite high, in the 200s.  We discussed about adding back Humalog at 4 units, but increase to 6 as needed.  We also have to increase his Basaglar, but not to 20 units, only to 14 units. - I suggested to:  Patient Instructions  Please increase: - Basaglar 14 units at bedtime  Restart: - Humalog 4-6 units before meals  Please return in 3-4 months with your sugar log.   - continue checking sugars at different times of the day - check 3x a day, rotating checks - advised for yearly eye exams >> he is UTD - Return to clinic in 3-4 mo with sugar log    2. HL - Reviewed latest lipid panel from 03/2017: LDL at goal, HDL slightly low - Continues Lipitor without side effects.   Philemon Kingdom, MD PhD [facility] Endocrinology

## 2018-03-09 NOTE — Patient Instructions (Addendum)
Please increase: - Basaglar 14 units at bedtime  Restart: - Humalog 4-6 units before meals  Please return in 3-4 months with your sugar log.

## 2018-03-12 ENCOUNTER — Other Ambulatory Visit: Payer: Self-pay | Admitting: Cardiology

## 2018-03-12 NOTE — Telephone Encounter (Signed)
We may need to increase his lisinopril dose if this trend continues.

## 2018-03-14 NOTE — Telephone Encounter (Signed)
Called pt for an update on current BP readings. NA, left msg to call back.

## 2018-03-16 MED ORDER — CARVEDILOL 6.25 MG PO TABS: ORAL_TABLET | ORAL | 3 refills | Status: DC

## 2018-03-16 NOTE — Telephone Encounter (Signed)
BP readings starting on 6/27 thru 7/4 : 185/85, 168/92, 133/75, 126/73, 154/81, 135/79, 128/71, 157/84

## 2018-03-16 NOTE — Telephone Encounter (Signed)
Spoke with wife, they will increase Coreg to 6.25 mg BID (they are using up their 3.125 mg first) and will call when rx for 6.25 mg needs to be called in

## 2018-03-16 NOTE — Telephone Encounter (Signed)
Hold off on increasing lisinopril dose for now with recent mild renal insufficiency.  Try and increase Coreg to 6.25 mg twice daily.

## 2018-03-16 NOTE — Addendum Note (Signed)
Addended by: Barbarann Ehlers A on: 03/16/2018 03:09 PM   Modules accepted: Orders

## 2018-03-17 ENCOUNTER — Other Ambulatory Visit: Payer: Self-pay | Admitting: Oncology

## 2018-03-19 ENCOUNTER — Other Ambulatory Visit: Payer: Self-pay | Admitting: *Deleted

## 2018-03-19 ENCOUNTER — Inpatient Hospital Stay: Payer: Medicare Other | Attending: Oncology

## 2018-03-19 ENCOUNTER — Inpatient Hospital Stay (HOSPITAL_BASED_OUTPATIENT_CLINIC_OR_DEPARTMENT_OTHER): Payer: Medicare Other | Admitting: Medical

## 2018-03-19 VITALS — BP 146/91 | HR 78 | Temp 97.5°F | Resp 18 | Ht 69.0 in | Wt 140.6 lb

## 2018-03-19 DIAGNOSIS — C642 Malignant neoplasm of left kidney, except renal pelvis: Secondary | ICD-10-CM | POA: Insufficient documentation

## 2018-03-19 DIAGNOSIS — Z87891 Personal history of nicotine dependence: Secondary | ICD-10-CM | POA: Insufficient documentation

## 2018-03-19 DIAGNOSIS — I951 Orthostatic hypotension: Secondary | ICD-10-CM | POA: Diagnosis not present

## 2018-03-19 DIAGNOSIS — E860 Dehydration: Secondary | ICD-10-CM | POA: Insufficient documentation

## 2018-03-19 DIAGNOSIS — E119 Type 2 diabetes mellitus without complications: Secondary | ICD-10-CM | POA: Insufficient documentation

## 2018-03-19 DIAGNOSIS — R627 Adult failure to thrive: Secondary | ICD-10-CM | POA: Diagnosis not present

## 2018-03-19 DIAGNOSIS — C250 Malignant neoplasm of head of pancreas: Secondary | ICD-10-CM

## 2018-03-19 DIAGNOSIS — I10 Essential (primary) hypertension: Secondary | ICD-10-CM | POA: Insufficient documentation

## 2018-03-19 LAB — CBC WITH DIFFERENTIAL (CANCER CENTER ONLY)
Basophils Absolute: 0 10*3/uL (ref 0.0–0.1)
Basophils Relative: 0 %
Eosinophils Absolute: 0.1 10*3/uL (ref 0.0–0.5)
Eosinophils Relative: 1 %
HCT: 37.8 % — ABNORMAL LOW (ref 38.4–49.9)
Hemoglobin: 12.5 g/dL — ABNORMAL LOW (ref 13.0–17.1)
Lymphocytes Relative: 16 %
Lymphs Abs: 1.3 10*3/uL (ref 0.9–3.3)
MCH: 30.1 pg (ref 27.2–33.4)
MCHC: 33 g/dL (ref 32.0–36.0)
MCV: 91.2 fL (ref 79.3–98.0)
Monocytes Absolute: 1.1 10*3/uL — ABNORMAL HIGH (ref 0.1–0.9)
Monocytes Relative: 12 %
Neutro Abs: 6 10*3/uL (ref 1.5–6.5)
Neutrophils Relative %: 71 %
Platelet Count: 162 10*3/uL (ref 140–400)
RBC: 4.15 MIL/uL — ABNORMAL LOW (ref 4.20–5.82)
RDW: 15.8 % — ABNORMAL HIGH (ref 11.0–14.6)
WBC Count: 8.5 10*3/uL (ref 4.0–10.3)

## 2018-03-19 LAB — CMP (CANCER CENTER ONLY)
ALT: 24 U/L (ref 0–44)
AST: 22 U/L (ref 15–41)
Albumin: 4.4 g/dL (ref 3.5–5.0)
Alkaline Phosphatase: 109 U/L (ref 38–126)
Anion gap: 8 (ref 5–15)
BUN: 23 mg/dL (ref 8–23)
CO2: 26 mmol/L (ref 22–32)
Calcium: 10.4 mg/dL — ABNORMAL HIGH (ref 8.9–10.3)
Chloride: 95 mmol/L — ABNORMAL LOW (ref 98–111)
Creatinine: 1.84 mg/dL — ABNORMAL HIGH (ref 0.61–1.24)
GFR, Est AFR Am: 40 mL/min — ABNORMAL LOW (ref >60)
GFR, Estimated: 34 mL/min — ABNORMAL LOW (ref >60)
Glucose, Bld: 102 mg/dL — ABNORMAL HIGH (ref 70–99)
Potassium: 4.5 mmol/L (ref 3.5–5.1)
Sodium: 129 mmol/L — ABNORMAL LOW (ref 135–145)
Total Bilirubin: 0.5 mg/dL (ref 0.3–1.2)
Total Protein: 7.4 g/dL (ref 6.5–8.1)

## 2018-03-19 MED ORDER — HEPARIN SOD (PORK) LOCK FLUSH 100 UNIT/ML IV SOLN
500.0000 [IU] | Freq: Once | INTRAVENOUS | Status: AC
  Administered 2018-03-19: 500 [IU] via INTRAVENOUS
  Filled 2018-03-19: qty 5

## 2018-03-19 MED ORDER — SODIUM CHLORIDE 0.9% FLUSH
10.0000 mL | INTRAVENOUS | Status: DC | PRN
  Administered 2018-03-19: 10 mL via INTRAVENOUS
  Filled 2018-03-19: qty 10

## 2018-03-19 MED ORDER — SODIUM CHLORIDE 0.9 % IV SOLN
Freq: Once | INTRAVENOUS | Status: AC
  Administered 2018-03-19: 13:00:00 via INTRAVENOUS

## 2018-03-19 NOTE — Progress Notes (Signed)
Symptoms Management Clinic Progress Note   Manuel Olson [id number] [birth year] 72 y.o.  Manuel Olson is managed by Dr. Dominica Severin B. [place]  Actively treated with chemotherapy/immunotherapy: no  Current Therapy: Tentatively scheduled to begin FOLFOX on 03/21/2018.  Assessment: Plan:    Dehydration - Plan: 0.9 %  sodium chloride infusion  Adenocarcinoma of head of pancreas (HCC) - Plan: heparin lock flush 100 unit/mL, sodium chloride flush (NS) 0.9 % injection 10 mL   Dehydration: The patient received 1 liter of normal saline today.  Adenocarcinoma of the pancrease: The patient will follow up with Dr. Benay Spice on Wednesday to discuss ongoing care.   Please see After Visit Summary for patient specific instructions.  Future Appointments  Date Time Provider [place]  03/26/2018  1:45 PM [facility] LAB 6 [facility] None  03/26/2018  2:00 PM [facility] [place] None  03/26/2018  2:45 PM Owens Shark, NP [facility] None  03/26/2018  3:00 PM [facility] INFUSION [facility] None  04/23/2018 12:00 PM Satira Sark, MD [facility] [place] [facility]  06/25/2018  1:00 PM Philemon Kingdom, MD [facility] None    No orders of the defined types were placed in this encounter.      Subjective:   Patient ID:  Manuel Olson is a 72 y.o. (DOB 02/27/1946) male.  Chief Complaint:  Chief Complaint  Patient presents with  . Dehydration    HPI Manuel Olson is a 72 year old male with a history of pancreatic cancer who is status post a diagnostic laparoscopy and pancreaticoduodenectomy with portal vein resection on 02/19/2018.  A 2 cm mass was noted in the head of the pancreas.  A nodule was removed from the liver.  The frozen section on the nodule returned negative.  The pancreas was adherent to a portion of the portal vein.  This required resection of a portal vein segment.  Dr. Alyson Ingles performed a left radical nephrectomy.  The patient is followed  by Dr. Dominica Severin B. Benay Spice and was last seen by him on 03/06/2018.  He was seen by his surgeon earlier today.  The patient was referred to the symptom management clinic for "failure to thrive" and for the possible need an IV.  The patient reports that he is dehydrated with anorexia, dizziness, weakness, and fatigue.  He states he is not recovering well from his surgery.  He was scheduled to have chemotherapy on Wednesday and is concerned that he is healthy enough to receive that.  He denies nausea, vomiting, or diarrhea.  Medications: I have reviewed the patient's current medications.  Allergies: No Known Allergies  Past Medical History:  Diagnosis Date  . Arthritis   . CAD (coronary artery disease)    a. 11/2016 in [place]: STEMI s/p DES to mid LAD, DES to diagonal, DES x 2 to proximal and mid RCA   . Essential hypertension   . Former smoker   . GERD (gastroesophageal reflux disease)   . Heart murmur   . Hyperlipidemia   . Myocardial infarction ([place])   . Pancreatic cancer ([place])   . Pneumonia   . Renal mass   . S/P TAVR (transcatheter aortic valve replacement)    a. 08/2017:  Edwards Sapien 3 THV (size 26 mm, model #9600CM26A , serial # [id number])  . Severe aortic stenosis    a. 08/2017: s/p TAVR by Dr. Angelena Form and Dr. Cyndia Bent.   . 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 Type 2 diabetes mellitus ([place])  Past Surgical History:  Procedure Laterality Date  . APPENDECTOMY    . CARDIAC CATHETERIZATION    . EUS N/A 08/02/2017   Procedure: UPPER ENDOSCOPIC ULTRASOUND (EUS) RADIAL;  Surgeon: [name], MD;  [facility];  Service: Endoscopy;  Laterality: N/A;  . EYE SURGERY Left   . HAND SURGERY Bilateral   . HERNIA REPAIR Right   . LAPAROSCOPY N/A 02/19/2018   Procedure: DIAGNOSTIC LAPAROSCOPY, ERAS PATHWAY;  Surgeon: [name], MD;  Location: [place];  Service: General;  Laterality: N/A;  . NEPHRECTOMY Left 02/19/2018   Procedure: OPEN LEFT RADICAL NEPHRECTOMY;  Surgeon:  Cleon Gustin, MD;  Location: [place];  Service: Urology;  Laterality: Left;  . PORTACATH PLACEMENT N/A 09/15/2017   Procedure: INSERTION PORT-A-CATH;  Surgeon: [name], MD;  Location: [place];  Service: General;  Laterality: N/A;  . RIGHT/LEFT HEART CATH AND CORONARY ANGIOGRAPHY N/A 07/05/2017   Procedure: RIGHT/LEFT HEART CATH AND CORONARY ANGIOGRAPHY;  Surgeon: [name], MD;  Location: [place] CV LAB;  Service: Cardiovascular;  Laterality: N/A;  . SHOULDER ARTHROSCOPY WITH ROTATOR CUFF REPAIR Left   . TEE WITHOUT CARDIOVERSION N/A 08/22/2017   Procedure: TRANSESOPHAGEAL ECHOCARDIOGRAM (TEE);  Surgeon: Burnell Blanks, MD;  Location: [place];  Service: Open Heart Surgery;  Laterality: N/A;  . TRANSCATHETER AORTIC VALVE REPLACEMENT, TRANSFEMORAL N/A 08/22/2017   Procedure: TRANSCATHETER AORTIC VALVE REPLACEMENT, TRANSFEMORAL;  Surgeon: Burnell Blanks, MD;  Location: [place];  Service: Open Heart Surgery;  Laterality: N/A;  . WHIPPLE PROCEDURE N/A 02/19/2018   Procedure: WHIPPLE PROCEDURE;  Surgeon: [name], MD;  Location: [facility];  Service: General;  Laterality: N/A;    Family History  Problem Relation Age of Onset  . Lupus Mother   . CAD Brother   . Hypertension Brother     Social History   Socioeconomic History  . Marital status: Married    Spouse name: Not on file  . Number of children: Not on file  . Years of education: Not on file  . Highest education level: Not on file  Occupational History  . Not on file  Social Needs  . Financial resource strain: Not on file  . Food insecurity:    Worry: Not on file    Inability: Not on file  . Transportation needs:    Medical: Not on file    Non-medical: Not on file  Tobacco Use  . Smoking status: Former Smoker    Packs/day: 1.00    Years: 57.00    Pack years: 57.00    Types: Cigarettes    Start date: 09/12/1958    Last attempt to quit: 04/12/2016    Years since quitting: 1.9    . Smokeless tobacco: Never Used  Substance and Sexual Activity  . Alcohol use: No  . Drug use: No  . Sexual activity: Not on file  Lifestyle  . Physical activity:    Days per week: Not on file    Minutes per session: Not on file  . Stress: Not on file  Relationships  . Social connections:    Talks on phone: Not on file    Gets together: Not on file    Attends religious service: Not on file    Active member of club or organization: Not on file    Attends meetings of clubs or organizations: Not on file    Relationship status: Not on file  . Intimate partner violence:    Fear of current  or ex partner: Not on file    Emotionally abused: Not on file    Physically abused: Not on file    Forced sexual activity: Not on file  Other Topics Concern  . Not on file  Social History Narrative  . Not on file    Past Medical History, Surgical history, Social history, and Family history were reviewed and updated as appropriate.   Please see review of systems for further details on the patient's review from today.   Review of Systems:  Review of Systems  Constitutional: Positive for appetite change and fatigue. Negative for chills, diaphoresis and fever.  HENT: Negative for mouth sores and trouble swallowing.   Respiratory: Negative for cough, choking, chest tightness, shortness of breath and wheezing.   Cardiovascular: Negative for chest pain, palpitations and leg swelling.  Gastrointestinal: Negative for constipation, diarrhea, nausea and vomiting.  Neurological: Positive for dizziness and weakness.    Objective:   Physical Exam:  BP (!) 146/91 (BP Location: Left Arm, Patient Position: Sitting)   Pulse 78   Temp (!) 97.5 F (36.4 C) (Oral)   Resp 18   Ht 5' 9"  (1.753 m)   Wt 140 lb 9.6 oz (63.8 kg)   SpO2 100%   BMI 20.76 kg/m  ECOG: 1  Physical Exam  Constitutional: No distress.  HENT:  Head: Normocephalic and atraumatic.  Mouth/Throat: Oropharynx is clear and moist.   Cardiovascular: Normal rate, regular rhythm and normal heart sounds. Exam reveals no gallop and no friction rub.  No murmur heard. Pulmonary/Chest: Effort normal and breath sounds normal. No respiratory distress. He has no wheezes. He has no rales.  Abdominal: Soft. Bowel sounds are normal. He exhibits no distension and no mass. There is no tenderness. There is no rebound and no guarding.    Musculoskeletal: He exhibits no edema.  Neurological: He is alert.  Skin: Skin is warm and dry. He is not diaphoretic.    Lab Review:     Component Value Date/Time   NA 127 (L) 03/21/2018 0759   NA 139 09/07/2017 0844   K 4.1 03/21/2018 0759   K 5.4 No visable hemolysis (H) 09/07/2017 0844   CL 94 (L) 03/21/2018 0759   CO2 21 (L) 03/21/2018 0759   CO2 27 09/07/2017 0844   GLUCOSE 143 (H) 03/21/2018 0759   GLUCOSE 227 (H) 09/07/2017 0844   BUN 20 03/21/2018 0759   BUN 29.8 (H) 09/07/2017 0844   CREATININE 1.66 (H) 03/21/2018 0759   CREATININE 1.2 09/07/2017 0844   CALCIUM 9.9 03/21/2018 0759   CALCIUM 9.8 09/07/2017 0844   PROT 6.9 03/21/2018 0759   PROT 6.9 09/07/2017 0844   ALBUMIN 4.2 03/21/2018 0759   ALBUMIN 3.9 09/07/2017 0844   AST 18 03/21/2018 0759   AST 13 09/07/2017 0844   ALT 19 03/21/2018 0759   ALT 12 09/07/2017 0844   ALKPHOS 109 03/21/2018 0759   ALKPHOS 89 09/07/2017 0844   BILITOT 0.6 03/21/2018 0759   BILITOT 0.37 09/07/2017 0844   GFRNONAA 39 (L) 03/21/2018 0759   GFRAA 45 (L) 03/21/2018 0759       Component Value Date/Time   WBC 9.6 03/21/2018 0759   WBC 10.5 02/28/2018 0705   RBC 3.76 (L) 03/21/2018 0759   HGB 11.3 (L) 03/21/2018 0759   HGB 12.7 (L) 09/07/2017 0844   HCT 33.9 (L) 03/21/2018 0759   HCT 38.7 09/07/2017 0844   PLT 145 03/21/2018 0759   PLT 161 09/07/2017 0844  MCV 90.2 03/21/2018 0759   MCV 91.8 09/07/2017 0844   MCH 30.1 03/21/2018 0759   MCHC 33.3 03/21/2018 0759   RDW 14.8 (H) 03/21/2018 0759   RDW 14.0 09/07/2017 0844    LYMPHSABS 1.6 03/21/2018 0759   LYMPHSABS 1.3 09/07/2017 0844   MONOABS 0.9 03/21/2018 0759   MONOABS 0.9 09/07/2017 0844   EOSABS 0.2 03/21/2018 0759   EOSABS 0.4 09/07/2017 0844   BASOSABS 0.0 03/21/2018 0759   BASOSABS 0.1 09/07/2017 0844   -------------------------------  Imaging from last 24 hours (if applicable):  Radiology interpretation: No results found.      This case was discussed with Dr. Benay Spice. He expressed agreement with my management of this patient.

## 2018-03-19 NOTE — Patient Instructions (Signed)
Dehydration, Adult Dehydration is when there is not enough fluid or water in your body. This happens when you lose more fluids than you take in. Dehydration can range from mild to very bad. It should be treated right away to keep it from getting very bad. Symptoms of mild dehydration may include:  Thirst.  Dry lips.  Slightly dry mouth.  Dry, warm skin.  Dizziness. Symptoms of moderate dehydration may include:  Very dry mouth.  Muscle cramps.  Dark pee (urine). Pee may be the color of tea.  Your body making less pee.  Your eyes making fewer tears.  Heartbeat that is uneven or faster than normal (palpitations).  Headache.  Light-headedness, especially when you stand up from sitting.  Fainting (syncope). Symptoms of very bad dehydration may include:  Changes in skin, such as: ? Cold and clammy skin. ? Blotchy (mottled) or pale skin. ? Skin that does not quickly return to normal after being lightly pinched and let go (poor skin turgor).  Changes in body fluids, such as: ? Feeling very thirsty. ? Your eyes making fewer tears. ? Not sweating when body temperature is high, such as in hot weather. ? Your body making very little pee.  Changes in vital signs, such as: ? Weak pulse. ? Pulse that is more than 100 beats a minute when you are sitting still. ? Fast breathing. ? Low blood pressure.  Other changes, such as: ? Sunken eyes. ? Cold hands and feet. ? Confusion. ? Lack of energy (lethargy). ? Trouble waking up from sleep. ? Short-term weight loss. ? Unconsciousness. Follow these instructions at home:  If told by your doctor, drink an ORS: ? Make an ORS by using instructions on the package. ? Start by drinking small amounts, about  cup (120 mL) every 5-10 minutes. ? Slowly drink more until you have had the amount that your doctor said to have.  Drink enough clear fluid to keep your pee clear or pale yellow. If you were told to drink an ORS, finish the ORS  first, then start slowly drinking clear fluids. Drink fluids such as: ? Water. Do not drink only water by itself. Doing that can make the salt (sodium) level in your body get too low (hyponatremia). ? Ice chips. ? Fruit juice that you have added water to (diluted). ? Low-calorie sports drinks.  Avoid: ? Alcohol. ? Drinks that have a lot of sugar. These include high-calorie sports drinks, fruit juice that does not have water added, and soda. ? Caffeine. ? Foods that are greasy or have a lot of fat or sugar.  Take over-the-counter and prescription medicines only as told by your doctor.  Do not take salt tablets. Doing that can make the salt level in your body get too high (hypernatremia).  Eat foods that have minerals (electrolytes). Examples include bananas, oranges, potatoes, tomatoes, and spinach.  Keep all follow-up visits as told by your doctor. This is important. Contact a doctor if:  You have belly (abdominal) pain that: ? Gets worse. ? Stays in one area (localizes).  You have a rash.  You have a stiff neck.  You get angry or annoyed more easily than normal (irritability).  You are more sleepy than normal.  You have a harder time waking up than normal.  You feel: ? Weak. ? Dizzy. ? Very thirsty.  You have peed (urinated) only a small amount of very dark pee during 6-8 hours. Get help right away if:  You have symptoms of   very bad dehydration.  You cannot drink fluids without throwing up (vomiting).  Your symptoms get worse with treatment.  You have a fever.  You have a very bad headache.  You are throwing up or having watery poop (diarrhea) and it: ? Gets worse. ? Does not go away.  You have blood or something green (bile) in your throw-up.  You have blood in your poop (stool). This may cause poop to look black and tarry.  You have not peed in 6-8 hours.  You pass out (faint).  Your heart rate when you are sitting still is more than 100 beats a  minute.  You have trouble breathing. This information is not intended to replace advice given to you by your health care provider. Make sure you discuss any questions you have with your health care provider. Document Released: 06/25/2009 Document Revised: 03/18/2016 Document Reviewed: 10/23/2015 Elsevier Interactive Patient Education  2018 [employer]  

## 2018-03-20 ENCOUNTER — Telehealth: Payer: Self-pay | Admitting: Cardiology

## 2018-03-20 NOTE — Telephone Encounter (Signed)
Pt's wife [name] called and would like a nurse to give her a call back concerning the pt's BP

## 2018-03-21 ENCOUNTER — Inpatient Hospital Stay: Payer: Medicare Other

## 2018-03-21 ENCOUNTER — Other Ambulatory Visit: Payer: Self-pay | Admitting: *Deleted

## 2018-03-21 ENCOUNTER — Telehealth: Payer: Self-pay | Admitting: *Deleted

## 2018-03-21 ENCOUNTER — Inpatient Hospital Stay (HOSPITAL_BASED_OUTPATIENT_CLINIC_OR_DEPARTMENT_OTHER): Payer: Medicare Other | Admitting: Oncology

## 2018-03-21 ENCOUNTER — Telehealth: Payer: Self-pay | Admitting: Oncology

## 2018-03-21 VITALS — BP 122/81 | HR 85 | Temp 97.6°F | Resp 18 | Ht 69.0 in | Wt 141.4 lb

## 2018-03-21 DIAGNOSIS — Z95828 Presence of other vascular implants and grafts: Secondary | ICD-10-CM

## 2018-03-21 DIAGNOSIS — I951 Orthostatic hypotension: Secondary | ICD-10-CM | POA: Diagnosis not present

## 2018-03-21 DIAGNOSIS — R627 Adult failure to thrive: Secondary | ICD-10-CM

## 2018-03-21 DIAGNOSIS — E860 Dehydration: Secondary | ICD-10-CM | POA: Diagnosis not present

## 2018-03-21 DIAGNOSIS — I252 Old myocardial infarction: Secondary | ICD-10-CM | POA: Diagnosis not present

## 2018-03-21 DIAGNOSIS — C642 Malignant neoplasm of left kidney, except renal pelvis: Secondary | ICD-10-CM | POA: Diagnosis not present

## 2018-03-21 DIAGNOSIS — I2510 Atherosclerotic heart disease of native coronary artery without angina pectoris: Secondary | ICD-10-CM | POA: Diagnosis not present

## 2018-03-21 DIAGNOSIS — E119 Type 2 diabetes mellitus without complications: Secondary | ICD-10-CM

## 2018-03-21 DIAGNOSIS — C250 Malignant neoplasm of head of pancreas: Secondary | ICD-10-CM | POA: Diagnosis not present

## 2018-03-21 DIAGNOSIS — I10 Essential (primary) hypertension: Secondary | ICD-10-CM

## 2018-03-21 DIAGNOSIS — I350 Nonrheumatic aortic (valve) stenosis: Secondary | ICD-10-CM | POA: Diagnosis not present

## 2018-03-21 LAB — CMP (CANCER CENTER ONLY)
ALT: 19 U/L (ref 0–44)
AST: 18 U/L (ref 15–41)
Albumin: 4.2 g/dL (ref 3.5–5.0)
Alkaline Phosphatase: 109 U/L (ref 38–126)
Anion gap: 12 (ref 5–15)
BUN: 20 mg/dL (ref 8–23)
CO2: 21 mmol/L — ABNORMAL LOW (ref 22–32)
Calcium: 9.9 mg/dL (ref 8.9–10.3)
Chloride: 94 mmol/L — ABNORMAL LOW (ref 98–111)
Creatinine: 1.66 mg/dL — ABNORMAL HIGH (ref 0.61–1.24)
GFR, Est AFR Am: 45 mL/min — ABNORMAL LOW (ref >60)
GFR, Estimated: 39 mL/min — ABNORMAL LOW (ref >60)
Glucose, Bld: 143 mg/dL — ABNORMAL HIGH (ref 70–99)
Potassium: 4.1 mmol/L (ref 3.5–5.1)
Sodium: 127 mmol/L — ABNORMAL LOW (ref 135–145)
Total Bilirubin: 0.6 mg/dL (ref 0.3–1.2)
Total Protein: 6.9 g/dL (ref 6.5–8.1)

## 2018-03-21 LAB — CBC WITH DIFFERENTIAL (CANCER CENTER ONLY)
Basophils Absolute: 0 10*3/uL (ref 0.0–0.1)
Basophils Relative: 0 %
Eosinophils Absolute: 0.2 10*3/uL (ref 0.0–0.5)
Eosinophils Relative: 2 %
HCT: 33.9 % — ABNORMAL LOW (ref 38.4–49.9)
Hemoglobin: 11.3 g/dL — ABNORMAL LOW (ref 13.0–17.1)
Lymphocytes Relative: 17 %
Lymphs Abs: 1.6 10*3/uL (ref 0.9–3.3)
MCH: 30.1 pg (ref 27.2–33.4)
MCHC: 33.3 g/dL (ref 32.0–36.0)
MCV: 90.2 fL (ref 79.3–98.0)
Monocytes Absolute: 0.9 10*3/uL (ref 0.1–0.9)
Monocytes Relative: 9 %
Neutro Abs: 6.9 10*3/uL — ABNORMAL HIGH (ref 1.5–6.5)
Neutrophils Relative %: 72 %
Platelet Count: 145 10*3/uL (ref 140–400)
RBC: 3.76 MIL/uL — ABNORMAL LOW (ref 4.20–5.82)
RDW: 14.8 % — ABNORMAL HIGH (ref 11.0–14.6)
WBC Count: 9.6 10*3/uL (ref 4.0–10.3)

## 2018-03-21 MED ORDER — SODIUM CHLORIDE 0.9 % IV SOLN
Freq: Once | INTRAVENOUS | Status: DC
  Administered 2018-03-21: 09:00:00 via INTRAVENOUS

## 2018-03-21 MED ORDER — SODIUM CHLORIDE 0.9% FLUSH
10.0000 mL | Freq: Once | INTRAVENOUS | Status: AC
  Administered 2018-03-21: 10 mL
  Filled 2018-03-21: qty 10

## 2018-03-21 NOTE — Telephone Encounter (Signed)
Per Dr. Benay Spice patient needs stat BMP and another liter of fluids tomorrow. Orders entered for lab and scheduled fluids with [facility] for tomorrow ok per desk RN.

## 2018-03-21 NOTE — Patient Instructions (Signed)
Dehydration, Adult Dehydration is when there is not enough fluid or water in your body. This happens when you lose more fluids than you take in. Dehydration can range from mild to very bad. It should be treated right away to keep it from getting very bad. Symptoms of mild dehydration may include:  Thirst.  Dry lips.  Slightly dry mouth.  Dry, warm skin.  Dizziness. Symptoms of moderate dehydration may include:  Very dry mouth.  Muscle cramps.  Dark pee (urine). Pee may be the color of tea.  Your body making less pee.  Your eyes making fewer tears.  Heartbeat that is uneven or faster than normal (palpitations).  Headache.  Light-headedness, especially when you stand up from sitting.  Fainting (syncope). Symptoms of very bad dehydration may include:  Changes in skin, such as: ? Cold and clammy skin. ? Blotchy (mottled) or pale skin. ? Skin that does not quickly return to normal after being lightly pinched and let go (poor skin turgor).  Changes in body fluids, such as: ? Feeling very thirsty. ? Your eyes making fewer tears. ? Not sweating when body temperature is high, such as in hot weather. ? Your body making very little pee.  Changes in vital signs, such as: ? Weak pulse. ? Pulse that is more than 100 beats a minute when you are sitting still. ? Fast breathing. ? Low blood pressure.  Other changes, such as: ? Sunken eyes. ? Cold hands and feet. ? Confusion. ? Lack of energy (lethargy). ? Trouble waking up from sleep. ? Short-term weight loss. ? Unconsciousness. Follow these instructions at home:  If told by your doctor, drink an ORS: ? Make an ORS by using instructions on the package. ? Start by drinking small amounts, about  cup (120 mL) every 5-10 minutes. ? Slowly drink more until you have had the amount that your doctor said to have.  Drink enough clear fluid to keep your pee clear or pale yellow. If you were told to drink an ORS, finish the ORS  first, then start slowly drinking clear fluids. Drink fluids such as: ? Water. Do not drink only water by itself. Doing that can make the salt (sodium) level in your body get too low (hyponatremia). ? Ice chips. ? Fruit juice that you have added water to (diluted). ? Low-calorie sports drinks.  Avoid: ? Alcohol. ? Drinks that have a lot of sugar. These include high-calorie sports drinks, fruit juice that does not have water added, and soda. ? Caffeine. ? Foods that are greasy or have a lot of fat or sugar.  Take over-the-counter and prescription medicines only as told by your doctor.  Do not take salt tablets. Doing that can make the salt level in your body get too high (hypernatremia).  Eat foods that have minerals (electrolytes). Examples include bananas, oranges, potatoes, tomatoes, and spinach.  Keep all follow-up visits as told by your doctor. This is important. Contact a doctor if:  You have belly (abdominal) pain that: ? Gets worse. ? Stays in one area (localizes).  You have a rash.  You have a stiff neck.  You get angry or annoyed more easily than normal (irritability).  You are more sleepy than normal.  You have a harder time waking up than normal.  You feel: ? Weak. ? Dizzy. ? Very thirsty.  You have peed (urinated) only a small amount of very dark pee during 6-8 hours. Get help right away if:  You have symptoms of   very bad dehydration.  You cannot drink fluids without throwing up (vomiting).  Your symptoms get worse with treatment.  You have a fever.  You have a very bad headache.  You are throwing up or having watery poop (diarrhea) and it: ? Gets worse. ? Does not go away.  You have blood or something green (bile) in your throw-up.  You have blood in your poop (stool). This may cause poop to look black and tarry.  You have not peed in 6-8 hours.  You pass out (faint).  Your heart rate when you are sitting still is more than 100 beats a  minute.  You have trouble breathing. This information is not intended to replace advice given to you by your health care provider. Make sure you discuss any questions you have with your health care provider. Document Released: 06/25/2009 Document Revised: 03/18/2016 Document Reviewed: 10/23/2015 Elsevier Interactive Patient Education  2018 [employer]  

## 2018-03-21 NOTE — Telephone Encounter (Signed)
Returned pt call. No answer. Left message for pt to return my call.

## 2018-03-21 NOTE — Telephone Encounter (Signed)
Scheduled appt per 7/10 los - gave patient AVS and calender per los.

## 2018-03-21 NOTE — Progress Notes (Signed)
[facility] OFFICE PROGRESS NOTE   Diagnosis: Pancreas cancer  INTERVAL HISTORY:   Manuel Olson returns for a scheduled visit.  He was referred to the symptom management clinic by Dr. Barry Dienes on 03/19/2018 secondary to anorexia and failure to thrive.  He received intravenous fluids.  He reports feeling better.  He was prescribed Megace by Dr. Barry Dienes, but has not started this.  He reports abdominal pain after eating last night.  No fever.  No dyspnea.  No diarrhea.  Objective:  Vital signs in last 24 hours:  Blood pressure 122/81, pulse 85, temperature 97.6 F (36.4 C), temperature source Oral, resp. rate 18, height 5' 9"  (1.753 m), weight 141 lb 6.4 oz (64.1 kg), SpO2 100 %.    HEENT: No thrush Resp: Lungs clear bilaterally Cardio: Regular rate and rhythm GI: No hepatomegaly, healed incision, nontender Vascular: No leg edema   Portacath/PICC-without erythema  Lab Results:  Lab Results  Component Value Date   WBC 8.5 03/19/2018   HGB 12.5 (L) 03/19/2018   HCT 37.8 (L) 03/19/2018   MCV 91.2 03/19/2018   PLT 162 03/19/2018   NEUTROABS 6.0 03/19/2018    CMP  Lab Results  Component Value Date   NA 129 (L) 03/19/2018   K 4.5 03/19/2018   CL 95 (L) 03/19/2018   CO2 26 03/19/2018   GLUCOSE 102 (H) 03/19/2018   BUN 23 03/19/2018   CREATININE 1.84 (H) 03/19/2018   CALCIUM 10.4 (H) 03/19/2018   PROT 7.4 03/19/2018   ALBUMIN 4.4 03/19/2018   AST 22 03/19/2018   ALT 24 03/19/2018   ALKPHOS 109 03/19/2018   BILITOT 0.5 03/19/2018   GFRNONAA 34 (L) 03/19/2018   GFRAA 40 (L) 03/19/2018    No results found for: CEA1  Lab Results  Component Value Date   INR 1.03 02/09/2018    Imaging:  No results found.  Medications: I have reviewed the patient's current medications.   Assessment/Plan: 1. Adenocarcinoma of the pancreas head/neck, status post an EUS biopsy 08/02/2017 confirming adenocarcinoma ? EUS 08/02/2017-pancreas head/neck mass, abutment of  the portal vein, no peripancreatic adenopathy ? MRI abdomen 07/27/2017-head/neck pancreas mass with mass-effect on the portal splenic confluence, no involvement of the celiac axis or superior mesenteric artery, no evidence of metastatic disease ? PET scan 08/31/2017-very hypermetabolism at the pancreatic head mass, no evidence of metastatic disease. The isolated right lower lobe nodule below PET resolution, left sided renal mass with decreased activity relative to the normal adjacent kidney ? Cycle 1 FOLFIRINOX 09/16/2017 ? Cycle 2 FOLFOX 10/16/2017 ? Cycle 3 FOLFOX 10/30/2017 ? Cycle 4 FOLFIRINOX 11/14/2017 (irinotecan resumed with a dose reduction) ? Cycle 5 FOLFIRINOX3/20/2019 (Oxaliplatin dose reduced due to thrombocytopenia) ? Cycle 6 FOLFIRINOX4/11/2017 ? CTs 12/25/2017-stable right lower lobe nodule, decrease in size of the pancreas head mass, stable left kidney mass ? Pancreaticoduodenectomy and left nephrectomy 02/19/2018,[id number], 2/18 lymph nodes positive, lymphovascular and perineural invasion present, mild to moderate treatment effect, tumor involved adherent portal vein with negative portal vein margins  2. 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 Barry Dienes  9. G of nausea, diarrhea, and abdominal pain-chemotherapy enteritis versus bowel obstruction versus  ischemic enteritis  10. 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  He was dehydrated when he was  here on 03/19/2018.  I encouraged him to increase his fluid intake and eat frequent small meals.  He was scheduled to begin adjuvant FOLFIRINOX today.  His performance status is not adequate to begin chemotherapy at present.  He will begin a trial of Megace.  He will receive intravenous fluids today.  We will repeat a chemistry panel today.  He will return for an office visit on 03/26/2018.  The plan is to initiate FOLFIRINOX chemotherapy if his performance status improves over the next few weeks.  Manuel Coder, MD  03/21/2018  8:29 AM

## 2018-03-22 ENCOUNTER — Inpatient Hospital Stay: Payer: Medicare Other

## 2018-03-22 ENCOUNTER — Telehealth: Payer: Self-pay

## 2018-03-22 ENCOUNTER — Other Ambulatory Visit: Payer: Self-pay | Admitting: Medical

## 2018-03-22 VITALS — BP 142/72 | HR 75 | Temp 97.7°F | Resp 16

## 2018-03-22 DIAGNOSIS — R42 Dizziness and giddiness: Secondary | ICD-10-CM

## 2018-03-22 DIAGNOSIS — C250 Malignant neoplasm of head of pancreas: Secondary | ICD-10-CM | POA: Diagnosis not present

## 2018-03-22 DIAGNOSIS — R627 Adult failure to thrive: Secondary | ICD-10-CM | POA: Diagnosis not present

## 2018-03-22 DIAGNOSIS — E860 Dehydration: Secondary | ICD-10-CM | POA: Diagnosis not present

## 2018-03-22 DIAGNOSIS — I951 Orthostatic hypotension: Secondary | ICD-10-CM | POA: Diagnosis not present

## 2018-03-22 DIAGNOSIS — C642 Malignant neoplasm of left kidney, except renal pelvis: Secondary | ICD-10-CM | POA: Diagnosis not present

## 2018-03-22 DIAGNOSIS — I10 Essential (primary) hypertension: Secondary | ICD-10-CM | POA: Diagnosis not present

## 2018-03-22 LAB — BASIC METABOLIC PANEL - CANCER CENTER ONLY
Anion gap: 10 (ref 5–15)
BUN: 19 mg/dL (ref 8–23)
CO2: 23 mmol/L (ref 22–32)
Calcium: 10 mg/dL (ref 8.9–10.3)
Chloride: 95 mmol/L — ABNORMAL LOW (ref 98–111)
Creatinine: 1.72 mg/dL — ABNORMAL HIGH (ref 0.61–1.24)
GFR, Est AFR Am: 43 mL/min — ABNORMAL LOW (ref >60)
GFR, Estimated: 37 mL/min — ABNORMAL LOW (ref >60)
Glucose, Bld: 178 mg/dL — ABNORMAL HIGH (ref 70–99)
Potassium: 4.5 mmol/L (ref 3.5–5.1)
Sodium: 128 mmol/L — ABNORMAL LOW (ref 135–145)

## 2018-03-22 LAB — CANCER ANTIGEN 19-9: CA 19-9: 9 U/mL (ref 0–35)

## 2018-03-22 MED ORDER — HEPARIN SOD (PORK) LOCK FLUSH 100 UNIT/ML IV SOLN
500.0000 [IU] | Freq: Once | INTRAVENOUS | Status: AC | PRN
  Administered 2018-03-22: 500 [IU]
  Filled 2018-03-22: qty 5

## 2018-03-22 MED ORDER — SODIUM CHLORIDE 0.9 % IV SOLN
Freq: Once | INTRAVENOUS | Status: AC
  Administered 2018-03-22: 13:00:00 via INTRAVENOUS

## 2018-03-22 MED ORDER — MECLIZINE HCL 12.5 MG PO TABS: 12.5000 mg | ORAL_TABLET | Freq: Three times a day (TID) | ORAL | 0 refills | Status: DC | PRN

## 2018-03-22 MED ORDER — SODIUM CHLORIDE 0.9% FLUSH
10.0000 mL | INTRAVENOUS | Status: DC | PRN
  Administered 2018-03-22: 10 mL
  Filled 2018-03-22: qty 10

## 2018-03-22 NOTE — Patient Instructions (Signed)
Dehydration, Adult Dehydration is when there is not enough fluid or water in your body. This happens when you lose more fluids than you take in. Dehydration can range from mild to very bad. It should be treated right away to keep it from getting very bad. Symptoms of mild dehydration may include:  Thirst.  Dry lips.  Slightly dry mouth.  Dry, warm skin.  Dizziness. Symptoms of moderate dehydration may include:  Very dry mouth.  Muscle cramps.  Dark pee (urine). Pee may be the color of tea.  Your body making less pee.  Your eyes making fewer tears.  Heartbeat that is uneven or faster than normal (palpitations).  Headache.  Light-headedness, especially when you stand up from sitting.  Fainting (syncope). Symptoms of very bad dehydration may include:  Changes in skin, such as: ? Cold and clammy skin. ? Blotchy (mottled) or pale skin. ? Skin that does not quickly return to normal after being lightly pinched and let go (poor skin turgor).  Changes in body fluids, such as: ? Feeling very thirsty. ? Your eyes making fewer tears. ? Not sweating when body temperature is high, such as in hot weather. ? Your body making very little pee.  Changes in vital signs, such as: ? Weak pulse. ? Pulse that is more than 100 beats a minute when you are sitting still. ? Fast breathing. ? Low blood pressure.  Other changes, such as: ? Sunken eyes. ? Cold hands and feet. ? Confusion. ? Lack of energy (lethargy). ? Trouble waking up from sleep. ? Short-term weight loss. ? Unconsciousness. Follow these instructions at home:  If told by your doctor, drink an ORS: ? Make an ORS by using instructions on the package. ? Start by drinking small amounts, about  cup (120 mL) every 5-10 minutes. ? Slowly drink more until you have had the amount that your doctor said to have.  Drink enough clear fluid to keep your pee clear or pale yellow. If you were told to drink an ORS, finish the ORS  first, then start slowly drinking clear fluids. Drink fluids such as: ? Water. Do not drink only water by itself. Doing that can make the salt (sodium) level in your body get too low (hyponatremia). ? Ice chips. ? Fruit juice that you have added water to (diluted). ? Low-calorie sports drinks.  Avoid: ? Alcohol. ? Drinks that have a lot of sugar. These include high-calorie sports drinks, fruit juice that does not have water added, and soda. ? Caffeine. ? Foods that are greasy or have a lot of fat or sugar.  Take over-the-counter and prescription medicines only as told by your doctor.  Do not take salt tablets. Doing that can make the salt level in your body get too high (hypernatremia).  Eat foods that have minerals (electrolytes). Examples include bananas, oranges, potatoes, tomatoes, and spinach.  Keep all follow-up visits as told by your doctor. This is important. Contact a doctor if:  You have belly (abdominal) pain that: ? Gets worse. ? Stays in one area (localizes).  You have a rash.  You have a stiff neck.  You get angry or annoyed more easily than normal (irritability).  You are more sleepy than normal.  You have a harder time waking up than normal.  You feel: ? Weak. ? Dizzy. ? Very thirsty.  You have peed (urinated) only a small amount of very dark pee during 6-8 hours. Get help right away if:  You have symptoms of   very bad dehydration.  You cannot drink fluids without throwing up (vomiting).  Your symptoms get worse with treatment.  You have a fever.  You have a very bad headache.  You are throwing up or having watery poop (diarrhea) and it: ? Gets worse. ? Does not go away.  You have blood or something green (bile) in your throw-up.  You have blood in your poop (stool). This may cause poop to look black and tarry.  You have not peed in 6-8 hours.  You pass out (faint).  Your heart rate when you are sitting still is more than 100 beats a  minute.  You have trouble breathing. This information is not intended to replace advice given to you by your health care provider. Make sure you discuss any questions you have with your health care provider. Document Released: 06/25/2009 Document Revised: 03/18/2016 Document Reviewed: 10/23/2015 Elsevier Interactive Patient Education  2018 [employer]  

## 2018-03-22 NOTE — Telephone Encounter (Addendum)
Spoke with pt wife. Per Dr. Benay Spice, pt lab values still indicate signs of dehydration. MD requesting pt to come in tomorrow for additional labs and fluids. Pt and wife voiced understanding. Pt also instructed to hold lisinopril until next week's appt. High priority message sent to schedulers and AD. Pt to receive call tomorrow with time if available.

## 2018-03-23 ENCOUNTER — Telehealth: Payer: Self-pay | Admitting: Oncology

## 2018-03-23 ENCOUNTER — Inpatient Hospital Stay: Payer: Medicare Other

## 2018-03-23 ENCOUNTER — Other Ambulatory Visit: Payer: Self-pay

## 2018-03-23 ENCOUNTER — Other Ambulatory Visit: Payer: Self-pay | Admitting: *Deleted

## 2018-03-23 DIAGNOSIS — E860 Dehydration: Secondary | ICD-10-CM

## 2018-03-23 NOTE — Telephone Encounter (Signed)
Spoke w/ pt's wife re scheduling pt for fluids - she wanted to schedule on Saturday instead of today. She had stated that the pt has started eating again and wanted the nure to know.

## 2018-03-23 NOTE — Telephone Encounter (Signed)
Returned call, no answer. Left message for pt's wife to return call.

## 2018-03-24 ENCOUNTER — Inpatient Hospital Stay: Payer: Medicare Other

## 2018-03-24 ENCOUNTER — Telehealth: Payer: Self-pay | Admitting: *Deleted

## 2018-03-24 NOTE — Telephone Encounter (Signed)
This RN called pt's home number per no show by 11 am for a 10 am IVF appointment.  Per discussion with Mrs Morais - pt is currently sleeping -she states pt is eating more since Megace was started - " but he says he is too tired to come in to the office and just doesn't want to "  Mrs Tabb stated her concern over pt's choice not to come in " but what can I do " note she states husband is strong willed and stubborn " so I have a hard time getting him to do things "  This RN validated her concerns - and discussed issues of possible dehydration - including decreased urination and skin turgor. Mrs Routh states husband is drinking fair and " urinating a lot - which I am surprised because he only has one kidney ".  Mrs Tews states she is feeding husband small meals with good protein which he has eaten over the past 24 hours.  This RN reviewed possible home interventions including use of coconut water - which may be mixed with diet ginger ale for hydration benefit. Also supported her concerns regarding husband's current behavior and  reluctance to come in to the office - Mrs Koudelka has begun to give pt 1/2 ativan twice a day- which Mrs Tietje feels is helping husband's frustration with  current status. Reviewed appointment on Monday for actual visit with provider and need to keep this appointment so further plans can be discussed and scheduled.  Per end of phone conversation - Mrs Roher understands to continue current care and she will institute coconut water for hydration concern. She understands to call if condition declines otherwise plan is to follow up in the office as scheduled on 03/26/2018 - no further needs at this time.

## 2018-03-26 ENCOUNTER — Inpatient Hospital Stay: Payer: Medicare Other | Admitting: Nurse Practitioner

## 2018-03-26 ENCOUNTER — Inpatient Hospital Stay: Payer: Medicare Other

## 2018-03-27 ENCOUNTER — Telehealth: Payer: Self-pay | Admitting: Oncology

## 2018-03-27 NOTE — Telephone Encounter (Signed)
Rescheduled appt per 7/16 sch msg - spoke w/ pt's wife re appt.

## 2018-03-27 NOTE — Telephone Encounter (Signed)
Returned call re cancelling and rescheduling 7/15 appointments. Wife not feeling well and cannot bring patient. Gave wife new appointment for 7/19.

## 2018-03-28 ENCOUNTER — Telehealth: Payer: Self-pay | Admitting: Medical

## 2018-03-28 ENCOUNTER — Inpatient Hospital Stay (HOSPITAL_BASED_OUTPATIENT_CLINIC_OR_DEPARTMENT_OTHER): Payer: Medicare Other | Admitting: Medical

## 2018-03-28 ENCOUNTER — Inpatient Hospital Stay: Payer: Medicare Other

## 2018-03-28 ENCOUNTER — Encounter: Payer: Self-pay | Admitting: Medical

## 2018-03-28 VITALS — BP 122/72 | HR 79 | Resp 16 | Ht 69.0 in | Wt 138.6 lb

## 2018-03-28 DIAGNOSIS — C250 Malignant neoplasm of head of pancreas: Secondary | ICD-10-CM

## 2018-03-28 DIAGNOSIS — I951 Orthostatic hypotension: Secondary | ICD-10-CM | POA: Diagnosis not present

## 2018-03-28 DIAGNOSIS — I10 Essential (primary) hypertension: Secondary | ICD-10-CM | POA: Diagnosis not present

## 2018-03-28 DIAGNOSIS — C642 Malignant neoplasm of left kidney, except renal pelvis: Secondary | ICD-10-CM | POA: Diagnosis not present

## 2018-03-28 DIAGNOSIS — Z95828 Presence of other vascular implants and grafts: Secondary | ICD-10-CM

## 2018-03-28 DIAGNOSIS — E860 Dehydration: Secondary | ICD-10-CM | POA: Diagnosis not present

## 2018-03-28 DIAGNOSIS — R627 Adult failure to thrive: Secondary | ICD-10-CM | POA: Diagnosis not present

## 2018-03-28 LAB — CMP (CANCER CENTER ONLY)
ALT: 15 U/L (ref 0–44)
AST: 13 U/L — ABNORMAL LOW (ref 15–41)
Albumin: 4.1 g/dL (ref 3.5–5.0)
Alkaline Phosphatase: 107 U/L (ref 38–126)
Anion gap: 7 (ref 5–15)
BUN: 17 mg/dL (ref 8–23)
CO2: 21 mmol/L — ABNORMAL LOW (ref 22–32)
Calcium: 10.2 mg/dL (ref 8.9–10.3)
Chloride: 95 mmol/L — ABNORMAL LOW (ref 98–111)
Creatinine: 1.69 mg/dL — ABNORMAL HIGH (ref 0.61–1.24)
GFR, Est AFR Am: 44 mL/min — ABNORMAL LOW (ref >60)
GFR, Estimated: 38 mL/min — ABNORMAL LOW (ref >60)
Glucose, Bld: 142 mg/dL — ABNORMAL HIGH (ref 70–99)
Potassium: 4.4 mmol/L (ref 3.5–5.1)
Sodium: 123 mmol/L — ABNORMAL LOW (ref 135–145)
Total Bilirubin: 0.5 mg/dL (ref 0.3–1.2)
Total Protein: 7 g/dL (ref 6.5–8.1)

## 2018-03-28 MED ORDER — SODIUM CHLORIDE 0.9% FLUSH
10.0000 mL | Freq: Once | INTRAVENOUS | Status: AC
  Administered 2018-03-28: 10 mL
  Filled 2018-03-28: qty 10

## 2018-03-28 MED ORDER — HEPARIN SOD (PORK) LOCK FLUSH 100 UNIT/ML IV SOLN
500.0000 [IU] | Freq: Once | INTRAVENOUS | Status: AC
  Administered 2018-03-28: 500 [IU]
  Filled 2018-03-28: qty 5

## 2018-03-28 MED ORDER — SODIUM CHLORIDE 0.9 % IV SOLN
Freq: Once | INTRAVENOUS | Status: AC
  Administered 2018-03-28: 11:00:00 via INTRAVENOUS

## 2018-03-28 NOTE — Patient Instructions (Signed)
Dehydration, Adult Dehydration is when there is not enough fluid or water in your body. This happens when you lose more fluids than you take in. Dehydration can range from mild to very bad. It should be treated right away to keep it from getting very bad. Symptoms of mild dehydration may include:  Thirst.  Dry lips.  Slightly dry mouth.  Dry, warm skin.  Dizziness. Symptoms of moderate dehydration may include:  Very dry mouth.  Muscle cramps.  Dark pee (urine). Pee may be the color of tea.  Your body making less pee.  Your eyes making fewer tears.  Heartbeat that is uneven or faster than normal (palpitations).  Headache.  Light-headedness, especially when you stand up from sitting.  Fainting (syncope). Symptoms of very bad dehydration may include:  Changes in skin, such as: ? Cold and clammy skin. ? Blotchy (mottled) or pale skin. ? Skin that does not quickly return to normal after being lightly pinched and let go (poor skin turgor).  Changes in body fluids, such as: ? Feeling very thirsty. ? Your eyes making fewer tears. ? Not sweating when body temperature is high, such as in hot weather. ? Your body making very little pee.  Changes in vital signs, such as: ? Weak pulse. ? Pulse that is more than 100 beats a minute when you are sitting still. ? Fast breathing. ? Low blood pressure.  Other changes, such as: ? Sunken eyes. ? Cold hands and feet. ? Confusion. ? Lack of energy (lethargy). ? Trouble waking up from sleep. ? Short-term weight loss. ? Unconsciousness. Follow these instructions at home:  If told by your doctor, drink an ORS: ? Make an ORS by using instructions on the package. ? Start by drinking small amounts, about  cup (120 mL) every 5-10 minutes. ? Slowly drink more until you have had the amount that your doctor said to have.  Drink enough clear fluid to keep your pee clear or pale yellow. If you were told to drink an ORS, finish the ORS  first, then start slowly drinking clear fluids. Drink fluids such as: ? Water. Do not drink only water by itself. Doing that can make the salt (sodium) level in your body get too low (hyponatremia). ? Ice chips. ? Fruit juice that you have added water to (diluted). ? Low-calorie sports drinks.  Avoid: ? Alcohol. ? Drinks that have a lot of sugar. These include high-calorie sports drinks, fruit juice that does not have water added, and soda. ? Caffeine. ? Foods that are greasy or have a lot of fat or sugar.  Take over-the-counter and prescription medicines only as told by your doctor.  Do not take salt tablets. Doing that can make the salt level in your body get too high (hypernatremia).  Eat foods that have minerals (electrolytes). Examples include bananas, oranges, potatoes, tomatoes, and spinach.  Keep all follow-up visits as told by your doctor. This is important. Contact a doctor if:  You have belly (abdominal) pain that: ? Gets worse. ? Stays in one area (localizes).  You have a rash.  You have a stiff neck.  You get angry or annoyed more easily than normal (irritability).  You are more sleepy than normal.  You have a harder time waking up than normal.  You feel: ? Weak. ? Dizzy. ? Very thirsty.  You have peed (urinated) only a small amount of very dark pee during 6-8 hours. Get help right away if:  You have symptoms of   very bad dehydration.  You cannot drink fluids without throwing up (vomiting).  Your symptoms get worse with treatment.  You have a fever.  You have a very bad headache.  You are throwing up or having watery poop (diarrhea) and it: ? Gets worse. ? Does not go away.  You have blood or something green (bile) in your throw-up.  You have blood in your poop (stool). This may cause poop to look black and tarry.  You have not peed in 6-8 hours.  You pass out (faint).  Your heart rate when you are sitting still is more than 100 beats a  minute.  You have trouble breathing. This information is not intended to replace advice given to you by your health care provider. Make sure you discuss any questions you have with your health care provider. Document Released: 06/25/2009 Document Revised: 03/18/2016 Document Reviewed: 10/23/2015 Elsevier Interactive Patient Education  2018 [employer]  

## 2018-03-28 NOTE — Telephone Encounter (Signed)
Scheduled appt per 7/17 los - patient given avs and calender.

## 2018-03-29 ENCOUNTER — Inpatient Hospital Stay: Payer: Medicare Other

## 2018-03-29 VITALS — BP 135/87 | HR 72 | Temp 98.5°F | Resp 17

## 2018-03-29 DIAGNOSIS — Z95828 Presence of other vascular implants and grafts: Secondary | ICD-10-CM

## 2018-03-29 DIAGNOSIS — R627 Adult failure to thrive: Secondary | ICD-10-CM | POA: Diagnosis not present

## 2018-03-29 DIAGNOSIS — E860 Dehydration: Secondary | ICD-10-CM | POA: Diagnosis not present

## 2018-03-29 DIAGNOSIS — C250 Malignant neoplasm of head of pancreas: Secondary | ICD-10-CM | POA: Diagnosis not present

## 2018-03-29 DIAGNOSIS — C642 Malignant neoplasm of left kidney, except renal pelvis: Secondary | ICD-10-CM | POA: Diagnosis not present

## 2018-03-29 DIAGNOSIS — I10 Essential (primary) hypertension: Secondary | ICD-10-CM | POA: Diagnosis not present

## 2018-03-29 DIAGNOSIS — I951 Orthostatic hypotension: Secondary | ICD-10-CM | POA: Diagnosis not present

## 2018-03-29 MED ORDER — HEPARIN SOD (PORK) LOCK FLUSH 100 UNIT/ML IV SOLN
500.0000 [IU] | Freq: Once | INTRAVENOUS | Status: AC
  Administered 2018-03-29: 500 [IU]
  Filled 2018-03-29: qty 5

## 2018-03-29 MED ORDER — SODIUM CHLORIDE 0.9 % IV SOLN
1000.0000 mL | Freq: Once | INTRAVENOUS | Status: AC
  Administered 2018-03-29: 1000 mL via INTRAVENOUS

## 2018-03-29 MED ORDER — SODIUM CHLORIDE 0.9% FLUSH
10.0000 mL | Freq: Once | INTRAVENOUS | Status: AC
  Administered 2018-03-29: 10 mL
  Filled 2018-03-29: qty 10

## 2018-03-29 NOTE — Patient Instructions (Signed)
Dehydration, Adult Dehydration is when there is not enough fluid or water in your body. This happens when you lose more fluids than you take in. Dehydration can range from mild to very bad. It should be treated right away to keep it from getting very bad. Symptoms of mild dehydration may include:  Thirst.  Dry lips.  Slightly dry mouth.  Dry, warm skin.  Dizziness. Symptoms of moderate dehydration may include:  Very dry mouth.  Muscle cramps.  Dark pee (urine). Pee may be the color of tea.  Your body making less pee.  Your eyes making fewer tears.  Heartbeat that is uneven or faster than normal (palpitations).  Headache.  Light-headedness, especially when you stand up from sitting.  Fainting (syncope). Symptoms of very bad dehydration may include:  Changes in skin, such as: ? Cold and clammy skin. ? Blotchy (mottled) or pale skin. ? Skin that does not quickly return to normal after being lightly pinched and let go (poor skin turgor).  Changes in body fluids, such as: ? Feeling very thirsty. ? Your eyes making fewer tears. ? Not sweating when body temperature is high, such as in hot weather. ? Your body making very little pee.  Changes in vital signs, such as: ? Weak pulse. ? Pulse that is more than 100 beats a minute when you are sitting still. ? Fast breathing. ? Low blood pressure.  Other changes, such as: ? Sunken eyes. ? Cold hands and feet. ? Confusion. ? Lack of energy (lethargy). ? Trouble waking up from sleep. ? Short-term weight loss. ? Unconsciousness. Follow these instructions at home:  If told by your doctor, drink an ORS: ? Make an ORS by using instructions on the package. ? Start by drinking small amounts, about  cup (120 mL) every 5-10 minutes. ? Slowly drink more until you have had the amount that your doctor said to have.  Drink enough clear fluid to keep your pee clear or pale yellow. If you were told to drink an ORS, finish the ORS  first, then start slowly drinking clear fluids. Drink fluids such as: ? Water. Do not drink only water by itself. Doing that can make the salt (sodium) level in your body get too low (hyponatremia). ? Ice chips. ? Fruit juice that you have added water to (diluted). ? Low-calorie sports drinks.  Avoid: ? Alcohol. ? Drinks that have a lot of sugar. These include high-calorie sports drinks, fruit juice that does not have water added, and soda. ? Caffeine. ? Foods that are greasy or have a lot of fat or sugar.  Take over-the-counter and prescription medicines only as told by your doctor.  Do not take salt tablets. Doing that can make the salt level in your body get too high (hypernatremia).  Eat foods that have minerals (electrolytes). Examples include bananas, oranges, potatoes, tomatoes, and spinach.  Keep all follow-up visits as told by your doctor. This is important. Contact a doctor if:  You have belly (abdominal) pain that: ? Gets worse. ? Stays in one area (localizes).  You have a rash.  You have a stiff neck.  You get angry or annoyed more easily than normal (irritability).  You are more sleepy than normal.  You have a harder time waking up than normal.  You feel: ? Weak. ? Dizzy. ? Very thirsty.  You have peed (urinated) only a small amount of very dark pee during 6-8 hours. Get help right away if:  You have symptoms of   very bad dehydration.  You cannot drink fluids without throwing up (vomiting).  Your symptoms get worse with treatment.  You have a fever.  You have a very bad headache.  You are throwing up or having watery poop (diarrhea) and it: ? Gets worse. ? Does not go away.  You have blood or something green (bile) in your throw-up.  You have blood in your poop (stool). This may cause poop to look black and tarry.  You have not peed in 6-8 hours.  You pass out (faint).  Your heart rate when you are sitting still is more than 100 beats a  minute.  You have trouble breathing. This information is not intended to replace advice given to you by your health care provider. Make sure you discuss any questions you have with your health care provider. Document Released: 06/25/2009 Document Revised: 03/18/2016 Document Reviewed: 10/23/2015 Elsevier Interactive Patient Education  2018 [employer]  

## 2018-03-30 ENCOUNTER — Inpatient Hospital Stay: Payer: Medicare Other

## 2018-03-30 ENCOUNTER — Inpatient Hospital Stay (HOSPITAL_BASED_OUTPATIENT_CLINIC_OR_DEPARTMENT_OTHER): Payer: Medicare Other | Admitting: Medical

## 2018-03-30 ENCOUNTER — Other Ambulatory Visit: Payer: Self-pay

## 2018-03-30 ENCOUNTER — Inpatient Hospital Stay: Payer: Medicare Other | Admitting: Nurse Practitioner

## 2018-03-30 ENCOUNTER — Inpatient Hospital Stay: Payer: Medicare Other | Admitting: Emergency Medicine

## 2018-03-30 VITALS — BP 123/80 | HR 84 | Temp 98.0°F | Resp 17 | Ht 69.0 in | Wt 138.8 lb

## 2018-03-30 VITALS — BP 145/68 | HR 74 | Resp 18

## 2018-03-30 DIAGNOSIS — E860 Dehydration: Secondary | ICD-10-CM

## 2018-03-30 DIAGNOSIS — E871 Hypo-osmolality and hyponatremia: Secondary | ICD-10-CM

## 2018-03-30 DIAGNOSIS — C250 Malignant neoplasm of head of pancreas: Secondary | ICD-10-CM | POA: Diagnosis not present

## 2018-03-30 DIAGNOSIS — R58 Hemorrhage, not elsewhere classified: Secondary | ICD-10-CM | POA: Diagnosis not present

## 2018-03-30 DIAGNOSIS — E119 Type 2 diabetes mellitus without complications: Secondary | ICD-10-CM

## 2018-03-30 DIAGNOSIS — I10 Essential (primary) hypertension: Secondary | ICD-10-CM | POA: Diagnosis not present

## 2018-03-30 DIAGNOSIS — C642 Malignant neoplasm of left kidney, except renal pelvis: Secondary | ICD-10-CM | POA: Diagnosis not present

## 2018-03-30 DIAGNOSIS — R627 Adult failure to thrive: Secondary | ICD-10-CM | POA: Diagnosis not present

## 2018-03-30 DIAGNOSIS — Z905 Acquired absence of kidney: Secondary | ICD-10-CM | POA: Diagnosis not present

## 2018-03-30 DIAGNOSIS — Z95828 Presence of other vascular implants and grafts: Secondary | ICD-10-CM

## 2018-03-30 DIAGNOSIS — I951 Orthostatic hypotension: Secondary | ICD-10-CM | POA: Diagnosis not present

## 2018-03-30 LAB — CBC WITH DIFFERENTIAL (CANCER CENTER ONLY)
Basophils Absolute: 0 10*3/uL (ref 0.0–0.1)
Basophils Relative: 0 %
Eosinophils Absolute: 0.1 10*3/uL (ref 0.0–0.5)
Eosinophils Relative: 1 %
HCT: 31.6 % — ABNORMAL LOW (ref 38.4–49.9)
Hemoglobin: 10.8 g/dL — ABNORMAL LOW (ref 13.0–17.1)
Lymphocytes Relative: 17 %
Lymphs Abs: 1.8 10*3/uL (ref 0.9–3.3)
MCH: 30.1 pg (ref 27.2–33.4)
MCHC: 34.2 g/dL (ref 32.0–36.0)
MCV: 88 fL (ref 79.3–98.0)
Monocytes Absolute: 1.1 10*3/uL — ABNORMAL HIGH (ref 0.1–0.9)
Monocytes Relative: 10 %
Neutro Abs: 7.9 10*3/uL — ABNORMAL HIGH (ref 1.5–6.5)
Neutrophils Relative %: 72 %
Platelet Count: 139 10*3/uL — ABNORMAL LOW (ref 140–400)
RBC: 3.59 MIL/uL — ABNORMAL LOW (ref 4.20–5.82)
RDW: 14.9 % — ABNORMAL HIGH (ref 11.0–14.6)
WBC Count: 10.9 10*3/uL — ABNORMAL HIGH (ref 4.0–10.3)

## 2018-03-30 LAB — BASIC METABOLIC PANEL - CANCER CENTER ONLY
Anion gap: 9 (ref 5–15)
BUN: 16 mg/dL (ref 8–23)
CO2: 20 mmol/L — ABNORMAL LOW (ref 22–32)
Calcium: 10.4 mg/dL — ABNORMAL HIGH (ref 8.9–10.3)
Chloride: 100 mmol/L (ref 98–111)
Creatinine: 1.56 mg/dL — ABNORMAL HIGH (ref 0.61–1.24)
GFR, Est AFR Am: 49 mL/min — ABNORMAL LOW (ref >60)
GFR, Estimated: 42 mL/min — ABNORMAL LOW (ref >60)
Glucose, Bld: 89 mg/dL (ref 70–99)
Potassium: 4.4 mmol/L (ref 3.5–5.1)
Sodium: 129 mmol/L — ABNORMAL LOW (ref 135–145)

## 2018-03-30 MED ORDER — SODIUM CHLORIDE 0.9% FLUSH
10.0000 mL | Freq: Once | INTRAVENOUS | Status: AC
  Administered 2018-03-30: 10 mL
  Filled 2018-03-30: qty 10

## 2018-03-30 MED ORDER — SODIUM CHLORIDE 0.9 % IV SOLN
Freq: Once | INTRAVENOUS | Status: AC
  Administered 2018-03-30: 10:00:00 via INTRAVENOUS

## 2018-03-30 MED ORDER — SODIUM CHLORIDE 0.9 % IV SOLN: Freq: Once | INTRAVENOUS | Status: DC

## 2018-03-30 MED ORDER — HEPARIN SOD (PORK) LOCK FLUSH 100 UNIT/ML IV SOLN
500.0000 [IU] | Freq: Once | INTRAVENOUS | Status: AC
  Administered 2018-03-30: 500 [IU]
  Filled 2018-03-30: qty 5

## 2018-03-30 NOTE — Patient Instructions (Signed)
Implanted Port Home Guide An implanted port is a type of central line that is placed under the skin. Central lines are used to provide IV access when treatment or nutrition needs to be given through a person's veins. Implanted ports are used for long-term IV access. An implanted port may be placed because:  You need IV medicine that would be irritating to the small veins in your hands or arms.  You need long-term IV medicines, such as antibiotics.  You need IV nutrition for a long period.  You need frequent blood draws for lab tests.  You need dialysis.  Implanted ports are usually placed in the chest area, but they can also be placed in the upper arm, the abdomen, or the leg. An implanted port has two main parts:  Reservoir. The reservoir is round and will appear as a small, raised area under your skin. The reservoir is the part where a needle is inserted to give medicines or draw blood.  Catheter. The catheter is a thin, flexible tube that extends from the reservoir. The catheter is placed into a large vein. Medicine that is inserted into the reservoir goes into the catheter and then into the vein.  How will I care for my incision site? Do not get the incision site wet. Bathe or shower as directed by your health care provider. How is my port accessed? Special steps must be taken to access the port:  Before the port is accessed, a numbing cream can be placed on the skin. This helps numb the skin over the port site.  Your health care provider uses a sterile technique to access the port. ? Your health care provider must put on a mask and sterile gloves. ? The skin over your port is cleaned carefully with an antiseptic and allowed to dry. ? The port is gently pinched between sterile gloves, and a needle is inserted into the port.  Only "non-coring" port needles should be used to access the port. Once the port is accessed, a blood return should be checked. This helps ensure that the port  is in the vein and is not clogged.  If your port needs to remain accessed for a constant infusion, a clear (transparent) bandage will be placed over the needle site. The bandage and needle will need to be changed every week, or as directed by your health care provider.  Keep the bandage covering the needle clean and dry. Do not get it wet. Follow your health care provider's instructions on how to take a shower or bath while the port is accessed.  If your port does not need to stay accessed, no bandage is needed over the port.  What is flushing? Flushing helps keep the port from getting clogged. Follow your health care provider's instructions on how and when to flush the port. Ports are usually flushed with saline solution or a medicine called heparin. The need for flushing will depend on how the port is used.  If the port is used for intermittent medicines or blood draws, the port will need to be flushed: ? After medicines have been given. ? After blood has been drawn. ? As part of routine maintenance.  If a constant infusion is running, the port may not need to be flushed.  How long will my port stay implanted? The port can stay in for as long as your health care provider thinks it is needed. When it is time for the port to come out, surgery will be   done to remove it. The procedure is similar to the one performed when the port was put in. When should I seek immediate medical care? When you have an implanted port, you should seek immediate medical care if:  You notice a bad smell coming from the incision site.  You have swelling, redness, or drainage at the incision site.  You have more swelling or pain at the port site or the surrounding area.  You have a fever that is not controlled with medicine.  This information is not intended to replace advice given to you by your health care provider. Make sure you discuss any questions you have with your health care provider. Document  Released: 08/29/2005 Document Revised: 02/04/2016 Document Reviewed: 05/06/2013 Elsevier Interactive Patient Education  2017 [employer].  

## 2018-03-30 NOTE — Progress Notes (Signed)
These results were called to [name] and were reviewed with him . His were answered. He expressed understanding.

## 2018-03-30 NOTE — Progress Notes (Signed)
Symptoms Management Clinic Progress Note   [name] [id number] 02-01-1946 72 y.o.  [name] is managed by Dr. Dominica Severin B. Sherrill  Actively treated with chemotherapy/immunotherapy: no  Assessment: Plan:    Dehydration - Plan: 0.9 %  sodium chloride infusion  Port-A-Cath in place - Plan: heparin lock flush 100 unit/mL, sodium chloride flush (NS) 0.9 % injection 10 mL, DISCONTINUED: heparin lock flush 100 unit/mL, DISCONTINUED: sodium chloride flush (NS) 0.9 % injection 10 mL  Cancer of head of pancreas (HCC) - Plan: heparin lock flush 100 unit/mL, sodium chloride flush (NS) 0.9 % injection 10 mL, DISCONTINUED: heparin lock flush 100 unit/mL, DISCONTINUED: sodium chloride flush (NS) 0.9 % injection 10 mL   Dehydration: The patient's chemistry panel returned today showing a creatinine of 1.69.  This was down from 1.72 when the patient was last seen.  He continues to drink less water.  He does not like the taste of sugar-free drinks.  He was given 1 L of normal saline IV today.  He will return on 03/30/2018 for labs and for consideration of an additional infusion of normal saline.  Cancer of the pancreatic head: The patient is status post a Whipple's procedure.  He has not yet begun chemotherapy.  The patient will be seen in follow-up by Dr. Dominica Severin B. Sherrill on 04/03/2018.  Please see After Visit Summary for patient specific instructions.  Future Appointments  Date Time Provider [place]  04/03/2018 10:45 AM [facility] 4 [facility] None  04/03/2018 11:15 AM Owens Shark, NP [facility] None  04/03/2018 12:00 PM SYMPTOM MANAGEMENT CLINIC 2 [facility] None  04/23/2018 12:00 PM Satira Sark, MD [facility] [place] [facility]  06/25/2018  1:00 PM Philemon Kingdom, MD [facility] None    No orders of the defined types were placed in this encounter.      Subjective:   Patient ID:  Manuel Olson is a 72 y.o. (DOB Aug 05, 1946) male.  Chief  Complaint:  Chief Complaint  Patient presents with  . Fatigue    HPI Manuel Olson is a 72 year old male with a history of pancreatic cancer who is status post a Whipple's procedure on 02/19/2018 and a left radical nephrectomy.    Mr. [name] was last seen in the symptom management clinic on 03/19/2018 when he was given IV fluids.  For the possible need an IV.    He presents to the office today with his wife.  He  is eating better.  He is also sleeping better.  He has had no nausea or vomiting.  He continues to drink less water and does not like the taste of diet drinks.  Despite his improved eating, he  has lost 3 pounds over the last 7 days.  Medications: I have reviewed the patient's current medications.  Allergies: No Known Allergies  Past Medical History:  Diagnosis Date  . Arthritis   . CAD (coronary artery disease)    a. 11/2016 in [place]: STEMI s/p DES to mid LAD, DES to diagonal, DES x 2 to proximal and mid RCA   . Essential hypertension   . Former smoker   . GERD (gastroesophageal reflux disease)   . Heart murmur   . Hyperlipidemia   . Myocardial infarction ([place])   . Pancreatic cancer ([place])   . Pneumonia   . Renal mass   . S/P TAVR (transcatheter aortic valve replacement)    a. 08/2017:  Edwards Sapien 3 THV (size 26 mm, model #9600CM26A , serial #  [id number])  . Severe aortic stenosis    a. 08/2017: s/p TAVR by Dr. Angelena Form and Dr. Cyndia Bent.   . 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 Type 2 diabetes mellitus ([place])     Past Surgical History:  Procedure Laterality Date  . APPENDECTOMY    . CARDIAC CATHETERIZATION    . EUS N/A 08/02/2017   Procedure: UPPER ENDOSCOPIC ULTRASOUND (EUS) RADIAL;  Surgeon: [name], MD;  [facility];  Service: Endoscopy;  Laterality: N/A;  . EYE SURGERY Left   . HAND SURGERY Bilateral   . HERNIA REPAIR Right   . LAPAROSCOPY N/A 02/19/2018   Procedure: DIAGNOSTIC LAPAROSCOPY, ERAS PATHWAY;  Surgeon: [name], MD;   Location: [place];  Service: General;  Laterality: N/A;  . NEPHRECTOMY Left 02/19/2018   Procedure: OPEN LEFT RADICAL NEPHRECTOMY;  Surgeon: Cleon Gustin, MD;  Location: [place];  Service: Urology;  Laterality: Left;  . PORTACATH PLACEMENT N/A 09/15/2017   Procedure: INSERTION PORT-A-CATH;  Surgeon: [name], MD;  Location: [place];  Service: General;  Laterality: N/A;  . RIGHT/LEFT HEART CATH AND CORONARY ANGIOGRAPHY N/A 07/05/2017   Procedure: RIGHT/LEFT HEART CATH AND CORONARY ANGIOGRAPHY;  Surgeon: [name], MD;  Location: [place] CV LAB;  Service: Cardiovascular;  Laterality: N/A;  . SHOULDER ARTHROSCOPY WITH ROTATOR CUFF REPAIR Left   . TEE WITHOUT CARDIOVERSION N/A 08/22/2017   Procedure: TRANSESOPHAGEAL ECHOCARDIOGRAM (TEE);  Surgeon: Burnell Blanks, MD;  Location: [place];  Service: Open Heart Surgery;  Laterality: N/A;  . TRANSCATHETER AORTIC VALVE REPLACEMENT, TRANSFEMORAL N/A 08/22/2017   Procedure: TRANSCATHETER AORTIC VALVE REPLACEMENT, TRANSFEMORAL;  Surgeon: Burnell Blanks, MD;  Location: [place];  Service: Open Heart Surgery;  Laterality: N/A;  . WHIPPLE PROCEDURE N/A 02/19/2018   Procedure: WHIPPLE PROCEDURE;  Surgeon: [name], MD;  Location: [facility] OR;  Service: General;  Laterality: N/A;    Family History  Problem Relation Age of Onset  . Lupus Mother   . CAD Brother   . Hypertension Brother     Social History   Socioeconomic History  . Marital status: Married    Spouse name: Not on file  . Number of children: Not on file  . Years of education: Not on file  . Highest education level: Not on file  Occupational History  . Not on file  Social Needs  . Financial resource strain: Not on file  . Food insecurity:    Worry: Not on file    Inability: Not on file  . Transportation needs:    Medical: Not on file    Non-medical: Not on file  Tobacco Use  . Smoking status: Former Smoker    Packs/day: 1.00    Years:  57.00    Pack years: 57.00    Types: Cigarettes    Start date: 09/12/1958    Last attempt to quit: 04/12/2016    Years since quitting: 1.9  . Smokeless tobacco: Never Used  Substance and Sexual Activity  . Alcohol use: No  . Drug use: No  . Sexual activity: Not on file  Lifestyle  . Physical activity:    Days per week: Not on file    Minutes per session: Not on file  . Stress: Not on file  Relationships  . Social connections:    Talks on phone: Not on file    Gets together: Not on file    Attends religious service: Not on file  Active member of club or organization: Not on file    Attends meetings of clubs or organizations: Not on file    Relationship status: Not on file  . Intimate partner violence:    Fear of current or ex partner: Not on file    Emotionally abused: Not on file    Physically abused: Not on file    Forced sexual activity: Not on file  Other Topics Concern  . Not on file  Social History Narrative  . Not on file    Past Medical History, Surgical history, Social history, and Family history were reviewed and updated as appropriate.   Please see review of systems for further details on the patient's review from today.   Review of Systems:  Review of Systems  Constitutional: Positive for appetite change (Eating has improved.) and unexpected weight change (3 pound weight loss over the last 7 days). Negative for chills, diaphoresis and fever.  HENT: Negative for trouble swallowing and voice change.   Respiratory: Negative for cough, chest tightness, shortness of breath and wheezing.   Cardiovascular: Negative for chest pain and palpitations.  Gastrointestinal: Negative for abdominal pain, constipation, diarrhea, nausea and vomiting.  Musculoskeletal: Negative for back pain and myalgias.  Neurological: Negative for dizziness, light-headedness and headaches.    Objective:   Physical Exam:  BP 122/72 (BP Location: Left Arm, Patient Position: Sitting)   Pulse  79   Resp 16   Ht 5' 9"  (1.753 m)   Wt 138 lb 9.6 oz (62.9 kg)   SpO2 99%   BMI 20.47 kg/m  ECOG: 1  Physical Exam  Constitutional: No distress.  HENT:  Head: Normocephalic and atraumatic.  Cardiovascular: Normal rate, regular rhythm and normal heart sounds. Exam reveals no gallop and no friction rub.  No murmur heard. Pulmonary/Chest: Effort normal and breath sounds normal. No respiratory distress. He has no wheezes. He has no rales.  Abdominal: Soft. Bowel sounds are normal. He exhibits no distension and no mass. There is no tenderness. There is no rebound and no guarding.    Neurological: He is alert.  Skin: Skin is warm and dry. No rash noted. He is not diaphoretic. No erythema.  Increased tenting of the dorsal surface of the hands bilaterally  Psychiatric: He has a normal mood and affect. His behavior is normal. Judgment and thought content normal.    Lab Review:     Component Value Date/Time   NA 129 (L) 03/30/2018 0844   NA 139 09/07/2017 0844   K 4.4 03/30/2018 0844   K 5.4 No visable hemolysis (H) 09/07/2017 0844   CL 100 03/30/2018 0844   CO2 20 (L) 03/30/2018 0844   CO2 27 09/07/2017 0844   GLUCOSE 89 03/30/2018 0844   GLUCOSE 227 (H) 09/07/2017 0844   BUN 16 03/30/2018 0844   BUN 29.8 (H) 09/07/2017 0844   CREATININE 1.56 (H) 03/30/2018 0844   CREATININE 1.2 09/07/2017 0844   CALCIUM 10.4 (H) 03/30/2018 0844   CALCIUM 9.8 09/07/2017 0844   PROT 7.0 03/28/2018 0851   PROT 6.9 09/07/2017 0844   ALBUMIN 4.1 03/28/2018 0851   ALBUMIN 3.9 09/07/2017 0844   AST 13 (L) 03/28/2018 0851   AST 13 09/07/2017 0844   ALT 15 03/28/2018 0851   ALT 12 09/07/2017 0844   ALKPHOS 107 03/28/2018 0851   ALKPHOS 89 09/07/2017 0844   BILITOT 0.5 03/28/2018 0851   BILITOT 0.37 09/07/2017 0844   GFRNONAA 42 (L) 03/30/2018 0844   GFRAA  49 (L) 03/30/2018 0844       Component Value Date/Time   WBC 9.6 03/21/2018 0759   WBC 10.5 02/28/2018 0705   RBC 3.76 (L) 03/21/2018  0759   HGB 11.3 (L) 03/21/2018 0759   HGB 12.7 (L) 09/07/2017 0844   HCT 33.9 (L) 03/21/2018 0759   HCT 38.7 09/07/2017 0844   PLT 145 03/21/2018 0759   PLT 161 09/07/2017 0844   MCV 90.2 03/21/2018 0759   MCV 91.8 09/07/2017 0844   MCH 30.1 03/21/2018 0759   MCHC 33.3 03/21/2018 0759   RDW 14.8 (H) 03/21/2018 0759   RDW 14.0 09/07/2017 0844   LYMPHSABS 1.6 03/21/2018 0759   LYMPHSABS 1.3 09/07/2017 0844   MONOABS 0.9 03/21/2018 0759   MONOABS 0.9 09/07/2017 0844   EOSABS 0.2 03/21/2018 0759   EOSABS 0.4 09/07/2017 0844   BASOSABS 0.0 03/21/2018 0759   BASOSABS 0.1 09/07/2017 0844   -------------------------------  Imaging from last 24 hours (if applicable):  Radiology interpretation: No results found.

## 2018-03-30 NOTE — Patient Instructions (Signed)
Dehydration, Adult Dehydration is when there is not enough fluid or water in your body. This happens when you lose more fluids than you take in. Dehydration can range from mild to very bad. It should be treated right away to keep it from getting very bad. Symptoms of mild dehydration may include:  Thirst.  Dry lips.  Slightly dry mouth.  Dry, warm skin.  Dizziness. Symptoms of moderate dehydration may include:  Very dry mouth.  Muscle cramps.  Dark pee (urine). Pee may be the color of tea.  Your body making less pee.  Your eyes making fewer tears.  Heartbeat that is uneven or faster than normal (palpitations).  Headache.  Light-headedness, especially when you stand up from sitting.  Fainting (syncope). Symptoms of very bad dehydration may include:  Changes in skin, such as: ? Cold and clammy skin. ? Blotchy (mottled) or pale skin. ? Skin that does not quickly return to normal after being lightly pinched and let go (poor skin turgor).  Changes in body fluids, such as: ? Feeling very thirsty. ? Your eyes making fewer tears. ? Not sweating when body temperature is high, such as in hot weather. ? Your body making very little pee.  Changes in vital signs, such as: ? Weak pulse. ? Pulse that is more than 100 beats a minute when you are sitting still. ? Fast breathing. ? Low blood pressure.  Other changes, such as: ? Sunken eyes. ? Cold hands and feet. ? Confusion. ? Lack of energy (lethargy). ? Trouble waking up from sleep. ? Short-term weight loss. ? Unconsciousness. Follow these instructions at home:  If told by your doctor, drink an ORS: ? Make an ORS by using instructions on the package. ? Start by drinking small amounts, about  cup (120 mL) every 5-10 minutes. ? Slowly drink more until you have had the amount that your doctor said to have.  Drink enough clear fluid to keep your pee clear or pale yellow. If you were told to drink an ORS, finish the ORS  first, then start slowly drinking clear fluids. Drink fluids such as: ? Water. Do not drink only water by itself. Doing that can make the salt (sodium) level in your body get too low (hyponatremia). ? Ice chips. ? Fruit juice that you have added water to (diluted). ? Low-calorie sports drinks.  Avoid: ? Alcohol. ? Drinks that have a lot of sugar. These include high-calorie sports drinks, fruit juice that does not have water added, and soda. ? Caffeine. ? Foods that are greasy or have a lot of fat or sugar.  Take over-the-counter and prescription medicines only as told by your doctor.  Do not take salt tablets. Doing that can make the salt level in your body get too high (hypernatremia).  Eat foods that have minerals (electrolytes). Examples include bananas, oranges, potatoes, tomatoes, and spinach.  Keep all follow-up visits as told by your doctor. This is important. Contact a doctor if:  You have belly (abdominal) pain that: ? Gets worse. ? Stays in one area (localizes).  You have a rash.  You have a stiff neck.  You get angry or annoyed more easily than normal (irritability).  You are more sleepy than normal.  You have a harder time waking up than normal.  You feel: ? Weak. ? Dizzy. ? Very thirsty.  You have peed (urinated) only a small amount of very dark pee during 6-8 hours. Get help right away if:  You have symptoms of   very bad dehydration.  You cannot drink fluids without throwing up (vomiting).  Your symptoms get worse with treatment.  You have a fever.  You have a very bad headache.  You are throwing up or having watery poop (diarrhea) and it: ? Gets worse. ? Does not go away.  You have blood or something green (bile) in your throw-up.  You have blood in your poop (stool). This may cause poop to look black and tarry.  You have not peed in 6-8 hours.  You pass out (faint).  Your heart rate when you are sitting still is more than 100 beats a  minute.  You have trouble breathing. This information is not intended to replace advice given to you by your health care provider. Make sure you discuss any questions you have with your health care provider. Document Released: 06/25/2009 Document Revised: 03/18/2016 Document Reviewed: 10/23/2015 Elsevier Interactive Patient Education  2018 [employer]  

## 2018-04-02 ENCOUNTER — Other Ambulatory Visit: Payer: Self-pay | Admitting: Nurse Practitioner

## 2018-04-02 DIAGNOSIS — C250 Malignant neoplasm of head of pancreas: Secondary | ICD-10-CM

## 2018-04-02 NOTE — Progress Notes (Signed)
Symptoms Management Clinic Progress Note   Manuel Olson [id number] March 09, 1946 72 y.o.   Manuel Olson is managed by Dr. Dominica Severin B. Sherrill  Actively treated with chemotherapy/immunotherapy: no    Assessment: Plan:    Dehydration - Plan: CBC with Differential ([place] Only)  Bleeding  Port-A-Cath in place - Plan: heparin lock flush 100 unit/mL, sodium chloride flush (NS) 0.9 % injection 10 mL  Cancer of head of pancreas (HCC) - Plan: heparin lock flush 100 unit/mL, sodium chloride flush (NS) 0.9 % injection 10 mL  Hyponatremia  Dehydration: The patient's chemistry panel returned today showing a creatinine of 1.569.  This was down from 1.68 when the patient was last seen.  He continues to drink less water.    He was given samples of Crystal light drink powdered today.  He had one while he was here and reported that he enjoyed the taste. He was given 1 L of normal saline IV today.  He was offered IV fluids tomorrow and again on Monday.  He declines for now stating that he will try to drink more fluids. He will return on 04/03/2018 to see Dr. Dominica Severin B. Sherrill.  He will have labs completed on that visit.     Cancer of the pancreatic head: The patient is status post a Whipple's procedure.  He has not yet begun chemotherapy.  The patient will be seen in follow-up by Dr. Dominica Severin B. Sherrill on 04/03/2018.  Hyponatremia: A chemistry panel returned today with a sodium at 129.  This was up from 123 at the time of his last visit.  Please see After Visit Summary for patient specific instructions.  Future Appointments  Date Time Provider [place]  04/03/2018 10:45 AM [facility] 4 [facility] None  04/03/2018 11:15 AM Owens Shark, NP [facility] None  04/03/2018 12:00 PM SYMPTOM MANAGEMENT CLINIC 2 [facility] None  04/23/2018 12:00 PM Satira Sark, MD [facility] [place] [facility]  06/25/2018  1:00 PM Philemon Kingdom, MD [facility] None    Orders Placed  This Encounter  Procedures  . CBC with Differential ([facility] Center Only)       Subjective:   Patient ID:  Manuel Olson is a 72 y.o. (DOB [birth year]) male.  Chief Complaint:  Chief Complaint  Patient presents with  . Follow-up    HPI Manuel Olson is a 72 year old male with a history of pancreatic cancer who is status post a Whipple's procedure on 02/19/2018  in addition to a left radical nephrectomy.   Manuel Olson was last seen in the symptom management clinic on 03/28/2018 when he was given IV fluids.    He presents to the office today for additional IV fluids.  His energy level is better.  He continues to eat better.  He is having at least 2 meals per day.  His shortness of breath and dyspnea on exertion have improved.  He denies any nausea, vomiting, or diarrhea.  He is afebrile.  He was offered IV fluids for tomorrow and Monday which he declines.  He would like to attempt to increase his fluid intake on his own.  Medications: I have reviewed the patient's current medications.  Allergies: No Known Allergies  Past Medical History:  Diagnosis Date  . Arthritis   . CAD (coronary artery disease)    a. 11/2016 in [place]: STEMI s/p DES to mid LAD, DES to diagonal, DES x 2 to proximal and mid RCA   . Essential hypertension   .  Former smoker   . GERD (gastroesophageal reflux disease)   . Heart murmur   . Hyperlipidemia   . Myocardial infarction ([place])   . Pancreatic cancer ([place])   . Pneumonia   . Renal mass   . S/P TAVR (transcatheter aortic valve replacement)    a. 08/2017:  Edwards Sapien 3 THV (size 26 mm, model #9600CM26A , serial # [id number])  . Severe aortic stenosis    a. 08/2017: s/p TAVR by Dr. Angelena Form and Dr. Cyndia Bent.   . 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 Type 2 diabetes mellitus ([place])     Past Surgical History:  Procedure Laterality Date  . APPENDECTOMY    . CARDIAC CATHETERIZATION    . EUS N/A 08/02/2017   Procedure: UPPER ENDOSCOPIC ULTRASOUND  (EUS) RADIAL;  Surgeon: [name], MD;  [facility];  Service: Endoscopy;  Laterality: N/A;  . EYE SURGERY Left   . HAND SURGERY Bilateral   . HERNIA REPAIR Right   . LAPAROSCOPY N/A 02/19/2018   Procedure: DIAGNOSTIC LAPAROSCOPY, ERAS PATHWAY;  Surgeon: [name], MD;  Location: [place];  Service: General;  Laterality: N/A;  . NEPHRECTOMY Left 02/19/2018   Procedure: OPEN LEFT RADICAL NEPHRECTOMY;  Surgeon: Cleon Gustin, MD;  Location: [place];  Service: Urology;  Laterality: Left;  . PORTACATH PLACEMENT N/A 09/15/2017   Procedure: INSERTION PORT-A-CATH;  Surgeon: [name], MD;  Location: [place];  Service: General;  Laterality: N/A;  . RIGHT/LEFT HEART CATH AND CORONARY ANGIOGRAPHY N/A 07/05/2017   Procedure: RIGHT/LEFT HEART CATH AND CORONARY ANGIOGRAPHY;  Surgeon: [name], MD;  Location: [place] CV LAB;  Service: Cardiovascular;  Laterality: N/A;  . SHOULDER ARTHROSCOPY WITH ROTATOR CUFF REPAIR Left   . TEE WITHOUT CARDIOVERSION N/A 08/22/2017   Procedure: TRANSESOPHAGEAL ECHOCARDIOGRAM (TEE);  Surgeon: Burnell Blanks, MD;  Location: [place];  Service: Open Heart Surgery;  Laterality: N/A;  . TRANSCATHETER AORTIC VALVE REPLACEMENT, TRANSFEMORAL N/A 08/22/2017   Procedure: TRANSCATHETER AORTIC VALVE REPLACEMENT, TRANSFEMORAL;  Surgeon: Burnell Blanks, MD;  Location: [place];  Service: Open Heart Surgery;  Laterality: N/A;  . WHIPPLE PROCEDURE N/A 02/19/2018   Procedure: WHIPPLE PROCEDURE;  Surgeon: [name], MD;  Location: [facility] OR;  Service: General;  Laterality: N/A;    Family History  Problem Relation Age of Onset  . Lupus Mother   . CAD Brother   . Hypertension Brother     Social History   Socioeconomic History  . Marital status: Married    Spouse name: Not on file  . Number of children: Not on file  . Years of education: Not on file  . Highest education level: Not on file  Occupational History  . Not  on file  Social Needs  . Financial resource strain: Not on file  . Food insecurity:    Worry: Not on file    Inability: Not on file  . Transportation needs:    Medical: Not on file    Non-medical: Not on file  Tobacco Use  . Smoking status: Former Smoker    Packs/day: 1.00    Years: 57.00    Pack years: 57.00    Types: Cigarettes    Start date: 09/12/1958    Last attempt to quit: 04/12/2016    Years since quitting: 1.9  . Smokeless tobacco: Never Used  Substance and Sexual Activity  . Alcohol use: No  . Drug use: No  . Sexual activity: Not  on file  Lifestyle  . Physical activity:    Days per week: Not on file    Minutes per session: Not on file  . Stress: Not on file  Relationships  . Social connections:    Talks on phone: Not on file    Gets together: Not on file    Attends religious service: Not on file    Active member of club or organization: Not on file    Attends meetings of clubs or organizations: Not on file    Relationship status: Not on file  . Intimate partner violence:    Fear of current or ex partner: Not on file    Emotionally abused: Not on file    Physically abused: Not on file    Forced sexual activity: Not on file  Other Topics Concern  . Not on file  Social History Narrative  . Not on file    Past Medical History, Surgical history, Social history, and Family history were reviewed and updated as appropriate.   Please see review of systems for further details on the patient's review from today.   Review of Systems:  Review of Systems  Constitutional: Positive for appetite change (The patient's appetite continues to improve.  He is eating at least 2 full meals a day.). Negative for chills, diaphoresis and fever.  HENT: Negative for trouble swallowing and voice change.   Respiratory: Negative for cough, chest tightness, shortness of breath and wheezing.   Cardiovascular: Negative for chest pain and palpitations.  Gastrointestinal: Negative for  abdominal pain, constipation, diarrhea, nausea and vomiting.  Musculoskeletal: Negative for back pain and myalgias.  Neurological: Negative for dizziness, light-headedness and headaches.    Objective:   Physical Exam:  BP 123/80 (BP Location: Right Arm, Patient Position: Sitting)   Pulse 84   Temp 98 F (36.7 C) (Oral)   Resp 17   Ht 5' 9"  (1.753 m)   Wt 138 lb 12.8 oz (63 kg)   SpO2 100%   BMI 20.50 kg/m  ECOG: 0  Physical Exam  Constitutional: No distress.  HENT:  Head: Normocephalic and atraumatic.  Cardiovascular: Normal rate, regular rhythm and normal heart sounds. Exam reveals no gallop and no friction rub.  No murmur heard. Pulmonary/Chest: Effort normal and breath sounds normal. No respiratory distress. He has no wheezes. He has no rales.  Abdominal: Soft. Bowel sounds are normal. He exhibits no distension and no mass. There is no tenderness. There is no rebound and no guarding.    Neurological: He is alert.  Skin: Skin is warm and dry. No rash noted. He is not diaphoretic. No erythema.    Lab Review:     Component Value Date/Time   NA 129 (L) 03/30/2018 0844   NA 139 09/07/2017 0844   K 4.4 03/30/2018 0844   K 5.4 No visable hemolysis (H) 09/07/2017 0844   CL 100 03/30/2018 0844   CO2 20 (L) 03/30/2018 0844   CO2 27 09/07/2017 0844   GLUCOSE 89 03/30/2018 0844   GLUCOSE 227 (H) 09/07/2017 0844   BUN 16 03/30/2018 0844   BUN 29.8 (H) 09/07/2017 0844   CREATININE 1.56 (H) 03/30/2018 0844   CREATININE 1.2 09/07/2017 0844   CALCIUM 10.4 (H) 03/30/2018 0844   CALCIUM 9.8 09/07/2017 0844   PROT 7.0 03/28/2018 0851   PROT 6.9 09/07/2017 0844   ALBUMIN 4.1 03/28/2018 0851   ALBUMIN 3.9 09/07/2017 0844   AST 13 (L) 03/28/2018 0851   AST 13 09/07/2017  0844   ALT 15 03/28/2018 0851   ALT 12 09/07/2017 0844   ALKPHOS 107 03/28/2018 0851   ALKPHOS 89 09/07/2017 0844   BILITOT 0.5 03/28/2018 0851   BILITOT 0.37 09/07/2017 0844   GFRNONAA 42 (L) 03/30/2018  0844   GFRAA 49 (L) 03/30/2018 0844       Component Value Date/Time   WBC 10.9 (H) 03/30/2018 1238   WBC 10.5 02/28/2018 0705   RBC 3.59 (L) 03/30/2018 1238   HGB 10.8 (L) 03/30/2018 1238   HGB 12.7 (L) 09/07/2017 0844   HCT 31.6 (L) 03/30/2018 1238   HCT 38.7 09/07/2017 0844   PLT 139 (L) 03/30/2018 1238   PLT 161 09/07/2017 0844   MCV 88.0 03/30/2018 1238   MCV 91.8 09/07/2017 0844   MCH 30.1 03/30/2018 1238   MCHC 34.2 03/30/2018 1238   RDW 14.9 (H) 03/30/2018 1238   RDW 14.0 09/07/2017 0844   LYMPHSABS 1.8 03/30/2018 1238   LYMPHSABS 1.3 09/07/2017 0844   MONOABS 1.1 (H) 03/30/2018 1238   MONOABS 0.9 09/07/2017 0844   EOSABS 0.1 03/30/2018 1238   EOSABS 0.4 09/07/2017 0844   BASOSABS 0.0 03/30/2018 1238   BASOSABS 0.1 09/07/2017 0844   -------------------------------  Imaging from last 24 hours (if applicable):  Radiology interpretation: No results found.

## 2018-04-03 ENCOUNTER — Encounter: Payer: Self-pay | Admitting: Nurse Practitioner

## 2018-04-03 ENCOUNTER — Inpatient Hospital Stay (HOSPITAL_BASED_OUTPATIENT_CLINIC_OR_DEPARTMENT_OTHER): Payer: Medicare Other | Admitting: Nurse Practitioner

## 2018-04-03 ENCOUNTER — Inpatient Hospital Stay (HOSPITAL_COMMUNITY)
Admission: AD | Admit: 2018-04-03 | Discharge: 2018-04-05 | DRG: 640 | Disposition: A | Discharge status: Home / Self Care | Payer: Medicare Other | Source: Ambulatory Visit | Attending: Family Medicine | Admitting: Family Medicine

## 2018-04-03 ENCOUNTER — Inpatient Hospital Stay: Payer: Medicare Other

## 2018-04-03 ENCOUNTER — Other Ambulatory Visit: Payer: Self-pay

## 2018-04-03 ENCOUNTER — Encounter (HOSPITAL_COMMUNITY): Payer: Self-pay

## 2018-04-03 VITALS — BP 78/58 | HR 86 | Temp 98.1°F | Resp 18 | Ht 69.0 in | Wt 136.1 lb

## 2018-04-03 DIAGNOSIS — R5381 Other malaise: Secondary | ICD-10-CM

## 2018-04-03 DIAGNOSIS — E785 Hyperlipidemia, unspecified: Secondary | ICD-10-CM | POA: Diagnosis present

## 2018-04-03 DIAGNOSIS — Z681 Body mass index (BMI) 19 or less, adult: Secondary | ICD-10-CM

## 2018-04-03 DIAGNOSIS — E43 Unspecified severe protein-calorie malnutrition: Secondary | ICD-10-CM

## 2018-04-03 DIAGNOSIS — Z905 Acquired absence of kidney: Secondary | ICD-10-CM

## 2018-04-03 DIAGNOSIS — Z87891 Personal history of nicotine dependence: Secondary | ICD-10-CM

## 2018-04-03 DIAGNOSIS — R627 Adult failure to thrive: Secondary | ICD-10-CM

## 2018-04-03 DIAGNOSIS — E871 Hypo-osmolality and hyponatremia: Secondary | ICD-10-CM | POA: Diagnosis not present

## 2018-04-03 DIAGNOSIS — I10 Essential (primary) hypertension: Secondary | ICD-10-CM | POA: Diagnosis not present

## 2018-04-03 DIAGNOSIS — Z794 Long term (current) use of insulin: Secondary | ICD-10-CM

## 2018-04-03 DIAGNOSIS — Z79899 Other long term (current) drug therapy: Secondary | ICD-10-CM

## 2018-04-03 DIAGNOSIS — C642 Malignant neoplasm of left kidney, except renal pelvis: Secondary | ICD-10-CM

## 2018-04-03 DIAGNOSIS — I129 Hypertensive chronic kidney disease with stage 1 through stage 4 chronic kidney disease, or unspecified chronic kidney disease: Secondary | ICD-10-CM | POA: Diagnosis present

## 2018-04-03 DIAGNOSIS — Z90411 Acquired partial absence of pancreas: Secondary | ICD-10-CM

## 2018-04-03 DIAGNOSIS — E860 Dehydration: Secondary | ICD-10-CM

## 2018-04-03 DIAGNOSIS — Z95828 Presence of other vascular implants and grafts: Secondary | ICD-10-CM

## 2018-04-03 DIAGNOSIS — C250 Malignant neoplasm of head of pancreas: Secondary | ICD-10-CM

## 2018-04-03 DIAGNOSIS — I951 Orthostatic hypotension: Secondary | ICD-10-CM | POA: Diagnosis present

## 2018-04-03 DIAGNOSIS — Z8673 Personal history of transient ischemic attack (TIA), and cerebral infarction without residual deficits: Secondary | ICD-10-CM

## 2018-04-03 DIAGNOSIS — Z7902 Long term (current) use of antithrombotics/antiplatelets: Secondary | ICD-10-CM

## 2018-04-03 DIAGNOSIS — I2510 Atherosclerotic heart disease of native coronary artery without angina pectoris: Secondary | ICD-10-CM | POA: Diagnosis present

## 2018-04-03 DIAGNOSIS — I252 Old myocardial infarction: Secondary | ICD-10-CM

## 2018-04-03 DIAGNOSIS — N183 Chronic kidney disease, stage 3 (moderate): Secondary | ICD-10-CM | POA: Diagnosis present

## 2018-04-03 DIAGNOSIS — Z955 Presence of coronary angioplasty implant and graft: Secondary | ICD-10-CM

## 2018-04-03 DIAGNOSIS — E119 Type 2 diabetes mellitus without complications: Secondary | ICD-10-CM | POA: Diagnosis not present

## 2018-04-03 DIAGNOSIS — N2889 Other specified disorders of kidney and ureter: Secondary | ICD-10-CM | POA: Diagnosis present

## 2018-04-03 DIAGNOSIS — I350 Nonrheumatic aortic (valve) stenosis: Secondary | ICD-10-CM | POA: Diagnosis not present

## 2018-04-03 DIAGNOSIS — I255 Ischemic cardiomyopathy: Secondary | ICD-10-CM

## 2018-04-03 DIAGNOSIS — E1122 Type 2 diabetes mellitus with diabetic chronic kidney disease: Secondary | ICD-10-CM | POA: Diagnosis present

## 2018-04-03 DIAGNOSIS — K219 Gastro-esophageal reflux disease without esophagitis: Secondary | ICD-10-CM | POA: Diagnosis present

## 2018-04-03 DIAGNOSIS — I959 Hypotension, unspecified: Secondary | ICD-10-CM | POA: Diagnosis not present

## 2018-04-03 DIAGNOSIS — Z952 Presence of prosthetic heart valve: Secondary | ICD-10-CM

## 2018-04-03 LAB — CMP (CANCER CENTER ONLY)
ALT: 13 U/L (ref 0–44)
AST: 13 U/L — ABNORMAL LOW (ref 15–41)
Albumin: 3.9 g/dL (ref 3.5–5.0)
Alkaline Phosphatase: 107 U/L (ref 38–126)
Anion gap: 8 (ref 5–15)
BUN: 20 mg/dL (ref 8–23)
CO2: 20 mmol/L — ABNORMAL LOW (ref 22–32)
Calcium: 10.2 mg/dL (ref 8.9–10.3)
Chloride: 96 mmol/L — ABNORMAL LOW (ref 98–111)
Creatinine: 1.71 mg/dL — ABNORMAL HIGH (ref 0.61–1.24)
GFR, Est AFR Am: 44 mL/min — ABNORMAL LOW (ref >60)
GFR, Estimated: 38 mL/min — ABNORMAL LOW (ref >60)
Glucose, Bld: 145 mg/dL — ABNORMAL HIGH (ref 70–99)
Potassium: 4.1 mmol/L (ref 3.5–5.1)
Sodium: 124 mmol/L — ABNORMAL LOW (ref 135–145)
Total Bilirubin: 0.5 mg/dL (ref 0.3–1.2)
Total Protein: 7 g/dL (ref 6.5–8.1)

## 2018-04-03 LAB — COMPREHENSIVE METABOLIC PANEL
ALT: 15 U/L (ref 0–44)
AST: 15 U/L (ref 15–41)
Albumin: 3.8 g/dL (ref 3.5–5.0)
Alkaline Phosphatase: 89 U/L (ref 38–126)
Anion gap: 9 (ref 5–15)
BUN: 23 mg/dL (ref 8–23)
CO2: 20 mmol/L — ABNORMAL LOW (ref 22–32)
Calcium: 9.7 mg/dL (ref 8.9–10.3)
Chloride: 98 mmol/L (ref 98–111)
Creatinine, Ser: 1.57 mg/dL — ABNORMAL HIGH (ref 0.61–1.24)
GFR calc Af Amer: 48 mL/min — ABNORMAL LOW (ref >60)
GFR calc non Af Amer: 42 mL/min — ABNORMAL LOW (ref >60)
Glucose, Bld: 146 mg/dL — ABNORMAL HIGH (ref 70–99)
Potassium: 4.4 mmol/L (ref 3.5–5.1)
Sodium: 127 mmol/L — ABNORMAL LOW (ref 135–145)
Total Bilirubin: 0.7 mg/dL (ref 0.3–1.2)
Total Protein: 6.9 g/dL (ref 6.5–8.1)

## 2018-04-03 LAB — URINALYSIS, COMPLETE (UACMP) WITH MICROSCOPIC
Bacteria, UA: NONE SEEN
Bilirubin Urine: NEGATIVE
Glucose, UA: NEGATIVE mg/dL
Hgb urine dipstick: NEGATIVE
Ketones, ur: 5 mg/dL — AB
Leukocytes, UA: NEGATIVE
Nitrite: NEGATIVE
Protein, ur: NEGATIVE mg/dL
Specific Gravity, Urine: 1.012 (ref 1.005–1.030)
pH: 5 (ref 5.0–8.0)

## 2018-04-03 LAB — CBC WITH DIFFERENTIAL/PLATELET
Basophils Absolute: 0 10*3/uL (ref 0.0–0.1)
Basophils Relative: 0 %
Eosinophils Absolute: 0.1 10*3/uL (ref 0.0–0.7)
Eosinophils Relative: 1 %
HCT: 33.5 % — ABNORMAL LOW (ref 39.0–52.0)
Hemoglobin: 11.6 g/dL — ABNORMAL LOW (ref 13.0–17.0)
Lymphocytes Relative: 14 %
Lymphs Abs: 1.3 10*3/uL (ref 0.7–4.0)
MCH: 30.4 pg (ref 26.0–34.0)
MCHC: 34.6 g/dL (ref 30.0–36.0)
MCV: 87.9 fL (ref 78.0–100.0)
Monocytes Absolute: 0.9 10*3/uL (ref 0.1–1.0)
Monocytes Relative: 10 %
Neutro Abs: 6.8 10*3/uL (ref 1.7–7.7)
Neutrophils Relative %: 75 %
Platelets: 183 10*3/uL (ref 150–400)
RBC: 3.81 MIL/uL — ABNORMAL LOW (ref 4.22–5.81)
RDW: 14.6 % (ref 11.5–15.5)
WBC: 9 10*3/uL (ref 4.0–10.5)

## 2018-04-03 LAB — PHOSPHORUS: Phosphorus: 3.7 mg/dL (ref 2.5–4.6)

## 2018-04-03 LAB — MAGNESIUM: Magnesium: 1.4 mg/dL — ABNORMAL LOW (ref 1.7–2.4)

## 2018-04-03 MED ORDER — SODIUM CHLORIDE 0.9% FLUSH
10.0000 mL | Freq: Once | INTRAVENOUS | Status: AC
  Administered 2018-04-03: 10 mL
  Filled 2018-04-03: qty 10

## 2018-04-03 MED ORDER — CLOPIDOGREL BISULFATE 75 MG PO TABS
75.0000 mg | ORAL_TABLET | Freq: Every day | ORAL | Status: DC
  Administered 2018-04-04 – 2018-04-05 (×2): 75 mg via ORAL
  Filled 2018-04-03 (×2): qty 1

## 2018-04-03 MED ORDER — SODIUM CHLORIDE 0.9 % IV BOLUS: 1000.0000 mL | Freq: Once | INTRAVENOUS | Status: DC

## 2018-04-03 MED ORDER — PANTOPRAZOLE SODIUM 40 MG PO TBEC
40.0000 mg | DELAYED_RELEASE_TABLET | Freq: Every day | ORAL | Status: DC
  Administered 2018-04-04 – 2018-04-05 (×2): 40 mg via ORAL
  Filled 2018-04-03 (×2): qty 1

## 2018-04-03 MED ORDER — SODIUM CHLORIDE 0.9 % IV SOLN
INTRAVENOUS | Status: DC
Start: 2018-04-03 — End: 2018-04-05
  Administered 2018-04-03 – 2018-04-05 (×4): via INTRAVENOUS

## 2018-04-03 MED ORDER — ONDANSETRON HCL 4 MG/2ML IJ SOLN: 4.0000 mg | Freq: Four times a day (QID) | INTRAMUSCULAR | Status: DC | PRN

## 2018-04-03 MED ORDER — ONDANSETRON HCL 4 MG PO TABS: 4.0000 mg | ORAL_TABLET | Freq: Four times a day (QID) | ORAL | Status: DC | PRN

## 2018-04-03 MED ORDER — CARVEDILOL 6.25 MG PO TABS
6.2500 mg | ORAL_TABLET | Freq: Two times a day (BID) | ORAL | Status: DC
  Administered 2018-04-03 – 2018-04-05 (×4): 6.25 mg via ORAL
  Filled 2018-04-03 (×4): qty 1

## 2018-04-03 MED ORDER — SODIUM CHLORIDE 0.9 % IV SOLN: INTRAVENOUS | Status: AC

## 2018-04-03 MED ORDER — ENSURE ENLIVE PO LIQD
237.0000 mL | Freq: Two times a day (BID) | ORAL | Status: DC
  Administered 2018-04-04: 237 mL via ORAL

## 2018-04-03 MED ORDER — INSULIN GLARGINE 100 UNIT/ML ~~LOC~~ SOLN
14.0000 [IU] | Freq: Every day | SUBCUTANEOUS | Status: DC
  Administered 2018-04-03: 14 [IU] via SUBCUTANEOUS
  Filled 2018-04-03 (×2): qty 0.14

## 2018-04-03 MED ORDER — SODIUM CHLORIDE 0.9 % IV SOLN
Freq: Once | INTRAVENOUS | Status: AC
  Administered 2018-04-03: 13:00:00 via INTRAVENOUS

## 2018-04-03 MED ORDER — HEPARIN SODIUM (PORCINE) 5000 UNIT/ML IJ SOLN
5000.0000 [IU] | Freq: Three times a day (TID) | INTRAMUSCULAR | Status: DC
  Administered 2018-04-03 – 2018-04-05 (×5): 5000 [IU] via SUBCUTANEOUS
  Filled 2018-04-03 (×5): qty 1

## 2018-04-03 MED ORDER — TRAMADOL HCL 50 MG PO TABS: 50.0000 mg | ORAL_TABLET | Freq: Four times a day (QID) | ORAL | Status: DC | PRN

## 2018-04-03 MED ORDER — MEGESTROL ACETATE 40 MG/ML PO SUSP: 10.0000 mg | Freq: Every day | ORAL | Status: DC

## 2018-04-03 MED ORDER — ACETAMINOPHEN 325 MG PO TABS
650.0000 mg | ORAL_TABLET | Freq: Four times a day (QID) | ORAL | Status: DC | PRN
Start: 2018-04-03 — End: 2018-04-05

## 2018-04-03 MED ORDER — MEGESTROL ACETATE 20 MG PO TABS
10.0000 mg | ORAL_TABLET | Freq: Every day | ORAL | Status: DC
  Administered 2018-04-03 – 2018-04-04 (×2): 10 mg via ORAL
  Filled 2018-04-03 (×2): qty 0.5

## 2018-04-03 MED ORDER — BASAGLAR KWIKPEN 100 UNIT/ML ~~LOC~~ SOPN: 14.0000 [IU] | PEN_INJECTOR | Freq: Every day | SUBCUTANEOUS | Status: DC

## 2018-04-03 MED ORDER — BACLOFEN 10 MG PO TABS: 10.0000 mg | ORAL_TABLET | Freq: Three times a day (TID) | ORAL | Status: DC | PRN

## 2018-04-03 NOTE — H&P (Signed)
History and Physical    Manuel Olson TGG:[id number] DOB: 1945-11-05 DOA: 04/03/2018  PCP: Patient, No Pcp Per   Patient coming from: Home via Oncology office  Chief Complaint: Dizziness; Dehydration  HPI: Manuel Olson is a 72 y.o. male with medical history significant of CAD with STEMI status post DES to the mid LAD and DES to the diagonal as well as DES to proximal to mid RCA, essential hypertension, GERD, prior tobacco abuse, hyperlipidemia, pancreatic cancer status post Whipple procedure, history of left renal mass status post left nephrectomy, severe aortic stenosis status post TAVR, history of CVA, and type 2 diabetes mellitus with other comorbidities who presents to [facility] as a direct admission from oncology office.  Patient states that he has been dizzy every morning and states that he especially gets dizzy when he steps out of bed initially and that some days are better than others.  He had a point where he was not eating well but states that his appetite is improved but states he does not drink appropriately and has never been to rehydrate himself well.  He states he is gotten better but wife states that he is recently been sleeping most of the day and trying to hydrate as best as he can orally.  He presented to his oncology visit today where he is noted to be severely dehydrated and orthostatic and was directed to be admitted directly for IV fluid hydration.  Patient states that he first started becoming dehydrated after his bowel procedure and that his stools are never formed properly but are mushy.  Patient denies any chest pain, shortness breath, denies any lower extremity swelling.  Denies any burning or discomfort in urine.  States that he feels tired most the time but has improved after IV fluid hydration.  And sleeps most of the day.  Patient was given fluid boluses at the oncology clinic and states he feels better now.  Wife was present at bedside and states that he  looks a lot better now as well.  Per my discussion with the patient he states that his wife cooks him big meal for breakfast and big meal for dinner but does not properly eat lunch and usually just lacks.  Also states that he tries to rehydrate orally as best as he can.  TR[facility] was called by Dr. Benay Spice to admit this patient for failure to thrive along with dehydration and orthostatic hypotension.  ED Course: No ED Course as patient was a direct admission   Review of Systems: As per HPI otherwise 10 point review of systems negative.   Past Medical History:  Diagnosis Date  . Arthritis   . CAD (coronary artery disease)    a. 11/2016 in [place]: STEMI s/p DES to mid LAD, DES to diagonal, DES x 2 to proximal and mid RCA   . Essential hypertension   . Former smoker   . GERD (gastroesophageal reflux disease)   . Heart murmur   . Hyperlipidemia   . Myocardial infarction ([place])   . Pancreatic cancer ([place])   . Pneumonia   . Renal mass   . S/P TAVR (transcatheter aortic valve replacement)    a. 08/2017:  Edwards Sapien 3 THV (size 26 mm, model #9600CM26A , serial # [id number])  . Severe aortic stenosis    a. 08/2017: s/p TAVR by Dr. Angelena Form and Dr. Cyndia Bent.   . Stroke [facility])    "stroke in left eye"  . Type 2 diabetes mellitus ([place])  Past Surgical History:  Procedure Laterality Date  . APPENDECTOMY    . CARDIAC CATHETERIZATION    . EUS N/A 08/02/2017   Procedure: UPPER ENDOSCOPIC ULTRASOUND (EUS) RADIAL;  Surgeon: [name], MD;  Location: [facility];  Service: Endoscopy;  Laterality: N/A;  . EYE SURGERY Left   . HAND SURGERY Bilateral   . HERNIA REPAIR Right   . LAPAROSCOPY N/A 02/19/2018   Procedure: DIAGNOSTIC LAPAROSCOPY, ERAS PATHWAY;  Surgeon: [name], MD;  Location: [place];  Service: General;  Laterality: N/A;  . NEPHRECTOMY Left 02/19/2018   Procedure: OPEN LEFT RADICAL NEPHRECTOMY;  Surgeon: Cleon Gustin, MD;  Location: [place];  Service: Urology;  Laterality: Left;  .  PORTACATH PLACEMENT N/A 09/15/2017   Procedure: INSERTION PORT-A-CATH;  Surgeon: [name], MD;  Location: [place];  Service: General;  Laterality: N/A;  . RIGHT/LEFT HEART CATH AND CORONARY ANGIOGRAPHY N/A 07/05/2017   Procedure: RIGHT/LEFT HEART CATH AND CORONARY ANGIOGRAPHY;  Surgeon: [name], MD;  Location: [place] CV [facility];  Service: Cardiovascular;  Laterality: N/A;  . SHOULDER ARTHROSCOPY WITH ROTATOR CUFF REPAIR Left   . TEE WITHOUT CARDIOVERSION N/A 08/22/2017   Procedure: TRANSESOPHAGEAL ECHOCARDIOGRAM (TEE);  Surgeon: Burnell Blanks, MD;  Location: [place];  Service: Open Heart Surgery;  Laterality: N/A;  . TRANSCATHETER AORTIC VALVE REPLACEMENT, TRANSFEMORAL N/A 08/22/2017   Procedure: TRANSCATHETER AORTIC VALVE REPLACEMENT, TRANSFEMORAL;  Surgeon: Burnell Blanks, MD;  Location: [place];  Service: Open Heart Surgery;  Laterality: N/A;  . WHIPPLE PROCEDURE N/A 02/19/2018   Procedure: WHIPPLE PROCEDURE;  Surgeon: [name], MD;  Location: [place];  Service: General;  Laterality: N/A;   SOCIAL   reports that he quit smoking about 1 years ago. His smoking use included cigarettes. He started smoking about 59 years ago. He has a 57.00 pack-year smoking history. He has never used smokeless tobacco. He reports that he does not drink alcohol or use drugs.  ALLERGIES No Known Allergies  Family History  Problem Relation Age of Onset  . Lupus Mother   . CAD Brother   . Hypertension Brother    Prior to Admission medications   Medication Sig Start Date End Date Taking? Authorizing Provider  acetaminophen (TYLENOL) 325 MG tablet Take 2 tablets (650 mg total) by mouth every 6 (six) hours as needed for mild pain, fever or headache. 02/26/18   [name], MD  atorvastatin (LIPITOR) 20 MG tablet Take 1 tablet (20 mg total) by mouth daily. Patient not taking: Reported on 03/30/2018 03/02/18   Angiulli, Lavon Paganini, PA-C  atorvastatin (LIPITOR) 20 MG  tablet TAKE 1 TABLET BY MOUTH ONCE DAILY. Patient not taking: Reported on 03/30/2018 03/12/18   Satira Sark, MD  baclofen (LIORESAL) 10 MG tablet Take 1 tablet (10 mg total) by mouth 3 (three) times daily as needed for muscle spasms. 03/02/18   Angiulli, Lavon Paganini, PA-C  carvedilol (COREG) 6.25 MG tablet TAKE (1) TABLET BY MOUTH TWICE A DAY WITH MEALS (BREAKFAST AND SUPPER) 03/16/18   Satira Sark, MD  clopidogrel (PLAVIX) 75 MG tablet Take 1 tablet (75 mg total) by mouth daily. Patient taking differently: Take 75 mg by mouth daily with breakfast.  10/20/17   Satira Sark, MD  gabapentin (NEURONTIN) 100 MG capsule Take 2 capsules (200 mg total) by mouth 2 (two) times daily. Patient not taking: Reported on 03/30/2018 03/02/18   Angiulli, Lavon Paganini, PA-C  Insulin Glargine [facility]) 100 UNIT/ML SOPN Inject  0.14 mLs (14 Units total) into the skin at bedtime. 03/09/18   Philemon Kingdom, MD  insulin lispro (HUMALOG KWIKPEN) 100 UNIT/ML KiwkPen Inject 0.04-0.06 mLs (4-6 Units total) into the skin 3 (three) times daily. 03/09/18   Philemon Kingdom, MD  lisinopril (PRINIVIL,ZESTRIL) 10 MG tablet Take 1 tablet (10 mg total) by mouth daily. 03/02/18   Angiulli, Lavon Paganini, PA-C  meclizine (ANTIVERT) 12.5 MG tablet Take 1 tablet (12.5 mg total) by mouth 3 (three) times daily as needed for dizziness. Patient not taking: Reported on 03/30/2018 03/22/18   Harle Stanford., PA-C  megestrol (MEGACE) 40 MG/ML suspension Take 10 mg by mouth daily. 1 tsp BID    [provider]  pantoprazole (PROTONIX) 40 MG tablet Take 1 tablet (40 mg total) by mouth daily at 12 noon. 03/02/18   Cathlyn Parsons, PA-C   Physical Exam: Vitals:   04/03/18 1626  BP: (!) 160/94  Pulse: 86  Resp: 18  Temp: 97.8 F (36.6 C)  TempSrc: Oral  Weight: 61.7 kg (136 lb 1.4 oz)  Height: 5' 9"  (1.753 m)   Constitutional: Thin Cacuasian male in NAD and appears calm and comfortable Eyes: Lids and conjunctivae normal,  sclerae anicteric  ENMT: External Ears, Nose appear normal. Grossly normal hearing. Mucous membranes are moist Neck: Appears normal, supple, no cervical masses, normal ROM, no appreciable thyromegaly, no JVD Respiratory: Diminished to auscultation bilaterally, no wheezing, rales, rhonchi or crackles. Normal respiratory effort and patient is not tachypenic. No accessory muscle use.  Cardiovascular: RRR, Has a Systolic Murmur. S1 and S2 auscultated. No extremity edema.  Abdomen: Soft, slightly tender, non-distended. Bowel sounds positive x4 and has abdominal scar for whipple.  GU: Deferred. Musculoskeletal: No clubbing / cyanosis of digits/nails.  Normal strength and muscle tone.  Skin: No rashes, lesions, ulcers on a limited skin evaluation. No induration; Warm and dry.  Neurologic: CN 2-12 grossly intact with no focal deficits. Romberg sign and cerebellar reflexes not assessed.  Psychiatric: Normal judgment and insight. Alert and oriented x 3. Normal mood and appropriate affect.   Labs on Admission: I have personally reviewed following labs and imaging studies  CBC: Recent Labs  Lab 03/30/18 1238 04/03/18 1704  WBC 10.9* 9.0  NEUTROABS 7.9* 6.8  HGB 10.8* 11.6*  HCT 31.6* 33.5*  MCV 88.0 87.9  PLT 139* 435   Basic Metabolic Panel: Recent Labs  Lab 03/28/18 0851 03/30/18 0844 04/03/18 1035 04/03/18 1704  NA 123* 129* 124* 127*  K 4.4 4.4 4.1 4.4  CL 95* 100 96* 98  CO2 21* 20* 20* 20*  GLUCOSE 142* 89 145* 146*  BUN 17 16 20 23   CREATININE 1.69* 1.56* 1.71* 1.57*  CALCIUM 10.2 10.4* 10.2 9.7  MG  --   --   --  1.4*  PHOS  --   --   --  3.7   GFR: Estimated Creatinine Clearance: 36 mL/min (A) (by C-G formula based on SCr of 1.57 mg/dL (H)). Liver Function Tests: Recent Labs  Lab 03/28/18 0851 04/03/18 1035 04/03/18 1704  AST 13* 13* 15  ALT 15 13 15   ALKPHOS 107 107 89  BILITOT 0.5 0.5 0.7  PROT 7.0 7.0 6.9  ALBUMIN 4.1 3.9 3.8   No results for input(s):  LIPASE, AMYLASE in the last 168 hours. No results for input(s): AMMONIA in the last 168 hours. Coagulation Profile: No results for input(s): INR, PROTIME in the last 168 hours. Cardiac Enzymes: No results for input(s): CKTOTAL, CKMB, CKMBINDEX,  TROPONINI in the last 168 hours. BNP (last 3 results) No results for input(s): PROBNP in the last 8760 hours. HbA1C: No results for input(s): HGBA1C in the last 72 hours. CBG: No results for input(s): GLUCAP in the last 168 hours. Lipid Profile: No results for input(s): CHOL, HDL, LDLCALC, TRIG, CHOLHDL, LDLDIRECT in the last 72 hours. Thyroid Function Tests: No results for input(s): TSH, T4TOTAL, FREET4, T3FREE, THYROIDAB in the last 72 hours. Anemia Panel: No results for input(s): VITAMINB12, FOLATE, FERRITIN, TIBC, IRON, RETICCTPCT in the last 72 hours. Urine analysis:    Component Value Date/Time   COLORURINE YELLOW 09/26/2017 1901   APPEARANCEUR HAZY (A) 09/26/2017 1901   LABSPEC 1.024 09/26/2017 1901   PHURINE 5.0 09/26/2017 1901   GLUCOSEU 50 (A) 09/26/2017 1901   HGBUR NEGATIVE 09/26/2017 1901   BILIRUBINUR NEGATIVE 09/26/2017 1901   KETONESUR NEGATIVE 09/26/2017 1901   PROTEINUR NEGATIVE 09/26/2017 1901   NITRITE NEGATIVE 09/26/2017 1901   LEUKOCYTESUR NEGATIVE 09/26/2017 1901   Sepsis Labs: !!!!!!!!!!!!!!!!!!!!!!!!!!!!!!!!!!!!!!!!!!!! @LABRCNTIP (procalcitonin:4,lacticidven:4) )No results found for this or any previous visit (from the past 240 hour(s)).   Radiological Exams on Admission: No results found.  EKG: No EKG done yet as patient was a direct admission but will do one now.   Assessment/Plan Active Problems:   Severe aortic stenosis   Cancer of head of pancreas (HCC)   Essential hypertension, benign   CAD (coronary artery disease)   Former smoker   Dehydration   FTT (failure to thrive) in adult   Hyponatremia   Adenocarcinoma of head of pancreas ([place])   Debility  Adult Failure to Thrive status post Whipple  seizure presenting with poor p.o. intake and diarrhea along with dehydration -Place in Observation telemetry -Continue with supportive care; Given IVF at Oncology Office -Consult nutrition -Continue IV fluid hydration at a rate of 75 mL/hr -PT/OT to evaluate and treat  Orthostatic hypotension in the setting of dehydration from poor p.o. Intake -Hold home antihypertensives but will continue Coreg -Continue with IV fluid hydration as above -Check orthostatic vital signs and then again  in the a.m.  Hyponatremia -Patient's sodium was 124 this morning -Given IVF boluses in Oncology office and now is improved to 127 -Continue with IV fluid hydration as above -Repeat CMP in the a.m.  CKD stage III in the setting of dehydration and poor p.o. Intake; has history of left renal nephrectomy for renal mass -BUN/Cr slightly elevated from last time and was 20/1.71 and now Improved to 23/1.57  -Continue with IV fluid hydration as above -Avoid nephrotoxic medications and hold home lisinopril -Continue to monitor and trend renal function -Repeat CMP in AM.  Hypertension -Hold home Lisinopril as he was severely hypotensive earlier with a BP of 78/58 -C/w Coreg  Left renal mass status post left nephrectomy -BUN/Cr trending down   Pancreatic adenocarcinoma status post Whipple procedure -Sees Oncology Dr. Julieanne Manson -Currently not on chemotherapy at this time but has been in the past -Dr. Benay Spice to see in the a.m.  Normocytic Anemia -Likely in the setting of chronic kidney disease -Hemoglobin/hematocrit was 10.8/31.6 and now was 11.6/33.5 -Repeat in AM. -Continue to monitor for signs and symptoms of bleeding  Diarrhea -Has had 1-2 Mushy stools since Whipple and this is chronic -Continue to Monitor  DVT prophylaxis: Heparin 5,000 units sq q8h Code Status: FULL CODE Family Communication: Discussed with wife at bedside  Disposition Plan: Anticipate D/C  Consults called: None; Oncology  Aware  Admission status: Obs Telemetry  Severity  of Illness: The appropriate patient status for this patient is OBSERVATION. Observation status is judged to be reasonable and necessary in order to provide the required intensity of service to ensure the patient's safety. The patient's presenting symptoms, physical exam findings, and initial radiographic and laboratory data in the context of their medical condition is felt to place them at decreased risk for further clinical deterioration. Furthermore, it is anticipated that the patient will be medically stable for discharge from the hospital within 2 midnights of admission. The following factors support the patient status of observation.   " The patient's presenting symptoms include Fatigue and Dizziness. " The physical exam findings include Skin tenting and dry mucous membranes. " The initial radiographic and laboratory data are indicative of dehydration  Kerney Elbe, D.O. [facility] Pager [phone]  If 7PM-7AM, please contact night-coverage [email or web address] Password [facility]  04/03/2018, 6:15 PM

## 2018-04-03 NOTE — Progress Notes (Addendum)
Manuel Olson OFFICE PROGRESS NOTE   Diagnosis: Pancreas cancer  INTERVAL HISTORY:   Manuel Olson returns as scheduled.  He continues to feel "tired and dizzy".  His wife notes improvement in fluid intake as well as appetite.  They note that he "sleeps a lot".  He sleeps at night and estimates sleeping about 9 hours during the day.  He denies nausea/vomiting.  He has 1-2 bowel movements a day.  The stool is not watery.  No fever.  Objective:  Vital signs in last 24 hours:  Blood pressure 103/69, pulse 86, temperature 98.1 F (36.7 C), temperature source Oral, resp. rate 18, height 5' 9"  (1.753 m), weight 136 lb 1.6 oz (61.7 kg), SpO2 100 %.    HEENT: Tongue appears dry. Resp: Lungs clear bilaterally. Cardio: Regular rate and rhythm. GI: Abdomen soft and nontender.  No hepatomegaly.  Healed midline surgical incision. Vascular: No leg edema. Neuro: Alert and oriented. Skin: Decreased skin turgor. Port-A-Cath without erythema.   Lab Results:  Lab Results  Component Value Date   WBC 10.9 (H) 03/30/2018   HGB 10.8 (L) 03/30/2018   HCT 31.6 (L) 03/30/2018   MCV 88.0 03/30/2018   PLT 139 (L) 03/30/2018   NEUTROABS 7.9 (H) 03/30/2018    Imaging:  No results found.  Medications: I have reviewed the patient's current medications.  Assessment/Plan: 1. Adenocarcinoma of the pancreas head/neck, status post an EUS biopsy 08/02/2017 confirming adenocarcinoma ? EUS 08/02/2017-pancreas head/neck mass, abutment of the portal vein, no peripancreatic adenopathy ? MRI abdomen 07/27/2017-head/neck pancreas mass with mass-effect on the portal splenic confluence, no involvement of the celiac axis or superior mesenteric artery, no evidence of metastatic disease ? PET scan 08/31/2017-very hypermetabolism at the pancreatic head mass, no evidence of metastatic disease. The isolated right lower lobe nodule below PET resolution, left sided renal mass with decreased activity  relative to the normal adjacent kidney ? Cycle 1 FOLFIRINOX 09/16/2017 ? Cycle 2 FOLFOX 10/16/2017 ? Cycle 3 FOLFOX 10/30/2017 ? Cycle 4 FOLFIRINOX 11/14/2017 (irinotecan resumed with a dose reduction) ? Cycle 5 FOLFIRINOX3/20/2019 (Oxaliplatin dose reduced due to thrombocytopenia) ? Cycle 6 FOLFIRINOX4/11/2017 ? CTs 12/25/2017-stable right lower lobe nodule, decrease in size of the pancreas head mass, stable left kidney mass ? Pancreaticoduodenectomy and left nephrectomy 02/19/2018,pT1cpN1, 2/18 lymph nodes positive, lymphovascular and perineural invasion present, mild to moderate treatment effect, tumor involved adherent portal vein with negative portal vein margins  2. 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 Barry Dienes  9. G of nausea, diarrhea, and abdominal pain-chemotherapy enteritis versus bowel obstruction versus ischemic enteritis  10. Dehydration on 03/19/2018-status treatments with IV fluids over the past 2 weeks   Disposition: Manuel Olson is a 72 year old man with pancreas cancer status post Whipple procedure 02/19/2018.  He has developed failure to thrive.  He required IV fluids last week.  He continues to appear dehydrated and is now hypotensive.  We recommend hospitalization for intravenous hydration/further evaluation.  Dr. Benay Spice has spoken with the hospitalist at [facility].  Arrangements are being made for hospital admission.  We will arrange for outpatient follow-up pending the hospital course.  Patient seen with Dr. Benay Spice.  [name]  ANP/GNP-BC   04/03/2018  11:53 AM [name] continues to have failure to thrive.  He appears dehydrated today.  He has received multiple treatments with IV fluids over the past several weeks.  I recommend hospital admission for further evaluation of the persistent dehydration.  Julieanne Manson, MD

## 2018-04-04 DIAGNOSIS — I10 Essential (primary) hypertension: Secondary | ICD-10-CM | POA: Diagnosis not present

## 2018-04-04 DIAGNOSIS — Z87891 Personal history of nicotine dependence: Secondary | ICD-10-CM | POA: Diagnosis not present

## 2018-04-04 DIAGNOSIS — I129 Hypertensive chronic kidney disease with stage 1 through stage 4 chronic kidney disease, or unspecified chronic kidney disease: Secondary | ICD-10-CM | POA: Diagnosis present

## 2018-04-04 DIAGNOSIS — E860 Dehydration: Principal | ICD-10-CM

## 2018-04-04 DIAGNOSIS — E871 Hypo-osmolality and hyponatremia: Secondary | ICD-10-CM | POA: Diagnosis not present

## 2018-04-04 DIAGNOSIS — I350 Nonrheumatic aortic (valve) stenosis: Secondary | ICD-10-CM | POA: Diagnosis present

## 2018-04-04 DIAGNOSIS — Z79899 Other long term (current) drug therapy: Secondary | ICD-10-CM | POA: Diagnosis not present

## 2018-04-04 DIAGNOSIS — I951 Orthostatic hypotension: Secondary | ICD-10-CM

## 2018-04-04 DIAGNOSIS — Z7902 Long term (current) use of antithrombotics/antiplatelets: Secondary | ICD-10-CM | POA: Diagnosis not present

## 2018-04-04 DIAGNOSIS — Z8673 Personal history of transient ischemic attack (TIA), and cerebral infarction without residual deficits: Secondary | ICD-10-CM | POA: Diagnosis not present

## 2018-04-04 DIAGNOSIS — K219 Gastro-esophageal reflux disease without esophagitis: Secondary | ICD-10-CM | POA: Diagnosis present

## 2018-04-04 DIAGNOSIS — E785 Hyperlipidemia, unspecified: Secondary | ICD-10-CM | POA: Diagnosis present

## 2018-04-04 DIAGNOSIS — E43 Unspecified severe protein-calorie malnutrition: Secondary | ICD-10-CM | POA: Diagnosis not present

## 2018-04-04 DIAGNOSIS — C250 Malignant neoplasm of head of pancreas: Secondary | ICD-10-CM | POA: Diagnosis not present

## 2018-04-04 DIAGNOSIS — N183 Chronic kidney disease, stage 3 (moderate): Secondary | ICD-10-CM | POA: Diagnosis present

## 2018-04-04 DIAGNOSIS — Z90411 Acquired partial absence of pancreas: Secondary | ICD-10-CM | POA: Diagnosis not present

## 2018-04-04 DIAGNOSIS — E1122 Type 2 diabetes mellitus with diabetic chronic kidney disease: Secondary | ICD-10-CM | POA: Diagnosis present

## 2018-04-04 DIAGNOSIS — Z955 Presence of coronary angioplasty implant and graft: Secondary | ICD-10-CM | POA: Diagnosis not present

## 2018-04-04 DIAGNOSIS — Z905 Acquired absence of kidney: Secondary | ICD-10-CM | POA: Diagnosis not present

## 2018-04-04 DIAGNOSIS — I2510 Atherosclerotic heart disease of native coronary artery without angina pectoris: Secondary | ICD-10-CM | POA: Diagnosis present

## 2018-04-04 DIAGNOSIS — Z952 Presence of prosthetic heart valve: Secondary | ICD-10-CM | POA: Diagnosis not present

## 2018-04-04 DIAGNOSIS — Z681 Body mass index (BMI) 19 or less, adult: Secondary | ICD-10-CM | POA: Diagnosis not present

## 2018-04-04 DIAGNOSIS — R627 Adult failure to thrive: Secondary | ICD-10-CM

## 2018-04-04 DIAGNOSIS — Z794 Long term (current) use of insulin: Secondary | ICD-10-CM | POA: Diagnosis not present

## 2018-04-04 DIAGNOSIS — I252 Old myocardial infarction: Secondary | ICD-10-CM | POA: Diagnosis not present

## 2018-04-04 LAB — COMPREHENSIVE METABOLIC PANEL
ALT: 13 U/L (ref 0–44)
AST: 13 U/L — ABNORMAL LOW (ref 15–41)
Albumin: 3.6 g/dL (ref 3.5–5.0)
Alkaline Phosphatase: 82 U/L (ref 38–126)
Anion gap: 7 (ref 5–15)
BUN: 19 mg/dL (ref 8–23)
CO2: 21 mmol/L — ABNORMAL LOW (ref 22–32)
Calcium: 9.4 mg/dL (ref 8.9–10.3)
Chloride: 100 mmol/L (ref 98–111)
Creatinine, Ser: 1.53 mg/dL — ABNORMAL HIGH (ref 0.61–1.24)
GFR calc Af Amer: 50 mL/min — ABNORMAL LOW (ref >60)
GFR calc non Af Amer: 43 mL/min — ABNORMAL LOW (ref >60)
Glucose, Bld: 135 mg/dL — ABNORMAL HIGH (ref 70–99)
Potassium: 4.4 mmol/L (ref 3.5–5.1)
Sodium: 128 mmol/L — ABNORMAL LOW (ref 135–145)
Total Bilirubin: 0.5 mg/dL (ref 0.3–1.2)
Total Protein: 6.3 g/dL — ABNORMAL LOW (ref 6.5–8.1)

## 2018-04-04 LAB — CBC WITH DIFFERENTIAL/PLATELET
Basophils Absolute: 0 10*3/uL (ref 0.0–0.1)
Basophils Relative: 0 %
Eosinophils Absolute: 0.1 10*3/uL (ref 0.0–0.7)
Eosinophils Relative: 1 %
HCT: 31.7 % — ABNORMAL LOW (ref 39.0–52.0)
Hemoglobin: 10.9 g/dL — ABNORMAL LOW (ref 13.0–17.0)
Lymphocytes Relative: 15 %
Lymphs Abs: 1.4 10*3/uL (ref 0.7–4.0)
MCH: 30.2 pg (ref 26.0–34.0)
MCHC: 34.4 g/dL (ref 30.0–36.0)
MCV: 87.8 fL (ref 78.0–100.0)
Monocytes Absolute: 1.3 10*3/uL — ABNORMAL HIGH (ref 0.1–1.0)
Monocytes Relative: 14 %
Neutro Abs: 6.4 10*3/uL (ref 1.7–7.7)
Neutrophils Relative %: 70 %
Platelets: 172 10*3/uL (ref 150–400)
RBC: 3.61 MIL/uL — ABNORMAL LOW (ref 4.22–5.81)
RDW: 14.5 % (ref 11.5–15.5)
WBC: 9.3 10*3/uL (ref 4.0–10.5)

## 2018-04-04 LAB — OSMOLALITY: Osmolality: 269 mOsm/kg — ABNORMAL LOW (ref 275–295)

## 2018-04-04 LAB — GLUCOSE, CAPILLARY
Glucose-Capillary: 129 mg/dL — ABNORMAL HIGH (ref 70–99)
Glucose-Capillary: 139 mg/dL — ABNORMAL HIGH (ref 70–99)

## 2018-04-04 LAB — MAGNESIUM: Magnesium: 1.4 mg/dL — ABNORMAL LOW (ref 1.7–2.4)

## 2018-04-04 LAB — SODIUM, URINE, RANDOM: Sodium, Ur: 109 mmol/L

## 2018-04-04 LAB — TSH: TSH: 1.267 u[IU]/mL (ref 0.350–4.500)

## 2018-04-04 LAB — PHOSPHORUS: Phosphorus: 3.2 mg/dL (ref 2.5–4.6)

## 2018-04-04 LAB — OSMOLALITY, URINE: Osmolality, Ur: 385 mOsm/kg (ref 300–900)

## 2018-04-04 MED ORDER — MAGNESIUM SULFATE 2 GM/50ML IV SOLN
2.0000 g | Freq: Once | INTRAVENOUS | Status: AC
  Administered 2018-04-04: 2 g via INTRAVENOUS
  Filled 2018-04-04: qty 50

## 2018-04-04 MED ORDER — SODIUM CHLORIDE 0.9% FLUSH: 10.0000 mL | INTRAVENOUS | Status: DC | PRN

## 2018-04-04 MED ORDER — INSULIN GLARGINE 100 UNIT/ML ~~LOC~~ SOLN
14.0000 [IU] | Freq: Every day | SUBCUTANEOUS | Status: DC
  Administered 2018-04-04: 14 [IU] via SUBCUTANEOUS
  Filled 2018-04-04 (×2): qty 0.14

## 2018-04-04 MED ORDER — MEGESTROL ACETATE 400 MG/10ML PO SUSP
200.0000 mg | Freq: Two times a day (BID) | ORAL | Status: DC
  Administered 2018-04-04 – 2018-04-05 (×2): 200 mg via ORAL
  Filled 2018-04-04 (×2): qty 10

## 2018-04-04 NOTE — Evaluation (Signed)
Physical Therapy Evaluation Patient Details Name: Manuel Olson MRN: [id number] DOB: [birth year] Today's Date: 04/04/2018   History of Present Illness  72 y.o. male admitted 04/03/18 for dehydration/hypotension, FFT. H/O nephrectomy  stroke, DM2, pancreatic CA, MI, essential HTN, CAD, TAVR, panceatic cancer, recently in CIR for 3 days.  Clinical Impression  Then patient  Reports feeling dizzy but did ambulate x 400' with occassional use of rail or HHA.   BP supine=135/71, sitting=112/64, after ambulating= 96/72.     Follow Up Recommendations No PT follow up    Equipment Recommendations  None recommended by PT    Recommendations for Other Services       Precautions / Restrictions Precautions Precautions: Fall Precaution Comments: orthostatic, reports dizzinees but is" asymptomatic" for syncope, just watch closely      Mobility  Bed Mobility Overal bed mobility: Independent                Transfers Overall transfer level: Needs assistance Equipment used: None Transfers: Sit to/from Stand Sit to Stand: Supervision            Ambulation/Gait Ambulation/Gait assistance: Min guard;Min assist Gait Distance (Feet): 400 Feet Assistive device: 1 person hand held assist Gait Pattern/deviations: Step-through pattern     General Gait Details: slow and steady gait, intermittent use of rail and 11 HHA.   Stairs            Wheelchair Mobility    Modified Rankin (Stroke Patients Only)       Balance Overall balance assessment: Needs assistance Sitting-balance support: No upper extremity supported;Feet supported Sitting balance-Leahy Scale: Normal     Standing balance support: During functional activity;No upper extremity supported Standing balance-Leahy Scale: Fair                               Pertinent Vitals/Pain Pain Assessment: No/denies pain    Home Living Family/patient expects to be discharged to:: Private residence Living  Arrangements: Spouse/significant other Available Help at Discharge: Family;Available 24 hours/day Type of Home: House Home Access: Stairs to enter Entrance Stairs-Rails: None Entrance Stairs-Number of Steps: 3 Home Layout: One level Home Equipment: None      Prior Function Level of Independence: Independent               Hand Dominance   Dominant Hand: Right    Extremity/Trunk Assessment   Upper Extremity Assessment Upper Extremity Assessment: Defer to OT evaluation    Lower Extremity Assessment Lower Extremity Assessment: Generalized weakness    Cervical / Trunk Assessment Cervical / Trunk Assessment: Normal  Communication   Communication: HOH  Cognition Arousal/Alertness: Awake/alert Behavior During Therapy: WFL for tasks assessed/performed Overall Cognitive Status: Within Functional Limits for tasks assessed                                        General Comments      Exercises     Assessment/Plan    PT Assessment Patient needs continued PT services  PT Problem List Decreased strength;Decreased activity tolerance;Decreased mobility;Cardiopulmonary status limiting activity       PT Treatment Interventions Gait training;Functional mobility training    PT Goals (Current goals can be found in the Care Plan section)  Acute Rehab PT Goals Patient Stated Goal: to go home PT Goal Formulation: With patient Time For Goal Achievement: 04/11/18  Potential to Achieve Goals: Good    Frequency Min 2X/week   Barriers to discharge        Co-evaluation               AM-PAC PT "6 Clicks" Daily Activity  Outcome Measure Difficulty turning over in bed (including adjusting bedclothes, sheets and blankets)?: None Difficulty moving from lying on back to sitting on the side of the bed? : None Difficulty sitting down on and standing up from a chair with arms (e.g., wheelchair, bedside commode, etc,.)?: A Little Help needed moving to and from  a bed to chair (including a wheelchair)?: A Little Help needed walking in hospital room?: A Little Help needed climbing 3-5 steps with a railing? : A Lot 6 Click Score: 19    End of Session   Activity Tolerance: Patient tolerated treatment well Patient left: in bed;with call bell/phone within reach;with bed alarm set;with family/visitor present Nurse Communication: Mobility status PT Visit Diagnosis: Unsteadiness on feet (R26.81)    Time: 6394-3200 PT Time Calculation (min) (ACUTE ONLY): 39 min   Charges:   PT Evaluation $PT Eval Low Complexity: 1 Low PT Treatments $Gait Training: 23-37 mins   PT G CodesTresa Endo PT [phone]   Claretha Cooper 04/04/2018, 12:43 PM

## 2018-04-04 NOTE — Evaluation (Signed)
Occupational Therapy Evaluation Patient Details Name: Manuel Olson MRN: [id number] DOB: 1945/11/28 Today's Date: 04/04/2018    History of Present Illness 72 y.o. male admitted 04/03/18 for dehydration/hypotension, FFT. H/O nephrectomy  stroke, DM2, pancreatic CA, MI, essential HTN, CAD, TAVR, panceatic cancer, recently in CIR for 3 days.   Clinical Impression   Pt admitted with dehydration. Pt currently with functional limitations due to the deficits listed below (see OT Problem List). Pt will benefit from skilled OT to increase their safety and independence with ADL and functional mobility for ADL to facilitate discharge to venue listed below.      Follow Up Recommendations  Supervision/Assistance - 24 hour    Equipment Recommendations  None recommended by OT    Recommendations for Other Services       Precautions / Restrictions Precautions Precautions: Fall Precaution Comments: orthostatic, reports dizzinees but is" asymptomatic" for syncope, just watch closely      Mobility Bed Mobility Overal bed mobility: Independent                Transfers Overall transfer level: Needs assistance Equipment used: None Transfers: Sit to/from Stand Sit to Stand: Supervision         General transfer comment: Pt needed VC to sit EOB before standing as standing prior to walking.    Balance Overall balance assessment: Needs assistance Sitting-balance support: No upper extremity supported;Feet supported Sitting balance-Leahy Scale: Normal     Standing balance support: During functional activity;No upper extremity supported Standing balance-Leahy Scale: Fair                             ADL either performed or assessed with clinical judgement   ADL Overall ADL's : Needs assistance/impaired Eating/Feeding: Total assistance;Sitting   Grooming: Set up;Sitting   Upper Body Bathing: Minimal assistance;Sitting   Lower Body Bathing: Minimal assistance;Sit  to/from stand;Cueing for safety;Cueing for sequencing   Upper Body Dressing : Set up;Sitting   Lower Body Dressing: Minimal assistance;Sit to/from stand   Toilet Transfer: Minimal assistance;Ambulation   Toileting- Clothing Manipulation and Hygiene: Minimal assistance;Sit to/from stand;Cueing for sequencing;Cueing for safety       Functional mobility during ADLs: Minimal assistance General ADL Comments: wife will A as needed     Vision Patient Visual Report: No change from baseline       Perception     Praxis      Pertinent Vitals/Pain Pain Assessment: No/denies pain     Hand Dominance Right   Extremity/Trunk Assessment Upper Extremity Assessment Upper Extremity Assessment: Generalized weakness   Lower Extremity Assessment Lower Extremity Assessment: Generalized weakness   Cervical / Trunk Assessment Cervical / Trunk Assessment: Normal   Communication Communication Communication: HOH   Cognition Arousal/Alertness: Awake/alert Behavior During Therapy: WFL for tasks assessed/performed Overall Cognitive Status: Within Functional Limits for tasks assessed                                                Home Living Family/patient expects to be discharged to:: Private residence Living Arrangements: Spouse/significant other Available Help at Discharge: Family;Available 24 hours/day Type of Home: House Home Access: Stairs to enter [employer] of Steps: 3 Entrance Stairs-Rails: None Home Layout: One level     Bathroom Shower/Tub: Tub/shower unit  Home Equipment: None          Prior Functioning/Environment Level of Independence: Independent                 OT Problem List: Decreased strength;Decreased activity tolerance;Decreased safety awareness;Decreased knowledge of precautions      OT Treatment/Interventions: Self-care/ADL training;Patient/family education    OT Goals(Current goals can be found in the  care plan section) Acute Rehab OT Goals Patient Stated Goal: to go home OT Goal Formulation: With patient Time For Goal Achievement: 04/11/18  OT Frequency: Min 2X/week   Barriers to D/C:               AM-PAC PT "6 Clicks" Daily Activity     Outcome Measure Help from another person eating meals?: A Little Help from another person taking care of personal grooming?: A Little Help from another person toileting, which includes using toliet, bedpan, or urinal?: A Little Help from another person bathing (including washing, rinsing, drying)?: A Little Help from another person to put on and taking off regular upper body clothing?: A Little Help from another person to put on and taking off regular lower body clothing?: A Little 6 Click Score: 18   End of Session    Activity Tolerance: Patient tolerated treatment well Patient left: in chair  OT Visit Diagnosis: Unsteadiness on feet (R26.81);Other abnormalities of gait and mobility (R26.89)                Time: 8786-7672 OT Time Calculation (min): 21 min Charges:  OT General Charges $OT Visit: 1 Visit OT Evaluation $OT Eval Moderate Complexity: 1 Mod G-Codes:     Kari Baars, [place]  Payton Mccallum D 04/04/2018, 4:08 PM

## 2018-04-04 NOTE — Progress Notes (Signed)
Initial Nutrition Assessment  DOCUMENTATION CODES:   Severe malnutrition in context of chronic illness  INTERVENTION:    Ensure Enlive po BID, each supplement provides 350 kcal and 20 grams of protein  Magic cup TID with meals, each supplement provides 290 kcal and 9 grams of protein  NUTRITION DIAGNOSIS:   Severe Malnutrition related to chronic illness, cancer and cancer related treatments as evidenced by energy intake < or equal to 75% for > or equal to 1 month, severe fat depletion, severe muscle depletion, percent weight loss.  GOAL:   Patient will meet greater than or equal to 90% of their needs  MONITOR:   PO intake, Supplement acceptance, Weight trends, Labs  REASON FOR ASSESSMENT:   Consult Assessment of nutrition requirement/status  ASSESSMENT:   Patient with PMH significant for CAD with STEMI,  CVA, DM, HTN, HLD, left renal mass s/p nephrectomy, and pancreatic cancer s/p Whipple 6/10. Directly admitted from the Oncology office for failure to thrive and dehydration.    Pt reports having a poor appetite since beginning chemotherapy at the beginning of the year. States it decreased further after his Whipple procedure. He was recently prescribed Megace BID which has helped a little. A typical day consist of:  B- 2 eggs, a few potatoes, 2 slices of bacon L- a few crackers D- 1/2 hamburger/pork chop, corn, potatoes/rice  Pt sleeps for "hours" during the middle of the day and this prevents him from eating lunch. Pt tried Ensure once before but vomited immediatly and he has not tried it since. He denies taste changes, early satiety, or nausea. Pt has been seen by [place] RD. This RD reiterated eating small more frequent meals to meet daily caloric/protein needs. Discussed other protein supplement options pt could use at home. He would like to try the Ensure while he is here.   Pt endorses a UBW of 179-184 lb, the last time being at that weight prior to his Whipple  procedure. Records indicate pt weighed 162 lb 02/27/18 and 137 lb this admission (15.4% wt loss in one month, significant for time frame). Nutrition-Focused physical exam completed.  Medications reviewed and include: Megace BID, Mg sulfate  Labs reviewed: Na 128 (L)Mg 1.4 (L)  NUTRITION - FOCUSED PHYSICAL EXAM:    Most Recent Value  Orbital Region  Severe depletion  Upper Arm Region  Severe depletion  Thoracic and Lumbar Region  Unable to assess  Buccal Region  Severe depletion  Temple Region  Severe depletion  Clavicle Bone Region  Severe depletion  Clavicle and Acromion Bone Region  Severe depletion  Scapular Bone Region  Unable to assess  Dorsal Hand  Moderate depletion  Patellar Region  Severe depletion  Anterior Thigh Region  Severe depletion  Posterior Calf Region  Severe depletion  Edema (RD Assessment)  None  Hair  Reviewed  Eyes  Reviewed  Mouth  Reviewed  Skin  Reviewed  Nails  Reviewed     Diet Order:   Diet Order           Diet regular Room service appropriate? Yes; Fluid consistency: Thin  Diet effective now          EDUCATION NEEDS:   Education needs have been addressed  Skin:  Skin Assessment: Reviewed RN Assessment  Last BM:  04/03/18  Height:   Ht Readings from Last 1 Encounters:  04/03/18 5' 9"  (1.753 m)    Weight:   Wt Readings from Last 1 Encounters:  04/04/18 137 lb 9.1  oz (62.4 kg)    Ideal Body Weight:  72.7 kg  BMI:  Body mass index is 20.32 kg/m.  Estimated Nutritional Needs:   Kcal:  2200-2400 kcal  Protein:  110-120 grams  Fluid:  >/= 2.2 L/day    Mariana Single RD, LDN Clinical Nutrition Pager # - [phone]

## 2018-04-04 NOTE — Progress Notes (Signed)
PROGRESS NOTE    Manuel Olson  SFK:[id number] DOB: Sep 21, 1945 DOA: 04/03/2018 PCP: Patient, No Pcp Per   Brief Narrative: Manuel Olson is a 72 y.o. male with a history of CAD/STEMI s/p CABG, hypertension, GERD, hyperlipidemia, pancreatic cancer s/p Whipple, left renal mass s/p nephrectomy, severe aortic stenosis s/p TAVR, history of CVA, type 2 diabetes mellitus. 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  There is some improvement with initiation of Megace.  Seen by dietitian and recommended Ensure Enlive twice daily and Magic cup 3 times daily -Continue Megace -Continue IV fluids  Orthostatic hypotension Patient is symptomatic today with lightheadedness. -Continue IV fluids  Hyponatremia Hypovolemic hyponatremia.  Patient with improvement on IV fluids. -Serum osmolality, urine sodium and osmolality -Continue IV fluids  CKD stage III Stable  Essential hypertension Holding lisinopril secondary to hypotension.  We need to discontinue lisinopril on discharge and reevaluate need as an outpatient.  Left renal mass Patient is status post left nephrectomy.  Pancreatic cancer Patient status post Whipple procedure in June 2019.  Patient follows with Dr. Benay Spice as an outpatient.    DVT prophylaxis: Heparin Code Status:   Code Status: Full Code Family Communication: Wife at bedside Disposition Plan: Discharge in 24 hours if improved symptoms from orthostatic hypotension   Consultants:   None  Procedures:   None  Antimicrobials:  None     Subjective: Patient reports some improved appetite after starting Megace.  He reports some lightheadedness when getting up to stand this morning with orthostatic vitals.  Objective: Vitals:   04/03/18 1626 04/03/18 2125 04/04/18 0530 04/04/18 1329  BP: (!) 160/94 (!) 155/81 (!) 157/83 135/76  Pulse: 86 72 72 66  Resp: 18 16 16 16   Temp: 97.8 F (36.6 C) 98.7 F (37.1 C) 98.3 F (36.8 C) 97.8 F (36.6 C)  TempSrc: Oral Oral Oral Oral  SpO2:  97% 97% 100%  Weight: 61.7 kg (136 lb 1.4 oz)  62.4 kg (137 lb 9.1 oz)   Height: 5' 9"  (1.753 m)       Intake/Output Summary (Last 24 hours) at 04/04/2018 1730 Last data filed at 04/04/2018 0602 Gross per 24 hour  Intake 896.25 ml  Output 300 ml  Net 596.25 ml   Filed Weights   04/03/18 1626 04/04/18 0530  Weight: 61.7 kg (136 lb 1.4 oz) 62.4 kg (137 lb 9.1 oz)    Examination:  General exam: Appears calm and comfortable Respiratory system: Clear to auscultation. Respiratory effort normal. Cardiovascular system: S1 & S2 heard, RRR. No murmurs, rubs, gallops or clicks. Gastrointestinal system: Abdomen is nondistended, soft and nontender. No organomegaly or masses felt. Normal bowel sounds heard. Central nervous system: Alert and oriented. No focal neurological deficits. Extremities: No edema. No calf tenderness Skin: No cyanosis. No rashes Psychiatry: Judgement and insight appear normal. Mood & affect appropriate.     Data Reviewed: I have personally reviewed following labs and imaging studies  CBC: Recent Labs  Lab 03/30/18 1238 04/03/18 1704 04/04/18 0451  WBC 10.9*  9.0 9.3  NEUTROABS 7.9* 6.8 6.4  HGB 10.8* 11.6* 10.9*  HCT 31.6* 33.5* 31.7*  MCV 88.0 87.9 87.8  PLT 139* 183 194   Basic Metabolic Panel: Recent Labs  Lab 03/30/18 0844 04/03/18 1035 04/03/18 1704 04/04/18 0451  NA 129* 124* 127* 128*  K 4.4 4.1 4.4 4.4  CL 100 96* 98 100  CO2 20* 20* 20* 21*  GLUCOSE 89 145* 146* 135*  BUN 16 20 23 19    CREATININE 1.56* 1.71* 1.57* 1.53*  CALCIUM 10.4* 10.2 9.7 9.4  MG  --   --  1.4* 1.4*  PHOS  --   --  3.7 3.2   GFR: Estimated Creatinine Clearance: 37.4 mL/min (A) (by C-G formula based on SCr of 1.53 mg/dL (H)). Liver Function Tests: Recent Labs  Lab 04/03/18 1035 04/03/18 1704 04/04/18 0451  AST 13* 15 13*  ALT 13 15 13   ALKPHOS 107 89 82  BILITOT 0.5 0.7 0.5  PROT 7.0 6.9 6.3*  ALBUMIN 3.9 3.8 3.6   No results for input(s): LIPASE, AMYLASE in the last 168 hours. No results for input(s): AMMONIA in the last 168 hours. Coagulation Profile: No results for input(s): INR, PROTIME in the last 168 hours. Cardiac Enzymes: No results for input(s): CKTOTAL, CKMB, CKMBINDEX, TROPONINI in the last 168 hours. BNP (last 3 results) No results for input(s): PROBNP in the last 8760 hours. HbA1C: No results for input(s): HGBA1C in the last 72 hours. CBG: Recent Labs  Lab 04/04/18 0732  GLUCAP 129*   Lipid Profile: No results for input(s): CHOL, HDL, LDLCALC, TRIG, CHOLHDL, LDLDIRECT in the last 72 hours. Thyroid Function Tests: Recent Labs    04/04/18 0950  TSH 1.267   Anemia Panel: No results for input(s): VITAMINB12, FOLATE, FERRITIN, TIBC, IRON, RETICCTPCT in the last 72 hours. Sepsis Labs: No results for input(s): PROCALCITON, LATICACIDVEN in the last 168 hours.  No results found for this or any previous visit (from the past 240 hour(s)).       Radiology Studies: No results found.      Scheduled Meds: . carvedilol  6.25 mg Oral BID WC  . clopidogrel  75 mg Oral Q breakfast  . feeding supplement (ENSURE ENLIVE)  237 mL Oral BID BM  . heparin  5,000 Units Subcutaneous Q8H  . insulin glargine  14 Units Subcutaneous QHS  . megestrol  200 mg Oral BID WC  . pantoprazole  40 mg Oral Q1200   Continuous Infusions: . sodium chloride 75 mL/hr at 04/04/18 0529     LOS: 1 day     Cordelia Poche, MD [facility] 04/04/2018, 5:30 PM Pager: [phone]  If 7PM-7AM, please contact night-coverage [email or web address] 04/04/2018, 5:30 PM

## 2018-04-05 DIAGNOSIS — C250 Malignant neoplasm of head of pancreas: Secondary | ICD-10-CM

## 2018-04-05 DIAGNOSIS — E43 Unspecified severe protein-calorie malnutrition: Secondary | ICD-10-CM

## 2018-04-05 LAB — URINE CULTURE

## 2018-04-05 LAB — BASIC METABOLIC PANEL
Anion gap: 5 (ref 5–15)
BUN: 16 mg/dL (ref 8–23)
CO2: 22 mmol/L (ref 22–32)
Calcium: 9.2 mg/dL (ref 8.9–10.3)
Chloride: 103 mmol/L (ref 98–111)
Creatinine, Ser: 1.34 mg/dL — ABNORMAL HIGH (ref 0.61–1.24)
GFR calc Af Amer: 59 mL/min — ABNORMAL LOW (ref >60)
GFR calc non Af Amer: 51 mL/min — ABNORMAL LOW (ref >60)
Glucose, Bld: 88 mg/dL (ref 70–99)
Potassium: 4 mmol/L (ref 3.5–5.1)
Sodium: 130 mmol/L — ABNORMAL LOW (ref 135–145)

## 2018-04-05 LAB — MAGNESIUM: Magnesium: 1.8 mg/dL (ref 1.7–2.4)

## 2018-04-05 LAB — GLUCOSE, CAPILLARY: Glucose-Capillary: 75 mg/dL (ref 70–99)

## 2018-04-05 MED ORDER — ENSURE ENLIVE PO LIQD
237.0000 mL | Freq: Two times a day (BID) | ORAL | 0 refills | Status: DC
Start: 2018-04-05 — End: 2018-08-02

## 2018-04-05 MED ORDER — HEPARIN SOD (PORK) LOCK FLUSH 100 UNIT/ML IV SOLN
500.0000 [IU] | INTRAVENOUS | Status: AC | PRN
  Administered 2018-04-05: 500 [IU]

## 2018-04-05 NOTE — Discharge Instructions (Signed)
Manuel Olson,  You are admitted because of dehydration.  You are given IV fluids which improved your fluid status.  While here, you had some low blood pressures when you are sitting and standing.  This is called orthostatic hypotension.  This is likely secondary to dehydration and low solute content.  Her symptoms improved with liberalization of your diet.  I recommend continuing a liberalize diet of salt intake.  Please follow-up with your primary care physician/cardiologist.

## 2018-04-05 NOTE — Progress Notes (Signed)
Patient ambulatory around unit 7 times accompanied by RN, patient denies any dizziness or light headedness.  MD notified.

## 2018-04-05 NOTE — Progress Notes (Signed)
Patient/wife refused bath or change from his cloth to hospital gown. Wife stated she will do it when they get home and I informed her that we are not sure he is being discharged today because of his BP.

## 2018-04-05 NOTE — Progress Notes (Signed)
Notified Dr. Lonny Prude of patient's positive orthostatic BP and  Still C/O dizziness.

## 2018-04-05 NOTE — Progress Notes (Signed)
Occupational Therapy Treatment Patient Details Name: Manuel Olson MRN: [id number] DOB: Feb 25, 1946 Today's Date: 04/05/2018    History of present illness 72 y.o. male admitted 04/03/18 for dehydration/hypotension, FFT. H/O nephrectomy  stroke, DM2, pancreatic CA, MI, essential HTN, CAD, TAVR, panceatic cancer, recently in CIR for 3 days.   OT comments  Pts BP had been low this am.  114/68 with OT. Encouraged pt to sit in chair rather than in bed  Follow Up Recommendations  Supervision/Assistance - 24 hour    Equipment Recommendations  None recommended by OT    Recommendations for Other Services      Precautions / Restrictions Precautions Precautions: Fall Precaution Comments: orthostatic, reports dizzinees but is" asymptomatic" for syncope, just watch closely       Mobility Bed Mobility Overal bed mobility: Independent                Transfers Overall transfer level: Needs assistance Equipment used: None Transfers: Sit to/from Stand Sit to Stand: Supervision         General transfer comment: Pt needed VC to sit EOB before standing as standing prior to walking.    Balance Overall balance assessment: Needs assistance Sitting-balance support: No upper extremity supported;Feet supported Sitting balance-Leahy Scale: Normal     Standing balance support: During functional activity;No upper extremity supported Standing balance-Leahy Scale: Fair                             ADL either performed or assessed with clinical judgement   ADL Overall ADL's : Needs assistance/impaired                         Toilet Transfer: Minimal assistance;Stand-pivot Toilet Transfer Details (indicate cue type and reason): bed to chair Toileting- Clothing Manipulation and Hygiene: Minimal assistance;Sit to/from stand;Cueing for sequencing;Cueing for safety                         Cognition Arousal/Alertness: Awake/alert Behavior During  Therapy: WFL for tasks assessed/performed Overall Cognitive Status: Within Functional Limits for tasks assessed                                           Prior Functioning/Environment              Frequency  Min 2X/week        Progress Toward Goals  OT Goals(current goals can now be found in the care plan section)  Progress towards OT goals: Progressing toward goals     Plan Discharge plan remains appropriate    Co-evaluation                    End of Session    OT Visit Diagnosis: Unsteadiness on feet (R26.81);Other abnormalities of gait and mobility (R26.89)   Activity Tolerance Patient tolerated treatment well   Patient Left in chair   Nurse Communication          Time: 1040-1057 OT Time Calculation (min): 17 min  Charges: OT General Charges $OT Visit: 1 Visit OT Treatments $Self Care/Home Management : 8-22 mins  [place], [place]   Betsy Pries 04/05/2018, 11:27 AM

## 2018-04-05 NOTE — Discharge Summary (Signed)
Physician Discharge Summary  Manuel Olson CNO:[id number] DOB: 1946/03/25 DOA: 04/03/2018  PCP: Patient, No Pcp Per  Admit date: 04/03/2018 Discharge date: 04/05/2018  Admitted From: Home Disposition: Home  Recommendations for Outpatient Follow-up:  1. Follow up with PCP/Cardiologist in 1 week 2. Please obtain BMP/CBC in one week 3. Please address restarting of Coreg/lisinopril if orthostatic hypotension improves 4. Please follow up on the following pending results: None  Home Health: None Equipment/Devices: None  Discharge Condition: Stable CODE STATUS: Full code Diet recommendation: Regular diet   Brief/Interim Summary:  Admission HPI written by Kerney Elbe, DO   Chief Complaint: Dizziness; Dehydration  HPI: Manuel Olson is a 72 y.o. male with medical history significant of CAD with STEMI status post DES to the mid LAD and DES to the diagonal as well as DES to proximal to mid RCA, essential hypertension, GERD, prior tobacco abuse, hyperlipidemia, pancreatic cancer status post Whipple procedure, history of left renal mass status post left nephrectomy, severe aortic stenosis status post TAVR, history of CVA, and type 2 diabetes mellitus with other comorbidities who presents to [facility] as a direct admission from oncology office.  Patient states that he has been dizzy every morning and states that he especially gets dizzy when he steps out of bed initially and that some days are better than others.  He had a point where he was not eating well but states that his appetite is improved but states he does not drink appropriately and has never been to rehydrate himself well.  He states he is gotten better but wife states that he is recently been sleeping most of the day and trying to hydrate as best as he can orally.  He presented to his oncology visit today where he is noted to be severely dehydrated and orthostatic and was directed to be admitted directly for IV  fluid hydration.  Patient states that he first started becoming dehydrated after his bowel procedure and that his stools are never formed properly but are mushy.  Patient denies any chest pain, shortness breath, denies any lower extremity swelling.  Denies any burning or discomfort in urine.  States that he feels tired most the time but has improved after IV fluid hydration.  And sleeps most of the day.  Patient was given fluid boluses at the oncology clinic and states he feels better now.  Wife was present at bedside and states that he looks a lot better now as well.  Per my discussion with the patient he states that his wife cooks him big meal for breakfast and big meal for dinner but does not properly eat lunch and usually just lacks.  Also states that he tries to rehydrate orally as best as he can.  TRH was called by Dr. Benay Spice to admit this patient for failure to thrive along with dehydration and orthostatic hypotension.  ED Course: No ED Course as patient was a direct admission     Hospital course:  Failure to thrive Severe protein calorie malnutrition This is been issue since his Whipple procedure in June of this year.  There is some improvement with initiation of Megace.  Seen by dietitian and recommended Ensure Enlive twice daily and Magic cup 3 times daily. Appetite improved with continuation of Megace. Patient given IV fluids.  Orthostatic hypotension Symptoms improved. Likely secondary to decreased solute intake. Improved with management of hyponatremia. Patient ambulated without symptoms.  Hyponatremia Hypovolemic hyponatremia.  Patient with improvement on IV fluids.  Recommended to increase salt intake. Antihypertensives held on discharge.  CKD stage III Stable  Essential hypertension Holding lisinopril secondary to hypotension.  We need to discontinue lisinopril on discharge and reevaluate need as an outpatient.  Left renal mass Patient is status post left  nephrectomy.  Pancreatic cancer Patient status post Whipple procedure in June 2019.  Patient follows with Dr. Benay Spice as an outpatient.    Discharge Diagnoses:  Active Problems:   Severe aortic stenosis   Cancer of head of pancreas (HCC)   Essential hypertension, benign   CAD (coronary artery disease)   Former smoker   Dehydration   FTT (failure to thrive) in adult   Hyponatremia   Adenocarcinoma of head of pancreas (HCC)   Debility   Protein-calorie malnutrition, severe    Discharge Instructions  Discharge Instructions    Call MD for:  persistant dizziness or light-headedness   Complete by:  As directed    Increase activity slowly   Complete by:  As directed      Allergies as of 04/05/2018   No Known Allergies     Medication List    STOP taking these medications   carvedilol 6.25 MG tablet Commonly known as:  COREG   gabapentin 100 MG capsule Commonly known as:  NEURONTIN   lisinopril 10 MG tablet Commonly known as:  PRINIVIL,ZESTRIL   meclizine 12.5 MG tablet Commonly known as:  ANTIVERT     TAKE these medications   acetaminophen 325 MG tablet Commonly known as:  TYLENOL Take 2 tablets (650 mg total) by mouth every 6 (six) hours as needed for mild pain, fever or headache.   atorvastatin 20 MG tablet Commonly known as:  LIPITOR Take 1 tablet (20 mg total) by mouth daily.   baclofen 10 MG tablet Commonly known as:  LIORESAL Take 1 tablet (10 mg total) by mouth 3 (three) times daily as needed for muscle spasms.   BASAGLAR KWIKPEN 100 UNIT/ML Sopn Inject 0.14 mLs (14 Units total) into the skin at bedtime.   clopidogrel 75 MG tablet Commonly known as:  PLAVIX Take 1 tablet (75 mg total) by mouth daily. What changed:  when to take this   feeding supplement (ENSURE ENLIVE) Liqd Take 237 mLs by mouth 2 (two) times daily between meals.   insulin lispro 100 UNIT/ML KiwkPen Commonly known as:  HUMALOG KWIKPEN Inject 0.04-0.06 mLs (4-6 Units total)  into the skin 3 (three) times daily.   megestrol 40 MG/ML suspension Commonly known as:  MEGACE Take 200 mg by mouth daily. 1 tsp BID   pantoprazole 40 MG tablet Commonly known as:  PROTONIX Take 1 tablet (40 mg total) by mouth daily at 12 noon.      Follow-up Information    Satira Sark, MD. Schedule an appointment as soon as possible for a visit in 1 week(s).   Specialty:  Cardiology Contact information: [place] [address] [phone]          No Known Allergies  Consultations:  None   Procedures/Studies: No results found.    Subjective: Patient with mild lightheadedness with ambulation in the morning. Improved.  Discharge Exam: Vitals:   04/05/18 0603 04/05/18 1126  BP: (!) 155/84 114/68  Pulse: 64   Resp: 16   Temp: 98.3 F (36.8 C)   SpO2: 99%    Vitals:   04/04/18 1329 04/04/18 2137 04/05/18 0603 04/05/18 1126  BP: 135/76 125/66 (!) 155/84 114/68  Pulse: 66 67 64  Resp: 16 16 16    Temp: 97.8 F (36.6 C) 98.3 F (36.8 C) 98.3 F (36.8 C)   TempSrc: Oral Oral Oral   SpO2: 100% 98% 99%   Weight:   60.7 kg (133 lb 13.1 oz)   Height:        General: Pt is alert, awake, not in acute distress Cardiovascular: RRR, S1/S2 +, no rubs, no gallops Respiratory: CTA bilaterally, no wheezing, no rhonchi Abdominal: Soft, NT, ND, bowel sounds + Extremities: no edema, no cyanosis    The results of significant diagnostics from this hospitalization (including imaging, microbiology, ancillary and laboratory) are listed below for reference.     Microbiology: Recent Results (from the past 240 hour(s))  Urine culture     Status: Abnormal   Collection Time: 04/03/18  7:45 PM  Result Value Ref Range Status   Specimen Description   Final    URINE, RANDOM Performed at [facility] [address] [place], [place] [address]    Special Requests   Final    NONE Performed at [facility], [place] [address], [place], [place] [address]    Culture MULTIPLE SPECIES PRESENT, SUGGEST RECOLLECTION (A)  Final   Report Status 04/05/2018 FINAL  Final     Labs: BNP (last 3 results) Recent Labs    08/17/17 0851  BNP 188.4*   Basic Metabolic Panel: Recent Labs  Lab 03/30/18 0844 04/03/18 1035 04/03/18 1704 04/04/18 0451 04/05/18 0839  NA 129* 124* 127* 128* 130*  K 4.4 4.1 4.4 4.4 4.0  CL 100 96* 98 100 103  CO2 20* 20* 20* 21* 22  GLUCOSE 89 145* 146* 135* 88  BUN 16 20 23 19 16   CREATININE 1.56* 1.71* 1.57* 1.53* 1.34*  CALCIUM 10.4* 10.2 9.7 9.4 9.2  MG  --   --  1.4* 1.4* 1.8  PHOS  --   --  3.7 3.2  --    Liver Function Tests: Recent Labs  Lab 04/03/18 1035 04/03/18 1704 04/04/18 0451  AST 13* 15 13*  ALT 13 15 13   ALKPHOS 107 89 82  BILITOT 0.5 0.7 0.5  PROT 7.0 6.9 6.3*  ALBUMIN 3.9 3.8 3.6   No results for input(s): LIPASE, AMYLASE in the last 168 hours. No results for input(s): AMMONIA in the last 168 hours. CBC: Recent Labs  Lab 03/30/18 1238 04/03/18 1704 04/04/18 0451  WBC 10.9* 9.0 9.3  NEUTROABS 7.9* 6.8 6.4  HGB 10.8* 11.6* 10.9*  HCT 31.6* 33.5* 31.7*  MCV 88.0 87.9 87.8  PLT 139* 183 172   Cardiac Enzymes: No results for input(s): CKTOTAL, CKMB, CKMBINDEX, TROPONINI in the last 168 hours. BNP: Invalid input(s): POCBNP CBG: Recent Labs  Lab 04/04/18 0732 04/04/18 2140 04/05/18 0723  GLUCAP 129* 139* 75   D-Dimer No results for input(s): DDIMER in the last 72 hours. Hgb A1c No results for input(s): HGBA1C in the last 72 hours. Lipid Profile No results for input(s): CHOL, HDL, LDLCALC, TRIG, CHOLHDL, LDLDIRECT in the last 72 hours. Thyroid function studies Recent Labs    04/04/18 0950  TSH 1.267   Anemia work up No results for input(s): VITAMINB12, FOLATE, FERRITIN, TIBC, IRON, RETICCTPCT in the last 72 hours. Urinalysis    Component Value Date/Time   COLORURINE YELLOW 04/03/2018 1945   APPEARANCEUR CLEAR 04/03/2018  1945   LABSPEC 1.012 04/03/2018 1945   PHURINE 5.0 04/03/2018 1945   GLUCOSEU NEGATIVE 04/03/2018 1945   HGBUR NEGATIVE 04/03/2018 1945  [place] NEGATIVE 04/03/2018 1945   KETONESUR 5 (A) 04/03/2018 1945   PROTEINUR NEGATIVE 04/03/2018 1945   NITRITE NEGATIVE 04/03/2018 1945   LEUKOCYTESUR NEGATIVE 04/03/2018 1945   Sepsis Labs Invalid input(s): PROCALCITONIN,  WBC,  LACTICIDVEN Microbiology Recent Results (from the past 240 hour(s))  Urine culture     Status: Abnormal   Collection Time: 04/03/18  7:45 PM  Result Value Ref Range Status   Specimen Description   Final    URINE, RANDOM Performed at [facility], [place] [address] [place], [place] [address]    Special Requests   Final    NONE Performed at [facility], [place] [address], [place],  [address]    Culture MULTIPLE SPECIES PRESENT, SUGGEST RECOLLECTION (A)  Final   Report Status 04/05/2018 FINAL  Final    SIGNED:   Cordelia Poche, MD [facility] 04/05/2018, 1:02 PM

## 2018-04-05 NOTE — Plan of Care (Signed)
Discharge instructions reviewed with patient and wife, questions answered, verbalized understanding.  Patient transported via wheelchair to main entrance of hospital to be taken home by wife.

## 2018-04-05 NOTE — Progress Notes (Signed)
Spoke with Coralyn Mark at [facility], appointment switched from august 12th at 12 p.m. To July 30th at 9:20 a.m. In the [place] office.  Patient notified of change.

## 2018-04-06 ENCOUNTER — Telehealth: Payer: Self-pay | Admitting: Oncology

## 2018-04-06 NOTE — Telephone Encounter (Signed)
Spoke with patient wife - pt is aware of appt date and time. Per 7/25 sch message.

## 2018-04-09 ENCOUNTER — Encounter: Payer: Self-pay | Admitting: Cardiology

## 2018-04-09 NOTE — Progress Notes (Signed)
Cardiology Office Note  Date: 04/10/2018   ID: Manuel Olson, DOB [birth year], MRN [id number]  PCP: Patient, No Pcp Per  Primary Cardiologist: Rozann Lesches, MD   Chief Complaint  Patient presents with  . Coronary Artery Disease  . Aortic valve disease    History of Present Illness: Manuel Olson is a medically complex 72 y.o. male last seen in April.  I reviewed interval records.  Following our last evaluation he underwent Whipple procedure and also left nephrectomy for concurrent management of both pancreatic cancer and renal mass.  He has continued to follow closely with Oncology.  Recent hospital admission noted with dehydration and orthostasis.  He was hydrated and also evaluated by a dietitian.  He was taken off lisinopril and Coreg due to low blood pressures. I reviewed his recent lab work which is outlined below.  He is here with his wife.  He tells me that he feels weak and short of breath, also still getting dizzy when he stands up.  He has not fallen.  He does not report any fevers or cough.  No orthopnea or PND.  No leg swelling.  I reviewed his medications.  He has had better oral intake on Megace.  He remains off Coreg and lisinopril.  I went over home orthostatic checks, he does still have a significant drop in blood pressure on standing.  I suspect he also has autonomic dysfunction in the setting of his comorbidities and chemotherapy.  Past Medical History:  Diagnosis Date  . Arthritis   . CAD (coronary artery disease)    a. 11/2016 in [place]: STEMI s/p DES to mid LAD, DES to diagonal, DES x 2 to proximal and mid RCA   . Essential hypertension   . Former smoker   . GERD (gastroesophageal reflux disease)   . History of stroke   . Hyperlipidemia   . Myocardial infarction ([place])   . Pancreatic cancer ([place])   . Pneumonia   . Renal mass   . S/P TAVR (transcatheter aortic valve replacement)    a. 08/2017:  Edwards Sapien 3 THV (size 26 mm, model #9600CM26A ,  serial # [id number])  . Severe aortic stenosis    a. 08/2017: s/p TAVR by Dr. Angelena Form and Dr. Cyndia Bent.   . Type 2 diabetes mellitus ([place])     Past Surgical History:  Procedure Laterality Date  . APPENDECTOMY    . CARDIAC CATHETERIZATION    . EUS N/A 08/02/2017   Procedure: UPPER ENDOSCOPIC ULTRASOUND (EUS) RADIAL;  Surgeon: [name], MD;  Location: [facility];  Service: Endoscopy;  Laterality: N/A;  . EYE SURGERY Left   . HAND SURGERY Bilateral   . HERNIA REPAIR Right   . LAPAROSCOPY N/A 02/19/2018   Procedure: DIAGNOSTIC LAPAROSCOPY, ERAS PATHWAY;  Surgeon: [name], MD;  Location: [facility];  Service: General;  Laterality: N/A;  . NEPHRECTOMY Left 02/19/2018   Procedure: OPEN LEFT RADICAL NEPHRECTOMY;  Surgeon: Cleon Gustin, MD;  Location: [place];  Service: Urology;  Laterality: Left;  . PORTACATH PLACEMENT N/A 09/15/2017   Procedure: INSERTION PORT-A-CATH;  Surgeon: [name], MD;  Location: [facility];  Service: General;  Laterality: N/A;  . RIGHT/LEFT HEART CATH AND CORONARY ANGIOGRAPHY N/A 07/05/2017   Procedure: RIGHT/LEFT HEART CATH AND CORONARY ANGIOGRAPHY;  Surgeon: [name], MD;  Location: [facility];  Service: Cardiovascular;  Laterality: N/A;  . SHOULDER ARTHROSCOPY WITH ROTATOR CUFF REPAIR Left   . TEE WITHOUT  CARDIOVERSION N/A 08/22/2017   Procedure: TRANSESOPHAGEAL ECHOCARDIOGRAM (TEE);  Surgeon: Burnell Blanks, MD;  Location: [facility];  Service: Open Heart Surgery;  Laterality: N/A;  . TRANSCATHETER AORTIC VALVE REPLACEMENT, TRANSFEMORAL N/A 08/22/2017   Procedure: TRANSCATHETER AORTIC VALVE REPLACEMENT, TRANSFEMORAL;  Surgeon: Burnell Blanks, MD;  Location: [facility];  Service: Open Heart Surgery;  Laterality: N/A;  . WHIPPLE PROCEDURE N/A 02/19/2018   Procedure: WHIPPLE PROCEDURE;  Surgeon: [name], MD;  Location: [facility];  Service: General;  Laterality: N/A;    Current Outpatient Medications  Medication Sig  Dispense Refill  . acetaminophen (TYLENOL) 325 MG tablet Take 2 tablets (650 mg total) by mouth every 6 (six) hours as needed for mild pain, fever or headache. 100 tablet 0  . atorvastatin (LIPITOR) 20 MG tablet Take 1 tablet (20 mg total) by mouth daily. 90 tablet 1  . baclofen (LIORESAL) 10 MG tablet Take 1 tablet (10 mg total) by mouth 3 (three) times daily as needed for muscle spasms. 30 each 0  . clopidogrel (PLAVIX) 75 MG tablet Take 1 tablet (75 mg total) by mouth daily. (Patient taking differently: Take 75 mg by mouth daily with breakfast. ) 90 tablet 1  . feeding supplement, ENSURE ENLIVE, (ENSURE ENLIVE) LIQD Take 237 mLs by mouth 2 (two) times daily between meals. 60 Bottle 0  . Insulin Glargine (BASAGLAR KWIKPEN) 100 UNIT/ML SOPN Inject 0.14 mLs (14 Units total) into the skin at bedtime. 5 pen 5  . insulin lispro (HUMALOG KWIKPEN) 100 UNIT/ML KiwkPen Inject 0.04-0.06 mLs (4-6 Units total) into the skin 3 (three) times daily. 15 mL 5  . megestrol (MEGACE) 40 MG/ML suspension Take 200 mg by mouth daily. 1 tsp BID     . pantoprazole (PROTONIX) 40 MG tablet Take 1 tablet (40 mg total) by mouth daily at 12 noon. 30 tablet 3  . fludrocortisone (FLORINEF) 0.1 MG tablet Take 1 tablet (0.1 mg total) by mouth daily. 30 tablet 1   No current facility-administered medications for this visit.    Allergies:  Patient has no known allergies.   Social History: The patient  reports that he quit smoking about 1 years ago. His smoking use included cigarettes. He started smoking about 59 years ago. He has a 57.00 pack-year smoking history. He has never used smokeless tobacco. He reports that he does not drink alcohol or use drugs.   ROS:  Please see the history of present illness. Otherwise, complete review of systems is positive for gait instability.  All other systems are reviewed and negative.   Physical Exam: VS:  BP (!) 142/90   Pulse 95   Ht 5' 9"  (1.753 m)   Wt 133 lb (60.3 kg)   SpO2 97%    BMI 19.64 kg/m , BMI Body mass index is 19.64 kg/m.  Wt Readings from Last 3 Encounters:  04/10/18 133 lb (60.3 kg)  04/05/18 133 lb 13.1 oz (60.7 kg)  04/03/18 136 lb 1.6 oz (61.7 kg)    General: Chronically ill-appearing male in no distress. HEENT: Conjunctiva and lids normal, oropharynx clear. Neck: Supple, no elevated JVP or carotid bruits, no thyromegaly. Lungs: Clear to auscultation, nonlabored breathing at rest. Cardiac: Regular rate and rhythm, no S3, 2/6 systolic murmur, no pericardial rub. Abdomen: Soft, nontender, bowel sounds present. Extremities: No pitting edema, distal pulses 2+. Skin: Warm and dry.  Pale. Musculoskeletal: No kyphosis. Neuropsychiatric: Alert and oriented x3, affect grossly appropriate.  ECG: I personally reviewed the tracing from 08/31/2017 which  showed sinus rhythm with prolonged PR interval and poor R wave progression consistent with old anterior wall infarct.  Recent Labwork: 08/17/2017: B Natriuretic Peptide 163.2 04/04/2018: ALT 13; AST 13; Hemoglobin 10.9; Platelets 172; TSH 1.267 04/05/2018: BUN 16; Creatinine, Ser 1.34; Magnesium 1.8; Potassium 4.0; Sodium 130     Component Value Date/Time   CHOL 102 03/16/2017 0840   TRIG 71 03/16/2017 0840   HDL 36 (L) 03/16/2017 0840   CHOLHDL 2.8 03/16/2017 0840   VLDL 14 03/16/2017 0840   LDLCALC 52 03/16/2017 0840    Other Studies Reviewed Today:  Echocardiogram 09/13/2017: Study Conclusions  - Left ventricle: The cavity size was normal. There was mild   concentric hypertrophy. Systolic function was mildly reduced. The   estimated ejection fraction was in the range of 45% to 50%. Wall   motion was normal; there were no regional wall motion   abnormalities. Doppler parameters are consistent with abnormal   left ventricular relaxation (grade 1 diastolic dysfunction).   Doppler parameters are consistent with high ventricular filling   pressure. - Aortic valve: A 9m Edwards Sapien TAVR  bioprosthesis was   present. There was mild stenosis. There was mild perivalvular   regurgitation. Peak velocity (S): 296 cm/s. Mean gradient (S): 16   mm Hg. - Mitral valve: Transvalvular velocity was within the normal range.   There was no evidence for stenosis. There was no regurgitation. - Right ventricle: The cavity size was normal. Wall thickness was   normal. Systolic function was normal. - Atrial septum: No defect or patent foramen ovale was identified   by color flow Doppler.  Assessment and Plan:  1.  Weakness, shortness of breath, and intermittent orthostasis.  This is likely multifactorial, recently complicated by dehydration and poor oral intake, also likely a component of autonomic dysfunction.  We are going to add Florinef 0.1 mg daily, I also encouraged him to use compression stockings consistently for now.  Continue to hold off both Coreg and lisinopril, although we may eventually be able to add back low-dose Coreg.  Office follow-up arranged.  2.  History of severe aortic stenosis status post TAVR, valve functioning appropriately by echocardiogram earlier this year.  No change on examination.  No recent fevers or chills.  3.  Cardiomyopathy with LVEF 45 to 50% as of January of this year.  Holding off on Coreg and lisinopril at this time in light of orthostasis.  4.  Multivessel CAD status post anterior STEMI in 2018 and status post DES to the LAD, DES to the diagonal, and DES x2 to the RCA.  He continues on Plavix and Lipitor.  Current medicines were reviewed with the patient today.  Disposition: Follow-up in August.  Signed, SSatira Sark MD, [facility]7/30/2019 9:44 AM    [place] [id number]: [phone] Fax: [phone]

## 2018-04-10 ENCOUNTER — Encounter: Payer: Self-pay | Admitting: Cardiology

## 2018-04-10 ENCOUNTER — Ambulatory Visit (INDEPENDENT_AMBULATORY_CARE_PROVIDER_SITE_OTHER): Payer: Medicare Other | Admitting: Cardiology

## 2018-04-10 VITALS — BP 142/90 | HR 95 | Ht 69.0 in | Wt 133.0 lb

## 2018-04-10 DIAGNOSIS — Z952 Presence of prosthetic heart valve: Secondary | ICD-10-CM

## 2018-04-10 DIAGNOSIS — I25119 Atherosclerotic heart disease of native coronary artery with unspecified angina pectoris: Secondary | ICD-10-CM | POA: Diagnosis not present

## 2018-04-10 DIAGNOSIS — I255 Ischemic cardiomyopathy: Secondary | ICD-10-CM | POA: Diagnosis not present

## 2018-04-10 DIAGNOSIS — I951 Orthostatic hypotension: Secondary | ICD-10-CM | POA: Diagnosis not present

## 2018-04-10 MED ORDER — FLUDROCORTISONE ACETATE 0.1 MG PO TABS: 0.1000 mg | ORAL_TABLET | Freq: Every day | ORAL | 1 refills | Status: DC

## 2018-04-10 NOTE — Patient Instructions (Addendum)
Medication Instructions:  Your physician has recommended you make the following change in your medication:    START Florinef 0.1 mg daily   Please continue all other medications as prescribed   Labwork: NONE  Testing/Procedures: NONE  Follow-Up: Your physician recommends that you schedule a follow-up appointment in: AUGUST   Any Other Special Instructions Will Be Listed Below (If Applicable).  If you need a refill on your cardiac medications before your next appointment, please call your pharmacy.

## 2018-04-11 ENCOUNTER — Encounter: Payer: Self-pay | Admitting: Oncology

## 2018-04-11 ENCOUNTER — Telehealth: Payer: Self-pay | Admitting: Oncology

## 2018-04-11 ENCOUNTER — Inpatient Hospital Stay (HOSPITAL_BASED_OUTPATIENT_CLINIC_OR_DEPARTMENT_OTHER): Payer: Medicare Other | Admitting: Oncology

## 2018-04-11 VITALS — BP 88/70 | HR 89 | Temp 97.7°F | Resp 18 | Ht 69.0 in | Wt 134.3 lb

## 2018-04-11 DIAGNOSIS — Z87891 Personal history of nicotine dependence: Secondary | ICD-10-CM

## 2018-04-11 DIAGNOSIS — I951 Orthostatic hypotension: Secondary | ICD-10-CM | POA: Diagnosis not present

## 2018-04-11 DIAGNOSIS — E119 Type 2 diabetes mellitus without complications: Secondary | ICD-10-CM

## 2018-04-11 DIAGNOSIS — C250 Malignant neoplasm of head of pancreas: Secondary | ICD-10-CM

## 2018-04-11 DIAGNOSIS — C642 Malignant neoplasm of left kidney, except renal pelvis: Secondary | ICD-10-CM | POA: Diagnosis not present

## 2018-04-11 DIAGNOSIS — R627 Adult failure to thrive: Secondary | ICD-10-CM | POA: Diagnosis not present

## 2018-04-11 DIAGNOSIS — I10 Essential (primary) hypertension: Secondary | ICD-10-CM | POA: Diagnosis not present

## 2018-04-11 DIAGNOSIS — E860 Dehydration: Secondary | ICD-10-CM | POA: Diagnosis not present

## 2018-04-11 NOTE — Progress Notes (Signed)
[facility] OFFICE PROGRESS NOTE   Diagnosis: Pancreas cancer  INTERVAL HISTORY:   Manuel Olson returns as scheduled.  He was discharged on 04/05/2018 after an admission with orthostatic hypotension and dehydration.  He reports a good appetite.  He is taking increased amounts of fluid. He continues to have "dizziness "when standing.  No falls.  The dizziness improves when he ambulates.  He saw Dr. Domenic Polite yesterday.  He was placed on compression stockings and Florinef.  He has not started the Florinef.  Objective:  Vital signs in last 24 hours:  Blood pressure (!) 88/70, pulse 89, temperature 97.7 F (36.5 C), temperature source Oral, resp. rate 18, height 5' 9"  (1.753 m), weight 134 lb 4.8 oz (60.9 kg).    HEENT: The mucous membranes are moist Resp: Lungs clear bilaterally Cardio: Regular rate and rhythm GI: No hepatomegaly, nontender Vascular: No leg edema Neuro: Alert and oriented   Portacath/PICC-without erythema  Lab Results:  Lab Results  Component Value Date   WBC 9.3 04/04/2018   HGB 10.9 (L) 04/04/2018   HCT 31.7 (L) 04/04/2018   MCV 87.8 04/04/2018   PLT 172 04/04/2018   NEUTROABS 6.4 04/04/2018    CMP  Lab Results  Component Value Date   NA 130 (L) 04/05/2018   K 4.0 04/05/2018   CL 103 04/05/2018   CO2 22 04/05/2018   GLUCOSE 88 04/05/2018   BUN 16 04/05/2018   CREATININE 1.34 (H) 04/05/2018   CALCIUM 9.2 04/05/2018   PROT 6.3 (L) 04/04/2018   ALBUMIN 3.6 04/04/2018   AST 13 (L) 04/04/2018   ALT 13 04/04/2018   ALKPHOS 82 04/04/2018   BILITOT 0.5 04/04/2018   GFRNONAA 51 (L) 04/05/2018   GFRAA 59 (L) 04/05/2018     Medications: I have reviewed the patient's current medications.   Assessment/Plan: 1. Adenocarcinoma of the pancreas head/neck, status post an EUS biopsy 08/02/2017 confirming adenocarcinoma ? EUS 08/02/2017-pancreas head/neck mass, abutment of the portal vein, no peripancreatic adenopathy ? MRI abdomen  07/27/2017-head/neck pancreas mass with mass-effect on the portal splenic confluence, no involvement of the celiac axis or superior mesenteric artery, no evidence of metastatic disease ? PET scan 08/31/2017-very hypermetabolism at the pancreatic head mass, no evidence of metastatic disease. The isolated right lower lobe nodule below PET resolution, left sided renal mass with decreased activity relative to the normal adjacent kidney ? Cycle 1 FOLFIRINOX 09/16/2017 ? Cycle 2 FOLFOX 10/16/2017 ? Cycle 3 FOLFOX 10/30/2017 ? Cycle 4 FOLFIRINOX 11/14/2017 (irinotecan resumed with a dose reduction) ? Cycle 5 FOLFIRINOX3/20/2019 (Oxaliplatin dose reduced due to thrombocytopenia) ? Cycle 6 FOLFIRINOX4/11/2017 ? CTs 12/25/2017-stable right lower lobe nodule, decrease in size of the pancreas head mass, stable left kidney mass ? Pancreaticoduodenectomy and left nephrectomy 02/19/2018,pT1cpN1, 2/18 lymph nodes positive, lymphovascular and perineural invasion present, mild to moderate treatment effect, tumor involved adherent portal vein with negative portal vein margins  2. 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 Barry Dienes  9. G of nausea, diarrhea, and abdominal pain-chemotherapy enteritis versus bowel obstruction versus ischemic enteritis  10. Dehydrationon 03/19/2018-status treatments with IV fluids for past 2 weeks  Admission 04/03/2018 with dehydration and hypotension  Persistent orthostatic hypotension- compression stockings and Florinef added  04/10/2018  Disposition: Manuel Olson has an improved appetite, but he continues to have orthostasis.  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  This could be related to 1 of his surgeries or toxicity from chemotherapy.  He will continue support stockings and Florinef.  He knows to get up slowly and use a walker.  He will return for an office and lab visit in 1 week.  We will consider adding midodrine if he has persistent orthostasis.  15 minutes were spent with the patient today.  The majority of the time was used for counseling and coordination of care.  [name], MD  04/11/2018  8:53 AM

## 2018-04-11 NOTE — Telephone Encounter (Signed)
Scheduled appt per 7/31 los - gave pt AVS and calender per los.

## 2018-04-16 ENCOUNTER — Encounter: Payer: Self-pay | Admitting: Nurse Practitioner

## 2018-04-16 ENCOUNTER — Inpatient Hospital Stay: Payer: Medicare Other

## 2018-04-16 ENCOUNTER — Inpatient Hospital Stay: Payer: Medicare Other | Attending: Oncology | Admitting: Nurse Practitioner

## 2018-04-16 ENCOUNTER — Telehealth: Payer: Self-pay | Admitting: Nurse Practitioner

## 2018-04-16 VITALS — BP 151/92 | HR 89 | Temp 97.6°F | Resp 18 | Ht 69.0 in | Wt 135.6 lb

## 2018-04-16 DIAGNOSIS — C250 Malignant neoplasm of head of pancreas: Secondary | ICD-10-CM | POA: Insufficient documentation

## 2018-04-16 DIAGNOSIS — I951 Orthostatic hypotension: Secondary | ICD-10-CM | POA: Insufficient documentation

## 2018-04-16 DIAGNOSIS — I10 Essential (primary) hypertension: Secondary | ICD-10-CM | POA: Diagnosis not present

## 2018-04-16 DIAGNOSIS — E119 Type 2 diabetes mellitus without complications: Secondary | ICD-10-CM | POA: Insufficient documentation

## 2018-04-16 DIAGNOSIS — Z87891 Personal history of nicotine dependence: Secondary | ICD-10-CM | POA: Diagnosis not present

## 2018-04-16 DIAGNOSIS — Z5111 Encounter for antineoplastic chemotherapy: Secondary | ICD-10-CM | POA: Insufficient documentation

## 2018-04-16 DIAGNOSIS — Z95828 Presence of other vascular implants and grafts: Secondary | ICD-10-CM

## 2018-04-16 DIAGNOSIS — C642 Malignant neoplasm of left kidney, except renal pelvis: Secondary | ICD-10-CM | POA: Diagnosis not present

## 2018-04-16 LAB — URINALYSIS, COMPLETE (UACMP) WITH MICROSCOPIC
Bilirubin Urine: NEGATIVE
Glucose, UA: NEGATIVE mg/dL
Hgb urine dipstick: NEGATIVE
Ketones, ur: NEGATIVE mg/dL
Leukocytes, UA: NEGATIVE
Nitrite: NEGATIVE
Protein, ur: NEGATIVE mg/dL
Specific Gravity, Urine: 1.01 (ref 1.005–1.030)
pH: 5 (ref 5.0–8.0)

## 2018-04-16 LAB — CMP (CANCER CENTER ONLY)
ALT: 27 U/L (ref 0–44)
AST: 27 U/L (ref 15–41)
Albumin: 3.7 g/dL (ref 3.5–5.0)
Alkaline Phosphatase: 109 U/L (ref 38–126)
Anion gap: 10 (ref 5–15)
BUN: 15 mg/dL (ref 8–23)
CO2: 21 mmol/L — ABNORMAL LOW (ref 22–32)
Calcium: 9.4 mg/dL (ref 8.9–10.3)
Chloride: 101 mmol/L (ref 98–111)
Creatinine: 1.46 mg/dL — ABNORMAL HIGH (ref 0.61–1.24)
GFR, Est AFR Am: 53 mL/min — ABNORMAL LOW (ref >60)
GFR, Estimated: 46 mL/min — ABNORMAL LOW (ref >60)
Glucose, Bld: 145 mg/dL — ABNORMAL HIGH (ref 70–99)
Potassium: 3.9 mmol/L (ref 3.5–5.1)
Sodium: 132 mmol/L — ABNORMAL LOW (ref 135–145)
Total Bilirubin: 0.5 mg/dL (ref 0.3–1.2)
Total Protein: 6.6 g/dL (ref 6.5–8.1)

## 2018-04-16 LAB — SODIUM, URINE, RANDOM: Sodium, Ur: 108 mmol/L

## 2018-04-16 MED ORDER — HEPARIN SOD (PORK) LOCK FLUSH 100 UNIT/ML IV SOLN
500.0000 [IU] | Freq: Once | INTRAVENOUS | Status: AC
  Administered 2018-04-16: 500 [IU]
  Filled 2018-04-16: qty 5

## 2018-04-16 MED ORDER — SODIUM CHLORIDE 0.9% FLUSH
10.0000 mL | Freq: Once | INTRAVENOUS | Status: AC
  Administered 2018-04-16: 10 mL
  Filled 2018-04-16: qty 10

## 2018-04-16 NOTE — Patient Instructions (Signed)
Implanted Port Home Guide An implanted port is a type of central line that is placed under the skin. Central lines are used to provide IV access when treatment or nutrition needs to be given through a person's veins. Implanted ports are used for long-term IV access. An implanted port may be placed because:  You need IV medicine that would be irritating to the small veins in your hands or arms.  You need long-term IV medicines, such as antibiotics.  You need IV nutrition for a long period.  You need frequent blood draws for lab tests.  You need dialysis.  Implanted ports are usually placed in the chest area, but they can also be placed in the upper arm, the abdomen, or the leg. An implanted port has two main parts:  Reservoir. The reservoir is round and will appear as a small, raised area under your skin. The reservoir is the part where a needle is inserted to give medicines or draw blood.  Catheter. The catheter is a thin, flexible tube that extends from the reservoir. The catheter is placed into a large vein. Medicine that is inserted into the reservoir goes into the catheter and then into the vein.  How will I care for my incision site? Do not get the incision site wet. Bathe or shower as directed by your health care provider. How is my port accessed? Special steps must be taken to access the port:  Before the port is accessed, a numbing cream can be placed on the skin. This helps numb the skin over the port site.  Your health care provider uses a sterile technique to access the port. ? Your health care provider must put on a mask and sterile gloves. ? The skin over your port is cleaned carefully with an antiseptic and allowed to dry. ? The port is gently pinched between sterile gloves, and a needle is inserted into the port.  Only "non-coring" port needles should be used to access the port. Once the port is accessed, a blood return should be checked. This helps ensure that the port  is in the vein and is not clogged.  If your port needs to remain accessed for a constant infusion, a clear (transparent) bandage will be placed over the needle site. The bandage and needle will need to be changed every week, or as directed by your health care provider.  Keep the bandage covering the needle clean and dry. Do not get it wet. Follow your health care provider's instructions on how to take a shower or bath while the port is accessed.  If your port does not need to stay accessed, no bandage is needed over the port.  What is flushing? Flushing helps keep the port from getting clogged. Follow your health care provider's instructions on how and when to flush the port. Ports are usually flushed with saline solution or a medicine called heparin. The need for flushing will depend on how the port is used.  If the port is used for intermittent medicines or blood draws, the port will need to be flushed: ? After medicines have been given. ? After blood has been drawn. ? As part of routine maintenance.  If a constant infusion is running, the port may not need to be flushed.  How long will my port stay implanted? The port can stay in for as long as your health care provider thinks it is needed. When it is time for the port to come out, surgery will be   done to remove it. The procedure is similar to the one performed when the port was put in. When should I seek immediate medical care? When you have an implanted port, you should seek immediate medical care if:  You notice a bad smell coming from the incision site.  You have swelling, redness, or drainage at the incision site.  You have more swelling or pain at the port site or the surrounding area.  You have a fever that is not controlled with medicine.  This information is not intended to replace advice given to you by your health care provider. Make sure you discuss any questions you have with your health care provider. Document  Released: 08/29/2005 Document Revised: 02/04/2016 Document Reviewed: 05/06/2013 Elsevier Interactive Patient Education  2017 [employer].  

## 2018-04-16 NOTE — Telephone Encounter (Signed)
Scheduled appt per 8/5 los - gave patient AVS and calender per los.

## 2018-04-16 NOTE — Progress Notes (Addendum)
[facility] OFFICE PROGRESS NOTE   Diagnosis: Pancreas cancer  INTERVAL HISTORY:   Manuel Olson returns as scheduled.  He is feeling better.  He notes minimal intermittent dizziness with standing.  He continues Florinef and is wearing compression stockings.  He has had no falls.  Oral intake is better.  No longer having diarrhea.  No nausea or vomiting.  No shortness of breath.  Objective:  Vital signs in last 24 hours:  Blood pressure (!) 151/92, pulse 89, temperature 97.6 F (36.4 C), temperature source Oral, resp. rate 18, height 5' 9"  (1.753 m), weight 135 lb 9.6 oz (61.5 kg), SpO2 100 %.    HEENT: No thrush or ulcers. Resp: Lungs clear bilaterally. Cardio: Regular rate and rhythm. GI: Abdomen soft and nontender.  No hepatomegaly. Vascular: No leg edema. Port-A-Cath without erythema.  Lab Results:  Lab Results  Component Value Date   WBC 9.3 04/04/2018   HGB 10.9 (L) 04/04/2018   HCT 31.7 (L) 04/04/2018   MCV 87.8 04/04/2018   PLT 172 04/04/2018   NEUTROABS 6.4 04/04/2018    Imaging:  No results found.  Medications: I have reviewed the patient's current medications.  Assessment/Plan: 1. Adenocarcinoma of the pancreas head/neck, status post an EUS biopsy 08/02/2017 confirming adenocarcinoma ? EUS 08/02/2017-pancreas head/neck mass, abutment of the portal vein, no peripancreatic adenopathy ? MRI abdomen 07/27/2017-head/neck pancreas mass with mass-effect on the portal splenic confluence, no involvement of the celiac axis or superior mesenteric artery, no evidence of metastatic disease ? PET scan 08/31/2017-very hypermetabolism at the pancreatic head mass, no evidence of metastatic disease. The isolated right lower lobe nodule below PET resolution, left sided renal mass with decreased activity relative to the normal adjacent kidney ? Cycle 1 FOLFIRINOX 09/16/2017 ? Cycle 2 FOLFOX 10/16/2017 ? Cycle 3 FOLFOX 10/30/2017 ? Cycle 4 FOLFIRINOX 11/14/2017  (irinotecan resumed with a dose reduction) ? Cycle 5 FOLFIRINOX3/20/2019 (Oxaliplatin dose reduced due to thrombocytopenia) ? Cycle 6 FOLFIRINOX4/11/2017 ? CTs 12/25/2017-stable right lower lobe nodule, decrease in size of the pancreas head mass, stable left kidney mass ? Pancreaticoduodenectomy and left nephrectomy 02/19/2018,pT1cpN1, 2/18 lymph nodes positive, lymphovascular and perineural invasion present, mild to moderate treatment effect, tumor involved adherent portal vein with negative portal vein margins  2. 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 Barry Dienes  9. G of nausea, diarrhea, and abdominal pain-chemotherapy enteritis versus bowel obstruction versus ischemic enteritis  10. Dehydrationon 03/19/2018-statustreatments with IV fluids for past 2 weeks  Admission 04/03/2018 with dehydration and hypotension  Persistent orthostatic hypotension- compression stockings and Florinef added 04/10/2018  Improved 04/16/2018     Disposition: Manuel Olson appears improved.  The orthostasis is better but still persists to some degree.  He will continue Florinef and the compression stockings.  We will hold on adding midodrine.  Dr. Ammie Dalton recommends beginning adjuvant chemotherapy with gemcitabine/Abraxane on a 2-week schedule.  We reviewed potential toxicities including bone marrow toxicity, hair loss.  We discussed the neuropathy associated with Abraxane.  We discussed the possibility of fever, rash, pneumonitis with gemcitabine.  He agrees to proceed.  He will  return to begin cycle 1 on 04/25/2018.  He will return  for lab, follow-up and cycle 2 gemcitabine/Abraxane on 05/09/2018.  He will contact the office in the interim with any problems.  Patient seen with Dr. Benay Spice.  25 minutes were spent face-to-face at today's visit with the majority of that time involved in counseling/coordination of care.  [name] ANP/GNP-BC   04/16/2018  10:54 AM  This was a shared visit with [name].  The orthostasis has partially improved.  His performance status appears improved. Manuel Olson completed 3 months of neoadjuvant chemotherapy.  I recommend adjuvant chemotherapy as his performance status has improved.  He does not appear to be a candidate for additional FOLFIRINOX.  I recommend treatment with every 2-week gemcitabine/Abraxane.  We reviewed potential toxicities associated with this regimen.  He agrees to proceed.  Julieanne Manson, MD

## 2018-04-16 NOTE — Progress Notes (Signed)
DISCONTINUE ON PATHWAY REGIMEN - Pancreatic     A cycle is every 14 days:     Oxaliplatin      Leucovorin      Irinotecan      5-Fluorouracil      5-Fluorouracil   **Always confirm dose/schedule in your pharmacy ordering system**  REASON: Continuation Of Treatment PRIOR TREATMENT: [id number]: FOLFIRINOX q14 Days x 4 Cycles TREATMENT RESPONSE: Stable Disease (SD)  START OFF PATHWAY REGIMEN - Pancreatic   [id number]:Nab-Paclitaxel (Abraxane(R)) 125 mg/m2 D1, 8, 15 + Gemcitabine 1,000 mg/m2 D1, 8, 15 q28 Days:   A cycle is every 28 days:     Nab-paclitaxel (protein bound)      Gemcitabine   **Always confirm dose/schedule in your pharmacy ordering system**  Patient Characteristics: Adenocarcinoma, Resectable, Adjuvant, Negative Margins (Includes Positive Lymph Nodes) Histology: Adenocarcinoma Current evidence of distant metastases<= No AJCC T Category: Staged < 8th Ed. AJCC N Category: Staged < 8th Ed. AJCC M Category: Staged < 8th Ed. AJCC 8 Stage Grouping: Staged < 8th Ed. Treatment Setting: Adjuvant Intent of Therapy: Curative Intent, Discussed with Patient

## 2018-04-22 ENCOUNTER — Other Ambulatory Visit: Payer: Self-pay | Admitting: Oncology

## 2018-04-23 ENCOUNTER — Ambulatory Visit: Payer: Medicare Other | Admitting: Cardiology

## 2018-04-24 ENCOUNTER — Telehealth: Payer: Self-pay

## 2018-04-24 NOTE — Telephone Encounter (Signed)
Wife notified that Dr.McDowell reviewed BP log and recommended stay the same with  Medications.wife states they will continue to monitor BP

## 2018-04-25 ENCOUNTER — Inpatient Hospital Stay: Payer: Medicare Other

## 2018-04-25 ENCOUNTER — Other Ambulatory Visit: Payer: Self-pay

## 2018-04-25 VITALS — BP 177/89 | HR 72 | Temp 98.0°F | Resp 18

## 2018-04-25 DIAGNOSIS — C250 Malignant neoplasm of head of pancreas: Secondary | ICD-10-CM

## 2018-04-25 DIAGNOSIS — C642 Malignant neoplasm of left kidney, except renal pelvis: Secondary | ICD-10-CM | POA: Diagnosis not present

## 2018-04-25 DIAGNOSIS — E119 Type 2 diabetes mellitus without complications: Secondary | ICD-10-CM | POA: Diagnosis not present

## 2018-04-25 DIAGNOSIS — Z5111 Encounter for antineoplastic chemotherapy: Secondary | ICD-10-CM | POA: Diagnosis not present

## 2018-04-25 DIAGNOSIS — I10 Essential (primary) hypertension: Secondary | ICD-10-CM | POA: Diagnosis not present

## 2018-04-25 DIAGNOSIS — I951 Orthostatic hypotension: Secondary | ICD-10-CM | POA: Diagnosis not present

## 2018-04-25 LAB — CBC WITH DIFFERENTIAL (CANCER CENTER ONLY)
Basophils Absolute: 0 10*3/uL (ref 0.0–0.1)
Basophils Relative: 1 %
Eosinophils Absolute: 0.1 10*3/uL (ref 0.0–0.5)
Eosinophils Relative: 1 %
HCT: 31.8 % — ABNORMAL LOW (ref 38.4–49.9)
Hemoglobin: 10.6 g/dL — ABNORMAL LOW (ref 13.0–17.1)
Lymphocytes Relative: 17 %
Lymphs Abs: 1.2 10*3/uL (ref 0.9–3.3)
MCH: 29.8 pg (ref 27.2–33.4)
MCHC: 33.2 g/dL (ref 32.0–36.0)
MCV: 90 fL (ref 79.3–98.0)
Monocytes Absolute: 0.8 10*3/uL (ref 0.1–0.9)
Monocytes Relative: 12 %
Neutro Abs: 4.7 10*3/uL (ref 1.5–6.5)
Neutrophils Relative %: 69 %
Platelet Count: 173 10*3/uL (ref 140–400)
RBC: 3.54 MIL/uL — ABNORMAL LOW (ref 4.20–5.82)
RDW: 15.6 % — ABNORMAL HIGH (ref 11.0–14.6)
WBC Count: 6.8 10*3/uL (ref 4.0–10.3)

## 2018-04-25 LAB — CMP (CANCER CENTER ONLY)
ALT: 25 U/L (ref 0–44)
AST: 19 U/L (ref 15–41)
Albumin: 3.5 g/dL (ref 3.5–5.0)
Alkaline Phosphatase: 120 U/L (ref 38–126)
Anion gap: 12 (ref 5–15)
BUN: 14 mg/dL (ref 8–23)
CO2: 19 mmol/L — ABNORMAL LOW (ref 22–32)
Calcium: 9 mg/dL (ref 8.9–10.3)
Chloride: 104 mmol/L (ref 98–111)
Creatinine: 1.36 mg/dL — ABNORMAL HIGH (ref 0.61–1.24)
GFR, Est AFR Am: 58 mL/min — ABNORMAL LOW (ref >60)
GFR, Estimated: 50 mL/min — ABNORMAL LOW (ref >60)
Glucose, Bld: 166 mg/dL — ABNORMAL HIGH (ref 70–99)
Potassium: 3.5 mmol/L (ref 3.5–5.1)
Sodium: 135 mmol/L (ref 135–145)
Total Bilirubin: 0.4 mg/dL (ref 0.3–1.2)
Total Protein: 6.5 g/dL (ref 6.5–8.1)

## 2018-04-25 MED ORDER — PROCHLORPERAZINE MALEATE 10 MG PO TABS
ORAL_TABLET | ORAL | Status: AC
  Filled 2018-04-25: qty 1

## 2018-04-25 MED ORDER — HEPARIN SOD (PORK) LOCK FLUSH 100 UNIT/ML IV SOLN
500.0000 [IU] | Freq: Once | INTRAVENOUS | Status: AC | PRN
Start: 2018-04-25 — End: 2018-04-25
  Administered 2018-04-25: 500 [IU]
  Filled 2018-04-25: qty 5

## 2018-04-25 MED ORDER — PROCHLORPERAZINE MALEATE 10 MG PO TABS
10.0000 mg | ORAL_TABLET | Freq: Once | ORAL | Status: AC
  Administered 2018-04-25: 10 mg via ORAL

## 2018-04-25 MED ORDER — SODIUM CHLORIDE 0.9 % IV SOLN
Freq: Once | INTRAVENOUS | Status: AC
  Administered 2018-04-25: 11:00:00 via INTRAVENOUS
  Filled 2018-04-25: qty 250

## 2018-04-25 MED ORDER — PACLITAXEL PROTEIN-BOUND CHEMO INJECTION 100 MG
100.0000 mg/m2 | Freq: Once | Status: AC
  Administered 2018-04-25: 175 mg via INTRAVENOUS
  Filled 2018-04-25: qty 35

## 2018-04-25 MED ORDER — SODIUM CHLORIDE 0.9% FLUSH
10.0000 mL | INTRAVENOUS | Status: DC | PRN
Start: 2018-04-25 — End: 2018-04-25
  Administered 2018-04-25: 10 mL
  Filled 2018-04-25: qty 10

## 2018-04-25 MED ORDER — GEMCITABINE HCL CHEMO INJECTION 1 GM/26.3ML
800.0000 mg/m2 | Freq: Once | INTRAVENOUS | Status: AC
  Administered 2018-04-25: 1368 mg via INTRAVENOUS
  Filled 2018-04-25: qty 35.98

## 2018-04-25 NOTE — Progress Notes (Signed)
OK to treat with BP per Dr. Benay Spice

## 2018-04-25 NOTE — Patient Instructions (Signed)
[facility] Discharge Instructions for Patients Receiving Chemotherapy  Today you received the following chemotherapy agents Abraxane, Gemzar  To help prevent nausea and vomiting after your treatment, we encourage you to take your nausea medication as prescribed.   If you develop nausea and vomiting that is not controlled by your nausea medication, call the clinic.   BELOW ARE SYMPTOMS THAT SHOULD BE REPORTED IMMEDIATELY:  *FEVER GREATER THAN 100.5 F  *CHILLS WITH OR WITHOUT FEVER  NAUSEA AND VOMITING THAT IS NOT CONTROLLED WITH YOUR NAUSEA MEDICATION  *UNUSUAL SHORTNESS OF BREATH  *UNUSUAL BRUISING OR BLEEDING  TENDERNESS IN MOUTH AND THROAT WITH OR WITHOUT PRESENCE OF ULCERS  *URINARY PROBLEMS  *BOWEL PROBLEMS  UNUSUAL RASH Items with * indicate a potential emergency and should be followed up as soon as possible.  Feel free to call the clinic should you have any questions or concerns. The clinic phone number is [phone].  Please show the [facility] at check-in to the Emergency Department and triage nurse.  Nanoparticle Albumin-Bound Paclitaxel injection What is this medicine? NANOPARTICLE ALBUMIN-BOUND PACLITAXEL (Na no PAHR ti kuhl al BYOO muhn-bound PAK li TAX el) is a chemotherapy drug. It targets fast dividing cells, like cancer cells, and causes these cells to die. This medicine is used to treat advanced breast cancer and advanced lung cancer. This medicine may be used for other purposes; ask your health care provider or pharmacist if you have questions. COMMON BRAND NAME(S): Abraxane What should I tell my health care provider before I take this medicine? They need to know if you have any of these conditions: -kidney disease -liver disease -low blood counts, like low platelets, red blood cells, or white blood cells -recent or ongoing radiation therapy -an unusual or allergic reaction to paclitaxel, albumin, other chemotherapy, other  medicines, foods, dyes, or preservatives -pregnant or trying to get pregnant -breast-feeding How should I use this medicine? This drug is given as an infusion into a vein. It is administered in a hospital or clinic by a specially trained health care professional. Talk to your pediatrician regarding the use of this medicine in children. Special care may be needed. Overdosage: If you think you have taken too much of this medicine contact a poison control center or emergency room at once. NOTE: This medicine is only for you. Do not share this medicine with others. What if I miss a dose? It is important not to miss your dose. Call your doctor or health care professional if you are unable to keep an appointment. What may interact with this medicine? -cyclosporine -diazepam -ketoconazole -medicines to increase blood counts like filgrastim, pegfilgrastim, sargramostim -other chemotherapy drugs like cisplatin, doxorubicin, epirubicin, etoposide, teniposide, vincristine -quinidine -testosterone -vaccines -verapamil Talk to your doctor or health care professional before taking any of these medicines: -acetaminophen -aspirin -ibuprofen -ketoprofen -naproxen This list may not describe all possible interactions. Give your health care provider a list of all the medicines, herbs, non-prescription drugs, or dietary supplements you use. Also tell them if you smoke, drink alcohol, or use illegal drugs. Some items may interact with your medicine. What should I watch for while using this medicine? Your condition will be monitored carefully while you are receiving this medicine. You will need important blood work done while you are taking this medicine. This medicine can cause serious allergic reactions. If you experience allergic reactions like skin rash, itching or hives, swelling of the face, lips, or tongue, tell your doctor or health care  professional right away. In some cases, you may be given  additional medicines to help with side effects. Follow all directions for their use. This drug may make you feel generally unwell. This is not uncommon, as chemotherapy can affect healthy cells as well as cancer cells. Report any side effects. Continue your course of treatment even though you feel ill unless your doctor tells you to stop. Call your doctor or health care professional for advice if you get a fever, chills or sore throat, or other symptoms of a cold or flu. Do not treat yourself. This drug decreases your body's ability to fight infections. Try to avoid being around people who are sick. This medicine may increase your risk to bruise or bleed. Call your doctor or health care professional if you notice any unusual bleeding. Be careful brushing and flossing your teeth or using a toothpick because you may get an infection or bleed more easily. If you have any dental work done, tell your dentist you are receiving this medicine. Avoid taking products that contain aspirin, acetaminophen, ibuprofen, naproxen, or ketoprofen unless instructed by your doctor. These medicines may hide a fever. Do not become pregnant while taking this medicine. Women should inform their doctor if they wish to become pregnant or think they might be pregnant. There is a potential for serious side effects to an unborn child. Talk to your health care professional or pharmacist for more information. Do not breast-feed an infant while taking this medicine. Men are advised not to father a child while receiving this medicine. What side effects may I notice from receiving this medicine? Side effects that you should report to your doctor or health care professional as soon as possible: -allergic reactions like skin rash, itching or hives, swelling of the face, lips, or tongue -low blood counts - This drug may decrease the number of white blood cells, red blood cells and platelets. You may be at increased risk for infections and  bleeding. -signs of infection - fever or chills, cough, sore throat, pain or difficulty passing urine -signs of decreased platelets or bleeding - bruising, pinpoint red spots on the skin, black, tarry stools, nosebleeds -signs of decreased red blood cells - unusually weak or tired, fainting spells, lightheadedness -breathing problems -changes in vision -chest pain -high or low blood pressure -mouth sores -nausea and vomiting -pain, swelling, redness or irritation at the injection site -pain, tingling, numbness in the hands or feet -slow or irregular heartbeat -swelling of the ankle, feet, hands Side effects that usually do not require medical attention (report to your doctor or health care professional if they continue or are bothersome): -aches, pains -changes in the color of fingernails -diarrhea -hair loss -loss of appetite This list may not describe all possible side effects. Call your doctor for medical advice about side effects. You may report side effects to FDA at 1-800-FDA-1088. Where should I keep my medicine? This drug is given in a hospital or clinic and will not be stored at home. NOTE: This sheet is a summary. It may not cover all possible information. If you have questions about this medicine, talk to your doctor, pharmacist, or health care provider.  2018 Elsevier/Gold Standard (2015-07-01 10:05:20)  Gemcitabine injection What is this medicine? GEMCITABINE (jem SIT a been) is a chemotherapy drug. This medicine is used to treat many types of cancer like breast cancer, lung cancer, pancreatic cancer, and ovarian cancer. This medicine may be used for other purposes; ask your health care provider or  pharmacist if you have questions. COMMON BRAND NAME(S): Gemzar What should I tell my health care provider before I take this medicine? They need to know if you have any of these conditions: -blood disorders -infection -kidney disease -liver disease -recent or ongoing  radiation therapy -an unusual or allergic reaction to gemcitabine, other chemotherapy, other medicines, foods, dyes, or preservatives -pregnant or trying to get pregnant -breast-feeding How should I use this medicine? This drug is given as an infusion into a vein. It is administered in a hospital or clinic by a specially trained health care professional. Talk to your pediatrician regarding the use of this medicine in children. Special care may be needed. Overdosage: If you think you have taken too much of this medicine contact a poison control center or emergency room at once. NOTE: This medicine is only for you. Do not share this medicine with others. What if I miss a dose? It is important not to miss your dose. Call your doctor or health care professional if you are unable to keep an appointment. What may interact with this medicine? -medicines to increase blood counts like filgrastim, pegfilgrastim, sargramostim -some other chemotherapy drugs like cisplatin -vaccines Talk to your doctor or health care professional before taking any of these medicines: -acetaminophen -aspirin -ibuprofen -ketoprofen -naproxen This list may not describe all possible interactions. Give your health care provider a list of all the medicines, herbs, non-prescription drugs, or dietary supplements you use. Also tell them if you smoke, drink alcohol, or use illegal drugs. Some items may interact with your medicine. What should I watch for while using this medicine? Visit your doctor for checks on your progress. This drug may make you feel generally unwell. This is not uncommon, as chemotherapy can affect healthy cells as well as cancer cells. Report any side effects. Continue your course of treatment even though you feel ill unless your doctor tells you to stop. In some cases, you may be given additional medicines to help with side effects. Follow all directions for their use. Call your doctor or health care  professional for advice if you get a fever, chills or sore throat, or other symptoms of a cold or flu. Do not treat yourself. This drug decreases your body's ability to fight infections. Try to avoid being around people who are sick. This medicine may increase your risk to bruise or bleed. Call your doctor or health care professional if you notice any unusual bleeding. Be careful brushing and flossing your teeth or using a toothpick because you may get an infection or bleed more easily. If you have any dental work done, tell your dentist you are receiving this medicine. Avoid taking products that contain aspirin, acetaminophen, ibuprofen, naproxen, or ketoprofen unless instructed by your doctor. These medicines may hide a fever. Women should inform their doctor if they wish to become pregnant or think they might be pregnant. There is a potential for serious side effects to an unborn child. Talk to your health care professional or pharmacist for more information. Do not breast-feed an infant while taking this medicine. What side effects may I notice from receiving this medicine? Side effects that you should report to your doctor or health care professional as soon as possible: -allergic reactions like skin rash, itching or hives, swelling of the face, lips, or tongue -low blood counts - this medicine may decrease the number of white blood cells, red blood cells and platelets. You may be at increased risk for infections and bleeding. -signs  of infection - fever or chills, cough, sore throat, pain or difficulty passing urine -signs of decreased platelets or bleeding - bruising, pinpoint red spots on the skin, black, tarry stools, blood in the urine -signs of decreased red blood cells - unusually weak or tired, fainting spells, lightheadedness -breathing problems -chest pain -mouth sores -nausea and vomiting -pain, swelling, redness at site where injected -pain, tingling, numbness in the hands or  feet -stomach pain -swelling of ankles, feet, hands -unusual bleeding Side effects that usually do not require medical attention (report to your doctor or health care professional if they continue or are bothersome): -constipation -diarrhea -hair loss -loss of appetite -stomach upset This list may not describe all possible side effects. Call your doctor for medical advice about side effects. You may report side effects to FDA at 1-800-FDA-1088. Where should I keep my medicine? This drug is given in a hospital or clinic and will not be stored at home. NOTE: This sheet is a summary. It may not cover all possible information. If you have questions about this medicine, talk to your doctor, pharmacist, or health care provider.  2018 Elsevier/Gold Standard (2008-01-08 18:45:54)

## 2018-04-28 ENCOUNTER — Other Ambulatory Visit: Payer: Self-pay | Admitting: Cardiology

## 2018-04-30 ENCOUNTER — Telehealth: Payer: Self-pay

## 2018-04-30 ENCOUNTER — Telehealth: Payer: Self-pay | Admitting: *Deleted

## 2018-04-30 ENCOUNTER — Telehealth: Payer: Self-pay | Admitting: Oncology

## 2018-04-30 NOTE — Telephone Encounter (Signed)
Spoke to patients wife regarding upcoming aug appt updates per 8/19 sch message

## 2018-04-30 NOTE — Telephone Encounter (Signed)
Called pt to inform that it is ok to move appts per Dr. Benay Spice. Schedule message sen t

## 2018-04-30 NOTE — Telephone Encounter (Signed)
11:05 am  TC from pt's wife. She is asking if Manuel Olson next chemo appt can be moved a week as they would like to go out of town for a few days. She feels like they both need a change of scenery .  His next appt is scheduled for 05/08/18 (Gemzar/Abraxane). She would like it to be the following week. She also states his symptoms of SOB and pain in his side have improved and not problematic at this time.  Please return pt's call regarding appt change.

## 2018-05-02 ENCOUNTER — Telehealth: Payer: Self-pay | Admitting: Oncology

## 2018-05-02 NOTE — Telephone Encounter (Signed)
Mailed patient calendar of upcoming appt updates.

## 2018-05-03 ENCOUNTER — Telehealth: Payer: Self-pay

## 2018-05-03 DIAGNOSIS — C250 Malignant neoplasm of head of pancreas: Secondary | ICD-10-CM

## 2018-05-03 MED ORDER — CHOLESTYRAMINE 4 G PO PACK: 4.0000 g | PACK | Freq: Two times a day (BID) | ORAL | 12 refills | Status: DC

## 2018-05-03 NOTE — Telephone Encounter (Signed)
Spoke with pt regarding diarrhea. Pt states that he has been having 2-4 episodes each day. Pt states it is "not watery. Really loose". Been taking lomotil with no help. Pt confirms that he is eating and drinking ok.  Per Dr. Benay Spice, diarrhea not likely coming from chemotherapy. New prescription sent to pharmacy. LVM with pt regarding medication sent to pharmacy

## 2018-05-07 ENCOUNTER — Other Ambulatory Visit: Payer: Self-pay | Admitting: Cardiology

## 2018-05-08 ENCOUNTER — Ambulatory Visit: Payer: Medicare Other | Admitting: Oncology

## 2018-05-08 ENCOUNTER — Other Ambulatory Visit: Payer: Medicare Other

## 2018-05-08 ENCOUNTER — Ambulatory Visit: Payer: Medicare Other

## 2018-05-11 ENCOUNTER — Ambulatory Visit: Payer: Medicare Other | Admitting: Cardiology

## 2018-05-14 ENCOUNTER — Other Ambulatory Visit: Payer: Self-pay | Admitting: Oncology

## 2018-05-16 ENCOUNTER — Inpatient Hospital Stay: Payer: Medicare Other | Attending: Oncology

## 2018-05-16 ENCOUNTER — Inpatient Hospital Stay: Payer: Medicare Other

## 2018-05-16 ENCOUNTER — Inpatient Hospital Stay (HOSPITAL_BASED_OUTPATIENT_CLINIC_OR_DEPARTMENT_OTHER): Payer: Medicare Other | Admitting: Oncology

## 2018-05-16 ENCOUNTER — Other Ambulatory Visit: Payer: Self-pay

## 2018-05-16 ENCOUNTER — Inpatient Hospital Stay: Payer: Medicare Other | Admitting: Nutrition

## 2018-05-16 VITALS — BP 173/86 | HR 85 | Temp 98.1°F | Resp 18 | Ht 69.0 in | Wt 131.2 lb

## 2018-05-16 DIAGNOSIS — C250 Malignant neoplasm of head of pancreas: Secondary | ICD-10-CM | POA: Insufficient documentation

## 2018-05-16 DIAGNOSIS — E876 Hypokalemia: Secondary | ICD-10-CM | POA: Diagnosis not present

## 2018-05-16 DIAGNOSIS — K8689 Other specified diseases of pancreas: Secondary | ICD-10-CM | POA: Diagnosis not present

## 2018-05-16 DIAGNOSIS — I9589 Other hypotension: Secondary | ICD-10-CM | POA: Diagnosis not present

## 2018-05-16 DIAGNOSIS — R197 Diarrhea, unspecified: Secondary | ICD-10-CM | POA: Diagnosis not present

## 2018-05-16 DIAGNOSIS — E119 Type 2 diabetes mellitus without complications: Secondary | ICD-10-CM | POA: Diagnosis not present

## 2018-05-16 DIAGNOSIS — I10 Essential (primary) hypertension: Secondary | ICD-10-CM

## 2018-05-16 DIAGNOSIS — C642 Malignant neoplasm of left kidney, except renal pelvis: Secondary | ICD-10-CM

## 2018-05-16 DIAGNOSIS — Z95828 Presence of other vascular implants and grafts: Secondary | ICD-10-CM

## 2018-05-16 DIAGNOSIS — Z87891 Personal history of nicotine dependence: Secondary | ICD-10-CM

## 2018-05-16 DIAGNOSIS — Z5111 Encounter for antineoplastic chemotherapy: Secondary | ICD-10-CM | POA: Insufficient documentation

## 2018-05-16 LAB — CBC WITH DIFFERENTIAL (CANCER CENTER ONLY)
Basophils Absolute: 0 10*3/uL (ref 0.0–0.1)
Basophils Relative: 1 %
Eosinophils Absolute: 0.1 10*3/uL (ref 0.0–0.5)
Eosinophils Relative: 2 %
HCT: 30.7 % — ABNORMAL LOW (ref 38.4–49.9)
Hemoglobin: 9.8 g/dL — ABNORMAL LOW (ref 13.0–17.1)
Lymphocytes Relative: 24 %
Lymphs Abs: 1.4 10*3/uL (ref 0.9–3.3)
MCH: 29.3 pg (ref 27.2–33.4)
MCHC: 31.9 g/dL — ABNORMAL LOW (ref 32.0–36.0)
MCV: 91.6 fL (ref 79.3–98.0)
Monocytes Absolute: 1.4 10*3/uL — ABNORMAL HIGH (ref 0.1–0.9)
Monocytes Relative: 23 %
Neutro Abs: 3 10*3/uL (ref 1.5–6.5)
Neutrophils Relative %: 50 %
Platelet Count: 234 10*3/uL (ref 140–400)
RBC: 3.35 MIL/uL — ABNORMAL LOW (ref 4.20–5.82)
RDW: 15.1 % — ABNORMAL HIGH (ref 11.0–14.6)
WBC Count: 5.9 10*3/uL (ref 4.0–10.3)

## 2018-05-16 LAB — CMP (CANCER CENTER ONLY)
ALT: 19 U/L (ref 0–44)
AST: 24 U/L (ref 15–41)
Albumin: 3 g/dL — ABNORMAL LOW (ref 3.5–5.0)
Alkaline Phosphatase: 133 U/L — ABNORMAL HIGH (ref 38–126)
Anion gap: 9 (ref 5–15)
BUN: 12 mg/dL (ref 8–23)
CO2: 27 mmol/L (ref 22–32)
Calcium: 8.5 mg/dL — ABNORMAL LOW (ref 8.9–10.3)
Chloride: 104 mmol/L (ref 98–111)
Creatinine: 1.36 mg/dL — ABNORMAL HIGH (ref 0.61–1.24)
GFR, Est AFR Am: 58 mL/min — ABNORMAL LOW (ref >60)
GFR, Estimated: 50 mL/min — ABNORMAL LOW (ref >60)
Glucose, Bld: 138 mg/dL — ABNORMAL HIGH (ref 70–99)
Potassium: 2.8 mmol/L — CL (ref 3.5–5.1)
Sodium: 140 mmol/L (ref 135–145)
Total Bilirubin: 0.4 mg/dL (ref 0.3–1.2)
Total Protein: 6.1 g/dL — ABNORMAL LOW (ref 6.5–8.1)

## 2018-05-16 MED ORDER — PACLITAXEL PROTEIN-BOUND CHEMO INJECTION 100 MG
100.0000 mg/m2 | Freq: Once | INTRAVENOUS | Status: AC
  Administered 2018-05-16: 175 mg via INTRAVENOUS
  Filled 2018-05-16: qty 35

## 2018-05-16 MED ORDER — PROCHLORPERAZINE MALEATE 10 MG PO TABS
10.0000 mg | ORAL_TABLET | Freq: Once | ORAL | Status: AC
  Administered 2018-05-16: 10 mg via ORAL

## 2018-05-16 MED ORDER — PANCRELIPASE (LIP-PROT-AMYL) 36000-114000 UNITS PO CPEP: ORAL_CAPSULE | ORAL | 0 refills | Status: DC

## 2018-05-16 MED ORDER — POTASSIUM CHLORIDE CRYS ER 20 MEQ PO TBCR
40.0000 meq | EXTENDED_RELEASE_TABLET | Freq: Once | ORAL | Status: AC
  Administered 2018-05-16: 40 meq via ORAL

## 2018-05-16 MED ORDER — POTASSIUM CHLORIDE ER 10 MEQ PO CPCR: 10.0000 meq | ORAL_CAPSULE | Freq: Two times a day (BID) | ORAL | 0 refills | Status: DC

## 2018-05-16 MED ORDER — SODIUM CHLORIDE 0.9% FLUSH
10.0000 mL | Freq: Once | INTRAVENOUS | Status: AC
  Administered 2018-05-16: 10 mL
  Filled 2018-05-16: qty 10

## 2018-05-16 MED ORDER — SODIUM CHLORIDE 0.9 % IV SOLN
800.0000 mg/m2 | Freq: Once | INTRAVENOUS | Status: AC
  Administered 2018-05-16: 1368 mg via INTRAVENOUS
  Filled 2018-05-16: qty 35.98

## 2018-05-16 MED ORDER — SODIUM CHLORIDE 0.9% FLUSH
10.0000 mL | INTRAVENOUS | Status: DC | PRN
  Administered 2018-05-16: 10 mL
  Filled 2018-05-16: qty 10

## 2018-05-16 MED ORDER — PROCHLORPERAZINE MALEATE 10 MG PO TABS
ORAL_TABLET | ORAL | Status: AC
  Filled 2018-05-16: qty 1

## 2018-05-16 MED ORDER — SODIUM CHLORIDE 0.9 % IV SOLN
Freq: Once | INTRAVENOUS | Status: AC
  Administered 2018-05-16: 14:00:00 via INTRAVENOUS
  Filled 2018-05-16: qty 250

## 2018-05-16 MED ORDER — HEPARIN SOD (PORK) LOCK FLUSH 100 UNIT/ML IV SOLN
500.0000 [IU] | Freq: Once | INTRAVENOUS | Status: AC | PRN
  Administered 2018-05-16: 500 [IU]
  Filled 2018-05-16: qty 5

## 2018-05-16 MED ORDER — POTASSIUM CHLORIDE CRYS ER 20 MEQ PO TBCR
EXTENDED_RELEASE_TABLET | ORAL | Status: AC
  Filled 2018-05-16: qty 2

## 2018-05-16 MED ORDER — DIPHENOXYLATE-ATROPINE 2.5-0.025 MG PO TABS: ORAL_TABLET | ORAL | 1 refills | Status: DC

## 2018-05-16 NOTE — Addendum Note (Signed)
Addended by: Mathis Fare on: 05/16/2018 06:15 PM   Modules accepted: Orders

## 2018-05-16 NOTE — Progress Notes (Signed)
Nutrition follow-up scheduled with patient being treated for pancreas cancer status post Whipple procedure February 19, 2018. Patient has had episodes of decreased oral intake and dehydration. Current weight documented as 130.6 pounds. Patient reports constant loose stools especially after eating. Patient describes stools as being soft, oily, and floating on top of the water. He has been taking Lomotil without much relief. It appears patient has been eating a diet high in fiber. He recently had an increase in his appetite after starting Megace. Severe malnutrition in the context of chronic illness continues.  Nutrition diagnosis: Food and nutrition related knowledge deficit continues.  Intervention: I educated patient on the importance of following a low fiber diet.  Provided fact sheet. Educated patient on foods to include to help with diarrhea. Recommended he continue all frequent meals and snacks with high-protein foods. Discouraged patient from eating high-fat foods. Suspect stearrhea secondary to greasy, oily stools.  Recommended low-fat diet.  Consider pancreatic enzymes. Questions were answered.  Teach back method used.  Monitoring, evaluation, goals: Patient will continue adequate calorie and protein intake. He will try a low fiber, low fat diet.  Next visit: Wednesday, October 2 during infusion.  **Disclaimer: This note was dictated with voice recognition software. Similar sounding words can inadvertently be transcribed and this note may contain transcription errors which may not have been corrected upon publication of note.**

## 2018-05-16 NOTE — Progress Notes (Signed)
Dr. Benay Spice ok to tx with K 2.8. Pt to receive 77m PO during appt today. Supplemental PO K for home use. Pt aware.

## 2018-05-16 NOTE — Patient Instructions (Signed)
[facility] Discharge Instructions for Patients Receiving Chemotherapy  Today you received the following chemotherapy agents Abraxane, Gemzar  To help prevent nausea and vomiting after your treatment, we encourage you to take your nausea medication as prescribed.   If you develop nausea and vomiting that is not controlled by your nausea medication, call the clinic.   BELOW ARE SYMPTOMS THAT SHOULD BE REPORTED IMMEDIATELY:  *FEVER GREATER THAN 100.5 F  *CHILLS WITH OR WITHOUT FEVER  NAUSEA AND VOMITING THAT IS NOT CONTROLLED WITH YOUR NAUSEA MEDICATION  *UNUSUAL SHORTNESS OF BREATH  *UNUSUAL BRUISING OR BLEEDING  TENDERNESS IN MOUTH AND THROAT WITH OR WITHOUT PRESENCE OF ULCERS  *URINARY PROBLEMS  *BOWEL PROBLEMS  UNUSUAL RASH Items with * indicate a potential emergency and should be followed up as soon as possible.  Feel free to call the clinic should you have any questions or concerns. The clinic phone number is [phone].  Please show the [facility] at check-in to the Emergency Department and triage nurse.  Nanoparticle Albumin-Bound Paclitaxel injection What is this medicine? NANOPARTICLE ALBUMIN-BOUND PACLITAXEL (Na no PAHR ti kuhl al BYOO muhn-bound PAK li TAX el) is a chemotherapy drug. It targets fast dividing cells, like cancer cells, and causes these cells to die. This medicine is used to treat advanced breast cancer and advanced lung cancer. This medicine may be used for other purposes; ask your health care provider or pharmacist if you have questions. COMMON BRAND NAME(S): Abraxane What should I tell my health care provider before I take this medicine? They need to know if you have any of these conditions: -kidney disease -liver disease -low blood counts, like low platelets, red blood cells, or white blood cells -recent or ongoing radiation therapy -an unusual or allergic reaction to paclitaxel, albumin, other chemotherapy, other  medicines, foods, dyes, or preservatives -pregnant or trying to get pregnant -breast-feeding How should I use this medicine? This drug is given as an infusion into a vein. It is administered in a hospital or clinic by a specially trained health care professional. Talk to your pediatrician regarding the use of this medicine in children. Special care may be needed. Overdosage: If you think you have taken too much of this medicine contact a poison control center or emergency room at once. NOTE: This medicine is only for you. Do not share this medicine with others. What if I miss a dose? It is important not to miss your dose. Call your doctor or health care professional if you are unable to keep an appointment. What may interact with this medicine? -cyclosporine -diazepam -ketoconazole -medicines to increase blood counts like filgrastim, pegfilgrastim, sargramostim -other chemotherapy drugs like cisplatin, doxorubicin, epirubicin, etoposide, teniposide, vincristine -quinidine -testosterone -vaccines -verapamil Talk to your doctor or health care professional before taking any of these medicines: -acetaminophen -aspirin -ibuprofen -ketoprofen -naproxen This list may not describe all possible interactions. Give your health care provider a list of all the medicines, herbs, non-prescription drugs, or dietary supplements you use. Also tell them if you smoke, drink alcohol, or use illegal drugs. Some items may interact with your medicine. What should I watch for while using this medicine? Your condition will be monitored carefully while you are receiving this medicine. You will need important blood work done while you are taking this medicine. This medicine can cause serious allergic reactions. If you experience allergic reactions like skin rash, itching or hives, swelling of the face, lips, or tongue, tell your doctor or health care  professional right away. In some cases, you may be given  additional medicines to help with side effects. Follow all directions for their use. This drug may make you feel generally unwell. This is not uncommon, as chemotherapy can affect healthy cells as well as cancer cells. Report any side effects. Continue your course of treatment even though you feel ill unless your doctor tells you to stop. Call your doctor or health care professional for advice if you get a fever, chills or sore throat, or other symptoms of a cold or flu. Do not treat yourself. This drug decreases your body's ability to fight infections. Try to avoid being around people who are sick. This medicine may increase your risk to bruise or bleed. Call your doctor or health care professional if you notice any unusual bleeding. Be careful brushing and flossing your teeth or using a toothpick because you may get an infection or bleed more easily. If you have any dental work done, tell your dentist you are receiving this medicine. Avoid taking products that contain aspirin, acetaminophen, ibuprofen, naproxen, or ketoprofen unless instructed by your doctor. These medicines may hide a fever. Do not become pregnant while taking this medicine. Women should inform their doctor if they wish to become pregnant or think they might be pregnant. There is a potential for serious side effects to an unborn child. Talk to your health care professional or pharmacist for more information. Do not breast-feed an infant while taking this medicine. Men are advised not to father a child while receiving this medicine. What side effects may I notice from receiving this medicine? Side effects that you should report to your doctor or health care professional as soon as possible: -allergic reactions like skin rash, itching or hives, swelling of the face, lips, or tongue -low blood counts - This drug may decrease the number of white blood cells, red blood cells and platelets. You may be at increased risk for infections and  bleeding. -signs of infection - fever or chills, cough, sore throat, pain or difficulty passing urine -signs of decreased platelets or bleeding - bruising, pinpoint red spots on the skin, black, tarry stools, nosebleeds -signs of decreased red blood cells - unusually weak or tired, fainting spells, lightheadedness -breathing problems -changes in vision -chest pain -high or low blood pressure -mouth sores -nausea and vomiting -pain, swelling, redness or irritation at the injection site -pain, tingling, numbness in the hands or feet -slow or irregular heartbeat -swelling of the ankle, feet, hands Side effects that usually do not require medical attention (report to your doctor or health care professional if they continue or are bothersome): -aches, pains -changes in the color of fingernails -diarrhea -hair loss -loss of appetite This list may not describe all possible side effects. Call your doctor for medical advice about side effects. You may report side effects to FDA at 1-800-FDA-1088. Where should I keep my medicine? This drug is given in a hospital or clinic and will not be stored at home. NOTE: This sheet is a summary. It may not cover all possible information. If you have questions about this medicine, talk to your doctor, pharmacist, or health care provider.  2018 Elsevier/Gold Standard (2015-07-01 10:05:20)

## 2018-05-16 NOTE — Progress Notes (Signed)
[facility] OFFICE PROGRESS NOTE   Diagnosis: Pancreas cancer  INTERVAL HISTORY:   [facility] as scheduled.  He completed treatment with gemcitabine/Abraxane on 04/25/2018.  No fever, rash, or neuropathy symptoms.  He reports a good appetite.  He continues to have orthostasis symptoms, but this has improved.  He is using support stockings. He complains of diarrhea 2-3 times per day.  Lomotil has not helped.  He has noticed floating of the stool in the toilet. He had an episode of sharp pain in the right lower lateral abdomen lasting for a few days.  This has resolved.  Objective:  Vital signs in last 24 hours:  Blood pressure (!) 173/86, pulse 85, temperature 98.1 F (36.7 C), temperature source Oral, resp. rate 18, height 5' 9"  (1.753 m), weight 131 lb 3.2 oz (59.5 kg), SpO2 100 %.    HEENT: No thrush or ulcers Resp: Lungs clear bilaterally Cardio: Regular rate and rhythm GI: No hepatomegaly, no mass, nontender Vascular: No leg edema    Portacath/PICC-without erythema  Lab Results:  Lab Results  Component Value Date   WBC 5.9 05/16/2018   HGB 9.8 (L) 05/16/2018   HCT 30.7 (L) 05/16/2018   MCV 91.6 05/16/2018   PLT 234 05/16/2018   NEUTROABS 3.0 05/16/2018    CMP  Lab Results  Component Value Date   NA 140 05/16/2018   K 2.8 (LL) 05/16/2018   CL 104 05/16/2018   CO2 27 05/16/2018   GLUCOSE 138 (H) 05/16/2018   BUN 12 05/16/2018   CREATININE 1.36 (H) 05/16/2018   CALCIUM 8.5 (L) 05/16/2018   PROT 6.1 (L) 05/16/2018   ALBUMIN 3.0 (L) 05/16/2018   AST 24 05/16/2018   ALT 19 05/16/2018   ALKPHOS 133 (H) 05/16/2018   BILITOT 0.4 05/16/2018   GFRNONAA 50 (L) 05/16/2018   GFRAA 58 (L) 05/16/2018     Medications: I have reviewed the patient's current medications.   Assessment/Plan: 1. Adenocarcinoma of the pancreas head/neck, status post an EUS biopsy 08/02/2017 confirming adenocarcinoma ? EUS 08/02/2017-pancreas head/neck mass,  abutment of the portal vein, no peripancreatic adenopathy ? MRI abdomen 07/27/2017-head/neck pancreas mass with mass-effect on the portal splenic confluence, no involvement of the celiac axis or superior mesenteric artery, no evidence of metastatic disease ? PET scan 08/31/2017-very hypermetabolism at the pancreatic head mass, no evidence of metastatic disease. The isolated right lower lobe nodule below PET resolution, left sided renal mass with decreased activity relative to the normal adjacent kidney ? Cycle 1 FOLFIRINOX 09/16/2017 ? Cycle 2 FOLFOX 10/16/2017 ? Cycle 3 FOLFOX 10/30/2017 ? Cycle 4 FOLFIRINOX 11/14/2017 (irinotecan resumed with a dose reduction) ? Cycle 5 FOLFIRINOX3/20/2019 (Oxaliplatin dose reduced due to thrombocytopenia) ? Cycle 6 FOLFIRINOX4/11/2017 ? CTs 12/25/2017-stable right lower lobe nodule, decrease in size of the pancreas head mass, stable left kidney mass ? Pancreaticoduodenectomy and left nephrectomy 02/19/2018,pT1cpN1, 2/18 lymph nodes positive, lymphovascular and perineural invasion present, mild to moderate treatment effect, tumor involved adherent portal vein with negative portal vein margins ? Cycle 1 adjuvant gemcitabine/Abraxane 04/25/2018 ? Cycle 2 adjuvant gemcitabine/Abraxane 05/16/2018  2. 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 Barry Dienes  9.  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 Dehydrationon 03/19/2018-statustreatments with IV fluidsforpast 2  weeks  Admission 04/03/2018 with dehydration and hypotension  Persistent orthostatic hypotension- compression stockings and Florinef added 04/10/2018  Improved     Disposition: His overall status appears unchanged.  The orthostasis has improved.  He has hypokalemia secondary to diarrhea.  He will receive potassium at the Cancer center today and then begin a potassium supplement. The diarrhea may be related to pancreas insufficiency.  He will begin a trial of pancreatic enzyme replacement.  He will contact us if the diarrhea does not improve over the next few days.  Manuel Olson will complete another cycle of gemcitabine/Abraxane today.  He will return for an office visit and chemotherapy in 2 weeks.  25 minutes were spent with the patient today.  The majority of the time was used for counseling and coordination of care.  [name], MD  05/16/2018  12:52 PM

## 2018-05-17 ENCOUNTER — Other Ambulatory Visit: Payer: Self-pay

## 2018-05-17 MED ORDER — PANCRELIPASE (LIP-PROT-AMYL) 36000-114000 UNITS PO CPEP: ORAL_CAPSULE | ORAL | 0 refills | Status: DC

## 2018-05-18 ENCOUNTER — Telehealth: Payer: Self-pay

## 2018-05-18 NOTE — Telephone Encounter (Signed)
Printed avs and calender of upcoming appointment. Per 9/4 los

## 2018-05-27 ENCOUNTER — Other Ambulatory Visit: Payer: Self-pay | Admitting: Oncology

## 2018-05-30 ENCOUNTER — Inpatient Hospital Stay (HOSPITAL_BASED_OUTPATIENT_CLINIC_OR_DEPARTMENT_OTHER): Payer: Medicare Other | Admitting: Oncology

## 2018-05-30 ENCOUNTER — Telehealth: Payer: Self-pay | Admitting: Oncology

## 2018-05-30 ENCOUNTER — Inpatient Hospital Stay: Payer: Medicare Other

## 2018-05-30 ENCOUNTER — Telehealth: Payer: Self-pay | Admitting: Cardiology

## 2018-05-30 ENCOUNTER — Other Ambulatory Visit: Payer: Medicare Other

## 2018-05-30 VITALS — BP 158/91 | HR 87 | Temp 97.6°F | Resp 18 | Ht 69.0 in | Wt 131.6 lb

## 2018-05-30 DIAGNOSIS — C250 Malignant neoplasm of head of pancreas: Secondary | ICD-10-CM

## 2018-05-30 DIAGNOSIS — I9589 Other hypotension: Secondary | ICD-10-CM

## 2018-05-30 DIAGNOSIS — Z5111 Encounter for antineoplastic chemotherapy: Secondary | ICD-10-CM | POA: Diagnosis not present

## 2018-05-30 DIAGNOSIS — Z95828 Presence of other vascular implants and grafts: Secondary | ICD-10-CM

## 2018-05-30 DIAGNOSIS — E119 Type 2 diabetes mellitus without complications: Secondary | ICD-10-CM | POA: Diagnosis not present

## 2018-05-30 DIAGNOSIS — C642 Malignant neoplasm of left kidney, except renal pelvis: Secondary | ICD-10-CM

## 2018-05-30 DIAGNOSIS — K8689 Other specified diseases of pancreas: Secondary | ICD-10-CM

## 2018-05-30 DIAGNOSIS — I10 Essential (primary) hypertension: Secondary | ICD-10-CM

## 2018-05-30 DIAGNOSIS — Z87891 Personal history of nicotine dependence: Secondary | ICD-10-CM

## 2018-05-30 LAB — CMP (CANCER CENTER ONLY)
ALT: 42 U/L (ref 0–44)
AST: 47 U/L — ABNORMAL HIGH (ref 15–41)
Albumin: 3 g/dL — ABNORMAL LOW (ref 3.5–5.0)
Alkaline Phosphatase: 138 U/L — ABNORMAL HIGH (ref 38–126)
Anion gap: 7 (ref 5–15)
BUN: 19 mg/dL (ref 8–23)
CO2: 24 mmol/L (ref 22–32)
Calcium: 9 mg/dL (ref 8.9–10.3)
Chloride: 107 mmol/L (ref 98–111)
Creatinine: 1.27 mg/dL — ABNORMAL HIGH (ref 0.61–1.24)
GFR, Est AFR Am: >60 mL/min (ref >60)
GFR, Estimated: 54 mL/min — ABNORMAL LOW (ref >60)
Glucose, Bld: 220 mg/dL — ABNORMAL HIGH (ref 70–99)
Potassium: 3.7 mmol/L (ref 3.5–5.1)
Sodium: 138 mmol/L (ref 135–145)
Total Bilirubin: 0.4 mg/dL (ref 0.3–1.2)
Total Protein: 6.2 g/dL — ABNORMAL LOW (ref 6.5–8.1)

## 2018-05-30 LAB — CBC WITH DIFFERENTIAL (CANCER CENTER ONLY)
Basophils Absolute: 0 10*3/uL (ref 0.0–0.1)
Basophils Relative: 1 %
Eosinophils Absolute: 0.1 10*3/uL (ref 0.0–0.5)
Eosinophils Relative: 2 %
HCT: 27.5 % — ABNORMAL LOW (ref 38.4–49.9)
Hemoglobin: 9 g/dL — ABNORMAL LOW (ref 13.0–17.1)
Lymphocytes Relative: 19 %
Lymphs Abs: 1.1 10*3/uL (ref 0.9–3.3)
MCH: 30.1 pg (ref 27.2–33.4)
MCHC: 32.8 g/dL (ref 32.0–36.0)
MCV: 91.8 fL (ref 79.3–98.0)
Monocytes Absolute: 0.7 10*3/uL (ref 0.1–0.9)
Monocytes Relative: 14 %
Neutro Abs: 3.6 10*3/uL (ref 1.5–6.5)
Neutrophils Relative %: 64 %
Platelet Count: 185 10*3/uL (ref 140–400)
RBC: 3 MIL/uL — ABNORMAL LOW (ref 4.20–5.82)
RDW: 16.7 % — ABNORMAL HIGH (ref 11.0–14.6)
WBC Count: 5.6 10*3/uL (ref 4.0–10.3)

## 2018-05-30 LAB — MAGNESIUM: Magnesium: 1.5 mg/dL — ABNORMAL LOW (ref 1.7–2.4)

## 2018-05-30 MED ORDER — HEPARIN SOD (PORK) LOCK FLUSH 100 UNIT/ML IV SOLN
500.0000 [IU] | Freq: Once | INTRAVENOUS | Status: AC
  Administered 2018-05-30: 500 [IU]
  Filled 2018-05-30: qty 5

## 2018-05-30 MED ORDER — SODIUM CHLORIDE 0.9% FLUSH
10.0000 mL | Freq: Once | INTRAVENOUS | Status: AC
  Administered 2018-05-30: 10 mL
  Filled 2018-05-30: qty 10

## 2018-05-30 MED ORDER — LISINOPRIL 5 MG PO TABS: 5.0000 mg | ORAL_TABLET | Freq: Every day | ORAL | 3 refills | Status: DC

## 2018-05-30 NOTE — Telephone Encounter (Signed)
Patient and wife informed and verbalized understanding of plan.

## 2018-05-30 NOTE — Progress Notes (Signed)
[facility] OFFICE PROGRESS NOTE   Diagnosis: Pancreas cancer  INTERVAL HISTORY:   Manuel Olson returns for a scheduled visit.  He completed another cycle of gemcitabine/Abraxane 05/16/2018.  No fever.  Pancreatic enzyme replacement has helped the frequent bowel movements.  He reports a good appetite.  No syncope events.  He feels well.  Objective:  Vital signs in last 24 hours:  Blood pressure (!) 158/91, pulse 87, temperature 97.6 F (36.4 C), temperature source Oral, resp. rate 18, height 5' 9"  (1.753 m), weight 131 lb 9.6 oz (59.7 kg), SpO2 100 %.    HEENT: No thrush or ulcers Resp: Lungs clear bilaterally Cardio: Regular rate and rhythm GI: No hepatomegaly, no mass, nontender Vascular: No leg edema   Portacath/PICC-without erythema  Lab Results:  Lab Results  Component Value Date   WBC 5.6 05/30/2018   HGB 9.0 (L) 05/30/2018   HCT 27.5 (L) 05/30/2018   MCV 91.8 05/30/2018   PLT 185 05/30/2018   NEUTROABS 3.6 05/30/2018    CMP  Lab Results  Component Value Date   NA 138 05/30/2018   K 3.7 05/30/2018   CL 107 05/30/2018   CO2 24 05/30/2018   GLUCOSE 220 (H) 05/30/2018   BUN 19 05/30/2018   CREATININE 1.27 (H) 05/30/2018   CALCIUM 9.0 05/30/2018   PROT 6.2 (L) 05/30/2018   ALBUMIN 3.0 (L) 05/30/2018   AST 47 (H) 05/30/2018   ALT 42 05/30/2018   ALKPHOS 138 (H) 05/30/2018   BILITOT 0.4 05/30/2018   GFRNONAA 54 (L) 05/30/2018   GFRAA >60 05/30/2018    No results found for: CEA1   Medications: I have reviewed the patient's current medications.   Assessment/Plan: 1. Adenocarcinoma of the pancreas head/neck, status post an EUS biopsy 08/02/2017 confirming adenocarcinoma ? EUS 08/02/2017-pancreas head/neck mass, abutment of the portal vein, no peripancreatic adenopathy ? MRI abdomen 07/27/2017-head/neck pancreas mass with mass-effect on the portal splenic confluence, no involvement of the celiac axis or superior mesenteric artery, no  evidence of metastatic disease ? PET scan 08/31/2017-very hypermetabolism at the pancreatic head mass, no evidence of metastatic disease. The isolated right lower lobe nodule below PET resolution, left sided renal mass with decreased activity relative to the normal adjacent kidney ? Cycle 1 FOLFIRINOX 09/16/2017 ? Cycle 2 FOLFOX 10/16/2017 ? Cycle 3 FOLFOX 10/30/2017 ? Cycle 4 FOLFIRINOX 11/14/2017 (irinotecan resumed with a dose reduction) ? Cycle 5 FOLFIRINOX3/20/2019 (Oxaliplatin dose reduced due to thrombocytopenia) ? Cycle 6 FOLFIRINOX4/11/2017 ? CTs 12/25/2017-stable right lower lobe nodule, decrease in size of the pancreas head mass, stable left kidney mass ? Pancreaticoduodenectomy and left nephrectomy 02/19/2018,pT1cpN1, 2/18 lymph nodes positive, lymphovascular and perineural invasion present, mild to moderate treatment effect, tumor involved adherent portal vein with negative portal vein margins ? Cycle 1 adjuvant gemcitabine/Abraxane 04/25/2018 ? Cycle 2 adjuvant gemcitabine/Abraxane 05/16/2018 ? Cycle 3 adjuvant gemcitabine/Abraxane 05/31/2018  2. 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 Barry Dienes  9. 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 Dehydrationon 03/19/2018-statustreatments with IV fluidsforpast 2 weeks  Admission 04/03/2018 with dehydration and hypotension  Persistent orthostatic hypotension-compression stockings and Florinef  added 04/10/2018  Improved       Disposition: Mr.  Tuohy appears well.  His performance status continues to improve.  He will complete another cycle of gemcitabine/Abraxane on 05/31/2018.  He will return for an office visit and chemotherapy in 2 weeks.  He declined an influenza vaccine.  15 minutes were spent with the patient today.  The majority of the time was used for counseling and coordination of care.  [name], MD  05/30/2018  1:03 PM

## 2018-05-30 NOTE — Patient Instructions (Signed)
Implanted Port Home Guide An implanted port is a type of central line that is placed under the skin. Central lines are used to provide IV access when treatment or nutrition needs to be given through a person's veins. Implanted ports are used for long-term IV access. An implanted port may be placed because:  You need IV medicine that would be irritating to the small veins in your hands or arms.  You need long-term IV medicines, such as antibiotics.  You need IV nutrition for a long period.  You need frequent blood draws for lab tests.  You need dialysis.  Implanted ports are usually placed in the chest area, but they can also be placed in the upper arm, the abdomen, or the leg. An implanted port has two main parts:  Reservoir. The reservoir is round and will appear as a small, raised area under your skin. The reservoir is the part where a needle is inserted to give medicines or draw blood.  Catheter. The catheter is a thin, flexible tube that extends from the reservoir. The catheter is placed into a large vein. Medicine that is inserted into the reservoir goes into the catheter and then into the vein.  How will I care for my incision site? Do not get the incision site wet. Bathe or shower as directed by your health care provider. How is my port accessed? Special steps must be taken to access the port:  Before the port is accessed, a numbing cream can be placed on the skin. This helps numb the skin over the port site.  Your health care provider uses a sterile technique to access the port. ? Your health care provider must put on a mask and sterile gloves. ? The skin over your port is cleaned carefully with an antiseptic and allowed to dry. ? The port is gently pinched between sterile gloves, and a needle is inserted into the port.  Only "non-coring" port needles should be used to access the port. Once the port is accessed, a blood return should be checked. This helps ensure that the port  is in the vein and is not clogged.  If your port needs to remain accessed for a constant infusion, a clear (transparent) bandage will be placed over the needle site. The bandage and needle will need to be changed every week, or as directed by your health care provider.  Keep the bandage covering the needle clean and dry. Do not get it wet. Follow your health care provider's instructions on how to take a shower or bath while the port is accessed.  If your port does not need to stay accessed, no bandage is needed over the port.  What is flushing? Flushing helps keep the port from getting clogged. Follow your health care provider's instructions on how and when to flush the port. Ports are usually flushed with saline solution or a medicine called heparin. The need for flushing will depend on how the port is used.  If the port is used for intermittent medicines or blood draws, the port will need to be flushed: ? After medicines have been given. ? After blood has been drawn. ? As part of routine maintenance.  If a constant infusion is running, the port may not need to be flushed.  How long will my port stay implanted? The port can stay in for as long as your health care provider thinks it is needed. When it is time for the port to come out, surgery will be   done to remove it. The procedure is similar to the one performed when the port was put in. When should I seek immediate medical care? When you have an implanted port, you should seek immediate medical care if:  You notice a bad smell coming from the incision site.  You have swelling, redness, or drainage at the incision site.  You have more swelling or pain at the port site or the surrounding area.  You have a fever that is not controlled with medicine.  This information is not intended to replace advice given to you by your health care provider. Make sure you discuss any questions you have with your health care provider. Document  Released: 08/29/2005 Document Revised: 02/04/2016 Document Reviewed: 05/06/2013 Elsevier Interactive Patient Education  2017 [employer].  

## 2018-05-30 NOTE — Addendum Note (Signed)
Addended by: Merlene Laughter on: 05/30/2018 04:57 PM   Modules accepted: Orders

## 2018-05-30 NOTE — Addendum Note (Signed)
Addended by: Johann Capers on: 05/30/2018 11:10 AM   Modules accepted: Orders

## 2018-05-30 NOTE — Telephone Encounter (Signed)
Appt scheduled avs/calendar printed per 9/18 los

## 2018-05-30 NOTE — Telephone Encounter (Signed)
Reviewed provided outpatient blood pressure/orthostatic checks.  I would suggest continuing Florinef for now but resume lisinopril 5 mg daily.  He is having serial lab work checked through the oncology clinic and can have follow-up on creatinine in the next few weeks.

## 2018-05-31 ENCOUNTER — Telehealth: Payer: Self-pay | Admitting: Emergency Medicine

## 2018-05-31 ENCOUNTER — Inpatient Hospital Stay: Payer: Medicare Other

## 2018-05-31 VITALS — BP 189/103 | HR 90 | Temp 98.0°F | Resp 16

## 2018-05-31 DIAGNOSIS — K8689 Other specified diseases of pancreas: Secondary | ICD-10-CM | POA: Diagnosis not present

## 2018-05-31 DIAGNOSIS — C250 Malignant neoplasm of head of pancreas: Secondary | ICD-10-CM | POA: Diagnosis not present

## 2018-05-31 DIAGNOSIS — Z5111 Encounter for antineoplastic chemotherapy: Secondary | ICD-10-CM | POA: Diagnosis not present

## 2018-05-31 DIAGNOSIS — Z95828 Presence of other vascular implants and grafts: Secondary | ICD-10-CM

## 2018-05-31 DIAGNOSIS — C642 Malignant neoplasm of left kidney, except renal pelvis: Secondary | ICD-10-CM | POA: Diagnosis not present

## 2018-05-31 DIAGNOSIS — E119 Type 2 diabetes mellitus without complications: Secondary | ICD-10-CM | POA: Diagnosis not present

## 2018-05-31 DIAGNOSIS — I9589 Other hypotension: Secondary | ICD-10-CM | POA: Diagnosis not present

## 2018-05-31 MED ORDER — PACLITAXEL PROTEIN-BOUND CHEMO INJECTION 100 MG: 100.0000 mg/m2 | Freq: Once | Status: DC

## 2018-05-31 MED ORDER — PROCHLORPERAZINE MALEATE 10 MG PO TABS
10.0000 mg | ORAL_TABLET | Freq: Once | ORAL | Status: AC
  Administered 2018-05-31: 10 mg via ORAL

## 2018-05-31 MED ORDER — PROCHLORPERAZINE MALEATE 10 MG PO TABS
ORAL_TABLET | ORAL | Status: AC
  Filled 2018-05-31: qty 1

## 2018-05-31 MED ORDER — PACLITAXEL PROTEIN-BOUND CHEMO INJECTION 100 MG
100.0000 mg/m2 | Freq: Once | INTRAVENOUS | Status: AC
  Administered 2018-05-31: 175 mg via INTRAVENOUS
  Filled 2018-05-31: qty 35

## 2018-05-31 MED ORDER — SODIUM CHLORIDE 0.9 % IV SOLN
Freq: Once | INTRAVENOUS | Status: AC
  Administered 2018-05-31: 12:00:00 via INTRAVENOUS
  Filled 2018-05-31: qty 250

## 2018-05-31 MED ORDER — SODIUM CHLORIDE 0.9% FLUSH
10.0000 mL | Freq: Once | INTRAVENOUS | Status: AC
  Administered 2018-05-31: 10 mL
  Filled 2018-05-31: qty 10

## 2018-05-31 MED ORDER — HEPARIN SOD (PORK) LOCK FLUSH 100 UNIT/ML IV SOLN
500.0000 [IU] | Freq: Once | INTRAVENOUS | Status: AC
  Administered 2018-05-31: 500 [IU]
  Filled 2018-05-31: qty 5

## 2018-05-31 MED ORDER — SODIUM CHLORIDE 0.9 % IV SOLN
800.0000 mg/m2 | Freq: Once | INTRAVENOUS | Status: AC
  Administered 2018-05-31: 1368 mg via INTRAVENOUS
  Filled 2018-05-31: qty 35.98

## 2018-05-31 NOTE — Patient Instructions (Signed)
[place] Discharge Instructions for Patients Receiving Chemotherapy  Today you received the following chemotherapy agents Abraxane, Gemzar  To help prevent nausea and vomiting after your treatment, we encourage you to take your nausea medication as prescribed.   If you develop nausea and vomiting that is not controlled by your nausea medication, call the clinic.   BELOW ARE SYMPTOMS THAT SHOULD BE REPORTED IMMEDIATELY:  *FEVER GREATER THAN 100.5 F  *CHILLS WITH OR WITHOUT FEVER  NAUSEA AND VOMITING THAT IS NOT CONTROLLED WITH YOUR NAUSEA MEDICATION  *UNUSUAL SHORTNESS OF BREATH  *UNUSUAL BRUISING OR BLEEDING  TENDERNESS IN MOUTH AND THROAT WITH OR WITHOUT PRESENCE OF ULCERS  *URINARY PROBLEMS  *BOWEL PROBLEMS  UNUSUAL RASH Items with * indicate a potential emergency and should be followed up as soon as possible.  Feel free to call the clinic should you have any questions or concerns. The clinic phone number is [phone].  Please show the [facility] at check-in to the Emergency Department and triage nurse.  Nanoparticle Albumin-Bound Paclitaxel injection What is this medicine? NANOPARTICLE ALBUMIN-BOUND PACLITAXEL (Na no PAHR ti kuhl al BYOO muhn-bound PAK li TAX el) is a chemotherapy drug. It targets fast dividing cells, like cancer cells, and causes these cells to die. This medicine is used to treat advanced breast cancer and advanced lung cancer. This medicine may be used for other purposes; ask your health care provider or pharmacist if you have questions. COMMON BRAND NAME(S): Abraxane What should I tell my health care provider before I take this medicine? They need to know if you have any of these conditions: -kidney disease -liver disease -low blood counts, like low platelets, red blood cells, or white blood cells -recent or ongoing radiation therapy -an unusual or allergic reaction to paclitaxel, albumin, other chemotherapy, other  medicines, foods, dyes, or preservatives -pregnant or trying to get pregnant -breast-feeding How should I use this medicine? This drug is given as an infusion into a vein. It is administered in a hospital or clinic by a specially trained health care professional. Talk to your pediatrician regarding the use of this medicine in children. Special care may be needed. Overdosage: If you think you have taken too much of this medicine contact a poison control center or emergency room at once. NOTE: This medicine is only for you. Do not share this medicine with others. What if I miss a dose? It is important not to miss your dose. Call your doctor or health care professional if you are unable to keep an appointment. What may interact with this medicine? -cyclosporine -diazepam -ketoconazole -medicines to increase blood counts like filgrastim, pegfilgrastim, sargramostim -other chemotherapy drugs like cisplatin, doxorubicin, epirubicin, etoposide, teniposide, vincristine -quinidine -testosterone -vaccines -verapamil Talk to your doctor or health care professional before taking any of these medicines: -acetaminophen -aspirin -ibuprofen -ketoprofen -naproxen This list may not describe all possible interactions. Give your health care provider a list of all the medicines, herbs, non-prescription drugs, or dietary supplements you use. Also tell them if you smoke, drink alcohol, or use illegal drugs. Some items may interact with your medicine. What should I watch for while using this medicine? Your condition will be monitored carefully while you are receiving this medicine. You will need important blood work done while you are taking this medicine. This medicine can cause serious allergic reactions. If you experience allergic reactions like skin rash, itching or hives, swelling of the face, lips, or tongue, tell your doctor or health care  professional right away. In some cases, you may be given  additional medicines to help with side effects. Follow all directions for their use. This drug may make you feel generally unwell. This is not uncommon, as chemotherapy can affect healthy cells as well as cancer cells. Report any side effects. Continue your course of treatment even though you feel ill unless your doctor tells you to stop. Call your doctor or health care professional for advice if you get a fever, chills or sore throat, or other symptoms of a cold or flu. Do not treat yourself. This drug decreases your body's ability to fight infections. Try to avoid being around people who are sick. This medicine may increase your risk to bruise or bleed. Call your doctor or health care professional if you notice any unusual bleeding. Be careful brushing and flossing your teeth or using a toothpick because you may get an infection or bleed more easily. If you have any dental work done, tell your dentist you are receiving this medicine. Avoid taking products that contain aspirin, acetaminophen, ibuprofen, naproxen, or ketoprofen unless instructed by your doctor. These medicines may hide a fever. Do not become pregnant while taking this medicine. Women should inform their doctor if they wish to become pregnant or think they might be pregnant. There is a potential for serious side effects to an unborn child. Talk to your health care professional or pharmacist for more information. Do not breast-feed an infant while taking this medicine. Men are advised not to father a child while receiving this medicine. What side effects may I notice from receiving this medicine? Side effects that you should report to your doctor or health care professional as soon as possible: -allergic reactions like skin rash, itching or hives, swelling of the face, lips, or tongue -low blood counts - This drug may decrease the number of white blood cells, red blood cells and platelets. You may be at increased risk for infections and  bleeding. -signs of infection - fever or chills, cough, sore throat, pain or difficulty passing urine -signs of decreased platelets or bleeding - bruising, pinpoint red spots on the skin, black, tarry stools, nosebleeds -signs of decreased red blood cells - unusually weak or tired, fainting spells, lightheadedness -breathing problems -changes in vision -chest pain -high or low blood pressure -mouth sores -nausea and vomiting -pain, swelling, redness or irritation at the injection site -pain, tingling, numbness in the hands or feet -slow or irregular heartbeat -swelling of the ankle, feet, hands Side effects that usually do not require medical attention (report to your doctor or health care professional if they continue or are bothersome): -aches, pains -changes in the color of fingernails -diarrhea -hair loss -loss of appetite This list may not describe all possible side effects. Call your doctor for medical advice about side effects. You may report side effects to FDA at 1-800-FDA-1088. Where should I keep my medicine? This drug is given in a hospital or clinic and will not be stored at home. NOTE: This sheet is a summary. It may not cover all possible information. If you have questions about this medicine, talk to your doctor, pharmacist, or health care provider.  2018 [employer]vier/Gold Standard (2015-07-01 10:05:20)

## 2018-05-31 NOTE — Telephone Encounter (Signed)
Ok to tx w/ elevated bp per MD. Pt needs to follow up w/ heart doc regarding his florinef.

## 2018-06-09 IMAGING — CT CT HEART MORP W/ CTA COR W/ SCORE W/ CA W/CM &/OR W/O CM
1 of 8 series · 10 of 20 positions shown, 13 images · IV contrast (APPLIED)
Comparison: 06/26/2017 chest radiograph.

CLINICAL DATA: Aortic stenosis

EXAM:
Cardiac TAVR CT
TECHNIQUE: The patient was scanned on a Siemens [REDACTED]ice scanner. A 100 kV
retrospective scan was triggered in the ascending thoracic aorta at
140 HU's. Gantry rotation speed was 250 msecs and collimation was .9
mm. No beta blockade or nitro were given. The 3D data set was
reconstructed in 5% intervals of the R-R cycle. Systolic and
diastolic phases were analyzed on a dedicated work station using
MPR, MIP and VRT modes. The patient received 80 cc of contrast.

[Series 7: 0-90% · axial · 0.34mm/px · z∈[+1058,+1270]mm · 10 of 4320 slices shown, 13 images]
[im 393/4320  vessel]
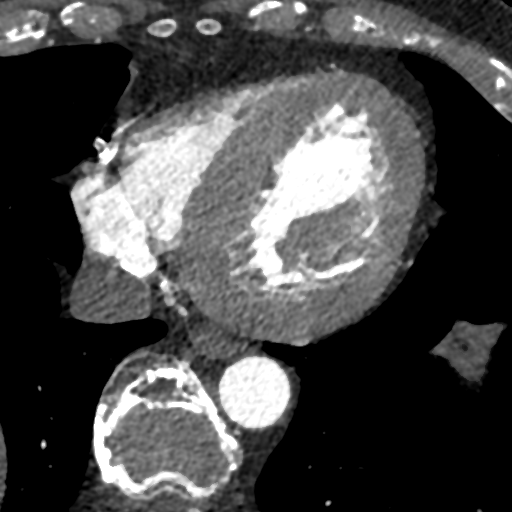
[im 393/4320  lung]
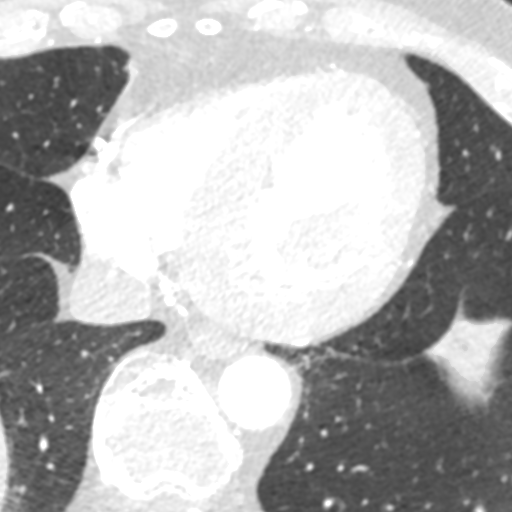
[im 786/4320  vessel]
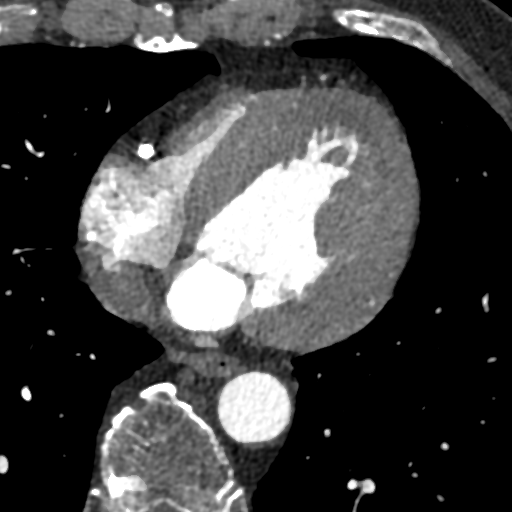
[im 1178/4320  vessel]
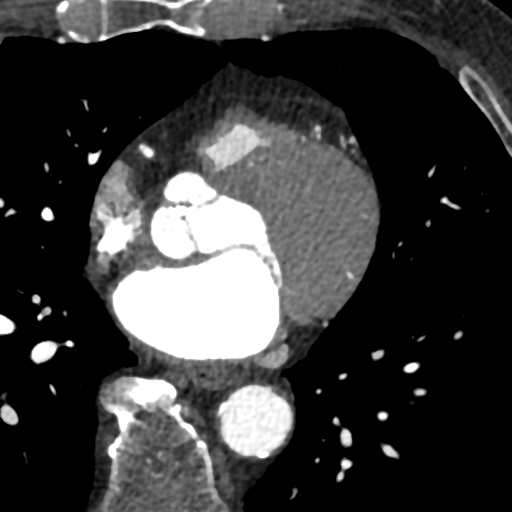
[im 1571/4320  vessel]
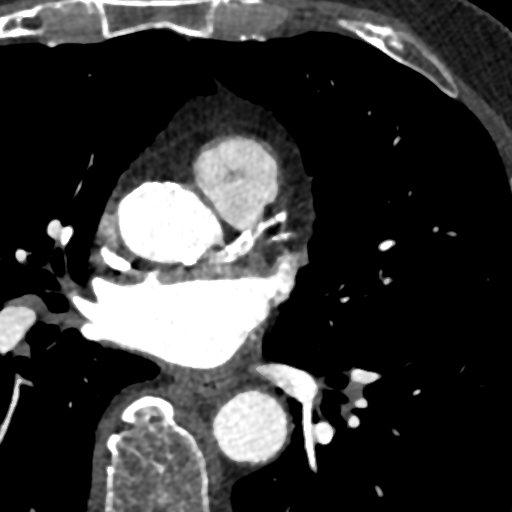
[im 1964/4320  vessel]
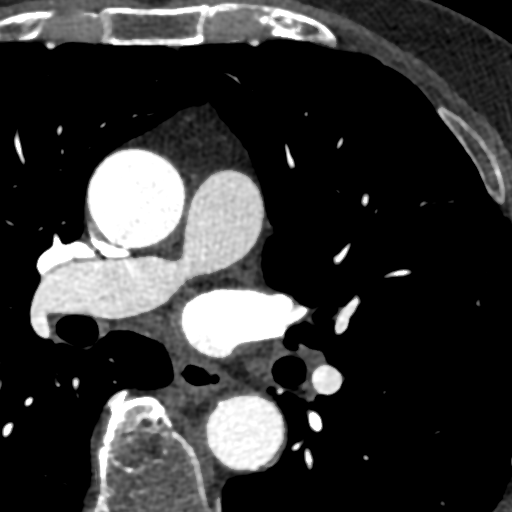
[im 1964/4320  lung]
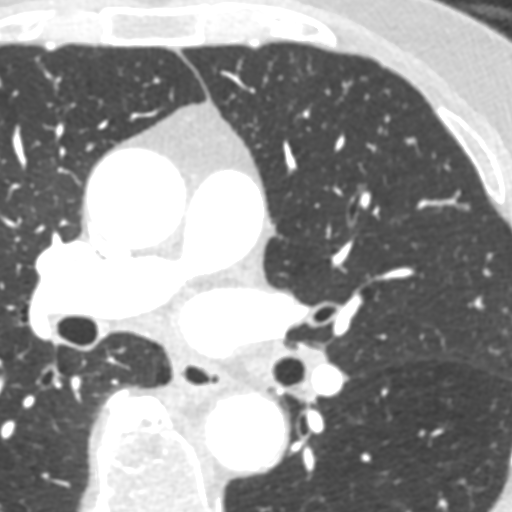
[im 2356/4320  vessel]
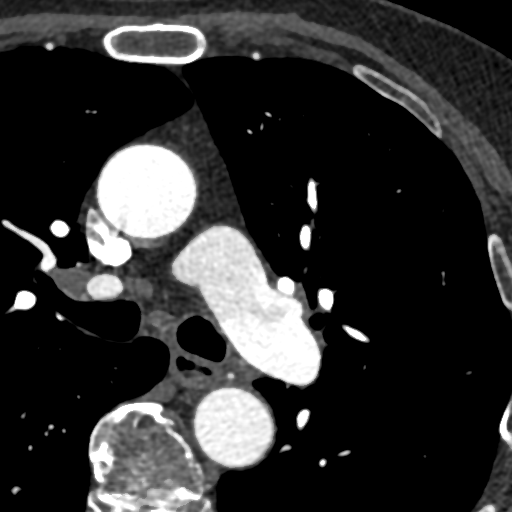
[im 2749/4320  vessel]
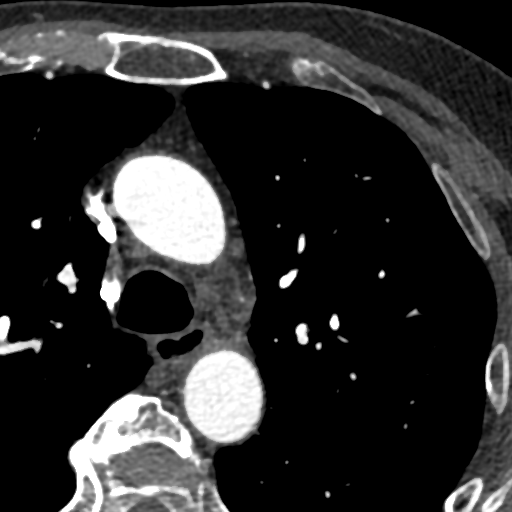
[im 3142/4320  vessel]
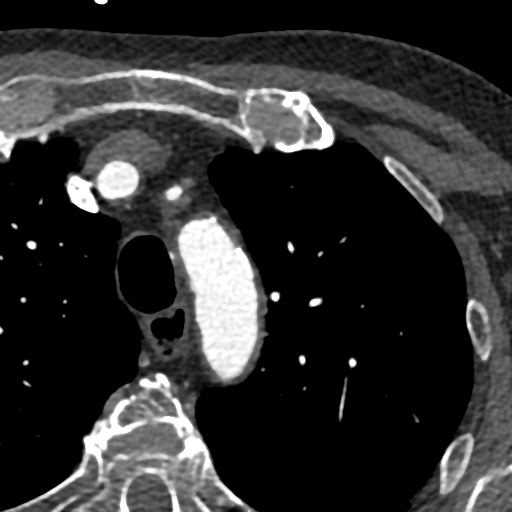
[im 3534/4320  vessel]
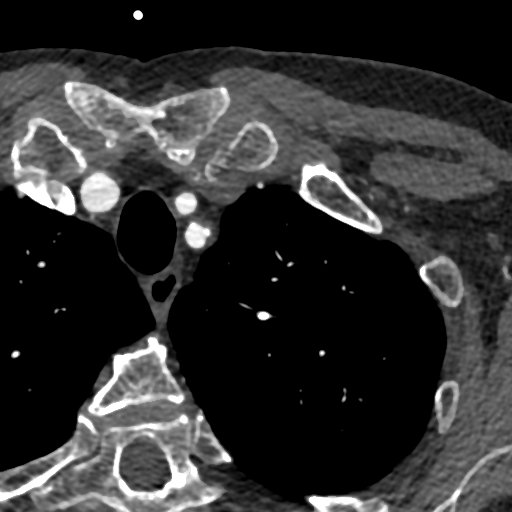
[im 3534/4320  lung]
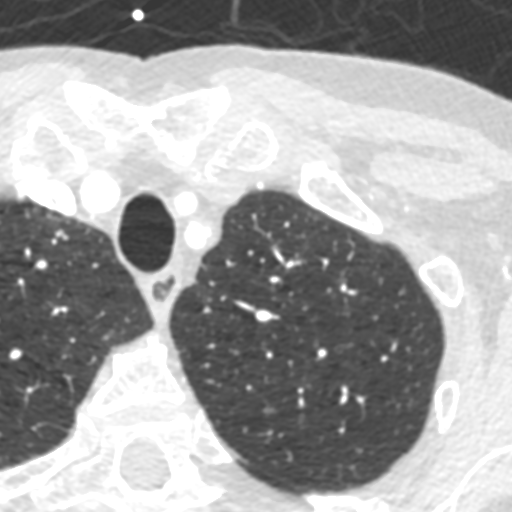
[im 3927/4320  vessel]
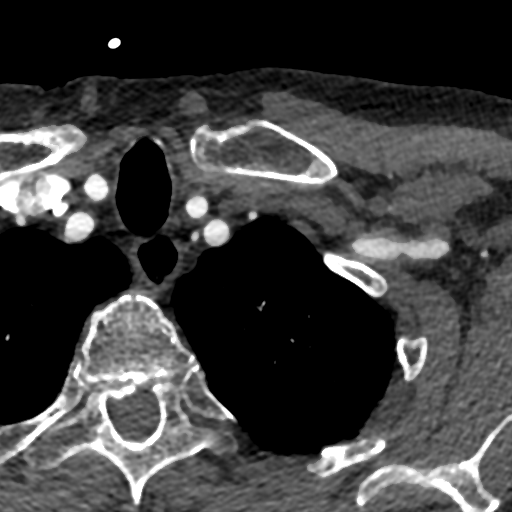

[10 of 20 positions shown; findings below may reference images not displayed]

FINDINGS: Aortic Valve:  Tri leaflet, calcified with restricted leaflet motion

Aorta:  Moderate calcific atherosclerotic debris

Sinotubular Junction:  28 mm

Ascending Thoracic Aorta:  32 mm

Aortic Arch:  26 mm

Descending Thoracic Aorta:  25 mm

Sinus of Valsalva Measurements:

Non-coronary:  32 mm

Right -coronary:  32 mm

Left -coronary:  32 mm

Coronary Artery Height above Annulus:

Left Main:  14.8 mm above annulus

Right Coronary:  16.3 mm above annulus

Virtual Basal Annulus Measurements:

Maximum/Minimum Diameter:  26.7 mm x 22.4 mm

Perimeter:  76.7 mm

Area:  453.7 mm2

Coronary Arteries:  Sufficient height above annulus for deployment

Optimum Fluoroscopic Angle for Delivery: LAO 3 degrees Caudal 7
degrees
IMPRESSION: 1) Calcified tri leaflet AV with annular area of 453 mm2 suitable
for a 26 mm Sapien 3 valve and perimeter of 76.7 mm suitable for a
29 mm Evolut Pro valve

2) Optimum angiographic angle for deployment LAO 3 degrees Caudal 7
degrees

3) Coronary arteries sufficient height above annulus for deployment

4) Normal aortic root 3.2 cm with moderate calcific atherosclerosis

Shang Rosenblum

EXAM:
OVER-READ INTERPRETATION  CT CHEST

The following report is an over-read performed by radiologist Dr.
does not include interpretation of cardiac or coronary anatomy or
pathology. The coronary CTA interpretation by the cardiologist is
attached.
FINDINGS: Please see the separate report for the concurrent chest CT angiogram
for further details regarding the significant extracardiac findings
in the chest.
IMPRESSION: Please see the separate report for the concurrent chest CT angiogram
for further details regarding the significant extracardiac findings
in the chest.

## 2018-06-12 ENCOUNTER — Other Ambulatory Visit: Payer: Self-pay

## 2018-06-12 MED ORDER — PEN NEEDLES 31G X 6 MM MISC: 0 refills | Status: DC

## 2018-06-13 ENCOUNTER — Other Ambulatory Visit: Payer: Medicare Other

## 2018-06-13 ENCOUNTER — Inpatient Hospital Stay: Payer: Medicare Other

## 2018-06-13 ENCOUNTER — Telehealth: Payer: Self-pay | Admitting: Nurse Practitioner

## 2018-06-13 ENCOUNTER — Encounter: Payer: Self-pay | Admitting: Nurse Practitioner

## 2018-06-13 ENCOUNTER — Encounter: Payer: Medicare Other | Admitting: Nutrition

## 2018-06-13 ENCOUNTER — Inpatient Hospital Stay: Payer: Medicare Other | Attending: Oncology

## 2018-06-13 ENCOUNTER — Inpatient Hospital Stay: Payer: Medicare Other | Admitting: Nutrition

## 2018-06-13 ENCOUNTER — Inpatient Hospital Stay (HOSPITAL_BASED_OUTPATIENT_CLINIC_OR_DEPARTMENT_OTHER): Payer: Medicare Other | Admitting: Nurse Practitioner

## 2018-06-13 VITALS — BP 144/80 | HR 92 | Temp 97.6°F | Resp 18 | Ht 69.0 in | Wt 130.1 lb

## 2018-06-13 DIAGNOSIS — C250 Malignant neoplasm of head of pancreas: Secondary | ICD-10-CM | POA: Diagnosis not present

## 2018-06-13 DIAGNOSIS — Z87891 Personal history of nicotine dependence: Secondary | ICD-10-CM | POA: Diagnosis not present

## 2018-06-13 DIAGNOSIS — C642 Malignant neoplasm of left kidney, except renal pelvis: Secondary | ICD-10-CM

## 2018-06-13 DIAGNOSIS — Z5111 Encounter for antineoplastic chemotherapy: Secondary | ICD-10-CM | POA: Insufficient documentation

## 2018-06-13 DIAGNOSIS — I9589 Other hypotension: Secondary | ICD-10-CM | POA: Diagnosis not present

## 2018-06-13 DIAGNOSIS — R197 Diarrhea, unspecified: Secondary | ICD-10-CM | POA: Diagnosis not present

## 2018-06-13 DIAGNOSIS — E119 Type 2 diabetes mellitus without complications: Secondary | ICD-10-CM

## 2018-06-13 DIAGNOSIS — I10 Essential (primary) hypertension: Secondary | ICD-10-CM

## 2018-06-13 LAB — CMP (CANCER CENTER ONLY)
ALT: 41 U/L (ref 0–44)
AST: 22 U/L (ref 15–41)
Albumin: 3.5 g/dL (ref 3.5–5.0)
Alkaline Phosphatase: 113 U/L (ref 38–126)
Anion gap: 7 (ref 5–15)
BUN: 26 mg/dL — ABNORMAL HIGH (ref 8–23)
CO2: 21 mmol/L — ABNORMAL LOW (ref 22–32)
Calcium: 9.4 mg/dL (ref 8.9–10.3)
Chloride: 108 mmol/L (ref 98–111)
Creatinine: 1.4 mg/dL — ABNORMAL HIGH (ref 0.61–1.24)
GFR, Est AFR Am: 56 mL/min — ABNORMAL LOW (ref >60)
GFR, Estimated: 48 mL/min — ABNORMAL LOW (ref >60)
Glucose, Bld: 196 mg/dL — ABNORMAL HIGH (ref 70–99)
Potassium: 4 mmol/L (ref 3.5–5.1)
Sodium: 136 mmol/L (ref 135–145)
Total Bilirubin: 0.4 mg/dL (ref 0.3–1.2)
Total Protein: 6.5 g/dL (ref 6.5–8.1)

## 2018-06-13 LAB — CBC WITH DIFFERENTIAL (CANCER CENTER ONLY)
Basophils Absolute: 0 10*3/uL (ref 0.0–0.1)
Basophils Relative: 0 %
Eosinophils Absolute: 0.1 10*3/uL (ref 0.0–0.5)
Eosinophils Relative: 2 %
HCT: 27.7 % — ABNORMAL LOW (ref 38.4–49.9)
Hemoglobin: 9 g/dL — ABNORMAL LOW (ref 13.0–17.1)
Lymphocytes Relative: 27 %
Lymphs Abs: 1.6 10*3/uL (ref 0.9–3.3)
MCH: 30.3 pg (ref 27.2–33.4)
MCHC: 32.5 g/dL (ref 32.0–36.0)
MCV: 93.3 fL (ref 79.3–98.0)
Monocytes Absolute: 0.9 10*3/uL (ref 0.1–0.9)
Monocytes Relative: 15 %
Neutro Abs: 3.5 10*3/uL (ref 1.5–6.5)
Neutrophils Relative %: 56 %
Platelet Count: 148 10*3/uL (ref 140–400)
RBC: 2.97 MIL/uL — ABNORMAL LOW (ref 4.20–5.82)
RDW: 17.9 % — ABNORMAL HIGH (ref 11.0–14.6)
WBC Count: 6.1 10*3/uL (ref 4.0–10.3)

## 2018-06-13 LAB — MAGNESIUM: Magnesium: 1.6 mg/dL — ABNORMAL LOW (ref 1.7–2.4)

## 2018-06-13 MED ORDER — PROCHLORPERAZINE MALEATE 10 MG PO TABS
10.0000 mg | ORAL_TABLET | Freq: Once | ORAL | Status: AC
  Administered 2018-06-13: 10 mg via ORAL

## 2018-06-13 MED ORDER — SODIUM CHLORIDE 0.9 % IV SOLN
Freq: Once | INTRAVENOUS | Status: AC
  Administered 2018-06-13: 12:00:00 via INTRAVENOUS
  Filled 2018-06-13: qty 250

## 2018-06-13 MED ORDER — SODIUM CHLORIDE 0.9 % IV SOLN
800.0000 mg/m2 | Freq: Once | INTRAVENOUS | Status: AC
  Administered 2018-06-13: 1368 mg via INTRAVENOUS
  Filled 2018-06-13: qty 36

## 2018-06-13 MED ORDER — HEPARIN SOD (PORK) LOCK FLUSH 100 UNIT/ML IV SOLN
500.0000 [IU] | Freq: Once | INTRAVENOUS | Status: AC | PRN
  Administered 2018-06-13: 500 [IU]
  Filled 2018-06-13: qty 5

## 2018-06-13 MED ORDER — PACLITAXEL PROTEIN-BOUND CHEMO INJECTION 100 MG
100.0000 mg/m2 | Freq: Once | INTRAVENOUS | Status: AC
  Administered 2018-06-13: 175 mg via INTRAVENOUS
  Filled 2018-06-13: qty 35

## 2018-06-13 MED ORDER — PROCHLORPERAZINE MALEATE 10 MG PO TABS
ORAL_TABLET | ORAL | Status: AC
  Filled 2018-06-13: qty 1

## 2018-06-13 MED ORDER — SODIUM CHLORIDE 0.9% FLUSH
10.0000 mL | INTRAVENOUS | Status: DC | PRN
  Administered 2018-06-13: 10 mL
  Filled 2018-06-13: qty 10

## 2018-06-13 NOTE — Telephone Encounter (Signed)
No 10/2 los.

## 2018-06-13 NOTE — Progress Notes (Signed)
Nutrition follow-up completed with patient and wife.   Patient is being treated for pancreas cancer.   He is status post Whipple procedure February 19, 2018. Weight is stable and documented as 130.1 pounds October 2. Patient reports loose stools have improved since beginning Creon.  Patient takes 36,000 units with each meal and snack. He no longer needs to take Lomotil and reports bowel movements every other day. Patient is concerned because he would like to gain weight. No other nutrition impact symptoms verbalized.  Nutrition diagnosis: Food and nutrition related knowledge deficit improved.  Intervention: Patient educated to continue pancreatic enzymes to help stearrhea. Recommended high-calorie, high-protein foods for weight maintenance/weight gain. Provided support and encouragement.  Monitoring, evaluation, goals: Patient will tolerate increased calories and protein to minimize weight loss.  Next visit: Wednesday, October 30 during infusion.

## 2018-06-13 NOTE — Patient Instructions (Signed)
[place] Discharge Instructions for Patients Receiving Chemotherapy  Today you received the following chemotherapy agents Abraxane, Gemzar  To help prevent nausea and vomiting after your treatment, we encourage you to take your nausea medication as prescribed.   If you develop nausea and vomiting that is not controlled by your nausea medication, call the clinic.   BELOW ARE SYMPTOMS THAT SHOULD BE REPORTED IMMEDIATELY:  *FEVER GREATER THAN 100.5 F  *CHILLS WITH OR WITHOUT FEVER  NAUSEA AND VOMITING THAT IS NOT CONTROLLED WITH YOUR NAUSEA MEDICATION  *UNUSUAL SHORTNESS OF BREATH  *UNUSUAL BRUISING OR BLEEDING  TENDERNESS IN MOUTH AND THROAT WITH OR WITHOUT PRESENCE OF ULCERS  *URINARY PROBLEMS  *BOWEL PROBLEMS  UNUSUAL RASH Items with * indicate a potential emergency and should be followed up as soon as possible.  Feel free to call the clinic should you have any questions or concerns. The clinic phone number is [phone].  Please show the [facility] at check-in to the Emergency Department and triage nurse.

## 2018-06-13 NOTE — Progress Notes (Signed)
Manuel Olson   Diagnosis: Pancreas cancer  INTERVAL HISTORY:   Manuel Olson returns as scheduled.  He completed cycle 3 gemcitabine/Abraxane 05/31/2018.  He denies nausea/vomiting.  No mouth sores.  Diarrhea continues to be improved since beginning pancreatic enzyme replacement.  No fever or rash following treatment.  He notes occasional tingling in his hands.  No numbness.  Objective:  Vital signs in last 24 hours:  Blood pressure (!) 144/80, pulse 92, temperature 97.6 F (36.4 C), temperature source Oral, resp. rate 18, height 5' 9"  (1.753 m), weight 130 lb 1.6 oz (59 kg), SpO2 100 %.    HEENT: No thrush or ulcers. Resp: Lungs clear bilaterally. Cardio: Regular rate and rhythm. GI: Abdomen soft and nontender.  No hepatomegaly. Vascular: No leg edema.  Skin: No rash. Port-A-Cath without erythema.   Lab Results:  Lab Results  Component Value Date   WBC 6.1 06/13/2018   HGB 9.0 (L) 06/13/2018   HCT 27.7 (L) 06/13/2018   MCV 93.3 06/13/2018   PLT 148 06/13/2018   NEUTROABS 3.5 06/13/2018    Imaging:  No results found.  Medications: I have reviewed the patient's current medications.  Assessment/Plan: 1. Adenocarcinoma of the pancreas head/neck, status post an EUS biopsy 08/02/2017 confirming adenocarcinoma ? EUS 08/02/2017-pancreas head/neck mass, abutment of the portal vein, no peripancreatic adenopathy ? MRI abdomen 07/27/2017-head/neck pancreas mass with mass-effect on the portal splenic confluence, no involvement of the celiac axis or superior mesenteric artery, no evidence of metastatic disease ? PET scan 08/31/2017-very hypermetabolism at the pancreatic head mass, no evidence of metastatic disease. The isolated right lower lobe nodule below PET resolution, left sided renal mass with decreased activity relative to the normal adjacent kidney ? Cycle 1 FOLFIRINOX 09/16/2017 ? Cycle 2 FOLFOX 10/16/2017 ? Cycle 3 FOLFOX  10/30/2017 ? Cycle 4 FOLFIRINOX 11/14/2017 (irinotecan resumed with a dose reduction) ? Cycle 5 FOLFIRINOX3/20/2019 (Oxaliplatin dose reduced due to thrombocytopenia) ? Cycle 6 FOLFIRINOX4/11/2017 ? CTs 12/25/2017-stable right lower lobe nodule, decrease in size of the pancreas head mass, stable left kidney mass ? Pancreaticoduodenectomy and left nephrectomy 02/19/2018,[id number], 2/18 lymph nodes positive, lymphovascular and perineural invasion present, mild to moderate treatment effect, tumor involved adherent portal vein with negative portal vein margins ? Cycle 1 adjuvant gemcitabine/Abraxane 04/25/2018 ? Cycle 2 adjuvant gemcitabine/Abraxane 05/16/2018 ? Cycle 3 adjuvant gemcitabine/Abraxane 05/31/2018 ? Cycle 4 adjuvant gemcitabine/Abraxane 06/13/2018  2. 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 Barry Dienes  9. Diarrhea- potentially related to pancreas insufficiency, he began a trial of pancreatic enzyme replacement 05/16/2018: Diarrhea improved  10. Dehydrationon 03/19/2018-statustreatments with IV fluidsforpast 2 weeks  Admission 04/03/2018 with dehydration and hypotension  Persistent orthostatic hypotension-compression stockings and Florinef added 04/10/2018  Improved    Disposition: Manuel Olson appears stable.  He has completed 3 cycles of adjuvant Gemcitabine/Abraxane.  He continues to tolerate the chemotherapy well.  Plan to proceed with cycle 4 today as scheduled.  We reviewed the CBC from today.  Counts are adequate to proceed with treatment.  He will  return for lab, follow-up and cycle 5 gemcitabine/Abraxane in 2 weeks.  He will contact the office in the interim with  any problems.    [name] ANP/GNP-BC   06/13/2018  12:17 PM

## 2018-06-14 DIAGNOSIS — C642 Malignant neoplasm of left kidney, except renal pelvis: Secondary | ICD-10-CM | POA: Diagnosis not present

## 2018-06-22 NOTE — Progress Notes (Signed)
Cardiology Office Note  Date: 06/25/2018   ID: Manuel Olson, DOB 1946-07-20, MRN [id number]  PCP: Patient, No Pcp Per  Primary Cardiologist: Rozann Lesches, MD   Chief Complaint  Patient presents with  . Coronary Artery Disease  . Aortic valve disease    History of Present Illness: Manuel Olson is a 72 y.o. male last seen in July.  He comes in today with his wife for a routine visit.  Overall no major change, he reports good appetite and fluid intake but has not gained back any weight as yet.  Still having trouble with recurring dizziness, we went over home blood pressure and heart rate checks.  He is tolerating his current medications including Florinef and resumption of low-dose lisinopril.  It sounds like he has 2 more chemotherapy sessions left.  Recent follow-up lab work is outlined below.  Otherwise he does not report any obvious angina symptoms.  He has had no frank syncope.  He also continues on Plavix and Lipitor.  Past Medical History:  Diagnosis Date  . Arthritis   . CAD (coronary artery disease)    a. 11/2016 in [place]: STEMI s/p DES to mid LAD, DES to diagonal, DES x 2 to proximal and mid RCA   . Essential hypertension   . Former smoker   . GERD (gastroesophageal reflux disease)   . History of stroke   . Hyperlipidemia   . Myocardial infarction ([place])   . Pancreatic cancer ([place])   . Pneumonia   . Renal mass   . S/P TAVR (transcatheter aortic valve replacement)    a. 08/2017:  Edwards Sapien 3 THV (size 26 mm, model #9600CM26A , serial # [id number])  . Severe aortic stenosis    a. 08/2017: s/p TAVR by Dr. Angelena Form and Dr. Cyndia Bent.   . Type 2 diabetes mellitus ([place])     Past Surgical History:  Procedure Laterality Date  . APPENDECTOMY    . CARDIAC CATHETERIZATION    . EUS N/A 08/02/2017   Procedure: UPPER ENDOSCOPIC ULTRASOUND (EUS) RADIAL;  Surgeon: [name], MD;  [facility]: [facility];  Service: Endoscopy;  Laterality: N/A;  . EYE  SURGERY Left   . HAND SURGERY Bilateral   . HERNIA REPAIR Right   . LAPAROSCOPY N/A 02/19/2018   Procedure: DIAGNOSTIC LAPAROSCOPY, ERAS PATHWAY;  Surgeon: [name], MD;  Location: [place];  Service: General;  Laterality: N/A;  . NEPHRECTOMY Left 02/19/2018   Procedure: OPEN LEFT RADICAL NEPHRECTOMY;  Surgeon: Cleon Gustin, MD;  Location: [place];  Service: Urology;  Laterality: Left;  . PORTACATH PLACEMENT N/A 09/15/2017   Procedure: INSERTION PORT-A-CATH;  Surgeon: [name], MD;  Location: [place];  Service: General;  Laterality: N/A;  . RIGHT/LEFT HEART CATH AND CORONARY ANGIOGRAPHY N/A 07/05/2017   Procedure: RIGHT/LEFT HEART CATH AND CORONARY ANGIOGRAPHY;  Surgeon: [name], MD;  Location: [place] [facility];  Service: Cardiovascular;  Laterality: N/A;  . SHOULDER ARTHROSCOPY WITH ROTATOR CUFF REPAIR Left   . TEE WITHOUT CARDIOVERSION N/A 08/22/2017   Procedure: TRANSESOPHAGEAL ECHOCARDIOGRAM (TEE);  Surgeon: Burnell Blanks, MD;  Location: [place];  Service: Open Heart Surgery;  Laterality: N/A;  . TRANSCATHETER AORTIC VALVE REPLACEMENT, TRANSFEMORAL N/A 08/22/2017   Procedure: TRANSCATHETER AORTIC VALVE REPLACEMENT, TRANSFEMORAL;  Surgeon: Burnell Blanks, MD;  Location: [place];  Service: Open Heart Surgery;  Laterality: N/A;  . WHIPPLE PROCEDURE N/A 02/19/2018   Procedure: WHIPPLE PROCEDURE;  Surgeon: [name], MD;  [facility];  Service: General;  Laterality: N/A;    Current Outpatient Medications  Medication Sig Dispense Refill  . acetaminophen (TYLENOL) 325 MG tablet Take 2 tablets (650 mg total) by mouth every 6 (six) hours as needed for mild pain, fever or headache. 100 tablet 0  . atorvastatin (LIPITOR) 20 MG tablet Take 1 tablet (20 mg total) by mouth daily. 90 tablet 1  . baclofen (LIORESAL) 10 MG tablet Take 1 tablet (10 mg total) by mouth 3 (three) times daily as needed for muscle spasms. 30 each 0  . clopidogrel  (PLAVIX) 75 MG tablet TAKE 1 TABLET BY MOUTH ONCE DAILY. 30 tablet 6  . diphenoxylate-atropine (LOMOTIL) 2.5-0.025 MG tablet diphenoxylate-atropine 2.5 mg-0.025 mg tablet 30 tablet 1  . feeding supplement, ENSURE ENLIVE, (ENSURE ENLIVE) LIQD Take 237 mLs by mouth 2 (two) times daily between meals. 60 Bottle 0  . fludrocortisone (FLORINEF) 0.1 MG tablet TAKE 1 TABLET BY MOUTH ONCE DAILY. 30 tablet 3  . Insulin Glargine (BASAGLAR KWIKPEN) 100 UNIT/ML SOPN Inject 0.14 mLs (14 Units total) into the skin at bedtime. 5 pen 5  . insulin lispro (HUMALOG KWIKPEN) 100 UNIT/ML KiwkPen Inject 0.04-0.06 mLs (4-6 Units total) into the skin 3 (three) times daily. 15 mL 5  . Insulin Pen Needle (PEN NEEDLES) 31G X 6 MM MISC Use as directed. 100 each 0  . lidocaine-prilocaine (EMLA) cream lidocaine-prilocaine 2.5 %-2.5 % topical cream    . lipase/protease/amylase (CREON) 36000 UNITS CPEP capsule Take 1 capsule by mouth before each meal and snack. Up to 6 per day. 180 capsule 0  . lisinopril (PRINIVIL,ZESTRIL) 5 MG tablet Take 1 tablet (5 mg total) by mouth daily. 90 tablet 3  . pantoprazole (PROTONIX) 40 MG tablet Take 1 tablet (40 mg total) by mouth daily at 12 noon. 30 tablet 3  . potassium chloride (K-DUR,KLOR-CON) 10 MEQ tablet Take 10 mEq by mouth daily.     No current facility-administered medications for this visit.    Allergies:  Patient has no known allergies.   Social History: The patient  reports that he quit smoking about 2 years ago. His smoking use included cigarettes. He started smoking about 59 years ago. He has a 57.00 pack-year smoking history. He has never used smokeless tobacco. He reports that he does not drink alcohol or use drugs.   ROS:  Please see the history of present illness. Otherwise, complete review of systems is positive for fatigue.  All other systems are reviewed and negative.   Physical Exam: VS:  BP (!) 154/94   Pulse (!) 58   Ht 5' 9"  (1.753 m)   Wt 128 lb (58.1 kg)    SpO2 96%   BMI 18.90 kg/m , BMI Body mass index is 18.9 kg/m.  Wt Readings from Last 3 Encounters:  06/25/18 128 lb (58.1 kg)  06/13/18 130 lb 1.6 oz (59 kg)  05/30/18 131 lb 9.6 oz (59.7 kg)    General: Cachectic appearing male, no distress. HEENT: Conjunctiva and lids normal, oropharynx clear. Neck: Supple, no elevated JVP or carotid bruits, no thyromegaly. Lungs: Clear to auscultation, nonlabored breathing at rest. Cardiac: Regular rate and rhythm, no S3, soft systolic murmur. Abdomen: Soft, nontender, bowel sounds present. Extremities: No pitting edema, distal pulses 2+. Skin: Warm and dry. Musculoskeletal: No kyphosis. Neuropsychiatric: Alert and oriented x3, affect grossly appropriate.  ECG: I personally reviewed the tracing from 08/31/2017 which showed sinus rhythm with prolonged PR interval, poor R wave progression consistent with  old anterior infarct.  Recent Labwork: 08/17/2017: B Natriuretic Peptide 163.2 04/04/2018: TSH 1.267 06/13/2018: ALT 41; AST 22; BUN 26; Creatinine 1.40; Hemoglobin 9.0; Magnesium 1.6; Platelet Count 148; Potassium 4.0; Sodium 136     Component Value Date/Time   CHOL 102 03/16/2017 0840   TRIG 71 03/16/2017 0840   HDL 36 (L) 03/16/2017 0840   CHOLHDL 2.8 03/16/2017 0840   VLDL 14 03/16/2017 0840   LDLCALC 52 03/16/2017 0840    Other Studies Reviewed Today:  Echocardiogram 09/13/2017: Study Conclusions  - Left ventricle: The cavity size was normal. There was mild concentric hypertrophy. Systolic function was mildly reduced. The estimated ejection fraction was in the range of 45% to 50%. Wall motion was normal; there were no regional wall motion abnormalities. Doppler parameters are consistent with abnormal left ventricular relaxation (grade 1 diastolic dysfunction). Doppler parameters are consistent with high ventricular filling pressure. - Aortic valve: A 30m Edwards Sapien TAVR bioprosthesis was present. There was  mild stenosis. There was mild perivalvular regurgitation. Peak velocity (S): 296 cm/s. Mean gradient (S): 16 mm Hg. - Mitral valve: Transvalvular velocity was within the normal range. There was no evidence for stenosis. There was no regurgitation. - Right ventricle: The cavity size was normal. Wall thickness was normal. Systolic function was normal. - Atrial septum: No defect or patent foramen ovale was identified by color flow Doppler.  Assessment and Plan:  1.  Multivessel CAD status post anterior STEMI in 2018 with DES to the LAD, DES to the diagonal, and DES x2 to the RCA.  He reports no active angina and remains on combination of Plavix and Lipitor.  He  2.  Orthostatic blood pressure changes with intermittent dizziness.  He has been relatively stable and has 2 more chemotherapy sessions planned.  Although hypertensive at baseline, we will not further uptitrate lisinopril, otherwise continue Florinef and compression stockings.  Likely autonomic dysfunction/neuropathy.  3.  Severe aortic stenosis status post TAVR, symptomatically stable.  Echocardiogram from earlier this year showed normal valve function with mild perivalvular regurgitation.  4.  Cardiomyopathy, LVEF 45 to 50%.  He is on lisinopril, Coreg discontinued with bradycardia at baseline.  Current medicines were reviewed with the patient today.  Disposition: Follow-up in 6 weeks.  Signed, SSatira Sark MD, FGeorg[facility] LLC10/14/2019 10:12 AM    [address]e Wittat [place] [address]Phone: [phone] Fax: [phone]

## 2018-06-24 ENCOUNTER — Other Ambulatory Visit: Payer: Self-pay | Admitting: Oncology

## 2018-06-25 ENCOUNTER — Ambulatory Visit (INDEPENDENT_AMBULATORY_CARE_PROVIDER_SITE_OTHER): Payer: Medicare Other | Admitting: Cardiology

## 2018-06-25 ENCOUNTER — Encounter: Payer: Self-pay | Admitting: Cardiology

## 2018-06-25 ENCOUNTER — Ambulatory Visit (INDEPENDENT_AMBULATORY_CARE_PROVIDER_SITE_OTHER): Payer: Medicare Other | Admitting: Internal Medicine

## 2018-06-25 ENCOUNTER — Encounter: Payer: Self-pay | Admitting: Internal Medicine

## 2018-06-25 ENCOUNTER — Ambulatory Visit: Payer: Medicare Other | Admitting: Cardiology

## 2018-06-25 VITALS — BP 150/70 | HR 92 | Ht 69.0 in | Wt 128.0 lb

## 2018-06-25 VITALS — BP 154/94 | HR 58 | Ht 69.0 in | Wt 128.0 lb

## 2018-06-25 DIAGNOSIS — I951 Orthostatic hypotension: Secondary | ICD-10-CM | POA: Diagnosis not present

## 2018-06-25 DIAGNOSIS — E782 Mixed hyperlipidemia: Secondary | ICD-10-CM | POA: Diagnosis not present

## 2018-06-25 DIAGNOSIS — I255 Ischemic cardiomyopathy: Secondary | ICD-10-CM | POA: Diagnosis not present

## 2018-06-25 DIAGNOSIS — I25119 Atherosclerotic heart disease of native coronary artery with unspecified angina pectoris: Secondary | ICD-10-CM

## 2018-06-25 DIAGNOSIS — E1165 Type 2 diabetes mellitus with hyperglycemia: Secondary | ICD-10-CM | POA: Diagnosis not present

## 2018-06-25 DIAGNOSIS — Z952 Presence of prosthetic heart valve: Secondary | ICD-10-CM

## 2018-06-25 DIAGNOSIS — E1159 Type 2 diabetes mellitus with other circulatory complications: Secondary | ICD-10-CM | POA: Diagnosis not present

## 2018-06-25 LAB — POCT GLYCOSYLATED HEMOGLOBIN (HGB A1C): Hemoglobin A1C: 7.1 % — AB (ref 4.0–5.6)

## 2018-06-25 NOTE — Patient Instructions (Addendum)
Please decrease: - Basaglar to 8-10 units at bedtime  Please restart: - Humalog 2-4 units before the 3 meals  Please return in 3-4 months with your sugar log.

## 2018-06-25 NOTE — Progress Notes (Addendum)
Patient ID: Manuel Olson, male   DOB: [birth year], 72 y.o.   MRN: [id number]   HPI: Manuel Olson is a 72 y.o.-year-old male, initially referred by his oncologist, Dr. Benay Spice, returning for follow-up for DM2, dx in 01/2016, insulin-dependent, uncontrolled, with complications (CAD - s/p AMI 11/2016 - s/p 4x DES; Ao stenosis). He saw Dr. Dorris Fetch before.  Last visit 3.5 months ago.  He is here with his wife offers a significant amount of history especially regarding his cancer diagnosis, diabetes medicines and his sugars at home.  Patient has a significant history of pancreatic cancer, diagnosed in 07/2017.  He started chemotherapy in 09/2017.  Since then, he also had a kidney removed for renal cell carcinoma and at the same time he had pancreatic resection (head of the pancreas) in 02/2018.  He was found that he had metastases in his lymph nodes.  He restarted chemotherapy after the surgery. He had 4 cycles and has 2 left.   Last hemoglobin A1c was: Lab Results  Component Value Date   HGBA1C 6.4 (H) 02/09/2018   HGBA1C 8.2 (H) 11/28/2017   HGBA1C 10.4 (H) 08/17/2017   Pt was on a regimen of: - Glipizide 5 mg daily  - Tresiba 20 units at bedtime (samples, cannot afford this)    He is currently on:  - Basaglar >> Lantus 20 >> 10 >> Basaglar 9-12 units at bedtime -     Pt was checking his sugars 4x a day - per review of his excellent log: - am:  67, 77-140, 150, 172 >> 52, 55, 59, 62-120, 135 - 2h after b'fast: n/c - before lunch: 140-301 >> 95-151, 164 >> n/c - 2h after lunch: n/c - before dinner: 107-162, 222, 243, 291 >> 140 - 2h after dinner: n/c - bedtime:  98-176 >> 143, 159-255, 332 - nighttime: n/c Lowest sugar was 82 >> 60 >> 41 >> 52; it is unclear at which level he has hypoglycemia awareness. Highest sugar was 398 >> 224 >> 290 >>332  Glucometer: True Metrix  Pt's meals are: - Breakfast: bacon + eggs + English muffin + occas. Tomato juice   - Lunch: grilled ham  and cheese sandwich + potato chips - Dinner: chicken + veggies + starch - Snacks: fruit cup, pudding  - 1x a day On Megace after he started ChTx - only in the week after ChTx.  + CKD, last BUN/creatinine:  Lab Results  Component Value Date   BUN 26 (H) 06/13/2018   BUN 19 05/30/2018   CREATININE 1.40 (H) 06/13/2018   CREATININE 1.27 (H) 05/30/2018  On Lisinopril. - +HL; last set of lipids: Lab Results  Component Value Date   CHOL 102 03/16/2017   HDL 36 (L) 03/16/2017   LDLCALC 52 03/16/2017   TRIG 71 03/16/2017   CHOLHDL 2.8 03/16/2017  On Lipitor 20 - last eye exam was in 2018: No DR."I had a stroke in my eye". + h/o LASER sx'es. Due for eye exam next mo. - nonumbness but has tingling in his feet  Pt has FH of DM in brother.  He also has a history of HTN.  ROS: Constitutional: + a little weight loss, no fatigue, no subjective hyperthermia, no subjective hypothermia Eyes: no blurry vision, no xerophthalmia ENT: no sore throat, no nodules palpated in throat, no dysphagia, no odynophagia, no hoarseness Cardiovascular: no CP/no SOB/no palpitations/no leg swelling Respiratory: no cough/no SOB/no wheezing Gastrointestinal: no N/no V/no D/no C/no acid reflux Musculoskeletal: no muscle aches/no  joint aches Skin: no rashes, no hair loss Neurological: no tremors/no numbness/no tingling/no dizziness  I reviewed pt's medications, allergies, PMH, social hx, family hx, and changes were documented in the history of present illness. Otherwise, unchanged from my initial visit note.  Past Medical History:  Diagnosis Date  . Arthritis   . CAD (coronary artery disease)    a. 11/2016 in [place]: STEMI s/p DES to mid LAD, DES to diagonal, DES x 2 to proximal and mid RCA   . Essential hypertension   . Former smoker   . GERD (gastroesophageal reflux disease)   . History of stroke   . Hyperlipidemia   . Myocardial infarction ([place])   . Pancreatic cancer ([place])   . Pneumonia   . Renal mass    . S/P TAVR (transcatheter aortic valve replacement)    a. 08/2017:  Edwards Sapien 3 THV (size 26 mm, model #9600CM26A , serial # [id number])  . Severe aortic stenosis    a. 08/2017: s/p TAVR by Dr. Angelena Form and Dr. Cyndia Bent.   . Type 2 diabetes mellitus ([place])    Past Surgical History:  Procedure Laterality Date  . APPENDECTOMY    . CARDIAC CATHETERIZATION    . EUS N/A 08/02/2017   Procedure: UPPER ENDOSCOPIC ULTRASOUND (EUS) RADIAL;  Surgeon: [name], MD;  [facility];  Service: Endoscopy;  Laterality: N/A;  . EYE SURGERY Left   . HAND SURGERY Bilateral   . HERNIA REPAIR Right   . LAPAROSCOPY N/A 02/19/2018   Procedure: DIAGNOSTIC LAPAROSCOPY, ERAS PATHWAY;  Surgeon: [name], MD;  Location: [place];  Service: General;  Laterality: N/A;  . NEPHRECTOMY Left 02/19/2018   Procedure: OPEN LEFT RADICAL NEPHRECTOMY;  Surgeon: Cleon Gustin, MD;  Location: [place];  Service: Urology;  Laterality: Left;  . PORTACATH PLACEMENT N/A 09/15/2017   Procedure: INSERTION PORT-A-CATH;  Surgeon: [name], MD;  Location: [place];  Service: General;  Laterality: N/A;  . RIGHT/LEFT HEART CATH AND CORONARY ANGIOGRAPHY N/A 07/05/2017   Procedure: RIGHT/LEFT HEART CATH AND CORONARY ANGIOGRAPHY;  Surgeon: [name], MD;  Location: [place] [facility];  Service: Cardiovascular;  Laterality: N/A;  . SHOULDER ARTHROSCOPY WITH ROTATOR CUFF REPAIR Left   . TEE WITHOUT CARDIOVERSION N/A 08/22/2017   Procedure: TRANSESOPHAGEAL ECHOCARDIOGRAM (TEE);  Surgeon: Burnell Blanks, MD;  Location: [place];  Service: Open Heart Surgery;  Laterality: N/A;  . TRANSCATHETER AORTIC VALVE REPLACEMENT, TRANSFEMORAL N/A 08/22/2017   Procedure: TRANSCATHETER AORTIC VALVE REPLACEMENT, TRANSFEMORAL;  Surgeon: Burnell Blanks, MD;  Location: [place];  Service: Open Heart Surgery;  Laterality: N/A;  . WHIPPLE PROCEDURE N/A 02/19/2018   Procedure: WHIPPLE PROCEDURE;  Surgeon:  [name], MD;  Location: [place];  Service: General;  Laterality: N/A;   Social History   Socioeconomic History  . Marital status: Married    Spouse name: Not on file  . Number of children: Not on file  Social Needs  Occupational History  .  Retired  Tobacco Use  . Smoking status: Former Smoker    Packs/day: 1.00    Years: 57.00    Pack years: 57.00    Types: Cigarettes    Start date: 09/12/1958    Last attempt to quit: 2018    Years since quitting: 1.5  . Smokeless tobacco: Never Used  Substance and Sexual Activity  . Alcohol use: No  . Drug use: No   Current Outpatient Medications on File Prior to Visit  Medication Sig  Dispense Refill  . acetaminophen (TYLENOL) 325 MG tablet Take 2 tablets (650 mg total) by mouth every 6 (six) hours as needed for mild pain, fever or headache. 100 tablet 0  . atorvastatin (LIPITOR) 20 MG tablet Take 1 tablet (20 mg total) by mouth daily. 90 tablet 1  . baclofen (LIORESAL) 10 MG tablet Take 1 tablet (10 mg total) by mouth 3 (three) times daily as needed for muscle spasms. 30 each 0  . clopidogrel (PLAVIX) 75 MG tablet TAKE 1 TABLET BY MOUTH ONCE DAILY. 30 tablet 6  . diphenoxylate-atropine (LOMOTIL) 2.5-0.025 MG tablet diphenoxylate-atropine 2.5 mg-0.025 mg tablet 30 tablet 1  . feeding supplement, ENSURE ENLIVE, (ENSURE ENLIVE) LIQD Take 237 mLs by mouth 2 (two) times daily between meals. 60 Bottle 0  . fludrocortisone (FLORINEF) 0.1 MG tablet TAKE 1 TABLET BY MOUTH ONCE DAILY. 30 tablet 3  . Insulin Glargine (BASAGLAR KWIKPEN) 100 UNIT/ML SOPN Inject 0.14 mLs (14 Units total) into the skin at bedtime. 5 pen 5  . insulin lispro (HUMALOG KWIKPEN) 100 UNIT/ML KiwkPen Inject 0.04-0.06 mLs (4-6 Units total) into the skin 3 (three) times daily. 15 mL 5  . Insulin Pen Needle (PEN NEEDLES) 31G X 6 MM MISC Use as directed. 100 each 0  . lidocaine-prilocaine (EMLA) cream lidocaine-prilocaine 2.5 %-2.5 % topical cream    . lipase/protease/amylase  (CREON) 36000 UNITS CPEP capsule Take 1 capsule by mouth before each meal and snack. Up to 6 per day. 180 capsule 0  . lisinopril (PRINIVIL,ZESTRIL) 5 MG tablet Take 1 tablet (5 mg total) by mouth daily. 90 tablet 3  . pantoprazole (PROTONIX) 40 MG tablet Take 1 tablet (40 mg total) by mouth daily at 12 noon. 30 tablet 3   No current facility-administered medications on file prior to visit.    No Known Allergies Family History  Problem Relation Age of Onset  . Lupus Mother   . CAD Brother   . Hypertension Brother     PE: BP (!) 150/70   Pulse 92   Ht 5' 9"  (1.753 m)   Wt 128 lb (58.1 kg)   SpO2 99%   BMI 18.90 kg/m  Wt Readings from Last 3 Encounters:  06/25/18 128 lb (58.1 kg)  06/25/18 128 lb (58.1 kg)  06/13/18 130 lb 1.6 oz (59 kg)   Constitutional: thin, in NAD, + temporal wasting Eyes: PERRLA, EOMI, no exophthalmos ENT: moist mucous membranes, no thyromegaly, no cervical lymphadenopathy Cardiovascular: tachycardia, RR, No MRG Respiratory: CTA B Gastrointestinal: abdomen soft, NT, ND, BS+ Musculoskeletal: no deformities, strength intact in all 4 Skin: moist, warm, no rashes Neurological:no tremor with outstretched hands, DTR normal in all 4  ASSESSMENT: 1. DM2, non-insulin-dependent, uncontrolled, with complications - CAD - s/p AMI 11/2016 - s/p DES x4 -  Ao stenosis  2. HL  PLAN:  1. Patient with history of initially controlled diabetes, with deteriorating control around the time of his pancreatic cancer diagnosis at the end of 2018.  He was initially on glipizide, with good control, but after his diagnosis, we started insulin, first long-acting insulin and then added rapid acting insulin.  However, since last visit, he tells me that he did not take his Humalog almost at all.  He is adjusting the dose of Basaglar between 9 and 12 units as sugars at bedtime a high.  Sugars in the morning are at goal.  We discussed that rather than reacting to the hyperglycemia after  dinner, we need to prevent it.  Therefore,  I suggested to add Humalog before each of his meals, especially since he is trying to eat more and gain weight and is also on Megace.  However, will add lower doses of Humalog, since he does not have high insulin resistance.  Also, I advised him to reduce his Basaglar since he occasionally has low blood sugars in the morning, in the 50s. -He will and his chemotherapy at the end of this month, in 2 weeks.  I hope that after chemotherapy he may be able to come off Humalog.  We discussed at this visit, though, we do not have many options for treatment for him so we may need to continue with long-acting insulin at least. - I suggested to:  Patient Instructions  Please decrease: - Basaglar to 8-10 units at bedtime  Please restart: - Humalog 2-4 units before the 3 meals  Please return in 3-4 months with your sugar log.   - today, HbA1c is 7.1% (higher) - continue checking sugars at different times of the day - check 4x a day, rotating checks - advised for yearly eye exams >> he is UTD - Return to clinic in 3-4 mo with sugar log  - at next visit, need to check insulin production   2. HL - Reviewed latest lipid panel from 03/2017: LDL at goal, HDL low Lab Results  Component Value Date   CHOL 102 03/16/2017   HDL 36 (L) 03/16/2017   LDLCALC 52 03/16/2017   TRIG 71 03/16/2017   CHOLHDL 2.8 03/16/2017  - Continues Lipitor without side effects.  Philemon Kingdom, MD PhD [facility] Endocrinology

## 2018-06-25 NOTE — Patient Instructions (Signed)
Medication Instructions:  Continue all current medications.  Labwork: none  Testing/Procedures: none  Follow-Up: 6 weeks   Any Other Special Instructions Will Be Listed Below (If Applicable).  If you need a refill on your cardiac medications before your next appointment, please call your pharmacy.

## 2018-06-27 ENCOUNTER — Inpatient Hospital Stay: Payer: Medicare Other

## 2018-06-27 ENCOUNTER — Encounter: Payer: Self-pay | Admitting: Nurse Practitioner

## 2018-06-27 ENCOUNTER — Inpatient Hospital Stay (HOSPITAL_BASED_OUTPATIENT_CLINIC_OR_DEPARTMENT_OTHER): Payer: Medicare Other | Admitting: Nurse Practitioner

## 2018-06-27 VITALS — BP 155/86 | HR 85 | Temp 98.5°F | Resp 18 | Ht 69.0 in | Wt 128.5 lb

## 2018-06-27 DIAGNOSIS — C250 Malignant neoplasm of head of pancreas: Secondary | ICD-10-CM

## 2018-06-27 DIAGNOSIS — C642 Malignant neoplasm of left kidney, except renal pelvis: Secondary | ICD-10-CM | POA: Diagnosis not present

## 2018-06-27 DIAGNOSIS — I10 Essential (primary) hypertension: Secondary | ICD-10-CM

## 2018-06-27 DIAGNOSIS — I9589 Other hypotension: Secondary | ICD-10-CM | POA: Diagnosis not present

## 2018-06-27 DIAGNOSIS — Z95828 Presence of other vascular implants and grafts: Secondary | ICD-10-CM

## 2018-06-27 DIAGNOSIS — E119 Type 2 diabetes mellitus without complications: Secondary | ICD-10-CM | POA: Diagnosis not present

## 2018-06-27 DIAGNOSIS — Z5111 Encounter for antineoplastic chemotherapy: Secondary | ICD-10-CM | POA: Diagnosis not present

## 2018-06-27 LAB — CMP (CANCER CENTER ONLY)
ALT: 41 U/L (ref 0–44)
AST: 26 U/L (ref 15–41)
Albumin: 3.4 g/dL — ABNORMAL LOW (ref 3.5–5.0)
Alkaline Phosphatase: 95 U/L (ref 38–126)
Anion gap: 9 (ref 5–15)
BUN: 28 mg/dL — ABNORMAL HIGH (ref 8–23)
CO2: 20 mmol/L — ABNORMAL LOW (ref 22–32)
Calcium: 9.3 mg/dL (ref 8.9–10.3)
Chloride: 110 mmol/L (ref 98–111)
Creatinine: 1.47 mg/dL — ABNORMAL HIGH (ref 0.61–1.24)
GFR, Est AFR Am: 52 mL/min — ABNORMAL LOW (ref >60)
GFR, Estimated: 45 mL/min — ABNORMAL LOW (ref >60)
Glucose, Bld: 219 mg/dL — ABNORMAL HIGH (ref 70–99)
Potassium: 3.6 mmol/L (ref 3.5–5.1)
Sodium: 139 mmol/L (ref 135–145)
Total Bilirubin: 0.3 mg/dL (ref 0.3–1.2)
Total Protein: 6.6 g/dL (ref 6.5–8.1)

## 2018-06-27 LAB — CBC WITH DIFFERENTIAL (CANCER CENTER ONLY)
Abs Immature Granulocytes: 0.03 10*3/uL (ref 0.00–0.07)
Basophils Absolute: 0 10*3/uL (ref 0.0–0.1)
Basophils Relative: 1 %
Eosinophils Absolute: 0.1 10*3/uL (ref 0.0–0.5)
Eosinophils Relative: 1 %
HCT: 25.9 % — ABNORMAL LOW (ref 39.0–52.0)
Hemoglobin: 8.5 g/dL — ABNORMAL LOW (ref 13.0–17.0)
Immature Granulocytes: 1 %
Lymphocytes Relative: 22 %
Lymphs Abs: 1.3 10*3/uL (ref 0.7–4.0)
MCH: 31.3 pg (ref 26.0–34.0)
MCHC: 32.8 g/dL (ref 30.0–36.0)
MCV: 95.2 fL (ref 80.0–100.0)
Monocytes Absolute: 0.8 10*3/uL (ref 0.1–1.0)
Monocytes Relative: 12 %
Neutro Abs: 3.9 10*3/uL (ref 1.7–7.7)
Neutrophils Relative %: 63 %
Platelet Count: 180 10*3/uL (ref 150–400)
RBC: 2.72 MIL/uL — ABNORMAL LOW (ref 4.22–5.81)
RDW: 17 % — ABNORMAL HIGH (ref 11.5–15.5)
WBC Count: 6.1 10*3/uL (ref 4.0–10.5)
nRBC: 0 % (ref 0.0–0.2)

## 2018-06-27 LAB — MAGNESIUM: Magnesium: 1.6 mg/dL — ABNORMAL LOW (ref 1.7–2.4)

## 2018-06-27 MED ORDER — PROCHLORPERAZINE MALEATE 10 MG PO TABS
10.0000 mg | ORAL_TABLET | Freq: Once | ORAL | Status: AC
  Administered 2018-06-27: 10 mg via ORAL

## 2018-06-27 MED ORDER — PACLITAXEL PROTEIN-BOUND CHEMO INJECTION 100 MG
100.0000 mg/m2 | Freq: Once | INTRAVENOUS | Status: AC
  Administered 2018-06-27: 175 mg via INTRAVENOUS
  Filled 2018-06-27: qty 35

## 2018-06-27 MED ORDER — SODIUM CHLORIDE 0.9 % IV SOLN
Freq: Once | INTRAVENOUS | Status: AC
  Administered 2018-06-27: 13:00:00 via INTRAVENOUS
  Filled 2018-06-27: qty 250

## 2018-06-27 MED ORDER — PROCHLORPERAZINE MALEATE 10 MG PO TABS
ORAL_TABLET | ORAL | Status: AC
  Filled 2018-06-27: qty 1

## 2018-06-27 MED ORDER — SODIUM CHLORIDE 0.9 % IV SOLN
800.0000 mg/m2 | Freq: Once | INTRAVENOUS | Status: AC
  Administered 2018-06-27: 1368 mg via INTRAVENOUS
  Filled 2018-06-27: qty 35.98

## 2018-06-27 MED ORDER — MAGNESIUM OXIDE 400 (241.3 MG) MG PO TABS: 400.0000 mg | ORAL_TABLET | Freq: Two times a day (BID) | ORAL | 0 refills | Status: DC

## 2018-06-27 MED ORDER — SODIUM CHLORIDE 0.9% FLUSH
10.0000 mL | INTRAVENOUS | Status: DC | PRN
  Administered 2018-06-27: 10 mL
  Filled 2018-06-27: qty 10

## 2018-06-27 MED ORDER — HEPARIN SOD (PORK) LOCK FLUSH 100 UNIT/ML IV SOLN
500.0000 [IU] | Freq: Once | INTRAVENOUS | Status: AC | PRN
  Administered 2018-06-27: 500 [IU]
  Filled 2018-06-27: qty 5

## 2018-06-27 MED ORDER — SODIUM CHLORIDE 0.9% FLUSH
10.0000 mL | Freq: Once | INTRAVENOUS | Status: AC
  Administered 2018-06-27: 10 mL
  Filled 2018-06-27: qty 10

## 2018-06-27 NOTE — Progress Notes (Addendum)
[place] OFFICE PROGRESS NOTE   Diagnosis: Pancreas cancer  INTERVAL HISTORY:   Mr. Batson returns as scheduled.  He completed cycle 4 gemcitabine/Abraxane 06/13/2018.  No significant nausea/vomiting.  A few mouth sores which have resolved.  He was able to eat and drink without difficulty.  Appetite is good.  Diarrhea has resolved.  No numbness or tingling in the hands or feet.  He notes intermittent dyspnea and dizziness after each cycle of chemotherapy.  Objective:  Vital signs in last 24 hours:  Blood pressure (!) 155/86, pulse 85, temperature 98.5 F (36.9 C), temperature source Oral, resp. rate 18, height 5' 9"  (1.753 m), weight 128 lb 8 oz (58.3 kg), SpO2 100 %.    HEENT: No thrush or ulcers. Resp: Lungs clear bilaterally. Cardio: Regular rate and rhythm. GI: Abdomen soft and nontender.  No hepatomegaly. Vascular: No leg edema. Neuro: Very minimally decreased vibratory sense over the fingertips for tuning fork exam. Skin: No rash. Port-A-Cath without erythema.   Lab Results:  Lab Results  Component Value Date   WBC 6.1 06/27/2018   HGB 8.5 (L) 06/27/2018   HCT 25.9 (L) 06/27/2018   MCV 95.2 06/27/2018   PLT 180 06/27/2018   NEUTROABS 3.9 06/27/2018    Imaging:  No results found.  Medications: I have reviewed the patient's current medications.  Assessment/Plan: 1. Adenocarcinoma of the pancreas head/neck, status post an EUS biopsy 08/02/2017 confirming adenocarcinoma ? EUS 08/02/2017-pancreas head/neck mass, abutment of the portal vein, no peripancreatic adenopathy ? MRI abdomen 07/27/2017-head/neck pancreas mass with mass-effect on the portal splenic confluence, no involvement of the celiac axis or superior mesenteric artery, no evidence of metastatic disease ? PET scan 08/31/2017-very hypermetabolism at the pancreatic head mass, no evidence of metastatic disease. The isolated right lower lobe nodule below PET resolution, left sided renal  mass with decreased activity relative to the normal adjacent kidney ? Cycle 1 FOLFIRINOX 09/16/2017 ? Cycle 2 FOLFOX 10/16/2017 ? Cycle 3 FOLFOX 10/30/2017 ? Cycle 4 FOLFIRINOX 11/14/2017 (irinotecan resumed with a dose reduction) ? Cycle 5 FOLFIRINOX3/20/2019 (Oxaliplatin dose reduced due to thrombocytopenia) ? Cycle 6 FOLFIRINOX4/11/2017 ? CTs 12/25/2017-stable right lower lobe nodule, decrease in size of the pancreas head mass, stable left kidney mass ? Pancreaticoduodenectomy and left nephrectomy 02/19/2018,pT1cpN1, 2/18 lymph nodes positive, lymphovascular and perineural invasion present, mild to moderate treatment effect, tumor involved adherent portal vein with negative portal vein margins ? Cycle 1 adjuvant gemcitabine/Abraxane 04/25/2018 ? Cycle 2 adjuvant gemcitabine/Abraxane 05/16/2018 ? Cycle 3 adjuvant gemcitabine/Abraxane 05/31/2018 ? Cycle 4 adjuvant gemcitabine/Abraxane 06/13/2018 ? Cycle 5 adjuvant gemcitabine/Abraxane 06/27/2018  2. 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 Barry Dienes  9. Diarrhea-potentially related to pancreas insufficiency, he began a trial of pancreatic enzyme replacement 05/16/2018: Diarrhea improved  10. Dehydrationon 03/19/2018-statustreatments with IV fluidsforpast 2 weeks  Admission 04/03/2018 with dehydration and hypotension  Persistent orthostatic hypotension-compression stockings and Florinef added 04/10/2018  Improved    Disposition: Mr. Cu appears stable.  He has completed 4 cycles of adjuvant  gemcitabine/Abraxane.  Plan to proceed with cycle 5 today as scheduled.  We reviewed the CBC from today.  Counts are adequate for treatment.  He  will return for lab, follow-up and the sixth and final cycle of gemcitabine/Abraxane in 2 weeks.  He will contact the office in the interim with any problems.  Patient seen with Dr. Benay Spice.    [name] ANP/GNP-BC   06/27/2018  12:38 PM  This was a shared visit with [name].  Mr. Brummet appears stable.  He will complete another treatment with gemcitabine/Abraxane today.  Julieanne Manson, MD

## 2018-06-27 NOTE — Patient Instructions (Signed)
[place] Discharge Instructions for Patients Receiving Chemotherapy  Today you received the following chemotherapy agents Abraxane, Gemzar  To help prevent nausea and vomiting after your treatment, we encourage you to take your nausea medication as prescribed.   If you develop nausea and vomiting that is not controlled by your nausea medication, call the clinic.   BELOW ARE SYMPTOMS THAT SHOULD BE REPORTED IMMEDIATELY:  *FEVER GREATER THAN 100.5 F  *CHILLS WITH OR WITHOUT FEVER  NAUSEA AND VOMITING THAT IS NOT CONTROLLED WITH YOUR NAUSEA MEDICATION  *UNUSUAL SHORTNESS OF BREATH  *UNUSUAL BRUISING OR BLEEDING  TENDERNESS IN MOUTH AND THROAT WITH OR WITHOUT PRESENCE OF ULCERS  *URINARY PROBLEMS  *BOWEL PROBLEMS  UNUSUAL RASH Items with * indicate a potential emergency and should be followed up as soon as possible.  Feel free to call the clinic should you have any questions or concerns. The clinic phone number is [phone].  Please show the [place] [facility] at check-in to the Emergency Department and triage nurse.

## 2018-07-11 ENCOUNTER — Inpatient Hospital Stay (HOSPITAL_BASED_OUTPATIENT_CLINIC_OR_DEPARTMENT_OTHER): Payer: Medicare Other | Admitting: Nurse Practitioner

## 2018-07-11 ENCOUNTER — Inpatient Hospital Stay: Payer: Medicare Other

## 2018-07-11 ENCOUNTER — Inpatient Hospital Stay: Payer: Medicare Other | Admitting: Nutrition

## 2018-07-11 ENCOUNTER — Encounter: Payer: Self-pay | Admitting: Nurse Practitioner

## 2018-07-11 ENCOUNTER — Telehealth: Payer: Self-pay | Admitting: Oncology

## 2018-07-11 VITALS — BP 158/86 | HR 86 | Temp 97.8°F | Resp 18 | Ht 69.0 in | Wt 129.4 lb

## 2018-07-11 DIAGNOSIS — Z95828 Presence of other vascular implants and grafts: Secondary | ICD-10-CM

## 2018-07-11 DIAGNOSIS — I255 Ischemic cardiomyopathy: Secondary | ICD-10-CM

## 2018-07-11 DIAGNOSIS — C642 Malignant neoplasm of left kidney, except renal pelvis: Secondary | ICD-10-CM | POA: Diagnosis not present

## 2018-07-11 DIAGNOSIS — Z87891 Personal history of nicotine dependence: Secondary | ICD-10-CM | POA: Diagnosis not present

## 2018-07-11 DIAGNOSIS — I9589 Other hypotension: Secondary | ICD-10-CM

## 2018-07-11 DIAGNOSIS — E119 Type 2 diabetes mellitus without complications: Secondary | ICD-10-CM | POA: Diagnosis not present

## 2018-07-11 DIAGNOSIS — I10 Essential (primary) hypertension: Secondary | ICD-10-CM | POA: Diagnosis not present

## 2018-07-11 DIAGNOSIS — C250 Malignant neoplasm of head of pancreas: Secondary | ICD-10-CM

## 2018-07-11 DIAGNOSIS — Z5111 Encounter for antineoplastic chemotherapy: Secondary | ICD-10-CM | POA: Diagnosis not present

## 2018-07-11 LAB — CMP (CANCER CENTER ONLY)
ALT: 26 U/L (ref 0–44)
AST: 15 U/L (ref 15–41)
Albumin: 3.5 g/dL (ref 3.5–5.0)
Alkaline Phosphatase: 99 U/L (ref 38–126)
Anion gap: 7 (ref 5–15)
BUN: 27 mg/dL — ABNORMAL HIGH (ref 8–23)
CO2: 26 mmol/L (ref 22–32)
Calcium: 9.2 mg/dL (ref 8.9–10.3)
Chloride: 104 mmol/L (ref 98–111)
Creatinine: 1.49 mg/dL — ABNORMAL HIGH (ref 0.61–1.24)
GFR, Est AFR Am: 52 mL/min — ABNORMAL LOW (ref >60)
GFR, Estimated: 44 mL/min — ABNORMAL LOW (ref >60)
Glucose, Bld: 212 mg/dL — ABNORMAL HIGH (ref 70–99)
Potassium: 4 mmol/L (ref 3.5–5.1)
Sodium: 137 mmol/L (ref 135–145)
Total Bilirubin: 0.5 mg/dL (ref 0.3–1.2)
Total Protein: 6.6 g/dL (ref 6.5–8.1)

## 2018-07-11 LAB — CBC WITH DIFFERENTIAL (CANCER CENTER ONLY)
Abs Immature Granulocytes: 0.03 10*3/uL (ref 0.00–0.07)
Basophils Absolute: 0 10*3/uL (ref 0.0–0.1)
Basophils Relative: 0 %
Eosinophils Absolute: 0.1 10*3/uL (ref 0.0–0.5)
Eosinophils Relative: 2 %
HCT: 27.6 % — ABNORMAL LOW (ref 39.0–52.0)
Hemoglobin: 8.7 g/dL — ABNORMAL LOW (ref 13.0–17.0)
Immature Granulocytes: 1 %
Lymphocytes Relative: 20 %
Lymphs Abs: 1.3 10*3/uL (ref 0.7–4.0)
MCH: 31 pg (ref 26.0–34.0)
MCHC: 31.5 g/dL (ref 30.0–36.0)
MCV: 98.2 fL (ref 80.0–100.0)
Monocytes Absolute: 0.8 10*3/uL (ref 0.1–1.0)
Monocytes Relative: 13 %
Neutro Abs: 4 10*3/uL (ref 1.7–7.7)
Neutrophils Relative %: 64 %
Platelet Count: 160 10*3/uL (ref 150–400)
RBC: 2.81 MIL/uL — ABNORMAL LOW (ref 4.22–5.81)
RDW: 16.8 % — ABNORMAL HIGH (ref 11.5–15.5)
WBC Count: 6.3 10*3/uL (ref 4.0–10.5)
nRBC: 0 % (ref 0.0–0.2)

## 2018-07-11 LAB — MAGNESIUM: Magnesium: 1.8 mg/dL (ref 1.7–2.4)

## 2018-07-11 MED ORDER — SODIUM CHLORIDE 0.9 % IV SOLN
800.0000 mg/m2 | Freq: Once | INTRAVENOUS | Status: AC
  Administered 2018-07-11: 1368 mg via INTRAVENOUS
  Filled 2018-07-11: qty 35.98

## 2018-07-11 MED ORDER — PROCHLORPERAZINE MALEATE 10 MG PO TABS
10.0000 mg | ORAL_TABLET | Freq: Once | ORAL | Status: AC
  Administered 2018-07-11: 10 mg via ORAL

## 2018-07-11 MED ORDER — SODIUM CHLORIDE 0.9 % IV SOLN
Freq: Once | INTRAVENOUS | Status: AC
  Administered 2018-07-11: 13:00:00 via INTRAVENOUS
  Filled 2018-07-11: qty 250

## 2018-07-11 MED ORDER — PACLITAXEL PROTEIN-BOUND CHEMO INJECTION 100 MG
100.0000 mg/m2 | Freq: Once | INTRAVENOUS | Status: AC
  Administered 2018-07-11: 175 mg via INTRAVENOUS
  Filled 2018-07-11: qty 35

## 2018-07-11 MED ORDER — SODIUM CHLORIDE 0.9% FLUSH
10.0000 mL | INTRAVENOUS | Status: DC | PRN
  Administered 2018-07-11: 10 mL
  Filled 2018-07-11: qty 10

## 2018-07-11 MED ORDER — SODIUM CHLORIDE 0.9% FLUSH
10.0000 mL | Freq: Once | INTRAVENOUS | Status: AC
  Administered 2018-07-11: 10 mL
  Filled 2018-07-11: qty 10

## 2018-07-11 MED ORDER — PROCHLORPERAZINE MALEATE 10 MG PO TABS
ORAL_TABLET | ORAL | Status: AC
  Filled 2018-07-11: qty 1

## 2018-07-11 MED ORDER — HEPARIN SOD (PORK) LOCK FLUSH 100 UNIT/ML IV SOLN
500.0000 [IU] | Freq: Once | INTRAVENOUS | Status: AC | PRN
  Administered 2018-07-11: 500 [IU]
  Filled 2018-07-11: qty 5

## 2018-07-11 NOTE — Telephone Encounter (Signed)
Appts scheduled avs/calendar printed per 10/30 los

## 2018-07-11 NOTE — Progress Notes (Signed)
Brief nutrition follow-up completed with patient during infusion for pancreas cancer.  Patient is status post Whipple procedure February 19, 2018. Weight documented as 129.4 pounds October 30.  Weight is stable overall. Patient reports his stools have improved since he is taking Creon.  Patient denies other nutrition impact symptoms.  Nutrition diagnosis: Food and nutrition related knowledge deficit resolved.  Educated patient please contact me if he has any questions or concerns.  He has my contact information.  **Disclaimer: This note was dictated with voice recognition software. Similar sounding words can inadvertently be transcribed and this note may contain transcription errors which may not have been corrected upon publication of note.**

## 2018-07-11 NOTE — Progress Notes (Addendum)
Manuel Olson   Diagnosis: Pancreas cancer  INTERVAL HISTORY:   Manuel Olson returns as scheduled.  He completed cycle 5 adjuvant Gemcitabine/Abraxane 06/27/2018.  He denies nausea/vomiting.  No mouth sores.  No diarrhea.  No change in baseline neuropathy symptoms.  No rash or fever following treatment.  He has a good appetite.  Objective:  Vital signs in last 24 hours:  Blood pressure (!) 158/86, pulse 86, temperature 97.8 F (36.6 C), temperature source Oral, resp. rate 18, height 5' 9"  (1.753 m), weight 129 lb 6.4 oz (58.7 kg), SpO2 99 %.    HEENT: No thrush or ulcers. Resp: Lungs clear bilaterally. Cardio: Regular rate and rhythm. GI: Abdomen soft and nontender.  No hepatomegaly. Vascular: No leg edema. Skin: Left forearm with scattered ecchymoses. Port-A-Cath without erythema.   Lab Results:  Lab Results  Component Value Date   WBC 6.3 07/11/2018   HGB 8.7 (L) 07/11/2018   HCT 27.6 (L) 07/11/2018   MCV 98.2 07/11/2018   PLT 160 07/11/2018   NEUTROABS 4.0 07/11/2018    Imaging:  No results found.  Medications: I have reviewed the patient's current medications.  Assessment/Plan: 1. Adenocarcinoma of the pancreas head/neck, status post an EUS biopsy 08/02/2017 confirming adenocarcinoma ? EUS 08/02/2017-pancreas head/neck mass, abutment of the portal vein, no peripancreatic adenopathy ? MRI abdomen 07/27/2017-head/neck pancreas mass with mass-effect on the portal splenic confluence, no involvement of the celiac axis or superior mesenteric artery, no evidence of metastatic disease ? PET scan 08/31/2017-very hypermetabolism at the pancreatic head mass, no evidence of metastatic disease. The isolated right lower lobe nodule below PET resolution, left sided renal mass with decreased activity relative to the normal adjacent kidney ? Cycle 1 FOLFIRINOX 09/16/2017 ? Cycle 2 FOLFOX 10/16/2017 ? Cycle 3 FOLFOX 10/30/2017 ? Cycle 4  FOLFIRINOX 11/14/2017 (irinotecan resumed with a dose reduction) ? Cycle 5 FOLFIRINOX3/20/2019 (Oxaliplatin dose reduced due to thrombocytopenia) ? Cycle 6 FOLFIRINOX4/11/2017 ? CTs 12/25/2017-stable right lower lobe nodule, decrease in size of the pancreas head mass, stable left kidney mass ? Pancreaticoduodenectomy and left nephrectomy 02/19/2018,pT1cpN1, 2/18 lymph nodes positive, lymphovascular and perineural invasion present, mild to moderate treatment effect, tumor involved adherent portal vein with negative portal vein margins ? Cycle 1 adjuvant gemcitabine/Abraxane 04/25/2018 ? Cycle 2 adjuvant gemcitabine/Abraxane 05/16/2018 ? Cycle 3 adjuvant gemcitabine/Abraxane 05/31/2018 ? Cycle 4 adjuvant gemcitabine/Abraxane 06/13/2018 ? Cycle 5 adjuvant gemcitabine/Abraxane 06/27/2018 ? Cycle 6 adjuvant gemcitabine/Abraxane 07/11/2018  2. 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 Barry Dienes  9. Diarrhea-potentially related to pancreas insufficiency, he began a trial of pancreatic enzyme replacement 05/16/2018: Diarrhea resolved  10. Dehydrationon 03/19/2018-statustreatments with IV fluidsforpast 2 weeks  Admission 04/03/2018 with dehydration and hypotension  Persistent orthostatic hypotension-compression stockings and Florinef added 04/10/2018  Improved   Disposition: Manuel Olson appears stable.  He has completed 5 cycles of adjuvant gemcitabine/Abraxane.  Plan to proceed with the sixth and final cycle today as scheduled.  We reviewed the CBC from today.  Counts  are adequate to proceed with treatment.  He will return for lab, Port-A-Cath flush and follow-up in approximately 1 month.  He will contact the office in the  interim with any problems.  Patient seen with Dr. Benay Spice.    [name] ANP/GNP-BC   07/11/2018  12:26 PM  This was a shared visit with [name].  Manuel Olson will complete a final cycle of adjuvant chemotherapy today.  He is in clinical remission from pancreas cancer.  Julieanne Manson, MD

## 2018-07-11 NOTE — Patient Instructions (Signed)
[facility] Discharge Instructions for Patients Receiving Chemotherapy  Today you received the following chemotherapy agents Abraxane, Gemzar  To help prevent nausea and vomiting after your treatment, we encourage you to take your nausea medication as prescribed.   If you develop nausea and vomiting that is not controlled by your nausea medication, call the clinic.   BELOW ARE SYMPTOMS THAT SHOULD BE REPORTED IMMEDIATELY:  *FEVER GREATER THAN 100.5 F  *CHILLS WITH OR WITHOUT FEVER  NAUSEA AND VOMITING THAT IS NOT CONTROLLED WITH YOUR NAUSEA MEDICATION  *UNUSUAL SHORTNESS OF BREATH  *UNUSUAL BRUISING OR BLEEDING  TENDERNESS IN MOUTH AND THROAT WITH OR WITHOUT PRESENCE OF ULCERS  *URINARY PROBLEMS  *BOWEL PROBLEMS  UNUSUAL RASH Items with * indicate a potential emergency and should be followed up as soon as possible.  Feel free to call the clinic should you have any questions or concerns. The clinic phone number is [phone].  Please show the [facility] at check-in to the Emergency Department and triage nurse.

## 2018-07-12 LAB — CANCER ANTIGEN 19-9: CA 19-9: 8 U/mL (ref 0–35)

## 2018-07-16 DIAGNOSIS — E43 Unspecified severe protein-calorie malnutrition: Secondary | ICD-10-CM | POA: Diagnosis not present

## 2018-07-16 DIAGNOSIS — C642 Malignant neoplasm of left kidney, except renal pelvis: Secondary | ICD-10-CM | POA: Diagnosis not present

## 2018-07-16 DIAGNOSIS — K8681 Exocrine pancreatic insufficiency: Secondary | ICD-10-CM | POA: Diagnosis not present

## 2018-07-16 DIAGNOSIS — C250 Malignant neoplasm of head of pancreas: Secondary | ICD-10-CM | POA: Diagnosis not present

## 2018-07-17 ENCOUNTER — Other Ambulatory Visit: Payer: Self-pay

## 2018-07-17 ENCOUNTER — Other Ambulatory Visit: Payer: Self-pay | Admitting: Oncology

## 2018-07-17 DIAGNOSIS — Z952 Presence of prosthetic heart valve: Secondary | ICD-10-CM

## 2018-07-17 DIAGNOSIS — I350 Nonrheumatic aortic (valve) stenosis: Secondary | ICD-10-CM

## 2018-07-30 ENCOUNTER — Telehealth: Payer: Self-pay | Admitting: *Deleted

## 2018-07-30 NOTE — Telephone Encounter (Signed)
Per Dr. Benay Spice: Contact PCP about this. Cough is not likely related to cancer diagnosis. Wife was notified and stated they do not have a PCP. Will be seeing cardiologist on 11/21 and will let him know about the cough and also see who they suggest for PCP. All they have are specialists. He will continue OTC meds till then.

## 2018-07-30 NOTE — Telephone Encounter (Signed)
Wife called to report patient has had persistent "hard cough of clear productive sputum". Some mild dyspnea during coughing spells. No fever or s/s of respiratory illness. Cough has persisted over at least 2 weeks despite OTC Mucinex, Vicks cough medication and Tylenol cough preparation. Asking if MD needs to see him or can something be called in that is more effective?

## 2018-08-02 ENCOUNTER — Encounter: Payer: Self-pay | Admitting: Cardiology

## 2018-08-02 ENCOUNTER — Ambulatory Visit (HOSPITAL_COMMUNITY)
Admission: RE | Admit: 2018-08-02 | Discharge: 2018-08-02 | Disposition: A | Discharge status: Home / Self Care | Payer: Medicare Other | Source: Ambulatory Visit | Attending: Physician Assistant | Admitting: Physician Assistant

## 2018-08-02 ENCOUNTER — Ambulatory Visit (HOSPITAL_COMMUNITY)
Admission: RE | Admit: 2018-08-02 | Discharge: 2018-08-02 | Disposition: A | Discharge status: Home / Self Care | Payer: Medicare Other | Source: Ambulatory Visit | Attending: Cardiology | Admitting: Cardiology

## 2018-08-02 ENCOUNTER — Ambulatory Visit (INDEPENDENT_AMBULATORY_CARE_PROVIDER_SITE_OTHER): Payer: Medicare Other | Admitting: Cardiology

## 2018-08-02 VITALS — BP 134/84 | HR 94 | Ht 69.0 in | Wt 132.0 lb

## 2018-08-02 DIAGNOSIS — I350 Nonrheumatic aortic (valve) stenosis: Secondary | ICD-10-CM | POA: Insufficient documentation

## 2018-08-02 DIAGNOSIS — Z952 Presence of prosthetic heart valve: Secondary | ICD-10-CM

## 2018-08-02 DIAGNOSIS — R918 Other nonspecific abnormal finding of lung field: Secondary | ICD-10-CM | POA: Insufficient documentation

## 2018-08-02 DIAGNOSIS — R05 Cough: Secondary | ICD-10-CM | POA: Insufficient documentation

## 2018-08-02 DIAGNOSIS — R053 Chronic cough: Secondary | ICD-10-CM

## 2018-08-02 DIAGNOSIS — I25119 Atherosclerotic heart disease of native coronary artery with unspecified angina pectoris: Secondary | ICD-10-CM

## 2018-08-02 DIAGNOSIS — I255 Ischemic cardiomyopathy: Secondary | ICD-10-CM

## 2018-08-02 DIAGNOSIS — I10 Essential (primary) hypertension: Secondary | ICD-10-CM | POA: Diagnosis not present

## 2018-08-02 MED ORDER — LOSARTAN POTASSIUM 25 MG PO TABS: 12.5000 mg | ORAL_TABLET | Freq: Every day | ORAL | 3 refills | Status: DC

## 2018-08-02 NOTE — Progress Notes (Signed)
Cardiology Office Note  Date: 08/02/2018   ID: Manuel Olson, DOB 09-01-1946, MRN [id number]  PCP: Patient, No Pcp Per  Primary Cardiologist: Rozann Lesches, MD   Chief Complaint  Patient presents with  . Cardiac follow-up    History of Present Illness: Manuel Olson is a 72 y.o. male last seen in October.  He is here today with his wife for a follow-up visit.  He tells me that he has completed all chemotherapy, is in an observation phase at this time.  His appetite is improved and he has gained a few pounds, still looks chronically ill with muscle wasting.  He does not report any exertional chest pain.  Taken altogether his functional class is NYHA class III, but this is multifactorial in the setting of fairly complex comorbid illnesses.  He had an echocardiogram done in follow-up of TAVR earlier today, report is pending my review.  He does complain of a persistent cough for the last few weeks, no fevers or chills.  Reports mucus production and a feeling of chest congestion.  He has not had recent chest imaging.  I reviewed his home blood pressure checks.  Trend has been going up, he has not had any hypotension.  Current cardiac regimen as outlined below.  We discussed instead of starting back on lisinopril at low-dose, we will put him on Cozaar, particularly with his recent coughing.  Past Medical History:  Diagnosis Date  . Arthritis   . CAD (coronary artery disease)    a. 11/2016 in [place]: STEMI s/p DES to mid LAD, DES to diagonal, DES x 2 to proximal and mid RCA   . Essential hypertension   . Former smoker   . GERD (gastroesophageal reflux disease)   . History of stroke   . Hyperlipidemia   . Myocardial infarction ([place])   . Pancreatic cancer ([place])   . Pneumonia   . Renal mass   . S/P TAVR (transcatheter aortic valve replacement)    a. 08/2017:  Edwards Sapien 3 THV (size 26 mm, model #9600CM26A , serial # [id number])  . Severe aortic stenosis    a. 08/2017: s/p  TAVR by Dr. Angelena Form and Dr. Cyndia Bent.   . Type 2 diabetes mellitus ([place])     Past Surgical History:  Procedure Laterality Date  . APPENDECTOMY    . CARDIAC CATHETERIZATION    . EUS N/A 08/02/2017   Procedure: UPPER ENDOSCOPIC ULTRASOUND (EUS) RADIAL;  Surgeon: [name], MD;  Location: [facility];  Service: Endoscopy;  Laterality: N/A;  . EYE SURGERY Left   . HAND SURGERY Bilateral   . HERNIA REPAIR Right   . LAPAROSCOPY N/A 02/19/2018   Procedure: DIAGNOSTIC LAPAROSCOPY, ERAS PATHWAY;  Surgeon: [name], MD;  Location: [facility];  Service: General;  Laterality: N/A;  . NEPHRECTOMY Left 02/19/2018   Procedure: OPEN LEFT RADICAL NEPHRECTOMY;  Surgeon: Cleon Gustin, MD;  Location: [facility];  Service: Urology;  Laterality: Left;  . PORTACATH PLACEMENT N/A 09/15/2017   Procedure: INSERTION PORT-A-CATH;  Surgeon: [name], MD;  Location: [facility];  Service: General;  Laterality: N/A;  . RIGHT/LEFT HEART CATH AND CORONARY ANGIOGRAPHY N/A 07/05/2017   Procedure: RIGHT/LEFT HEART CATH AND CORONARY ANGIOGRAPHY;  Surgeon: [name], MD;  Location: [facility];  Service: Cardiovascular;  Laterality: N/A;  . SHOULDER ARTHROSCOPY WITH ROTATOR CUFF REPAIR Left   . TEE WITHOUT CARDIOVERSION N/A 08/22/2017   Procedure: TRANSESOPHAGEAL ECHOCARDIOGRAM (TEE);  Surgeon: Angelena Form,  Annita Brod, MD;  Location: [facility];  Service: Open Heart Surgery;  Laterality: N/A;  . TRANSCATHETER AORTIC VALVE REPLACEMENT, TRANSFEMORAL N/A 08/22/2017   Procedure: TRANSCATHETER AORTIC VALVE REPLACEMENT, TRANSFEMORAL;  Surgeon: Burnell Blanks, MD;  Location: [facility];  Service: Open Heart Surgery;  Laterality: N/A;  . WHIPPLE PROCEDURE N/A 02/19/2018   Procedure: WHIPPLE PROCEDURE;  Surgeon: [name], MD;  Location: [facility];  Service: General;  Laterality: N/A;    Current Outpatient Medications  Medication Sig Dispense Refill  . atorvastatin (LIPITOR) 20 MG tablet Take 1  tablet (20 mg total) by mouth daily. 90 tablet 1  . clopidogrel (PLAVIX) 75 MG tablet TAKE 1 TABLET BY MOUTH ONCE DAILY. 30 tablet 6  . CREON 36000 units CPEP capsule TAKE 1 CAPSULE BY MOUTH BEFORE EACH MEAL AND SNACK. TAKE UP TO 6 CAPSULES PER DAY. 180 capsule 4  . fludrocortisone (FLORINEF) 0.1 MG tablet TAKE 1 TABLET BY MOUTH ONCE DAILY. 30 tablet 3  . Insulin Glargine (BASAGLAR KWIKPEN) 100 UNIT/ML SOPN Inject 0.14 mLs (14 Units total) into the skin at bedtime. 5 pen 5  . insulin lispro (HUMALOG KWIKPEN) 100 UNIT/ML KiwkPen Inject 0.04-0.06 mLs (4-6 Units total) into the skin 3 (three) times daily. 15 mL 5  . Insulin Pen Needle (PEN NEEDLES) 31G X 6 MM MISC Use as directed. 100 each 0  . lidocaine-prilocaine (EMLA) cream lidocaine-prilocaine 2.5 %-2.5 % topical cream    . pantoprazole (PROTONIX) 40 MG tablet Take 1 tablet (40 mg total) by mouth daily at 12 noon. 30 tablet 3  . losartan (COZAAR) 25 MG tablet Take 0.5 tablets (12.5 mg total) by mouth daily. 45 tablet 3   No current facility-administered medications for this visit.    Allergies:  Patient has no known allergies.   Social History: The patient  reports that he quit smoking about 2 years ago. His smoking use included cigarettes. He started smoking about 59 years ago. He has a 57.00 pack-year smoking history. He has never used smokeless tobacco. He reports that he does not drink alcohol or use drugs.   ROS:  Please see the history of present illness. Otherwise, complete review of systems is positive for fatigue.  All other systems are reviewed and negative.   Physical Exam: VS:  BP 134/84   Pulse 94   Ht 5' 9"  (1.753 m)   Wt 132 lb (59.9 kg)   SpO2 96%   BMI 19.49 kg/m , BMI Body mass index is 19.49 kg/m.  Wt Readings from Last 3 Encounters:  08/02/18 132 lb (59.9 kg)  07/11/18 129 lb 6.4 oz (58.7 kg)  06/27/18 128 lb 8 oz (58.3 kg)    General: Chronically ill-appearing male in no distress. HEENT: Conjunctiva and lids  normal, oropharynx clear. Neck: Supple, no elevated JVP or carotid bruits, no thyromegaly. Lungs: Coarse breath sounds without wheezing, nonlabored breathing at rest. Cardiac: Regular rate and rhythm, no S3, soft systolic murmur, no pericardial rub. Abdomen: Soft, nontender, bowel sounds present. Extremities: No pitting edema, distal pulses 2+. Skin: Warm and dry. Musculoskeletal: Muscle wasting. Neuropsychiatric: Alert and oriented x3, affect grossly appropriate.  ECG: I personally reviewed the tracing from 08/31/2017 which showed sinus rhythm with prolonged PR interval, poor R wave progression consistent with old anterior infarct pattern.  Recent Labwork: 08/17/2017: B Natriuretic Peptide 163.2 04/04/2018: TSH 1.267 07/11/2018: ALT 26; AST 15; BUN 27; Creatinine 1.49; Hemoglobin 8.7; Magnesium 1.8; Platelet Count 160; Potassium 4.0; Sodium 137  Component Value Date/Time   CHOL 102 03/16/2017 0840   TRIG 71 03/16/2017 0840   HDL 36 (L) 03/16/2017 0840   CHOLHDL 2.8 03/16/2017 0840   VLDL 14 03/16/2017 0840   LDLCALC 52 03/16/2017 0840    Other Studies Reviewed Today:  Echocardiogram 09/13/2017: Study Conclusions  - Left ventricle: The cavity size was normal. There was mild concentric hypertrophy. Systolic function was mildly reduced. The estimated ejection fraction was in the range of 45% to 50%. Wall motion was normal; there were no regional wall motion abnormalities. Doppler parameters are consistent with abnormal left ventricular relaxation (grade 1 diastolic dysfunction). Doppler parameters are consistent with high ventricular filling pressure. - Aortic valve: A 55m Edwards Sapien TAVR bioprosthesis was present. There was mild stenosis. There was mild perivalvular regurgitation. Peak velocity (S): 296 cm/s. Mean gradient (S): 16 mm Hg. - Mitral valve: Transvalvular velocity was within the normal range. There was no evidence for stenosis. There  was no regurgitation. - Right ventricle: The cavity size was normal. Wall thickness was normal. Systolic function was normal. - Atrial septum: No defect or patent foramen ovale was identified by color flow Doppler.  Assessment and Plan:  1.  Severe aortic stenosis status post TAVR in December 2018.  Plan to follow-up on echocardiogram done earlier today.  Last study showed mild perivalvular regurgitation.  2.  Essential hypertension with intermittent orthostasis.  Symptoms have improved in general and his blood pressure trend is going back up.  We will place him on Cozaar 12.5 mg daily instead of low-dose lisinopril.  May be able to wean off Florinef eventually.  3.  Cardiomyopathy, LVEF 45 to 50% range by last echocardiogram.  4.  Multivessel CAD status post anterior STEMI in 2018 with DES to the LAD, DES to diagonal, and DES x2 to the RCA.  No active angina symptoms at this time.  5.  Persistent cough over the last few weeks.  PA and lateral chest x-ray will be obtained.  Current medicines were reviewed with the patient today.   Orders Placed This Encounter  Procedures  . DG Chest 2 View  . Basic Metabolic Panel (BMET)     Disposition: Follow-up in 8 weeks.  Signed, SSatira Sark MD, FStarpoint Surgery [facility] LP11/21/2019 1:36 PM    [facility] [place] [address]Phone: [phone] Fax: [phone]

## 2018-08-02 NOTE — Patient Instructions (Signed)
Medication Instructions:  STOP Lisinopril   START Cozaar 12.5 mg daily If you need a refill on your cardiac medications before your next appointment, please call your pharmacy.   Lab work: BMET in 2 weeks ( 08/23/18) If you have labs (blood work) drawn today and your tests are completely normal, you will receive your results only by: [name] MyChart Message (if you have MyChart) OR . A paper copy in the mail If you have any lab test that is abnormal or we need to change your treatment, we will call you to review the results.  Testing/Procedures: Chest X-ray TODAY  Follow-Up: At [facility], you and your health needs are our priority.  As part of our continuing mission to provide you with exceptional heart care, we have created designated Provider Care Teams.  These Care Teams include your primary Cardiologist (physician) and Advanced Practice Providers (APPs -  Physician Assistants and Nurse Practitioners) who all work together to provide you with the care you need, when you need it. You will need a follow up appointment in 8 weeks.  Please call our office 2 months in advance to schedule this appointment.  You may see Rozann Lesches, MD or one of the following Advanced Practice Providers on your designated Care Team:   Bernerd Pho, PA-C [facility]) . Ermalinda Barrios, PA-C ([place])  Any Other Special Instructions Will Be Listed Below (If Applicable). NONE

## 2018-08-02 NOTE — Progress Notes (Signed)
*  PRELIMINARY RESULTS* Echocardiogram 2D Echocardiogram has been performed.  Manuel Olson 08/02/2018, 11:17 AM

## 2018-08-03 ENCOUNTER — Telehealth: Payer: Self-pay

## 2018-08-03 MED ORDER — AZITHROMYCIN 250 MG PO TABS: ORAL_TABLET | ORAL | 0 refills | Status: DC

## 2018-08-03 MED ORDER — AMOXICILLIN-POT CLAVULANATE 875-125 MG PO TABS: 1.0000 | ORAL_TABLET | Freq: Two times a day (BID) | ORAL | 0 refills | Status: DC

## 2018-08-03 NOTE — Telephone Encounter (Signed)
I spoke with wife, they will pick up antibiotics and will drop off BP readings next week, copied pcp

## 2018-08-03 NOTE — Telephone Encounter (Signed)
-----   Message from Satira Sark, MD sent at 08/03/2018  8:35 AM EST ----- Results reviewed.  Chest x-ray shows a fairly small left basilar opacity that although could be secondary to prior scarring or just atelectasis, pneumonia cannot be excluded.  With patient's persistent cough over the last few weeks we should likely treat this empirically.  He does not have a PCP at this time to direct therapy, and his oncology office apparently deferred this to me.  Would start a 5-day course of Augmentin 875/125 mg every 12 hours as well as azithromycin 500 mg on day 1 and 250 mg for the remaining 4 days.  This should cover typical and atypical bacteria in the community setting.  He has not had any QT prolongation to have major concern with using azithromycin. A copy of this test should be forwarded to Patient, No Pcp Per.

## 2018-08-05 ENCOUNTER — Other Ambulatory Visit: Payer: Self-pay | Admitting: Oncology

## 2018-08-07 ENCOUNTER — Inpatient Hospital Stay: Payer: Medicare Other | Attending: Oncology

## 2018-08-07 ENCOUNTER — Telehealth: Payer: Self-pay | Admitting: Oncology

## 2018-08-07 ENCOUNTER — Inpatient Hospital Stay: Payer: Medicare Other

## 2018-08-07 ENCOUNTER — Inpatient Hospital Stay (HOSPITAL_BASED_OUTPATIENT_CLINIC_OR_DEPARTMENT_OTHER): Payer: Medicare Other | Admitting: Oncology

## 2018-08-07 VITALS — BP 146/86 | HR 83 | Temp 97.7°F | Resp 18 | Ht 69.0 in | Wt 131.5 lb

## 2018-08-07 DIAGNOSIS — I10 Essential (primary) hypertension: Secondary | ICD-10-CM | POA: Insufficient documentation

## 2018-08-07 DIAGNOSIS — I9589 Other hypotension: Secondary | ICD-10-CM | POA: Diagnosis not present

## 2018-08-07 DIAGNOSIS — E119 Type 2 diabetes mellitus without complications: Secondary | ICD-10-CM | POA: Diagnosis not present

## 2018-08-07 DIAGNOSIS — D6481 Anemia due to antineoplastic chemotherapy: Secondary | ICD-10-CM | POA: Diagnosis not present

## 2018-08-07 DIAGNOSIS — J069 Acute upper respiratory infection, unspecified: Secondary | ICD-10-CM | POA: Insufficient documentation

## 2018-08-07 DIAGNOSIS — C642 Malignant neoplasm of left kidney, except renal pelvis: Secondary | ICD-10-CM | POA: Insufficient documentation

## 2018-08-07 DIAGNOSIS — C250 Malignant neoplasm of head of pancreas: Secondary | ICD-10-CM

## 2018-08-07 DIAGNOSIS — D630 Anemia in neoplastic disease: Secondary | ICD-10-CM

## 2018-08-07 DIAGNOSIS — I252 Old myocardial infarction: Secondary | ICD-10-CM | POA: Insufficient documentation

## 2018-08-07 DIAGNOSIS — T451X5A Adverse effect of antineoplastic and immunosuppressive drugs, initial encounter: Secondary | ICD-10-CM | POA: Diagnosis not present

## 2018-08-07 DIAGNOSIS — Z95828 Presence of other vascular implants and grafts: Secondary | ICD-10-CM

## 2018-08-07 LAB — CMP (CANCER CENTER ONLY)
ALT: 33 U/L (ref 0–44)
AST: 26 U/L (ref 15–41)
Albumin: 3.3 g/dL — ABNORMAL LOW (ref 3.5–5.0)
Alkaline Phosphatase: 98 U/L (ref 38–126)
Anion gap: 10 (ref 5–15)
BUN: 29 mg/dL — ABNORMAL HIGH (ref 8–23)
CO2: 25 mmol/L (ref 22–32)
Calcium: 8.9 mg/dL (ref 8.9–10.3)
Chloride: 103 mmol/L (ref 98–111)
Creatinine: 1.42 mg/dL — ABNORMAL HIGH (ref 0.61–1.24)
GFR, Est AFR Am: 56 mL/min — ABNORMAL LOW (ref >60)
GFR, Estimated: 48 mL/min — ABNORMAL LOW (ref >60)
Glucose, Bld: 192 mg/dL — ABNORMAL HIGH (ref 70–99)
Potassium: 3.8 mmol/L (ref 3.5–5.1)
Sodium: 138 mmol/L (ref 135–145)
Total Bilirubin: 0.3 mg/dL (ref 0.3–1.2)
Total Protein: 6.6 g/dL (ref 6.5–8.1)

## 2018-08-07 LAB — CBC WITH DIFFERENTIAL (CANCER CENTER ONLY)
Abs Immature Granulocytes: 0.03 10*3/uL (ref 0.00–0.07)
Basophils Absolute: 0 10*3/uL (ref 0.0–0.1)
Basophils Relative: 0 %
Eosinophils Absolute: 0.1 10*3/uL (ref 0.0–0.5)
Eosinophils Relative: 1 %
HCT: 28.5 % — ABNORMAL LOW (ref 39.0–52.0)
Hemoglobin: 8.9 g/dL — ABNORMAL LOW (ref 13.0–17.0)
Immature Granulocytes: 0 %
Lymphocytes Relative: 15 %
Lymphs Abs: 1.4 10*3/uL (ref 0.7–4.0)
MCH: 30 pg (ref 26.0–34.0)
MCHC: 31.2 g/dL (ref 30.0–36.0)
MCV: 96 fL (ref 80.0–100.0)
Monocytes Absolute: 1 10*3/uL (ref 0.1–1.0)
Monocytes Relative: 11 %
Neutro Abs: 6.7 10*3/uL (ref 1.7–7.7)
Neutrophils Relative %: 73 %
Platelet Count: 223 10*3/uL (ref 150–400)
RBC: 2.97 MIL/uL — ABNORMAL LOW (ref 4.22–5.81)
RDW: 15.8 % — ABNORMAL HIGH (ref 11.5–15.5)
WBC Count: 9.3 10*3/uL (ref 4.0–10.5)
nRBC: 0 % (ref 0.0–0.2)

## 2018-08-07 LAB — MAGNESIUM: Magnesium: 2 mg/dL (ref 1.7–2.4)

## 2018-08-07 MED ORDER — HEPARIN SOD (PORK) LOCK FLUSH 100 UNIT/ML IV SOLN
500.0000 [IU] | Freq: Once | INTRAVENOUS | Status: AC
  Administered 2018-08-07: 500 [IU]
  Filled 2018-08-07: qty 5

## 2018-08-07 MED ORDER — SODIUM CHLORIDE 0.9% FLUSH
10.0000 mL | Freq: Once | INTRAVENOUS | Status: AC
  Administered 2018-08-07: 10 mL
  Filled 2018-08-07: qty 10

## 2018-08-07 NOTE — Telephone Encounter (Signed)
Gave pt avs and calendar  °

## 2018-08-07 NOTE — Progress Notes (Signed)
[facility] OFFICE PROGRESS NOTE   Diagnosis: Pancreas cancer  INTERVAL HISTORY:   Manuel Olson returns for a scheduled visit.  He developed a productive cough earlier this week.  He was seen in cardiology.  A chest x-ray revealed left basilar atelectasis or scarring.  Pneumonia could not be excluded.  He was placed on azithromycin and Augmentin.  He reports the cough has resolved.  No fever. Diarrhea improved with Creon.  He completed a final treatment with adjuvant gemcitabine/Abraxane on 07/11/2018. Objective:  Vital signs in last 24 hours:  Blood pressure (!) 146/86, pulse 83, temperature 97.7 F (36.5 C), temperature source Oral, resp. rate 18, height 5' 9"  (1.753 m), weight 131 lb 8 oz (59.6 kg), SpO2 98 %.    HEENT: Neck without mass Lymphatics: No cervical or supraclavicular nodes Resp: Lungs clear bilaterally, no respiratory distress Cardio: Regular rate and rhythm GI: Nontender, no hepatosplenomegaly Vascular: No leg edema  Portacath/PICC-without erythema  Lab Results:  Lab Results  Component Value Date   WBC 9.3 08/07/2018   HGB 8.9 (L) 08/07/2018   HCT 28.5 (L) 08/07/2018   MCV 96.0 08/07/2018   PLT 223 08/07/2018   NEUTROABS 6.7 08/07/2018    CMP  Lab Results  Component Value Date   NA 138 08/07/2018   K 3.8 08/07/2018   CL 103 08/07/2018   CO2 25 08/07/2018   GLUCOSE 192 (H) 08/07/2018   BUN 29 (H) 08/07/2018   CREATININE 1.42 (H) 08/07/2018   CALCIUM 8.9 08/07/2018   PROT 6.6 08/07/2018   ALBUMIN 3.3 (L) 08/07/2018   AST 26 08/07/2018   ALT 33 08/07/2018   ALKPHOS 98 08/07/2018   BILITOT 0.3 08/07/2018   GFRNONAA 48 (L) 08/07/2018   GFRAA 56 (L) 08/07/2018    No results found for: CEA1  Lab Results  Component Value Date   INR 1.03 02/09/2018    Imaging:  No results found.  Medications: I have reviewed the patient's current medications.   Assessment/Plan: 1. Adenocarcinoma of the pancreas head/neck, status post  an EUS biopsy 08/02/2017 confirming adenocarcinoma ? EUS 08/02/2017-pancreas head/neck mass, abutment of the portal vein, no peripancreatic adenopathy ? MRI abdomen 07/27/2017-head/neck pancreas mass with mass-effect on the portal splenic confluence, no involvement of the celiac axis or superior mesenteric artery, no evidence of metastatic disease ? PET scan 08/31/2017-very hypermetabolism at the pancreatic head mass, no evidence of metastatic disease. The isolated right lower lobe nodule below PET resolution, left sided renal mass with decreased activity relative to the normal adjacent kidney ? Cycle 1 FOLFIRINOX 09/16/2017 ? Cycle 2 FOLFOX 10/16/2017 ? Cycle 3 FOLFOX 10/30/2017 ? Cycle 4 FOLFIRINOX 11/14/2017 (irinotecan resumed with a dose reduction) ? Cycle 5 FOLFIRINOX3/20/2019 (Oxaliplatin dose reduced due to thrombocytopenia) ? Cycle 6 FOLFIRINOX4/11/2017 ? CTs 12/25/2017-stable right lower lobe nodule, decrease in size of the pancreas head mass, stable left kidney mass ? Pancreaticoduodenectomy and left nephrectomy 02/19/2018,[id number], 2/18 lymph nodes positive, lymphovascular and perineural invasion present, mild to moderate treatment effect, tumor involved adherent portal vein with negative portal vein margins ? Cycle 1 adjuvant gemcitabine/Abraxane 04/25/2018 ? Cycle 2 adjuvant gemcitabine/Abraxane 05/16/2018 ? Cycle 3 adjuvant gemcitabine/Abraxane 05/31/2018 ? Cycle 4 adjuvant gemcitabine/Abraxane 06/13/2018 ? Cycle 5 adjuvant gemcitabine/Abraxane 06/27/2018 ? Cycle 6 adjuvant gemcitabine/Abraxane 07/11/2018  2. Left renal mass consistent with a renal cell carcinoma seen on CT 07/21/2017 and MRI 07/28/2017  Left nephrectomy 02/19/2018-6 cm clear cell carcinoma, Fuhrman grade 2, negative resection margins  3.Severe aortic stenosis-TAVR12/07/2017  4.Coronary  artery disease, status post myocardial infarction March 2018, status post placement of coronary  stents  5.Diabetes  6.History of tobacco use  7.Hypertension  8.Port-A-Cath placement 09/15/2017, Dr. Barry Dienes  9. Diarrhea-potentially related to pancreas insufficiency, he began a trial of pancreatic enzyme replacement 05/16/2018: Diarrhea resolved  10. Dehydrationon 03/19/2018-statustreatments with IV fluidsforpast 2 weeks  Admission 04/03/2018 with dehydration and hypotension  Persistent orthostatic hypotension-compression stockings and Florinef added 04/10/2018 improved  11.  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  He has completed adjuvant chemotherapy.  He will return for an office visit, Port-A-Cath flush, and CA 19-9 in 6 weeks.  We made referral to [facility] internal medicine to establish with a primary care physician.  He is recovering from a recent upper respiratory infection, likely viral.  [name], MD  08/07/2018  1:23 PM

## 2018-08-20 ENCOUNTER — Telehealth: Payer: Self-pay | Admitting: Internal Medicine

## 2018-08-20 NOTE — Telephone Encounter (Signed)
Yes, I believe that they are checking 4 times a day to be able to qualify for the CGM.

## 2018-08-20 NOTE — Telephone Encounter (Signed)
Does this sound familiar?

## 2018-08-20 NOTE — Telephone Encounter (Signed)
Please advise, I have not seen this.

## 2018-08-20 NOTE — Telephone Encounter (Signed)
Patients wife has called in regards to dropping off her husbands readings last month to be sent off for it to be covered by Medicare. Please Advise with wife, thanks

## 2018-08-21 ENCOUNTER — Ambulatory Visit (INDEPENDENT_AMBULATORY_CARE_PROVIDER_SITE_OTHER): Payer: Medicare Other | Admitting: Family Medicine

## 2018-08-21 ENCOUNTER — Encounter: Payer: Self-pay | Admitting: Family Medicine

## 2018-08-21 VITALS — BP 128/80 | HR 83 | Temp 98.4°F | Ht 69.0 in | Wt 129.4 lb

## 2018-08-21 DIAGNOSIS — I10 Essential (primary) hypertension: Secondary | ICD-10-CM | POA: Diagnosis not present

## 2018-08-21 DIAGNOSIS — C641 Malignant neoplasm of right kidney, except renal pelvis: Secondary | ICD-10-CM | POA: Diagnosis not present

## 2018-08-21 DIAGNOSIS — E782 Mixed hyperlipidemia: Secondary | ICD-10-CM | POA: Diagnosis not present

## 2018-08-21 DIAGNOSIS — E1165 Type 2 diabetes mellitus with hyperglycemia: Secondary | ICD-10-CM | POA: Diagnosis not present

## 2018-08-21 DIAGNOSIS — C250 Malignant neoplasm of head of pancreas: Secondary | ICD-10-CM | POA: Diagnosis not present

## 2018-08-21 DIAGNOSIS — E1159 Type 2 diabetes mellitus with other circulatory complications: Secondary | ICD-10-CM

## 2018-08-21 DIAGNOSIS — E43 Unspecified severe protein-calorie malnutrition: Secondary | ICD-10-CM

## 2018-08-21 DIAGNOSIS — R5381 Other malaise: Secondary | ICD-10-CM | POA: Diagnosis not present

## 2018-08-21 DIAGNOSIS — I2510 Atherosclerotic heart disease of native coronary artery without angina pectoris: Secondary | ICD-10-CM | POA: Diagnosis not present

## 2018-08-21 NOTE — Assessment & Plan Note (Signed)
BMI 19.11 today.  Continue high-protein diet.  Likely have some improvement in weight now that he is finished with chemotherapy.

## 2018-08-21 NOTE — Progress Notes (Signed)
Subjective:  Manuel Olson is a 72 y.o. male who presents today with a chief complaint of pancreatic cancer status post Whipple procedure and to establish care  HPI:  He has no acute complaints today.  His stable, chronic medical conditions are outlined below:  1. Pancreatic Cancer s/p Whipple Procedure.  Diagnosed about a year ago as part of work-up for his aortic stenosis.  Underwent Whipple's procedure about 6 months ago.  Recently completed course of chemotherapy.  Recovering well.  Follows with oncology.   2. AS s/p TAVR.  Underwent operation about a year ago.  Follows with cardiology.  Currently on Plavix 75 mg daily. 3. T2DM. Follows with endocrine.  On insulin. 4. CAD/HLD.  Follows with cardiology.  On Plavix and Lipitor. 5. HTN with orthostasis. Follows with cardiology. On losartan 12.2m daily and florinef 0.135mdaily.  Symptoms seem to be well controlled. 6.  Renal cell carcinoma status post nephrectomy.   ROS: Per HPI, otherwise a complete review of systems was negative.   PMH:  The following were reviewed and entered/updated in epic: Past Medical History:  Diagnosis Date  . Arthritis   . CAD (coronary artery disease)    a. 11/2016 in [place] s/p DES to mid LAD, DES to diagonal, DES x 2 to proximal and mid RCA   . Essential hypertension   . Former smoker   . GERD (gastroesophageal reflux disease)   . History of stroke   . Hyperlipidemia   . [place]ocardial infarction ([place]  . Pancreatic cancer ([facility] [place]  . Pneumonia   . Renal mass   . S/P TAVR (transcatheter aortic valve replacement)    a. 08/2017:  Edwards Sapien 3 THV (size 26 mm, model #9600CM26A , serial # [id number] . Severe aortic stenosis    a. 08/2017: s/p TAVR by Dr. McAngelena Formnd Dr. BaCyndia Bent  . Type [facility] diabetes mellitus ([facility]   Patient Active Problem List   Diagnosis Date Noted  . Protein-calorie malnutrition, severe 04/05/2018  . Debility 02/27/2018  . Port-A-Cath in place 10/10/2017  . [facility]  [facility]/p nephrectomy 02/2018 10/02/2017  . Ileitis, terminal ([facility]01/17/2019  . Poorly controlled type 2 diabetes mellitus with circulatory disorder (HCOntonagon12/14/2018  . Mixed hyperlipidemia 08/25/2017  . Essential hypertension, benign   . CAD (coronary artery disease)   . Former smoker   . Severe AS S/P TAVR  08/22/2017  . Cancer [facility] of pancreas ([facility]/p Whipple 02/2018 08/10/2017   Past Surgical History:  Procedure Laterality Date  . APPENDECTOMY    . CARDIAC CATHETERIZATION    . EUS N/A 08/02/2017   Procedure: UPPER ENDOSCOPIC ULTRASOUND (EUS) RADIAL;  Surgeon: JaMilus BanisterMD;  [facility];  Service: Endoscopy;  Laterality: N/A;  . EYE SURGERY Left   . HAND SURGERY Bilateral   . HERNIA REPAIR Right   . LAPAROSCOPY N/A 02/19/2018   Procedure: DIAGNOSTIC LAPAROSCOPY, ERAS PATHWAY;  Surgeon: ByStark KleinMD;  [facility] Service: General;  Laterality: N/A;  . NEPHRECTOMY Left 02/19/2018   Procedure: OPEN LEFT RADICAL NEPHRECTOMY;  Surgeon: McCleon GustinMD;  [place] [facility] Service: Urology;  Laterality: Left;  . PORTACATH PLACEMENT N/A 09/15/2017   Procedure: INSERTION PORT-A-CATH;  Surgeon: ByStark KleinMD;  Location: [place] Service: General;  Laterality: N/A;  . RIGHT/LEFT HEART CATH AND CORONARY ANGIOGRAPHY N/A 07/05/2017   Procedure: RIGHT/LEFT HEART CATH AND  CORONARY ANGIOGRAPHY;  Surgeon: [name], MD;  Location: [place] CV LAB;  Service: Cardiovascular;  Laterality: N/A;  . SHOULDER ARTHROSCOPY WITH ROTATOR CUFF REPAIR Left   . TEE WITHOUT CARDIOVERSION N/A 08/22/2017   Procedure: TRANSESOPHAGEAL ECHOCARDIOGRAM (TEE);  Surgeon: Burnell Blanks, MD;  Location: [facility];  Service: Open Heart Surgery;  Laterality: N/A;  . TRANSCATHETER AORTIC VALVE REPLACEMENT, TRANSFEMORAL N/A 08/22/2017   Procedure: TRANSCATHETER AORTIC VALVE REPLACEMENT, TRANSFEMORAL;  Surgeon: Burnell Blanks, MD;  Location: [place] [facility];  Service: Open Heart Surgery;  Laterality: N/A;  . WHIPPLE PROCEDURE N/A 02/19/2018   Procedure: WHIPPLE PROCEDURE;  Surgeon: [name], MD;  [facility];  Service: General;  Laterality: N/A;    Family History  Problem Relation Age of Onset  . Lupus Mother   . CAD Brother   . Hypertension Brother     Medications- reviewed and updated Current Outpatient Medications  Medication Sig Dispense Refill  . atorvastatin (LIPITOR) 20 MG tablet Take 20 mg by mouth daily.    . clopidogrel (PLAVIX) 75 MG tablet TAKE 1 TABLET BY MOUTH ONCE DAILY. 30 tablet 6  . CREON 36000 units CPEP capsule TAKE 1 CAPSULE BY MOUTH BEFORE EACH MEAL AND SNACK. TAKE UP TO 6 CAPSULES PER DAY. 180 capsule 4  . fludrocortisone (FLORINEF) 0.1 MG tablet TAKE 1 TABLET BY MOUTH ONCE DAILY. 30 tablet 3  . Insulin Glargine (BASAGLAR KWIKPEN) 100 UNIT/ML SOPN Inject 0.14 mLs (14 Units total) into the skin at bedtime. 5 pen 5  . insulin lispro (HUMALOG KWIKPEN) 100 UNIT/ML KiwkPen Inject 0.04-0.06 mLs (4-6 Units total) into the skin 3 (three) times daily. 15 mL 5  . Insulin Pen Needle (PEN NEEDLES) 31G X 6 MM MISC Use as directed. 100 each 0  . lidocaine-prilocaine (EMLA) cream lidocaine-prilocaine 2.5 %-2.5 % topical cream    . losartan (COZAAR) 25 MG tablet Take 0.5 tablets (12.5 mg total) by mouth daily. 45 tablet 3   No current facility-administered medications for this visit.     Allergies-reviewed and updated No Known Allergies  Social History   Socioeconomic History  . Marital status: Married    Spouse name: Not on file  . Number of children: Not on file  . Years of education: Not on file  . Highest education level: Not on file  Occupational History  . Not on file  Social Needs  . Financial resource strain: Not on file  . Food insecurity:    Worry: Not on file    Inability: Not on file  . Transportation needs:    Medical: Not on file    Non-medical: Not on file    Tobacco Use  . Smoking status: Former Smoker    Packs/day: 1.00    Years: 57.00    Pack years: 57.00    Types: Cigarettes    Start date: 09/12/1958    Last attempt to quit: 04/12/2016    Years since quitting: 2.3  . Smokeless tobacco: Never Used  Substance and Sexual Activity  . Alcohol use: No  . Drug use: No  . Sexual activity: Not on file  Lifestyle  . Physical activity:    Days per week: Not on file    Minutes per session: Not on file  . Stress: Not on file  Relationships  . Social connections:    Talks on phone: Not on file    Gets together: Not on file    Attends religious service: Not on file  Active member of club or organization: Not on file    Attends meetings of clubs or organizations: Not on file    Relationship status: Not on file  Other Topics Concern  . Not on file  Social History Narrative  . Not on file    Objective:  Physical Exam: BP 128/80 (BP Location: Right Arm, Patient Position: Sitting, Cuff Size: Normal)   Pulse 83   Temp 98.4 F (36.9 C) (Oral)   Ht 5' 9"  (1.753 m)   Wt 129 lb 6.4 oz (58.7 kg)   SpO2 98%   BMI 19.11 kg/m   Wt Readings from Last 3 Encounters:  08/21/18 129 lb 6.4 oz (58.7 kg)  08/07/18 131 lb 8 oz (59.6 kg)  08/02/18 132 lb (59.9 kg)  Gen: NAD, resting comfortably CV: RRR with no murmurs appreciated Pulm: NWOB, CTAB with no crackles, wheezes, or rhonchi GI: Normal bowel sounds present. Soft, Nontender, Nondistended. MSK: No edema, cyanosis, or clubbing noted Skin: Warm, dry Neuro: Grossly normal, moves all extremities Psych: Normal affect and thought content  Assessment/Plan:  Renal cell carcinoma of right kidney ([place]) s/p nephrectomy 02/2018 Stable.  Renal function checked 2 weeks ago and was stable.   Protein-calorie malnutrition, severe BMI 19.11 today.  Continue high-protein diet.  Likely have some improvement in weight now that he is finished with chemotherapy.  Poorly controlled type 2 diabetes mellitus  with circulatory disorder (HCC) On Basaglar 14 units daily and lispro 4 to 6 units 3 times daily.  Managed by endocrinology.  Mixed hyperlipidemia Stable.  Continue Lipitor 10 mg daily.  CAD (coronary artery disease) Stable.  Follows with cardiology.  Continue Plavix and statin.  Essential hypertension, benign At goal.  Continue losartan 12.5 mg daily.  He is on Florinef for orthostasis.  Hopefully will be able to wean off soon now that he is finished with chemotherapy.  This is managed by cardiology.   Cancer of head of pancreas ([place]) s/p Whipple 02/2018 Stable.  Continue Creon.  Managed by oncology.   Algis Greenhouse. [name], MD 08/21/2018 12:39 PM

## 2018-08-21 NOTE — Addendum Note (Signed)
Addended by: [name] on: 08/21/2018 12:40 PM   Modules accepted: Level of Service

## 2018-08-21 NOTE — Patient Instructions (Signed)
It was very nice to see you today!  Keep up the good work!  No changes today.  Come back to see me in 1 year or sooner as needed.   Take care, Dr [name]

## 2018-08-21 NOTE — Assessment & Plan Note (Signed)
Stable.  Renal function checked 2 weeks ago and was stable.

## 2018-08-21 NOTE — Assessment & Plan Note (Signed)
Stable.  Continue Lipitor 10 mg daily.

## 2018-08-21 NOTE — Assessment & Plan Note (Signed)
On Basaglar 14 units daily and lispro 4 to 6 units 3 times daily.  Managed by endocrinology.

## 2018-08-21 NOTE — Assessment & Plan Note (Signed)
At goal.  Continue losartan 12.5 mg daily.  He is on Florinef for orthostasis.  Hopefully will be able to wean off soon now that he is finished with chemotherapy.  This is managed by cardiology.

## 2018-08-21 NOTE — Assessment & Plan Note (Signed)
Stable.  Follows with cardiology.  Continue Plavix and statin.

## 2018-08-21 NOTE — Assessment & Plan Note (Signed)
Stable.  Continue Creon.  Managed by oncology.

## 2018-08-23 ENCOUNTER — Telehealth: Payer: Self-pay

## 2018-08-23 ENCOUNTER — Other Ambulatory Visit (HOSPITAL_COMMUNITY)
Admission: RE | Admit: 2018-08-23 | Discharge: 2018-08-23 | Disposition: A | Discharge status: Home / Self Care | Payer: Medicare Other | Source: Ambulatory Visit | Attending: Cardiology | Admitting: Cardiology

## 2018-08-23 DIAGNOSIS — I25119 Atherosclerotic heart disease of native coronary artery with unspecified angina pectoris: Secondary | ICD-10-CM | POA: Diagnosis not present

## 2018-08-23 LAB — BASIC METABOLIC PANEL
Anion gap: 6 (ref 5–15)
BUN: 26 mg/dL — ABNORMAL HIGH (ref 8–23)
CO2: 24 mmol/L (ref 22–32)
Calcium: 9 mg/dL (ref 8.9–10.3)
Chloride: 107 mmol/L (ref 98–111)
Creatinine, Ser: 1.49 mg/dL — ABNORMAL HIGH (ref 0.61–1.24)
GFR calc Af Amer: 53 mL/min — ABNORMAL LOW (ref >60)
GFR calc non Af Amer: 46 mL/min — ABNORMAL LOW (ref >60)
Glucose, Bld: 284 mg/dL — ABNORMAL HIGH (ref 70–99)
Potassium: 3.7 mmol/L (ref 3.5–5.1)
Sodium: 137 mmol/L (ref 135–145)

## 2018-08-23 NOTE — Telephone Encounter (Signed)
BP log reviewed by Dr.McDowell,"continue current regime, would stay on Florinef"     LM for pt to call back

## 2018-08-24 NOTE — Telephone Encounter (Signed)
I spoke with wife, pt will continue all meds and also continue to monitor BP

## 2018-08-29 ENCOUNTER — Telehealth: Payer: Self-pay | Admitting: Internal Medicine

## 2018-08-29 NOTE — Telephone Encounter (Signed)
So did they bring the log documenting 4x a day checks?

## 2018-08-29 NOTE — Telephone Encounter (Signed)
Please advise 

## 2018-08-29 NOTE — Telephone Encounter (Signed)
Patients wife [place] calling for updates on message below "Patients wife has called in regards to dropping off her husbands readings last month to be sent off for it to be covered by Medicare. Please Advise with wife, thanks"  Please Advise, thanks

## 2018-08-30 ENCOUNTER — Telehealth: Payer: Self-pay | Admitting: Internal Medicine

## 2018-08-30 NOTE — Telephone Encounter (Signed)
Logs have been given to Dr. Cruzita Lederer

## 2018-08-30 NOTE — Telephone Encounter (Signed)
error 

## 2018-08-30 NOTE — Telephone Encounter (Signed)
Colletta [place] spoke with the patient's wife regarding the BS logs she brought in, [place] took a new copy of the logs today and placing on Nurses desk directly

## 2018-08-30 NOTE — Telephone Encounter (Signed)
Logs have been reviewed by Dr. Cruzita Lederer and placed in the folder on Melissa's desk labeled Elenor [place] for her when she returns because I do not see the paperwork for this

## 2018-09-10 ENCOUNTER — Other Ambulatory Visit: Payer: Self-pay | Admitting: Cardiology

## 2018-09-18 ENCOUNTER — Inpatient Hospital Stay: Payer: Medicare Other | Attending: Oncology

## 2018-09-18 ENCOUNTER — Inpatient Hospital Stay (HOSPITAL_BASED_OUTPATIENT_CLINIC_OR_DEPARTMENT_OTHER): Payer: Medicare Other | Admitting: Nurse Practitioner

## 2018-09-18 ENCOUNTER — Encounter: Payer: Self-pay | Admitting: Nurse Practitioner

## 2018-09-18 ENCOUNTER — Telehealth: Payer: Self-pay

## 2018-09-18 ENCOUNTER — Inpatient Hospital Stay: Payer: Medicare Other

## 2018-09-18 VITALS — BP 153/83 | HR 74 | Temp 97.7°F | Resp 17 | Ht 69.0 in | Wt 131.3 lb

## 2018-09-18 DIAGNOSIS — D6481 Anemia due to antineoplastic chemotherapy: Secondary | ICD-10-CM

## 2018-09-18 DIAGNOSIS — D696 Thrombocytopenia, unspecified: Secondary | ICD-10-CM

## 2018-09-18 DIAGNOSIS — Z87891 Personal history of nicotine dependence: Secondary | ICD-10-CM | POA: Diagnosis not present

## 2018-09-18 DIAGNOSIS — I10 Essential (primary) hypertension: Secondary | ICD-10-CM | POA: Insufficient documentation

## 2018-09-18 DIAGNOSIS — Z905 Acquired absence of kidney: Secondary | ICD-10-CM | POA: Diagnosis not present

## 2018-09-18 DIAGNOSIS — T451X5A Adverse effect of antineoplastic and immunosuppressive drugs, initial encounter: Secondary | ICD-10-CM | POA: Diagnosis not present

## 2018-09-18 DIAGNOSIS — Z85528 Personal history of other malignant neoplasm of kidney: Secondary | ICD-10-CM | POA: Insufficient documentation

## 2018-09-18 DIAGNOSIS — C642 Malignant neoplasm of left kidney, except renal pelvis: Secondary | ICD-10-CM

## 2018-09-18 DIAGNOSIS — C250 Malignant neoplasm of head of pancreas: Secondary | ICD-10-CM

## 2018-09-18 DIAGNOSIS — I2510 Atherosclerotic heart disease of native coronary artery without angina pectoris: Secondary | ICD-10-CM | POA: Insufficient documentation

## 2018-09-18 DIAGNOSIS — E119 Type 2 diabetes mellitus without complications: Secondary | ICD-10-CM

## 2018-09-18 DIAGNOSIS — Z95828 Presence of other vascular implants and grafts: Secondary | ICD-10-CM

## 2018-09-18 DIAGNOSIS — E669 Obesity, unspecified: Secondary | ICD-10-CM | POA: Insufficient documentation

## 2018-09-18 DIAGNOSIS — Z8507 Personal history of malignant neoplasm of pancreas: Secondary | ICD-10-CM | POA: Insufficient documentation

## 2018-09-18 DIAGNOSIS — I252 Old myocardial infarction: Secondary | ICD-10-CM | POA: Insufficient documentation

## 2018-09-18 DIAGNOSIS — Z9221 Personal history of antineoplastic chemotherapy: Secondary | ICD-10-CM | POA: Insufficient documentation

## 2018-09-18 LAB — CMP (CANCER CENTER ONLY)
ALT: 35 U/L (ref 0–44)
AST: 29 U/L (ref 15–41)
Albumin: 3.6 g/dL (ref 3.5–5.0)
Alkaline Phosphatase: 104 U/L (ref 38–126)
Anion gap: 8 (ref 5–15)
BUN: 27 mg/dL — ABNORMAL HIGH (ref 8–23)
CO2: 26 mmol/L (ref 22–32)
Calcium: 9.1 mg/dL (ref 8.9–10.3)
Chloride: 107 mmol/L (ref 98–111)
Creatinine: 1.48 mg/dL — ABNORMAL HIGH (ref 0.61–1.24)
GFR, Est AFR Am: 53 mL/min — ABNORMAL LOW (ref >60)
GFR, Estimated: 46 mL/min — ABNORMAL LOW (ref >60)
Glucose, Bld: 115 mg/dL — ABNORMAL HIGH (ref 70–99)
Potassium: 3.6 mmol/L (ref 3.5–5.1)
Sodium: 141 mmol/L (ref 135–145)
Total Bilirubin: 0.5 mg/dL (ref 0.3–1.2)
Total Protein: 6.4 g/dL — ABNORMAL LOW (ref 6.5–8.1)

## 2018-09-18 LAB — CBC WITH DIFFERENTIAL (CANCER CENTER ONLY)
Abs Immature Granulocytes: 0.01 10*3/uL (ref 0.00–0.07)
Basophils Absolute: 0 10*3/uL (ref 0.0–0.1)
Basophils Relative: 0 %
Eosinophils Absolute: 0.1 10*3/uL (ref 0.0–0.5)
Eosinophils Relative: 2 %
HCT: 32.4 % — ABNORMAL LOW (ref 39.0–52.0)
Hemoglobin: 10.1 g/dL — ABNORMAL LOW (ref 13.0–17.0)
Immature Granulocytes: 0 %
Lymphocytes Relative: 25 %
Lymphs Abs: 1.4 10*3/uL (ref 0.7–4.0)
MCH: 30.1 pg (ref 26.0–34.0)
MCHC: 31.2 g/dL (ref 30.0–36.0)
MCV: 96.4 fL (ref 80.0–100.0)
Monocytes Absolute: 0.7 10*3/uL (ref 0.1–1.0)
Monocytes Relative: 12 %
Neutro Abs: 3.4 10*3/uL (ref 1.7–7.7)
Neutrophils Relative %: 61 %
Platelet Count: 114 10*3/uL — ABNORMAL LOW (ref 150–400)
RBC: 3.36 MIL/uL — ABNORMAL LOW (ref 4.22–5.81)
RDW: 15 % (ref 11.5–15.5)
WBC Count: 5.6 10*3/uL (ref 4.0–10.5)
nRBC: 0 % (ref 0.0–0.2)

## 2018-09-18 MED ORDER — SODIUM CHLORIDE 0.9% FLUSH
10.0000 mL | Freq: Once | INTRAVENOUS | Status: AC
  Administered 2018-09-18: 10 mL
  Filled 2018-09-18: qty 10

## 2018-09-18 MED ORDER — HEPARIN SOD (PORK) LOCK FLUSH 100 UNIT/ML IV SOLN
500.0000 [IU] | Freq: Once | INTRAVENOUS | Status: AC
  Administered 2018-09-18: 500 [IU]
  Filled 2018-09-18: qty 5

## 2018-09-18 NOTE — Telephone Encounter (Signed)
Printed avs and calender of upcoming appointment. Per 1/7 los

## 2018-09-18 NOTE — Progress Notes (Addendum)
[place] OFFICE PROGRESS NOTE   Diagnosis: Pancreas cancer  INTERVAL HISTORY:   Manuel Olson returns as scheduled.  He overall feels well.  He has a good appetite.  He thinks he is gaining weight.  He denies abdominal pain.  He has infrequent loose stools.  No nausea or vomiting.  He has noted an increase in easy bruising.  Objective:  Vital signs in last 24 hours:  Blood pressure (!) 153/83, pulse 74, temperature 97.7 F (36.5 C), temperature source Oral, resp. rate 17, height 5' 9"  (1.753 m), weight 131 lb 4.8 oz (59.6 kg), SpO2 100 %.    HEENT: Small ecchymosis just inferior to the left eye.  Ecchymoses scattered over the forearms. Lymphatics: No palpable cervical, supraclavicular or axillary lymph nodes. Resp: Lungs clear bilaterally. Cardio: Regular rate and rhythm. GI: Abdomen soft and nontender.  No hepatomegaly.  No mass. Vascular: No leg edema.  Skin: Ecchymoses scattered over the forearms. Port-A-Cath without erythema.  Lab Results:  Lab Results  Component Value Date   WBC 5.6 09/18/2018   HGB 10.1 (L) 09/18/2018   HCT 32.4 (L) 09/18/2018   MCV 96.4 09/18/2018   PLT 114 (L) 09/18/2018   NEUTROABS 3.4 09/18/2018    Imaging:  No results found.  Medications: I have reviewed the patient's current medications.  Assessment/Plan: 1. Adenocarcinoma of the pancreas head/neck, status post an EUS biopsy 08/02/2017 confirming adenocarcinoma ? EUS 08/02/2017-pancreas head/neck mass, abutment of the portal vein, no peripancreatic adenopathy ? MRI abdomen 07/27/2017-head/neck pancreas mass with mass-effect on the portal splenic confluence, no involvement of the celiac axis or superior mesenteric artery, no evidence of metastatic disease ? PET scan 08/31/2017-very hypermetabolism at the pancreatic head mass, no evidence of metastatic disease. The isolated right lower lobe nodule below PET resolution, left sided renal mass with decreased activity  relative to the normal adjacent kidney ? Cycle 1 FOLFIRINOX 09/16/2017 ? Cycle 2 FOLFOX 10/16/2017 ? Cycle 3 FOLFOX 10/30/2017 ? Cycle 4 FOLFIRINOX 11/14/2017 (irinotecan resumed with a dose reduction) ? Cycle 5 FOLFIRINOX3/20/2019 (Oxaliplatin dose reduced due to thrombocytopenia) ? Cycle 6 FOLFIRINOX4/11/2017 ? CTs 12/25/2017-stable right lower lobe nodule, decrease in size of the pancreas head mass, stable left kidney mass ? Pancreaticoduodenectomy and left nephrectomy 02/19/2018,pT1cpN1, 2/18 lymph nodes positive, lymphovascular and perineural invasion present, mild to moderate treatment effect, tumor involved adherent portal vein with negative portal vein margins ? Cycle 1 adjuvant gemcitabine/Abraxane 04/25/2018 ? Cycle 2 adjuvant gemcitabine/Abraxane 05/16/2018 ? Cycle 3 adjuvant gemcitabine/Abraxane 05/31/2018 ? Cycle 4 adjuvant gemcitabine/Abraxane 06/13/2018 ? Cycle 5 adjuvant gemcitabine/Abraxane 06/27/2018 ? Cycle 6 adjuvant gemcitabine/Abraxane 07/11/2018  2. 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 Barry Dienes  9. Diarrhea-potentially related to pancreas insufficiency, he began a trial of pancreatic enzyme replacement 05/16/2018: Diarrhea resolved  10. Dehydrationon 03/19/2018-statustreatments with IV fluidsforpast 2 weeks  Admission 04/03/2018 with dehydration and hypotension  Persistent orthostatic hypotension-compression stockings and Florinef added 04/10/2018 improved  11.  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  Olson remains in clinical remission from pancreas cancer.  We will follow-up on the CA-19-9 from today.  He has mild thrombocytopenia on labs today.  Etiology  unclear.  Question residual effect from chemotherapy.  He will return for a follow-up CBC in 2 weeks.  He will contact the office with any bleeding.  He will return for labs, Port-A-Cath flush and a follow-up visit in 6 weeks.  He will contact the office in the interim as outlined above or with any other problems.  Patient seen with Dr. Benay Spice.    [name] ANP/GNP-BC   09/18/2018  10:49 AM  Manuel Olson appears well.  He has developed mild thrombocytopenia.  He will return for a repeat platelet count in 2 weeks.  Julieanne Manson, MD

## 2018-09-19 ENCOUNTER — Telehealth: Payer: Self-pay | Admitting: Internal Medicine

## 2018-09-19 LAB — CANCER ANTIGEN 19-9: CA 19-9: 8 U/mL (ref 0–35)

## 2018-09-19 NOTE — Telephone Encounter (Signed)
I have not seen nor processed paperwork regarding this.

## 2018-09-19 NOTE — Telephone Encounter (Signed)
Documentation has been faxed, awaiting approval.

## 2018-09-19 NOTE — Telephone Encounter (Signed)
Please update patients wife on [place] paperwork. Please Advise, thanks

## 2018-09-24 NOTE — Telephone Encounter (Signed)
PA approved and they have shipped this to patient.

## 2018-09-27 ENCOUNTER — Encounter: Payer: Self-pay | Admitting: Cardiology

## 2018-09-27 ENCOUNTER — Ambulatory Visit (INDEPENDENT_AMBULATORY_CARE_PROVIDER_SITE_OTHER): Payer: Medicare Other | Admitting: Cardiology

## 2018-09-27 VITALS — BP 160/88 | HR 75 | Ht 68.0 in | Wt 134.0 lb

## 2018-09-27 DIAGNOSIS — I10 Essential (primary) hypertension: Secondary | ICD-10-CM | POA: Diagnosis not present

## 2018-09-27 DIAGNOSIS — I255 Ischemic cardiomyopathy: Secondary | ICD-10-CM | POA: Diagnosis not present

## 2018-09-27 DIAGNOSIS — I25119 Atherosclerotic heart disease of native coronary artery with unspecified angina pectoris: Secondary | ICD-10-CM | POA: Diagnosis not present

## 2018-09-27 DIAGNOSIS — Z952 Presence of prosthetic heart valve: Secondary | ICD-10-CM | POA: Diagnosis not present

## 2018-09-27 MED ORDER — LOSARTAN POTASSIUM 25 MG PO TABS: 25.0000 mg | ORAL_TABLET | Freq: Every day | ORAL | 3 refills | Status: DC

## 2018-09-27 NOTE — Progress Notes (Signed)
Cardiology Office Note  Date: 09/27/2018   ID: [name], DOB 17-Jun-1946, MRN [id number]  PCP: [name], MD  Primary Cardiologist: Rozann Lesches, MD   Chief Complaint  Patient presents with  . Coronary Artery Disease  . History of TAVR    History of Present Illness: Manuel Olson is a 73 y.o. male last seen in November 2019.  He is here with his wife for a follow-up visit.  Overall has been stable, continues with oncology follow-up and observation at this point having completed his chemotherapy in October 2019.  He does not report any angina at this time. States that his appetite is very good.  No orthopnea or PND, no palpitations or syncope.  Echocardiogram done in November 2019 revealed LVEF approximately 50% with stable TAVR prosthesis and very mild perivalvular leak.  I personally reviewed his ECG today which shows sinus rhythm with poor R wave progression consistent with previous anterior infarct and nonspecific ST-T changes.  We did go over his medications today.  Home blood pressure checks do show orthostatic change although he is asymptomatic and his standing systolics are no lower than 115.  Seated and supine systolics are 010U to 725D.  Past Medical History:  Diagnosis Date  . Arthritis   . CAD (coronary artery disease)    a. 11/2016 in [place]: STEMI s/p DES to mid LAD, DES to diagonal, DES x 2 to proximal and mid RCA   . Essential hypertension   . Former smoker   . GERD (gastroesophageal reflux disease)   . History of stroke   . Hyperlipidemia   . Myocardial infarction ([name])   . Pancreatic cancer ([place])   . Pneumonia   . Renal mass   . S/P TAVR (transcatheter aortic valve replacement)    a. 08/2017:  Edwards Sapien 3 THV (size 26 mm, model #9600CM26A , serial # [id number])  . Severe aortic stenosis    a. 08/2017: s/p TAVR by Dr. Angelena Form and Dr. Cyndia Bent.   . Type 2 diabetes mellitus ([place])     Past Surgical History:  Procedure Laterality Date    . APPENDECTOMY    . CARDIAC CATHETERIZATION    . EUS N/A 08/02/2017   Procedure: UPPER ENDOSCOPIC ULTRASOUND (EUS) RADIAL;  Surgeon: [name], MD;  [facility]: [facility];  Service: Endoscopy;  Laterality: N/A;  . EYE SURGERY Left   . HAND SURGERY Bilateral   . HERNIA REPAIR Right   . LAPAROSCOPY N/A 02/19/2018   Procedure: DIAGNOSTIC LAPAROSCOPY, ERAS PATHWAY;  Surgeon: [name], MD;  Location: [facility];  Service: General;  Laterality: N/A;  . NEPHRECTOMY Left 02/19/2018   Procedure: OPEN LEFT RADICAL NEPHRECTOMY;  Surgeon: Cleon Gustin, MD;  Location: [facility];  Service: Urology;  Laterality: Left;  . PORTACATH PLACEMENT N/A 09/15/2017   Procedure: INSERTION PORT-A-CATH;  Surgeon: [name], MD;  Location: [facility];  Service: General;  Laterality: N/A;  . RIGHT/LEFT HEART CATH AND CORONARY ANGIOGRAPHY N/A 07/05/2017   Procedure: RIGHT/LEFT HEART CATH AND CORONARY ANGIOGRAPHY;  Surgeon: [name], MD;  Location: [facility];  Service: Cardiovascular;  Laterality: N/A;  . SHOULDER ARTHROSCOPY WITH ROTATOR CUFF REPAIR Left   . TEE WITHOUT CARDIOVERSION N/A 08/22/2017   Procedure: TRANSESOPHAGEAL ECHOCARDIOGRAM (TEE);  Surgeon: Burnell Blanks, MD;  Location: [facility];  Service: Open Heart Surgery;  Laterality: N/A;  . TRANSCATHETER AORTIC VALVE REPLACEMENT, TRANSFEMORAL N/A 08/22/2017   Procedure: TRANSCATHETER AORTIC VALVE REPLACEMENT, TRANSFEMORAL;  Surgeon: Burnell Blanks, MD;  Location: [facility];  Service: Open Heart Surgery;  Laterality: N/A;  . WHIPPLE PROCEDURE N/A 02/19/2018   Procedure: WHIPPLE PROCEDURE;  Surgeon: [name], MD;  Location: [facility];  Service: General;  Laterality: N/A;    Current Outpatient Medications  Medication Sig Dispense Refill  . atorvastatin (LIPITOR) 20 MG tablet Take 20 mg by mouth daily.    . clopidogrel (PLAVIX) 75 MG tablet TAKE 1 TABLET BY MOUTH ONCE DAILY. 30 tablet 6  . CREON 36000 units  CPEP capsule TAKE 1 CAPSULE BY MOUTH BEFORE EACH MEAL AND SNACK. TAKE UP TO 6 CAPSULES PER DAY. 180 capsule 4  . fludrocortisone (FLORINEF) 0.1 MG tablet TAKE 1 TABLET BY MOUTH ONCE DAILY. 30 tablet 10  . Insulin Glargine (BASAGLAR KWIKPEN) 100 UNIT/ML SOPN Inject 0.14 mLs (14 Units total) into the skin at bedtime. 5 pen 5  . insulin lispro (HUMALOG KWIKPEN) 100 UNIT/ML KiwkPen Inject 0.04-0.06 mLs (4-6 Units total) into the skin 3 (three) times daily. 15 mL 5  . Insulin Pen Needle (PEN NEEDLES) 31G X 6 MM MISC Use as directed. 100 each 0  . lidocaine-prilocaine (EMLA) cream lidocaine-prilocaine 2.5 %-2.5 % topical cream    . losartan (COZAAR) 25 MG tablet Take 1 tablet (25 mg total) by mouth daily. 90 tablet 3   No current facility-administered medications for this visit.    Allergies:  Patient has no known allergies.   Social History: The patient  reports that he quit smoking about 2 years ago. His smoking use included cigarettes. He started smoking about 60 years ago. He has a 57.00 pack-year smoking history. He has never used smokeless tobacco. He reports that he does not drink alcohol or use drugs.   ROS:  Please see the history of present illness. Otherwise, complete review of systems is positive for hearing loss, intermittent fatigue.  All other systems are reviewed and negative.   Physical Exam: VS:  BP (!) 160/88 (BP Location: Right Arm)   Pulse 75   Ht 5' 8"  (1.727 m)   Wt 134 lb (60.8 kg)   SpO2 97%   BMI 20.37 kg/m , BMI Body mass index is 20.37 kg/m.  Wt Readings from Last 3 Encounters:  09/27/18 134 lb (60.8 kg)  09/18/18 131 lb 4.8 oz (59.6 kg)  08/21/18 129 lb 6.4 oz (58.7 kg)    General: Chronically ill-appearing male, no distress. HEENT: Conjunctiva and lids normal, oropharynx clear. Neck: Supple, no elevated JVP or carotid bruits, no thyromegaly. Lungs: Clear to auscultation, nonlabored breathing at rest. Cardiac: Regular rate and rhythm, no S3, soft systolic  murmur. Abdomen: Soft, nontender, bowel sounds present. Extremities: No pitting edema, distal pulses 2+. Skin: Warm and dry. Musculoskeletal: Muscle wasting. Neuropsychiatric: Alert and oriented x3, affect grossly appropriate.  ECG: I personally reviewed the tracing from 08/31/2017 which showed sinus rhythm with prolonged PR interval, poor R wave progression consistent with old anterior infarct pattern.  Recent Labwork: 04/04/2018: TSH 1.267 08/07/2018: Magnesium 2.0 09/18/2018: ALT 35; AST 29; BUN 27; Creatinine 1.48; Hemoglobin 10.1; Platelet Count 114; Potassium 3.6; Sodium 141     Component Value Date/Time   CHOL 102 03/16/2017 0840   TRIG 71 03/16/2017 0840   HDL 36 (L) 03/16/2017 0840   CHOLHDL 2.8 03/16/2017 0840   VLDL 14 03/16/2017 0840   LDLCALC 52 03/16/2017 0840    Other Studies Reviewed Today:  Echocardiogram 08/02/2018: Study Conclusions  - Left ventricle: The cavity size was normal.  Wall thickness was   increased in a pattern of mild LVH. Systolic function was normal.   The estimated ejection fraction was = 50%. Wall motion was   normal; there were no regional wall motion abnormalities. Doppler   parameters are consistent with abnormal left ventricular   relaxation (grade 1 diastolic dysfunction). Doppler parameters   are consistent with high ventricular filling pressure. - Aortic valve: History of TAVR. There is an Edwards Sapien 3 THV   26 mm tissue valve in the AV position. There was no stenosis.   Very mild perivalvualar leak. - Mitral valve: Mildly calcified annulus. Mildly thickened leaflets. - Left atrium: The atrium was mildly dilated. - Atrial septum: No defect or patent foramen ovale was identified.  Assessment and Plan:  1.  Multivessel CAD status post anterior STEMI in 2018 with DES to the LAD, DES to the diagonal, and DES x2 to the RCA.  He continues on Plavix and statin therapy.  No active angina symptoms.  2.  Severe aortic stenosis status  post TAVR in December 2018.  Follow-up echocardiogram in November 2019 showed stable function with very mild perivalvular regurgitation.  3.  Essential hypertension with history of orthostasis.  He is on low-dose Florinef and asymptomatic at this time.  Attempt an increase in Cozaar to 25 mg daily for better control of blood pressure overall.  4.  Cardiomyopathy, LVEF approximately 50% by follow-up echocardiogram in November 2019.  Current medicines were reviewed with the patient today.   Orders Placed This Encounter  Procedures  . EKG 12-Lead    Disposition: Follow-up in 6 months.  Signed, Satira Sark, MD, [facility] 09/27/2018 1:48 PM    [place] at [facility] [address] [place], [place] [address] Phone: [phone]; Fax: [phone]

## 2018-09-27 NOTE — Progress Notes (Signed)
ekg 

## 2018-09-27 NOTE — Patient Instructions (Signed)
Medication Instructions:  Increase COZZAR to 25 mg daily   Labwork: none  Testing/Procedures: none  Follow-Up:  Your physician wants you to follow-up in: 6 months.  You will receive a reminder letter in the mail two months in advance. If you don't receive a letter, please call our office to schedule the follow-up appointment.  Any Other Special Instructions Will Be Listed Below (If Applicable).     If you need a refill on your cardiac medications before your next appointment, please call your pharmacy.

## 2018-10-01 ENCOUNTER — Encounter: Payer: Self-pay | Admitting: Thoracic Surgery (Cardiothoracic Vascular Surgery)

## 2018-10-02 ENCOUNTER — Inpatient Hospital Stay: Payer: Medicare Other

## 2018-10-02 DIAGNOSIS — Z85528 Personal history of other malignant neoplasm of kidney: Secondary | ICD-10-CM | POA: Diagnosis not present

## 2018-10-02 DIAGNOSIS — Z8507 Personal history of malignant neoplasm of pancreas: Secondary | ICD-10-CM | POA: Diagnosis not present

## 2018-10-02 DIAGNOSIS — Z905 Acquired absence of kidney: Secondary | ICD-10-CM | POA: Diagnosis not present

## 2018-10-02 DIAGNOSIS — Z9221 Personal history of antineoplastic chemotherapy: Secondary | ICD-10-CM | POA: Diagnosis not present

## 2018-10-02 DIAGNOSIS — C250 Malignant neoplasm of head of pancreas: Secondary | ICD-10-CM

## 2018-10-02 DIAGNOSIS — E669 Obesity, unspecified: Secondary | ICD-10-CM | POA: Diagnosis not present

## 2018-10-02 DIAGNOSIS — D696 Thrombocytopenia, unspecified: Secondary | ICD-10-CM | POA: Diagnosis not present

## 2018-10-02 LAB — CBC WITH DIFFERENTIAL (CANCER CENTER ONLY)
Abs Immature Granulocytes: 0.01 10*3/uL (ref 0.00–0.07)
Basophils Absolute: 0 10*3/uL (ref 0.0–0.1)
Basophils Relative: 0 %
Eosinophils Absolute: 0.1 10*3/uL (ref 0.0–0.5)
Eosinophils Relative: 2 %
HCT: 32.3 % — ABNORMAL LOW (ref 39.0–52.0)
Hemoglobin: 10 g/dL — ABNORMAL LOW (ref 13.0–17.0)
Immature Granulocytes: 0 %
Lymphocytes Relative: 20 %
Lymphs Abs: 1.2 10*3/uL (ref 0.7–4.0)
MCH: 30 pg (ref 26.0–34.0)
MCHC: 31 g/dL (ref 30.0–36.0)
MCV: 97 fL (ref 80.0–100.0)
Monocytes Absolute: 0.6 10*3/uL (ref 0.1–1.0)
Monocytes Relative: 10 %
Neutro Abs: 4.1 10*3/uL (ref 1.7–7.7)
Neutrophils Relative %: 68 %
Platelet Count: 107 10*3/uL — ABNORMAL LOW (ref 150–400)
RBC: 3.33 MIL/uL — ABNORMAL LOW (ref 4.22–5.81)
RDW: 14.6 % (ref 11.5–15.5)
WBC Count: 6.1 10*3/uL (ref 4.0–10.5)
nRBC: 0 % (ref 0.0–0.2)

## 2018-10-03 ENCOUNTER — Telehealth: Payer: Self-pay

## 2018-10-03 NOTE — Telephone Encounter (Signed)
TC to pt per Lattie Haw to let him know the platelet count is still decreased, tocall with any bleeding and to continue to follow-up as scheduled. Pt verbalized understanding. No further problems or concerns at this time.

## 2018-10-03 NOTE — Telephone Encounter (Signed)
Left voicemail for pt to call back [place].

## 2018-10-12 ENCOUNTER — Ambulatory Visit (INDEPENDENT_AMBULATORY_CARE_PROVIDER_SITE_OTHER): Payer: Medicare Other | Admitting: Internal Medicine

## 2018-10-12 ENCOUNTER — Encounter: Payer: Self-pay | Admitting: Internal Medicine

## 2018-10-12 VITALS — BP 170/80 | HR 78 | Ht 68.0 in | Wt 136.0 lb

## 2018-10-12 DIAGNOSIS — I255 Ischemic cardiomyopathy: Secondary | ICD-10-CM

## 2018-10-12 DIAGNOSIS — E782 Mixed hyperlipidemia: Secondary | ICD-10-CM

## 2018-10-12 DIAGNOSIS — E1159 Type 2 diabetes mellitus with other circulatory complications: Secondary | ICD-10-CM | POA: Diagnosis not present

## 2018-10-12 DIAGNOSIS — E1165 Type 2 diabetes mellitus with hyperglycemia: Secondary | ICD-10-CM | POA: Diagnosis not present

## 2018-10-12 LAB — LIPID PANEL
Cholesterol: 88 mg/dL (ref 0–200)
HDL: 43.2 mg/dL (ref >39.00)
LDL Cholesterol: 34 mg/dL (ref 0–99)
NonHDL: 44.74
Total CHOL/HDL Ratio: 2
Triglycerides: 55 mg/dL (ref 0.0–149.0)
VLDL: 11 mg/dL (ref 0.0–40.0)

## 2018-10-12 LAB — POCT GLYCOSYLATED HEMOGLOBIN (HGB A1C): Hemoglobin A1C: 7.6 % — AB (ref 4.0–5.6)

## 2018-10-12 NOTE — Patient Instructions (Addendum)
Please continue - Basaglar  8-10 units at bedtime - Humalog 2-4 units before the 3 meals, even if the sugars before the meals are normal. If sugar before meals <70, do not take Humalog with that meals.  Your glucose targets are: - 80-130 before meals - <180 after meals  When the CGM shows a value <70, please also check with your meter.  Please return in 3-4 months with your sugar log.

## 2018-10-12 NOTE — Progress Notes (Signed)
Patient ID: Manuel Olson, male   DOB: [birth year], 73 y.o.   MRN: [id number]   HPI: Manuel Olson is a 73 y.o.-year-old male, initially referred by his oncologist, Dr. Benay Spice, returning for follow-up for DM2, dx in 01/2016, insulin-dependent, uncontrolled, with complications (CAD - s/p AMI 11/2016 - s/p 4x DES; Ao stenosis). He saw Dr. Dorris Fetch before.  Last visit 3.5 months ago.  He is here with his wife who offers a significant amount of history especially regarding his cancer, diabetes medicines and sugars at home.    Patient has a significant history of pancreatic cancer diagnosed in 07/2017.  He started chemotherapy 09/2017.  Since then, he also had a kidney removed for renal cell carcinoma at the same time he had pancreatic resection (head of the pancreas) in 02/2018.  He was found to have metastases in his lymph nodes.  He restarted chemotherapy after the surgery. He finished ChTx 07/12/2018.  Last hemoglobin A1c was: Lab Results  Component Value Date   HGBA1C 7.1 (A) 06/25/2018   HGBA1C 6.4 (H) 02/09/2018   HGBA1C 8.2 (H) 11/28/2017   Pt was on a regimen of: - Glipizide 5 mg daily  - Tresiba 20 units at bedtime (samples, cannot afford this)    He is currently on:  - Basaglar  8-10 units at bedtime - Humalog 2 to 4 units before the 3 meals-reintroduced 06/2018 -however, upon questioning, he is not taking this if the sugars are at goal before a meal.  Therefore, he only takes this with dinner.  Pt was checking his sugars 4 times a day: - am:  52, 55, 59, 62-120, 135 >> 89-135, 166 - 2h after b'fast: n/c - before lunch: 140-301 >> 95-151, 164 >> n/c  - 2h after lunch: n/c - before dinner: 107-162, 222, 243, 291 >> 140 >> 152-260 - 2h after dinner: n/c - bedtime:  98-176 >> 143, 159-255, 332 >> 104-262 - nighttime: n/c Lowest sugar was 41 >> 52 >> 42 (libre); it is unclear at which level he has hypoglycemia awareness Highest sugar was 332 >> 262  Glucometer: True  Metrix  Pt's meals are: - Breakfast: bacon + eggs + English muffin + occas. Tomato juice   - Lunch: grilled ham and cheese sandwich + potato chips - Dinner: chicken + veggies + starch - Snacks: fruit cup, pudding  - 1x a day  + CKD, last BUN/creatinine:  Lab Results  Component Value Date   BUN 27 (H) 09/18/2018   BUN 26 (H) 08/23/2018   CREATININE 1.48 (H) 09/18/2018   CREATININE 1.49 (H) 08/23/2018  On Cozaar + HL; last set of lipids: Lab Results  Component Value Date   CHOL 102 03/16/2017   HDL 36 (L) 03/16/2017   LDLCALC 52 03/16/2017   TRIG 71 03/16/2017   CHOLHDL 2.8 03/16/2017  On Lipitor 20 - last eye exam was in 07/2018: No DR."I had a stroke in my eye". + h/o LASER sx'es. -He denies numbness but has tingling in his feet  Pt has FH of DM in brother.  He also has a history of HTN.  ROS: Constitutional: + weight gain/no weight loss, no fatigue, no subjective hyperthermia, no subjective hypothermia Eyes: no blurry vision, no xerophthalmia ENT: no sore throat, no nodules palpated in neck, no dysphagia, no odynophagia, no hoarseness Cardiovascular: no CP/no SOB/no palpitations/no leg swelling Respiratory: no cough/no SOB/no wheezing Gastrointestinal: no N/no V/no D/no C/no acid reflux Musculoskeletal: no muscle aches/no joint aches Skin: no rashes,  no hair loss Neurological: no tremors/no numbness/no tingling/no dizziness  I reviewed pt's medications, allergies, PMH, social hx, family hx, and changes were documented in the history of present illness. Otherwise, unchanged from my initial visit note.  Past Medical History:  Diagnosis Date  . Arthritis   . CAD (coronary artery disease)    a. 11/2016 in [place]: STEMI s/p DES to mid LAD, DES to diagonal, DES x 2 to proximal and mid RCA   . Essential hypertension   . Former smoker   . GERD (gastroesophageal reflux disease)   . History of stroke   . Hyperlipidemia   . Myocardial infarction ([place])   . Pancreatic cancer  ([place])   . Pneumonia   . Renal mass   . S/P TAVR (transcatheter aortic valve replacement)    a. 08/2017:  Edwards Sapien 3 THV (size 26 mm, model #9600CM26A , serial # [id number])  . Severe aortic stenosis    a. 08/2017: s/p TAVR by Dr. Angelena Form and Dr. Cyndia Bent.   . Type 2 diabetes mellitus ([place])    Past Surgical History:  Procedure Laterality Date  . APPENDECTOMY    . CARDIAC CATHETERIZATION    . EUS N/A 08/02/2017   Procedure: UPPER ENDOSCOPIC ULTRASOUND (EUS) RADIAL;  Surgeon: [name], MD;  [facility];  Service: Endoscopy;  Laterality: N/A;  . EYE SURGERY Left   . HAND SURGERY Bilateral   . HERNIA REPAIR Right   . LAPAROSCOPY N/A 02/19/2018   Procedure: DIAGNOSTIC LAPAROSCOPY, ERAS PATHWAY;  Surgeon: [name], MD;  Location: [facility];  Service: General;  Laterality: N/A;  . NEPHRECTOMY Left 02/19/2018   Procedure: OPEN LEFT RADICAL NEPHRECTOMY;  Surgeon: Cleon Gustin, MD;  Location: [facility];  Service: Urology;  Laterality: Left;  . PORTACATH PLACEMENT N/A 09/15/2017   Procedure: INSERTION PORT-A-CATH;  Surgeon: [name], MD;  Location: [facility];  Service: General;  Laterality: N/A;  . RIGHT/LEFT HEART CATH AND CORONARY ANGIOGRAPHY N/A 07/05/2017   Procedure: RIGHT/LEFT HEART CATH AND CORONARY ANGIOGRAPHY;  Surgeon: [name], MD;  Location: [facility];  Service: Cardiovascular;  Laterality: N/A;  . SHOULDER ARTHROSCOPY WITH ROTATOR CUFF REPAIR Left   . TEE WITHOUT CARDIOVERSION N/A 08/22/2017   Procedure: TRANSESOPHAGEAL ECHOCARDIOGRAM (TEE);  Surgeon: Burnell Blanks, MD;  Location: [facility];  Service: Open Heart Surgery;  Laterality: N/A;  . TRANSCATHETER AORTIC VALVE REPLACEMENT, TRANSFEMORAL N/A 08/22/2017   Procedure: TRANSCATHETER AORTIC VALVE REPLACEMENT, TRANSFEMORAL;  Surgeon: Burnell Blanks, MD;  Location: [facility];  Service: Open Heart Surgery;  Laterality: N/A;  . WHIPPLE PROCEDURE N/A 02/19/2018    Procedure: WHIPPLE PROCEDURE;  Surgeon: [name], MD;  Location: [facility];  Service: General;  Laterality: N/A;   Social History   Socioeconomic History  . Marital status: Married    Spouse name: Not on file  . Number of children: Not on file  Social Needs  Occupational History  .  Retired  Tobacco Use  . Smoking status: Former Smoker    Packs/day: 1.00    Years: 57.00    Pack years: 57.00    Types: Cigarettes    Start date: 09/12/1958    Last attempt to quit: 2018    Years since quitting: 1.5  . Smokeless tobacco: Never Used  Substance and Sexual Activity  . Alcohol use: No  . Drug use: No   Current Outpatient Medications on File Prior to Visit  Medication Sig Dispense Refill  . atorvastatin (  LIPITOR) 20 MG tablet Take 20 mg by mouth daily.    . clopidogrel (PLAVIX) 75 MG tablet TAKE 1 TABLET BY MOUTH ONCE DAILY. 30 tablet 6  . CREON 36000 units CPEP capsule TAKE 1 CAPSULE BY MOUTH BEFORE EACH MEAL AND SNACK. TAKE UP TO 6 CAPSULES PER DAY. 180 capsule 4  . fludrocortisone (FLORINEF) 0.1 MG tablet TAKE 1 TABLET BY MOUTH ONCE DAILY. 30 tablet 10  . Insulin Glargine (BASAGLAR KWIKPEN) 100 UNIT/ML SOPN Inject 0.14 mLs (14 Units total) into the skin at bedtime. 5 pen 5  . insulin lispro (HUMALOG KWIKPEN) 100 UNIT/ML KiwkPen Inject 0.04-0.06 mLs (4-6 Units total) into the skin 3 (three) times daily. 15 mL 5  . Insulin Pen Needle (PEN NEEDLES) 31G X 6 MM MISC Use as directed. 100 each 0  . lidocaine-prilocaine (EMLA) cream lidocaine-prilocaine 2.5 %-2.5 % topical cream    . losartan (COZAAR) 25 MG tablet Take 1 tablet (25 mg total) by mouth daily. 90 tablet 3   No current facility-administered medications on file prior to visit.    No Known Allergies Family History  Problem Relation Age of Onset  . Lupus Mother   . CAD Brother   . Hypertension Brother     PE: BP (!) 170/80   Pulse 78   Ht 5' 8"  (1.727 m)   Wt 136 lb (61.7 kg)   SpO2 98%   BMI 20.68 kg/m  Wt Readings  from Last 3 Encounters:  10/12/18 136 lb (61.7 kg)  09/27/18 134 lb (60.8 kg)  09/18/18 131 lb 4.8 oz (59.6 kg)   Constitutional: Thin, in NAD, + temporal wasting Eyes: PERRLA, EOMI, no exophthalmos ENT: moist mucous membranes, no thyromegaly, no cervical lymphadenopathy Cardiovascular: RRR, No MRG Respiratory: CTA B Gastrointestinal: abdomen soft, NT, ND, BS+ Musculoskeletal: no deformities, strength intact in all 4 Skin: moist, warm, no rashes Neurological: no tremor with outstretched hands, DTR normal in all 4  ASSESSMENT: 1. DM2, non-insulin-dependent, uncontrolled, with complications - CAD - s/p AMI 11/2016 - s/p DES x4 -  Ao stenosis  2. HL  3.  Protein caloric malnutrition  PLAN:  1. Patient with history of initially controlled diabetes, with deteriorating control around the time of his pancreatic cancer diagnosis at the end of 2018.  He was initially on glipizide, with good control, but after his diagnosis we started insulin. -Of note, he also has protein caloric malnutrition and is on Megace to stimulate his appetite.  He is trying to gain more weight.  Therefore, we cannot use medications with weight loss effect for him. -At last visit, as he was having occasional low blood sugars in the morning, in the 50s, we reduce Basaglar.  However, he was having high blood sugars after meals so we reintroduced Humalog, especially with him trying to eat more. -Since last visit he was able to obtain a freestyle libre CGM, which he uses consistently and he likes. -At this visit, per review of his log: He is not usually taking Humalog with breakfast because sugars are at goal in the morning.  He does not check sugars before lunch so does not take Humalog with this meal, either.  When he checks before dinner, sugars are usually higher than target, even in the 200s.  He then takes Humalog with dinner and Basaglar at bedtime.  Therefore, sugars are at goal in the morning but quite high in the  second half of the day.  We discussed that he needs to take Humalog  with a meal even if his sugars are at goal before that meal, if we want to keep them controlled later in the day.  We decided to only hold insulin if his sugars before meals are lower than 70. -At today's visit, his sugar checked by his libre CGM was 42.  This came up to 90 towards the end of the visit.  We discussed that sugars this low measured by the CGM are usually approximately 20 points higher when measured with his meter.  I advised him to always check with his meter if sugars are lower than 70 on the CGM. -Upon patient and his wife's request, we will check today his insulin production. - I suggested to:  Patient Instructions  Please continue - Basaglar  8-10 units at bedtime - Humalog 2-4 units before the 3 meals, even if the sugars before the meals are normal. If sugar before meals <70, do not take Humalog with that meals.  Your glucose targets are: - 80-130 before meals - <180 after meals  When the CGM shows a value <70, please also check with your meter.  Please return in 3-4 months with your sugar log.   - today, HbA1c is 7.6% (higher) - continue checking sugars at different times of the day - check 4x a day, rotating checks - advised for yearly eye exams >> he is UTD - Return to clinic in 3-4 mo with sugar log    2. HL - Reviewed latest lipid panel from 03/2017: Fractions at goal with the exception of a slightly low HDL Lab Results  Component Value Date   CHOL 102 03/16/2017   HDL 36 (L) 03/16/2017   LDLCALC 52 03/16/2017   TRIG 71 03/16/2017   CHOLHDL 2.8 03/16/2017  - Continues Lipitor without side effects. -Recheck lipids today  3.  Protein calorie malnutrition -He is trying to gain weight -gained 8 pounds since last! -We started Humalog at last visit also for its anabolic effect -we will continue with this, taken before every meal, rather than only with dinner  Component     Latest Ref Rng &  Units 10/12/2018  Cholesterol     0 - 200 mg/dL 88  Triglycerides     0.0 - 149.0 mg/dL 55.0  HDL Cholesterol     >39.00 mg/dL 43.20  VLDL     0.0 - 40.0 mg/dL 11.0  LDL (calc)     0 - 99 mg/dL 34  Total CHOL/HDL Ratio      2  NonHDL      44.74  Hemoglobin A1C     4.0 - 5.6 % 7.6 (A)  C-Peptide     0.80 - 3.85 ng/mL 0.53 (L)  Glucose, Plasma     65 - 99 mg/dL 168 (H)   Lipid panel is excellent.  Unfortunately, his C-peptide is low, showing a degree of insulin deficiency.  For this reason, he will need to be at least on long-acting insulin from now on.  Philemon Kingdom, MD PhD [facility] Endocrinology

## 2018-10-15 LAB — C-PEPTIDE: C-Peptide: 0.53 ng/mL — ABNORMAL LOW (ref 0.80–3.85)

## 2018-10-15 LAB — GLUCOSE, FASTING: Glucose, Plasma: 168 mg/dL — ABNORMAL HIGH (ref 65–99)

## 2018-10-19 ENCOUNTER — Telehealth: Payer: Self-pay | Admitting: Cardiology

## 2018-10-19 NOTE — Telephone Encounter (Signed)
Thank you for the update.  I wonder if he might be able to come off of the Florinef at this point.  If he has not had any low systolic blood pressures under 100, would suggest stopping Florinef for now and then tracking blood pressure from there.

## 2018-10-19 NOTE — Telephone Encounter (Signed)
Returned pt call. Spoke with pt's wife. She states that the pt has had a day of higher bp readings. (135/82, 180/90, 184/84) and wanted to inform Dr. Domenic Polite. He is asymptomatic with these readings. I advised the patients wife to take his blood pressure over the next week and either call or drop off his readings for Dr. Domenic Polite to review as he may need medication adjustments. I informed her I will forward this to Dr. Domenic Polite to review and I will call her if he has any recommendations. She voiced understanding of plan , and thanked me for returning her call.

## 2018-10-19 NOTE — Telephone Encounter (Signed)
Patient would like to speak with nurse regarding BP readings and patient being started on new medications.  tg

## 2018-10-19 NOTE — Telephone Encounter (Signed)
Pt's wife made aware.

## 2018-10-23 ENCOUNTER — Telehealth: Payer: Self-pay | Admitting: Cardiology

## 2018-10-23 NOTE — Telephone Encounter (Signed)
Reviewed recent follow-up on blood pressure checks.  Would recommend staying off of Florinef at this time and continue Cozaar 25 mg daily.

## 2018-10-23 NOTE — Telephone Encounter (Signed)
Pt will stop florinef, and continue cozaar

## 2018-10-23 NOTE — Addendum Note (Signed)
Addended by: Barbarann Ehlers A on: 10/23/2018 10:47 AM   Modules accepted: Orders

## 2018-10-29 ENCOUNTER — Inpatient Hospital Stay: Payer: Medicare Other | Attending: Oncology

## 2018-10-29 ENCOUNTER — Inpatient Hospital Stay (HOSPITAL_BASED_OUTPATIENT_CLINIC_OR_DEPARTMENT_OTHER): Payer: Medicare Other | Admitting: Oncology

## 2018-10-29 ENCOUNTER — Inpatient Hospital Stay: Payer: Medicare Other

## 2018-10-29 ENCOUNTER — Telehealth: Payer: Self-pay | Admitting: Oncology

## 2018-10-29 VITALS — BP 117/67 | HR 82 | Temp 97.6°F | Resp 19 | Ht 68.0 in | Wt 131.7 lb

## 2018-10-29 DIAGNOSIS — I10 Essential (primary) hypertension: Secondary | ICD-10-CM

## 2018-10-29 DIAGNOSIS — C250 Malignant neoplasm of head of pancreas: Secondary | ICD-10-CM

## 2018-10-29 DIAGNOSIS — I252 Old myocardial infarction: Secondary | ICD-10-CM | POA: Insufficient documentation

## 2018-10-29 DIAGNOSIS — Z8507 Personal history of malignant neoplasm of pancreas: Secondary | ICD-10-CM | POA: Diagnosis not present

## 2018-10-29 DIAGNOSIS — Z87891 Personal history of nicotine dependence: Secondary | ICD-10-CM

## 2018-10-29 DIAGNOSIS — Z9221 Personal history of antineoplastic chemotherapy: Secondary | ICD-10-CM | POA: Diagnosis not present

## 2018-10-29 DIAGNOSIS — Z905 Acquired absence of kidney: Secondary | ICD-10-CM

## 2018-10-29 DIAGNOSIS — N289 Disorder of kidney and ureter, unspecified: Secondary | ICD-10-CM | POA: Insufficient documentation

## 2018-10-29 DIAGNOSIS — E119 Type 2 diabetes mellitus without complications: Secondary | ICD-10-CM

## 2018-10-29 DIAGNOSIS — Z95828 Presence of other vascular implants and grafts: Secondary | ICD-10-CM

## 2018-10-29 LAB — CBC WITH DIFFERENTIAL (CANCER CENTER ONLY)
Abs Immature Granulocytes: 0.01 10*3/uL (ref 0.00–0.07)
Basophils Absolute: 0 10*3/uL (ref 0.0–0.1)
Basophils Relative: 0 %
Eosinophils Absolute: 0.1 10*3/uL (ref 0.0–0.5)
Eosinophils Relative: 2 %
HCT: 36.4 % — ABNORMAL LOW (ref 39.0–52.0)
Hemoglobin: 11.1 g/dL — ABNORMAL LOW (ref 13.0–17.0)
Immature Granulocytes: 0 %
Lymphocytes Relative: 21 %
Lymphs Abs: 1.4 10*3/uL (ref 0.7–4.0)
MCH: 29.4 pg (ref 26.0–34.0)
MCHC: 30.5 g/dL (ref 30.0–36.0)
MCV: 96.3 fL (ref 80.0–100.0)
Monocytes Absolute: 0.8 10*3/uL (ref 0.1–1.0)
Monocytes Relative: 12 %
Neutro Abs: 4.3 10*3/uL (ref 1.7–7.7)
Neutrophils Relative %: 65 %
Platelet Count: 127 10*3/uL — ABNORMAL LOW (ref 150–400)
RBC: 3.78 MIL/uL — ABNORMAL LOW (ref 4.22–5.81)
RDW: 14.2 % (ref 11.5–15.5)
WBC Count: 6.6 10*3/uL (ref 4.0–10.5)
nRBC: 0 % (ref 0.0–0.2)

## 2018-10-29 LAB — CMP (CANCER CENTER ONLY)
ALT: 34 U/L (ref 0–44)
AST: 30 U/L (ref 15–41)
Albumin: 4 g/dL (ref 3.5–5.0)
Alkaline Phosphatase: 129 U/L — ABNORMAL HIGH (ref 38–126)
Anion gap: 9 (ref 5–15)
BUN: 37 mg/dL — ABNORMAL HIGH (ref 8–23)
CO2: 22 mmol/L (ref 22–32)
Calcium: 8.8 mg/dL — ABNORMAL LOW (ref 8.9–10.3)
Chloride: 107 mmol/L (ref 98–111)
Creatinine: 1.62 mg/dL — ABNORMAL HIGH (ref 0.61–1.24)
GFR, Est AFR Am: 47 mL/min — ABNORMAL LOW (ref >60)
GFR, Estimated: 41 mL/min — ABNORMAL LOW (ref >60)
Glucose, Bld: 125 mg/dL — ABNORMAL HIGH (ref 70–99)
Potassium: 4.6 mmol/L (ref 3.5–5.1)
Sodium: 138 mmol/L (ref 135–145)
Total Bilirubin: 0.4 mg/dL (ref 0.3–1.2)
Total Protein: 6.9 g/dL (ref 6.5–8.1)

## 2018-10-29 MED ORDER — HEPARIN SOD (PORK) LOCK FLUSH 100 UNIT/ML IV SOLN
500.0000 [IU] | Freq: Once | INTRAVENOUS | Status: AC
  Administered 2018-10-29: 500 [IU]
  Filled 2018-10-29: qty 5

## 2018-10-29 MED ORDER — SODIUM CHLORIDE 0.9% FLUSH
10.0000 mL | Freq: Once | INTRAVENOUS | Status: AC
  Administered 2018-10-29: 10 mL
  Filled 2018-10-29: qty 10

## 2018-10-29 NOTE — Progress Notes (Signed)
[facility] OFFICE PROGRESS NOTE   Diagnosis: Pancreas cancer  INTERVAL HISTORY:   [name] returns as scheduled.  He feels well.  Good appetite.  He has occasional diarrhea, but not on a daily basis.  No syncope events.  No pain.  He reports good blood sugar control.  Objective:  Vital signs in last 24 hours:  Blood pressure 117/67, pulse 82, temperature 97.6 F (36.4 C), temperature source Oral, resp. rate 19, height 5' 8"  (1.727 m), weight 131 lb 11.2 oz (59.7 kg), SpO2 100 %.    HEENT: Neck without mass Resp: Lungs clear bilaterally Cardio: Regular rate and rhythm GI: No hepatomegaly, no mass, no apparent ascites, nontender Vascular: No leg edema   Portacath/PICC-without erythema  Lab Results:  Lab Results  Component Value Date   WBC 6.6 10/29/2018   HGB 11.1 (L) 10/29/2018   HCT 36.4 (L) 10/29/2018   MCV 96.3 10/29/2018   PLT 127 (L) 10/29/2018   NEUTROABS 4.3 10/29/2018    CMP  Lab Results  Component Value Date   NA 138 10/29/2018   K 4.6 10/29/2018   CL 107 10/29/2018   CO2 22 10/29/2018   GLUCOSE 125 (H) 10/29/2018   BUN 37 (H) 10/29/2018   CREATININE 1.62 (H) 10/29/2018   CALCIUM 8.8 (L) 10/29/2018   PROT 6.9 10/29/2018   ALBUMIN 4.0 10/29/2018   AST 30 10/29/2018   ALT 34 10/29/2018   ALKPHOS 129 (H) 10/29/2018   BILITOT 0.4 10/29/2018   GFRNONAA 41 (L) 10/29/2018   GFRAA 47 (L) 10/29/2018     Medications: I have reviewed the patient's current medications.   Assessment/Plan: 1. Adenocarcinoma of the pancreas head/neck, status post an EUS biopsy 08/02/2017 confirming adenocarcinoma ? EUS 08/02/2017-pancreas head/neck mass, abutment of the portal vein, no peripancreatic adenopathy ? MRI abdomen 07/27/2017-head/neck pancreas mass with mass-effect on the portal splenic confluence, no involvement of the celiac axis or superior mesenteric artery, no evidence of metastatic disease ? PET scan 08/31/2017-very hypermetabolism at  the pancreatic head mass, no evidence of metastatic disease. The isolated right lower lobe nodule below PET resolution, left sided renal mass with decreased activity relative to the normal adjacent kidney ? Cycle 1 FOLFIRINOX 09/16/2017 ? Cycle 2 FOLFOX 10/16/2017 ? Cycle 3 FOLFOX 10/30/2017 ? Cycle 4 FOLFIRINOX 11/14/2017 (irinotecan resumed with a dose reduction) ? Cycle 5 FOLFIRINOX3/20/2019 (Oxaliplatin dose reduced due to thrombocytopenia) ? Cycle 6 FOLFIRINOX4/11/2017 ? CTs 12/25/2017-stable right lower lobe nodule, decrease in size of the pancreas head mass, stable left kidney mass ? Pancreaticoduodenectomy and left nephrectomy 02/19/2018,pT1cpN1, 2/18 lymph nodes positive, lymphovascular and perineural invasion present, mild to moderate treatment effect, tumor involved adherent portal vein with negative portal vein margins ? Cycle 1 adjuvant gemcitabine/Abraxane 04/25/2018 ? Cycle 2 adjuvant gemcitabine/Abraxane 05/16/2018 ? Cycle 3 adjuvant gemcitabine/Abraxane 05/31/2018 ? Cycle 4 adjuvant gemcitabine/Abraxane 06/13/2018 ? Cycle 5 adjuvant gemcitabine/Abraxane 06/27/2018 ? Cycle 6 adjuvant gemcitabine/Abraxane 07/11/2018  2. 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 Barry Dienes  9. Diarrhea-potentially related to pancreas insufficiency, he began a trial of pancreatic enzyme replacement 05/16/2018: Diarrhea resolved  10. Dehydrationon 03/19/2018-statustreatments with IV fluidsforpast 2 weeks  Admission 04/03/2018 with  dehydration and hypotension  Persistent  orthostatic hypotension-compression stockings and Florinef added 04/10/2018 improved, Florinef discontinued  11.  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  Renal insufficiency    Disposition: [name] is in clinical remission from pancreas cancer.  The hemoglobin and platelets are better today.  He reports he is scheduled to see urology.  I will refer him to Dr. Barry Dienes for removal of the Port-A-Cath.  He will return for an office and lab visit in 3 months.  [name], MD  10/29/2018  10:35 AM

## 2018-10-29 NOTE — Telephone Encounter (Signed)
Scheduled appt per 2/17 los.  Printed calendar and avs.

## 2018-10-30 LAB — CANCER ANTIGEN 19-9: CA 19-9: 9 U/mL (ref 0–35)

## 2018-11-01 ENCOUNTER — Other Ambulatory Visit: Payer: Self-pay | Admitting: Cardiology

## 2018-11-02 ENCOUNTER — Other Ambulatory Visit: Payer: Self-pay

## 2018-11-02 MED ORDER — PEN NEEDLES 31G X 6 MM MISC: 2 refills | Status: DC

## 2018-11-12 DIAGNOSIS — Z7902 Long term (current) use of antithrombotics/antiplatelets: Secondary | ICD-10-CM | POA: Diagnosis not present

## 2018-11-12 DIAGNOSIS — K8681 Exocrine pancreatic insufficiency: Secondary | ICD-10-CM | POA: Diagnosis not present

## 2018-11-12 DIAGNOSIS — C250 Malignant neoplasm of head of pancreas: Secondary | ICD-10-CM | POA: Diagnosis not present

## 2018-11-12 DIAGNOSIS — Z95828 Presence of other vascular implants and grafts: Secondary | ICD-10-CM | POA: Diagnosis not present

## 2018-11-21 DIAGNOSIS — J342 Deviated nasal septum: Secondary | ICD-10-CM | POA: Diagnosis not present

## 2018-11-21 DIAGNOSIS — H903 Sensorineural hearing loss, bilateral: Secondary | ICD-10-CM | POA: Diagnosis not present

## 2018-11-21 DIAGNOSIS — H6121 Impacted cerumen, right ear: Secondary | ICD-10-CM | POA: Diagnosis not present

## 2018-11-27 ENCOUNTER — Telehealth: Payer: Self-pay | Admitting: Internal Medicine

## 2018-11-27 NOTE — Telephone Encounter (Signed)
Manuel Olson with [facility] ph# [phone] called re: requesting that visit notes -recent comments regarding Freestyle Glucose Monitor be resent to fax# [phone]

## 2018-11-28 NOTE — Telephone Encounter (Signed)
I faxed this yesterday with confirmation it went through but I will fax it again now.

## 2018-12-10 ENCOUNTER — Other Ambulatory Visit: Payer: Self-pay

## 2018-12-10 ENCOUNTER — Inpatient Hospital Stay: Payer: Medicare Other | Attending: Oncology

## 2018-12-10 DIAGNOSIS — Z452 Encounter for adjustment and management of vascular access device: Secondary | ICD-10-CM | POA: Insufficient documentation

## 2018-12-10 DIAGNOSIS — C250 Malignant neoplasm of head of pancreas: Secondary | ICD-10-CM

## 2018-12-10 DIAGNOSIS — Z8507 Personal history of malignant neoplasm of pancreas: Secondary | ICD-10-CM | POA: Diagnosis not present

## 2018-12-10 DIAGNOSIS — Z95828 Presence of other vascular implants and grafts: Secondary | ICD-10-CM

## 2018-12-10 MED ORDER — HEPARIN SOD (PORK) LOCK FLUSH 100 UNIT/ML IV SOLN
500.0000 [IU] | Freq: Once | INTRAVENOUS | Status: AC
  Administered 2018-12-10: 500 [IU]
  Filled 2018-12-10: qty 5

## 2018-12-10 MED ORDER — SODIUM CHLORIDE 0.9% FLUSH
10.0000 mL | Freq: Once | INTRAVENOUS | Status: AC
  Administered 2018-12-10: 10 mL
  Filled 2018-12-10: qty 10

## 2018-12-26 ENCOUNTER — Other Ambulatory Visit: Payer: Self-pay | Admitting: Cardiology

## 2019-01-04 DIAGNOSIS — H35352 Cystoid macular degeneration, left eye: Secondary | ICD-10-CM | POA: Diagnosis not present

## 2019-01-07 ENCOUNTER — Telehealth: Payer: Self-pay | Admitting: Cardiology

## 2019-01-07 DIAGNOSIS — M316 Other giant cell arteritis: Secondary | ICD-10-CM

## 2019-01-07 DIAGNOSIS — G453 Amaurosis fugax: Secondary | ICD-10-CM

## 2019-01-07 NOTE — Telephone Encounter (Addendum)
Wife informed and verbalized understanding of plan. Lab order faxed [facility]

## 2019-01-07 NOTE — Telephone Encounter (Signed)
Emi Belfast, MD        Dr Maudie Mercury asking for you to call him. He is with [facility]   [phone]   Thank you,   Colletta Maryland    I spoke with Dr. Katherine Roan at the [facility] today.  Patient was seen recently with intermittent visual changes and amaurosis fugax. No obvious embolic findings on eye exam.  It was requested that we assist arranging further testing including a CRP and ESR (evaluate for giant cell arteritis) and also carotid Dopplers.  We will get these tests arranged and they will be available in Epic for his ophthalmology follow-up.   Satira Sark, M.D., F.A.C.C.

## 2019-01-07 NOTE — Addendum Note (Signed)
Addended by: Merlene Laughter on: 01/07/2019 12:18 PM   Modules accepted: Orders

## 2019-01-08 ENCOUNTER — Ambulatory Visit (INDEPENDENT_AMBULATORY_CARE_PROVIDER_SITE_OTHER): Payer: Medicare Other | Admitting: Internal Medicine

## 2019-01-08 ENCOUNTER — Other Ambulatory Visit: Payer: Self-pay

## 2019-01-08 ENCOUNTER — Encounter: Payer: Self-pay | Admitting: Internal Medicine

## 2019-01-08 DIAGNOSIS — I255 Ischemic cardiomyopathy: Secondary | ICD-10-CM

## 2019-01-08 DIAGNOSIS — E1159 Type 2 diabetes mellitus with other circulatory complications: Secondary | ICD-10-CM

## 2019-01-08 DIAGNOSIS — E46 Unspecified protein-calorie malnutrition: Secondary | ICD-10-CM | POA: Diagnosis not present

## 2019-01-08 DIAGNOSIS — E782 Mixed hyperlipidemia: Secondary | ICD-10-CM

## 2019-01-08 DIAGNOSIS — E1165 Type 2 diabetes mellitus with hyperglycemia: Secondary | ICD-10-CM

## 2019-01-08 MED ORDER — BASAGLAR KWIKPEN 100 UNIT/ML ~~LOC~~ SOPN: 10.0000 [IU] | PEN_INJECTOR | Freq: Every day | SUBCUTANEOUS | 5 refills | Status: DC

## 2019-01-08 NOTE — Patient Instructions (Addendum)
Please increase: - Basaglar 10 units at bedtime - Humalog 3-5 units before the 3 meals, even if the sugars before the meals are normal. If sugar before meal <70, do not take Humalog with that meal.  Your glucose targets are: - 80-130 before meals - <180 after meals  Please return in 4 months with your sugar log.

## 2019-01-08 NOTE — Progress Notes (Signed)
Patient ID: Manuel Olson, male   DOB: 24-Jun-1946, 73 y.o.   MRN: [id number]   Patient location: Home My location: Office  Referring Provider: Dr. Learta Codding  I connected with the patient on 01/08/19 at 10:03 AM EDT by a video enabled telemedicine application and verified that I am speaking with the correct person.   I discussed the limitations of evaluation and management by telemedicine and the availability of in person appointments. The patient expressed understanding and agreed to proceed.   Details of the encounter are shown below.  HPI: Manuel Olson is a 73 y.o.-year-old male, initially referred by his oncologist, Dr. Benay Spice, presenting for follow-up for DM2, dx in 01/2016, insulin-dependent, uncontrolled, with complications (CAD - s/p AMI 11/2016 - s/p 4x DES; Ao stenosis). He saw Dr. Dorris Fetch before.  Last visit 3 months ago.  Patient has a significant history of pancreatic cancer diagnosed 07/2017.  He started chemotherapy 09/2017.  Since then, he also had a kidney removed for renal cell carcinoma and at the same time he had pancreatic resection (head of the pancreas) 02/2018.  He was found to have metastases in his lymph nodes.  He restarted chemotherapy after the surgery.  He finished chemotherapy 06/2018.  He feels well, has good energy.  We checked his insulin production after his pancreatic resection and this was low: Component     Latest Ref Rng & Units 10/12/2018  C-Peptide     0.80 - 3.85 ng/mL 0.53 (L)  Glucose, Plasma     65 - 99 mg/dL 168 (H)   Last hemoglobin A1c was: Lab Results  Component Value Date   HGBA1C 7.6 (A) 10/12/2018   HGBA1C 7.1 (A) 06/25/2018   HGBA1C 6.4 (H) 02/09/2018   Pt was on a regimen of: - Glipizide 5 mg daily  - Tresiba 20 units at bedtime (samples, cannot afford this)    He is currently on:  - Basaglar 8-10 >> 9 units at bedtime - Humalog 2-4 units before the 3 meals, even if the sugars before the meals are normal. If sugar  before meals <70, do not take Humalog with that meals.  Pt was checking his sugar 4 times a day with his freestyle libre CGM: - am:  52, 55, 59, 62-120, 135 >> 89-135, 166 >> 86-139, 166 - 2h after b'fast: n/c >> 137 - before lunch: 140-301 >> 95-151, 164 >> n/c >> 123-208, 234 - 2h after lunch: n/c - before dinner: 140 >> 152-260 >> 100-166, 185, 215 - 2h after dinner: n/c - bedtime:  143, 159-255, 332 >> 104-262 >> 164-235, 337 - nighttime: n/c Lowest sugar was 41 >> 52 >> 42 (libre) >> 86; it is unclear at which level he has hypoglycemia awareness. Highest sugar was 332 >> 262 >> 337.  Glucometer: True Metrix  Pt's meals are: - Breakfast: bacon + eggs + English muffin + occas. Tomato juice   - Lunch: grilled ham and cheese sandwich + potato chips - Dinner: chicken + veggies + starch - Snacks: fruit cup, pudding, fig newtons, icecreams  + CKD, last BUN/creatinine:  Lab Results  Component Value Date   BUN 37 (H) 10/29/2018   BUN 27 (H) 09/18/2018   CREATININE 1.62 (H) 10/29/2018   CREATININE 1.48 (H) 09/18/2018  On Cozaar. + HL; last set of lipids: Lab Results  Component Value Date   CHOL 88 10/12/2018   HDL 43.20 10/12/2018   LDLCALC 34 10/12/2018   TRIG 55.0 10/12/2018   CHOLHDL 2  10/12/2018  On chart 20. - last eye exam was in 07/2018: No DR."I had a stroke in my eye". + h/o LASER sx'es. - no  numbness but has tingling in his feet  Pt has FH of DM in brother.  He also has a history of HTN.  ROS: Constitutional: no weight gain/no weight loss, no fatigue, no subjective hyperthermia, no subjective hypothermia Eyes: no blurry vision, no xerophthalmia ENT: no sore throat, no nodules palpated in neck, no dysphagia, no odynophagia, no hoarseness Cardiovascular: no CP/no SOB/no palpitations/no leg swelling Respiratory: no cough/no SOB/no wheezing Gastrointestinal: no N/no V/no D/no C/no acid reflux Musculoskeletal: no muscle aches/no joint aches Skin: no rashes, no  hair loss Neurological: no tremors/no numbness/no tingling/no dizziness  I reviewed pt's medications, allergies, PMH, social hx, family hx, and changes were documented in the history of present illness. Otherwise, unchanged from my initial visit note.  Past Medical History:  Diagnosis Date  . Arthritis   . CAD (coronary artery disease)    a. 11/2016 in [place]: STEMI s/p DES to mid LAD, DES to diagonal, DES x 2 to proximal and mid RCA   . Essential hypertension   . Former smoker   . GERD (gastroesophageal reflux disease)   . History of stroke   . Hyperlipidemia   . Myocardial infarction ([place])   . Pancreatic cancer ([place])   . Pneumonia   . Renal mass   . S/P TAVR (transcatheter aortic valve replacement)    a. 08/2017:  Edwards Sapien 3 THV (size 26 mm, model #9600CM26A , serial # [id number])  . Severe aortic stenosis    a. 08/2017: s/p TAVR by Dr. Angelena Form and Dr. Cyndia Bent.   . Type 2 diabetes mellitus ([place])    Past Surgical History:  Procedure Laterality Date  . APPENDECTOMY    . CARDIAC CATHETERIZATION    . EUS N/A 08/02/2017   Procedure: UPPER ENDOSCOPIC ULTRASOUND (EUS) RADIAL;  Surgeon: [name], MD;  Location: [facility];  Service: Endoscopy;  Laterality: N/A;  . EYE SURGERY Left   . HAND SURGERY Bilateral   . HERNIA REPAIR Right   . LAPAROSCOPY N/A 02/19/2018   Procedure: DIAGNOSTIC LAPAROSCOPY, ERAS PATHWAY;  Surgeon: [name], MD;  Location: [facility];  Service: General;  Laterality: N/A;  . NEPHRECTOMY Left 02/19/2018   Procedure: OPEN LEFT RADICAL NEPHRECTOMY;  Surgeon: Cleon Gustin, MD;  Location: [facility];  Service: Urology;  Laterality: Left;  . PORTACATH PLACEMENT N/A 09/15/2017   Procedure: INSERTION PORT-A-CATH;  Surgeon: [name], MD;  Location: [facility];  Service: General;  Laterality: N/A;  . RIGHT/LEFT HEART CATH AND CORONARY ANGIOGRAPHY N/A 07/05/2017   Procedure: RIGHT/LEFT HEART CATH AND CORONARY ANGIOGRAPHY;  Surgeon: [name], MD;  Location: [facility];  Service: Cardiovascular;  Laterality: N/A;  . SHOULDER ARTHROSCOPY WITH ROTATOR CUFF REPAIR Left   . TEE WITHOUT CARDIOVERSION N/A 08/22/2017   Procedure: TRANSESOPHAGEAL ECHOCARDIOGRAM (TEE);  Surgeon: Burnell Blanks, MD;  Location: [facility];  Service: Open Heart Surgery;  Laterality: N/A;  . TRANSCATHETER AORTIC VALVE REPLACEMENT, TRANSFEMORAL N/A 08/22/2017   Procedure: TRANSCATHETER AORTIC VALVE REPLACEMENT, TRANSFEMORAL;  Surgeon: Burnell Blanks, MD;  Location: [place] [facility];  Service: Open Heart Surgery;  Laterality: N/A;  . WHIPPLE PROCEDURE N/A 02/19/2018   Procedure: WHIPPLE PROCEDURE;  Surgeon: [name], MD;  Location: [facility];  Service: General;  Laterality: N/A;   Social History   Socioeconomic History  . Marital status: Married  Spouse name: Not on file  . Number of children: Not on file  Social Needs  Occupational History  .  Retired  Tobacco Use  . Smoking status: Former Smoker    Packs/day: 1.00    Years: 57.00    Pack years: 57.00    Types: Cigarettes    Start date: 09/12/1958    Last attempt to quit: 2018    Years since quitting: 1.5  . Smokeless tobacco: Never Used  Substance and Sexual Activity  . Alcohol use: No  . Drug use: No   Current Outpatient Medications on File Prior to Visit  Medication Sig Dispense Refill  . atorvastatin (LIPITOR) 20 MG tablet TAKE 1 TABLET BY MOUTH ONCE DAILY. 90 tablet 0  . clopidogrel (PLAVIX) 75 MG tablet TAKE 1 TABLET BY MOUTH ONCE DAILY. 30 tablet 10  . CREON 36000 units CPEP capsule TAKE 1 CAPSULE BY MOUTH BEFORE EACH MEAL AND SNACK. TAKE UP TO 6 CAPSULES PER DAY. 180 capsule 4  . Insulin Glargine (BASAGLAR KWIKPEN) 100 UNIT/ML SOPN Inject 0.14 mLs (14 Units total) into the skin at bedtime. 5 pen 5  . insulin lispro (HUMALOG KWIKPEN) 100 UNIT/ML KiwkPen Inject 0.04-0.06 mLs (4-6 Units total) into the skin 3 (three) times daily. 15 mL 5  . Insulin Pen Needle (PEN NEEDLES)  31G X 6 MM MISC Use as directed to check blood sugar 4 times a day. 400 each 2  . lidocaine-prilocaine (EMLA) cream lidocaine-prilocaine 2.5 %-2.5 % topical cream    . losartan (COZAAR) 25 MG tablet Take 1 tablet (25 mg total) by mouth daily. 90 tablet 3   No current facility-administered medications on file prior to visit.    No Known Allergies Family History  Problem Relation Age of Onset  . Lupus Mother   . CAD Brother   . Hypertension Brother     PE: There were no vitals taken for this visit. Wt Readings from Last 3 Encounters:  10/29/18 131 lb 11.2 oz (59.7 kg)  10/12/18 136 lb (61.7 kg)  09/27/18 134 lb (60.8 kg)   Constitutional:  in NAD  The physical exam was not performed (virtual visit).  ASSESSMENT: 1. DM2, non-insulin-dependent, uncontrolled, with complications - CAD - s/p AMI 11/2016 - s/p DES x4 -  Ao stenosis  2. HL  3.  Protein caloric malnutrition  PLAN:  1. Patient with history of initially controlled diabetes, with deteriorating control around the time of his pancreatic cancer diagnosis at the end of 2018.  After his diagnosis and surgery, we started insulin.  At last visit, his C-peptide was low, so we will need to continue insulin.  As he has protein caloric malnutrition and trying to gain weight, we did not start any medications that can lead to weight loss.  He is on basal-bolus insulin regimen.  At last visit, per review his CGM downloaded report, his sugars were at goal in the morning but they were increasing throughout the day.  Upon questioning, he was not taking Humalog before dinner or before a meal if sugars were at goal.  We discussed that he will need to do so, to cover the meal.  -He continues to use his freestyle libre CGM and checks his sugars more than 4 times a day -At this visit, sugars are close to goal in the morning, and they increase before lunch and after dinner.  The sugars are usually higher when he uses a lower dose of Humalog before  his meals.  We  discussed about going up to 5 units before a meal if needed.  In the meantime, since all of the sugars are slightly higher than goal, we will increase his Basaglar dose by 1 unit.  He is very insulin sensitive. - I suggested to:  Please increase: - Basaglar 10 units at bedtime - Humalog 3-5 units before the 3 meals, even if the sugars before the meals are normal. If sugar before meal <70, do not take Humalog with that meal.  Your glucose targets are: - 80-130 before meals - <180 after meals  Please return in 4 months with your sugar log.   -We will recheck his HbA1c when he returns to the clinic - continue checking sugars at different times of the day - check 4x a day, rotating checks -with the libre CGM - advised for yearly eye exams >> he is UTD - Return to clinic in 4 mo with sugar log    2. HL - Reviewed latest lipid panel from last visit: All fractions at goal Lab Results  Component Value Date   CHOL 88 10/12/2018   HDL 43.20 10/12/2018   LDLCALC 34 10/12/2018   TRIG 55.0 10/12/2018   CHOLHDL 2 10/12/2018  - Continues Lipitor without side effects.  3.  Protein calorie malnutrition -He is trying to gain weight and before last visit he gained 8 pounds.  Since then, unfortunately, he lost few lbs initially, then gained them back - he feels his weight is stable since last OV. -We started Humalog also for its anabolic effect -continue for now  Philemon Kingdom, MD PhD [facility]

## 2019-01-09 ENCOUNTER — Other Ambulatory Visit (HOSPITAL_COMMUNITY)
Admission: RE | Admit: 2019-01-09 | Discharge: 2019-01-09 | Disposition: A | Discharge status: Home / Self Care | Payer: Medicare Other | Source: Ambulatory Visit | Attending: Cardiology | Admitting: Cardiology

## 2019-01-09 ENCOUNTER — Other Ambulatory Visit: Payer: Self-pay

## 2019-01-09 DIAGNOSIS — G453 Amaurosis fugax: Secondary | ICD-10-CM | POA: Diagnosis not present

## 2019-01-09 LAB — SEDIMENTATION RATE: Sed Rate: 10 mm/hr (ref 0–16)

## 2019-01-09 LAB — C-REACTIVE PROTEIN: CRP: <0.8 mg/dL (ref <1.0)

## 2019-01-10 ENCOUNTER — Telehealth: Payer: Self-pay | Admitting: *Deleted

## 2019-01-10 NOTE — Telephone Encounter (Signed)
-----   Message from Satira Sark, MD sent at 01/09/2019  1:21 PM EDT ----- Results reviewed.  Sed rate normal.

## 2019-01-10 NOTE — Telephone Encounter (Signed)
-----   Message from Satira Sark, MD sent at 01/09/2019  1:10 PM EDT ----- Results reviewed.  CRP normal range.

## 2019-01-10 NOTE — Telephone Encounter (Signed)
Patient and wife informed. Awaiting carotid doppler to be scheduled.

## 2019-01-14 ENCOUNTER — Other Ambulatory Visit (HOSPITAL_COMMUNITY): Payer: Self-pay | Admitting: Urology

## 2019-01-14 ENCOUNTER — Other Ambulatory Visit: Payer: Self-pay

## 2019-01-14 ENCOUNTER — Ambulatory Visit (HOSPITAL_COMMUNITY)
Admission: RE | Admit: 2019-01-14 | Discharge: 2019-01-14 | Disposition: A | Discharge status: Home / Self Care | Payer: Medicare Other | Source: Ambulatory Visit | Attending: Urology | Admitting: Urology

## 2019-01-14 DIAGNOSIS — C642 Malignant neoplasm of left kidney, except renal pelvis: Secondary | ICD-10-CM | POA: Diagnosis not present

## 2019-01-14 DIAGNOSIS — R05 Cough: Secondary | ICD-10-CM | POA: Diagnosis not present

## 2019-01-16 ENCOUNTER — Other Ambulatory Visit: Payer: Self-pay | Admitting: Cardiology

## 2019-01-18 DIAGNOSIS — C642 Malignant neoplasm of left kidney, except renal pelvis: Secondary | ICD-10-CM | POA: Diagnosis not present

## 2019-01-23 ENCOUNTER — Telehealth: Payer: Self-pay | Admitting: Internal Medicine

## 2019-01-23 NOTE — Telephone Encounter (Signed)
Per patients wife the current RX for Basaglar and Humalog [place] need to be updated to reflect the new dosing instructions and amounts.  The pharmacy is the [facility] in [place]

## 2019-01-24 MED ORDER — INSULIN LISPRO (1 UNIT DIAL) 100 UNIT/ML (KWIKPEN): PEN_INJECTOR | SUBCUTANEOUS | 11 refills | Status: DC

## 2019-01-24 MED ORDER — BASAGLAR KWIKPEN 100 UNIT/ML ~~LOC~~ SOPN: 10.0000 [IU] | PEN_INJECTOR | Freq: Every day | SUBCUTANEOUS | 5 refills | Status: DC

## 2019-01-24 NOTE — Telephone Encounter (Signed)
RX updated and refill sent.

## 2019-01-28 ENCOUNTER — Inpatient Hospital Stay: Payer: Medicare Other | Attending: Oncology

## 2019-01-28 ENCOUNTER — Inpatient Hospital Stay: Payer: Medicare Other

## 2019-01-28 ENCOUNTER — Telehealth: Payer: Self-pay | Admitting: Nurse Practitioner

## 2019-01-28 ENCOUNTER — Encounter: Payer: Self-pay | Admitting: Nurse Practitioner

## 2019-01-28 ENCOUNTER — Inpatient Hospital Stay (HOSPITAL_BASED_OUTPATIENT_CLINIC_OR_DEPARTMENT_OTHER): Payer: Medicare Other | Admitting: Nurse Practitioner

## 2019-01-28 ENCOUNTER — Other Ambulatory Visit: Payer: Self-pay

## 2019-01-28 VITALS — BP 153/80 | HR 78 | Temp 98.7°F | Resp 18 | Ht 68.0 in | Wt 134.9 lb

## 2019-01-28 DIAGNOSIS — Z9221 Personal history of antineoplastic chemotherapy: Secondary | ICD-10-CM | POA: Diagnosis not present

## 2019-01-28 DIAGNOSIS — Z8507 Personal history of malignant neoplasm of pancreas: Secondary | ICD-10-CM | POA: Insufficient documentation

## 2019-01-28 DIAGNOSIS — I10 Essential (primary) hypertension: Secondary | ICD-10-CM | POA: Diagnosis not present

## 2019-01-28 DIAGNOSIS — I252 Old myocardial infarction: Secondary | ICD-10-CM

## 2019-01-28 DIAGNOSIS — Z905 Acquired absence of kidney: Secondary | ICD-10-CM | POA: Insufficient documentation

## 2019-01-28 DIAGNOSIS — Z87891 Personal history of nicotine dependence: Secondary | ICD-10-CM | POA: Diagnosis not present

## 2019-01-28 DIAGNOSIS — I255 Ischemic cardiomyopathy: Secondary | ICD-10-CM

## 2019-01-28 DIAGNOSIS — N2889 Other specified disorders of kidney and ureter: Secondary | ICD-10-CM | POA: Insufficient documentation

## 2019-01-28 DIAGNOSIS — E119 Type 2 diabetes mellitus without complications: Secondary | ICD-10-CM | POA: Insufficient documentation

## 2019-01-28 DIAGNOSIS — C250 Malignant neoplasm of head of pancreas: Secondary | ICD-10-CM

## 2019-01-28 DIAGNOSIS — Z95828 Presence of other vascular implants and grafts: Secondary | ICD-10-CM

## 2019-01-28 DIAGNOSIS — Z452 Encounter for adjustment and management of vascular access device: Secondary | ICD-10-CM | POA: Diagnosis not present

## 2019-01-28 LAB — BASIC METABOLIC PANEL - CANCER CENTER ONLY
Anion gap: 9 (ref 5–15)
BUN: 26 mg/dL — ABNORMAL HIGH (ref 8–23)
CO2: 19 mmol/L — ABNORMAL LOW (ref 22–32)
Calcium: 7.9 mg/dL — ABNORMAL LOW (ref 8.9–10.3)
Chloride: 110 mmol/L (ref 98–111)
Creatinine: 1.6 mg/dL — ABNORMAL HIGH (ref 0.61–1.24)
GFR, Est AFR Am: 48 mL/min — ABNORMAL LOW (ref >60)
GFR, Estimated: 42 mL/min — ABNORMAL LOW (ref >60)
Glucose, Bld: 232 mg/dL — ABNORMAL HIGH (ref 70–99)
Potassium: 4.3 mmol/L (ref 3.5–5.1)
Sodium: 138 mmol/L (ref 135–145)

## 2019-01-28 MED ORDER — HEPARIN SOD (PORK) LOCK FLUSH 100 UNIT/ML IV SOLN
500.0000 [IU] | Freq: Once | INTRAVENOUS | Status: AC
  Administered 2019-01-28: 500 [IU]
  Filled 2019-01-28: qty 5

## 2019-01-28 MED ORDER — SODIUM CHLORIDE 0.9% FLUSH
10.0000 mL | Freq: Once | INTRAVENOUS | Status: AC
  Administered 2019-01-28: 10 mL
  Filled 2019-01-28: qty 10

## 2019-01-28 NOTE — Progress Notes (Addendum)
[place] OFFICE PROGRESS NOTE   Diagnosis: Pancreas cancer  INTERVAL HISTORY:   Manuel Olson returns as scheduled.  He feels well.  He has a good appetite.  No abdominal pain.  He notes easy bruising.  He denies bleeding.  No nausea or vomiting.  Rare loose stool.  No fever, cough, shortness of breath.  Objective:  Vital signs in last 24 hours:  Blood pressure (!) 153/80, pulse 78, temperature 98.7 F (37.1 C), temperature source Oral, resp. rate 18, height 5' 8"  (1.727 m), weight 134 lb 14.4 oz (61.2 kg), SpO2 100 %.    Lymphatics: No palpable cervical, supraclavicular, axillary or inguinal lymph nodes. GI: No hepatomegaly.  No mass.  No apparent ascites.  Nontender. Vascular: No leg edema.  Skin: Large ecchymosis at the right forearm.  Smaller ecchymosis left mid arm. Port-A-Cath without erythema.  Lab Results:  Lab Results  Component Value Date   WBC 6.6 10/29/2018   HGB 11.1 (L) 10/29/2018   HCT 36.4 (L) 10/29/2018   MCV 96.3 10/29/2018   PLT 127 (L) 10/29/2018   NEUTROABS 4.3 10/29/2018    Imaging:  No results found.  Medications: I have reviewed the patient's current medications.  Assessment/Plan: 1. Adenocarcinoma of the pancreas head/neck, status post an EUS biopsy 08/02/2017 confirming adenocarcinoma ? EUS 08/02/2017-pancreas head/neck mass, abutment of the portal vein, no peripancreatic adenopathy ? MRI abdomen 07/27/2017-head/neck pancreas mass with mass-effect on the portal splenic confluence, no involvement of the celiac axis or superior mesenteric artery, no evidence of metastatic disease ? PET scan 08/31/2017-very hypermetabolism at the pancreatic head mass, no evidence of metastatic disease. The isolated right lower lobe nodule below PET resolution, left sided renal mass with decreased activity relative to the normal adjacent kidney ? Cycle 1 FOLFIRINOX 09/16/2017 ? Cycle 2 FOLFOX 10/16/2017 ? Cycle 3 FOLFOX 10/30/2017 ? Cycle 4  FOLFIRINOX 11/14/2017 (irinotecan resumed with a dose reduction) ? Cycle 5 FOLFIRINOX3/20/2019 (Oxaliplatin dose reduced due to thrombocytopenia) ? Cycle 6 FOLFIRINOX4/11/2017 ? CTs 12/25/2017-stable right lower lobe nodule, decrease in size of the pancreas head mass, stable left kidney mass ? Pancreaticoduodenectomy and left nephrectomy 02/19/2018,pT1cpN1, 2/18 lymph nodes positive, lymphovascular and perineural invasion present, mild to moderate treatment effect, tumor involved adherent portal vein with negative portal vein margins ? Cycle 1 adjuvant gemcitabine/Abraxane 04/25/2018 ? Cycle 2 adjuvant gemcitabine/Abraxane 05/16/2018 ? Cycle 3 adjuvant gemcitabine/Abraxane 05/31/2018 ? Cycle 4 adjuvant gemcitabine/Abraxane 06/13/2018 ? Cycle 5 adjuvant gemcitabine/Abraxane 06/27/2018 ? Cycle 6 adjuvant gemcitabine/Abraxane 07/11/2018  2. 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 Barry Dienes  9. Diarrhea-potentially related to pancreas insufficiency, he began a trial of pancreatic enzyme replacement 05/16/2018: Diarrhea resolved  10. 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  Renal insufficiency   Disposition: Manuel Olson remains in clinical remission from pancreas cancer.  We will  follow-up on the CA-19-9 tumor marker from today.  He will return for a Port-A-Cath flush in 6 weeks.  He will cancel this appointment if the Port-A-Cath is removed.  He will return for lab and follow-up in 3 months.  He will contact the office in the interim with any problems.  Patient seen with Dr. Benay Spice.    [name] ANP/GNP-BC   01/28/2019  10:05 AM This was a shared visit with [name].  Manuel Olson appears stable.  He is in clinical remission from pancreas cancer.  We will follow-up on the CA 19-9 from today and decide on removing the Port-A-Cath.  Julieanne Manson, MD

## 2019-01-28 NOTE — Telephone Encounter (Signed)
Scheduled appt per 5/18 los.  A calendar will be mailed out.

## 2019-01-29 ENCOUNTER — Telehealth: Payer: Self-pay | Admitting: Cardiology

## 2019-01-29 ENCOUNTER — Telehealth: Payer: Self-pay

## 2019-01-29 LAB — CANCER ANTIGEN 19-9: CA 19-9: 7 U/mL (ref 0–35)

## 2019-01-29 NOTE — Telephone Encounter (Signed)
-----   Message from Owens Shark, NP sent at 01/29/2019  1:29 PM EDT ----- Please let him know the CA-19-9 remains in normal range.

## 2019-01-29 NOTE — Telephone Encounter (Signed)
Per [name] TC to Mr. Lusby to give results of CA 19.9 (result 7) message given to his wife Manuel Olson informed her that test results remain stable. Pt.'s wife verbalized understanding. Stated she would relay the message to Mr. Hardacre she also  Inquired about appointment with Dr.Byerly stated he should get a call from scheduling. Pt's wife verbalized understanding no further problems or concerns noted.

## 2019-01-29 NOTE — Telephone Encounter (Signed)
° ° °  COVID-19 Pre-Screening Questions:   In the past 7 to 10 days have you had a cough,  shortness of breath, headache, congestion, fever, body aches, chills, sore throat, or sudden loss of taste or sense of smell?  No    Have you been around anyone with known Covid 19.  NO    Have you been around anyone who is awaiting Covid 19 test results in the past 7 to 10 days?  No   Have you been around anyone who has been exposed to Covid 19, or has mentioned symptoms of Covid 19 within the past 7 to 10 days? No  If you have any concerns/questions about symptoms patients report during screening (either on the phone or at threshold). Contact the provider seeing the patient or DOD for further guidance.  If neither are available contact a member of the leadership team.

## 2019-01-30 ENCOUNTER — Other Ambulatory Visit: Payer: Self-pay

## 2019-01-30 ENCOUNTER — Other Ambulatory Visit: Payer: Self-pay | Admitting: Cardiology

## 2019-01-30 ENCOUNTER — Ambulatory Visit (INDEPENDENT_AMBULATORY_CARE_PROVIDER_SITE_OTHER): Payer: Medicare Other

## 2019-01-30 ENCOUNTER — Telehealth: Payer: Self-pay | Admitting: *Deleted

## 2019-01-30 DIAGNOSIS — G453 Amaurosis fugax: Secondary | ICD-10-CM

## 2019-01-30 DIAGNOSIS — I6523 Occlusion and stenosis of bilateral carotid arteries: Secondary | ICD-10-CM

## 2019-01-30 DIAGNOSIS — M316 Other giant cell arteritis: Secondary | ICD-10-CM

## 2019-01-30 NOTE — Telephone Encounter (Signed)
-----   Message from Satira Sark, MD sent at 01/30/2019  2:16 PM EDT ----- Results reviewed.  No hemodynamically significant ICA stenoses.  Overall reassuring.

## 2019-01-30 NOTE — Telephone Encounter (Signed)
Patient's wife informed

## 2019-03-11 ENCOUNTER — Other Ambulatory Visit: Payer: Self-pay

## 2019-03-11 ENCOUNTER — Inpatient Hospital Stay: Payer: Medicare Other | Attending: Oncology

## 2019-03-11 DIAGNOSIS — Z452 Encounter for adjustment and management of vascular access device: Secondary | ICD-10-CM | POA: Diagnosis not present

## 2019-03-11 DIAGNOSIS — C250 Malignant neoplasm of head of pancreas: Secondary | ICD-10-CM

## 2019-03-11 DIAGNOSIS — Z8507 Personal history of malignant neoplasm of pancreas: Secondary | ICD-10-CM | POA: Diagnosis not present

## 2019-03-11 DIAGNOSIS — Z95828 Presence of other vascular implants and grafts: Secondary | ICD-10-CM

## 2019-03-11 MED ORDER — SODIUM CHLORIDE 0.9% FLUSH
10.0000 mL | Freq: Once | INTRAVENOUS | Status: AC
  Administered 2019-03-11: 10 mL
  Filled 2019-03-11: qty 10

## 2019-03-11 MED ORDER — HEPARIN SOD (PORK) LOCK FLUSH 100 UNIT/ML IV SOLN
500.0000 [IU] | Freq: Once | INTRAVENOUS | Status: AC
  Administered 2019-03-11: 500 [IU]
  Filled 2019-03-11: qty 5

## 2019-03-11 NOTE — Patient Instructions (Signed)

## 2019-04-01 ENCOUNTER — Other Ambulatory Visit: Payer: Self-pay | Admitting: General Surgery

## 2019-04-05 ENCOUNTER — Other Ambulatory Visit: Payer: Self-pay

## 2019-04-05 ENCOUNTER — Ambulatory Visit (INDEPENDENT_AMBULATORY_CARE_PROVIDER_SITE_OTHER): Payer: Medicare Other | Admitting: Cardiology

## 2019-04-05 ENCOUNTER — Encounter: Payer: Self-pay | Admitting: Cardiology

## 2019-04-05 VITALS — BP 159/88 | Temp 97.5°F | Ht 70.0 in | Wt 137.0 lb

## 2019-04-05 DIAGNOSIS — Z952 Presence of prosthetic heart valve: Secondary | ICD-10-CM

## 2019-04-05 DIAGNOSIS — I255 Ischemic cardiomyopathy: Secondary | ICD-10-CM | POA: Diagnosis not present

## 2019-04-05 DIAGNOSIS — I10 Essential (primary) hypertension: Secondary | ICD-10-CM

## 2019-04-05 DIAGNOSIS — I25119 Atherosclerotic heart disease of native coronary artery with unspecified angina pectoris: Secondary | ICD-10-CM

## 2019-04-05 NOTE — Patient Instructions (Addendum)
Medication Instructions: Your physician recommends that you continue on your current medications as directed. Please refer to the Current Medication list given to you today.  STOP Cozaar  Labwork:  None  Procedures/Testing:  Your physician has requested that you have an echocardiogram in [place]. Echocardiography is a painless test that uses sound waves to create images of your heart. It provides your doctor with information about the size and shape of your heart and how well your heart's chambers and valves are working. This procedure takes approximately one hour. There are no restrictions for this procedure.   Follow-Up: November with Dr.McDowell  Any Additional Special Instructions Will Be Listed Below (If Applicable).       Thank you for choosing [facility] !         If you need a refill on your cardiac medications before your next appointment, please call your pharmacy.

## 2019-04-05 NOTE — Progress Notes (Signed)
Cardiology Office Note  Date: 04/05/2019   ID: Remington, Highbaugh [birth year], MRN [id number]  PCP:  [name], MD  Cardiologist:  Rozann Lesches, MD Electrophysiologist:  None   Chief Complaint  Patient presents with  . Cardiac follow-up    History of Present Illness: ISAC LINCKS is a 73 y.o. male last seen in January.  He is here for a routine visit.  He states that he has been doing better in terms of energy, doing some chores around the house and in the yard.  He does have to rest more frequently than he did in the past, but in general has made a lot of progress over the last 6 months.  He reports good appetite.  No active angina at this point, no palpitations or syncope.  Since the last visit he has come off of Florinef.  I looked over his blood pressure recordings from home and discussed these with his wife who prepares his medications.  She has not actually given him Cozaar on any standing basis with largely systolics under 470 and at times in the 100 110 range.  We have decided to stop the medication for now and continue to follow.  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  He has completed treatments and is in clinical remission.  Recent CA-19-9 was normal range in May.  Past Medical History:  Diagnosis Date  . Arthritis   . CAD (coronary artery disease)    a. 11/2016 in [place]: STEMI s/p DES to mid LAD, DES to diagonal, DES x 2 to proximal and mid RCA   . Essential hypertension   . Former smoker   . GERD (gastroesophageal reflux disease)   . History of stroke   . Hyperlipidemia   . Myocardial infarction ([place])   . Pancreatic cancer ([place])   . Pneumonia   . Renal mass   . S/P TAVR (transcatheter aortic valve replacement)    a. 08/2017:  Edwards Sapien 3 THV (size 26 mm, model #9600CM26A , serial # [id number])  . Severe aortic stenosis    a. 08/2017: s/p TAVR by Dr.  Angelena Form and Dr. Cyndia Bent.   . Type 2 diabetes mellitus ([place])     Past Surgical History:  Procedure Laterality Date  . APPENDECTOMY    . CARDIAC CATHETERIZATION    . EUS N/A 08/02/2017   Procedure: UPPER ENDOSCOPIC ULTRASOUND (EUS) RADIAL;  Surgeon: [name], MD;  Location: [facility];  Service: Endoscopy;  Laterality: N/A;  . EYE SURGERY Left   . HAND SURGERY Bilateral   . HERNIA REPAIR Right   . LAPAROSCOPY N/A 02/19/2018   Procedure: DIAGNOSTIC LAPAROSCOPY, ERAS PATHWAY;  Surgeon: [name], MD;  Location: [facility];  Service: General;  Laterality: N/A;  . NEPHRECTOMY Left 02/19/2018   Procedure: OPEN LEFT RADICAL NEPHRECTOMY;  Surgeon: Cleon Gustin, MD;  Location: [facility];  Service: Urology;  Laterality: Left;  . PORTACATH PLACEMENT N/A 09/15/2017   Procedure: INSERTION PORT-A-CATH;  Surgeon: [name], MD;  Location: [facility];  Service: General;  Laterality: N/A;  . RIGHT/LEFT HEART CATH AND CORONARY ANGIOGRAPHY N/A 07/05/2017   Procedure: RIGHT/LEFT HEART CATH AND CORONARY ANGIOGRAPHY;  Surgeon: [name], MD;  Location: [facility];  Service: Cardiovascular;  Laterality: N/A;  . SHOULDER ARTHROSCOPY WITH ROTATOR CUFF REPAIR Left   . TEE WITHOUT CARDIOVERSION N/A 08/22/2017  Procedure: TRANSESOPHAGEAL ECHOCARDIOGRAM (TEE);  Surgeon: Burnell Blanks, MD;  Location: [facility];  Service: Open Heart Surgery;  Laterality: N/A;  . TRANSCATHETER AORTIC VALVE REPLACEMENT, TRANSFEMORAL N/A 08/22/2017   Procedure: TRANSCATHETER AORTIC VALVE REPLACEMENT, TRANSFEMORAL;  Surgeon: Burnell Blanks, MD;  Location: [facility];  Service: Open Heart Surgery;  Laterality: N/A;  . WHIPPLE PROCEDURE N/A 02/19/2018   Procedure: WHIPPLE PROCEDURE;  Surgeon: [name], MD;  Location: [facility];  Service: General;  Laterality: N/A;    Current Outpatient Medications  Medication Sig Dispense Refill  . atorvastatin (LIPITOR) 20 MG tablet TAKE 1 TABLET BY  MOUTH ONCE DAILY. 90 tablet 0  . clopidogrel (PLAVIX) 75 MG tablet TAKE 1 TABLET BY MOUTH ONCE DAILY. 30 tablet 10  . CREON 36000 units CPEP capsule TAKE 1 CAPSULE BY MOUTH BEFORE EACH MEAL AND SNACK. TAKE UP TO 6 CAPSULES PER DAY. 180 capsule 4  . Insulin Glargine (BASAGLAR KWIKPEN) 100 UNIT/ML SOPN Inject 0.1 mLs (10 Units total) into the skin at bedtime. (Patient taking differently: Inject 8 Units into the skin at bedtime. ) 5 pen 5  . insulin lispro (HUMALOG KWIKPEN) 100 UNIT/ML KwikPen Inject 3-5 units 3 times a day with meals. (Patient taking differently: 2-4 Units. Inject 3-5 units 3 times a day with meals.) 15 mL 11  . Insulin Pen Needle (PEN NEEDLES) 31G X 6 MM MISC Use as directed to check blood sugar 4 times a day. 400 each 2  . lidocaine-prilocaine (EMLA) cream lidocaine-prilocaine 2.5 %-2.5 % topical cream     No current facility-administered medications for this visit.    Allergies:  Patient has no known allergies.   Social History: The patient  reports that he quit smoking about 2 years ago. His smoking use included cigarettes. He started smoking about 60 years ago. He has a 57.00 pack-year smoking history. He has never used smokeless tobacco. He reports that he does not drink alcohol or use drugs.   ROS:  Please see the history of present illness. Otherwise, complete review of systems is positive for hearing loss as before.  All other systems are reviewed and negative.   Physical Exam: VS:  BP (!) 159/88   Temp (!) 97.5 F (36.4 C)   Ht 5' 10"  (1.778 m)   Wt 137 lb (62.1 kg)   BMI 19.66 kg/m , BMI Body mass index is 19.66 kg/m.  Wt Readings from Last 3 Encounters:  04/05/19 137 lb (62.1 kg)  01/28/19 134 lb 14.4 oz (61.2 kg)  10/29/18 131 lb 11.2 oz (59.7 kg)    General: Elderly male, appears comfortable at rest. HEENT: Conjunctiva and lids normal, oropharynx clear. Neck: Supple, no elevated JVP or carotid bruits, no thyromegaly. Lungs: Clear to auscultation,  nonlabored breathing at rest. Cardiac: Regular rate and rhythm, no S3, 2/6 systolic murmur, no pericardial rub. Abdomen: Soft, nontender, bowel sounds present. Extremities: No pitting edema, distal pulses 2+. Skin: Warm and dry. Musculoskeletal: No kyphosis.  Loss of muscle mass evident. Neuropsychiatric: Alert and oriented x3, affect grossly appropriate.  ECG:  An ECG dated 09/27/2018 was personally reviewed today and demonstrated:  Sinus rhythm with poor R wave progression consistent with previous anterior infarct, nonspecific ST-T changes.  Recent Labwork: 08/07/2018: Magnesium 2.0 10/29/2018: ALT 34; AST 30; Hemoglobin 11.1; Platelet Count 127 01/28/2019: BUN 26; Creatinine 1.60; Potassium 4.3; Sodium 138     Component Value Date/Time   CHOL 88 10/12/2018 0957   TRIG 55.0 10/12/2018 0957   HDL 43.20  10/12/2018 0957   CHOLHDL 2 10/12/2018 0957   VLDL 11.0 10/12/2018 0957   LDLCALC 34 10/12/2018 0957    Other Studies Reviewed Today:  Echocardiogram 08/02/2018: Study Conclusions  - Left ventricle: The cavity size was normal. Wall thickness was increased in a pattern of mild LVH. Systolic function was normal. The estimated ejection fraction was = 50%. Wall motion was normal; there were no regional wall motion abnormalities. Doppler parameters are consistent with abnormal left ventricular relaxation (grade 1 diastolic dysfunction). Doppler parameters are consistent with high ventricular filling pressure. - Aortic valve: History of TAVR. There is an Edwards Sapien 3 THV 26 mm tissue valve in the AV position. There was no stenosis. Very mild perivalvualar leak. - Mitral valve: Mildly calcified annulus. Mildly thickened leaflets. - Left atrium: The atrium was mildly dilated. - Atrial septum: No defect or patent foramen ovale was identified.  Carotid Dopplers 01/30/2019: Summary: Right Carotid: Velocities in the right ICA are consistent with a 1-39% stenosis.   Left Carotid: Velocities in the left ICA are consistent with a 40-59% stenosis.               Non-hemodynamically significant plaque noted in the CCA.  Vertebrals:  Bilateral vertebral arteries demonstrate antegrade flow. Subclavians: Left subclavian artery flow was disturbed. Normal flow hemodynamics              were seen in the right subclavian artery.  Assessment and Plan:  1.  Multivessel CAD status post anterior STEMI back in 2018.  He underwent placement of DES to the LAD, DES to the diagonal, and DES x2 to the RCA at that point at outside facility.  He remains angina free at this time and we will plan to continue both Plavix and statin therapy.  2.  Severe aortic stenosis status post TAVR in December 2018.  Echocardiogram from November 2019 showed stable gradient with mild perivalvular leak.  We will obtain a follow-up echocardiogram this November.  3.  Essential hypertension.  Orthostasis has improved and he is now off Florinef.  We are going to keep him off Cozaar for the time being and monitor blood pressure.  This can always be resumed low-dose depending on trend.  4.  History of cardiomyopathy, last assessment of LVEF was approximately 50%.  No changes in current regimen.  Medication Adjustments/Labs and Tests Ordered: Current medicines are reviewed at length with the patient today.  Concerns regarding medicines are outlined above.   Tests Ordered: Orders Placed This Encounter  Procedures  . ECHOCARDIOGRAM COMPLETE    Medication Changes: No orders of the defined types were placed in this encounter.   Disposition:  Follow up November in the [place] office after echocardiogram.  Signed, Satira Sark, MD, [facility] 04/05/2019 9:08 AM    [facility]. [address] [place], [place] [address] Phone: [phone]; Fax: [phone]

## 2019-04-09 ENCOUNTER — Other Ambulatory Visit: Payer: Self-pay | Admitting: Oncology

## 2019-04-16 ENCOUNTER — Other Ambulatory Visit: Payer: Self-pay | Admitting: General Surgery

## 2019-04-25 ENCOUNTER — Encounter: Payer: Self-pay | Admitting: Nutrition

## 2019-04-25 NOTE — Progress Notes (Signed)
Patient's wife called requesting information on financial help with patient's creon. The copay has increased to $224 per month. Contacted [employer] who will work on assistance and call wife with update.

## 2019-04-29 ENCOUNTER — Telehealth: Payer: Self-pay

## 2019-04-29 NOTE — Telephone Encounter (Signed)
Called Mrs. Atzin to provide [employer] PAP number.  She reports that the co- pay for 90 tablets is $400 . She will request a copy of the form from [place] and bring in their part to apt on 8/18. I have a page for Dr. Benay Spice to complete. I'll fax completed packet to [employer].

## 2019-04-30 ENCOUNTER — Inpatient Hospital Stay (HOSPITAL_BASED_OUTPATIENT_CLINIC_OR_DEPARTMENT_OTHER): Payer: Medicare Other | Admitting: Oncology

## 2019-04-30 ENCOUNTER — Inpatient Hospital Stay: Payer: Medicare Other

## 2019-04-30 ENCOUNTER — Other Ambulatory Visit: Payer: Self-pay

## 2019-04-30 ENCOUNTER — Inpatient Hospital Stay: Payer: Medicare Other | Attending: Oncology

## 2019-04-30 VITALS — BP 148/77 | HR 73 | Temp 99.1°F | Resp 17 | Ht 70.0 in | Wt 135.7 lb

## 2019-04-30 DIAGNOSIS — Z9221 Personal history of antineoplastic chemotherapy: Secondary | ICD-10-CM | POA: Insufficient documentation

## 2019-04-30 DIAGNOSIS — E119 Type 2 diabetes mellitus without complications: Secondary | ICD-10-CM | POA: Insufficient documentation

## 2019-04-30 DIAGNOSIS — I10 Essential (primary) hypertension: Secondary | ICD-10-CM | POA: Diagnosis not present

## 2019-04-30 DIAGNOSIS — I252 Old myocardial infarction: Secondary | ICD-10-CM | POA: Insufficient documentation

## 2019-04-30 DIAGNOSIS — D696 Thrombocytopenia, unspecified: Secondary | ICD-10-CM | POA: Diagnosis not present

## 2019-04-30 DIAGNOSIS — Z87891 Personal history of nicotine dependence: Secondary | ICD-10-CM | POA: Diagnosis not present

## 2019-04-30 DIAGNOSIS — Z8507 Personal history of malignant neoplasm of pancreas: Secondary | ICD-10-CM | POA: Insufficient documentation

## 2019-04-30 DIAGNOSIS — C250 Malignant neoplasm of head of pancreas: Secondary | ICD-10-CM

## 2019-04-30 DIAGNOSIS — I255 Ischemic cardiomyopathy: Secondary | ICD-10-CM

## 2019-04-30 DIAGNOSIS — Z95828 Presence of other vascular implants and grafts: Secondary | ICD-10-CM

## 2019-04-30 DIAGNOSIS — Z452 Encounter for adjustment and management of vascular access device: Secondary | ICD-10-CM | POA: Diagnosis not present

## 2019-04-30 DIAGNOSIS — Z905 Acquired absence of kidney: Secondary | ICD-10-CM | POA: Diagnosis not present

## 2019-04-30 LAB — CBC WITH DIFFERENTIAL (CANCER CENTER ONLY)
Abs Immature Granulocytes: 0.01 10*3/uL (ref 0.00–0.07)
Basophils Absolute: 0 10*3/uL (ref 0.0–0.1)
Basophils Relative: 0 %
Eosinophils Absolute: 0.1 10*3/uL (ref 0.0–0.5)
Eosinophils Relative: 2 %
HCT: 34.1 % — ABNORMAL LOW (ref 39.0–52.0)
Hemoglobin: 10.8 g/dL — ABNORMAL LOW (ref 13.0–17.0)
Immature Granulocytes: 0 %
Lymphocytes Relative: 18 %
Lymphs Abs: 1.1 10*3/uL (ref 0.7–4.0)
MCH: 31.1 pg (ref 26.0–34.0)
MCHC: 31.7 g/dL (ref 30.0–36.0)
MCV: 98.3 fL (ref 80.0–100.0)
Monocytes Absolute: 0.7 10*3/uL (ref 0.1–1.0)
Monocytes Relative: 12 %
Neutro Abs: 3.9 10*3/uL (ref 1.7–7.7)
Neutrophils Relative %: 68 %
Platelet Count: 108 10*3/uL — ABNORMAL LOW (ref 150–400)
RBC: 3.47 MIL/uL — ABNORMAL LOW (ref 4.22–5.81)
RDW: 13.8 % (ref 11.5–15.5)
WBC Count: 5.9 10*3/uL (ref 4.0–10.5)
nRBC: 0 % (ref 0.0–0.2)

## 2019-04-30 LAB — CMP (CANCER CENTER ONLY)
ALT: 37 U/L (ref 0–44)
AST: 33 U/L (ref 15–41)
Albumin: 3.6 g/dL (ref 3.5–5.0)
Alkaline Phosphatase: 114 U/L (ref 38–126)
Anion gap: 8 (ref 5–15)
BUN: 23 mg/dL (ref 8–23)
CO2: 21 mmol/L — ABNORMAL LOW (ref 22–32)
Calcium: 8.5 mg/dL — ABNORMAL LOW (ref 8.9–10.3)
Chloride: 108 mmol/L (ref 98–111)
Creatinine: 1.5 mg/dL — ABNORMAL HIGH (ref 0.61–1.24)
GFR, Est AFR Am: 52 mL/min — ABNORMAL LOW (ref >60)
GFR, Estimated: 45 mL/min — ABNORMAL LOW (ref >60)
Glucose, Bld: 226 mg/dL — ABNORMAL HIGH (ref 70–99)
Potassium: 4.2 mmol/L (ref 3.5–5.1)
Sodium: 137 mmol/L (ref 135–145)
Total Bilirubin: 0.4 mg/dL (ref 0.3–1.2)
Total Protein: 6.1 g/dL — ABNORMAL LOW (ref 6.5–8.1)

## 2019-04-30 MED ORDER — SODIUM CHLORIDE 0.9% FLUSH
10.0000 mL | Freq: Once | INTRAVENOUS | Status: AC
  Administered 2019-04-30: 10 mL
  Filled 2019-04-30: qty 10

## 2019-04-30 MED ORDER — HEPARIN SOD (PORK) LOCK FLUSH 100 UNIT/ML IV SOLN
500.0000 [IU] | Freq: Once | INTRAVENOUS | Status: AC
  Administered 2019-04-30: 500 [IU]
  Filled 2019-04-30: qty 5

## 2019-04-30 NOTE — Progress Notes (Signed)
[facility] OFFICE PROGRESS NOTE   Diagnosis: Pancreas cancer  INTERVAL HISTORY:   [facility] returns as scheduled.  He feels well.  Good appetite.  No syncope events.  No diarrhea.  No reflux symptoms since undergoing increased surgery.  Objective:  Vital signs in last 24 hours:  Blood pressure (!) 148/77, pulse 73, temperature 99.1 F (37.3 C), temperature source Oral, resp. rate 17, height 5' 10"  (1.778 m), weight 135 lb 11.2 oz (61.6 kg), SpO2 100 %.   Limited physical examination secondary to distancing with the COVID pandemic HEENT: Neck without mass Lymphatics: No cervical, supraclavicular, axillary, or inguinal nodes GI: No hepatosplenomegaly, no mass, nontender    Portacath/PICC-without erythema  Lab Results:  Lab Results  Component Value Date   WBC 5.9 04/30/2019   HGB 10.8 (L) 04/30/2019   HCT 34.1 (L) 04/30/2019   MCV 98.3 04/30/2019   PLT 108 (L) 04/30/2019   NEUTROABS 3.9 04/30/2019    CMP  Lab Results  Component Value Date   NA 137 04/30/2019   K 4.2 04/30/2019   CL 108 04/30/2019   CO2 21 (L) 04/30/2019   GLUCOSE 226 (H) 04/30/2019   BUN 23 04/30/2019   CREATININE 1.50 (H) 04/30/2019   CALCIUM 8.5 (L) 04/30/2019   PROT 6.1 (L) 04/30/2019   ALBUMIN 3.6 04/30/2019   AST 33 04/30/2019   ALT 37 04/30/2019   ALKPHOS 114 04/30/2019   BILITOT 0.4 04/30/2019   GFRNONAA 45 (L) 04/30/2019   GFRAA 52 (L) 04/30/2019     Medications: I have reviewed the patient's current medications.   Assessment/Plan:  1. Adenocarcinoma of the pancreas head/neck, status post an EUS biopsy 08/02/2017 confirming adenocarcinoma ? EUS 08/02/2017-pancreas head/neck mass, abutment of the portal vein, no peripancreatic adenopathy ? MRI abdomen 07/27/2017-head/neck pancreas mass with mass-effect on the portal splenic confluence, no involvement of the celiac axis or superior mesenteric artery, no evidence of metastatic disease ? PET scan 08/31/2017-very  hypermetabolism at the pancreatic head mass, no evidence of metastatic disease. The isolated right lower lobe nodule below PET resolution, left sided renal mass with decreased activity relative to the normal adjacent kidney ? Cycle 1 FOLFIRINOX 09/16/2017 ? Cycle 2 FOLFOX 10/16/2017 ? Cycle 3 FOLFOX 10/30/2017 ? Cycle 4 FOLFIRINOX 11/14/2017 (irinotecan resumed with a dose reduction) ? Cycle 5 FOLFIRINOX3/20/2019 (Oxaliplatin dose reduced due to thrombocytopenia) ? Cycle 6 FOLFIRINOX4/11/2017 ? CTs 12/25/2017-stable right lower lobe nodule, decrease in size of the pancreas head mass, stable left kidney mass ? Pancreaticoduodenectomy and left nephrectomy 02/19/2018,[id number], 2/18 lymph nodes positive, lymphovascular and perineural invasion present, mild to moderate treatment effect, tumor involved adherent portal vein with negative portal vein margins ? Cycle 1 adjuvant gemcitabine/Abraxane 04/25/2018 ? Cycle 2 adjuvant gemcitabine/Abraxane 05/16/2018 ? Cycle 3 adjuvant gemcitabine/Abraxane 05/31/2018 ? Cycle 4 adjuvant gemcitabine/Abraxane 06/13/2018 ? Cycle 5 adjuvant gemcitabine/Abraxane 06/27/2018 ? Cycle 6 adjuvant gemcitabine/Abraxane 07/11/2018  2. 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 Barry Dienes  9. Diarrhea-potentially related to pancreas insufficiency, he began a trial of pancreatic enzyme replacement 05/16/2018: Diarrhea resolved  10. 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  Renal insufficiency  13.  Mild thrombocytopenia    Disposition: [name] is in clinical remission from pancreas cancer.  We will follow-up on the CA 19-9 from today.  He will return for an office and lab visit in 4 months.  Dr. Barry Dienes will remove the Port-A-Cath if the CA 19-9 returns normal today.  [name], MD  04/30/2019  12:12 PM

## 2019-05-01 ENCOUNTER — Telehealth: Payer: Self-pay | Admitting: Oncology

## 2019-05-01 LAB — CANCER ANTIGEN 19-9: CA 19-9: 7 U/mL (ref 0–35)

## 2019-05-01 NOTE — Telephone Encounter (Signed)
Called and left msg.mailed printout  °

## 2019-05-02 ENCOUNTER — Other Ambulatory Visit: Payer: Self-pay | Admitting: General Surgery

## 2019-05-10 ENCOUNTER — Other Ambulatory Visit: Payer: Self-pay

## 2019-05-14 ENCOUNTER — Other Ambulatory Visit: Payer: Self-pay

## 2019-05-14 ENCOUNTER — Encounter: Payer: Self-pay | Admitting: Internal Medicine

## 2019-05-14 ENCOUNTER — Ambulatory Visit (INDEPENDENT_AMBULATORY_CARE_PROVIDER_SITE_OTHER): Payer: Medicare Other | Admitting: Internal Medicine

## 2019-05-14 VITALS — BP 140/80 | HR 79 | Ht 70.0 in | Wt 136.0 lb

## 2019-05-14 DIAGNOSIS — E1159 Type 2 diabetes mellitus with other circulatory complications: Secondary | ICD-10-CM

## 2019-05-14 DIAGNOSIS — E782 Mixed hyperlipidemia: Secondary | ICD-10-CM

## 2019-05-14 DIAGNOSIS — E1165 Type 2 diabetes mellitus with hyperglycemia: Secondary | ICD-10-CM

## 2019-05-14 DIAGNOSIS — I255 Ischemic cardiomyopathy: Secondary | ICD-10-CM | POA: Diagnosis not present

## 2019-05-14 LAB — POCT GLYCOSYLATED HEMOGLOBIN (HGB A1C): Hemoglobin A1C: 7.3 % — AB (ref 4.0–5.6)

## 2019-05-14 NOTE — Addendum Note (Signed)
Addended by: Cardell Peach I on: 05/14/2019 12:45 PM   Modules accepted: Orders

## 2019-05-14 NOTE — Progress Notes (Signed)
Patient ID: Manuel Olson, male   DOB: 1945-11-10, 73 y.o.   MRN: [id number]   HPI: Manuel Olson is a 73 y.o.-year-old male, initially referred by his oncologist, Dr. Benay Spice, presenting for follow-up for DM2, dx in 01/2016, insulin-dependent, uncontrolled, with complications (CAD - s/p AMI 11/2016 - s/p 4x DES; Ao stenosis). He saw Dr. Dorris Fetch before.  Last visit 4 months ago -virtual.  Patient has a significant history of pancreatic cancer diagnosed 07/2017.  He started chemotherapy 09/2017.  Since then, he also had a kidney removed for renal cell carcinoma and at the same time he had pancreatic resection (head of the pancreas) 02/2018.  He was found to have metastases in his lymph nodes.  He restarted chemotherapy after the surgery but finished 06/2018.  His insulin production after his pancreatic resection was low: Component     Latest Ref Rng & Units 10/12/2018  C-Peptide     0.80 - 3.85 ng/mL 0.53 (L)  Glucose, Plasma     65 - 99 mg/dL 168 (H)   Last hemoglobin A1c was: Lab Results  Component Value Date   HGBA1C 7.6 (A) 10/12/2018   HGBA1C 7.1 (A) 06/25/2018   HGBA1C 6.4 (H) 02/09/2018   Pt was on a regimen of: - Glipizide 5 mg daily  - Tresiba 20 units at bedtime (samples, cannot afford this)    He is currently on:  - Basaglar 8-10 >> 9 >> 10 >> 8-9 units at bedtime - Humalog 2-4 >> 3-5 units before the 3 meals, even if the sugars before the meals are normal. If sugar before meals <70, do not take Humalog with that meals.  Pt was checking his sugar 4 times a day with his freestyle libre CGM and transcribes the CBGs in his log, which we reviewed today: - am:  52, 55, 59, 62-120, 135 >> 89-135, 166 >> 86-139, 166 >>95-138, 150, 191 - 2h after b'fast: n/c >> 137 >. n/c - before lunch: 140-301 >> 95-151, 164 >> n/c >> 123-208, 234 >> 108-212, 287 - 2h after lunch: n/c - before dinner: 140 >> 152-260 >> 100-166, 185, 215 >> 67, 75-266, 292 - 2h after dinner: n/c -  bedtime:  143, 159-255, 332 >> 104-262 >> 164-235, 337 >> 124-247, 268 - nighttime: n/c Lowest sugar was  42 (libre) >> 86 >> 67; it is unclear at which level he has hypoglycemia awareness. Highest sugar was 337 >> 292.  Glucometer: True Metrix  Pt's meals are: - Breakfast: bacon + eggs + English muffin + occas. Tomato juice   - Lunch: grilled ham and cheese sandwich + potato chips - Dinner: chicken + veggies + starch - Snacks: fruit cup, pudding, fig newtons, icecreams  + CKD, last BUN/creatinine:  Lab Results  Component Value Date   BUN 23 04/30/2019   BUN 26 (H) 01/28/2019   CREATININE 1.50 (H) 04/30/2019   CREATININE 1.60 (H) 01/28/2019  On Cozaar. + HL; last set of lipids: Lab Results  Component Value Date   CHOL 88 10/12/2018   HDL 43.20 10/12/2018   LDLCALC 34 10/12/2018   TRIG 55.0 10/12/2018   CHOLHDL 2 10/12/2018  On Lipitor. - last eye exam was in 07/2018: No DR. 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  He has a history of laser surgeries. -No numbness but has tingling in his feet  Pt has FH of DM in brother.  He also has a history of HTN.  ROS: Constitutional: no weight gain/no weight loss,  no fatigue, no subjective hyperthermia, no subjective hypothermia Eyes: no blurry vision, no xerophthalmia ENT: no sore throat, no nodules palpated in neck, no dysphagia, no odynophagia, no hoarseness Cardiovascular: no CP/no SOB/no palpitations/no leg swelling Respiratory: no cough/no SOB/no wheezing Gastrointestinal: no N/no V/no D/no C/no acid reflux Musculoskeletal: no muscle aches/no joint aches Skin: no rashes, no hair loss Neurological: no tremors/no numbness/no tingling/no dizziness  I reviewed pt's medications, allergies, PMH, social hx, family hx, and changes were documented in the history of present illness. Otherwise, unchanged from my initial visit note.  Past Medical History:  Diagnosis Date  . Arthritis   . CAD (coronary artery disease)    a. 11/2016 in [place]: STEMI  s/p DES to mid LAD, DES to diagonal, DES x 2 to proximal and mid RCA   . Essential hypertension   . Former smoker   . GERD (gastroesophageal reflux disease)   . History of stroke   . Hyperlipidemia   . Myocardial infarction ([place])   . Pancreatic cancer ([place])   . Pneumonia   . Renal mass   . S/P TAVR (transcatheter aortic valve replacement)    a. 08/2017:  Edwards Sapien 3 THV (size 26 mm, model #9600CM26A , serial # [id number])  . Severe aortic stenosis    a. 08/2017: s/p TAVR by Dr. Angelena Form and Dr. Cyndia Bent.   . Type 2 diabetes mellitus ([place])    Past Surgical History:  Procedure Laterality Date  . APPENDECTOMY    . CARDIAC CATHETERIZATION    . EUS N/A 08/02/2017   Procedure: UPPER ENDOSCOPIC ULTRASOUND (EUS) RADIAL;  Surgeon: [name], MD;  Location: [facility];  Service: Endoscopy;  Laterality: N/A;  . EYE SURGERY Left   . HAND SURGERY Bilateral   . HERNIA REPAIR Right   . LAPAROSCOPY N/A 02/19/2018   Procedure: DIAGNOSTIC LAPAROSCOPY, ERAS PATHWAY;  Surgeon: [name], MD;  Location: [facility];  Service: General;  Laterality: N/A;  . NEPHRECTOMY Left 02/19/2018   Procedure: OPEN LEFT RADICAL NEPHRECTOMY;  Surgeon: Cleon Gustin, MD;  Location: [facility];  Service: Urology;  Laterality: Left;  . PORTACATH PLACEMENT N/A 09/15/2017   Procedure: INSERTION PORT-A-CATH;  Surgeon: [name], MD;  Location: [facility];  Service: General;  Laterality: N/A;  . RIGHT/LEFT HEART CATH AND CORONARY ANGIOGRAPHY N/A 07/05/2017   Procedure: RIGHT/LEFT HEART CATH AND CORONARY ANGIOGRAPHY;  Surgeon: [name], MD;  Location: [facility];  Service: Cardiovascular;  Laterality: N/A;  . SHOULDER ARTHROSCOPY WITH ROTATOR CUFF REPAIR Left   . TEE WITHOUT CARDIOVERSION N/A 08/22/2017   Procedure: TRANSESOPHAGEAL ECHOCARDIOGRAM (TEE);  Surgeon: Burnell Blanks, MD;  Location: [facility];  Service: Open Heart Surgery;  Laterality: N/A;  . TRANSCATHETER AORTIC VALVE  REPLACEMENT, TRANSFEMORAL N/A 08/22/2017   Procedure: TRANSCATHETER AORTIC VALVE REPLACEMENT, TRANSFEMORAL;  Surgeon: Burnell Blanks, MD;  Location: [facility];  Service: Open Heart Surgery;  Laterality: N/A;  . WHIPPLE PROCEDURE N/A 02/19/2018   Procedure: WHIPPLE PROCEDURE;  Surgeon: [name], MD;  Location: [facility];  Service: General;  Laterality: N/A;   Social History   Socioeconomic History  . Marital status: Married    Spouse name: Not on file  . Number of children: Not on file  Social Needs  Occupational History  .  Retired  Tobacco Use  . Smoking status: Former Smoker    Packs/day: 1.00    Years: 57.00    Pack years: 57.00    Types: Cigarettes  Start date: 09/12/1958    Last attempt to quit: 2018    Years since quitting: 1.5  . Smokeless tobacco: Never Used  Substance and Sexual Activity  . Alcohol use: No  . Drug use: No   Current Outpatient Medications on File Prior to Visit  Medication Sig Dispense Refill  . atorvastatin (LIPITOR) 20 MG tablet TAKE 1 TABLET BY MOUTH ONCE DAILY. 90 tablet 0  . clopidogrel (PLAVIX) 75 MG tablet TAKE 1 TABLET BY MOUTH ONCE DAILY. 30 tablet 10  . CREON 36000 units CPEP capsule TAKE 1 CAPSULE BY MOUTH BEFORE EACH MEAL AND SNACK. TAKE UP TO 6 CAPSULES PER DAY. 90 capsule 0  . Insulin Glargine (BASAGLAR KWIKPEN) 100 UNIT/ML SOPN Inject 0.1 mLs (10 Units total) into the skin at bedtime. (Patient taking differently: Inject 8 Units into the skin at bedtime. ) 5 pen 5  . insulin lispro (HUMALOG KWIKPEN) 100 UNIT/ML KwikPen Inject 3-5 units 3 times a day with meals. (Patient taking differently: 2-4 Units. Inject 3-5 units 3 times a day with meals.) 15 mL 11  . Insulin Pen Needle (PEN NEEDLES) 31G X 6 MM MISC Use as directed to check blood sugar 4 times a day. 400 each 2  . lidocaine-prilocaine (EMLA) cream lidocaine-prilocaine 2.5 %-2.5 % topical cream     No current facility-administered medications on file prior to visit.    No Known  Allergies Family History  Problem Relation Age of Onset  . Lupus Mother   . CAD Brother   . Hypertension Brother     PE: BP 140/80   Pulse 79   Ht 5' 10"  (1.778 m)   Wt 136 lb (61.7 kg)   SpO2 97%   BMI 19.51 kg/m  Wt Readings from Last 3 Encounters:  05/14/19 136 lb (61.7 kg)  04/30/19 135 lb 11.2 oz (61.6 kg)  04/05/19 137 lb (62.1 kg)   Constitutional: Thin, in NAD Eyes: PERRLA, EOMI, no exophthalmos ENT: moist mucous membranes, no thyromegaly, no cervical lymphadenopathy Cardiovascular: RRR, No MRG Respiratory: CTA B Gastrointestinal: abdomen soft, NT, ND, BS+ Musculoskeletal: no deformities, strength intact in all 4 Skin: moist, warm, no rashes Neurological: no tremor with outstretched hands, DTR normal in all 4  ASSESSMENT: 1. DM2, non-insulin-dependent, uncontrolled, with complications - CAD - s/p AMI 11/2016 - s/p DES x4 -  Ao stenosis  2. HL  3.  Protein caloric malnutrition  PLAN:  1. Patient with history of initially controlled diabetes with deteriorating control around the time of his pancreatic cancer diagnosis in 2018.  After his diagnosis and pancreatectomy, we started insulin.  His C-peptide was low so we will need to continue insulin.  As he has protein caloric malnutrition and trying to gain weight, we did not start any medications that have weight loss as a side effect.  He continues on basal-bolus insulin regimen.  He has a CGM which we downloaded today and we will scan the reports. -At last visit, sugars were close to goal in the morning and they were increasing before lunch and dinner so we increased his Humalog before meals.  We also increased his Basaglar dose by 1 unit since all his sugars were slightly above target.  Of note, he is very insulin sensitive. -At this visit, he tells me that he is dropping his sugars a little too much from night 2 AM.  He tried to decrease the Basaglar dose to 8 or 9 units, but occasionally he still have a slightly  lower  blood sugar in the morning.  I advised him to keep the dose at 9 units but move the dose in the a.m. -During the day, he has hyperglycemic spikes particularly after starches (toast, rice) but also after food, which allows.  We discussed about using a higher dose of NovoLog before these foods. - I suggested to:  Patient Instructions  Please continue: - Basaglar 9 units but move it to am  Increase: - Humalog 5-8 units before the 3 meals, even if the sugars before the meals are normal.  If sugars before meal are lower than 70, do not take Humalog with that meal.  Please return in 4 months with your sugar log.   - we checked his HbA1c: 7.3% (lower) - advised to check sugars at different times of the day - 4x a day, rotating check times - advised for yearly eye exams >> he is UTD - return to clinic in 4 months    2. HL - Reviewed latest lipid panel from the beginning of the year: Excellent Lab Results  Component Value Date   CHOL 88 10/12/2018   HDL 43.20 10/12/2018   LDLCALC 34 10/12/2018   TRIG 55.0 10/12/2018   CHOLHDL 2 10/12/2018  - Continues  Lipitor without side effects.  3.  Protein caloric malnutrition -He continues to try to gain weight.  Weight is stable in the last 3 months. -He is on insulin, which has an anabolic effect -Reviewed latest total protein level and this was low on the last CMP  Philemon Kingdom, MD PhD [facility]

## 2019-05-14 NOTE — Patient Instructions (Addendum)
Please continue: - Basaglar 9 units but move it to am  Increase: - Humalog 5-8 units before the 3 meals, even if the sugars before the meals are normal.  If sugars before meal are lower than 70, do not take Humalog with that meal.  Please return in 4 months with your sugar log.

## 2019-05-31 DIAGNOSIS — C250 Malignant neoplasm of head of pancreas: Secondary | ICD-10-CM | POA: Diagnosis not present

## 2019-05-31 DIAGNOSIS — Z95828 Presence of other vascular implants and grafts: Secondary | ICD-10-CM | POA: Diagnosis not present

## 2019-06-04 ENCOUNTER — Telehealth: Payer: Self-pay | Admitting: Cardiology

## 2019-06-04 NOTE — Telephone Encounter (Signed)
Satira Sark, MD  [name], MD        Yes, you can hold Plavix as requested.   Previous Messages  ----- Message -----  From: [name], MD  Sent: 05/31/2019 11:10 AM EDT  To: Satira Sark, MD   Can I hold plavix for 5 days for port removal? It is just under mild sedation.   Thanks  [name]  Surgical Oncology

## 2019-06-04 NOTE — Telephone Encounter (Signed)
Dr. Barry Dienes is needing to know how long the pt needs to hold his clopidogrel (PLAVIX) 75 MG tablet [[id number]]   His surgery is scheduled for 06/13/2019   Please call April @ [facility]  [phone]

## 2019-06-04 NOTE — Telephone Encounter (Signed)
Patient having a port a cath removed

## 2019-06-10 ENCOUNTER — Other Ambulatory Visit (HOSPITAL_COMMUNITY)
Admission: RE | Admit: 2019-06-10 | Discharge: 2019-06-10 | Disposition: A | Discharge status: Home / Self Care | Payer: Medicare Other | Source: Ambulatory Visit | Attending: General Surgery | Admitting: General Surgery

## 2019-06-10 DIAGNOSIS — Z01812 Encounter for preprocedural laboratory examination: Secondary | ICD-10-CM | POA: Insufficient documentation

## 2019-06-10 DIAGNOSIS — Z20828 Contact with and (suspected) exposure to other viral communicable diseases: Secondary | ICD-10-CM | POA: Diagnosis not present

## 2019-06-11 ENCOUNTER — Telehealth: Payer: Self-pay

## 2019-06-11 LAB — NOVEL CORONAVIRUS, NAA (HOSP ORDER, SEND-OUT TO REF LAB; TAT 18-24 HRS): SARS-CoV-2, NAA: NOT DETECTED

## 2019-06-11 NOTE — Telephone Encounter (Signed)
Chart notes for CGM faxed to [facility].

## 2019-06-12 ENCOUNTER — Other Ambulatory Visit: Payer: Self-pay

## 2019-06-12 ENCOUNTER — Encounter (HOSPITAL_COMMUNITY): Payer: Self-pay | Admitting: *Deleted

## 2019-06-12 NOTE — Progress Notes (Signed)
SDW-pre-op call completed by pt spouse, [name]. Spouse denies that pt C/O SOB and chest pain. Spouse stated that pt  is under the care of Dr. Domenic Polite, Cardiology and Dr. Dimas Chyle, PCP. Spouse denies that pt had a stress test. Spouse denies recent labs. Spouse stated that pt last dose of Plavix was " Friday" as instructed. Spouse made aware to have pt stop taking  vitamins, fish oil and herbal medications. Do not take any NSAIDs ie: Ibuprofen, Advil, Naproxen (Aleve), Motrin, BC and Goody Powder. Spouse stated that surgeon instructed pt to not eat or drink after 5:30 AM and to take 4.5 units of Basaglar insulin if needed the morning of surgery. Spouse made aware to have pt check CBG every 2 hours prior to arrival to hospital on DOS. Pt made aware to treat a CBG < 70 with 4 ounces of apple  juice, wait 15 minutes after intervention to recheck BG, if BG remains < 70, call Short Stay unit to speak with a nurse. Spouse verbalized understanding of all pre-op instructions. PA, Anesthesiology, asked to review pt history; see note.

## 2019-06-12 NOTE — Anesthesia Preprocedure Evaluation (Addendum)
Anesthesia Evaluation  Patient identified by MRN, date of birth, ID band Patient awake    Reviewed: Allergy & Precautions, H&P , NPO status , Patient's Chart, lab work & pertinent test results, reviewed documented beta blocker date and time   Airway Mallampati: I  TM Distance: >3 FB Neck ROM: full    Dental no notable dental hx. (+) Edentulous Upper, Edentulous Lower, Upper Dentures, Lower Dentures   Pulmonary neg pulmonary ROS, former smoker,    Pulmonary exam normal breath sounds clear to auscultation       Cardiovascular Exercise Tolerance: Good hypertension, + CAD and + Past MI  + Valvular Problems/Murmurs  Rhythm:regular Rate:Normal     Neuro/Psych negative neurological ROS  negative psych ROS   GI/Hepatic Neg liver ROS, GERD  ,  Endo/Other  negative endocrine ROSdiabetes  Renal/GU Renal disease  negative genitourinary   Musculoskeletal   Abdominal   Peds  Hematology negative hematology ROS (+)   Anesthesia Other Findings   Reproductive/Obstetrics negative OB ROS                           Anesthesia Physical Anesthesia Plan  ASA: III  Anesthesia Plan: MAC   Post-op Pain Management:    Induction: Intravenous  PONV Risk Score and Plan: 1  Airway Management Planned: Mask, Natural Airway and Nasal Cannula  Additional Equipment:   Intra-op Plan:   Post-operative Plan:   Informed Consent: I have reviewed the patients History and Physical, chart, labs and discussed the procedure including the risks, benefits and alternatives for the proposed anesthesia with the patient or authorized representative who has indicated his/her understanding and acceptance.     Dental Advisory Given  Plan Discussed with: CRNA, Anesthesiologist and Surgeon  Anesthesia Plan Comments: (Follows with cardiology for hx of CAD (STEMI 2018 treated with DES to the LAD, DES to the diagonal, and DES x2 to  the RCA), AS s/p TAVR in December 2018.  Echocardiogram from November 2019 showed stable gradient with mild perivalvular leak, cardiomyopathy (EF 50%).  Last seen by Dr. Domenic Polite 04/05/19, doing well at that time. Cleared to stop Plavix 5d preop per telephone encounter 06/04/19.  TTE 08/02/18: - Left ventricle: The cavity size was normal. Wall thickness was   increased in a pattern of mild LVH. Systolic function was normal.   The estimated ejection fraction was = 50%. Wall motion was   normal; there were no regional wall motion abnormalities. Doppler   parameters are consistent with abnormal left ventricular   relaxation (grade 1 diastolic dysfunction). Doppler parameters   are consistent with high ventricular filling pressure. - Aortic valve: History of TAVR. There is an Edwards Sapien 3 THV   26 mm tissue valve in the AV position. There was no stenosis.   Very mild perivalvualar leak. - Mitral valve: Mildly calcified annulus. Mildly thickened leaflets   . - Left atrium: The atrium was mildly dilated. - Atrial septum: No defect or patent foramen ovale was identified.)      Anesthesia Quick Evaluation

## 2019-06-13 ENCOUNTER — Ambulatory Visit (HOSPITAL_COMMUNITY): Payer: Medicare Other | Admitting: Physician Assistant

## 2019-06-13 ENCOUNTER — Encounter (HOSPITAL_COMMUNITY)
Admission: RE | Disposition: A | Discharge status: Home / Self Care | Payer: Self-pay | Source: Home / Self Care | Attending: General Surgery

## 2019-06-13 ENCOUNTER — Ambulatory Visit (HOSPITAL_COMMUNITY)
Admission: RE | Admit: 2019-06-13 | Discharge: 2019-06-13 | Disposition: A | Discharge status: Home / Self Care | Payer: Medicare Other | Attending: General Surgery | Admitting: General Surgery

## 2019-06-13 ENCOUNTER — Encounter (HOSPITAL_COMMUNITY): Payer: Self-pay | Admitting: General Practice

## 2019-06-13 DIAGNOSIS — Z452 Encounter for adjustment and management of vascular access device: Secondary | ICD-10-CM | POA: Insufficient documentation

## 2019-06-13 DIAGNOSIS — I2510 Atherosclerotic heart disease of native coronary artery without angina pectoris: Secondary | ICD-10-CM | POA: Insufficient documentation

## 2019-06-13 DIAGNOSIS — Z8507 Personal history of malignant neoplasm of pancreas: Secondary | ICD-10-CM | POA: Diagnosis not present

## 2019-06-13 DIAGNOSIS — E119 Type 2 diabetes mellitus without complications: Secondary | ICD-10-CM | POA: Insufficient documentation

## 2019-06-13 DIAGNOSIS — Z79899 Other long term (current) drug therapy: Secondary | ICD-10-CM | POA: Insufficient documentation

## 2019-06-13 DIAGNOSIS — I252 Old myocardial infarction: Secondary | ICD-10-CM | POA: Insufficient documentation

## 2019-06-13 DIAGNOSIS — K219 Gastro-esophageal reflux disease without esophagitis: Secondary | ICD-10-CM | POA: Insufficient documentation

## 2019-06-13 DIAGNOSIS — Z794 Long term (current) use of insulin: Secondary | ICD-10-CM | POA: Insufficient documentation

## 2019-06-13 DIAGNOSIS — I10 Essential (primary) hypertension: Secondary | ICD-10-CM | POA: Insufficient documentation

## 2019-06-13 DIAGNOSIS — Z87891 Personal history of nicotine dependence: Secondary | ICD-10-CM | POA: Diagnosis not present

## 2019-06-13 HISTORY — PX: PORT-A-CATH REMOVAL: SHX5289

## 2019-06-13 HISTORY — DX: Presence of external hearing-aid: Z97.4

## 2019-06-13 HISTORY — DX: Presence of spectacles and contact lenses: Z97.3

## 2019-06-13 HISTORY — DX: Complete loss of teeth, unspecified cause, unspecified class: K08.109

## 2019-06-13 LAB — GLUCOSE, CAPILLARY
Glucose-Capillary: 180 mg/dL — ABNORMAL HIGH (ref 70–99)
Glucose-Capillary: 154 mg/dL — ABNORMAL HIGH (ref 70–99)
Glucose-Capillary: 159 mg/dL — ABNORMAL HIGH (ref 70–99)

## 2019-06-13 SURGERY — REMOVAL PORT-A-CATH
Anesthesia: Monitor Anesthesia Care | Site: Chest

## 2019-06-13 MED ORDER — FENTANYL CITRATE (PF) 250 MCG/5ML IJ SOLN
INTRAMUSCULAR | Status: AC
  Filled 2019-06-13: qty 5

## 2019-06-13 MED ORDER — GABAPENTIN 300 MG PO CAPS
ORAL_CAPSULE | ORAL | Status: AC
  Administered 2019-06-13: 300 mg via ORAL
  Filled 2019-06-13: qty 1

## 2019-06-13 MED ORDER — LACTATED RINGERS IV SOLN
INTRAVENOUS | Status: DC
  Administered 2019-06-13: 12:00:00 via INTRAVENOUS

## 2019-06-13 MED ORDER — LIDOCAINE 2% (20 MG/ML) 5 ML SYRINGE
INTRAMUSCULAR | Status: DC | PRN
  Administered 2019-06-13: 40 mg via INTRAVENOUS

## 2019-06-13 MED ORDER — LIDOCAINE HCL (PF) 1 % IJ SOLN
INTRAMUSCULAR | Status: DC | PRN
  Administered 2019-06-13: 5 mL

## 2019-06-13 MED ORDER — CEFAZOLIN SODIUM-DEXTROSE 2-4 GM/100ML-% IV SOLN
INTRAVENOUS | Status: AC
  Filled 2019-06-13: qty 100

## 2019-06-13 MED ORDER — PROPOFOL 500 MG/50ML IV EMUL
INTRAVENOUS | Status: DC | PRN
  Administered 2019-06-13: 100 ug/kg/min via INTRAVENOUS

## 2019-06-13 MED ORDER — OXYCODONE HCL 5 MG PO TABS: 5.0000 mg | ORAL_TABLET | Freq: Once | ORAL | Status: DC | PRN

## 2019-06-13 MED ORDER — GABAPENTIN 300 MG PO CAPS
300.0000 mg | ORAL_CAPSULE | ORAL | Status: AC
  Administered 2019-06-13: 12:00:00 300 mg via ORAL

## 2019-06-13 MED ORDER — ACETAMINOPHEN 500 MG PO TABS
ORAL_TABLET | ORAL | Status: AC
  Administered 2019-06-13: 1000 mg via ORAL
  Filled 2019-06-13: qty 2

## 2019-06-13 MED ORDER — ACETAMINOPHEN 500 MG PO TABS
1000.0000 mg | ORAL_TABLET | ORAL | Status: AC
  Administered 2019-06-13: 12:00:00 1000 mg via ORAL

## 2019-06-13 MED ORDER — OXYCODONE HCL 5 MG/5ML PO SOLN: 5.0000 mg | Freq: Once | ORAL | Status: DC | PRN

## 2019-06-13 MED ORDER — ACETAMINOPHEN 325 MG PO TABS: 325.0000 mg | ORAL_TABLET | ORAL | Status: DC | PRN

## 2019-06-13 MED ORDER — PROPOFOL 10 MG/ML IV BOLUS
INTRAVENOUS | Status: AC
  Filled 2019-06-13: qty 20

## 2019-06-13 MED ORDER — ONDANSETRON HCL 4 MG/2ML IJ SOLN: 4.0000 mg | Freq: Once | INTRAMUSCULAR | Status: DC | PRN

## 2019-06-13 MED ORDER — ACETAMINOPHEN 160 MG/5ML PO SOLN: 325.0000 mg | ORAL | Status: DC | PRN

## 2019-06-13 MED ORDER — CHLORHEXIDINE GLUCONATE CLOTH 2 % EX PADS: 6.0000 | MEDICATED_PAD | Freq: Once | CUTANEOUS | Status: DC

## 2019-06-13 MED ORDER — FENTANYL CITRATE (PF) 100 MCG/2ML IJ SOLN: 25.0000 ug | INTRAMUSCULAR | Status: DC | PRN

## 2019-06-13 MED ORDER — CEFAZOLIN SODIUM-DEXTROSE 2-4 GM/100ML-% IV SOLN
2.0000 g | INTRAVENOUS | Status: AC
  Administered 2019-06-13: 2 g via INTRAVENOUS

## 2019-06-13 MED ORDER — LIDOCAINE HCL (PF) 1 % IJ SOLN
INTRAMUSCULAR | Status: AC
  Filled 2019-06-13: qty 30

## 2019-06-13 MED ORDER — MEPERIDINE HCL 25 MG/ML IJ SOLN: 6.2500 mg | INTRAMUSCULAR | Status: DC | PRN

## 2019-06-13 MED ORDER — OXYCODONE HCL 5 MG PO TABS: 5.0000 mg | ORAL_TABLET | Freq: Four times a day (QID) | ORAL | 0 refills | Status: DC | PRN

## 2019-06-13 SURGICAL SUPPLY — 28 items
CHLORAPREP W/TINT 10.5 ML (MISCELLANEOUS) ×2 IMPLANT
COVER SURGICAL LIGHT HANDLE (MISCELLANEOUS) ×2 IMPLANT
COVER WAND RF STERILE (DRAPES) ×2 IMPLANT
DECANTER SPIKE VIAL GLASS SM (MISCELLANEOUS) ×4 IMPLANT
DERMABOND ADVANCED (GAUZE/BANDAGES/DRESSINGS) ×1
DERMABOND ADVANCED .7 DNX12 (GAUZE/BANDAGES/DRESSINGS) ×1 IMPLANT
DRAPE LAPAROTOMY 100X72 PEDS (DRAPES) ×2 IMPLANT
ELECT CAUTERY BLADE 6.4 (BLADE) ×4 IMPLANT
ELECT REM PT RETURN 9FT ADLT (ELECTROSURGICAL) ×2
ELECTRODE REM PT RTRN 9FT ADLT (ELECTROSURGICAL) ×1 IMPLANT
GAUZE 4X4 16PLY RFD (DISPOSABLE) ×2 IMPLANT
GLOVE BIO SURGEON STRL SZ 6 (GLOVE) ×2 IMPLANT
GLOVE INDICATOR 6.5 STRL GRN (GLOVE) ×2 IMPLANT
GOWN STRL REUS W/ TWL LRG LVL3 (GOWN DISPOSABLE) ×1 IMPLANT
GOWN STRL REUS W/TWL 2XL LVL3 (GOWN DISPOSABLE) ×2 IMPLANT
GOWN STRL REUS W/TWL LRG LVL3 (GOWN DISPOSABLE) ×1
KIT BASIN OR (CUSTOM PROCEDURE TRAY) ×2 IMPLANT
KIT TURNOVER KIT B (KITS) ×2 IMPLANT
NEEDLE HYPO 25GX1X1/2 BEV (NEEDLE) ×2 IMPLANT
NS IRRIG 1000ML POUR BTL (IV SOLUTION) ×2 IMPLANT
PACK GENERAL/GYN (CUSTOM PROCEDURE TRAY) ×2 IMPLANT
PAD ARMBOARD 7.5X6 YLW CONV (MISCELLANEOUS) ×4 IMPLANT
SUT MON AB 4-0 PC3 18 (SUTURE) ×2 IMPLANT
SUT VIC AB 3-0 SH 27 (SUTURE) ×1
SUT VIC AB 3-0 SH 27X BRD (SUTURE) ×1 IMPLANT
SYR CONTROL 10ML LL (SYRINGE) ×2 IMPLANT
TOWEL GREEN STERILE (TOWEL DISPOSABLE) ×2 IMPLANT
TOWEL GREEN STERILE FF (TOWEL DISPOSABLE) ×2 IMPLANT

## 2019-06-13 NOTE — Transfer of Care (Signed)
Immediate Anesthesia Transfer of Care Note  Patient: Manuel Olson  Procedure(s) Performed: PORT REMOVAL (N/A Chest)  Patient Location: PACU  Anesthesia Type:MAC  Level of Consciousness: awake and patient cooperative  Airway & Oxygen Therapy: Patient Spontanous Breathing  Post-op Assessment: Report given to RN, Post -op Vital signs reviewed and stable and Patient moving all extremities  Post vital signs: Reviewed and stable  Last Vitals:  Vitals Value Taken Time  BP    Temp    Pulse 57 06/13/19 1437  Resp 16 06/13/19 1437  SpO2 100 % 06/13/19 1437  Vitals shown include unvalidated device data.  Last Pain:  Vitals:   06/13/19 1157  TempSrc:   PainSc: 0-No pain      Patients Stated Pain Goal: 3 (20/80/22 3361)  Complications: No apparent anesthesia complications

## 2019-06-13 NOTE — Progress Notes (Signed)
Patient stated that he had oatmeal at [phone].   Dr. Ambrose Pancoast aware and ok to proceed.

## 2019-06-13 NOTE — Anesthesia Postprocedure Evaluation (Signed)
Anesthesia Post Note  Patient: Manuel Olson  Procedure(s) Performed: PORT REMOVAL (N/A Chest)     Patient location during evaluation: PACU Anesthesia Type: MAC Level of consciousness: awake and alert Pain management: pain level controlled Vital Signs Assessment: post-procedure vital signs reviewed and stable Respiratory status: spontaneous breathing, nonlabored ventilation, respiratory function stable and patient connected to nasal cannula oxygen Cardiovascular status: stable and blood pressure returned to baseline Postop Assessment: no apparent nausea or vomiting Anesthetic complications: no    Last Vitals:  Vitals:   06/13/19 1445 06/13/19 1452  BP:  (!) 160/85  Pulse: 62 (!) 59  Resp: 12 13  Temp: (!) 36.2 C   SpO2: 98% 99%    Last Pain:  Vitals:   06/13/19 1502  TempSrc:   PainSc: 0-No pain                 Girtha Kilgore

## 2019-06-13 NOTE — Op Note (Signed)
  PRE-OPERATIVE DIAGNOSIS:  un-needed Port-A-Cath for pancreatic cancer  POST-OPERATIVE DIAGNOSIS:  Same   PROCEDURE:  Procedure(s):  REMOVAL PORT-A-CATH  SURGEON:  Surgeon(s):  [name], MD  ANESTHESIA:   MAC + local  EBL:   Minimal  SPECIMEN:  None  Complications : none known  Procedure:   Pt was  identified in the holding area and taken to the operating room where she was placed supine on the operating room table.  MAC anesthesia was induced.  The left upper chest was prepped and draped.  The prior incision was anesthetized with local anesthetic.  The incision was opened with a #15 blade.  The subcutaneous tissue was divided with the cautery.  The port was identified and the capsule opened.  The four 2-0 prolene sutures were removed.  The port was then removed and pressure held on the tract.  The catheter appeared intact without evidence of breakage, length was 25 cm.  The wound was inspected for hemostasis, which was achieved with cautery.  The wound was closed with 3-0 vicryl deep dermal interrupted sutures and 4-0 Monocryl running subcuticular suture.  The wound was cleaned, dried, and dressed with dermabond.  The patient was awakened from anesthesia and taken to the PACU in stable condition.  Needle, sponge, and instrument counts are correct.

## 2019-06-13 NOTE — Progress Notes (Signed)
Per Dr. Ambrose Pancoast,  No labs needed at this time, in Surgical Short Stay.

## 2019-06-13 NOTE — Interval H&P Note (Signed)
History and Physical Interval Note:  06/13/2019 11:12 AM  Manuel Olson  has presented today for surgery, with the diagnosis of PANCREATIC CANCER.  The various methods of treatment have been discussed with the patient and family. After consideration of risks, benefits and other options for treatment, the patient has consented to  Procedure(s): PORT REMOVAL (N/A) as a surgical intervention.  The patient's history has been reviewed, patient examined, no change in status, stable for surgery.  I have reviewed the patient's chart and labs.  Questions were answered to the patient's satisfaction.     [name]

## 2019-06-13 NOTE — H&P (Signed)
Manuel Olson Appointment: 05/31/2019 10:45 AM Location: [facility] Patient #: [id number] DOB: 1946-02-12 Married / Language: Manuel Olson / Race: White Male   History of Present Illness Manuel Klein MD; 05/31/2019 11:12 AM) The patient is a 73 year old male who presents for a follow-up for Pancreatic cancer. Patient is a 73 year old male referred by Dr. 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 This mass was discovered incidentally on a CT angiogram for workup for aortic valve repair. He also had an incidentally discovered left renal mass concerning for cancer. He had severe aortic stenosis and needed valve repair. He was totally asymptomatic from an abdominal standpoint. He had previously been thought to be very healthy until he was visiting [place] and had an MI in March 2018. He had cardiac catheterization and had 4 stents placed. He was discharged on aspirin but was switched to Plavix. He remained on Plavix and has not had any angina. He Does Note That He Has Had a Heart Murmur since He Was a Child. He Is a Retired Engineer, structural.  He had TAVR in January 2019 which was uneventful. He also received port and received neoadjuvant chemotherapy.   He underwent Whipple and Left nephrectomy 02/19/18. He was discharged to rehab 02/26/18 and did quite well, going home 03/02/2018   He has picked up a bit of weight since last visit. He is up by around 6 pounds. He continues to have low CA 19-9 ( 7 at last check 04/30/2019 and 42 at diagnosis). He remains on creon. He remains on plavix. He has not had any new health problems. He denies abdominal pain. He is eating well. He is not snacking as much as it makes his blood sugars "crazy."     pathology 02/19/18 Diagnosis 1. Liver, biopsy, Right lobe - BENIGN FIBROUS TISSUE. - THERE IS NO EVIDENCE OF MALIGNANCY. 2. Whipple procedure/resection, w/ section of portal vein and  gallbladder AMENDED DIAGNOSIS: - INVASIVE ADENOCARCINOMA, MODERATELY DIFFERENTIATED, SPANNING 2.0 CM, WITH EXTRAPANCREATIC EXTENSION. - ADENOCARCINOMA IS BROADLY PRESENT AT THE ADHERENT PORTAL VEIN SURFACE, ALTHOUGH THE PORTAL VEIN MARGIN APPEARS NEGATIVE FOR ADENOCARCINOMA. - PERINEURAL INVASION IS IDENTIFIED. - LYMPHOVASCULAR INVASION IS IDENTIFIED. - SEPARATE MULTILOCULAR SEROUS CYST, 0.9 CM. - GALL BLADDER WITH CHRONIC CHOLECYSTITIS AND CHOLELITHIASIS. - METASTATIC CARCINOMA IN 2 OF 18 LYMPH NODES (2/18). - SEE ONCOLOGY TABLE BELOW.  3. 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 - ESSENTIALLY UNREMARKABLE ADRENAL GLAND. - THE SURGICAL RESECTION MARGINS ARE NEGATIVE FOR CARCINOMA. - SEE ONCOLOGY TABLE BELOW.   Allergies (Manuel Olson, CMA; 05/31/2019 10:54 AM) No Known Drug Allergies  [08/08/2017]:  Medication History (Manuel Olson, CMA; 05/31/2019 10:55 AM) Atorvastatin Calcium (20MG Tablet, Oral) Active. Plavix (75MG Tablet, Oral) Active. Creon (36000UNIT Capsule DR Part, Oral) Active. Insulin Glargine (100UNIT/ML Soln Cartridge, Subcutaneous) Active. Medications Reconciled    Review of Systems Manuel Klein MD; 05/31/2019 11:12 AM) All other systems negative  Vitals (Manuel Olson CMA; 05/31/2019 10:56 AM) 05/31/2019 10:55 AM Weight: 136.5 lb Height: 70in Body Surface Area: 1.77 m Body Mass Index: 19.59 kg/m  Temp.: 43F (Oral)  Pulse: 68 (Regular)  BP: 132/88(Sitting, Left Arm, Standard)       Physical Exam Manuel Klein MD; 05/31/2019 11:13 AM) General Mental Status-Alert. General Appearance-Consistent with stated age. Hydration-Well hydrated. Voice-Normal. Note: thin, but more healthy appearing.   Head and Neck Head-normocephalic, atraumatic with no lesions or palpable masses.  Eye Sclera/Conjunctiva - Bilateral-No scleral  icterus.  Chest and Lung Exam Chest and lung exam reveals -quiet, even and easy respiratory effort with no use of accessory muscles. Inspection Chest Wall - Normal. Back - normal. Note: port in place left upper chest.   Breast - Did not examine.  Cardiovascular Cardiovascular examination reveals -normal pedal pulses bilaterally. Note: regular rate and rhythm  Abdomen Inspection-Inspection Normal. Palpation/Percussion Palpation and Percussion of the abdomen reveal - Soft, Non Tender, No Rebound tenderness, No Rigidity (guarding) and No hepatosplenomegaly. Note: no evidence of hernia   Peripheral Vascular Upper Extremity Inspection - Bilateral - Normal - No Clubbing, No Cyanosis, No Edema, Pulses Intact. Lower Extremity Palpation - Edema - Bilateral - No edema - Bilateral.  Neurologic Neurologic evaluation reveals -alert and oriented x 3 with no impairment of recent or remote memory. Mental Status-Normal.  Musculoskeletal Global Assessment -Note: no gross deformities.  Normal Exam - Left-Upper Extremity Strength Normal and Lower Extremity Strength Normal. Normal Exam - Right-Upper Extremity Strength Normal and Lower Extremity Strength Normal.  Lymphatic Head & Neck  General Head & Neck Lymphatics: Bilateral - Description - Normal. Axillary  General Axillary Region: Bilateral - Description - Normal. Tenderness - Non Tender.    Assessment & Plan Manuel Klein MD; 05/31/2019 11:13 AM)  ADENOCARCINOMA OF HEAD OF PANCREAS (C25.0) Impression: No clinical evidence of disease.  Ok to remove port at this point. Discussed risks.  Follow up after port removal.  Current Plans You are being scheduled for surgery- Our schedulers will call you.  You should hear from our office's scheduling department within 5 working days about the location, date, and time of surgery. We try to make accommodations for patient's preferences in scheduling surgery, but  sometimes the OR schedule or the surgeon's schedule prevents [place] from making those accommodations.  If you have not heard from our office [phone]) in 5 working days, call the office and ask for your surgeon's nurse.  If you have other questions about your diagnosis, plan, or surgery, call the office and ask for your surgeon's nurse.   PORT-A-CATH IN PLACE (Z48.270) Impression: Removal to be set up.    Signed by Manuel Klein, MD (05/31/2019 11:14 AM)

## 2019-06-13 NOTE — Discharge Instructions (Addendum)
[facility] Office Phone Number [phone]   POST OP INSTRUCTIONS  Always review your discharge instruction sheet given to you by the facility where your surgery was performed.  IF YOU HAVE DISABILITY OR FAMILY LEAVE FORMS, YOU MUST BRING THEM TO THE OFFICE FOR PROCESSING.  DO NOT GIVE THEM TO YOUR DOCTOR.  1. A prescription for pain medication may be given to you upon discharge.  Take your pain medication as prescribed, if needed.  If narcotic pain medicine is not needed, then you may take acetaminophen (Tylenol) or ibuprofen (Advil) as needed. 2. Take your usually prescribed medications unless otherwise directed 3. If you need a refill on your pain medication, please contact your pharmacy.  They will contact our office to request authorization.  Prescriptions will not be filled after 5pm or on week-ends. 4. You should eat very light the first 24 hours after surgery, such as soup, crackers, pudding, etc.  Resume your normal diet the day after surgery 5. It is common to experience some constipation if taking pain medication after surgery.  Increasing fluid intake and taking a stool softener will usually help or prevent this problem from occurring.  A mild laxative (Milk of Magnesia or Miralax) should be taken according to package directions if there are no bowel movements after 48 hours. 6. You may shower in 48 hours.  The surgical glue will flake off in 2-3 weeks.   7. ACTIVITIES:  No strenuous activity or heavy lifting for 1 week.   a. You may drive when you no longer are taking prescription pain medication, you can comfortably wear a seatbelt, and you can safely maneuver your car and apply brakes. b. RETURN TO WORK:  __________1 week if applicable_______________ Dennis Bast should see your doctor in the office for a follow-up appointment approximately three-four weeks after your surgery.    WHEN TO CALL YOUR DOCTOR: 1. Fever over 101.0 2. Nausea and/or vomiting. 3. Extreme  swelling or bruising. 4. Continued bleeding from incision. 5. Increased pain, redness, or drainage from the incision.  The clinic staff is available to answer your questions during regular business hours.  Please dont hesitate to call and ask to speak to one of the nurses for clinical concerns.  If you have a medical emergency, go to the nearest emergency room or call 911.  A surgeon from [facility] is always on call at the hospital.  For further questions, please visit [email or web address]

## 2019-06-14 ENCOUNTER — Encounter (HOSPITAL_COMMUNITY): Payer: Self-pay | Admitting: General Surgery

## 2019-06-27 ENCOUNTER — Other Ambulatory Visit: Payer: Self-pay | Admitting: Cardiology

## 2019-07-29 DIAGNOSIS — C642 Malignant neoplasm of left kidney, except renal pelvis: Secondary | ICD-10-CM | POA: Diagnosis not present

## 2019-08-05 DIAGNOSIS — C642 Malignant neoplasm of left kidney, except renal pelvis: Secondary | ICD-10-CM | POA: Diagnosis not present

## 2019-08-05 DIAGNOSIS — N200 Calculus of kidney: Secondary | ICD-10-CM | POA: Diagnosis not present

## 2019-08-06 ENCOUNTER — Ambulatory Visit (HOSPITAL_COMMUNITY)
Admission: RE | Admit: 2019-08-06 | Discharge: 2019-08-06 | Disposition: A | Discharge status: Home / Self Care | Payer: Medicare Other | Source: Ambulatory Visit | Attending: Cardiology | Admitting: Cardiology

## 2019-08-06 ENCOUNTER — Other Ambulatory Visit: Payer: Self-pay

## 2019-08-06 DIAGNOSIS — Z952 Presence of prosthetic heart valve: Secondary | ICD-10-CM

## 2019-08-06 NOTE — Progress Notes (Signed)
*  PRELIMINARY RESULTS* Echocardiogram 2D Echocardiogram has been performed.  Manuel Olson 08/06/2019, 11:10 AM

## 2019-08-12 ENCOUNTER — Encounter: Payer: Self-pay | Admitting: Cardiology

## 2019-08-12 ENCOUNTER — Ambulatory Visit (INDEPENDENT_AMBULATORY_CARE_PROVIDER_SITE_OTHER): Payer: Medicare Other | Admitting: Cardiology

## 2019-08-12 ENCOUNTER — Other Ambulatory Visit: Payer: Self-pay

## 2019-08-12 VITALS — BP 164/75 | HR 73 | Temp 95.9°F | Ht 69.0 in | Wt 137.0 lb

## 2019-08-12 DIAGNOSIS — I25119 Atherosclerotic heart disease of native coronary artery with unspecified angina pectoris: Secondary | ICD-10-CM

## 2019-08-12 DIAGNOSIS — E782 Mixed hyperlipidemia: Secondary | ICD-10-CM | POA: Diagnosis not present

## 2019-08-12 DIAGNOSIS — Z952 Presence of prosthetic heart valve: Secondary | ICD-10-CM | POA: Diagnosis not present

## 2019-08-12 DIAGNOSIS — I10 Essential (primary) hypertension: Secondary | ICD-10-CM | POA: Diagnosis not present

## 2019-08-12 DIAGNOSIS — I255 Ischemic cardiomyopathy: Secondary | ICD-10-CM

## 2019-08-12 MED ORDER — LOSARTAN POTASSIUM 25 MG PO TABS: 12.5000 mg | ORAL_TABLET | Freq: Every day | ORAL | 3 refills | Status: DC

## 2019-08-12 NOTE — Patient Instructions (Signed)
Medication Instructions:  START LOSARTAN 12.5 MG ONCE EVERY EVENING   Labwork: NONE  Testing/Procedures: NONE  Follow-Up: Your physician wants you to follow-up in: 6 MONTHS.  You will receive a reminder letter in the mail two months in advance. If you don't receive a letter, please call our office to schedule the follow-up appointment.   Any Other Special Instructions Will Be Listed Below (If Applicable).  PLEASE LET DR. MCDOWELL KNOW IF BLOOD PRESSURES GET LOW    If you need a refill on your cardiac medications before your next appointment, please call your pharmacy.

## 2019-08-12 NOTE — Addendum Note (Signed)
Addended by: Debbora Lacrosse R on: 08/12/2019 01:51 PM   Modules accepted: Orders

## 2019-08-12 NOTE — Progress Notes (Signed)
Cardiology Office Note  Date: 08/12/2019   ID: Manuel Olson April 18, 1946, MRN [id number]  PCP:  [name], MD  Cardiologist:  Rozann Lesches, MD Electrophysiologist:  None   Chief Complaint  Patient presents with  . Cardiac follow-up    History of Present Illness: Manuel Olson is a 73 y.o. male last seen in July.  He presents for a routine visit.  He tells me that he has been doing reasonably well.  His wife assists him with all of his medications, they have also been tracking blood pressure at home.  He has not had any hypotension or progressive dizziness, in fact his blood pressure trend has been up.  I talked with him about resuming losartan at very low-dose.  Recent follow-up echocardiogram showed LVEF stable at 83%, mild diastolic dysfunction, stable TAVR function with mean gradient 9 mmHg and no significant aortic regurgitation.  I went over these results with him today.  He underwent removal of Port-A-Cath in October, was temporarily off Plavix at that time.  He is back on the medication.  Past Medical History:  Diagnosis Date  . Arthritis   . CAD (coronary artery disease)    a. 11/2016 in [place]: STEMI s/p DES to mid LAD, DES to diagonal, DES x 2 to proximal and mid RCA   . Essential hypertension   . Full dentures   . GERD (gastroesophageal reflux disease)   . History of stroke   . Hyperlipidemia   . Myocardial infarction ([place])   . Pancreatic cancer ([place])   . Pneumonia   . Renal cell carcinoma ([place])   . S/P TAVR (transcatheter aortic valve replacement)    a. 08/2017:  Edwards Sapien 3 THV (size 26 mm, model #9600CM26A , serial # [id number])  . Severe aortic stenosis    a. 08/2017: s/p TAVR by Dr. Angelena Form and Dr. Cyndia Bent.   . Type 2 diabetes mellitus ([place])   . Wears glasses   . Wears hearing aid     Past Surgical History:  Procedure Laterality Date  . APPENDECTOMY    . CARDIAC CATHETERIZATION    . CORONARY STENT PLACEMENT     x 4 in March  2018  . EUS N/A 08/02/2017   Procedure: UPPER ENDOSCOPIC ULTRASOUND (EUS) RADIAL;  Surgeon: [name], MD;  Location: [facility];  Service: Endoscopy;  Laterality: N/A;  . EYE SURGERY Left   . HAND SURGERY Bilateral   . HERNIA REPAIR Right   . LAPAROSCOPY N/A 02/19/2018   Procedure: DIAGNOSTIC LAPAROSCOPY, ERAS PATHWAY;  Surgeon: [name], MD;  Location: [facility];  Service: General;  Laterality: N/A;  . MULTIPLE TOOTH EXTRACTIONS    . NEPHRECTOMY Left 02/19/2018   Procedure: OPEN LEFT RADICAL NEPHRECTOMY;  Surgeon: Cleon Gustin, MD;  Location: [facility];  Service: Urology;  Laterality: Left;  . PORT-A-CATH REMOVAL N/A 06/13/2019   Procedure: PORT REMOVAL;  Surgeon: [name], MD;  Location: [facility];  Service: General;  Laterality: N/A;  . PORTACATH PLACEMENT N/A 09/15/2017   Procedure: INSERTION PORT-A-CATH;  Surgeon: [name], MD;  Location: [facility];  Service: General;  Laterality: N/A;  . RIGHT/LEFT HEART CATH AND CORONARY ANGIOGRAPHY N/A 07/05/2017   Procedure: RIGHT/LEFT HEART CATH AND CORONARY ANGIOGRAPHY;  Surgeon: [name], MD;  Location: [facility];  Service: Cardiovascular;  Laterality: N/A;  . SHOULDER ARTHROSCOPY WITH ROTATOR CUFF REPAIR Left   . TEE WITHOUT CARDIOVERSION N/A 08/22/2017   Procedure:  TRANSESOPHAGEAL ECHOCARDIOGRAM (TEE);  Surgeon: Burnell Blanks, MD;  Location: [facility];  Service: Open Heart Surgery;  Laterality: N/A;  . TRANSCATHETER AORTIC VALVE REPLACEMENT, TRANSFEMORAL N/A 08/22/2017   Procedure: TRANSCATHETER AORTIC VALVE REPLACEMENT, TRANSFEMORAL;  Surgeon: Burnell Blanks, MD;  Location: [place] [facility];  Service: Open Heart Surgery;  Laterality: N/A;  . WHIPPLE PROCEDURE N/A 02/19/2018   Procedure: WHIPPLE PROCEDURE;  Surgeon: [name], MD;  Location: [facility];  Service: General;  Laterality: N/A;    Current Outpatient Medications  Medication Sig Dispense Refill  . acetaminophen (TYLENOL) 325 MG  tablet Take 650 mg by mouth every 6 (six) hours as needed (for pain.).    [name] atorvastatin (LIPITOR) 20 MG tablet TAKE 1 TABLET BY MOUTH ONCE DAILY. 90 tablet 3  . clopidogrel (PLAVIX) 75 MG tablet TAKE 1 TABLET BY MOUTH ONCE DAILY. (Patient taking differently: Take 75 mg by mouth daily. ) 30 tablet 10  . CREON 36000 units CPEP capsule TAKE 1 CAPSULE BY MOUTH BEFORE EACH MEAL AND SNACK. TAKE UP TO 6 CAPSULES PER DAY. (Patient taking differently: Take 36,000 Units by mouth See admin instructions. Take 1 capsule before each meal and with snacks.) 90 capsule 0  . Insulin Glargine (BASAGLAR KWIKPEN) 100 UNIT/ML SOPN Inject 0.1 mLs (10 Units total) into the skin at bedtime. (Patient taking differently: Inject 9 Units into the skin daily. ) 5 pen 5  . insulin lispro (HUMALOG KWIKPEN) 100 UNIT/ML KwikPen Inject 3-5 units 3 times a day with meals. (Patient taking differently: Inject 5 Units into the skin 3 (three) times daily before meals. Inject 3-5 units 3 times a day with meals.) 15 mL 11  . Insulin Pen Needle (PEN NEEDLES) 31G X 6 MM MISC Use as directed to check blood sugar 4 times a day. 400 each 2  . oxyCODONE (OXY IR/ROXICODONE) 5 MG immediate release tablet Take 1 tablet (5 mg total) by mouth every 6 (six) hours as needed for severe pain. 10 tablet 0  . losartan (COZAAR) 25 MG tablet Take 0.5 tablets (12.5 mg total) by mouth daily. 45 tablet 3   No current facility-administered medications for this visit.    Allergies:  Patient has no known allergies.   Social History: The patient  reports that he quit smoking about 3 years ago. His smoking use included cigarettes. He started smoking about 60 years ago. He has a 57.00 pack-year smoking history. He has never used smokeless tobacco. He reports that he does not drink alcohol or use drugs.   ROS:  Please see the history of present illness. Otherwise, complete review of systems is positive for chronic hearing loss.  All other systems are reviewed and  negative.   Physical Exam: VS:  BP (!) 164/75   Pulse 73   Temp (!) 95.9 F (35.5 C) (Temporal)   Ht 5' 9"  (1.753 m)   Wt 137 lb (62.1 kg)   SpO2 95%   BMI 20.23 kg/m , BMI Body mass index is 20.23 kg/m.  Wt Readings from Last 3 Encounters:  08/12/19 137 lb (62.1 kg)  06/13/19 137 lb (62.1 kg)  05/14/19 136 lb (61.7 kg)    General: Elderly male, appears comfortable at rest. HEENT: Conjunctiva and lids normal, wearing a mask. Neck: Supple, no elevated JVP or carotid bruits, no thyromegaly. Lungs: Clear to auscultation, nonlabored breathing at rest. Cardiac: Regular rate and rhythm, no S3, 2/6 systolic murmur. Abdomen: Soft, nontender,bowel sounds present. Extremities: No pitting edema, distal pulses 2+. Skin:  Warm and dry. Musculoskeletal: No kyphosis. Neuropsychiatric: Alert and oriented x3, affect grossly appropriate.  ECG:  An ECG dated 09/27/2018 was personally reviewed today and demonstrated:  Sinus rhythm with poor R wave progression consistent with previous anterior infarct, nonspecific ST-T changes.  Recent Labwork: 04/30/2019: ALT 37; AST 33; BUN 23; Creatinine 1.50; Hemoglobin 10.8; Platelet Count 108; Potassium 4.2; Sodium 137     Component Value Date/Time   CHOL 88 10/12/2018 0957   TRIG 55.0 10/12/2018 0957   HDL 43.20 10/12/2018 0957   CHOLHDL 2 10/12/2018 0957   VLDL 11.0 10/12/2018 0957   LDLCALC 34 10/12/2018 0957    Other Studies Reviewed Today:  Echocardiogram 08/06/2019:  1. Left ventricular ejection fraction, by visual estimation, is 50%. The left ventricle has low normal function. There is borderline left ventricular hypertrophy.  2. Elevated left atrial pressure.  3. Left ventricular diastolic parameters are consistent with Grade I diastolic dysfunction (impaired relaxation).  4. The left ventricle demonstrates global hypokinesis.  5. Global right ventricle has normal systolic function.The right ventricular size is normal. No increase in right  ventricular wall thickness.  6. Left atrial size was normal.  7. Right atrial size was normal.  8. Presence of pericardial fat pad.  9. Mild to moderate mitral annular calcification. 10. The mitral valve is grossly normal. Trace mitral valve regurgitation. 11. The tricuspid valve is grossly normal. Tricuspid valve regurgitation is trivial. 12. A 76m Edwards Sapien 3 THV is present in the aortic position. No perivalvular leak. 13. Aortic valve mean gradient measures 9.0 mmHg. 14. Aortic valve regurgitation is not visualized. 15. The pulmonic valve was grossly normal. Pulmonic valve regurgitation is trivial. 16. The inferior vena cava is dilated in size with >50% respiratory variability, suggesting right atrial pressure of 8 mmHg. 17. TR signal is inadequate for assessing pulmonary artery systolic pressure.  Assessment and Plan:  1.  Severe aortic stenosis status post TAVR in December 2018.  Recent follow-up echocardiogram shows normal valve function and no perivalvular leak.  2.  Multivessel CAD status post DES to the LAD, DES to the diagonal, and DES x2 to the RCA in 2018 ([facility].  He remains on Plavix and statin.  No active angina symptoms.  3.  History of cardiomyopathy, LVEF 50% by recent follow-up echocardiogram.  4.  Essential hypertension, has prior history of orthostasis which has resolved and he has no longer on Florinef.  Based on most recent blood pressure trend we will try and resume losartan at 12.5 mg once in the evening.  Continue to track blood pressure at home.  Check BMET in 7 to 10 days.  5.  CKD stage III, last creatinine 1.5.  Medication Adjustments/Labs and Tests Ordered: Current medicines are reviewed at length with the patient today.  Concerns regarding medicines are outlined above.   Tests Ordered: No orders of the defined types were placed in this encounter.   Medication Changes: Meds ordered this encounter  Medications  . losartan (COZAAR) 25 MG tablet     Sig: Take 0.5 tablets (12.5 mg total) by mouth daily.    Dispense:  45 tablet    Refill:  3    Disposition:  [place].  Signed, SSatira Sark MD, F[facility]11/30/2020 1:49 PM    CSan Bernardinoat AH[facility] [place] [address]Phone: [phone] Fax: [phone]

## 2019-08-15 DIAGNOSIS — C642 Malignant neoplasm of left kidney, except renal pelvis: Secondary | ICD-10-CM | POA: Diagnosis not present

## 2019-08-21 ENCOUNTER — Telehealth: Payer: Self-pay | Admitting: *Deleted

## 2019-08-21 NOTE — Telephone Encounter (Signed)
Asking if Dr. Benay Spice will complete 1 page of form he is bringing in during visit next week to renew his Creon with pharmaceutical company. She will bring copy of last year's form to help with completion of new form

## 2019-08-22 ENCOUNTER — Other Ambulatory Visit: Payer: Self-pay

## 2019-08-22 ENCOUNTER — Encounter (HOSPITAL_COMMUNITY): Payer: Self-pay

## 2019-08-22 ENCOUNTER — Inpatient Hospital Stay (HOSPITAL_COMMUNITY)
Admission: EM | Admit: 2019-08-22 | Discharge: 2019-08-24 | DRG: 247 | Disposition: A | Discharge status: Home / Self Care | Payer: Medicare Other | Attending: Internal Medicine | Admitting: Internal Medicine

## 2019-08-22 ENCOUNTER — Emergency Department (HOSPITAL_COMMUNITY): Payer: Medicare Other

## 2019-08-22 ENCOUNTER — Telehealth: Payer: Self-pay | Admitting: Cardiology

## 2019-08-22 DIAGNOSIS — E1122 Type 2 diabetes mellitus with diabetic chronic kidney disease: Secondary | ICD-10-CM | POA: Diagnosis present

## 2019-08-22 DIAGNOSIS — I200 Unstable angina: Secondary | ICD-10-CM | POA: Diagnosis not present

## 2019-08-22 DIAGNOSIS — Z8507 Personal history of malignant neoplasm of pancreas: Secondary | ICD-10-CM | POA: Diagnosis not present

## 2019-08-22 DIAGNOSIS — Z955 Presence of coronary angioplasty implant and graft: Secondary | ICD-10-CM | POA: Diagnosis not present

## 2019-08-22 DIAGNOSIS — N183 Chronic kidney disease, stage 3 unspecified: Secondary | ICD-10-CM | POA: Diagnosis present

## 2019-08-22 DIAGNOSIS — Z85528 Personal history of other malignant neoplasm of kidney: Secondary | ICD-10-CM | POA: Diagnosis not present

## 2019-08-22 DIAGNOSIS — Z952 Presence of prosthetic heart valve: Secondary | ICD-10-CM | POA: Diagnosis not present

## 2019-08-22 DIAGNOSIS — Z90411 Acquired partial absence of pancreas: Secondary | ICD-10-CM

## 2019-08-22 DIAGNOSIS — I129 Hypertensive chronic kidney disease with stage 1 through stage 4 chronic kidney disease, or unspecified chronic kidney disease: Secondary | ICD-10-CM | POA: Diagnosis present

## 2019-08-22 DIAGNOSIS — I252 Old myocardial infarction: Secondary | ICD-10-CM | POA: Diagnosis not present

## 2019-08-22 DIAGNOSIS — K219 Gastro-esophageal reflux disease without esophagitis: Secondary | ICD-10-CM | POA: Diagnosis present

## 2019-08-22 DIAGNOSIS — I25119 Atherosclerotic heart disease of native coronary artery with unspecified angina pectoris: Secondary | ICD-10-CM | POA: Diagnosis not present

## 2019-08-22 DIAGNOSIS — Z905 Acquired absence of kidney: Secondary | ICD-10-CM | POA: Diagnosis not present

## 2019-08-22 DIAGNOSIS — Z8673 Personal history of transient ischemic attack (TIA), and cerebral infarction without residual deficits: Secondary | ICD-10-CM | POA: Diagnosis not present

## 2019-08-22 DIAGNOSIS — Z87891 Personal history of nicotine dependence: Secondary | ICD-10-CM | POA: Diagnosis not present

## 2019-08-22 DIAGNOSIS — I2510 Atherosclerotic heart disease of native coronary artery without angina pectoris: Secondary | ICD-10-CM | POA: Diagnosis not present

## 2019-08-22 DIAGNOSIS — Z794 Long term (current) use of insulin: Secondary | ICD-10-CM

## 2019-08-22 DIAGNOSIS — N1831 Chronic kidney disease, stage 3a: Secondary | ICD-10-CM | POA: Diagnosis not present

## 2019-08-22 DIAGNOSIS — Z79899 Other long term (current) drug therapy: Secondary | ICD-10-CM | POA: Diagnosis not present

## 2019-08-22 DIAGNOSIS — I209 Angina pectoris, unspecified: Secondary | ICD-10-CM

## 2019-08-22 DIAGNOSIS — Z20828 Contact with and (suspected) exposure to other viral communicable diseases: Secondary | ICD-10-CM | POA: Diagnosis not present

## 2019-08-22 DIAGNOSIS — D696 Thrombocytopenia, unspecified: Secondary | ICD-10-CM | POA: Diagnosis present

## 2019-08-22 DIAGNOSIS — Z7902 Long term (current) use of antithrombotics/antiplatelets: Secondary | ICD-10-CM | POA: Diagnosis not present

## 2019-08-22 DIAGNOSIS — E782 Mixed hyperlipidemia: Secondary | ICD-10-CM | POA: Diagnosis present

## 2019-08-22 DIAGNOSIS — R079 Chest pain, unspecified: Secondary | ICD-10-CM | POA: Diagnosis not present

## 2019-08-22 DIAGNOSIS — I10 Essential (primary) hypertension: Secondary | ICD-10-CM | POA: Diagnosis not present

## 2019-08-22 DIAGNOSIS — I214 Non-ST elevation (NSTEMI) myocardial infarction: Secondary | ICD-10-CM | POA: Diagnosis not present

## 2019-08-22 LAB — TROPONIN I (HIGH SENSITIVITY)
Troponin I (High Sensitivity): 34 ng/L — ABNORMAL HIGH (ref <18)
Troponin I (High Sensitivity): 121 ng/L (ref <18)
Troponin I (High Sensitivity): 665 ng/L (ref <18)

## 2019-08-22 LAB — COMPREHENSIVE METABOLIC PANEL
ALT: 30 U/L (ref 0–44)
AST: 27 U/L (ref 15–41)
Albumin: 4 g/dL (ref 3.5–5.0)
Alkaline Phosphatase: 100 U/L (ref 38–126)
Anion gap: 9 (ref 5–15)
BUN: 28 mg/dL — ABNORMAL HIGH (ref 8–23)
CO2: 25 mmol/L (ref 22–32)
Calcium: 9 mg/dL (ref 8.9–10.3)
Chloride: 102 mmol/L (ref 98–111)
Creatinine, Ser: 1.48 mg/dL — ABNORMAL HIGH (ref 0.61–1.24)
GFR calc Af Amer: 53 mL/min — ABNORMAL LOW (ref >60)
GFR calc non Af Amer: 46 mL/min — ABNORMAL LOW (ref >60)
Glucose, Bld: 132 mg/dL — ABNORMAL HIGH (ref 70–99)
Potassium: 4.2 mmol/L (ref 3.5–5.1)
Sodium: 136 mmol/L (ref 135–145)
Total Bilirubin: 0.4 mg/dL (ref 0.3–1.2)
Total Protein: 6.9 g/dL (ref 6.5–8.1)

## 2019-08-22 LAB — CBC WITH DIFFERENTIAL/PLATELET
Abs Immature Granulocytes: 0.02 10*3/uL (ref 0.00–0.07)
Basophils Absolute: 0 10*3/uL (ref 0.0–0.1)
Basophils Relative: 0 %
Eosinophils Absolute: 0.1 10*3/uL (ref 0.0–0.5)
Eosinophils Relative: 1 %
HCT: 37.2 % — ABNORMAL LOW (ref 39.0–52.0)
Hemoglobin: 11.5 g/dL — ABNORMAL LOW (ref 13.0–17.0)
Immature Granulocytes: 0 %
Lymphocytes Relative: 13 %
Lymphs Abs: 0.9 10*3/uL (ref 0.7–4.0)
MCH: 31.1 pg (ref 26.0–34.0)
MCHC: 30.9 g/dL (ref 30.0–36.0)
MCV: 100.5 fL — ABNORMAL HIGH (ref 80.0–100.0)
Monocytes Absolute: 0.7 10*3/uL (ref 0.1–1.0)
Monocytes Relative: 10 %
Neutro Abs: 5.6 10*3/uL (ref 1.7–7.7)
Neutrophils Relative %: 76 %
Platelets: 133 10*3/uL — ABNORMAL LOW (ref 150–400)
RBC: 3.7 MIL/uL — ABNORMAL LOW (ref 4.22–5.81)
RDW: 13.9 % (ref 11.5–15.5)
WBC: 7.4 10*3/uL (ref 4.0–10.5)
nRBC: 0 % (ref 0.0–0.2)

## 2019-08-22 LAB — CBG MONITORING, ED: Glucose-Capillary: 188 mg/dL — ABNORMAL HIGH (ref 70–99)

## 2019-08-22 MED ORDER — POLYETHYLENE GLYCOL 3350 17 G PO PACK: 17.0000 g | PACK | Freq: Every day | ORAL | Status: DC | PRN

## 2019-08-22 MED ORDER — LOSARTAN POTASSIUM 25 MG PO TABS
12.5000 mg | ORAL_TABLET | Freq: Every day | ORAL | Status: DC
  Administered 2019-08-24: 12.5 mg via ORAL
  Filled 2019-08-22 (×2): qty 1

## 2019-08-22 MED ORDER — ACETAMINOPHEN 650 MG RE SUPP: 650.0000 mg | Freq: Four times a day (QID) | RECTAL | Status: DC | PRN

## 2019-08-22 MED ORDER — NITROGLYCERIN 0.4 MG SL SUBL: 0.4000 mg | SUBLINGUAL_TABLET | SUBLINGUAL | Status: DC | PRN

## 2019-08-22 MED ORDER — HYDRALAZINE HCL 20 MG/ML IJ SOLN
5.0000 mg | INTRAMUSCULAR | Status: DC | PRN
  Administered 2019-08-22: 5 mg via INTRAVENOUS
  Filled 2019-08-22: qty 1

## 2019-08-22 MED ORDER — ONDANSETRON HCL 4 MG PO TABS: 4.0000 mg | ORAL_TABLET | Freq: Four times a day (QID) | ORAL | Status: DC | PRN

## 2019-08-22 MED ORDER — INSULIN GLARGINE 100 UNIT/ML ~~LOC~~ SOLN
9.0000 [IU] | Freq: Every day | SUBCUTANEOUS | Status: DC
  Administered 2019-08-24: 9 [IU] via SUBCUTANEOUS
  Filled 2019-08-22 (×2): qty 0.09

## 2019-08-22 MED ORDER — BASAGLAR KWIKPEN 100 UNIT/ML ~~LOC~~ SOPN: 9.0000 [IU] | PEN_INJECTOR | Freq: Every day | SUBCUTANEOUS | Status: DC

## 2019-08-22 MED ORDER — NITROGLYCERIN IN D5W 200-5 MCG/ML-% IV SOLN
INTRAVENOUS | Status: AC
  Filled 2019-08-22: qty 250

## 2019-08-22 MED ORDER — ATORVASTATIN CALCIUM 10 MG PO TABS
20.0000 mg | ORAL_TABLET | Freq: Every day | ORAL | Status: DC
  Administered 2019-08-23 – 2019-08-24 (×2): 20 mg via ORAL
  Filled 2019-08-22 (×2): qty 2

## 2019-08-22 MED ORDER — NITROGLYCERIN IN D5W 200-5 MCG/ML-% IV SOLN
0.0000 ug/min | INTRAVENOUS | Status: DC
  Administered 2019-08-22: 10 ug/min via INTRAVENOUS

## 2019-08-22 MED ORDER — ACETAMINOPHEN 325 MG PO TABS: 650.0000 mg | ORAL_TABLET | Freq: Four times a day (QID) | ORAL | Status: DC | PRN

## 2019-08-22 MED ORDER — SODIUM CHLORIDE 0.9 % IV BOLUS
250.0000 mL | Freq: Once | INTRAVENOUS | Status: AC
  Administered 2019-08-22: 250 mL via INTRAVENOUS

## 2019-08-22 MED ORDER — HEPARIN BOLUS VIA INFUSION
3000.0000 [IU] | Freq: Once | INTRAVENOUS | Status: AC
  Administered 2019-08-22: 3000 [IU] via INTRAVENOUS

## 2019-08-22 MED ORDER — MORPHINE SULFATE (PF) 2 MG/ML IV SOLN
2.0000 mg | INTRAVENOUS | Status: DC | PRN
  Administered 2019-08-22: 2 mg via INTRAVENOUS
  Filled 2019-08-22: qty 1

## 2019-08-22 MED ORDER — PANCRELIPASE (LIP-PROT-AMYL) 12000-38000 UNITS PO CPEP
36000.0000 [IU] | ORAL_CAPSULE | Freq: Three times a day (TID) | ORAL | Status: DC
  Administered 2019-08-24: 36000 [IU] via ORAL
  Filled 2019-08-22: qty 3

## 2019-08-22 MED ORDER — CLOPIDOGREL BISULFATE 75 MG PO TABS
75.0000 mg | ORAL_TABLET | Freq: Every day | ORAL | Status: DC
  Administered 2019-08-23: 75 mg via ORAL
  Filled 2019-08-22: qty 1

## 2019-08-22 MED ORDER — ONDANSETRON HCL 4 MG/2ML IJ SOLN: 4.0000 mg | Freq: Four times a day (QID) | INTRAMUSCULAR | Status: DC | PRN

## 2019-08-22 MED ORDER — HEPARIN (PORCINE) 25000 UT/250ML-% IV SOLN
850.0000 [IU]/h | INTRAVENOUS | Status: DC
  Administered 2019-08-22: 750 [IU]/h via INTRAVENOUS
  Filled 2019-08-22: qty 250

## 2019-08-22 NOTE — ED Provider Notes (Signed)
[facility] EMERGENCY DEPARTMENT Provider Note   CSN: [id number] Arrival date & time: 08/22/19  1312     History Chief Complaint  Patient presents with  . Chest Pain    Manuel Olson is a 73 y.o. male.  HPI   Pt is a 73 y/o male with a h/o CAD s/p multiple stents, HTN, HLD, MI, aortic stenosis s/p TAVR, renal cell carcinoma s/p left nephrectomy, pancreatic cancer s/p whipple, DM, who presents to the ED today for eval of chest pain. Pt states that he was working on a pickup truck PTA when he developed chest pain around 12pm. Reports pain to the left side of the chest that radiates to the LUE. He had associated tingling to the LUE. Pain feels like a pressure. Pain initially rated 3-4/10. He received 342m ASA and 1 ntg PTA. Pain now rated 1-2/10.  He reports associated lightheadedness, SOB. Denies associated nausea, diaphoresis.   Cardiologist: Dr. MDomenic Polite Past Medical History:  Diagnosis Date  . Arthritis   . CAD (coronary artery disease)    [place] DES to mid LAD, DES to diagonal, DES x 2 to proximal and mid RCA   . Essential hypertension   . Full dentures   . GERD (gastroesophageal reflux disease)   . History of stroke   . Hyperlipidemia   . Myocardial infarction ([place]   . Pancreatic cancer ([place]   . Pneumonia   . Renal cell carcinoma ([place]   . S/P TAVR (transcatheter aortic valve replacement)    a. 08/2017:  Edwards Sapien 3 THV (size 26 mm, model #9600CM26A , serial # [id number]  . Severe aortic stenosis    a. 08/2017: s/p TAVR by Dr. MAngelena Formand Dr. BCyndia Bent   . Type 2 diabetes mellitus ([place]   . Wears glasses   . Wears hearing aid     Patient Active Problem List   Diagnosis Date Noted  . Chest pain 08/22/2019  . Protein-calorie malnutrition, severe 04/05/2018  . Debility 02/27/2018  . Port-A-Cath in place 10/10/2017  . Renal cell carcinoma of right kidney ([facility] s/p nephrectomy 02/2018 10/02/2017  . Ileitis, terminal ([place] 09/28/2017   . Poorly controlled type 2 diabetes mellitus with circulatory disorder ([place] 08/25/2017  . Mixed hyperlipidemia 08/25/2017  . Essential hypertension, benign   . CAD (coronary artery disease)   . Former smoker   . Severe AS S/P TAVR  08/22/2017  . Cancer of head of pancreas ([facility] s/p Whipple 02/2018 08/10/2017    Past Surgical History:  Procedure Laterality Date  . APPENDECTOMY    . CARDIAC CATHETERIZATION    . CORONARY STENT PLACEMENT     x 4 in March 2018  . EUS N/A 08/02/2017   Procedure: UPPER ENDOSCOPIC ULTRASOUND (EUS) RADIAL;  Surgeon: JMilus Banister MD;  [facility];  Service: Endoscopy;  Laterality: N/A;  . EYE SURGERY Left   . HAND SURGERY Bilateral   . HERNIA REPAIR Right   . LAPAROSCOPY N/A 02/19/2018   Procedure: DIAGNOSTIC LAPAROSCOPY, ERAS PATHWAY;  Surgeon: BStark Klein MD;  [place]  Service: General;  Laterality: N/A;  . MULTIPLE TOOTH EXTRACTIONS    . NEPHRECTOMY Left 02/19/2018   Procedure: OPEN LEFT RADICAL NEPHRECTOMY;  Surgeon: MCleon Gustin MD;  [place]  Service: Urology;  Laterality: Left;  . PORT-A-CATH REMOVAL N/A 06/13/2019   Procedure: PORT REMOVAL;  Surgeon: BStark Klein MD;  [place]  Service: General;  Laterality: N/A;  .  PORTACATH PLACEMENT N/A 09/15/2017   Procedure: INSERTION PORT-A-CATH;  Surgeon: [name], MD;  Location: [place];  Service: General;  Laterality: N/A;  . RIGHT/LEFT HEART CATH AND CORONARY ANGIOGRAPHY N/A 07/05/2017   Procedure: RIGHT/LEFT HEART CATH AND CORONARY ANGIOGRAPHY;  Surgeon: [name], MD;  Location: [place] [facility];  Service: Cardiovascular;  Laterality: N/A;  . SHOULDER ARTHROSCOPY WITH ROTATOR CUFF REPAIR Left   . TEE WITHOUT CARDIOVERSION N/A 08/22/2017   Procedure: TRANSESOPHAGEAL ECHOCARDIOGRAM (TEE);  Surgeon: Burnell Blanks, MD;  Location: [facility];  Service: Open Heart Surgery;  Laterality: N/A;  . TRANSCATHETER AORTIC VALVE  REPLACEMENT, TRANSFEMORAL N/A 08/22/2017   Procedure: TRANSCATHETER AORTIC VALVE REPLACEMENT, TRANSFEMORAL;  Surgeon: Burnell Blanks, MD;  Location: [place];  Service: Open Heart Surgery;  Laterality: N/A;  . WHIPPLE PROCEDURE N/A 02/19/2018   Procedure: WHIPPLE PROCEDURE;  Surgeon: [name], MD;  [facility];  Service: General;  Laterality: N/A;       Family History  Problem Relation Age of Onset  . Lupus Mother   . CAD Brother   . Hypertension Brother     Social History   Tobacco Use  . Smoking status: Former Smoker    Packs/day: 1.00    Years: 57.00    Pack years: 57.00    Types: Cigarettes    Start date: 09/12/1958    Quit date: 04/12/2016    Years since quitting: 3.3  . Smokeless tobacco: Never Used  Substance Use Topics  . Alcohol use: No  . Drug use: No    Home Medications Prior to Admission medications   Medication Sig Start Date End Date Taking? Authorizing Provider  acetaminophen (TYLENOL) 325 MG tablet Take 650 mg by mouth every 6 (six) hours as needed (for pain.).   Yes [provider]  atorvastatin (LIPITOR) 20 MG tablet TAKE 1 TABLET BY MOUTH ONCE DAILY. 06/27/19  Yes Satira Sark, MD  clopidogrel (PLAVIX) 75 MG tablet TAKE 1 TABLET BY MOUTH ONCE DAILY. Patient taking differently: Take 75 mg by mouth daily.  11/01/18  Yes Satira Sark, MD  CREON 36000 units CPEP capsule TAKE 1 CAPSULE BY MOUTH BEFORE EACH MEAL AND SNACK. TAKE UP TO 6 CAPSULES PER DAY. Patient taking differently: Take 36,000 Units by mouth See admin instructions. Take 1 capsule before each meal and with snacks. 04/09/19  Yes Ladell Pier, MD  Insulin Glargine (BASAGLAR KWIKPEN) 100 UNIT/ML SOPN Inject 0.1 mLs (10 Units total) into the skin at bedtime. Patient taking differently: Inject 9 Units into the skin daily.  01/24/19  Yes Philemon Kingdom, MD  insulin lispro (HUMALOG KWIKPEN) 100 UNIT/ML KwikPen Inject 3-5 units 3 times a day with meals. Patient taking  differently: Inject 5 Units into the skin 3 (three) times daily before meals. Inject 3-5 units 3 times a day with meals. 01/24/19  Yes Philemon Kingdom, MD  losartan (COZAAR) 25 MG tablet Take 0.5 tablets (12.5 mg total) by mouth daily. 08/12/19 11/10/19 Yes Satira Sark, MD  Insulin Pen Needle (PEN NEEDLES) 31G X 6 MM MISC Use as directed to check blood sugar 4 times a day. 11/02/18   Philemon Kingdom, MD  oxyCODONE (OXY IR/ROXICODONE) 5 MG immediate release tablet Take 1 tablet (5 mg total) by mouth every 6 (six) hours as needed for severe pain. 06/13/19   [name], MD    Allergies    Patient has no known allergies.  Review of Systems   Review of  Systems  Constitutional: Negative for fever.  HENT: Negative for ear pain and sore throat.   Eyes: Negative for visual disturbance.  Respiratory: Positive for shortness of breath. Negative for cough.   Cardiovascular: Positive for chest pain. Negative for leg swelling.  Gastrointestinal: Negative for abdominal pain, constipation, diarrhea, nausea and vomiting.  Genitourinary: Negative for dysuria and hematuria.  Musculoskeletal: Negative for back pain.  Skin: Negative for rash.  Neurological: Negative for headaches.  All other systems reviewed and are negative.   Physical Exam Updated Vital Signs BP (!) 189/79 (BP Location: Left Arm)   Pulse (!) 56   Temp 97.8 F (36.6 C) (Oral)   Resp 17   Ht 5' 9"  (1.753 m)   Wt 62 kg   SpO2 100%   BMI 20.18 kg/m   Physical Exam Vitals and nursing note reviewed.  Constitutional:      Appearance: He is well-developed.  HENT:     Head: Normocephalic and atraumatic.  Eyes:     Conjunctiva/sclera: Conjunctivae normal.  Cardiovascular:     Rate and Rhythm: Normal rate and regular rhythm.     Heart sounds: No murmur.  Pulmonary:     Effort: Pulmonary effort is normal. No respiratory distress.     Breath sounds: Normal breath sounds. No decreased breath sounds, rhonchi or rales.   Chest:     Chest wall: Tenderness (mild to left lateral pec and axilla (this does not reproduce his prior pain)) present.  Abdominal:     Palpations: Abdomen is soft.     Tenderness: There is no abdominal tenderness. There is no guarding or rebound.  Musculoskeletal:     Cervical back: Neck supple.     Right lower leg: No edema.     Left lower leg: No edema.  Skin:    General: Skin is warm and dry.  Neurological:     Mental Status: He is alert.     ED Results / Procedures / Treatments   Labs (all labs ordered are listed, but only abnormal results are displayed) Labs Reviewed  CBC WITH DIFFERENTIAL/PLATELET - Abnormal; Notable for the following components:      Result Value   RBC 3.70 (*)    Hemoglobin 11.5 (*)    HCT 37.2 (*)    MCV 100.5 (*)    Platelets 133 (*)    All other components within normal limits  COMPREHENSIVE METABOLIC PANEL - Abnormal; Notable for the following components:   Glucose, Bld 132 (*)    BUN 28 (*)    Creatinine, Ser 1.48 (*)    GFR calc non Af Amer 46 (*)    GFR calc Af Amer 53 (*)    All other components within normal limits  TROPONIN I (HIGH SENSITIVITY) - Abnormal; Notable for the following components:   Troponin I (High Sensitivity) 34 (*)    All other components within normal limits  SARS CORONAVIRUS 2 (TAT 6-24 HRS)  TROPONIN I (HIGH SENSITIVITY)    EKG EKG Interpretation  Date/Time:  Thursday August 22 2019 13:26:41 EST Ventricular Rate:  61 PR Interval:    QRS Duration: 89 QT Interval:  438 QTC Calculation: 442 R Axis:   79 Text Interpretation: Sinus rhythm Anterior infarct, old Poor baseline Nonspecific repol abnormality, diffuse leads Reconfirmed by Elnora Morrison [phone]) on 08/22/2019 2:23:03 PM   Radiology DG Chest Portable 1 View  Result Date: 08/22/2019 CLINICAL DATA:  Chest pain since noon today, LEFT side chest pressure radiating down LEFT arm,  history diabetes mellitus, renal cell carcinoma, hypertension, former  smoker, coronary artery disease, pancreatitis EXAM: PORTABLE CHEST 1 VIEW COMPARISON:  Portable exam 1352 hours compared to 01/14/2019 FINDINGS: Normal heart size post TAVR. Mediastinal contours and pulmonary vascularity normal. Atherosclerotic calcification aorta. Emphysematous and bronchitic changes consistent with COPD. Two nodular foci are identified at the lateral RIGHT chest on one view, with only a single nodule seen on the second view unchanged since 2019 likely a calcified granuloma; second nodule likely reflects a nipple shadow. Lungs clear. No pulmonary infiltrate, pleural effusion, or pneumothorax. Bones demineralized with scattered degenerative changes of the thoracic spine. IMPRESSION: COPD changes with stable nodular density at the lateral mid RIGHT chest. Probable RIGHT nipple shadow seen on single view but not seen on second. No acute abnormalities. Electronically Signed   By: Lavonia Dana M.D.   On: 08/22/2019 14:05    Procedures Procedures (including critical care time)  Medications Ordered in ED Medications - No data to display  ED Course  I have reviewed the triage vital signs and the nursing notes.  Pertinent labs & imaging results that were available during my care of the patient were reviewed by me and considered in my medical decision making (see chart for details).    MDM Rules/Calculators/A&P       Final Clinical Impression(s) / ED Diagnoses Final diagnoses:  Chest pain, unspecified type   73 year old male with history of CAD status post post multiple stents, aortic stenosis s/p TAVR, who presents the emergency department today with left-sided chest pain that occurred while exerting himself while fixing a car prior to arrival.  Reported associated shortness of breath and lightheadedness.  Pain radiated to the left upper extremity.    On arrival patient was hypertensive but blood pressure improved.  He received nitroglycerin and aspirin in route.  Review of his vital  signs are reassuring.  His exam is reassuring.  CBC at baseline CMP at baseline Trop elevated at 34  EKG with NSR, Anterior infarct, old Poor baseline Nonspecific repol abnormality, diffuse leads  Chest x-ray with COPD changes with stable nodular density at the lateral mid RIGHT chest. Probable RIGHT nipple shadow seen on single view but not seen on second. No acute abnormalities.  4:18 PM CONSULT With Dr. Bronson Ing from cardiology who recommends admission. States that patient does not need to be transferred to [facility].  5:39 PM CONSULT with Dr. Denton Brick with hospitalist service who accepts patient for admission.   Rx / DC Orders ED Discharge Orders    None       Bishop Dublin 08/22/19 1742    Elnora Morrison, MD 08/25/19 0800

## 2019-08-22 NOTE — ED Triage Notes (Signed)
Ems reports pt started having chest pain around 12 noon today.  Reports pressure left side of chest radiating down left arm.  Rated at 4/10 initially.  Pt has had 389m asa and 1 nitro pta.  EMS started 18g iv in r ac.  Pt has history of mi and [place] 4 cardiac [place]  CBG 150.  Pt says pain/pressure are almost gone.  Rates pain at 1/10.

## 2019-08-22 NOTE — Telephone Encounter (Signed)
I will FYI Dr.McDowell 

## 2019-08-22 NOTE — Progress Notes (Signed)
ANTICOAGULATION CONSULT NOTE - Initial Consult  Pharmacy Consult for heparin Indication: chest pain/ACS  No Known Allergies  Patient Measurements: Height: 5' 9"  (175.3 cm) Weight: 136 lb 11 oz (62 kg) IBW/kg (Calculated) : 70.7 Heparin Dosing Weight: 62 kg  Vital Signs: Temp: 97.8 F (36.6 C) (12/10 1323) Temp Source: Oral (12/10 1323) BP: 200/80 (12/10 2011) Pulse Rate: 59 (12/10 1830)  Labs: Recent Labs    08/22/19 1513 08/22/19 1707  HGB 11.5*  --   HCT 37.2*  --   PLT 133*  --   CREATININE 1.48*  --   TROPONINIHS 34* 121*    Estimated Creatinine Clearance: 37.8 mL/min (A) (by C-G formula based on SCr of 1.48 mg/dL (H)).   Medical History: Past Medical History:  Diagnosis Date  . Arthritis   . CAD (coronary artery disease)    a. 11/2016 in [place]: STEMI s/p DES to mid LAD, DES to diagonal, DES x 2 to proximal and mid RCA   . Essential hypertension   . Full dentures   . GERD (gastroesophageal reflux disease)   . History of stroke   . Hyperlipidemia   . Myocardial infarction ([place])   . Pancreatic cancer ([place])   . Pneumonia   . Renal cell carcinoma ([place])   . S/P TAVR (transcatheter aortic valve replacement)    a. 08/2017:  Edwards Sapien 3 THV (size 26 mm, model #9600CM26A , serial # [id number])  . Severe aortic stenosis    a. 08/2017: s/p TAVR by Dr. Angelena Form and Dr. Cyndia Bent.   . Type 2 diabetes mellitus ([place])   . Wears glasses   . Wears hearing aid     Medications:  See medication history  Assessment: 73 yo man to start heparin for r/o ACS.  He was not on anticoagulation PTA Goal of Therapy:  Heparin level 0.3-0.7 units/ml Monitor platelets by anticoagulation protocol: Yes   Plan:  Heparin bolus 3000 units and drip at 750 units/hr Check heparin level in ~ 6 hours Daily HL and CBC while on heparin Monitor for bleeding complications  Thanks for allowing pharmacy to be a part of this patient's care.  Excell Seltzer, PharmD Clinical  Pharmacist 08/22/2019,8:18 PM

## 2019-08-22 NOTE — Telephone Encounter (Signed)
Pt's wife walked into the office and wanted Dr. Domenic Polite to be made aware that pt is currently in the ER being seen for chest pain.

## 2019-08-22 NOTE — H&P (Addendum)
History and Physical    ELO MARMOLEJOS ATF:[id number] DOB: 19-Jan-1946 DOA: 08/22/2019  PCP: [name], MD   Patient coming from: Home.   I have personally briefly reviewed patient's old medical records in [place]  Chief Complaint: Chest Pain  HPI: Manuel Olson is a 73 y.o. male with medical history significant for coronary artery disease with history of PCI, severe aortic stenosis status post TAVR, diabetes mellitus, renal and pancreatic cancer, hypertension. Patient presented to the ED with complaints of sudden onset of left upper chest/left shoulder pain that started while he was working on a truck, he had just lifted a gas tank, after he put this down the pain started.  He describes it as pressure-like and similar to his chest pain he had in [place] where he had a heart attack and subsequently stents were placed.  No associated difficulty breathing, nausea vomiting or palpitations. At the time of my evaluation chest pain has resolved without intervention, he is able to move his left shoulder without limitation.    ED Course: Systolic blood pressure 270W to 190s, O2 sats greater than 95% on room air. Hs troponin 34.  EKG sinus rhythm without significant change from prior.  Portable chest x-ray without acute abnormality. EDP talked to cardiology, who recommended admission overnight at [facility].  Review of Systems: As per HPI all other systems reviewed and negative.  Past Medical History:  Diagnosis Date  . Arthritis   . CAD (coronary artery disease)    a. 11/2016 in [place]: STEMI s/p DES to mid LAD, DES to diagonal, DES x 2 to proximal and mid RCA   . Essential hypertension   . Full dentures   . GERD (gastroesophageal reflux disease)   . History of stroke   . Hyperlipidemia   . Myocardial infarction ([place])   . Pancreatic cancer ([place])   . Pneumonia   . Renal cell carcinoma ([place])   . S/P TAVR (transcatheter aortic valve replacement)    a. 08/2017:   Edwards Sapien 3 THV (size 26 mm, model #9600CM26A , serial # [id number])  . Severe aortic stenosis    a. 08/2017: s/p TAVR by Dr. Angelena Form and Dr. Cyndia Bent.   . Type 2 diabetes mellitus ([place])   . Wears glasses   . Wears hearing aid     Past Surgical History:  Procedure Laterality Date  . APPENDECTOMY    . CARDIAC CATHETERIZATION    . CORONARY STENT PLACEMENT     x 4 in March 2018  . EUS N/A 08/02/2017   Procedure: UPPER ENDOSCOPIC ULTRASOUND (EUS) RADIAL;  Surgeon: [name], MD;  Location: [facility];  Service: Endoscopy;  Laterality: N/A;  . EYE SURGERY Left   . HAND SURGERY Bilateral   . HERNIA REPAIR Right   . LAPAROSCOPY N/A 02/19/2018   Procedure: DIAGNOSTIC LAPAROSCOPY, ERAS PATHWAY;  Surgeon: [name], MD;  Location: [place];  Service: General;  Laterality: N/A;  . MULTIPLE TOOTH EXTRACTIONS    . NEPHRECTOMY Left 02/19/2018   Procedure: OPEN LEFT RADICAL NEPHRECTOMY;  Surgeon: Cleon Gustin, MD;  Location: [place];  Service: Urology;  Laterality: Left;  . PORT-A-CATH REMOVAL N/A 06/13/2019   Procedure: PORT REMOVAL;  Surgeon: [name], MD;  Location: [place];  Service: General;  Laterality: N/A;  . PORTACATH PLACEMENT N/A 09/15/2017   Procedure: INSERTION PORT-A-CATH;  Surgeon: [name], MD;  Location: [place];  Service: General;  Laterality: N/A;  .  RIGHT/LEFT HEART CATH AND CORONARY ANGIOGRAPHY N/A 07/05/2017   Procedure: RIGHT/LEFT HEART CATH AND CORONARY ANGIOGRAPHY;  Surgeon: [name], MD;  Location: [place] [facility];  Service: Cardiovascular;  Laterality: N/A;  . SHOULDER ARTHROSCOPY WITH ROTATOR CUFF REPAIR Left   . TEE WITHOUT CARDIOVERSION N/A 08/22/2017   Procedure: TRANSESOPHAGEAL ECHOCARDIOGRAM (TEE);  Surgeon: Burnell Blanks, MD;  Location: [place];  Service: Open Heart Surgery;  Laterality: N/A;  . TRANSCATHETER AORTIC VALVE REPLACEMENT, TRANSFEMORAL N/A 08/22/2017   Procedure: TRANSCATHETER AORTIC VALVE  REPLACEMENT, TRANSFEMORAL;  Surgeon: Burnell Blanks, MD;  Location: [place];  Service: Open Heart Surgery;  Laterality: N/A;  . WHIPPLE PROCEDURE N/A 02/19/2018   Procedure: WHIPPLE PROCEDURE;  Surgeon: [name], MD;  Location: [place];  Service: General;  Laterality: N/A;     reports that he quit smoking about 3 years ago. His smoking use included cigarettes. He started smoking about 60 years ago. He has a 57.00 pack-year smoking history. He has never used smokeless tobacco. He reports that he does not drink alcohol or use drugs.  No Known Allergies  Family History  Problem Relation Age of Onset  . Lupus Mother   . CAD Brother   . Hypertension Brother     Prior to Admission medications   Medication Sig Start Date End Date Taking? Authorizing Provider  acetaminophen (TYLENOL) 325 MG tablet Take 650 mg by mouth every 6 (six) hours as needed (for pain.).   Yes [provider]  atorvastatin (LIPITOR) 20 MG tablet TAKE 1 TABLET BY MOUTH ONCE DAILY. 06/27/19  Yes Satira Sark, MD  clopidogrel (PLAVIX) 75 MG tablet TAKE 1 TABLET BY MOUTH ONCE DAILY. Patient taking differently: Take 75 mg by mouth daily.  11/01/18  Yes Satira Sark, MD  CREON 36000 units CPEP capsule TAKE 1 CAPSULE BY MOUTH BEFORE EACH MEAL AND SNACK. TAKE UP TO 6 CAPSULES PER DAY. Patient taking differently: Take 36,000 Units by mouth See admin instructions. Take 1 capsule before each meal and with snacks. 04/09/19  Yes Ladell Pier, MD  Insulin Glargine (BASAGLAR KWIKPEN) 100 UNIT/ML SOPN Inject 0.1 mLs (10 Units total) into the skin at bedtime. Patient taking differently: Inject 9 Units into the skin daily.  01/24/19  Yes Philemon Kingdom, MD  insulin lispro (HUMALOG KWIKPEN) 100 UNIT/ML KwikPen Inject 3-5 units 3 times a day with meals. Patient taking differently: Inject 5 Units into the skin 3 (three) times daily before meals. Inject 3-5 units 3 times a day with meals. 01/24/19  Yes Philemon Kingdom, MD  losartan (COZAAR) 25 MG tablet Take 0.5 tablets (12.5 mg total) by mouth daily. 08/12/19 11/10/19 Yes Satira Sark, MD  Insulin Pen Needle (PEN NEEDLES) 31G X 6 MM MISC Use as directed to check blood sugar 4 times a day. 11/02/18   Philemon Kingdom, MD  oxyCODONE (OXY IR/ROXICODONE) 5 MG immediate release tablet Take 1 tablet (5 mg total) by mouth every 6 (six) hours as needed for severe pain. 06/13/19   [name], MD    Physical Exam: Vitals:   08/22/19 1430 08/22/19 1500 08/22/19 1630 08/22/19 1719  BP: (!) 182/82 (!) 171/73  (!) 189/79  Pulse: (!) 58 (!) 54 (!) 56 (!) 56  Resp: 13 13 16 17   Temp:      TempSrc:      SpO2: 99% 100% 100% 100%  Weight:      Height:        Constitutional: NAD, calm,  comfortable Vitals:   08/22/19 1430 08/22/19 1500 08/22/19 1630 08/22/19 1719  BP: (!) 182/82 (!) 171/73  (!) 189/79  Pulse: (!) 58 (!) 54 (!) 56 (!) 56  Resp: 13 13 16 17   Temp:      TempSrc:      SpO2: 99% 100% 100% 100%  Weight:      Height:       Eyes: PERRL, lids and conjunctivae normal ENMT: Mucous membranes are moist. Posterior pharynx clear of any exudate or lesions.  Neck: normal, supple, no masses, no thyromegaly Respiratory: clear to auscultation bilaterally, no wheezing, no crackles. Normal respiratory effort. No accessory muscle use.  Cardiovascular: Regular rate and rhythm, no murmurs / rubs / gallops. No extremity edema. 2+ pedal pulses.   Abdomen: no tenderness, no masses palpated. No hepatosplenomegaly. Bowel sounds positive.  Musculoskeletal: no clubbing / cyanosis.  Full range of motion left shoulder, without pain or tenderness, no joint deformity upper and lower extremities. Good ROM, no contractures. Normal muscle tone.  Skin: no rashes, lesions, ulcers. No induration Neurologic: CN 2-12 grossly intact.  Strength 5/5 in all 4.  Psychiatric: Normal judgment and insight. Alert and oriented x 3. Normal mood.   Labs on Admission: I have  personally reviewed following labs and imaging studies  CBC: Recent Labs  Lab 08/22/19 1513  WBC 7.4  NEUTROABS 5.6  HGB 11.5*  HCT 37.2*  MCV 100.5*  PLT 923*   Basic Metabolic Panel: Recent Labs  Lab 08/22/19 1513  NA 136  K 4.2  CL 102  CO2 25  GLUCOSE 132*  BUN 28*  CREATININE 1.48*  CALCIUM 9.0   Liver Function Tests: Recent Labs  Lab 08/22/19 1513  AST 27  ALT 30  ALKPHOS 100  BILITOT 0.4  PROT 6.9  ALBUMIN 4.0    Radiological Exams on Admission: DG Chest Portable 1 View  Result Date: 08/22/2019 CLINICAL DATA:  Chest pain since noon today, LEFT side chest pressure radiating down LEFT arm, history diabetes mellitus, renal cell carcinoma, hypertension, former smoker, coronary artery disease, pancreatitis EXAM: PORTABLE CHEST 1 VIEW COMPARISON:  Portable exam 1352 hours compared to 01/14/2019 FINDINGS: Normal heart size post TAVR. Mediastinal contours and pulmonary vascularity normal. Atherosclerotic calcification aorta. Emphysematous and bronchitic changes consistent with COPD. Two nodular foci are identified at the lateral RIGHT chest on one view, with only a single nodule seen on the second view unchanged since 2019 likely a calcified granuloma; second nodule likely reflects a nipple shadow. Lungs clear. No pulmonary infiltrate, pleural effusion, or pneumothorax. Bones demineralized with scattered degenerative changes of the thoracic spine. IMPRESSION: COPD changes with stable nodular density at the lateral mid RIGHT chest. Probable RIGHT nipple shadow seen on single view but not seen on second. No acute abnormalities. Electronically Signed   By: Lavonia Dana M.D.   On: 08/22/2019 14:05    EKG: Independently reviewed.  Sinus rhythm.  QTc 442.  Large T waves V2- V5, no significant change from prior.  Assessment/Plan Active Problems:   Chest pain   Chest pain with history of coronary artery disease with PCI- typical chest pain, Multivessel CAD status post DES to  the LAD, DES to the diagonal, and DES x2 to the RCA in 2018.  EKG unchanged, Hs Troponin initially 34 >> 121.  Last echo 11/24//2020, EF 50%, with grade 1 diastolic dysfunction, LV global hypokinesis. - Initially ED provider talked to cardiology, plans were to admit patient here.  But with increase in troponin,  I reconsulted cardiology at [facility], talked with Dr. Margaretann Loveless, agreed with transfer to [facility] and initiating heparin GTT. -Care order instruction to page cardiology on arrival to [facility]. -Trend troponin -EKG a.m. -Resume home Plavix, statins.  Chronic mild thrombocytopenia-platelets 133, at baseline. -Hemoglobin stable, initiate heparin GTT.  CKD 3-stable, creatinine 1.4, at baseline 1.4-1.6.  History of Severe aortic stenosis s/p TAVR- 2018, follows with cardiology, Dr. Domenic Polite.  Controlled diabetes mellitus-random glucose 132.  Hemoglobin A1c 05/14/2019 7.3. -Sliding scale insulin -Resume home Lantus at reduced dose 5 units nightly  Hypertension-elevated. 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   DVT prophylaxis: Heparin GTT Code Status: Full code Family Communication: None at bedside Disposition Plan: Per rounding team Consults called: Cardiology Admission status: Observation, telemetry   [place] MD [facility]  08/22/2019, 8:19 PM

## 2019-08-22 NOTE — ED Notes (Signed)
CRITICAL VALUE ALERT  Critical Value: Troponin 665   Date & Time Notied:  08/22/19 at 2125  Provider Notified: Dr Arlyce Dice  Orders Received/Actions taken: No orders at this time

## 2019-08-22 NOTE — Telephone Encounter (Signed)
Thank you for letting me know.  Please communicate this to the team covering Manuel Olson today since I am in [place].

## 2019-08-23 ENCOUNTER — Other Ambulatory Visit: Payer: Self-pay

## 2019-08-23 ENCOUNTER — Inpatient Hospital Stay (HOSPITAL_COMMUNITY)
Admission: EM | Disposition: A | Discharge status: Home / Self Care | Payer: Self-pay | Source: Home / Self Care | Attending: Internal Medicine

## 2019-08-23 ENCOUNTER — Encounter: Payer: Medicare Other | Admitting: Family Medicine

## 2019-08-23 ENCOUNTER — Ambulatory Visit: Payer: Medicare Other

## 2019-08-23 DIAGNOSIS — I10 Essential (primary) hypertension: Secondary | ICD-10-CM

## 2019-08-23 DIAGNOSIS — I214 Non-ST elevation (NSTEMI) myocardial infarction: Secondary | ICD-10-CM

## 2019-08-23 DIAGNOSIS — I25119 Atherosclerotic heart disease of native coronary artery with unspecified angina pectoris: Secondary | ICD-10-CM

## 2019-08-23 DIAGNOSIS — N1831 Chronic kidney disease, stage 3a: Secondary | ICD-10-CM

## 2019-08-23 DIAGNOSIS — I200 Unstable angina: Secondary | ICD-10-CM

## 2019-08-23 HISTORY — PX: CORONARY STENT INTERVENTION: CATH118234

## 2019-08-23 HISTORY — PX: CORONARY ANGIOGRAPHY: CATH118303

## 2019-08-23 LAB — GLUCOSE, CAPILLARY
Glucose-Capillary: 104 mg/dL — ABNORMAL HIGH (ref 70–99)
Glucose-Capillary: 133 mg/dL — ABNORMAL HIGH (ref 70–99)

## 2019-08-23 LAB — POCT ACTIVATED CLOTTING TIME
Activated Clotting Time: 433 seconds
Activated Clotting Time: 324 seconds
Activated Clotting Time: 406 seconds

## 2019-08-23 LAB — HEPARIN LEVEL (UNFRACTIONATED): Heparin Unfractionated: 0.29 IU/mL — ABNORMAL LOW (ref 0.30–0.70)

## 2019-08-23 LAB — SARS CORONAVIRUS 2 (TAT 6-24 HRS): SARS Coronavirus 2: NEGATIVE

## 2019-08-23 SURGERY — CORONARY STENT INTERVENTION
Anesthesia: LOCAL

## 2019-08-23 MED ORDER — CLOPIDOGREL BISULFATE 300 MG PO TABS
ORAL_TABLET | ORAL | Status: DC | PRN
  Administered 2019-08-23: 300 mg via ORAL

## 2019-08-23 MED ORDER — LABETALOL HCL 5 MG/ML IV SOLN: 10.0000 mg | INTRAVENOUS | Status: AC | PRN

## 2019-08-23 MED ORDER — FENTANYL CITRATE (PF) 100 MCG/2ML IJ SOLN
INTRAMUSCULAR | Status: DC | PRN
  Administered 2019-08-23 (×3): 25 ug via INTRAVENOUS

## 2019-08-23 MED ORDER — HYDRALAZINE HCL 20 MG/ML IJ SOLN: 10.0000 mg | INTRAMUSCULAR | Status: AC | PRN

## 2019-08-23 MED ORDER — NITROGLYCERIN 1 MG/10 ML FOR IR/CATH LAB
INTRA_ARTERIAL | Status: AC
  Filled 2019-08-23: qty 10

## 2019-08-23 MED ORDER — SODIUM CHLORIDE 0.9 % WEIGHT BASED INFUSION
1.0000 mL/kg/h | INTRAVENOUS | Status: AC
  Administered 2019-08-23: 1 mL/kg/h via INTRAVENOUS

## 2019-08-23 MED ORDER — SODIUM CHLORIDE 0.9 % IV SOLN
INTRAVENOUS | Status: DC
  Administered 2019-08-23: 11:00:00 via INTRAVENOUS

## 2019-08-23 MED ORDER — IOHEXOL 350 MG/ML SOLN
INTRAVENOUS | Status: DC | PRN
  Administered 2019-08-23: 245 mL

## 2019-08-23 MED ORDER — HEPARIN (PORCINE) IN NACL 1000-0.9 UT/500ML-% IV SOLN
INTRAVENOUS | Status: DC | PRN
  Administered 2019-08-23 (×2): 500 mL

## 2019-08-23 MED ORDER — ACETAMINOPHEN 325 MG PO TABS: 650.0000 mg | ORAL_TABLET | ORAL | Status: DC | PRN

## 2019-08-23 MED ORDER — ASPIRIN 81 MG PO CHEW
81.0000 mg | CHEWABLE_TABLET | Freq: Every day | ORAL | Status: DC
  Administered 2019-08-24: 81 mg via ORAL
  Filled 2019-08-23: qty 1

## 2019-08-23 MED ORDER — SODIUM CHLORIDE 0.9 % IV SOLN: 250.0000 mL | INTRAVENOUS | Status: DC | PRN

## 2019-08-23 MED ORDER — CLOPIDOGREL BISULFATE 75 MG PO TABS
75.0000 mg | ORAL_TABLET | Freq: Every day | ORAL | Status: DC
  Administered 2019-08-24: 75 mg via ORAL
  Filled 2019-08-23: qty 1

## 2019-08-23 MED ORDER — MIDAZOLAM HCL 2 MG/2ML IJ SOLN
INTRAMUSCULAR | Status: DC | PRN
  Administered 2019-08-23: 1 mg via INTRAVENOUS

## 2019-08-23 MED ORDER — OXYCODONE HCL 5 MG PO TABS: 5.0000 mg | ORAL_TABLET | ORAL | Status: DC | PRN

## 2019-08-23 MED ORDER — ONDANSETRON HCL 4 MG/2ML IJ SOLN: 4.0000 mg | Freq: Four times a day (QID) | INTRAMUSCULAR | Status: DC | PRN

## 2019-08-23 MED ORDER — ASPIRIN EC 81 MG PO TBEC: 81.0000 mg | DELAYED_RELEASE_TABLET | Freq: Every day | ORAL | Status: DC

## 2019-08-23 MED ORDER — HEPARIN SODIUM (PORCINE) 1000 UNIT/ML IJ SOLN
INTRAMUSCULAR | Status: DC | PRN
  Administered 2019-08-23: 3000 [IU] via INTRAVENOUS
  Administered 2019-08-23: 7000 [IU] via INTRAVENOUS
  Administered 2019-08-23: 3000 [IU] via INTRAVENOUS

## 2019-08-23 MED ORDER — HEPARIN (PORCINE) IN NACL 1000-0.9 UT/500ML-% IV SOLN
INTRAVENOUS | Status: AC
  Filled 2019-08-23: qty 1000

## 2019-08-23 MED ORDER — CLOPIDOGREL BISULFATE 300 MG PO TABS
ORAL_TABLET | ORAL | Status: AC
  Filled 2019-08-23: qty 1

## 2019-08-23 MED ORDER — VERAPAMIL HCL 2.5 MG/ML IV SOLN
INTRAVENOUS | Status: AC
  Filled 2019-08-23: qty 2

## 2019-08-23 MED ORDER — HEPARIN SODIUM (PORCINE) 5000 UNIT/ML IJ SOLN
5000.0000 [IU] | Freq: Three times a day (TID) | INTRAMUSCULAR | Status: DC
  Administered 2019-08-24: 5000 [IU] via SUBCUTANEOUS
  Filled 2019-08-23: qty 1

## 2019-08-23 MED ORDER — HEPARIN SODIUM (PORCINE) 1000 UNIT/ML IJ SOLN
INTRAMUSCULAR | Status: AC
  Filled 2019-08-23: qty 1

## 2019-08-23 MED ORDER — ANGIOPLASTY BOOK
Freq: Once | Status: AC
  Administered 2019-08-23: 10:00:00
  Filled 2019-08-23: qty 1

## 2019-08-23 MED ORDER — LIDOCAINE HCL (PF) 1 % IJ SOLN
INTRAMUSCULAR | Status: DC | PRN
  Administered 2019-08-23: 2 mL

## 2019-08-23 MED ORDER — HEART ATTACK BOUNCING BOOK
Freq: Once | Status: AC
  Administered 2019-08-24: 06:00:00
  Filled 2019-08-23: qty 1

## 2019-08-23 MED ORDER — ASPIRIN 81 MG PO CHEW
81.0000 mg | CHEWABLE_TABLET | ORAL | Status: AC
  Administered 2019-08-23: 81 mg via ORAL
  Filled 2019-08-23: qty 1

## 2019-08-23 MED ORDER — MIDAZOLAM HCL 2 MG/2ML IJ SOLN
INTRAMUSCULAR | Status: AC
  Filled 2019-08-23: qty 2

## 2019-08-23 MED ORDER — LIDOCAINE HCL (PF) 1 % IJ SOLN
INTRAMUSCULAR | Status: AC
  Filled 2019-08-23: qty 30

## 2019-08-23 MED ORDER — SODIUM CHLORIDE 0.9% FLUSH: 3.0000 mL | INTRAVENOUS | Status: DC | PRN

## 2019-08-23 MED ORDER — VERAPAMIL HCL 2.5 MG/ML IV SOLN
INTRAVENOUS | Status: DC | PRN
  Administered 2019-08-23: 10 mL via INTRA_ARTERIAL

## 2019-08-23 MED ORDER — SODIUM CHLORIDE 0.9% FLUSH
3.0000 mL | Freq: Two times a day (BID) | INTRAVENOUS | Status: DC
  Administered 2019-08-24: 3 mL via INTRAVENOUS

## 2019-08-23 MED ORDER — ANGIOPLASTY BOOK
Freq: Once | Status: AC
  Administered 2019-08-24: 06:00:00
  Filled 2019-08-23: qty 1

## 2019-08-23 MED ORDER — SODIUM CHLORIDE 0.9% FLUSH: 3.0000 mL | Freq: Two times a day (BID) | INTRAVENOUS | Status: DC

## 2019-08-23 MED ORDER — FENTANYL CITRATE (PF) 100 MCG/2ML IJ SOLN
INTRAMUSCULAR | Status: AC
  Filled 2019-08-23: qty 2

## 2019-08-23 SURGICAL SUPPLY — 21 items
BALLN MINITREK OTW 2.0X12 (BALLOONS) ×2
BALLN SAPPHIRE 2.5X15 (BALLOONS) ×2
BALLN SAPPHIRE ~~LOC~~ 3.0X15 (BALLOONS) ×2
CATH 5FR JL3.5 JR4 ANG PIG MP (CATHETERS) ×2
CATH INFINITI 5FR JL4 (CATHETERS) ×2
CATH VISTA GUIDE 6FR JR4 (CATHETERS) ×2
DEVICE RAD COMP TR BAND LRG (VASCULAR PRODUCTS) ×2
ELECT DEFIB PAD ADLT CADENCE (PAD) ×2
GLIDESHEATH SLEND A-KIT 6F 22G (SHEATH) ×2
GUIDELINER 6F (CATHETERS) ×2
INQWIRE 1.5J .035X260CM (WIRE) ×2
KIT ENCORE 26 ADVANTAGE (KITS) ×2
KIT HEART LEFT (KITS) ×2
PACK CARDIAC CATHETERIZATION (CUSTOM PROCEDURE TRAY) ×2
SHEATH PROBE COVER 6X72 (BAG) ×2
STENT RESOLUTE ONYX 2.75X22 (Permanent Stent) ×2 IMPLANT
TRANSDUCER W/STOPCOCK (MISCELLANEOUS) ×2
TUBING CIL FLEX 10 FLL-RA (TUBING) ×2
WIRE ASAHI PROWATER 180CM (WIRE) ×4
WIRE COUGAR XT STRL 190CM (WIRE) ×2
WIRE RUNTHROUGH .014X300CM (WIRE) ×2

## 2019-08-23 NOTE — Progress Notes (Signed)
Progress Note  Patient Name: Manuel Olson Date of Encounter: 08/23/2019  Primary Cardiologist: Rozann Lesches, MD   Subjective   No chest pain this am.   Inpatient Medications    Scheduled Meds: . atorvastatin  20 mg Oral Daily  . clopidogrel  75 mg Oral Daily  . insulin glargine  9 Units Subcutaneous Daily  . lipase/protease/amylase  36,000 Units Oral TID WC  . losartan  12.5 mg Oral Daily   Continuous Infusions: . heparin 750 Units/hr (08/23/19 0600)  . nitroGLYCERIN 10 mcg/min (08/23/19 0600)   PRN Meds: acetaminophen **OR** acetaminophen, hydrALAZINE, morphine injection, nitroGLYCERIN, ondansetron **OR** ondansetron (ZOFRAN) IV, polyethylene glycol   Vital Signs    Vitals:   08/22/19 2314 08/22/19 2325 08/23/19 0000 08/23/19 0425  BP: (!) 110/94 111/61 113/68 (!) 156/76  Pulse: (!) 122   60  Resp: 18   18  Temp: 98.4 F (36.9 C)   97.6 F (36.4 C)  TempSrc: Oral   Oral  SpO2: 98%   97%  Weight: 61.3 kg   61.3 kg  Height: 5' 10"  (1.778 m)       Intake/Output Summary (Last 24 hours) at 08/23/2019 0912 Last data filed at 08/23/2019 0758 Gross per 24 hour  Intake 347.06 ml  Output 450 ml  Net -102.94 ml   Last 3 Weights 08/23/2019 08/22/2019 08/22/2019  Weight (lbs) 135 lb 2.3 oz 135 lb 2.3 oz 136 lb 11 oz  Weight (kg) 61.3 kg 61.3 kg 62 kg      Telemetry    Sinus - Personally Reviewed  ECG    NSR, no ischemic changes - Personally Reviewed  Physical Exam   GEN: No acute distress.   Neck: No JVD Cardiac: RRR, no murmurs, rubs, or gallops.  Respiratory: Clear to auscultation bilaterally. GI: Soft, nontender, non-distended  MS: No edema; No deformity. Neuro:  Nonfocal  Psych: Normal affect   Labs    High Sensitivity Troponin:   Recent Labs  Lab 08/22/19 1513 08/22/19 1707 08/22/19 2036  TROPONINIHS 34* 121* 665*      Chemistry Recent Labs  Lab 08/22/19 1513  NA 136  K 4.2  CL 102  CO2 25  GLUCOSE 132*  BUN 28*    CREATININE 1.48*  CALCIUM 9.0  PROT 6.9  ALBUMIN 4.0  AST 27  ALT 30  ALKPHOS 100  BILITOT 0.4  GFRNONAA 46*  GFRAA 53*  ANIONGAP 9     Hematology Recent Labs  Lab 08/22/19 1513  WBC 7.4  RBC 3.70*  HGB 11.5*  HCT 37.2*  MCV 100.5*  MCH 31.1  MCHC 30.9  RDW 13.9  PLT 133*    BNPNo results for input(s): BNP, PROBNP in the last 168 hours.   DDimer No results for input(s): DDIMER in the last 168 hours.   Radiology    DG Chest Portable 1 View  Result Date: 08/22/2019 CLINICAL DATA:  Chest pain since noon today, LEFT side chest pressure radiating down LEFT arm, history diabetes mellitus, renal cell carcinoma, hypertension, former smoker, coronary artery disease, pancreatitis EXAM: PORTABLE CHEST 1 VIEW COMPARISON:  Portable exam 1352 hours compared to 01/14/2019 FINDINGS: Normal heart size post TAVR. Mediastinal contours and pulmonary vascularity normal. Atherosclerotic calcification aorta. Emphysematous and bronchitic changes consistent with COPD. Two nodular foci are identified at the lateral RIGHT chest on one view, with only a single nodule seen on the second view unchanged since 2019 likely a calcified granuloma; second nodule likely reflects a nipple  shadow. Lungs clear. No pulmonary infiltrate, pleural effusion, or pneumothorax. Bones demineralized with scattered degenerative changes of the thoracic spine. IMPRESSION: COPD changes with stable nodular density at the lateral mid RIGHT chest. Probable RIGHT nipple shadow seen on single view but not seen on second. No acute abnormalities. Electronically Signed   By: Lavonia Dana M.D.   On: 08/22/2019 14:05    Cardiac Studies     Patient Profile     73 y.o. male with history of severe AS s/p TAVR in December 2018, CAD, cardiomyopathy and HTN admitted with unstable angina, elevated troponin c/w NSTEMI.   Assessment & Plan    1. CAD/NSTEMI: See full consult note from 1:46 am by Dr. Marletta Lor. He is presenting with unstable  angina. Troponin is elevated. Cardiac cath is indicated. Will add to the cath schedule. Continue IV heparin. Continue ASA, Plavix and statin. Will plan cardiac cath with possible PCI today.  I have reviewed the risks, indications, and alternatives to cardiac catheterization, possible angioplasty, and stenting with the patient. Risks include but are not limited to bleeding, infection, vascular injury, stroke, myocardial infection, arrhythmia, kidney injury, radiation-related injury in the case of prolonged fluoroscopy use, emergency cardiac surgery, and death. The patient understands the risks of serious complication is 1-2 in 5366 with diagnostic cardiac cath and 1-2% or less with angioplasty/stenting.  2. Chronic kidney disease, stage 3: creatinine stable. Baseline 1.5-1.6.   For questions or updates, please contact [place] Please consult [email or web address] for contact info under      Signed, Lauree Chandler, MD  08/23/2019, 9:12 AM

## 2019-08-23 NOTE — Progress Notes (Signed)
ANTICOAGULATION CONSULT NOTE  Pharmacy Consult for heparin Indication: chest pain/ACS  No Known Allergies  Patient Measurements: Height: 5' 10"  (177.8 cm) Weight: 135 lb 2.3 oz (61.3 kg) IBW/kg (Calculated) : 73 Heparin Dosing Weight: 62 kg  Vital Signs: Temp: 97.6 F (36.4 C) (12/11 0425) Temp Source: Oral (12/11 0425) BP: 142/81 (12/11 0915) Pulse Rate: 64 (12/11 0915)  Labs: Recent Labs    08/22/19 1513 08/22/19 1707 08/22/19 2036 08/23/19 0757  HGB 11.5*  --   --   --   HCT 37.2*  --   --   --   PLT 133*  --   --   --   HEPARINUNFRC  --   --   --  0.29*  CREATININE 1.48*  --   --   --   TROPONINIHS 34* 121* 665*  --     Estimated Creatinine Clearance: 37.4 mL/min (A) (by C-G formula based on SCr of 1.48 mg/dL (H)).   Medical History: Past Medical History:  Diagnosis Date  . Arthritis   . CAD (coronary artery disease)    a. 11/2016 in [place]: STEMI s/p DES to mid LAD, DES to diagonal, DES x 2 to proximal and mid RCA   . Essential hypertension   . Full dentures   . GERD (gastroesophageal reflux disease)   . History of stroke   . Hyperlipidemia   . Myocardial infarction ([place])   . Pancreatic cancer ([place])   . Pneumonia   . Renal cell carcinoma ([place])   . S/P TAVR (transcatheter aortic valve replacement)    a. 08/2017:  Edwards Sapien 3 THV (size 26 mm, model #9600CM26A , serial # [id number])  . Severe aortic stenosis    a. 08/2017: s/p TAVR by Dr. Angelena Form and Dr. Cyndia Bent.   . Type 2 diabetes mellitus ([place])   . Wears glasses   . Wears hearing aid    Assessment: 73 yo man to start heparin for r/o ACS.  He was not on anticoagulation PTA.   Initial heparin level came back slightly subtherapeutic at 0.29, on 750 units/hr. Hgb 11.5, plt 133. Trop 47>076. No s/sx of bleeding.   Goal of Therapy:  Heparin level 0.3-0.7 units/ml Monitor platelets by anticoagulation protocol: Yes   Plan:  Increase heparin infusion to 850 units/hr Check heparin level in 6  hours Daily HL and CBC while on heparin Monitor for bleeding complications  Thanks for allowing pharmacy to be a part of this patient's care.  Antonietta Jewel, PharmD, BCCCP Clinical Pharmacist  Phone: [phone]  Please check AMION for all [place] phone numbers After 10:00 PM, call [place] [phone] 08/23/2019,9:19 AM

## 2019-08-23 NOTE — CV Procedure (Signed)
   Acute coronary syndrome with prior history of TAVR and multiple stents in right coronary and left coronary system.  Right radial approach using real-time vascular ultrasound for access.  New 99% distal RCA stenosis beyond 2 previously placed stents.  Patent proximal and mid RCA stent.  Patent stents in LAD, diagonal.  Ramus intermedius with 70% mid stenosis.  Did not cross the TAVR aortic valve (Sapien)  PCI and stent distal 99% stenosis with reduction to 0% with TIMI grade III flow.  Complicated procedure requiring guide liner to advance stent to the site.  Also had difficulty crossing the stenosis due to cardiac motion and eccentric nature of this stenosis requiring wire escalation.  The vessel was transiently totally occluded until we were able to cross.  Stent used was a 2 seven 5 x 22 Onyx postdilated to 3.0 throughout the stent length at 14 atm.

## 2019-08-23 NOTE — Progress Notes (Signed)
[facility] PROGRESS NOTE  RAJAH TAGLIAFERRO DJS:[id number] DOB: 22-Apr-1946 DOA: 08/22/2019 PCP: [name], MD  Assessment/Plan:  Manuel Olson is a 73 y.o. male with medical history significant for coronary artery disease with history of PCI, severe aortic stenosis status post TAVR, diabetes mellitus, renal and pancreatic cancer, hypertension, presented with chest pains. He describes it as pressure-like and similar to his chest pain he had in [place] where he had a heart attack and subsequently stents were placed.  No associated difficulty breathing, nausea vomiting or palpitations. At the time of my evaluation chest pain has resolved without intervention, he is able to move his left shoulder without limitation.    ED Course: Systolic blood pressure 885O to 190s, O2 sats greater than 95% on room air. Hs troponin 34.  EKG sinus rhythm without significant change from prior.  Portable chest x-ray without acute abnormality.  NSTEMI. h/o Multivessel CAD status post DES to the LAD, DES to the diagonal, and DES x2 to the RCA in 2018.  EKG unchanged, Hs Troponin initially 34 >> 121.  Last echo 11/24//2020, EF 50%, with grade 1 diastolic dysfunction, LV global hypokinesis. -cont per cardiology. on Plavix, statins. BB. Iv heparin   Chronic mild thrombocytopenia-platelets 133, at baseline. -Hemoglobin stable, monitor   CKD 3-stable, creatinine 1.4, at baseline 1.4-1.6. monitor   History of Severe aortic stenosis s/p TAVR- 2018, follows with cardiology, Dr. Domenic Polite.  Controlled diabetes mellitus. HA1c 05/14/2019 7.3.monitor on insulin regimen   Hypertension-Resumed home losartan. 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   Code Status: full Family Communication: d/w patient, his family  (indicate person spoken with, relationship, and if by phone,  the number) Disposition Plan: pend [facility]   Consultants:  Cardiology   Procedures:  Pend cardiology   Antibiotics: Anti-infectives (From admission, onward)   None        (indicate start date, and stop date if known)  HPI/Subjective: No acute chest pains. No distress   Objective: Vitals:   08/23/19 0825 08/23/19 0915  BP: 135/76 (!) 142/81  Pulse: 63 64  Resp:    Temp:    SpO2: 94% 96%    Intake/Output Summary (Last 24 hours) at 08/23/2019 1153 Last data filed at 08/23/2019 0758 Gross per 24 hour  Intake 347.06 ml  Output 450 ml  Net -102.94 ml   Filed Weights   08/22/19 1319 08/22/19 2314 08/23/19 0425  Weight: 62 kg 61.3 kg 61.3 kg    Exam:   General:  No distress   Cardiovascular: s1,s2 rrr  Respiratory: CTA BL  Abdomen: soft, nt   Musculoskeletal: no leg edema    Data Reviewed: Basic Metabolic Panel: Recent Labs  Lab 08/22/19 1513  NA 136  K 4.2  CL 102  CO2 25  GLUCOSE 132*  BUN 28*  CREATININE 1.48*  CALCIUM 9.0   Liver Function Tests: Recent Labs  Lab 08/22/19 1513  AST 27  ALT 30  ALKPHOS 100  BILITOT 0.4  PROT 6.9  ALBUMIN 4.0   No results for input(s): LIPASE, AMYLASE in the last 168 hours. No results for input(s): AMMONIA in the last 168 hours. CBC: Recent Labs  Lab 08/22/19 1513  WBC 7.4  NEUTROABS 5.6  HGB 11.5*  HCT 37.2*  MCV 100.5*  PLT 133*   Cardiac Enzymes: No results for input(s): CKTOTAL, CKMB, CKMBINDEX,  TROPONINI in the last 168 hours. BNP (last 3 results) No results for input(s): BNP in the last 8760 hours.  ProBNP (last 3 results) No results for input(s): PROBNP in the last 8760 hours.  CBG: Recent Labs  Lab 08/22/19 2148  GLUCAP 188*    Recent Results (from the past 240 hour(s))  SARS CORONAVIRUS 2 (TAT 6-24 HRS) Nasopharyngeal Nasopharyngeal Swab     Status: None   Collection Time: 08/22/19  4:40 PM   Specimen: Nasopharyngeal Swab  Result Value Ref Range Status   SARS  Coronavirus 2 NEGATIVE NEGATIVE Final    Comment: (NOTE) SARS-CoV-2 target nucleic acids are NOT DETECTED. The SARS-CoV-2 RNA is generally detectable in upper and lower respiratory specimens during the acute phase of infection. Negative results do not preclude SARS-CoV-2 infection, do not rule out co-infections with other pathogens, and should not be used as the sole basis for treatment or other patient management decisions. Negative results must be combined with clinical observations, patient history, and epidemiological information. The expected result is Negative. Fact Sheet for Patients: SugarRoll.be Fact Sheet for Healthcare Providers: https://www.woods-mathews.com/ This test is not yet approved or cleared by the [place] FDA and  has been authorized for detection and/or diagnosis of SARS-CoV-2 by FDA under an Emergency Use Authorization (EUA). This EUA will remain  in effect (meaning this test can be used) for the duration of the COVID-19 declaration under Section 56 4(b)(1) of the Act, 21 U.S.C. section 360bbb-3(b)(1), unless the authorization is terminated or revoked sooner. Performed at [facility], [place] [address], [place], [place] [address]      Studies: DG Chest Portable 1 View  Result Date: 08/22/2019 CLINICAL DATA:  Chest pain since noon today, LEFT side chest pressure radiating down LEFT arm, history diabetes mellitus, renal cell carcinoma, hypertension, former smoker, coronary artery disease, pancreatitis EXAM: PORTABLE CHEST 1 VIEW COMPARISON:  Portable exam 1352 hours compared to 01/14/2019 FINDINGS: Normal heart size post TAVR. Mediastinal contours and pulmonary vascularity normal. Atherosclerotic calcification aorta. Emphysematous and bronchitic changes consistent with COPD. Two nodular foci are identified at the lateral RIGHT chest on one view, with only a single nodule seen on the second view unchanged since 2019  likely a calcified granuloma; second nodule likely reflects a nipple shadow. Lungs clear. No pulmonary infiltrate, pleural effusion, or pneumothorax. Bones demineralized with scattered degenerative changes of the thoracic spine. IMPRESSION: COPD changes with stable nodular density at the lateral mid RIGHT chest. Probable RIGHT nipple shadow seen on single view but not seen on second. No acute abnormalities. Electronically Signed   By: Lavonia Dana M.D.   On: 08/22/2019 14:05    Scheduled Meds: . [START ON 08/24/2019] aspirin EC  81 mg Oral Daily  . atorvastatin  20 mg Oral Daily  . clopidogrel  75 mg Oral Daily  . insulin glargine  9 Units Subcutaneous Daily  . lipase/protease/amylase  36,000 Units Oral TID WC  . losartan  12.5 mg Oral Daily  . sodium chloride flush  3 mL Intravenous Q12H   Continuous Infusions: . sodium chloride    . sodium chloride 75 mL/hr at 08/23/19 1120  . heparin 850 Units/hr (08/23/19 0924)  . nitroGLYCERIN 10 mcg/min (08/23/19 0600)    Active Problems:   Chest pain    Time spent: >25 minutes     Kinnie Feil  [facility] Pager [phone]. If 7PM-7AM, please contact night-coverage at [email or web address], password [facility] 08/23/2019, 11:53 AM  LOS: 1 day

## 2019-08-23 NOTE — Consult Note (Signed)
Cardiology Consultation:   Patient ID: Manuel Olson MRN: [id number]; DOB: 06-02-1946  Admit date: 08/22/2019 Date of Consult: 08/23/2019  Primary Care Provider: [name], MD Primary Cardiologist: Rozann Lesches, MD  Primary Electrophysiologist:  None    Patient Profile:   Manuel Olson is a 73 y.o. male with a hx of known CAD s/p STEMI 11/2016 with DES to LAD, D1, RCA, HTN, severe AS s/p TAVR, and pancreatic and renal cancer in remission who presents with chest pain, found to have NSTEMI.   History of Present Illness:   Manuel Olson was in his usual state of good health until this afternoon. He saw his cardiologist (Dr. Domenic Polite) just last week and reported no anginal symptoms and no change in his exercise tolerance. This afternoon, while working on repairing an old truck when he noted gradual onset of substernal chest discomfort that radiated to his L shoulder. This started at [place] on 12/10 and he presented to the [place] around [place].  Upon arrival, patient hypertensive to 190s/90s with HR 62. Initial EKG with Qs in anterior leads and some suggestion of hyperacute T waves, but no ST elevations. Initial hsTn 34. Patient given SLN with near resolution of pain. Repeat hsTn 121 -> 665, which prompted initiation of heparin gtt and transfer to [facility] for management of NSTEMI.   On review of system, patient denies SOB or worsening DOE. Does not report LE edema, orthopnea, PND. No fevers, chills, cough and no COVID known COVID exposure.   Past Medical History:  Diagnosis Date   Arthritis    CAD (coronary artery disease)    a. 11/2016 in [place]: STEMI s/p DES to mid LAD, DES to diagonal, DES x 2 to proximal and mid RCA    Essential hypertension    Full dentures    GERD (gastroesophageal reflux disease)    History of stroke    Hyperlipidemia    Myocardial infarction [facility])    Pancreatic cancer ([place])    Pneumonia    Renal cell carcinoma ([place])    S/P TAVR  (transcatheter aortic valve replacement)    a. 08/2017:  Edwards Sapien 3 THV (size 26 mm, model #9600CM26A , serial # [id number])   Severe aortic stenosis    a. 08/2017: s/p TAVR by Dr. Angelena Form and Dr. Cyndia Bent.    Type 2 diabetes mellitus ([place])    Wears glasses    Wears hearing aid     Past Surgical History:  Procedure Laterality Date   APPENDECTOMY     CARDIAC CATHETERIZATION     CORONARY STENT PLACEMENT     x 4 in March 2018   EUS N/A 08/02/2017   Procedure: UPPER ENDOSCOPIC ULTRASOUND (EUS) RADIAL;  Surgeon: [name], MD;  Location: [facility];  Service: Endoscopy;  Laterality: N/A;   EYE SURGERY Left    HAND SURGERY Bilateral    HERNIA REPAIR Right    LAPAROSCOPY N/A 02/19/2018   Procedure: DIAGNOSTIC LAPAROSCOPY, ERAS PATHWAY;  Surgeon: [name], MD;  Location: [place];  Service: General;  Laterality: N/A;   MULTIPLE TOOTH EXTRACTIONS     NEPHRECTOMY Left 02/19/2018   Procedure: OPEN LEFT RADICAL NEPHRECTOMY;  Surgeon: Cleon Gustin, MD;  Location: [place];  Service: Urology;  Laterality: Left;   PORT-A-CATH REMOVAL N/A 06/13/2019   Procedure: PORT REMOVAL;  Surgeon: [name], MD;  Location: [place];  Service: General;  Laterality: N/A;   PORTACATH PLACEMENT N/A 09/15/2017   Procedure: INSERTION PORT-A-CATH;  Surgeon: [name], MD;  Location: [place];  Service: General;  Laterality: N/A;   RIGHT/LEFT HEART CATH AND CORONARY ANGIOGRAPHY N/A 07/05/2017   Procedure: RIGHT/LEFT HEART CATH AND CORONARY ANGIOGRAPHY;  Surgeon: [name], MD;  Location: [place] [facility];  Service: Cardiovascular;  Laterality: N/A;   SHOULDER ARTHROSCOPY WITH ROTATOR CUFF REPAIR Left    TEE WITHOUT CARDIOVERSION N/A 08/22/2017   Procedure: TRANSESOPHAGEAL ECHOCARDIOGRAM (TEE);  Surgeon: Burnell Blanks, MD;  Location: [place];  Service: Open Heart Surgery;  Laterality: N/A;   TRANSCATHETER AORTIC VALVE REPLACEMENT, TRANSFEMORAL N/A 08/22/2017    Procedure: TRANSCATHETER AORTIC VALVE REPLACEMENT, TRANSFEMORAL;  Surgeon: Burnell Blanks, MD;  Location: [place];  Service: Open Heart Surgery;  Laterality: N/A;   WHIPPLE PROCEDURE N/A 02/19/2018   Procedure: WHIPPLE PROCEDURE;  Surgeon: [name], MD;  Location: [place];  Service: General;  Laterality: N/A;     Home Medications:  Prior to Admission medications   Medication Sig Start Date End Date Taking? Authorizing Provider  acetaminophen (TYLENOL) 325 MG tablet Take 650 mg by mouth every 6 (six) hours as needed (for pain.).   Yes [provider]  atorvastatin (LIPITOR) 20 MG tablet TAKE 1 TABLET BY MOUTH ONCE DAILY. 06/27/19  Yes Satira Sark, MD  clopidogrel (PLAVIX) 75 MG tablet TAKE 1 TABLET BY MOUTH ONCE DAILY. Patient taking differently: Take 75 mg by mouth daily.  11/01/18  Yes Satira Sark, MD  CREON 36000 units CPEP capsule TAKE 1 CAPSULE BY MOUTH BEFORE EACH MEAL AND SNACK. TAKE UP TO 6 CAPSULES PER DAY. Patient taking differently: Take 36,000 Units by mouth See admin instructions. Take 1 capsule before each meal and with snacks. 04/09/19  Yes Ladell Pier, MD  Insulin Glargine (BASAGLAR KWIKPEN) 100 UNIT/ML SOPN Inject 0.1 mLs (10 Units total) into the skin at bedtime. Patient taking differently: Inject 9 Units into the skin daily.  01/24/19  Yes Philemon Kingdom, MD  insulin lispro (HUMALOG KWIKPEN) 100 UNIT/ML KwikPen Inject 3-5 units 3 times a day with meals. Patient taking differently: Inject 5 Units into the skin 3 (three) times daily before meals. Inject 3-5 units 3 times a day with meals. 01/24/19  Yes Philemon Kingdom, MD  losartan (COZAAR) 25 MG tablet Take 0.5 tablets (12.5 mg total) by mouth daily. 08/12/19 11/10/19 Yes Satira Sark, MD  Insulin Pen Needle (PEN NEEDLES) 31G X 6 MM MISC Use as directed to check blood sugar 4 times a day. 11/02/18   Philemon Kingdom, MD  oxyCODONE (OXY IR/ROXICODONE) 5 MG immediate release tablet Take 1  tablet (5 mg total) by mouth every 6 (six) hours as needed for severe pain. 06/13/19   [name], MD    Inpatient Medications: Scheduled Meds:  atorvastatin  20 mg Oral Daily   clopidogrel  75 mg Oral Daily   insulin glargine  9 Units Subcutaneous Daily   lipase/protease/amylase  36,000 Units Oral TID WC   losartan  12.5 mg Oral Daily   Continuous Infusions:  heparin 750 Units/hr (08/22/19 2119)   nitroGLYCERIN 10 mcg/min (08/22/19 2341)   PRN Meds: acetaminophen **OR** acetaminophen, hydrALAZINE, morphine injection, nitroGLYCERIN, ondansetron **OR** ondansetron (ZOFRAN) IV, polyethylene glycol  Allergies:   No Known Allergies  Social History:   Social History   Socioeconomic History   Marital status: Married    Spouse name: Not on file   Number of children: Not on file   Years of education: Not on file  Highest education level: Not on file  Occupational History   Not on file  Tobacco Use   Smoking status: Former Smoker    Packs/day: 1.00    Years: 57.00    Pack years: 57.00    Types: Cigarettes    Start date: 09/12/1958    Quit date: 04/12/2016    Years since quitting: 3.3   Smokeless tobacco: Never Used  Substance and Sexual Activity   Alcohol use: No   Drug use: No   Sexual activity: Yes  Other Topics Concern   Not on file  Social History Narrative   Not on file   Social Determinants of Health   Financial Resource Strain:    Difficulty of Paying Living Expenses: Not on file  Food Insecurity:    Worried About Charity fundraiser in the Last Year: Not on file   [employer] of Food in the Last Year: Not on file  Transportation Needs:    Lack of Transportation (Medical): Not on file   Lack of Transportation (Non-Medical): Not on file  Physical Activity:    Days of Exercise per Week: Not on file   Minutes of Exercise per Session: Not on file  Stress:    Feeling of Stress : Not on file  Social Connections:    Frequency of Communication with Friends and  Family: Not on file   Frequency of Social Gatherings with Friends and Family: Not on file   Attends Religious Services: Not on file   Active Member of Clubs or Organizations: Not on file   Attends Archivist Meetings: Not on file   Marital Status: Not on file  Intimate Partner Violence:    Fear of Current or Ex-Partner: Not on file   Emotionally Abused: Not on file   Physically Abused: Not on file   Sexually Abused: Not on file    Family History:    Family History  Problem Relation Age of Onset   Lupus Mother    CAD Brother    Hypertension Brother      Review of Systems: [y] = yes, [ ]  = no    General: Weight gain [ ] ; Weight loss [ ] ; Anorexia [ ] ; Fatigue [ ] ; Fever [ ] ; Chills [ ] ; Weakness [ ]   Cardiac: Chest pain/pressure Blue.Reese ]; Resting SOB [ ] ; Exertional SOB [ ] ; Orthopnea [ ] ; Pedal Edema [ ] ; Palpitations [ ] ; Syncope [ ] ; Presyncope [ ] ; Paroxysmal nocturnal dyspnea[ ]   Pulmonary: Cough [ ] ; Wheezing[ ] ; Hemoptysis[ ] ; Sputum [ ] ; Snoring [ ]   GI: Vomiting[ ] ; Dysphagia[ ] ; Melena[ ] ; Hematochezia [ ] ; Heartburn[ ] ; Abdominal pain [ ] ; Constipation [ ] ; Diarrhea [ ] ; BRBPR [ ]   GU: Hematuria[ ] ; Dysuria [ ] ; Nocturia[ ]   Vascular: Pain in legs with walking [ ] ; Pain in feet with lying flat [ ] ; Non-healing sores [ ] ; Stroke [ ] ; TIA [ ] ; Slurred speech [ ] ;  Neuro: Headaches[ ] ; Vertigo[ ] ; Seizures[ ] ; Paresthesias[ ] ;Blurred vision [ ] ; Diplopia [ ] ; Vision changes [ ]   Ortho/Skin: Arthritis [ ] ; Joint pain [ ] ; Muscle pain [ ] ; Joint swelling [ ] ; Back Pain [ ] ; Rash [ ]   Psych: Depression[ ] ; Anxiety[ ]   Heme: Bleeding problems [ ] ; Clotting disorders [ ] ; Anemia [ ]   Endocrine: Diabetes [ ] ; Thyroid dysfunction[ ]   Physical Exam/Data:   Vitals:   08/22/19 2200 08/22/19 2314 08/22/19 2325 08/23/19 0000  BP: (!) 143/74 [name])  110/94 111/61 113/68  Pulse: 70 (!) 122    Resp: 14 18    Temp:  98.4 F (36.9 C)    TempSrc:  Oral    SpO2: 97% 98%    Weight:   61.3 kg    Height:  5' 10"  (1.778 m)     No intake or output data in the 24 hours ending 08/23/19 0029 Filed Weights   08/22/19 1319 08/22/19 2314  Weight: 62 kg 61.3 kg   Body mass index is 19.39 kg/m.  General:  Thin, pleasant caucasian man. Hard of hearing. In no acute distress.  HEENT: normal Neck: no JVD Vascular: No carotid bruits; FA pulses 2+ bilaterally without bruits  Cardiac:  normal S1, S2; RRR; no murmurs. Distant heart sounds.  Lungs:  clear to auscultation bilaterally, no wheezing, rhonchi or rales  Abd: soft, nontender, no hepatomegaly. Thin.  Ext: no edema. Wwp.  Skin: warm and dry  Neuro:  CNs 2-12 intact, no focal abnormalities noted Psych:  Normal affect   EKG:  The EKG was personally reviewed and demonstrates SR with normal axis, normal interval. Anterior Q waves present. No ST elevates.   Relevant CV Studies: 07/2019:  1. Left ventricular ejection fraction, by visual estimation, is 50%. The left ventricle has low normal function. There is borderline left ventricular hypertrophy.  2. Elevated left atrial pressure.  3. Left ventricular diastolic parameters are consistent with Grade I diastolic dysfunction (impaired relaxation).  4. The left ventricle demonstrates global hypokinesis.  5. Global right ventricle has normal systolic function.The right ventricular size is normal. No increase in right ventricular wall thickness.  6. Left atrial size was normal.  7. Right atrial size was normal.  8. Presence of pericardial fat pad.  9. Mild to moderate mitral annular calcification. 10. The mitral valve is grossly normal. Trace mitral valve regurgitation. 11. The tricuspid valve is grossly normal. Tricuspid valve regurgitation is trivial. 12. A 69m Edwards Sapien 3 THV is present in the aortic position. No perivalvular leak. 13. Aortic valve mean gradient measures 9.0 mmHg. 14. Aortic valve regurgitation is not visualized. 15. The pulmonic valve was grossly  normal. Pulmonic valve regurgitation is trivial. 16. The inferior vena cava is dilated in size with >50% respiratory variability, suggesting right atrial pressure of 8 mmHg. 17. TR signal is inadequate for assessing pulmonary artery systolic pressure.  Laboratory Data:  Chemistry Recent Labs  Lab 08/22/19 1513  NA 136  K 4.2  CL 102  CO2 25  GLUCOSE 132*  BUN 28*  CREATININE 1.48*  CALCIUM 9.0  GFRNONAA 46*  GFRAA 53*  ANIONGAP 9    Recent Labs  Lab 08/22/19 1513  PROT 6.9  ALBUMIN 4.0  AST 27  ALT 30  ALKPHOS 100  BILITOT 0.4   Hematology Recent Labs  Lab 08/22/19 1513  WBC 7.4  RBC 3.70*  HGB 11.5*  HCT 37.2*  MCV 100.5*  MCH 31.1  MCHC 30.9  RDW 13.9  PLT 133*   Cardiac EnzymesNo results for input(s): TROPONINI in the last 168 hours. No results for input(s): TROPIPOC in the last 168 hours.  BNPNo results for input(s): BNP, PROBNP in the last 168 hours.  DDimer No results for input(s): DDIMER in the last 168 hours.  Radiology/Studies:  DG Chest Portable 1 View  Result Date: 08/22/2019 CLINICAL DATA:  Chest pain since noon today, LEFT side chest pressure radiating down LEFT arm, history diabetes mellitus, renal cell carcinoma, hypertension, former smoker, coronary artery disease,  pancreatitis EXAM: PORTABLE CHEST 1 VIEW COMPARISON:  Portable exam 1352 hours compared to 01/14/2019 FINDINGS: Normal heart size post TAVR. Mediastinal contours and pulmonary vascularity normal. Atherosclerotic calcification aorta. Emphysematous and bronchitic changes consistent with COPD. Two nodular foci are identified at the lateral RIGHT chest on one view, with only a single nodule seen on the second view unchanged since 2019 likely a calcified granuloma; second nodule likely reflects a nipple shadow. Lungs clear. No pulmonary infiltrate, pleural effusion, or pneumothorax. Bones demineralized with scattered degenerative changes of the thoracic spine. IMPRESSION: COPD changes with  stable nodular density at the lateral mid RIGHT chest. Probable RIGHT nipple shadow seen on single view but not seen on second. No acute abnormalities. Electronically Signed   By: Lavonia Dana M.D.   On: 08/22/2019 14:05    Assessment and Plan:   Mr. Gellert is a 73 year old man with known CAD and prior STEMI s/p DES to LAD, D1, RCA who presents after an episode of chest pain found to have elevated hsTn concerning for NSTEMI  #NSTEMI -- Agree with heparin gtt -- Continue home DAPT with ASA + Plavix.  -- Started nitro gtt for ongoing 3/10 chest discomfort. Would titrate to pain free as long as SBP > 100.  -- 250 cc NS bolus for borderline Bps.  -- Would order limited TTE in the AM to assess function and WMAs.  -- Please ensure lipid panel and Hba1c sent to risk stratify.   #HTN -- Hold losartan for now to make BP room for nitro gtt.   For questions or updates, please contact [place] Please consult [email or web address] for contact info under    Signed, [name], MD  08/23/2019 12:29 AM

## 2019-08-24 ENCOUNTER — Encounter (HOSPITAL_COMMUNITY): Payer: Self-pay | Admitting: Internal Medicine

## 2019-08-24 DIAGNOSIS — I209 Angina pectoris, unspecified: Secondary | ICD-10-CM

## 2019-08-24 DIAGNOSIS — I214 Non-ST elevation (NSTEMI) myocardial infarction: Principal | ICD-10-CM

## 2019-08-24 LAB — BASIC METABOLIC PANEL
Anion gap: 10 (ref 5–15)
BUN: 26 mg/dL — ABNORMAL HIGH (ref 8–23)
CO2: 21 mmol/L — ABNORMAL LOW (ref 22–32)
Calcium: 8.6 mg/dL — ABNORMAL LOW (ref 8.9–10.3)
Chloride: 105 mmol/L (ref 98–111)
Creatinine, Ser: 1.52 mg/dL — ABNORMAL HIGH (ref 0.61–1.24)
GFR calc Af Amer: 51 mL/min — ABNORMAL LOW (ref >60)
GFR calc non Af Amer: 44 mL/min — ABNORMAL LOW (ref >60)
Glucose, Bld: 148 mg/dL — ABNORMAL HIGH (ref 70–99)
Potassium: 4.4 mmol/L (ref 3.5–5.1)
Sodium: 136 mmol/L (ref 135–145)

## 2019-08-24 LAB — CBC
HCT: 31.1 % — ABNORMAL LOW (ref 39.0–52.0)
Hemoglobin: 10.1 g/dL — ABNORMAL LOW (ref 13.0–17.0)
MCH: 31.3 pg (ref 26.0–34.0)
MCHC: 32.5 g/dL (ref 30.0–36.0)
MCV: 96.3 fL (ref 80.0–100.0)
Platelets: 106 10*3/uL — ABNORMAL LOW (ref 150–400)
RBC: 3.23 MIL/uL — ABNORMAL LOW (ref 4.22–5.81)
RDW: 13.9 % (ref 11.5–15.5)
WBC: 6.7 10*3/uL (ref 4.0–10.5)
nRBC: 0 % (ref 0.0–0.2)

## 2019-08-24 LAB — GLUCOSE, CAPILLARY
Glucose-Capillary: 144 mg/dL — ABNORMAL HIGH (ref 70–99)
Glucose-Capillary: 137 mg/dL — ABNORMAL HIGH (ref 70–99)
Glucose-Capillary: 235 mg/dL — ABNORMAL HIGH (ref 70–99)
Glucose-Capillary: 200 mg/dL — ABNORMAL HIGH (ref 70–99)

## 2019-08-24 MED ORDER — ASPIRIN 81 MG PO CHEW: 81.0000 mg | CHEWABLE_TABLET | Freq: Every day | ORAL | 2 refills | Status: DC

## 2019-08-24 MED ORDER — CLOPIDOGREL BISULFATE 75 MG PO TABS: 75.0000 mg | ORAL_TABLET | Freq: Every day | ORAL | 1 refills | Status: DC

## 2019-08-24 MED ORDER — NITROGLYCERIN 0.4 MG SL SUBL: 0.4000 mg | SUBLINGUAL_TABLET | SUBLINGUAL | 0 refills | Status: DC | PRN

## 2019-08-24 NOTE — Discharge Instructions (Signed)
Call [facility] at [phone] if any bleeding, swelling or drainage at cath site.  May shower, no tub baths for 48 hours for groin sticks. No lifting over 5 pounds for 5 days.  No Driving for 5 days  Heart Healthy Diabetic Diet   Do Not stop asprin and plavix these are keeping new stent open -free from clot.

## 2019-08-24 NOTE — Progress Notes (Signed)
TR BAND REMOVAL  LOCATION:    right radial  DEFLATED PER PROTOCOL:    Yes.    TIME BAND OFF / DRESSING APPLIED:    0430   SITE UPON ARRIVAL:    Level 1  SITE AFTER BAND REMOVAL:    Level 1 (bruised and soft)  CIRCULATION SENSATION AND MOVEMENT:    Within Normal Limits   Yes.    COMMENTS:   Post Tr band instructions given, applied sterile dressing, good capillary refill.

## 2019-08-24 NOTE — Discharge Summary (Signed)
Physician Discharge Summary  HAMP MORELAND ZOX:[id number] DOB: 16-Jan-1946 DOA: 08/22/2019  PCP: [name], MD  Admit date: 08/22/2019 Discharge date: 08/24/2019  Time spent: >25 minutes  Recommendations for Outpatient Follow-up:  PCP next week Cardiology in 7-10 days Cardiac rehab at discharge  Discharge Diagnoses:  Active Problems:   Essential hypertension, benign   CAD (coronary artery disease)   Mixed hyperlipidemia   Angina pectoris [facility])   Discharge Condition: stable   Diet recommendation: low sodium., carb modified   Filed Weights   08/22/19 2314 08/23/19 0425 08/24/19 0519  Weight: 61.3 kg 61.3 kg 59.9 kg    History of present illness:   SEQUOYAH COUNTERMAN a 73 y.o.malewith medical history significant forcoronary artery disease with history of PCI, severe aortic stenosis status post TAVR, diabetes mellitus, renal and pancreatic cancer, hypertension, presented with chest pains.He describes it as pressure-like and similar to his chest pain he had in [place] where he had a heart attack and subsequently stents were placed. No associated difficulty breathing, nausea vomiting or palpitations. At the time of my evaluation chest pain has resolved without intervention,he is able to move hisleftshoulder without limitation.  ED Course:Systolic blood pressure 811B to 190s, O2 sats greater than 95% on room air. Hstroponin 34. EKG sinus rhythmwithout significant change from prior. Portable chest x-ray without acute abnormality.  Hospital Course:   NSTEMI. h/oMultivessel CAD status post DES to the LAD, DES to the diagonal, and DES x2 to the RCA in 2018.EKG unchanged, Hs Troponin initially 34 >> 121.Last echo 11/24//2020, EF 50%, with grade 1 diastolic dysfunction, LV global hypokinesis.  -Seen by cardiology, underwent LHC and placed stent in distal RCA. Cardiology recommended outpatient follow up, cardiac rehab, on aspirin, statin,plavix,  ARB  Chronic mild thrombocytopenia-platelets 133,at baseline.  CKD 3-stable,creatinine 1.4,atbaseline 1.4-1.6.   History ofSevereaortic stenosiss/p TAVR- 2018,follows with cardiology, Dr. Domenic Polite.  Controlled diabetes mellitus. HA1c 05/14/2019 7.3.  Hypertension-Resumed home losartan.   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  Hxof renal cell carcinoma of right kidney status post nephrectomy 2019.  Procedures:  LHC (i.e. Studies not automatically included, echos, thoracentesis, etc; not x-rays)  Consultations:  Cardiology   Discharge Exam: Vitals:   08/23/19 2304 08/24/19 0519  BP: 134/65 140/73  Pulse: 62 64  Resp:  16  Temp:  98.3 F (36.8 C)  SpO2: 96% 94%    General: no distress  Cardiovascular: s1,s2 rrr Respiratory: CTA BL  Discharge Instructions  Discharge Instructions    Amb Referral to Cardiac Rehabilitation   Complete by: As directed    Diagnosis:  NSTEMI Coronary Stents     After initial evaluation and assessments completed: Virtual Based Care may be provided alone or in conjunction with Phase 2 Cardiac Rehab based on patient barriers.: Yes   Diet - low sodium heart healthy   Complete by: As directed    Discharge instructions   Complete by: As directed    Please follow up with primary care physician next week Please follow up with cardiology in 7-10 days   Increase activity slowly   Complete by: As directed      Allergies as of 08/24/2019   No Known Allergies     Medication List    TAKE these medications   acetaminophen 325 MG tablet Commonly known as: TYLENOL Take 650 mg by mouth every 6 (six) hours as needed (for pain.).   aspirin 81 MG chewable tablet Chew 1 tablet (81 mg total) by  mouth daily. Start taking on: August 25, 2019   atorvastatin 20 MG tablet Commonly known as: LIPITOR TAKE 1 TABLET BY MOUTH ONCE DAILY.   Basaglar KwikPen 100 UNIT/ML Sopn Inject 0.1 mLs (10 Units  total) into the skin at bedtime. What changed:   how much to take  when to take this   clopidogrel 75 MG tablet Commonly known as: PLAVIX Take 1 tablet (75 mg total) by mouth daily with breakfast. Start taking on: August 25, 2019 What changed: when to take this   Creon 36000 UNITS Cpep capsule Generic drug: lipase/protease/amylase TAKE 1 CAPSULE BY MOUTH BEFORE EACH MEAL AND SNACK. TAKE UP TO 6 CAPSULES PER DAY. What changed: See the new instructions.   insulin lispro 100 UNIT/ML KwikPen Commonly known as: HumaLOG KwikPen Inject 3-5 units 3 times a day with meals. What changed:   how much to take  how to take this  when to take this   losartan 25 MG tablet Commonly known as: COZAAR Take 0.5 tablets (12.5 mg total) by mouth daily.   nitroGLYCERIN 0.4 MG SL tablet Commonly known as: NITROSTAT Place 1 tablet (0.4 mg total) under the tongue every 5 (five) minutes as needed for chest pain.   oxyCODONE 5 MG immediate release tablet Commonly known as: Oxy IR/ROXICODONE Take 1 tablet (5 mg total) by mouth every 6 (six) hours as needed for severe pain.   Pen Needles 31G X 6 MM Misc Use as directed to check blood sugar 4 times a day.      No Known Allergies Follow-up Information    Satira Sark, MD Follow up.   Specialty: Cardiology Why: the office will call Monday with date and time of appt.  I fyou have not heard by Tuesday then please call the office. Contact information: [place] [id number] [phone]            The results of significant diagnostics from this hospitalization (including imaging, microbiology, ancillary and laboratory) are listed below for reference.    Significant Diagnostic Studies: CARDIAC CATHETERIZATION  Addendum Date: 08/23/2019    A stent was successfully placed.   Non-ST elevation myocardial infarction due to de novo plaque rupture in the distal RCA.  Complicated but ultimately successful distal RCA  stent using a 22 x 2.75 Onyx postdilated to 3.0 decreasing 99% stenosis to 0% with TIMI grade III flow.  Left main is widely patent  LAD contains a patent mid vessel stent.  Proximal to the stent is eccentric 50% narrowing.  First diagonal contains a stent that is widely patent.  A moderate size ramus intermedius contains 50 to 75% stenosis in its mid segment.  Circumflex is relatively small and is widely patent.  The SAPIEN TAVR valve was not crossed. RECOMMENDATIONS:  Continue aspirin and Plavix.  Potentially eligible for discharge tomorrow.  Kidney function needs to be monitored closely and if there is a rise in creatinine tomorrow morning he should not be discharged but she did have a creatinine done 48 hours after the procedure which would be Sunday morning.   Result Date: 08/23/2019  Non-ST elevation myocardial infarction due to de novo plaque rupture in the distal RCA.  Complicated but ultimately successful distal RCA stent using a 22 x 2.75 Onyx postdilated to 3.0 decreasing 99% stenosis to 0% with TIMI grade III flow.  Left main is widely patent  LAD contains a patent mid vessel stent.  Proximal to the stent is eccentric 50% narrowing.  First diagonal contains a stent that is widely patent.  A moderate size ramus intermedius contains 50 to 75% stenosis in its mid segment.  Circumflex is relatively small and is widely patent.  The SAPIEN TAVR valve was not crossed. RECOMMENDATIONS:  Continue aspirin and Plavix.  Potentially eligible for discharge tomorrow.  Kidney function needs to be monitored closely and if there is a rise in creatinine tomorrow morning he should not be discharged but she did have a creatinine done 48 hours after the procedure which would be Sunday morning.  DG Chest Portable 1 View  Result Date: 08/22/2019 CLINICAL DATA:  Chest pain since noon today, LEFT side chest pressure radiating down LEFT arm, history diabetes mellitus, renal cell carcinoma, hypertension,  former smoker, coronary artery disease, pancreatitis EXAM: PORTABLE CHEST 1 VIEW COMPARISON:  Portable exam 1352 hours compared to 01/14/2019 FINDINGS: Normal heart size post TAVR. Mediastinal contours and pulmonary vascularity normal. Atherosclerotic calcification aorta. Emphysematous and bronchitic changes consistent with COPD. Two nodular foci are identified at the lateral RIGHT chest on one view, with only a single nodule seen on the second view unchanged since 2019 likely a calcified granuloma; second nodule likely reflects a nipple shadow. Lungs clear. No pulmonary infiltrate, pleural effusion, or pneumothorax. Bones demineralized with scattered degenerative changes of the thoracic spine. IMPRESSION: COPD changes with stable nodular density at the lateral mid RIGHT chest. Probable RIGHT nipple shadow seen on single view but not seen on second. No acute abnormalities. Electronically Signed   By: Mark  Boles M.D.   On: 08/22/2019 14:05   ECHOCARDIOGRAM COMPLETE  Result Date: 08/06/2019   ECHOCARDIOGRAM REPORT   Patient Name:   Kinnick R Barbary Date of Exam: 08/06/2019 Medical Rec #:  [id number]           Height:       70.0 in Accession #:    [id number]          Weight:       137.0 lb Date of Birth:  01/08/1946           BSA:          1.78 m Patient Age:    73 years            BP:           168/76 mmHg Patient Gender: M                   HR:           71  bpm. Exam Location:  [facility] Procedure: 2D Echo, Cardiac Doppler and Color Doppler Indications:    Z95.2 (ICD-10-CM) - S/P TAVR (transcatheter aortic valve                 replacement)  History:        Patient has prior history of Echocardiogram examinations, most                 recent 08/02/2018. CAD and Previous Myocardial Infarction, S/P                 TAVR, Aortic Valve Disease; Risk Factors:Hypertension,                 Dyslipidemia, Former Smoker and Diabetes. Aortic Valve: A 26                 Edwards Sapien bioprosthetic, stented aortic valve  (TAVR) Cancer  of head of pancreas ([place]) s/p Whipple 02/2018, H/O Severe AS.  Sonographer:    Alvino Chapel RCS Referring Phys: [place]  1. Left ventricular ejection fraction, by visual estimation, is 50%. The left ventricle has low normal function. There is borderline left ventricular hypertrophy.  2. Elevated left atrial pressure.  3. Left ventricular diastolic parameters are consistent with Grade I diastolic dysfunction (impaired relaxation).  4. The left ventricle demonstrates global hypokinesis.  5. Global right ventricle has normal systolic function.The right ventricular size is normal. No increase in right ventricular wall thickness.  6. Left atrial size was normal.  7. Right atrial size was normal.  8. Presence of pericardial fat pad.  9. Mild to moderate mitral annular calcification. 10. The mitral valve is grossly normal. Trace mitral valve regurgitation. 11. The tricuspid valve is grossly normal. Tricuspid valve regurgitation is trivial. 12. A 61m Edwards Sapien 3 THV is present in the aortic position. No perivalvular leak. 13. Aortic valve mean gradient measures 9.0 mmHg. 14. Aortic valve regurgitation is not visualized. 15. The pulmonic valve was grossly normal. Pulmonic valve regurgitation is trivial. 16. The inferior vena cava is dilated in size with >50% respiratory variability, suggesting right atrial pressure of 8 mmHg. 17. TR signal is inadequate for assessing pulmonary artery systolic pressure. FINDINGS  Left Ventricle: Left ventricular ejection fraction, by visual estimation, is 50%. The left ventricle has low normal function. The left ventricle demonstrates global hypokinesis. The left ventricular internal cavity size was the left ventricle is normal in size. There is borderline left ventricular hypertrophy. Left ventricular diastolic parameters are consistent with Grade I diastolic dysfunction (impaired relaxation). Elevated left atrial pressure. Right  Ventricle: The right ventricular size is normal. No increase in right ventricular wall thickness. Global RV systolic function is has normal systolic function. Left Atrium: Left atrial size was normal in size. Right Atrium: Right atrial size was normal in size Pericardium: There is no evidence of pericardial effusion. Presence of pericardial fat pad. Mitral Valve: The mitral valve is grossly normal. Mild to moderate mitral annular calcification. Trace mitral valve regurgitation. Tricuspid Valve: The tricuspid valve is grossly normal. Tricuspid valve regurgitation is trivial. Aortic Valve: The aortic valve has been repaired/replaced. Aortic valve regurgitation is not visualized. Aortic valve mean gradient measures 9.0 mmHg. Aortic valve peak gradient measures 17.3 mmHg. Aortic valve area, by VTI measures 1.34 cm. 26 Edwards Sapien bioprosthetic, stented aortic valve (TAVR) valve is present in the aortic position. Pulmonic Valve: The pulmonic valve was grossly normal. Pulmonic valve regurgitation is trivial. Aorta: The aortic root is normal in size and structure. Venous: The inferior vena cava is dilated in size with greater than 50% respiratory variability, suggesting right atrial pressure of 8 mmHg. IAS/Shunts: No atrial level shunt detected by color flow Doppler.  LEFT VENTRICLE PLAX 2D LVIDd:         5.51 cm       Diastology LVIDs:         4.02 cm       LV e' lateral:   4.35 cm/s LV PW:         1.13 cm       LV E/e' lateral: 14.7 LV IVS:        0.81 cm       LV e' medial:    4.57 cm/s LVOT diam:     1.90 cm       LV E/e' medial:  14.0 LV SV:  77 ml LV SV Index:   44.29 LVOT Area:     2.84 cm  LV Volumes (MOD) LV area d, A2C:    34.40 cm LV area d, A4C:    35.40 cm LV area s, A2C:    20.20 cm LV area s, A4C:    22.50 cm LV major d, A2C:   8.06 cm LV major d, A4C:   8.11 cm LV major s, A2C:   6.96 cm LV major s, A4C:   6.74 cm LV vol d, MOD A2C: 123.0 ml LV vol d, MOD A4C: 126.0 ml LV vol s, MOD A2C:  50.7 ml LV vol s, MOD A4C: 64.1 ml LV SV MOD A2C:     72.3 ml LV SV MOD A4C:     126.0 ml LV SV MOD BP:      66.9 ml RIGHT VENTRICLE RV S prime:     10.50 cm/s TAPSE (M-mode): 1.9 cm LEFT ATRIUM             Index       RIGHT ATRIUM           Index LA diam:        3.50 cm 1.97 cm/m  RA Area:     11.20 cm LA Vol (A2C):   52.3 ml 29.43 ml/m RA Volume:   21.00 ml  11.82 ml/m LA Vol (A4C):   34.0 ml 19.13 ml/m LA Biplane Vol: 42.2 ml 23.74 ml/m  AORTIC VALVE AV Area (Vmax):    1.36 cm AV Area (Vmean):   1.32 cm AV Area (VTI):     1.34 cm AV Vmax:           208.00 cm/s AV Vmean:          133.000 cm/s AV VTI:            0.459 m AV Peak Grad:      17.3 mmHg AV Mean Grad:      9.0 mmHg LVOT Vmax:         99.80 cm/s LVOT Vmean:        62.000 cm/s LVOT VTI:          0.217 m LVOT/AV VTI ratio: 0.47  AORTA Ao Root diam: 2.75 cm MITRAL VALVE MV Area (PHT): 1.94 cm              SHUNTS MV PHT:        113.39 msec           Systemic VTI:  0.22 m MV Decel Time: 391 msec              Systemic Diam: 1.90 cm MV E velocity: 64.00 cm/s  103 cm/s MV A velocity: 106.00 cm/s 70.3 cm/s MV E/A ratio:  0.60        1.5  Rozann Lesches MD Electronically signed by Rozann Lesches MD Signature Date/Time: 08/06/2019/12:53:37 PM    Final     Microbiology: Recent Results (from the past 240 hour(s))  SARS CORONAVIRUS 2 (TAT 6-24 HRS) Nasopharyngeal Nasopharyngeal Swab     Status: None   Collection Time: 08/22/19  4:40 PM   Specimen: Nasopharyngeal Swab  Result Value Ref Range Status   SARS Coronavirus 2 NEGATIVE NEGATIVE Final    Comment: (NOTE) SARS-CoV-2 target nucleic acids are NOT DETECTED. The SARS-CoV-2 RNA is generally detectable in upper and lower respiratory specimens during the acute phase of infection. Negative results do not preclude SARS-CoV-2 infection, do not rule out  co-infections with other pathogens, and should not be used as the sole basis for treatment or other patient management decisions. Negative  results must be combined with clinical observations, patient history, and epidemiological information. The expected result is Negative. Fact Sheet for Patients: SugarRoll.be Fact Sheet for Healthcare Providers: https://www.woods-mathews.com/ This test is not yet approved or cleared by the [place] FDA and  has been authorized for detection and/or diagnosis of SARS-CoV-2 by FDA under an Emergency Use Authorization (EUA). This EUA will remain  in effect (meaning this test can be used) for the duration of the COVID-19 declaration under Section 56 4(b)(1) of the Act, 21 U.S.C. section 360bbb-3(b)(1), unless the authorization is terminated or revoked sooner. Performed at [facility], [place] [address], [place], [place] [address]      Labs: Basic Metabolic Panel: Recent Labs  Lab 08/22/19 1513 08/24/19 0309  NA 136 136  K 4.2 4.4  CL 102 105  CO2 25 21*  GLUCOSE 132* 148*  BUN 28* 26*  CREATININE 1.48* 1.52*  CALCIUM 9.0 8.6*   Liver Function Tests: Recent Labs  Lab 08/22/19 1513  AST 27  ALT 30  ALKPHOS 100  BILITOT 0.4  PROT 6.9  ALBUMIN 4.0   No results for input(s): LIPASE, AMYLASE in the last 168 hours. No results for input(s): AMMONIA in the last 168 hours. CBC: Recent Labs  Lab 08/22/19 1513 08/24/19 0309  WBC 7.4 6.7  NEUTROABS 5.6  --   HGB 11.5* 10.1*  HCT 37.2* 31.1*  MCV 100.5* 96.3  PLT 133* 106*   Cardiac Enzymes: No results for input(s): CKTOTAL, CKMB, CKMBINDEX, TROPONINI in the last 168 hours. BNP: BNP (last 3 results) No results for input(s): BNP in the last 8760 hours.  ProBNP (last 3 results) No results for input(s): PROBNP in the last 8760 hours.  CBG: Recent Labs  Lab 08/22/19 2148 08/23/19 1220 08/23/19 1840 08/24/19 0728  GLUCAP 188* 104* 133* 200*       Signed:  Rowe Clack N  [facility] 08/24/2019, 10:26 AM

## 2019-08-24 NOTE — Progress Notes (Signed)
CARDIAC REHAB PHASE I   PRE:  Rate/Rhythm: 79 SR  BP:  Sitting: 125/72        SaO2: 95 RA  MODE:  Ambulation: 470 ft   POST:  Rate/Rhythm: 104 ST  BP:  Sitting: 140/64        SaO2: 95 RA  0820 - 0917  Pt ambulated 470 ft independently with no complaints. Pt given stent card. Pt educated on stent, MI booklet, diet ([place]), exercise, restrictions. Pt referred to CRPII at [facility]. Pt in bed with call bell and phone within reach.   Philis Kendall, MS, ACSM CEP 08/24/2019 9:10 AM

## 2019-08-24 NOTE — Progress Notes (Addendum)
Progress Note  Patient Name: Manuel Olson Date of Encounter: 08/24/2019  Primary Cardiologist: Rozann Lesches, MD   Subjective   Just completed walk with cardiac rehab.  He feels great no chest pain or SOB.  His wrist did bleed last pm.   Inpatient Medications    Scheduled Meds: . aspirin  81 mg Oral Daily  . atorvastatin  20 mg Oral Daily  . clopidogrel  75 mg Oral Q breakfast  . heparin  5,000 Units Subcutaneous Q8H  . insulin glargine  9 Units Subcutaneous Daily  . lipase/protease/amylase  36,000 Units Oral TID WC  . losartan  12.5 mg Oral Daily  . sodium chloride flush  3 mL Intravenous Q12H   Continuous Infusions: . sodium chloride    . nitroGLYCERIN Stopped (08/23/19 2006)   PRN Meds: sodium chloride, acetaminophen, nitroGLYCERIN, ondansetron (ZOFRAN) IV, ondansetron **OR** [DISCONTINUED] ondansetron (ZOFRAN) IV, oxyCODONE, polyethylene glycol, sodium chloride flush   Vital Signs    Vitals:   08/23/19 2104 08/23/19 2204 08/23/19 2304 08/24/19 0519  BP: (!) 115/34 121/64 134/65 140/73  Pulse: 65 63 62 64  Resp:    16  Temp:    98.3 F (36.8 C)  TempSrc:    Oral  SpO2: 94% 94% 96% 94%  Weight:    59.9 kg  Height:        Intake/Output Summary (Last 24 hours) at 08/24/2019 0850 Last data filed at 08/23/2019 1221 Gross per 24 hour  Intake 47.6 ml  Output 150 ml  Net -102.4 ml   Last 3 Weights 08/24/2019 08/23/2019 08/22/2019  Weight (lbs) 132 lb 135 lb 2.3 oz 135 lb 2.3 oz  Weight (kg) 59.875 kg 61.3 kg 61.3 kg      Telemetry    SR to SB - Personally Reviewed  ECG    SR with 1st degree AV block and HR 60, Qtc 462 and T wave inversion Inferior leads.  - Personally Reviewed  Physical Exam  Exam per Dr. Harl Bowie  GEN: No acute distress.   Neck: No JVD Cardiac: RRR, no murmurs, rubs, or gallops.  Respiratory: Clear to auscultation bilaterally. GI: Soft, nontender, non-distended  MS: No edema; No deformity. Neuro:  Nonfocal  Psych:  Normal affect   Labs    High Sensitivity Troponin:   Recent Labs  Lab 08/22/19 1513 08/22/19 1707 08/22/19 2036  TROPONINIHS 34* 121* 665*      Chemistry Recent Labs  Lab 08/22/19 1513 08/24/19 0309  NA 136 136  K 4.2 4.4  CL 102 105  CO2 25 21*  GLUCOSE 132* 148*  BUN 28* 26*  CREATININE 1.48* 1.52*  CALCIUM 9.0 8.6*  PROT 6.9  --   ALBUMIN 4.0  --   AST 27  --   ALT 30  --   ALKPHOS 100  --   BILITOT 0.4  --   GFRNONAA 46* 44*  GFRAA 53* 51*  ANIONGAP 9 10     Hematology Recent Labs  Lab 08/22/19 1513 08/24/19 0309  WBC 7.4 6.7  RBC 3.70* 3.23*  HGB 11.5* 10.1*  HCT 37.2* 31.1*  MCV 100.5* 96.3  MCH 31.1 31.3  MCHC 30.9 32.5  RDW 13.9 13.9  PLT 133* 106*    BNPNo results for input(s): BNP, PROBNP in the last 168 hours.   DDimer No results for input(s): DDIMER in the last 168 hours.   Radiology    CARDIAC CATHETERIZATION  Addendum Date: 08/23/2019    A stent was successfully  placed.   Non-ST elevation myocardial infarction due to de novo plaque rupture in the distal RCA.  Complicated but ultimately successful distal RCA stent using a 22 x 2.75 Onyx postdilated to 3.0 decreasing 99% stenosis to 0% with TIMI grade III flow.  Left main is widely patent  LAD contains a patent mid vessel stent.  Proximal to the stent is eccentric 50% narrowing.  First diagonal contains a stent that is widely patent.  A moderate size ramus intermedius contains 50 to 75% stenosis in its mid segment.  Circumflex is relatively small and is widely patent.  The SAPIEN TAVR valve was not crossed. RECOMMENDATIONS:  Continue aspirin and Plavix.  Potentially eligible for discharge tomorrow.  Kidney function needs to be monitored closely and if there is a rise in creatinine tomorrow morning he should not be discharged but she did have a creatinine done 48 hours after the procedure which would be Sunday morning.   Result Date: 08/23/2019  Non-ST elevation myocardial  infarction due to de novo plaque rupture in the distal RCA.  Complicated but ultimately successful distal RCA stent using a 22 x 2.75 Onyx postdilated to 3.0 decreasing 99% stenosis to 0% with TIMI grade III flow.  Left main is widely patent  LAD contains a patent mid vessel stent.  Proximal to the stent is eccentric 50% narrowing.  First diagonal contains a stent that is widely patent.  A moderate size ramus intermedius contains 50 to 75% stenosis in its mid segment.  Circumflex is relatively small and is widely patent.  The SAPIEN TAVR valve was not crossed. RECOMMENDATIONS:  Continue aspirin and Plavix.  Potentially eligible for discharge tomorrow.  Kidney function needs to be monitored closely and if there is a rise in creatinine tomorrow morning he should not be discharged but she did have a creatinine done 48 hours after the procedure which would be Sunday morning.  DG Chest Portable 1 View  Result Date: 08/22/2019 CLINICAL DATA:  Chest pain since noon today, LEFT side chest pressure radiating down LEFT arm, history diabetes mellitus, renal cell carcinoma, hypertension, former smoker, coronary artery disease, pancreatitis EXAM: PORTABLE CHEST 1 VIEW COMPARISON:  Portable exam 1352 hours compared to 01/14/2019 FINDINGS: Normal heart size post TAVR. Mediastinal contours and pulmonary vascularity normal. Atherosclerotic calcification aorta. Emphysematous and bronchitic changes consistent with COPD. Two nodular foci are identified at the lateral RIGHT chest on one view, with only a single nodule seen on the second view unchanged since 2019 likely a calcified granuloma; second nodule likely reflects a nipple shadow. Lungs clear. No pulmonary infiltrate, pleural effusion, or pneumothorax. Bones demineralized with scattered degenerative changes of the thoracic spine. IMPRESSION: COPD changes with stable nodular density at the lateral mid RIGHT chest. Probable RIGHT nipple shadow seen on single view but  not seen on second. No acute abnormalities. Electronically Signed   By: Lavonia Dana M.D.   On: 08/22/2019 14:05    Cardiac Studies   08/23/19  cardiac cath and PCI    A stent was successfully placed.    Non-ST elevation myocardial infarction due to de novo plaque rupture in the distal RCA.  Complicated but ultimately successful distal RCA stent using a 22 x 2.75 Onyx postdilated to 3.0 decreasing 99% stenosis to 0% with TIMI grade III flow.  Left main is widely patent  LAD contains a patent mid vessel stent.  Proximal to the stent is eccentric 50% narrowing.  First diagonal contains a stent that is widely patent.  A moderate size ramus intermedius contains 50 to 75% stenosis in its mid segment.  Circumflex is relatively small and is widely patent.  The SAPIEN TAVR valve was not crossed.  RECOMMENDATIONS:   Continue aspirin and Plavix.  Potentially eligible for discharge tomorrow.  Kidney function needs to be monitored closely and if there is a rise in creatinine tomorrow morning he should not be discharged but she did have a creatinine done 48 hours after the procedure which would be Sunday morning.  Diagnostic Dominance: Right  Intervention    Patient Profile     73  y.o. male with a hx of known CAD s/p STEMI 11/2016 with DES to LAD, D1, RCA, HTN, severe AS s/p TAVR, and pancreatic and renal cancer in remission now admitted with NSTEMI and PCI.   Assessment & Plan    NSTEMI with pk troponin 665. Cardiac Cath  S/p stent complicated but ultimately successful distal RCA stent using a 22 x 2.75 Onyx postdilated to 3.0 decreasing 99% stenosis to 0% with TIMI grade III flow.  --follow up in 7-10 days in [place]  --cardiac rehab  -- ASA plavix, statin and no BB due to HR  CAD with hx of prior stents to LAD   Hx of severe AS s/p TAVR   HLD on statin lipitor 20, last LDL is 34 in 09/2018   HTN  Mildly elevated this AM prior to meds ? Resume losartan  DM-2 on  insulin, will resume at discharge   CKD-3  Losartan was on hold for cath.   Hx of pancreatic and renal cancer in remission     For questions or updates, please contact [place] Please consult [email or web address] for contact info under        Signed, Cecilie Kicks, NP  08/24/2019, 8:50 AM    Attending note Patient seen and discussed with NP Dorene Ar, I agree with her documentation. Admitted with NSTEMI, s/p DES to RCA. Echo LVEF 50%, normal TAVR valve. Medical therapy with ASA 81, atorva 20, plavix 75, losartan 12.5. No beta blocker due to low HRs at times. Post cath labs are stable, right radial site looks good. No symptoms this AM, ok for discharge from cardiac standpoint, we will arrange f/u   Zandra Abts MD

## 2019-08-26 ENCOUNTER — Telehealth: Payer: Self-pay | Admitting: Cardiology

## 2019-08-26 ENCOUNTER — Encounter: Payer: Self-pay | Admitting: Cardiology

## 2019-08-26 MED FILL — Nitroglycerin IV Soln 100 MCG/ML in D5W: INTRA_ARTERIAL | Qty: 10 | Status: AC

## 2019-08-27 ENCOUNTER — Inpatient Hospital Stay: Payer: Medicare Other | Attending: Nurse Practitioner | Admitting: Nurse Practitioner

## 2019-08-27 ENCOUNTER — Other Ambulatory Visit: Payer: Self-pay | Admitting: *Deleted

## 2019-08-27 ENCOUNTER — Telehealth: Payer: Self-pay | Admitting: Nurse Practitioner

## 2019-08-27 ENCOUNTER — Encounter: Payer: Self-pay | Admitting: Nurse Practitioner

## 2019-08-27 ENCOUNTER — Encounter: Payer: Self-pay | Admitting: *Deleted

## 2019-08-27 ENCOUNTER — Other Ambulatory Visit: Payer: Self-pay

## 2019-08-27 ENCOUNTER — Inpatient Hospital Stay: Payer: Medicare Other

## 2019-08-27 VITALS — BP 122/63 | HR 72 | Temp 98.3°F | Resp 17 | Ht 70.0 in | Wt 137.4 lb

## 2019-08-27 DIAGNOSIS — I951 Orthostatic hypotension: Secondary | ICD-10-CM | POA: Insufficient documentation

## 2019-08-27 DIAGNOSIS — D6481 Anemia due to antineoplastic chemotherapy: Secondary | ICD-10-CM | POA: Insufficient documentation

## 2019-08-27 DIAGNOSIS — Z8507 Personal history of malignant neoplasm of pancreas: Secondary | ICD-10-CM | POA: Insufficient documentation

## 2019-08-27 DIAGNOSIS — Z905 Acquired absence of kidney: Secondary | ICD-10-CM | POA: Diagnosis not present

## 2019-08-27 DIAGNOSIS — C250 Malignant neoplasm of head of pancreas: Secondary | ICD-10-CM

## 2019-08-27 DIAGNOSIS — Z9221 Personal history of antineoplastic chemotherapy: Secondary | ICD-10-CM | POA: Insufficient documentation

## 2019-08-27 DIAGNOSIS — D696 Thrombocytopenia, unspecified: Secondary | ICD-10-CM | POA: Insufficient documentation

## 2019-08-27 DIAGNOSIS — I255 Ischemic cardiomyopathy: Secondary | ICD-10-CM

## 2019-08-27 DIAGNOSIS — R197 Diarrhea, unspecified: Secondary | ICD-10-CM | POA: Diagnosis not present

## 2019-08-27 DIAGNOSIS — I10 Essential (primary) hypertension: Secondary | ICD-10-CM | POA: Insufficient documentation

## 2019-08-27 DIAGNOSIS — T451X5A Adverse effect of antineoplastic and immunosuppressive drugs, initial encounter: Secondary | ICD-10-CM | POA: Diagnosis not present

## 2019-08-27 DIAGNOSIS — Z87891 Personal history of nicotine dependence: Secondary | ICD-10-CM | POA: Insufficient documentation

## 2019-08-27 DIAGNOSIS — N289 Disorder of kidney and ureter, unspecified: Secondary | ICD-10-CM | POA: Diagnosis not present

## 2019-08-27 DIAGNOSIS — E119 Type 2 diabetes mellitus without complications: Secondary | ICD-10-CM | POA: Insufficient documentation

## 2019-08-27 LAB — CMP (CANCER CENTER ONLY)
ALT: 25 U/L (ref 0–44)
AST: 30 U/L (ref 15–41)
Albumin: 3.7 g/dL (ref 3.5–5.0)
Alkaline Phosphatase: 118 U/L (ref 38–126)
Anion gap: 10 (ref 5–15)
BUN: 27 mg/dL — ABNORMAL HIGH (ref 8–23)
CO2: 25 mmol/L (ref 22–32)
Calcium: 9 mg/dL (ref 8.9–10.3)
Chloride: 103 mmol/L (ref 98–111)
Creatinine: 1.67 mg/dL — ABNORMAL HIGH (ref 0.61–1.24)
GFR, Est AFR Am: 46 mL/min — ABNORMAL LOW (ref >60)
GFR, Estimated: 39 mL/min — ABNORMAL LOW (ref >60)
Glucose, Bld: 142 mg/dL — ABNORMAL HIGH (ref 70–99)
Potassium: 4.5 mmol/L (ref 3.5–5.1)
Sodium: 138 mmol/L (ref 135–145)
Total Bilirubin: 0.4 mg/dL (ref 0.3–1.2)
Total Protein: 6.5 g/dL (ref 6.5–8.1)

## 2019-08-27 LAB — CBC WITH DIFFERENTIAL (CANCER CENTER ONLY)
Abs Immature Granulocytes: 0.02 10*3/uL (ref 0.00–0.07)
Basophils Absolute: 0 10*3/uL (ref 0.0–0.1)
Basophils Relative: 0 %
Eosinophils Absolute: 0.1 10*3/uL (ref 0.0–0.5)
Eosinophils Relative: 2 %
HCT: 34.4 % — ABNORMAL LOW (ref 39.0–52.0)
Hemoglobin: 10.8 g/dL — ABNORMAL LOW (ref 13.0–17.0)
Immature Granulocytes: 0 %
Lymphocytes Relative: 17 %
Lymphs Abs: 1 10*3/uL (ref 0.7–4.0)
MCH: 30.9 pg (ref 26.0–34.0)
MCHC: 31.4 g/dL (ref 30.0–36.0)
MCV: 98.6 fL (ref 80.0–100.0)
Monocytes Absolute: 0.8 10*3/uL (ref 0.1–1.0)
Monocytes Relative: 14 %
Neutro Abs: 4 10*3/uL (ref 1.7–7.7)
Neutrophils Relative %: 67 %
Platelet Count: 113 10*3/uL — ABNORMAL LOW (ref 150–400)
RBC: 3.49 MIL/uL — ABNORMAL LOW (ref 4.22–5.81)
RDW: 14.2 % (ref 11.5–15.5)
WBC Count: 5.9 10*3/uL (ref 4.0–10.5)
nRBC: 0 % (ref 0.0–0.2)

## 2019-08-27 NOTE — Progress Notes (Addendum)
[place] OFFICE PROGRESS NOTE   Diagnosis: Pancreas cancer  INTERVAL HISTORY:   Manuel Olson returns as scheduled.  He was hospitalized 08/22/2019 through 08/24/2019 with an MI.  He underwent a catheterization procedure with a new stent placed.  Overall he feels well.  He denies abdominal pain.  He reports a good appetite but does note difficulty gaining weight.  Weight is stable.  No diarrhea.  No nausea or vomiting.  Objective:  Vital signs in last 24 hours:  Blood pressure 122/63, pulse 72, temperature 98.3 F (36.8 C), temperature source Temporal, resp. rate 17, height 5' 10"  (1.778 m), weight 137 lb 6.4 oz (62.3 kg), SpO2 100 %.    HEENT: Neck without mass. Lymphatics: No palpable cervical, supraclavicular or axillary lymph nodes. Resp: Lungs clear bilaterally. GI: Abdomen soft and nontender.  No hepatomegaly.  No mass. Vascular: No leg edema.  Lab Results:  Lab Results  Component Value Date   WBC 5.9 08/27/2019   HGB 10.8 (L) 08/27/2019   HCT 34.4 (L) 08/27/2019   MCV 98.6 08/27/2019   PLT 113 (L) 08/27/2019   NEUTROABS 4.0 08/27/2019    Imaging:  No results found.  Medications: I have reviewed the patient's current medications.  Assessment/Plan: 1. Adenocarcinoma of the pancreas head/neck, status post an EUS biopsy 08/02/2017 confirming adenocarcinoma ? EUS 08/02/2017-pancreas head/neck mass, abutment of the portal vein, no peripancreatic adenopathy ? MRI abdomen 07/27/2017-head/neck pancreas mass with mass-effect on the portal splenic confluence, no involvement of the celiac axis or superior mesenteric artery, no evidence of metastatic disease ? PET scan 08/31/2017-very hypermetabolism at the pancreatic head mass, no evidence of metastatic disease. The isolated right lower lobe nodule below PET resolution, left sided renal mass with decreased activity relative to the normal adjacent kidney ? Cycle 1 FOLFIRINOX 09/16/2017 ? Cycle 2 FOLFOX  10/16/2017 ? Cycle 3 FOLFOX 10/30/2017 ? Cycle 4 FOLFIRINOX 11/14/2017 (irinotecan resumed with a dose reduction) ? Cycle 5 FOLFIRINOX3/20/2019 (Oxaliplatin dose reduced due to thrombocytopenia) ? Cycle 6 FOLFIRINOX4/11/2017 ? CTs 12/25/2017-stable right lower lobe nodule, decrease in size of the pancreas head mass, stable left kidney mass ? Pancreaticoduodenectomy and left nephrectomy 02/19/2018,[id number], 2/18 lymph nodes positive, lymphovascular and perineural invasion present, mild to moderate treatment effect, tumor involved adherent portal vein with negative portal vein margins ? Cycle 1 adjuvant gemcitabine/Abraxane 04/25/2018 ? Cycle 2 adjuvant gemcitabine/Abraxane 05/16/2018 ? Cycle 3 adjuvant gemcitabine/Abraxane 05/31/2018 ? Cycle 4 adjuvant gemcitabine/Abraxane 06/13/2018 ? Cycle 5 adjuvant gemcitabine/Abraxane 06/27/2018 ? Cycle 6 adjuvant gemcitabine/Abraxane 07/11/2018  2. 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  5.Diabetes  6.History of tobacco use  7.Hypertension  8.Port-A-Cath placement 09/15/2017, Dr. Barry Dienes  9. Diarrhea-potentially related to pancreas insufficiency, he began a trial of pancreatic enzyme replacement 05/16/2018: Diarrhea resolved  10. 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  Mild thrombocytopenia-chronic   Disposition: Manuel Olson remains in clinical remission from pancreas cancer.  We will follow-up on the CA 19-9 from today.    We reviewed the  CBC from today.  He has stable mild thrombocytopenia.  He will return for lab and follow-up in 4 months.  He will contact the office in the interim with any problems.  Patient seen with Dr. Benay Spice.  Manuel Olson ANP/GNP-BC   08/27/2019  11:00 AM  This was a shared visit with Manuel Olson.  Manuel Olson is in clinical remission from pancreas cancer.  He will return for an office visit.    Manuel Manson, MD

## 2019-08-27 NOTE — Progress Notes (Signed)
Completed patient assistance form for [place] and MD signed. Mailed to patient's home as requested.

## 2019-08-27 NOTE — Patient Outreach (Signed)
[facility]) Care Management  08/27/2019  Manuel Olson 27-Jul-1946 [id number]   RED ON EMMI ALERT - General Discharge Day # 1 Date: 08/26/2019 Red Alert Reason: No follow up scheduled   Outreach attempt #1, successful.  Referral received from care management assistant as member was recently admitted to the hospital for CAD needing stent placement.  Per chart, he also has history of HTN,  Controlled Diabetes (last A1C was 7.30, hyperlipidemia, and cancer of the pancreatic head (in remission).    Call placed to member, wife answered phone and stated he was not available.  DPR on chart to speak with wife, she verified member's identity.  This care manager introduced self and stated purpose of call.  [facility] care management services explained.  She report that member was discharged over the weekend and she had not had the chance to schedule follow up appointments prior to the [facility] call.  She has since been able to secure appointment with cardiology for next week.  Denies the need for transportation, she will take him.  Does report that member's blood pressure was on the low side (12/13 - 99/59 & 12/14 - 106/68), has not taken Losartan due to this.  BP was 122/63 today during cardiology visit without meds.  She will continue to hold and monitor BP, recording readings to report to cardiology to make decision or restarting Losartan.    Discussed further involvement with [facility], she declines stating member is doing very well and does not have any present needs.  She does agree to contact this care manager in the future if there are any changes.  Plan: RN CM will send successful outreach letter, will not open case at this time.  Valente David, [place], MSN [place] [phone]

## 2019-08-27 NOTE — Telephone Encounter (Signed)
Scheduled appt per 12/15 los.  Printed calendar and avs.

## 2019-08-27 NOTE — Telephone Encounter (Signed)
Called pt., no answer. Left messgae for pt to return call.

## 2019-08-28 ENCOUNTER — Telehealth: Payer: Self-pay | Admitting: *Deleted

## 2019-08-28 LAB — CANCER ANTIGEN 19-9: CA 19-9: 8 U/mL (ref 0–35)

## 2019-08-28 NOTE — Telephone Encounter (Signed)
-----   Message from Desma Paganini sent at 08/27/2019  3:11 PM EST ----- Scheduled for TOC apt on Wed 12/23 w/ B. Strader   ----- Message ----- From: Bernita Raisin, RN Sent: 08/26/2019   8:04 AM EST To: Desma Paganini   ----- Message ----- From: Isaiah Serge, NP Sent: 08/24/2019   9:15 AM EST To: Cristi Loron Scheduling  Pt needs 7-10 day TOC appt with Dr. Domenic Polite or APP for NSTEMI and new stent.  Thanks

## 2019-08-28 NOTE — Telephone Encounter (Signed)
Spoke with wife [name] regarding BP. She reports that pt is having dizziness while taking Losartan. And c/o decreased BP. BP are as follows.  08/25/19 109/64  08/26/19 106/68  08/27/19 122/65 with out losartan  08/28/19 116/70 with out losartan  Please advise.

## 2019-08-28 NOTE — Telephone Encounter (Signed)
He was discharged in the hospital on low-dose losartan 12.5 mg daily.  If he is having dizziness with blood pressures in the 100-110 range, would hold losartan for now.  This can be reevaluated at follow-up.

## 2019-08-28 NOTE — Telephone Encounter (Signed)
Patient contacted regarding discharge from [facility] on 08/24/19.  Patient understands to follow up with provider Strader on 09/04/19 at 1:30 pm at [facility]. Patient understands discharge instructions? Yes Patient understands medications and regiment? Yes Patient understands to bring all medications to this visit? Yes   Postop Surgical Patients:                What is your wound status? Any signs/ symptoms of infection (Temp, redness/ red streaks, swelling, purulent drainage, foul odor or smell)? .             Please do not place any creams/ lotions/ or antibiotic ointment on any surgical incisions/ wounds without physician approval. .             Do you have any questions about your medications?  All medications (except pain medications) are to be filled by your Cardiologist AFTER your first post op       appointment with them.  Are you taking your pain medication? .             How is your pain controlled? Pain level? .             If you require a refill on pain medications, know that the same medication/ amount may not be prescribed or a refill may not be given.  Please contact your pharmacy for refill requests.  .             Do you have help at home with ADL's?  If you have home health, have you been contacted or seen by the agency? .             Please refer to your Pre/post surgery booklet, there is a lot of useful information in it that may answer any questions you may have. .             Please note that it is ok to remove your surgical dressing, shower (soap/ water), and pat the incision dry.  For surgery related questions staff will route a phone note to CV DIV TCTS [facility] pool  [facility] [place], [place] [address]  For nonsurgery patients please delete the surgery questions.  For patients

## 2019-08-28 NOTE — Telephone Encounter (Signed)
Wife notified and will continue to monitor.

## 2019-08-29 ENCOUNTER — Telehealth: Payer: Self-pay | Admitting: Cardiology

## 2019-08-29 NOTE — Telephone Encounter (Signed)
Please give pt's wife a call concerning pt's BP and his losartan (COZAAR) 25 MG tablet [[id number]]

## 2019-08-29 NOTE — Telephone Encounter (Signed)
Called pt. No answer, unable to leave message.

## 2019-09-02 NOTE — Telephone Encounter (Signed)
Returned pt's wifes call. She stated that she was told to call with blood pressures over 130. On Thursday his blood pressures was 138/71,133/69. The rest were normal. She also questions at what point does she give him the losartan? She wants to be sure he is taking medications correctly in the event that we are closed. Pt has an appointment with Bernerd Pho, PA-C on Wednesday. I informed wife to advise pt at appointment.

## 2019-09-02 NOTE — Telephone Encounter (Signed)
Returned call. Pt will bring blood pressures with him to visit.

## 2019-09-02 NOTE — Telephone Encounter (Signed)
     Please have them bring his BP log to his appointment as it appears Losartan was just discontinued last week due to hypotension so will need to assess the overall trend.   Signed, Erma Heritage, PA-C 09/02/2019, 2:36 PM Pager: [phone]

## 2019-09-03 NOTE — Progress Notes (Signed)
Cardiology Office Note    Date:  09/04/2019   ID:  Manuel Olson, Manuel Olson August 07, 1946, MRN [id number]  PCP:  [name], MD  Cardiologist: Rozann Lesches, MD    Chief Complaint  Patient presents with  . Hospitalization Follow-up    s/p NSTEMI    History of Present Illness:    BLAIKE Olson is a 73 y.o. male with past medical history of CAD (s/p DES to LAD, DES to D1, and DESx2 to RCA in 2018), severe aortic stenosis (s/p TAVR in 08/2017), history of cardiomyopathy, pancreatic cancer (s/p Whipple procedure), HTN, and HLD who presents to the office today for hospital follow-up.   He most recently presented to [facility] ED on 08/23/2019 for evaluation of substernal chest discomfort which radiated into his left shoulder. His EKG showed T wave abnormalities and initial troponin was 121 with repeat value of 665 which prompted initiation of Heparin and transfer to [facility] for a cardiac catheterization. This was performed by Dr. Tamala Julian and showed a de novo plaque rupture in the distal RCA and he underwent successful PCI using a 22 x 2.75 Onyx DES. Stents along the LAD and D1 were patent with 50-75% RI stenosis. He was continued on ASA and Plavix. Was continued on Atorvastatin and Losartan but BB was not added given bradycardia at times.   He called the office several days later reporting dizziness and SBP had been in the low 100's, therefore he was informed to hold Losartan at that time.  In talking with the patient today, he reports overall doing well since his hospitalization and denies any repeat episodes of chest pain. He denies any dyspnea on exertion, orthopnea, PND or lower extremity edema. He reports he was having dizziness with episodes of hypoglycemia in the past but feels like his dizziness has improved with discontinuation of Losartan. He has been keeping a detailed BP log at home and his blood pressure this morning was 102/68. Initial BP upon walking into the office was  139/78 but at 118/62 on recheck.  Past Medical History:  Diagnosis Date  . Arthritis   . CAD (coronary artery disease)    a. 11/2016 in [place]: STEMI s/p DES to mid LAD, DES to diagonal, DES x 2 to proximal and mid RCA b. 08/2019: NSTEMI with DES to distal RCA  . Essential hypertension   . Full dentures   . GERD (gastroesophageal reflux disease)   . History of stroke   . Hyperlipidemia   . Myocardial infarction ([place])   . Pancreatic cancer ([place])   . Pneumonia   . Renal cell carcinoma ([place])   . S/P TAVR (transcatheter aortic valve replacement)    a. 08/2017:  Edwards Sapien 3 THV (size 26 mm, model #9600CM26A , serial # [id number])  . Severe aortic stenosis    a. 08/2017: s/p TAVR by Dr. Angelena Form and Dr. Cyndia Bent.   . Type 2 diabetes mellitus ([place])   . Wears glasses   . Wears hearing aid     Past Surgical History:  Procedure Laterality Date  . APPENDECTOMY    . CARDIAC CATHETERIZATION    . CORONARY ANGIOGRAPHY N/A 08/23/2019   Procedure: CORONARY ANGIOGRAPHY;  Surgeon: Belva Crome, MD;  Location: [place] [facility];  Service: Cardiovascular;  Laterality: N/A;  . CORONARY STENT INTERVENTION N/A 08/23/2019   Procedure: CORONARY STENT INTERVENTION;  Surgeon: Belva Crome, MD;  Location: [place] [facility];  Service: Cardiovascular;  Laterality: N/A;  .  CORONARY STENT PLACEMENT     x 4 in March 2018  . EUS N/A 08/02/2017   Procedure: UPPER ENDOSCOPIC ULTRASOUND (EUS) RADIAL;  Surgeon: [name], MD;  Location: [facility];  Service: Endoscopy;  Laterality: N/A;  . EYE SURGERY Left   . HAND SURGERY Bilateral   . HERNIA REPAIR Right   . LAPAROSCOPY N/A 02/19/2018   Procedure: DIAGNOSTIC LAPAROSCOPY, ERAS PATHWAY;  Surgeon: [name], MD;  Location: [place];  Service: General;  Laterality: N/A;  . MULTIPLE TOOTH EXTRACTIONS    . NEPHRECTOMY Left 02/19/2018   Procedure: OPEN LEFT RADICAL NEPHRECTOMY;  Surgeon: Cleon Gustin, MD;  Location: [place];  Service: Urology;   Laterality: Left;  . PORT-A-CATH REMOVAL N/A 06/13/2019   Procedure: PORT REMOVAL;  Surgeon: [name], MD;  Location: [place];  Service: General;  Laterality: N/A;  . PORTACATH PLACEMENT N/A 09/15/2017   Procedure: INSERTION PORT-A-CATH;  Surgeon: [name], MD;  Location: [place];  Service: General;  Laterality: N/A;  . RIGHT/LEFT HEART CATH AND CORONARY ANGIOGRAPHY N/A 07/05/2017   Procedure: RIGHT/LEFT HEART CATH AND CORONARY ANGIOGRAPHY;  Surgeon: [name], MD;  Location: [place] [facility];  Service: Cardiovascular;  Laterality: N/A;  . SHOULDER ARTHROSCOPY WITH ROTATOR CUFF REPAIR Left   . TEE WITHOUT CARDIOVERSION N/A 08/22/2017   Procedure: TRANSESOPHAGEAL ECHOCARDIOGRAM (TEE);  Surgeon: Burnell Blanks, MD;  Location: [place];  Service: Open Heart Surgery;  Laterality: N/A;  . TRANSCATHETER AORTIC VALVE REPLACEMENT, TRANSFEMORAL N/A 08/22/2017   Procedure: TRANSCATHETER AORTIC VALVE REPLACEMENT, TRANSFEMORAL;  Surgeon: Burnell Blanks, MD;  Location: [place];  Service: Open Heart Surgery;  Laterality: N/A;  . WHIPPLE PROCEDURE N/A 02/19/2018   Procedure: WHIPPLE PROCEDURE;  Surgeon: [name], MD;  Location: [place];  Service: General;  Laterality: N/A;    Current Medications: Outpatient Medications Prior to Visit  Medication Sig Dispense Refill  . acetaminophen (TYLENOL) 325 MG tablet Take 650 mg by mouth every 6 (six) hours as needed (for pain.).    [name] aspirin 81 MG chewable tablet Chew 1 tablet (81 mg total) by mouth daily. 30 tablet 2  . atorvastatin (LIPITOR) 20 MG tablet TAKE 1 TABLET BY MOUTH ONCE DAILY. 90 tablet 3  . clopidogrel (PLAVIX) 75 MG tablet Take 1 tablet (75 mg total) by mouth daily with breakfast. 30 tablet 1  . CREON 36000 units CPEP capsule TAKE 1 CAPSULE BY MOUTH BEFORE EACH MEAL AND SNACK. TAKE UP TO 6 CAPSULES PER DAY. (Patient taking differently: Take 36,000 Units by mouth See admin instructions. Take 1 capsule before  each meal and with snacks.) 90 capsule 0  . Insulin Glargine (BASAGLAR KWIKPEN) 100 UNIT/ML SOPN Inject 0.1 mLs (10 Units total) into the skin at bedtime. (Patient taking differently: Inject 9 Units into the skin daily. ) 5 pen 5  . insulin lispro (HUMALOG KWIKPEN) 100 UNIT/ML KwikPen Inject 3-5 units 3 times a day with meals. (Patient taking differently: Inject 5 Units into the skin 3 (three) times daily before meals. Inject 3-5 units 3 times a day with meals.) 15 mL 11  . Insulin Pen Needle (PEN NEEDLES) 31G X 6 MM MISC Use as directed to check blood sugar 4 times a day. 400 each 2  . nitroGLYCERIN (NITROSTAT) 0.4 MG SL tablet Place 1 tablet (0.4 mg total) under the tongue every 5 (five) minutes as needed for chest pain. 20 tablet 0  . oxyCODONE (OXY IR/ROXICODONE) 5 MG immediate release tablet  Take 1 tablet (5 mg total) by mouth every 6 (six) hours as needed for severe pain. 10 tablet 0  . losartan (COZAAR) 25 MG tablet Take 0.5 tablets (12.5 mg total) by mouth daily. 45 tablet 3   No facility-administered medications prior to visit.     Allergies:   Patient has no known allergies.   Social History   Socioeconomic History  . Marital status: Married    Spouse name: Not on file  . Number of children: Not on file  . Years of education: Not on file  . Highest education level: Not on file  Occupational History  . Not on file  Tobacco Use  . Smoking status: Former Smoker    Packs/day: 1.00    Years: 57.00    Pack years: 57.00    Types: Cigarettes    Start date: 09/12/1958    Quit date: 04/12/2016    Years since quitting: 3.3  . Smokeless tobacco: Never Used  Substance and Sexual Activity  . Alcohol use: No  . Drug use: No  . Sexual activity: Yes  Other Topics Concern  . Not on file  Social History Narrative  . Not on file   Social Determinants of Health   Financial Resource Strain:   . Difficulty of Paying Living Expenses: Not on file  Food Insecurity:   . Worried About  Charity fundraiser in the Last Year: Not on file  . Ran Out of Food in the Last Year: Not on file  Transportation Needs:   . Lack of Transportation (Medical): Not on file  . Lack of Transportation (Non-Medical): Not on file  Physical Activity:   . Days of Exercise per Week: Not on file  . Minutes of Exercise per Session: Not on file  Stress:   . Feeling of Stress : Not on file  Social Connections:   . Frequency of Communication with Friends and Family: Not on file  . Frequency of Social Gatherings with Friends and Family: Not on file  . Attends Religious Services: Not on file  . Active Member of Clubs or Organizations: Not on file  . Attends Archivist Meetings: Not on file  . Marital Status: Not on file     Family History:  The patient's family history includes CAD in his brother; Hypertension in his brother; Lupus in his mother.   Review of Systems:   Please see the history of present illness.     General:  No chills, fever, night sweats or weight changes.  Cardiovascular:  No dyspnea on exertion, edema, orthopnea, palpitations, paroxysmal nocturnal dyspnea. Positive for chest pain (now resolved).  Dermatological: No rash, lesions/masses Respiratory: No cough, dyspnea Urologic: No hematuria, dysuria Abdominal:   No nausea, vomiting, diarrhea, bright red blood per rectum, melena, or hematemesis Neurologic:  No visual changes, wkns, changes in mental status. All other systems reviewed and are otherwise negative except as noted above.   Physical Exam:    VS:  BP 118/62   Pulse 83   Temp (!) 97.5 F (36.4 C)   Ht 5' 10"  (1.778 m)   Wt 135 lb (61.2 kg)   SpO2 98%   BMI 19.37 kg/m    General: Well developed, well nourished,male appearing in no acute distress. Head: Normocephalic, atraumatic, sclera non-icteric, no xanthomas, nares are without discharge.  Neck: No carotid bruits. JVD not elevated.  Lungs: Respirations regular and unlabored, without wheezes or  rales.  Heart: Regular rate and rhythm. No  S3 or S4.  No murmur, no rubs, or gallops appreciated. Abdomen: Soft, non-tender, non-distended with normoactive bowel sounds. No hepatomegaly. No rebound/guarding. No obvious abdominal masses. Msk:  Strength and tone appear normal for age. No joint deformities or effusions. Extremities: No clubbing or cyanosis. No lower extremity edema.  Distal pedal pulses are 2+ bilaterally. Radial cath site stable.  Neuro: Alert and oriented X 3. Moves all extremities spontaneously. No focal deficits noted. Psych:  Responds to questions appropriately with a normal affect. Skin: No rashes or lesions noted  Wt Readings from Last 3 Encounters:  09/04/19 135 lb (61.2 kg)  08/27/19 137 lb 6.4 oz (62.3 kg)  08/24/19 132 lb (59.9 kg)     Studies/Labs Reviewed:   EKG:  EKG is not ordered today. EKG from 08/24/2019 is reviewed which shows NSR, HR 60 with 1st degree AV Block and TWI along inferolateral leads.   Recent Labs: 08/27/2019: ALT 25; BUN 27; Creatinine 1.67; Hemoglobin 10.8; Platelet Count 113; Potassium 4.5; Sodium 138   Lipid Panel    Component Value Date/Time   CHOL 88 10/12/2018 0957   TRIG 55.0 10/12/2018 0957   HDL 43.20 10/12/2018 0957   CHOLHDL 2 10/12/2018 0957   VLDL 11.0 10/12/2018 0957   LDLCALC 34 10/12/2018 0957    Additional studies/ records that were reviewed today include:   Echocardiogram: 07/2019 IMPRESSIONS    1. Left ventricular ejection fraction, by visual estimation, is 50%. The left ventricle has low normal function. There is borderline left ventricular hypertrophy.  2. Elevated left atrial pressure.  3. Left ventricular diastolic parameters are consistent with Grade I diastolic dysfunction (impaired relaxation).  4. The left ventricle demonstrates global hypokinesis.  5. Global right ventricle has normal systolic function.The right ventricular size is normal. No increase in right ventricular wall thickness.  6. Left  atrial size was normal.  7. Right atrial size was normal.  8. Presence of pericardial fat pad.  9. Mild to moderate mitral annular calcification. 10. The mitral valve is grossly normal. Trace mitral valve regurgitation. 11. The tricuspid valve is grossly normal. Tricuspid valve regurgitation is trivial. 12. A 70m Edwards Sapien 3 THV is present in the aortic position. No perivalvular leak. 13. Aortic valve mean gradient measures 9.0 mmHg. 14. Aortic valve regurgitation is not visualized. 15. The pulmonic valve was grossly normal. Pulmonic valve regurgitation is trivial. 16. The inferior vena cava is dilated in size with >50% respiratory variability, suggesting right atrial pressure of 8 mmHg. 17. TR signal is inadequate for assessing pulmonary artery systolic pressure.   Cardiac Catheterization: 08/23/2019  A stent was successfully placed.    Non-ST elevation myocardial infarction due to de novo plaque rupture in the distal RCA.  Complicated but ultimately successful distal RCA stent using a 22 x 2.75 Onyx postdilated to 3.0 decreasing 99% stenosis to 0% with TIMI grade III flow.  Left main is widely patent  LAD contains a patent mid vessel stent.  Proximal to the stent is eccentric 50% narrowing.  First diagonal contains a stent that is widely patent.  A moderate size ramus intermedius contains 50 to 75% stenosis in its mid segment.  Circumflex is relatively small and is widely patent.  The SAPIEN TAVR valve was not crossed.  RECOMMENDATIONS:   Continue aspirin and Plavix.  Potentially eligible for discharge tomorrow.  Kidney function needs to be monitored closely and if there is a rise in creatinine tomorrow morning he should not be discharged but she did have  a creatinine done 48 hours after the procedure which would be Sunday morning.   Assessment:    1. Coronary artery disease involving native coronary artery of native heart without angina pectoris   2. S/P  TAVR (transcatheter aortic valve replacement)   3. Essential hypertension   4. Hyperlipidemia LDL goal <70      Plan:   In order of problems listed above:  1. CAD - he is s/p DES to LAD, DES to D1, and DESx2 to RCA in 2018. Most recently admitted with an NSTEMI earlier this month and catheterization showed a de novo plaque rupture in the distal RCA and he underwent successful PCI using a 22 x 2.75 Onyx DES. Stents along the LAD and D1 were patent with 50-75% RI stenosis.  - He denies any evidence of recurrent chest pain since hospital discharge and says his energy level has improved.   - Continue ASA, Plavix and statin therapy. He is not on a beta-blocker given bradycardia during his hospitalization.    2. Severe Aortic Stenosis - s/p TAVR in 08/2017. Most recent echocardiogram in 07/2017 showed no evidence of perivalvular leak and no significant regurgitation.  3. HTN - BP has actually been low-normal when checked at home. He was having episodes of hypertension but this was actually occurring prior to his recent NSTEMI. Given that SBP has been in the low-100's at home and BP at 118/62 during today's visit, I recommended continuing to hold Losartan at this time.   4. HLD - Followed by PCP. FLP in 09/2018 showed total cholesterol 88, triglycerides 55, HDL 43 and LDL 34. He remains on Atorvastatin 20 mg daily.   Medication Adjustments/Labs and Tests Ordered: Current medicines are reviewed at length with the patient today.  Concerns regarding medicines are outlined above.  Medication changes, Labs and Tests ordered today are listed in the Patient Instructions below. Patient Instructions  Medication Instructions:  Your physician recommends that you continue on your current medications as directed. Please refer to the Current Medication list given to you today.  *If you need a refill on your cardiac medications before your next appointment, please call your pharmacy*  Lab  Work: NONE  If you have labs (blood work) drawn today and your tests are completely normal, you will receive your results only by: [name] MyChart Message (if you have MyChart) OR . A paper copy in the mail If you have any lab test that is abnormal or we need to change your treatment, we will call you to review the results.  Testing/Procedures: NONE   Follow-Up: At [facility], you and your health needs are our priority.  As part of our continuing mission to provide you with exceptional heart care, we have created designated Provider Care Teams.  These Care Teams include your primary Cardiologist (physician) and Advanced Practice Providers (APPs -  Physician Assistants and Nurse Practitioners) who all work together to provide you with the care you need, when you need it.  Your next appointment:   5 month(s)  The format for your next appointment:   In Person  Provider:   Rozann Lesches, MD  Other Instructions Thank you for choosing [facility]!    Signed, Erma Heritage, PA-C  09/04/2019 5:43 PM    [place] [address] [place], [place] [address] Phone: [phone] Fax: [phone]

## 2019-09-04 ENCOUNTER — Ambulatory Visit (INDEPENDENT_AMBULATORY_CARE_PROVIDER_SITE_OTHER): Payer: Medicare Other | Admitting: Student

## 2019-09-04 ENCOUNTER — Encounter: Payer: Self-pay | Admitting: Student

## 2019-09-04 ENCOUNTER — Other Ambulatory Visit: Payer: Self-pay

## 2019-09-04 VITALS — BP 118/62 | HR 83 | Temp 97.5°F | Ht 70.0 in | Wt 135.0 lb

## 2019-09-04 DIAGNOSIS — I255 Ischemic cardiomyopathy: Secondary | ICD-10-CM | POA: Diagnosis not present

## 2019-09-04 DIAGNOSIS — I10 Essential (primary) hypertension: Secondary | ICD-10-CM

## 2019-09-04 DIAGNOSIS — E785 Hyperlipidemia, unspecified: Secondary | ICD-10-CM

## 2019-09-04 DIAGNOSIS — I2510 Atherosclerotic heart disease of native coronary artery without angina pectoris: Secondary | ICD-10-CM | POA: Diagnosis not present

## 2019-09-04 DIAGNOSIS — Z952 Presence of prosthetic heart valve: Secondary | ICD-10-CM

## 2019-09-04 NOTE — Patient Instructions (Signed)
Medication Instructions:  Your physician recommends that you continue on your current medications as directed. Please refer to the Current Medication list given to you today.  *If you need a refill on your cardiac medications before your next appointment, please call your pharmacy*  Lab Work: NONE  If you have labs (blood work) drawn today and your tests are completely normal, you will receive your results only by: [name] MyChart Message (if you have MyChart) OR . A paper copy in the mail If you have any lab test that is abnormal or we need to change your treatment, we will call you to review the results.  Testing/Procedures: NONE   Follow-Up: At [facility], you and your health needs are our priority.  As part of our continuing mission to provide you with exceptional heart care, we have created designated Provider Care Teams.  These Care Teams include your primary Cardiologist (physician) and Advanced Practice Providers (APPs -  Physician Assistants and Nurse Practitioners) who all work together to provide you with the care you need, when you need it.  Your next appointment:   5 month(s)  The format for your next appointment:   In Person  Provider:   Rozann Lesches, MD  Other Instructions Thank you for choosing [facility]!

## 2019-09-16 ENCOUNTER — Other Ambulatory Visit: Payer: Self-pay

## 2019-09-17 ENCOUNTER — Encounter: Payer: Self-pay | Admitting: Internal Medicine

## 2019-09-17 ENCOUNTER — Ambulatory Visit (INDEPENDENT_AMBULATORY_CARE_PROVIDER_SITE_OTHER): Payer: Medicare Other | Admitting: Internal Medicine

## 2019-09-17 VITALS — BP 138/82 | HR 60 | Ht 70.0 in | Wt 138.0 lb

## 2019-09-17 DIAGNOSIS — E1159 Type 2 diabetes mellitus with other circulatory complications: Secondary | ICD-10-CM | POA: Diagnosis not present

## 2019-09-17 DIAGNOSIS — E1165 Type 2 diabetes mellitus with hyperglycemia: Secondary | ICD-10-CM | POA: Diagnosis not present

## 2019-09-17 DIAGNOSIS — E46 Unspecified protein-calorie malnutrition: Secondary | ICD-10-CM

## 2019-09-17 DIAGNOSIS — E782 Mixed hyperlipidemia: Secondary | ICD-10-CM

## 2019-09-17 LAB — POCT GLYCOSYLATED HEMOGLOBIN (HGB A1C): Hemoglobin A1C: 6.5 % — AB (ref 4.0–5.6)

## 2019-09-17 NOTE — Patient Instructions (Addendum)
Please continue: - Basaglar 9 units in a.m.  Please increase: - Humalog 5 to 8 units before the 3 meals, even if the sugars before the meal are normal. However, if sugars before the meal are lower than 70, you can skip Humalog without meal.  Please return in 4 months with your sugar log.

## 2019-09-17 NOTE — Progress Notes (Signed)
Patient ID: Manuel Olson, male   DOB: 03/20/1946, 74 y.o.   MRN: [id number]   This visit occurred during the SARS-CoV-2 public health emergency.  Safety protocols were in place, including screening questions prior to the visit, additional usage of staff PPE, and extensive cleaning of exam room while observing appropriate contact time as indicated for disinfecting solutions.   HPI: Manuel Olson is a 74 y.o.-year-old male, initially referred by his oncologist, Dr. Benay Spice, presenting for follow-up for DM2, dx in 01/2016, insulin-dependent, uncontrolled, with complications (CAD - s/p AMI 11/2016 - s/p 4x DES; Ao stenosis). He saw Dr. Dorris Fetch before.  Last visit 4 months ago.  2 weeks ago >> he developed CP >> went to the ED >> ended up having another cardiac stent.  Of note, patient has a significant history of pancreatic cancer diagnosed 07/2017.  He started chemotherapy 09/2017.  Since then, he also had a kidney removed for renal cell carcinoma and at the same time he had pancreatic resection (head of the pancreas) 02/2018.  He was found to have metastases in his lymph nodes.  He restarted chemotherapy after the surgery but finished 06/2018.  Reviewed HbA1c levels: Lab Results  Component Value Date   HGBA1C 7.3 (A) 05/14/2019   HGBA1C 7.6 (A) 10/12/2018   HGBA1C 7.1 (A) 06/25/2018   His insulin production after pancreatic resection was low: Component     Latest Ref Rng & Units 10/12/2018  C-Peptide     0.80 - 3.85 ng/mL 0.53 (L)  Glucose, Plasma     65 - 99 mg/dL 168 (H)   Pt was on a regimen of: - Glipizide 5 mg daily  - Tresiba 20 units at bedtime (samples, cannot afford this)    He is currently on:  - Basaglar 8-10 >> 9 >> 10 >> 8-9 units at bedtime >> 9 units in a.m. - Humalog 2-4 >> 3-5 >> 5 units  before the 3 meals, even if the sugars before the meals are normal. If sugar before meals <70, do not take Humalog with that meals.  Pt was checking his sugar 4 times a day  with his freestyle libre CGM.  Freestyle libre CGM parameters (we will scan the reports): - Average: 153 - % active CGM time: 86% of the time - Glucose variability 34.7 (target < or = to 36%) - time in range:  - very low (<54): 0% - low (54-69): 1% - normal range (70-180): 72% - high sugars (181-250): 20% - very high sugars (>250): 7%  Previously: - am:  52, 55, 59, 62-120, 135 >> 89-135, 166 >> 86-139, 166 >>95-138, 150, 191 - 2h after b'fast: n/c >> 137 >. n/c - before lunch: 140-301 >> 95-151, 164 >> n/c >> 123-208, 234 >> 108-212, 287 - 2h after lunch: n/c - before dinner: 140 >> 152-260 >> 100-166, 185, 215 >> 67, 75-266, 292 - 2h after dinner: n/c - bedtime:  143, 159-255, 332 >> 104-262 >> 164-235, 337 >> 124-247, 268 - nighttime: n/c Lowest sugar was  42 (libre) >> 86 >> 67 >> 60; it is unclear at which level he has hypoglycemia awareness. Highest sugar was 337 >> 292 >> 379  Glucometer: True Metrix  Pt's meals are: - Breakfast: bacon + eggs + English muffin + occas. Tomato juice   - Lunch: grilled ham and cheese sandwich + potato chips - Dinner: chicken + veggies + starch - Snacks: fruit cup, pudding, fig newtons, icecreams  + CKD, last BUN/creatinine:  Lab Results  Component Value Date   BUN 27 (H) 08/27/2019   BUN 26 (H) 08/24/2019   CREATININE 1.67 (H) 08/27/2019   CREATININE 1.52 (H) 08/24/2019  On Cozaar. + HL; last set of lipids: Lab Results  Component Value Date   CHOL 88 10/12/2018   HDL 43.20 10/12/2018   LDLCALC 34 10/12/2018   TRIG 55.0 10/12/2018   CHOLHDL 2 10/12/2018  On Lipitor. - last eye exam was in 07/2018: No DR. 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  He has a history of laser surgeries. - no numbness but has tingling in his feet  Pt has FH of DM in brother.  He also has a history of HTN.  ROS: Constitutional: no weight gain/no weight loss, no fatigue, no subjective hyperthermia, no subjective hypothermia Eyes: no blurry vision, no  xerophthalmia ENT: no sore throat, no nodules palpated in neck, no dysphagia, no odynophagia, no hoarseness Cardiovascular: no CP/no SOB/no palpitations/no leg swelling Respiratory: no cough/no SOB/no wheezing Gastrointestinal: no N/no V/no D/no C/no acid reflux Musculoskeletal: no muscle aches/no joint aches Skin: no rashes, no hair loss Neurological: no tremors/no numbness/no tingling/no dizziness  I reviewed pt's medications, allergies, PMH, social hx, family hx, and changes were documented in the history of present illness. Otherwise, unchanged from my initial visit note.  Past Medical History:  Diagnosis Date  . Arthritis   . CAD (coronary artery disease)    a. 11/2016 in [place]: STEMI s/p DES to mid LAD, DES to diagonal, DES x 2 to proximal and mid RCA b. 08/2019: NSTEMI with DES to distal RCA  . Essential hypertension   . Full dentures   . GERD (gastroesophageal reflux disease)   . History of stroke   . Hyperlipidemia   . Myocardial infarction ([place])   . Pancreatic cancer ([place])   . Pneumonia   . Renal cell carcinoma ([place])   . S/P TAVR (transcatheter aortic valve replacement)    a. 08/2017:  Edwards Sapien 3 THV (size 26 mm, model #9600CM26A , serial # [id number])  . Severe aortic stenosis    a. 08/2017: s/p TAVR by Dr. Angelena Form and Dr. Cyndia Bent.   . Type 2 diabetes mellitus ([place])   . Wears glasses   . Wears hearing aid    Past Surgical History:  Procedure Laterality Date  . APPENDECTOMY    . CARDIAC CATHETERIZATION    . CORONARY ANGIOGRAPHY N/A 08/23/2019   Procedure: CORONARY ANGIOGRAPHY;  Surgeon: Belva Crome, MD;  Location: [place] [facility];  Service: Cardiovascular;  Laterality: N/A;  . CORONARY STENT INTERVENTION N/A 08/23/2019   Procedure: CORONARY STENT INTERVENTION;  Surgeon: Belva Crome, MD;  Location: [place] [facility];  Service: Cardiovascular;  Laterality: N/A;  . CORONARY STENT PLACEMENT     x 4 in March 2018  . EUS N/A 08/02/2017   Procedure: UPPER  ENDOSCOPIC ULTRASOUND (EUS) RADIAL;  Surgeon: [name], MD;  Location: [facility];  Service: Endoscopy;  Laterality: N/A;  . EYE SURGERY Left   . HAND SURGERY Bilateral   . HERNIA REPAIR Right   . LAPAROSCOPY N/A 02/19/2018   Procedure: DIAGNOSTIC LAPAROSCOPY, ERAS PATHWAY;  Surgeon: [name], MD;  Location: [place];  Service: General;  Laterality: N/A;  . MULTIPLE TOOTH EXTRACTIONS    . NEPHRECTOMY Left 02/19/2018   Procedure: OPEN LEFT RADICAL NEPHRECTOMY;  Surgeon: Cleon Gustin, MD;  Location: [place];  Service: Urology;  Laterality: Left;  . PORT-A-CATH REMOVAL N/A 06/13/2019  Procedure: PORT REMOVAL;  Surgeon: [name], MD;  Location: [place];  Service: General;  Laterality: N/A;  . PORTACATH PLACEMENT N/A 09/15/2017   Procedure: INSERTION PORT-A-CATH;  Surgeon: [name], MD;  Location: [place];  Service: General;  Laterality: N/A;  . RIGHT/LEFT HEART CATH AND CORONARY ANGIOGRAPHY N/A 07/05/2017   Procedure: RIGHT/LEFT HEART CATH AND CORONARY ANGIOGRAPHY;  Surgeon: [name], MD;  Location: [place] [facility];  Service: Cardiovascular;  Laterality: N/A;  . SHOULDER ARTHROSCOPY WITH ROTATOR CUFF REPAIR Left   . TEE WITHOUT CARDIOVERSION N/A 08/22/2017   Procedure: TRANSESOPHAGEAL ECHOCARDIOGRAM (TEE);  Surgeon: Burnell Blanks, MD;  Location: [place];  Service: Open Heart Surgery;  Laterality: N/A;  . TRANSCATHETER AORTIC VALVE REPLACEMENT, TRANSFEMORAL N/A 08/22/2017   Procedure: TRANSCATHETER AORTIC VALVE REPLACEMENT, TRANSFEMORAL;  Surgeon: Burnell Blanks, MD;  Location: [place];  Service: Open Heart Surgery;  Laterality: N/A;  . WHIPPLE PROCEDURE N/A 02/19/2018   Procedure: WHIPPLE PROCEDURE;  Surgeon: [name], MD;  Location: [place];  Service: General;  Laterality: N/A;   Social History   Socioeconomic History  . Marital status: Married    Spouse name: Not on file  . Number of children: Not on file  Social Needs   Occupational History  .  Retired  Tobacco Use  . Smoking status: Former Smoker    Packs/day: 1.00    Years: 57.00    Pack years: 57.00    Types: Cigarettes    Start date: 09/12/1958    Last attempt to quit: 2018    Years since quitting: 1.5  . Smokeless tobacco: Never Used  Substance and Sexual Activity  . Alcohol use: No  . Drug use: No   Current Outpatient Medications on File Prior to Visit  Medication Sig Dispense Refill  . acetaminophen (TYLENOL) 325 MG tablet Take 650 mg by mouth every 6 (six) hours as needed (for pain.).    [name] aspirin 81 MG chewable tablet Chew 1 tablet (81 mg total) by mouth daily. 30 tablet 2  . atorvastatin (LIPITOR) 20 MG tablet TAKE 1 TABLET BY MOUTH ONCE DAILY. 90 tablet 3  . clopidogrel (PLAVIX) 75 MG tablet Take 1 tablet (75 mg total) by mouth daily with breakfast. 30 tablet 1  . CREON 36000 units CPEP capsule TAKE 1 CAPSULE BY MOUTH BEFORE EACH MEAL AND SNACK. TAKE UP TO 6 CAPSULES PER DAY. (Patient taking differently: Take 36,000 Units by mouth See admin instructions. Take 1 capsule before each meal and with snacks.) 90 capsule 0  . Insulin Glargine (BASAGLAR KWIKPEN) 100 UNIT/ML SOPN Inject 0.1 mLs (10 Units total) into the skin at bedtime. (Patient taking differently: Inject 9 Units into the skin daily. ) 5 pen 5  . insulin lispro (HUMALOG KWIKPEN) 100 UNIT/ML KwikPen Inject 3-5 units 3 times a day with meals. (Patient taking differently: Inject 5 Units into the skin 3 (three) times daily before meals. Inject 3-5 units 3 times a day with meals.) 15 mL 11  . Insulin Pen Needle (PEN NEEDLES) 31G X 6 MM MISC Use as directed to check blood sugar 4 times a day. 400 each 2  . nitroGLYCERIN (NITROSTAT) 0.4 MG SL tablet Place 1 tablet (0.4 mg total) under the tongue every 5 (five) minutes as needed for chest pain. 20 tablet 0  . oxyCODONE (OXY IR/ROXICODONE) 5 MG immediate release tablet Take 1 tablet (5 mg total) by mouth every 6 (six) hours as needed for severe  pain. 10  tablet 0   No current facility-administered medications on file prior to visit.   No Known Allergies Family History  Problem Relation Age of Onset  . Lupus Mother   . CAD Brother   . Hypertension Brother     PE: BP 138/82   Pulse 60   Ht 5' 10"  (1.778 m)   Wt 138 lb (62.6 kg)   SpO2 99%   BMI 19.80 kg/m  Wt Readings from Last 3 Encounters:  09/17/19 138 lb (62.6 kg)  09/04/19 135 lb (61.2 kg)  08/27/19 137 lb 6.4 oz (62.3 kg)   Constitutional: thin, in NAD Eyes: PERRLA, EOMI, no exophthalmos ENT: moist mucous membranes, no thyromegaly, no cervical lymphadenopathy Cardiovascular: RRR, No MRG Respiratory: CTA B Gastrointestinal: abdomen soft, NT, ND, BS+ Musculoskeletal: no deformities, strength intact in all 4 Skin: moist, warm, no rashes Neurological: no tremor with outstretched hands, DTR normal in all 4  ASSESSMENT: 1. DM2, non-insulin-dependent, uncontrolled, with complications - CAD - s/p AMI 11/2016 - s/p DES x4 -  Ao stenosis  2. HL  3.  Protein caloric malnutrition  PLAN:  1. Patient with history of initially controlled diabetes, with deteriorating control around the time of his pancreatic cancer diagnosis in 2018.  After his diagnosis and after pancreatectomy, we started insulin.  His C-peptide was low so he will need to continue on insulin.  He continues on a basal-bolus insulin regimen, which we adjusted at last visit.   At that time, his sugars were close to goal in the morning and they were increasing before lunch and dinner so we increase his Humalog before meals and we also increased his Basaglar dose by 1 unit and moved it in the morning as he was worried that he was dropping his sugars too much overnight.  We also discussed at last visit when to skip his mealtime insulin.  We decided to only give it if his sugars are lower than 70 before meals. -He continues to use his freestyle libre CGM, which he likes -At this visit, we reviewed the reports  of his CGM and these will be scanned in the system.  It appears that his sugars are excellent overnight, but they increase after the first meal of the day and remain high until that time.  He feels that moving the Basaglar dose in the morning helped his sugars significantly and I agree that they are lower than before, however, they are still quite high after meals.  Upon questioning, she is only taking 5 units of Humalog and does not change the dose based on the size of the meal or the sugars after meals, as advised.  At this visit, I advised him to increase the dose of Humalog to approximately 8 units in an effort to improve his postprandial hyperglycemia.  We will continue the same dose of Lantus. - I suggested to:  Patient Instructions  Please continue: - Basaglar 9 units in a.m.  Please increase: - Humalog 5 to 8 units before the 3 meals, even if the sugars before the meal are normal. However, if sugars before the meal are lower than 70, you can skip Humalog without meal.  Please return in 4 months with your sugar log.   - we checked his HbA1c: 6.5% (better) - advised to check sugars at different times of the day - 4x a day, rotating check times - advised for yearly eye exams >> he is not UTD - return to clinic in 4 months  2. HL -Reviewed latest lipid panel from 09/2018: All fractions excellent Lab Results  Component Value Date   CHOL 88 10/12/2018   HDL 43.20 10/12/2018   LDLCALC 34 10/12/2018   TRIG 55.0 10/12/2018   CHOLHDL 2 10/12/2018  -Continues Lipitor without side effects  3.  Protein caloric malnutrition -weight at last OV: 136 lbs >> now: 138 lbs (gained net 2 lbs) -He continues to try to gain weight -his diabetic regimen consists only of insulin, which has an anabolic effect.  At this visit, we will increase his mealtime insulin, which should also help with weight gain.  We need to try to avoid any medications that have weight loss as a side effect.  Philemon Kingdom, MD PhD [facility]

## 2019-09-19 ENCOUNTER — Telehealth: Payer: Self-pay

## 2019-09-19 NOTE — Telephone Encounter (Signed)
Patients wife called in not remembering what the Dr told him to do as far as his medication. " he thinks he should be doing something at night before bed". Patients wife is wanting a understanding because she thinks husband doesn't know what was said in visit for insulin     Please call and advise

## 2019-09-19 NOTE — Telephone Encounter (Signed)
M, No, there is nothing that he needs to take at bedtime from the diabetes point of view.

## 2019-09-19 NOTE — Telephone Encounter (Signed)
Spoke to patient's wife to clarify:  Patient thinks he was told to take something at night but can not recall. I have received chart notes and do not see mention of this.  Please advise  Please continue: - Basaglar 9 units in a.m.  Please increase: - Humalog 5 to 8 units before the 3 meals, even if the sugars before the meal are normal. However, if sugars before the meal are lower than 70, you can skip Humalog without meal.  Please return in 4 months with your sugar log.

## 2019-10-09 ENCOUNTER — Other Ambulatory Visit: Payer: Self-pay

## 2019-10-09 ENCOUNTER — Ambulatory Visit (INDEPENDENT_AMBULATORY_CARE_PROVIDER_SITE_OTHER): Payer: Medicare Other | Admitting: Family Medicine

## 2019-10-09 ENCOUNTER — Encounter: Payer: Self-pay | Admitting: Family Medicine

## 2019-10-09 VITALS — BP 154/72 | HR 68 | Temp 97.0°F | Ht 70.0 in | Wt 137.0 lb

## 2019-10-09 DIAGNOSIS — E782 Mixed hyperlipidemia: Secondary | ICD-10-CM

## 2019-10-09 DIAGNOSIS — Z0000 Encounter for general adult medical examination without abnormal findings: Secondary | ICD-10-CM

## 2019-10-09 DIAGNOSIS — I25119 Atherosclerotic heart disease of native coronary artery with unspecified angina pectoris: Secondary | ICD-10-CM

## 2019-10-09 DIAGNOSIS — C250 Malignant neoplasm of head of pancreas: Secondary | ICD-10-CM

## 2019-10-09 DIAGNOSIS — E1165 Type 2 diabetes mellitus with hyperglycemia: Secondary | ICD-10-CM | POA: Diagnosis not present

## 2019-10-09 DIAGNOSIS — E1159 Type 2 diabetes mellitus with other circulatory complications: Secondary | ICD-10-CM

## 2019-10-09 LAB — COMPREHENSIVE METABOLIC PANEL
ALT: 22 U/L (ref 0–53)
AST: 22 U/L (ref 0–37)
Albumin: 4 g/dL (ref 3.5–5.2)
Alkaline Phosphatase: 99 U/L (ref 39–117)
BUN: 28 mg/dL — ABNORMAL HIGH (ref 6–23)
CO2: 26 mEq/L (ref 19–32)
Calcium: 8.9 mg/dL (ref 8.4–10.5)
Chloride: 104 mEq/L (ref 96–112)
Creatinine, Ser: 1.51 mg/dL — ABNORMAL HIGH (ref 0.40–1.50)
GFR: 45.15 mL/min — ABNORMAL LOW (ref >60.00)
Glucose, Bld: 226 mg/dL — ABNORMAL HIGH (ref 70–99)
Potassium: 5 mEq/L (ref 3.5–5.1)
Sodium: 136 mEq/L (ref 135–145)
Total Bilirubin: 0.4 mg/dL (ref 0.2–1.2)
Total Protein: 6 g/dL (ref 6.0–8.3)

## 2019-10-09 LAB — LIPID PANEL
Cholesterol: 92 mg/dL (ref 0–200)
HDL: 49.7 mg/dL (ref >39.00)
LDL Cholesterol: 28 mg/dL (ref 0–99)
NonHDL: 42.43
Total CHOL/HDL Ratio: 2
Triglycerides: 71 mg/dL (ref 0.0–149.0)
VLDL: 14.2 mg/dL (ref 0.0–40.0)

## 2019-10-09 LAB — CBC
HCT: 35.1 % — ABNORMAL LOW (ref 39.0–52.0)
Hemoglobin: 11.4 g/dL — ABNORMAL LOW (ref 13.0–17.0)
MCHC: 32.5 g/dL (ref 30.0–36.0)
MCV: 96.4 fl (ref 78.0–100.0)
Platelets: 123 10*3/uL — ABNORMAL LOW (ref 150.0–400.0)
RBC: 3.64 Mil/uL — ABNORMAL LOW (ref 4.22–5.81)
RDW: 15.1 % (ref 11.5–15.5)
WBC: 6.4 10*3/uL (ref 4.0–10.5)

## 2019-10-09 LAB — TSH: TSH: 1.77 u[IU]/mL (ref 0.35–4.50)

## 2019-10-09 NOTE — Assessment & Plan Note (Signed)
Continue management per oncology.

## 2019-10-09 NOTE — Assessment & Plan Note (Signed)
Continue aspirin and Plavix per cardiology.

## 2019-10-09 NOTE — Progress Notes (Signed)
Chief Complaint:  Manuel Olson is a 74 y.o. male who presents today for a Initial Medicare Annual Wellness Visit and to discuss management of his chronic medical problems.  Assessment/Plan:  Chronic Problems Addressed Today: Mixed hyperlipidemia Continue Lipitor 20 mg daily.  Check lipid panel.  Poorly controlled type 2 diabetes mellitus with circulatory disorder [facility]) Continue management per endocrinology.  CAD (coronary artery disease) Continue aspirin and Plavix per cardiology.  Cancer of head of pancreas [facility]) s/p Whipple 02/2018 Continue management per oncology.  Preventative Healthcare Health Maintenance Due  Topic Date Due  . Hepatitis C Screening  07/24/1946  . FOOT EXAM  09/30/1953  . OPHTHALMOLOGY EXAM  09/30/1953  . URINE MICROALBUMIN  09/30/1953    During the course of the visit the patient was educated and counseled about appropriate screening and preventive services including:        Fall prevention   Nutrition Physical Activity Weight Management Cognition    Subjective:  HPI:  Health Risk Assessment: Patient considers his overall health to be good. He has no difficulty performing the following: . Preparing food and eating . Bathing  . Getting dressed . Using the toilet . Shopping . Managing Finances . 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   He has not had any falls within the past year.   Depression screen PHQ 2/9 10/09/2019  Decreased Interest 0  Down, Depressed, Hopeless 0  PHQ - 2 Score 0  Some recent data might be hidden   Lifestyle Factors: Diet: Watching carbs.  Exercise: Does a lot walking and yard work.   Patient Care Team: [name], MD as PCP - General (Family Medicine) Satira Sark, MD as PCP - Cardiology (Cardiology)   He has no acute complaints today.   His stable, chronic medical conditions are outlined below:  # Pancreatic Cancer  s/p Whipple - Following with oncology - On creaon  # AS s/p TAVR / Dyslipidemia / Hypertension with orthostasis - Following with cardiology - On plavix 52m daily and lipitor 286mdaily  # T2DM - Follows with endocrinology - On basal-bolus insulin  # Renal Cell Carcinoma s/p nephrectomy  ROS: Per HPI, otherwise a complete review of systems was negative.   PMH:  The following were reviewed and entered/updated in epic: Past Medical History:  Diagnosis Date  . Arthritis   . CAD (coronary artery disease)    [place], DES to diagonal, DES x 2 to proximal and mid RCA b. 08/2019: NSTEMI with DES to distal RCA  . Essential hypertension   . Full dentures   . GERD (gastroesophageal reflux disease)   . History of stroke   . Hyperlipidemia   . [place] ([place]  . Pancreatic cancer ([place]  . Pneumonia   . Renal cell carcinoma ([place]  . S/P TAVR (transcatheter aortic valve replacement)    a. 08/2017:  Edwards Sapien 3 THV (size 26 mm, model #9600CM26A , serial # [id number] . Severe aortic stenosis    a. 08/2017: s/p TAVR by Dr. McAngelena Formnd Dr. BaCyndia Bent  . Type 2 diabetes mellitus ([place]  . Wears glasses   . Wears hearing aid    Patient Active Problem List   Diagnosis Date Noted  . Ang[place] pectoris ([place]12/06/2019  . Protein-calorie malnutrition, severe 04/05/2018  . Debility 02/27/2018  . Port-A-Cath in  place 10/10/2017  . Renal cell carcinoma of right kidney [facility]) s/p nephrectomy 02/2018 10/02/2017  . Ileitis, terminal ([place]) 09/28/2017  . Poorly controlled type 2 diabetes mellitus with circulatory disorder ([place]) 08/25/2017  . Mixed hyperlipidemia 08/25/2017  . Essential hypertension, benign   . CAD (coronary artery disease)   . Former smoker   . Severe AS S/P TAVR  08/22/2017  . Cancer of head of pancreas [facility]) s/p Whipple 02/2018 08/10/2017   Past Surgical History:  Procedure Laterality Date  . APPENDECTOMY    . CARDIAC  CATHETERIZATION    . CORONARY ANGIOGRAPHY N/A 08/23/2019   Procedure: CORONARY ANGIOGRAPHY;  Surgeon: Belva Crome, MD;  Location: [place] CV LAB;  Service: Cardiovascular;  Laterality: N/A;  . CORONARY STENT INTERVENTION N/A 08/23/2019   Procedure: CORONARY STENT INTERVENTION;  Surgeon: Belva Crome, MD;  Location: [place] CV LAB;  Service: Cardiovascular;  Laterality: N/A;  . CORONARY STENT PLACEMENT     x 4 in March 2018  . EUS N/A 08/02/2017   Procedure: UPPER ENDOSCOPIC ULTRASOUND (EUS) RADIAL;  Surgeon: [name], MD;  Location: [facility];  Service: Endoscopy;  Laterality: N/A;  . EYE SURGERY Left   . HAND SURGERY Bilateral   . HERNIA REPAIR Right   . LAPAROSCOPY N/A 02/19/2018   Procedure: DIAGNOSTIC LAPAROSCOPY, ERAS PATHWAY;  Surgeon: [name], MD;  Location: [place];  Service: General;  Laterality: N/A;  . MULTIPLE TOOTH EXTRACTIONS    . NEPHRECTOMY Left 02/19/2018   Procedure: OPEN LEFT RADICAL NEPHRECTOMY;  Surgeon: Cleon Gustin, MD;  Location: [place];  Service: Urology;  Laterality: Left;  . PORT-A-CATH REMOVAL N/A 06/13/2019   Procedure: PORT REMOVAL;  Surgeon: [name], MD;  Location: [place];  Service: General;  Laterality: N/A;  . PORTACATH PLACEMENT N/A 09/15/2017   Procedure: INSERTION PORT-A-CATH;  Surgeon: [name], MD;  Location: [place];  Service: General;  Laterality: N/A;  . RIGHT/LEFT HEART CATH AND CORONARY ANGIOGRAPHY N/A 07/05/2017   Procedure: RIGHT/LEFT HEART CATH AND CORONARY ANGIOGRAPHY;  Surgeon: [name], MD;  Location: [place] CV LAB;  Service: Cardiovascular;  Laterality: N/A;  . SHOULDER ARTHROSCOPY WITH ROTATOR CUFF REPAIR Left   . TEE WITHOUT CARDIOVERSION N/A 08/22/2017   Procedure: TRANSESOPHAGEAL ECHOCARDIOGRAM (TEE);  Surgeon: Burnell Blanks, MD;  Location: [place];  Service: Open Heart Surgery;  Laterality: N/A;  . TRANSCATHETER AORTIC VALVE REPLACEMENT, TRANSFEMORAL N/A  08/22/2017   Procedure: TRANSCATHETER AORTIC VALVE REPLACEMENT, TRANSFEMORAL;  Surgeon: Burnell Blanks, MD;  Location: [place];  Service: Open Heart Surgery;  Laterality: N/A;  . WHIPPLE PROCEDURE N/A 02/19/2018   Procedure: WHIPPLE PROCEDURE;  Surgeon: [name], MD;  Location: [facility] OR;  Service: General;  Laterality: N/A;    Family History  Problem Relation Age of Onset  . Lupus Mother   . CAD Brother   . Hypertension Brother     Medications- reviewed and updated Current Outpatient Medications  Medication Sig Dispense Refill  . aspirin 81 MG chewable tablet Chew 1 tablet (81 mg total) by mouth daily. 30 tablet 2  . atorvastatin (LIPITOR) 20 MG tablet TAKE 1 TABLET BY MOUTH ONCE DAILY. 90 tablet 3  . clopidogrel (PLAVIX) 75 MG tablet Take 1 tablet (75 mg total) by mouth daily with breakfast. 30 tablet 1  . CREON 36000 units CPEP capsule TAKE 1 CAPSULE BY MOUTH BEFORE EACH MEAL AND SNACK. TAKE UP TO 6 CAPSULES PER DAY. (Patient taking differently: Take  36,000 Units by mouth See admin instructions. Take 1 capsule before each meal and with snacks.) 90 capsule 0  . Insulin Glargine (BASAGLAR KWIKPEN) 100 UNIT/ML SOPN Inject 0.1 mLs (10 Units total) into the skin at bedtime. (Patient taking differently: Inject 9 Units into the skin daily. ) 5 pen 5  . insulin lispro (HUMALOG KWIKPEN) 100 UNIT/ML KwikPen Inject 3-5 units 3 times a day with meals. (Patient taking differently: Inject 5 Units into the skin 3 (three) times daily before meals. Inject 5-8 units 3 times a day with meals.) 15 mL 11  . Insulin Pen Needle (PEN NEEDLES) 31G X 6 MM MISC Use as directed to check blood sugar 4 times a day. 400 each 2  . nitroGLYCERIN (NITROSTAT) 0.4 MG SL tablet Place 1 tablet (0.4 mg total) under the tongue every 5 (five) minutes as needed for chest pain. 20 tablet 0   No current facility-administered medications for this visit.    Allergies-reviewed and updated No Known Allergies  Social  History   Socioeconomic History  . Marital status: Married    Spouse name: Not on file  . Number of children: Not on file  . Years of education: Not on file  . Highest education level: Not on file  Occupational History  . Not on file  Tobacco Use  . Smoking status: Former Smoker    Packs/day: 1.00    Years: 57.00    Pack years: 57.00    Types: Cigarettes    Start date: 09/12/1958    Quit date: 04/12/2016    Years since quitting: 3.4  . Smokeless tobacco: Never Used  Substance and Sexual Activity  . Alcohol use: No  . Drug use: No  . Sexual activity: Yes  Other Topics Concern  . Not on file  Social History Narrative  . Not on file   Social Determinants of Health   Financial Resource Strain:   . Difficulty of Paying Living Expenses: Not on file  Food Insecurity:   . Worried About Charity fundraiser in the Last Year: Not on file  . Ran Out of Food in the Last Year: Not on file  Transportation Needs:   . Lack of Transportation (Medical): Not on file  . Lack of Transportation (Non-Medical): Not on file  Physical Activity:   . Days of Exercise per Week: Not on file  . Minutes of Exercise per Session: Not on file  Stress:   . Feeling of Stress : Not on file  Social Connections:   . Frequency of Communication with Friends and Family: Not on file  . Frequency of Social Gatherings with Friends and Family: Not on file  . Attends Religious Services: Not on file  . Active Member of Clubs or Organizations: Not on file  . Attends Archivist Meetings: Not on file  . Marital Status: Not on file         Objective/Observations  Physical Exam: BP (!) 154/72   Pulse 68   Temp (!) 97 F (36.1 C)   Ht 5' 10"  (1.778 m)   Wt 137 lb (62.1 kg)   SpO2 98%   BMI 19.66 kg/m  Gen: NAD, resting comfortably HEENT: TMs normal bilaterally. OP clear. No thyromegaly noted.  CV: RRR with no murmurs appreciated Pulm: NWOB, CTAB with no crackles, wheezes, or rhonchi GI: Normal  bowel sounds present. Soft, Nontender, Nondistended. MSK: no edema, cyanosis, or clubbing noted Skin: warm, dry Neuro: CN2-12 grossly intact. Strength 5/5 in  upper and lower extremities. Reflexes symmetric and intact bilaterally. Normal minicog.  Psych: Normal affect and thought content      Clark Cuff M. [name], MD 10/09/2019 10:35 AM

## 2019-10-09 NOTE — Assessment & Plan Note (Signed)
Continue management per endocrinology.

## 2019-10-09 NOTE — Patient Instructions (Signed)
It was very nice to see you today!  Keep up the good work!  We will check blood work today.  Come back in a year for your next check up, or sooner if needed.   Take care, Dr [name]  Please try these tips to maintain a healthy lifestyle:   Eat at least 3 REAL meals and 1-2 snacks per day.  Aim for no more than 5 hours between eating.  If you eat breakfast, please do so within one hour of getting up.    Each meal should contain half fruits/vegetables, one quarter protein, and one quarter carbs (no bigger than a computer mouse)   Cut down on sweet beverages. This includes juice, soda, and sweet tea.     Drink at least 1 glass of water with each meal and aim for at least 8 glasses per day   Exercise at least 150 minutes every week.    Preventive Care 21 Years and Older, Male Preventive care refers to lifestyle choices and visits with your health care provider that can promote health and wellness. This includes:  A yearly physical exam. This is also called an annual well check.  Regular dental and eye exams.  Immunizations.  Screening for certain conditions.  Healthy lifestyle choices, such as diet and exercise. What can I expect for my preventive care visit? Physical exam Your health care provider will check:  Height and weight. These may be used to calculate body mass index (BMI), which is a measurement that tells if you are at a healthy weight.  Heart rate and blood pressure.  Your skin for abnormal spots. Counseling Your health care provider may ask you questions about:  Alcohol, tobacco, and drug use.  Emotional well-being.  Home and relationship well-being.  Sexual activity.  Eating habits.  History of falls.  Memory and ability to understand (cognition).  Work and work Statistician. What immunizations do I need?  Influenza (flu) vaccine  This is recommended every year. Tetanus, diphtheria, and pertussis (Tdap) vaccine  You may need a Td  booster every 10 years. Varicella (chickenpox) vaccine  You may need this vaccine if you have not already been vaccinated. Zoster (shingles) vaccine  You may need this after age 28. Pneumococcal conjugate (PCV13) vaccine  One dose is recommended after age 43. Pneumococcal polysaccharide (PPSV23) vaccine  One dose is recommended after age 40. Measles, mumps, and rubella (MMR) vaccine  You may need at least one dose of MMR if you were born in 1957 or later. You may also need a second dose. Meningococcal conjugate (MenACWY) vaccine  You may need this if you have certain conditions. Hepatitis A vaccine  You may need this if you have certain conditions or if you travel or work in places where you may be exposed to hepatitis A. Hepatitis B vaccine  You may need this if you have certain conditions or if you travel or work in places where you may be exposed to hepatitis B. Haemophilus influenzae type b (Hib) vaccine  You may need this if you have certain conditions. You may receive vaccines as individual doses or as more than one vaccine together in one shot (combination vaccines). Talk with your health care provider about the risks and benefits of combination vaccines. What tests do I need? Blood tests  Lipid and cholesterol levels. These may be checked every 5 years, or more frequently depending on your overall health.  Hepatitis C test.  Hepatitis B test. Screening  Lung cancer screening.  You may have this screening every year starting at age 75 if you have a 30-pack-year history of smoking and currently smoke or have quit within the past 15 years.  Colorectal cancer screening. All adults should have this screening starting at age 43 and continuing until age 49. Your health care provider may recommend screening at age 53 if you are at increased risk. You will have tests every 1-10 years, depending on your results and the type of screening test.  Prostate cancer screening.  Recommendations will vary depending on your family history and other risks.  Diabetes screening. This is done by checking your blood sugar (glucose) after you have not eaten for a while (fasting). You may have this done every 1-3 years.  Abdominal aortic aneurysm (AAA) screening. You may need this if you are a current or former smoker.  Sexually transmitted disease (STD) testing. Follow these instructions at home: Eating and drinking  Eat a diet that includes fresh fruits and vegetables, whole grains, lean protein, and low-fat dairy products. Limit your intake of foods with high amounts of sugar, saturated fats, and salt.  Take vitamin and mineral supplements as recommended by your health care provider.  Do not drink alcohol if your health care provider tells you not to drink.  If you drink alcohol: ? Limit how much you have to 0-2 drinks a day. ? Be aware of how much alcohol is in your drink. In the [place] one drink equals one 12 oz bottle of beer (355 mL), one 5 oz glass of wine (148 mL), or one 1 oz glass of hard liquor (44 mL). Lifestyle  Take daily care of your teeth and gums.  Stay active. Exercise for at least 30 minutes on 5 or more days each week.  Do not use any products that contain nicotine or tobacco, such as cigarettes, e-cigarettes, and chewing tobacco. If you need help quitting, ask your health care provider.  If you are sexually active, practice safe sex. Use a condom or other form of protection to prevent STIs (sexually transmitted infections).  Talk with your health care provider about taking a low-dose aspirin or statin. What's next?  Visit your health care provider once a year for a well check visit.  Ask your health care provider how often you should have your eyes and teeth checked.  Stay up to date on all vaccines. This information is not intended to replace advice given to you by your health care provider. Make sure you discuss any questions you have with  your health care provider. Document Revised: 08/23/2018 Document Reviewed: 08/23/2018 Elsevier Patient Education  2020 [employer].

## 2019-10-09 NOTE — Assessment & Plan Note (Signed)
Continue Lipitor 20 mg daily.  Check lipid panel.

## 2019-10-10 NOTE — Progress Notes (Signed)
Please inform patient of the following:  Blood work is all stable compared to last year. Would like for him to keep up the good work and we can recheck in a year.  Manuel Olson. [name], MD 10/10/2019 1:12 PM

## 2019-10-16 ENCOUNTER — Telehealth: Payer: Self-pay

## 2019-10-16 NOTE — Telephone Encounter (Signed)
Chart notes for patient CGM faxed to [facility] with confirmation.

## 2019-11-11 ENCOUNTER — Other Ambulatory Visit: Payer: Self-pay

## 2019-11-11 MED ORDER — PEN NEEDLES 31G X 6 MM MISC: 2 refills | Status: DC

## 2019-11-14 ENCOUNTER — Telehealth: Payer: Self-pay | Admitting: Cardiology

## 2019-11-14 MED ORDER — CLOPIDOGREL BISULFATE 75 MG PO TABS: 75.0000 mg | ORAL_TABLET | Freq: Every day | ORAL | 1 refills | Status: DC

## 2019-11-14 NOTE — Telephone Encounter (Signed)
Refill sent.

## 2019-11-14 NOTE — Telephone Encounter (Signed)
Needing refill for clopidogrel (PLAVIX) 75 MG tablet [[id number]]  Sent to [facility]   Pt is completely out

## 2019-11-21 ENCOUNTER — Telehealth: Payer: Self-pay | Admitting: Internal Medicine

## 2019-11-21 NOTE — Telephone Encounter (Signed)
Manuel Olson with [facility] ph# [phone] states she has not received most recent office notes for Continuation of Continuous Glucose Monitor and requests that the above be faxed to [facility] at her personal fax# [phone]

## 2019-11-21 NOTE — Telephone Encounter (Signed)
Despite the fact that I faxed the chart notes on Oct 16 2019 WITH confirmation it went through, they are telling me they did not receive the notes.  I will fax it again.

## 2019-12-26 ENCOUNTER — Inpatient Hospital Stay (HOSPITAL_BASED_OUTPATIENT_CLINIC_OR_DEPARTMENT_OTHER): Payer: Medicare Other | Admitting: Oncology

## 2019-12-26 ENCOUNTER — Other Ambulatory Visit: Payer: Self-pay

## 2019-12-26 ENCOUNTER — Inpatient Hospital Stay: Payer: Medicare Other | Attending: Oncology

## 2019-12-26 VITALS — BP 141/78 | HR 75 | Temp 98.3°F | Resp 17 | Ht 70.0 in | Wt 135.5 lb

## 2019-12-26 DIAGNOSIS — I252 Old myocardial infarction: Secondary | ICD-10-CM | POA: Diagnosis not present

## 2019-12-26 DIAGNOSIS — I25119 Atherosclerotic heart disease of native coronary artery with unspecified angina pectoris: Secondary | ICD-10-CM

## 2019-12-26 DIAGNOSIS — Z87891 Personal history of nicotine dependence: Secondary | ICD-10-CM | POA: Diagnosis not present

## 2019-12-26 DIAGNOSIS — C250 Malignant neoplasm of head of pancreas: Secondary | ICD-10-CM

## 2019-12-26 DIAGNOSIS — N289 Disorder of kidney and ureter, unspecified: Secondary | ICD-10-CM | POA: Diagnosis not present

## 2019-12-26 DIAGNOSIS — D696 Thrombocytopenia, unspecified: Secondary | ICD-10-CM | POA: Insufficient documentation

## 2019-12-26 DIAGNOSIS — D500 Iron deficiency anemia secondary to blood loss (chronic): Secondary | ICD-10-CM | POA: Insufficient documentation

## 2019-12-26 DIAGNOSIS — I350 Nonrheumatic aortic (valve) stenosis: Secondary | ICD-10-CM | POA: Diagnosis not present

## 2019-12-26 DIAGNOSIS — Z905 Acquired absence of kidney: Secondary | ICD-10-CM | POA: Insufficient documentation

## 2019-12-26 DIAGNOSIS — E119 Type 2 diabetes mellitus without complications: Secondary | ICD-10-CM | POA: Insufficient documentation

## 2019-12-26 DIAGNOSIS — Z8507 Personal history of malignant neoplasm of pancreas: Secondary | ICD-10-CM | POA: Insufficient documentation

## 2019-12-26 DIAGNOSIS — K284 Chronic or unspecified gastrojejunal ulcer with hemorrhage: Secondary | ICD-10-CM | POA: Diagnosis not present

## 2019-12-26 DIAGNOSIS — I2510 Atherosclerotic heart disease of native coronary artery without angina pectoris: Secondary | ICD-10-CM | POA: Diagnosis not present

## 2019-12-26 DIAGNOSIS — I10 Essential (primary) hypertension: Secondary | ICD-10-CM | POA: Diagnosis not present

## 2019-12-26 LAB — CBC WITH DIFFERENTIAL (CANCER CENTER ONLY)
Abs Immature Granulocytes: 0.01 10*3/uL (ref 0.00–0.07)
Basophils Absolute: 0 10*3/uL (ref 0.0–0.1)
Basophils Relative: 0 %
Eosinophils Absolute: 0.2 10*3/uL (ref 0.0–0.5)
Eosinophils Relative: 2 %
HCT: 28.6 % — ABNORMAL LOW (ref 39.0–52.0)
Hemoglobin: 9.1 g/dL — ABNORMAL LOW (ref 13.0–17.0)
Immature Granulocytes: 0 %
Lymphocytes Relative: 13 %
Lymphs Abs: 1.1 10*3/uL (ref 0.7–4.0)
MCH: 30.3 pg (ref 26.0–34.0)
MCHC: 31.8 g/dL (ref 30.0–36.0)
MCV: 95.3 fL (ref 80.0–100.0)
Monocytes Absolute: 1 10*3/uL (ref 0.1–1.0)
Monocytes Relative: 13 %
Neutro Abs: 5.7 10*3/uL (ref 1.7–7.7)
Neutrophils Relative %: 72 %
Platelet Count: 169 10*3/uL (ref 150–400)
RBC: 3 MIL/uL — ABNORMAL LOW (ref 4.22–5.81)
RDW: 13.7 % (ref 11.5–15.5)
WBC Count: 8 10*3/uL (ref 4.0–10.5)
nRBC: 0 % (ref 0.0–0.2)

## 2019-12-26 LAB — SAVE SMEAR(SSMR), FOR PROVIDER SLIDE REVIEW

## 2019-12-26 LAB — RETICULOCYTES
Immature Retic Fract: 12.2 % (ref 2.3–15.9)
RBC.: 3 MIL/uL — ABNORMAL LOW (ref 4.22–5.81)
Retic Count, Absolute: 49.5 10*3/uL (ref 19.0–186.0)
Retic Ct Pct: 1.6 % (ref 0.4–3.1)

## 2019-12-26 NOTE — Progress Notes (Addendum)
[facility] OFFICE PROGRESS NOTE   Diagnosis: Pancreas cancer  INTERVAL HISTORY:   [name] returns as scheduled.  He feels well.  Good appetite.  No diarrhea.  No bleeding.  No dyspnea.  He has occasional dizziness when standing up quickly.  No falls.  Objective:  Vital signs in last 24 hours:  Blood pressure (!) 141/78, pulse 75, temperature 98.3 F (36.8 C), temperature source Temporal, resp. rate 17, height 5' 10"  (1.778 m), weight 135 lb 8 oz (61.5 kg), SpO2 100 %.     Lymphatics: No cervical, supraclavicular, axillary, or inguinal nodes Resp: Lungs clear bilaterally Cardio: Regular rate and rhythm GI: No mass, no hepatosplenomegaly, nontender, no apparent ascites Vascular: No leg edema   Lab Results:  Lab Results  Component Value Date   WBC 8.0 12/26/2019   HGB 9.1 (L) 12/26/2019   HCT 28.6 (L) 12/26/2019   MCV 95.3 12/26/2019   PLT 169 12/26/2019   NEUTROABS 5.7 12/26/2019   Blood smear: The platelets appear normal in number. No large platelet clumps. The white cell morphology is unremarkable. Moderate number of ovalocytes, few teardrops and a acanthocytes. Rare helmet cells. The polychromasia is not increased. CMP  Lab Results  Component Value Date   NA 136 10/09/2019   K 5.0 10/09/2019   CL 104 10/09/2019   CO2 26 10/09/2019   GLUCOSE 226 (H) 10/09/2019   BUN 28 (H) 10/09/2019   CREATININE 1.51 (H) 10/09/2019   CALCIUM 8.9 10/09/2019   PROT 6.0 10/09/2019   ALBUMIN 4.0 10/09/2019   AST 22 10/09/2019   ALT 22 10/09/2019   ALKPHOS 99 10/09/2019   BILITOT 0.4 10/09/2019   GFRNONAA 39 (L) 08/27/2019   GFRAA 46 (L) 08/27/2019     Medications: I have reviewed the patient's current medications.   Assessment/Plan:  1. Adenocarcinoma of the pancreas head/neck, status post an EUS biopsy 08/02/2017 confirming adenocarcinoma ? EUS 08/02/2017-pancreas head/neck mass, abutment of the portal vein, no peripancreatic adenopathy ? MRI  abdomen 07/27/2017-head/neck pancreas mass with mass-effect on the portal splenic confluence, no involvement of the celiac axis or superior mesenteric artery, no evidence of metastatic disease ? PET scan 08/31/2017-very hypermetabolism at the pancreatic head mass, no evidence of metastatic disease. The isolated right lower lobe nodule below PET resolution, left sided renal mass with decreased activity relative to the normal adjacent kidney ? Cycle 1 FOLFIRINOX 09/16/2017 ? Cycle 2 FOLFOX 10/16/2017 ? Cycle 3 FOLFOX 10/30/2017 ? Cycle 4 FOLFIRINOX 11/14/2017 (irinotecan resumed with a dose reduction) ? Cycle 5 FOLFIRINOX3/20/2019 (Oxaliplatin dose reduced due to thrombocytopenia) ? Cycle 6 FOLFIRINOX4/11/2017 ? CTs 12/25/2017-stable right lower lobe nodule, decrease in size of the pancreas head mass, stable left kidney mass ? Pancreaticoduodenectomy and left nephrectomy 02/19/2018,[id number], 2/18 lymph nodes positive, lymphovascular and perineural invasion present, mild to moderate treatment effect, tumor involved adherent portal vein with negative portal vein margins ? Cycle 1 adjuvant gemcitabine/Abraxane 04/25/2018 ? Cycle 2 adjuvant gemcitabine/Abraxane 05/16/2018 ? Cycle 3 adjuvant gemcitabine/Abraxane 05/31/2018 ? Cycle 4 adjuvant gemcitabine/Abraxane 06/13/2018 ? Cycle 5 adjuvant gemcitabine/Abraxane 06/27/2018 ? Cycle 6 adjuvant gemcitabine/Abraxane 07/11/2018  2. 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  5.Diabetes  6.History of  tobacco use  7.Hypertension  8.Port-A-Cath placement 09/15/2017, Dr.  Barry Dienes  9. Diarrhea-potentially related to pancreas insufficiency, he began a trial of pancreatic enzyme replacement 05/16/2018: Diarrhea resolved  10. Dehydrationon 03/19/2018-statustreatments with IV fluidsforpast 2 weeks  Admission 04/03/2018 with dehydration and hypotension  Persistent orthostatic hypotension-compression stockings and Florinef added 7/30/2019improved, Florinef discontinued  11. 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  Mild thrombocytopenia-chronic    Disposition: [name] is in clinical remission from pancreas cancer.  We will follow up on the CA 19-9 from today.  The anemia has progressed since he was here in January.  He denies bleeding.  I will review the peripheral blood smear today. The anemia could be related to unrecognized GI blood loss, chronic renal insufficiency, or another etiology.  He will return for a CBC and additional evaluation in 2 weeks.  He will call for symptoms of anemia.  [name], MD  12/26/2019  9:15 AM We met

## 2019-12-26 NOTE — Patient Instructions (Signed)
Please provide copy of your Medical Advanced Directive to have scanned into your record.

## 2019-12-27 ENCOUNTER — Telehealth: Payer: Self-pay

## 2019-12-27 LAB — CANCER ANTIGEN 19-9: CA 19-9: 9 U/mL (ref 0–35)

## 2019-12-27 NOTE — Telephone Encounter (Signed)
-----   Message from Ladell Pier, MD sent at 12/27/2019  2:29 PM EDT ----- Please call patient, CA 19-9 is normal, follow-up as scheduled

## 2019-12-27 NOTE — Telephone Encounter (Signed)
TC to Pt spoke with Pt's wife relayed message from Dr. Benay Spice about CA-19.9 result was normal and to follow up as scheduled Pt.'s wife verbalized understanding.

## 2019-12-30 ENCOUNTER — Observation Stay (HOSPITAL_COMMUNITY): Payer: Medicare Other

## 2019-12-30 ENCOUNTER — Telehealth: Payer: Self-pay | Admitting: Emergency Medicine

## 2019-12-30 ENCOUNTER — Inpatient Hospital Stay (HOSPITAL_COMMUNITY)
Admission: EM | Admit: 2019-12-30 | Discharge: 2020-01-05 | DRG: 378 | Disposition: A | Discharge status: Home / Self Care | Payer: Medicare Other | Attending: Internal Medicine | Admitting: Internal Medicine

## 2019-12-30 ENCOUNTER — Other Ambulatory Visit: Payer: Self-pay

## 2019-12-30 ENCOUNTER — Encounter (HOSPITAL_COMMUNITY): Payer: Self-pay

## 2019-12-30 ENCOUNTER — Emergency Department (HOSPITAL_COMMUNITY): Payer: Medicare Other

## 2019-12-30 ENCOUNTER — Telehealth: Payer: Self-pay | Admitting: Cardiology

## 2019-12-30 DIAGNOSIS — K922 Gastrointestinal hemorrhage, unspecified: Secondary | ICD-10-CM | POA: Diagnosis not present

## 2019-12-30 DIAGNOSIS — E1159 Type 2 diabetes mellitus with other circulatory complications: Secondary | ICD-10-CM

## 2019-12-30 DIAGNOSIS — Z794 Long term (current) use of insulin: Secondary | ICD-10-CM

## 2019-12-30 DIAGNOSIS — K219 Gastro-esophageal reflux disease without esophagitis: Secondary | ICD-10-CM | POA: Diagnosis present

## 2019-12-30 DIAGNOSIS — Z7902 Long term (current) use of antithrombotics/antiplatelets: Secondary | ICD-10-CM

## 2019-12-30 DIAGNOSIS — Z8249 Family history of ischemic heart disease and other diseases of the circulatory system: Secondary | ICD-10-CM

## 2019-12-30 DIAGNOSIS — Z8507 Personal history of malignant neoplasm of pancreas: Secondary | ICD-10-CM

## 2019-12-30 DIAGNOSIS — I2510 Atherosclerotic heart disease of native coronary artery without angina pectoris: Secondary | ICD-10-CM | POA: Diagnosis not present

## 2019-12-30 DIAGNOSIS — Z20822 Contact with and (suspected) exposure to covid-19: Secondary | ICD-10-CM | POA: Diagnosis not present

## 2019-12-30 DIAGNOSIS — K5730 Diverticulosis of large intestine without perforation or abscess without bleeding: Secondary | ICD-10-CM | POA: Diagnosis not present

## 2019-12-30 DIAGNOSIS — R4020 Unspecified coma: Secondary | ICD-10-CM | POA: Diagnosis not present

## 2019-12-30 DIAGNOSIS — K284 Chronic or unspecified gastrojejunal ulcer with hemorrhage: Principal | ICD-10-CM | POA: Diagnosis present

## 2019-12-30 DIAGNOSIS — K921 Melena: Secondary | ICD-10-CM | POA: Diagnosis not present

## 2019-12-30 DIAGNOSIS — Z8701 Personal history of pneumonia (recurrent): Secondary | ICD-10-CM

## 2019-12-30 DIAGNOSIS — E1122 Type 2 diabetes mellitus with diabetic chronic kidney disease: Secondary | ICD-10-CM | POA: Diagnosis present

## 2019-12-30 DIAGNOSIS — Z952 Presence of prosthetic heart valve: Secondary | ICD-10-CM

## 2019-12-30 DIAGNOSIS — Z7982 Long term (current) use of aspirin: Secondary | ICD-10-CM

## 2019-12-30 DIAGNOSIS — C641 Malignant neoplasm of right kidney, except renal pelvis: Secondary | ICD-10-CM | POA: Diagnosis present

## 2019-12-30 DIAGNOSIS — E1165 Type 2 diabetes mellitus with hyperglycemia: Secondary | ICD-10-CM | POA: Diagnosis not present

## 2019-12-30 DIAGNOSIS — M1990 Unspecified osteoarthritis, unspecified site: Secondary | ICD-10-CM | POA: Diagnosis present

## 2019-12-30 DIAGNOSIS — Z905 Acquired absence of kidney: Secondary | ICD-10-CM

## 2019-12-30 DIAGNOSIS — N179 Acute kidney failure, unspecified: Secondary | ICD-10-CM | POA: Diagnosis present

## 2019-12-30 DIAGNOSIS — D649 Anemia, unspecified: Secondary | ICD-10-CM | POA: Diagnosis not present

## 2019-12-30 DIAGNOSIS — Z90411 Acquired partial absence of pancreas: Secondary | ICD-10-CM

## 2019-12-30 DIAGNOSIS — K449 Diaphragmatic hernia without obstruction or gangrene: Secondary | ICD-10-CM | POA: Diagnosis present

## 2019-12-30 DIAGNOSIS — Z974 Presence of external hearing-aid: Secondary | ICD-10-CM

## 2019-12-30 DIAGNOSIS — D62 Acute posthemorrhagic anemia: Secondary | ICD-10-CM | POA: Diagnosis present

## 2019-12-30 DIAGNOSIS — R55 Syncope and collapse: Secondary | ICD-10-CM | POA: Diagnosis not present

## 2019-12-30 DIAGNOSIS — N1832 Chronic kidney disease, stage 3b: Secondary | ICD-10-CM | POA: Diagnosis present

## 2019-12-30 DIAGNOSIS — I252 Old myocardial infarction: Secondary | ICD-10-CM

## 2019-12-30 DIAGNOSIS — I959 Hypotension, unspecified: Secondary | ICD-10-CM | POA: Diagnosis not present

## 2019-12-30 DIAGNOSIS — Z87891 Personal history of nicotine dependence: Secondary | ICD-10-CM

## 2019-12-30 DIAGNOSIS — C250 Malignant neoplasm of head of pancreas: Secondary | ICD-10-CM | POA: Diagnosis present

## 2019-12-30 DIAGNOSIS — Z972 Presence of dental prosthetic device (complete) (partial): Secondary | ICD-10-CM

## 2019-12-30 DIAGNOSIS — I714 Abdominal aortic aneurysm, without rupture: Secondary | ICD-10-CM | POA: Diagnosis present

## 2019-12-30 DIAGNOSIS — Z955 Presence of coronary angioplasty implant and graft: Secondary | ICD-10-CM

## 2019-12-30 DIAGNOSIS — E785 Hyperlipidemia, unspecified: Secondary | ICD-10-CM | POA: Diagnosis present

## 2019-12-30 DIAGNOSIS — Z85528 Personal history of other malignant neoplasm of kidney: Secondary | ICD-10-CM

## 2019-12-30 DIAGNOSIS — R42 Dizziness and giddiness: Secondary | ICD-10-CM | POA: Diagnosis not present

## 2019-12-30 DIAGNOSIS — Z79899 Other long term (current) drug therapy: Secondary | ICD-10-CM

## 2019-12-30 DIAGNOSIS — N200 Calculus of kidney: Secondary | ICD-10-CM | POA: Diagnosis not present

## 2019-12-30 DIAGNOSIS — Z8673 Personal history of transient ischemic attack (TIA), and cerebral infarction without residual deficits: Secondary | ICD-10-CM

## 2019-12-30 DIAGNOSIS — I129 Hypertensive chronic kidney disease with stage 1 through stage 4 chronic kidney disease, or unspecified chronic kidney disease: Secondary | ICD-10-CM | POA: Diagnosis present

## 2019-12-30 DIAGNOSIS — E119 Type 2 diabetes mellitus without complications: Secondary | ICD-10-CM | POA: Diagnosis present

## 2019-12-30 LAB — DIFFERENTIAL
Abs Immature Granulocytes: 0.06 10*3/uL (ref 0.00–0.07)
Basophils Absolute: 0 10*3/uL (ref 0.0–0.1)
Basophils Relative: 0 %
Eosinophils Absolute: 0 10*3/uL (ref 0.0–0.5)
Eosinophils Relative: 0 %
Immature Granulocytes: 1 %
Lymphocytes Relative: 11 %
Lymphs Abs: 1.4 10*3/uL (ref 0.7–4.0)
Monocytes Absolute: 1.1 10*3/uL — ABNORMAL HIGH (ref 0.1–1.0)
Monocytes Relative: 9 %
Neutro Abs: 9.7 10*3/uL — ABNORMAL HIGH (ref 1.7–7.7)
Neutrophils Relative %: 79 %

## 2019-12-30 LAB — TROPONIN I (HIGH SENSITIVITY)
Troponin I (High Sensitivity): 18 ng/L — ABNORMAL HIGH (ref <18)
Troponin I (High Sensitivity): 17 ng/L (ref <18)

## 2019-12-30 LAB — COMPREHENSIVE METABOLIC PANEL
ALT: 18 U/L (ref 0–44)
AST: 18 U/L (ref 15–41)
Albumin: 3 g/dL — ABNORMAL LOW (ref 3.5–5.0)
Alkaline Phosphatase: 70 U/L (ref 38–126)
Anion gap: 10 (ref 5–15)
BUN: 56 mg/dL — ABNORMAL HIGH (ref 8–23)
CO2: 19 mmol/L — ABNORMAL LOW (ref 22–32)
Calcium: 8.1 mg/dL — ABNORMAL LOW (ref 8.9–10.3)
Chloride: 101 mmol/L (ref 98–111)
Creatinine, Ser: 1.87 mg/dL — ABNORMAL HIGH (ref 0.61–1.24)
GFR calc Af Amer: 40 mL/min — ABNORMAL LOW (ref >60)
GFR calc non Af Amer: 34 mL/min — ABNORMAL LOW (ref >60)
Glucose, Bld: 212 mg/dL — ABNORMAL HIGH (ref 70–99)
Potassium: 5.4 mmol/L — ABNORMAL HIGH (ref 3.5–5.1)
Sodium: 130 mmol/L — ABNORMAL LOW (ref 135–145)
Total Bilirubin: 0.5 mg/dL (ref 0.3–1.2)
Total Protein: 5.1 g/dL — ABNORMAL LOW (ref 6.5–8.1)

## 2019-12-30 LAB — PROTIME-INR
INR: 1.2 (ref 0.8–1.2)
Prothrombin Time: 15 seconds (ref 11.4–15.2)

## 2019-12-30 LAB — VITAMIN B12: Vitamin B-12: 174 pg/mL — ABNORMAL LOW (ref 180–914)

## 2019-12-30 LAB — I-STAT CHEM 8, ED
BUN: 54 mg/dL — ABNORMAL HIGH (ref 8–23)
Calcium, Ion: 1.16 mmol/L (ref 1.15–1.40)
Chloride: 101 mmol/L (ref 98–111)
Creatinine, Ser: 1.8 mg/dL — ABNORMAL HIGH (ref 0.61–1.24)
Glucose, Bld: 205 mg/dL — ABNORMAL HIGH (ref 70–99)
HCT: 15 % — ABNORMAL LOW (ref 39.0–52.0)
Hemoglobin: 5.1 g/dL — CL (ref 13.0–17.0)
Potassium: 5.3 mmol/L — ABNORMAL HIGH (ref 3.5–5.1)
Sodium: 131 mmol/L — ABNORMAL LOW (ref 135–145)
TCO2: 19 mmol/L — ABNORMAL LOW (ref 22–32)

## 2019-12-30 LAB — CBC
HCT: 17.4 % — ABNORMAL LOW (ref 39.0–52.0)
Hemoglobin: 5.2 g/dL — CL (ref 13.0–17.0)
MCH: 30.1 pg (ref 26.0–34.0)
MCHC: 29.9 g/dL — ABNORMAL LOW (ref 30.0–36.0)
MCV: 100.6 fL — ABNORMAL HIGH (ref 80.0–100.0)
Platelets: 180 10*3/uL (ref 150–400)
RBC: 1.73 MIL/uL — ABNORMAL LOW (ref 4.22–5.81)
RDW: 13.9 % (ref 11.5–15.5)
WBC: 12.2 10*3/uL — ABNORMAL HIGH (ref 4.0–10.5)
nRBC: 0 % (ref 0.0–0.2)

## 2019-12-30 LAB — IRON AND TIBC
Iron: 12 ug/dL — ABNORMAL LOW (ref 45–182)
Saturation Ratios: 3 % — ABNORMAL LOW (ref 17.9–39.5)
TIBC: 379 ug/dL (ref 250–450)
UIBC: 367 ug/dL

## 2019-12-30 LAB — RETICULOCYTES
Immature Retic Fract: 33 % — ABNORMAL HIGH (ref 2.3–15.9)
RBC.: 1.66 MIL/uL — ABNORMAL LOW (ref 4.22–5.81)
Retic Count, Absolute: 52.3 10*3/uL (ref 19.0–186.0)
Retic Ct Pct: 3.2 % — ABNORMAL HIGH (ref 0.4–3.1)

## 2019-12-30 LAB — RESPIRATORY PANEL BY RT PCR (FLU A&B, COVID)
Influenza A by PCR: NEGATIVE
Influenza B by PCR: NEGATIVE
SARS Coronavirus 2 by RT PCR: NEGATIVE

## 2019-12-30 LAB — FOLATE: Folate: 18.6 ng/mL (ref >5.9)

## 2019-12-30 LAB — FERRITIN: Ferritin: 8 ng/mL — ABNORMAL LOW (ref 24–336)

## 2019-12-30 LAB — CBG MONITORING, ED: Glucose-Capillary: 191 mg/dL — ABNORMAL HIGH (ref 70–99)

## 2019-12-30 LAB — APTT: aPTT: 27 seconds (ref 24–36)

## 2019-12-30 LAB — POC OCCULT BLOOD, ED: Fecal Occult Bld: POSITIVE — AB

## 2019-12-30 LAB — PREPARE RBC (CROSSMATCH)

## 2019-12-30 LAB — ETHANOL: Alcohol, Ethyl (B): <10 mg/dL (ref <10)

## 2019-12-30 MED ORDER — ONDANSETRON HCL 4 MG PO TABS: 4.0000 mg | ORAL_TABLET | Freq: Four times a day (QID) | ORAL | Status: DC | PRN

## 2019-12-30 MED ORDER — METHYLPREDNISOLONE SODIUM SUCC 125 MG IJ SOLR
125.0000 mg | Freq: Once | INTRAMUSCULAR | Status: AC
  Administered 2019-12-30: 125 mg via INTRAVENOUS
  Filled 2019-12-30: qty 2

## 2019-12-30 MED ORDER — SODIUM CHLORIDE 0.9 % IV SOLN
10.0000 mL/h | Freq: Once | INTRAVENOUS | Status: AC
  Administered 2019-12-30: 10 mL/h via INTRAVENOUS

## 2019-12-30 MED ORDER — SODIUM CHLORIDE 0.9 % IV SOLN: 10.0000 mL/h | Freq: Once | INTRAVENOUS | Status: DC

## 2019-12-30 MED ORDER — INSULIN ASPART 100 UNIT/ML ~~LOC~~ SOLN
0.0000 [IU] | SUBCUTANEOUS | Status: DC
  Administered 2019-12-31 (×2): 2 [IU] via SUBCUTANEOUS
  Administered 2019-12-31: 5 [IU] via SUBCUTANEOUS
  Administered 2019-12-31 (×2): 2 [IU] via SUBCUTANEOUS
  Administered 2019-12-31: 3 [IU] via SUBCUTANEOUS
  Administered 2020-01-01: 1 [IU] via SUBCUTANEOUS
  Administered 2020-01-01 – 2020-01-02 (×5): 2 [IU] via SUBCUTANEOUS
  Administered 2020-01-02: 1 [IU] via SUBCUTANEOUS
  Administered 2020-01-02: 2 [IU] via SUBCUTANEOUS
  Administered 2020-01-02: 1 [IU] via SUBCUTANEOUS
  Administered 2020-01-03: 5 [IU] via SUBCUTANEOUS
  Administered 2020-01-03 (×2): 2 [IU] via SUBCUTANEOUS
  Administered 2020-01-03: 5 [IU] via SUBCUTANEOUS
  Administered 2020-01-04: 2 [IU] via SUBCUTANEOUS
  Administered 2020-01-04: 5 [IU] via SUBCUTANEOUS
  Administered 2020-01-04: 1 [IU] via SUBCUTANEOUS
  Administered 2020-01-04: 2 [IU] via SUBCUTANEOUS
  Administered 2020-01-04: 3 [IU] via SUBCUTANEOUS
  Administered 2020-01-05: 2 [IU] via SUBCUTANEOUS
  Administered 2020-01-05: 1 [IU] via SUBCUTANEOUS
  Filled 2019-12-30: qty 0.09

## 2019-12-30 MED ORDER — PANTOPRAZOLE SODIUM 40 MG IV SOLR
40.0000 mg | Freq: Once | INTRAVENOUS | Status: AC
  Administered 2019-12-30: 40 mg via INTRAVENOUS
  Filled 2019-12-30: qty 40

## 2019-12-30 MED ORDER — ONDANSETRON HCL 4 MG/2ML IJ SOLN: 4.0000 mg | Freq: Four times a day (QID) | INTRAMUSCULAR | Status: DC | PRN

## 2019-12-30 MED ORDER — SODIUM CHLORIDE 0.9% FLUSH
3.0000 mL | Freq: Once | INTRAVENOUS | Status: AC
  Administered 2019-12-30: 3 mL via INTRAVENOUS

## 2019-12-30 MED ORDER — FAMOTIDINE IN NACL 20-0.9 MG/50ML-% IV SOLN
20.0000 mg | Freq: Once | INTRAVENOUS | Status: AC
  Administered 2019-12-30: 20 mg via INTRAVENOUS
  Filled 2019-12-30: qty 50

## 2019-12-30 MED ORDER — DIPHENHYDRAMINE HCL 50 MG/ML IJ SOLN
50.0000 mg | Freq: Once | INTRAMUSCULAR | Status: AC
  Administered 2019-12-30: 50 mg via INTRAVENOUS
  Filled 2019-12-30: qty 1

## 2019-12-30 NOTE — Telephone Encounter (Signed)
Wife notified and states that pt is no longer taking Losartan

## 2019-12-30 NOTE — H&P (Signed)
History and Physical    MUSTAPHA SALADIN XBJ:[id number] DOB: 06-16-1946 DOA: 12/30/2019  PCP: Ardith Dark, MD  Patient coming from: Home.  Chief Complaint: Weakness.  HPI: Manuel Olson is a 74 y.o. male with history of CAD status post stenting, status post TAVR, hypertension diabetes mellitus, pancreatic cancer status post Whipple's procedure, nephrectomy for renal cell cancer presents to the ER the patient was feeling weak and dizzy and hypotensive at home.  At the home patient also had a large amount of black tarry stool.  Denies taking any NSAIDs.  Patient is on aspirin and Plavix CAD.  Over the last 1 week patient has been having some abdominal discomfort mostly epigastric area denies any vomiting.  ED Course: In the ER blood work show hemoglobin of 5.2 which dropped from 9.1 about 4 days ago.  Stool for occult blood was positive and melanotic.  ER physician discussed with on-call of our gastroenterologist who advised transfusion and placing patient on Protonix and will be seeing patient in consult for EGD.  On exam patient abdomen appears benign.  EKG shows normal sinus rhythm.  Labs show acute on chronic kidney disease stage III with creatinine around 1.8 which is worsened from 1.5 few months ago.  High-sensitivity troponin was negative Covid test was negative.  Review of Systems: As per HPI, rest all negative.   Past Medical History:  Diagnosis Date  . Arthritis   . CAD (coronary artery disease)    a. 11/2016 in [place]: STEMI s/p DES to mid LAD, DES to diagonal, DES x 2 to proximal and mid RCA b. 08/2019: NSTEMI with DES to distal RCA  . Essential hypertension   . Full dentures   . GERD (gastroesophageal reflux disease)   . History of stroke   . Hyperlipidemia   . Myocardial infarction (HCC)   . Pancreatic cancer (HCC)   . Pneumonia   . Renal cell carcinoma (HCC)   . S/P TAVR (transcatheter aortic valve replacement)    a. 08/2017:  Edwards Sapien 3 THV (size 26 mm,  model #[id number] , serial # [id number])  . Severe aortic stenosis    a. 08/2017: s/p TAVR by Dr. Clifton James and Dr. Laneta Simmers.   . Type 2 diabetes mellitus (HCC)   . Wears glasses   . Wears hearing aid     Past Surgical History:  Procedure Laterality Date  . APPENDECTOMY    . CARDIAC CATHETERIZATION    . CORONARY ANGIOGRAPHY N/A 08/23/2019   Procedure: CORONARY ANGIOGRAPHY;  Surgeon: Lyn Records, MD;  Location: [facility] INVASIVE CV LAB;  Service: Cardiovascular;  Laterality: N/A;  . CORONARY STENT INTERVENTION N/A 08/23/2019   Procedure: CORONARY STENT INTERVENTION;  Surgeon: Lyn Records, MD;  Location: [facility] INVASIVE CV LAB;  Service: Cardiovascular;  Laterality: N/A;  . CORONARY STENT PLACEMENT     x 4 in March 2018  . EUS N/A 08/02/2017   Procedure: UPPER ENDOSCOPIC ULTRASOUND (EUS) RADIAL;  Surgeon: Rachael Fee, MD;  Location: [facility] ENDOSCOPY;  Service: Endoscopy;  Laterality: N/A;  . EYE SURGERY Left   . HAND SURGERY Bilateral   . HERNIA REPAIR Right   . LAPAROSCOPY N/A 02/19/2018   Procedure: DIAGNOSTIC LAPAROSCOPY, ERAS PATHWAY;  Surgeon: Almond Lint, MD;  Location: [facility] OR;  Service: General;  Laterality: N/A;  . MULTIPLE TOOTH EXTRACTIONS    . NEPHRECTOMY Left 02/19/2018   Procedure: OPEN LEFT RADICAL NEPHRECTOMY;  Surgeon: Malen Gauze, MD;  Location: [facility] OR;  Service:  Urology;  Laterality: Left;  . PORT-A-CATH REMOVAL N/A 06/13/2019   Procedure: PORT REMOVAL;  Surgeon: Almond Lint, MD;  Location: [facility] OR;  Service: General;  Laterality: N/A;  . PORTACATH PLACEMENT N/A 09/15/2017   Procedure: INSERTION PORT-A-CATH;  Surgeon: Almond Lint, MD;  Location: [facility];  Service: General;  Laterality: N/A;  . RIGHT/LEFT HEART CATH AND CORONARY ANGIOGRAPHY N/A 07/05/2017   Procedure: RIGHT/LEFT HEART CATH AND CORONARY ANGIOGRAPHY;  Surgeon: Tonny Bollman, MD;  Location: [facility] INVASIVE CV LAB;  Service: Cardiovascular;  Laterality: N/A;  . SHOULDER ARTHROSCOPY WITH ROTATOR  CUFF REPAIR Left   . TEE WITHOUT CARDIOVERSION N/A 08/22/2017   Procedure: TRANSESOPHAGEAL ECHOCARDIOGRAM (TEE);  Surgeon: Kathleene Hazel, MD;  Location: [facility] OR;  Service: Open Heart Surgery;  Laterality: N/A;  . TRANSCATHETER AORTIC VALVE REPLACEMENT, TRANSFEMORAL N/A 08/22/2017   Procedure: TRANSCATHETER AORTIC VALVE REPLACEMENT, TRANSFEMORAL;  Surgeon: Kathleene Hazel, MD;  Location: [facility] OR;  Service: Open Heart Surgery;  Laterality: N/A;  . WHIPPLE PROCEDURE N/A 02/19/2018   Procedure: WHIPPLE PROCEDURE;  Surgeon: Almond Lint, MD;  Location: [facility] OR;  Service: General;  Laterality: N/A;     reports that he quit smoking about 3 years ago. His smoking use included cigarettes. He started smoking about 61 years ago. He has a 57.00 pack-year smoking history. He has never used smokeless tobacco. He reports that he does not drink alcohol or use drugs.  No Known Allergies  Family History  Problem Relation Age of Onset  . Lupus Mother   . CAD Brother   . Hypertension Brother     Prior to Admission medications   Medication Sig Start Date End Date Taking? Authorizing Provider  aspirin 81 MG chewable tablet Chew 1 tablet (81 mg total) by mouth daily. 08/25/19  Yes Buriev, Isaiah Serge, MD  atorvastatin (LIPITOR) 20 MG tablet TAKE 1 TABLET BY MOUTH ONCE DAILY. 06/27/19  Yes Jonelle Sidle, MD  clopidogrel (PLAVIX) 75 MG tablet Take 1 tablet (75 mg total) by mouth daily with breakfast. 11/14/19  Yes Jonelle Sidle, MD  CREON 36000 units CPEP capsule TAKE 1 CAPSULE BY MOUTH BEFORE EACH MEAL AND SNACK. TAKE UP TO 6 CAPSULES PER DAY. Patient taking differently: Take 36,000 Units by mouth See admin instructions. Take 1 capsule before each meal and with snacks. 04/09/19  Yes Ladene Artist, MD  Insulin Glargine (BASAGLAR KWIKPEN) 100 UNIT/ML SOPN Inject 0.1 mLs (10 Units total) into the skin at bedtime. Patient taking differently: Inject 9 Units into the skin daily.  01/24/19  Yes  Carlus Pavlov, MD  insulin lispro (HUMALOG KWIKPEN) 100 UNIT/ML KwikPen Inject 3-5 units 3 times a day with meals. Patient taking differently: Inject 5 Units into the skin 3 (three) times daily before meals. Inject 5-8 units 3 times a day with meals. 01/24/19  Yes Carlus Pavlov, MD  Insulin Pen Needle (PEN NEEDLES) 31G X 6 MM MISC Use as directed to check blood sugar 4 times a day. 11/11/19   Carlus Pavlov, MD  nitroGLYCERIN (NITROSTAT) 0.4 MG SL tablet Place 1 tablet (0.4 mg total) under the tongue every 5 (five) minutes as needed for chest pain. Patient not taking: Reported on 12/26/2019 08/24/19   Esperanza Sheets, MD    Physical Exam: Constitutional: Moderately built and nourished. Vitals:   12/30/19 2230 12/30/19 2240 12/30/19 2307 12/30/19 2330  BP: 122/66 (!) 116/58 128/69 124/65  Pulse: 92 95 93 93  Resp: 16 17 20 18   Temp:  98.2 F (36.8 C) 97.7 F (36.5 C)   TempSrc:  Oral Oral   SpO2: 100% 100% 100% 100%  Weight:      Height:       Eyes: Anicteric no pallor. ENMT: No discharge from the ears eyes nose or mouth. Neck: No mass felt.  No neck rigidity. Respiratory: No rhonchi or crepitations. Cardiovascular: S1-S2 heard. Abdomen: Soft nontender bowel sound present. Musculoskeletal: No edema. Skin: No rash. Neurologic: Alert awake oriented to time place and person.  Moves all extremities. Psychiatric: Appears normal but normal affect.   Labs on Admission: I have personally reviewed following labs and imaging studies  CBC: Recent Labs  Lab 12/26/19 0846 12/30/19 1925 12/30/19 1937  WBC 8.0 12.2*  --   NEUTROABS 5.7 9.7*  --   HGB 9.1* 5.2* 5.1*  HCT 28.6* 17.4* 15.0*  MCV 95.3 100.6*  --   PLT 169 180  --    Basic Metabolic Panel: Recent Labs  Lab 12/30/19 1925 12/30/19 1937  NA 130* 131*  K 5.4* 5.3*  CL 101 101  CO2 19*  --   GLUCOSE 212* 205*  BUN 56* 54*  CREATININE 1.87* 1.80*  CALCIUM 8.1*  --    GFR: Estimated Creatinine Clearance:  30.4 mL/min (A) (by C-G formula based on SCr of 1.8 mg/dL (H)). Liver Function Tests: Recent Labs  Lab 12/30/19 1925  AST 18  ALT 18  ALKPHOS 70  BILITOT 0.5  PROT 5.1*  ALBUMIN 3.0*   No results for input(s): LIPASE, AMYLASE in the last 168 hours. No results for input(s): AMMONIA in the last 168 hours. Coagulation Profile: Recent Labs  Lab 12/30/19 1925  INR 1.2   Cardiac Enzymes: No results for input(s): CKTOTAL, CKMB, CKMBINDEX, TROPONINI in the last 168 hours. BNP (last 3 results) No results for input(s): PROBNP in the last 8760 hours. HbA1C: No results for input(s): HGBA1C in the last 72 hours. CBG: Recent Labs  Lab 12/30/19 1844  GLUCAP 191*   Lipid Profile: No results for input(s): CHOL, HDL, LDLCALC, TRIG, CHOLHDL, LDLDIRECT in the last 72 hours. Thyroid Function Tests: No results for input(s): TSH, T4TOTAL, FREET4, T3FREE, THYROIDAB in the last 72 hours. Anemia Panel: Recent Labs    12/30/19 2008  VITAMINB12 174*  FOLATE 18.6  FERRITIN 8*  TIBC 379  IRON 12*  RETICCTPCT 3.2*   Urine analysis:    Component Value Date/Time   COLORURINE YELLOW 04/16/2018 0938   APPEARANCEUR CLEAR 04/16/2018 0938   LABSPEC 1.010 04/16/2018 0938   PHURINE 5.0 04/16/2018 0938   GLUCOSEU NEGATIVE 04/16/2018 0938   HGBUR NEGATIVE 04/16/2018 0938   BILIRUBINUR NEGATIVE 04/16/2018 0938   KETONESUR NEGATIVE 04/16/2018 0938   PROTEINUR NEGATIVE 04/16/2018 0938   NITRITE NEGATIVE 04/16/2018 0938   LEUKOCYTESUR NEGATIVE 04/16/2018 0938   Sepsis Labs: @LABRCNTIP (procalcitonin:4,lacticidven:4) )No results found for this or any previous visit (from the past 240 hour(s)).   Radiological Exams on Admission: DG Chest 2 View  Result Date: 12/30/2019 CLINICAL DATA:  Dizziness EXAM: CHEST - 2 VIEW COMPARISON:  08/22/2019 FINDINGS: Aortic valve stent endograft. Both lungs are clear. Disc degenerative disease and ankylosis of the thoracic spine. IMPRESSION: No acute abnormality of  the lungs.  Aortic valve stent endograft. Electronically Signed   By: Lauralyn Primes M.D.   On: 12/30/2019 20:35    EKG: Independently reviewed.  Normal sinus rhythm.  Assessment/Plan Principal Problem:   Acute GI bleeding Active Problems:   Cancer of head of pancreas (HCC)  s/p Whipple 02/2018   Severe AS S/P TAVR    Poorly controlled type 2 diabetes mellitus with circulatory disorder [facility])   Renal cell carcinoma of right kidney (HCC) s/p nephrectomy 02/2018    1. Acute GI bleeding likely upper GI bleed given that patient has melanotic stool.  Patient has 2 units of PRBC transfused on Protonix infusion we will keep patient n.p.o. in anticipation of EGD.  [facility] has been consulted.  Since patient had epigastric discomfort I ordered a CT abdomen. 2. Acute blood loss anemia follow CBC after transfusion. 3. History of CAD status post stenting presently n.p.o. and holding antiplatelet agents due to acute GI bleeding.  Denies any chest pain. 4. History of pancreatic cancer status post Whipple's procedure. 5. History of renal cell cancer status post nephrectomy. 6. Diabetes mellitus type 2 we will keep patient on sliding scale coverage for now.  Patient does take long-acting insulin 9 units in the morning. 7. Status post TAVR. 8. Acute on chronic kidney disease stage III likely worsened because of bleed.   DVT prophylaxis: SCDs.  Avoiding anticoagulation due to acute GI bleed. Code Status: Full code. Family Communication: Patient's wife. Disposition Plan: Home. Consults called: Gastroenterologist. Admission status: Observation.   Eduard Clos MD [facility] Pager [phone].  If 7PM-7AM, please contact night-coverage [email or web address] Password [facility]  12/30/2019, 11:49 PM

## 2019-12-30 NOTE — ED Notes (Signed)
Dr.Kakrakandy verified that 2 units of RBCs are to be transfused. Blood Bank called regarding order.

## 2019-12-30 NOTE — Telephone Encounter (Signed)
Received f/u call from pt's wife.  Pt states that his heart is pounding now but has no CP or tightness in chest or numbness/tingling/radiation to neck/jaw or arm.  Wife asking if pt should take sublingual nitro tablet at this time.  Advised wife to hold off on taking nitro tablet d/t lack of chest pain and continue with plans to go to ER.  Wife verbalized understanding and denies any further questions/concerns at this time, states they are heading to the [facility] now.  MD Sherrill aware.

## 2019-12-30 NOTE — ED Notes (Signed)
Date and time results received: 12/30/19 1946 (use smartphrase ".now" to insert current time)  Test: hgb Critical Value: 5.2  Name of Provider Notified: Roderic Palau, MD   Orders Received? Or Actions Taken?: Orders Received - See Orders for details   Cameron Ali lab

## 2019-12-30 NOTE — Telephone Encounter (Signed)
Received call from pt's wife reporting that husband woke up this morning with severe dizziness & fatigue.  Denies any falls or changes in LOC.  Denies any other neurological changes or CP/SOB or N/V/D or fever/chills.  Per wife they contacted his cardiologist and were advised to stop taking any BP meds that he has, however pt does not have any BP meds at this time.  They were then advised to contact [facility].  Pt reports feeling flushed/hot and taking BP of 88/55.  Per MD Benay Spice pt advised to go to [facility] for evaluation (and to call EMS if needed).  Wife verbalized understanding of instructions and knows to call back as needed.

## 2019-12-30 NOTE — ED Triage Notes (Addendum)
Patient c/o dizziness, weakness, and hypotension since waking this AM. Patient is not currently receiving cancer treatment.  Patient states he also feels disoriented. Patient's wife reports that his BP and has 98-06'H diastolic today. BP in triage was 116/52

## 2019-12-30 NOTE — Telephone Encounter (Signed)
Spoke with wife who states pt woke up very dizzy. Pt's BP this morning with 89/40. They then put on his compression stockings and checked his BP an hour prior to our call and it was noted to be 111/66. Pt is still dizzy and weak.Pt had CBC done and and available in chart. Please advise.

## 2019-12-30 NOTE — ED Provider Notes (Signed)
[facility] DEPT Provider Note   CSN: [id number] Arrival date & time: 12/30/19  1806     History Chief Complaint  Patient presents with  . Dizziness  . Weakness    IZSAK MEIR is a 74 y.o. male with a past medical history of hypertension, pancreatic cancer status post Whipple procedure, last chemotherapy 1 year ago, diabetes, presenting to the ED with a chief complaint of dizziness.  Woke up this morning with dizziness, feeling like the room is spinning around him.  He reports headache that began since arrival to the ED as well as tingling in his tongue and throat.  He did pull a tick off of his skin yesterday.  He was in his usual state of health when he went to bed last night and woke up with the symptoms this morning.  Wife states that he had similar symptoms in the past as a side effect of his chemotherapy but he has not received this in the past year.  Denies history of CVA, injuries or falls.  Patient states that dizziness is worse with movement of his head and ambulation.  Denies any vomiting, chest pain, abdominal pain, numbness in arms or legs or suspicious food ingestions.  HPI     Past Medical History:  Diagnosis Date  . Arthritis   . CAD (coronary artery disease)    a. 11/2016 in [place]: STEMI s/p DES to mid LAD, DES to diagonal, DES x 2 to proximal and mid RCA b. 08/2019: NSTEMI with DES to distal RCA  . Essential hypertension   . Full dentures   . GERD (gastroesophageal reflux disease)   . History of stroke   . Hyperlipidemia   . Myocardial infarction ([place])   . Pancreatic cancer ([place])   . Pneumonia   . Renal cell carcinoma ([place])   . S/P TAVR (transcatheter aortic valve replacement)    a. 08/2017:  Edwards Sapien 3 THV (size 26 mm, model #9600CM26A , serial # [id number])  . Severe aortic stenosis    a. 08/2017: s/p TAVR by Dr. Angelena Form and Dr. Cyndia Bent.   . Type 2 diabetes mellitus ([place])   . Wears glasses   . Wears hearing aid      Patient Active Problem List   Diagnosis Date Noted  . Acute GI bleeding 12/30/2019  . Angina pectoris ([place]) 08/22/2019  . Protein-calorie malnutrition, severe 04/05/2018  . Debility 02/27/2018  . Port-A-Cath in place 10/10/2017  . Renal cell carcinoma of right kidney [facility]) s/p nephrectomy 02/2018 10/02/2017  . Ileitis, terminal ([place]) 09/28/2017  . Poorly controlled type 2 diabetes mellitus with circulatory disorder ([place]) 08/25/2017  . Mixed hyperlipidemia 08/25/2017  . Essential hypertension, benign   . CAD (coronary artery disease)   . Former smoker   . Severe AS S/P TAVR  08/22/2017  . Cancer of head of pancreas [facility]) s/p Whipple 02/2018 08/10/2017    Past Surgical History:  Procedure Laterality Date  . APPENDECTOMY    . CARDIAC CATHETERIZATION    . CORONARY ANGIOGRAPHY N/A 08/23/2019   Procedure: CORONARY ANGIOGRAPHY;  Surgeon: Belva Crome, MD;  Location: [facility];  Service: Cardiovascular;  Laterality: N/A;  . CORONARY STENT INTERVENTION N/A 08/23/2019   Procedure: CORONARY STENT INTERVENTION;  Surgeon: Belva Crome, MD;  Location: [facility];  Service: Cardiovascular;  Laterality: N/A;  . CORONARY STENT PLACEMENT     x 4 in March 2018  . EUS N/A 08/02/2017   Procedure:  UPPER ENDOSCOPIC ULTRASOUND (EUS) RADIAL;  Surgeon: [name], MD;  Location: [facility];  Service: Endoscopy;  Laterality: N/A;  . EYE SURGERY Left   . HAND SURGERY Bilateral   . HERNIA REPAIR Right   . LAPAROSCOPY N/A 02/19/2018   Procedure: DIAGNOSTIC LAPAROSCOPY, ERAS PATHWAY;  Surgeon: [name], MD;  Location: [facility];  Service: General;  Laterality: N/A;  . MULTIPLE TOOTH EXTRACTIONS    . NEPHRECTOMY Left 02/19/2018   Procedure: OPEN LEFT RADICAL NEPHRECTOMY;  Surgeon: Cleon Gustin, MD;  Location: [facility];  Service: Urology;  Laterality: Left;  . PORT-A-CATH REMOVAL N/A 06/13/2019   Procedure: PORT REMOVAL;  Surgeon: [name], MD;  Location: [facility];   Service: General;  Laterality: N/A;  . PORTACATH PLACEMENT N/A 09/15/2017   Procedure: INSERTION PORT-A-CATH;  Surgeon: [name], MD;  Location: [facility];  Service: General;  Laterality: N/A;  . RIGHT/LEFT HEART CATH AND CORONARY ANGIOGRAPHY N/A 07/05/2017   Procedure: RIGHT/LEFT HEART CATH AND CORONARY ANGIOGRAPHY;  Surgeon: [name], MD;  Location: [facility];  Service: Cardiovascular;  Laterality: N/A;  . SHOULDER ARTHROSCOPY WITH ROTATOR CUFF REPAIR Left   . TEE WITHOUT CARDIOVERSION N/A 08/22/2017   Procedure: TRANSESOPHAGEAL ECHOCARDIOGRAM (TEE);  Surgeon: Burnell Blanks, MD;  Location: [facility];  Service: Open Heart Surgery;  Laterality: N/A;  . TRANSCATHETER AORTIC VALVE REPLACEMENT, TRANSFEMORAL N/A 08/22/2017   Procedure: TRANSCATHETER AORTIC VALVE REPLACEMENT, TRANSFEMORAL;  Surgeon: Burnell Blanks, MD;  Location: [facility];  Service: Open Heart Surgery;  Laterality: N/A;  . WHIPPLE PROCEDURE N/A 02/19/2018   Procedure: WHIPPLE PROCEDURE;  Surgeon: [name], MD;  Location: [facility] OR;  Service: General;  Laterality: N/A;       Family History  Problem Relation Age of Onset  . Lupus Mother   . CAD Brother   . Hypertension Brother     Social History   Tobacco Use  . Smoking status: Former Smoker    Packs/day: 1.00    Years: 57.00    Pack years: 57.00    Types: Cigarettes    Start date: 09/12/1958    Quit date: 04/12/2016    Years since quitting: 3.7  . Smokeless tobacco: Never Used  Substance Use Topics  . Alcohol use: No  . Drug use: No    Home Medications Prior to Admission medications   Medication Sig Start Date End Date Taking? Authorizing Provider  aspirin 81 MG chewable tablet Chew 1 tablet (81 mg total) by mouth daily. 08/25/19  Yes Buriev, Arie Sabina, MD  atorvastatin (LIPITOR) 20 MG tablet TAKE 1 TABLET BY MOUTH ONCE DAILY. 06/27/19  Yes Satira Sark, MD  clopidogrel (PLAVIX) 75 MG tablet Take 1 tablet (75 mg  total) by mouth daily with breakfast. 11/14/19  Yes Satira Sark, MD  CREON 36000 units CPEP capsule TAKE 1 CAPSULE BY MOUTH BEFORE EACH MEAL AND SNACK. TAKE UP TO 6 CAPSULES PER DAY. Patient taking differently: Take 36,000 Units by mouth See admin instructions. Take 1 capsule before each meal and with snacks. 04/09/19  Yes Ladell Pier, MD  Insulin Glargine (BASAGLAR KWIKPEN) 100 UNIT/ML SOPN Inject 0.1 mLs (10 Units total) into the skin at bedtime. Patient taking differently: Inject 9 Units into the skin daily.  01/24/19  Yes Philemon Kingdom, MD  insulin lispro (HUMALOG KWIKPEN) 100 UNIT/ML KwikPen Inject 3-5 units 3 times a day with meals. Patient taking differently: Inject 5 Units into the skin 3 (three) times  daily before meals. Inject 5-8 units 3 times a day with meals. 01/24/19  Yes Philemon Kingdom, MD  Insulin Pen Needle (PEN NEEDLES) 31G X 6 MM MISC Use as directed to check blood sugar 4 times a day. 11/11/19   Philemon Kingdom, MD  nitroGLYCERIN (NITROSTAT) 0.4 MG SL tablet Place 1 tablet (0.4 mg total) under the tongue every 5 (five) minutes as needed for chest pain. Patient not taking: Reported on 12/26/2019 08/24/19   Kinnie Feil, MD    Allergies    Patient has no known allergies.  Review of Systems   Review of Systems  Constitutional: Negative for appetite change, chills and fever.  HENT: Negative for ear pain, rhinorrhea, sneezing and sore throat.   Eyes: Negative for photophobia and visual disturbance.  Respiratory: Negative for cough, chest tightness, shortness of breath and wheezing.   Cardiovascular: Negative for chest pain and palpitations.  Gastrointestinal: Negative for abdominal pain, blood in stool, constipation, diarrhea, nausea and vomiting.  Genitourinary: Negative for dysuria, hematuria and urgency.  Musculoskeletal: Negative for myalgias.  Skin: Negative for rash.  Neurological: Positive for dizziness and headaches. Negative for weakness and  light-headedness.    Physical Exam Updated Vital Signs BP (!) 133/52   Pulse 90   Temp 97.6 F (36.4 C) (Oral)   Resp 15   Ht 5' 10"  (1.778 m)   Wt 61.5 kg   SpO2 100%   BMI 19.44 kg/m   Physical Exam Vitals and nursing note reviewed.  Constitutional:      General: He is not in acute distress.    Appearance: He is well-developed.     Comments: Appears anxious.  HENT:     Head: Normocephalic and atraumatic.     Nose: Nose normal.     Mouth/Throat:     Comments: Slightly enlarged tongue. No other signs of angioedema or anaphylaxis. Eyes:     General: No scleral icterus.       Right eye: No discharge.        Left eye: No discharge.     Conjunctiva/sclera: Conjunctivae normal.  Cardiovascular:     Rate and Rhythm: Normal rate and regular rhythm.     Heart sounds: Normal heart sounds. No murmur. No friction rub. No gallop.   Pulmonary:     Effort: Pulmonary effort is normal. No respiratory distress.     Breath sounds: Normal breath sounds.  Abdominal:     General: Bowel sounds are normal. There is no distension.     Palpations: Abdomen is soft.     Tenderness: There is no abdominal tenderness. There is no guarding.  Musculoskeletal:        General: Normal range of motion.     Cervical back: Normal range of motion and neck supple.  Skin:    General: Skin is warm and dry.     Coloration: Skin is pale.     Findings: No rash.     Comments: Single erythematous tick bite noted to R thigh.  Neurological:     General: No focal deficit present.     Mental Status: He is alert and oriented to person, place, and time.     Cranial Nerves: No cranial nerve deficit.     Sensory: No sensory deficit.     Motor: No weakness or abnormal muscle tone.     Coordination: Coordination normal.     Comments: Pupils reactive. No facial asymmetry noted. Cranial nerves appear grossly intact. Sensation intact to light touch  on face, BUE and BLE. Strength 5/5 in BUE and BLE.     ED Results /  Procedures / Treatments   Labs (all labs ordered are listed, but only abnormal results are displayed) Labs Reviewed  CBC - Abnormal; Notable for the following components:      Result Value   WBC 12.2 (*)    RBC 1.73 (*)    Hemoglobin 5.2 (*)    HCT 17.4 (*)    MCV 100.6 (*)    MCHC 29.9 (*)    All other components within normal limits  DIFFERENTIAL - Abnormal; Notable for the following components:   Neutro Abs 9.7 (*)    Monocytes Absolute 1.1 (*)    All other components within normal limits  COMPREHENSIVE METABOLIC PANEL - Abnormal; Notable for the following components:   Sodium 130 (*)    Potassium 5.4 (*)    CO2 19 (*)    Glucose, Bld 212 (*)    BUN 56 (*)    Creatinine, Ser 1.87 (*)    Calcium 8.1 (*)    Total Protein 5.1 (*)    Albumin 3.0 (*)    GFR calc non Af Amer 34 (*)    GFR calc Af Amer 40 (*)    All other components within normal limits  VITAMIN B12 - Abnormal; Notable for the following components:   Vitamin B-12 174 (*)    All other components within normal limits  IRON AND TIBC - Abnormal; Notable for the following components:   Iron 12 (*)    Saturation Ratios 3 (*)    All other components within normal limits  FERRITIN - Abnormal; Notable for the following components:   Ferritin 8 (*)    All other components within normal limits  RETICULOCYTES - Abnormal; Notable for the following components:   Retic Ct Pct 3.2 (*)    RBC. 1.66 (*)    Immature Retic Fract 33.0 (*)    All other components within normal limits  CBG MONITORING, ED - Abnormal; Notable for the following components:   Glucose-Capillary 191 (*)    All other components within normal limits  I-STAT CHEM 8, ED - Abnormal; Notable for the following components:   Sodium 131 (*)    Potassium 5.3 (*)    BUN 54 (*)    Creatinine, Ser 1.80 (*)    Glucose, Bld 205 (*)    TCO2 19 (*)    Hemoglobin 5.1 (*)    HCT 15.0 (*)    All other components within normal limits  POC OCCULT BLOOD, ED -  Abnormal; Notable for the following components:   Fecal Occult Bld POSITIVE (*)    All other components within normal limits  TROPONIN I (HIGH SENSITIVITY) - Abnormal; Notable for the following components:   Troponin I (High Sensitivity) 18 (*)    All other components within normal limits  RESPIRATORY PANEL BY RT PCR (FLU A&B, COVID)  ETHANOL  PROTIME-INR  APTT  FOLATE  URINALYSIS, ROUTINE W REFLEX MICROSCOPIC  TYPE AND SCREEN  PREPARE RBC (CROSSMATCH)  PREPARE RBC (CROSSMATCH)  TROPONIN I (HIGH SENSITIVITY)    EKG None  Radiology DG Chest 2 View  Result Date: 12/30/2019 CLINICAL DATA:  Dizziness EXAM: CHEST - 2 VIEW COMPARISON:  08/22/2019 FINDINGS: Aortic valve stent endograft. Both lungs are clear. Disc degenerative disease and ankylosis of the thoracic spine. IMPRESSION: No acute abnormality of the lungs.  Aortic valve stent endograft. Electronically Signed   By: Cristie Hem  Laqueta Carina M.D.   On: 12/30/2019 20:35    Procedures .Critical Care Performed by: Delia Heady, PA-C Authorized by: Delia Heady, PA-C   Critical care provider statement:    Critical care time (minutes):  35   Critical care was necessary to treat or prevent imminent or life-threatening deterioration of the following conditions:  Cardiac failure, circulatory failure, CNS failure or compromise, renal failure, respiratory failure, sepsis and shock   Critical care was time spent personally by me on the following activities:  Development of treatment plan with patient or surrogate, discussions with consultants, evaluation of patient's response to treatment, examination of patient, obtaining history from patient or surrogate, ordering and review of laboratory studies, ordering and performing treatments and interventions, ordering and review of radiographic studies, pulse oximetry, re-evaluation of patient's condition and review of old charts   I assumed direction of critical care for this patient from another provider in  my specialty: no     (including critical care time)  Medications Ordered in ED Medications  0.9 %  sodium chloride infusion (0 mL/hr Intravenous Hold 12/30/19 2048)  0.9 %  sodium chloride infusion (has no administration in time range)  sodium chloride flush (NS) 0.9 % injection 3 mL (3 mLs Intravenous Given 12/30/19 1921)  diphenhydrAMINE (BENADRYL) injection 50 mg (50 mg Intravenous Given 12/30/19 1920)  famotidine (PEPCID) IVPB 20 mg premix (0 mg Intravenous Stopped 12/30/19 2010)  methylPREDNISolone sodium succinate (SOLU-MEDROL) 125 mg/2 mL injection 125 mg (125 mg Intravenous Given 12/30/19 1920)  pantoprazole (PROTONIX) injection 40 mg (40 mg Intravenous Given 12/30/19 2047)    ED Course  I have reviewed the triage vital signs and the nursing notes.  Pertinent labs & imaging results that were available during my care of the patient were reviewed by me and considered in my medical decision making (see chart for details).  Clinical Course as of Dec 30 2151  Mon Dec 30, 2019  1932 Patient given Benadryl, Solu-Medrol and Pepcid.  States that his swelling and tingling sensation has improved.  He appears drowsy I believe 2/2 IV benadryl.   [HK]  1946 Will obtain type and screen while awaiting labs CBC to verify.  Hemoglobin(!!): 5.1 [HK]  1947 Chart review shows that hemoglobin was 9.1 on 12/26/2019.   [HK]  2033 Fecal Occult Blood, POC(!): POSITIVE [HK]  2033 Hemoglobin(!!): 5.2 [HK]  2034 I have ordered a blood transfusion, will consult [facility] regarding his melena.   [HK]  2104 Spoke to Dr. Havery Moros of GI.  States that she keep patient n.p.o., continue to give Protonix and he will evaluate the patient in the morning.  We will need to consult him if anything changes overnight.  I have contacted Dr. Hal Hope regarding his recommendations. Will transfuse 3 units.   [HK]    Clinical Course User Index [HK] Delia Heady, PA-C   MDM Rules/Calculators/A&P                       74 year old male with past medical history of pancreatic cancer status post Whipple procedure 1 year ago, no longer receiving treatment presenting to the ED with a chief complaint of dizziness.  Reports dizziness and hypotension since waking up this morning.  Patient reports that dizziness is worse with moving his head.  He did note when he arrived to the ED that he started having a headache as well as tingling to his tongue and lips.  No obvious signs of angioedema or anaphylaxis noted  on exam.  His neurological exam is without any deficits.  He is hard of hearing which is baseline.  Patient does appear pale and generally weak.  He is not hypotensive here.  Lab work significant for hemoglobin of 5.2 which is a four-point drop in the past 4 days based on chart review.  No history of GI bleed in the past.  There is melena noted on my exam. Patient remains HDS.  We will transfuse 3 units of blood.  I have consulted GI who will evaluate the patient and admitted him to the hospitalist service.  All imaging, if done today, including plain films, CT scans, and ultrasounds, independently reviewed by me, and interpretations confirmed via formal radiology reads.   Portions of this note were generated with [employer]. Dictation errors may occur despite best attempts at proofreading.  Final Clinical Impression(s) / ED Diagnoses Final diagnoses:  Gastrointestinal hemorrhage with melena  Symptomatic anemia    Rx / DC Orders ED Discharge Orders    None       Delia Heady, PA-C 12/30/19 2153    [name] Ferguson, MD 12/30/19 2232

## 2019-12-30 NOTE — Telephone Encounter (Signed)
For now I would have him hold the losartan completely.  I do see that his hemoglobin is down to 9.1 compared to 11.4 a few months ago, please make sure that he is following this with the cancer clinic.

## 2019-12-30 NOTE — Telephone Encounter (Signed)
Please give pt's wife a call concerning pt's BP

## 2019-12-31 ENCOUNTER — Telehealth: Payer: Self-pay | Admitting: Oncology

## 2019-12-31 DIAGNOSIS — Z905 Acquired absence of kidney: Secondary | ICD-10-CM | POA: Diagnosis not present

## 2019-12-31 DIAGNOSIS — K219 Gastro-esophageal reflux disease without esophagitis: Secondary | ICD-10-CM | POA: Diagnosis present

## 2019-12-31 DIAGNOSIS — D62 Acute posthemorrhagic anemia: Secondary | ICD-10-CM | POA: Diagnosis present

## 2019-12-31 DIAGNOSIS — I129 Hypertensive chronic kidney disease with stage 1 through stage 4 chronic kidney disease, or unspecified chronic kidney disease: Secondary | ICD-10-CM | POA: Diagnosis present

## 2019-12-31 DIAGNOSIS — Z972 Presence of dental prosthetic device (complete) (partial): Secondary | ICD-10-CM | POA: Diagnosis not present

## 2019-12-31 DIAGNOSIS — K921 Melena: Secondary | ICD-10-CM | POA: Diagnosis not present

## 2019-12-31 DIAGNOSIS — Z974 Presence of external hearing-aid: Secondary | ICD-10-CM | POA: Diagnosis not present

## 2019-12-31 DIAGNOSIS — Z952 Presence of prosthetic heart valve: Secondary | ICD-10-CM | POA: Diagnosis not present

## 2019-12-31 DIAGNOSIS — M1990 Unspecified osteoarthritis, unspecified site: Secondary | ICD-10-CM | POA: Diagnosis present

## 2019-12-31 DIAGNOSIS — Z8507 Personal history of malignant neoplasm of pancreas: Secondary | ICD-10-CM | POA: Diagnosis not present

## 2019-12-31 DIAGNOSIS — Z20822 Contact with and (suspected) exposure to covid-19: Secondary | ICD-10-CM | POA: Diagnosis present

## 2019-12-31 DIAGNOSIS — Z955 Presence of coronary angioplasty implant and graft: Secondary | ICD-10-CM | POA: Diagnosis not present

## 2019-12-31 DIAGNOSIS — I252 Old myocardial infarction: Secondary | ICD-10-CM | POA: Diagnosis not present

## 2019-12-31 DIAGNOSIS — N1832 Chronic kidney disease, stage 3b: Secondary | ICD-10-CM | POA: Diagnosis present

## 2019-12-31 DIAGNOSIS — E1122 Type 2 diabetes mellitus with diabetic chronic kidney disease: Secondary | ICD-10-CM | POA: Diagnosis present

## 2019-12-31 DIAGNOSIS — Z90411 Acquired partial absence of pancreas: Secondary | ICD-10-CM | POA: Diagnosis not present

## 2019-12-31 DIAGNOSIS — E1165 Type 2 diabetes mellitus with hyperglycemia: Secondary | ICD-10-CM | POA: Diagnosis present

## 2019-12-31 DIAGNOSIS — K922 Gastrointestinal hemorrhage, unspecified: Secondary | ICD-10-CM | POA: Diagnosis not present

## 2019-12-31 DIAGNOSIS — I714 Abdominal aortic aneurysm, without rupture: Secondary | ICD-10-CM | POA: Diagnosis present

## 2019-12-31 DIAGNOSIS — K449 Diaphragmatic hernia without obstruction or gangrene: Secondary | ICD-10-CM | POA: Diagnosis present

## 2019-12-31 DIAGNOSIS — D649 Anemia, unspecified: Secondary | ICD-10-CM | POA: Diagnosis not present

## 2019-12-31 DIAGNOSIS — E785 Hyperlipidemia, unspecified: Secondary | ICD-10-CM | POA: Diagnosis present

## 2019-12-31 DIAGNOSIS — N179 Acute kidney failure, unspecified: Secondary | ICD-10-CM | POA: Diagnosis present

## 2019-12-31 DIAGNOSIS — Z7902 Long term (current) use of antithrombotics/antiplatelets: Secondary | ICD-10-CM | POA: Diagnosis not present

## 2019-12-31 DIAGNOSIS — E1159 Type 2 diabetes mellitus with other circulatory complications: Secondary | ICD-10-CM | POA: Diagnosis present

## 2019-12-31 DIAGNOSIS — Z85528 Personal history of other malignant neoplasm of kidney: Secondary | ICD-10-CM | POA: Diagnosis not present

## 2019-12-31 DIAGNOSIS — K284 Chronic or unspecified gastrojejunal ulcer with hemorrhage: Secondary | ICD-10-CM | POA: Diagnosis present

## 2019-12-31 DIAGNOSIS — I2510 Atherosclerotic heart disease of native coronary artery without angina pectoris: Secondary | ICD-10-CM | POA: Diagnosis present

## 2019-12-31 LAB — CBC
HCT: 21.8 % — ABNORMAL LOW (ref 39.0–52.0)
Hemoglobin: 7 g/dL — ABNORMAL LOW (ref 13.0–17.0)
MCH: 29.9 pg (ref 26.0–34.0)
MCHC: 32.1 g/dL (ref 30.0–36.0)
MCV: 93.2 fL (ref 80.0–100.0)
Platelets: 113 10*3/uL — ABNORMAL LOW (ref 150–400)
RBC: 2.34 MIL/uL — ABNORMAL LOW (ref 4.22–5.81)
RDW: 15 % (ref 11.5–15.5)
WBC: 6.1 10*3/uL (ref 4.0–10.5)
nRBC: 0 % (ref 0.0–0.2)

## 2019-12-31 LAB — URINALYSIS, ROUTINE W REFLEX MICROSCOPIC
Bacteria, UA: NONE SEEN
Bilirubin Urine: NEGATIVE
Glucose, UA: NEGATIVE mg/dL
Hgb urine dipstick: NEGATIVE
Ketones, ur: 5 mg/dL — AB
Nitrite: NEGATIVE
Protein, ur: NEGATIVE mg/dL
Specific Gravity, Urine: 1.014 (ref 1.005–1.030)
pH: 5 (ref 5.0–8.0)

## 2019-12-31 LAB — BASIC METABOLIC PANEL
Anion gap: 7 (ref 5–15)
BUN: 55 mg/dL — ABNORMAL HIGH (ref 8–23)
CO2: 19 mmol/L — ABNORMAL LOW (ref 22–32)
Calcium: 7.2 mg/dL — ABNORMAL LOW (ref 8.9–10.3)
Chloride: 110 mmol/L (ref 98–111)
Creatinine, Ser: 1.59 mg/dL — ABNORMAL HIGH (ref 0.61–1.24)
GFR calc Af Amer: 48 mL/min — ABNORMAL LOW (ref >60)
GFR calc non Af Amer: 42 mL/min — ABNORMAL LOW (ref >60)
Glucose, Bld: 175 mg/dL — ABNORMAL HIGH (ref 70–99)
Potassium: 4.3 mmol/L (ref 3.5–5.1)
Sodium: 136 mmol/L (ref 135–145)

## 2019-12-31 LAB — CBG MONITORING, ED
Glucose-Capillary: 158 mg/dL — ABNORMAL HIGH (ref 70–99)
Glucose-Capillary: 169 mg/dL — ABNORMAL HIGH (ref 70–99)
Glucose-Capillary: 193 mg/dL — ABNORMAL HIGH (ref 70–99)
Glucose-Capillary: 203 mg/dL — ABNORMAL HIGH (ref 70–99)

## 2019-12-31 LAB — ABO/RH: ABO/RH(D): A POS

## 2019-12-31 LAB — HEMOGLOBIN: Hemoglobin: 7.2 g/dL — ABNORMAL LOW (ref 13.0–17.0)

## 2019-12-31 LAB — GLUCOSE, CAPILLARY
Glucose-Capillary: 152 mg/dL — ABNORMAL HIGH (ref 70–99)
Glucose-Capillary: 254 mg/dL — ABNORMAL HIGH (ref 70–99)

## 2019-12-31 LAB — PREPARE RBC (CROSSMATCH)

## 2019-12-31 MED ORDER — SODIUM CHLORIDE 0.9% IV SOLUTION: Freq: Once | INTRAVENOUS | Status: AC

## 2019-12-31 MED ORDER — SODIUM CHLORIDE 0.9 % IV SOLN
8.0000 mg/h | INTRAVENOUS | Status: DC
  Administered 2019-12-31 – 2020-01-02 (×6): 8 mg/h via INTRAVENOUS
  Filled 2019-12-31 (×9): qty 80

## 2019-12-31 MED ORDER — SODIUM CHLORIDE 0.9 % IV SOLN
80.0000 mg | Freq: Once | INTRAVENOUS | Status: AC
  Administered 2019-12-31: 80 mg via INTRAVENOUS
  Filled 2019-12-31: qty 80

## 2019-12-31 MED ORDER — PANTOPRAZOLE SODIUM 40 MG IV SOLR
40.0000 mg | Freq: Two times a day (BID) | INTRAVENOUS | Status: DC
  Administered 2020-01-03 – 2020-01-04 (×3): 40 mg via INTRAVENOUS
  Filled 2019-12-31 (×4): qty 40

## 2019-12-31 NOTE — Consult Note (Addendum)
Consultation  Referring Provider:   Dr. Zigmund Daniel Primary Care Physician:  [name], MD Primary Gastroenterologist: Dr. [name]       Reason for Consultation:   Upper GI bleed         HPI:   Manuel Olson is a 74 y.o. male with a past medical history as listed below including CAD status post stenting, status post TAVR, recent non-ST elevation MI 08/23/2019, stent placed at time of cath, pancreatic cancer status post Whipple procedure and nephrectomy for renal cell cancer, who presented to the ER on 12/30/2019 with a complaint of feeling weak and dizzy and hypotensive at home.    Today, the patient is found laying in bed with his wife by his bedside, they explain that yesterday he got up and just was "not himself".  She tells me he was weak and dizzy and slightly disoriented.  He also had a very low blood pressure.  She called the cardiologist who finally got back in touch with them around 3 PM and told him to stop one of his blood pressure medications, but "he was not even on that one".  He then passed a stool and "missed the toilet with some of it".  When taking it off the floor his wife noticed that it was black.  The patient continued to get weak and worse so she called the EMS who transported him to the ER.  He has not had another bowel movement since then.  He is on Aspirin and Plavix for CAD.  The aspirin was just started in December after his last stent placement.  Denies NSAID use.  Over the past week he has had some abdominal discomfort mostly in the epigastric area with no vomiting, tells me he thought he pulled something when moving a heavy object.    Denies fever, chills, nausea, vomiting, heartburn or reflux.  ER course: Hemoglobin 5.2 (9.1 4 days ago), stool for occult blood was positive and melanotic, patient placed on Protonix and blood transfusion ordered, EKG in sinus rhythm, labs show acute on chronic kidney disease stage III with creatinine around 1.8 (1.5 a few  months ago), Covid test negative  GI history: 09/29/2017 consult in hospital for nausea vomiting and abdominal pain: At that time it was noted that the patient had an EUS 08/02/2017 with Dr. [name] for pancreatic mass and PD dilation, 18 x 25 mm irregular mass in the neck of the pancreas, abutting main PV, cytology adenocarcinoma; diagnosed with chemo-induced distal enteritis kept on Cipro/Flagyl  Past Medical History:  Diagnosis Date  . Arthritis   . CAD (coronary artery disease)    a. 11/2016 in [place]: STEMI s/p DES to mid LAD, DES to diagonal, DES x 2 to proximal and mid RCA b. 08/2019: NSTEMI with DES to distal RCA  . Essential hypertension   . Full dentures   . GERD (gastroesophageal reflux disease)   . History of stroke   . Hyperlipidemia   . Myocardial infarction ([place])   . Pancreatic cancer ([place])   . Pneumonia   . Renal cell carcinoma ([place])   . S/P TAVR (transcatheter aortic valve replacement)    a. 08/2017:  Edwards Sapien 3 THV (size 26 mm, model #9600CM26A , serial # [id number])  . Severe aortic stenosis    a. 08/2017: s/p TAVR by Dr. Angelena Form and Dr. Cyndia Bent.   . Type 2 diabetes mellitus ([place])   . Wears glasses   . Wears hearing aid  Past Surgical History:  Procedure Laterality Date  . APPENDECTOMY    . CARDIAC CATHETERIZATION    . CORONARY ANGIOGRAPHY N/A 08/23/2019   Procedure: CORONARY ANGIOGRAPHY;  Surgeon: Belva Crome, MD;  Location: [facility];  Service: Cardiovascular;  Laterality: N/A;  . CORONARY STENT INTERVENTION N/A 08/23/2019   Procedure: CORONARY STENT INTERVENTION;  Surgeon: Belva Crome, MD;  Location: [facility];  Service: Cardiovascular;  Laterality: N/A;  . CORONARY STENT PLACEMENT     x 4 in March 2018  . EUS N/A 08/02/2017   Procedure: UPPER ENDOSCOPIC ULTRASOUND (EUS) RADIAL;  Surgeon: [name], MD;  Location: [facility];  Service: Endoscopy;  Laterality: N/A;  . EYE SURGERY Left   . HAND SURGERY Bilateral   . HERNIA  REPAIR Right   . LAPAROSCOPY N/A 02/19/2018   Procedure: DIAGNOSTIC LAPAROSCOPY, ERAS PATHWAY;  Surgeon: [name], MD;  Location: [facility];  Service: General;  Laterality: N/A;  . MULTIPLE TOOTH EXTRACTIONS    . NEPHRECTOMY Left 02/19/2018   Procedure: OPEN LEFT RADICAL NEPHRECTOMY;  Surgeon: Cleon Gustin, MD;  Location: [facility];  Service: Urology;  Laterality: Left;  . PORT-A-CATH REMOVAL N/A 06/13/2019   Procedure: PORT REMOVAL;  Surgeon: [name], MD;  Location: [facility];  Service: General;  Laterality: N/A;  . PORTACATH PLACEMENT N/A 09/15/2017   Procedure: INSERTION PORT-A-CATH;  Surgeon: [name], MD;  Location: [facility];  Service: General;  Laterality: N/A;  . RIGHT/LEFT HEART CATH AND CORONARY ANGIOGRAPHY N/A 07/05/2017   Procedure: RIGHT/LEFT HEART CATH AND CORONARY ANGIOGRAPHY;  Surgeon: [name], MD;  Location: [facility];  Service: Cardiovascular;  Laterality: N/A;  . SHOULDER ARTHROSCOPY WITH ROTATOR CUFF REPAIR Left   . TEE WITHOUT CARDIOVERSION N/A 08/22/2017   Procedure: TRANSESOPHAGEAL ECHOCARDIOGRAM (TEE);  Surgeon: Burnell Blanks, MD;  Location: [facility];  Service: Open Heart Surgery;  Laterality: N/A;  . TRANSCATHETER AORTIC VALVE REPLACEMENT, TRANSFEMORAL N/A 08/22/2017   Procedure: TRANSCATHETER AORTIC VALVE REPLACEMENT, TRANSFEMORAL;  Surgeon: Burnell Blanks, MD;  Location: [facility];  Service: Open Heart Surgery;  Laterality: N/A;  . WHIPPLE PROCEDURE N/A 02/19/2018   Procedure: WHIPPLE PROCEDURE;  Surgeon: [name], MD;  [facility];  Service: General;  Laterality: N/A;    Family History  Problem Relation Age of Onset  . Lupus Mother   . CAD Brother   . Hypertension Brother     Social History   Tobacco Use  . Smoking status: Former Smoker    Packs/day: 1.00    Years: 57.00    Pack years: 57.00    Types: Cigarettes    Start date: 09/12/1958    Quit date: 04/12/2016    Years since quitting: 3.7  .  Smokeless tobacco: Never Used  Substance Use Topics  . Alcohol use: No  . Drug use: No    Prior to Admission medications   Medication Sig Start Date End Date Taking? Authorizing Provider  aspirin 81 MG chewable tablet Chew 1 tablet (81 mg total) by mouth daily. 08/25/19  Yes Buriev, Arie Sabina, MD  atorvastatin (LIPITOR) 20 MG tablet TAKE 1 TABLET BY MOUTH ONCE DAILY. 06/27/19  Yes Satira Sark, MD  clopidogrel (PLAVIX) 75 MG tablet Take 1 tablet (75 mg total) by mouth daily with breakfast. 11/14/19  Yes Satira Sark, MD  CREON 36000 units CPEP capsule TAKE 1 CAPSULE BY MOUTH BEFORE EACH MEAL AND SNACK. TAKE UP TO 6  CAPSULES PER DAY. Patient taking differently: Take 36,000 Units by mouth See admin instructions. Take 1 capsule before each meal and with snacks. 04/09/19  Yes Ladell Pier, MD  Insulin Glargine (BASAGLAR KWIKPEN) 100 UNIT/ML SOPN Inject 0.1 mLs (10 Units total) into the skin at bedtime. Patient taking differently: Inject 9 Units into the skin daily.  01/24/19  Yes Philemon Kingdom, MD  insulin lispro (HUMALOG KWIKPEN) 100 UNIT/ML KwikPen Inject 3-5 units 3 times a day with meals. Patient taking differently: Inject 5 Units into the skin 3 (three) times daily before meals. Inject 5-8 units 3 times a day with meals. 01/24/19  Yes Philemon Kingdom, MD  Insulin Pen Needle (PEN NEEDLES) 31G X 6 MM MISC Use as directed to check blood sugar 4 times a day. 11/11/19   Philemon Kingdom, MD  nitroGLYCERIN (NITROSTAT) 0.4 MG SL tablet Place 1 tablet (0.4 mg total) under the tongue every 5 (five) minutes as needed for chest pain. Patient not taking: Reported on 12/26/2019 08/24/19   Kinnie Feil, MD    Current Facility-Administered Medications  Medication Dose Route Frequency Provider Last Rate Last Admin  . 0.9 %  sodium chloride infusion  10 mL/hr Intravenous Once Rise Patience, MD   Stopped at 12/30/19 2048  . insulin aspart (novoLOG) injection 0-9 Units  0-9 Units  Subcutaneous Q4H Rise Patience, MD   2 Units at 12/31/19 0830  . ondansetron (ZOFRAN) tablet 4 mg  4 mg Oral Q6H PRN Rise Patience, MD       Or  . ondansetron [facility]) injection 4 mg  4 mg Intravenous Q6H PRN Rise Patience, MD      . pantoprazole (PROTONIX) 80 mg in sodium chloride 0.9 % 100 mL (0.8 mg/mL) infusion  8 mg/hr Intravenous Continuous Rise Patience, MD 10 mL/hr at 12/31/19 0543 8 mg/hr at 12/31/19 0543  . [START ON 01/03/2020] pantoprazole (PROTONIX) injection 40 mg  40 mg Intravenous Q12H Rise Patience, MD       Current Outpatient Medications  Medication Sig Dispense Refill  . aspirin 81 MG chewable tablet Chew 1 tablet (81 mg total) by mouth daily. 30 tablet 2  . atorvastatin (LIPITOR) 20 MG tablet TAKE 1 TABLET BY MOUTH ONCE DAILY. 90 tablet 3  . clopidogrel (PLAVIX) 75 MG tablet Take 1 tablet (75 mg total) by mouth daily with breakfast. 90 tablet 1  . CREON 36000 units CPEP capsule TAKE 1 CAPSULE BY MOUTH BEFORE EACH MEAL AND SNACK. TAKE UP TO 6 CAPSULES PER DAY. (Patient taking differently: Take 36,000 Units by mouth See admin instructions. Take 1 capsule before each meal and with snacks.) 90 capsule 0  . Insulin Glargine (BASAGLAR KWIKPEN) 100 UNIT/ML SOPN Inject 0.1 mLs (10 Units total) into the skin at bedtime. (Patient taking differently: Inject 9 Units into the skin daily. ) 5 pen 5  . insulin lispro (HUMALOG KWIKPEN) 100 UNIT/ML KwikPen Inject 3-5 units 3 times a day with meals. (Patient taking differently: Inject 5 Units into the skin 3 (three) times daily before meals. Inject 5-8 units 3 times a day with meals.) 15 mL 11  . Insulin Pen Needle (PEN NEEDLES) 31G X 6 MM MISC Use as directed to check blood sugar 4 times a day. 400 each 2  . nitroGLYCERIN (NITROSTAT) 0.4 MG SL tablet Place 1 tablet (0.4 mg total) under the tongue every 5 (five) minutes as needed for chest pain. (Patient not taking: Reported on 12/26/2019) 20 tablet 0  Allergies as of 12/30/2019  . (No Known Allergies)     Review of Systems:    Constitutional: No weight loss, fever or chills +weakness Skin: No rash  Cardiovascular: No chest pain Respiratory: No SOB  Gastrointestinal: See HPI and otherwise negative Genitourinary: No dysuria Neurological: +dizziness Musculoskeletal: No new muscle or joint pain Hematologic: No bruising Psychiatric: No history of depression or anxiety    Physical Exam:  Vital signs in last 24 hours: Temp:  [97.6 F (36.4 C)-98.9 F (37.2 C)] 97.9 F (36.6 C) (04/20 0615) Pulse Rate:  [75-103] 81 (04/20 0730) Resp:  [11-25] 20 (04/20 0730) BP: (106-157)/(52-98) 141/74 (04/20 0730) SpO2:  [98 %-100 %] 99 % (04/20 0730) Weight:  [61.5 kg] 61.5 kg (04/19 1820)   General:   Pleasant elderly Caucasian male appears to be in NAD, Well developed, Well nourished, alert and cooperative Head:  Normocephalic and atraumatic. Eyes:   PEERL, EOMI. No icterus. Conjunctiva pink. Ears:  Normal auditory acuity. Neck:  Supple Throat: Oral cavity and pharynx without inflammation, swelling or lesion. Teeth in good condition. Lungs: Respirations even and unlabored. Lungs clear to auscultation bilaterally.   No wheezes, crackles, or rhonchi.  Heart: Normal S1, S2. No MRG. Regular rate and rhythm. No peripheral edema, cyanosis or pallor.  Abdomen:  Soft, nondistended, nontender. No rebound or guarding. Normal bowel sounds. No appreciable masses or hepatomegaly. Rectal:  Not performed.  Msk:  Symmetrical without gross deformities. Peripheral pulses intact.  Extremities:  Without edema, no deformity or joint abnormality.  Neurologic:  Alert and  oriented x4;  grossly normal neurologically.   Skin:   Dry and intact without significant lesions or rashes. Psychiatric: Demonstrates good judgement and reason without abnormal affect or behaviors.   LAB RESULTS: CBC Latest Ref Rng & Units 12/31/2019 12/30/2019 12/30/2019  WBC 4.0 -  10.5 K/uL 6.1 - 12.2(H)  Hemoglobin 13.0 - 17.0 g/dL 7.0(L) 5.1(LL) 5.2(LL)  Hematocrit 39.0 - 52.0 % 21.8(L) 15.0(L) 17.4(L)  Platelets 150 - 400 K/uL 113(L) - 180    BMET Recent Labs    12/30/19 1925 12/30/19 1937  NA 130* 131*  K 5.4* 5.3*  CL 101 101  CO2 19*  --   GLUCOSE 212* 205*  BUN 56* 54*  CREATININE 1.87* 1.80*  CALCIUM 8.1*  --    LFT Recent Labs    12/30/19 1925  PROT 5.1*  ALBUMIN 3.0*  AST 18  ALT 18  ALKPHOS 70  BILITOT 0.5   PT/INR Recent Labs    12/30/19 1925  LABPROT 15.0  INR 1.2    STUDIES: DG Chest 2 View  Result Date: 12/30/2019 CLINICAL DATA:  Dizziness EXAM: CHEST - 2 VIEW COMPARISON:  08/22/2019 FINDINGS: Aortic valve stent endograft. Both lungs are clear. Disc degenerative disease and ankylosis of the thoracic spine. IMPRESSION: No acute abnormality of the lungs.  Aortic valve stent endograft. Electronically Signed   By: Eddie Candle M.D.   On: 12/30/2019 20:35   CT RENAL STONE STUDY  Result Date: 12/31/2019 CLINICAL DATA:  Flank pain. Dizziness and weakness. Where a electronic medical record abscess history of renal cell carcinoma and pancreatic cancer. Post Whipple procedure and left nephrectomy. EXAM: CT ABDOMEN AND PELVIS WITHOUT CONTRAST TECHNIQUE: Multidetector CT imaging of the abdomen and pelvis was performed following the standard protocol without IV contrast. COMPARISON:  Most recent CT 08/05/2019. Contrast-enhanced exam 12/25/2017 FINDINGS: Lower chest: Heart is normal in size. Coronary artery calcifications. Emphysema. Stable peribronchovascular nodularity in the left lower  lobe consistent with post infectious or inflammatory scarring. Hepatobiliary: Pneumobilia in the left lobe, likely postprocedural. No evidence of focal hepatic lesion on noncontrast exam. Clips in the gallbladder fossa postcholecystectomy. No biliary dilatation. Pancreas: Not well-defined on the current exam given lack of contrast and paucity of intra-abdominal  fat. There are surgical clips in the expected location of the pancreatic head. Spleen: Normal in size. No evidence of focal abnormality. Adrenals/Urinary Tract: Post left nephrectomy. Left adrenal gland is not visualized. Normal right adrenal gland. Nonobstructing stone in the lower right kidney. Small low-attenuation lesion in the posterior right kidney is too small to characterize but likely small cyst. No right hydronephrosis. Urinary bladder distended. No bladder wall thickening. Possible punctate stone in the dependent bladder, series 2, image 70. Stomach/Bowel: Bowel evaluation is limited in the absence of contrast and paucity of intra-abdominal fat. Postsurgical change involving the stomach and small bowel. No small bowel obstruction or evidence of inflammation. Fecalization of distal small bowel contents. Appendix not visualized, no evidence of appendicitis. Small to moderate volume of stool in the colon. Descending and sigmoid diverticulosis without diverticulitis. No colonic inflammation. Vascular/Lymphatic: Advanced aorta bi-iliac atherosclerosis. Fusiform ectasia of the infrarenal aorta maximal dimension 3.0 cm aneurysmal dilatation of the right common iliac artery measuring 2.2 cm proximally and 3.4 cm distally, unchanged. No adjacent stranding. Aneurysm at the internal external bifurcation at 2.4 cm. There is branch vessel atherosclerosis. No definite enlarged abdominopelvic lymph nodes allowing for limited limitations related to lack of contrast and paucity of body fat. Reproductive: Prominent prostate gland of 4.9 cm. Other: Stable focal rounded haziness in the right omentum, series 2, image 33. tiny adjacent nodule measuring 5 mm, series 2, image 32. No ascites. No free air. Musculoskeletal: Chronic L2 compression fracture. Bones are diffusely under mineralized. Multilevel degenerative change in the spine. No evidence of focal bone lesion. IMPRESSION: 1. Distended urinary bladder with possible  punctate stone dependently. Nonobstructing stone in the right kidney. No hydronephrosis or obstructive uropathy. 2. Colonic diverticulosis without diverticulitis. 3. Stable focal rounded haziness in the right omentum since 08/05/2019 CT. There is a tiny 5 mm adjacent soft tissue nodule, that is nonspecific, better seen on the current exam. Recommend attention on follow-up. 4. Post left nephrectomy. Post reported Whipple procedure. Pneumobilia is likely postprocedural. 5. Aortic and right iliac aneurysms, not significantly changed from prior exam. Aortic aneurysm up to 3 cm, with right iliac aneurysm up to 3.4 cm. Recommend followup by ultrasound in 3 years. This recommendation follows ACR consensus guidelines: White Paper of the ACR Incidental Findings Committee II on Vascular Findings. J Am Coll Radiol 2013; 10:789-794. Aortic aneurysm NOS (ICD10-I71.9) Aortic Atherosclerosis (ICD10-I70.0) Electronically Signed   By: Keith Rake M.D.   On: 12/31/2019 00:12     Impression / Plan:   Impression: 1.  Acute GI bleed: Likely upper given melena consider from aspirin added in December, 5.1--> status post 2 units PRBCs --> 7.0, and Protonix infusion, patient stable; consider PUD versus other 2.  Acute blood loss anemia: From above 3.  History of CAD status post stenting last in 08/2019: holding Plavix and aspirin for now 4.  History of pancreatic cancer status post Whipple 5.  History of renal cell cancer status post nephrectomy 6.  Status post TAVR 7.  Acute on chronic kidney disease stage III  Plan: 1.  Discussed with the patient and his wife we will need to do an EGD.  Likely we will give the patient a chance for  his blood counts to rise and plan on doing this tomorrow.  Unless patient starts to have more overt bleeding, then will need to proceed today.  Did discuss risk on benefits, imitations and alternatives and patient agrees to proceed. 2.  Ordered another unit PRBCs today. 3.  We will keep the  patient n.p.o. for now, if we do not do procedures by 5:00 today and patient can be on a clear liquid diet and n.p.o. after midnight. 4.  Continue Protonix 72m IV BID 5. Please alert our service if patient starts actively bleeding. 6.  Hgb ordered q6h; continue to monitor with transfusion <7 7.  Please await any final recommendations from Dr. GLyndel Safelater today.  Thank you for your kind consultation, we will continue to follow.  JLavone NianLAnne Arundel Digestive Center 12/31/2019, 8:41 AM  Addendum: Endoscopy schedule is full tomorrow. Currently patient is scheduled for 1015 Thursday 01/02/20. Will allow patient regular diet and NPO after midnight 01/02/20- PLEASE ALERT OUR SERVICE IF CHANGE IN ACUTE BLEEDING.  JEllouise Newer PA-C     Attending physician's note   I have taken an interval history, reviewed the chart and examined the patient. I agree with the Advanced Practitioner's note, impression and recommendations.   UGI bleed. HD stable. Neg NCCT. Stable AAA CAD s/p DES 08/2019 on plavix/ASA (added 08/2020) H/O pancreatic CA s/p Whipple's 2019 Acute on CKD (s/p L nephrectomy d/t RCC)  Plan: -IV Protonix. -Trend CBC. Keep Hb>7 -EGD.  Due to scheduling constraints, has to be on 4/22, unless active bleeding -Agree to hold aspirin/Plavix for now (last dose 4/19) -D/W pt.  RCarmell Austria MD LVelora HecklerGFabienne [id number] [phone]

## 2019-12-31 NOTE — Telephone Encounter (Signed)
Scheduled per los. Called and left msg. Mailed printout  °

## 2019-12-31 NOTE — Plan of Care (Signed)

## 2019-12-31 NOTE — ED Notes (Signed)
Notified patients wife of bed staff

## 2019-12-31 NOTE — Progress Notes (Signed)
PROGRESS NOTE    Manuel Olson  UTM:[id number] DOB: 05-08-1946 DOA: 12/30/2019 PCP: [name], MD   Brief Narrative:74 y.o. male with history of CAD status post stenting, status post TAVR, hypertension diabetes mellitus, pancreatic cancer status post Whipple's procedure, nephrectomy for renal cell cancer presents to the ER the patient was feeling weak and dizzy and hypotensive at home.  At the home patient also had a large amount of black tarry stool.  Denies taking any NSAIDs.  Patient is on aspirin and Plavix CAD.  Over the last 1 week patient has been having some abdominal discomfort mostly epigastric area denies any vomiting.  ED Course: In the ER blood work show hemoglobin of 5.2 which dropped from 9.1 about 4 days ago.  Stool for occult blood was positive and melanotic.  ER physician discussed with on-call of our gastroenterologist who advised transfusion and placing patient on Protonix and will be seeing patient in consult for EGD.  On exam patient abdomen appears benign.  EKG shows normal sinus rhythm.  Labs show acute on chronic kidney disease stage III with creatinine around 1.8 which is worsened from 1.5 few months ago.  High-sensitivity troponin was negative Covid test was negative.  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  Holding aspirin and Plavix at this time.  He received 2 units of blood transfusion with his hemoglobin up to 7 from 5.1.  Extra unit of PRBC has been ordered by GI today.  Continue Protonix IV twice daily.  Follow-up H&H every 6.  Possible EGD in a.m.  N.p.o. after midnight.  #2 history of CAD/stent December 2020 on  Plavix at home which is currently on hold due to upper GI bleed  #3 history of pancreatic cancer status post Whipple's procedure  #4 status post nephrectomy for renal cell carcinoma  #5 status post TAVR  #6 AKI on CKD stage III  #7 type 2 diabetes on SSI continue the same for now. CBG (last 3)  Recent Labs    12/31/19 0019 12/31/19 0336 12/31/19 0801  GLUCAP 203* 193* 169*     Estimated body mass index is 19.44 kg/m as calculated from the following:   Height as of this encounter: 5' 10"  (1.778 m).   Weight as of this encounter: 61.5 kg.  DVT prophylaxis: SCD  code Status: Full code  family Communication: Discussed with patient disposition Plan: Status is: Inpatient  Remains inpatient appropriate because:active gi bleed   Dispo: The patient is from:home              Anticipated d/c is to: home              Anticipated d/c date is: 4/22              Patient currently not stable for DC due to active gi bleed  Consultants:   gi  Procedures:none Antimicrobials:none  Subjective:  awake alert feeling better  No BM since admit  Objective: Vitals:   12/31/19 0900 12/31/19 0915 12/31/19 0930 12/31/19 1100  BP: 139/65  135/66 124/62  Pulse: 73 84 76 79  Resp:  18  Temp:      TempSrc:      SpO2: 99% 100% 100% 100%  Weight:      Height:        Intake/Output Summary (Last 24 hours) at 12/31/2019 1112 Last data filed at 12/31/2019 0618 Gross per 24 hour  Intake 1416.84 ml  Output --  Net 1416.84 ml   Filed Weights   12/30/19 1820  Weight: 61.5 kg    Examination: Pale conjuctiva General exam: Appears calm and comfortable  Respiratory system: Clear to auscultation. Respiratory effort normal. Cardiovascular system: S1 & S2 heard, RRR. No JVD, murmurs, rubs, gallops or clicks. No pedal edema. Gastrointestinal system: Abdomen is nondistended, soft and nontender. No organomegaly or masses felt. Normal bowel sounds heard. Central nervous system: Alert and  oriented. No focal neurological deficits. Extremities: Symmetric 5 x 5 power. Skin: No rashes, lesions or ulcers Psychiatry: Judgement and insight appear normal. Mood & affect appropriate.     Data Reviewed: I have personally reviewed following labs and imaging studies  CBC: Recent Labs  Lab 12/26/19 0846 12/30/19 1925 12/30/19 1937 12/31/19 0814  WBC 8.0 12.2*  --  6.1  NEUTROABS 5.7 9.7*  --   --   HGB 9.1* 5.2* 5.1* 7.0*  HCT 28.6* 17.4* 15.0* 21.8*  MCV 95.3 100.6*  --  93.2  PLT 169 180  --  161*   Basic Metabolic Panel: Recent Labs  Lab 12/30/19 1925 12/30/19 1937 12/31/19 0814  NA 130* 131* 136  K 5.4* 5.3* 4.3  CL 101 101 110  CO2 19*  --  19*  GLUCOSE 212* 205* 175*  BUN 56* 54* 55*  CREATININE 1.87* 1.80* 1.59*  CALCIUM 8.1*  --  7.2*   GFR: Estimated Creatinine Clearance: 34.4 mL/min (A) (by C-G formula based on SCr of 1.59 mg/dL (H)). Liver Function Tests: Recent Labs  Lab 12/30/19 1925  AST 18  ALT 18  ALKPHOS 70  BILITOT 0.5  PROT 5.1*  ALBUMIN 3.0*   No results for input(s): LIPASE, AMYLASE in the last 168 hours. No results for input(s): AMMONIA in the last 168 hours. Coagulation Profile: Recent Labs  Lab 12/30/19 1925  INR 1.2   Cardiac Enzymes: No results for input(s): CKTOTAL, CKMB, CKMBINDEX, TROPONINI in the last 168 hours. BNP (last 3 results) No results for input(s): PROBNP in the last 8760 hours. HbA1C: No results for input(s): HGBA1C in the last 72 hours. CBG: Recent Labs  Lab 12/30/19 1844 12/31/19 0019 12/31/19 0336 12/31/19 0801  GLUCAP 191* 203* 193* 169*   Lipid Profile: No results for input(s): CHOL, HDL, LDLCALC, TRIG, CHOLHDL, LDLDIRECT in the last 72 hours. Thyroid Function Tests: No results for input(s): TSH, T4TOTAL, FREET4, T3FREE, THYROIDAB in the last 72 hours. Anemia Panel: Recent Labs    12/30/19 2008  VITAMINB12 174*  FOLATE 18.6  FERRITIN 8*  TIBC 379  IRON 12*  RETICCTPCT 3.2*   Sepsis  Labs: No results for input(s): PROCALCITON, LATICACIDVEN in the last 168 hours.  Recent Results (from the past 240 hour(s))  Respiratory Panel by RT PCR (Flu A&B, Covid) - Nasopharyngeal Swab     Status: None   Collection Time: 12/30/19  9:42 PM   Specimen: Nasopharyngeal Swab  Result Value Ref Range Status   SARS Coronavirus 2 by RT PCR NEGATIVE NEGATIVE Final    Comment: (NOTE) SARS-CoV-2 target nucleic acids are NOT DETECTED. The SARS-CoV-2 RNA is generally detectable in upper respiratoy specimens during the acute phase of  infection. The lowest concentration of SARS-CoV-2 viral copies this assay can detect is 131 copies/mL. A negative result does not preclude SARS-Cov-2 infection and should not be used as the sole basis for treatment or other patient management decisions. A negative result may occur with  improper specimen collection/handling, submission of specimen other than nasopharyngeal swab, presence of viral mutation(s) within the areas targeted by this assay, and inadequate number of viral copies (<131 copies/mL). A negative result must be combined with clinical observations, patient history, and epidemiological information. The expected result is Negative. Fact Sheet for Patients:  PinkCheek.be Fact Sheet for Healthcare Providers:  GravelBags.it This test is not yet ap proved or cleared by the [place] FDA and  has been authorized for detection and/or diagnosis of SARS-CoV-2 by FDA under an Emergency Use Authorization (EUA). This EUA will remain  in effect (meaning this test can be used) for the duration of the COVID-19 declaration under Section 564(b)(1) of the Act, 21 U.S.C. section 360bbb-3(b)(1), unless the authorization is terminated or revoked sooner.    Influenza A by PCR NEGATIVE NEGATIVE Final   Influenza B by PCR NEGATIVE NEGATIVE Final    Comment: (NOTE) The Xpert Xpress SARS-CoV-2/FLU/RSV assay  is intended as an aid in  the diagnosis of influenza from Nasopharyngeal swab specimens and  should not be used as a sole basis for treatment. Nasal washings and  aspirates are unacceptable for Xpert Xpress SARS-CoV-2/FLU/RSV  testing. Fact Sheet for Patients: PinkCheek.be Fact Sheet for Healthcare Providers: GravelBags.it This test is not yet approved or cleared by the [place] FDA and  has been authorized for detection and/or diagnosis of SARS-CoV-2 by  FDA under an Emergency Use Authorization (EUA). This EUA will remain  in effect (meaning this test can be used) for the duration of the  Covid-19 declaration under Section 564(b)(1) of the Act, 21  U.S.C. section 360bbb-3(b)(1), unless the authorization is  terminated or revoked. Performed at [facility], [place] [address], [place], [place] [address]          Radiology Studies: DG Chest 2 View  Result Date: 12/30/2019 CLINICAL DATA:  Dizziness EXAM: CHEST - 2 VIEW COMPARISON:  08/22/2019 FINDINGS: Aortic valve stent endograft. Both lungs are clear. Disc degenerative disease and ankylosis of the thoracic spine. IMPRESSION: No acute abnormality of the lungs.  Aortic valve stent endograft. Electronically Signed   By: Eddie Candle M.D.   On: 12/30/2019 20:35   CT RENAL STONE STUDY  Result Date: 12/31/2019 CLINICAL DATA:  Flank pain. Dizziness and weakness. Where a electronic medical record abscess history of renal cell carcinoma and pancreatic cancer. Post Whipple procedure and left nephrectomy. EXAM: CT ABDOMEN AND PELVIS WITHOUT CONTRAST TECHNIQUE: Multidetector CT imaging of the abdomen and pelvis was performed following the standard protocol without IV contrast. COMPARISON:  Most recent CT 08/05/2019. Contrast-enhanced exam 12/25/2017 FINDINGS: Lower chest: Heart is normal in size. Coronary artery calcifications. Emphysema. Stable peribronchovascular  nodularity in the left lower lobe consistent with post infectious or inflammatory scarring. Hepatobiliary: Pneumobilia in the left lobe, likely postprocedural. No evidence of focal hepatic lesion on noncontrast exam. Clips in the gallbladder fossa postcholecystectomy. No biliary dilatation. Pancreas: Not well-defined on the current exam given lack of contrast and paucity of intra-abdominal fat. There are surgical clips in the expected location of the pancreatic head. Spleen: Normal in size. No evidence of focal abnormality. Adrenals/Urinary Tract: Post left nephrectomy. Left adrenal gland is not visualized. Normal right adrenal gland. Nonobstructing stone  in the lower right kidney. Small low-attenuation lesion in the posterior right kidney is too small to characterize but likely small cyst. No right hydronephrosis. Urinary bladder distended. No bladder wall thickening. Possible punctate stone in the dependent bladder, series 2, image 70. Stomach/Bowel: Bowel evaluation is limited in the absence of contrast and paucity of intra-abdominal fat. Postsurgical change involving the stomach and small bowel. No small bowel obstruction or evidence of inflammation. Fecalization of distal small bowel contents. Appendix not visualized, no evidence of appendicitis. Small to moderate volume of stool in the colon. Descending and sigmoid diverticulosis without diverticulitis. No colonic inflammation. Vascular/Lymphatic: Advanced aorta bi-iliac atherosclerosis. Fusiform ectasia of the infrarenal aorta maximal dimension 3.0 cm aneurysmal dilatation of the right common iliac artery measuring 2.2 cm proximally and 3.4 cm distally, unchanged. No adjacent stranding. Aneurysm at the internal external bifurcation at 2.4 cm. There is branch vessel atherosclerosis. No definite enlarged abdominopelvic lymph nodes allowing for limited limitations related to lack of contrast and paucity of body fat. Reproductive: Prominent prostate gland of 4.9  cm. Other: Stable focal rounded haziness in the right omentum, series 2, image 33. tiny adjacent nodule measuring 5 mm, series 2, image 32. No ascites. No free air. Musculoskeletal: Chronic L2 compression fracture. Bones are diffusely under mineralized. Multilevel degenerative change in the spine. No evidence of focal bone lesion. IMPRESSION: 1. Distended urinary bladder with possible punctate stone dependently. Nonobstructing stone in the right kidney. No hydronephrosis or obstructive uropathy. 2. Colonic diverticulosis without diverticulitis. 3. Stable focal rounded haziness in the right omentum since 08/05/2019 CT. There is a tiny 5 mm adjacent soft tissue nodule, that is nonspecific, better seen on the current exam. Recommend attention on follow-up. 4. Post left nephrectomy. Post reported Whipple procedure. Pneumobilia is likely postprocedural. 5. Aortic and right iliac aneurysms, not significantly changed from prior exam. Aortic aneurysm up to 3 cm, with right iliac aneurysm up to 3.4 cm. Recommend followup by ultrasound in 3 years. This recommendation follows ACR consensus guidelines: White Paper of the ACR Incidental Findings Committee II on Vascular Findings. J Am Coll Radiol 2013; 10:789-794. Aortic aneurysm NOS (ICD10-I71.9) Aortic Atherosclerosis (ICD10-I70.0) Electronically Signed   By: Keith Rake M.D.   On: 12/31/2019 00:12        Scheduled Meds: . sodium chloride   Intravenous Once  . insulin aspart  0-9 Units Subcutaneous Q4H  . [START ON 01/03/2020] pantoprazole  40 mg Intravenous Q12H   Continuous Infusions: . sodium chloride Stopped (12/30/19 2048)  . pantoprozole (PROTONIX) infusion 8 mg/hr (12/31/19 0543)     LOS: 0 days      Georgette Shell, MD 12/31/2019, 11:12 AM

## 2020-01-01 DIAGNOSIS — K921 Melena: Secondary | ICD-10-CM

## 2020-01-01 DIAGNOSIS — D62 Acute posthemorrhagic anemia: Secondary | ICD-10-CM

## 2020-01-01 DIAGNOSIS — Z7902 Long term (current) use of antithrombotics/antiplatelets: Secondary | ICD-10-CM

## 2020-01-01 LAB — GLUCOSE, CAPILLARY
Glucose-Capillary: 104 mg/dL — ABNORMAL HIGH (ref 70–99)
Glucose-Capillary: 125 mg/dL — ABNORMAL HIGH (ref 70–99)
Glucose-Capillary: 151 mg/dL — ABNORMAL HIGH (ref 70–99)
Glucose-Capillary: 163 mg/dL — ABNORMAL HIGH (ref 70–99)
Glucose-Capillary: 182 mg/dL — ABNORMAL HIGH (ref 70–99)
Glucose-Capillary: 189 mg/dL — ABNORMAL HIGH (ref 70–99)

## 2020-01-01 LAB — COMPREHENSIVE METABOLIC PANEL
ALT: 19 U/L (ref 0–44)
AST: 23 U/L (ref 15–41)
Albumin: 2.8 g/dL — ABNORMAL LOW (ref 3.5–5.0)
Alkaline Phosphatase: 58 U/L (ref 38–126)
Anion gap: 8 (ref 5–15)
BUN: 54 mg/dL — ABNORMAL HIGH (ref 8–23)
CO2: 21 mmol/L — ABNORMAL LOW (ref 22–32)
Calcium: 7.9 mg/dL — ABNORMAL LOW (ref 8.9–10.3)
Chloride: 107 mmol/L (ref 98–111)
Creatinine, Ser: 1.85 mg/dL — ABNORMAL HIGH (ref 0.61–1.24)
GFR calc Af Amer: 40 mL/min — ABNORMAL LOW (ref >60)
GFR calc non Af Amer: 35 mL/min — ABNORMAL LOW (ref >60)
Glucose, Bld: 146 mg/dL — ABNORMAL HIGH (ref 70–99)
Potassium: 4.4 mmol/L (ref 3.5–5.1)
Sodium: 136 mmol/L (ref 135–145)
Total Bilirubin: 1.4 mg/dL — ABNORMAL HIGH (ref 0.3–1.2)
Total Protein: 4.7 g/dL — ABNORMAL LOW (ref 6.5–8.1)

## 2020-01-01 LAB — HEMOGLOBIN AND HEMATOCRIT, BLOOD
HCT: 28.7 % — ABNORMAL LOW (ref 39.0–52.0)
Hemoglobin: 9.6 g/dL — ABNORMAL LOW (ref 13.0–17.0)

## 2020-01-01 LAB — HEMOGLOBIN
Hemoglobin: 7 g/dL — ABNORMAL LOW (ref 13.0–17.0)
Hemoglobin: 6.7 g/dL — CL (ref 13.0–17.0)
Hemoglobin: 8.2 g/dL — ABNORMAL LOW (ref 13.0–17.0)

## 2020-01-01 LAB — PREPARE RBC (CROSSMATCH)

## 2020-01-01 LAB — MAGNESIUM: Magnesium: 2 mg/dL (ref 1.7–2.4)

## 2020-01-01 MED ORDER — SODIUM CHLORIDE 0.9% IV SOLUTION: Freq: Once | INTRAVENOUS | Status: AC

## 2020-01-01 MED ORDER — TRAMADOL HCL 50 MG PO TABS: 50.0000 mg | ORAL_TABLET | Freq: Four times a day (QID) | ORAL | Status: DC | PRN

## 2020-01-01 NOTE — Progress Notes (Signed)
CRITICAL VALUE ALERT  Critical Value:  Hgb 6.7  Date & Time Notied:  01/01/20 at 0633  Provider Notified: Sharlet Salina, NP  Orders Received/Actions taken:

## 2020-01-01 NOTE — H&P (View-Only) (Signed)
[facility] Progress Note  CC:  UGIB  Subjective:  Feels good.  No further sign of bleeding, no BM at all.  Hgb 6.7 grams this AM so received another unit PRBC's.  Review of systems: Denies chest pain dyspnea or dysuria Remainder systems negative except as above  Objective:  Vital signs in last 24 hours: Temp:  [97.7 F (36.5 C)-98.7 F (37.1 C)] 97.7 F (36.5 C) (04/21 0744) Pulse Rate:  [69-96] 73 (04/21 0744) Resp:  [12-19] 16 (04/21 0744) BP: (109-149)/(54-96) 124/66 (04/21 0744) SpO2:  [97 %-100 %] 99 % (04/21 0744) Weight:  [63.8 kg] 63.8 kg (04/21 0428) Last BM Date: 12/30/19 General:  Alert, Well-developed, in NAD Heart:  Regular rate and rhythm; no murmurs Pulm:  CTAB.  No increased WOB. Abdomen:  Soft, non-distended.  BS present.  Non-tender. Extremities:  Without edema. Neurologic:  Alert and oriented x 4;  grossly normal neurologically. Psych:  Alert and cooperative. Normal mood and affect.  Intake/Output from previous day: 04/20 0701 - 04/21 0700 In: 650 [P.O.:255; I.V.:80; Blood:315] Out: -   Lab Results: Recent Labs    12/30/19 1925 12/30/19 1925 12/30/19 1937 12/30/19 1937 12/31/19 0814 12/31/19 0814 12/31/19 1630 01/01/20 0034 01/01/20 0605  WBC 12.2*  --   --   --  6.1  --   --   --   --   HGB 5.2*   < > 5.1*   < > 7.0*   < > 7.2* 7.0* 6.7*  HCT 17.4*  --  15.0*  --  21.8*  --   --   --   --   PLT 180  --   --   --  113*  --   --   --   --    < > = values in this interval not displayed.   BMET Recent Labs    12/30/19 1925 12/30/19 1937 12/31/19 0814  NA 130* 131* 136  K 5.4* 5.3* 4.3  CL 101 101 110  CO2 19*  --  19*  GLUCOSE 212* 205* 175*  BUN 56* 54* 55*  CREATININE 1.87* 1.80* 1.59*  CALCIUM 8.1*  --  7.2*   LFT Recent Labs    12/30/19 1925  PROT 5.1*  ALBUMIN 3.0*  AST 18  ALT 18  ALKPHOS 70  BILITOT 0.5   PT/INR Recent Labs    12/30/19 1925  LABPROT 15.0  INR 1.2   DG Chest 2  View  Result Date: 12/30/2019 CLINICAL DATA:  Dizziness EXAM: CHEST - 2 VIEW COMPARISON:  08/22/2019 FINDINGS: Aortic valve stent endograft. Both lungs are clear. Disc degenerative disease and ankylosis of the thoracic spine. IMPRESSION: No acute abnormality of the lungs.  Aortic valve stent endograft. Electronically Signed   By: Eddie Candle M.D.   On: 12/30/2019 20:35   CT RENAL STONE STUDY  Result Date: 12/31/2019 CLINICAL DATA:  Flank pain. Dizziness and weakness. Where a electronic medical record abscess history of renal cell carcinoma and pancreatic cancer. Post Whipple procedure and left nephrectomy. EXAM: CT ABDOMEN AND PELVIS WITHOUT CONTRAST TECHNIQUE: Multidetector CT imaging of the abdomen and pelvis was performed following the standard protocol without IV contrast. COMPARISON:  Most recent CT 08/05/2019. Contrast-enhanced exam 12/25/2017 FINDINGS: Lower chest: Heart is normal in size. Coronary artery calcifications. Emphysema. Stable peribronchovascular nodularity in the left lower lobe consistent with post infectious or inflammatory scarring. Hepatobiliary: Pneumobilia in the left lobe, likely postprocedural. No evidence of focal hepatic lesion  on noncontrast exam. Clips in the gallbladder fossa postcholecystectomy. No biliary dilatation. Pancreas: Not well-defined on the current exam given lack of contrast and paucity of intra-abdominal fat. There are surgical clips in the expected location of the pancreatic head. Spleen: Normal in size. No evidence of focal abnormality. Adrenals/Urinary Tract: Post left nephrectomy. Left adrenal gland is not visualized. Normal right adrenal gland. Nonobstructing stone in the lower right kidney. Small low-attenuation lesion in the posterior right kidney is too small to characterize but likely small cyst. No right hydronephrosis. Urinary bladder distended. No bladder wall thickening. Possible punctate stone in the dependent bladder, series 2, image 70.  Stomach/Bowel: Bowel evaluation is limited in the absence of contrast and paucity of intra-abdominal fat. Postsurgical change involving the stomach and small bowel. No small bowel obstruction or evidence of inflammation. Fecalization of distal small bowel contents. Appendix not visualized, no evidence of appendicitis. Small to moderate volume of stool in the colon. Descending and sigmoid diverticulosis without diverticulitis. No colonic inflammation. Vascular/Lymphatic: Advanced aorta bi-iliac atherosclerosis. Fusiform ectasia of the infrarenal aorta maximal dimension 3.0 cm aneurysmal dilatation of the right common iliac artery measuring 2.2 cm proximally and 3.4 cm distally, unchanged. No adjacent stranding. Aneurysm at the internal external bifurcation at 2.4 cm. There is branch vessel atherosclerosis. No definite enlarged abdominopelvic lymph nodes allowing for limited limitations related to lack of contrast and paucity of body fat. Reproductive: Prominent prostate gland of 4.9 cm. Other: Stable focal rounded haziness in the right omentum, series 2, image 33. tiny adjacent nodule measuring 5 mm, series 2, image 32. No ascites. No free air. Musculoskeletal: Chronic L2 compression fracture. Bones are diffusely under mineralized. Multilevel degenerative change in the spine. No evidence of focal bone lesion. IMPRESSION: 1. Distended urinary bladder with possible punctate stone dependently. Nonobstructing stone in the right kidney. No hydronephrosis or obstructive uropathy. 2. Colonic diverticulosis without diverticulitis. 3. Stable focal rounded haziness in the right omentum since 08/05/2019 CT. There is a tiny 5 mm adjacent soft tissue nodule, that is nonspecific, better seen on the current exam. Recommend attention on follow-up. 4. Post left nephrectomy. Post reported Whipple procedure. Pneumobilia is likely postprocedural. 5. Aortic and right iliac aneurysms, not significantly changed from prior exam. Aortic  aneurysm up to 3 cm, with right iliac aneurysm up to 3.4 cm. Recommend followup by ultrasound in 3 years. This recommendation follows ACR consensus guidelines: White Paper of the ACR Incidental Findings Committee II on Vascular Findings. J Am Coll Radiol 2013; 10:789-794. Aortic aneurysm NOS (ICD10-I71.9) Aortic Atherosclerosis (ICD10-I70.0) Electronically Signed   By: Keith Rake M.D.   On: 12/31/2019 00:12   Assessment / Plan: 1.  Acute GI bleed: Likely upper given melena consider from aspirin added in December, 5.1gram Hgb--> status post 2 units PRBCs --> 7.0 grams, and Protonix infusion, patient stable; consider PUD versus other.  Hgb this AM down to 6.7 grams again so given one more unit PRBC's. 2.  Acute blood loss anemia: From above, see above. 3.  History of CAD status post stenting last in 08/2019: holding Plavix and aspirin for now, last dose 4/19. 4.  History of pancreatic cancer status post Whipple 5.  History of renal cell cancer status post nephrectomy 6.  Status post TAVR 7.  Acute on chronic kidney disease stage III  -EGD on 4/22 with Dr. [name]. [facility] for clear liquids today then NPO after midnight. -Monitor Hgb and transfuse further prn.  Please notify our team for any sign of active bleeding. -  Ok to continue PPI gtt for now given the fact that Plavix still in system and has not yet been evaluated endoscopically.   LOS: 1 day   Laban Emperor. Zehr  01/01/2020, 9:14 AM   I have discussed the case with the PA, and that is the plan I formulated. I personally interviewed and examined the patient.  His wife was with him for the entire visit.  He reports feeling considerably better today.  He only had 1 small black stool earlier today.  Hemoglobin down, but overall scenario shows stability. He looks fairly well today.  He is in good spirits and in no distress.  Hungry and thirsty but bearing it well. Bleeding has most likely stopped at this point. Multiple chronic medical  conditions. Aspirin and Plavix for coronary disease with prior stent.  Suspected upper GI bleed, probable peptic ulcer either from aspirin or perhaps Whipple anastomosis.  He is on a Protonix drip, currently getting PRBC transfusion.  Exam benign, vital stable. Pulse in the 70s on exam.  Plan for upper endoscopy tomorrow with Dr. [name].  He was agreeable after discussion of procedure and risks.  The benefits and risks of the planned procedure were described in detail with the patient or (when appropriate) their health care proxy.  Risks were outlined as including, but not limited to, bleeding, infection, perforation, adverse medication reaction leading to cardiac or pulmonary decompensation, pancreatitis (if ERCP).  The limitation of incomplete mucosal visualization was also discussed.  No guarantees or warranties were given.  Patient at increased risk for cardiopulmonary complications of procedure due to medical comorbidities.    Nelida Meuse III Office: [phone]

## 2020-01-01 NOTE — Progress Notes (Addendum)
Tele called and stated that patient had a 3 beat run of v. Tach. Patient is asymptomatic. AMION page sent to Dr. Rodena Piety.  Dr. Rodena Piety is ordering a CMET and magnesium.

## 2020-01-01 NOTE — Progress Notes (Signed)
PROGRESS NOTE    Manuel Olson  KNL:[id number] DOB: May 17, 1946 DOA: 12/30/2019 PCP: Manuel Barrack, MD   Brief Narrative: 74 y.o.malewithhistory of CAD status post stenting, status post TAVR, hypertension diabetes mellitus, pancreatic cancer status post Whipple's procedure, nephrectomy for renal cell cancer presents to the ER the patient was feeling weak and dizzy and hypotensive at home. At the home patient also had a large amount of black tarry stool. Denies taking any NSAIDs. Patient is on aspirin and Plavix CAD. Over the last 1 week patient has been having some abdominal discomfort mostly epigastric area denies any vomiting.  ED Course:In the ER blood work show hemoglobin of 5.2 which dropped from 9.1 about 4 days ago. Stool for occult blood was positive and melanotic. ER physician discussed with on-call of our gastroenterologist who advised transfusion and placing patient on Protonix and will be seeing patient in consult for EGD. On exam patient abdomen appears benign. EKG shows normal sinus rhythm. Labs show acute on chronic kidney disease stage III with creatinine around 1.8 which is worsened from 1.5 few months ago. High-sensitivity troponin was negative Covid test was negative.  Assessment & Plan:   Principal Problem:   Acute GI bleeding Active Problems:   Cancer of head of pancreas (Manuel Olson) s/p Whipple 02/2018   Severe AS S/P TAVR    Poorly controlled type 2 diabetes mellitus with circulatory disorder (Manuel Olson)   Renal cell carcinoma of right kidney (Manuel Olson) s/p nephrectomy 02/2018   GI bleed   #1  Upper gi bleed likely upper-patient is on aspirin and Plavix at home.  Holding aspirin and Plavix at this time.  Hemoglobin 6.7 today getting blood transfusion.  He has not had any further hematochezia.  In fact he feels stronger and better and anxious to go home.  EGD per GI.  #2 history of CAD/stent December 2020 on Plavix at home which is currently on hold due to upper GI  bleed  #3 history of pancreatic cancer status post Whipple's procedure  #4 status post nephrectomy for renal cell carcinoma  #5 status post TAVR  #6 AKI on CKD stage III stable  #7 type 2 diabetes on SSI continue the same for now.    Estimated body mass index is 20.18 kg/m as calculated from the following:   Height as of this encounter: 5' 10"  (1.778 m).   Weight as of this encounter: 63.8 kg.  DVT prophylaxis: SCD Code Status full code  family Communication: Discussed with patient's wife disposition Plan: Status is: Inpatient Dispo: The patient is from: Home              Anticipated d/c is to home               Anticipated d/c date is: 1 to 2 days              Patient currently is not medically stable to d/c.  Patient admitted with upper GI bleed getting blood transfusion plan for scoping tomorrow.       Consultants:   GI  Procedures: None Antimicrobials: None Subjective: Patient resting in bed awake alert anxious to go home wife by the bedside no further vomiting nausea or hematemesis.  Objective: Vitals:   01/01/20 0936 01/01/20 1258 01/01/20 1330 01/01/20 1358  BP: 120/85 (!) 131/53 (!) 130/58 (!) 129/56  Pulse: 67 72 69 70  Resp: 16 20 16 20   Temp: 98.2 F (36.8 C) 98.2 F (36.8 C) 98.4 F (36.9 C) 98.8  F (37.1 C)  TempSrc: Oral Oral Oral Oral  SpO2: 100% 100% 100% 100%  Weight:      Height:        Intake/Output Summary (Last 24 hours) at 01/01/2020 1459 Last data filed at 01/01/2020 0936 Gross per 24 hour  Intake 965 ml  Output --  Net 965 ml   Filed Weights   12/30/19 1820 01/01/20 0428  Weight: 61.5 kg 63.8 kg    Examination:  General exam: Appears calm and comfortable  Respiratory system: Clear to auscultation. Respiratory effort normal. Cardiovascular system: S1 & S2 heard, RRR. No JVD, murmurs, rubs, gallops or clicks. No pedal edema. Gastrointestinal system: Abdomen is nondistended, soft and nontender. No organomegaly or  masses felt. Normal bowel sounds heard. Central nervous system: Alert and oriented. No focal neurological deficits. Extremities: Symmetric 5 x 5 power. Skin: No rashes, lesions or ulcers Psychiatry: Judgement and insight appear normal. Mood & affect appropriate.     Data Reviewed: I have personally reviewed following labs and imaging studies  CBC: Recent Labs  Lab 12/26/19 0846 12/30/19 1925 12/30/19 1925 12/30/19 1937 12/30/19 1937 12/31/19 0814 12/31/19 1630 01/01/20 0034 01/01/20 0605 01/01/20 1243  WBC 8.0 12.2*  --   --   --  6.1  --   --   --   --   NEUTROABS 5.7 9.7*  --   --   --   --   --   --   --   --   HGB 9.1* 5.2*   < > 5.1*   < > 7.0* 7.2* 7.0* 6.7* 8.2*  HCT 28.6* 17.4*  --  15.0*  --  21.8*  --   --   --   --   MCV 95.3 100.6*  --   --   --  93.2  --   --   --   --   PLT 169 180  --   --   --  113*  --   --   --   --    < > = values in this interval not displayed.   Basic Metabolic Panel: Recent Labs  Lab 12/30/19 1925 12/30/19 1937 12/31/19 0814  NA 130* 131* 136  K 5.4* 5.3* 4.3  CL 101 101 110  CO2 19*  --  19*  GLUCOSE 212* 205* 175*  BUN 56* 54* 55*  CREATININE 1.87* 1.80* 1.59*  CALCIUM 8.1*  --  7.2*   GFR: Estimated Creatinine Clearance: 35.7 mL/min (A) (by C-G formula based on SCr of 1.59 mg/dL (H)). Liver Function Tests: Recent Labs  Lab 12/30/19 1925  AST 18  ALT 18  ALKPHOS 70  BILITOT 0.5  PROT 5.1*  ALBUMIN 3.0*   No results for input(s): LIPASE, AMYLASE in the last 168 hours. No results for input(s): AMMONIA in the last 168 hours. Coagulation Profile: Recent Labs  Lab 12/30/19 1925  INR 1.2   Cardiac Enzymes: No results for input(s): CKTOTAL, CKMB, CKMBINDEX, TROPONINI in the last 168 hours. BNP (last 3 results) No results for input(s): PROBNP in the last 8760 hours. HbA1C: No results for input(s): HGBA1C in the last 72 hours. CBG: Recent Labs  Lab 12/31/19 2117 12/31/19 2359 01/01/20 0426 01/01/20 0752  01/01/20 1246  GLUCAP 254* 182* 104* 151* 189*   Lipid Profile: No results for input(s): CHOL, HDL, LDLCALC, TRIG, CHOLHDL, LDLDIRECT in the last 72 hours. Thyroid Function Tests: No results for input(s): TSH, T4TOTAL, FREET4, T3FREE, THYROIDAB in the last  72 hours. Anemia Panel: Recent Labs    12/30/19 2008  VITAMINB12 174*  FOLATE 18.6  FERRITIN 8*  TIBC 379  IRON 12*  RETICCTPCT 3.2*   Sepsis Labs: No results for input(s): PROCALCITON, LATICACIDVEN in the last 168 hours.  Recent Results (from the past 240 hour(s))  Respiratory Panel by RT PCR (Flu A&B, Covid) - Nasopharyngeal Swab     Status: None   Collection Time: 12/30/19  9:42 PM   Specimen: Nasopharyngeal Swab  Result Value Ref Range Status   SARS Coronavirus 2 by RT PCR NEGATIVE NEGATIVE Final    Comment: (NOTE) SARS-CoV-2 target nucleic acids are NOT DETECTED. The SARS-CoV-2 RNA is generally detectable in upper respiratoy specimens during the acute phase of infection. The lowest concentration of SARS-CoV-2 viral copies this assay can detect is 131 copies/mL. A negative result does not preclude SARS-Cov-2 infection and should not be used as the sole basis for treatment or other patient management decisions. A negative result may occur with  improper specimen collection/handling, submission of specimen other than nasopharyngeal swab, presence of viral mutation(s) within the areas targeted by this assay, and inadequate number of viral copies (<131 copies/mL). A negative result must be combined with clinical observations, patient history, and epidemiological information. The expected result is Negative. Fact Sheet for Patients:  PinkCheek.be Fact Sheet for Healthcare Providers:  GravelBags.it This test is not yet ap proved or cleared by the [place] FDA and  has been authorized for detection and/or diagnosis of SARS-CoV-2 by FDA under an Emergency Use  Authorization (EUA). This EUA will remain  in effect (meaning this test can be used) for the duration of the COVID-19 declaration under Section 564(b)(1) of the Act, 21 U.S.C. section 360bbb-3(b)(1), unless the authorization is terminated or revoked sooner.    Influenza A by PCR NEGATIVE NEGATIVE Final   Influenza B by PCR NEGATIVE NEGATIVE Final    Comment: (NOTE) The Xpert Xpress SARS-CoV-2/FLU/RSV assay is intended as an aid in  the diagnosis of influenza from Nasopharyngeal swab specimens and  should not be used as a sole basis for treatment. Nasal washings and  aspirates are unacceptable for Xpert Xpress SARS-CoV-2/FLU/RSV  testing. Fact Sheet for Patients: PinkCheek.be Fact Sheet for Healthcare Providers: GravelBags.it This test is not yet approved or cleared by the [place] FDA and  has been authorized for detection and/or diagnosis of SARS-CoV-2 by  FDA under an Emergency Use Authorization (EUA). This EUA will remain  in effect (meaning this test can be used) for the duration of the  Covid-19 declaration under Section 564(b)(1) of the Act, 21  U.S.C. section 360bbb-3(b)(1), unless the authorization is  terminated or revoked. Performed at [facility], [place] [address] [place], [place] [address]          Radiology Studies: DG Chest 2 View  Result Date: 12/30/2019 CLINICAL DATA:  Dizziness EXAM: CHEST - 2 VIEW COMPARISON:  08/22/2019 FINDINGS: Aortic valve stent endograft. Both lungs are clear. Disc degenerative disease and ankylosis of the thoracic spine. IMPRESSION: No acute abnormality of the lungs.  Aortic valve stent endograft. Electronically Signed   By: Eddie Candle M.D.   On: 12/30/2019 20:35   CT RENAL STONE STUDY  Result Date: 12/31/2019 CLINICAL DATA:  Flank pain. Dizziness and weakness. Where a electronic medical record abscess history of renal cell carcinoma and pancreatic  cancer. Post Whipple procedure and left nephrectomy. EXAM: CT ABDOMEN AND PELVIS WITHOUT CONTRAST TECHNIQUE: Multidetector CT imaging of the abdomen  and pelvis was performed following the standard protocol without IV contrast. COMPARISON:  Most recent CT 08/05/2019. Contrast-enhanced exam 12/25/2017 FINDINGS: Lower chest: Heart is normal in size. Coronary artery calcifications. Emphysema. Stable peribronchovascular nodularity in the left lower lobe consistent with post infectious or inflammatory scarring. Hepatobiliary: Pneumobilia in the left lobe, likely postprocedural. No evidence of focal hepatic lesion on noncontrast exam. Clips in the gallbladder fossa postcholecystectomy. No biliary dilatation. Pancreas: Not well-defined on the current exam given lack of contrast and paucity of intra-abdominal fat. There are surgical clips in the expected location of the pancreatic head. Spleen: Normal in size. No evidence of focal abnormality. Adrenals/Urinary Tract: Post left nephrectomy. Left adrenal gland is not visualized. Normal right adrenal gland. Nonobstructing stone in the lower right kidney. Small low-attenuation lesion in the posterior right kidney is too small to characterize but likely small cyst. No right hydronephrosis. Urinary bladder distended. No bladder wall thickening. Possible punctate stone in the dependent bladder, series 2, image 70. Stomach/Bowel: Bowel evaluation is limited in the absence of contrast and paucity of intra-abdominal fat. Postsurgical change involving the stomach and small bowel. No small bowel obstruction or evidence of inflammation. Fecalization of distal small bowel contents. Appendix not visualized, no evidence of appendicitis. Small to moderate volume of stool in the colon. Descending and sigmoid diverticulosis without diverticulitis. No colonic inflammation. Vascular/Lymphatic: Advanced aorta bi-iliac atherosclerosis. Fusiform ectasia of the infrarenal aorta maximal dimension 3.0  cm aneurysmal dilatation of the right common iliac artery measuring 2.2 cm proximally and 3.4 cm distally, unchanged. No adjacent stranding. Aneurysm at the internal external bifurcation at 2.4 cm. There is branch vessel atherosclerosis. No definite enlarged abdominopelvic lymph nodes allowing for limited limitations related to lack of contrast and paucity of body fat. Reproductive: Prominent prostate gland of 4.9 cm. Other: Stable focal rounded haziness in the right omentum, series 2, image 33. tiny adjacent nodule measuring 5 mm, series 2, image 32. No ascites. No free air. Musculoskeletal: Chronic L2 compression fracture. Bones are diffusely under mineralized. Multilevel degenerative change in the spine. No evidence of focal bone lesion. IMPRESSION: 1. Distended urinary bladder with possible punctate stone dependently. Nonobstructing stone in the right kidney. No hydronephrosis or obstructive uropathy. 2. Colonic diverticulosis without diverticulitis. 3. Stable focal rounded haziness in the right omentum since 08/05/2019 CT. There is a tiny 5 mm adjacent soft tissue nodule, that is nonspecific, better seen on the current exam. Recommend attention on follow-up. 4. Post left nephrectomy. Post reported Whipple procedure. Pneumobilia is likely postprocedural. 5. Aortic and right iliac aneurysms, not significantly changed from prior exam. Aortic aneurysm up to 3 cm, with right iliac aneurysm up to 3.4 cm. Recommend followup by ultrasound in 3 years. This recommendation follows ACR consensus guidelines: White Paper of the ACR Incidental Findings Committee II on Vascular Findings. J Am Coll Radiol 2013; 10:789-794. Aortic aneurysm NOS (ICD10-I71.9) Aortic Atherosclerosis (ICD10-I70.0) Electronically Signed   By: Keith Rake M.D.   On: 12/31/2019 00:12        Scheduled Meds: . insulin aspart  0-9 Units Subcutaneous Q4H  . [START ON 01/03/2020] pantoprazole  40 mg Intravenous Q12H   Continuous Infusions: .  sodium chloride Stopped (12/30/19 2048)  . pantoprozole (PROTONIX) infusion 8 mg/hr (01/01/20 0742)     LOS: 1 day     Georgette Shell, MD 01/01/2020, 2:59 PM

## 2020-01-01 NOTE — Progress Notes (Addendum)
[facility] Progress Note  CC:  UGIB  Subjective:  Feels good.  No further sign of bleeding, no BM at all.  Hgb 6.7 grams this AM so received another unit PRBC's.  Review of systems: Denies chest pain dyspnea or dysuria Remainder systems negative except as above  Objective:  Vital signs in last 24 hours: Temp:  [97.7 F (36.5 C)-98.7 F (37.1 C)] 97.7 F (36.5 C) (04/21 0744) Pulse Rate:  [69-96] 73 (04/21 0744) Resp:  [12-19] 16 (04/21 0744) BP: (109-149)/(54-96) 124/66 (04/21 0744) SpO2:  [97 %-100 %] 99 % (04/21 0744) Weight:  [63.8 kg] 63.8 kg (04/21 0428) Last BM Date: 12/30/19 General:  Alert, Well-developed, in NAD Heart:  Regular rate and rhythm; no murmurs Pulm:  CTAB.  No increased WOB. Abdomen:  Soft, non-distended.  BS present.  Non-tender. Extremities:  Without edema. Neurologic:  Alert and oriented x 4;  grossly normal neurologically. Psych:  Alert and cooperative. Normal mood and affect.  Intake/Output from previous day: 04/20 0701 - 04/21 0700 In: 650 [P.O.:255; I.V.:80; Blood:315] Out: -   Lab Results: Recent Labs    12/30/19 1925 12/30/19 1925 12/30/19 1937 12/30/19 1937 12/31/19 0814 12/31/19 0814 12/31/19 1630 01/01/20 0034 01/01/20 0605  WBC 12.2*  --   --   --  6.1  --   --   --   --   HGB 5.2*   < > 5.1*   < > 7.0*   < > 7.2* 7.0* 6.7*  HCT 17.4*  --  15.0*  --  21.8*  --   --   --   --   PLT 180  --   --   --  113*  --   --   --   --    < > = values in this interval not displayed.   BMET Recent Labs    12/30/19 1925 12/30/19 1937 12/31/19 0814  NA 130* 131* 136  K 5.4* 5.3* 4.3  CL 101 101 110  CO2 19*  --  19*  GLUCOSE 212* 205* 175*  BUN 56* 54* 55*  CREATININE 1.87* 1.80* 1.59*  CALCIUM 8.1*  --  7.2*   LFT Recent Labs    12/30/19 1925  PROT 5.1*  ALBUMIN 3.0*  AST 18  ALT 18  ALKPHOS 70  BILITOT 0.5   PT/INR Recent Labs    12/30/19 1925  LABPROT 15.0  INR 1.2   DG Chest 2  View  Result Date: 12/30/2019 CLINICAL DATA:  Dizziness EXAM: CHEST - 2 VIEW COMPARISON:  08/22/2019 FINDINGS: Aortic valve stent endograft. Both lungs are clear. Disc degenerative disease and ankylosis of the thoracic spine. IMPRESSION: No acute abnormality of the lungs.  Aortic valve stent endograft. Electronically Signed   By: Eddie Candle M.D.   On: 12/30/2019 20:35   CT RENAL STONE STUDY  Result Date: 12/31/2019 CLINICAL DATA:  Flank pain. Dizziness and weakness. Where a electronic medical record abscess history of renal cell carcinoma and pancreatic cancer. Post Whipple procedure and left nephrectomy. EXAM: CT ABDOMEN AND PELVIS WITHOUT CONTRAST TECHNIQUE: Multidetector CT imaging of the abdomen and pelvis was performed following the standard protocol without IV contrast. COMPARISON:  Most recent CT 08/05/2019. Contrast-enhanced exam 12/25/2017 FINDINGS: Lower chest: Heart is normal in size. Coronary artery calcifications. Emphysema. Stable peribronchovascular nodularity in the left lower lobe consistent with post infectious or inflammatory scarring. Hepatobiliary: Pneumobilia in the left lobe, likely postprocedural. No evidence of focal hepatic lesion  on noncontrast exam. Clips in the gallbladder fossa postcholecystectomy. No biliary dilatation. Pancreas: Not well-defined on the current exam given lack of contrast and paucity of intra-abdominal fat. There are surgical clips in the expected location of the pancreatic head. Spleen: Normal in size. No evidence of focal abnormality. Adrenals/Urinary Tract: Post left nephrectomy. Left adrenal gland is not visualized. Normal right adrenal gland. Nonobstructing stone in the lower right kidney. Small low-attenuation lesion in the posterior right kidney is too small to characterize but likely small cyst. No right hydronephrosis. Urinary bladder distended. No bladder wall thickening. Possible punctate stone in the dependent bladder, series 2, image 70.  Stomach/Bowel: Bowel evaluation is limited in the absence of contrast and paucity of intra-abdominal fat. Postsurgical change involving the stomach and small bowel. No small bowel obstruction or evidence of inflammation. Fecalization of distal small bowel contents. Appendix not visualized, no evidence of appendicitis. Small to moderate volume of stool in the colon. Descending and sigmoid diverticulosis without diverticulitis. No colonic inflammation. Vascular/Lymphatic: Advanced aorta bi-iliac atherosclerosis. Fusiform ectasia of the infrarenal aorta maximal dimension 3.0 cm aneurysmal dilatation of the right common iliac artery measuring 2.2 cm proximally and 3.4 cm distally, unchanged. No adjacent stranding. Aneurysm at the internal external bifurcation at 2.4 cm. There is branch vessel atherosclerosis. No definite enlarged abdominopelvic lymph nodes allowing for limited limitations related to lack of contrast and paucity of body fat. Reproductive: Prominent prostate gland of 4.9 cm. Other: Stable focal rounded haziness in the right omentum, series 2, image 33. tiny adjacent nodule measuring 5 mm, series 2, image 32. No ascites. No free air. Musculoskeletal: Chronic L2 compression fracture. Bones are diffusely under mineralized. Multilevel degenerative change in the spine. No evidence of focal bone lesion. IMPRESSION: 1. Distended urinary bladder with possible punctate stone dependently. Nonobstructing stone in the right kidney. No hydronephrosis or obstructive uropathy. 2. Colonic diverticulosis without diverticulitis. 3. Stable focal rounded haziness in the right omentum since 08/05/2019 CT. There is a tiny 5 mm adjacent soft tissue nodule, that is nonspecific, better seen on the current exam. Recommend attention on follow-up. 4. Post left nephrectomy. Post reported Whipple procedure. Pneumobilia is likely postprocedural. 5. Aortic and right iliac aneurysms, not significantly changed from prior exam. Aortic  aneurysm up to 3 cm, with right iliac aneurysm up to 3.4 cm. Recommend followup by ultrasound in 3 years. This recommendation follows ACR consensus guidelines: White Paper of the ACR Incidental Findings Committee II on Vascular Findings. J Am Coll Radiol 2013; 10:789-794. Aortic aneurysm NOS (ICD10-I71.9) Aortic Atherosclerosis (ICD10-I70.0) Electronically Signed   By: Keith Rake M.D.   On: 12/31/2019 00:12   Assessment / Plan: 1.  Acute GI bleed: Likely upper given melena consider from aspirin added in December, 5.1gram Hgb--> status post 2 units PRBCs --> 7.0 grams, and Protonix infusion, patient stable; consider PUD versus other.  Hgb this AM down to 6.7 grams again so given one more unit PRBC's. 2.  Acute blood loss anemia: From above, see above. 3.  History of CAD status post stenting last in 08/2019: holding Plavix and aspirin for now, last dose 4/19. 4.  History of pancreatic cancer status post Whipple 5.  History of renal cell cancer status post nephrectomy 6.  Status post TAVR 7.  Acute on chronic kidney disease stage III  -EGD on 4/22 with Dr. [name]. [facility] for clear liquids today then NPO after midnight. -Monitor Hgb and transfuse further prn.  Please notify our team for any sign of active bleeding. -  Ok to continue PPI gtt for now given the fact that Plavix still in system and has not yet been evaluated endoscopically.   LOS: 1 day   Laban Emperor. Zehr  01/01/2020, 9:14 AM   I have discussed the case with the PA, and that is the plan I formulated. I personally interviewed and examined the patient.  His wife was with him for the entire visit.  He reports feeling considerably better today.  He only had 1 small black stool earlier today.  Hemoglobin down, but overall scenario shows stability. He looks fairly well today.  He is in good spirits and in no distress.  Hungry and thirsty but bearing it well. Bleeding has most likely stopped at this point. Multiple chronic medical  conditions. Aspirin and Plavix for coronary disease with prior stent.  Suspected upper GI bleed, probable peptic ulcer either from aspirin or perhaps Whipple anastomosis.  He is on a Protonix drip, currently getting PRBC transfusion.  Exam benign, vital stable. Pulse in the 70s on exam.  Plan for upper endoscopy tomorrow with Dr. [name].  He was agreeable after discussion of procedure and risks.  The benefits and risks of the planned procedure were described in detail with the patient or (when appropriate) their health care proxy.  Risks were outlined as including, but not limited to, bleeding, infection, perforation, adverse medication reaction leading to cardiac or pulmonary decompensation, pancreatitis (if ERCP).  The limitation of incomplete mucosal visualization was also discussed.  No guarantees or warranties were given.  Patient at increased risk for cardiopulmonary complications of procedure due to medical comorbidities.    Nelida Meuse III Office: [phone]

## 2020-01-02 ENCOUNTER — Encounter (HOSPITAL_COMMUNITY)
Admission: EM | Disposition: A | Discharge status: Home / Self Care | Payer: Self-pay | Source: Home / Self Care | Attending: Internal Medicine

## 2020-01-02 ENCOUNTER — Encounter (HOSPITAL_COMMUNITY): Payer: Self-pay | Admitting: Internal Medicine

## 2020-01-02 ENCOUNTER — Inpatient Hospital Stay (HOSPITAL_COMMUNITY): Payer: Medicare Other | Admitting: Certified Registered Nurse Anesthetist

## 2020-01-02 HISTORY — PX: ESOPHAGOGASTRODUODENOSCOPY (EGD) WITH PROPOFOL: SHX5813

## 2020-01-02 HISTORY — PX: SCLEROTHERAPY: SHX6841

## 2020-01-02 HISTORY — PX: HOT HEMOSTASIS: SHX5433

## 2020-01-02 HISTORY — PX: HEMOSTASIS CLIP PLACEMENT: SHX6857

## 2020-01-02 LAB — GLUCOSE, CAPILLARY
Glucose-Capillary: 118 mg/dL — ABNORMAL HIGH (ref 70–99)
Glucose-Capillary: 122 mg/dL — ABNORMAL HIGH (ref 70–99)
Glucose-Capillary: 133 mg/dL — ABNORMAL HIGH (ref 70–99)
Glucose-Capillary: 142 mg/dL — ABNORMAL HIGH (ref 70–99)
Glucose-Capillary: 146 mg/dL — ABNORMAL HIGH (ref 70–99)
Glucose-Capillary: 161 mg/dL — ABNORMAL HIGH (ref 70–99)
Glucose-Capillary: 165 mg/dL — ABNORMAL HIGH (ref 70–99)

## 2020-01-02 LAB — HEMOGLOBIN
Hemoglobin: 9.5 g/dL — ABNORMAL LOW (ref 13.0–17.0)
Hemoglobin: 9.9 g/dL — ABNORMAL LOW (ref 13.0–17.0)

## 2020-01-02 LAB — CBC
HCT: 31.7 % — ABNORMAL LOW (ref 39.0–52.0)
Hemoglobin: 10.4 g/dL — ABNORMAL LOW (ref 13.0–17.0)
MCH: 30.8 pg (ref 26.0–34.0)
MCHC: 32.8 g/dL (ref 30.0–36.0)
MCV: 93.8 fL (ref 80.0–100.0)
Platelets: 107 10*3/uL — ABNORMAL LOW (ref 150–400)
RBC: 3.38 MIL/uL — ABNORMAL LOW (ref 4.22–5.81)
RDW: 15.6 % — ABNORMAL HIGH (ref 11.5–15.5)
WBC: 14.4 10*3/uL — ABNORMAL HIGH (ref 4.0–10.5)
nRBC: 0 % (ref 0.0–0.2)

## 2020-01-02 SURGERY — ESOPHAGOGASTRODUODENOSCOPY (EGD) WITH PROPOFOL
Anesthesia: Monitor Anesthesia Care

## 2020-01-02 MED ORDER — SODIUM CHLORIDE (PF) 0.9 % IJ SOLN
PREFILLED_SYRINGE | INTRAMUSCULAR | Status: DC | PRN
  Administered 2020-01-02: 5 mL

## 2020-01-02 MED ORDER — PROPOFOL 500 MG/50ML IV EMUL
INTRAVENOUS | Status: DC | PRN
  Administered 2020-01-02: 135 ug/kg/min via INTRAVENOUS

## 2020-01-02 MED ORDER — PROPOFOL 500 MG/50ML IV EMUL
INTRAVENOUS | Status: DC | PRN
  Administered 2020-01-02 (×2): 25 mg via INTRAVENOUS

## 2020-01-02 MED ORDER — PROPOFOL 1000 MG/100ML IV EMUL
INTRAVENOUS | Status: AC
  Filled 2020-01-02: qty 100

## 2020-01-02 MED ORDER — ONDANSETRON HCL 4 MG/2ML IJ SOLN
INTRAMUSCULAR | Status: DC | PRN
  Administered 2020-01-02: 4 mg via INTRAVENOUS

## 2020-01-02 MED ORDER — SODIUM CHLORIDE 0.9 % IV SOLN: INTRAVENOUS | Status: DC

## 2020-01-02 MED ORDER — PROPOFOL 500 MG/50ML IV EMUL
INTRAVENOUS | Status: AC
  Filled 2020-01-02: qty 100

## 2020-01-02 MED ORDER — PROPOFOL 10 MG/ML IV BOLUS
INTRAVENOUS | Status: AC
  Filled 2020-01-02: qty 20

## 2020-01-02 MED ORDER — LACTATED RINGERS IV SOLN: INTRAVENOUS | Status: DC

## 2020-01-02 MED ORDER — LIDOCAINE HCL (CARDIAC) PF 100 MG/5ML IV SOSY
PREFILLED_SYRINGE | INTRAVENOUS | Status: DC | PRN
  Administered 2020-01-02: 50 mg via INTRATRACHEAL

## 2020-01-02 SURGICAL SUPPLY — 15 items

## 2020-01-02 NOTE — Progress Notes (Signed)
PROGRESS NOTE    Manuel Olson  LDJ:[id number] DOB: [birth year] DOA: 12/30/2019 PCP: [name], MD  Brief Narrative:74 y.o.malewithhistory of CAD status post stenting, status post TAVR, hypertension diabetes mellitus, pancreatic cancer status post Whipple's procedure, nephrectomy for renal cell cancer presents to the ER the patient was feeling weak and dizzy and hypotensive at home. At the home patient also had a large amount of black tarry stool. Denies taking any NSAIDs. Patient is on aspirin and Plavix CAD. Over the last 1 week patient has been having some abdominal discomfort mostly epigastric area denies any vomiting.  ED Course:In the ER blood work show hemoglobin of 5.2 which dropped from 9.1 about 4 days ago. Stool for occult blood was positive and melanotic. ER physician discussed with on-call of our gastroenterologist who advised transfusion and placing patient on Protonix and will be seeing patient in consult for EGD. On exam patient abdomen appears benign. EKG shows normal sinus rhythm. Labs show acute on chronic kidney disease stage III with creatinine around 1.8 which is worsened from 1.5 few months ago. High-sensitivity troponin was negative Covid test was negative.  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  Holding aspirin and Plavix at this time.  He received 4 units of blood transfusion this admit.EGD actively bleeding anastomotic ulcer. On CLD and protonix drip. HB 9.9  #2 history of CAD/stent December 2020 on Plavix at home which is currently on hold due to upper GI bleed  #3 history of pancreatic cancer status  post Whipple's procedure  #4 status post nephrectomy for renal cell carcinoma  #5 status post TAVR  #6 AKI on CKD stage III stable  #7 type 2 diabetes on SSI continue the same for now.    Estimated body mass index is 20.15 kg/m as calculated from the following:   Height as of this encounter: 5' 10"  (1.778 m).   Weight as of this encounter: 63.7 kg.  DVT prophylaxis: scd Code Status: full Family Communication: dw patient  Disposition Plan:Status is: Inpatient   Dispo: The patient is from: home              Anticipated d/c is to: home              Anticipated d/c date is: 1 to 2 days              Patient currently not ready to dc due to UGI bleed   Consultants:   gi  Procedures: egd 4/22 Antimicrobials: none  Subjective: Had a bm today black in color no vomiting  Objective: Vitals:   01/01/20 1548 01/01/20 2058 01/02/20 0445 01/02/20 1318  BP: (!) 142/62 (!) 163/77 116/62 (!) 191/81  Pulse: 71 73 73 77  Resp: 16 18 16 20   Temp: 98.7 F (37.1 C) 98.1 F (36.7 C) 98 F (36.7 C) 98.8 F (37.1 C)  TempSrc: Oral Oral Oral Oral  SpO2: 100% 99% 97% 99%  Weight:   63.7 kg   Height:        Intake/Output Summary (Last 24 hours) at 01/02/2020 1336 Last data filed at 01/01/2020 1700  Gross per 24 hour  Intake 688.95 ml  Output --  Net 688.95 ml   Filed Weights   12/30/19 1820 01/01/20 0428 01/02/20 0445  Weight: 61.5 kg 63.8 kg 63.7 kg    Examination:  General exam: Appears calm and comfortable  Respiratory system: Clear to auscultation. Respiratory effort normal. Cardiovascular system: S1 & S2 heard, RRR. No JVD, murmurs, rubs, gallops or clicks. No pedal edema. Gastrointestinal system: Abdomen is nondistended, soft and nontender. No organomegaly or masses felt. Normal bowel sounds heard. Central nervous system: Alert and oriented. No focal neurological deficits. Extremities: Symmetric 5 x 5 power. Skin: No rashes, lesions or ulcers Psychiatry:  Judgement and insight appear normal. Mood & affect appropriate.     Data Reviewed: I have personally reviewed following labs and imaging studies  CBC: Recent Labs  Lab 12/30/19 1925 12/30/19 1925 12/30/19 1937 12/30/19 1937 12/31/19 0814 12/31/19 1630 01/01/20 0605 01/01/20 1243 01/01/20 1858 01/02/20 0444 01/02/20 1013  WBC 12.2*  --   --   --  6.1  --   --   --   --   --   --   NEUTROABS 9.7*  --   --   --   --   --   --   --   --   --   --   HGB 5.2*   < > 5.1*   < > 7.0*   < > 6.7* 8.2* 9.6* 9.5* 9.9*  HCT 17.4*  --  15.0*  --  21.8*  --   --   --  28.7*  --   --   MCV 100.6*  --   --   --  93.2  --   --   --   --   --   --   PLT 180  --   --   --  113*  --   --   --   --   --   --    < > = values in this interval not displayed.   Basic Metabolic Panel: Recent Labs  Lab 12/30/19 1925 12/30/19 1937 12/31/19 0814 01/01/20 1858  NA 130* 131* 136 136  K 5.4* 5.3* 4.3 4.4  CL 101 101 110 107  CO2 19*  --  19* 21*  GLUCOSE 212* 205* 175* 146*  BUN 56* 54* 55* 54*  CREATININE 1.87* 1.80* 1.59* 1.85*  CALCIUM 8.1*  --  7.2* 7.9*  MG  --   --   --  2.0   GFR: Estimated Creatinine Clearance: 30.6 mL/min (A) (by C-G formula based on SCr of 1.85 mg/dL (H)). Liver Function Tests: Recent Labs  Lab 12/30/19 1925 01/01/20 1858  AST 18 23  ALT 18 19  ALKPHOS 70 58  BILITOT 0.5 1.4*  PROT 5.1* 4.7*  ALBUMIN 3.0* 2.8*   No results for input(s): LIPASE, AMYLASE in the last 168 hours. No results for input(s): AMMONIA in the last 168 hours. Coagulation Profile: Recent Labs  Lab 12/30/19 1925  INR 1.2   Cardiac Enzymes: No results for input(s): CKTOTAL, CKMB, CKMBINDEX, TROPONINI in the last 168 hours. BNP (last 3 results) No results for input(s): PROBNP in the last 8760 hours. HbA1C: No results for input(s): HGBA1C in the last 72 hours. CBG: Recent Labs  Lab 01/01/20 2102 01/02/20 0011 01/02/20 0443 01/02/20 0741 01/02/20 1145  GLUCAP 125* 142* 118* 146*  122*   Lipid Profile: No results for input(s): CHOL, HDL, LDLCALC, TRIG, CHOLHDL, LDLDIRECT in  the last 72 hours. Thyroid Function Tests: No results for input(s): TSH, T4TOTAL, FREET4, T3FREE, THYROIDAB in the last 72 hours. Anemia Panel: Recent Labs    12/30/19 2008  VITAMINB12 174*  FOLATE 18.6  FERRITIN 8*  TIBC 379  IRON 12*  RETICCTPCT 3.2*   Sepsis Labs: No results for input(s): PROCALCITON, LATICACIDVEN in the last 168 hours.  Recent Results (from the past 240 hour(s))  Respiratory Panel by RT PCR (Flu A&B, Covid) - Nasopharyngeal Swab     Status: None   Collection Time: 12/30/19  9:42 PM   Specimen: Nasopharyngeal Swab  Result Value Ref Range Status   SARS Coronavirus 2 by RT PCR NEGATIVE NEGATIVE Final    Comment: (NOTE) SARS-CoV-2 target nucleic acids are NOT DETECTED. The SARS-CoV-2 RNA is generally detectable in upper respiratoy specimens during the acute phase of infection. The lowest concentration of SARS-CoV-2 viral copies this assay can detect is 131 copies/mL. A negative result does not preclude SARS-Cov-2 infection and should not be used as the sole basis for treatment or other patient management decisions. A negative result may occur with  improper specimen collection/handling, submission of specimen other than nasopharyngeal swab, presence of viral mutation(s) within the areas targeted by this assay, and inadequate number of viral copies (<131 copies/mL). A negative result must be combined with clinical observations, patient history, and epidemiological information. The expected result is Negative. Fact Sheet for Patients:  PinkCheek.be Fact Sheet for Healthcare Providers:  GravelBags.it This test is not yet ap proved or cleared by the [place] FDA and  has been authorized for detection and/or diagnosis of SARS-CoV-2 by FDA under an Emergency Use Authorization (EUA). This EUA will remain    in effect (meaning this test can be used) for the duration of the COVID-19 declaration under Section 564(b)(1) of the Act, 21 U.S.C. section 360bbb-3(b)(1), unless the authorization is terminated or revoked sooner.    Influenza A by PCR NEGATIVE NEGATIVE Final   Influenza B by PCR NEGATIVE NEGATIVE Final    Comment: (NOTE) The Xpert Xpress SARS-CoV-2/FLU/RSV assay is intended as an aid in  the diagnosis of influenza from Nasopharyngeal swab specimens and  should not be used as a sole basis for treatment. Nasal washings and  aspirates are unacceptable for Xpert Xpress SARS-CoV-2/FLU/RSV  testing. Fact Sheet for Patients: PinkCheek.be Fact Sheet for Healthcare Providers: GravelBags.it This test is not yet approved or cleared by the [place] FDA and  has been authorized for detection and/or diagnosis of SARS-CoV-2 by  FDA under an Emergency Use Authorization (EUA). This EUA will remain  in effect (meaning this test can be used) for the duration of the  Covid-19 declaration under Section 564(b)(1) of the Act, 21  U.S.C. section 360bbb-3(b)(1), unless the authorization is  terminated or revoked. Performed at [facility], [place] [address], [place], [place] [address]          Radiology Studies: No results found.      Scheduled Meds: . [MAR Hold] insulin aspart  0-9 Units Subcutaneous Q4H  . [MAR Hold] pantoprazole  40 mg Intravenous Q12H   Continuous Infusions: . [MAR Hold] sodium chloride Stopped (12/30/19 2048)  . sodium chloride    . lactated ringers 20 mL/hr at 01/02/20 1334  . pantoprozole (PROTONIX) infusion Stopped (01/02/20 1100)      Georgette Shell, MD [facility] Pager [phone]  If 7PM-7AM, please contact night-coverage [email or web address] Password [facility] 01/02/2020, 1:36 PM

## 2020-01-02 NOTE — Anesthesia Preprocedure Evaluation (Addendum)
Anesthesia Evaluation  Patient identified by MRN, date of birth, ID band Patient awake    Reviewed: Allergy & Precautions, NPO status , Patient's Chart, lab work & pertinent test results  History of Anesthesia Complications Negative for: history of anesthetic complications  Airway Mallampati: II  TM Distance: >3 FB Neck ROM: Full    Dental  (+) Dental Advisory Given   Pulmonary neg recent URI, former smoker,    breath sounds clear to auscultation       Cardiovascular hypertension, + CAD, + Past MI and + Cardiac Stents  (-) CHF (-) dysrhythmias + Valvular Problems/Murmurs  Rhythm:Regular + Systolic murmurs 15/8309: S/p TAVR 08/2019: Non-ST elevation myocardial infarction due to de novo plaque rupture in the distal RCA.  Complicated but ultimately successful distal RCA stent using a 22 x 2.75 Onyx postdilated to 3.0 decreasing 99% stenosis to 0% with TIMI grade III flow.  Left main is widely patent  LAD contains a patent mid vessel stent.  Proximal to the stent is eccentric 50% narrowing.  First diagonal contains a stent that is widely patent.  A moderate size ramus intermedius contains 50 to 75% stenosis in its mid segment.  Circumflex is relatively small and is widely patent.  The SAPIEN TAVR valve was not crossed.  07/2019: 1. Left ventricular ejection fraction, by visual estimation, is 50%. The  left ventricle has low normal function. There is borderline left  ventricular hypertrophy.  2. Elevated left atrial pressure.  3. Left ventricular diastolic parameters are consistent with Grade I  diastolic dysfunction (impaired relaxation).  4. The left ventricle demonstrates global hypokinesis.  5. Global right ventricle has normal systolic function.The right  ventricular size is normal. No increase in right ventricular wall  thickness.  6. Left atrial size was normal.  7. Right atrial size was normal.  8. Presence  of pericardial fat pad.  9. Mild to moderate mitral annular calcification.  10. The mitral valve is grossly normal. Trace mitral valve regurgitation.  11. The tricuspid valve is grossly normal. Tricuspid valve regurgitation  is trivial.   Cardiac stent x 5, last 08/2019   Neuro/Psych negative neurological ROS  negative psych ROS   GI/Hepatic GERD  ,? GI bleed pancreatic cancer status post Whipple's procedure,   Endo/Other  diabetes, Insulin Dependent  Renal/GU CRFRenal diseaseStage 3     Musculoskeletal  (+) Arthritis ,   Abdominal   Peds  Hematology  (+) Blood dyscrasia, anemia , Plavix for cardiac stent   Anesthesia Other Findings 74 y.o. male with history of CAD status post stenting, status post TAVR, hypertension diabetes mellitus, pancreatic cancer status post Whipple's procedure, nephrectomy for renal cell cancer presents to the ER the patient was feeling weak and dizzy and hypotensive at home.  At the home patient also had a large amount of black tarry stool.  Denies taking any NSAIDs.  Patient is on aspirin and Plavix CAD  Reproductive/Obstetrics                            Anesthesia Physical Anesthesia Plan  ASA: III  Anesthesia Plan: MAC   Post-op Pain Management:    Induction: Intravenous  PONV Risk Score and Plan: 1 and Treatment may vary due to age or medical condition and Propofol infusion  Airway Management Planned: Nasal Cannula  Additional Equipment: None  Intra-op Plan:   Post-operative Plan:   Informed Consent: I have reviewed the patients History and Physical,  chart, labs and discussed the procedure including the risks, benefits and alternatives for the proposed anesthesia with the patient or authorized representative who has indicated his/her understanding and acceptance.     Dental advisory given  Plan Discussed with: CRNA  Anesthesia Plan Comments:         Anesthesia Quick Evaluation

## 2020-01-02 NOTE — Op Note (Signed)
[facility] Patient Name: Manuel Olson Procedure Date: 01/02/2020 MRN: [id number] Attending MD: Jackquline Denmark , MD Date of Birth: March 30, 1946 CSN: [id number] Age: 74 Admit Type: Inpatient Procedure:                Upper GI endoscopy Indications:              Recent gastrointestinal bleeding Providers:                Jackquline Denmark, MD, Grace Isaac, RN, Doristine Johns, RN, Dayle Points, CRNA Referring MD:              Medicines:                Monitored Anesthesia Care Complications:            No immediate complications. Estimated Blood Loss:     Estimated blood loss was minimal. Procedure:                Pre-Anesthesia Assessment:                           - Prior to the procedure, a History and Physical                            was performed, and patient medications and                            allergies were reviewed. The patient's tolerance of                            previous anesthesia was also reviewed. The risks                            and benefits of the procedure and the sedation                            options and risks were discussed with the patient.                            All questions were answered, and informed consent                            was obtained. Prior Anticoagulants: The patient has                            taken no previous anticoagulant or antiplatelet                            agents. ASA Grade Assessment: III - A patient with                            severe systemic disease. After reviewing the risks  and benefits, the patient was deemed in                            satisfactory condition to undergo the procedure.                           After obtaining informed consent, the endoscope was                            passed under direct vision. Throughout the                            procedure, the patient's blood pressure, pulse, and        oxygen saturations were monitored continuously. The                            GIF-H190 ([id number]) Olympus gastroscope was                            introduced through the mouth, and advanced to the                            jejunum. The upper GI endoscopy was accomplished                            without difficulty. The patient tolerated the                            procedure well. Scope In: Scope Out: Findings:      The examined esophagus was normal except for small hiatal hernia.      Examination of the stomach was limited due to presence of food/vegetable       material.      Evidence of a pylorus-sparing Whipple was found in the stomach. 1 cm       deep ulcer with active oozing was noted at gastrojejunostomy       anastomosis. Photodocumentation is obtained. Area was successfully       injected with 5 mL of a 1:10,000 solution of epinephrine for hemostasis.       Coagulation for hemostasis using 7 Fr monopolar probe was successful.       For hemostasis, three hemostatic clips were successfully placed (MR       conditional). There was no bleeding at the end of the procedure.      Both limbs of jejunum were intubated. These were normal. Impression:               - Actively bleeding anastomotic ulcer s/p                            endoscopic treatment.                           - Small hiatal hernia. Moderate Sedation:      Not Applicable - Patient had care per Anesthesia. Recommendation:           - Return patient to hospital ward for ongoing care.                           -  Clear liquid diet.                           - Give Protonix (pantoprazole) 8 mg/hr IV by                            continuous infusion x 72hrs                           - Check Hb Q6 hrs x 4. Transfuse to keep Hb>7.                           - If rebleeds, IR consultation for embolization as                            this will not be amenable to repeat endoscopic                             treatment.                           - He would require repeat EGD in 6 to 8 weeks to                            reevaluate.                           - No nonsteroidals                           - D/W patient in detail. 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 All rights reserved. The codes documented in this report are preliminary and upon coder review may  be revised to meet current compliance requirements. Jackquline Denmark, MD 01/02/2020 3:40:21 PM This report has been signed electronically. Number of Addenda: 0

## 2020-01-02 NOTE — Transfer of Care (Signed)
Immediate Anesthesia Transfer of Care Note  Patient: Manuel Olson  Procedure(s) Performed: ESOPHAGOGASTRODUODENOSCOPY (EGD) WITH PROPOFOL (N/A ) SCLEROTHERAPY HOT HEMOSTASIS (ARGON PLASMA COAGULATION/BICAP) (N/A ) HEMOSTASIS CLIP PLACEMENT  Patient Location: Endoscopy Unit  Anesthesia Type:MAC  Level of Consciousness: awake, drowsy, patient cooperative and responds to stimulation  Airway & Oxygen Therapy: Patient Spontanous Breathing and Patient connected to face mask oxygen  Post-op Assessment: Report given to RN and Post -op Vital signs reviewed and stable  Post vital signs: Reviewed and stable  Last Vitals:  Vitals Value Taken Time  BP    Temp    Pulse 86 01/02/20 1516  Resp 16 01/02/20 1516  SpO2 100 % 01/02/20 1516  Vitals shown include unvalidated device data.  Last Pain:  Vitals:   01/02/20 1318  TempSrc: Oral  PainSc: 0-No pain         Complications: No apparent anesthesia complications

## 2020-01-02 NOTE — Interval H&P Note (Signed)
History and Physical Interval Note:  01/02/2020 1:41 PM  Manuel Olson  has presented today for surgery, with the diagnosis of Upper Gi Bleed.  The various methods of treatment have been discussed with the patient and family. After consideration of risks, benefits and other options for treatment, the patient has consented to  Procedure(s): ESOPHAGOGASTRODUODENOSCOPY (EGD) WITH PROPOFOL (N/A) as a surgical intervention.  The patient's history has been reviewed, patient examined, no change in status, stable for surgery.  I have reviewed the patient's chart and labs.  Questions were answered to the patient's satisfaction.     Jackquline Denmark

## 2020-01-03 ENCOUNTER — Encounter: Payer: Self-pay | Admitting: *Deleted

## 2020-01-03 LAB — CBC
HCT: 27.2 % — ABNORMAL LOW (ref 39.0–52.0)
HCT: 27.5 % — ABNORMAL LOW (ref 39.0–52.0)
HCT: 28.5 % — ABNORMAL LOW (ref 39.0–52.0)
HCT: 28.2 % — ABNORMAL LOW (ref 39.0–52.0)
Hemoglobin: 9.1 g/dL — ABNORMAL LOW (ref 13.0–17.0)
Hemoglobin: 9 g/dL — ABNORMAL LOW (ref 13.0–17.0)
Hemoglobin: 9.1 g/dL — ABNORMAL LOW (ref 13.0–17.0)
Hemoglobin: 9.1 g/dL — ABNORMAL LOW (ref 13.0–17.0)
MCH: 31 pg (ref 26.0–34.0)
MCH: 30.4 pg (ref 26.0–34.0)
MCH: 30.6 pg (ref 26.0–34.0)
MCH: 30.5 pg (ref 26.0–34.0)
MCHC: 33.5 g/dL (ref 30.0–36.0)
MCHC: 32.7 g/dL (ref 30.0–36.0)
MCHC: 31.9 g/dL (ref 30.0–36.0)
MCHC: 32.3 g/dL (ref 30.0–36.0)
MCV: 92.5 fL (ref 80.0–100.0)
MCV: 92.9 fL (ref 80.0–100.0)
MCV: 96 fL (ref 80.0–100.0)
MCV: 94.6 fL (ref 80.0–100.0)
Platelets: 100 10*3/uL — ABNORMAL LOW (ref 150–400)
Platelets: 103 10*3/uL — ABNORMAL LOW (ref 150–400)
Platelets: 106 10*3/uL — ABNORMAL LOW (ref 150–400)
Platelets: 109 10*3/uL — ABNORMAL LOW (ref 150–400)
RBC: 2.94 MIL/uL — ABNORMAL LOW (ref 4.22–5.81)
RBC: 2.96 MIL/uL — ABNORMAL LOW (ref 4.22–5.81)
RBC: 2.97 MIL/uL — ABNORMAL LOW (ref 4.22–5.81)
RBC: 2.98 MIL/uL — ABNORMAL LOW (ref 4.22–5.81)
RDW: 15.1 % (ref 11.5–15.5)
RDW: 15.2 % (ref 11.5–15.5)
RDW: 15.3 % (ref 11.5–15.5)
RDW: 14.9 % (ref 11.5–15.5)
WBC: 10.5 10*3/uL (ref 4.0–10.5)
WBC: 8.9 10*3/uL (ref 4.0–10.5)
WBC: 10.2 10*3/uL (ref 4.0–10.5)
WBC: 10.1 10*3/uL (ref 4.0–10.5)
nRBC: 0 % (ref 0.0–0.2)
nRBC: 0 % (ref 0.0–0.2)
nRBC: 0 % (ref 0.0–0.2)
nRBC: 0 % (ref 0.0–0.2)

## 2020-01-03 LAB — TYPE AND SCREEN
ABO/RH(D): A POS
Antibody Screen: NEGATIVE
Unit division: 0
Unit division: 0
Unit division: 0
Unit division: 0
Unit division: 0
Unit division: 0

## 2020-01-03 LAB — BPAM RBC
Blood Product Expiration Date: 202105012359
Blood Product Expiration Date: 202105072359
Blood Product Expiration Date: 202105072359
Blood Product Expiration Date: 202105082359
Blood Product Expiration Date: 202105082359
Blood Product Expiration Date: 202105082359
ISSUE DATE / TIME: 202104192244
ISSUE DATE / TIME: 202104200304
ISSUE DATE / TIME: 202104202002
ISSUE DATE / TIME: 202104210720
ISSUE DATE / TIME: 202104211304
Unit Type and Rh: 600
Unit Type and Rh: 6200
Unit Type and Rh: 6200
Unit Type and Rh: 6200
Unit Type and Rh: 6200
Unit Type and Rh: 6200

## 2020-01-03 LAB — GLUCOSE, CAPILLARY
Glucose-Capillary: 156 mg/dL — ABNORMAL HIGH (ref 70–99)
Glucose-Capillary: 158 mg/dL — ABNORMAL HIGH (ref 70–99)
Glucose-Capillary: 158 mg/dL — ABNORMAL HIGH (ref 70–99)
Glucose-Capillary: 267 mg/dL — ABNORMAL HIGH (ref 70–99)
Glucose-Capillary: 269 mg/dL — ABNORMAL HIGH (ref 70–99)
Glucose-Capillary: 63 mg/dL — ABNORMAL LOW (ref 70–99)

## 2020-01-03 NOTE — Progress Notes (Addendum)
[facility] Progress Note  CC:  UGIB  Subjective:  Feels good.  Just had a BM with small pieces of dark colored, formed stool with some small amount of red blood diluting into the toilet.  Wants to eat.  Objective:  Vital signs in last 24 hours: Temp:  [97.6 F (36.4 C)-99 F (37.2 C)] 98.1 F (36.7 C) (04/23 0431) Pulse Rate:  [72-99] 72 (04/23 0431) Resp:  [13-20] 20 (04/23 0431) BP: (121-248)/(66-101) 121/66 (04/23 0431) SpO2:  [96 %-100 %] 96 % (04/23 0431) Last BM Date: 01/01/20 General:  Alert, Well-developed, in NAD Heart:  Regular rate and rhythm; no murmurs Pulm:  CTAB.  No increased WOB. Abdomen:  Soft, non-distended.  BS present.  Non-tender. Extremities:  Without edema. Neurologic:  Alert and oriented x 4;  grossly normal neurologically. Psych:  Alert and cooperative. Normal mood and affect.  Intake/Output from previous day: 04/22 0701 - 04/23 0700 In: 860 [P.O.:360; I.V.:500] Out: -   Lab Results: Recent Labs    01/02/20 1604 01/02/20 2321 01/03/20 0419  WBC 14.4* 10.5 8.9  HGB 10.4* 9.1* 9.0*  HCT 31.7* 27.2* 27.5*  PLT 107* 100* 103*   BMET Recent Labs    01/01/20 1858  NA 136  K 4.4  CL 107  CO2 21*  GLUCOSE 146*  BUN 54*  CREATININE 1.85*  CALCIUM 7.9*   LFT Recent Labs    01/01/20 1858  PROT 4.7*  ALBUMIN 2.8*  AST 23  ALT 19  ALKPHOS 58  BILITOT 1.4*   Assessment / Plan: 1. Acute GI bleed:Likely upper given melenaconsider from aspirin added in December,5.1gram Hgb-->status post 2 units PRBCs -->7.0 grams, and Protonix infusion, patient stable;consider PUD versus other.  Hgb this AM down to 6.7 grams again so given one more unit PRBC's.  EGD on 4/22 revealed actively bleeding anastomotic ulcer s/p epi injection, bicap, and endoclip placement. 2. Acute blood loss anemia: From above, see above.  Hgb stable around 9 grams at last 2 blood draws. 3. History of CAD status post stenting last in  08/2019:holding Plavix and aspirin for now, last dose 4/19. 4. History of pancreatic cancer status post Whipple 5. History of renal cell cancer status post nephrectomy 6. Status post TAVR 7. Acute on chronic kidney disease stage III  -Will continue PPI gtt for now since EGD and intervention just performed yesterday, 4/22. -Monitor Hgb and transfuse to maintain Hgb >7 grams. -Avoid NSAID's. -Full liquids for today until seen by Dr. [name] and then possibly can be advanced to soft diet later this evening. -If rebleeds then will need IR for embolization as this will not be amenable to repeat endoscopic therapy. -Will plan for repeat EGD in 6-8 weeks.  I obtained an OV follow-up for him already and placed it in his discharge instructions.  Repeat EGD will be scheduled at that time. -Dr. [name] to comment on when ok to resume Plavix and ASA 81 mg.   LOS: 3 days   Laban Emperor. Zehr  01/03/2020, 9:44 AM   Attending physician's note   I have taken an interval history, reviewed the chart and examined the patient. I agree with the Advanced Practitioner's note, impression and recommendations.   No further bleeding/melena. Hb stable.  Given 1 more unit of PRBC this morning. "Very hungry"  Plan: -Advance diet to soft diet at 5 PM today -Trend CBC. -Would continue IV Protonix -Plavix in 7 days.  Hold aspirin until cardiology follow-up. -Repeat EGD  in 8 weeks -Discussed with patient and patient's wife.  Carmell Austria, MD [facility] Heckler Fabienne Bruns [phone].

## 2020-01-03 NOTE — Progress Notes (Signed)
PROGRESS NOTE    Manuel Olson  ENI:[id number] DOB: April 15, 1946 DOA: 12/30/2019 PCP: [name], MD  Brief Narrative:74 y.o.malewithhistory of CAD status post stenting, status post TAVR, hypertension diabetes mellitus, pancreatic cancer status post Whipple's procedure, nephrectomy for renal cell cancer presents to the ER the patient was feeling weak and dizzy and hypotensive at home. At the home patient also had a large amount of black tarry stool. Denies taking any NSAIDs. Patient is on aspirin and Plavix CAD. Over the last 1 week patient has been having some abdominal discomfort mostly epigastric area denies any vomiting.  ED Course:In the ER blood work show hemoglobin of 5.2 which dropped from 9.1 about 4 days ago. Stool for occult blood was positive and melanotic. ER physician discussed with on-call of our gastroenterologist who advised transfusion and placing patient on Protonix and will be seeing patient in consult for EGD. On exam patient abdomen appears benign. EKG shows normal sinus rhythm. Labs show acute on chronic kidney disease stage III with creatinine around 1.8 which is worsened from 1.5 few months ago. High-sensitivity troponin was negative Covid test was negative.  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  Status post epi and Endo Clip placement.  Patient received 4 units of blood transfusion.  Holding aspirin and Plavix.  Hemoglobin stable at 9.1.  Continue clear liquid diet and Protonix drip per GI.   If patient rebleeds will consult IR for embolization.  #2 history of CAD/stent December 2020 on Plavix  at home which is currently on hold due to upper GI bleed  #3 history of pancreatic cancer status post Whipple's procedure  #4 status post nephrectomy for renal cell carcinoma  #5 status post TAVR  #6 AKI on CKD stage III stable  #7 type 2 diabetes on SSI continue the same for now.   Estimated body mass index is 20.15 kg/m as calculated from the following:   Height as of this encounter: 5' 10"  (1.778 m).   Weight as of this encounter: 63.7 kg.  DVT prophylaxis: scd Code Status: full Family Communication: dw patient  Disposition Plan:Status is: Inpatient   Dispo: The patient is from: home  Anticipated d/c is to: home  Anticipated d/c date is: 1 to 2 days  Patient currently not ready to dc due to UGI bleed   Consultants:   gi  Procedures: egd 4/22 Antimicrobials: none  Subjective:  Patient resting in bed he had not had a BM when I saw him this morning.  Later on patient had bowel movements with dark-colored small pieces Asking to advance diet  Objective: Vitals:   01/02/20 1540 01/02/20 1603 01/02/20 2025 01/03/20 0431  BP: (!) 228/90 138/76 128/71 121/66  Pulse: 78 79 76 72  Resp: 14 16 17 20   Temp:  97.6 F (36.4 C) 99 F (37.2 C) 98.1 F (36.7 C)  TempSrc:  Oral    SpO2: 99% 97% 99% 96%  Weight:      Height:        Intake/Output Summary (Last 24 hours) at 01/03/2020 1204 Last data filed at 01/02/2020 1700  Gross per 24 hour  Intake 860 ml  Output --  Net 860 ml   Filed Weights   12/30/19 1820 01/01/20 0428 01/02/20 0445  Weight: 61.5 kg 63.8 kg 63.7 kg    Examination:  General exam: Appears calm and comfortable  Respiratory system: Clear to auscultation. Respiratory effort normal. Cardiovascular system: S1 & S2 heard, RRR. No JVD, murmurs, rubs, gallops or clicks. No pedal edema. Gastrointestinal system: Abdomen is nondistended, soft and nontender. No organomegaly or masses felt. Normal bowel sounds  heard. Central nervous system: Alert and oriented. No focal neurological deficits. Extremities: Symmetric 5 x 5 power. Skin: No rashes, lesions or ulcers Psychiatry: Judgement and insight appear normal. Mood & affect appropriate.     Data Reviewed: I have personally reviewed following labs and imaging studies  CBC: Recent Labs  Lab 12/30/19 1925 12/30/19 1937 12/31/19 0814 12/31/19 1630 01/01/20 1858 01/02/20 0444 01/02/20 1013 01/02/20 1604 01/02/20 2321 01/03/20 0419 01/03/20 1038  WBC 12.2*   < > 6.1  --   --   --   --  14.4* 10.5 8.9 10.2  NEUTROABS 9.7*  --   --   --   --   --   --   --   --   --   --   HGB 5.2*   < > 7.0*   < > 9.6*   < > 9.9* 10.4* 9.1* 9.0* 9.1*  HCT 17.4*   < > 21.8*  --  28.7*  --   --  31.7* 27.2* 27.5* 28.5*  MCV 100.6*   < > 93.2  --   --   --   --  93.8 92.5 92.9 96.0  PLT 180   < > 113*  --   --   --   --  107* 100* 103* 106*   < > = values in this interval not displayed.   Basic Metabolic Panel: Recent Labs  Lab 12/30/19 1925 12/30/19 1937 12/31/19 0814 01/01/20 1858  NA 130* 131* 136 136  K 5.4* 5.3* 4.3 4.4  CL 101 101 110 107  CO2 19*  --  19* 21*  GLUCOSE 212* 205* 175* 146*  BUN 56* 54* 55* 54*  CREATININE 1.87* 1.80* 1.59* 1.85*  CALCIUM 8.1*  --  7.2* 7.9*  MG  --   --   --  2.0   GFR: Estimated Creatinine Clearance: 30.6 mL/min (A) (by C-G formula based on SCr of 1.85 mg/dL (H)). Liver Function Tests: Recent Labs  Lab 12/30/19 1925 01/01/20 1858  AST 18 23  ALT 18 19  ALKPHOS 70 58  BILITOT 0.5 1.4*  PROT 5.1* 4.7*  ALBUMIN 3.0* 2.8*   No results for input(s): LIPASE, AMYLASE in the last 168 hours. No results for input(s): AMMONIA in the last 168 hours. Coagulation Profile: Recent Labs  Lab 12/30/19 1925  INR 1.2   Cardiac Enzymes: No results for input(s): CKTOTAL, CKMB, CKMBINDEX, TROPONINI in the last 168 hours. BNP (last 3 results) No results for input(s): PROBNP in the last 8760 hours. HbA1C: No  results for input(s): HGBA1C in the last 72 hours. CBG: Recent Labs  Lab 01/02/20 2026 01/02/20 2321 01/03/20 0429 01/03/20 0741 01/03/20 1142  GLUCAP 161* 133* 158* 156* 269*   Lipid Profile: No results for input(s): CHOL, HDL, LDLCALC, TRIG, CHOLHDL, LDLDIRECT in the last 72 hours. Thyroid Function Tests: No results for input(s): TSH, T4TOTAL, FREET4, T3FREE, THYROIDAB in the last 72 hours. Anemia Panel: No results for  input(s): VITAMINB12, FOLATE, FERRITIN, TIBC, IRON, RETICCTPCT in the last 72 hours. Sepsis Labs: No results for input(s): PROCALCITON, LATICACIDVEN in the last 168 hours.  Recent Results (from the past 240 hour(s))  Respiratory Panel by RT PCR (Flu A&B, Covid) - Nasopharyngeal Swab     Status: None   Collection Time: 12/30/19  9:42 PM   Specimen: Nasopharyngeal Swab  Result Value Ref Range Status   SARS Coronavirus 2 by RT PCR NEGATIVE NEGATIVE Final    Comment: (NOTE) SARS-CoV-2 target nucleic acids are NOT DETECTED. The SARS-CoV-2 RNA is generally detectable in upper respiratoy specimens during the acute phase of infection. The lowest concentration of SARS-CoV-2 viral copies this assay can detect is 131 copies/mL. A negative result does not preclude SARS-Cov-2 infection and should not be used as the sole basis for treatment or other patient management decisions. A negative result may occur with  improper specimen collection/handling, submission of specimen other than nasopharyngeal swab, presence of viral mutation(s) within the areas targeted by this assay, and inadequate number of viral copies (<131 copies/mL). A negative result must be combined with clinical observations, patient history, and epidemiological information. The expected result is Negative. Fact Sheet for Patients:  PinkCheek.be Fact Sheet for Healthcare Providers:  GravelBags.it This test is not yet ap proved or cleared by the  [place] FDA and  has been authorized for detection and/or diagnosis of SARS-CoV-2 by FDA under an Emergency Use Authorization (EUA). This EUA will remain  in effect (meaning this test can be used) for the duration of the COVID-19 declaration under Section 564(b)(1) of the Act, 21 U.S.C. section 360bbb-3(b)(1), unless the authorization is terminated or revoked sooner.    Influenza A by PCR NEGATIVE NEGATIVE Final   Influenza B by PCR NEGATIVE NEGATIVE Final    Comment: (NOTE) The Xpert Xpress SARS-CoV-2/FLU/RSV assay is intended as an aid in  the diagnosis of influenza from Nasopharyngeal swab specimens and  should not be used as a sole basis for treatment. Nasal washings and  aspirates are unacceptable for Xpert Xpress SARS-CoV-2/FLU/RSV  testing. Fact Sheet for Patients: PinkCheek.be Fact Sheet for Healthcare Providers: GravelBags.it This test is not yet approved or cleared by the [place] FDA and  has been authorized for detection and/or diagnosis of SARS-CoV-2 by  FDA under an Emergency Use Authorization (EUA). This EUA will remain  in effect (meaning this test can be used) for the duration of the  Covid-19 declaration under Section 564(b)(1) of the Act, 21  U.S.C. section 360bbb-3(b)(1), unless the authorization is  terminated or revoked. Performed at [facility], [place] [address], [place], [place] [address]          Radiology Studies: No results found.      Scheduled Meds: . insulin aspart  0-9 Units Subcutaneous Q4H  . pantoprazole  40 mg Intravenous Q12H   Continuous Infusions: . sodium chloride Stopped (12/30/19 2048)     LOS: 3 days     Georgette Shell, MD

## 2020-01-03 NOTE — Anesthesia Postprocedure Evaluation (Signed)
Anesthesia Post Note  Patient: Manuel Olson  Procedure(s) Performed: ESOPHAGOGASTRODUODENOSCOPY (EGD) WITH PROPOFOL (N/A ) SCLEROTHERAPY HOT HEMOSTASIS (ARGON PLASMA COAGULATION/BICAP) (N/A ) HEMOSTASIS CLIP PLACEMENT     Patient location during evaluation: PACU Anesthesia Type: MAC Level of consciousness: awake and alert Pain management: pain level controlled Vital Signs Assessment: post-procedure vital signs reviewed and stable Respiratory status: spontaneous breathing, nonlabored ventilation and respiratory function stable Cardiovascular status: stable and blood pressure returned to baseline Anesthetic complications: no    Last Vitals:  Vitals:   01/03/20 0431 01/03/20 1419  BP: 121/66 129/75  Pulse: 72 69  Resp: 20   Temp: 36.7 C 36.6 C  SpO2: 96% 100%    Last Pain:  Vitals:   01/03/20 1419  TempSrc: Oral  PainSc:                  Audry Pili

## 2020-01-03 NOTE — Care Management Important Message (Signed)
Important Message  Patient Details IM Letter given to Roque Lias SW Case Manager to present to the Patient Name: Manuel Olson MRN: [id number] Date of Birth: Dec 23, 1945   Medicare Important Message Given:  Yes     Kerin Salen 01/03/2020, 11:28 AM

## 2020-01-04 LAB — CBC
HCT: 28.8 % — ABNORMAL LOW (ref 39.0–52.0)
Hemoglobin: 9.2 g/dL — ABNORMAL LOW (ref 13.0–17.0)
MCH: 30.6 pg (ref 26.0–34.0)
MCHC: 31.9 g/dL (ref 30.0–36.0)
MCV: 95.7 fL (ref 80.0–100.0)
Platelets: 123 10*3/uL — ABNORMAL LOW (ref 150–400)
RBC: 3.01 MIL/uL — ABNORMAL LOW (ref 4.22–5.81)
RDW: 14.9 % (ref 11.5–15.5)
WBC: 9.1 10*3/uL (ref 4.0–10.5)
nRBC: 0 % (ref 0.0–0.2)

## 2020-01-04 LAB — GLUCOSE, CAPILLARY
Glucose-Capillary: 119 mg/dL — ABNORMAL HIGH (ref 70–99)
Glucose-Capillary: 137 mg/dL — ABNORMAL HIGH (ref 70–99)
Glucose-Capillary: 145 mg/dL — ABNORMAL HIGH (ref 70–99)
Glucose-Capillary: 154 mg/dL — ABNORMAL HIGH (ref 70–99)
Glucose-Capillary: 169 mg/dL — ABNORMAL HIGH (ref 70–99)
Glucose-Capillary: 219 mg/dL — ABNORMAL HIGH (ref 70–99)
Glucose-Capillary: 293 mg/dL — ABNORMAL HIGH (ref 70–99)

## 2020-01-04 LAB — HEMOGLOBIN: Hemoglobin: 9 g/dL — ABNORMAL LOW (ref 13.0–17.0)

## 2020-01-04 MED ORDER — SODIUM CHLORIDE 0.9 % IV SOLN
510.0000 mg | Freq: Once | INTRAVENOUS | Status: AC
  Administered 2020-01-04: 510 mg via INTRAVENOUS
  Filled 2020-01-04: qty 510

## 2020-01-04 MED ORDER — CLOPIDOGREL BISULFATE 75 MG PO TABS: ORAL_TABLET | ORAL | 1 refills | Status: DC

## 2020-01-04 MED ORDER — ONDANSETRON HCL 4 MG PO TABS: 4.0000 mg | ORAL_TABLET | Freq: Four times a day (QID) | ORAL | 0 refills | Status: DC | PRN

## 2020-01-04 MED ORDER — PANTOPRAZOLE SODIUM 40 MG PO TBEC
40.0000 mg | DELAYED_RELEASE_TABLET | Freq: Two times a day (BID) | ORAL | 1 refills | Status: DC
Start: 2020-01-04 — End: 2020-05-06

## 2020-01-04 NOTE — Progress Notes (Signed)
Hypoglycemic Event  CBG: 63  Treatment: 8 oz juice/soda  Symptoms: None  Follow-up CBG: Time:0053 CBG Result:145  Possible Reasons for Event: Unknown  Comments/MD notified: n/a    Reynold Bowen

## 2020-01-04 NOTE — Progress Notes (Signed)
     Progress Note    ASSESSMENT AND PLAN:   1. UGI bleed -actively bleeding anastomotic ulcer s/p endoscopic treatment (epi/Bicap/clips) after ASA initiation. No rebleeding. Hb stable. 2. H/O Panc Ca s/p Whipple's.  Plan: -Check Hb around 2 PM today.  If stable, D/C home with GI FU in 3-4 weeks. -Continue Protonix 40 mg p.o. BID. -Avoid NSAIDs including ASA for now -Resume Plavix in 7 days. -Check stool for H. pylori antigen (if cannot be done before D/C, will get it done at FU) -Will benefit from iron therapy at D/C (may turn stool dark). -Recommend repeat EGD in 6-8 weeks. -We will sign off for now.  Please call with any ?[name] -D/W Dr. Zigmund Daniel.    SUBJECTIVE   Feels much better this morning No further bleeding Tolerating low-fat diet well. No abdominal pain. Would like to go home.    OBJECTIVE:     Vital signs in last 24 hours: Temp:  [97.9 F (36.6 C)-99.2 F (37.3 C)] 98.4 F (36.9 C) (04/24 0406) Pulse Rate:  [69-74] 73 (04/24 0406) Resp:  [17-18] 17 (04/24 0406) BP: (123-129)/(61-75) 125/70 (04/24 0406) SpO2:  [95 %-100 %] 95 % (04/24 0406) Weight:  [60.6 kg] 60.6 kg (04/24 0406) Last BM Date: 01/01/20 General:   Alert,  NAD EENT:  Very HOH, non icteric sclera, conjunctive pink.  Abdomen:  Soft, nondistended, nontender.  Normal bowel sounds,.       Neurologic:  Alert and  oriented x4;  grossly normal neurologically. Psych:  Pleasant, cooperative.  Normal mood and affect.   Intake/Output from previous day: No intake/output data recorded. Intake/Output this shift: No intake/output data recorded.  Lab Results: Recent Labs    01/03/20 0419 01/03/20 0419 01/03/20 1038 01/03/20 1546 01/04/20 0404  WBC 8.9  --  10.2 10.1  --   HGB 9.0*   < > 9.1* 9.1* 9.0*  HCT 27.5*  --  28.5* 28.2*  --   PLT 103*  --  106* 109*  --    < > = values in this interval not displayed.   BMET Recent Labs    01/01/20 1858  NA 136  K 4.4  CL 107  CO2 21*    GLUCOSE 146*  BUN 54*  CREATININE 1.85*  CALCIUM 7.9*   LFT Recent Labs    01/01/20 1858  PROT 4.7*  ALBUMIN 2.8*  AST 23  ALT 19  ALKPHOS 58  BILITOT 1.4*   PT/INR No results for input(s): LABPROT, INR in the last 72 hours. Hepatitis Panel No results for input(s): HEPBSAG, HCVAB, HEPAIGM, HEPBIGM in the last 72 hours.  No results found.   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

## 2020-01-04 NOTE — Discharge Summary (Addendum)
Physician Discharge Summary  BUDD FREIERMUTH QBH:[id number] DOB: [birth year] DOA: 12/30/2019  PCP: [name], MD  Admit date: 12/30/2019 Discharge date:01/05/20 Admitted From: home Disposition: home Recommendations for Outpatient Follow-up:  1. Follow up with PCP in 1-2 weeks 2. Please obtain BMP/CBC in one week 3. Follow up with gi   Home Health:none Equipment/Devicesnone Discharge Conditionstable CODE STATUSfull Diet recommendation: cardiac Brief/Interim Summary:74 y.o.malewithhistory of CAD status post stenting, status post TAVR, hypertension diabetes mellitus, pancreatic cancer status post Whipple's procedure, nephrectomy for renal cell cancer presents to the ER the patient was feeling weak and dizzy and hypotensive at home. At the home patient also had a large amount of black tarry stool. Denies taking any NSAIDs. Patient is on aspirin and Plavix CAD. Over the last 1 week patient has been having some abdominal discomfort mostly epigastric area denies any vomiting.  ED Course:In the ER blood work show hemoglobin of 5.2 which dropped from 9.1 about 4 days ago. Stool for occult blood was positive and melanotic. ER physician discussed with on-call of our gastroenterologist who advised transfusion and placing patient on Protonix and will be seeing patient in consult for EGD. On exam patient abdomen appears benign. EKG shows normal sinus rhythm. Labs show acute on chronic kidney disease stage III with creatinine around 1.8 which is worsened from 1.5 few months ago. High-sensitivity troponin was negative Covid test was negative.    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  Status post epi and Endo Clip placement.  Patient received 4 units of blood transfusion. Aspirin and Plavix were on hold.  He is advised to restart the Plavix on Jan 11, 2020.  And continue not taking the aspirin until seen by GI as an outpatient.   #2 history of CAD/stent December 2020 on Plavix at home.  Restart Plavix Jan 11, 2020.  #3 history of pancreatic cancer status post Whipple's procedure  #4 status post nephrectomy for renal cell carcinoma  #5 status post TAVR  #6 AKI on CKD stage IIIstable  #7 type 2 diabetes continue home meds  Estimated body mass index is 19.17 kg/m as calculated from the following:   Height as of this encounter: 5' 10"  (1.778 m).   Weight as of this encounter: 60.6 kg.  Discharge Instructions   Allergies as of 01/04/2020   No Known Allergies     Medication List    STOP taking these medications   aspirin 81 MG chewable tablet   nitroGLYCERIN 0.4 MG SL tablet Commonly known as: NITROSTAT     TAKE these medications   atorvastatin 20 MG tablet Commonly known as: LIPITOR TAKE 1 TABLET BY MOUTH ONCE DAILY.   Basaglar KwikPen 100 UNIT/ML Inject 0.1 mLs (10 Units total) into the skin at bedtime. What changed:   how much to take  when to take this   clopidogrel 75 MG tablet Commonly known as: PLAVIX Restart Plavix on Jan 11, 2020 What changed:   how much to take  how to take this  when to take this  additional instructions   Creon 36000 UNITS Cpep capsule Generic  drug: lipase/protease/amylase TAKE 1 CAPSULE BY MOUTH BEFORE EACH MEAL AND SNACK. TAKE UP TO 6 CAPSULES PER DAY. What changed: See the new instructions.   insulin lispro 100 UNIT/ML KwikPen Commonly known as: HumaLOG KwikPen Inject 3-5 units 3 times a day with meals. What changed:   how much to take  how to take this  when to take this  additional instructions   ondansetron 4 MG  tablet Commonly known as: ZOFRAN Take 1 tablet (4 mg total) by mouth every 6 (six) hours as needed for nausea.   pantoprazole 40 MG tablet Commonly known as: Protonix Take 1 tablet (40 mg total) by mouth 2 (two) times daily.   Pen Needles 31G X 6 MM Misc Use as directed to check blood sugar 4 times a day.      Follow-up Information    Zehr, Laban Emperor, PA-C Follow up on 01/17/2020.   Specialty: Gastroenterology Why: 11:00 am Contact information: [place] [address] [phone]          No Known Allergies  Consultations:  gi   Procedures/Studies: DG Chest 2 View  Result Date: 12/30/2019 CLINICAL DATA:  Dizziness EXAM: CHEST - 2 VIEW COMPARISON:  08/22/2019 FINDINGS: Aortic valve stent endograft. Both lungs are clear. Disc degenerative disease and ankylosis of the thoracic spine. IMPRESSION: No acute abnormality of the lungs.  Aortic valve stent endograft. Electronically Signed   By: Eddie Candle M.D.   On: 12/30/2019 20:35   CT RENAL STONE STUDY  Result Date: 12/31/2019 CLINICAL DATA:  Flank pain. Dizziness and weakness. Where a electronic medical record abscess history of renal cell carcinoma and pancreatic cancer. Post Whipple procedure and left nephrectomy. EXAM: CT ABDOMEN AND PELVIS WITHOUT CONTRAST TECHNIQUE: Multidetector CT imaging of the abdomen and pelvis was performed following the standard protocol without IV contrast. COMPARISON:  Most recent CT 08/05/2019. Contrast-enhanced exam 12/25/2017 FINDINGS: Lower chest: Heart is normal in size. Coronary artery calcifications. Emphysema. Stable peribronchovascular nodularity in the left lower lobe consistent with post infectious or inflammatory scarring. Hepatobiliary: Pneumobilia in the left lobe, likely postprocedural. No evidence of focal hepatic lesion on noncontrast exam. Clips in the gallbladder fossa postcholecystectomy. No biliary dilatation. Pancreas: Not well-defined on the current exam given lack  of contrast and paucity of intra-abdominal fat. There are surgical clips in the expected location of the pancreatic head. Spleen: Normal in size. No evidence of focal abnormality. Adrenals/Urinary Tract: Post left nephrectomy. Left adrenal gland is not visualized. Normal right adrenal gland. Nonobstructing stone in the lower right kidney. Small low-attenuation lesion in the posterior right kidney is too small to characterize but likely small cyst. No right hydronephrosis. Urinary bladder distended. No bladder wall thickening. Possible punctate stone in the dependent bladder, series 2, image 70. Stomach/Bowel: Bowel evaluation is limited in the absence of contrast and paucity of intra-abdominal fat. Postsurgical change involving the stomach and small bowel. No small bowel obstruction or evidence of inflammation. Fecalization of distal small bowel contents. Appendix not visualized, no evidence of appendicitis. Small to moderate volume of stool in the colon. Descending and sigmoid diverticulosis without diverticulitis. No colonic inflammation. Vascular/Lymphatic: Advanced aorta bi-iliac atherosclerosis. Fusiform ectasia of the infrarenal aorta maximal dimension 3.0 cm aneurysmal dilatation of the right common iliac artery measuring 2.2 cm proximally and 3.4 cm distally, unchanged. No adjacent stranding. Aneurysm at the internal external bifurcation at 2.4 cm. There is branch vessel atherosclerosis. No definite enlarged abdominopelvic lymph nodes allowing for limited limitations related to  lack of contrast and paucity of body fat. Reproductive: Prominent prostate gland of 4.9 cm. Other: Stable focal rounded haziness in the right omentum, series 2, image 33. tiny adjacent nodule measuring 5 mm, series 2, image 32. No ascites. No free air. Musculoskeletal: Chronic L2 compression fracture. Bones are diffusely under mineralized. Multilevel degenerative change in the spine. No evidence of focal bone lesion. IMPRESSION: 1.  Distended urinary bladder with possible punctate stone dependently. Nonobstructing stone in the right kidney. No hydronephrosis or obstructive uropathy. 2. Colonic diverticulosis without diverticulitis. 3. Stable focal rounded haziness in the right omentum since 08/05/2019 CT. There is a tiny 5 mm adjacent soft tissue nodule, that is nonspecific, better seen on the current exam. Recommend attention on follow-up. 4. Post left nephrectomy. Post reported Whipple procedure. Pneumobilia is likely postprocedural. 5. Aortic and right iliac aneurysms, not significantly changed from prior exam. Aortic aneurysm up to 3 cm, with right iliac aneurysm up to 3.4 cm. Recommend followup by ultrasound in 3 years. This recommendation follows ACR consensus guidelines: White Paper of the ACR Incidental Findings Committee II on Vascular Findings. J Am Coll Radiol 2013; 10:789-794. Aortic aneurysm NOS (ICD10-I71.9) Aortic Atherosclerosis (ICD10-I70.0) Electronically Signed   By: Keith Rake M.D.   On: 12/31/2019 00:12    (Echo, Carotid, EGD, Colonoscopy, ERCP)    Subjective:no complaints    Discharge Exam: Vitals:   01/03/20 2046 01/04/20 0406  BP: 123/61 125/70  Pulse: 74 73  Resp: 18 17  Temp: 99.2 F (37.3 C) 98.4 F (36.9 C)  SpO2: 97% 95%   Vitals:   01/03/20 0431 01/03/20 1419 01/03/20 2046 01/04/20 0406  BP: 121/66 129/75 123/61 125/70  Pulse: 72 69 74 73  Resp: 20  18 17   Temp: 98.1 F (36.7 C) 97.9 F (36.6 C) 99.2 F (37.3 C) 98.4 F (36.9 C)  TempSrc:  Oral Oral Oral  SpO2: 96% 100% 97% 95%  Weight:    60.6 kg  Height:        General: Pt is alert, awake, not in acute distress Cardiovascular: RRR, S1/S2 +, no rubs, no gallops Respiratory: CTA bilaterally, no wheezing, no rhonchi Abdominal: Soft, NT, ND, bowel sounds + Extremities: no edema, no cyanosis    The results of significant diagnostics from this hospitalization (including imaging, microbiology, ancillary and laboratory)  are listed below for reference.     Microbiology: Recent Results (from the past 240 hour(s))  Respiratory Panel by RT PCR (Flu A&B, Covid) - Nasopharyngeal Swab     Status: None   Collection Time: 12/30/19  9:42 PM   Specimen: Nasopharyngeal Swab  Result Value Ref Range Status   SARS Coronavirus 2 by RT PCR NEGATIVE NEGATIVE Final    Comment: (NOTE) SARS-CoV-2 target nucleic acids are NOT DETECTED. The SARS-CoV-2 RNA is generally detectable in upper respiratoy specimens during the acute phase of infection. The lowest concentration of SARS-CoV-2 viral copies this assay can detect is 131 copies/mL. A negative result does not preclude SARS-Cov-2 infection and should not be used as the sole basis for treatment or other patient management decisions. A negative result may occur with  improper specimen collection/handling, submission of specimen other than nasopharyngeal swab, presence of viral mutation(s) within the areas targeted by this assay, and inadequate number of viral copies (<131 copies/mL). A negative result must be combined with clinical observations, patient history, and epidemiological information. The expected result is Negative. Fact Sheet for Patients:  PinkCheek.be Fact Sheet for Healthcare Providers:  GravelBags.it This test  is not yet ap proved or cleared by the [place] and  has been authorized for detection and/or diagnosis of SARS-CoV-2 by FDA under an Emergency Use Authorization (EUA). This EUA will remain  in effect (meaning this test can be used) for the duration of the COVID-19 declaration under Section 564(b)(1) of the Act, 21 U.S.C. section 360bbb-3(b)(1), unless the authorization is terminated or revoked sooner.    Influenza A by PCR NEGATIVE NEGATIVE Final   Influenza B by PCR NEGATIVE NEGATIVE Final    Comment: (NOTE) The Xpert Xpress SARS-CoV-2/FLU/RSV assay is intended as an aid in   the diagnosis of influenza from Nasopharyngeal swab specimens and  should not be used as a sole basis for treatment. Nasal washings and  aspirates are unacceptable for Xpert Xpress SARS-CoV-2/FLU/RSV  testing. Fact Sheet for Patients: PinkCheek.be Fact Sheet for Healthcare Providers: GravelBags.it This test is not yet approved or cleared by the [place] FDA and  has been authorized for detection and/or diagnosis of SARS-CoV-2 by  FDA under an Emergency Use Authorization (EUA). This EUA will remain  in effect (meaning this test can be used) for the duration of the  Covid-19 declaration under Section 564(b)(1) of the Act, 21  U.S.C. section 360bbb-3(b)(1), unless the authorization is  terminated or revoked. Performed at [facility], [place] [address], [place], [place] [address]      Labs: BNP (last 3 results) No results for input(s): BNP in the last 8760 hours. Basic Metabolic Panel: Recent Labs  Lab 12/30/19 1925 12/30/19 1937 12/31/19 0814 01/01/20 1858  NA 130* 131* 136 136  K 5.4* 5.3* 4.3 4.4  CL 101 101 110 107  CO2 19*  --  19* 21*  GLUCOSE 212* 205* 175* 146*  BUN 56* 54* 55* 54*  CREATININE 1.87* 1.80* 1.59* 1.85*  CALCIUM 8.1*  --  7.2* 7.9*  MG  --   --   --  2.0   Liver Function Tests: Recent Labs  Lab 12/30/19 1925 01/01/20 1858  AST 18 23  ALT 18 19  ALKPHOS 70 58  BILITOT 0.5 1.4*  PROT 5.1* 4.7*  ALBUMIN 3.0* 2.8*   No results for input(s): LIPASE, AMYLASE in the last 168 hours. No results for input(s): AMMONIA in the last 168 hours. CBC: Recent Labs  Lab 12/30/19 1925 12/30/19 1937 01/02/20 1604 01/02/20 1604 01/02/20 2321 01/03/20 0419 01/03/20 1038 01/03/20 1546 01/04/20 0404  WBC 12.2*   < > 14.4*  --  10.5 8.9 10.2 10.1  --   NEUTROABS 9.7*  --   --   --   --   --   --   --   --   HGB 5.2*   < > 10.4*   < > 9.1* 9.0* 9.1* 9.1* 9.0*  HCT 17.4*   < >  31.7*  --  27.2* 27.5* 28.5* 28.2*  --   MCV 100.6*   < > 93.8  --  92.5 92.9 96.0 94.6  --   PLT 180   < > 107*  --  100* 103* 106* 109*  --    < > = values in this interval not displayed.   Cardiac Enzymes: No results for input(s): CKTOTAL, CKMB, CKMBINDEX, TROPONINI in the last 168 hours. BNP: Invalid input(s): POCBNP CBG: Recent Labs  Lab 01/03/20 2044 01/03/20 2358 01/04/20 0053 01/04/20 0404 01/04/20 0827  GLUCAP 267* 63* 145* 154* 137*   D-Dimer No results for input(s): DDIMER in the last  72 hours. Hgb A1c No results for input(s): HGBA1C in the last 72 hours. Lipid Profile No results for input(s): CHOL, HDL, LDLCALC, TRIG, CHOLHDL, LDLDIRECT in the last 72 hours. Thyroid function studies No results for input(s): TSH, T4TOTAL, T3FREE, THYROIDAB in the last 72 hours.  Invalid input(s): FREET3 Anemia work up No results for input(s): VITAMINB12, FOLATE, FERRITIN, TIBC, IRON, RETICCTPCT in the last 72 hours. Urinalysis    Component Value Date/Time   COLORURINE YELLOW 12/30/2019 1925   APPEARANCEUR CLEAR 12/30/2019 1925   LABSPEC 1.014 12/30/2019 1925   PHURINE 5.0 12/30/2019 1925   GLUCOSEU NEGATIVE 12/30/2019 1925   HGBUR NEGATIVE 12/30/2019 [place] NEGATIVE 12/30/2019 1925   KETONESUR 5 (A) 12/30/2019 1925   PROTEINUR NEGATIVE 12/30/2019 1925   NITRITE NEGATIVE 12/30/2019 1925   LEUKOCYTESUR TRACE (A) 12/30/2019 1925   Sepsis Labs Invalid input(s): PROCALCITONIN,  WBC,  LACTICIDVEN Microbiology Recent Results (from the past 240 hour(s))  Respiratory Panel by RT PCR (Flu A&B, Covid) - Nasopharyngeal Swab     Status: None   Collection Time: 12/30/19  9:42 PM   Specimen: Nasopharyngeal Swab  Result Value Ref Range Status   SARS Coronavirus 2 by RT PCR NEGATIVE NEGATIVE Final    Comment: (NOTE) SARS-CoV-2 target nucleic acids are NOT DETECTED. The SARS-CoV-2 RNA is generally detectable in upper respiratoy specimens during the acute phase of  infection. The lowest concentration of SARS-CoV-2 viral copies this assay can detect is 131 copies/mL. A negative result does not preclude SARS-Cov-2 infection and should not be used as the sole basis for treatment or other patient management decisions. A negative result may occur with  improper specimen collection/handling, submission of specimen other than nasopharyngeal swab, presence of viral mutation(s) within the areas targeted by this assay, and inadequate number of viral copies (<131 copies/mL). A negative result must be combined with clinical observations, patient history, and epidemiological information. The expected result is Negative. Fact Sheet for Patients:  PinkCheek.be Fact Sheet for Healthcare Providers:  GravelBags.it This test is not yet ap proved or cleared by the [place] FDA and  has been authorized for detection and/or diagnosis of SARS-CoV-2 by FDA under an Emergency Use Authorization (EUA). This EUA will remain  in effect (meaning this test can be used) for the duration of the COVID-19 declaration under Section 564(b)(1) of the Act, 21 U.S.C. section 360bbb-3(b)(1), unless the authorization is terminated or revoked sooner.    Influenza A by PCR NEGATIVE NEGATIVE Final   Influenza B by PCR NEGATIVE NEGATIVE Final    Comment: (NOTE) The Xpert Xpress SARS-CoV-2/FLU/RSV assay is intended as an aid in  the diagnosis of influenza from Nasopharyngeal swab specimens and  should not be used as a sole basis for treatment. Nasal washings and  aspirates are unacceptable for Xpert Xpress SARS-CoV-2/FLU/RSV  testing. Fact Sheet for Patients: PinkCheek.be Fact Sheet for Healthcare Providers: GravelBags.it This test is not yet approved or cleared by the [place] FDA and  has been authorized for detection and/or diagnosis of SARS-CoV-2 by  FDA under  an Emergency Use Authorization (EUA). This EUA will remain  in effect (meaning this test can be used) for the duration of the  Covid-19 declaration under Section 564(b)(1) of the Act, 21  U.S.C. section 360bbb-3(b)(1), unless the authorization is  terminated or revoked. Performed at [facility], [place] [address], [place], [place] [address]      Time coordinating discharge: 39 minutes  SIGNED:   Noland Fordyce  Rodena Piety, MD  [facility] 01/04/2020, 9:56 AM Pager   If 7PM-7AM, please contact night-coverage [email or web address] Password [email or web address]

## 2020-01-05 LAB — CBC
HCT: 28.7 % — ABNORMAL LOW (ref 39.0–52.0)
Hemoglobin: 9.2 g/dL — ABNORMAL LOW (ref 13.0–17.0)
MCH: 30.4 pg (ref 26.0–34.0)
MCHC: 32.1 g/dL (ref 30.0–36.0)
MCV: 94.7 fL (ref 80.0–100.0)
Platelets: 120 10*3/uL — ABNORMAL LOW (ref 150–400)
RBC: 3.03 MIL/uL — ABNORMAL LOW (ref 4.22–5.81)
RDW: 14.6 % (ref 11.5–15.5)
WBC: 7.4 10*3/uL (ref 4.0–10.5)
nRBC: 0 % (ref 0.0–0.2)

## 2020-01-05 LAB — GLUCOSE, CAPILLARY
Glucose-Capillary: 175 mg/dL — ABNORMAL HIGH (ref 70–99)
Glucose-Capillary: 131 mg/dL — ABNORMAL HIGH (ref 70–99)

## 2020-01-05 NOTE — Progress Notes (Signed)
Pt leaving this morning with his spouse. Alert and oriented. Pt is without c/o. Discharge instructions given/explained with pt and his spouse verbalizing understanding. Pt aware to followup.

## 2020-01-08 LAB — H. PYLORI ANTIGEN, STOOL: H. Pylori Stool Ag, Eia: NEGATIVE

## 2020-01-09 ENCOUNTER — Other Ambulatory Visit: Payer: Self-pay

## 2020-01-09 ENCOUNTER — Encounter: Payer: Self-pay | Admitting: Nurse Practitioner

## 2020-01-09 ENCOUNTER — Inpatient Hospital Stay: Payer: Medicare Other

## 2020-01-09 ENCOUNTER — Inpatient Hospital Stay (HOSPITAL_BASED_OUTPATIENT_CLINIC_OR_DEPARTMENT_OTHER): Payer: Medicare Other | Admitting: Nurse Practitioner

## 2020-01-09 ENCOUNTER — Telehealth: Payer: Self-pay | Admitting: Nurse Practitioner

## 2020-01-09 VITALS — BP 111/61 | HR 81 | Temp 98.7°F | Resp 17 | Ht 70.0 in | Wt 133.4 lb

## 2020-01-09 DIAGNOSIS — I252 Old myocardial infarction: Secondary | ICD-10-CM | POA: Diagnosis not present

## 2020-01-09 DIAGNOSIS — D500 Iron deficiency anemia secondary to blood loss (chronic): Secondary | ICD-10-CM | POA: Diagnosis not present

## 2020-01-09 DIAGNOSIS — K284 Chronic or unspecified gastrojejunal ulcer with hemorrhage: Secondary | ICD-10-CM | POA: Diagnosis not present

## 2020-01-09 DIAGNOSIS — Z905 Acquired absence of kidney: Secondary | ICD-10-CM | POA: Diagnosis not present

## 2020-01-09 DIAGNOSIS — D696 Thrombocytopenia, unspecified: Secondary | ICD-10-CM | POA: Diagnosis not present

## 2020-01-09 DIAGNOSIS — Z8507 Personal history of malignant neoplasm of pancreas: Secondary | ICD-10-CM | POA: Diagnosis not present

## 2020-01-09 DIAGNOSIS — I25119 Atherosclerotic heart disease of native coronary artery with unspecified angina pectoris: Secondary | ICD-10-CM

## 2020-01-09 DIAGNOSIS — E119 Type 2 diabetes mellitus without complications: Secondary | ICD-10-CM | POA: Diagnosis not present

## 2020-01-09 DIAGNOSIS — I350 Nonrheumatic aortic (valve) stenosis: Secondary | ICD-10-CM | POA: Diagnosis not present

## 2020-01-09 DIAGNOSIS — C250 Malignant neoplasm of head of pancreas: Secondary | ICD-10-CM | POA: Diagnosis not present

## 2020-01-09 DIAGNOSIS — N289 Disorder of kidney and ureter, unspecified: Secondary | ICD-10-CM | POA: Diagnosis not present

## 2020-01-09 DIAGNOSIS — I10 Essential (primary) hypertension: Secondary | ICD-10-CM | POA: Diagnosis not present

## 2020-01-09 DIAGNOSIS — I2510 Atherosclerotic heart disease of native coronary artery without angina pectoris: Secondary | ICD-10-CM | POA: Diagnosis not present

## 2020-01-09 DIAGNOSIS — Z87891 Personal history of nicotine dependence: Secondary | ICD-10-CM | POA: Diagnosis not present

## 2020-01-09 LAB — CBC WITH DIFFERENTIAL (CANCER CENTER ONLY)
Abs Immature Granulocytes: 0.05 10*3/uL (ref 0.00–0.07)
Basophils Absolute: 0 10*3/uL (ref 0.0–0.1)
Basophils Relative: 0 %
Eosinophils Absolute: 0.2 10*3/uL (ref 0.0–0.5)
Eosinophils Relative: 2 %
HCT: 30.3 % — ABNORMAL LOW (ref 39.0–52.0)
Hemoglobin: 9.5 g/dL — ABNORMAL LOW (ref 13.0–17.0)
Immature Granulocytes: 1 %
Lymphocytes Relative: 14 %
Lymphs Abs: 1.1 10*3/uL (ref 0.7–4.0)
MCH: 30.3 pg (ref 26.0–34.0)
MCHC: 31.4 g/dL (ref 30.0–36.0)
MCV: 96.5 fL (ref 80.0–100.0)
Monocytes Absolute: 1 10*3/uL (ref 0.1–1.0)
Monocytes Relative: 12 %
Neutro Abs: 5.6 10*3/uL (ref 1.7–7.7)
Neutrophils Relative %: 71 %
Platelet Count: 199 10*3/uL (ref 150–400)
RBC: 3.14 MIL/uL — ABNORMAL LOW (ref 4.22–5.81)
RDW: 15 % (ref 11.5–15.5)
WBC Count: 7.8 10*3/uL (ref 4.0–10.5)
nRBC: 0 % (ref 0.0–0.2)

## 2020-01-09 LAB — SAMPLE TO BLOOD BANK

## 2020-01-09 LAB — CMP (CANCER CENTER ONLY)
ALT: 14 U/L (ref 0–44)
AST: 16 U/L (ref 15–41)
Albumin: 2.8 g/dL — ABNORMAL LOW (ref 3.5–5.0)
Alkaline Phosphatase: 100 U/L (ref 38–126)
Anion gap: 6 (ref 5–15)
BUN: 28 mg/dL — ABNORMAL HIGH (ref 8–23)
CO2: 27 mmol/L (ref 22–32)
Calcium: 8.5 mg/dL — ABNORMAL LOW (ref 8.9–10.3)
Chloride: 104 mmol/L (ref 98–111)
Creatinine: 1.67 mg/dL — ABNORMAL HIGH (ref 0.61–1.24)
GFR, Est AFR Am: 45 mL/min — ABNORMAL LOW (ref >60)
GFR, Estimated: 39 mL/min — ABNORMAL LOW (ref >60)
Glucose, Bld: 123 mg/dL — ABNORMAL HIGH (ref 70–99)
Potassium: 4.4 mmol/L (ref 3.5–5.1)
Sodium: 137 mmol/L (ref 135–145)
Total Bilirubin: 0.2 mg/dL — ABNORMAL LOW (ref 0.3–1.2)
Total Protein: 5.6 g/dL — ABNORMAL LOW (ref 6.5–8.1)

## 2020-01-09 LAB — FERRITIN: Ferritin: 491 ng/mL — ABNORMAL HIGH (ref 24–336)

## 2020-01-09 NOTE — Progress Notes (Addendum)
[facility] OFFICE PROGRESS NOTE   Diagnosis: Pancreas cancer  INTERVAL HISTORY:   Mr. Olson returns as scheduled.  He was hospitalized 12/30/2019 through 01/05/2020 presenting with weakness, dizziness and hypotension.  He was found to have severe anemia with stool positive for blood.  Upper endoscopy on 01/02/2020 showed an actively bleeding anastomotic ulcer status post endoscopic treatment.  He is feeling much better since discharge from the hospital.  No black stools.  No diarrhea.  No nausea or vomiting.  He reports an area of superficial phlebitis of the right arm is improving.  Objective:  Vital signs in last 24 hours:  Blood pressure 111/61, pulse 81, temperature 98.7 F (37.1 C), temperature source Temporal, resp. rate 17, height 5' 10"  (1.778 m), weight 133 lb 6.4 oz (60.5 kg), SpO2 99 %.    Resp: Lungs clear bilaterally. Cardio: Regular rate and rhythm. GI: Abdomen soft and nontender.  No hepatomegaly. Vascular: No leg edema.  Superficial phlebitis right antecubital region extending to the upper arm, associated tenderness and mild erythema. Skin: Warm and dry.   Lab Results:  Lab Results  Component Value Date   WBC 7.8 01/09/2020   HGB 9.5 (L) 01/09/2020   HCT 30.3 (L) 01/09/2020   MCV 96.5 01/09/2020   PLT 199 01/09/2020   NEUTROABS 5.6 01/09/2020    Imaging:  No results found.  Medications: I have reviewed the patient's current medications.  Assessment/Plan: 1. Adenocarcinoma of the pancreas head/neck, status post an EUS biopsy 08/02/2017 confirming adenocarcinoma ? EUS 08/02/2017-pancreas head/neck mass, abutment of the portal vein, no peripancreatic adenopathy ? MRI abdomen 07/27/2017-head/neck pancreas mass with mass-effect on the portal splenic confluence, no involvement of the celiac axis or superior mesenteric artery, no evidence of metastatic disease ? PET scan 08/31/2017-very hypermetabolism at the pancreatic head mass, no  evidence of metastatic disease. The isolated right lower lobe nodule below PET resolution, left sided renal mass with decreased activity relative to the normal adjacent kidney ? Cycle 1 FOLFIRINOX 09/16/2017 ? Cycle 2 FOLFOX 10/16/2017 ? Cycle 3 FOLFOX 10/30/2017 ? Cycle 4 FOLFIRINOX 11/14/2017 (irinotecan resumed with a dose reduction) ? Cycle 5 FOLFIRINOX3/20/2019 (Oxaliplatin dose reduced due to thrombocytopenia) ? Cycle 6 FOLFIRINOX4/11/2017 ? CTs 12/25/2017-stable right lower lobe nodule, decrease in size of the pancreas head mass, stable left kidney mass ? Pancreaticoduodenectomy and left nephrectomy 02/19/2018,[id number], 2/18 lymph nodes positive, lymphovascular and perineural invasion present, mild to moderate treatment effect, tumor involved adherent portal vein with negative portal vein margins ? Cycle 1 adjuvant gemcitabine/Abraxane 04/25/2018 ? Cycle 2 adjuvant gemcitabine/Abraxane 05/16/2018 ? Cycle 3 adjuvant gemcitabine/Abraxane 05/31/2018 ? Cycle 4 adjuvant gemcitabine/Abraxane 06/13/2018 ? Cycle 5 adjuvant gemcitabine/Abraxane 06/27/2018 ? Cycle 6 adjuvant gemcitabine/Abraxane 07/11/2018  2. 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  5.Diabetes  6.History of tobacco use  7.Hypertension  8.Port-A-Cath placement 09/15/2017, Dr. Barry Dienes  9. Diarrhea-potentially related to pancreas insufficiency, he began a trial of pancreatic enzyme replacement 05/16/2018: Diarrhea resolved  10. Dehydrationon 03/19/2018-statustreatments with IV fluidsforpast 2 weeks  Admission 04/03/2018 with dehydration and  hypotension  Persistent orthostatic hypotension-compression stockings and Florinef added 7/30/2019improved, Florinef discontinued  11. 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  Hospitalization with GI bleed 12/30/2019 through 01/05/2020.  Upper endoscopy on 01/02/2020 showed an actively bleeding anastomotic ulcer.   Disposition: [facility] Olson appears stable.  He was recently admitted with severe anemia, GI bleed.  He was found to have an actively bleeding anastomotic ulcer.  He received blood transfusions and IV iron.  We reviewed the CBC from today.  Hemoglobin is stable as compared to hospital discharge.  He will begin ferrous sulfate 325 mg twice daily.  He has a follow-up appointment with gastroenterology on 01/21/2020.  He will return for a CBC and follow-up visit here in 3 weeks.  Patient seen with [facility] Olson.  [facility] Olson ANP/GNP-BC   01/09/2020  2:52 PM  This was a shared visit with [facility] Olson.  [facility] Olson was admitted with severe symptomatic anemia secondary to a bleeding anastomotic ulcer.  The hemoglobin is stable.  He will begin iron therapy.  He is scheduled to see gastroenterology within the next few weeks.  We recommended he continue pantoprazole.  [facility] Manson, [facility] Olson

## 2020-01-09 NOTE — Telephone Encounter (Signed)
Scheduled appt per 4/29 los.  Printed calendar and avs.

## 2020-01-09 NOTE — Patient Instructions (Signed)
Begin ferrous sulfate 325 mg twice daily

## 2020-01-13 ENCOUNTER — Other Ambulatory Visit: Payer: Self-pay

## 2020-01-13 NOTE — Patient Outreach (Signed)
[place] [facility]) Care Management  01/13/2020  MUHANAD TOROSYAN 1946/04/17 [id number]   Red Emmi: Date of Red emmi: 01/10/2020 Reason for alert:  Feeling sad, hopeless and empty---yes  Placed call to patient and spoke with wife, who states she answered the call. Reports she answered the question as no but it was recorded as yes.  Wife denies any problems or concerns. Noted that patient has already been for hospital follow up.  PLAN: close case as no needs identified.  Tomasa Rand, RN, BSN, CEN [facility] [employer] [phone]

## 2020-01-16 ENCOUNTER — Encounter: Payer: Self-pay | Admitting: Internal Medicine

## 2020-01-16 ENCOUNTER — Other Ambulatory Visit: Payer: Self-pay

## 2020-01-16 ENCOUNTER — Ambulatory Visit (INDEPENDENT_AMBULATORY_CARE_PROVIDER_SITE_OTHER): Payer: Medicare Other | Admitting: Internal Medicine

## 2020-01-16 VITALS — BP 138/68 | HR 75 | Ht 70.0 in | Wt 132.0 lb

## 2020-01-16 DIAGNOSIS — E1165 Type 2 diabetes mellitus with hyperglycemia: Secondary | ICD-10-CM | POA: Diagnosis not present

## 2020-01-16 DIAGNOSIS — I25119 Atherosclerotic heart disease of native coronary artery with unspecified angina pectoris: Secondary | ICD-10-CM

## 2020-01-16 DIAGNOSIS — E1159 Type 2 diabetes mellitus with other circulatory complications: Secondary | ICD-10-CM

## 2020-01-16 DIAGNOSIS — E782 Mixed hyperlipidemia: Secondary | ICD-10-CM

## 2020-01-16 DIAGNOSIS — E46 Unspecified protein-calorie malnutrition: Secondary | ICD-10-CM

## 2020-01-16 MED ORDER — INSULIN LISPRO (1 UNIT DIAL) 100 UNIT/ML (KWIKPEN): 5.0000 [IU] | PEN_INJECTOR | Freq: Three times a day (TID) | SUBCUTANEOUS | 11 refills | Status: DC

## 2020-01-16 MED ORDER — BASAGLAR KWIKPEN 100 UNIT/ML ~~LOC~~ SOPN: 11.0000 [IU] | PEN_INJECTOR | Freq: Every day | SUBCUTANEOUS | 5 refills | Status: DC

## 2020-01-16 NOTE — Progress Notes (Signed)
Patient ID: Manuel Olson, male   DOB: Feb 17, 1946, 74 y.o.   MRN: [id number]   This visit occurred during the SARS-CoV-2 public health emergency.  Safety protocols were in place, including screening questions prior to the visit, additional usage of staff PPE, and extensive cleaning of exam room while observing appropriate contact time as indicated for disinfecting solutions.   HPI: Manuel Olson is a 74 y.o.-year-old male, initially referred by his oncologist, Dr. Benay Spice, presenting for follow-up for DM2, dx in 01/2016, insulin-dependent, uncontrolled, with complications (CAD - s/p AMI 11/2016 - s/p 4x DES; Ao stenosis). He saw Dr. Dorris Fetch before.  Last visit 4 months ago.  Since last visit, he had a GI bleed with severe anemia and was hospitalized on 12/30/2019.  He had EGD and was found to have a gastric ulcer, which was treated endoscopically.  Of note, patient has a significant history of pancreatic cancer diagnosed 07/2017.  He started chemotherapy 09/2017.  Since then, he also had a kidney removed for renal cell carcinoma and at the same time he had pancreatic resection (head of the pancreas) 02/2018.  He was found to have metastases in his lymph nodes.  He restarted chemotherapy after the surgery but finished 06/2018.  Reviewed HbA1c levels: Lab Results  Component Value Date   HGBA1C 6.5 (A) 09/17/2019   HGBA1C 7.3 (A) 05/14/2019   HGBA1C 7.6 (A) 10/12/2018   His insulin production after pancreatic resection was low: Component     Latest Ref Rng & Units 10/12/2018  C-Peptide     0.80 - 3.85 ng/mL 0.53 (L)  Glucose, Plasma     65 - 99 mg/dL 168 (H)   Pt was on a regimen of: - Glipizide 5 mg daily  - Tresiba 20 units at bedtime (samples, cannot afford this)    He is currently on:  - Basaglar 8-10 >> 9 >> 10 >> 8-9 units at bedtime >> 9 units in a.m. - Humalog 2-4 >> 3-5 >> 5-8 units before meals If sugar before meals <70, do not take Humalog with that meals.  He checks  his sugars more than 4 times a day with his CGM  Freestyle libre CGM parameters (we will scan the reports):  - Average: 153 >> 146 - % active CGM time: 86% >> 72% of the time - Glucose variability 34.7 >> 26.7% (target < or = to 36%) - time in range:  - very low (<54): 0% >> 0% - low (54-69): 1% >> 1% - normal range (70-180): 72% >> 81% - high sugars (181-250): 20% >> 16% - very high sugars (>250): 7% >> 2%  In his log: - am:111, 128-158, 181, 230 - 2h after b'fast: n/c - lunch: 74, 130-235, 312 - 2h after lunch: n/c - dinner: 48, 76, 108-210 - 2h after dinner:  - bedtime: 79, 88-207, 236  Lowest sugar was  42 (libre) >> 86 >> 67 >> 60 >> 48; it is unclear at which level he has hypoglycemia awareness. Highest sugar was 337 >> 292 >> 379>> 300s.  Glucometer: True Metrix  Pt's meals are: - Breakfast: bacon + eggs + English muffin + occas. Tomato juice   - Lunch: grilled ham and cheese sandwich + potato chips - Dinner: chicken + veggies + starch - Snacks: fruit cup, pudding, fig newtons, icecreams  + CKD, last BUN/creatinine:  Lab Results  Component Value Date   BUN 28 (H) 01/09/2020   BUN 54 (H) 01/01/2020   CREATININE 1.67 (H)  01/09/2020   CREATININE 1.85 (H) 01/01/2020  On Cozaar. + HL; last set of lipids: Lab Results  Component Value Date   CHOL 92 10/09/2019   HDL 49.70 10/09/2019   LDLCALC 28 10/09/2019   TRIG 71.0 10/09/2019   CHOLHDL 2 10/09/2019  On Lipitor. - last eye exam was in 07/2018: No DR. 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  He has had laser surgeries in the past. -No numbness but has tingling in his feet  Pt has FH of DM in brother.  He also has a history of HTN.  ROS: Constitutional: no weight gain/= weight loss, no fatigue, no subjective hyperthermia, no subjective hypothermia Eyes: no blurry vision, no xerophthalmia ENT: no sore throat, no nodules palpated in neck, no dysphagia, no odynophagia, no hoarseness Cardiovascular: no CP/no SOB/no  palpitations/no leg swelling Respiratory: no cough/no SOB/no wheezing Gastrointestinal: no N/no V/no D/no C/no acid reflux Musculoskeletal: no muscle aches/no joint aches Skin: no rashes, no hair loss Neurological: no tremors/no numbness/no tingling/no dizziness  I reviewed pt's medications, allergies, PMH, social hx, family hx, and changes were documented in the history of present illness. Otherwise, unchanged from my initial visit note.  Past Medical History:  Diagnosis Date  . Arthritis   . CAD (coronary artery disease)    a. 11/2016 in [place]: STEMI s/p DES to mid LAD, DES to diagonal, DES x 2 to proximal and mid RCA b. 08/2019: NSTEMI with DES to distal RCA  . Essential hypertension   . Full dentures   . GERD (gastroesophageal reflux disease)   . History of stroke   . Hyperlipidemia   . Myocardial infarction ([place])   . Pancreatic cancer ([place])   . Pneumonia   . Renal cell carcinoma ([place])   . S/P TAVR (transcatheter aortic valve replacement)    a. 08/2017:  Edwards Sapien 3 THV (size 26 mm, model #9600CM26A , serial # [id number])  . Severe aortic stenosis    a. 08/2017: s/p TAVR by Dr. Angelena Form and Dr. Cyndia Bent.   . Type 2 diabetes mellitus ([place])   . Wears glasses   . Wears hearing aid    Past Surgical History:  Procedure Laterality Date  . APPENDECTOMY    . CARDIAC CATHETERIZATION    . CORONARY ANGIOGRAPHY N/A 08/23/2019   Procedure: CORONARY ANGIOGRAPHY;  Surgeon: Belva Crome, MD;  Location: [facility];  Service: Cardiovascular;  Laterality: N/A;  . CORONARY STENT INTERVENTION N/A 08/23/2019   Procedure: CORONARY STENT INTERVENTION;  Surgeon: Belva Crome, MD;  Location: [facility];  Service: Cardiovascular;  Laterality: N/A;  . CORONARY STENT PLACEMENT     x 4 in March 2018  . ESOPHAGOGASTRODUODENOSCOPY (EGD) WITH PROPOFOL N/A 01/02/2020   Procedure: ESOPHAGOGASTRODUODENOSCOPY (EGD) WITH PROPOFOL;  Surgeon: Jackquline Denmark, MD;  Location: [facility];  Service:  Endoscopy;  Laterality: N/A;  . EUS N/A 08/02/2017   Procedure: UPPER ENDOSCOPIC ULTRASOUND (EUS) RADIAL;  Surgeon: [name], MD;  Location: [facility];  Service: Endoscopy;  Laterality: N/A;  . EYE SURGERY Left   . HAND SURGERY Bilateral   . HEMOSTASIS CLIP PLACEMENT  01/02/2020   Procedure: HEMOSTASIS CLIP PLACEMENT;  Surgeon: Jackquline Denmark, MD;  Location: [facility];  Service: Endoscopy;;  . HERNIA REPAIR Right   . HOT HEMOSTASIS N/A 01/02/2020   Procedure: HOT HEMOSTASIS (ARGON PLASMA COAGULATION/BICAP);  Surgeon: Jackquline Denmark, MD;  Location: [facility];  Service: Endoscopy;  Laterality: N/A;  . LAPAROSCOPY N/A 02/19/2018  Procedure: DIAGNOSTIC LAPAROSCOPY, ERAS PATHWAY;  Surgeon: [name], MD;  Location: [facility];  Service: General;  Laterality: N/A;  . MULTIPLE TOOTH EXTRACTIONS    . NEPHRECTOMY Left 02/19/2018   Procedure: OPEN LEFT RADICAL NEPHRECTOMY;  Surgeon: Cleon Gustin, MD;  Location: [facility];  Service: Urology;  Laterality: Left;  . PORT-A-CATH REMOVAL N/A 06/13/2019   Procedure: PORT REMOVAL;  Surgeon: [name], MD;  Location: [facility];  Service: General;  Laterality: N/A;  . PORTACATH PLACEMENT N/A 09/15/2017   Procedure: INSERTION PORT-A-CATH;  Surgeon: [name], MD;  Location: [facility];  Service: General;  Laterality: N/A;  . RIGHT/LEFT HEART CATH AND CORONARY ANGIOGRAPHY N/A 07/05/2017   Procedure: RIGHT/LEFT HEART CATH AND CORONARY ANGIOGRAPHY;  Surgeon: [name], MD;  Location: [facility];  Service: Cardiovascular;  Laterality: N/A;  . SCLEROTHERAPY  01/02/2020   Procedure: SCLEROTHERAPY;  Surgeon: Jackquline Denmark, MD;  Location: [facility];  Service: Endoscopy;;  . SHOULDER ARTHROSCOPY WITH ROTATOR CUFF REPAIR Left   . TEE WITHOUT CARDIOVERSION N/A 08/22/2017   Procedure: TRANSESOPHAGEAL ECHOCARDIOGRAM (TEE);  Surgeon: Burnell Blanks, MD;  Location: [facility];  Service: Open Heart Surgery;  Laterality: N/A;  .  TRANSCATHETER AORTIC VALVE REPLACEMENT, TRANSFEMORAL N/A 08/22/2017   Procedure: TRANSCATHETER AORTIC VALVE REPLACEMENT, TRANSFEMORAL;  Surgeon: Burnell Blanks, MD;  Location: [facility];  Service: Open Heart Surgery;  Laterality: N/A;  . WHIPPLE PROCEDURE N/A 02/19/2018   Procedure: WHIPPLE PROCEDURE;  Surgeon: [name], MD;  Location: [facility];  Service: General;  Laterality: N/A;   Social History   Socioeconomic History  . Marital status: Married    Spouse name: Not on file  . Number of children: Not on file  Social Needs  Occupational History  .  Retired  Tobacco Use  . Smoking status: Former Smoker    Packs/day: 1.00    Years: 57.00    Pack years: 57.00    Types: Cigarettes    Start date: 09/12/1958    Last attempt to quit: 2018    Years since quitting: 1.5  . Smokeless tobacco: Never Used  Substance and Sexual Activity  . Alcohol use: No  . Drug use: No   Current Outpatient Medications on File Prior to Visit  Medication Sig Dispense Refill  . atorvastatin (LIPITOR) 20 MG tablet TAKE 1 TABLET BY MOUTH ONCE DAILY. 90 tablet 3  . clopidogrel (PLAVIX) 75 MG tablet Restart Plavix on Jan 11, 2020 90 tablet 1  . CREON 36000 units CPEP capsule TAKE 1 CAPSULE BY MOUTH BEFORE EACH MEAL AND SNACK. TAKE UP TO 6 CAPSULES PER DAY. (Patient taking differently: Take 36,000 Units by mouth See admin instructions. Take 1 capsule before each meal and with snacks.) 90 capsule 0  . Insulin Glargine (BASAGLAR KWIKPEN) 100 UNIT/ML SOPN Inject 0.1 mLs (10 Units total) into the skin at bedtime. (Patient taking differently: Inject 9 Units into the skin daily. ) 5 pen 5  . insulin lispro (HUMALOG KWIKPEN) 100 UNIT/ML KwikPen Inject 3-5 units 3 times a day with meals. (Patient taking differently: Inject 5 Units into the skin 3 (three) times daily before meals. Inject 5-8 units 3 times a day with meals.) 15 mL 11  . Insulin Pen Needle (PEN NEEDLES) 31G X 6 MM MISC Use as directed to check blood sugar 4  times a day. 400 each 2  . ondansetron (ZOFRAN) 4 MG tablet Take 1 tablet (4 mg total) by mouth every 6 (six) hours  as needed for nausea. 20 tablet 0  . pantoprazole (PROTONIX) 40 MG tablet Take 1 tablet (40 mg total) by mouth 2 (two) times daily. 30 tablet 1   No current facility-administered medications on file prior to visit.   No Known Allergies Family History  Problem Relation Age of Onset  . Lupus Mother   . CAD Brother   . Hypertension Brother     PE: BP 138/68   Pulse 75   Ht 5' 10"  (1.778 m)   Wt 132 lb (59.9 kg)   SpO2 99%   BMI 18.94 kg/m  Wt Readings from Last 3 Encounters:  01/16/20 132 lb (59.9 kg)  01/09/20 133 lb 6.4 oz (60.5 kg)  01/04/20 133 lb 9.6 oz (60.6 kg)   Constitutional: Thin, in NAD Eyes: PERRLA, EOMI, no exophthalmos ENT: moist mucous membranes, no thyromegaly, no cervical lymphadenopathy Cardiovascular: RRR, No MRG Respiratory: CTA B Gastrointestinal: abdomen soft, NT, ND, BS+ Musculoskeletal: no deformities, strength intact in all 4 Skin: moist, warm, no rashes Neurological: no tremor with outstretched hands, DTR normal in all 4  ASSESSMENT: 1. DM2, non-insulin-dependent, uncontrolled, with complications - CAD - s/p AMI 11/2016 - s/p DES x4 -  Ao stenosis  2. HL  3.  Protein caloric malnutrition  PLAN:  1. Patient with history of initially controlled diabetes, with entire right lung control around the time of highest pancreatic cancer diagnosis in 2018.  He had pancreatectomy, after which his insulin production was low so we have to start basal insulin.  We added mealtime insulin afterwards. -At last visit, sugars were excellent overnight but they were increasing after the first meal of the day and remained high until that time.  They improved after moving Combs in the morning, but they were still above target.  At that time, we discussed about varying the Humalog dose with the composition of the meal and the sugars before the  meal. -At this visit, he is doing a good job changing the doses of Humalog based on his meals and the sugars before meals, however, sugars do appear to be higher in the morning and to remain higher than target throughout the day.  Therefore, I advised him to increase Basaglar by 1 to 2 units.  Regarding Humalog, he is occasionally dropping his sugars especially after lunch.  We discussed about the possible need to use a lower dose of Humalog now that we are increasing the [place]. - I suggested to:  Patient Instructions  Please change: - Basaglar to 10 units in am x 3 days, then 11 units in a.m. - Humalog 5-6 units before the 3 meals, even if the sugars before the meal are normal  Please return in 4 months with your sugar log.   - We could not check an HbA1c today because this would not be accurate you to his recent transfusion and iron infusion. - advised to check sugars at different times of the day - 4x a day, rotating check times - advised for yearly eye exams >> he is UTD - return to clinic in 4 months  2. HL -Reviewed latest lipid panel from 09/2019: All fractions excellent Lab Results  Component Value Date   CHOL 92 10/09/2019   HDL 49.70 10/09/2019   LDLCALC 28 10/09/2019   TRIG 71.0 10/09/2019   CHOLHDL 2 10/09/2019  -Continues Lipitor without side effects  3.  Protein caloric malnutrition -Weight at last visit 138 lbs, now 132 -He continues to try to gain weight -  He cannot be on any diabetic medication with weight loss as a side effect.  As of now, he is on insulin only, which has not anabolic effect.  Philemon Kingdom, MD PhD [facility] Endocrinology

## 2020-01-16 NOTE — Patient Instructions (Addendum)
Please change: - Basaglar to 10 units in am x 3 days, then 11 units in a.m. - Humalog 5-6 units before the 3 meals, even if the sugars before the meal are normal  Please return in 4 months with your sugar log.

## 2020-01-17 ENCOUNTER — Ambulatory Visit: Payer: Medicare Other | Admitting: Gastroenterology

## 2020-01-17 DIAGNOSIS — C250 Malignant neoplasm of head of pancreas: Secondary | ICD-10-CM | POA: Diagnosis not present

## 2020-01-21 ENCOUNTER — Encounter: Payer: Self-pay | Admitting: Gastroenterology

## 2020-01-21 ENCOUNTER — Telehealth: Payer: Self-pay | Admitting: General Surgery

## 2020-01-21 ENCOUNTER — Ambulatory Visit (INDEPENDENT_AMBULATORY_CARE_PROVIDER_SITE_OTHER): Payer: Medicare Other | Admitting: Gastroenterology

## 2020-01-21 ENCOUNTER — Other Ambulatory Visit (INDEPENDENT_AMBULATORY_CARE_PROVIDER_SITE_OTHER): Payer: Medicare Other

## 2020-01-21 VITALS — BP 136/78 | HR 88 | Temp 98.5°F | Ht 69.5 in | Wt 131.4 lb

## 2020-01-21 DIAGNOSIS — K922 Gastrointestinal hemorrhage, unspecified: Secondary | ICD-10-CM

## 2020-01-21 DIAGNOSIS — E119 Type 2 diabetes mellitus without complications: Secondary | ICD-10-CM | POA: Diagnosis not present

## 2020-01-21 DIAGNOSIS — Z794 Long term (current) use of insulin: Secondary | ICD-10-CM | POA: Diagnosis not present

## 2020-01-21 DIAGNOSIS — Z7902 Long term (current) use of antithrombotics/antiplatelets: Secondary | ICD-10-CM | POA: Diagnosis not present

## 2020-01-21 DIAGNOSIS — K289 Gastrojejunal ulcer, unspecified as acute or chronic, without hemorrhage or perforation: Secondary | ICD-10-CM

## 2020-01-21 DIAGNOSIS — I25119 Atherosclerotic heart disease of native coronary artery with unspecified angina pectoris: Secondary | ICD-10-CM | POA: Diagnosis not present

## 2020-01-21 LAB — CBC WITH DIFFERENTIAL/PLATELET
Basophils Absolute: 0 10*3/uL (ref 0.0–0.1)
Basophils Relative: 0.5 % (ref 0.0–3.0)
Eosinophils Absolute: 0.1 10*3/uL (ref 0.0–0.7)
Eosinophils Relative: 1.4 % (ref 0.0–5.0)
HCT: 35.3 % — ABNORMAL LOW (ref 39.0–52.0)
Hemoglobin: 11.6 g/dL — ABNORMAL LOW (ref 13.0–17.0)
Lymphocytes Relative: 12.7 % (ref 12.0–46.0)
Lymphs Abs: 0.9 10*3/uL (ref 0.7–4.0)
MCHC: 33 g/dL (ref 30.0–36.0)
MCV: 94.3 fl (ref 78.0–100.0)
Monocytes Absolute: 0.9 10*3/uL (ref 0.1–1.0)
Monocytes Relative: 13.2 % — ABNORMAL HIGH (ref 3.0–12.0)
Neutro Abs: 5 10*3/uL (ref 1.4–7.7)
Neutrophils Relative %: 72.2 % (ref 43.0–77.0)
Platelets: 192 10*3/uL (ref 150.0–400.0)
RBC: 3.74 Mil/uL — ABNORMAL LOW (ref 4.22–5.81)
RDW: 16.9 % — ABNORMAL HIGH (ref 11.5–15.5)
WBC: 7 10*3/uL (ref 4.0–10.5)

## 2020-01-21 MED ORDER — PANTOPRAZOLE SODIUM 40 MG PO TBEC
40.0000 mg | DELAYED_RELEASE_TABLET | Freq: Two times a day (BID) | ORAL | 2 refills | Status: DC
Start: 2020-01-21 — End: 2020-01-27

## 2020-01-21 NOTE — Patient Instructions (Signed)
If you are age 76 or older, your body mass index should be between 23-30. Your Body mass index is 19.12 kg/m. If this is out of the aforementioned range listed, please consider follow up with your Primary Care Provider.  If you are age 72 or younger, your body mass index should be between 19-25. Your Body mass index is 19.12 kg/m. If this is out of the aformentioned range listed, please consider follow up with your Primary Care Provider.   You have been scheduled for an endoscopy. Please follow written instructions given to you at your visit today. If you use inhalers (even only as needed), please bring them with you on the day of your procedure.  You will be contaced by our office prior to your procedure for directions on holding your Plavix..  If you do not hear from our office 1 week prior to your scheduled procedure, please call [phone] to discuss.   We have sent the following medications to your pharmacy for you to pick up at your convenience:  Pantoprazole 40 mg  Due to recent changes in healthcare laws, you may see the results of your imaging and laboratory studies on MyChart before your provider has had a chance to review them.  We understand that in some cases there may be results that are confusing or concerning to you. Not all laboratory results come back in the same time frame and the provider may be waiting for multiple results in order to interpret others.  Please give [place] 48 hours in order for your provider to thoroughly review all the results before contacting the office for clarification of your results.

## 2020-01-21 NOTE — Telephone Encounter (Signed)
[facility] Pre-operative Risk Assessment     Request for surgical clearance:     Endoscopy Procedure  What type of surgery is being performed?     Endoscopy  When is this surgery scheduled?     03/06/2020  What type of clearance is required ?   Pharmacy  Are there any medications that need to be held prior to surgery and how long? Plavix for 5 days  Practice name and name of physician performing surgery?      [facility]  What is your office phone and fax number?      Phone- [phone]  [phone]  Anesthesia type (None, local, MAC, general) ?       MAC

## 2020-01-21 NOTE — Telephone Encounter (Signed)
Notified Mrs Mcglasson that her husband is to stop his Plavix 5 days prior to his Endoscopy per Dr Gwen Her. Patients wife verbalized understanding.

## 2020-01-21 NOTE — Progress Notes (Signed)
01/21/2020 Manuel Olson [id number] 1946/04/20   HISTORY OF PRESENT ILLNESS: This is a pleasant 74 year old male who is a patient of Dr. [name].  He is here for hospital follow-up.  He was admitted to the hospital from April 19 to April 25 for upper GI bleed.  EGD 4/22 revealed actively bleeding anastomotic ulcer s/p epi injection, bicap, and endoclip placement.  He received 4 units PRBC's during hospital stay.  Hpylori stool Ag was negative.  He has past medical history as listed below.  Just had a cardiac stent placed in December 2020 had been on Plavix.  That was held during his hospital stay, but has been resumed.  He has had no further signs of GI bleeding.  He is taking iron supplements so he says that makes his stools dark greenish, but there has been no sign of black stools like he had experienced prior to his hospitalization.  He says that he feels good.  No abdominal pain.  He says his appetite is good.  Hemoglobin at the time hospital discharge was 9.5 g.  He continues on pantoprazole 40 mg twice daily.    Past Medical History:  Diagnosis Date  . Arthritis   . CAD (coronary artery disease)    a. 11/2016 in [place]: STEMI s/p DES to mid LAD, DES to diagonal, DES x 2 to proximal and mid RCA b. 08/2019: NSTEMI with DES to distal RCA  . Essential hypertension   . Full dentures   . GERD (gastroesophageal reflux disease)   . History of stroke   . Hyperlipidemia   . Myocardial infarction ([place])   . Pancreatic cancer ([place])   . Pneumonia   . Renal cell carcinoma ([place])   . S/P TAVR (transcatheter aortic valve replacement)    a. 08/2017:  Edwards Sapien 3 THV (size 26 mm, model #9600CM26A , serial # [id number])  . Severe aortic stenosis    a. 08/2017: s/p TAVR by Dr. Angelena Form and Dr. Cyndia Bent.   . Type 2 diabetes mellitus ([place])   . Wears glasses   . Wears hearing aid    Past Surgical History:  Procedure Laterality Date  . APPENDECTOMY    . CARDIAC CATHETERIZATION    . CORONARY  ANGIOGRAPHY N/A 08/23/2019   Procedure: CORONARY ANGIOGRAPHY;  Surgeon: Belva Crome, MD;  Location: [facility];  Service: Cardiovascular;  Laterality: N/A;  . CORONARY STENT INTERVENTION N/A 08/23/2019   Procedure: CORONARY STENT INTERVENTION;  Surgeon: Belva Crome, MD;  Location: [facility];  Service: Cardiovascular;  Laterality: N/A;  . CORONARY STENT PLACEMENT     x 4 in March 2018  . ESOPHAGOGASTRODUODENOSCOPY (EGD) WITH PROPOFOL N/A 01/02/2020   Procedure: ESOPHAGOGASTRODUODENOSCOPY (EGD) WITH PROPOFOL;  Surgeon: Jackquline Denmark, MD;  [facility];  Service: Endoscopy;  Laterality: N/A;  . EUS N/A 08/02/2017   Procedure: UPPER ENDOSCOPIC ULTRASOUND (EUS) RADIAL;  Surgeon: [name], MD;  [facility];  Service: Endoscopy;  Laterality: N/A;  . EYE SURGERY Left   . HAND SURGERY Bilateral   . HEMOSTASIS CLIP PLACEMENT  01/02/2020   Procedure: HEMOSTASIS CLIP PLACEMENT;  Surgeon: Jackquline Denmark, MD;  [facility];  Service: Endoscopy;;  . HERNIA REPAIR Right   . HOT HEMOSTASIS N/A 01/02/2020   Procedure: HOT HEMOSTASIS (ARGON PLASMA COAGULATION/BICAP);  Surgeon: Jackquline Denmark, MD;  Location: [facility];  Service: Endoscopy;  Laterality: N/A;  . LAPAROSCOPY N/A 02/19/2018   Procedure: DIAGNOSTIC LAPAROSCOPY, ERAS PATHWAY;  Surgeon: [name], MD;  Location: [facility];  Service: General;  Laterality: N/A;  . MULTIPLE TOOTH EXTRACTIONS    . NEPHRECTOMY Left 02/19/2018   Procedure: OPEN LEFT RADICAL NEPHRECTOMY;  Surgeon: Cleon Gustin, MD;  Location: [facility];  Service: Urology;  Laterality: Left;  . PORT-A-CATH REMOVAL N/A 06/13/2019   Procedure: PORT REMOVAL;  Surgeon: [name], MD;  Location: [facility];  Service: General;  Laterality: N/A;  . PORTACATH PLACEMENT N/A 09/15/2017   Procedure: INSERTION PORT-A-CATH;  Surgeon: [name], MD;  Location: [facility];  Service: General;  Laterality: N/A;  . RIGHT/LEFT HEART  CATH AND CORONARY ANGIOGRAPHY N/A 07/05/2017   Procedure: RIGHT/LEFT HEART CATH AND CORONARY ANGIOGRAPHY;  Surgeon: [name], MD;  Location: [facility];  Service: Cardiovascular;  Laterality: N/A;  . SCLEROTHERAPY  01/02/2020   Procedure: SCLEROTHERAPY;  Surgeon: Jackquline Denmark, MD;  [facility];  Service: Endoscopy;;  . SHOULDER ARTHROSCOPY WITH ROTATOR CUFF REPAIR Left   . TEE WITHOUT CARDIOVERSION N/A 08/22/2017   Procedure: TRANSESOPHAGEAL ECHOCARDIOGRAM (TEE);  Surgeon: Burnell Blanks, MD;  Location: [facility];  Service: Open Heart Surgery;  Laterality: N/A;  . TRANSCATHETER AORTIC VALVE REPLACEMENT, TRANSFEMORAL N/A 08/22/2017   Procedure: TRANSCATHETER AORTIC VALVE REPLACEMENT, TRANSFEMORAL;  Surgeon: Burnell Blanks, MD;  Location: [facility];  Service: Open Heart Surgery;  Laterality: N/A;  . WHIPPLE PROCEDURE N/A 02/19/2018   Procedure: WHIPPLE PROCEDURE;  Surgeon: [name], MD;  Location: [facility];  Service: General;  Laterality: N/A;    reports that he quit smoking about 3 years ago. His smoking use included cigarettes. He started smoking about 61 years ago. He has a 57.00 pack-year smoking history. He has never used smokeless tobacco. He reports that he does not drink alcohol or use drugs. family history includes CAD in his brother; Hypertension in his brother; Lupus in his mother. No Known Allergies    Outpatient Encounter Medications as of 01/21/2020  Medication Sig  . atorvastatin (LIPITOR) 20 MG tablet TAKE 1 TABLET BY MOUTH ONCE DAILY.  [name] clopidogrel (PLAVIX) 75 MG tablet Restart Plavix on Jan 11, 2020  . CREON 36000 units CPEP capsule TAKE 1 CAPSULE BY MOUTH BEFORE EACH MEAL AND SNACK. TAKE UP TO 6 CAPSULES PER DAY. (Patient taking differently: Take 36,000 Units by mouth See admin instructions. Take 1 capsule before each meal and with snacks.)  . ferrous sulfate 325 (65 FE) MG tablet Take 325 mg by mouth in the morning and at bedtime.  .  Insulin Glargine (BASAGLAR KWIKPEN) 100 UNIT/ML Inject 0.11 mLs (11 Units total) into the skin at bedtime.  . insulin lispro (HUMALOG KWIKPEN) 100 UNIT/ML KwikPen Inject 0.05-0.08 mLs (5-8 Units total) into the skin 3 (three) times daily before meals. Inject 5-8 units 3 times a day with meals.  . Insulin Pen Needle (PEN NEEDLES) 31G X 6 MM MISC Use as directed to check blood sugar 4 times a day.  . pantoprazole (PROTONIX) 40 MG tablet Take 1 tablet (40 mg total) by mouth 2 (two) times daily.   No facility-administered encounter medications on file as of 01/21/2020.     REVIEW OF SYSTEMS  : All other systems reviewed and negative except where noted in the History of Present Illness.   PHYSICAL EXAM: BP 136/78 (BP Location: Left Arm, Patient Position: Sitting, Cuff Size: Normal)   Pulse 88   Temp 98.5 F (36.9 C)   Ht 5' 9.5" (1.765 m) Comment: height measured without  shoes  Wt 131 lb 6 oz (59.6 kg)   BMI 19.12 kg/m  General: Well developed white male in no acute distress Head: Normocephalic and atraumatic Eyes:  Sclerae anicteric, conjunctiva pink. Ears: Normal auditory acuity Lungs: Clear throughout to auscultation; no increased WOB. Heart: Regular rate and rhythm; no M/R/G. Abdomen: Soft, non-distended.  BS present.  Non-tender. Musculoskeletal: Symmetrical with no gross deformities  Skin: No lesions on visible extremities Extremities: No edema  Neurological: Alert oriented x 4, grossly non-focal Psychological:  Alert and cooperative. Normal mood and affect  ASSESSMENT AND PLAN: 1. Acute upper GI bleed:  EGD on 4/22 revealed actively bleeding anastomotic ulcer s/p epi injection, bicap, and endoclip placement.  Received 4 units PRBC's during hospital stay.  Hpylori stool Ag was negative.  No further sign of bleeding. 2. Acute blood loss anemia 3. History of CAD status post stenting last in 08/2019:  Is back on Plavix. 4. History of pancreatic cancer status post [place] 5.  History of renal cell cancer status post nephrectomy 6. Status post TAVR 7. Acute on chronic kidney disease stage III 8.  Insulin dependent DM:  Insulin will be adjusted prior to endoscopic procedure per protocol. Will resume normal dosing after procedure.  -Plan for repeat EGD with Dr. [name] in June to confirm ulcer healing, etc.  We will be in touch with his cardiologist, Dr. Domenic Polite, in regards to holding his Plavix for 5 days prior to his repeat endoscopy.  If he does not feel comfortable holding the Plavix again since he just had stents placed in December 2020 then we could consider repeating EGD and just would not be able to do biopsies, etc. -Repeat CBC today. -Continue pantoprazole 40 mg BID for now.  Prescription sent to pharmacy.   CC:  [name], MD

## 2020-01-21 NOTE — Progress Notes (Signed)
I agree with the above note, plan.  He should stay on his Plavix for EGD, it is okay to perform mucosal biopsies while on Plavix.

## 2020-01-21 NOTE — Telephone Encounter (Signed)
Patient may hold Plavix as requested prior to endoscopy.

## 2020-01-23 ENCOUNTER — Ambulatory Visit: Payer: Medicare Other | Admitting: Gastroenterology

## 2020-01-26 NOTE — Progress Notes (Signed)
Cardiology Office Note  Date: 01/27/2020   ID: Manuel Olson, DOB 1946-06-21, MRN [id number]  PCP:  [name], MD  Cardiologist:  Rozann Lesches, MD Electrophysiologist:  None   Chief Complaint  Patient presents with  . Cardiac follow-up    History of Present Illness: Manuel Olson is a 74 y.o. male last seen in December 2020 by Ms. Strader PA-C.  He is here today with his wife for a follow-up visit.  From a cardiac perspective, he does not report any angina symptoms, has stable NYHA class II dyspnea.  No recent lightheadedness or syncope.  I reviewed home blood pressure checks with systolics in the 924Q to 683M.  No longer on antihypertensive therapy.  He is being evaluated by [facility], I reviewed the recent note. He is status post acute upper GI bleed with EGD in April showing active anastomotic bleeding sites. Follow-up EGD with potential biopsies are now pending and he will be off Plavix for 5 days prior to that in June. He is 6 months out from DES intervention in December 2020.  There is some risk of stent thrombosis, we discussed this today.  He otherwise continues on Lipitor with aggressive lipid control.  Past Medical History:  Diagnosis Date  . Arthritis   . CAD (coronary artery disease)    a. 11/2016 in [place]: STEMI s/p DES to mid LAD, DES to diagonal, DES x 2 to proximal and mid RCA b. 08/2019: NSTEMI with DES to distal RCA  . Essential hypertension   . Full dentures   . GERD (gastroesophageal reflux disease)   . History of stroke   . Hyperlipidemia   . Myocardial infarction ([place])   . Pancreatic cancer ([place])   . Pneumonia   . Renal cell carcinoma ([place])   . S/P TAVR (transcatheter aortic valve replacement)    a. 08/2017:  Edwards Sapien 3 THV (size 26 mm, model #9600CM26A , serial # [id number])  . Severe aortic stenosis    a. 08/2017: s/p TAVR by Dr. Angelena Form and Dr. Cyndia Bent.   . Type 2 diabetes mellitus ([place])   . Wears glasses   .  Wears hearing aid     Past Surgical History:  Procedure Laterality Date  . APPENDECTOMY    . CARDIAC CATHETERIZATION    . CORONARY ANGIOGRAPHY N/A 08/23/2019   Procedure: CORONARY ANGIOGRAPHY;  Surgeon: Belva Crome, MD;  Location: [facility];  Service: Cardiovascular;  Laterality: N/A;  . CORONARY STENT INTERVENTION N/A 08/23/2019   Procedure: CORONARY STENT INTERVENTION;  Surgeon: Belva Crome, MD;  Location: [facility];  Service: Cardiovascular;  Laterality: N/A;  . CORONARY STENT PLACEMENT     x 4 in March 2018  . ESOPHAGOGASTRODUODENOSCOPY (EGD) WITH PROPOFOL N/A 01/02/2020   Procedure: ESOPHAGOGASTRODUODENOSCOPY (EGD) WITH PROPOFOL;  Surgeon: Jackquline Denmark, MD;  [facility];  Service: Endoscopy;  Laterality: N/A;  . EUS N/A 08/02/2017   Procedure: UPPER ENDOSCOPIC ULTRASOUND (EUS) RADIAL;  Surgeon: [name], MD;  [facility];  Service: Endoscopy;  Laterality: N/A;  . EYE SURGERY Left   . HAND SURGERY Bilateral   . HEMOSTASIS CLIP PLACEMENT  01/02/2020   Procedure: HEMOSTASIS CLIP PLACEMENT;  Surgeon: Jackquline Denmark, MD;  [facility];  Service: Endoscopy;;  . HERNIA REPAIR Right   . HOT HEMOSTASIS N/A 01/02/2020   Procedure: HOT HEMOSTASIS (ARGON PLASMA COAGULATION/BICAP);  Surgeon: Jackquline Denmark, MD;  [facility];  Service: Endoscopy;  Laterality: N/A;  . LAPAROSCOPY N/A 02/19/2018   Procedure: DIAGNOSTIC LAPAROSCOPY, ERAS PATHWAY;  Surgeon: [name], MD;  Location: [facility];  Service: General;  Laterality: N/A;  . MULTIPLE TOOTH EXTRACTIONS    . NEPHRECTOMY Left 02/19/2018   Procedure: OPEN LEFT RADICAL NEPHRECTOMY;  Surgeon: Cleon Gustin, MD;  Location: [facility];  Service: Urology;  Laterality: Left;  . PORT-A-CATH REMOVAL N/A 06/13/2019   Procedure: PORT REMOVAL;  Surgeon: [name], MD;  Location: [facility];  Service: General;  Laterality: N/A;  . PORTACATH PLACEMENT N/A 09/15/2017   Procedure: INSERTION  PORT-A-CATH;  Surgeon: [name], MD;  Location: [facility];  Service: General;  Laterality: N/A;  . RIGHT/LEFT HEART CATH AND CORONARY ANGIOGRAPHY N/A 07/05/2017   Procedure: RIGHT/LEFT HEART CATH AND CORONARY ANGIOGRAPHY;  Surgeon: [name], MD;  Location: [facility];  Service: Cardiovascular;  Laterality: N/A;  . SCLEROTHERAPY  01/02/2020   Procedure: SCLEROTHERAPY;  Surgeon: Jackquline Denmark, MD;  [facility];  Service: Endoscopy;;  . SHOULDER ARTHROSCOPY WITH ROTATOR CUFF REPAIR Left   . TEE WITHOUT CARDIOVERSION N/A 08/22/2017   Procedure: TRANSESOPHAGEAL ECHOCARDIOGRAM (TEE);  Surgeon: Burnell Blanks, MD;  Location: [facility];  Service: Open Heart Surgery;  Laterality: N/A;  . TRANSCATHETER AORTIC VALVE REPLACEMENT, TRANSFEMORAL N/A 08/22/2017   Procedure: TRANSCATHETER AORTIC VALVE REPLACEMENT, TRANSFEMORAL;  Surgeon: Burnell Blanks, MD;  Location: [facility];  Service: Open Heart Surgery;  Laterality: N/A;  . WHIPPLE PROCEDURE N/A 02/19/2018   Procedure: WHIPPLE PROCEDURE;  Surgeon: [name], MD;  Location: [facility];  Service: General;  Laterality: N/A;    Current Outpatient Medications  Medication Sig Dispense Refill  . atorvastatin (LIPITOR) 20 MG tablet Take 1 tablet (20 mg total) by mouth daily. 90 tablet 3  . clopidogrel (PLAVIX) 75 MG tablet Restart Plavix on Jan 11, 2020 90 tablet 1  . CREON 36000 units CPEP capsule TAKE 1 CAPSULE BY MOUTH BEFORE EACH MEAL AND SNACK. TAKE UP TO 6 CAPSULES PER DAY. 90 capsule 0  . ferrous sulfate 325 (65 FE) MG tablet Take 325 mg by mouth in the morning and at bedtime.    . Insulin Glargine (BASAGLAR KWIKPEN) 100 UNIT/ML Inject 0.11 mLs (11 Units total) into the skin at bedtime. 5 pen 5  . insulin lispro (HUMALOG KWIKPEN) 100 UNIT/ML KwikPen Inject 0.05-0.08 mLs (5-8 Units total) into the skin 3 (three) times daily before meals. Inject 5-8 units 3 times a day with meals. 5 pen 11  . Insulin Pen  Needle (PEN NEEDLES) 31G X 6 MM MISC Use as directed to check blood sugar 4 times a day. 400 each 2  . pantoprazole (PROTONIX) 40 MG tablet Take 1 tablet (40 mg total) by mouth 2 (two) times daily. 30 tablet 1   No current facility-administered medications for this visit.   Allergies:  Patient has no known allergies.   ROS:  No palpitations or syncope.  Physical Exam: VS:  BP 122/78   Pulse 86   Ht 5' 9"  (1.753 m)   Wt 133 lb 6.4 oz (60.5 kg)   SpO2 99%   BMI 19.70 kg/m , BMI Body mass index is 19.7 kg/m.  Wt Readings from Last 3 Encounters:  01/27/20 133 lb 6.4 oz (60.5 kg)  01/21/20 131 lb 6 oz (59.6 kg)  01/16/20 132 lb (59.9 kg)    General:  Thin elderly male, appears comfortable at rest. HEENT: Conjunctiva and lids normal, wearing a mask. Neck:  Supple, no elevated JVP or carotid bruits, no thyromegaly. Lungs: Clear to auscultation, nonlabored breathing at rest. Cardiac: Regular rate and rhythm, no S3, soft systolic murmur, no pericardial rub. Abdomen: Soft, bowel sounds present, no guarding or rebound. Extremities: No pitting edema, distal pulses 2+.  ECG:  An ECG dated 12/30/2019 was personally reviewed today and demonstrated:  Sinus rhythm with poor R wave progression.  Recent Labwork: 10/09/2019: TSH 1.77 01/01/2020: Magnesium 2.0 01/09/2020: ALT 14; AST 16; BUN 28; Creatinine 1.67; Potassium 4.4; Sodium 137 01/21/2020: Hemoglobin 11.6; Platelets 192.0     Component Value Date/Time   CHOL 92 10/09/2019 1019   TRIG 71.0 10/09/2019 1019   HDL 49.70 10/09/2019 1019   CHOLHDL 2 10/09/2019 1019   VLDL 14.2 10/09/2019 1019   LDLCALC 28 10/09/2019 1019    Other Studies Reviewed Today:  Cardiac catheterization 08/23/2019:  A stent was successfully placed.    Non-ST elevation myocardial infarction due to de novo plaque rupture in the distal RCA.  Complicated but ultimately successful distal RCA stent using a 22 x 2.75 Onyx postdilated to 3.0 decreasing 99% stenosis  to 0% with TIMI grade III flow.  Left main is widely patent  LAD contains a patent mid vessel stent.  Proximal to the stent is eccentric 50% narrowing.  First diagonal contains a stent that is widely patent.  A moderate size ramus intermedius contains 50 to 75% stenosis in its mid segment.  Circumflex is relatively small and is widely patent.  The SAPIEN TAVR valve was not crossed.  Echocardiogram 08/06/2019: 1. Left ventricular ejection fraction, by visual estimation, is 50%. The  left ventricle has low normal function. There is borderline left  ventricular hypertrophy.  2. Elevated left atrial pressure.  3. Left ventricular diastolic parameters are consistent with Grade I  diastolic dysfunction (impaired relaxation).  4. The left ventricle demonstrates global hypokinesis.  5. Global right ventricle has normal systolic function.The right  ventricular size is normal. No increase in right ventricular wall  thickness.  6. Left atrial size was normal.  7. Right atrial size was normal.  8. Presence of pericardial fat pad.  9. Mild to moderate mitral annular calcification.  10. The mitral valve is grossly normal. Trace mitral valve regurgitation.  11. The tricuspid valve is grossly normal. Tricuspid valve regurgitation  is trivial.  12. A 82m Edwards Sapien 3 THV is present in the aortic position. No  perivalvular leak.  13. Aortic valve mean gradient measures 9.0 mmHg.  14. Aortic valve regurgitation is not visualized.  15. The pulmonic valve was grossly normal. Pulmonic valve regurgitation is  trivial.  16. The inferior vena cava is dilated in size with >50% respiratory  variability, suggesting right atrial pressure of 8 mmHg.  17. TR signal is inadequate for assessing pulmonary artery systolic  pressure.   Assessment and Plan:  1.  Multivessel CAD status post DES to the LAD, DES to the first diagonal, and DES x2 to the RCA in 2018.  He is more recently status post  NSTEMI with placement of DES to the distal RCA in December 2020.  Reports no active angina at this time.  Continue Plavix and statin.  Currently not on beta-blocker or ARB given prior documentation of bradycardia and also relative hypotension.  Continue to follow and readdress for potential medication adjustments over time.  2.  Recent GI bleeding with plan for endoscopy in June as discussed above.  He will be off Plavix 5 days prior, there is  some risk of stent thrombosis but he is greater than 6 months out from the event.  3.  Severe aortic stenosis status post TAVR in 2018.  Follow-up echocardiogram from November 2020 showed normal prosthetic function without perivalvular leak.  Medication Adjustments/Labs and Tests Ordered: Current medicines are reviewed at length with the patient today.  Concerns regarding medicines are outlined above.   Tests Ordered: No orders of the defined types were placed in this encounter.   Medication Changes: Meds ordered this encounter  Medications  . DISCONTD: atorvastatin (LIPITOR) 40 MG tablet    Sig: Take 1 tablet (40 mg total) by mouth daily.    Dispense:  90 tablet    Refill:  3    01/24/20 dose increased to 40 mg qd  . atorvastatin (LIPITOR) 20 MG tablet    Sig: Take 1 tablet (20 mg total) by mouth daily.    Dispense:  90 tablet    Refill:  3    Disposition:  Follow up 6 months in the [place] office.  Signed, Satira Sark, MD, [facility] 01/27/2020 9:52 AM    [facility] at [facility] [address] [place], [place] [address] Phone: [phone]; Fax: [phone]

## 2020-01-27 ENCOUNTER — Ambulatory Visit (INDEPENDENT_AMBULATORY_CARE_PROVIDER_SITE_OTHER): Payer: Medicare Other | Admitting: Cardiology

## 2020-01-27 ENCOUNTER — Other Ambulatory Visit: Payer: Self-pay

## 2020-01-27 ENCOUNTER — Encounter: Payer: Self-pay | Admitting: Cardiology

## 2020-01-27 VITALS — BP 122/78 | HR 86 | Ht 69.0 in | Wt 133.4 lb

## 2020-01-27 DIAGNOSIS — Z952 Presence of prosthetic heart valve: Secondary | ICD-10-CM

## 2020-01-27 DIAGNOSIS — E782 Mixed hyperlipidemia: Secondary | ICD-10-CM

## 2020-01-27 DIAGNOSIS — I25119 Atherosclerotic heart disease of native coronary artery with unspecified angina pectoris: Secondary | ICD-10-CM | POA: Diagnosis not present

## 2020-01-27 MED ORDER — ATORVASTATIN CALCIUM 40 MG PO TABS
40.0000 mg | ORAL_TABLET | Freq: Every day | ORAL | 3 refills | Status: DC
Start: 2020-01-27 — End: 2020-01-27

## 2020-01-27 MED ORDER — ATORVASTATIN CALCIUM 20 MG PO TABS
20.0000 mg | ORAL_TABLET | Freq: Every day | ORAL | 3 refills | Status: DC
Start: 2020-01-27 — End: 2020-04-17

## 2020-01-27 NOTE — Patient Instructions (Addendum)
Medication Instructions:  Your physician recommends that you continue on your current medications as directed. Please refer to the Current Medication list given to you today.  *If you need a refill on your cardiac medications before your next appointment, please call your pharmacy*   Lab Work: None  If you have labs (blood work) drawn today and your tests are completely normal, you will receive your results only by: [name] MyChart Message (if you have MyChart) OR . A paper copy in the mail If you have any lab test that is abnormal or we need to change your treatment, we will call you to review the results.   Testing/Procedures: None    Follow-Up: At [facility], you and your health needs are our priority.  As part of our continuing mission to provide you with exceptional heart care, we have created designated Provider Care Teams.  These Care Teams include your primary Cardiologist (physician) and Advanced Practice Providers (APPs -  Physician Assistants and Nurse Practitioners) who all work together to provide you with the care you need, when you need it.  We recommend signing up for the patient portal called "MyChart".  Sign up information is provided on this After Visit Summary.  MyChart is used to connect with patients for Virtual Visits (Telemedicine).  Patients are able to view lab/test results, encounter notes, upcoming appointments, etc.  Non-urgent messages can be sent to your provider as well.   To learn more about what you can do with MyChart, go to [email or web address].    Your next appointment:   6 month(s)  The format for your next appointment:   In Person  Provider:   Rozann Lesches, MD   Other Instructions None      Thank you for choosing [facility] !

## 2020-01-29 ENCOUNTER — Other Ambulatory Visit: Payer: Self-pay

## 2020-01-29 ENCOUNTER — Other Ambulatory Visit (HOSPITAL_COMMUNITY): Payer: Self-pay | Admitting: Urology

## 2020-01-29 ENCOUNTER — Ambulatory Visit (HOSPITAL_COMMUNITY)
Admission: RE | Admit: 2020-01-29 | Discharge: 2020-01-29 | Disposition: A | Discharge status: Home / Self Care | Payer: Medicare Other | Source: Ambulatory Visit | Attending: Urology | Admitting: Urology

## 2020-01-29 DIAGNOSIS — J449 Chronic obstructive pulmonary disease, unspecified: Secondary | ICD-10-CM | POA: Diagnosis not present

## 2020-01-29 DIAGNOSIS — C642 Malignant neoplasm of left kidney, except renal pelvis: Secondary | ICD-10-CM | POA: Insufficient documentation

## 2020-01-31 ENCOUNTER — Telehealth: Payer: Self-pay | Admitting: Oncology

## 2020-01-31 ENCOUNTER — Inpatient Hospital Stay: Payer: Medicare Other

## 2020-01-31 ENCOUNTER — Inpatient Hospital Stay: Payer: Medicare Other | Attending: Oncology | Admitting: Oncology

## 2020-01-31 ENCOUNTER — Other Ambulatory Visit: Payer: Self-pay

## 2020-01-31 VITALS — BP 127/88 | HR 70 | Temp 97.0°F | Resp 17 | Ht 69.0 in | Wt 132.5 lb

## 2020-01-31 DIAGNOSIS — N289 Disorder of kidney and ureter, unspecified: Secondary | ICD-10-CM | POA: Insufficient documentation

## 2020-01-31 DIAGNOSIS — I25119 Atherosclerotic heart disease of native coronary artery with unspecified angina pectoris: Secondary | ICD-10-CM | POA: Diagnosis not present

## 2020-01-31 DIAGNOSIS — D696 Thrombocytopenia, unspecified: Secondary | ICD-10-CM | POA: Diagnosis not present

## 2020-01-31 DIAGNOSIS — Z8589 Personal history of malignant neoplasm of other organs and systems: Secondary | ICD-10-CM | POA: Insufficient documentation

## 2020-01-31 DIAGNOSIS — Z9221 Personal history of antineoplastic chemotherapy: Secondary | ICD-10-CM | POA: Diagnosis not present

## 2020-01-31 DIAGNOSIS — Z87891 Personal history of nicotine dependence: Secondary | ICD-10-CM | POA: Insufficient documentation

## 2020-01-31 DIAGNOSIS — Z8507 Personal history of malignant neoplasm of pancreas: Secondary | ICD-10-CM | POA: Insufficient documentation

## 2020-01-31 DIAGNOSIS — J449 Chronic obstructive pulmonary disease, unspecified: Secondary | ICD-10-CM | POA: Diagnosis not present

## 2020-01-31 DIAGNOSIS — C250 Malignant neoplasm of head of pancreas: Secondary | ICD-10-CM | POA: Diagnosis not present

## 2020-01-31 DIAGNOSIS — I10 Essential (primary) hypertension: Secondary | ICD-10-CM | POA: Insufficient documentation

## 2020-01-31 DIAGNOSIS — E119 Type 2 diabetes mellitus without complications: Secondary | ICD-10-CM | POA: Diagnosis not present

## 2020-01-31 DIAGNOSIS — I2510 Atherosclerotic heart disease of native coronary artery without angina pectoris: Secondary | ICD-10-CM | POA: Insufficient documentation

## 2020-01-31 DIAGNOSIS — D509 Iron deficiency anemia, unspecified: Secondary | ICD-10-CM | POA: Diagnosis not present

## 2020-01-31 DIAGNOSIS — Z8672 Personal history of thrombophlebitis: Secondary | ICD-10-CM | POA: Insufficient documentation

## 2020-01-31 LAB — CBC WITH DIFFERENTIAL (CANCER CENTER ONLY)
Abs Immature Granulocytes: 0.01 10*3/uL (ref 0.00–0.07)
Basophils Absolute: 0 10*3/uL (ref 0.0–0.1)
Basophils Relative: 1 %
Eosinophils Absolute: 0.2 10*3/uL (ref 0.0–0.5)
Eosinophils Relative: 3 %
HCT: 36 % — ABNORMAL LOW (ref 39.0–52.0)
Hemoglobin: 11.2 g/dL — ABNORMAL LOW (ref 13.0–17.0)
Immature Granulocytes: 0 %
Lymphocytes Relative: 12 %
Lymphs Abs: 0.7 10*3/uL (ref 0.7–4.0)
MCH: 30.6 pg (ref 26.0–34.0)
MCHC: 31.1 g/dL (ref 30.0–36.0)
MCV: 98.4 fL (ref 80.0–100.0)
Monocytes Absolute: 0.9 10*3/uL (ref 0.1–1.0)
Monocytes Relative: 16 %
Neutro Abs: 3.9 10*3/uL (ref 1.7–7.7)
Neutrophils Relative %: 68 %
Platelet Count: 125 10*3/uL — ABNORMAL LOW (ref 150–400)
RBC: 3.66 MIL/uL — ABNORMAL LOW (ref 4.22–5.81)
RDW: 16.4 % — ABNORMAL HIGH (ref 11.5–15.5)
WBC Count: 5.6 10*3/uL (ref 4.0–10.5)
nRBC: 0 % (ref 0.0–0.2)

## 2020-01-31 LAB — SAMPLE TO BLOOD BANK

## 2020-01-31 NOTE — Progress Notes (Signed)
[facility] OFFICE PROGRESS NOTE   Diagnosis: Pancreas cancer, iron deficiency anemia  INTERVAL HISTORY:   Manuel Olson returns as scheduled.  He feels well.  He is taking iron.  No apparent bleeding.  Good appetite.  Phlebitis of the right arm has resolved.  He is scheduled for a repeat endoscopy in June.  Objective:  Vital signs in last 24 hours:  Blood pressure 127/88, pulse 70, temperature (!) 97 F (36.1 C), temperature source Temporal, resp. rate 17, height 5' 9"  (1.753 m), weight 132 lb 8 oz (60.1 kg), SpO2 100 %.     GI: No hepatosplenomegaly, no mass, nontender Vascular: No evidence of phlebitis at the right arm   Lab Results:  Lab Results  Component Value Date   WBC 5.6 01/31/2020   HGB 11.2 (L) 01/31/2020   HCT 36.0 (L) 01/31/2020   MCV 98.4 01/31/2020   PLT 125 (L) 01/31/2020   NEUTROABS 3.9 01/31/2020    CMP  Lab Results  Component Value Date   NA 137 01/09/2020   K 4.4 01/09/2020   CL 104 01/09/2020   CO2 27 01/09/2020   GLUCOSE 123 (H) 01/09/2020   BUN 28 (H) 01/09/2020   CREATININE 1.67 (H) 01/09/2020   CALCIUM 8.5 (L) 01/09/2020   PROT 5.6 (L) 01/09/2020   ALBUMIN 2.8 (L) 01/09/2020   AST 16 01/09/2020   ALT 14 01/09/2020   ALKPHOS 100 01/09/2020   BILITOT 0.2 (L) 01/09/2020   GFRNONAA 39 (L) 01/09/2020   GFRAA 45 (L) 01/09/2020    No results found for: CEA1  Lab Results  Component Value Date   INR 1.2 12/30/2019    Imaging:  DG Chest 2 View  Result Date: 01/29/2020 CLINICAL DATA:  LEFT renal cell carcinoma, history coronary disease post STEMI and coronary PTCA, GERD, hypertension, type II diabetes mellitus, TAVR EXAM: CHEST - 2 VIEW COMPARISON:  12/30/2019 FINDINGS: Normal heart size post coronary stenting and TAVR. Mediastinal contours and pulmonary vascularity normal. Atherosclerotic calcification aorta. Emphysematous and minimal bronchitic changes consistent with COPD. Calcified granuloma lateral RIGHT mid lung  unchanged. No acute infiltrate, pleural effusion or pneumothorax. Diffuse osseous demineralization with mild scattered endplate spur formation thoracic spine. IMPRESSION: COPD changes. No acute abnormalities. Electronically Signed   By: Lavonia Dana M.D.   On: 01/29/2020 15:42    Medications: I have reviewed the patient's current medications.   Assessment/Plan: 1. Adenocarcinoma of the pancreas head/neck, status post an EUS biopsy 08/02/2017 confirming adenocarcinoma ? EUS 08/02/2017-pancreas head/neck mass, abutment of the portal vein, no peripancreatic adenopathy ? MRI abdomen 07/27/2017-head/neck pancreas mass with mass-effect on the portal splenic confluence, no involvement of the celiac axis or superior mesenteric artery, no evidence of metastatic disease ? PET scan 08/31/2017-very hypermetabolism at the pancreatic head mass, no evidence of metastatic disease. The isolated right lower lobe nodule below PET resolution, left sided renal mass with decreased activity relative to the normal adjacent kidney ? Cycle 1 FOLFIRINOX 09/16/2017 ? Cycle 2 FOLFOX 10/16/2017 ? Cycle 3 FOLFOX 10/30/2017 ? Cycle 4 FOLFIRINOX 11/14/2017 (irinotecan resumed with a dose reduction) ? Cycle 5 FOLFIRINOX3/20/2019 (Oxaliplatin dose reduced due to thrombocytopenia) ? Cycle 6 FOLFIRINOX4/11/2017 ? CTs 12/25/2017-stable right lower lobe nodule, decrease in size of the pancreas head mass, stable left kidney mass ? Pancreaticoduodenectomy and left nephrectomy 02/19/2018,pT1cpN1, 2/18 lymph nodes positive, lymphovascular and perineural invasion present, mild to moderate treatment effect, tumor involved adherent portal vein with negative portal vein margins ? Cycle 1 adjuvant gemcitabine/Abraxane 04/25/2018 ?  Cycle 2 adjuvant gemcitabine/Abraxane 05/16/2018 ? Cycle 3 adjuvant gemcitabine/Abraxane 05/31/2018 ? Cycle 4 adjuvant gemcitabine/Abraxane 06/13/2018 ? Cycle 5 adjuvant gemcitabine/Abraxane 06/27/2018 ? Cycle 6 adjuvant  gemcitabine/Abraxane 07/11/2018  2. 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  5.Diabetes  6.History of tobacco use  7.Hypertension  8.Port-A-Cath placement 09/15/2017, Dr. Barry Dienes  9. Diarrhea-potentially related to pancreas insufficiency, he began a trial of pancreatic enzyme replacement 05/16/2018: Diarrhea resolved  10. Dehydrationon 03/19/2018-statustreatments with IV fluidsforpast 2 weeks  Admission 04/03/2018 with dehydration and hypotension  Persistent orthostatic hypotension-compression stockings and Florinef added 7/30/2019improved, Florinef discontinued  11. 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  Hospitalization with GI bleed 12/30/2019 through 01/05/2020.  Upper endoscopy on 01/02/2020 showed an actively bleeding anastomotic ulcer.    Disposition: Manuel Olson is in clinical remission from pancreas cancer.  The iron deficiency anemia has improved compared to when he was here last month.  He will continue ferrous sulfate.  He is scheduled for repeat endoscopy with Dr. [name] next month.  He will return for an office visit in 2 months.  I encouraged him to obtain the COVID-19 vaccine.  He has decided against taking the vaccine.  [name], MD  01/31/2020  10:13 AM

## 2020-01-31 NOTE — Telephone Encounter (Signed)
Scheduled per 5/21 los. Printed avs and calendar for pt.

## 2020-02-06 ENCOUNTER — Ambulatory Visit: Payer: Medicare Other | Admitting: Cardiology

## 2020-02-12 DIAGNOSIS — B029 Zoster without complications: Secondary | ICD-10-CM | POA: Diagnosis not present

## 2020-02-13 DIAGNOSIS — C642 Malignant neoplasm of left kidney, except renal pelvis: Secondary | ICD-10-CM | POA: Diagnosis not present

## 2020-02-13 NOTE — Telephone Encounter (Signed)
Zehr, Laban Emperor, PA-C  Letta Pate, CMA  Per Dr. [name], just have him remain on Plavix for EGD.       Previous Messages   ----- Message -----  From: [name], MD  Sent: 01/21/2020 12:21 PM EDT  To: Loralie Champagne, PA-C     ----- Message -----  From: Loralie Champagne, PA-C  Sent: 01/21/2020 11:56 AM EDT  To: [name], MD     Routed Note  Author: [name], MD Service: Gastroenterology Author Type: Physician  Filed: 01/21/2020 12:21 PM Encounter Date: 01/21/2020 Status: Signed  Editor: [name], MD (Physician)     I agree with the above note, plan.  He should stay on his Plavix for EGD, it is okay to perform mucosal biopsies while on Plavix.

## 2020-02-13 NOTE — Telephone Encounter (Signed)
I have advised patient's wife (caregiver) that per Dr [name] who will actually be performing procedure, he may CONTINUE Plavix for procedure since he can go ahead and do mucosal biopsies. Wife verbalizes understanding of this.

## 2020-03-04 ENCOUNTER — Ambulatory Visit (INDEPENDENT_AMBULATORY_CARE_PROVIDER_SITE_OTHER): Payer: Medicare Other

## 2020-03-04 ENCOUNTER — Other Ambulatory Visit: Payer: Self-pay | Admitting: Gastroenterology

## 2020-03-04 DIAGNOSIS — Z1159 Encounter for screening for other viral diseases: Secondary | ICD-10-CM

## 2020-03-04 LAB — SARS CORONAVIRUS 2 (TAT 6-24 HRS): SARS Coronavirus 2: NEGATIVE

## 2020-03-06 ENCOUNTER — Encounter: Payer: Self-pay | Admitting: Gastroenterology

## 2020-03-06 ENCOUNTER — Ambulatory Visit (AMBULATORY_SURGERY_CENTER): Payer: Medicare Other | Admitting: Gastroenterology

## 2020-03-06 ENCOUNTER — Other Ambulatory Visit: Payer: Self-pay

## 2020-03-06 VITALS — BP 165/88 | HR 66 | Temp 96.6°F | Resp 13 | Ht 69.0 in | Wt 131.0 lb

## 2020-03-06 DIAGNOSIS — K289 Gastrojejunal ulcer, unspecified as acute or chronic, without hemorrhage or perforation: Secondary | ICD-10-CM

## 2020-03-06 MED ORDER — SODIUM CHLORIDE 0.9 % IV SOLN: 500.0000 mL | Freq: Once | INTRAVENOUS | Status: DC

## 2020-03-06 NOTE — Op Note (Signed)
[facility] Patient Name: Reise Gladney Procedure Date: 03/06/2020 8:27 AM MRN: [id number] Endoscopist: [name] , MD Age: 74 Referring MD:  Date of Birth: [birth year] Gender: Male Account #: [id number] Procedure:                Upper GI endoscopy Indications:              Whipple for pancreatic adenocarcinoma (node +)                            2019, recent UGI bleeding from [place] anastomitic                            ulcer, treated during [facility] EGD 12/2019, H. pylori                            stool Ag negative; now on iron, PPI BID Medicines:                Monitored Anesthesia Care Procedure:                Pre-Anesthesia Assessment:                           - Prior to the procedure, a History and Physical                            was performed, and patient medications and                            allergies were reviewed. The patient's tolerance of                            previous anesthesia was also reviewed. The risks                            and benefits of the procedure and the sedation                            options and risks were discussed with the patient.                            All questions were answered, and informed consent                            was obtained. Prior Anticoagulants: The patient has                            taken Plavix (clopidogrel), last dose was day of                            procedure. ASA Grade Assessment: III - A patient                            with severe systemic disease. After reviewing the  risks and benefits, the patient was deemed in                            satisfactory condition to undergo the procedure.                           After obtaining informed consent, the endoscope was                            passed under direct vision. Throughout the                            procedure, the patient's blood pressure, pulse, and                            oxygen  saturations were monitored continuously. The                            Endoscope was introduced through the mouth, and                            advanced to the afferent and efferent jejunal                            loops. The upper GI endoscopy was accomplished                            without difficulty. The patient tolerated the                            procedure well. Scope In: Scope Out: Findings:                 The esophagus was normal.                           Typical post Whipple anatomy. Three endoclips still                            adherent at [place] anastomosis. The underlying ulcer at                            the site is completely healed.                           Affterent and efferent limbs were normal appearing.                           The examined duodenum was normal. Complications:            No immediate complications. Estimated blood loss:                            None. Estimated Blood Loss:     Estimated blood loss: none. Impression:               -  Typical post Whipple anatomy. Three endoclips                            still adherent at [place] anastomosis. The underlying                            ulcer at the site is completely healed.                           - Affterent and efferent limbs were normal                            appearing. Recommendation:           - Patient has a contact number available for                            emergencies. The signs and symptoms of potential                            delayed complications were discussed with the                            patient. Return to normal activities tomorrow.                            Written discharge instructions were provided to the                            patient.                           - Resume previous diet.                           - Continue present medications. Stay on PPI BID and                            iron once daily. [name], MD 03/06/2020 8:43:47  AM This report has been signed electronically.

## 2020-03-06 NOTE — Patient Instructions (Signed)
You may resume your previous diet and medication schedule.  Continue taking your Pantoprazole and Iron.  Thank you for allowing [place] to care for you today!!!   YOU HAD AN ENDOSCOPIC PROCEDURE TODAY AT [facility]:   Refer to the procedure report that was given to you for any specific questions about what was found during the examination.  If the procedure report does not answer your questions, please call your gastroenterologist to clarify.  If you requested that your care partner not be given the details of your procedure findings, then the procedure report has been included in a sealed envelope for you to review at your convenience later.  YOU SHOULD EXPECT: Some feelings of bloating in the abdomen. Passage of more gas than usual.  Walking can help get rid of the air that was put into your GI tract during the procedure and reduce the bloating.   Please Note:  You might notice some irritation and congestion in your nose or some drainage.  This is from the oxygen used during your procedure.  There is no need for concern and it should clear up in a day or so.  SYMPTOMS TO REPORT IMMEDIATELY:   Following upper endoscopy (EGD)  Vomiting of blood or coffee ground material  New chest pain or pain under the shoulder blades  Painful or persistently difficult swallowing  New shortness of breath  Fever of 100F or higher  Black, tarry-looking stools  For urgent or emergent issues, a gastroenterologist can be reached at any hour by calling [phone]. Do not use MyChart messaging for urgent concerns.    DIET:  We do recommend a small meal at first, but then you may proceed to your regular diet.  Drink plenty of fluids but you should avoid alcoholic beverages for 24 hours.  ACTIVITY:  You should plan to take it easy for the rest of today and you should NOT DRIVE or use heavy machinery until tomorrow (because of the sedation medicines used during the test).    FOLLOW UP: Our  staff will call the number listed on your records 48-72 hours following your procedure to check on you and address any questions or concerns that you may have regarding the information given to you following your procedure. If we do not reach you, we will leave a message.  We will attempt to reach you two times.  During this call, we will ask if you have developed any symptoms of COVID 19. If you develop any symptoms (ie: fever, flu-like symptoms, shortness of breath, cough etc.) before then, please call [phone].  If you test positive for Covid 19 in the 2 weeks post procedure, please call and report this information to [place].    If any biopsies were taken you will be contacted by phone or by letter within the next 1-3 weeks.  Please call us at [phone] if you have not heard about the biopsies in 3 weeks.    SIGNATURES/CONFIDENTIALITY: You and/or your care partner have signed paperwork which will be entered into your electronic medical record.  These signatures attest to the fact that that the information above on your After Visit Summary has been reviewed and is understood.  Full responsibility of the confidentiality of this discharge information lies with you and/or your care-partner.

## 2020-03-06 NOTE — Progress Notes (Signed)
A/ox3, pleased with MAC, report to RN 

## 2020-03-06 NOTE — Progress Notes (Signed)
Per office note on 01-21-20- pt was to continue his Plavix.  Last dose 03-05-20  covid test negative 03-04-20

## 2020-03-10 ENCOUNTER — Telehealth: Payer: Self-pay

## 2020-03-10 NOTE — Telephone Encounter (Signed)
°  Follow up Call-  Call back number 03/06/2020  Post procedure Call Back phone  # [phone]  Permission to leave phone message Yes  Some recent data might be hidden     Patient questions:  Do you have a fever, pain , or abdominal swelling? No. Pain Score  0 *  Have you tolerated food without any problems? Yes.    Have you been able to return to your normal activities? Yes.    Do you have any questions about your discharge instructions: Diet   No. Medications  No. Follow up visit  No.  Do you have questions or concerns about your Care? No.  Actions: * If pain score is 4 or above: No action needed, pain <4.  1. Have you developed a fever since your procedure? no  2.   Have you had an respiratory symptoms (SOB or cough) since your procedure? no  3.   Have you tested positive for COVID 19 since your procedure no  4.   Have you had any family members/close contacts diagnosed with the COVID 19 since your procedure?  no   If yes to any of these questions please route to Joylene John, RN and Erenest Rasher, RN

## 2020-03-30 ENCOUNTER — Other Ambulatory Visit: Payer: Self-pay

## 2020-03-30 ENCOUNTER — Encounter: Payer: Self-pay | Admitting: Nurse Practitioner

## 2020-03-30 ENCOUNTER — Telehealth: Payer: Self-pay | Admitting: Oncology

## 2020-03-30 ENCOUNTER — Inpatient Hospital Stay: Payer: Medicare Other

## 2020-03-30 ENCOUNTER — Inpatient Hospital Stay: Payer: Medicare Other | Attending: Oncology | Admitting: Nurse Practitioner

## 2020-03-30 VITALS — BP 125/57 | HR 84 | Temp 97.7°F | Resp 17 | Ht 69.0 in | Wt 132.8 lb

## 2020-03-30 DIAGNOSIS — Z8553 Personal history of malignant neoplasm of renal pelvis: Secondary | ICD-10-CM | POA: Diagnosis not present

## 2020-03-30 DIAGNOSIS — Z8507 Personal history of malignant neoplasm of pancreas: Secondary | ICD-10-CM | POA: Diagnosis not present

## 2020-03-30 DIAGNOSIS — Z87891 Personal history of nicotine dependence: Secondary | ICD-10-CM | POA: Diagnosis not present

## 2020-03-30 DIAGNOSIS — I252 Old myocardial infarction: Secondary | ICD-10-CM | POA: Insufficient documentation

## 2020-03-30 DIAGNOSIS — N289 Disorder of kidney and ureter, unspecified: Secondary | ICD-10-CM | POA: Diagnosis not present

## 2020-03-30 DIAGNOSIS — Z9221 Personal history of antineoplastic chemotherapy: Secondary | ICD-10-CM | POA: Diagnosis not present

## 2020-03-30 DIAGNOSIS — D696 Thrombocytopenia, unspecified: Secondary | ICD-10-CM | POA: Insufficient documentation

## 2020-03-30 DIAGNOSIS — Z905 Acquired absence of kidney: Secondary | ICD-10-CM | POA: Diagnosis not present

## 2020-03-30 DIAGNOSIS — I25119 Atherosclerotic heart disease of native coronary artery with unspecified angina pectoris: Secondary | ICD-10-CM

## 2020-03-30 DIAGNOSIS — D509 Iron deficiency anemia, unspecified: Secondary | ICD-10-CM | POA: Insufficient documentation

## 2020-03-30 DIAGNOSIS — C250 Malignant neoplasm of head of pancreas: Secondary | ICD-10-CM

## 2020-03-30 DIAGNOSIS — I10 Essential (primary) hypertension: Secondary | ICD-10-CM | POA: Diagnosis not present

## 2020-03-30 DIAGNOSIS — I2510 Atherosclerotic heart disease of native coronary artery without angina pectoris: Secondary | ICD-10-CM | POA: Diagnosis not present

## 2020-03-30 DIAGNOSIS — E119 Type 2 diabetes mellitus without complications: Secondary | ICD-10-CM

## 2020-03-30 LAB — CBC WITH DIFFERENTIAL (CANCER CENTER ONLY)
Abs Immature Granulocytes: 0.02 10*3/uL (ref 0.00–0.07)
Basophils Absolute: 0 10*3/uL (ref 0.0–0.1)
Basophils Relative: 0 %
Eosinophils Absolute: 0.1 10*3/uL (ref 0.0–0.5)
Eosinophils Relative: 2 %
HCT: 40 % (ref 39.0–52.0)
Hemoglobin: 12.7 g/dL — ABNORMAL LOW (ref 13.0–17.0)
Immature Granulocytes: 0 %
Lymphocytes Relative: 12 %
Lymphs Abs: 0.9 10*3/uL (ref 0.7–4.0)
MCH: 31 pg (ref 26.0–34.0)
MCHC: 31.8 g/dL (ref 30.0–36.0)
MCV: 97.6 fL (ref 80.0–100.0)
Monocytes Absolute: 0.8 10*3/uL (ref 0.1–1.0)
Monocytes Relative: 12 %
Neutro Abs: 5 10*3/uL (ref 1.7–7.7)
Neutrophils Relative %: 74 %
Platelet Count: 156 10*3/uL (ref 150–400)
RBC: 4.1 MIL/uL — ABNORMAL LOW (ref 4.22–5.81)
RDW: 14.8 % (ref 11.5–15.5)
WBC Count: 6.8 10*3/uL (ref 4.0–10.5)
nRBC: 0 % (ref 0.0–0.2)

## 2020-03-30 NOTE — Progress Notes (Addendum)
[facility] OFFICE PROGRESS NOTE   Diagnosis: Pancreas cancer, iron deficiency anemia  INTERVAL HISTORY:   Manuel Olson returns as scheduled.  He feels "great".  No bleeding.  Bowels moving.  No nausea or vomiting.  He denies pain.  Objective:  Vital signs in last 24 hours:  Blood pressure (!) 125/57, pulse 84, temperature 97.7 F (36.5 C), temperature source Temporal, resp. rate 17, height 5' 9"  (1.753 m), weight 132 lb 12.8 oz (60.2 kg), SpO2 100 %.    HEENT: No thrush or ulcers. Lymphatics: No palpable cervical or supraclavicular lymph nodes. Resp: Lungs clear bilaterally. Cardio: Regular rate and rhythm. GI: Abdomen soft and nontender.  No hepatomegaly.  No mass. Vascular: No leg edema.    Lab Results:  Lab Results  Component Value Date   WBC 6.8 03/30/2020   HGB 12.7 (L) 03/30/2020   HCT 40.0 03/30/2020   MCV 97.6 03/30/2020   PLT 156 03/30/2020   NEUTROABS 5.0 03/30/2020    Imaging:  No results found.  Medications: I have reviewed the patient's current medications.  Assessment/Plan: 1. Adenocarcinoma of the pancreas head/neck, status post an EUS biopsy 08/02/2017 confirming adenocarcinoma ? EUS 08/02/2017-pancreas head/neck mass, abutment of the portal vein, no peripancreatic adenopathy ? MRI abdomen 07/27/2017-head/neck pancreas mass with mass-effect on the portal splenic confluence, no involvement of the celiac axis or superior mesenteric artery, no evidence of metastatic disease ? PET scan 08/31/2017-very hypermetabolism at the pancreatic head mass, no evidence of metastatic disease. The isolated right lower lobe nodule below PET resolution, left sided renal mass with decreased activity relative to the normal adjacent kidney ? Cycle 1 FOLFIRINOX 09/16/2017 ? Cycle 2 FOLFOX 10/16/2017 ? Cycle 3 FOLFOX 10/30/2017 ? Cycle 4 FOLFIRINOX 11/14/2017 (irinotecan resumed with a dose reduction) ? Cycle 5 FOLFIRINOX3/20/2019 (Oxaliplatin dose reduced  due to thrombocytopenia) ? Cycle 6 FOLFIRINOX4/11/2017 ? CTs 12/25/2017-stable right lower lobe nodule, decrease in size of the pancreas head mass, stable left kidney mass ? Pancreaticoduodenectomy and left nephrectomy 02/19/2018,pT1cpN1, 2/18 lymph nodes positive, lymphovascular and perineural invasion present, mild to moderate treatment effect, tumor involved adherent portal vein with negative portal vein margins ? Cycle 1 adjuvant gemcitabine/Abraxane 04/25/2018 ? Cycle 2 adjuvant gemcitabine/Abraxane 05/16/2018 ? Cycle 3 adjuvant gemcitabine/Abraxane 05/31/2018 ? Cycle 4 adjuvant gemcitabine/Abraxane 06/13/2018 ? Cycle 5 adjuvant gemcitabine/Abraxane 06/27/2018 ? Cycle 6 adjuvant gemcitabine/Abraxane 07/11/2018  2. 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  5.Diabetes  6.History of tobacco use  7.Hypertension  8.Port-A-Cath placement 09/15/2017, Dr. Barry Dienes  9. Diarrhea-potentially related to pancreas insufficiency, he began a trial of pancreatic enzyme replacement 05/16/2018: Diarrhea resolved  10. Dehydrationon 03/19/2018-statustreatments with IV fluidsforpast 2 weeks  Admission 04/03/2018 with dehydration and hypotension  Persistent orthostatic hypotension-compression stockings and Florinef added 7/30/2019improved, Florinef discontinued  11.History ofanemia secondary to chemotherapy and chronic disease-progressive 12/26/2019-iron deficiency secondary to a bleeding anastomotic ulcer  12.Renal insufficiency  13.Mild thrombocytopenia-chronic  14.  Hospitalization with GI bleed  12/30/2019 through 01/05/2020.  Upper endoscopy on 01/02/2020 showed an actively bleeding anastomotic ulcer.  Upper endoscopy 03/06/2020-ulcer completely healed.    Disposition: Manuel Olson remains in clinical remission from pancreas cancer.  We will follow-up on the CA 19-9  from today.  We reviewed the CBC from today.  Hemoglobin continues to be improved.  Recent upper endoscopy showed complete healing of the previously noted ulcer.  He will discontinue iron, return for repeat CBC in 2 months.  He will return for lab and follow-up in 2 months.  Patient seen with Dr. Benay Spice.    [name] ANP/GNP-BC   03/30/2020  11:11 AM This was a shared visit with [name].  Manuel Olson is in remission from pancreas cancer.  He declines the COVID-19 vaccine. Iron deficiency anemia has improved. Julieanne Manson, MD

## 2020-03-30 NOTE — Telephone Encounter (Signed)
Scheduled per los. Gave avs and calendar  

## 2020-03-31 LAB — CANCER ANTIGEN 19-9: CA 19-9: 11 U/mL (ref 0–35)

## 2020-04-07 ENCOUNTER — Telehealth: Payer: Self-pay

## 2020-04-07 NOTE — Telephone Encounter (Signed)
FAXED [facility]: [facility]  Document: Medical records request  All above requested information has been faxed successfully to [employer] listed above. Documents and fax confirmation have been placed in the faxed file for future reference.

## 2020-04-17 ENCOUNTER — Other Ambulatory Visit: Payer: Self-pay

## 2020-04-17 ENCOUNTER — Other Ambulatory Visit: Payer: Self-pay | Admitting: Cardiology

## 2020-04-17 ENCOUNTER — Telehealth: Payer: Self-pay | Admitting: Family Medicine

## 2020-04-17 DIAGNOSIS — E1159 Type 2 diabetes mellitus with other circulatory complications: Secondary | ICD-10-CM

## 2020-04-17 DIAGNOSIS — E1165 Type 2 diabetes mellitus with hyperglycemia: Secondary | ICD-10-CM

## 2020-04-17 MED ORDER — INSULIN LISPRO (1 UNIT DIAL) 100 UNIT/ML (KWIKPEN)
5.0000 [IU] | PEN_INJECTOR | Freq: Three times a day (TID) | SUBCUTANEOUS | Status: DC
Start: 2020-04-17 — End: 2020-04-20

## 2020-04-17 NOTE — Progress Notes (Signed)
  Chronic Care Management   Note  04/17/2020 Name: ZYLAN ALMQUIST MRN: [id number] DOB: 10-Feb-1946  LAURENT CARGILE is a 74 y.o. year old male who is a primary care patient of [name], MD. I reached out to [name] by phone today in response to a referral sent by Mr. Raymie Giammarco Akers's PCP, [name], MD.   Mr. Davee was given information about Chronic Care Management services today including:  1. CCM service includes personalized support from designated clinical staff supervised by his physician, including individualized plan of care and coordination with other care providers 2. 24/7 contact phone numbers for assistance for urgent and routine care needs. 3. Service will only be billed when office clinical staff spend 20 minutes or more in a month to coordinate care. 4. Only one practitioner may furnish and bill the service in a calendar month. 5. The patient may stop CCM services at any time (effective at the end of the month) by phone call to the office staff.   Patient agreed to services and verbal consent obtained.   Follow up plan:   Earney [facility] Scheduler

## 2020-04-20 ENCOUNTER — Telehealth: Payer: Self-pay | Admitting: Internal Medicine

## 2020-04-20 DIAGNOSIS — E1159 Type 2 diabetes mellitus with other circulatory complications: Secondary | ICD-10-CM

## 2020-04-20 DIAGNOSIS — E1165 Type 2 diabetes mellitus with hyperglycemia: Secondary | ICD-10-CM

## 2020-04-20 MED ORDER — INSULIN LISPRO (1 UNIT DIAL) 100 UNIT/ML (KWIKPEN): 5.0000 [IU] | PEN_INJECTOR | Freq: Three times a day (TID) | SUBCUTANEOUS | 2 refills | Status: DC

## 2020-04-20 NOTE — Telephone Encounter (Signed)
Rx changed and sent. 

## 2020-04-20 NOTE — Telephone Encounter (Signed)
Medication Refill Request  Did you call your pharmacy and request this refill first? Yes - see the refill on the patient's med list but it says "no print"   If patient has not contacted pharmacy first, instruct them to do so for future refills.   Remind them that contacting the pharmacy for their refill is the quickest method to get the refill.   Refill policy also stated that it will take anywhere between 24-72 hours to receive the refill.    Name of medication? Humalog  Is this a 90 day supply? yes  Name and location of pharmacy?   [place], [place] Phone:  [phone]  Fax:  [phone]

## 2020-05-06 ENCOUNTER — Other Ambulatory Visit: Payer: Self-pay | Admitting: Gastroenterology

## 2020-05-20 ENCOUNTER — Other Ambulatory Visit: Payer: Self-pay | Admitting: Cardiology

## 2020-05-21 ENCOUNTER — Encounter: Payer: Self-pay | Admitting: Internal Medicine

## 2020-05-21 ENCOUNTER — Ambulatory Visit (INDEPENDENT_AMBULATORY_CARE_PROVIDER_SITE_OTHER): Payer: Medicare Other | Admitting: Internal Medicine

## 2020-05-21 ENCOUNTER — Other Ambulatory Visit: Payer: Self-pay

## 2020-05-21 VITALS — BP 120/70 | HR 88 | Ht 69.0 in | Wt 132.0 lb

## 2020-05-21 DIAGNOSIS — E1165 Type 2 diabetes mellitus with hyperglycemia: Secondary | ICD-10-CM

## 2020-05-21 DIAGNOSIS — E46 Unspecified protein-calorie malnutrition: Secondary | ICD-10-CM | POA: Diagnosis not present

## 2020-05-21 DIAGNOSIS — I25119 Atherosclerotic heart disease of native coronary artery with unspecified angina pectoris: Secondary | ICD-10-CM | POA: Diagnosis not present

## 2020-05-21 DIAGNOSIS — E1159 Type 2 diabetes mellitus with other circulatory complications: Secondary | ICD-10-CM

## 2020-05-21 DIAGNOSIS — E782 Mixed hyperlipidemia: Secondary | ICD-10-CM

## 2020-05-21 LAB — POCT GLYCOSYLATED HEMOGLOBIN (HGB A1C): Hemoglobin A1C: 7.2 % — AB (ref 4.0–5.6)

## 2020-05-21 NOTE — Addendum Note (Signed)
Addended by: Cardell Peach I on: 05/21/2020 10:46 AM   Modules accepted: Orders

## 2020-05-21 NOTE — Patient Instructions (Addendum)
Please continue: - Basaglar 11 units in a.m. - Humalog 3-6 units before the 3 meals, even if the sugars before the meal are normal  Please call and schedule an eye appt: [facility]:  Dr. Sherlyn Lick MD or colleagues Address: [address], [place], [place] [address]  [phone]  Please return in 4 months with your sugar log.

## 2020-05-21 NOTE — Progress Notes (Signed)
Patient ID: Manuel Olson, male   DOB: [birth year], 74 y.o.   MRN: [id number]   This visit occurred during the SARS-CoV-2 public health emergency.  Safety protocols were in place, including screening questions prior to the visit, additional usage of staff PPE, and extensive cleaning of exam room while observing appropriate contact time as indicated for disinfecting solutions.    HPI: Manuel Olson is a 74 y.o.-year-old male, initially referred by his oncologist, Dr. Benay Spice, presenting for follow-up for DM2, dx in 01/2016, insulin-dependent, uncontrolled, with complications (CAD - s/p AMI 11/2016 - s/p 4x DES; Ao stenosis). He saw Dr. Dorris Fetch before.  Last visit 4 months ago.  Of note, patient has a significant history of pancreatic cancer diagnosed 07/2017.  He started chemotherapy 09/2017.  Since then, he also had a kidney removed for renal cell carcinoma and at that time he had pancreatic resection (head of the pancreas in 02/2018.  He was found to have metastases in his lymph nodes.  He restarted chemotherapy after the surgery but finished 06/2018.  Reviewed HbA1c levels: Lab Results  Component Value Date   HGBA1C 6.5 (A) 09/17/2019   HGBA1C 7.3 (A) 05/14/2019   HGBA1C 7.6 (A) 10/12/2018   His insulin production after pancreatic resection was low: Component     Latest Ref Rng & Units 10/12/2018  C-Peptide     0.80 - 3.85 ng/mL 0.53 (L)  Glucose, Plasma     65 - 99 mg/dL 168 (H)   Pt was on a regimen of: - Glipizide 5 mg daily  - Tresiba 20 units at bedtime (samples, cannot afford this)    He is currently on:  - Basaglar 11 units in a.m. - Humalog 3-6 units before the 3 meals, even if the sugars before the meal are normal  He checks his sugars more than 4 times a day with his CGM:    Previously:   - Average: 153 >> 146 >> 126 - % active CGM time: 86% >> 72% >> 88% of the time - Glucose variability 34.7 >> 26.7% >> 33.9% (target < or = to 36%) - time in range:  -  very low (<54): 0% >> 0% >> 0% - low (54-69): 1% >> 1% >> 3% - normal range (70-180): 72% >> 81% >> 85% - high sugars (181-250): 20% >> 16% >> 11% - very high sugars (>250): 7% >> 2% >> 1%  Lowest sugar was  42 (libre) >> 86 >> 67 >> 60 >> 48 >> 49; it is unclear at which level he has hypoglycemia awareness. Highest sugar was 337 >> 292 >> 379>> 300s >> 200s.  Glucometer: True Metrix  Pt's meals are: - Breakfast: bacon + eggs + English muffin + occas. Tomato juice   - Lunch: grilled ham and cheese sandwich + potato chips - Dinner: chicken + veggies + starch - Snacks: fruit cup, pudding, fig newtons, icecreams  + CKD, last BUN/creatinine:  Lab Results  Component Value Date   BUN 28 (H) 01/09/2020   BUN 54 (H) 01/01/2020   CREATININE 1.67 (H) 01/09/2020   CREATININE 1.85 (H) 01/01/2020  On Cozaar. + HL; last set of lipids: Lab Results  Component Value Date   CHOL 92 10/09/2019   HDL 49.70 10/09/2019   LDLCALC 28 10/09/2019   TRIG 71.0 10/09/2019   CHOLHDL 2 10/09/2019  On Lipitor. - last eye exam was in 07/2018: No DR. 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  He has had laser surgeries  in the past. - no numbness but has tingling in his feet  Pt has FH of DM in brother.  He also has a history of HTN.  ROS: Constitutional: no weight gain/no weight loss, no fatigue, no subjective hyperthermia, no subjective hypothermia Eyes: no blurry vision, no xerophthalmia ENT: no sore throat, no nodules palpated in neck, no dysphagia, no odynophagia, no hoarseness Cardiovascular: no CP/no SOB/no palpitations/no leg swelling Respiratory: no cough/no SOB/no wheezing Gastrointestinal: no N/no V/no D/no C/no acid reflux Musculoskeletal: no muscle aches/no joint aches Skin: no rashes, no hair loss Neurological: no tremors/no numbness/no tingling/no dizziness  I reviewed pt's medications, allergies, PMH, social hx, family hx, and changes were documented in the history of present illness. Otherwise,  unchanged from my initial visit note.  Past Medical History:  Diagnosis Date  . Arthritis    back  . Blood transfusion without reported diagnosis   . CAD (coronary artery disease)    a. 11/2016 in [place]: STEMI s/p DES to mid LAD, DES to diagonal, DES x 2 to proximal and mid RCA b. 08/2019: NSTEMI with DES to distal RCA  . Essential hypertension   . Full dentures   . GERD (gastroesophageal reflux disease)   . Heart murmur   . History of stroke   . Hyperlipidemia   . Myocardial infarction [facility])    "couple of years ago"  . Pancreatic cancer ([place])   . Pneumonia   . Renal cell carcinoma ([place])   . S/P TAVR (transcatheter aortic valve replacement)    a. 08/2017:  Edwards Sapien 3 THV (size 26 mm, model #9600CM26A , serial # [id number])  . Severe aortic stenosis    a. 08/2017: s/p TAVR by Dr. Angelena Form and Dr. Cyndia Bent.   . Stroke [facility])    "few years ago per pt"  . Type 2 diabetes mellitus ([place])   . Wears glasses   . Wears hearing aid    Past Surgical History:  Procedure Laterality Date  . APPENDECTOMY    . CARDIAC CATHETERIZATION    . CORONARY ANGIOGRAPHY N/A 08/23/2019   Procedure: CORONARY ANGIOGRAPHY;  Surgeon: Belva Crome, MD;  Location: [place] CV LAB;  Service: Cardiovascular;  Laterality: N/A;  . CORONARY STENT INTERVENTION N/A 08/23/2019   Procedure: CORONARY STENT INTERVENTION;  Surgeon: Belva Crome, MD;  Location: [place] CV LAB;  Service: Cardiovascular;  Laterality: N/A;  . CORONARY STENT PLACEMENT     x 4 in March 2018  . ESOPHAGOGASTRODUODENOSCOPY (EGD) WITH PROPOFOL N/A 01/02/2020   Procedure: ESOPHAGOGASTRODUODENOSCOPY (EGD) WITH PROPOFOL;  Surgeon: Jackquline Denmark, MD;  Location: [facility];  Service: Endoscopy;  Laterality: N/A;  . EUS N/A 08/02/2017   Procedure: UPPER ENDOSCOPIC ULTRASOUND (EUS) RADIAL;  Surgeon: [name], MD;  Location: [facility];  Service: Endoscopy;  Laterality: N/A;  . EYE SURGERY Left   . HAND SURGERY Bilateral   .  HEMOSTASIS CLIP PLACEMENT  01/02/2020   Procedure: HEMOSTASIS CLIP PLACEMENT;  Surgeon: Jackquline Denmark, MD;  [facility];  Service: Endoscopy;;  . HERNIA REPAIR Right   . HOT HEMOSTASIS N/A 01/02/2020   Procedure: HOT HEMOSTASIS (ARGON PLASMA COAGULATION/BICAP);  Surgeon: Jackquline Denmark, MD;  Location: [facility];  Service: Endoscopy;  Laterality: N/A;  . LAPAROSCOPY N/A 02/19/2018   Procedure: DIAGNOSTIC LAPAROSCOPY, ERAS PATHWAY;  Surgeon: [name], MD;  Location: [place];  Service: General;  Laterality: N/A;  . MULTIPLE TOOTH EXTRACTIONS    . NEPHRECTOMY Left 02/19/2018   Procedure: OPEN LEFT  RADICAL NEPHRECTOMY;  Surgeon: Cleon Gustin, MD;  Location: [place];  Service: Urology;  Laterality: Left;  . PORT-A-CATH REMOVAL N/A 06/13/2019   Procedure: PORT REMOVAL;  Surgeon: [name], MD;  Location: [place];  Service: General;  Laterality: N/A;  . PORTACATH PLACEMENT N/A 09/15/2017   Procedure: INSERTION PORT-A-CATH;  Surgeon: [name], MD;  Location: [place];  Service: General;  Laterality: N/A;  . RIGHT/LEFT HEART CATH AND CORONARY ANGIOGRAPHY N/A 07/05/2017   Procedure: RIGHT/LEFT HEART CATH AND CORONARY ANGIOGRAPHY;  Surgeon: [name], MD;  Location: [place] CV LAB;  Service: Cardiovascular;  Laterality: N/A;  . SCLEROTHERAPY  01/02/2020   Procedure: SCLEROTHERAPY;  Surgeon: Jackquline Denmark, MD;  [facility];  Service: Endoscopy;;  . SHOULDER ARTHROSCOPY WITH ROTATOR CUFF REPAIR Left   . TEE WITHOUT CARDIOVERSION N/A 08/22/2017   Procedure: TRANSESOPHAGEAL ECHOCARDIOGRAM (TEE);  Surgeon: Burnell Blanks, MD;  Location: [place];  Service: Open Heart Surgery;  Laterality: N/A;  . TRANSCATHETER AORTIC VALVE REPLACEMENT, TRANSFEMORAL N/A 08/22/2017   Procedure: TRANSCATHETER AORTIC VALVE REPLACEMENT, TRANSFEMORAL;  Surgeon: Burnell Blanks, MD;  Location: [place];  Service: Open Heart Surgery;  Laterality: N/A;  . UPPER  GASTROINTESTINAL ENDOSCOPY    . WHIPPLE PROCEDURE N/A 02/19/2018   Procedure: WHIPPLE PROCEDURE;  Surgeon: [name], MD;  Location: [place];  Service: General;  Laterality: N/A;   Social History   Socioeconomic History  . Marital status: Married    Spouse name: Not on file  . Number of children: Not on file  Social Needs  Occupational History  .  Retired  Tobacco Use  . Smoking status: Former Smoker    Packs/day: 1.00    Years: 57.00    Pack years: 57.00    Types: Cigarettes    Start date: 09/12/1958    Last attempt to quit: 2018    Years since quitting: 1.5  . Smokeless tobacco: Never Used  Substance and Sexual Activity  . Alcohol use: No  . Drug use: No   Current Outpatient Medications on File Prior to Visit  Medication Sig Dispense Refill  . atorvastatin (LIPITOR) 20 MG tablet TAKE 1 TABLET BY MOUTH ONCE DAILY. 90 tablet 3  . clopidogrel (PLAVIX) 75 MG tablet TAKE 1 TABLET BY MOUTH ONCE DAILY WITH BREAKFAST. 90 tablet 3  . CREON 36000 units CPEP capsule TAKE 1 CAPSULE BY MOUTH BEFORE EACH MEAL AND SNACK. TAKE UP TO 6 CAPSULES PER DAY. 90 capsule 0  . ferrous sulfate 325 (65 FE) MG tablet Take 325 mg by mouth in the morning and at bedtime.    . Insulin Glargine (BASAGLAR KWIKPEN) 100 UNIT/ML Inject 0.11 mLs (11 Units total) into the skin at bedtime. (Patient taking differently: Inject 11 Units into the skin daily. In morning) 5 pen 5  . insulin lispro (HUMALOG KWIKPEN) 100 UNIT/ML KwikPen Inject 0.05-0.08 mLs (5-8 Units total) into the skin 3 (three) times daily before meals. Inject 5-8 units 3 times a day with meals. 21.6 mL 2  . Insulin Pen Needle (PEN NEEDLES) 31G X 6 MM MISC Use as directed to check blood sugar 4 times a day. 400 each 2  . pantoprazole (PROTONIX) 40 MG tablet TAKE 1 TABLET BY MOUTH TWICE DAILY 60 tablet 5   No current facility-administered medications on file prior to visit.   No Known Allergies Family History  Problem Relation Age of Onset  . Lupus  Mother   . CAD Brother   .  Hypertension Brother   . Colon cancer Neg Hx   . Esophageal cancer Neg Hx   . Rectal cancer Neg Hx   . Stomach cancer Neg Hx     PE: BP 120/70   Pulse 88   Ht 5' 9"  (1.753 m)   Wt 132 lb (59.9 kg)   SpO2 98%   BMI 19.49 kg/m  Wt Readings from Last 3 Encounters:  05/21/20 132 lb (59.9 kg)  03/30/20 132 lb 12.8 oz (60.2 kg)  03/06/20 131 lb (59.4 kg)   Constitutional: Under weight, in NAD Eyes: PERRLA, EOMI, no exophthalmos ENT: moist mucous membranes, no thyromegaly, no cervical lymphadenopathy Cardiovascular: RRR, No MRG Respiratory: CTA B Gastrointestinal: abdomen soft, NT, ND, BS+ Musculoskeletal: no deformities, strength intact in all 4 Skin: moist, warm, no rashes Neurological: no tremor with outstretched hands, DTR normal in all 4  ASSESSMENT: 1. DM2, non-insulin-dependent, uncontrolled, with complications - CAD - s/p AMI 11/2016 - s/p DES x4 -  Ao stenosis  2. HL  3.  Protein caloric malnutrition  PLAN:  1. Patient with history of initially controlled diabetes with worsening Control after his pancreatic cancer diagnosis in 2018 with subsequent pancreatectomy.  He has low insulin production so he is on a basal-bolus insulin regimen.  At last visit, HbA1c was slightly higher in the morning and they were increasing later in the day.  We increased his morning Basaglar at that time.  I also advised him that he may need lower doses of Humalog after increasing the Basaglar.  He was not bolusing for meals if the sugars were normal before meals and I advised him to do so. CGM interpretation: -At this visit, I reviewed his CGM along with the patient and his wife.  The sugars in the first part of the day improved, but they are higher after dinner.  Upon questioning, he is taking the lower dose of insulin before dinner and with possibly taking up to 6 units.  He agrees to do so.  Sugars are also higher after breakfast, although not as high after  dinner.  Discussed about the composition of his breakfast, which is higher glycemic index.  I advised him to add healthy fats to delay the absorption of breakfast carbs.  Otherwise, I do not feel that he needs an increase in insulin with this meal.  Also, after lunch, sugars, but still in target range.  Overall, he is in range 85% of the time and has less than 3% of sugars in the lower range.  His glucose management indicator for the last 2 weeks is 6.3%, which is excellent. - I suggested to:  Patient Instructions  Please continue: - Basaglar 11 units in a.m. - Humalog 3-6 units before the 3 meals, even if the sugars before the meal are normal  Please call and schedule an eye appt: [facility]:  Dr. Sherlyn Lick MD or colleagues Address: [address], [place],  [address]  [phone]  Please return in 4 months with your sugar log.   - we checked his HbA1c: 7.2% (higher) - advised to check sugars at different times of the day - 4x a day, rotating check times - advised for yearly eye exams >> he is not UTD - return to clinic in 4 months  2. HL -Reviewed latest labs from 09/2019: All lipid fractions at goal Lab Results  Component Value Date   CHOL 92 10/09/2019   HDL 49.70 10/09/2019   LDLCALC 28 10/09/2019  TRIG 71.0 10/09/2019   CHOLHDL 2 10/09/2019  -Continues Lipitor without side effects  3.  Protein caloric malnutrition - wt stable since last visit -He continues to try to gain weight -We continue with insulin, which has an anabolic effect  Philemon Kingdom, MD PhD [facility] Endocrinology

## 2020-05-26 ENCOUNTER — Other Ambulatory Visit: Payer: Self-pay

## 2020-05-26 ENCOUNTER — Inpatient Hospital Stay: Payer: Medicare Other

## 2020-05-26 ENCOUNTER — Inpatient Hospital Stay: Payer: Medicare Other | Attending: Oncology | Admitting: Oncology

## 2020-05-26 ENCOUNTER — Telehealth: Payer: Self-pay

## 2020-05-26 ENCOUNTER — Telehealth: Payer: Self-pay | Admitting: Oncology

## 2020-05-26 VITALS — BP 140/68 | HR 75 | Temp 97.8°F | Resp 20 | Ht 69.0 in | Wt 133.0 lb

## 2020-05-26 DIAGNOSIS — I2510 Atherosclerotic heart disease of native coronary artery without angina pectoris: Secondary | ICD-10-CM | POA: Diagnosis not present

## 2020-05-26 DIAGNOSIS — I252 Old myocardial infarction: Secondary | ICD-10-CM | POA: Diagnosis not present

## 2020-05-26 DIAGNOSIS — Z8507 Personal history of malignant neoplasm of pancreas: Secondary | ICD-10-CM | POA: Diagnosis not present

## 2020-05-26 DIAGNOSIS — Z9221 Personal history of antineoplastic chemotherapy: Secondary | ICD-10-CM | POA: Insufficient documentation

## 2020-05-26 DIAGNOSIS — I25119 Atherosclerotic heart disease of native coronary artery with unspecified angina pectoris: Secondary | ICD-10-CM

## 2020-05-26 DIAGNOSIS — C250 Malignant neoplasm of head of pancreas: Secondary | ICD-10-CM

## 2020-05-26 DIAGNOSIS — D696 Thrombocytopenia, unspecified: Secondary | ICD-10-CM | POA: Diagnosis not present

## 2020-05-26 DIAGNOSIS — Z87891 Personal history of nicotine dependence: Secondary | ICD-10-CM | POA: Diagnosis not present

## 2020-05-26 DIAGNOSIS — Z905 Acquired absence of kidney: Secondary | ICD-10-CM | POA: Insufficient documentation

## 2020-05-26 DIAGNOSIS — E119 Type 2 diabetes mellitus without complications: Secondary | ICD-10-CM | POA: Diagnosis not present

## 2020-05-26 DIAGNOSIS — Z8553 Personal history of malignant neoplasm of renal pelvis: Secondary | ICD-10-CM | POA: Diagnosis not present

## 2020-05-26 DIAGNOSIS — N289 Disorder of kidney and ureter, unspecified: Secondary | ICD-10-CM | POA: Diagnosis not present

## 2020-05-26 DIAGNOSIS — I10 Essential (primary) hypertension: Secondary | ICD-10-CM | POA: Diagnosis not present

## 2020-05-26 LAB — CBC WITH DIFFERENTIAL (CANCER CENTER ONLY)
Abs Immature Granulocytes: 0.02 10*3/uL (ref 0.00–0.07)
Basophils Absolute: 0 10*3/uL (ref 0.0–0.1)
Basophils Relative: 1 %
Eosinophils Absolute: 0.1 10*3/uL (ref 0.0–0.5)
Eosinophils Relative: 2 %
HCT: 37.9 % — ABNORMAL LOW (ref 39.0–52.0)
Hemoglobin: 11.9 g/dL — ABNORMAL LOW (ref 13.0–17.0)
Immature Granulocytes: 0 %
Lymphocytes Relative: 15 %
Lymphs Abs: 1 10*3/uL (ref 0.7–4.0)
MCH: 30.3 pg (ref 26.0–34.0)
MCHC: 31.4 g/dL (ref 30.0–36.0)
MCV: 96.4 fL (ref 80.0–100.0)
Monocytes Absolute: 0.8 10*3/uL (ref 0.1–1.0)
Monocytes Relative: 12 %
Neutro Abs: 4.6 10*3/uL (ref 1.7–7.7)
Neutrophils Relative %: 70 %
Platelet Count: 136 10*3/uL — ABNORMAL LOW (ref 150–400)
RBC: 3.93 MIL/uL — ABNORMAL LOW (ref 4.22–5.81)
RDW: 14.2 % (ref 11.5–15.5)
WBC Count: 6.5 10*3/uL (ref 4.0–10.5)
nRBC: 0 % (ref 0.0–0.2)

## 2020-05-26 LAB — FERRITIN: Ferritin: 42 ng/mL (ref 24–336)

## 2020-05-26 NOTE — Telephone Encounter (Signed)
Spoke with patients wife made aware of lab results and new instructions to restart iron wife understand no questions or concerns voiced call ended

## 2020-05-26 NOTE — Telephone Encounter (Signed)
Scheduled per 9/14. Printed avs and calendar for pt.

## 2020-05-26 NOTE — Progress Notes (Signed)
[place] OFFICE PROGRESS NOTE   Diagnosis: Pancreas cancer  INTERVAL HISTORY:   Manuel Olson reports feeling well.  Good appetite.  Occasional diarrhea.  No dizzy episodes.  No complaint.  Objective:  Vital signs in last 24 hours:  Blood pressure (!) 175/93, pulse 75, temperature 97.8 F (36.6 C), temperature source Tympanic, resp. rate 20, height 5' 9"  (1.753 m), weight 133 lb (60.3 kg), SpO2 98 %.  Lymphatics: No cervical, supraclavicular, axillary, or inguinal nodes Resp: Lungs clear bilaterally, no respiratory distress Cardio: Regular rate and rhythm GI: No hepatosplenomegaly, nontender, no mass Vascular: No leg edema   Lab Results:  Lab Results  Component Value Date   WBC 6.5 05/26/2020   HGB 11.9 (L) 05/26/2020   HCT 37.9 (L) 05/26/2020   MCV 96.4 05/26/2020   PLT 136 (L) 05/26/2020   NEUTROABS 4.6 05/26/2020    CMP  Lab Results  Component Value Date   NA 137 01/09/2020   K 4.4 01/09/2020   CL 104 01/09/2020   CO2 27 01/09/2020   GLUCOSE 123 (H) 01/09/2020   BUN 28 (H) 01/09/2020   CREATININE 1.67 (H) 01/09/2020   CALCIUM 8.5 (L) 01/09/2020   PROT 5.6 (L) 01/09/2020   ALBUMIN 2.8 (L) 01/09/2020   AST 16 01/09/2020   ALT 14 01/09/2020   ALKPHOS 100 01/09/2020   BILITOT 0.2 (L) 01/09/2020   GFRNONAA 39 (L) 01/09/2020   GFRAA 45 (L) 01/09/2020    Medications: I have reviewed the patient's current medications.   Assessment/Plan: 1. Adenocarcinoma of the pancreas head/neck, status post an EUS biopsy 08/02/2017 confirming adenocarcinoma ? EUS 08/02/2017-pancreas head/neck mass, abutment of the portal vein, no peripancreatic adenopathy ? MRI abdomen 07/27/2017-head/neck pancreas mass with mass-effect on the portal splenic confluence, no involvement of the celiac axis or superior mesenteric artery, no evidence of metastatic disease ? PET scan 08/31/2017-very hypermetabolism at the pancreatic head mass, no evidence of metastatic  disease. The isolated right lower lobe nodule below PET resolution, left sided renal mass with decreased activity relative to the normal adjacent kidney ? Cycle 1 FOLFIRINOX 09/16/2017 ? Cycle 2 FOLFOX 10/16/2017 ? Cycle 3 FOLFOX 10/30/2017 ? Cycle 4 FOLFIRINOX 11/14/2017 (irinotecan resumed with a dose reduction) ? Cycle 5 FOLFIRINOX3/20/2019 (Oxaliplatin dose reduced due to thrombocytopenia) ? Cycle 6 FOLFIRINOX4/11/2017 ? CTs 12/25/2017-stable right lower lobe nodule, decrease in size of the pancreas head mass, stable left kidney mass ? Pancreaticoduodenectomy and left nephrectomy 02/19/2018,[id number], 2/18 lymph nodes positive, lymphovascular and perineural invasion present, mild to moderate treatment effect, tumor involved adherent portal vein with negative portal vein margins ? Cycle 1 adjuvant gemcitabine/Abraxane 04/25/2018 ? Cycle 2 adjuvant gemcitabine/Abraxane 05/16/2018 ? Cycle 3 adjuvant gemcitabine/Abraxane 05/31/2018 ? Cycle 4 adjuvant gemcitabine/Abraxane 06/13/2018 ? Cycle 5 adjuvant gemcitabine/Abraxane 06/27/2018 ? Cycle 6 adjuvant gemcitabine/Abraxane 07/11/2018  2. 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  5.Diabetes  6.History of tobacco use  7.Hypertension  8.Port-A-Cath placement 09/15/2017, Dr. Barry Dienes  9. Diarrhea-potentially related to pancreas insufficiency, he began a trial of pancreatic enzyme replacement 05/16/2018: Diarrhea resolved  10. 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  Hospitalization with GI bleed 12/30/2019 through 01/05/2020.  Upper endoscopy on 01/02/2020 showed an actively bleeding anastomotic ulcer.  Upper endoscopy 03/06/2020-ulcer completely healed.      Disposition: Manuel Olson is in clinical remission from pancreas cancer.  We will follow up on the CA 19-9 from today.  The hemoglobin is slightly lower today.  He is no longer taking iron.  We will follow up on the ferritin level from today.  He will return for an office and lab visit in 3 months.  Manuel Olson declines the COVID-19 vaccine.  [name], MD  05/26/2020  11:03 AM

## 2020-05-26 NOTE — Telephone Encounter (Signed)
-----   Message from Ladell Pier, MD sent at 05/26/2020  1:48 PM EDT ----- Please call patient, iron and hemoglobin are in the low normal range, resume ferrous sulfate once daily, follow-up as scheduled, call for symptoms of anemia

## 2020-05-27 ENCOUNTER — Ambulatory Visit: Payer: Medicare Other

## 2020-05-27 ENCOUNTER — Telehealth: Payer: Self-pay | Admitting: *Deleted

## 2020-05-27 DIAGNOSIS — E119 Type 2 diabetes mellitus without complications: Secondary | ICD-10-CM

## 2020-05-27 DIAGNOSIS — Z794 Long term (current) use of insulin: Secondary | ICD-10-CM

## 2020-05-27 DIAGNOSIS — E782 Mixed hyperlipidemia: Secondary | ICD-10-CM

## 2020-05-27 DIAGNOSIS — I25119 Atherosclerotic heart disease of native coronary artery with unspecified angina pectoris: Secondary | ICD-10-CM

## 2020-05-27 DIAGNOSIS — I10 Essential (primary) hypertension: Secondary | ICD-10-CM

## 2020-05-27 LAB — CANCER ANTIGEN 19-9: CA 19-9: 12 U/mL (ref 0–35)

## 2020-05-27 NOTE — Progress Notes (Signed)
I have collaborated with the care management provider regarding care management and care coordination activities outlined in this encounter and have reviewed this encounter including documentation in the note and care plan. I am certifying that I agree with the content of this note and encounter as supervising physician.  

## 2020-05-27 NOTE — Progress Notes (Signed)
Chronic Care Management Pharmacy  Name: Manuel Olson  MRN: [id number] DOB: Jul 03, 1946  Chief Complaint/ HPI  Manuel Olson,  74 y.o., male presents for their Initial CCM visit with the clinical pharmacist via telephone due to COVID-19 Pandemic. Accompanied by wife, [name].   PCP : [name], MD  Chronic conditions include:  Encounter Diagnoses  Name Primary?  . Mixed hyperlipidemia Yes  . Coronary artery disease involving native coronary artery of native heart with angina pectoris ([place])   . Essential hypertension, benign   . Insulin dependent type 2 diabetes mellitus (HCC)     Iron/TIBC/Ferritin/ %Sat    Component Value Date/Time   IRON 12 (L) 12/30/2019 2008   TIBC 379 12/30/2019 2008   FERRITIN 42 05/26/2020 1003   IRONPCTSAT 3 (L) 12/30/2019 2008    Office Visits:  10/09/2019: no changes.  Consult Visit: 05/27/2020 (Dr Learta Codding, oncology): clinical remission from pancreas cancer. Ferritin lower end of normal, encouraged to resume iron therapy.  05/21/2020 (Dr Cruzita Lederer): continue basaglar 11 units qam, humalog 3-6 units before 3 meals even if sugars normal; schedule eye appt, 4 month f/u. Patient Active Problem List   Diagnosis Date Noted  . Anastomotic ulcer 01/21/2020  . Long term (current) use of antithrombotics/antiplatelets 01/21/2020  . Upper gastrointestinal bleed 12/31/2019  . Acute GI bleeding 12/30/2019  . Angina pectoris ([place]) 08/22/2019  . Protein-calorie malnutrition, severe 04/05/2018  . Debility 02/27/2018  . Port-A-Cath in place 10/10/2017  . Renal cell carcinoma of right kidney [facility]) s/p nephrectomy 02/2018 10/02/2017  . Ileitis, terminal ([place]) 09/28/2017  . Insulin dependent type 2 diabetes mellitus ([place]) 08/25/2017  . Mixed hyperlipidemia 08/25/2017  . Essential hypertension, benign   . CAD (coronary artery disease)   . Former smoker   . Severe AS S/P TAVR  08/22/2017  . Cancer of head of pancreas [facility]) s/p [place] 02/2018  08/10/2017   Past Surgical History:  Procedure Laterality Date  . APPENDECTOMY    . CARDIAC CATHETERIZATION    . CORONARY ANGIOGRAPHY N/A 08/23/2019   Procedure: CORONARY ANGIOGRAPHY;  Surgeon: Belva Crome, MD;  Location: [place] CV [facility];  Service: Cardiovascular;  Laterality: N/A;  . CORONARY STENT INTERVENTION N/A 08/23/2019   Procedure: CORONARY STENT INTERVENTION;  Surgeon: Belva Crome, MD;  Location: [place] [facility];  Service: Cardiovascular;  Laterality: N/A;  . CORONARY STENT PLACEMENT     x 4 in March 2018  . ESOPHAGOGASTRODUODENOSCOPY (EGD) WITH PROPOFOL N/A 01/02/2020   Procedure: ESOPHAGOGASTRODUODENOSCOPY (EGD) WITH PROPOFOL;  Surgeon: Jackquline Denmark, MD;  [facility];  Service: Endoscopy;  Laterality: N/A;  . EUS N/A 08/02/2017   Procedure: UPPER ENDOSCOPIC ULTRASOUND (EUS) RADIAL;  Surgeon: [name], MD;  Location: [facility];  Service: Endoscopy;  Laterality: N/A;  . EYE SURGERY Left   . HAND SURGERY Bilateral   . HEMOSTASIS CLIP PLACEMENT  01/02/2020   Procedure: HEMOSTASIS CLIP PLACEMENT;  Surgeon: Jackquline Denmark, MD;  [facility];  Service: Endoscopy;;  . HERNIA REPAIR Right   . HOT HEMOSTASIS N/A 01/02/2020   Procedure: HOT HEMOSTASIS (ARGON PLASMA COAGULATION/BICAP);  Surgeon: Jackquline Denmark, MD;  Location: [facility];  Service: Endoscopy;  Laterality: N/A;  . LAPAROSCOPY N/A 02/19/2018   Procedure: DIAGNOSTIC LAPAROSCOPY, ERAS PATHWAY;  Surgeon: [name], MD;  Location: [place];  Service: General;  Laterality: N/A;  . MULTIPLE TOOTH EXTRACTIONS    . NEPHRECTOMY Left 02/19/2018   Procedure: OPEN LEFT RADICAL NEPHRECTOMY;  Surgeon: Alyson Ingles,  Candee Furbish, MD;  Location: [place];  Service: Urology;  Laterality: Left;  . PORT-A-CATH REMOVAL N/A 06/13/2019   Procedure: PORT REMOVAL;  Surgeon: [name], MD;  Location: [place];  Service: General;  Laterality: N/A;  . PORTACATH PLACEMENT N/A 09/15/2017   Procedure: INSERTION PORT-A-CATH;   Surgeon: [name], MD;  Location: [place];  Service: General;  Laterality: N/A;  . RIGHT/LEFT HEART CATH AND CORONARY ANGIOGRAPHY N/A 07/05/2017   Procedure: RIGHT/LEFT HEART CATH AND CORONARY ANGIOGRAPHY;  Surgeon: [name], MD;  Location: [place] [facility];  Service: Cardiovascular;  Laterality: N/A;  . SCLEROTHERAPY  01/02/2020   Procedure: SCLEROTHERAPY;  Surgeon: Jackquline Denmark, MD;  [facility];  Service: Endoscopy;;  . SHOULDER ARTHROSCOPY WITH ROTATOR CUFF REPAIR Left   . TEE WITHOUT CARDIOVERSION N/A 08/22/2017   Procedure: TRANSESOPHAGEAL ECHOCARDIOGRAM (TEE);  Surgeon: Burnell Blanks, MD;  Location: [place];  Service: Open Heart Surgery;  Laterality: N/A;  . TRANSCATHETER AORTIC VALVE REPLACEMENT, TRANSFEMORAL N/A 08/22/2017   Procedure: TRANSCATHETER AORTIC VALVE REPLACEMENT, TRANSFEMORAL;  Surgeon: Burnell Blanks, MD;  Location: [place];  Service: Open Heart Surgery;  Laterality: N/A;  . UPPER GASTROINTESTINAL ENDOSCOPY    . WHIPPLE PROCEDURE N/A 02/19/2018   Procedure: WHIPPLE PROCEDURE;  Surgeon: [name], MD;  Location: [facility] OR;  Service: General;  Laterality: N/A;   Family History  Problem Relation Age of Onset  . Lupus Mother   . CAD Brother   . Hypertension Brother   . Colon cancer Neg Hx   . Esophageal cancer Neg Hx   . Rectal cancer Neg Hx   . Stomach cancer Neg Hx    No Known Allergies Outpatient Encounter Medications as of 05/27/2020  Medication Sig  . atorvastatin (LIPITOR) 20 MG tablet TAKE 1 TABLET BY MOUTH ONCE DAILY.  [name] clopidogrel (PLAVIX) 75 MG tablet TAKE 1 TABLET BY MOUTH ONCE DAILY WITH BREAKFAST.  [name] CREON 36000 units CPEP capsule TAKE 1 CAPSULE BY MOUTH BEFORE EACH MEAL AND SNACK. TAKE UP TO 6 CAPSULES PER DAY.  [name] Insulin Glargine (BASAGLAR KWIKPEN) 100 UNIT/ML Inject 0.11 mLs (11 Units total) into the skin at bedtime. (Patient taking differently: Inject 11 Units into the skin daily. In morning)   . insulin lispro (HUMALOG KWIKPEN) 100 UNIT/ML KwikPen Inject 0.05-0.08 mLs (5-8 Units total) into the skin 3 (three) times daily before meals. Inject 5-8 units 3 times a day with meals.  . Insulin Pen Needle (PEN NEEDLES) 31G X 6 MM MISC Use as directed to check blood sugar 4 times a day.  . pantoprazole (PROTONIX) 40 MG tablet TAKE 1 TABLET BY MOUTH TWICE DAILY   No facility-administered encounter medications on file as of 05/27/2020.   Patient Care Team    Relationship Specialty Notifications Start End  [name], MD PCP - General Family Medicine  08/21/18   Satira Sark, MD PCP - Cardiology Cardiology Admissions 08/30/17   Madelin Rear, [facility] Pharmacist Pharmacist  04/17/20    Comment: [phone]   Current Diagnosis/Assessment: Goals Addressed            This Visit's Progress   . PharmD Care Plan       CARE PLAN ENTRY (see longitudinal plan of care for additional care plan information)  Current Barriers:  . Chronic Disease Management support, education, and care coordination needs related to Hypertension, Hyperlipidemia, and Diabetes   Hypertension  Recent home reading - yesterday 126/83  BP Readings from Last 3  Encounters:  05/26/20 140/68  05/21/20 120/70  03/30/20 (!) 125/57   . Pharmacist Clinical Goal(s): o Over the next 180 days, patient will work with PharmD and providers to maintain BP goal <130/80 . Current regimen:  o N/a - no current medications . Interventions: o Diet/exercise recommendations . Patient self care activities - Over the next 180 days, patient will: o Check BP once every other day, document, and provide at future appointments o Ensure daily salt intake < 2300 mg/day  Hyperlipidemia Lab Results  Component Value Date/Time   LDLCALC 28 10/09/2019 10:19 AM   . Pharmacist Clinical Goal(s): o Over the next 180 days, patient will work with PharmD and providers to maintain LDL goal < 70 . Current regimen:  o Atorvastatin 20 mg once  daily . Interventions: o Diet/exercise recommendations . Patient self care activities - Over the next 180 days, patient will: o Continue current management  Diabetes Lab Results  Component Value Date/Time   HGBA1C 7.2 (A) 05/21/2020 10:46 AM   HGBA1C 6.5 (A) 09/17/2019 11:13 AM   HGBA1C 6.4 (H) 02/09/2018 11:32 AM   HGBA1C 8.2 (H) 11/28/2017 02:05 PM   . Pharmacist Clinical Goal(s): o Over the next 180 days, patient will work with PharmD and providers to achieve A1c goal <7% . Current regimen:  o Basaglar 11 units every morning o Humalog 3-6 units with three meals . Interventions: o Continue current management . Patient self care activities - Over the next 180 days, patient will: o Check blood sugar 3-4 times daily, document, and provide at future appointments o Contact provider with any episodes of hypoglycemia  Medication management . Pharmacist Clinical Goal(s): o Over the next 180 days, patient will work with PharmD and providers to maintain optimal medication adherence . Current pharmacy: [facility] . Interventions o Comprehensive medication review performed. o Continue current medication management strategy . Patient self care activities - Over the next 180 days, patient will: o Take medications as prescribed o Report any questions or concerns to PharmD and/or provider(s) Initial goal documentation.      Hypertension / CAD   BP goal <130/80  BP Readings from Last 3 Encounters:  05/26/20 140/68  05/21/20 120/70  03/30/20 (!) 125/57   Checking BP ever other day at home, yesterday 126/83.  Patient is currently controlled on the following medications:  . No BP medications at this time  Secondary prevention. s/p AMI 11/2016 - s/p DES x4. GI bleed 12/2019. Denies any abnormal bruising, bleeding from nose or gums or blood in urine or stool. Patient is currently controlled on the following medications:  . Clopidogrel 75 mg once daily  We discussed diet  and exercise extensively. Reports to be minimizing salt intake.   Plan  Continue current medications and control with diet and exercise.   Diabetes   A1c goal < 7%  Lab Results  Component Value Date/Time   HGBA1C 7.2 (A) 05/21/2020 10:46 AM   HGBA1C 6.5 (A) 09/17/2019 11:13 AM   HGBA1C 6.4 (H) 02/09/2018 11:32 AM   HGBA1C 8.2 (H) 11/28/2017 02:05 PM   EGFR 59 (L) 09/07/2017 08:44 AM    Checking BG: Daily. Has freestyle CBG, 85% within range 05/2020 visit with endocrinologist.   Previous medications: Patient is currently controlled on the following medications:  . Humalog 3-6 units before meals . Basaglar 11 units in am  We discussed: diet and exercise extensively and how to recognize and treat signs of hypoglycemia.  Plan  Continue current medications  and control with diet and exercise.  Hyperlipidemia   LDL goal < 70  Lipid Panel     Component Value Date/Time   CHOL 92 10/09/2019 1019   TRIG 71.0 10/09/2019 1019   HDL 49.70 10/09/2019 1019   LDLCALC 28 10/09/2019 1019    Hepatic Function Latest Ref Rng & Units 01/09/2020 01/01/2020 12/30/2019  Total Protein 6.5 - 8.1 g/dL 5.6(L) 4.7(L) 5.1(L)  Albumin 3.5 - 5.0 g/dL 2.8(L) 2.8(L) 3.0(L)  AST 15 - 41 U/L 16 23 18   ALT 0 - 44 U/L 14 19 18   Alk Phosphatase 38 - 126 U/L 100 58 70  Total Bilirubin 0.3 - 1.2 mg/dL 0.2(L) 1.4(H) 0.5  Bilirubin, Direct 0.1 - 0.5 mg/dL - - -    The ASCVD Risk score ([place]., et al., 2013) failed to calculate for the following reasons:   The patient has a prior MI or stroke diagnosis   Patient has failed these meds in past: n/a Denies any side effects.LDL at goal. Patient is currently at goal on the following medications:  . Atorvastatin 20 mg once daily   We discussed:  diet and exercise extensively.  Plan  Continue current medications and control with diet and exercise.  GERD/GI protection   Patient denies recent acid reflux. Currently controlled on: . Pantoprazole 40 mg  twice daily   Plan   Continue current medications.  Medication Management / Care Coordination   Receives prescription medications from:  [place] [place] [address] [place] [address] Phone: [phone] Fax: [phone]  Using PAP for CREON, otherwise no cost-related concerns.   Denies any issues with current medication management.   Declines vaccination including covid.  Encouraged pt to schedule eye appointment.   Plan  Continue current medication management strategy. ___________________________ SDOH (Social Determinants of Health) assessments performed: Yes.  Future Appointments  Date Time Provider [facility]  07/13/2020  9:40 AM Satira Sark, MD [facility]  08/13/2020 10:00 AM McKenzie, Candee Furbish, MD [facility] None  08/25/2020 10:00 AM [facility] None  08/25/2020 10:30 AM Ladell Pier, MD [facility] None  09/29/2020 10:00 AM Philemon Kingdom, MD [facility] None  12/01/2020  1:00 PM [facility] CCM PHARMACIST [facility]   Visit follow-up:  . CPA follow-up: October, February BP check. [name] RPH follow-up: 6 month telephone visit.  Madelin Rear, Pharm.D., BCGP Clinical Pharmacist [facility] [phone]

## 2020-05-27 NOTE — Telephone Encounter (Signed)
-----   Message from Ladell Pier, MD sent at 05/27/2020  1:55 PM EDT ----- Please call patient, CA 19-9 is normal, follow-up as scheduled

## 2020-05-27 NOTE — Telephone Encounter (Signed)
Called patient w/CA 19-9 results. Wife answered and took the message.

## 2020-05-27 NOTE — Patient Instructions (Addendum)
Please review care plan below and call me at [phone] (direct line) with any questions!  Thank you, Manuel Olson., Clinical Pharmacist  Goals Addressed            This Visit's Progress   . PharmD Care Plan       CARE PLAN ENTRY (see longitudinal plan of care for additional care plan information)  Current Barriers:  . Chronic Disease Management support, education, and care coordination needs related to Hypertension, Hyperlipidemia, and Diabetes   Hypertension  Recent home reading - yesterday 126/83  BP Readings from Last 3 Encounters:  05/26/20 140/68  05/21/20 120/70  03/30/20 (!) 125/57   . Pharmacist Clinical Goal(s): o Over the next 180 days, patient will work with PharmD and providers to maintain BP goal <130/80 . Current regimen:  o N/a - no current medications . Interventions: o Diet/exercise recommendations . Patient self care activities - Over the next 180 days, patient will: o Check BP once every other day, document, and provide at future appointments o Ensure daily salt intake < 2300 mg/day  Hyperlipidemia Lab Results  Component Value Date/Time   LDLCALC 28 10/09/2019 10:19 AM   . Pharmacist Clinical Goal(s): o Over the next 180 days, patient will work with PharmD and providers to maintain LDL goal < 70 . Current regimen:  o Atorvastatin 20 mg once daily . Interventions: o Diet/exercise recommendations . Patient self care activities - Over the next 180 days, patient will: o Continue current management  Diabetes Lab Results  Component Value Date/Time   HGBA1C 7.2 (A) 05/21/2020 10:46 AM   HGBA1C 6.5 (A) 09/17/2019 11:13 AM   HGBA1C 6.4 (H) 02/09/2018 11:32 AM   HGBA1C 8.2 (H) 11/28/2017 02:05 PM   . Pharmacist Clinical Goal(s): o Over the next 180 days, patient will work with PharmD and providers to achieve A1c goal <7% . Current regimen:  o Basaglar 11 units every morning o Humalog 3-6 units with three meals . Interventions: o Continue current  management . Patient self care activities - Over the next 180 days, patient will: o Check blood sugar 3-4 times daily, document, and provide at future appointments o Contact provider with any episodes of hypoglycemia  Medication management . Pharmacist Clinical Goal(s): o Over the next 180 days, patient will work with PharmD and providers to maintain optimal medication adherence . Current pharmacy: [facility] . Interventions o Comprehensive medication review performed. o Continue current medication management strategy . Patient self care activities - Over the next 180 days, patient will: o Take medications as prescribed o Report any questions or concerns to PharmD and/or provider(s) Initial goal documentation.      Manuel Olson was given information about Chronic Care Management services today including:  1. CCM service includes personalized support from designated clinical staff supervised by his physician, including individualized plan of care and coordination with other care providers 2. 24/7 contact phone numbers for assistance for urgent and routine care needs. 3. Standard insurance, coinsurance, copays and deductibles apply for chronic care management only during months in which we provide at least 20 minutes of these services. Most insurances cover these services at 100%, however patients may be responsible for any copay, coinsurance and/or deductible if applicable. This service may help you avoid the need for more expensive face-to-face services. 4. Only one practitioner may furnish and bill the service in a calendar month. 5. The patient may stop CCM services at any time (effective at the end of the month) by  phone call to the office staff.  Patient agreed to services and verbal consent obtained.   The patient verbalized understanding of instructions provided today and agreed to receive a mailed copy of patient instruction and/or educational materials. Telephone  follow up appointment with pharmacy team member scheduled for: See next appointment with "Care Management Staff" under "What's Next" below.   Madelin Rear, Pharm.D., BCGP Clinical Pharmacist [facility] [phone]  Hypertension, Adult High blood pressure (hypertension) is when the force of blood pumping through the arteries is too strong. The arteries are the blood vessels that carry blood from the heart throughout the body. Hypertension forces the heart to work harder to pump blood and may cause arteries to become narrow or stiff. Untreated or uncontrolled hypertension can cause a heart attack, heart failure, a stroke, kidney disease, and other problems. A blood pressure reading consists of a higher number over a lower number. Ideally, your blood pressure should be below 120/80. The first ("top") number is called the systolic pressure. It is a measure of the pressure in your arteries as your heart beats. The second ("bottom") number is called the diastolic pressure. It is a measure of the pressure in your arteries as the heart relaxes. What are the causes? The exact cause of this condition is not known. There are some conditions that result in or are related to high blood pressure. What increases the risk? Some risk factors for high blood pressure are under your control. The following factors may make you more likely to develop this condition:  Smoking.  Having type 2 diabetes mellitus, high cholesterol, or both.  Not getting enough exercise or physical activity.  Being overweight.  Having too much fat, sugar, calories, or salt (sodium) in your diet.  Drinking too much alcohol. Some risk factors for high blood pressure may be difficult or impossible to change. Some of these factors include:  Having chronic kidney disease.  Having a family history of high blood pressure.  Age. Risk increases with age.  Race. You may be at higher risk if you are African  American.  Gender. Men are at higher risk than women before age 75. After age 69, women are at higher risk than men.  Having obstructive sleep apnea.  Stress. What are the signs or symptoms? High blood pressure may not cause symptoms. Very high blood pressure (hypertensive crisis) may cause:  Headache.  Anxiety.  Shortness of breath.  Nosebleed.  Nausea and vomiting.  Vision changes.  Severe chest pain.  Seizures. How is this diagnosed? This condition is diagnosed by measuring your blood pressure while you are seated, with your arm resting on a flat surface, your legs uncrossed, and your feet flat on the floor. The cuff of the blood pressure monitor will be placed directly against the skin of your upper arm at the level of your heart. It should be measured at least twice using the same arm. Certain conditions can cause a difference in blood pressure between your right and left arms. Certain factors can cause blood pressure readings to be lower or higher than normal for a short period of time:  When your blood pressure is higher when you are in a health care provider's office than when you are at home, this is called white coat hypertension. Most people with this condition do not need medicines.  When your blood pressure is higher at home than when you are in a health care provider's office, this is called masked hypertension. Most  people with this condition may need medicines to control blood pressure. If you have a high blood pressure reading during one visit or you have normal blood pressure with other risk factors, you may be asked to:  Return on a different day to have your blood pressure checked again.  Monitor your blood pressure at home for 1 week or longer. If you are diagnosed with hypertension, you may have other blood or imaging tests to help your health care provider understand your overall risk for other conditions. How is this treated? This condition is treated by  making healthy lifestyle changes, such as eating healthy foods, exercising more, and reducing your alcohol intake. Your health care provider may prescribe medicine if lifestyle changes are not enough to get your blood pressure under control, and if:  Your systolic blood pressure is above 130.  Your diastolic blood pressure is above 80. Your personal target blood pressure may vary depending on your medical conditions, your age, and other factors. Follow these instructions at home: Eating and drinking   Eat a diet that is high in fiber and potassium, and low in sodium, added sugar, and fat. An example eating plan is called the DASH (Dietary Approaches to Stop Hypertension) diet. To eat this way: ? Eat plenty of fresh fruits and vegetables. Try to fill one half of your plate at each meal with fruits and vegetables. ? Eat whole grains, such as whole-wheat pasta, brown rice, or whole-grain bread. Fill about one fourth of your plate with whole grains. ? Eat or drink low-fat dairy products, such as skim milk or low-fat yogurt. ? Avoid fatty cuts of meat, processed or cured meats, and poultry with skin. Fill about one fourth of your plate with lean proteins, such as fish, chicken without skin, beans, eggs, or tofu. ? Avoid pre-made and processed foods. These tend to be higher in sodium, added sugar, and fat.  Reduce your daily sodium intake. Most people with hypertension should eat less than 1,500 mg of sodium a day.  Do not drink alcohol if: ? Your health care provider tells you not to drink. ? You are pregnant, may be pregnant, or are planning to become pregnant.  If you drink alcohol: ? Limit how much you use to:  0-1 drink a day for women.  0-2 drinks a day for men. ? Be aware of how much alcohol is in your drink. In the [place] one drink equals one 12 oz bottle of beer (355 mL), one 5 oz glass of wine (148 mL), or one 1 oz glass of hard liquor (44 mL). Lifestyle   Work with your health  care provider to maintain a healthy body weight or to lose weight. Ask what an ideal weight is for you.  Get at least 30 minutes of exercise most days of the week. Activities may include walking, swimming, or biking.  Include exercise to strengthen your muscles (resistance exercise), such as Pilates or lifting weights, as part of your weekly exercise routine. Try to do these types of exercises for 30 minutes at least 3 days a week.  Do not use any products that contain nicotine or tobacco, such as cigarettes, e-cigarettes, and chewing tobacco. If you need help quitting, ask your health care provider.  Monitor your blood pressure at home as told by your health care provider.  Keep all follow-up visits as told by your health care provider. This is important. Medicines  Take over-the-counter and prescription medicines only as told by your  health care provider. Follow directions carefully. Blood pressure medicines must be taken as prescribed.  Do not skip doses of blood pressure medicine. Doing this puts you at risk for problems and can make the medicine less effective.  Ask your health care provider about side effects or reactions to medicines that you should watch for. Contact a health care provider if you:  Think you are having a reaction to a medicine you are taking.  Have headaches that keep coming back (recurring).  Feel dizzy.  Have swelling in your ankles.  Have trouble with your vision. Get help right away if you:  Develop a severe headache or confusion.  Have unusual weakness or numbness.  Feel faint.  Have severe pain in your chest or abdomen.  Vomit repeatedly.  Have trouble breathing. Summary  Hypertension is when the force of blood pumping through your arteries is too strong. If this condition is not controlled, it may put you at risk for serious complications.  Your personal target blood pressure may vary depending on your medical conditions, your age, and  other factors. For most people, a normal blood pressure is less than 120/80.  Hypertension is treated with lifestyle changes, medicines, or a combination of both. Lifestyle changes include losing weight, eating a healthy, low-sodium diet, exercising more, and limiting alcohol. This information is not intended to replace advice given to you by your health care provider. Make sure you discuss any questions you have with your health care provider. Document Revised: 05/09/2018 Document Reviewed: 05/09/2018 Elsevier Patient Education  2020 [employer].

## 2020-05-29 ENCOUNTER — Other Ambulatory Visit: Payer: Self-pay | Admitting: *Deleted

## 2020-05-29 DIAGNOSIS — F0391 Unspecified dementia with behavioral disturbance: Secondary | ICD-10-CM

## 2020-06-18 ENCOUNTER — Other Ambulatory Visit: Payer: Self-pay | Admitting: Internal Medicine

## 2020-06-18 MED ORDER — BASAGLAR KWIKPEN 100 UNIT/ML ~~LOC~~ SOPN: PEN_INJECTOR | SUBCUTANEOUS | 3 refills | Status: DC

## 2020-06-18 NOTE — Telephone Encounter (Signed)
Medication Refill Request  Did you call your pharmacy and request this refill first? YES  . If patient has not contacted pharmacy first, instruct them to do so for future refills.  . Remind them that contacting the pharmacy for their refill is the quickest method to get the refill.  . Refill policy also stated that it will take anywhere between 24-72 hours to receive the refill.    Name of medication? Basaglar  Is this a 24 day supply? Unknown  Name and location of pharmacy? [facility] in [place], [place]

## 2020-06-18 NOTE — Telephone Encounter (Signed)
Basaglar refill sent.

## 2020-06-22 ENCOUNTER — Telehealth: Payer: Self-pay

## 2020-06-22 NOTE — Progress Notes (Signed)
Chronic Care Management Pharmacy Assistant   Name: CASE VASSELL  MRN: [id number] DOB: 04/11/1946  Reason for Encounter: Disease State   PCP : [name], MD  Allergies:  No Known Allergies  Medications: Outpatient Encounter Medications as of 06/22/2020  Medication Sig  . atorvastatin (LIPITOR) 20 MG tablet TAKE 1 TABLET BY MOUTH ONCE DAILY.  [name] clopidogrel (PLAVIX) 75 MG tablet TAKE 1 TABLET BY MOUTH ONCE DAILY WITH BREAKFAST.  [name] CREON 36000 units CPEP capsule TAKE 1 CAPSULE BY MOUTH BEFORE EACH MEAL AND SNACK. TAKE UP TO 6 CAPSULES PER DAY.  [name] Insulin Glargine (BASAGLAR KWIKPEN) 100 UNIT/ML 11 units in the morning  . insulin lispro (HUMALOG KWIKPEN) 100 UNIT/ML KwikPen Inject 0.05-0.08 mLs (5-8 Units total) into the skin 3 (three) times daily before meals. Inject 5-8 units 3 times a day with meals.  . Insulin Pen Needle (PEN NEEDLES) 31G X 6 MM MISC Use as directed to check blood sugar 4 times a day.  . pantoprazole (PROTONIX) 40 MG tablet TAKE 1 TABLET BY MOUTH TWICE DAILY   No facility-administered encounter medications on file as of 06/22/2020.    Current Diagnosis: Patient Active Problem List   Diagnosis Date Noted  . Anastomotic ulcer 01/21/2020  . Long term (current) use of antithrombotics/antiplatelets 01/21/2020  . Upper gastrointestinal bleed 12/31/2019  . Acute GI bleeding 12/30/2019  . Angina pectoris ([place]) 08/22/2019  . Protein-calorie malnutrition, severe 04/05/2018  . Debility 02/27/2018  . Port-A-Cath in place 10/10/2017  . Renal cell carcinoma of right kidney [facility]) s/p nephrectomy 02/2018 10/02/2017  . Ileitis, terminal ([place]) 09/28/2017  . Insulin dependent type 2 diabetes mellitus ([place]) 08/25/2017  . Mixed hyperlipidemia 08/25/2017  . Essential hypertension, benign   . CAD (coronary artery disease)   . Former smoker   . Severe AS S/P TAVR  08/22/2017  . Cancer of head of pancreas [facility]) s/p [place] 02/2018 08/10/2017    Reviewed chart  prior to disease state call. Spoke with patient regarding BP  Recent Office Vitals: BP Readings from Last 3 Encounters:  05/26/20 140/68  05/21/20 120/70  03/30/20 (!) 125/57   Pulse Readings from Last 3 Encounters:  05/26/20 75  05/21/20 88  03/30/20 84    Wt Readings from Last 3 Encounters:  05/26/20 133 lb (60.3 kg)  05/21/20 132 lb (59.9 kg)  03/30/20 132 lb 12.8 oz (60.2 kg)     Kidney Function Lab Results  Component Value Date/Time   CREATININE 1.67 (H) 01/09/2020 01:48 PM   CREATININE 1.85 (H) 01/01/2020 06:58 PM   CREATININE 1.59 (H) 12/31/2019 08:14 AM   CREATININE 1.67 (H) 08/27/2019 09:43 AM   CREATININE 1.2 09/07/2017 08:44 AM   GFR 45.15 (L) 10/09/2019 10:19 AM   GFRNONAA 39 (L) 01/09/2020 01:48 PM   GFRAA 45 (L) 01/09/2020 01:48 PM    BMP Latest Ref Rng & Units 01/09/2020 01/01/2020 12/31/2019  Glucose 70 - 99 mg/dL 123(H) 146(H) 175(H)  BUN 8 - 23 mg/dL 28(H) 54(H) 55(H)  Creatinine 0.61 - 1.24 mg/dL 1.67(H) 1.85(H) 1.59(H)  Sodium 135 - 145 mmol/L 137 136 136  Potassium 3.5 - 5.1 mmol/L 4.4 4.4 4.3  Chloride 98 - 111 mmol/L 104 107 110  CO2 22 - 32 mmol/L 27 21(L) 19(L)  Calcium 8.9 - 10.3 mg/dL 8.5(L) 7.9(L) 7.2(L)    . Current antihypertensive regimen:  atorvastatin (LIPITOR) 20 MG tablet . How often are you checking your Blood Pressure? 3-5x per week . Current home  BP readings:  Patients wife stated his numbers have been 120/70.  [name] What recent interventions/DTPs have been made by any provider to improve Blood Pressure control since last CPP Visit: None Noted . Any recent hospitalizations or ED visits since last visit with CPP? No . What diet changes have been made to improve Blood Pressure Control?  o Patients wife states he follows a  Normal diet . What exercise is being done to improve your Blood Pressure Control?  o Patients wife stated he is getting plenty of exercise. Patient states he walks.  Adherence Review: Is the patient currently on  ACE/ARB medication? No Does the patient have >5 day gap between last estimated fill dates? No  Patients wife states if  he has any Blood pressure problems she will call his cardiologist.  Georgiana Shore ,[place] Pharmacist Assistant [place]        Follow-Up:  Pharmacist Review

## 2020-07-06 ENCOUNTER — Other Ambulatory Visit: Payer: Self-pay

## 2020-07-06 DIAGNOSIS — C649 Malignant neoplasm of unspecified kidney, except renal pelvis: Secondary | ICD-10-CM

## 2020-07-12 ENCOUNTER — Encounter: Payer: Self-pay | Admitting: Cardiology

## 2020-07-12 NOTE — Progress Notes (Signed)
Cardiology Office Note  Date: 07/13/2020   ID: DEAVEN URWIN, DOB [birth year], MRN [id number]  PCP:  [name], MD  Cardiologist:  Rozann Lesches, MD Electrophysiologist:  None   Chief Complaint  Patient presents with  . Cardiac follow-up    History of Present Illness: Manuel Olson is a 74 y.o. male last seen in May.  He is here today with his wife for follow-up visit.  He tells me that he has been feeling well, appetite is good, he has gained back additional weight.  Also doing other chores around the house without any active angina symptoms.  He reports NYHA class II dyspnea.  I reviewed his current medications.  Cardiac regimen includes Plavix and Lipitor at this time.  No longer on ARB or beta-blocker due to symptomatic hypotension.  I reviewed his home blood pressure checks, average systolic is in the 277O.  His last echocardiogram was in November 2020.  We discussed obtaining a follow-up study for surveillance of his TAVR prosthesis.  Past Medical History:  Diagnosis Date  . Arthritis    back  . Blood transfusion without reported diagnosis   . CAD (coronary artery disease)    a. 11/2016 in [place]: STEMI s/p DES to mid LAD, DES to diagonal, DES x 2 to proximal and mid RCA b. 08/2019: NSTEMI with DES to distal RCA  . Essential hypertension   . Full dentures   . GERD (gastroesophageal reflux disease)   . History of stroke   . Hyperlipidemia   . Myocardial infarction ([place])   . Pancreatic cancer ([place])   . Pneumonia   . Renal cell carcinoma ([place])   . S/P TAVR (transcatheter aortic valve replacement)    a. 08/2017:  Edwards Sapien 3 THV (size 26 mm, model #9600CM26A , serial # [id number])  . Severe aortic stenosis    a. 08/2017: s/p TAVR by Dr. Angelena Form and Dr. Cyndia Bent.   . Type 2 diabetes mellitus ([place])   . Wears glasses   . Wears hearing aid     Past Surgical History:  Procedure Laterality Date  . APPENDECTOMY    . CARDIAC CATHETERIZATION    . CORONARY  ANGIOGRAPHY N/A 08/23/2019   Procedure: CORONARY ANGIOGRAPHY;  Surgeon: Belva Crome, MD;  Location: [facility];  Service: Cardiovascular;  Laterality: N/A;  . CORONARY STENT INTERVENTION N/A 08/23/2019   Procedure: CORONARY STENT INTERVENTION;  Surgeon: Belva Crome, MD;  Location: [facility];  Service: Cardiovascular;  Laterality: N/A;  . CORONARY STENT PLACEMENT     x 4 in March 2018  . ESOPHAGOGASTRODUODENOSCOPY (EGD) WITH PROPOFOL N/A 01/02/2020   Procedure: ESOPHAGOGASTRODUODENOSCOPY (EGD) WITH PROPOFOL;  Surgeon: Jackquline Denmark, MD;  Location: [facility];  Service: Endoscopy;  Laterality: N/A;  . EUS N/A 08/02/2017   Procedure: UPPER ENDOSCOPIC ULTRASOUND (EUS) RADIAL;  Surgeon: [name], MD;  Location: [facility];  Service: Endoscopy;  Laterality: N/A;  . EYE SURGERY Left   . HAND SURGERY Bilateral   . HEMOSTASIS CLIP PLACEMENT  01/02/2020   Procedure: HEMOSTASIS CLIP PLACEMENT;  Surgeon: Jackquline Denmark, MD;  Location: [facility];  Service: Endoscopy;;  . HERNIA REPAIR Right   . HOT HEMOSTASIS N/A 01/02/2020   Procedure: HOT HEMOSTASIS (ARGON PLASMA COAGULATION/BICAP);  Surgeon: Jackquline Denmark, MD;  Location: [facility];  Service: Endoscopy;  Laterality: N/A;  . LAPAROSCOPY N/A 02/19/2018   Procedure: DIAGNOSTIC LAPAROSCOPY, ERAS PATHWAY;  Surgeon: [name], MD;  Location: [facility];  Service: General;  Laterality: N/A;  . MULTIPLE TOOTH EXTRACTIONS    . NEPHRECTOMY Left 02/19/2018   Procedure: OPEN LEFT RADICAL NEPHRECTOMY;  Surgeon: Cleon Gustin, MD;  Location: [facility];  Service: Urology;  Laterality: Left;  . PORT-A-CATH REMOVAL N/A 06/13/2019   Procedure: PORT REMOVAL;  Surgeon: [name], MD;  Location: [facility];  Service: General;  Laterality: N/A;  . PORTACATH PLACEMENT N/A 09/15/2017   Procedure: INSERTION PORT-A-CATH;  Surgeon: [name], MD;  Location: [facility];  Service: General;  Laterality: N/A;  . RIGHT/LEFT HEART  CATH AND CORONARY ANGIOGRAPHY N/A 07/05/2017   Procedure: RIGHT/LEFT HEART CATH AND CORONARY ANGIOGRAPHY;  Surgeon: [name], MD;  Location: [facility];  Service: Cardiovascular;  Laterality: N/A;  . SCLEROTHERAPY  01/02/2020   Procedure: SCLEROTHERAPY;  Surgeon: Jackquline Denmark, MD;  Location: [facility];  Service: Endoscopy;;  . SHOULDER ARTHROSCOPY WITH ROTATOR CUFF REPAIR Left   . TEE WITHOUT CARDIOVERSION N/A 08/22/2017   Procedure: TRANSESOPHAGEAL ECHOCARDIOGRAM (TEE);  Surgeon: Burnell Blanks, MD;  Location: [facility];  Service: Open Heart Surgery;  Laterality: N/A;  . TRANSCATHETER AORTIC VALVE REPLACEMENT, TRANSFEMORAL N/A 08/22/2017   Procedure: TRANSCATHETER AORTIC VALVE REPLACEMENT, TRANSFEMORAL;  Surgeon: Burnell Blanks, MD;  Location: [facility];  Service: Open Heart Surgery;  Laterality: N/A;  . UPPER GASTROINTESTINAL ENDOSCOPY    . WHIPPLE PROCEDURE N/A 02/19/2018   Procedure: WHIPPLE PROCEDURE;  Surgeon: [name], MD;  Location: [facility];  Service: General;  Laterality: N/A;    Current Outpatient Medications  Medication Sig Dispense Refill  . atorvastatin (LIPITOR) 20 MG tablet TAKE 1 TABLET BY MOUTH ONCE DAILY. 90 tablet 3  . clopidogrel (PLAVIX) 75 MG tablet TAKE 1 TABLET BY MOUTH ONCE DAILY WITH BREAKFAST. 90 tablet 3  . CREON 36000 units CPEP capsule TAKE 1 CAPSULE BY MOUTH BEFORE EACH MEAL AND SNACK. TAKE UP TO 6 CAPSULES PER DAY. 90 capsule 0  . ferrous sulfate 325 (65 FE) MG tablet Take 325 mg by mouth daily with breakfast.    . Insulin Glargine (BASAGLAR KWIKPEN) 100 UNIT/ML 11 units in the morning 9 mL 3  . insulin lispro (HUMALOG KWIKPEN) 100 UNIT/ML KwikPen Inject 0.05-0.08 mLs (5-8 Units total) into the skin 3 (three) times daily before meals. Inject 5-8 units 3 times a day with meals. 21.6 mL 2  . Insulin Pen Needle (PEN NEEDLES) 31G X 6 MM MISC Use as directed to check blood sugar 4 times a day. 400 each 2  . pantoprazole (PROTONIX) 40 MG  tablet TAKE 1 TABLET BY MOUTH TWICE DAILY 60 tablet 5   No current facility-administered medications for this visit.   Allergies:  Patient has no known allergies.   ROS: No palpitations or syncope.  Physical Exam: VS:  BP 128/80   Pulse 83   Ht 5' 9"  (1.753 m)   Wt 137 lb (62.1 kg)   SpO2 95%   BMI 20.23 kg/m , BMI Body mass index is 20.23 kg/m.  Wt Readings from Last 3 Encounters:  07/13/20 137 lb (62.1 kg)  05/26/20 133 lb (60.3 kg)  05/21/20 132 lb (59.9 kg)    General: Elderly male, appears comfortable at rest. HEENT: Conjunctiva and lids normal, wearing a mask. Neck: Supple, no elevated JVP or carotid bruits, no thyromegaly. Lungs: Clear to auscultation, nonlabored breathing at rest. Cardiac: Regular rate and rhythm, no S3, soft systolic murmur, no pericardial rub. Extremities: No pitting edema, distal pulses 2+.  ECG:  An ECG dated 12/30/2019 was personally reviewed today and demonstrated:  Sinus rhythm with poor R wave progression.  Recent Labwork: 10/09/2019: TSH 1.77 01/01/2020: Magnesium 2.0 01/09/2020: ALT 14; AST 16; BUN 28; Creatinine 1.67; Potassium 4.4; Sodium 137 05/26/2020: Hemoglobin 11.9; Platelet Count 136     Component Value Date/Time   CHOL 92 10/09/2019 1019   TRIG 71.0 10/09/2019 1019   HDL 49.70 10/09/2019 1019   CHOLHDL 2 10/09/2019 1019   VLDL 14.2 10/09/2019 1019   LDLCALC 28 10/09/2019 1019    Other Studies Reviewed Today:  Cardiac catheterization 08/23/2019:  A stent was successfully placed.   Non-ST elevation myocardial infarction due to de novo plaque rupture in the distal RCA.  Complicated but ultimately successful distal RCA stent using a 22 x 2.75 Onyx postdilated to 3.0 decreasing 99% stenosis to 0% with TIMI grade III flow.  Left main is widely patent  LAD contains a patent mid vessel stent. Proximal to the stent is eccentric 50% narrowing. First diagonal contains a stent that is widely patent.  A moderate size ramus  intermedius contains 50 to 75% stenosis in its mid segment.  Circumflex is relatively small and is widely patent.  The SAPIEN TAVR valve was not crossed.  Echocardiogram 08/06/2019: 1. Left ventricular ejection fraction, by visual estimation, is 50%. The  left ventricle has low normal function. There is borderline left  ventricular hypertrophy.  2. Elevated left atrial pressure.  3. Left ventricular diastolic parameters are consistent with Grade I  diastolic dysfunction (impaired relaxation).  4. The left ventricle demonstrates global hypokinesis.  5. Global right ventricle has normal systolic function.The right  ventricular size is normal. No increase in right ventricular wall  thickness.  6. Left atrial size was normal.  7. Right atrial size was normal.  8. Presence of pericardial fat pad.  9. Mild to moderate mitral annular calcification.  10. The mitral valve is grossly normal. Trace mitral valve regurgitation.  11. The tricuspid valve is grossly normal. Tricuspid valve regurgitation  is trivial.  12. A 19m Edwards Sapien 3 THV is present in the aortic position. No  perivalvular leak.  13. Aortic valve mean gradient measures 9.0 mmHg.  14. Aortic valve regurgitation is not visualized.  15. The pulmonic valve was grossly normal. Pulmonic valve regurgitation is  trivial.  16. The inferior vena cava is dilated in size with >50% respiratory  variability, suggesting right atrial pressure of 8 mmHg.  17. TR signal is inadequate for assessing pulmonary artery systolic  pressure.   Assessment and Plan:  1.  Severe aortic stenosis status post TAVR in 2018.  He is doing well, no change on cardiac examination.  Follow-up echocardiogram will be obtained for surveillance.  No paravalvular leak at last assessment.  2.  Multivessel CAD status post DES to the LAD, DES to the first diagonal, and DES x2 to the RCA in 2018.  He is subsequently status post DES to the distal RCA in  December 2020.  Doing well without active angina at this time.  Continue Plavix and statin.  No longer on ARB or beta-blocker with previous symptomatic hypotension and bradycardia.  Medication Adjustments/Labs and Tests Ordered: Current medicines are reviewed at length with the patient today.  Concerns regarding medicines are outlined above.   Tests Ordered: Orders Placed This Encounter  Procedures  . ECHOCARDIOGRAM COMPLETE    Medication Changes: No orders of the defined types were placed in this encounter.   Disposition:  Follow up 6  months.  Signed, Satira Sark, MD, [facility] 07/13/2020 10:08 AM    [facility] [place], [place], [place] [address] Phone: [phone]; Fax: [phone]

## 2020-07-13 ENCOUNTER — Encounter: Payer: Self-pay | Admitting: Cardiology

## 2020-07-13 ENCOUNTER — Ambulatory Visit (INDEPENDENT_AMBULATORY_CARE_PROVIDER_SITE_OTHER): Payer: Medicare Other | Admitting: Cardiology

## 2020-07-13 ENCOUNTER — Telehealth: Payer: Self-pay | Admitting: Cardiology

## 2020-07-13 VITALS — BP 128/80 | HR 83 | Ht 69.0 in | Wt 137.0 lb

## 2020-07-13 DIAGNOSIS — I25119 Atherosclerotic heart disease of native coronary artery with unspecified angina pectoris: Secondary | ICD-10-CM

## 2020-07-13 DIAGNOSIS — Z952 Presence of prosthetic heart valve: Secondary | ICD-10-CM

## 2020-07-13 NOTE — Telephone Encounter (Signed)
Pre-cert Verification for the following procedure    ECHO   DATE:   07/16/2020  LOCATION: [facility]

## 2020-07-13 NOTE — Patient Instructions (Signed)
Your physician wants you to follow-up in: [place] will receive a reminder letter in the mail two months in advance. If you don't receive a letter, please call our office to schedule the follow-up appointment.  Your physician recommends that you continue on your current medications as directed. Please refer to the Current Medication list given to you today.  Your physician has requested that you have an echocardiogram. Echocardiography is a painless test that uses sound waves to create images of your heart. It provides your doctor with information about the size and shape of your heart and how well your heart's chambers and valves are working. This procedure takes approximately one hour. There are no restrictions for this procedure.   Thank you for choosing [facility]!!

## 2020-07-16 ENCOUNTER — Ambulatory Visit (HOSPITAL_COMMUNITY)
Admission: RE | Admit: 2020-07-16 | Discharge: 2020-07-16 | Disposition: A | Discharge status: Home / Self Care | Payer: Medicare Other | Source: Ambulatory Visit | Attending: Cardiology | Admitting: Cardiology

## 2020-07-16 ENCOUNTER — Other Ambulatory Visit: Payer: Self-pay

## 2020-07-16 DIAGNOSIS — Z952 Presence of prosthetic heart valve: Secondary | ICD-10-CM | POA: Insufficient documentation

## 2020-07-16 LAB — ECHOCARDIOGRAM COMPLETE
AR max vel: 1.67 cm2
AV Area VTI: 1.65 cm2
AV Area mean vel: 1.66 cm2
AV Mean grad: 7 mmHg
AV Peak grad: 13.8 mmHg
Ao pk vel: 1.86 m/s
Area-P 1/2: 1.59 cm2
Calc EF: 41.8 %
S' Lateral: 4.26 cm
Single Plane A2C EF: 43.2 %
Single Plane A4C EF: 46.9 %

## 2020-07-16 NOTE — Progress Notes (Signed)
*  PRELIMINARY RESULTS* Echocardiogram 2D Echocardiogram has been performed.  Manuel Olson 07/16/2020, 11:31 AM

## 2020-07-17 ENCOUNTER — Telehealth: Payer: Self-pay | Admitting: *Deleted

## 2020-07-17 NOTE — Telephone Encounter (Signed)
-----   Message from Satira Sark, MD sent at 07/16/2020  1:00 PM EDT ----- Results reviewed.  LVEF relatively stable at 45 to 50% range.  TAVR prosthesis shows normal function with mean gradient 7 mmHg.  Continue with current follow-up plan.

## 2020-07-17 NOTE — Telephone Encounter (Signed)
Patient's wife informed. Copy sent to PCP

## 2020-08-10 ENCOUNTER — Ambulatory Visit (HOSPITAL_COMMUNITY)
Admission: RE | Admit: 2020-08-10 | Discharge: 2020-08-10 | Disposition: A | Discharge status: Home / Self Care | Payer: Medicare Other | Source: Ambulatory Visit | Attending: Urology | Admitting: Urology

## 2020-08-10 ENCOUNTER — Other Ambulatory Visit: Payer: Self-pay

## 2020-08-10 DIAGNOSIS — J449 Chronic obstructive pulmonary disease, unspecified: Secondary | ICD-10-CM | POA: Diagnosis not present

## 2020-08-10 DIAGNOSIS — J439 Emphysema, unspecified: Secondary | ICD-10-CM | POA: Diagnosis not present

## 2020-08-10 DIAGNOSIS — C649 Malignant neoplasm of unspecified kidney, except renal pelvis: Secondary | ICD-10-CM | POA: Diagnosis not present

## 2020-08-13 ENCOUNTER — Encounter: Payer: Self-pay | Admitting: Urology

## 2020-08-13 ENCOUNTER — Ambulatory Visit (INDEPENDENT_AMBULATORY_CARE_PROVIDER_SITE_OTHER): Payer: Medicare Other | Admitting: Urology

## 2020-08-13 ENCOUNTER — Other Ambulatory Visit: Payer: Self-pay

## 2020-08-13 VITALS — BP 169/73 | HR 87 | Temp 98.0°F | Ht 69.0 in | Wt 137.0 lb

## 2020-08-13 DIAGNOSIS — I25119 Atherosclerotic heart disease of native coronary artery with unspecified angina pectoris: Secondary | ICD-10-CM

## 2020-08-13 DIAGNOSIS — C649 Malignant neoplasm of unspecified kidney, except renal pelvis: Secondary | ICD-10-CM | POA: Diagnosis not present

## 2020-08-13 LAB — URINALYSIS, ROUTINE W REFLEX MICROSCOPIC
Bilirubin, UA: NEGATIVE
Glucose, UA: NEGATIVE
Ketones, UA: NEGATIVE
Leukocytes,UA: NEGATIVE
Nitrite, UA: NEGATIVE
Protein,UA: NEGATIVE
RBC, UA: NEGATIVE
Specific Gravity, UA: 1.02 (ref 1.005–1.030)
Urobilinogen, Ur: 0.2 mg/dL (ref 0.2–1.0)
pH, UA: 7 (ref 5.0–7.5)

## 2020-08-13 NOTE — Progress Notes (Signed)
Urological Symptom Review  Patient is experiencing the following symptoms: Get up at night to urinate   Review of Systems  Gastrointestinal (upper)  : Negative for upper GI symptoms  Gastrointestinal (lower) : Negative for lower GI symptoms  Constitutional : Negative for symptoms  Skin: Negative for skin symptoms  Eyes: Negative for eye symptoms  Ear/Nose/Throat : Negative for Ear/Nose/Throat symptoms  Hematologic/Lymphatic: Negative for Hematologic/Lymphatic symptoms  Cardiovascular : Negative for cardiovascular symptoms  Respiratory : Negative for respiratory symptoms  Endocrine: Negative for endocrine symptoms  Musculoskeletal: Negative for musculoskeletal symptoms  Neurological: Negative for neurological symptoms  Psychologic: Negative for psychiatric symptoms

## 2020-08-13 NOTE — Progress Notes (Signed)
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 He is doing well. NO fatigue. CXR shows COPD. No new LUTS. No recent CMP  His records from [facility] are as follows: I have kidney cancer.  HPI: Manuel Olson is a 74 year-old male established patient who is here for kidney cancer.  The problem is on the left side. He kidney cancer was diagnosed 02/19/2018. The diagnosis was made by Nicolette Bang. His cancer was diagnosed at [facility].   He is not having flank pain. He does have a good appetite. BOWEL HABITS: his bowels are moving normally. He is not having pain in new locations. He has not recently had unwanted weight loss.   03/06/2018: s/p L radical nephrectomy for T1b RCC, negative margins, negative adrenal gland   06/14/2018: Creatinine 1.4. patient is undergoing chemotherapy for metastatic pancreatic cancer.   01/18/2019: Creatinine 1.3. He has finished chemotherapy for pancreatic cancer. No recent abdominal imaging. CXR was normal   08/15/2019: CT abd shows no evidence of metastatic disease. No flank pain.   02/13/2020: CXR shows COPD changes and creatinine 1.2. He sees Dr. Ammie Dalton for pancreatic carcinoma. NO evidence of recurranc     PMH: Past Medical History:  Diagnosis Date  . Arthritis    back  . Blood transfusion without reported diagnosis   . CAD (coronary artery disease)    a. 11/2016 in [place]: STEMI s/p DES to mid LAD, DES to diagonal, DES x 2 to proximal and mid RCA b. 08/2019: NSTEMI with DES to distal RCA  . Essential hypertension   . Full dentures   . GERD (gastroesophageal reflux disease)   . History of stroke   . Hyperlipidemia   . Myocardial infarction ([place])   . Pancreatic cancer ([place])   . Pneumonia   . Renal cell carcinoma ([place])   . S/P TAVR  (transcatheter aortic valve replacement)    a. 08/2017:  Edwards Sapien 3 THV (size 26 mm, model #9600CM26A , serial # [id number])  . Severe aortic stenosis    a. 08/2017: s/p TAVR by Dr. Angelena Form and Dr. Cyndia Bent.   . Type 2 diabetes mellitus ([place])   . Wears glasses   . Wears hearing aid     Surgical History: Past Surgical History:  Procedure Laterality Date  . APPENDECTOMY    . CARDIAC CATHETERIZATION    . CORONARY ANGIOGRAPHY N/A 08/23/2019   Procedure: CORONARY ANGIOGRAPHY;  Surgeon: Belva Crome, MD;  Location: [facility];  Service: Cardiovascular;  Laterality: N/A;  . CORONARY STENT INTERVENTION N/A 08/23/2019   Procedure: CORONARY STENT INTERVENTION;  Surgeon: Belva Crome, MD;  Location: [facility];  Service: Cardiovascular;  Laterality: N/A;  . CORONARY STENT PLACEMENT     x 4 in March 2018  . ESOPHAGOGASTRODUODENOSCOPY (EGD) WITH PROPOFOL N/A 01/02/2020   Procedure: ESOPHAGOGASTRODUODENOSCOPY (EGD) WITH PROPOFOL;  Surgeon: Jackquline Denmark, MD;  [facility]: [facility];  Service: Endoscopy;  Laterality: N/A;  . EUS N/A 08/02/2017   Procedure: UPPER ENDOSCOPIC ULTRASOUND (EUS) RADIAL;  Surgeon: [name], MD;  [facility]: [facility];  Service: Endoscopy;  Laterality: N/A;  . EYE SURGERY Left   . HAND SURGERY Bilateral   . HEMOSTASIS CLIP PLACEMENT  01/02/2020   Procedure: HEMOSTASIS CLIP PLACEMENT;  Surgeon:  Jackquline Denmark, MD;  Location: [facility];  Service: Endoscopy;;  . HERNIA REPAIR Right   . HOT HEMOSTASIS N/A 01/02/2020   Procedure: HOT HEMOSTASIS (ARGON PLASMA COAGULATION/BICAP);  Surgeon: Jackquline Denmark, MD;  Location: [facility];  Service: Endoscopy;  Laterality: N/A;  . LAPAROSCOPY N/A 02/19/2018   Procedure: DIAGNOSTIC LAPAROSCOPY, ERAS PATHWAY;  Surgeon: [name], MD;  Location: [facility];  Service: General;  Laterality: N/A;  . MULTIPLE TOOTH EXTRACTIONS    . NEPHRECTOMY Left 02/19/2018   Procedure: OPEN LEFT RADICAL NEPHRECTOMY;  Surgeon:  Cleon Gustin, MD;  Location: [facility];  Service: Urology;  Laterality: Left;  . PORT-A-CATH REMOVAL N/A 06/13/2019   Procedure: PORT REMOVAL;  Surgeon: [name], MD;  Location: [facility];  Service: General;  Laterality: N/A;  . PORTACATH PLACEMENT N/A 09/15/2017   Procedure: INSERTION PORT-A-CATH;  Surgeon: [name], MD;  Location: [facility];  Service: General;  Laterality: N/A;  . RIGHT/LEFT HEART CATH AND CORONARY ANGIOGRAPHY N/A 07/05/2017   Procedure: RIGHT/LEFT HEART CATH AND CORONARY ANGIOGRAPHY;  Surgeon: [name], MD;  Location: [facility];  Service: Cardiovascular;  Laterality: N/A;  . SCLEROTHERAPY  01/02/2020   Procedure: SCLEROTHERAPY;  Surgeon: Jackquline Denmark, MD;  [facility];  Service: Endoscopy;;  . SHOULDER ARTHROSCOPY WITH ROTATOR CUFF REPAIR Left   . TEE WITHOUT CARDIOVERSION N/A 08/22/2017   Procedure: TRANSESOPHAGEAL ECHOCARDIOGRAM (TEE);  Surgeon: Burnell Blanks, MD;  Location: [facility];  Service: Open Heart Surgery;  Laterality: N/A;  . TRANSCATHETER AORTIC VALVE REPLACEMENT, TRANSFEMORAL N/A 08/22/2017   Procedure: TRANSCATHETER AORTIC VALVE REPLACEMENT, TRANSFEMORAL;  Surgeon: Burnell Blanks, MD;  Location: [facility];  Service: Open Heart Surgery;  Laterality: N/A;  . UPPER GASTROINTESTINAL ENDOSCOPY    . WHIPPLE PROCEDURE N/A 02/19/2018   Procedure: WHIPPLE PROCEDURE;  Surgeon: [name], MD;  Location: [facility];  Service: General;  Laterality: N/A;    Home Medications:  Allergies as of 08/13/2020   No Known Allergies     Medication List       Accurate as of August 13, 2020 10:03 AM. If you have any questions, ask your nurse or doctor.        atorvastatin 20 MG tablet Commonly known as: LIPITOR TAKE 1 TABLET BY MOUTH ONCE DAILY.   Basaglar KwikPen 100 UNIT/ML 11 units in the morning   clopidogrel 75 MG tablet Commonly known as: PLAVIX TAKE 1 TABLET BY MOUTH ONCE DAILY WITH BREAKFAST.   Creon  36000 UNITS Cpep capsule Generic drug: lipase/protease/amylase TAKE 1 CAPSULE BY MOUTH BEFORE EACH MEAL AND SNACK. TAKE UP TO 6 CAPSULES PER DAY.   ferrous sulfate 325 (65 FE) MG tablet Take 325 mg by mouth daily with breakfast.   insulin lispro 100 UNIT/ML KwikPen Commonly known as: HumaLOG KwikPen Inject 0.05-0.08 mLs (5-8 Units total) into the skin 3 (three) times daily before meals. Inject 5-8 units 3 times a day with meals.   pantoprazole 40 MG tablet Commonly known as: PROTONIX TAKE 1 TABLET BY MOUTH TWICE DAILY   Pen Needles 31G X 6 MM Misc Use as directed to check blood sugar 4 times a day.       Allergies: No Known Allergies  Family History: Family History  Problem Relation Age of Onset  . Lupus Mother   . CAD Brother   . Hypertension Brother   . Colon cancer Neg Hx   . Esophageal cancer Neg Hx   . Rectal cancer Neg Hx   .  Stomach cancer Neg Hx     Social History:  reports that he quit smoking about 4 years ago. His smoking use included cigarettes. He started smoking about 61 years ago. He has a 57.00 pack-year smoking history. He has never used smokeless tobacco. He reports that he does not drink alcohol and does not use drugs.  ROS: All other review of systems were reviewed and are negative except what is noted above in HPI  Physical Exam: BP (!) 169/73   Pulse 87   Temp 98 F (36.7 C)   Ht 5' 9"  (1.753 m)   Wt 137 lb (62.1 kg)   BMI 20.23 kg/m   Constitutional:  Alert and oriented, No acute distress. HEENT: [place] AT, moist mucus membranes.  Trachea midline, no masses. Cardiovascular: No clubbing, cyanosis, or edema. Respiratory: Normal respiratory effort, no increased work of breathing. GI: Abdomen is soft, nontender, nondistended, no abdominal masses GU: No CVA tenderness.  Lymph: No cervical or inguinal lymphadenopathy. Skin: No rashes, bruises or suspicious lesions. Neurologic: Grossly intact, no focal deficits, moving all 4  extremities. Psychiatric: Normal mood and affect.  Laboratory Data: Lab Results  Component Value Date   WBC 6.5 05/26/2020   HGB 11.9 (L) 05/26/2020   HCT 37.9 (L) 05/26/2020   MCV 96.4 05/26/2020   PLT 136 (L) 05/26/2020    Lab Results  Component Value Date   CREATININE 1.67 (H) 01/09/2020    No results found for: PSA  No results found for: TESTOSTERONE  Lab Results  Component Value Date   HGBA1C 7.2 (A) 05/21/2020    Urinalysis    Component Value Date/Time   COLORURINE YELLOW 12/30/2019 1925   APPEARANCEUR CLEAR 12/30/2019 1925   LABSPEC 1.014 12/30/2019 1925   PHURINE 5.0 12/30/2019 1925   GLUCOSEU NEGATIVE 12/30/2019 1925   HGBUR NEGATIVE 12/30/2019 [place] NEGATIVE 12/30/2019 1925   KETONESUR 5 (A) 12/30/2019 1925   PROTEINUR NEGATIVE 12/30/2019 1925   NITRITE NEGATIVE 12/30/2019 1925   LEUKOCYTESUR TRACE (A) 12/30/2019 1925    Lab Results  Component Value Date   BACTERIA NONE SEEN 12/30/2019    Pertinent Imaging: CXR 11/19: Images reviewed and discussed with the patient No results found for this or any previous visit.  No results found for this or any previous visit.  No results found for this or any previous visit.  No results found for this or any previous visit.  No results found for this or any previous visit.  No results found for this or any previous visit.  No results found for this or any previous visit.  Results for orders placed during the hospital encounter of 12/30/19  CT RENAL STONE STUDY  Narrative CLINICAL DATA:  Flank pain. Dizziness and weakness. Where a electronic medical record abscess history of renal cell carcinoma and pancreatic cancer. Post Whipple procedure and left nephrectomy.  EXAM: CT ABDOMEN AND PELVIS WITHOUT CONTRAST  TECHNIQUE: Multidetector CT imaging of the abdomen and pelvis was performed following the standard protocol without IV contrast.  COMPARISON:  Most recent CT 08/05/2019.  Contrast-enhanced exam 12/25/2017  FINDINGS: Lower chest: Heart is normal in size. Coronary artery calcifications. Emphysema. Stable peribronchovascular nodularity in the left lower lobe consistent with post infectious or inflammatory scarring.  Hepatobiliary: Pneumobilia in the left lobe, likely postprocedural. No evidence of focal hepatic lesion on noncontrast exam. Clips in the gallbladder fossa postcholecystectomy. No biliary dilatation.  Pancreas: Not well-defined on the current exam given lack of contrast and paucity  of intra-abdominal fat. There are surgical clips in the expected location of the pancreatic head.  Spleen: Normal in size. No evidence of focal abnormality.  Adrenals/Urinary Tract: Post left nephrectomy. Left adrenal gland is not visualized. Normal right adrenal gland. Nonobstructing stone in the lower right kidney. Small low-attenuation lesion in the posterior right kidney is too small to characterize but likely small cyst. No right hydronephrosis. Urinary bladder distended. No bladder wall thickening. Possible punctate stone in the dependent bladder, series 2, image 70.  Stomach/Bowel: Bowel evaluation is limited in the absence of contrast and paucity of intra-abdominal fat. Postsurgical change involving the stomach and small bowel. No small bowel obstruction or evidence of inflammation. Fecalization of distal small bowel contents. Appendix not visualized, no evidence of appendicitis. Small to moderate volume of stool in the colon. Descending and sigmoid diverticulosis without diverticulitis. No colonic inflammation.  Vascular/Lymphatic: Advanced aorta bi-iliac atherosclerosis. Fusiform ectasia of the infrarenal aorta maximal dimension 3.0 cm aneurysmal dilatation of the right common iliac artery measuring 2.2 cm proximally and 3.4 cm distally, unchanged. No adjacent stranding. Aneurysm at the internal external bifurcation at 2.4 cm. There is branch  vessel atherosclerosis. No definite enlarged abdominopelvic lymph nodes allowing for limited limitations related to lack of contrast and paucity of body fat.  Reproductive: Prominent prostate gland of 4.9 cm.  Other: Stable focal rounded haziness in the right omentum, series 2, image 33. tiny adjacent nodule measuring 5 mm, series 2, image 32. No ascites. No free air.  Musculoskeletal: Chronic L2 compression fracture. Bones are diffusely under mineralized. Multilevel degenerative change in the spine. No evidence of focal bone lesion.  IMPRESSION: 1. Distended urinary bladder with possible punctate stone dependently. Nonobstructing stone in the right kidney. No hydronephrosis or obstructive uropathy. 2. Colonic diverticulosis without diverticulitis. 3. Stable focal rounded haziness in the right omentum since 08/05/2019 CT. There is a tiny 5 mm adjacent soft tissue nodule, that is nonspecific, better seen on the current exam. Recommend attention on follow-up. 4. Post left nephrectomy. Post reported Whipple procedure. Pneumobilia is likely postprocedural. 5. Aortic and right iliac aneurysms, not significantly changed from prior exam. Aortic aneurysm up to 3 cm, with right iliac aneurysm up to 3.4 cm. Recommend followup by ultrasound in 3 years. This recommendation follows ACR consensus guidelines: White Paper of the ACR Incidental Findings Committee II on Vascular Findings. J Am Coll Radiol 2013; 10:789-794. Aortic aneurysm NOS (ICD10-I71.9)  Aortic Atherosclerosis (ICD10-I70.0)   Electronically Signed By: Keith Rake M.D. On: 12/31/2019 00:12   Assessment & Plan:    1. Renal cell carcinoma, unspecified laterality ([place]) -RTC 1 year with CXR and CMP - Urinalysis, Routine w reflex microscopic   No follow-ups on file.  Nicolette Bang, MD  [facility] [place]

## 2020-08-13 NOTE — Patient Instructions (Signed)
Kidney Cancer  Kidney cancer is an abnormal growth of cells in one or both kidneys. The kidneys filter waste from your blood and produce urine. Kidney cancer may spread to other parts of your body. This type of cancer may also be called renal cell carcinoma. What are the causes? The cause of this condition is not always known. In some cases, abnormal changes to genes (genetic mutations) can cause cells to form cancer. What increases the risk? You may be more likely to develop kidney cancer if you:  Are over age 37. The risk increases with age.  Have a family history of kidney cancer.  Are of African-American, Native American, or Native [place] descent.  Smoke.  Are male.  Are obese.  Have high blood pressure (hypertension).  Have advanced kidney disease, especially if you need long-term dialysis.  Have certain conditions that are passed from parent to child (inherited), such as von Hippel-Lindau disease, tuberous sclerosis, or hereditary papillary renal carcinoma.  Have been exposed to certain chemicals. What are the signs or symptoms? In the early stages, kidney cancer does not cause symptoms. As the cancer grows, symptoms may include:  Blood in the urine.  Pain in the upper back or abdomen, just below the rib cage. You may feel pain on one or both sides of the body.  Fatigue.  Unexplained weight loss.  Fever. How is this diagnosed? This condition may be diagnosed based on:  Your symptoms and medical history.  A physical exam.  Blood and urine tests.  X-rays.  Imaging tests, such as CT scans, MRIs, and PET scans.  Having dye injected into your blood through an IV, and then having X-rays taken of: ? Your kidneys and the rest of the organs involved in making and storing urine (intravenous pyelogram). ? Your blood vessels (angiogram).  Removal and testing of a kidney tissue sample (biopsy). Your cancer will be assessed (staged), based on how severe it is and how  much it has spread. How is this treated? Treatment depends on the type and stage of the cancer. Treatment may include one or more of the following:  Surgery. This may include surgery to remove: ? Just the tumor (nephron-sparing surgery). ? The entire kidney (nephrectomy). ? The kidney, some of the surrounding healthy tissue, nearby lymph nodes, and the adrenal gland in certain cases (radical nephrectomy).  Medicines that kill cancer cells (chemotherapy).  High-energy rays that kill cancer cells (radiation therapy).  Targeted therapy. This targets specific parts of cancer cells and the area around them to block the growth and the spread of the cancer. Targeted therapy can help to limit the damage to healthy cells.  Medicines that help your body's disease-fighting system (immune system) fight cancer cells (immunotherapy).  Freezing cancer cells using gas or liquid that is delivered through a needle (cryoablation).  Destroying cancer cells using high-energy radio waves that are delivered through a needle-like probe (radiofrequency ablation).  A procedure to block the artery that supplies blood to the tumor, which kills the cancer cells (embolization). Follow these instructions at home: Eating and drinking  Some of your treatments might affect your appetite and your ability to chew and swallow. If you are having problems eating, or if you do not have an appetite, meet with a diet and nutrition specialist (dietitian).  If you have side effects that affect eating, it may help to: ? Eat smaller meals and snacks often. ? Drink high-nutrition and high-calorie shakes or supplements. ? Eat bland and soft  foods that are easy to eat. ? Not eat foods that are hot, spicy, or hard to swallow. Lifestyle  Do not drink alcohol.  Do not use any products that contain nicotine or tobacco, such as cigarettes and e-cigarettes. If you need help quitting, ask your health care provider. General  instructions   Take over-the-counter and prescription medicines only as told by your health care provider. This includes vitamins, supplements, and herbal products.  Consider joining a support group to help you cope with the stress of having kidney cancer.  Work with your health care provider to manage any side effects of treatment.  Keep all follow-up visits as told by your health care provider. This is important. Where to find more information  American Cancer Society: https://www.cancer.[place] ([place]): https://www.cancer.gov Contact a health care provider if you:  Notice that you bruise or bleed easily.  Are losing weight without trying.  Have new or increased fatigue or weakness. Get help right away if you have:  Blood in your urine.  A sudden increase in pain.  A fever.  Shortness of breath.  Chest pain.  Yellow skin or whites of your eyes (jaundice). Summary  Kidney cancer is an abnormal growth of cells (tumor) in one or both kidneys. Tumors may spread to other parts of your body.  In the early stages, kidney cancer does not cause symptoms. As the cancer grows, symptoms may include blood in the urine, pain in the upper back or abdomen, unexplained weight loss, fatigue, and fever.  Treatment depends on the type and stage of the cancer. It may include surgery to remove the tumor, procedures and medicines to kill the cancer cells, or medicines to help your body fight cancer cells. This information is not intended to replace advice given to you by your health care provider. Make sure you discuss any questions you have with your health care provider. Document Revised: 09/20/2017 Document Reviewed: 09/17/2017 Elsevier Patient Education  [place].

## 2020-08-25 ENCOUNTER — Other Ambulatory Visit: Payer: Self-pay

## 2020-08-25 ENCOUNTER — Inpatient Hospital Stay: Payer: Medicare Other

## 2020-08-25 ENCOUNTER — Inpatient Hospital Stay: Payer: Medicare Other | Attending: Oncology | Admitting: Oncology

## 2020-08-25 VITALS — BP 140/82 | HR 80 | Temp 97.3°F | Resp 16 | Ht 69.0 in | Wt 132.6 lb

## 2020-08-25 DIAGNOSIS — Z8507 Personal history of malignant neoplasm of pancreas: Secondary | ICD-10-CM | POA: Diagnosis not present

## 2020-08-25 DIAGNOSIS — C250 Malignant neoplasm of head of pancreas: Secondary | ICD-10-CM

## 2020-08-25 DIAGNOSIS — Z905 Acquired absence of kidney: Secondary | ICD-10-CM | POA: Diagnosis not present

## 2020-08-25 DIAGNOSIS — I252 Old myocardial infarction: Secondary | ICD-10-CM | POA: Insufficient documentation

## 2020-08-25 DIAGNOSIS — Z87891 Personal history of nicotine dependence: Secondary | ICD-10-CM | POA: Insufficient documentation

## 2020-08-25 DIAGNOSIS — Z85528 Personal history of other malignant neoplasm of kidney: Secondary | ICD-10-CM | POA: Diagnosis not present

## 2020-08-25 DIAGNOSIS — I2510 Atherosclerotic heart disease of native coronary artery without angina pectoris: Secondary | ICD-10-CM | POA: Insufficient documentation

## 2020-08-25 DIAGNOSIS — N289 Disorder of kidney and ureter, unspecified: Secondary | ICD-10-CM | POA: Insufficient documentation

## 2020-08-25 DIAGNOSIS — D696 Thrombocytopenia, unspecified: Secondary | ICD-10-CM | POA: Diagnosis not present

## 2020-08-25 DIAGNOSIS — Z9221 Personal history of antineoplastic chemotherapy: Secondary | ICD-10-CM | POA: Insufficient documentation

## 2020-08-25 DIAGNOSIS — E119 Type 2 diabetes mellitus without complications: Secondary | ICD-10-CM | POA: Diagnosis not present

## 2020-08-25 DIAGNOSIS — I25119 Atherosclerotic heart disease of native coronary artery with unspecified angina pectoris: Secondary | ICD-10-CM | POA: Diagnosis not present

## 2020-08-25 LAB — CBC WITH DIFFERENTIAL (CANCER CENTER ONLY)
Abs Immature Granulocytes: 0.01 10*3/uL (ref 0.00–0.07)
Basophils Absolute: 0 10*3/uL (ref 0.0–0.1)
Basophils Relative: 0 %
Eosinophils Absolute: 0.1 10*3/uL (ref 0.0–0.5)
Eosinophils Relative: 1 %
HCT: 38.8 % — ABNORMAL LOW (ref 39.0–52.0)
Hemoglobin: 12.6 g/dL — ABNORMAL LOW (ref 13.0–17.0)
Immature Granulocytes: 0 %
Lymphocytes Relative: 15 %
Lymphs Abs: 0.9 10*3/uL (ref 0.7–4.0)
MCH: 31.3 pg (ref 26.0–34.0)
MCHC: 32.5 g/dL (ref 30.0–36.0)
MCV: 96.3 fL (ref 80.0–100.0)
Monocytes Absolute: 0.7 10*3/uL (ref 0.1–1.0)
Monocytes Relative: 11 %
Neutro Abs: 4.4 10*3/uL (ref 1.7–7.7)
Neutrophils Relative %: 73 %
Platelet Count: 127 10*3/uL — ABNORMAL LOW (ref 150–400)
RBC: 4.03 MIL/uL — ABNORMAL LOW (ref 4.22–5.81)
RDW: 14.6 % (ref 11.5–15.5)
WBC Count: 6.1 10*3/uL (ref 4.0–10.5)
nRBC: 0 % (ref 0.0–0.2)

## 2020-08-25 LAB — FERRITIN: Ferritin: 45 ng/mL (ref 24–336)

## 2020-08-25 NOTE — Progress Notes (Signed)
[facility] OFFICE PROGRESS NOTE   Diagnosis: Pancreas cancer  INTERVAL HISTORY:   Manuel Olson returns as scheduled.  He feels well.  Good appetite.  No bleeding.  No complaint.  He is taking iron.  Objective:  Vital signs in last 24 hours:  Blood pressure 140/82, pulse 80, temperature (!) 97.3 F (36.3 C), temperature source Tympanic, resp. rate 16, height 5' 9"  (1.753 m), weight 132 lb 9.6 oz (60.1 kg), SpO2 100 %.    Lymphatics: No cervical, supraclavicular, axillary, or inguinal nodes Resp: Lungs clear bilaterally Cardio: Regular rate and rhythm GI: No hepatosplenomegaly, nontender, no mass Vascular: No leg edema   Lab Results:  Lab Results  Component Value Date   WBC 6.1 08/25/2020   HGB 12.6 (L) 08/25/2020   HCT 38.8 (L) 08/25/2020   MCV 96.3 08/25/2020   PLT 127 (L) 08/25/2020   NEUTROABS 4.4 08/25/2020    CMP  Lab Results  Component Value Date   NA 137 01/09/2020   K 4.4 01/09/2020   CL 104 01/09/2020   CO2 27 01/09/2020   GLUCOSE 123 (H) 01/09/2020   BUN 28 (H) 01/09/2020   CREATININE 1.67 (H) 01/09/2020   CALCIUM 8.5 (L) 01/09/2020   PROT 5.6 (L) 01/09/2020   ALBUMIN 2.8 (L) 01/09/2020   AST 16 01/09/2020   ALT 14 01/09/2020   ALKPHOS 100 01/09/2020   BILITOT 0.2 (L) 01/09/2020   GFRNONAA 39 (L) 01/09/2020   GFRAA 45 (L) 01/09/2020     Medications: I have reviewed the patient's current medications.   Assessment/Plan: 1. Adenocarcinoma of the pancreas head/neck, status post an EUS biopsy 08/02/2017 confirming adenocarcinoma ? EUS 08/02/2017-pancreas head/neck mass, abutment of the portal vein, no peripancreatic adenopathy ? MRI abdomen 07/27/2017-head/neck pancreas mass with mass-effect on the portal splenic confluence, no involvement of the celiac axis or superior mesenteric artery, no evidence of metastatic disease ? PET scan 08/31/2017-very hypermetabolism at the pancreatic head mass, no evidence of metastatic disease.  The isolated right lower lobe nodule below PET resolution, left sided renal mass with decreased activity relative to the normal adjacent kidney ? Cycle 1 FOLFIRINOX 09/16/2017 ? Cycle 2 FOLFOX 10/16/2017 ? Cycle 3 FOLFOX 10/30/2017 ? Cycle 4 FOLFIRINOX 11/14/2017 (irinotecan resumed with a dose reduction) ? Cycle 5 FOLFIRINOX3/20/2019 (Oxaliplatin dose reduced due to thrombocytopenia) ? Cycle 6 FOLFIRINOX4/11/2017 ? CTs 12/25/2017-stable right lower lobe nodule, decrease in size of the pancreas head mass, stable left kidney mass ? Pancreaticoduodenectomy and left nephrectomy 02/19/2018,[id number], 2/18 lymph nodes positive, lymphovascular and perineural invasion present, mild to moderate treatment effect, tumor involved adherent portal vein with negative portal vein margins ? Cycle 1 adjuvant gemcitabine/Abraxane 04/25/2018 ? Cycle 2 adjuvant gemcitabine/Abraxane 05/16/2018 ? Cycle 3 adjuvant gemcitabine/Abraxane 05/31/2018 ? Cycle 4 adjuvant gemcitabine/Abraxane 06/13/2018 ? Cycle 5 adjuvant gemcitabine/Abraxane 06/27/2018 ? Cycle 6 adjuvant gemcitabine/Abraxane 07/11/2018  2. 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  5.Diabetes  6.History of tobacco use  7.Hypertension  8.Port-A-Cath placement 09/15/2017, Dr. Barry Dienes  9. Diarrhea-potentially related to pancreas insufficiency, he began a trial of pancreatic enzyme replacement 05/16/2018: Diarrhea resolved  10. 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  Hospitalization with GI bleed 12/30/2019 through 01/05/2020.  Upper endoscopy on 01/02/2020 showed an actively bleeding anastomotic ulcer.  Upper endoscopy 03/06/2020-ulcer completely healed.      Disposition: Manuel Olson is in remission from pancreas cancer.  The hemoglobin is stable.  He will continue iron.  We will follow up on the ferritin level from today.  He will return for an office and lab visit in 6 months.  [name], MD  08/25/2020  10:54 AM

## 2020-08-26 LAB — CANCER ANTIGEN 19-9: CA 19-9: 13 U/mL (ref 0–35)

## 2020-09-03 ENCOUNTER — Telehealth: Payer: Self-pay

## 2020-09-03 NOTE — Telephone Encounter (Signed)
Inbound fax from [facility] requesting a standard written order for a CGM. Form completed and faxed to [phone]

## 2020-09-24 ENCOUNTER — Telehealth: Payer: Self-pay | Admitting: *Deleted

## 2020-09-24 NOTE — Telephone Encounter (Signed)
Completed [employer] Assist form for Creon. Copy to HIM for scanning and mailed completed form to patient. Wife notified.

## 2020-09-25 ENCOUNTER — Other Ambulatory Visit: Payer: Self-pay

## 2020-09-29 ENCOUNTER — Ambulatory Visit: Payer: Medicare Other | Admitting: Internal Medicine

## 2020-10-05 ENCOUNTER — Telehealth: Payer: Self-pay

## 2020-10-05 NOTE — Chronic Care Management (AMB) (Signed)
Chronic Care Management Pharmacy Assistant   Name: Manuel Olson  MRN: [id number] DOB: 03-28-1946  Reason for Encounter: Disease State/ Hypertension Adherence Call  Patient Questions:  Have you had any recent visits? 07/13/2020 OV Cardiology, Satira Sark, MD, 08/13/2020 OV Urology, McKenzie, Candee Furbish, MD, 08/25/2020 OV Oncology Ladell Pier, MD, no medication changes noted.  PCP : [name], MD  Allergies:  No Known Allergies  Medications: Outpatient Encounter Medications as of 10/05/2020  Medication Sig  . atorvastatin (LIPITOR) 20 MG tablet TAKE 1 TABLET BY MOUTH ONCE DAILY.  [name] clopidogrel (PLAVIX) 75 MG tablet TAKE 1 TABLET BY MOUTH ONCE DAILY WITH BREAKFAST.  [name] CREON 36000 units CPEP capsule TAKE 1 CAPSULE BY MOUTH BEFORE EACH MEAL AND SNACK. TAKE UP TO 6 CAPSULES PER DAY.  . ferrous sulfate 325 (65 FE) MG tablet Take 325 mg by mouth daily with breakfast.  . Insulin Glargine (BASAGLAR KWIKPEN) 100 UNIT/ML 11 units in the morning  . insulin lispro (HUMALOG KWIKPEN) 100 UNIT/ML KwikPen Inject 0.05-0.08 mLs (5-8 Units total) into the skin 3 (three) times daily before meals. Inject 5-8 units 3 times a day with meals.  . Insulin Pen Needle (PEN NEEDLES) 31G X 6 MM MISC Use as directed to check blood sugar 4 times a day.  . pantoprazole (PROTONIX) 40 MG tablet TAKE 1 TABLET BY MOUTH TWICE DAILY   No facility-administered encounter medications on file as of 10/05/2020.    Current Diagnosis: Patient Active Problem List   Diagnosis Date Noted  . Renal cell carcinoma ([place]) 08/13/2020  . Anastomotic ulcer 01/21/2020  . Long term (current) use of antithrombotics/antiplatelets 01/21/2020  . Upper gastrointestinal bleed 12/31/2019  . Acute GI bleeding 12/30/2019  . Angina pectoris ([place]) 08/22/2019  . Protein-calorie malnutrition, severe 04/05/2018  . Debility 02/27/2018  . Port-A-Cath in place 10/10/2017  . Renal cell carcinoma of right kidney [facility]) s/p  nephrectomy 02/2018 10/02/2017  . Ileitis, terminal ([place]) 09/28/2017  . Insulin dependent type 2 diabetes mellitus ([place]) 08/25/2017  . Mixed hyperlipidemia 08/25/2017  . Essential hypertension, benign   . CAD (coronary artery disease)   . Former smoker   . Severe AS S/P TAVR  08/22/2017  . Cancer of head of pancreas [facility]) s/p Whipple 02/2018 08/10/2017    Reviewed chart prior to disease state call. Spoke with patient regarding BP  Recent Office Vitals: BP Readings from Last 3 Encounters:  08/25/20 140/82  08/13/20 (!) 169/73  07/13/20 128/80   Pulse Readings from Last 3 Encounters:  08/25/20 80  08/13/20 87  07/13/20 83    Wt Readings from Last 3 Encounters:  08/25/20 132 lb 9.6 oz (60.1 kg)  08/13/20 137 lb (62.1 kg)  07/13/20 137 lb (62.1 kg)     Kidney Function Lab Results  Component Value Date/Time   CREATININE 1.67 (H) 01/09/2020 01:48 PM   CREATININE 1.85 (H) 01/01/2020 06:58 PM   CREATININE 1.59 (H) 12/31/2019 08:14 AM   CREATININE 1.67 (H) 08/27/2019 09:43 AM   CREATININE 1.2 09/07/2017 08:44 AM   GFR 45.15 (L) 10/09/2019 10:19 AM   GFRNONAA 39 (L) 01/09/2020 01:48 PM   GFRAA 45 (L) 01/09/2020 01:48 PM    BMP Latest Ref Rng & Units 01/09/2020 01/01/2020 12/31/2019  Glucose 70 - 99 mg/dL 123(H) 146(H) 175(H)  BUN 8 - 23 mg/dL 28(H) 54(H) 55(H)  Creatinine 0.61 - 1.24 mg/dL 1.67(H) 1.85(H) 1.59(H)  Sodium 135 - 145 mmol/L 137 136 136  Potassium 3.5 -  5.1 mmol/L 4.4 4.4 4.3  Chloride 98 - 111 mmol/L 104 107 110  CO2 22 - 32 mmol/L 27 21(L) 19(L)  Calcium 8.9 - 10.3 mg/dL 8.5(L) 7.9(L) 7.2(L)    . Current antihypertensive regimen:  o Patient not currently taking any antihypertensive medications at this time.  . How often are you checking your Blood Pressure? 1-2x per week   . Current home BP readings: Patient's wife states they check the patient's blood pressure a few times a week. She states they do not keep up with his readings. Patient's wife ensures his  readings at home are within normal range.  . What recent interventions/DTPs have been made by any provider to improve Blood Pressure control since last CPP Visit: Patient does not currently taking any antihypertensive medications at this time.  . Any recent hospitalizations or ED visits since last visit with CPP? No , patient has not had any recent hospitalizations or ED visits since his last visit with Madelin Rear, Pharm.D., BCGP.  [name] What diet changes have been made to improve Blood Pressure Control?  o Patient wife states patient does not follow any diet at this time.  . What exercise is being done to improve your Blood Pressure Control?  o Patient wife states patient does not follow any exercise routine at this time.  Adherence Review: Is the patient currently on ACE/ARB medication? No Does the patient have >5 day gap between last estimated fill dates? No   April D [place], [place] Pharmacist Assistant [phone]   Follow-Up:  Pharmacist Review

## 2020-10-07 ENCOUNTER — Ambulatory Visit (INDEPENDENT_AMBULATORY_CARE_PROVIDER_SITE_OTHER): Payer: Medicare Other | Admitting: Gastroenterology

## 2020-10-07 ENCOUNTER — Encounter: Payer: Self-pay | Admitting: Gastroenterology

## 2020-10-07 ENCOUNTER — Other Ambulatory Visit: Payer: Self-pay

## 2020-10-07 ENCOUNTER — Other Ambulatory Visit (INDEPENDENT_AMBULATORY_CARE_PROVIDER_SITE_OTHER): Payer: Medicare Other

## 2020-10-07 DIAGNOSIS — R109 Unspecified abdominal pain: Secondary | ICD-10-CM

## 2020-10-07 DIAGNOSIS — Z8507 Personal history of malignant neoplasm of pancreas: Secondary | ICD-10-CM

## 2020-10-07 LAB — COMPREHENSIVE METABOLIC PANEL
ALT: 11 U/L (ref 0–53)
AST: 16 U/L (ref 0–37)
Albumin: 4.2 g/dL (ref 3.5–5.2)
Alkaline Phosphatase: 99 U/L (ref 39–117)
BUN: 25 mg/dL — ABNORMAL HIGH (ref 6–23)
CO2: 28 mEq/L (ref 19–32)
Calcium: 9.6 mg/dL (ref 8.4–10.5)
Chloride: 100 mEq/L (ref 96–112)
Creatinine, Ser: 1.59 mg/dL — ABNORMAL HIGH (ref 0.40–1.50)
GFR: 41.76 mL/min — ABNORMAL LOW (ref >60.00)
Glucose, Bld: 104 mg/dL — ABNORMAL HIGH (ref 70–99)
Potassium: 4.1 mEq/L (ref 3.5–5.1)
Sodium: 135 mEq/L (ref 135–145)
Total Bilirubin: 0.5 mg/dL (ref 0.2–1.2)
Total Protein: 7.1 g/dL (ref 6.0–8.3)

## 2020-10-07 LAB — CBC
HCT: 39.6 % (ref 39.0–52.0)
Hemoglobin: 12.9 g/dL — ABNORMAL LOW (ref 13.0–17.0)
MCHC: 32.6 g/dL (ref 30.0–36.0)
MCV: 95 fl (ref 78.0–100.0)
Platelets: 137 10*3/uL — ABNORMAL LOW (ref 150.0–400.0)
RBC: 4.17 Mil/uL — ABNORMAL LOW (ref 4.22–5.81)
RDW: 14.9 % (ref 11.5–15.5)
WBC: 6.2 10*3/uL (ref 4.0–10.5)

## 2020-10-07 LAB — PROTIME-INR
INR: 1 ratio (ref 0.8–1.0)
Prothrombin Time: 11.5 s (ref 9.6–13.1)

## 2020-10-07 NOTE — Patient Instructions (Signed)
If you are age 77 or older, your body mass index should be between 23-30. Your Body mass index is 18.87 kg/m. If this is out of the aforementioned range listed, please consider follow up with your Primary Care Provider.  Your provider has requested that you go to the basement level for lab work before leaving today. Press "B" on the elevator. The lab is located at the first door on the left as you exit the elevator.  You have been scheduled for a CT scan of the abdomen and pelvis at [facility], 1st floor Radiology. You are scheduled on 10-15-2020  at 12:00pm. You should arrive 15 minutes prior to your appointment time for registration.  Please pick up 2 bottles of contrast from [place] at least 3 days prior to your scan. The solution may taste better if refrigerated, but do NOT add ice or any other liquid to this solution. Shake well before drinking.    Please follow the written instructions below on the day of your exam:   1) Do not eat anything after 8am (4 hours prior to your test)   2) Drink 1 bottle of contrast @ 10am (2 hours prior to your exam)  Remember to shake well before drinking and do NOT pour over ice.     Drink 1 bottle of contrast @ 11am (1 hour prior to your exam)   You may take any medications as prescribed with a small amount of water, if necessary. If you take any of the following medications: METFORMIN, GLUCOPHAGE, GLUCOVANCE, AVANDAMET, RIOMET, FORTAMET, [place] MET, JANUMET, GLUMETZA or METAGLIP, you MAY be asked to HOLD this medication 48 hours AFTER the exam.   The purpose of you drinking the oral contrast is to aid in the visualization of your intestinal tract. The contrast solution may cause some diarrhea. Depending on your individual set of symptoms, you may also receive an intravenous injection of x-ray contrast/dye. Plan on being at [facility] for 45 minutes or longer, depending on the type of exam you are having performed.   If you have any questions  regarding your exam or if you need to reschedule, you may call [facility] at [phone] between the hours of 8:00 am and 5:00 pm, Monday-Friday.   Due to recent changes in healthcare laws, you may see the results of your imaging and laboratory studies on MyChart before your provider has had a chance to review them.  We understand that in some cases there may be results that are confusing or concerning to you. Not all laboratory results come back in the same time frame and the provider may be waiting for multiple results in order to interpret others.  Please give [place] 48 hours in order for your provider to thoroughly review all the results before contacting the office for clarification of your results.   Thank you for entrusting me with your care and choosing [facility].  Dr [name]

## 2020-10-07 NOTE — Progress Notes (Signed)
Review of pertinent gastrointestinal problems: 1. 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  Adjuvant chemotherapy completed 06/2018. Dr. Benay Spice. 2.  Upper GI bleeding from [place] anastomotic ulcer EGD while hospitalized April 2021, H. pylori biopsies negative.  Repeat EGD June 2021 showed typical Whipple anatomy, adherent endoclips at [place] anastomosis without any sign of the previously noted anastomotic ulcer.  He was recommended to stay on proton pump inhibitor twice daily and iron once daily.   HPI: This is a very pleasant 76 year old man whom I last saw this past summer for repeat EGD for anastomotic ulcer.  His weight is down 4 pounds since his last office visit here about 8 months ago. Same scale.  For 3 to 4 weeks he has had twisting abdominal pains, several times a day, lasting a few minutes to a few hours.  This usually happens after eating, it is not associate with vomiting.  He does not take NSAIDs.  He only takes Tylenol.  He has been religious about taking his proton pump inhibitor twice daily and avoiding NSAIDs.  He has also been very good about taking his iron pill once daily.  His bowels have been moving normally.  He has not seen any dark stools or overtly bloody stools   ROS: complete GI ROS as described in HPI, all other review negative.  Constitutional:  No unintentional weight loss   Past Medical History:  Diagnosis Date  . Arthritis    back  . Blood transfusion without reported diagnosis   . CAD (coronary artery disease)    a. 11/2016 in [place]: STEMI s/p DES to mid LAD, DES to diagonal, DES x 2 to proximal and mid RCA b. 08/2019: NSTEMI with DES to distal RCA  . Essential hypertension   . Full dentures   . GERD (gastroesophageal reflux disease)   . History of  stroke   . Hyperlipidemia   . Myocardial infarction ([place])   . Pancreatic cancer ([place])   . Pneumonia   . Renal cell carcinoma ([place])   . S/P TAVR (transcatheter aortic valve replacement)    a. 08/2017:  Edwards Sapien 3 THV (size 26 mm, model #9600CM26A , serial # [id number])  . Severe aortic stenosis    a. 08/2017: s/p TAVR by Dr. Angelena Form and Dr. Cyndia Bent.   . Type 2 diabetes mellitus ([place])   . Wears glasses   . Wears hearing aid     Past Surgical History:  Procedure Laterality Date  . APPENDECTOMY    . CARDIAC CATHETERIZATION    . CORONARY ANGIOGRAPHY N/A 08/23/2019   Procedure: CORONARY ANGIOGRAPHY;  Surgeon: Belva Crome, MD;  Location: [place] CV LAB;  Service: Cardiovascular;  Laterality: N/A;  . CORONARY STENT INTERVENTION N/A 08/23/2019   Procedure: CORONARY STENT INTERVENTION;  Surgeon: Belva Crome, MD;  Location: [place] CV LAB;  Service: Cardiovascular;  Laterality: N/A;  . CORONARY STENT PLACEMENT     x 4 in March 2018  . ESOPHAGOGASTRODUODENOSCOPY (EGD) WITH PROPOFOL N/A 01/02/2020   Procedure: ESOPHAGOGASTRODUODENOSCOPY (EGD) WITH PROPOFOL;  Surgeon: Jackquline Denmark, MD;  [facility]: [facility];  Service: Endoscopy;  Laterality: N/A;  . EUS N/A 08/02/2017   Procedure: UPPER ENDOSCOPIC ULTRASOUND (EUS) RADIAL;  Surgeon: [name], MD;  [facility]: [facility];  Service: Endoscopy;  Laterality: N/A;  . EYE SURGERY Left   . HAND SURGERY  Bilateral   . HEMOSTASIS CLIP PLACEMENT  01/02/2020   Procedure: HEMOSTASIS CLIP PLACEMENT;  Surgeon: Jackquline Denmark, MD;  [facility];  Service: Endoscopy;;  . HERNIA REPAIR Right   . HOT HEMOSTASIS N/A 01/02/2020   Procedure: HOT HEMOSTASIS (ARGON PLASMA COAGULATION/BICAP);  Surgeon: Jackquline Denmark, MD;  Location: [facility];  Service: Endoscopy;  Laterality: N/A;  . LAPAROSCOPY N/A 02/19/2018   Procedure: DIAGNOSTIC LAPAROSCOPY, ERAS PATHWAY;  Surgeon: [name], MD;  Location: [place];  Service: General;   Laterality: N/A;  . MULTIPLE TOOTH EXTRACTIONS    . NEPHRECTOMY Left 02/19/2018   Procedure: OPEN LEFT RADICAL NEPHRECTOMY;  Surgeon: Cleon Gustin, MD;  Location: [place];  Service: Urology;  Laterality: Left;  . PORT-A-CATH REMOVAL N/A 06/13/2019   Procedure: PORT REMOVAL;  Surgeon: [name], MD;  Location: [facility];  Service: General;  Laterality: N/A;  . PORTACATH PLACEMENT N/A 09/15/2017   Procedure: INSERTION PORT-A-CATH;  Surgeon: [name], MD;  Location: [place];  Service: General;  Laterality: N/A;  . RIGHT/LEFT HEART CATH AND CORONARY ANGIOGRAPHY N/A 07/05/2017   Procedure: RIGHT/LEFT HEART CATH AND CORONARY ANGIOGRAPHY;  Surgeon: [name], MD;  Location: [place] CV LAB;  Service: Cardiovascular;  Laterality: N/A;  . SCLEROTHERAPY  01/02/2020   Procedure: SCLEROTHERAPY;  Surgeon: Jackquline Denmark, MD;  [facility];  Service: Endoscopy;;  . SHOULDER ARTHROSCOPY WITH ROTATOR CUFF REPAIR Left   . TEE WITHOUT CARDIOVERSION N/A 08/22/2017   Procedure: TRANSESOPHAGEAL ECHOCARDIOGRAM (TEE);  Surgeon: Burnell Blanks, MD;  Location: [place] [facility];  Service: Open Heart Surgery;  Laterality: N/A;  . TRANSCATHETER AORTIC VALVE REPLACEMENT, TRANSFEMORAL N/A 08/22/2017   Procedure: TRANSCATHETER AORTIC VALVE REPLACEMENT, TRANSFEMORAL;  Surgeon: Burnell Blanks, MD;  Location: [place];  Service: Open Heart Surgery;  Laterality: N/A;  . UPPER GASTROINTESTINAL ENDOSCOPY    . WHIPPLE PROCEDURE N/A 02/19/2018   Procedure: WHIPPLE PROCEDURE;  Surgeon: [name], MD;  Location: [place];  Service: General;  Laterality: N/A;    Current Outpatient Medications  Medication Sig Dispense Refill  . atorvastatin (LIPITOR) 20 MG tablet TAKE 1 TABLET BY MOUTH ONCE DAILY. 90 tablet 3  . clopidogrel (PLAVIX) 75 MG tablet TAKE 1 TABLET BY MOUTH ONCE DAILY WITH BREAKFAST. 90 tablet 3  . CREON 36000 units CPEP capsule TAKE 1 CAPSULE BY MOUTH BEFORE EACH MEAL AND  SNACK. TAKE UP TO 6 CAPSULES PER DAY. 90 capsule 0  . ferrous sulfate 325 (65 FE) MG tablet Take 325 mg by mouth daily with breakfast.    . Insulin Glargine (BASAGLAR KWIKPEN) 100 UNIT/ML 11 units in the morning 9 mL 3  . insulin lispro (HUMALOG KWIKPEN) 100 UNIT/ML KwikPen Inject 0.05-0.08 mLs (5-8 Units total) into the skin 3 (three) times daily before meals. Inject 5-8 units 3 times a day with meals. 21.6 mL 2  . Insulin Pen Needle (PEN NEEDLES) 31G X 6 MM MISC Use as directed to check blood sugar 4 times a day. 400 each 2  . pantoprazole (PROTONIX) 40 MG tablet TAKE 1 TABLET BY MOUTH TWICE DAILY 60 tablet 5   No current facility-administered medications for this visit.    Allergies as of 10/07/2020  . (No Known Allergies)    Family History  Problem Relation Age of Onset  . Lupus Mother   . CAD Brother   . Hypertension Brother   . Colon cancer Neg Hx   . Esophageal cancer Neg Hx   . Rectal cancer Neg  Hx   . Stomach cancer Neg Hx     Social History   Socioeconomic History  . Marital status: Married    Spouse name: Not on file  . Number of children: Not on file  . Years of education: Not on file  . Highest education level: Not on file  Occupational History  . Not on file  Tobacco Use  . Smoking status: Former Smoker    Packs/day: 1.00    Years: 57.00    Pack years: 57.00    Types: Cigarettes    Start date: 09/12/1958    Quit date: 04/12/2016    Years since quitting: 4.4  . Smokeless tobacco: Never Used  Vaping Use  . Vaping Use: Never used  Substance and Sexual Activity  . Alcohol use: No  . Drug use: No  . Sexual activity: Yes  Other Topics Concern  . Not on file  Social History Narrative  . Not on file   Social Determinants of Health   Financial Resource Strain: Not on file  Food Insecurity: No Food Insecurity  . Worried About Charity fundraiser in the Last Year: Never true  . Ran Out of Food in the Last Year: Never true  Transportation Needs: No  Transportation Needs  . Lack of Transportation (Medical): No  . Lack of Transportation (Non-Medical): No  Physical Activity: Not on file  Stress: Not on file  Social Connections: Not on file  Intimate Partner Violence: Not on file     Physical Exam: Ht 5' 9"  (1.753 m)   Wt 127 lb 12.8 oz (58 kg)   BMI 18.87 kg/m  Constitutional: generally well-appearing Psychiatric: alert and oriented x3 Abdomen: soft, nontender, nondistended, no obvious ascites, no peritoneal signs, normal bowel sounds No peripheral edema noted in lower extremities  Assessment and plan: 75 y.o. male with history of pancreatic adenocarcinoma, anastomotic ulcer, now with intermittent abdominal pains  I do have some concern about recurrence of pancreatic cancer.  Certainly the anastomotic ulcers could present similarly if they were to recur as well.  Fortunately he is having no overt bleeding.  He has not had contrast-enhanced abdominal imaging in almost 3 years.  He did have a CT stone study about 1 year ago and his CA 19-9 last month was thirteen (somewhat up from one or 2 years ago).  Going to start his work-up blood tests and imaging study.  CT scan abdomen pelvis with IV and oral contrast as well as CBC, complete metabolic profile and coags.  He understands that depending on the results he might need further testing, possible repeat upper endoscopy.  Please see the "Patient Instructions" section for addition details about the plan.  Owens Loffler, MD [facility] 10/07/2020, 10:59 AM   Total time on date of encounter was 30 minutes (this included time spent preparing to see the patient reviewing records; obtaining and/or reviewing separately obtained history; performing a medically appropriate exam and/or evaluation; counseling and educating the patient and family if present; ordering medications, tests or procedures if applicable; and documenting clinical information in the health record).

## 2020-10-15 ENCOUNTER — Ambulatory Visit (HOSPITAL_COMMUNITY)
Admission: RE | Admit: 2020-10-15 | Discharge: 2020-10-15 | Disposition: A | Discharge status: Home / Self Care | Payer: Medicare Other | Source: Ambulatory Visit | Attending: Gastroenterology | Admitting: Gastroenterology

## 2020-10-15 ENCOUNTER — Other Ambulatory Visit: Payer: Self-pay

## 2020-10-15 DIAGNOSIS — Z8507 Personal history of malignant neoplasm of pancreas: Secondary | ICD-10-CM | POA: Insufficient documentation

## 2020-10-15 DIAGNOSIS — R109 Unspecified abdominal pain: Secondary | ICD-10-CM

## 2020-10-15 DIAGNOSIS — I714 Abdominal aortic aneurysm, without rupture: Secondary | ICD-10-CM | POA: Diagnosis not present

## 2020-10-15 MED ORDER — IOHEXOL 300 MG/ML  SOLN
100.0000 mL | Freq: Once | INTRAMUSCULAR | Status: AC | PRN
  Administered 2020-10-15: 80 mL via INTRAVENOUS

## 2020-10-16 ENCOUNTER — Telehealth: Payer: Self-pay

## 2020-10-16 NOTE — Progress Notes (Signed)
Chronic Care Management Pharmacy Assistant   Name: Manuel Olson  MRN: [id number] DOB: 1945/10/16  Reason for Encounter: Disease State   PCP : [name], MD  Allergies:  No Known Allergies  Medications: Outpatient Encounter Medications as of 10/16/2020  Medication Sig  . atorvastatin (LIPITOR) 20 MG tablet TAKE 1 TABLET BY MOUTH ONCE DAILY.  [name] clopidogrel (PLAVIX) 75 MG tablet TAKE 1 TABLET BY MOUTH ONCE DAILY WITH BREAKFAST.  [name] CREON 36000 units CPEP capsule TAKE 1 CAPSULE BY MOUTH BEFORE EACH MEAL AND SNACK. TAKE UP TO 6 CAPSULES PER DAY.  . ferrous sulfate 325 (65 FE) MG tablet Take 325 mg by mouth daily with breakfast.  . Insulin Glargine (BASAGLAR KWIKPEN) 100 UNIT/ML 11 units in the morning  . insulin lispro (HUMALOG KWIKPEN) 100 UNIT/ML KwikPen Inject 0.05-0.08 mLs (5-8 Units total) into the skin 3 (three) times daily before meals. Inject 5-8 units 3 times a day with meals.  . Insulin Pen Needle (PEN NEEDLES) 31G X 6 MM MISC Use as directed to check blood sugar 4 times a day.  . pantoprazole (PROTONIX) 40 MG tablet TAKE 1 TABLET BY MOUTH TWICE DAILY   No facility-administered encounter medications on file as of 10/16/2020.    Current Diagnosis: Patient Active Problem List   Diagnosis Date Noted  . Renal cell carcinoma ([place]) 08/13/2020  . Anastomotic ulcer 01/21/2020  . Long term (current) use of antithrombotics/antiplatelets 01/21/2020  . Upper gastrointestinal bleed 12/31/2019  . Acute GI bleeding 12/30/2019  . Angina pectoris ([place]) 08/22/2019  . Protein-calorie malnutrition, severe 04/05/2018  . Debility 02/27/2018  . Port-A-Cath in place 10/10/2017  . Renal cell carcinoma of right kidney [facility]) s/p nephrectomy 02/2018 10/02/2017  . Ileitis, terminal ([place]) 09/28/2017  . Insulin dependent type 2 diabetes mellitus ([place]) 08/25/2017  . Mixed hyperlipidemia 08/25/2017  . Essential hypertension, benign   . CAD (coronary artery disease)   . Former smoker   .  Severe AS S/P TAVR  08/22/2017  . Cancer of head of pancreas [facility]) s/p Whipple 02/2018 08/10/2017    Reviewed chart prior to disease state call. Spoke with patient regarding BP  Recent Office Vitals: BP Readings from Last 3 Encounters:  10/07/20 134/78  08/25/20 140/82  08/13/20 (!) 169/73   Pulse Readings from Last 3 Encounters:  10/07/20 72  08/25/20 80  08/13/20 87    Wt Readings from Last 3 Encounters:  10/07/20 127 lb 12.8 oz (58 kg)  08/25/20 132 lb 9.6 oz (60.1 kg)  08/13/20 137 lb (62.1 kg)     Kidney Function Lab Results  Component Value Date/Time   CREATININE 1.59 (H) 10/07/2020 11:44 AM   CREATININE 1.67 (H) 01/09/2020 01:48 PM   CREATININE 1.85 (H) 01/01/2020 06:58 PM   CREATININE 1.67 (H) 08/27/2019 09:43 AM   CREATININE 1.2 09/07/2017 08:44 AM   GFR 41.76 (L) 10/07/2020 11:44 AM   GFRNONAA 39 (L) 01/09/2020 01:48 PM   GFRAA 45 (L) 01/09/2020 01:48 PM    BMP Latest Ref Rng & Units 10/07/2020 01/09/2020 01/01/2020  Glucose 70 - 99 mg/dL 104(H) 123(H) 146(H)  BUN 6 - 23 mg/dL 25(H) 28(H) 54(H)  Creatinine 0.40 - 1.50 mg/dL 1.59(H) 1.67(H) 1.85(H)  Sodium 135 - 145 mEq/L 135 137 136  Potassium 3.5 - 5.1 mEq/L 4.1 4.4 4.4  Chloride 96 - 112 mEq/L 100 104 107  CO2 19 - 32 mEq/L 28 27 21(L)  Calcium 8.4 - 10.5 mg/dL 9.6 8.5(L) 7.9(L)      .  Current antihypertensive regimen:                  - atorvastatin (LIPITOR) 20 MG tablet . How often are you checking your Blood Pressure? 3-5x per week . Current home BP readings: Patients wife stated blood pressures are ranging around 145/89 ,  . What recent interventions/DTPs have been made by any provider to improve Blood Pressure control since last CPP Visit: None . Any recent hospitalizations or ED visits since last visit with CPP? No . What diet changes have been made to improve Blood Pressure Control?  o Patients wife stated no changes in diet . What exercise is being done to improve your Blood Pressure Control?   o Patients wife stated patient goes walking  Adherence Review: Is the patient currently on ACE/ARB medication? No Does the patient have >5 day gap between last estimated fill dates? CPP to review  Future Appointments  Date Time Provider [place]  12/01/2020  1:00 PM [facility] CCM PHARMACIST [facility] PEC  12/04/2020 10:40 AM Philemon Kingdom, MD [facility] None  01/15/2021  9:40 AM Satira Sark, MD [facility]  02/23/2021 11:00 AM [facility] LAB [facility] None  02/23/2021 11:45 AM Owens Shark, NP [facility] None  08/03/2021 10:00 AM A[facility]-LAB [facility] None  08/13/2021 10:00 AM McKenzie, Candee Furbish, MD AUR-AUR None    [place] ,[place] Clinical Pharmacist Assistant [phone]  Follow-Up:  Pharmacist Review

## 2020-11-05 ENCOUNTER — Telehealth: Payer: Self-pay | Admitting: Internal Medicine

## 2020-11-05 ENCOUNTER — Other Ambulatory Visit: Payer: Self-pay

## 2020-11-05 DIAGNOSIS — E1159 Type 2 diabetes mellitus with other circulatory complications: Secondary | ICD-10-CM

## 2020-11-05 DIAGNOSIS — E1165 Type 2 diabetes mellitus with hyperglycemia: Secondary | ICD-10-CM

## 2020-11-05 MED ORDER — PANTOPRAZOLE SODIUM 40 MG PO TBEC: 40.0000 mg | DELAYED_RELEASE_TABLET | Freq: Two times a day (BID) | ORAL | 11 refills | Status: DC

## 2020-11-05 NOTE — Telephone Encounter (Signed)
MEDICATION: pen needles for Basaglar & Humalog  PHARMACY:  [facility] in [place] CONTACTED [place]?  yes  IS THIS A 90 DAY SUPPLY : unknown  IS PATIENT OUT OF MEDICATION:   IF NOT; HOW MUCH IS LEFT:   LAST APPOINTMENT DATE: @12 /23/2021  NEXT APPOINTMENT DATE:@3 /25/2022  DO WE HAVE YOUR PERMISSION TO LEAVE A DETAILED MESSAGE?:  OTHER COMMENTS:    **Let patient know to contact pharmacy at the end of the day to make sure medication is ready. **  ** Please notify patient to allow 48-72 hours to process**  **Encourage patient to contact the pharmacy for refills or they can request refills through [facility]**

## 2020-11-06 NOTE — Telephone Encounter (Signed)
Patient called to check status of refill request - only has 4 pen needles remaining

## 2020-11-09 ENCOUNTER — Telehealth: Payer: Self-pay | Admitting: Internal Medicine

## 2020-11-09 ENCOUNTER — Telehealth: Payer: Self-pay

## 2020-11-09 MED ORDER — BASAGLAR KWIKPEN 100 UNIT/ML ~~LOC~~ SOPN: PEN_INJECTOR | SUBCUTANEOUS | 0 refills | Status: DC

## 2020-11-09 MED ORDER — INSULIN LISPRO (1 UNIT DIAL) 100 UNIT/ML (KWIKPEN): 5.0000 [IU] | PEN_INJECTOR | Freq: Three times a day (TID) | SUBCUTANEOUS | 0 refills | Status: DC

## 2020-11-09 NOTE — Chronic Care Management (AMB) (Signed)
    Chronic Care Management Pharmacy Assistant   Name: Manuel Olson  MRN: [id number] DOB: 10-30-1945  Reason for Encounter: Chart Review  PCP : [name], MD  Allergies:  No Known Allergies  Medications: Outpatient Encounter Medications as of 11/09/2020  Medication Sig  . atorvastatin (LIPITOR) 20 MG tablet TAKE 1 TABLET BY MOUTH ONCE DAILY.  [name] clopidogrel (PLAVIX) 75 MG tablet TAKE 1 TABLET BY MOUTH ONCE DAILY WITH BREAKFAST.  [name] CREON 36000 units CPEP capsule TAKE 1 CAPSULE BY MOUTH BEFORE EACH MEAL AND SNACK. TAKE UP TO 6 CAPSULES PER DAY.  . ferrous sulfate 325 (65 FE) MG tablet Take 325 mg by mouth daily with breakfast.  . Insulin Glargine (BASAGLAR KWIKPEN) 100 UNIT/ML 11 units in the morning  . insulin lispro (HUMALOG KWIKPEN) 100 UNIT/ML KwikPen Inject 5-8 Units into the skin 3 (three) times daily before meals. Inject 5-8 units 3 times a day with meals.  . Insulin Pen Needle (PEN NEEDLES) 31G X 6 MM MISC Use as directed to check blood sugar 4 times a day.  . pantoprazole (PROTONIX) 40 MG tablet Take 1 tablet (40 mg total) by mouth 2 (two) times daily.   No facility-administered encounter medications on file as of 11/09/2020.    Current Diagnosis: Patient Active Problem List   Diagnosis Date Noted  . Renal cell carcinoma ([place]) 08/13/2020  . Anastomotic ulcer 01/21/2020  . Long term (current) use of antithrombotics/antiplatelets 01/21/2020  . Upper gastrointestinal bleed 12/31/2019  . Acute GI bleeding 12/30/2019  . Angina pectoris ([place]) 08/22/2019  . Protein-calorie malnutrition, severe 04/05/2018  . Debility 02/27/2018  . Port-A-Cath in place 10/10/2017  . Renal cell carcinoma of right kidney [facility]) s/p nephrectomy 02/2018 10/02/2017  . Ileitis, terminal ([place]) 09/28/2017  . Insulin dependent type 2 diabetes mellitus ([place]) 08/25/2017  . Mixed hyperlipidemia 08/25/2017  . Essential hypertension, benign   . CAD (coronary artery disease)   . Former smoker   .  Severe AS S/P TAVR  08/22/2017  . Cancer of head of pancreas [facility]) s/p Whipple 02/2018 08/10/2017    Reviewed chart for medication changes.  No OVs, Consults, or hospital visits since last care coordination call/Pharmacist visit.  No medication changes indicated.  Future Appointments  Date Time Provider [place]  12/01/2020  1:00 PM [facility] CCM PHARMACIST [facility] PEC  12/04/2020 10:40 AM Philemon Kingdom, MD [facility] None  01/15/2021  9:40 AM Satira Sark, MD [facility]  02/23/2021 11:00 AM [facility] LAB [facility] None  02/23/2021 11:45 AM Owens Shark, NP [facility]MED[facility] None  08/03/2021 10:00 AM AUR-LAB AUR-AUR None  08/13/2021 10:00 AM McKenzie, Candee Furbish, MD AUR-AUR None    April D Calhoun, [place] Pharmacist Assistant [phone]   Follow-Up:  Pharmacist Review

## 2020-11-09 NOTE — Telephone Encounter (Signed)
Pt's wife called because [employer] [facility] is needing [place] to send over pt's most recent office notes to them. They sent a request for these on 1/28 and 2/12 with no response. Please fax to [phone].

## 2020-11-09 NOTE — Telephone Encounter (Signed)
Last OV notes routed via Epic to [phone].

## 2020-11-09 NOTE — Telephone Encounter (Signed)
Rx sent to preferred pharmacy.

## 2020-11-10 ENCOUNTER — Other Ambulatory Visit: Payer: Self-pay

## 2020-11-10 DIAGNOSIS — E1159 Type 2 diabetes mellitus with other circulatory complications: Secondary | ICD-10-CM

## 2020-11-10 DIAGNOSIS — E1165 Type 2 diabetes mellitus with hyperglycemia: Secondary | ICD-10-CM

## 2020-11-10 MED ORDER — PEN NEEDLES 31G X 6 MM MISC: 2 refills | Status: AC

## 2020-11-10 NOTE — Telephone Encounter (Signed)
Inbound fax from pharmacy requesting refill for [employer] Pen Needles

## 2020-11-16 ENCOUNTER — Telehealth: Payer: Self-pay | Admitting: *Deleted

## 2020-11-16 NOTE — Telephone Encounter (Signed)
Thank you for checking.  He is okay to stay on his Plavix.  He does not have dysphagia, very unlikely he will need anything other than some forcep biopsies which is safe to do while still on Plavix.  Thanks

## 2020-11-16 NOTE — Telephone Encounter (Signed)
Dr.Jacobs,  Patient is for an EGD at [facility] with you. Last OV 10/07/2020. Patient is on Plavix, does he need to hold this for the EGD, if so we need clearance? or stay on the Plavix? Please advise.  Thank you,Damani Rando PV

## 2020-11-16 NOTE — Telephone Encounter (Signed)
Noted  

## 2020-11-25 DIAGNOSIS — H524 Presbyopia: Secondary | ICD-10-CM | POA: Diagnosis not present

## 2020-11-25 DIAGNOSIS — H2513 Age-related nuclear cataract, bilateral: Secondary | ICD-10-CM | POA: Diagnosis not present

## 2020-11-25 DIAGNOSIS — E119 Type 2 diabetes mellitus without complications: Secondary | ICD-10-CM | POA: Diagnosis not present

## 2020-11-25 DIAGNOSIS — H349 Unspecified retinal vascular occlusion: Secondary | ICD-10-CM | POA: Diagnosis not present

## 2020-11-25 LAB — HM DIABETES EYE EXAM

## 2020-11-27 ENCOUNTER — Telehealth: Payer: Self-pay | Admitting: Internal Medicine

## 2020-11-27 ENCOUNTER — Encounter: Payer: Self-pay | Admitting: Family Medicine

## 2020-11-27 NOTE — Telephone Encounter (Signed)
[facility] called regarding a request they sent on 3/2 for pt's updated clinical notes to be sent to them so pt can receive his sensors. They ask that we please send those clinical notes to fax # [phone].

## 2020-11-27 NOTE — Telephone Encounter (Signed)
Pt's last OV notes routed to [place] at [phone] via [place].

## 2020-11-30 ENCOUNTER — Ambulatory Visit (AMBULATORY_SURGERY_CENTER): Payer: Self-pay

## 2020-11-30 ENCOUNTER — Other Ambulatory Visit: Payer: Self-pay

## 2020-11-30 VITALS — Ht 69.0 in | Wt 128.2 lb

## 2020-11-30 DIAGNOSIS — R109 Unspecified abdominal pain: Secondary | ICD-10-CM

## 2020-11-30 NOTE — Progress Notes (Signed)
No egg or soy allergy known to patient  No issues with past sedation with any surgeries or procedures Patient denies ever being told they had issues or difficulty with intubation  No FH of Malignant Hyperthermia No diet pills per patient No home 02 use per patient  Patient is on Plavix.  Per [name], MD there is no need to hold this medication prior to procedure. Pt denies issues with constipation  No A fib or A flutter  EMMI video to pt or via [place] 19 guidelines implemented in PV today with Pt and RN  Pt is not vaccinated  for Covid   Due to the COVID-19 pandemic we are asking patients to follow certain guidelines.  Pt aware of COVID protocols and [facility] guidelines

## 2020-12-01 ENCOUNTER — Ambulatory Visit (INDEPENDENT_AMBULATORY_CARE_PROVIDER_SITE_OTHER): Payer: Medicare Other

## 2020-12-01 DIAGNOSIS — I10 Essential (primary) hypertension: Secondary | ICD-10-CM

## 2020-12-01 DIAGNOSIS — E782 Mixed hyperlipidemia: Secondary | ICD-10-CM

## 2020-12-01 NOTE — Progress Notes (Signed)
Chronic Care Management Pharmacy Note  12/01/2020 Name:  Manuel Olson MRN:  [id number] DOB:  [birth year]  Subjective: Manuel Olson is an 75 y.o. year old male who is a primary patient of [name], Algis Greenhouse, MD.  The CCM team was consulted for assistance with disease management and care coordination needs.  Accompanied by wife, Manuel Olson.   Engaged with patient by telephone for follow up visit in response to provider referral for pharmacy case management and/or care coordination services.   Consent to Services:  The patient was given information about Chronic Care Management services, agreed to services, and gave verbal consent prior to initiation of services.  Please see initial visit note for detailed documentation.   Patient Care Team: [name], MD as PCP - General (Family Medicine) Satira Sark, MD as PCP - Cardiology (Cardiology) Madelin Rear, [facility] as Pharmacist (Pharmacist)  Objective: Lab Results  Component Value Date   CREATININE 1.59 (H) 10/07/2020   CREATININE 1.67 (H) 01/09/2020   CREATININE 1.85 (H) 01/01/2020   GFR 41.76 (L) 10/07/2020   GFR 45.15 (L) 10/09/2019   GFRNONAA 39 (L) 01/09/2020   GFRNONAA 35 (L) 01/01/2020  Last diabetic Eye exam:  Lab Results  Component Value Date/Time   HMDIABEYEEXA No Retinopathy 11/25/2020 12:00 AM    Last diabetic Foot exam: No results found for: HMDIABFOOTEX  Lab Results  Component Value Date   CHOL 92 10/09/2019   CHOL 88 10/12/2018   TRIG 71.0 10/09/2019   TRIG 55.0 10/12/2018   HDL 49.70 10/09/2019   HDL 43.20 10/12/2018   CHOLHDL 2 10/09/2019   CHOLHDL 2 10/12/2018   VLDL 14.2 10/09/2019   VLDL 11.0 10/12/2018   LDLCALC 28 10/09/2019   LDLCALC 34 10/12/2018   Hepatic Function Latest Ref Rng & Units 10/07/2020 01/09/2020 01/01/2020  Total Protein 6.0 - 8.3 g/dL 7.1 5.6(L) 4.7(L)  Albumin 3.5 - 5.2 g/dL 4.2 2.8(L) 2.8(L)  AST 0 - 37 U/L 16 16 23   ALT 0 - 53 U/L 11 14 19   Alk Phosphatase 39 - 117 U/L  99 100 58  Total Bilirubin 0.2 - 1.2 mg/dL 0.5 0.2(L) 1.4(H)  Bilirubin, Direct 0.1 - 0.5 mg/dL - - -   Lab Results  Component Value Date/Time   TSH 1.77 10/09/2019 10:19 AM   TSH 1.267 04/04/2018 09:50 AM   CBC Latest Ref Rng & Units 10/07/2020 08/25/2020 05/26/2020  WBC 4.0 - 10.5 K/uL 6.2 6.1 6.5  Hemoglobin 13.0 - 17.0 g/dL 12.9(L) 12.6(L) 11.9(L)  Hematocrit 39.0 - 52.0 % 39.6 38.8(L) 37.9(L)  Platelets 150.0 - 400.0 K/uL 137.0(L) 127(L) 136(L)   No results found for: VD25OH  Clinical ASCVD: Yes  The ASCVD Risk score Mikey Bussing DC Jr., et al., 2013) failed to calculate for the following reasons:   The patient has a prior MI or stroke diagnosis    Other:  -ECHO (07/2020): LVEF relatively stable at 45 to 50% range. Left ventricular diastolic parameters are consistent with Grade I diastolic dysfunction (impaired relaxation). Elevated left atrial pressure.   Social History   Tobacco Use  Smoking Status Former Smoker  . Packs/day: 1.00  . Years: 57.00  . Pack years: 57.00  . Types: Cigarettes  . Start date: 09/12/1958  . Quit date: 04/12/2016  . Years since quitting: 4.6  Smokeless Tobacco Never Used   BP Readings from Last 3 Encounters:  10/07/20 134/78  08/25/20 140/82  08/13/20 (!) 169/73   Pulse Readings from Last 3 Encounters:  10/07/20  72  08/25/20 80  08/13/20 87   Wt Readings from Last 3 Encounters:  11/30/20 128 lb 3.2 oz (58.2 kg)  10/07/20 127 lb 12.8 oz (58 kg)  08/25/20 132 lb 9.6 oz (60.1 kg)    Assessment: Review of patient past medical history, allergies, medications, health status, including review of consultants reports, laboratory and other test data, was performed as part of comprehensive evaluation and provision of chronic care management services.   SDOH:  (Social Determinants of Health) assessments and interventions performed:   CCM Care Plan No Known Allergies Medications Reviewed Today    Reviewed by Madelin Rear, [facility] (Pharmacist) on 12/01/20  at 1344  Med List Status: <None>  Medication Order Taking? Sig Documenting Provider Last Dose Status Informant  atorvastatin (LIPITOR) 20 MG tablet [id number] No TAKE 1 TABLET BY MOUTH ONCE DAILY. Satira Sark, MD Taking Active   clopidogrel (PLAVIX) 75 MG tablet [id number] No TAKE 1 TABLET BY MOUTH ONCE DAILY WITH BREAKFAST. Satira Sark, MD Taking Active   CREON 36000 units CPEP capsule [id number] No TAKE 1 CAPSULE BY MOUTH BEFORE EACH MEAL AND SNACK. TAKE UP TO 6 CAPSULES PER DAY. Ladell Pier, MD Taking Active Spouse/Significant Other  ferrous sulfate 325 (65 FE) MG tablet [id number] No Take 325 mg by mouth daily with breakfast. [provider] Taking Active   Insulin Glargine (BASAGLAR KWIKPEN) 100 UNIT/ML [id number] No 11 units in the morning Philemon Kingdom, MD Taking Active   insulin lispro (HUMALOG KWIKPEN) 100 UNIT/ML KwikPen [id number] No Inject 5-8 Units into the skin 3 (three) times daily before meals. Inject 5-8 units 3 times a day with meals. Philemon Kingdom, MD Taking Active   Insulin Pen Needle (PEN NEEDLES) 31G X 6 MM MISC [id number] No Use as directed to check blood sugar 4 times a day. Philemon Kingdom, MD Taking Active   pantoprazole (PROTONIX) 40 MG tablet [id number] No Take 1 tablet (40 mg total) by mouth 2 (two) times daily. [name], MD Taking Active          Patient Active Problem List   Diagnosis Date Noted  . Renal cell carcinoma ([place]) 08/13/2020  . Anastomotic ulcer 01/21/2020  . Long term (current) use of antithrombotics/antiplatelets 01/21/2020  . Upper gastrointestinal bleed 12/31/2019  . Acute GI bleeding 12/30/2019  . Angina pectoris ([place]) 08/22/2019  . Protein-calorie malnutrition, severe 04/05/2018  . Debility 02/27/2018  . Port-A-Cath in place 10/10/2017  . Renal cell carcinoma of right kidney [facility]) s/p nephrectomy 02/2018 10/02/2017  . Ileitis, terminal ([place]) 09/28/2017  . Insulin dependent type 2 diabetes mellitus ([place])  08/25/2017  . Mixed hyperlipidemia 08/25/2017  . Essential hypertension, benign   . CAD (coronary artery disease)   . Former smoker   . Severe AS S/P TAVR  08/22/2017  . Cancer of head of pancreas [facility]) s/p Whipple 02/2018 08/10/2017    There is no immunization history on file for this patient.  Conditions to be addressed/monitored: CAD, HTN, HLD, DMII and CKD III  Care Plan : General Pharmacy (Adult)  Updates made by Madelin Rear, [facility] since 12/01/2020 12:00 AM    Problem: CAD, HTN, HLD, DMII and CKD III   Priority: High    Long-Range Goal: Patient-Specific Goal   Start Date: 12/01/2020  Expected End Date: 12/01/2021  This Visit's Progress: On track  Priority: High  Note:   Hypertension (BP goal <130/80) -Controlled -Current treatment: . Controlled without medications -Medications previously tried: lisinopril 10 mg, losartan 25 mg   -  Current home readings: 100-110s/80s -Denies hypotensive/hypertensive symptoms -Educated on BP goals and benefits of medications for prevention of heart attack, stroke and kidney damage; Exercise goal of 150 minutes per week; -Counseled to monitor BP at home, document, and provide log at future appointments -Counseled on diet and exercise extensively  Hyperlipidemia: (LDL goal < 70) -Controlled per 09/2019 results, due for f/u labs with PCP -CKD, DMII, CAD (11/2016 STEMI s/p DES to mid LAD, DES to diagonal, DES x 2 to proximal and mid RCA); former smoker -Current treatment: . Atorvastatin 20 mg once daily  -Medications previously tried: Atorvastatin 40 mg once daily   -Educated on Cholesterol goals;  -Recommended to continue current medication  Diabetes (A1c goal 7.5%) -Controlled  -GFR 40s -Cancer of head of pancreas ([place]) 2018 s/p Whipple 02/2018; Renal cell carcinoma of right kidney [facility]) s/p nephrectomy 02/2018 -Current medications: . Basal: Basaglar 100 unit/mL 11 units injected into the skin once each morning . Bolus:Humalog 5-8 units  three times daily with meals -Current home glucose readings . fasting glucose: 107, 121, 167, 138 . post prandial glucose: did not obtain  -Denies frequent hypoglycemic/hyperglycemic symptoms. Of note, 11/30/2020: BG of 55 reported yesterday after injecting 5 units mealtime insulin, wife states this was injected roughly 30 minutes before lunch and is not a routine occurrence. -Current meal patterns:  . breakfast: egg salad  . lunch: soup or sandwich  . dinner: chili . snacks: minimal -Current exercise: no routine exercise, will walk on occasion with wife. -Educated on A1c and blood sugar goals; Proper insulin injection technique; Prevention and management of hypoglycemic episodes; discussed libre 2 cgm as a potentially useful option given realtime alarms for lows/highs - they will talk with endocrinology  -Counseled on diet and exercise extensively Recommended to continue current medication Assessed patient finances. states cost of insulin are not an issue at this time  Health Maintenance Consider DEXA- BMI < 20, age >22, long term PPI, limited exercise  Patient Goals/Self-Care Activities . Over the next 365 days, patient will:  - take medications as prescribed check glucose multiples times per day via CGM, document, and provide at future appointments  Medication Assistance: None required.  Patient affirms current coverage meets needs.  Patient's preferred pharmacy is:  [place], [place] - [address] [place] [address] Phone: [phone] Fax: [phone]     Current Barriers:  . Occasional low blood glucose readings  Pharmacist Clinical Goal(s):  [name] Over the next 365 days, patient will verbalize ability to afford treatment regimen . contact provider office for questions/concerns as evidenced notation of same in electronic health record through collaboration with PharmD and provider.   Interventions: . 1:1  collaboration with [name], MD regarding development and update of comprehensive plan of care as evidenced by provider attestation and co-signature . Inter-disciplinary care team collaboration (see longitudinal plan of care) . Comprehensive medication review performed; medication list updated in electronic medical record . No med changes/recommendations at this time  Consider DEXA- BMI < 20, age >19, long term PPI, limited exercise  Follow Up:  Patient agrees to Care Plan and Follow-up. Plan: [place] f/u telephone call 4 months. DM call 1 month. Future Appointments  Date Time Provider [place]  12/04/2020 10:40 AM Philemon Kingdom, MD [facility] None  12/16/2020 10:00 AM [name], MD [facility]C [facility]  01/15/2021  9:40 AM Satira Sark, MD [facility]  02/23/2021 11:00 AM [facility] LAB [facility] None  02/23/2021  11:45 AM Owens Shark, NP CH[facility]-DWB None  04/06/2021  3:30 PM [facility] CCM PHARMACIST [facility] PEC  08/03/2021 10:00 AM AUR-LAB AUR-AUR None  08/13/2021 10:00 AM McKenzie, Candee Furbish, MD AUR-AUR None   Madelin Rear, Pharm.D., BCGP Clinical Pharmacist [place] [phone]

## 2020-12-01 NOTE — Patient Instructions (Signed)
Manuel Olson,  Thank you for taking the time to review your medications with me today.  I have included our care plan/goals in the following pages. Please review and call me at [phone] with any questions!  Thanks! Ellin Mayhew, Pharm.D., BCGP Clinical Pharmacist [facility] [place] [phone] Patient Care Plan: General Pharmacy (Adult)    Problem Identified: CAD, HTN, HLD, DMII and CKD III   Priority: High    Long-Range Goal: Patient-Specific Goal   Start Date: 12/01/2020  Expected End Date: 12/01/2021  This Visit's Progress: On track  Priority: High  Note:   Hypertension (BP goal <130/80) -Controlled -Current treatment: . Controlled without medications -Medications previously tried: lisinopril 10 mg, losartan 25 mg   -Current home readings: 100-110s/80s -Denies hypotensive/hypertensive symptoms -Educated on BP goals and benefits of medications for prevention of heart attack, stroke and kidney damage; Exercise goal of 150 minutes per week; -Counseled to monitor BP at home, document, and provide log at future appointments -Counseled on diet and exercise extensively  Hyperlipidemia: (LDL goal < 70) -Controlled per 09/2019 results, due for f/u labs with PCP -CKD, DMII, CAD (11/2016 STEMI s/p DES to mid LAD, DES to diagonal, DES x 2 to proximal and mid RCA); former smoker -Current treatment: . Atorvastatin 20 mg once daily  -Medications previously tried: Atorvastatin 40 mg once daily   -Educated on Cholesterol goals;  -Recommended to continue current medication  Diabetes (A1c goal 7.5%) -Controlled  -GFR 40s -Cancer of head of pancreas ([place]) 2018 s/p Whipple 02/2018; Renal cell carcinoma of right kidney [facility]) s/p nephrectomy 02/2018 -Current medications: . Basal: Basaglar 100 unit/mL 11 units injected into the skin once each morning . Bolus:Humalog 5-8 units three times daily with meals -Current home glucose  readings . fasting glucose: 107, 121, 167, 138 . post prandial glucose: did not obtain  -Denies frequent hypoglycemic/hyperglycemic symptoms. Of note, 11/30/2020: BG of 55 reported yesterday after injecting 5 units mealtime insulin, wife states this was injected roughly 30 minutes before lunch and is not a routine occurrence. -Current meal patterns:  . breakfast: egg salad  . lunch: soup or sandwich  . dinner: chili . snacks: minimal -Current exercise: no routine exercise, will walk on occasion with wife. -Educated on A1c and blood sugar goals; Proper insulin injection technique; Prevention and management of hypoglycemic episodes; discussed libre 2 cgm as a potentially useful option given realtime alarms for lows/highs - they will talk with endocrinology  -Counseled on diet and exercise extensively Recommended to continue current medication Assessed patient finances. states cost of insulin are not an issue at this time  Health Maintenance Consider DEXA- BMI < 20, age >31, long term PPI, limited exercise  Patient Goals/Self-Care Activities . Over the next 365 days, patient will:  - take medications as prescribed check glucose multiples times per day via CGM, document, and provide at future appointments  Medication Assistance: None required.  Patient affirms current coverage meets needs.  Patient's preferred pharmacy is:  [place], [place] - [address] [place] [address] Phone: [phone] Fax: [phone]       The patient verbalized understanding of instructions provided today and agreed to receive a mychart copy of patient instruction and/or educational materials. Telephone follow up appointment with pharmacy team member scheduled for: See next appointment with "Care Management Staff" under "What's Next" below.

## 2020-12-01 NOTE — Progress Notes (Signed)
I have personally reviewed this encounter including the documentation in this note and have collaborated with the care management provider regarding care management and care coordination activities to include development and update of the comprehensive care plan. I am certifying that I agree with the content of this note and encounter as supervising physician.

## 2020-12-04 ENCOUNTER — Ambulatory Visit (INDEPENDENT_AMBULATORY_CARE_PROVIDER_SITE_OTHER): Payer: Medicare Other | Admitting: Internal Medicine

## 2020-12-04 ENCOUNTER — Encounter: Payer: Self-pay | Admitting: Internal Medicine

## 2020-12-04 ENCOUNTER — Other Ambulatory Visit: Payer: Self-pay

## 2020-12-04 VITALS — BP 138/82 | HR 86 | Ht 69.0 in | Wt 126.1 lb

## 2020-12-04 DIAGNOSIS — E1159 Type 2 diabetes mellitus with other circulatory complications: Secondary | ICD-10-CM | POA: Diagnosis not present

## 2020-12-04 DIAGNOSIS — E46 Unspecified protein-calorie malnutrition: Secondary | ICD-10-CM | POA: Diagnosis not present

## 2020-12-04 DIAGNOSIS — E1165 Type 2 diabetes mellitus with hyperglycemia: Secondary | ICD-10-CM

## 2020-12-04 DIAGNOSIS — E782 Mixed hyperlipidemia: Secondary | ICD-10-CM | POA: Diagnosis not present

## 2020-12-04 LAB — POCT GLYCOSYLATED HEMOGLOBIN (HGB A1C): Hemoglobin A1C: 6.6 % — AB (ref 4.0–5.6)

## 2020-12-04 MED ORDER — LANTUS SOLOSTAR 100 UNIT/ML ~~LOC~~ SOPN: 11.0000 [IU] | PEN_INJECTOR | Freq: Every day | SUBCUTANEOUS | 3 refills | Status: DC

## 2020-12-04 MED ORDER — INSULIN LISPRO (1 UNIT DIAL) 100 UNIT/ML (KWIKPEN): 5.0000 [IU] | PEN_INJECTOR | Freq: Three times a day (TID) | SUBCUTANEOUS | 3 refills | Status: AC

## 2020-12-04 NOTE — Addendum Note (Signed)
Addended by: Lauralyn Primes on: 12/04/2020 11:17 AM   Modules accepted: Orders

## 2020-12-04 NOTE — Patient Instructions (Addendum)
Please continue: - Basaglar/Lantus 11 units in a.m. - Humalog 3-6 units before the 3 meals, even if the sugars before the meal are normal  Always inject Humalog 15 min before meals.  Please return in 4 months with your sugar log.

## 2020-12-04 NOTE — Progress Notes (Signed)
Patient ID: Manuel Olson, male   DOB: 1946/02/03, 75 y.o.   MRN: [id number]   This visit occurred during the SARS-CoV-2 public health emergency.  Safety protocols were in place, including screening questions prior to the visit, additional usage of staff PPE, and extensive cleaning of exam room while observing appropriate contact time as indicated for disinfecting solutions.    HPI: Manuel Olson is a 75 y.o.-year-old male, initially referred by his oncologist, Dr. Benay Spice, presenting for follow-up for DM2, dx in 01/2016, insulin-dependent, uncontrolled, with complications (CAD - s/p AMI 11/2016 - s/p 4x DES; Ao stenosis). He saw Dr. Dorris Fetch before.  Last visit 6.5 months ago.  Interim history: Of note, patient has a significant history of pancreatic cancer diagnosed 07/2017.  He started chemotherapy 09/2017.  Since then, he also had a kidney removed for renal cell carcinoma and at that time he had pancreatic resection (head of the pancreas in 02/2018.  He was found to have metastases in his lymph nodes.  He restarted chemotherapy after the surgery but finished 06/2018.  He had another CT of the abdomen since last visit (10/15/2017) which showed no new masses. He has abdominal discomfort >> will have an EGD by Dr. [name]. Otherwise, he is feeling well, without significant pain, increased urination or blurry vision.  Reviewed HbA1c levels: Lab Results  Component Value Date   HGBA1C 7.2 (A) 05/21/2020   HGBA1C 6.5 (A) 09/17/2019   HGBA1C 7.3 (A) 05/14/2019   Pt was on a regimen of: - Glipizide 5 mg daily  - Tresiba 20 units at bedtime (samples, cannot afford this)    He is currently on:  - Basaglar 11 units in a.m. - Humalog 3-6 units before the 3 meals, even if the sugars before the meal are normal  He checks his sugars more than 4 times a day with his CGM:   Previously:   Lowest sugar was  48 >> 49 >> 52; it is unclear at which level he has hypoglycemia awareness. Highest  sugar was 300s >> 200s >> 277.  Glucometer: True Metrix  Pt's meals are: - Breakfast: bacon + eggs + English muffin + occas. Tomato juice   - Lunch: grilled ham and cheese sandwich + potato chips - Dinner: chicken + veggies + starch - Snacks: fruit cup, pudding, fig newtons, icecreams  + CKD, last BUN/creatinine:  Lab Results  Component Value Date   BUN 25 (H) 10/07/2020   BUN 28 (H) 01/09/2020   CREATININE 1.59 (H) 10/07/2020   CREATININE 1.67 (H) 01/09/2020  On Cozaar. + HL; last set of lipids: Lab Results  Component Value Date   CHOL 92 10/09/2019   HDL 49.70 10/09/2019   LDLCALC 28 10/09/2019   TRIG 71.0 10/09/2019   CHOLHDL 2 10/09/2019  On Lipitor 20. - last eye exam was on 11/25/2020: No DR. 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  He has had laser surgeries in the past. - no numbness but has tingling in his feet  Pt has FH of DM in brother.  He also has a history of HTN.  ROS: Constitutional: no weight gain/+ weight loss, no fatigue, no subjective hyperthermia, no subjective hypothermia Eyes: no blurry vision, no xerophthalmia ENT: no sore throat, no nodules palpated in neck, no dysphagia, no odynophagia, no hoarseness Cardiovascular: no CP/no SOB/no palpitations/no leg swelling Respiratory: no cough/no SOB/no wheezing Gastrointestinal: no N/no V/no D/no C/no acid reflux Musculoskeletal: no muscle aches/no joint aches Skin: no rashes, no hair loss  Neurological: no tremors/no numbness/no tingling/no dizziness  I reviewed pt's medications, allergies, PMH, social hx, family hx, and changes were documented in the history of present illness. Otherwise, unchanged from my initial visit note.  Past Medical History:  Diagnosis Date  . Arthritis    back  . Blood transfusion without reported diagnosis   . CAD (coronary artery disease)    a. 11/2016 in [place]: STEMI s/p DES to mid LAD, DES to diagonal, DES x 2 to proximal and mid RCA b. 08/2019: NSTEMI with DES to distal RCA  .  Essential hypertension   . Full dentures   . GERD (gastroesophageal reflux disease)   . History of stroke   . Hyperlipidemia   . Myocardial infarction ([place])   . Pancreatic cancer ([place])   . Pneumonia   . Renal cell carcinoma ([place])   . S/P TAVR (transcatheter aortic valve replacement)    a. 08/2017:  Edwards Sapien 3 THV (size 26 mm, model #9600CM26A , serial # [id number])  . Severe aortic stenosis    a. 08/2017: s/p TAVR by Dr. Angelena Form and Dr. Cyndia Bent. 2021 NO AVS on echo  . Type 2 diabetes mellitus ([place])   . Wears glasses   . Wears hearing aid    Past Surgical History:  Procedure Laterality Date  . APPENDECTOMY    . CARDIAC CATHETERIZATION    . CORONARY ANGIOGRAPHY N/A 08/23/2019   Procedure: CORONARY ANGIOGRAPHY;  Surgeon: Belva Crome, MD;  Location: [facility] [facility];  Service: Cardiovascular;  Laterality: N/A;  . CORONARY STENT INTERVENTION N/A 08/23/2019   Procedure: CORONARY STENT INTERVENTION;  Surgeon: Belva Crome, MD;  Location: [facility];  Service: Cardiovascular;  Laterality: N/A;  . CORONARY STENT PLACEMENT     x 4 in March 2018  . ESOPHAGOGASTRODUODENOSCOPY (EGD) WITH PROPOFOL N/A 01/02/2020   Procedure: ESOPHAGOGASTRODUODENOSCOPY (EGD) WITH PROPOFOL;  Surgeon: Jackquline Denmark, MD;  Location: [facility];  Service: Endoscopy;  Laterality: N/A;  . EUS N/A 08/02/2017   Procedure: UPPER ENDOSCOPIC ULTRASOUND (EUS) RADIAL;  Surgeon: [name], MD;  Location: [facility];  Service: Endoscopy;  Laterality: N/A;  . EYE SURGERY Left   . HAND SURGERY Bilateral   . HEMOSTASIS CLIP PLACEMENT  01/02/2020   Procedure: HEMOSTASIS CLIP PLACEMENT;  Surgeon: Jackquline Denmark, MD;  Location: [facility];  Service: Endoscopy;;  . HERNIA REPAIR Right   . HOT HEMOSTASIS N/A 01/02/2020   Procedure: HOT HEMOSTASIS (ARGON PLASMA COAGULATION/BICAP);  Surgeon: Jackquline Denmark, MD;  Location: [facility];  Service: Endoscopy;  Laterality: N/A;  . LAPAROSCOPY N/A 02/19/2018    Procedure: DIAGNOSTIC LAPAROSCOPY, ERAS PATHWAY;  Surgeon: [name], MD;  Location: [facility];  Service: General;  Laterality: N/A;  . MULTIPLE TOOTH EXTRACTIONS    . NEPHRECTOMY Left 02/19/2018   Procedure: OPEN LEFT RADICAL NEPHRECTOMY;  Surgeon: Cleon Gustin, MD;  Location: [facility];  Service: Urology;  Laterality: Left;  . PORT-A-CATH REMOVAL N/A 06/13/2019   Procedure: PORT REMOVAL;  Surgeon: [name], MD;  Location: [facility];  Service: General;  Laterality: N/A;  . PORTACATH PLACEMENT N/A 09/15/2017   Procedure: INSERTION PORT-A-CATH;  Surgeon: [name], MD;  Location: [facility];  Service: General;  Laterality: N/A;  . RIGHT/LEFT HEART CATH AND CORONARY ANGIOGRAPHY N/A 07/05/2017   Procedure: RIGHT/LEFT HEART CATH AND CORONARY ANGIOGRAPHY;  Surgeon: [name], MD;  Location: [facility];  Service: Cardiovascular;  Laterality: N/A;  . SCLEROTHERAPY  01/02/2020   Procedure:  SCLEROTHERAPY;  Surgeon: Jackquline Denmark, MD;  Location: [facility];  Service: Endoscopy;;  . SHOULDER ARTHROSCOPY WITH ROTATOR CUFF REPAIR Left   . TEE WITHOUT CARDIOVERSION N/A 08/22/2017   Procedure: TRANSESOPHAGEAL ECHOCARDIOGRAM (TEE);  Surgeon: Burnell Blanks, MD;  Location: [facility];  Service: Open Heart Surgery;  Laterality: N/A;  . TRANSCATHETER AORTIC VALVE REPLACEMENT, TRANSFEMORAL N/A 08/22/2017   Procedure: TRANSCATHETER AORTIC VALVE REPLACEMENT, TRANSFEMORAL;  Surgeon: Burnell Blanks, MD;  Location: [facility];  Service: Open Heart Surgery;  Laterality: N/A;  . UPPER GASTROINTESTINAL ENDOSCOPY    . WHIPPLE PROCEDURE N/A 02/19/2018   Procedure: WHIPPLE PROCEDURE;  Surgeon: [name], MD;  Location: [facility];  Service: General;  Laterality: N/A;   Social History   Socioeconomic History  . Marital status: Married    Spouse name: Not on file  . Number of children: Not on file  Social Needs  Occupational History  .  Retired  Tobacco Use  . Smoking status:  Former Smoker    Packs/day: 1.00    Years: 57.00    Pack years: 57.00    Types: Cigarettes    Start date: 09/12/1958    Last attempt to quit: 2018    Years since quitting: 1.5  . Smokeless tobacco: Never Used  Substance and Sexual Activity  . Alcohol use: No  . Drug use: No   Current Outpatient Medications on File Prior to Visit  Medication Sig Dispense Refill  . atorvastatin (LIPITOR) 20 MG tablet TAKE 1 TABLET BY MOUTH ONCE DAILY. 90 tablet 3  . clopidogrel (PLAVIX) 75 MG tablet TAKE 1 TABLET BY MOUTH ONCE DAILY WITH BREAKFAST. 90 tablet 3  . CREON 36000 units CPEP capsule TAKE 1 CAPSULE BY MOUTH BEFORE EACH MEAL AND SNACK. TAKE UP TO 6 CAPSULES PER DAY. 90 capsule 0  . ferrous sulfate 325 (65 FE) MG tablet Take 325 mg by mouth daily with breakfast.    . Insulin Glargine (BASAGLAR KWIKPEN) 100 UNIT/ML 11 units in the morning 9 mL 0  . insulin lispro (HUMALOG KWIKPEN) 100 UNIT/ML KwikPen Inject 5-8 Units into the skin 3 (three) times daily before meals. Inject 5-8 units 3 times a day with meals. 21.6 mL 0  . Insulin Pen Needle (PEN NEEDLES) 31G X 6 MM MISC Use as directed to check blood sugar 4 times a day. 400 each 2  . pantoprazole (PROTONIX) 40 MG tablet Take 1 tablet (40 mg total) by mouth 2 (two) times daily. 60 tablet 11   No current facility-administered medications on file prior to visit.   No Known Allergies Family History  Problem Relation Age of Onset  . Lupus Mother   . CAD Brother   . Hypertension Brother   . Colon cancer Neg Hx   . Esophageal cancer Neg Hx   . Rectal cancer Neg Hx   . Stomach cancer Neg Hx     PE: BP 138/82 (BP Location: Right Arm, Patient Position: Sitting, Cuff Size: Normal)   Pulse 86   Ht 5' 9"  (1.753 m)   Wt 126 lb 1.6 oz (57.2 kg)   SpO2 97%   BMI 18.62 kg/m  Wt Readings from Last 3 Encounters:  12/04/20 126 lb 1.6 oz (57.2 kg)  11/30/20 128 lb 3.2 oz (58.2 kg)  10/07/20 127 lb 12.8 oz (58 kg)   Constitutional: thin, in NAD Eyes:  PERRLA, EOMI, no exophthalmos ENT: moist mucous membranes, no thyromegaly, no cervical lymphadenopathy Cardiovascular: RRR, No MRG Respiratory: CTA B Gastrointestinal:  abdomen soft, NT, ND, BS+ Musculoskeletal: no deformities, strength intact in all 4 Skin: moist, warm, no rashes Neurological: no tremor with outstretched hands, DTR normal in all 4  ASSESSMENT: 1. DM2, non-insulin-dependent, uncontrolled, with complications - CAD - s/p AMI 11/2016 - s/p DES x4 -  Ao stenosis  His insulin production after pancreatic resection was low: Component     Latest Ref Rng & Units 10/12/2018  C-Peptide     0.80 - 3.85 ng/mL 0.53 (L)  Glucose, Plasma     65 - 99 mg/dL 168 (H)   2. HL  3.  Protein caloric malnutrition  PLAN:  1. Patient with history of initially controlled diabetes with worsening control after his pancreatic cancer diagnosis in 2018 and subsequent pancreatectomy.  He has low insulin production and is on basal-bolus insulin regimen.  At last visit, sugars were better in the first part of the day, but they were higher after dinner.  Upon questioning, he was taking a lower dose of insulin before dinner and I advised him to increase the dose.  Sugars were also higher at breakfast but not as high as after dinner.  Discussed about reducing the glycemic index of his breakfast and adding healthy fats to the leg exertional breakfast carbs.  Otherwise, we did not change his regimen. CGM interpretation: -At today's visit, we reviewed her CGM downloads: It appears that 75% of values are in target range (goal >70%), while 10% are higher than 180 (goal <25%), and 15% are lower than 70 (goal <4%).  The calculated average blood sugar is 160.  The projected HbA1c for the next 3 months (GMI) is 6.1%. -Reviewing the CGM trends, it appears that his sugars slightly better controlled during the day but they are still increasing after dinner.  Upon questioning, he taking his insulin frequently at the start  of the meal or even an hour after.  We discussed that this is not associated with good control and strongly advised him to try his best to take it 15 minutes before the meal.  He did have some low-dose during the day, usually associated with taking the insulin too late.  Lowest was 52 -we discussed that we absolutely need to avoid lows going forward.  He does have some blood sugars in the 200s, but I would not want to increase his insulin dose for now until he starts taking Humalog 15 minutes before meals, to try to avoid further lows. - I suggested to:  Patient Instructions  Please continue: - Basaglar/Lantus 11 units in a.m. - Humalog 3-6 units before the 3 meals, even if the sugars before the meal are normal  Always inject Humalog 15 min before meals.  Please return in 4 months with your sugar log.   - we checked his HbA1c: 6.6% (improved) - advised to check sugars at different times of the day - 4x a day, rotating check times - advised for yearly eye exams >> he is UTD - return to clinic in 4 months  2. HL -Reviewed latest lipid panel from 09/2019: All fractions at goal: Lab Results  Component Value Date   CHOL 92 10/09/2019   HDL 49.70 10/09/2019   LDLCALC 28 10/09/2019   TRIG 71.0 10/09/2019   CHOLHDL 2 10/09/2019  -He continues Lipitor 20 mg daily without side effects  3.  Protein caloric malnutrition -He continues to try to gain weight, but he has abd. Distension after eating >> was not eating well -Lost 6 pounds since last  visit -We will continue insulin, which has an anabolic effect  Philemon Kingdom, MD PhD [facility]

## 2020-12-08 ENCOUNTER — Telehealth: Payer: Self-pay

## 2020-12-08 NOTE — Telephone Encounter (Signed)
Requested OV notes routed to [phone] via Epic

## 2020-12-16 ENCOUNTER — Ambulatory Visit (AMBULATORY_SURGERY_CENTER): Payer: Medicare Other | Admitting: Gastroenterology

## 2020-12-16 ENCOUNTER — Other Ambulatory Visit: Payer: Self-pay

## 2020-12-16 ENCOUNTER — Encounter: Payer: Self-pay | Admitting: Gastroenterology

## 2020-12-16 VITALS — BP 170/85 | HR 72 | Temp 97.3°F | Resp 14 | Ht 69.0 in | Wt 128.0 lb

## 2020-12-16 DIAGNOSIS — R109 Unspecified abdominal pain: Secondary | ICD-10-CM | POA: Diagnosis not present

## 2020-12-16 DIAGNOSIS — R1013 Epigastric pain: Secondary | ICD-10-CM | POA: Diagnosis not present

## 2020-12-16 DIAGNOSIS — K3189 Other diseases of stomach and duodenum: Secondary | ICD-10-CM | POA: Diagnosis not present

## 2020-12-16 MED ORDER — SODIUM CHLORIDE 0.9 % IV SOLN
500.0000 mL | Freq: Once | INTRAVENOUS | Status: DC
Start: 2020-12-16 — End: 2020-12-16

## 2020-12-16 NOTE — Progress Notes (Addendum)
Called to room to assist during endoscopic procedure.  Patient ID and intended procedure confirmed with present staff. Received instructions for my participation in the procedure from the performing physician.   Per Dr. [name], send pathology regular path. [place]

## 2020-12-16 NOTE — Patient Instructions (Signed)
Await pathology results from biopsies taken today.  Resume Plavix today at your prior dose.  Resume previous diet and medications.  YOU HAD AN ENDOSCOPIC PROCEDURE TODAY AT [facility]:   Refer to the procedure report that was given to you for any specific questions about what was found during the examination.  If the procedure report does not answer your questions, please call your gastroenterologist to clarify.  If you requested that your care partner not be given the details of your procedure findings, then the procedure report has been included in a sealed envelope for you to review at your convenience later.  YOU SHOULD EXPECT: Some feelings of bloating in the abdomen. Passage of more gas than usual.  Walking can help get rid of the air that was put into your GI tract during the procedure and reduce the bloating. If you had a lower endoscopy (such as a colonoscopy or flexible sigmoidoscopy) you may notice spotting of blood in your stool or on the toilet paper. If you underwent a bowel prep for your procedure, you may not have a normal bowel movement for a few days.  Please Note:  You might notice some irritation and congestion in your nose or some drainage.  This is from the oxygen used during your procedure.  There is no need for concern and it should clear up in a day or so.  SYMPTOMS TO REPORT IMMEDIATELY:    Following upper endoscopy (EGD)  Vomiting of blood or coffee ground material  New chest pain or pain under the shoulder blades  Painful or persistently difficult swallowing  New shortness of breath  Fever of 100F or higher  Black, tarry-looking stools  For urgent or emergent issues, a gastroenterologist can be reached at any hour by calling [phone]. Do not use MyChart messaging for urgent concerns.    DIET:  We do recommend a small meal at first, but then you may proceed to your regular diet.  Drink plenty of fluids but you should avoid alcoholic  beverages for 24 hours.  ACTIVITY:  You should plan to take it easy for the rest of today and you should NOT DRIVE or use heavy machinery until tomorrow (because of the sedation medicines used during the test).    FOLLOW UP: Our staff will call the number listed on your records 48-72 hours following your procedure to check on you and address any questions or concerns that you may have regarding the information given to you following your procedure. If we do not reach you, we will leave a message.  We will attempt to reach you two times.  During this call, we will ask if you have developed any symptoms of COVID 19. If you develop any symptoms (ie: fever, flu-like symptoms, shortness of breath, cough etc.) before then, please call [phone].  If you test positive for Covid 19 in the 2 weeks post procedure, please call and report this information to [place].    If any biopsies were taken you will be contacted by phone or by letter within the next 1-3 weeks.  Please call us at [phone] if you have not heard about the biopsies in 3 weeks.    SIGNATURES/CONFIDENTIALITY: You and/or your care partner have signed paperwork which will be entered into your electronic medical record.  These signatures attest to the fact that that the information above on your After Visit Summary has been reviewed and is understood.  Full responsibility of the confidentiality of  this discharge information lies with you and/or your care-partner.

## 2020-12-16 NOTE — Progress Notes (Signed)
To PACU, VSS. Report to [email or web address]

## 2020-12-16 NOTE — Progress Notes (Signed)
VS-CW  Pt's states no medical or surgical changes since previsit or office visit.

## 2020-12-16 NOTE — Op Note (Signed)
[facility] Patient Name: Manuel Olson Procedure Date: 12/16/2020 9:40 AM MRN: [id number] Endoscopist: [name] , MD Age: 75 Referring MD:  Date of Birth: 17-Aug-1946 Gender: Male Account #: [id number] Procedure:                Upper GI endoscopy Indications:              Epigastric abdominal pain 1. Pancreatic                            adenocarcinoma: Diagnosed 2018,Whipple surgery                            02/2018,[id number], 2/18 lymph nodes positive,                            lymphovascular and perineural invasion present,                            mild to moderate treatment effect, tumor involved                            adherent portal vein with negative portal vein                            margins. Adjuvant chemotherapy completed 06/2018.                            Dr. Benay Spice. CT 10/2020 no evdence of recurrence                           2. Upper GI bleeding from [place] anastomotic ulcer EGD                            while hospitalized April 2021, H. pylori biopsies                            negative. Repeat EGD June 2021 showed typical                            Whipple anatomy, adherent endoclips at [place]                            anastomosis without any sign of the previously                            noted anastomotic ulcer. He was recommended to stay                            on proton pump inhibitor twice daily and iron once                            daily. Medicines:                Monitored Anesthesia Care Procedure:  Pre-Anesthesia Assessment:                           - Prior to the procedure, a History and Physical                            was performed, and patient medications and                            allergies were reviewed. The patient's tolerance of                            previous anesthesia was also reviewed. The risks                            and benefits of the procedure and the sedation                             options and risks were discussed with the patient.                            All questions were answered, and informed consent                            was obtained. Prior Anticoagulants: The patient has                            taken Plavix (clopidogrel), last dose was 2 days                            prior to procedure (he was told NOT to stop the                            plavix but did anyway for 2 days). ASA Grade                            Assessment: III - A patient with severe systemic                            disease. After reviewing the risks and benefits,                            the patient was deemed in satisfactory condition to                            undergo the procedure.                           After obtaining informed consent, the endoscope was                            passed under direct vision. Throughout the  procedure, the patient's blood pressure, pulse, and                            oxygen saturations were monitored continuously. The                            Endoscope was introduced through the mouth, and                            advanced to the afferent and efferent jejunal                            loops. The upper GI endoscopy was accomplished                            without difficulty. The patient tolerated the                            procedure well. Scope In: Scope Out: Findings:                 Typical post Whipple anatomy. 3 endoclips remain                            adherent to the GE anastomosis. No sign of ulcer or                            obvious foreign body reaction at the site. The                            mucosa at the anastomosis was fairly normal                            appearing, one region was a bit more edematous that                            the rest and I biopsied this with forceps (not                            overtly neoplastic appearing).                            Afferent and efferent limbs were normal appearing.                           The exam was otherwise without abnormality. Complications:            No immediate complications. Estimated blood loss:                            None. Estimated Blood Loss:     Estimated blood loss: none. Impression:               - Typical post Whipple anatomy. 3 endoclips remain  adherent to the GE anastomosis. No sign of ulcer or                            obvious foreign body reaction at the site. The                            mucosa at the anastomosis was fairly normal                            appearing, one region was a bit more edematous that                            the rest and I biopsied this with forceps (not                            overtly neoplastic appearing).                           - Afferent and efferent limbs were normal appearing.                           - The examination was otherwise normal. Recommendation:           - Await pathology results.                           - Resume your plavix today [name], MD 12/16/2020 10:00:56 AM This report has been signed electronically.

## 2020-12-18 ENCOUNTER — Telehealth: Payer: Self-pay

## 2020-12-18 NOTE — Telephone Encounter (Signed)
  Follow up Call-  Call back number 12/16/2020 03/06/2020  Post procedure Call Back phone  # [phone]  Permission to leave phone message Yes Yes  Some recent data might be hidden     Patient questions:  Do you have a fever, pain , or abdominal swelling? No. Pain Score  0 *  Have you tolerated food without any problems? Yes.    Have you been able to return to your normal activities? Yes.    Do you have any questions about your discharge instructions: Diet   No. Medications  No. Follow up visit  No.  Do you have questions or concerns about your Care? No.  Actions: * If pain score is 4 or above: No action needed, pain <4.  1. Have you developed a fever since your procedure? no  2.   Have you had an respiratory symptoms (SOB or cough) since your procedure? no  3.   Have you tested positive for COVID 19 since your procedure no  4.   Have you had any family members/close contacts diagnosed with the COVID 19 since your procedure?  no   If yes to any of these questions please route to Joylene John, RN and Joella Prince, RN

## 2020-12-24 ENCOUNTER — Telehealth: Payer: Self-pay

## 2020-12-24 NOTE — Chronic Care Management (AMB) (Signed)
    Chronic Care Management Pharmacy Assistant   Name: DREZDEN SEITZINGER  MRN: [id number] DOB: [birth year]  Reason for Encounter: Diabetes Mellitus Disease State Call  Recent office visits:  12/04/20- Benjiman Core, MD- dm follow up, follow up 4 months   Recent consult visits:  12/16/20- Owens Loffler, MD Gertie Fey) - Upper endoscopy  Hospital visits:  None in previous 6 months  Medications: Outpatient Encounter Medications as of 12/24/2020  Medication Sig  . atorvastatin (LIPITOR) 20 MG tablet TAKE 1 TABLET BY MOUTH ONCE DAILY.  [name] clopidogrel (PLAVIX) 75 MG tablet TAKE 1 TABLET BY MOUTH ONCE DAILY WITH BREAKFAST.  [name] CREON 36000 units CPEP capsule TAKE 1 CAPSULE BY MOUTH BEFORE EACH MEAL AND SNACK. TAKE UP TO 6 CAPSULES PER DAY.  . ferrous sulfate 325 (65 FE) MG tablet Take 325 mg by mouth daily with breakfast.  . insulin glargine (LANTUS SOLOSTAR) 100 UNIT/ML Solostar Pen Inject 11 Units into the skin daily.  . insulin lispro (HUMALOG KWIKPEN) 100 UNIT/ML KwikPen Inject 5-8 Units into the skin 3 (three) times daily before meals. Inject 5-8 units 3 times a day with meals.  . Insulin Pen Needle (PEN NEEDLES) 31G X 6 MM MISC Use as directed to check blood sugar 4 times a day.  . pantoprazole (PROTONIX) 40 MG tablet Take 1 tablet (40 mg total) by mouth 2 (two) times daily.   No facility-administered encounter medications on file as of 12/24/2020.   Recent Relevant Labs: Lab Results  Component Value Date/Time   HGBA1C 6.6 (A) 12/04/2020 11:17 AM   HGBA1C 7.2 (A) 05/21/2020 10:46 AM   HGBA1C 6.4 (H) 02/09/2018 11:32 AM   HGBA1C 8.2 (H) 11/28/2017 02:05 PM    Kidney Function Lab Results  Component Value Date/Time   CREATININE 1.59 (H) 10/07/2020 11:44 AM   CREATININE 1.67 (H) 01/09/2020 01:48 PM   CREATININE 1.85 (H) 01/01/2020 06:58 PM   CREATININE 1.67 (H) 08/27/2019 09:43 AM   CREATININE 1.2 09/07/2017 08:44 AM   GFR 41.76 (L) 10/07/2020 11:44 AM   GFRNONAA 39 (L)  01/09/2020 01:48 PM   GFRAA 45 (L) 01/09/2020 01:48 PM   All statements were made by patient's wife  Current antihyperglycemic regimen:  Basaglar 100 unit/mL 11 units injected into the skin once each morning Humalog 5-8 units three times daily with meals  What recent interventions/DTPs have been made to improve glycemic control:  No recent interventions   Have there been any recent hospitalizations or ED visits since last visit with CPP? No  Patient denies hypoglycemic symptoms, including Pale, Sweaty, Shaky, Hungry, Nervous/irritable and Vision changes  Patient denies hyperglycemic symptoms, including blurry vision, excessive thirst, fatigue, polyuria and weakness  How often are you checking your blood sugar? once daily  What are your blood sugars ranging?  o Fasting: 89,105,128 o Before meals: 192,137,140 o After meals: n/a o Bedtime: n/a  During the week, how often does your blood glucose drop below 70? Never  Are you checking your feet daily/regularly?  Patient's wife stated Mr. Amman does check his feet and they look good.  Adherence Review: Is the patient currently on a STATIN medication? Yes  Atorvastatin 95m  Is the patient currently on ACE/ARB medication? No   Does the patient have >5 day gap between last estimated fill dates? No  Star Rating Drugs: Atorvastatin 264m 90 DS last filled 10/23/20  AsWilford SportsPA, CMA

## 2020-12-28 NOTE — Telephone Encounter (Signed)
OV notes routed to [phone] via Epic

## 2020-12-28 NOTE — Telephone Encounter (Signed)
Patient's wife called stating the office visit still was not received by [facility] and asked if we could please resend that. Any questions we can call wife [name] at [phone].

## 2020-12-28 NOTE — Telephone Encounter (Signed)
Wife states the best fax number for them is [phone]

## 2021-01-14 NOTE — Progress Notes (Signed)
Cardiology Office Note  Date: 01/15/2021   ID: Manuel Olson, DOB [birth year], MRN [id number]  PCP:  Manuel Barrack, MD  Cardiologist:  Manuel Lesches, MD Electrophysiologist:  None   Chief Complaint  Patient presents with  . Cardiac follow-up    History of Present Illness: Manuel Olson is a 75 y.o. male last seen in November 2021.  He is here with his wife today for a follow-up visit.  He tells me that he has been very fatigued, sleeping a lot, also having intermittent chest pain suggestive of angina.  He has not used any nitroglycerin.  Today's blood pressure was significantly elevated.  I reviewed his home blood pressure checks which were not nearly as high, but some systolics in the 209O to 709G.  In the past he had been on beta-blocker and ARB, but these were held when blood pressure was on the lower side.  Follow-up echocardiogram in November 2021 revealed LVEF 45 to 50% with mild diastolic dysfunction, normal RV contraction, stable TAVR prosthesis with mean gradient 13.8 mmHg and no paravalvular regurgitation.  I personally reviewed his ECG today which shows sinus rhythm with LVH, PVCs, and repolarization changes.  I reviewed his lab work from January at which point creatinine was 1.59 and hemoglobin 12.9.  Past Medical History:  Diagnosis Date  . Arthritis    back  . Blood transfusion without reported diagnosis   . CAD (coronary artery disease)    a. 11/2016 in [place]: STEMI s/p DES to mid LAD, DES to diagonal, DES x 2 to proximal and mid RCA b. 08/2019: NSTEMI with DES to distal RCA  . Essential hypertension   . Full dentures   . GERD (gastroesophageal reflux disease)   . History of stroke   . Hyperlipidemia   . Myocardial infarction ([place])   . Pancreatic cancer ([place])   . Pneumonia   . Renal cell carcinoma ([place])   . S/P TAVR (transcatheter aortic valve replacement)    a. 08/2017:  Edwards Sapien 3 THV (size 26 mm, model #9600CM26A , serial # [id number])  .  Severe aortic stenosis    a. 08/2017: s/p TAVR by Dr. Angelena Form and Dr. Cyndia Bent. 2021 NO AVS on echo  . Type 2 diabetes mellitus ([place])   . Wears glasses   . Wears hearing aid     Past Surgical History:  Procedure Laterality Date  . APPENDECTOMY    . CARDIAC CATHETERIZATION    . CORONARY ANGIOGRAPHY N/A 08/23/2019   Procedure: CORONARY ANGIOGRAPHY;  Surgeon: Belva Crome, MD;  Location: [facility];  Service: Cardiovascular;  Laterality: N/A;  . CORONARY STENT INTERVENTION N/A 08/23/2019   Procedure: CORONARY STENT INTERVENTION;  Surgeon: Belva Crome, MD;  Location: [facility];  Service: Cardiovascular;  Laterality: N/A;  . CORONARY STENT PLACEMENT     x 4 in March 2018  . ESOPHAGOGASTRODUODENOSCOPY (EGD) WITH PROPOFOL N/A 01/02/2020   Procedure: ESOPHAGOGASTRODUODENOSCOPY (EGD) WITH PROPOFOL;  Surgeon: Jackquline Denmark, MD;  [facility]: [facility];  Service: Endoscopy;  Laterality: N/A;  . EUS N/A 08/02/2017   Procedure: UPPER ENDOSCOPIC ULTRASOUND (EUS) RADIAL;  Surgeon: [name], MD;  Location: [facility];  Service: Endoscopy;  Laterality: N/A;  . EYE SURGERY Left   . HAND SURGERY Bilateral   . HEMOSTASIS CLIP PLACEMENT  01/02/2020   Procedure: HEMOSTASIS CLIP PLACEMENT;  Surgeon: Jackquline Denmark, MD;  [facility]: [facility];  Service: Endoscopy;;  . HERNIA REPAIR Right   .  HOT HEMOSTASIS N/A 01/02/2020   Procedure: HOT HEMOSTASIS (ARGON PLASMA COAGULATION/BICAP);  Surgeon: Jackquline Denmark, MD;  Location: [facility];  Service: Endoscopy;  Laterality: N/A;  . LAPAROSCOPY N/A 02/19/2018   Procedure: DIAGNOSTIC LAPAROSCOPY, ERAS PATHWAY;  Surgeon: [name], MD;  Location: [facility];  Service: General;  Laterality: N/A;  . MULTIPLE TOOTH EXTRACTIONS    . NEPHRECTOMY Left 02/19/2018   Procedure: OPEN LEFT RADICAL NEPHRECTOMY;  Surgeon: Cleon Gustin, MD;  Location: [facility];  Service: Urology;  Laterality: Left;  . PORT-A-CATH REMOVAL N/A 06/13/2019   Procedure:  PORT REMOVAL;  Surgeon: [name], MD;  Location: [facility];  Service: General;  Laterality: N/A;  . PORTACATH PLACEMENT N/A 09/15/2017   Procedure: INSERTION PORT-A-CATH;  Surgeon: [name], MD;  Location: [facility];  Service: General;  Laterality: N/A;  . RIGHT/LEFT HEART CATH AND CORONARY ANGIOGRAPHY N/A 07/05/2017   Procedure: RIGHT/LEFT HEART CATH AND CORONARY ANGIOGRAPHY;  Surgeon: [name], MD;  Location: [facility];  Service: Cardiovascular;  Laterality: N/A;  . SCLEROTHERAPY  01/02/2020   Procedure: SCLEROTHERAPY;  Surgeon: Jackquline Denmark, MD;  [facility];  Service: Endoscopy;;  . SHOULDER ARTHROSCOPY WITH ROTATOR CUFF REPAIR Left   . TEE WITHOUT CARDIOVERSION N/A 08/22/2017   Procedure: TRANSESOPHAGEAL ECHOCARDIOGRAM (TEE);  Surgeon: Burnell Blanks, MD;  Location: [facility];  Service: Open Heart Surgery;  Laterality: N/A;  . TRANSCATHETER AORTIC VALVE REPLACEMENT, TRANSFEMORAL N/A 08/22/2017   Procedure: TRANSCATHETER AORTIC VALVE REPLACEMENT, TRANSFEMORAL;  Surgeon: Burnell Blanks, MD;  Location: [facility];  Service: Open Heart Surgery;  Laterality: N/A;  . UPPER GASTROINTESTINAL ENDOSCOPY    . WHIPPLE PROCEDURE N/A 02/19/2018   Procedure: WHIPPLE PROCEDURE;  Surgeon: [name], MD;  Location: [facility];  Service: General;  Laterality: N/A;    Current Outpatient Medications  Medication Sig Dispense Refill  . atorvastatin (LIPITOR) 20 MG tablet TAKE 1 TABLET BY MOUTH ONCE DAILY. 90 tablet 3  . carvedilol (COREG) 3.125 MG tablet Take 1 tablet (3.125 mg total) by mouth 2 (two) times daily. 180 tablet 1  . clopidogrel (PLAVIX) 75 MG tablet TAKE 1 TABLET BY MOUTH ONCE DAILY WITH BREAKFAST. 90 tablet 3  . CREON 36000 units CPEP capsule TAKE 1 CAPSULE BY MOUTH BEFORE EACH MEAL AND SNACK. TAKE UP TO 6 CAPSULES PER DAY. 90 capsule 0  . ferrous sulfate 325 (65 FE) MG tablet Take 325 mg by mouth daily with breakfast.    . insulin glargine  (LANTUS SOLOSTAR) 100 UNIT/ML Solostar Pen Inject 11 Units into the skin daily. 15 mL 3  . insulin lispro (HUMALOG KWIKPEN) 100 UNIT/ML KwikPen Inject 5-8 Units into the skin 3 (three) times daily before meals. Inject 5-8 units 3 times a day with meals. 15 mL 3  . Insulin Pen Needle (PEN NEEDLES) 31G X 6 MM MISC Use as directed to check blood sugar 4 times a day. 400 each 2  . nitroGLYCERIN (NITROSTAT) 0.4 MG SL tablet Place 1 tablet (0.4 mg total) under the tongue every 5 (five) minutes x 3 doses as needed for chest pain (if no relief after 2nd dose, proceed to the ED for an evaluation or call 911). 25 tablet 3  . pantoprazole (PROTONIX) 40 MG tablet Take 1 tablet (40 mg total) by mouth 2 (two) times daily. 60 tablet 11   No current facility-administered medications for this visit.   Allergies:  Patient has no known allergies.   ROS: No orthopnea or PND.  No syncope.  Physical Exam: VS:  BP (!) 184/100   Pulse 78   Ht 5' 9"  (1.753 m)   Wt 124 lb (56.2 kg)   SpO2 98%   BMI 18.31 kg/m , BMI Body mass index is 18.31 kg/m.  Wt Readings from Last 3 Encounters:  01/15/21 124 lb (56.2 kg)  12/16/20 128 lb (58.1 kg)  12/04/20 126 lb 1.6 oz (57.2 kg)    General: Elderly male, appears comfortable at rest. HEENT: Conjunctiva and lids normal, wearing a mask. Neck: Supple, no elevated JVP or carotid bruits, no thyromegaly. Lungs: Clear to auscultation, nonlabored breathing at rest. Cardiac: Regular rate and rhythm, no S3, 1/6 systolic murmur, no pericardial rub. Extremities: No pitting edema.  ECG:  An ECG dated 12/30/2019 was personally reviewed today and demonstrated:  Sinus rhythm with poor R wave progression.  Recent Labwork: 10/07/2020: ALT 11; AST 16; BUN 25; Creatinine, Ser 1.59; Hemoglobin 12.9; Platelets 137.0; Potassium 4.1; Sodium 135     Component Value Date/Time   CHOL 92 10/09/2019 1019   TRIG 71.0 10/09/2019 1019   HDL 49.70 10/09/2019 1019   CHOLHDL 2 10/09/2019 1019    VLDL 14.2 10/09/2019 1019   LDLCALC 28 10/09/2019 1019    Other Studies Reviewed Today:  Echocardiogram 07/16/2020: 1. Left ventricular ejection fraction, by estimation, is 45 to 50%. The  left ventricle has mildly decreased function. The left ventricle  demonstrates global hypokinesis. Left ventricular diastolic parameters are  consistent with Grade I diastolic  dysfunction (impaired relaxation). Elevated left atrial pressure.  2. Right ventricular systolic function is normal. The right ventricular  size is normal. There is normal pulmonary artery systolic pressure.  3. Left atrial size was mildly dilated.  4. The mitral valve is normal in structure. No evidence of mitral valve  regurgitation. No evidence of mitral stenosis.  5. Edwards Sapien 3 THV size 26 mm is in the AV position with normal  function. . The aortic valve has been repaired/replaced. There is mild  calcification of the aortic valve. There is mild thickening of the aortic  valve. Aortic valve regurgitation is  not visualized. No aortic stenosis is present.  6. The inferior vena cava is normal in size with greater than 50%  respiratory variability, suggesting right atrial pressure of 3 mmHg.   Assessment and Plan:  1.  Fatigue and intermittent angina symptoms.  ECG reviewed, no acute changes.  He has a history of multivessel CAD status post DES to the LAD, DES to the first diagonal, and DES x2 to the RCA in 2018. He is subsequently status post DES to the distal RCA in December 2020.  He has been on Plavix and Lipitor.  Plan to reinitiate Coreg 3.125 mg twice daily, refill nitroglycerin.  Continue to track vital signs at home and symptom control.  We will bring him back to the office for further evaluation.  Check BMET for that visit.  Might be able to add Entresto or possibly Imdur.  2.  CKD stage III, last creatinine 1.59.  He does have risk for contrast-induced nephropathy.  3.  Severe aortic stenosis status post  TAVR in 2018.  Prosthetic function normal by echocardiogram in November 2021, mean gradient 7 mmHg, no paravalvular regurgitation.  4.  History of pancreatic cancer status post [facility] and chemotherapy.  5.  History of left renal cell carcinoma status post radical nephrectomy.  Medication Adjustments/Labs and Tests Ordered: Current medicines are reviewed at length with the patient today.  Concerns regarding medicines are outlined above.   Tests Ordered: Orders Placed This Encounter  Procedures  . Basic metabolic panel  . EKG 12-Lead    Medication Changes: Meds ordered this encounter  Medications  . carvedilol (COREG) 3.125 MG tablet    Sig: Take 1 tablet (3.125 mg total) by mouth 2 (two) times daily.    Dispense:  180 tablet    Refill:  1    01/15/2021 NEW    Disposition:  Follow up 1 month in the [place] office.  Signed, Satira Sark, MD, [facility] 01/15/2021 10:21 AM    [facility] [place], [place], [place] [address] Phone: [phone]; Fax: [phone]

## 2021-01-15 ENCOUNTER — Encounter: Payer: Self-pay | Admitting: Cardiology

## 2021-01-15 ENCOUNTER — Other Ambulatory Visit: Payer: Self-pay | Admitting: *Deleted

## 2021-01-15 ENCOUNTER — Ambulatory Visit (INDEPENDENT_AMBULATORY_CARE_PROVIDER_SITE_OTHER): Payer: Medicare Other | Admitting: Cardiology

## 2021-01-15 VITALS — BP 184/100 | HR 78 | Ht 69.0 in | Wt 124.0 lb

## 2021-01-15 DIAGNOSIS — Z952 Presence of prosthetic heart valve: Secondary | ICD-10-CM | POA: Diagnosis not present

## 2021-01-15 DIAGNOSIS — N1832 Chronic kidney disease, stage 3b: Secondary | ICD-10-CM

## 2021-01-15 DIAGNOSIS — I25119 Atherosclerotic heart disease of native coronary artery with unspecified angina pectoris: Secondary | ICD-10-CM

## 2021-01-15 MED ORDER — CARVEDILOL 3.125 MG PO TABS: 3.1250 mg | ORAL_TABLET | Freq: Two times a day (BID) | ORAL | 1 refills | Status: DC

## 2021-01-15 MED ORDER — NITROGLYCERIN 0.4 MG SL SUBL: 0.4000 mg | SUBLINGUAL_TABLET | SUBLINGUAL | 3 refills | Status: AC | PRN

## 2021-01-15 NOTE — Patient Instructions (Addendum)
Medication Instructions:   Your physician has recommended you make the following change in your medication:   Start carvedilol 3.125 mg by mouth twice daily  Continue other medications the same  Labwork:  Your physician recommends that you return for lab work in: 1 month to check your BMET. This may be done at [facility].   Testing/Procedures:  none  Follow-Up:  Your physician recommends that you schedule a follow-up appointment in: 1 month at the [place] office.   Any Other Special Instructions Will Be Listed Below (If Applicable).  If you need a refill on your cardiac medications before your next appointment, please call your pharmacy.

## 2021-01-25 ENCOUNTER — Telehealth: Payer: Self-pay | Admitting: Internal Medicine

## 2021-01-25 ENCOUNTER — Telehealth: Payer: Self-pay | Admitting: Cardiology

## 2021-01-25 NOTE — Telephone Encounter (Addendum)
Patient's informed and verbalized understanding of plan.

## 2021-01-25 NOTE — Telephone Encounter (Signed)
On 01/22/2021 patient had a blood sugar low enough to become unresponsive, EMS came, and administered bag of Glucose. Patients wife called to ask how to prevent this from happening.  Patient has been having lows between 50-60's last week or so.  Wife advises only difference is that Dr Domenic Polite started patient on Carvedilol again this past Friday.    Call back [phone]

## 2021-01-25 NOTE — Telephone Encounter (Signed)
Please advise. Dr Cruzita Lederer is out of the office

## 2021-01-25 NOTE — Telephone Encounter (Signed)
What time of day was it low?

## 2021-01-25 NOTE — Telephone Encounter (Signed)
Patient's wife amended that Carvedilol was started 01/16/21 not 01/22/21

## 2021-01-25 NOTE — Telephone Encounter (Signed)
Patients wife stopped by to speak with RN or dr.Mcdowell to see if a side effect of this meds would  Cause the patient sugar levels to drop. She has to call EMS on 5/13 because it got too low. Patient is still taking and she states his BP has been good. Please call her [phone]

## 2021-01-25 NOTE — Telephone Encounter (Signed)
Yes carvedilol can cause low blood sugar and also hide the symptoms of low blood sugar.  Recommend wife and patient keep a close eye on his blood sugar and make sure he is eating enough food since he is on two types of insulin as well.

## 2021-01-25 NOTE — Telephone Encounter (Signed)
Patient's wife called back stating she had something to add to her other call - and I asked what time of day was it low? And she said the side affects of the Carvedilol is low blood sugar and she found out this was the cause of the low sugars. But she just found out from his other Dr. Renee Rival wife wanted to ask how should the insulin be regulated now that she knows the Carvedilol is causing the low sugars. Wife ph# [phone]

## 2021-01-26 ENCOUNTER — Telehealth: Payer: Self-pay | Admitting: Cardiology

## 2021-01-26 NOTE — Telephone Encounter (Signed)
New message    Patient wife wants to know what dosage of carvedilol (COREG) 3.125 MG tablet ?

## 2021-01-26 NOTE — Telephone Encounter (Signed)
Tileshia,  If he has lows between meals, he will need to decrease the Lantus: 11 >> 8 units. If he has lows after meals, he will need to decrease the Humalog 3-6 >> 2-3 units before the 3 meals or even hold Humalog, depending on the results. Ty, C

## 2021-01-26 NOTE — Telephone Encounter (Signed)
Wife notified to keep the Coreg dose at 3.125 twice a day.  Informed her that twice a day dosing is typically 12 hours apart.  Stated she is concerned about doing that so close to his bedtime due to his sugars dropping.  Stated that they have been doing around 8 am and around 2 pm so he will have enough space before bedtime.  Informed her to continue to monitor his BP & HR to make sure staying stable and that if this dosing is working for him along with keeping sugars manageable to continue for now.  They can discuss further at next follow up on 02/18/21 with Bernerd Pho in [place] office.  She verbalized understanding & was appreciative of the call back.

## 2021-01-26 NOTE — Telephone Encounter (Signed)
Called and spoke with Mrs Matsuo and advised If he has lows between meals, he will need to decrease the Lantus: 11 >> 8 units. If he has lows after meals, he will need to decrease the Humalog 3-6 >> 2-3 units before the 3 meals or even hold Humalog, depending on the results.

## 2021-01-26 NOTE — Telephone Encounter (Signed)
Please advise 

## 2021-02-16 ENCOUNTER — Encounter: Payer: Self-pay | Admitting: Gastroenterology

## 2021-02-16 ENCOUNTER — Other Ambulatory Visit (INDEPENDENT_AMBULATORY_CARE_PROVIDER_SITE_OTHER): Payer: Medicare Other

## 2021-02-16 ENCOUNTER — Telehealth: Payer: Self-pay | Admitting: Cardiology

## 2021-02-16 ENCOUNTER — Other Ambulatory Visit (HOSPITAL_COMMUNITY)
Admission: RE | Admit: 2021-02-16 | Discharge: 2021-02-16 | Disposition: A | Discharge status: Home / Self Care | Payer: Medicare Other | Source: Ambulatory Visit | Attending: Cardiology | Admitting: Cardiology

## 2021-02-16 ENCOUNTER — Ambulatory Visit (INDEPENDENT_AMBULATORY_CARE_PROVIDER_SITE_OTHER): Payer: Medicare Other | Admitting: Gastroenterology

## 2021-02-16 ENCOUNTER — Other Ambulatory Visit: Payer: Self-pay

## 2021-02-16 ENCOUNTER — Ambulatory Visit (INDEPENDENT_AMBULATORY_CARE_PROVIDER_SITE_OTHER)
Admission: RE | Admit: 2021-02-16 | Discharge: 2021-02-16 | Disposition: A | Discharge status: Home / Self Care | Payer: Medicare Other | Source: Ambulatory Visit | Attending: Gastroenterology | Admitting: Gastroenterology

## 2021-02-16 VITALS — BP 180/80 | HR 68 | Ht 69.5 in | Wt 125.1 lb

## 2021-02-16 DIAGNOSIS — Z8507 Personal history of malignant neoplasm of pancreas: Secondary | ICD-10-CM

## 2021-02-16 DIAGNOSIS — R109 Unspecified abdominal pain: Secondary | ICD-10-CM

## 2021-02-16 DIAGNOSIS — Z952 Presence of prosthetic heart valve: Secondary | ICD-10-CM

## 2021-02-16 DIAGNOSIS — C649 Malignant neoplasm of unspecified kidney, except renal pelvis: Secondary | ICD-10-CM

## 2021-02-16 DIAGNOSIS — I25119 Atherosclerotic heart disease of native coronary artery with unspecified angina pectoris: Secondary | ICD-10-CM

## 2021-02-16 LAB — CBC
HCT: 37 % — ABNORMAL LOW (ref 39.0–52.0)
Hemoglobin: 12.3 g/dL — ABNORMAL LOW (ref 13.0–17.0)
MCHC: 33.3 g/dL (ref 30.0–36.0)
MCV: 94.8 fl (ref 78.0–100.0)
Platelets: 113 10*3/uL — ABNORMAL LOW (ref 150.0–400.0)
RBC: 3.91 Mil/uL — ABNORMAL LOW (ref 4.22–5.81)
RDW: 14.6 % (ref 11.5–15.5)
WBC: 4.2 10*3/uL (ref 4.0–10.5)

## 2021-02-16 LAB — BASIC METABOLIC PANEL
Anion gap: 5 (ref 5–15)
BUN: 21 mg/dL (ref 8–23)
CO2: 30 mmol/L (ref 22–32)
Calcium: 8.8 mg/dL — ABNORMAL LOW (ref 8.9–10.3)
Chloride: 101 mmol/L (ref 98–111)
Creatinine, Ser: 1.41 mg/dL — ABNORMAL HIGH (ref 0.61–1.24)
GFR, Estimated: 51 mL/min — ABNORMAL LOW (ref >60)
Glucose, Bld: 96 mg/dL (ref 70–99)
Potassium: 4.9 mmol/L (ref 3.5–5.1)
Sodium: 136 mmol/L (ref 135–145)

## 2021-02-16 LAB — COMPREHENSIVE METABOLIC PANEL
ALT: 21 U/L (ref 0–53)
AST: 25 U/L (ref 0–37)
Albumin: 3.6 g/dL (ref 3.5–5.2)
Alkaline Phosphatase: 109 U/L (ref 39–117)
BUN: 22 mg/dL (ref 6–23)
CO2: 29 mEq/L (ref 19–32)
Calcium: 8.9 mg/dL (ref 8.4–10.5)
Chloride: 99 mEq/L (ref 96–112)
Creatinine, Ser: 1.49 mg/dL (ref 0.40–1.50)
GFR: 45.03 mL/min — ABNORMAL LOW (ref >60.00)
Glucose, Bld: 134 mg/dL — ABNORMAL HIGH (ref 70–99)
Potassium: 4.3 mEq/L (ref 3.5–5.1)
Sodium: 135 mEq/L (ref 135–145)
Total Bilirubin: 0.4 mg/dL (ref 0.2–1.2)
Total Protein: 6.4 g/dL (ref 6.0–8.3)

## 2021-02-16 NOTE — Telephone Encounter (Signed)
Per wife, she and patient are currently riding on the road after seeing Dr. [name] in [place]. Reports patient was too weak to walk into office. Reports having a lot of fatigue and sleeping a lot. Reports BP & HR has been good. BP around 120/60 and HR 70's. Took last dose of carvedilol yesterday. Advised that provider would be notified and keep visit with [facility] on 02/18/21 as planned. Advised if symptoms got worse, to go to the ED for an evaluation. Verbalized understanding of plan.

## 2021-02-16 NOTE — Progress Notes (Signed)
Review of pertinent gastrointestinal problems: 1. 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  Adjuvant chemotherapy completed 06/2018. Dr. Benay Spice. 2.  Upper GI bleeding from [place] anastomotic ulcer EGD while hospitalized April 2021, H. pylori biopsies negative.  Repeat EGD June 2021 showed typical Whipple anatomy, adherent endoclips at [place] anastomosis without any sign of the previously noted anastomotic ulcer.  He was recommended to stay on proton pump inhibitor twice daily and iron once daily. 3.  Twisting abdominal pain 2022: CT scan abdomen pelvis with IV and oral contrast showed no evidence of recurrent cancer.  EGD April 2022 typical Whipple anatomy, 3 endoclips is still present at site of previously noted anastomotic ulcer.  No sign of foreign body reaction.  Biopsies taken from the area showed no sign of infection or neoplasm.   HPI: This is a very pleasant 75 year old man whom I last saw the time of EGD about 2 months ago.  See those results summarized above.  His weight is down 2 pounds since his last office visit here about 5 months ago.  He is here with his wife today.  He tells me that he feels "strange" he has a pain in his left lower quadrant at times.  He thinks this started around the time of doing some yard work.  He has no nausea or vomiting.  He does have chronic loose stools.  He does continue to take his Creon with every meal and with every snack as well.  He is felt very very lethargic recently.  He is seeing his cardiologist soon about possibly stopping or cutting back his carvedilol which is a relatively new medicine for him.  He has no overt GI bleeding.  He has an appointment with medical oncology as well as surgical oncology later this week.  ROS: complete GI ROS as  described in HPI, all other review negative.  Constitutional:  No unintentional weight loss   Past Medical History:  Diagnosis Date  . Arthritis    back  . Blood transfusion without reported diagnosis   . CAD (coronary artery disease)    a. 11/2016 in [place]: STEMI s/p DES to mid LAD, DES to diagonal, DES x 2 to proximal and mid RCA b. 08/2019: NSTEMI with DES to distal RCA  . Essential hypertension   . Full dentures   . GERD (gastroesophageal reflux disease)   . History of stroke   . Hyperlipidemia   . Myocardial infarction ([place])   . Pancreatic cancer ([place])   . Pneumonia   . Renal cell carcinoma ([place])   . S/P TAVR (transcatheter aortic valve replacement)    a. 08/2017:  Edwards Sapien 3 THV (size 26 mm, model #9600CM26A , serial # [id number])  . Severe aortic stenosis    a. 08/2017: s/p TAVR by Dr. Angelena Form and Dr. Cyndia Bent. 2021 NO AVS on echo  . Type 2 diabetes mellitus ([place])   . Wears glasses   . Wears hearing aid     Past Surgical History:  Procedure Laterality Date  . APPENDECTOMY    . CARDIAC CATHETERIZATION    . CORONARY ANGIOGRAPHY N/A 08/23/2019   Procedure: CORONARY ANGIOGRAPHY;  Surgeon: Belva Crome, MD;  Location: [facility];  Service: Cardiovascular;  Laterality: N/A;  . CORONARY STENT INTERVENTION N/A 08/23/2019   Procedure: CORONARY STENT INTERVENTION;  Surgeon: Belva Crome, MD;  Location: [facility];  Service: Cardiovascular;  Laterality: N/A;  . CORONARY STENT PLACEMENT     x 4 in March 2018  . ESOPHAGOGASTRODUODENOSCOPY (EGD) WITH PROPOFOL N/A 01/02/2020   Procedure: ESOPHAGOGASTRODUODENOSCOPY (EGD) WITH PROPOFOL;  Surgeon: Jackquline Denmark, MD;  Location: [facility];  Service: Endoscopy;  Laterality: N/A;  . EUS N/A 08/02/2017   Procedure: UPPER ENDOSCOPIC ULTRASOUND (EUS) RADIAL;  Surgeon: [name], MD;  Location: [facility];  Service: Endoscopy;  Laterality: N/A;  . EYE SURGERY Left   . HAND SURGERY Bilateral   . HEMOSTASIS CLIP  PLACEMENT  01/02/2020   Procedure: HEMOSTASIS CLIP PLACEMENT;  Surgeon: Jackquline Denmark, MD;  [facility]: [facility];  Service: Endoscopy;;  . HERNIA REPAIR Right   . HOT HEMOSTASIS N/A 01/02/2020   Procedure: HOT HEMOSTASIS (ARGON PLASMA COAGULATION/BICAP);  Surgeon: Jackquline Denmark, MD;  Location: [facility];  Service: Endoscopy;  Laterality: N/A;  . LAPAROSCOPY N/A 02/19/2018   Procedure: DIAGNOSTIC LAPAROSCOPY, ERAS PATHWAY;  Surgeon: [name], MD;  Location: [facility];  Service: General;  Laterality: N/A;  . MULTIPLE TOOTH EXTRACTIONS    . NEPHRECTOMY Left 02/19/2018   Procedure: OPEN LEFT RADICAL NEPHRECTOMY;  Surgeon: Cleon Gustin, MD;  Location: [facility];  Service: Urology;  Laterality: Left;  . PORT-A-CATH REMOVAL N/A 06/13/2019   Procedure: PORT REMOVAL;  Surgeon: [name], MD;  Location: [facility];  Service: General;  Laterality: N/A;  . PORTACATH PLACEMENT N/A 09/15/2017   Procedure: INSERTION PORT-A-CATH;  Surgeon: [name], MD;  Location: [place];  Service: General;  Laterality: N/A;  . RIGHT/LEFT HEART CATH AND CORONARY ANGIOGRAPHY N/A 07/05/2017   Procedure: RIGHT/LEFT HEART CATH AND CORONARY ANGIOGRAPHY;  Surgeon: [name], MD;  Location: [facility];  Service: Cardiovascular;  Laterality: N/A;  . SCLEROTHERAPY  01/02/2020   Procedure: SCLEROTHERAPY;  Surgeon: Jackquline Denmark, MD;  [facility];  Service: Endoscopy;;  . SHOULDER ARTHROSCOPY WITH ROTATOR CUFF REPAIR Left   . TEE WITHOUT CARDIOVERSION N/A 08/22/2017   Procedure: TRANSESOPHAGEAL ECHOCARDIOGRAM (TEE);  Surgeon: Burnell Blanks, MD;  Location: [facility];  Service: Open Heart Surgery;  Laterality: N/A;  . TRANSCATHETER AORTIC VALVE REPLACEMENT, TRANSFEMORAL N/A 08/22/2017   Procedure: TRANSCATHETER AORTIC VALVE REPLACEMENT, TRANSFEMORAL;  Surgeon: Burnell Blanks, MD;  Location: [facility];  Service: Open Heart Surgery;  Laterality: N/A;  . UPPER GASTROINTESTINAL  ENDOSCOPY    . WHIPPLE PROCEDURE N/A 02/19/2018   Procedure: WHIPPLE PROCEDURE;  Surgeon: [name], MD;  Location: [facility] [place];  Service: General;  Laterality: N/A;    Current Outpatient Medications  Medication Sig Dispense Refill  . atorvastatin (LIPITOR) 20 MG tablet TAKE 1 TABLET BY MOUTH ONCE DAILY. 90 tablet 3  . carvedilol (COREG) 3.125 MG tablet Take 1 tablet (3.125 mg total) by mouth 2 (two) times daily. 180 tablet 1  . clopidogrel (PLAVIX) 75 MG tablet TAKE 1 TABLET BY MOUTH ONCE DAILY WITH BREAKFAST. 90 tablet 3  . CREON 36000 units CPEP capsule TAKE 1 CAPSULE BY MOUTH BEFORE EACH MEAL AND SNACK. TAKE UP TO 6 CAPSULES PER DAY. 90 capsule 0  . ferrous sulfate 325 (65 FE) MG tablet Take 325 mg by mouth daily with breakfast.    . insulin glargine (LANTUS SOLOSTAR) 100 UNIT/ML Solostar Pen Inject 11 Units into the skin daily. 15 mL 3  . insulin lispro (HUMALOG KWIKPEN) 100 UNIT/ML KwikPen Inject 5-8 Units into the skin 3 (three) times daily before meals. Inject 5-8 units 3  times a day with meals. 15 mL 3  . Insulin Pen Needle (PEN NEEDLES) 31G X 6 MM MISC Use as directed to check blood sugar 4 times a day. 400 each 2  . nitroGLYCERIN (NITROSTAT) 0.4 MG SL tablet Place 1 tablet (0.4 mg total) under the tongue every 5 (five) minutes x 3 doses as needed for chest pain (if no relief after 2nd dose, proceed to the ED for an evaluation or call 911). (Patient not taking: Reported on 02/16/2021) 25 tablet 3  . pantoprazole (PROTONIX) 40 MG tablet Take 1 tablet (40 mg total) by mouth 2 (two) times daily. 60 tablet 11   No current facility-administered medications for this visit.    Allergies as of 02/16/2021  . (No Known Allergies)    Family History  Problem Relation Age of Onset  . Lupus Mother   . CAD Brother   . Hypertension Brother   . Colon cancer Neg Hx   . Esophageal cancer Neg Hx   . Rectal cancer Neg Hx   . Stomach cancer Neg Hx     Social History   Socioeconomic History  .  Marital status: Married    Spouse name: Not on file  . Number of children: Not on file  . Years of education: Not on file  . Highest education level: Not on file  Occupational History  . Not on file  Tobacco Use  . Smoking status: Former Smoker    Packs/day: 1.00    Years: 57.00    Pack years: 57.00    Types: Cigarettes    Start date: 09/12/1958    Quit date: 04/12/2016    Years since quitting: 4.8  . Smokeless tobacco: Never Used  Vaping Use  . Vaping Use: Never used  Substance and Sexual Activity  . Alcohol use: No  . Drug use: No  . Sexual activity: Yes  Other Topics Concern  . Not on file  Social History Narrative  . Not on file   Social Determinants of Health   Financial Resource Strain: Not on file  Food Insecurity: No Food Insecurity  . Worried About Charity fundraiser in the Last Year: Never true  . Ran Out of Food in the Last Year: Never true  Transportation Needs: No Transportation Needs  . Lack of Transportation (Medical): No  . Lack of Transportation (Non-Medical): No  Physical Activity: Not on file  Stress: Not on file  Social Connections: Not on file  Intimate Partner Violence: Not on file     Physical Exam: BP (!) 180/80 (BP Location: Left Arm, Patient Position: Sitting, Cuff Size: Normal)   Pulse 68 Comment: irregular  Ht 5' 9.5" (1.765 m)   Wt 125 lb 2 oz (56.8 kg)   BMI 18.21 kg/m  Constitutional: generally well-appearing Psychiatric: alert and oriented x3 Abdomen: soft, slightly tender left lower quadrant, slightly distended as well, no obvious ascites, no peritoneal signs, normal bowel sounds No peripheral edema noted in lower extremities  Assessment and plan: 75 y.o. male with intermittent vague abdominal pains status post Whipple for node positive pancreatic adenocarcinoma 2019  He has had upper endoscopy recently, CT scan as well.  Unclear etiology of his pains.  They certainly could be adhesive related.  No sign of recurrent cancer  however I did explain to him that given his node positive disease and the fact that this is pancreatic adenocarcinoma there is certainly a good chance that pancreatic cancer is still present.  He is  somewhat tender on exam and a little bit distended.  Almost like a partial small bowel obstruction (he is not vomiting and he does move his bowels daily).  I am getting a 2 view KUB as well as blood work today including a CBC, complete metabolic and CA 99-3.  Please see the "Patient Instructions" section for addition details about the plan.  Owens Loffler, MD [facility] 02/16/2021, 8:54 AM   Total time on date of encounter was 30 minutes (this included time spent preparing to see the patient reviewing records; obtaining and/or reviewing separately obtained history; performing a medically appropriate exam and/or evaluation; counseling and educating the patient and family if present; ordering medications, tests or procedures if applicable; and documenting clinical information in the health record).

## 2021-02-16 NOTE — Patient Instructions (Signed)
If you are age 77 or older, your body mass index should be between 23-30. Your Body mass index is 18.21 kg/m. If this is out of the aforementioned range listed, please consider follow up with your Primary Care Provider. __________________________________________________________  The [facility] providers would like to encourage you to use [facility] to communicate with providers for non-urgent requests or questions.  Due to long hold times on the telephone, sending your provider a message by [facility] may be a faster and more efficient way to get a response.  Please allow 48 business hours for a response.  Please remember that this is for non-urgent requests.  __________________________________________________________  Your provider has requested that you go to the basement level for lab work before leaving today. Press "B" on the elevator. The lab is located at the first door on the left as you exit the elevator.  Your provider has requested that you go to the basement level for an Xray before leaving today.  Press "B" on the elevator.  The Xray lab is straight across the hallway as you exit the elevator.  Due to recent changes in healthcare laws, you may see the results of your imaging and laboratory studies on MyChart before your provider has had a chance to review them.  We understand that in some cases there may be results that are confusing or concerning to you. Not all laboratory results come back in the same time frame and the provider may be waiting for multiple results in order to interpret others.  Please give [place] 48 hours in order for your provider to thoroughly review all the results before contacting the office for clarification of your results.   Thank you for entrusting me with your care and choosing [facility].  Dr [name]

## 2021-02-16 NOTE — Telephone Encounter (Signed)
New message    Pt c/o medication issue:  1. Name of Medication: carvedilol (COREG) 3.125 MG tablet  2. How are you currently taking this medication (dosage and times per day)? 2 times a day  3. Are you having a reaction (difficulty breathing--STAT)?  yes  4. What is your medication issue? All he is doing is sleeping and he is in a fog all day , he stopped taking it today.

## 2021-02-17 LAB — CANCER ANTIGEN 19-9: CA 19-9: 27 U/mL (ref <34)

## 2021-02-17 NOTE — Progress Notes (Signed)
Cardiology Office Note    Date:  02/18/2021   ID:  Manuel Olson, DOB [birth year], MRN [id number]  PCP:  [name], MD  Cardiologist: Rozann Lesches, MD    Chief Complaint  Patient presents with   Follow-up    1 month visit    History of Present Illness:    Manuel Olson is a 75 y.o. 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  He was recently examined by Dr. Domenic Polite in 01/2021 and reported feeling very fatigued and having intermittent episodes of chest discomfort. Blood pressure was elevated to 184/100 during his office visit and had been elevated when checked at home as well. He was restarted on Coreg 3.125 mg twice daily and close follow-up was arranged for further medication titration.  His wife called the office on 02/16/2021 and reported he was experiencing worsening weakness and fatigue with the medication, therefore they had discontinued his Coreg.   In talking with the patient and his wife today, they report that he was having worsening fatigue and feeling "foggy" in his head with Coreg. He stopped this on Tuesday and has experienced improvement in his symptoms since. He felt like mowing the grass and taking trash to the land field yesterday which was a significant improvement as compared to his energy level over the past few weeks. His blood pressure was well controlled on Coreg but he had intermittent hypoglycemia with the medication and his insulin dosing was adjusted as well. He denies any recurrent chest pain. No dyspnea on exertion, orthopnea, PND or pitting edema.   Past Medical History:  Diagnosis Date   Arthritis    back   Blood  transfusion without reported diagnosis    CAD (coronary artery disease)    a. 11/2016 in [place]: STEMI s/p DES to mid LAD, DES to diagonal, DES x 2 to proximal and mid RCA b. 08/2019: NSTEMI with DES to distal RCA   Essential hypertension    Full dentures    GERD (gastroesophageal reflux disease)    History of stroke    Hyperlipidemia    Myocardial infarction [facility])    Pancreatic cancer ([place])    Pneumonia    Renal cell carcinoma ([place])    S/P TAVR (transcatheter aortic valve replacement)    a. 08/2017:  Edwards Sapien 3 THV (size 26 mm, model #9600CM26A , serial # [id number])   Severe aortic stenosis    a. 08/2017: s/p TAVR by Dr. Angelena Form and Dr. Cyndia Bent. 2021 NO AVS on echo   Type 2 diabetes mellitus ([place])    Wears glasses    Wears hearing aid     Past Surgical History:  Procedure Laterality Date   APPENDECTOMY     CARDIAC CATHETERIZATION     CORONARY ANGIOGRAPHY N/A 08/23/2019   Procedure: CORONARY ANGIOGRAPHY;  Surgeon: Belva Crome, MD;  Location: [place] [facility];  Service: Cardiovascular;  Laterality: N/A;   CORONARY STENT INTERVENTION N/A 08/23/2019   Procedure: CORONARY STENT INTERVENTION;  Surgeon: Belva Crome, MD;  Location: [place] [facility];  Service: Cardiovascular;  Laterality: N/A;   CORONARY STENT PLACEMENT     x  4 in March 2018   ESOPHAGOGASTRODUODENOSCOPY (EGD) WITH PROPOFOL N/A 01/02/2020   Procedure: ESOPHAGOGASTRODUODENOSCOPY (EGD) WITH PROPOFOL;  Surgeon: Jackquline Denmark, MD;  [facility];  Service: Endoscopy;  Laterality: N/A;   EUS N/A 08/02/2017   Procedure: UPPER ENDOSCOPIC ULTRASOUND (EUS) RADIAL;  Surgeon: [name], MD;  [facility]: [facility];  Service: Endoscopy;  Laterality: N/A;   EYE SURGERY Left    HAND SURGERY Bilateral    HEMOSTASIS CLIP PLACEMENT  01/02/2020   Procedure: HEMOSTASIS CLIP PLACEMENT;  Surgeon: Jackquline Denmark, MD;  [facility];  Service: Endoscopy;;   HERNIA REPAIR Right    HOT HEMOSTASIS N/A 01/02/2020    Procedure: HOT HEMOSTASIS (ARGON PLASMA COAGULATION/BICAP);  Surgeon: Jackquline Denmark, MD;  Location: [facility];  Service: Endoscopy;  Laterality: N/A;   LAPAROSCOPY N/A 02/19/2018   Procedure: DIAGNOSTIC LAPAROSCOPY, ERAS PATHWAY;  Surgeon: [name], MD;  Location: [place];  Service: General;  Laterality: N/A;   MULTIPLE TOOTH EXTRACTIONS     NEPHRECTOMY Left 02/19/2018   Procedure: OPEN LEFT RADICAL NEPHRECTOMY;  Surgeon: Cleon Gustin, MD;  Location: [place];  Service: Urology;  Laterality: Left;   PORT-A-CATH REMOVAL N/A 06/13/2019   Procedure: PORT REMOVAL;  Surgeon: [name], MD;  Location: [place];  Service: General;  Laterality: N/A;   PORTACATH PLACEMENT N/A 09/15/2017   Procedure: INSERTION PORT-A-CATH;  Surgeon: [name], MD;  Location: [place];  Service: General;  Laterality: N/A;   RIGHT/LEFT HEART CATH AND CORONARY ANGIOGRAPHY N/A 07/05/2017   Procedure: RIGHT/LEFT HEART CATH AND CORONARY ANGIOGRAPHY;  Surgeon: [name], MD;  Location: [place] [facility];  Service: Cardiovascular;  Laterality: N/A;   SCLEROTHERAPY  01/02/2020   Procedure: SCLEROTHERAPY;  Surgeon: Jackquline Denmark, MD;  [facility];  Service: Endoscopy;;   SHOULDER ARTHROSCOPY WITH ROTATOR CUFF REPAIR Left    TEE WITHOUT CARDIOVERSION N/A 08/22/2017   Procedure: TRANSESOPHAGEAL ECHOCARDIOGRAM (TEE);  Surgeon: Burnell Blanks, MD;  Location: [facility];  Service: Open Heart Surgery;  Laterality: N/A;   TRANSCATHETER AORTIC VALVE REPLACEMENT, TRANSFEMORAL N/A 08/22/2017   Procedure: TRANSCATHETER AORTIC VALVE REPLACEMENT, TRANSFEMORAL;  Surgeon: Burnell Blanks, MD;  Location: [place];  Service: Open Heart Surgery;  Laterality: N/A;   UPPER GASTROINTESTINAL ENDOSCOPY     WHIPPLE PROCEDURE N/A 02/19/2018   Procedure: WHIPPLE PROCEDURE;  Surgeon: [name], MD;  Location: [facility];  Service: General;  Laterality: N/A;    Current Medications: Outpatient Medications  Prior to Visit  Medication Sig Dispense Refill   atorvastatin (LIPITOR) 20 MG tablet TAKE 1 TABLET BY MOUTH ONCE DAILY. 90 tablet 3   clopidogrel (PLAVIX) 75 MG tablet TAKE 1 TABLET BY MOUTH ONCE DAILY WITH BREAKFAST. 90 tablet 3   CREON 36000 units CPEP capsule TAKE 1 CAPSULE BY MOUTH BEFORE EACH MEAL AND SNACK. TAKE UP TO 6 CAPSULES PER DAY. 90 capsule 0   ferrous sulfate 325 (65 FE) MG tablet Take 325 mg by mouth daily with breakfast.     insulin glargine (LANTUS SOLOSTAR) 100 UNIT/ML Solostar Pen Inject 11 Units into the skin daily. 15 mL 3   insulin lispro (HUMALOG KWIKPEN) 100 UNIT/ML KwikPen Inject 5-8 Units into the skin 3 (three) times daily before meals. Inject 5-8 units 3 times a day with meals. 15 mL 3   Insulin Pen Needle (PEN NEEDLES) 31G X 6 MM MISC Use as directed to check blood sugar 4 times a day. 400 each 2   nitroGLYCERIN (NITROSTAT) 0.4  MG SL tablet Place 1 tablet (0.4 mg total) under the tongue every 5 (five) minutes x 3 doses as needed for chest pain (if no relief after 2nd dose, proceed to the ED for an evaluation or call 911). 25 tablet 3   pantoprazole (PROTONIX) 40 MG tablet Take 1 tablet (40 mg total) by mouth 2 (two) times daily. 60 tablet 11   carvedilol (COREG) 3.125 MG tablet Take 1 tablet (3.125 mg total) by mouth 2 (two) times daily. 180 tablet 1   No facility-administered medications prior to visit.     Allergies:   Patient has no known allergies.   Social History   Socioeconomic History   Marital status: Married    Spouse name: Not on file   Number of children: Not on file   Years of education: Not on file   Highest education level: Not on file  Occupational History   Not on file  Tobacco Use   Smoking status: Former    Packs/day: 1.00    Years: 57.00    Pack years: 57.00    Types: Cigarettes    Start date: 09/12/1958    Quit date: 04/12/2016    Years since quitting: 4.8   Smokeless tobacco: Never  Vaping Use   Vaping Use: Never used  Substance  and Sexual Activity   Alcohol use: No   Drug use: No   Sexual activity: Yes  Other Topics Concern   Not on file  Social History Narrative   Not on file   Social Determinants of Health   Financial Resource Strain: Not on file  Food Insecurity: No Food Insecurity   Worried About [place] in the Last Year: Never true   [place] in the Last Year: Never true  Transportation Needs: No Transportation Needs   Lack of Transportation (Medical): No   Lack of Transportation (Non-Medical): No  Physical Activity: Not on file  Stress: Not on file  Social Connections: Not on file     Family History:  The patient's family history includes CAD in his brother; Hypertension in his brother; Lupus in his mother.   Review of Systems:    Please see the history of present illness.     All other systems reviewed and are otherwise negative except as noted above.   Physical Exam:    VS:  BP (!) 148/86   Pulse 84   Ht 5' 9"  (1.753 m)   Wt 122 lb 6.4 oz (55.5 kg)   SpO2 95%   BMI 18.08 kg/m    General: Thin, pleasant male appearing in no acute distress. Head: Normocephalic, atraumatic. Neck: No carotid bruits. JVD not elevated.  Lungs: Respirations regular and unlabored, without wheezes or rales.  Heart: Regular rate and rhythm. No S3 or S4. 2/6 systolic murmur along RUSB.  Abdomen: Appears non-distended. No obvious abdominal masses. Msk:  Strength and tone appear normal for age. No obvious joint deformities or effusions. Extremities: No clubbing or cyanosis. No pitting edema.  Distal pedal pulses are 2+ bilaterally. Neuro: Alert and oriented X 3. Moves all extremities spontaneously. No focal deficits noted. Psych:  Responds to questions appropriately with a normal affect. Skin: No rashes or lesions noted  Wt Readings from Last 3 Encounters:  02/18/21 122 lb 6.4 oz (55.5 kg)  02/16/21 125 lb 2 oz (56.8 kg)  01/15/21 124 lb (56.2 kg)     Studies/Labs Reviewed:   EKG:   EKG is not ordered today.  Recent Labs: 02/16/2021: ALT 21; BUN 21; Creatinine, Ser 1.41; Hemoglobin 12.3; Platelets 113.0; Potassium 4.9; Sodium 136   Lipid Panel    Component Value Date/Time   CHOL 92 10/09/2019 1019   TRIG 71.0 10/09/2019 1019   HDL 49.70 10/09/2019 1019   CHOLHDL 2 10/09/2019 1019   VLDL 14.2 10/09/2019 1019   LDLCALC 28 10/09/2019 1019    Additional studies/ records that were reviewed today include:   Cardiac Catheterization: 08/2019 A stent was successfully placed.   Non-ST elevation myocardial infarction due to de novo plaque rupture in the distal RCA. Complicated but ultimately successful distal RCA stent using a 22 x 2.75 Onyx postdilated to 3.0 decreasing 99% stenosis to 0% with TIMI grade III flow. Left main is widely patent LAD contains a patent mid vessel stent.  Proximal to the stent is eccentric 50% narrowing.  First diagonal contains a stent that is widely patent. A moderate size ramus intermedius contains 50 to 75% stenosis in its mid segment. Circumflex is relatively small and is widely patent. The SAPIEN TAVR valve was not crossed.   RECOMMENDATIONS:   Continue aspirin and Plavix. Potentially eligible for discharge tomorrow. Kidney function needs to be monitored closely and if there is a rise in creatinine tomorrow morning he should not be discharged but she did have a creatinine done 48 hours after the procedure which would be Sunday morning.  Echocardiogram: 07/2020 IMPRESSIONS     1. Left ventricular ejection fraction, by estimation, is 45 to 50%. The  left ventricle has mildly decreased function. The left ventricle  demonstrates global hypokinesis. Left ventricular diastolic parameters are  consistent with Grade I diastolic  dysfunction (impaired relaxation). Elevated left atrial pressure.   2. Right ventricular systolic function is normal. The right ventricular  size is normal. There is normal pulmonary artery systolic pressure.    3. Left atrial size was mildly dilated.   4. The mitral valve is normal in structure. No evidence of mitral valve  regurgitation. No evidence of mitral stenosis.   5. Edwards Sapien 3 THV size 26 mm is in the AV position with normal  function. . The aortic valve has been repaired/replaced. There is mild  calcification of the aortic valve. There is mild thickening of the aortic  valve. Aortic valve regurgitation is  not visualized. No aortic stenosis is present.   6. The inferior vena cava is normal in size with greater than 50%  respiratory variability, suggesting right atrial pressure of 3 mmHg.    Assessment:    1. Coronary artery disease involving native coronary artery of native heart without angina pectoris   2. S/P TAVR (transcatheter aortic valve replacement)   3. Medication management   4. Essential hypertension   5. Ischemic cardiomyopathy   6. Stage 3b chronic kidney disease ([place])      Plan:   In order of problems listed above:  1. CAD - He is s/p DES to LAD, DES to D1, and DESx2 to RCA in 2018 with NSTEMI in 08/2019 with DES to the distal RCA. - He was previously having episodes of chest pain in the setting of elevated BP but denies any recent recurrence.  - Continue current medication regimen with Plavix 78m daily and Atorvastatin 297mdaily. Intolerant to BB therapy as outlined above.   2. Aortic Stenosis - He is s/p TAVR in 08/2017 and most recent echocardiogram in 07/2020 showed normal valve function with no evidence of stenosis.   3. HTN - His  BP improved with Coreg but he has been unable to tolerate it as outlined above. He was previously on Losartan and tolerated well but was stopped due to hypotension at that time. Given his BP at 148/86 (similar value by repeat check), will restart Losartan 12.71m daily. Recheck BMET in 2 weeks. If he is unable to tolerate or renal function worsens, would try Amlodipine next.   4. HFmrEF - His EF was at 45-50% in 07/2020.  He does not appear volume overloaded by examination today. Would be a candidate for SGLT2 inhibitor therapy but would not plan to initiate at this time given his variable glucose when checked at home. Would await for this to equilibrate given his recent issues.   5. Stage 3 CKD/History of RCC (s/p nephrectomy) - His creatinine was at 1.41 when checked earlier this week with baseline of 1.4 - 1.6. Will recheck BMET in 2 weeks following resumption of Losartan. If renal function has worsened, would stop and try Amlodipine.   They wish to keep scheduled follow-up with Dr. MDomenic Politefor later this month. I encouraged them to keep a BP log and bring with them at that time.    Medication Adjustments/Labs and Tests Ordered: Current medicines are reviewed at length with the patient today.  Concerns regarding medicines are outlined above.  Medication changes, Labs and Tests ordered today are listed in the Patient Instructions below. Patient Instructions  Medication Instructions:  Your physician has recommended you make the following change in your medication:   Start Losartan 12.5 mg Daily   *If you need a refill on your cardiac medications before your next appointment, please call your pharmacy*   Lab Work: Your physician recommends that you return for lab work in: 2 [place](03/04/21)    If you have labs (blood work) drawn today and your tests are completely normal, you will receive your results only by: [place]if you have [place] OR A paper copy in the mail If you have any lab test that is abnormal or we need to change your treatment, we will call you to review the results.   Testing/Procedures: NONE    Follow-Up: [facility] you and your health needs are our priority.  As part of our continuing mission to provide you with exceptional heart care, we have created designated Provider Care Teams.  These Care Teams include your primary Cardiologist (physician) and Advanced  Practice Providers (APPs -  Physician Assistants and Nurse Practitioners) who all work together to provide you with the care you need, when you need it.  We recommend signing up for the patient portal called "MyChart".  Sign up information is provided on this After Visit Summary.  MyChart is used to connect with patients for Virtual Visits (Telemedicine).  Patients are able to view lab/test results, encounter notes, upcoming appointments, etc.  Non-urgent messages can be sent to your provider as well.   To learn more about what you can do with MyChart, go to [email or web address]    Your next appointment:    As Planned   The format for your next appointment:   In Person  Provider:   SRozann Lesches MD   Other Instructions Thank you for choosing [facility]     Signed, BErma Heritage PA-C  02/18/2021 5:41 PM    CD[address] [address]Phone: [phone]: [phone]

## 2021-02-18 ENCOUNTER — Other Ambulatory Visit: Payer: Self-pay

## 2021-02-18 ENCOUNTER — Encounter: Payer: Self-pay | Admitting: Student

## 2021-02-18 ENCOUNTER — Ambulatory Visit (INDEPENDENT_AMBULATORY_CARE_PROVIDER_SITE_OTHER): Payer: Medicare Other | Admitting: Student

## 2021-02-18 VITALS — BP 148/86 | HR 84 | Ht 69.0 in | Wt 122.4 lb

## 2021-02-18 DIAGNOSIS — Z79899 Other long term (current) drug therapy: Secondary | ICD-10-CM

## 2021-02-18 DIAGNOSIS — I255 Ischemic cardiomyopathy: Secondary | ICD-10-CM

## 2021-02-18 DIAGNOSIS — I2510 Atherosclerotic heart disease of native coronary artery without angina pectoris: Secondary | ICD-10-CM

## 2021-02-18 DIAGNOSIS — Z952 Presence of prosthetic heart valve: Secondary | ICD-10-CM | POA: Diagnosis not present

## 2021-02-18 DIAGNOSIS — N1832 Chronic kidney disease, stage 3b: Secondary | ICD-10-CM | POA: Diagnosis not present

## 2021-02-18 DIAGNOSIS — I10 Essential (primary) hypertension: Secondary | ICD-10-CM

## 2021-02-18 MED ORDER — LOSARTAN POTASSIUM 25 MG PO TABS: 12.5000 mg | ORAL_TABLET | Freq: Every day | ORAL | 3 refills | Status: AC

## 2021-02-18 NOTE — Patient Instructions (Signed)
Medication Instructions:  Your physician has recommended you make the following change in your medication:   Start Losartan 12.5 mg Daily   *If you need a refill on your cardiac medications before your next appointment, please call your pharmacy*   Lab Work: Your physician recommends that you return for lab work in: 2 [place] (03/04/21)    If you have labs (blood work) drawn today and your tests are completely normal, you will receive your results only by: [place] (if you have [place]) OR A paper copy in the mail If you have any lab test that is abnormal or we need to change your treatment, we will call you to review the results.   Testing/Procedures: NONE    Follow-Up: At [facility], you and your health needs are our priority.  As part of our continuing mission to provide you with exceptional heart care, we have created designated Provider Care Teams.  These Care Teams include your primary Cardiologist (physician) and Advanced Practice Providers (APPs -  Physician Assistants and Nurse Practitioners) who all work together to provide you with the care you need, when you need it.  We recommend signing up for the patient portal called "MyChart".  Sign up information is provided on this After Visit Summary.  MyChart is used to connect with patients for Virtual Visits (Telemedicine).  Patients are able to view lab/test results, encounter notes, upcoming appointments, etc.  Non-urgent messages can be sent to your provider as well.   To learn more about what you can do with MyChart, go to [email or web address].    Your next appointment:    As Planned   The format for your next appointment:   In Person  Provider:   Rozann Lesches, MD   Other Instructions Thank you for choosing [facility]!

## 2021-02-19 DIAGNOSIS — K8681 Exocrine pancreatic insufficiency: Secondary | ICD-10-CM | POA: Diagnosis not present

## 2021-02-19 DIAGNOSIS — C250 Malignant neoplasm of head of pancreas: Secondary | ICD-10-CM | POA: Diagnosis not present

## 2021-02-22 ENCOUNTER — Other Ambulatory Visit: Payer: Self-pay

## 2021-02-22 ENCOUNTER — Emergency Department (HOSPITAL_COMMUNITY): Payer: Medicare Other

## 2021-02-22 ENCOUNTER — Encounter (HOSPITAL_COMMUNITY): Payer: Self-pay

## 2021-02-22 ENCOUNTER — Emergency Department (HOSPITAL_COMMUNITY)
Admission: EM | Admit: 2021-02-22 | Discharge: 2021-02-22 | Disposition: A | Discharge status: Home / Self Care | Payer: Medicare Other | Attending: Emergency Medicine | Admitting: Emergency Medicine

## 2021-02-22 DIAGNOSIS — Z8507 Personal history of malignant neoplasm of pancreas: Secondary | ICD-10-CM | POA: Diagnosis not present

## 2021-02-22 DIAGNOSIS — Z794 Long term (current) use of insulin: Secondary | ICD-10-CM | POA: Insufficient documentation

## 2021-02-22 DIAGNOSIS — R109 Unspecified abdominal pain: Secondary | ICD-10-CM | POA: Insufficient documentation

## 2021-02-22 DIAGNOSIS — I10 Essential (primary) hypertension: Secondary | ICD-10-CM | POA: Insufficient documentation

## 2021-02-22 DIAGNOSIS — Z951 Presence of aortocoronary bypass graft: Secondary | ICD-10-CM | POA: Insufficient documentation

## 2021-02-22 DIAGNOSIS — R059 Cough, unspecified: Secondary | ICD-10-CM | POA: Diagnosis not present

## 2021-02-22 DIAGNOSIS — Z7902 Long term (current) use of antithrombotics/antiplatelets: Secondary | ICD-10-CM | POA: Diagnosis not present

## 2021-02-22 DIAGNOSIS — Z79899 Other long term (current) drug therapy: Secondary | ICD-10-CM | POA: Diagnosis not present

## 2021-02-22 DIAGNOSIS — Z87891 Personal history of nicotine dependence: Secondary | ICD-10-CM | POA: Diagnosis not present

## 2021-02-22 DIAGNOSIS — U071 COVID-19: Secondary | ICD-10-CM | POA: Diagnosis not present

## 2021-02-22 DIAGNOSIS — G8929 Other chronic pain: Secondary | ICD-10-CM | POA: Insufficient documentation

## 2021-02-22 DIAGNOSIS — I2510 Atherosclerotic heart disease of native coronary artery without angina pectoris: Secondary | ICD-10-CM | POA: Insufficient documentation

## 2021-02-22 DIAGNOSIS — E119 Type 2 diabetes mellitus without complications: Secondary | ICD-10-CM | POA: Insufficient documentation

## 2021-02-22 DIAGNOSIS — Z85528 Personal history of other malignant neoplasm of kidney: Secondary | ICD-10-CM | POA: Insufficient documentation

## 2021-02-22 DIAGNOSIS — R531 Weakness: Secondary | ICD-10-CM | POA: Diagnosis not present

## 2021-02-22 DIAGNOSIS — J449 Chronic obstructive pulmonary disease, unspecified: Secondary | ICD-10-CM | POA: Diagnosis not present

## 2021-02-22 LAB — COMPREHENSIVE METABOLIC PANEL
ALT: 24 U/L (ref 0–44)
AST: 31 U/L (ref 15–41)
Albumin: 3.4 g/dL — ABNORMAL LOW (ref 3.5–5.0)
Alkaline Phosphatase: 95 U/L (ref 38–126)
Anion gap: 8 (ref 5–15)
BUN: 26 mg/dL — ABNORMAL HIGH (ref 8–23)
CO2: 26 mmol/L (ref 22–32)
Calcium: 8.3 mg/dL — ABNORMAL LOW (ref 8.9–10.3)
Chloride: 95 mmol/L — ABNORMAL LOW (ref 98–111)
Creatinine, Ser: 1.47 mg/dL — ABNORMAL HIGH (ref 0.61–1.24)
GFR, Estimated: 49 mL/min — ABNORMAL LOW (ref >60)
Glucose, Bld: 114 mg/dL — ABNORMAL HIGH (ref 70–99)
Potassium: 4.3 mmol/L (ref 3.5–5.1)
Sodium: 129 mmol/L — ABNORMAL LOW (ref 135–145)
Total Bilirubin: 0.5 mg/dL (ref 0.3–1.2)
Total Protein: 6.5 g/dL (ref 6.5–8.1)

## 2021-02-22 LAB — URINALYSIS, ROUTINE W REFLEX MICROSCOPIC
Bilirubin Urine: NEGATIVE
Glucose, UA: NEGATIVE mg/dL
Hgb urine dipstick: NEGATIVE
Ketones, ur: NEGATIVE mg/dL
Leukocytes,Ua: NEGATIVE
Nitrite: NEGATIVE
Protein, ur: NEGATIVE mg/dL
Specific Gravity, Urine: 1.013 (ref 1.005–1.030)
pH: 6 (ref 5.0–8.0)

## 2021-02-22 LAB — CBC WITH DIFFERENTIAL/PLATELET
Abs Immature Granulocytes: 0.01 10*3/uL (ref 0.00–0.07)
Basophils Absolute: 0 10*3/uL (ref 0.0–0.1)
Basophils Relative: 0 %
Eosinophils Absolute: 0 10*3/uL (ref 0.0–0.5)
Eosinophils Relative: 0 %
HCT: 33.7 % — ABNORMAL LOW (ref 39.0–52.0)
Hemoglobin: 11.1 g/dL — ABNORMAL LOW (ref 13.0–17.0)
Immature Granulocytes: 0 %
Lymphocytes Relative: 9 %
Lymphs Abs: 0.4 10*3/uL — ABNORMAL LOW (ref 0.7–4.0)
MCH: 32.1 pg (ref 26.0–34.0)
MCHC: 32.9 g/dL (ref 30.0–36.0)
MCV: 97.4 fL (ref 80.0–100.0)
Monocytes Absolute: 0.9 10*3/uL (ref 0.1–1.0)
Monocytes Relative: 18 %
Neutro Abs: 3.3 10*3/uL (ref 1.7–7.7)
Neutrophils Relative %: 73 %
Platelets: 79 10*3/uL — ABNORMAL LOW (ref 150–400)
RBC: 3.46 MIL/uL — ABNORMAL LOW (ref 4.22–5.81)
RDW: 14.1 % (ref 11.5–15.5)
WBC: 4.6 10*3/uL (ref 4.0–10.5)
nRBC: 0 % (ref 0.0–0.2)

## 2021-02-22 LAB — RESP PANEL BY RT-PCR (FLU A&B, COVID) ARPGX2
Influenza A by PCR: NEGATIVE
Influenza B by PCR: NEGATIVE
SARS Coronavirus 2 by RT PCR: POSITIVE — AB

## 2021-02-22 MED ORDER — SODIUM CHLORIDE 0.9 % IV BOLUS
1000.0000 mL | Freq: Once | INTRAVENOUS | Status: AC
  Administered 2021-02-22: 1000 mL via INTRAVENOUS

## 2021-02-22 MED ORDER — ACETAMINOPHEN 325 MG PO TABS
650.0000 mg | ORAL_TABLET | Freq: Once | ORAL | Status: AC
  Administered 2021-02-22: 650 mg via ORAL
  Filled 2021-02-22: qty 2

## 2021-02-22 MED ORDER — NIRMATRELVIR/RITONAVIR (PAXLOVID) TABLET (RENAL DOSING): 2.0000 | ORAL_TABLET | Freq: Two times a day (BID) | ORAL | 0 refills | Status: AC

## 2021-02-22 NOTE — ED Triage Notes (Signed)
Pt arrived via POV with wife stating that he hasn't felt good for a week, but became very weak yesterday

## 2021-02-22 NOTE — ED Provider Notes (Signed)
[facility] Provider Note   CSN: [id number] Arrival date & time: 02/22/21  0615     History Chief Complaint  Patient presents with   Weakness    Man[facility] is a 75 y.o. male.  HPI 75 year old male presents with generalized weakness.  History is from patient and wife.  He has been feeling fine all week and gone to multiple doctors appointments feeling good.  However this morning at around 1 AM when he got up to go to the bathroom he was so weak he could barely make it.  This is diffuse.  No fevers.  No current headaches.  He has chronic abdominal discomfort that is mild and chronic for him.  No vomiting.  He has chronic diarrhea but has been watching for melena and has not seen any.  Has started to have a cough yesterday and cannot get anything up.  No chest pain.   Past Medical History:  Diagnosis Date   Arthritis    back   Blood transfusion without reported diagnosis    CAD (coronary artery disease)    a. 11/2016 in [place]: STEMI s/p DES to mid LAD, DES to diagonal, DES x 2 to proximal and mid RCA b. 08/2019: NSTEMI with DES to distal RCA   Essential hypertension    Full dentures    GERD (gastroesophageal reflux disease)    History of stroke    Hyperlipidemia    Myocardial infarction [facility])    Pancreatic cancer ([place])    Pneumonia    Renal cell carcinoma ([place])    S/P TAVR (transcatheter aortic valve replacement)    a. 08/2017:  Edwards Sapien 3 THV (size 26 mm, model #9600CM26A , serial # [id number])   Severe aortic stenosis    a. 08/2017: s/p TAVR by Dr. Angelena Form and Dr. Cyndia Bent. 2021 NO AVS on echo   Type 2 diabetes mellitus ([place])    Wears glasses    Wears hearing aid     Patient Active Problem List   Diagnosis Date Noted   Renal cell carcinoma ([place]) 08/13/2020   Anastomotic ulcer 01/21/2020   Long term (current) use of antithrombotics/antiplatelets 01/21/2020   Upper gastrointestinal bleed 12/31/2019   Acute GI bleeding 12/30/2019   Angina  pectoris ([place]) 08/22/2019   Protein-calorie malnutrition, severe 04/05/2018   Debility 02/27/2018   Port-A-Cath in place 10/10/2017   Renal cell carcinoma of right kidney [facility]) s/p nephrectomy 02/2018 10/02/2017   Ileitis, terminal ([place]) 09/28/2017   Insulin dependent type 2 diabetes mellitus ([place]) 08/25/2017   Mixed hyperlipidemia 08/25/2017   Essential hypertension, benign    CAD (coronary artery disease)    Former smoker    Severe AS S/P TAVR  08/22/2017   Cancer of head of pancreas [facility]) s/p [place] 02/2018 08/10/2017    Past Surgical History:  Procedure Laterality Date   APPENDECTOMY     CARDIAC CATHETERIZATION     CORONARY ANGIOGRAPHY N/A 08/23/2019   Procedure: CORONARY ANGIOGRAPHY;  Surgeon: Belva Crome, MD;  Location: [place] [facility];  Service: Cardiovascular;  Laterality: N/A;   CORONARY STENT INTERVENTION N/A 08/23/2019   Procedure: CORONARY STENT INTERVENTION;  Surgeon: Belva Crome, MD;  Location: [place] [facility];  Service: Cardiovascular;  Laterality: N/A;   CORONARY STENT PLACEMENT     x 4 in March 2018   ESOPHAGOGASTRODUODENOSCOPY (EGD) WITH PROPOFOL N/A 01/02/2020   Procedure: ESOPHAGOGASTRODUODENOSCOPY (EGD) WITH PROPOFOL;  Surgeon: Jackquline Denmark, MD;  [facility];  Service: Endoscopy;  Laterality: N/A;   EUS N/A 08/02/2017   Procedure: UPPER ENDOSCOPIC ULTRASOUND (EUS) RADIAL;  Surgeon: [name], MD;  [facility]: [facility];  Service: Endoscopy;  Laterality: N/A;   EYE SURGERY Left    HAND SURGERY Bilateral    HEMOSTASIS CLIP PLACEMENT  01/02/2020   Procedure: HEMOSTASIS CLIP PLACEMENT;  Surgeon: Jackquline Denmark, MD;  [facility];  Service: Endoscopy;;   HERNIA REPAIR Right    HOT HEMOSTASIS N/A 01/02/2020   Procedure: HOT HEMOSTASIS (ARGON PLASMA COAGULATION/BICAP);  Surgeon: Jackquline Denmark, MD;  [facility];  Service: Endoscopy;  Laterality: N/A;   LAPAROSCOPY N/A 02/19/2018   Procedure: DIAGNOSTIC LAPAROSCOPY, ERAS  PATHWAY;  Surgeon: [name], MD;  Location: [place];  Service: General;  Laterality: N/A;   MULTIPLE TOOTH EXTRACTIONS     NEPHRECTOMY Left 02/19/2018   Procedure: OPEN LEFT RADICAL NEPHRECTOMY;  Surgeon: Cleon Gustin, MD;  Location: [place];  Service: Urology;  Laterality: Left;   PORT-A-CATH REMOVAL N/A 06/13/2019   Procedure: PORT REMOVAL;  Surgeon: [name], MD;  Location: [place];  Service: General;  Laterality: N/A;   PORTACATH PLACEMENT N/A 09/15/2017   Procedure: INSERTION PORT-A-CATH;  Surgeon: [name], MD;  Location: [place];  Service: General;  Laterality: N/A;   RIGHT/LEFT HEART CATH AND CORONARY ANGIOGRAPHY N/A 07/05/2017   Procedure: RIGHT/LEFT HEART CATH AND CORONARY ANGIOGRAPHY;  Surgeon: [name], MD;  Location: [place] [facility];  Service: Cardiovascular;  Laterality: N/A;   SCLEROTHERAPY  01/02/2020   Procedure: SCLEROTHERAPY;  Surgeon: Jackquline Denmark, MD;  [facility];  Service: Endoscopy;;   SHOULDER ARTHROSCOPY WITH ROTATOR CUFF REPAIR Left    TEE WITHOUT CARDIOVERSION N/A 08/22/2017   Procedure: TRANSESOPHAGEAL ECHOCARDIOGRAM (TEE);  Surgeon: Burnell Blanks, MD;  Location: [place];  Service: Open Heart Surgery;  Laterality: N/A;   TRANSCATHETER AORTIC VALVE REPLACEMENT, TRANSFEMORAL N/A 08/22/2017   Procedure: TRANSCATHETER AORTIC VALVE REPLACEMENT, TRANSFEMORAL;  Surgeon: Burnell Blanks, MD;  Location: [place];  Service: Open Heart Surgery;  Laterality: N/A;   UPPER GASTROINTESTINAL ENDOSCOPY     WHIPPLE PROCEDURE N/A 02/19/2018   Procedure: WHIPPLE PROCEDURE;  Surgeon: [name], MD;  Location: [place];  Service: General;  Laterality: N/A;       Family History  Problem Relation Age of Onset   Lupus Mother    CAD Brother    Hypertension Brother    Colon cancer Neg Hx    Esophageal cancer Neg Hx    Rectal cancer Neg Hx    Stomach cancer Neg Hx     Social History   Tobacco Use   Smoking status:  Former    Packs/day: 1.00    Years: 57.00    Pack years: 57.00    Types: Cigarettes    Start date: 09/12/1958    Quit date: 04/12/2016    Years since quitting: 4.8   Smokeless tobacco: Never  Vaping Use   Vaping Use: Never used  Substance Use Topics   Alcohol use: No   Drug use: No    Home Medications Prior to Admission medications   Medication Sig Start Date End Date Taking? Authorizing Provider  nirmatrelvir/ritonavir EUA, renal dosing, (PAXLOVID) TABS Take 2 tablets by mouth 2 (two) times daily for 5 days. Patient GFR is 49. Take nirmatrelvir (150 mg) one tablet twice daily for 5 days and ritonavir (100 mg) one tablet twice daily for 5 days. 02/22/21 02/27/21 Yes Sherwood Gambler, MD  atorvastatin (LIPITOR) 20 MG tablet TAKE 1 TABLET BY MOUTH ONCE DAILY. 04/17/20   Satira Sark, MD  clopidogrel (PLAVIX) 75 MG tablet TAKE 1 TABLET BY MOUTH ONCE DAILY WITH BREAKFAST. 05/20/20   Satira Sark, MD  CREON 36000 units CPEP capsule TAKE 1 CAPSULE BY MOUTH BEFORE EACH MEAL AND SNACK. TAKE UP TO 6 CAPSULES PER DAY. 04/09/19   Ladell Pier, MD  ferrous sulfate 325 (65 FE) MG tablet Take 325 mg by mouth daily with breakfast.    [provider]  insulin glargine (LANTUS SOLOSTAR) 100 UNIT/ML Solostar Pen Inject 11 Units into the skin daily. 12/04/20   Philemon Kingdom, MD  insulin lispro (HUMALOG KWIKPEN) 100 UNIT/ML KwikPen Inject 5-8 Units into the skin 3 (three) times daily before meals. Inject 5-8 units 3 times a day with meals. 12/04/20   Philemon Kingdom, MD  Insulin Pen Needle (PEN NEEDLES) 31G X 6 MM MISC Use as directed to check blood sugar 4 times a day. 11/10/20   Philemon Kingdom, MD  losartan (COZAAR) 25 MG tablet Take 0.5 tablets (12.5 mg total) by mouth daily. 02/18/21   Strader, Fransisco Hertz, PA-C  nitroGLYCERIN (NITROSTAT) 0.4 MG SL tablet Place 1 tablet (0.4 mg total) under the tongue every 5 (five) minutes x 3 doses as needed for chest pain (if no relief after 2nd dose,  proceed to the ED for an evaluation or call 911). 01/15/21   Satira Sark, MD  pantoprazole (PROTONIX) 40 MG tablet Take 1 tablet (40 mg total) by mouth 2 (two) times daily. 11/05/20   [name], MD    Allergies    Patient has no known allergies.  Review of Systems   Review of Systems  Constitutional:  Negative for fever.  Respiratory:  Positive for cough.   Cardiovascular:  Negative for chest pain.  Gastrointestinal:  Positive for abdominal pain (chronic) and diarrhea (chronic). Negative for vomiting.  Neurological:  Positive for weakness and light-headedness. Negative for headaches.  All other systems reviewed and are negative.  Physical Exam Updated Vital Signs BP (!) 148/78   Pulse 82   Temp (!) 100.9 F (38.3 C) (Rectal)   Resp (!) 23   Ht 5' 9"  (1.753 m)   Wt 55.3 kg   SpO2 93%   BMI 18.02 kg/m   Physical Exam Vitals and nursing note reviewed.  Constitutional:      General: He is not in acute distress.    Appearance: He is well-developed. He is not ill-appearing or diaphoretic.  HENT:     Head: Normocephalic and atraumatic.     Right Ear: External ear normal.     Left Ear: External ear normal.     Nose: Nose normal.     Mouth/Throat:     Pharynx: No oropharyngeal exudate or posterior oropharyngeal erythema.  Eyes:     General:        Right eye: No discharge.        Left eye: No discharge.     Extraocular Movements: Extraocular movements intact.     Pupils: Pupils are equal, round, and reactive to light.  Cardiovascular:     Rate and Rhythm: Normal rate and regular rhythm.     Heart sounds: Normal heart sounds.  Pulmonary:     Effort: Pulmonary effort is normal.     Breath sounds: Normal breath sounds. No wheezing.  Abdominal:     Palpations: Abdomen is soft.     Tenderness: There is  no abdominal tenderness.  Musculoskeletal:     Cervical back: Neck supple.  Skin:    General: Skin is warm and dry.  Neurological:     Mental Status: He is  alert.     Comments: Neuro   Psychiatric:        Mood and Affect: Mood is not anxious.    ED Results / Procedures / Treatments   Labs (all labs ordered are listed, but only abnormal results are displayed) Labs Reviewed  RESP PANEL BY RT-PCR (FLU A&B, COVID) ARPGX2 - Abnormal; Notable for the following components:      Result Value   SARS Coronavirus 2 by RT PCR POSITIVE (*)    All other components within normal limits  CBC WITH DIFFERENTIAL/PLATELET - Abnormal; Notable for the following components:   RBC 3.46 (*)    Hemoglobin 11.1 (*)    HCT 33.7 (*)    Platelets 79 (*)    Lymphs Abs 0.4 (*)    All other components within normal limits  COMPREHENSIVE METABOLIC PANEL - Abnormal; Notable for the following components:   Sodium 129 (*)    Chloride 95 (*)    Glucose, Bld 114 (*)    BUN 26 (*)    Creatinine, Ser 1.47 (*)    Calcium 8.3 (*)    Albumin 3.4 (*)    GFR, Estimated 49 (*)    All other components within normal limits  CULTURE, BLOOD (ROUTINE X 2)  CULTURE, BLOOD (ROUTINE X 2)  URINALYSIS, ROUTINE W REFLEX MICROSCOPIC    EKG EKG Interpretation  Date/Time:  Monday February 22 2021 06:41:26 EDT Ventricular Rate:  83 PR Interval:  183 QRS Duration: 90 QT Interval:  408 QTC Calculation: 480 R Axis:   73 Text Interpretation: Sinus rhythm Left atrial enlargement Consider anterior infarct Borderline repolarization abnormality Baseline wander in lead(s) V1 V5 overall similar to 2021 Confirmed by Sherwood Gambler [phone]) on 02/22/2021 7:00:51 AM  Radiology DG Chest 2 View  Result Date: 02/22/2021 CLINICAL DATA:  Cough and weakness for 1 week EXAM: CHEST - 2 VIEW COMPARISON:  08/10/2020 FINDINGS: Cardiac shadow is within normal limits. Findings are noted. Aortic calcifications are seen and stable. The lungs are hyperinflated without focal infiltrate or effusion. Calcified granuloma is again seen in the right mid lung. Degenerative changes of the thoracic spine. IMPRESSION: COPD  without acute abnormality. Electronically Signed   By: Inez Catalina M.D.   On: 02/22/2021 08:22    Procedures Procedures   Medications Ordered in ED Medications  sodium chloride 0.9 % bolus 1,000 mL (0 mLs Intravenous Stopped 02/22/21 0910)  acetaminophen (TYLENOL) tablet 650 mg (650 mg Oral Given 02/22/21 0756)    ED Course  I have reviewed the triage vital signs and the nursing notes.  Pertinent labs & imaging results that were available during my care of the patient were reviewed by me and considered in my medical decision making (see chart for details).  Clinical Course as of 02/22/21 0935  Mon Feb 22, 2021  1030 Rectal temp is 100.9. will add on blood cultures and investigate for infectious cause. [SG]    Clinical Course User Index [SG] Sherwood Gambler, MD   MDM Rules/Calculators/A&P                          Patient was found to have a low-grade temperature here of 100.9.  Infectious work-up started and he was found to be COVID-positive.  Otherwise  he is not hypoxic or short of breath.  He does have a cough but a clear chest x-ray and no obvious lung abnormalities on exam.  He is feeling better with the fluids and Tylenol and was able to walk to the bathroom without difficulty.  He appears stable for discharge home.  Given his significant comorbidities I discussed using Paxlovid, which family and he are hesitant about.  They would like to send the prescription and they will research more.  He otherwise appears stable for discharge home and should follow-up closely with PCP in case other outpatient management is to be entertained.  We discussed return precautions. Final Clinical Impression(s) / ED Diagnoses Final diagnoses:  COVID-19 virus infection    Rx / DC Orders ED Discharge Orders          Ordered    nirmatrelvir/ritonavir EUA, renal dosing, (PAXLOVID) TABS  2 times daily        02/22/21 0935             Sherwood Gambler, MD 02/22/21 [phone]

## 2021-02-22 NOTE — Discharge Instructions (Addendum)
If you develop high fever, severe cough or cough with blood, trouble breathing, severe headache, neck pain/stiffness, vomiting, or any other new/concerning symptoms then return to the ER for evaluation

## 2021-02-22 NOTE — ED Notes (Signed)
Pt ambulating with steady gait, pt denies dizziness at this time

## 2021-02-23 ENCOUNTER — Telehealth: Payer: Self-pay

## 2021-02-23 ENCOUNTER — Inpatient Hospital Stay: Payer: Medicare Other | Admitting: Nurse Practitioner

## 2021-02-23 ENCOUNTER — Other Ambulatory Visit: Payer: Medicare Other

## 2021-02-23 ENCOUNTER — Inpatient Hospital Stay: Payer: Medicare Other

## 2021-02-23 NOTE — Chronic Care Management (AMB) (Signed)
Chronic Care Management Pharmacy Assistant   Name: [facility]  MRN: [id number] DOB: 03-06-1946  Reason for Encounter:Diabetes Mellitus Disease State call  Recent office visits:  None  Recent consult visits:  02/18/21-Brittany Delano Metz (Cardiology)- Labs ordered. STARTED Losartan Potassium 12.5 mg take 1 daily.  DISCONTINUED Carvedilol 3.125 mg take 1 twice daily.Follow up as planned.  02/16/21-Daniel [name] MD Gertie Fey)- Office visit for abdominal pain. Labs ordered.  No medication changes. Follow up as planned.  01/15/21-Samuel McDowell MD (Cardiology)- Office visit for coronary artery disease.  Labs and EKG completed. STARTED Carvedilol 3.112m take 1 twice daily. Follow up in 1 month.  Hospital visits:  Medication Reconciliation was completed by comparing discharge summary, patient's EMR and Pharmacy list, and upon discussion with patient.  Admitted to the hospital on 02/22/21 due to COVID 19 Infection. Discharge date was 02/22/21. [facility] -[facility] Nirmatrelvir-Ritanovir Take 2 twice daily x 5 days due to COVID 19 Infection  Medication Changes at Hospital Discharge: -Changed None  Medications Discontinued at Hospital Discharge: -Stopped None  Medications that remain the same after Hospital Discharge:??  -All other medications will remain the same.    Medications: Outpatient Encounter Medications as of 02/23/2021  Medication Sig Note   atorvastatin (LIPITOR) 20 MG tablet TAKE 1 TABLET BY MOUTH ONCE DAILY.    clopidogrel (PLAVIX) 75 MG tablet TAKE 1 TABLET BY MOUTH ONCE DAILY WITH BREAKFAST.    CREON 36000 units CPEP capsule TAKE 1 CAPSULE BY MOUTH BEFORE EACH MEAL AND SNACK. TAKE UP TO 6 CAPSULES PER DAY.    ferrous sulfate 325 (65 FE) MG tablet Take 325 mg by mouth daily with breakfast.    insulin glargine (LANTUS SOLOSTAR) 100 UNIT/ML Solostar Pen Inject 11 Units into the skin  daily.    insulin lispro (HUMALOG KWIKPEN) 100 UNIT/ML KwikPen Inject 5-8 Units into the skin 3 (three) times daily before meals. Inject 5-8 units 3 times a day with meals.    Insulin Pen Needle (PEN NEEDLES) 31G X 6 MM MISC Use as directed to check blood sugar 4 times a day.    losartan (COZAAR) 25 MG tablet Take 0.5 tablets (12.5 mg total) by mouth daily.    nirmatrelvir/ritonavir EUA, renal dosing, (PAXLOVID) TABS Take 2 tablets by mouth 2 (two) times daily for 5 days. Patient GFR is 49. Take nirmatrelvir (150 mg) one tablet twice daily for 5 days and ritonavir (100 mg) one tablet twice daily for 5 days.    nitroGLYCERIN (NITROSTAT) 0.4 MG SL tablet Place 1 tablet (0.4 mg total) under the tongue every 5 (five) minutes x 3 doses as needed for chest pain (if no relief after 2nd dose, proceed to the ED for an evaluation or call 911). 02/16/2021: On hand   pantoprazole (PROTONIX) 40 MG tablet Take 1 tablet (40 mg total) by mouth 2 (two) times daily.    No facility-administered encounter medications on file as of 02/23/2021.   Recent Relevant Labs: Lab Results  Component Value Date/Time   HGBA1C 6.6 (A) 12/04/2020 11:17 AM   HGBA1C 7.2 (A) 05/21/2020 10:46 AM   HGBA1C 6.4 (H) 02/09/2018 11:32 AM   HGBA1C 8.2 (H) 11/28/2017 02:05 PM    Kidney Function Lab Results  Component Value Date/Time   CREATININE 1.47 (H) 02/22/2021 06:41 AM   CREATININE 1.41 (H) 02/16/2021 11:41 AM   CREATININE 1.67 (H) 01/09/2020 01:48 PM   CREATININE 1.67 (H) 08/27/2019 09:43  AM   CREATININE 1.2 09/07/2017 08:44 AM   GFR 45.03 (L) 02/16/2021 09:27 AM   GFRNONAA 49 (L) 02/22/2021 06:41 AM   GFRNONAA 39 (L) 01/09/2020 01:48 PM   GFRAA 45 (L) 01/09/2020 01:48 PM  All answers provided by patients wife.   Current antihyperglycemic regimen:  Humalog Kwikpen 100u/ml Inject 5-8 Units into the skin 3 (three) times daily before meals. Inject 5-8 units 3 times a day with meals Lantus solostar pen 100u/ml  Inject 11 Units  into the skin daily What recent interventions/DTPs have been made to improve glycemic control:  Per Dr Cruzita Lederer telephone note on 01/25/21 advised  If he has lows between meals, he will need to decrease the Lantus: 11 >> 8 units. If he has lows after meals, he will need to decrease the Humalog 3-6 >> 2-3 units before the 3 meals or even hold Humalog, depending on the results.  Have there been any recent hospitalizations or ED visits since last visit with CPP? Yes Patient denies hypoglycemic symptoms, including Pale, Sweaty, Shaky, Hungry, Nervous/irritable, and Vision changes Patient denies hyperglycemic symptoms, including blurry vision, excessive thirst, fatigue, polyuria, and weakness How often are you checking your blood sugar? 3-4 times daily What are your blood sugars ranging?  Fasting: 116, 120, 121 (took 2-3 units after) Before meals: 151 (took 2-3 units after) After meals: n/a Bedtime: 67 last night-usually not that low. (Drank orange juice) During the week, how often does your blood glucose drop below 70?  Once every couple weeks. Are you checking your feet daily/regularly? Yes  Adherence Review: Is the patient currently on a STATIN medication? Yes Is the patient currently on ACE/ARB medication? Yes Does the patient have >5 day gap between last estimated fill dates? Yes Lantus solostar pen 100u/ml  last filled 10/26/20  90 DS Humalog Kwikpen 1000u/ml  last filled 01/14/21  63 DS  Star Rating Drugs:  Atorvastatin 27m  Last filled 01/21/21 90DS Losartan 253m Last filled 02/18/21  90DS

## 2021-02-27 LAB — CULTURE, BLOOD (ROUTINE X 2)
Culture: NO GROWTH
Culture: NO GROWTH
Special Requests: ADEQUATE
Special Requests: ADEQUATE

## 2021-03-04 ENCOUNTER — Emergency Department (HOSPITAL_COMMUNITY): Payer: Medicare Other

## 2021-03-04 ENCOUNTER — Inpatient Hospital Stay (HOSPITAL_COMMUNITY)
Admission: EM | Admit: 2021-03-04 | Discharge: 2021-03-08 | DRG: 640 | Disposition: A | Discharge status: Home Health | Payer: Medicare Other | Attending: Internal Medicine | Admitting: Internal Medicine

## 2021-03-04 DIAGNOSIS — D509 Iron deficiency anemia, unspecified: Secondary | ICD-10-CM | POA: Diagnosis present

## 2021-03-04 DIAGNOSIS — R531 Weakness: Secondary | ICD-10-CM | POA: Diagnosis not present

## 2021-03-04 DIAGNOSIS — G9341 Metabolic encephalopathy: Secondary | ICD-10-CM | POA: Diagnosis present

## 2021-03-04 DIAGNOSIS — E1122 Type 2 diabetes mellitus with diabetic chronic kidney disease: Secondary | ICD-10-CM | POA: Diagnosis present

## 2021-03-04 DIAGNOSIS — R410 Disorientation, unspecified: Secondary | ICD-10-CM

## 2021-03-04 DIAGNOSIS — L8990 Pressure ulcer of unspecified site, unspecified stage: Secondary | ICD-10-CM | POA: Insufficient documentation

## 2021-03-04 DIAGNOSIS — I252 Old myocardial infarction: Secondary | ICD-10-CM

## 2021-03-04 DIAGNOSIS — U071 COVID-19: Secondary | ICD-10-CM | POA: Diagnosis present

## 2021-03-04 DIAGNOSIS — E119 Type 2 diabetes mellitus without complications: Secondary | ICD-10-CM

## 2021-03-04 DIAGNOSIS — J449 Chronic obstructive pulmonary disease, unspecified: Secondary | ICD-10-CM | POA: Diagnosis not present

## 2021-03-04 DIAGNOSIS — Z952 Presence of prosthetic heart valve: Secondary | ICD-10-CM

## 2021-03-04 DIAGNOSIS — Z85528 Personal history of other malignant neoplasm of kidney: Secondary | ICD-10-CM

## 2021-03-04 DIAGNOSIS — K529 Noninfective gastroenteritis and colitis, unspecified: Secondary | ICD-10-CM | POA: Diagnosis present

## 2021-03-04 DIAGNOSIS — I10 Essential (primary) hypertension: Secondary | ICD-10-CM | POA: Diagnosis not present

## 2021-03-04 DIAGNOSIS — I130 Hypertensive heart and chronic kidney disease with heart failure and stage 1 through stage 4 chronic kidney disease, or unspecified chronic kidney disease: Secondary | ICD-10-CM | POA: Diagnosis not present

## 2021-03-04 DIAGNOSIS — Z8616 Personal history of COVID-19: Secondary | ICD-10-CM | POA: Diagnosis not present

## 2021-03-04 DIAGNOSIS — Z832 Family history of diseases of the blood and blood-forming organs and certain disorders involving the immune mechanism: Secondary | ICD-10-CM

## 2021-03-04 DIAGNOSIS — Z8507 Personal history of malignant neoplasm of pancreas: Secondary | ICD-10-CM

## 2021-03-04 DIAGNOSIS — E872 Acidosis: Secondary | ICD-10-CM | POA: Diagnosis not present

## 2021-03-04 DIAGNOSIS — Z905 Acquired absence of kidney: Secondary | ICD-10-CM

## 2021-03-04 DIAGNOSIS — L89152 Pressure ulcer of sacral region, stage 2: Secondary | ICD-10-CM | POA: Diagnosis present

## 2021-03-04 DIAGNOSIS — Z7902 Long term (current) use of antithrombotics/antiplatelets: Secondary | ICD-10-CM

## 2021-03-04 DIAGNOSIS — R4182 Altered mental status, unspecified: Secondary | ICD-10-CM | POA: Diagnosis not present

## 2021-03-04 DIAGNOSIS — E871 Hypo-osmolality and hyponatremia: Secondary | ICD-10-CM | POA: Diagnosis not present

## 2021-03-04 DIAGNOSIS — Z955 Presence of coronary angioplasty implant and graft: Secondary | ICD-10-CM

## 2021-03-04 DIAGNOSIS — E860 Dehydration: Secondary | ICD-10-CM | POA: Diagnosis present

## 2021-03-04 DIAGNOSIS — E785 Hyperlipidemia, unspecified: Secondary | ICD-10-CM | POA: Diagnosis present

## 2021-03-04 DIAGNOSIS — K219 Gastro-esophageal reflux disease without esophagitis: Secondary | ICD-10-CM | POA: Diagnosis present

## 2021-03-04 DIAGNOSIS — N1832 Chronic kidney disease, stage 3b: Secondary | ICD-10-CM | POA: Diagnosis present

## 2021-03-04 DIAGNOSIS — Z87891 Personal history of nicotine dependence: Secondary | ICD-10-CM

## 2021-03-04 DIAGNOSIS — Z8673 Personal history of transient ischemic attack (TIA), and cerebral infarction without residual deficits: Secondary | ICD-10-CM

## 2021-03-04 DIAGNOSIS — N179 Acute kidney failure, unspecified: Secondary | ICD-10-CM | POA: Diagnosis not present

## 2021-03-04 DIAGNOSIS — Z794 Long term (current) use of insulin: Secondary | ICD-10-CM

## 2021-03-04 DIAGNOSIS — Z90411 Acquired partial absence of pancreas: Secondary | ICD-10-CM

## 2021-03-04 DIAGNOSIS — I2510 Atherosclerotic heart disease of native coronary artery without angina pectoris: Secondary | ICD-10-CM | POA: Diagnosis present

## 2021-03-04 DIAGNOSIS — Z8249 Family history of ischemic heart disease and other diseases of the circulatory system: Secondary | ICD-10-CM

## 2021-03-04 LAB — CBC WITH DIFFERENTIAL/PLATELET
Abs Immature Granulocytes: 0.03 10*3/uL (ref 0.00–0.07)
Basophils Absolute: 0 10*3/uL (ref 0.0–0.1)
Basophils Relative: 0 %
Eosinophils Absolute: 0 10*3/uL (ref 0.0–0.5)
Eosinophils Relative: 0 %
HCT: 35.3 % — ABNORMAL LOW (ref 39.0–52.0)
Hemoglobin: 11.9 g/dL — ABNORMAL LOW (ref 13.0–17.0)
Immature Granulocytes: 0 %
Lymphocytes Relative: 3 %
Lymphs Abs: 0.3 10*3/uL — ABNORMAL LOW (ref 0.7–4.0)
MCH: 31.2 pg (ref 26.0–34.0)
MCHC: 33.7 g/dL (ref 30.0–36.0)
MCV: 92.4 fL (ref 80.0–100.0)
Monocytes Absolute: 1.3 10*3/uL — ABNORMAL HIGH (ref 0.1–1.0)
Monocytes Relative: 13 %
Neutro Abs: 8.5 10*3/uL — ABNORMAL HIGH (ref 1.7–7.7)
Neutrophils Relative %: 84 %
Platelets: 105 10*3/uL — ABNORMAL LOW (ref 150–400)
RBC: 3.82 MIL/uL — ABNORMAL LOW (ref 4.22–5.81)
RDW: 13.7 % (ref 11.5–15.5)
WBC: 10.2 10*3/uL (ref 4.0–10.5)
nRBC: 0 % (ref 0.0–0.2)

## 2021-03-04 LAB — LIPASE, BLOOD: Lipase: 15 U/L (ref 11–51)

## 2021-03-04 LAB — COMPREHENSIVE METABOLIC PANEL
ALT: 19 U/L (ref 0–44)
AST: 22 U/L (ref 15–41)
Albumin: 3 g/dL — ABNORMAL LOW (ref 3.5–5.0)
Alkaline Phosphatase: 89 U/L (ref 38–126)
Anion gap: 8 (ref 5–15)
BUN: 22 mg/dL (ref 8–23)
CO2: 20 mmol/L — ABNORMAL LOW (ref 22–32)
Calcium: 8.7 mg/dL — ABNORMAL LOW (ref 8.9–10.3)
Chloride: 97 mmol/L — ABNORMAL LOW (ref 98–111)
Creatinine, Ser: 1.62 mg/dL — ABNORMAL HIGH (ref 0.61–1.24)
GFR, Estimated: 43 mL/min — ABNORMAL LOW (ref >60)
Glucose, Bld: 151 mg/dL — ABNORMAL HIGH (ref 70–99)
Potassium: 4.3 mmol/L (ref 3.5–5.1)
Sodium: 125 mmol/L — ABNORMAL LOW (ref 135–145)
Total Bilirubin: 0.5 mg/dL (ref 0.3–1.2)
Total Protein: 6.1 g/dL — ABNORMAL LOW (ref 6.5–8.1)

## 2021-03-04 LAB — URINALYSIS, ROUTINE W REFLEX MICROSCOPIC
Bacteria, UA: NONE SEEN
Bilirubin Urine: NEGATIVE
Glucose, UA: NEGATIVE mg/dL
Hgb urine dipstick: NEGATIVE
Ketones, ur: 5 mg/dL — AB
Leukocytes,Ua: NEGATIVE
Nitrite: NEGATIVE
Protein, ur: 30 mg/dL — AB
Specific Gravity, Urine: 1.021 (ref 1.005–1.030)
pH: 5 (ref 5.0–8.0)

## 2021-03-04 LAB — LACTIC ACID, PLASMA
Lactic Acid, Venous: 1.4 mmol/L (ref 0.5–1.9)
Lactic Acid, Venous: 1.9 mmol/L (ref 0.5–1.9)

## 2021-03-04 MED ORDER — INSULIN ASPART 100 UNIT/ML IJ SOLN
0.0000 [IU] | Freq: Every day | INTRAMUSCULAR | Status: DC
Start: 2021-03-04 — End: 2021-03-05

## 2021-03-04 MED ORDER — PANTOPRAZOLE SODIUM 40 MG PO TBEC
40.0000 mg | DELAYED_RELEASE_TABLET | Freq: Two times a day (BID) | ORAL | Status: DC
  Administered 2021-03-05 – 2021-03-08 (×8): 40 mg via ORAL
  Filled 2021-03-04 (×8): qty 1

## 2021-03-04 MED ORDER — ATORVASTATIN CALCIUM 10 MG PO TABS
20.0000 mg | ORAL_TABLET | Freq: Every day | ORAL | Status: DC
  Administered 2021-03-05 – 2021-03-07 (×4): 20 mg via ORAL
  Filled 2021-03-04 (×4): qty 2

## 2021-03-04 MED ORDER — INSULIN ASPART 100 UNIT/ML IJ SOLN: 0.0000 [IU] | Freq: Three times a day (TID) | INTRAMUSCULAR | Status: DC

## 2021-03-04 MED ORDER — SODIUM CHLORIDE 0.9 % IV SOLN: INTRAVENOUS | Status: DC

## 2021-03-04 MED ORDER — PANCRELIPASE (LIP-PROT-AMYL) 36000-114000 UNITS PO CPEP
72000.0000 [IU] | ORAL_CAPSULE | Freq: Three times a day (TID) | ORAL | Status: DC
  Administered 2021-03-05 – 2021-03-08 (×9): 72000 [IU] via ORAL
  Filled 2021-03-04 (×12): qty 2

## 2021-03-04 MED ORDER — CLOPIDOGREL BISULFATE 75 MG PO TABS
75.0000 mg | ORAL_TABLET | Freq: Every day | ORAL | Status: DC
  Administered 2021-03-05 – 2021-03-08 (×4): 75 mg via ORAL
  Filled 2021-03-04 (×4): qty 1

## 2021-03-04 MED ORDER — FERROUS SULFATE 325 (65 FE) MG PO TABS
650.0000 mg | ORAL_TABLET | Freq: Every day | ORAL | Status: DC
  Administered 2021-03-05 – 2021-03-08 (×4): 650 mg via ORAL
  Filled 2021-03-04 (×4): qty 2

## 2021-03-04 MED ORDER — ACETAMINOPHEN 650 MG RE SUPP
650.0000 mg | Freq: Four times a day (QID) | RECTAL | Status: DC | PRN
Start: 2021-03-04 — End: 2021-03-08

## 2021-03-04 MED ORDER — SODIUM CHLORIDE 0.9 % IV BOLUS
1000.0000 mL | Freq: Once | INTRAVENOUS | Status: AC
  Administered 2021-03-04: 1000 mL via INTRAVENOUS

## 2021-03-04 MED ORDER — ENOXAPARIN SODIUM 30 MG/0.3ML IJ SOSY
30.0000 mg | PREFILLED_SYRINGE | INTRAMUSCULAR | Status: DC
  Administered 2021-03-05: 30 mg via SUBCUTANEOUS
  Filled 2021-03-04 (×3): qty 0.3

## 2021-03-04 MED ORDER — ACETAMINOPHEN 325 MG PO TABS
650.0000 mg | ORAL_TABLET | Freq: Four times a day (QID) | ORAL | Status: DC | PRN
  Administered 2021-03-06 – 2021-03-07 (×2): 650 mg via ORAL
  Filled 2021-03-04 (×3): qty 2

## 2021-03-04 MED ORDER — MEGESTROL ACETATE 400 MG/10ML PO SUSP
200.0000 mg | Freq: Three times a day (TID) | ORAL | Status: DC
  Administered 2021-03-05 – 2021-03-08 (×10): 200 mg via ORAL
  Filled 2021-03-04 (×9): qty 10
  Filled 2021-03-04: qty 5
  Filled 2021-03-04 (×4): qty 10

## 2021-03-04 NOTE — H&P (Signed)
History and Physical    Manuel Olson:[id number] DOB: June 12, 1946 DOA: 03/04/2021  PCP: [name], MD Patient coming from: Home  Chief Complaint: Generalized weakness  HPI: Manuel Olson is a 75 y.o. male with medical history significant of CAD status post PCI, severe aortic stenosis status post TAVR in 2458, chronic systolic CHF (EF 45 to 09% on echo done 07/2020), history of pancreatic cancer status post Whipple procedure and chemotherapy, history of left renal cell carcinoma status post radical nephrectomy, CKD stage III, insulin-dependent type 2 diabetes, hypertension, hyperlipidemia.  He was seen in the ED on 02/22/2021 for generalized weakness, cough and diagnosed with COVID-19.  Discharged on Paxlovid but did not take it.  He returns to the ED today complaining of generalized weakness and poor appetite.  Daughter also reported some degree of confusion.  On arrival to the ED, his blood pressure was recorded as 82/58 at triage which was a false reading as subsequent blood pressure readings were normal before patient was given any IV fluids.  Borderline tachycardic initially, subsequently improved.  No fever or hypoxia.  Labs showing WBC 10.2, hemoglobin 11.9 (stable), platelet count 105K (low on previous labs as well and stable).  Sodium 125 (was 129 on 02/22/2021 but previously within normal range), potassium 4.3, chloride 97, bicarb 20, anion gap 8, BUN 22, creatinine 1.6 (baseline 1.4), glucose 151.  Lipase and LFTs normal.  Lactic acid 1.4 > 1.9.  UA without signs of infection.  Chest x-ray not suggestive of pneumonia.  CT head negative for acute intracranial abnormality. Patient was given 1 L normal saline bolus.  No history could be obtained from the patient as he is extremely hard of hearing and was not wearing his hearing aids.  History provided by wife at bedside who states that patient was diagnosed with COVID during ED visit on 6/13 and prescribed a medication which she  was not able to take as the pills were too big to swallow.  Wife is concerned that patient has been extremely weak and is barely able to walk.  He is sleeping all the time.  He continues to have cough and chest congestion but symptoms have improved since he was diagnosed with COVID.  No shortness of breath or chest pain.  No vomiting or abdominal pain.  Wife states his appetite is very poor for which his surgeon recently prescribed megestrol and he has started eating a little.  Wife states he has not taken any basal insulin for the past week as he is not eating much but his blood sugars have been within normal range.  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  Review of Systems:  All systems reviewed and apart from history of presenting illness, are negative.  Past Medical History:  Diagnosis Date   Arthritis    back   Blood transfusion without reported diagnosis    CAD (coronary artery disease)    a. 11/2016 in [place]: STEMI s/p DES to mid LAD, DES to diagonal, DES x 2 to proximal and mid RCA b. 08/2019: NSTEMI with DES to distal RCA   Essential hypertension    Full dentures    GERD (gastroesophageal reflux disease)    History of stroke    Hyperlipidemia    Myocardial infarction [facility])    Pancreatic cancer ([place])    Pneumonia    Renal cell carcinoma ([place])    S/P TAVR (transcatheter aortic valve replacement)    a.  12/2018Oletta Lamas Sapien 3 THV (size 26 mm, model #9600CM26A , serial # [id number])   Severe aortic stenosis    a. 08/2017: s/p TAVR by Dr. Angelena Form and Dr. Cyndia Bent. 2021 NO AVS on echo   Type 2 diabetes mellitus ([place])    Wears glasses    Wears hearing aid     Past Surgical History:  Procedure Laterality Date   APPENDECTOMY     CARDIAC CATHETERIZATION     CORONARY ANGIOGRAPHY N/A 08/23/2019   Procedure: CORONARY ANGIOGRAPHY;  Surgeon: Belva Crome, MD;  Location: [place] [facility];  Service: Cardiovascular;  Laterality: N/A;   CORONARY  STENT INTERVENTION N/A 08/23/2019   Procedure: CORONARY STENT INTERVENTION;  Surgeon: Belva Crome, MD;  Location: [place] [facility];  Service: Cardiovascular;  Laterality: N/A;   CORONARY STENT PLACEMENT     x 4 in March 2018   ESOPHAGOGASTRODUODENOSCOPY (EGD) WITH PROPOFOL N/A 01/02/2020   Procedure: ESOPHAGOGASTRODUODENOSCOPY (EGD) WITH PROPOFOL;  Surgeon: Jackquline Denmark, MD;  Location: [facility];  Service: Endoscopy;  Laterality: N/A;   EUS N/A 08/02/2017   Procedure: UPPER ENDOSCOPIC ULTRASOUND (EUS) RADIAL;  Surgeon: [name], MD;  Location: [facility];  Service: Endoscopy;  Laterality: N/A;   EYE SURGERY Left    HAND SURGERY Bilateral    HEMOSTASIS CLIP PLACEMENT  01/02/2020   Procedure: HEMOSTASIS CLIP PLACEMENT;  Surgeon: Jackquline Denmark, MD;  Location: [facility];  Service: Endoscopy;;   HERNIA REPAIR Right    HOT HEMOSTASIS N/A 01/02/2020   Procedure: HOT HEMOSTASIS (ARGON PLASMA COAGULATION/BICAP);  Surgeon: Jackquline Denmark, MD;  Location: [facility];  Service: Endoscopy;  Laterality: N/A;   LAPAROSCOPY N/A 02/19/2018   Procedure: DIAGNOSTIC LAPAROSCOPY, ERAS PATHWAY;  Surgeon: [name], MD;  Location: [place];  Service: General;  Laterality: N/A;   MULTIPLE TOOTH EXTRACTIONS     NEPHRECTOMY Left 02/19/2018   Procedure: OPEN LEFT RADICAL NEPHRECTOMY;  Surgeon: Cleon Gustin, MD;  Location: [place];  Service: Urology;  Laterality: Left;   PORT-A-CATH REMOVAL N/A 06/13/2019   Procedure: PORT REMOVAL;  Surgeon: [name], MD;  Location: [place];  Service: General;  Laterality: N/A;   PORTACATH PLACEMENT N/A 09/15/2017   Procedure: INSERTION PORT-A-CATH;  Surgeon: [name], MD;  Location: [place];  Service: General;  Laterality: N/A;   RIGHT/LEFT HEART CATH AND CORONARY ANGIOGRAPHY N/A 07/05/2017   Procedure: RIGHT/LEFT HEART CATH AND CORONARY ANGIOGRAPHY;  Surgeon: [name], MD;  Location: [place] [facility];  Service: Cardiovascular;   Laterality: N/A;   SCLEROTHERAPY  01/02/2020   Procedure: SCLEROTHERAPY;  Surgeon: Jackquline Denmark, MD;  [facility];  Service: Endoscopy;;   SHOULDER ARTHROSCOPY WITH ROTATOR CUFF REPAIR Left    TEE WITHOUT CARDIOVERSION N/A 08/22/2017   Procedure: TRANSESOPHAGEAL ECHOCARDIOGRAM (TEE);  Surgeon: Burnell Blanks, MD;  Location: [place];  Service: Open Heart Surgery;  Laterality: N/A;   TRANSCATHETER AORTIC VALVE REPLACEMENT, TRANSFEMORAL N/A 08/22/2017   Procedure: TRANSCATHETER AORTIC VALVE REPLACEMENT, TRANSFEMORAL;  Surgeon: Burnell Blanks, MD;  Location: [place];  Service: Open Heart Surgery;  Laterality: N/A;   UPPER GASTROINTESTINAL ENDOSCOPY     WHIPPLE PROCEDURE N/A 02/19/2018   Procedure: WHIPPLE PROCEDURE;  Surgeon: [name], MD;  Location: [place];  Service: General;  Laterality: N/A;     reports that he quit smoking about 4 years ago. His smoking use included cigarettes. He started smoking about 62 years ago. He has a 57.00 pack-year smoking history. He  has never used smokeless tobacco. He reports that he does not drink alcohol and does not use drugs.  Allergies  Allergen Reactions   Carvedilol Other (See Comments)    "Could not function when taking this"    Family History  Problem Relation Age of Onset   Lupus Mother    CAD Brother    Hypertension Brother    Colon cancer Neg Hx    Esophageal cancer Neg Hx    Rectal cancer Neg Hx    Stomach cancer Neg Hx     Prior to Admission medications   Medication Sig Start Date End Date Taking? Authorizing Provider  acetaminophen (TYLENOL) 325 MG tablet Take 325-650 mg by mouth every 6 (six) hours as needed for mild pain.   Yes [provider]  atorvastatin (LIPITOR) 20 MG tablet TAKE 1 TABLET BY MOUTH ONCE DAILY. Patient taking differently: Take 20 mg by mouth at bedtime. 04/17/20  Yes Satira Sark, MD  clopidogrel (PLAVIX) 75 MG tablet TAKE 1 TABLET BY MOUTH ONCE DAILY WITH  BREAKFAST. Patient taking differently: Take 75 mg by mouth daily with breakfast. 05/20/20  Yes Satira Sark, MD  Continuous Blood Gluc Sensor (FREESTYLE LIBRE 14 DAY SENSOR) MISC Inject 1 patch into the skin every 14 (fourteen) days.   Yes [provider]  CREON 36000 units CPEP capsule TAKE 1 CAPSULE BY MOUTH BEFORE EACH MEAL AND SNACK. TAKE UP TO 6 CAPSULES PER DAY. Patient taking differently: Take 72,000 Units by mouth 3 (three) times daily with meals. 04/09/19  Yes Ladell Pier, MD  ferrous sulfate 325 (65 FE) MG tablet Take 650 mg by mouth daily with breakfast.   Yes [provider]  Insulin Glargine (BASAGLAR KWIKPEN) 100 UNIT/ML Inject 8 Units into the skin See admin instructions. Inject 8 units into the skin in the morning, WHEN EATING   Yes [provider]  insulin lispro (HUMALOG KWIKPEN) 100 UNIT/ML KwikPen Inject 5-8 Units into the skin 3 (three) times daily before meals. Inject 5-8 units 3 times a day with meals. Patient taking differently: Inject 2-3 Units into the skin See admin instructions. Inject 2-3 units into the skin three times a day with meals, WHEN EATING 12/04/20  Yes Philemon Kingdom, MD  megestrol (MEGACE) 40 MG/ML suspension Take 200 mg by mouth 3 (three) times daily before meals. 02/26/21  Yes [provider]  nitroGLYCERIN (NITROSTAT) 0.4 MG SL tablet Place 1 tablet (0.4 mg total) under the tongue every 5 (five) minutes x 3 doses as needed for chest pain (if no relief after 2nd dose, proceed to the ED for an evaluation or call 911). Patient taking differently: Place 0.4 mg under the tongue every 5 (five) minutes x 3 doses as needed for chest pain (if no relief after 2nd dose, proceed to the ED for an evaluation or call 9-1-1). 01/15/21  Yes Satira Sark, MD  pantoprazole (PROTONIX) 40 MG tablet Take 1 tablet (40 mg total) by mouth 2 (two) times daily. 11/05/20  Yes [name], MD  insulin glargine (LANTUS SOLOSTAR) 100  UNIT/ML Solostar Pen Inject 11 Units into the skin daily. Patient not taking: No sig reported 12/04/20   Philemon Kingdom, MD  Insulin Pen Needle (PEN NEEDLES) 31G X 6 MM MISC Use as directed to check blood sugar 4 times a day. 11/10/20   Philemon Kingdom, MD  losartan (COZAAR) 25 MG tablet Take 0.5 tablets (12.5 mg total) by mouth daily. Patient not taking: No sig  reported 02/18/21   Erma Heritage, PA-C    Physical Exam: Vitals:   03/04/21 2230 03/04/21 2313 03/04/21 2315 03/04/21 2319  BP: 110/79  (!) 142/80   Pulse: 76  80 74  Resp: 13  18 15   Temp:  98.6 F (37 C)    TempSrc:  Oral    SpO2: 99%  97% 99%    Physical Exam Constitutional:      General: He is not in acute distress. HENT:     Head: Normocephalic and atraumatic.     Mouth/Throat:     Comments: Very dry mucous membranes Eyes:     Extraocular Movements: Extraocular movements intact.     Conjunctiva/sclera: Conjunctivae normal.  Cardiovascular:     Rate and Rhythm: Normal rate and regular rhythm.     Pulses: Normal pulses.  Pulmonary:     Effort: Pulmonary effort is normal. No respiratory distress.     Breath sounds: Normal breath sounds. No wheezing or rales.  Abdominal:     General: Bowel sounds are normal. There is no distension.     Palpations: Abdomen is soft.     Tenderness: There is no abdominal tenderness.  Musculoskeletal:        General: No swelling or tenderness.     Cervical back: Normal range of motion and neck supple.  Skin:    General: Skin is warm and dry.     Comments: Decreased skin turgor  Neurological:     General: No focal deficit present.     Mental Status: He is alert.     Cranial Nerves: No cranial nerve deficit.     Sensory: No sensory deficit.     Motor: No weakness.     Labs on Admission: I have personally reviewed following labs and imaging studies  CBC: Recent Labs  Lab 03/04/21 1924  WBC 10.2  NEUTROABS 8.5*  HGB 11.9*  HCT 35.3*  MCV 92.4  PLT 105*   Basic  Metabolic Panel: Recent Labs  Lab 03/04/21 1924  NA 125*  K 4.3  CL 97*  CO2 20*  GLUCOSE 151*  BUN 22  CREATININE 1.62*  CALCIUM 8.7*   GFR: Estimated Creatinine Clearance: 29.9 mL/min (A) (by C-G formula based on SCr of 1.62 mg/dL (H)). Liver Function Tests: Recent Labs  Lab 03/04/21 1924  AST 22  ALT 19  ALKPHOS 89  BILITOT 0.5  PROT 6.1*  ALBUMIN 3.0*   Recent Labs  Lab 03/04/21 1924  LIPASE 15   No results for input(s): AMMONIA in the last 168 hours. Coagulation Profile: No results for input(s): INR, PROTIME in the last 168 hours. Cardiac Enzymes: No results for input(s): CKTOTAL, CKMB, CKMBINDEX, TROPONINI in the last 168 hours. BNP (last 3 results) No results for input(s): PROBNP in the last 8760 hours. HbA1C: No results for input(s): HGBA1C in the last 72 hours. CBG: No results for input(s): GLUCAP in the last 168 hours. Lipid Profile: No results for input(s): CHOL, HDL, LDLCALC, TRIG, CHOLHDL, LDLDIRECT in the last 72 hours. Thyroid Function Tests: No results for input(s): TSH, T4TOTAL, FREET4, T3FREE, THYROIDAB in the last 72 hours. Anemia Panel: No results for input(s): VITAMINB12, FOLATE, FERRITIN, TIBC, IRON, RETICCTPCT in the last 72 hours. Urine analysis:    Component Value Date/Time   COLORURINE YELLOW 03/04/2021 2034   APPEARANCEUR HAZY (A) 03/04/2021 2034   APPEARANCEUR Clear 08/13/2020 0952   LABSPEC 1.021 03/04/2021 2034   PHURINE 5.0 03/04/2021 2034   GLUCOSEU NEGATIVE 03/04/2021  2034   HGBUR NEGATIVE 03/04/2021 2034   BILIRUBINUR NEGATIVE 03/04/2021 2034   BILIRUBINUR Negative 08/13/2020 0952   KETONESUR 5 (A) 03/04/2021 2034   PROTEINUR 30 (A) 03/04/2021 2034   NITRITE NEGATIVE 03/04/2021 2034   LEUKOCYTESUR NEGATIVE 03/04/2021 2034    Radiological Exams on Admission: CT Head Wo Contrast  Result Date: 03/04/2021 CLINICAL DATA:  Altered mental status EXAM: CT HEAD WITHOUT CONTRAST TECHNIQUE: Contiguous axial images were  obtained from the base of the skull through the vertex without intravenous contrast. COMPARISON:  MRI 07/29/2017 FINDINGS: Brain: Normal anatomic configuration. Parenchymal volume loss is commensurate with the patient's age. Moderate periventricular white matter changes are present likely reflecting the sequela of small vessel ischemia, stable since prior examination. 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 No abnormal intra or extra-axial mass lesion or fluid collection. No abnormal mass effect or midline shift. No evidence of acute intracranial hemorrhage or infarct. Ventricular size is normal. Cerebellum unremarkable. Vascular: No asymmetric hyperdense vasculature at the skull base. Skull: Intact Sinuses/Orbits: Paranasal sinuses are clear. Orbits are unremarkable. Other: Mastoid air cells and middle ear cavities are clear. IMPRESSION: No evidence of acute intracranial hemorrhage or infarct. Stable moderate senescent change. 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 Electronically Signed   By: Fidela Salisbury MD   On: 03/04/2021 21:38   DG Chest Port 1 View  Result Date: 03/04/2021 CLINICAL DATA:  History of COVID, initial encounter EXAM: PORTABLE CHEST 1 VIEW COMPARISON:  02/22/2021 FINDINGS: None cardiac shadow is stable. Changes of prior TAVR are seen. Aortic calcifications are again noted. The lungs are hyperinflated but clear. Stable granuloma is noted on the right. No acute bony abnormality noted. IMPRESSION: COPD without acute abnormality. Electronically Signed   By: Inez Catalina M.D.   On: 03/04/2021 20:17    EKG: Independently reviewed.  Sinus rhythm, new T wave inversions in inferior and lateral leads.  Assessment/Plan Principal Problem:   COVID-19 Active Problems:   CAD (coronary artery disease)   Hyponatremia   Generalized weakness   Confusion   COVID-19 viral infection Tested positive on 02/22/2021.  He was discharged from the ED on Paxlovid but was not  able to take it as the pills were too big to swallow.  At present, he does not have hypoxia or any signs of respiratory distress.  Chest x-ray not suggestive of pneumonia.  Other than generalized weakness and poor appetite, no other COVID symptoms reported. -No indication for treatment at this time.  Continue airborne and contact precautions.  Moderate hyponatremia Likely due to very poor oral intake. -IV fluid hydration with normal saline.  Monitor sodium level closely, serial BMPs.  Goal rate of correction is 4 to 6 mEq in 24 hours.  Check serum osmolarity, urine sodium, urine osmolarity, TSH, and cortisol levels.  Generalized weakness Likely due to acute viral illness. -PT/OT, fall precautions, check TSH level  Confusion Daughter reported some confusion to ED provider although difficult to assess as he is extremely hard of hearing.  No focal neurodeficit and head CT negative for acute finding.  UA without signs of infection.  Dehydration and hyponatremia could possibly be contributing. -IV fluid hydration, continue to monitor  CAD EKG showing new T wave inversions in inferior and lateral leads.  Per conversation with wife, patient has not endorsed any anginal symptoms. -Cardiac monitoring.  Stat high-sensitivity troponin x2.  Continue Plavix.  Chronic systolic CHF No signs of volume overload. -Monitor volume status closely.  CKD  stage IIIb Creatinine 1.6, no significant change from baseline. -Continue to monitor renal function  Insulin-dependent type 2 diabetes -Check A1c.  Continue to hold basal insulin at this time given poor oral intake.  Order sliding scale insulin sensitive ACHS.  Hyperlipidemia -Continue Lipitor  GERD -Continue PPI  DVT prophylaxis: Lovenox (dosed renally adjusted) Code Status: Full code-discussed with the patient's wife. Family Communication: Wife at bedside. Disposition Plan: Status is: Observation  The patient remains OBS appropriate and will d/c  before 2 midnights.  Dispo: The patient is from: Home              Anticipated d/c is to: Home              Patient currently is not medically stable to d/c.   Difficult to place patient No  Level of care: Level of care: Telemetry Cardiac  The medical decision making on this patient was of high complexity and the patient is at high risk for clinical deterioration, therefore this is a level 3 visit.  Shela Leff MD [facility]  If 7PM-7AM, please contact night-coverage [email or web address]  03/04/2021, 11:52 PM

## 2021-03-04 NOTE — ED Notes (Signed)
NT attempted to change pt into a gown. Pt refused to stating he was "too cold". RN notified.

## 2021-03-04 NOTE — ED Provider Notes (Signed)
[facility] EMERGENCY DEPARTMENT Provider Note   CSN: [id number] Arrival date & time: 03/04/21  1908     History Chief Complaint  Patient presents with   Weakness    Manuel Olson is a 75 y.o. male.  Patient brought in by daughter.  Patient lives in [place].  On June 13 patient seen in [facility] emergency department for generalized weakness fever not feeling well.  Work-up there significant for positive COVID infection.  Patient received IV fluids.  Felt better.  Patient did have a fever on presentation.  But was afebrile when discharged.  Patient discharged home on oral antivirals.  But daughter states that he was not able to swallow the pills very well so he did not take them.  Back today for generalized weakness not eating well.  No diarrhea no vomiting.  Poor appetite.  Daughter states there is a degree of confusion.  He is hard of hearing.  But still more confused today.  Temp upon arrival 98.2 heart rate 101 blood pressure out in triage was 82/58.  But when he came back here it was 683 systolic.  So must of been an erroneous blood pressure.  Patient here although hard of hearing will follow commands.  No evidence of any upper extremity or lower extremity weakness.  Seems to be verbalizing fine.  But does seem to have a degree of confusion.      Past Medical History:  Diagnosis Date   Arthritis    back   Blood transfusion without reported diagnosis    CAD (coronary artery disease)    a. 11/2016 in [place]: STEMI s/p DES to mid LAD, DES to diagonal, DES x 2 to proximal and mid RCA b. 08/2019: NSTEMI with DES to distal RCA   Essential hypertension    Full dentures    GERD (gastroesophageal reflux disease)    History of stroke    Hyperlipidemia    Myocardial infarction [facility])    Pancreatic cancer ([place])    Pneumonia    Renal cell carcinoma ([place])    S/P TAVR (transcatheter aortic valve replacement)    a. 08/2017:  Edwards Sapien 3 THV (size 26 mm, model  #9600CM26A , serial # [id number])   Severe aortic stenosis    a. 08/2017: s/p TAVR by Dr. Angelena Form and Dr. Cyndia Bent. 2021 NO AVS on echo   Type 2 diabetes mellitus ([place])    Wears glasses    Wears hearing aid     Patient Active Problem List   Diagnosis Date Noted   Renal cell carcinoma ([place]) 08/13/2020   Anastomotic ulcer 01/21/2020   Long term (current) use of antithrombotics/antiplatelets 01/21/2020   Upper gastrointestinal bleed 12/31/2019   Acute GI bleeding 12/30/2019   Angina pectoris ([place]) 08/22/2019   Protein-calorie malnutrition, severe 04/05/2018   Debility 02/27/2018   Port-A-Cath in place 10/10/2017   Renal cell carcinoma of right kidney [facility]) s/p nephrectomy 02/2018 10/02/2017   Ileitis, terminal ([place]) 09/28/2017   Insulin dependent type 2 diabetes mellitus ([place]) 08/25/2017   Mixed hyperlipidemia 08/25/2017   Essential hypertension, benign    CAD (coronary artery disease)    Former smoker    Severe AS S/P TAVR  08/22/2017   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 08/10/2017    Past Surgical History:  Procedure Laterality Date   APPENDECTOMY     CARDIAC CATHETERIZATION     CORONARY ANGIOGRAPHY N/A 08/23/2019   Procedure: CORONARY ANGIOGRAPHY;  Surgeon: Daneen Schick  W, MD;  Location: [place] CV LAB;  Service: Cardiovascular;  Laterality: N/A;   CORONARY STENT INTERVENTION N/A 08/23/2019   Procedure: CORONARY STENT INTERVENTION;  Surgeon: Belva Crome, MD;  Location: [place] CV LAB;  Service: Cardiovascular;  Laterality: N/A;   CORONARY STENT PLACEMENT     x 4 in March 2018   ESOPHAGOGASTRODUODENOSCOPY (EGD) WITH PROPOFOL N/A 01/02/2020   Procedure: ESOPHAGOGASTRODUODENOSCOPY (EGD) WITH PROPOFOL;  Surgeon: Jackquline Denmark, MD;  Location: [facility];  Service: Endoscopy;  Laterality: N/A;   EUS N/A 08/02/2017   Procedure: UPPER ENDOSCOPIC ULTRASOUND (EUS) RADIAL;  Surgeon: [name], MD;  Location: [facility];  Service: Endoscopy;   Laterality: N/A;   EYE SURGERY Left    HAND SURGERY Bilateral    HEMOSTASIS CLIP PLACEMENT  01/02/2020   Procedure: HEMOSTASIS CLIP PLACEMENT;  Surgeon: Jackquline Denmark, MD;  [facility];  Service: Endoscopy;;   HERNIA REPAIR Right    HOT HEMOSTASIS N/A 01/02/2020   Procedure: HOT HEMOSTASIS (ARGON PLASMA COAGULATION/BICAP);  Surgeon: Jackquline Denmark, MD;  Location[facility];  Service: Endoscopy;  Laterality: N/A;   LAPAROSCOPY N/A 02/19/2018   Procedure: DIAGNOSTIC LAPAROSCOPY, ERAS PATHWAY;  Surgeon: [name], MD;  Location: [place];  Service: General;  Laterality: N/A;   MULTIPLE TOOTH EXTRACTIONS     NEPHRECTOMY Left 02/19/2018   Procedure: OPEN LEFT RADICAL NEPHRECTOMY;  Surgeon: Cleon Gustin, MD;  Location: [place];  Service: Urology;  Laterality: Left;   PORT-A-CATH REMOVAL N/A 06/13/2019   Procedure: PORT REMOVAL;  Surgeon: [name], MD;  Location: [place];  Service: General;  Laterality: N/A;   PORTACATH PLACEMENT N/A 09/15/2017   Procedure: INSERTION PORT-A-CATH;  Surgeon: [name], MD;  Location: [place];  Service: General;  Laterality: N/A;   RIGHT/LEFT HEART CATH AND CORONARY ANGIOGRAPHY N/A 07/05/2017   Procedure: RIGHT/LEFT HEART CATH AND CORONARY ANGIOGRAPHY;  Surgeon: [name], MD;  Location: [place] CV LAB;  Service: Cardiovascular;  Laterality: N/A;   SCLEROTHERAPY  01/02/2020   Procedure: SCLEROTHERAPY;  Surgeon: Jackquline Denmark, MD;  [facility];  Service: Endoscopy;;   SHOULDER ARTHROSCOPY WITH ROTATOR CUFF REPAIR Left    TEE WITHOUT CARDIOVERSION N/A 08/22/2017   Procedure: TRANSESOPHAGEAL ECHOCARDIOGRAM (TEE);  Surgeon: Burnell Blanks, MD;  Location: [place];  Service: Open Heart Surgery;  Laterality: N/A;   TRANSCATHETER AORTIC VALVE REPLACEMENT, TRANSFEMORAL N/A 08/22/2017   Procedure: TRANSCATHETER AORTIC VALVE REPLACEMENT, TRANSFEMORAL;  Surgeon: Burnell Blanks, MD;  Location: [place];   Service: Open Heart Surgery;  Laterality: N/A;   UPPER GASTROINTESTINAL ENDOSCOPY     WHIPPLE PROCEDURE N/A 02/19/2018   Procedure: WHIPPLE PROCEDURE;  Surgeon: [name], MD;  Location: [place];  Service: General;  Laterality: N/A;       Family History  Problem Relation Age of Onset   Lupus Mother    CAD Brother    Hypertension Brother    Colon cancer Neg Hx    Esophageal cancer Neg Hx    Rectal cancer Neg Hx    Stomach cancer Neg Hx     Social History   Tobacco Use   Smoking status: Former    Packs/day: 1.00    Years: 57.00    Pack years: 57.00    Types: Cigarettes    Start date: 09/12/1958    Quit date: 04/12/2016    Years since quitting: 4.8   Smokeless tobacco: Never  Vaping Use   Vaping Use: Never used  Substance Use Topics   Alcohol use: No   Drug use: No    Home Medications Prior to Admission medications   Medication Sig Start Date End Date Taking? Authorizing Provider  atorvastatin (LIPITOR) 20 MG tablet TAKE 1 TABLET BY MOUTH ONCE DAILY. 04/17/20   Satira Sark, MD  clopidogrel (PLAVIX) 75 MG tablet TAKE 1 TABLET BY MOUTH ONCE DAILY WITH BREAKFAST. 05/20/20   Satira Sark, MD  CREON 36000 units CPEP capsule TAKE 1 CAPSULE BY MOUTH BEFORE EACH MEAL AND SNACK. TAKE UP TO 6 CAPSULES PER DAY. 04/09/19   Ladell Pier, MD  ferrous sulfate 325 (65 FE) MG tablet Take 325 mg by mouth daily with breakfast.    [provider]  insulin glargine (LANTUS SOLOSTAR) 100 UNIT/ML Solostar Pen Inject 11 Units into the skin daily. 12/04/20   Philemon Kingdom, MD  insulin lispro (HUMALOG KWIKPEN) 100 UNIT/ML KwikPen Inject 5-8 Units into the skin 3 (three) times daily before meals. Inject 5-8 units 3 times a day with meals. 12/04/20   Philemon Kingdom, MD  Insulin Pen Needle (PEN NEEDLES) 31G X 6 MM MISC Use as directed to check blood sugar 4 times a day. 11/10/20   Philemon Kingdom, MD  losartan (COZAAR) 25 MG tablet Take 0.5 tablets (12.5 mg total) by mouth  daily. 02/18/21   Strader, Fransisco Hertz, PA-C  nitroGLYCERIN (NITROSTAT) 0.4 MG SL tablet Place 1 tablet (0.4 mg total) under the tongue every 5 (five) minutes x 3 doses as needed for chest pain (if no relief after 2nd dose, proceed to the ED for an evaluation or call 911). 01/15/21   Satira Sark, MD  pantoprazole (PROTONIX) 40 MG tablet Take 1 tablet (40 mg total) by mouth 2 (two) times daily. 11/05/20   [name], MD    Allergies    Patient has no known allergies.  Review of Systems   Review of Systems  Constitutional:  Positive for activity change, appetite change and fatigue. Negative for chills and fever.  HENT:  Positive for hearing loss. Negative for ear pain and sore throat.   Eyes:  Negative for pain and visual disturbance.  Respiratory:  Positive for cough. Negative for shortness of breath.   Cardiovascular:  Negative for chest pain and palpitations.  Gastrointestinal:  Negative for abdominal pain and vomiting.  Genitourinary:  Negative for dysuria and hematuria.  Musculoskeletal:  Negative for arthralgias and back pain.  Skin:  Negative for color change and rash.  Neurological:  Negative for seizures and syncope.  Psychiatric/Behavioral:  Positive for confusion.   All other systems reviewed and are negative.  Physical Exam Updated Vital Signs BP 130/80 (BP Location: Right Arm)   Pulse 90   Temp 98.2 F (36.8 C)   Resp (!) 22   SpO2 100%   Physical Exam Vitals and nursing note reviewed.  Constitutional:      General: He is not in acute distress.    Appearance: Normal appearance. He is well-developed.  HENT:     Head: Normocephalic and atraumatic.     Mouth/Throat:     Mouth: Mucous membranes are dry.  Eyes:     Extraocular Movements: Extraocular movements intact.     Conjunctiva/sclera: Conjunctivae normal.     Pupils: Pupils are equal, round, and reactive to light.  Cardiovascular:     Rate and Rhythm: Normal rate and regular rhythm.     Heart sounds:  No murmur heard. Pulmonary:     Effort:  Pulmonary effort is normal. No respiratory distress.     Breath sounds: Normal breath sounds.  Abdominal:     Palpations: Abdomen is soft.     Tenderness: There is no abdominal tenderness.  Musculoskeletal:        General: No swelling. Normal range of motion.     Cervical back: Neck supple. No rigidity.  Skin:    General: Skin is warm and dry.     Capillary Refill: Capillary refill takes less than 2 seconds.  Neurological:     General: No focal deficit present.     Mental Status: He is alert. He is disoriented.     Cranial Nerves: No cranial nerve deficit.     Sensory: No sensory deficit.     Motor: No weakness.     Comments: Although patient is hard of hearing.  He will follow commands.  Seems to be a little bit confused and may be a little disoriented.  But no significant upper extremity or lower extremity focal motor weakness.  No pronator drift.  Cranial nerves seem to be intact other than is hard of hearing.  Speech seems normal.  But does seem confused.    ED Results / Procedures / Treatments   Labs (all labs ordered are listed, but only abnormal results are displayed) Labs Reviewed  COMPREHENSIVE METABOLIC PANEL  CBC WITH DIFFERENTIAL/PLATELET  LIPASE, BLOOD  URINALYSIS, ROUTINE W REFLEX MICROSCOPIC  LACTIC ACID, PLASMA  LACTIC ACID, PLASMA    EKG None  Radiology No results found.  Procedures Procedures   CRITICAL CARE Performed by: Fredia Sorrow Total critical care time: 35 minutes Critical care time was exclusive of separately billable procedures and treating other patients. Critical care was necessary to treat or prevent imminent or life-threatening deterioration. Critical care was time spent personally by me on the following activities: development of treatment plan with patient and/or surrogate as well as nursing, discussions with consultants, evaluation of patient's response to treatment, examination of patient,  obtaining history from patient or surrogate, ordering and performing treatments and interventions, ordering and review of laboratory studies, ordering and review of radiographic studies, pulse oximetry and re-evaluation of patient's condition.   Medications Ordered in ED Medications  0.9 %  sodium chloride infusion (has no administration in time range)  sodium chloride 0.9 % bolus 1,000 mL (has no administration in time range)  0.9 %  sodium chloride infusion (has no administration in time range)    ED Course  I have reviewed the triage vital signs and the nursing notes.  Pertinent labs & imaging results that were available during my care of the patient were reviewed by me and considered in my medical decision making (see chart for details).    MDM Rules/Calculators/A&P                          Patient's work-up here clinically appears dehydrated.  Urine was concentrated but not a infected.  Second lactic acid was elevated a little bit or borderline at 1.9.  But less than 2.  Chest x-ray without evidence of pneumonia.  Patient's sodium here was 125.  This is down significantly from from early June.  On June 13 it was 127 he received IV fluids then.  His creatinine is also a little bit elevated compared to June 13 and certainly compared to the beginning of June.  Head CT without any acute findings.  Electrolytes otherwise without significant abnormalities.  Discussed with hospitalist  on-call recommending IV fluids.  He did receive 1 L of fluid here and continue that overnight.  And then recheck his electrolytes in the morning.  They agree with plan and will admit the patient.  COVID testing not needed because he had a positive test on June 13.  And without any hypoxia.  No respiratory distress. Final Clinical Impression(s) / ED Diagnoses Final diagnoses:  None    Rx / DC Orders ED Discharge Orders     None        Fredia Sorrow, MD 03/04/21 2310

## 2021-03-04 NOTE — ED Notes (Signed)
This RN spoke with pt placement about bed being taken away. Pt placement to check if pt is going to [facility] and told this RN to call back in a few minutes

## 2021-03-04 NOTE — ED Notes (Signed)
Pt to CT

## 2021-03-04 NOTE — ED Triage Notes (Signed)
Pt hard of hearing, checked in with weakness. Pt BP 80's in triage. Dx withcovid 10 days ago.

## 2021-03-04 NOTE — ED Provider Notes (Signed)
Emergency Medicine Provider Triage Evaluation Note  Manuel Olson , a 75 y.o. male  was evaluated in triage.  Pt complains of weakness.  He was diagnosed with COVID 10 days ago. Patient is unable to provide history.  He is extremely hard of hearing.  Review of Systems  Positive: Weak, fatigue Negative: Syncope  Physical Exam  There were no vitals taken for this visit. Gen:   Somnolent, awakens when spoken to. Resp:  Normal effort  MSK:   Moves extremities without difficulty  Other:  Patient is extremely hard of hearing limiting evaluation or perform full ROS  Medical Decision Making  Medically screening exam initiated at 7:21 PM.  Appropriate orders placed.  Manuel Olson was informed that the remainder of the evaluation will be completed by another provider, this initial triage assessment does not replace that evaluation, and the importance of remaining in the ED until their evaluation is complete.  Patient hypotensive.  RN will let charge know.    Manuel Olson 03/04/21 2125    Manuel Dusky, MD 03/05/21 [phone]

## 2021-03-04 NOTE — ED Notes (Signed)
This RN spoke with pt placement who said that pt could go up to [place]

## 2021-03-05 DIAGNOSIS — I25119 Atherosclerotic heart disease of native coronary artery with unspecified angina pectoris: Secondary | ICD-10-CM

## 2021-03-05 DIAGNOSIS — N1832 Chronic kidney disease, stage 3b: Secondary | ICD-10-CM | POA: Diagnosis present

## 2021-03-05 DIAGNOSIS — R410 Disorientation, unspecified: Secondary | ICD-10-CM

## 2021-03-05 DIAGNOSIS — I252 Old myocardial infarction: Secondary | ICD-10-CM | POA: Diagnosis not present

## 2021-03-05 DIAGNOSIS — Z8616 Personal history of COVID-19: Secondary | ICD-10-CM | POA: Diagnosis not present

## 2021-03-05 DIAGNOSIS — N179 Acute kidney failure, unspecified: Secondary | ICD-10-CM | POA: Diagnosis present

## 2021-03-05 DIAGNOSIS — Z8673 Personal history of transient ischemic attack (TIA), and cerebral infarction without residual deficits: Secondary | ICD-10-CM | POA: Diagnosis not present

## 2021-03-05 DIAGNOSIS — E871 Hypo-osmolality and hyponatremia: Secondary | ICD-10-CM | POA: Diagnosis present

## 2021-03-05 DIAGNOSIS — L89152 Pressure ulcer of sacral region, stage 2: Secondary | ICD-10-CM | POA: Diagnosis present

## 2021-03-05 DIAGNOSIS — Z794 Long term (current) use of insulin: Secondary | ICD-10-CM | POA: Diagnosis not present

## 2021-03-05 DIAGNOSIS — R531 Weakness: Secondary | ICD-10-CM | POA: Diagnosis not present

## 2021-03-05 DIAGNOSIS — I130 Hypertensive heart and chronic kidney disease with heart failure and stage 1 through stage 4 chronic kidney disease, or unspecified chronic kidney disease: Secondary | ICD-10-CM | POA: Diagnosis present

## 2021-03-05 DIAGNOSIS — U071 COVID-19: Secondary | ICD-10-CM | POA: Diagnosis present

## 2021-03-05 DIAGNOSIS — Z952 Presence of prosthetic heart valve: Secondary | ICD-10-CM | POA: Diagnosis not present

## 2021-03-05 DIAGNOSIS — K219 Gastro-esophageal reflux disease without esophagitis: Secondary | ICD-10-CM | POA: Diagnosis present

## 2021-03-05 DIAGNOSIS — E785 Hyperlipidemia, unspecified: Secondary | ICD-10-CM | POA: Diagnosis present

## 2021-03-05 DIAGNOSIS — Z90411 Acquired partial absence of pancreas: Secondary | ICD-10-CM | POA: Diagnosis not present

## 2021-03-05 DIAGNOSIS — D509 Iron deficiency anemia, unspecified: Secondary | ICD-10-CM | POA: Diagnosis present

## 2021-03-05 DIAGNOSIS — G9341 Metabolic encephalopathy: Secondary | ICD-10-CM | POA: Diagnosis present

## 2021-03-05 DIAGNOSIS — E860 Dehydration: Secondary | ICD-10-CM | POA: Diagnosis present

## 2021-03-05 DIAGNOSIS — E872 Acidosis: Secondary | ICD-10-CM | POA: Diagnosis present

## 2021-03-05 DIAGNOSIS — I2510 Atherosclerotic heart disease of native coronary artery without angina pectoris: Secondary | ICD-10-CM | POA: Diagnosis present

## 2021-03-05 DIAGNOSIS — K529 Noninfective gastroenteritis and colitis, unspecified: Secondary | ICD-10-CM | POA: Diagnosis present

## 2021-03-05 DIAGNOSIS — Z8507 Personal history of malignant neoplasm of pancreas: Secondary | ICD-10-CM | POA: Diagnosis not present

## 2021-03-05 DIAGNOSIS — E1122 Type 2 diabetes mellitus with diabetic chronic kidney disease: Secondary | ICD-10-CM | POA: Diagnosis present

## 2021-03-05 DIAGNOSIS — Z832 Family history of diseases of the blood and blood-forming organs and certain disorders involving the immune mechanism: Secondary | ICD-10-CM | POA: Diagnosis not present

## 2021-03-05 DIAGNOSIS — Z85528 Personal history of other malignant neoplasm of kidney: Secondary | ICD-10-CM | POA: Diagnosis not present

## 2021-03-05 DIAGNOSIS — Z8249 Family history of ischemic heart disease and other diseases of the circulatory system: Secondary | ICD-10-CM | POA: Diagnosis not present

## 2021-03-05 LAB — BASIC METABOLIC PANEL
Anion gap: 8 (ref 5–15)
Anion gap: 8 (ref 5–15)
BUN: 22 mg/dL (ref 8–23)
BUN: 21 mg/dL (ref 8–23)
CO2: 20 mmol/L — ABNORMAL LOW (ref 22–32)
CO2: 19 mmol/L — ABNORMAL LOW (ref 22–32)
Calcium: 8.2 mg/dL — ABNORMAL LOW (ref 8.9–10.3)
Calcium: 8.2 mg/dL — ABNORMAL LOW (ref 8.9–10.3)
Chloride: 100 mmol/L (ref 98–111)
Chloride: 101 mmol/L (ref 98–111)
Creatinine, Ser: 1.56 mg/dL — ABNORMAL HIGH (ref 0.61–1.24)
Creatinine, Ser: 1.46 mg/dL — ABNORMAL HIGH (ref 0.61–1.24)
GFR, Estimated: 45 mL/min — ABNORMAL LOW (ref >60)
GFR, Estimated: 49 mL/min — ABNORMAL LOW (ref >60)
Glucose, Bld: 130 mg/dL — ABNORMAL HIGH (ref 70–99)
Glucose, Bld: 123 mg/dL — ABNORMAL HIGH (ref 70–99)
Potassium: 4.4 mmol/L (ref 3.5–5.1)
Potassium: 4.4 mmol/L (ref 3.5–5.1)
Sodium: 128 mmol/L — ABNORMAL LOW (ref 135–145)
Sodium: 128 mmol/L — ABNORMAL LOW (ref 135–145)

## 2021-03-05 LAB — TROPONIN I (HIGH SENSITIVITY)
Troponin I (High Sensitivity): 27 ng/L — ABNORMAL HIGH (ref <18)
Troponin I (High Sensitivity): 23 ng/L — ABNORMAL HIGH (ref <18)

## 2021-03-05 LAB — HEMOGLOBIN A1C
Hgb A1c MFr Bld: 7.2 % — ABNORMAL HIGH (ref 4.8–5.6)
Mean Plasma Glucose: 159.94 mg/dL

## 2021-03-05 LAB — GLUCOSE, CAPILLARY
Glucose-Capillary: 131 mg/dL — ABNORMAL HIGH (ref 70–99)
Glucose-Capillary: 118 mg/dL — ABNORMAL HIGH (ref 70–99)

## 2021-03-05 LAB — SODIUM, URINE, RANDOM: Sodium, Ur: 70 mmol/L

## 2021-03-05 LAB — OSMOLALITY, URINE: Osmolality, Ur: 534 mOsm/kg (ref 300–900)

## 2021-03-05 LAB — OSMOLALITY: Osmolality: 274 mOsm/kg — ABNORMAL LOW (ref 275–295)

## 2021-03-05 LAB — TSH: TSH: 1.132 u[IU]/mL (ref 0.350–4.500)

## 2021-03-05 LAB — CORTISOL: Cortisol, Plasma: 12.9 ug/dL

## 2021-03-05 MED ORDER — INSULIN ASPART 100 UNIT/ML IJ SOLN: 0.0000 [IU] | Freq: Every day | INTRAMUSCULAR | Status: DC

## 2021-03-05 MED ORDER — INSULIN ASPART 100 UNIT/ML IJ SOLN
0.0000 [IU] | Freq: Three times a day (TID) | INTRAMUSCULAR | Status: DC
  Administered 2021-03-05: 2 [IU] via SUBCUTANEOUS
  Administered 2021-03-07: 3 [IU] via SUBCUTANEOUS

## 2021-03-05 NOTE — Plan of Care (Signed)
  Problem: Clinical Measurements: Goal: Ability to maintain clinical measurements within normal limits will improve Outcome: Progressing Goal: Will remain free from infection Outcome: Progressing Goal: Diagnostic test results will improve Outcome: Progressing Goal: Respiratory complications will improve Outcome: Progressing Goal: Cardiovascular complication will be avoided Outcome: Progressing   Problem: Activity: Goal: Risk for activity intolerance will decrease Outcome: Progressing   Problem: Nutrition: Goal: Adequate nutrition will be maintained Outcome: Progressing   Problem: Elimination: Goal: Will not experience complications related to bowel motility Outcome: Progressing Goal: Will not experience complications related to urinary retention Outcome: Progressing   Problem: Safety: Goal: Ability to remain free from injury will improve Outcome: Progressing

## 2021-03-05 NOTE — Evaluation (Signed)
Physical Therapy Evaluation Patient Details Name: Manuel Olson MRN: [id number] DOB: [birth year] Today's Date: 03/05/2021   History of Present Illness  75yo male admitted 03/04/21 with generalized weakness and poor appetite. Of note, tested positive for Covid in [facility] ED on 02/22/21 and was discharged home at that time. PMH back pain, CAD, HTN, CVA, HLD, MI, pancreatic CA s/p whipple procedure, TAVR, shoulder arthroscopy  Clinical Impression   Patient received in bed, pleasant and cooperative but quite HOH even with hearing aides in place. Did need a bit of help for bed mobility, but otherwise able to mobilize very well in the room without device without difficulty. VSS on room air, no O2 desat noted and no SOB. Left in bed with all needs met, bed alarm active. Family very anxious about his recovery and requesting SNF at DC, however really he seems to be close to if not at his baseline level of mobility. Question if he will truly need PT f/u at DC.     Follow Up Recommendations SNF;Other (comment) (per family request)    Equipment Recommendations  None recommended by PT    Recommendations for Other Services       Precautions / Restrictions Precautions Precautions: Fall;Other (comment) Precaution Comments: covid + Restrictions Weight Bearing Restrictions: No      Mobility  Bed Mobility Overal bed mobility: Needs Assistance Bed Mobility: Supine to Sit;Sit to Supine     Supine to sit: Min assist;HOB elevated Sit to supine: Supervision   General bed mobility comments: HOB elevated and use of rails getting to EOB, still needed MinA to boost up midline sitting at EOB; only needed S/assist for lines with return to supine    Transfers Overall transfer level: Needs assistance Equipment used: Rolling walker (2 wheeled);None Transfers: Sit to/from Stand Sit to Stand: Supervision         General transfer comment: S and cues for hand placement/safety with and without RW, no  physical assist given  Ambulation/Gait Ambulation/Gait assistance: Min guard Gait Distance (Feet): 60 Feet (53fx2) Assistive device: Rolling walker (2 wheeled);None Gait Pattern/deviations: Step-through pattern;WFL(Within Functional Limits);Narrow base of support Gait velocity: decreased   General Gait Details: gait pattern generally WNL with and without RW; able to make quick turns in the room without LOB and without difficulty. VSS on RA.  Stairs            Wheelchair Mobility    Modified Rankin (Stroke Patients Only)       Balance Overall balance assessment: Mild deficits observed, not formally tested                                           Pertinent Vitals/Pain Pain Assessment: 0-10 Pain Score: 5  Pain Location: chronic back pain Pain Descriptors / Indicators: Aching;Sore Pain Intervention(s): Limited activity within patient's tolerance;Monitored during session;Repositioned    Home Living Family/patient expects to be discharged to:: Private residence Living Arrangements: Spouse/significant other Available Help at Discharge: Family;Available 24 hours/day Type of Home: House Home Access: Stairs to enter   [employer] Steps: 3 unclear if there are rails Home Layout: Two level;Able to live on main level with bedroom/bathroom Home Equipment: Walker - 2 wheels      Prior Function Level of Independence: Independent with assistive device(s)         Comments: no falls or close calls  Hand Dominance        Extremity/Trunk Assessment   Upper Extremity Assessment Upper Extremity Assessment: Defer to OT evaluation    Lower Extremity Assessment Lower Extremity Assessment: Generalized weakness    Cervical / Trunk Assessment Cervical / Trunk Assessment: Kyphotic  Communication   Communication: HOH  Cognition Arousal/Alertness: Awake/alert Behavior During Therapy: WFL for tasks assessed/performed;Flat  affect Overall Cognitive Status: Impaired/Different from baseline Area of Impairment: Orientation;Memory;Safety/judgement                 Orientation Level: Disoriented to;Situation   Memory: Decreased short-term memory   Safety/Judgement: Decreased awareness of safety     General Comments: tells me he is here because his wife made him come and states "I never believed in this covid stuff". Seems to have STM deficits but unclear how close this is to baseline as he tells wife he told PT they have 17 cats and a wolf (joking), but did not make this joke to me earlier      General Comments      Exercises     Assessment/Plan    PT Assessment Patient needs continued PT services  PT Problem List Decreased strength;Decreased cognition;Decreased safety awareness       PT Treatment Interventions DME instruction;Balance training;Gait training;Stair training;Cognitive remediation;Functional mobility training;Patient/family education;Therapeutic activities;Therapeutic exercise    PT Goals (Current goals can be found in the Care Plan section)  Acute Rehab PT Goals Patient Stated Goal: go to rehab PT Goal Formulation: With patient/family Time For Goal Achievement: 03/19/21 Potential to Achieve Goals: Good    Frequency Min 3X/week   Barriers to discharge        Co-evaluation               AM-PAC PT "6 Clicks" Mobility  Outcome Measure Help needed turning from your back to your side while in a flat bed without using bedrails?: None Help needed moving from lying on your back to sitting on the side of a flat bed without using bedrails?: A Little Help needed moving to and from a bed to a chair (including a wheelchair)?: None Help needed standing up from a chair using your arms (e.g., wheelchair or bedside chair)?: None Help needed to walk in hospital room?: A Little Help needed climbing 3-5 steps with a railing? : A Little 6 Click Score: 21    End of Session    Activity Tolerance: Patient tolerated treatment well Patient left: in bed;with call bell/phone within reach;with bed alarm set;with family/visitor present Nurse Communication: Mobility status PT Visit Diagnosis: Unsteadiness on feet (R26.81);Muscle weakness (generalized) (M62.81)    Time: 1941-7408 PT Time Calculation (min) (ACUTE ONLY): 24 min   Charges:   PT Evaluation $PT Eval Moderate Complexity: 1 Mod PT Treatments $Gait Training: 8-22 mins       Windell Norfolk, DPT, PN1   Supplemental Physical Therapist [place]    Pager [phone] Acute Rehab Office [phone]

## 2021-03-05 NOTE — Evaluation (Signed)
Occupational Therapy Evaluation Patient Details Name: Manuel Olson MRN: [id number] DOB: April 15, 1946 Today's Date: 03/05/2021    History of Present Illness 75yo male admitted 03/04/21 with generalized weakness and poor appetite. Of note, tested positive for Covid in [facility] ED on 02/22/21 and was discharged home at that time. PMH back pain, CAD, HTN, CVA, HLD, MI, pancreatic CA s/p whipple procedure, TAVR, shoulder arthroscopy   Clinical Impression   Patient admitted for the diagnosis above.  PTA he lives with his spouse, who is able to assist as needed.  He was largely independent with ADL completion, and mobility in the home.  He has had a fairly extensive recent medical history that has left him feeling weak and fatigued impacting independence.  The patient, and his spouse are requesting short term rehab for aggressive post acute therapy to hopefully regain strength and improve his quality of life. Barriers are listed below. Currently he is needing up to [place] for basic mobility and ADL completion in a sit/stand level.  OT recommended in the acute setting to maximize his functional status.      Follow Up Recommendations  SNF    Equipment Recommendations  Other (comment) (1OAC)    Recommendations for Other Services       Precautions / Restrictions Precautions Precautions: Fall Precaution Comments: covid + Restrictions Weight Bearing Restrictions: No      Mobility Bed Mobility Overal bed mobility: Needs Assistance Bed Mobility: Supine to Sit     Supine to sit: Supervision Sit to supine: Supervision   General bed mobility comments: HOB elevated and use of rails getting to EOB, still needed MinA to boost up midline sitting at EOB; only needed S/assist for lines with return to supine Patient Response: Cooperative  Transfers Overall transfer level: Needs assistance Equipment used: 1 person hand held assist Transfers: Sit to/from [facility] Sit to Stand:  Supervision Stand pivot transfers: Min guard       General transfer comment: S and cues for hand placement/safety with and without RW, no physical assist given    Balance Overall balance assessment: Mild deficits observed, not formally tested                                         ADL either performed or assessed with clinical judgement   ADL Overall ADL's : Needs assistance/impaired Eating/Feeding: Independent;Sitting   Grooming: Wash/dry hands;Min guard;Standing           Upper Body Dressing : Minimal assistance;Standing Upper Body Dressing Details (indicate cue type and reason): balance support Lower Body Dressing: Min guard;Sit to/from stand   Toilet Transfer: Minimal Print production planner Details (indicate cue type and reason): HHA Toileting- Clothing Manipulation and Hygiene: Supervision/safety;Sitting/lateral lean       Functional mobility during ADLs: Minimal assistance General ADL Comments: +1 HHA     Vision Patient Visual Report: No change from baseline       Perception     Praxis      Pertinent Vitals/Pain Pain Assessment: No/denies pain Pain Intervention(s): Monitored during session     Hand Dominance Right   Extremity/Trunk Assessment Upper Extremity Assessment Upper Extremity Assessment: Generalized weakness   Lower Extremity Assessment Lower Extremity Assessment: Defer to PT evaluation   Cervical / Trunk Assessment Cervical / Trunk Assessment: Kyphotic   Communication Communication Communication: HOH   Cognition Arousal/Alertness: Awake/alert Behavior During Therapy: [facility]  for tasks assessed/performed Overall Cognitive Status: Impaired/Different from baseline Area of Impairment: Memory                     Memory: Decreased short-term memory         General Comments: Spouse states ST memory dieficts and chronic fatigue recently.   General Comments       Exercises     Shoulder Instructions       Home Living Family/patient expects to be discharged to:: Private residence Living Arrangements: Spouse/significant other Available Help at Discharge: Family;Available 24 hours/day Type of Home: House Home Access: Stairs to enter [employer] of Steps: 3   Home Layout: Two level;Able to live on main level with bedroom/bathroom     Bathroom Shower/Tub: Teacher, early years/pre: Standard Bathroom Accessibility: Yes How Accessible: Accessible via walker Home Equipment: [place] - 2 wheels;Shower seat          Prior Functioning/Environment          Comments: No assist with ADL/IADL at home.  Patient does not participate with meal prep or home management.  Spouse assist with community mobility.        OT Problem List: Decreased strength;Decreased activity tolerance;Impaired balance (sitting and/or standing)      OT Treatment/Interventions: Self-care/ADL training;Therapeutic exercise;Balance training;Therapeutic activities    OT Goals(Current goals can be found in the care plan section) Acute Rehab OT Goals Patient Stated Goal: Get a little stronger OT Goal Formulation: With patient Time For Goal Achievement: 03/19/21 Potential to Achieve Goals: Good ADL Goals Pt Will Perform Grooming: with set-up;standing Pt Will Perform Lower Body Bathing: with set-up;sit to/from stand Pt Will Perform Lower Body Dressing: with set-up;sit to/from stand Pt Will Transfer to Toilet: with modified independence;ambulating;regular height toilet Pt/caregiver will Perform Home Exercise Program: Increased strength;Both right and left upper extremity;With theraband;With Supervision;With written HEP provided  OT Frequency: Min 2X/week   Barriers to D/C:    none noted       Co-evaluation              AM-PAC OT "6 Clicks" Daily Activity     Outcome Measure Help from another person eating meals?: None Help from another person taking care of personal grooming?:  None Help from another person toileting, which includes using toliet, bedpan, or urinal?: A Little Help from another person bathing (including washing, rinsing, drying)?: A Little Help from another person to put on and taking off regular upper body clothing?: A Little Help from another person to put on and taking off regular lower body clothing?: A Little 6 Click Score: 20   End of Session Nurse Communication: Mobility status  Activity Tolerance: Patient tolerated treatment well Patient left: in chair;with call bell/phone within reach;with family/visitor present  OT Visit Diagnosis: Unsteadiness on feet (R26.81);Muscle weakness (generalized) (M62.81)                Time: 2878-6767 OT Time Calculation (min): 19 min Charges:  OT General Charges $OT Visit: 1 Visit OT Evaluation $OT Eval Moderate Complexity: 1 Mod  03/05/2021  Rich, OTR/L  Acute Rehabilitation Services  Office:  [place] 03/05/2021, 2:06 PM

## 2021-03-05 NOTE — Progress Notes (Addendum)
PROGRESS NOTE                                                                                                                                                                                                             Patient Demographics:    Manuel Olson, is a 75 y.o. male, DOB - [birth year], RKY:[id number]  Outpatient Primary MD for the patient is [name], MD   Admit date - 03/04/2021   LOS - 0  Chief Complaint  Patient presents with   Weakness       Brief Narrative: Patient is a 75 y.o. male with PMHx of chronic systolic heart failure, severe aortic stenosis-s/p TAVR 2018, history of pancreatic cancer-s/p Whipple's procedure, history of left renal cell carcinoma-s/p radical nephrectomy, CKD stage IIIb, IDDM-2, HTN, HLD-who was diagnosed with COVID-19 on 6/13-discharged on Paxlovid but never took it (due to concern about kidney issues)-brought in the hospital on 6/23 with worsening weakness, confusion-found to have hyponatremia-subsequently admitted to the hospitalist service.  See below for further details.   COVID-19 vaccinated status: Vaccinated including booster  Significant Events: 6/13>> COVID-19 positive-evaluated in ED-discharged on Paxlovid 6/23>> Admit to [facility] for weakness/confusion/hyponatremia.  Significant studies: 6/23>> CT head: No acute intracranial abnormality 6/23>> CXR: No pneumonia  COVID-19 medications: None  Antibiotics: None  Microbiology data: None  Procedures: None  Consults: None  DVT prophylaxis: enoxaparin (LOVENOX) injection 30 mg Start: 03/05/21 1000    Subjective:    Manual Meier today wants to leave the hospital-as he feels better-he seems pretty agitated that he needs to remain in the hospital.  Spouse at bedside still thinks he is weak and not at baseline.   Assessment  & Plan :   Hyponatremia: Likely due to dehydration/poor oral intake-sodium  levels have improved to 128 this morning with IV fluids-continue gentle hydration-repeat electrolytes tomorrow.  Encourage oral intake  Acute metabolic encephalopathy: Likely due to hyponatremia/dehydration/COVID 19 infection-seems to have improved.  CT head negative.    Generalized weakness: Due to recent COVID-19 infection-hyponatremia-continue PT/OT eval. TSH stable.  COVID-19 infection: Tested positive on 6/13-no pneumonia on this CXR-no hypoxia-doubt any benefit from Remdesivir/other therapies at this point.  Suspect does not require isolation as already beyond 10 days.  History of CAD: No anginal symptoms: Continue Plavix/statin  Chronic systolic heart failure (EF 45-50% by echo on  November 2021): Volume status stable  CKD stage IIIb-solitary kidney-history of left nephrectomy (hx of renal cancer): Creatinine at baseline-follow periodically.  History of pancreatic cancer-s/p Whipple's procedure  DM-2 (A1c 7.2 on 6/24): CBG stable on SSI.   ABG:    Component Value Date/Time   PHART 7.281 (L) 02/19/2018 1349   PCO2ART 51.1 (H) 02/19/2018 1349   PO2ART 225.0 (H) 02/19/2018 1349   HCO3 24.1 02/19/2018 1349   TCO2 19 (L) 12/30/2019 1937   ACIDBASEDEF 3.0 (H) 02/19/2018 1349   O2SAT 100.0 02/19/2018 1349    Vent Settings: N/A   Condition - stable  Family Communication  :  Spouse at bedside-she is aware that patient was adamantly asking for discharge this morning-and that if he could leave the hospital [place].  Code Status :  Full Code  Diet :  Diet Order             Diet heart healthy/carb modified Room service appropriate? Yes; Fluid consistency: Thin  Diet effective now                    Disposition Plan  :   Status is: Observation  The patient will require care spanning > 2 midnights and should be moved to inpatient because: Inpatient level of care appropriate due to severity of illness  Dispo: The patient is from: Home               Anticipated d/c is to: Home              Patient currently is not medically stable to d/c.   Difficult to place patient No    Barriers to discharge: Hyponatremia/confusion/generalized weakness-although improved-sodium levels are still at 128-and requires another day of inpatient monitoring and IV fluid administration.  Antimicorbials  :    Anti-infectives (From admission, onward)    None       Inpatient Medications  Scheduled Meds:  atorvastatin  20 mg Oral QHS   clopidogrel  75 mg Oral Q breakfast   enoxaparin (LOVENOX) injection  30 mg Subcutaneous Q24H   ferrous sulfate  650 mg Oral Q breakfast   insulin aspart  0-5 Units Subcutaneous QHS   insulin aspart  0-9 Units Subcutaneous TID WC   lipase/protease/amylase  72,000 Units Oral TID WC   megestrol  200 mg Oral TID AC   pantoprazole  40 mg Oral BID   Continuous Infusions:  sodium chloride 75 mL/hr at 03/05/21 1000   PRN Meds:.acetaminophen **OR** acetaminophen   Time Spent in minutes 35  See all Orders from today for further details   Oren Binet M.D on 03/05/2021 at 11:36 AM  To page go to [email or web address] - use universal password  [facility] -  Office  [phone]    Objective:   Vitals:   03/04/21 2319 03/05/21 0055 03/05/21 0441 03/05/21 0812  BP:  138/76 (!) 148/84 (!) 151/81  Pulse: 74 73 65 72  Resp: 15 20 14 17   Temp:  98.1 F (36.7 C) 98.8 F (37.1 C) 98.2 F (36.8 C)  TempSrc:  Oral Oral Oral  SpO2: 99% 99% 99% 96%    Wt Readings from Last 3 Encounters:  02/22/21 55.3 kg  02/18/21 55.5 kg  02/16/21 56.8 kg     Intake/Output Summary (Last 24 hours) at 03/05/2021 1136 Last data filed at 03/05/2021 0815 Gross per 24 hour  Intake --  Output 575 ml  Net -575 ml     Physical  Exam Gen Exam:Alert awake-not in any distress HEENT:atraumatic, normocephalic Chest: B/L clear to auscultation anteriorly CVS:S1S2 regular Abdomen:soft non tender, non distended Extremities:no  edema Neurology: Non focal Skin: no rash   Data Review:    CBC Recent Labs  Lab 03/04/21 1924  WBC 10.2  HGB 11.9*  HCT 35.3*  PLT 105*  MCV 92.4  MCH 31.2  MCHC 33.7  RDW 13.7  LYMPHSABS 0.3*  MONOABS 1.3*  EOSABS 0.0  BASOSABS 0.0    Chemistries  Recent Labs  Lab 03/04/21 1924 03/05/21 0037 03/05/21 0140  NA 125* 128* 128*  K 4.3 4.4 4.4  CL 97* 100 101  CO2 20* 20* 19*  GLUCOSE 151* 130* 123*  BUN 22 22 21   CREATININE 1.62* 1.56* 1.46*  CALCIUM 8.7* 8.2* 8.2*  AST 22  --   --   ALT 19  --   --   ALKPHOS 89  --   --   BILITOT 0.5  --   --    ------------------------------------------------------------------------------------------------------------------ No results for input(s): CHOL, HDL, LDLCALC, TRIG, CHOLHDL, LDLDIRECT in the last 72 hours.  Lab Results  Component Value Date   HGBA1C 7.2 (H) 03/05/2021   ------------------------------------------------------------------------------------------------------------------ Recent Labs    03/05/21 0037  TSH 1.132   ------------------------------------------------------------------------------------------------------------------ No results for input(s): VITAMINB12, FOLATE, FERRITIN, TIBC, IRON, RETICCTPCT in the last 72 hours.  Coagulation profile No results for input(s): INR, PROTIME in the last 168 hours.  No results for input(s): DDIMER in the last 72 hours.  Cardiac Enzymes No results for input(s): CKMB, TROPONINI, MYOGLOBIN in the last 168 hours.  Invalid input(s): CK ------------------------------------------------------------------------------------------------------------------    Component Value Date/Time   BNP 163.2 (H) 08/17/2017 4765    Micro Results No results found for this or any previous visit (from the past 240 hour(s)).  Radiology Reports DG Chest 2 View  Result Date: 02/22/2021 CLINICAL DATA:  Cough and weakness for 1 week EXAM: CHEST - 2 VIEW COMPARISON:  08/10/2020  FINDINGS: Cardiac shadow is within normal limits. Findings are noted. Aortic calcifications are seen and stable. The lungs are hyperinflated without focal infiltrate or effusion. Calcified granuloma is again seen in the right mid lung. Degenerative changes of the thoracic spine. IMPRESSION: COPD without acute abnormality. Electronically Signed   By: Inez Catalina M.D.   On: 02/22/2021 08:22   CT Head Wo Contrast  Result Date: 03/04/2021 CLINICAL DATA:  Altered mental status EXAM: CT HEAD WITHOUT CONTRAST TECHNIQUE: Contiguous axial images were obtained from the base of the skull through the vertex without intravenous contrast. COMPARISON:  MRI 07/29/2017 FINDINGS: Brain: Normal anatomic configuration. Parenchymal volume loss is commensurate with the patient's age. Moderate periventricular white matter changes are present likely reflecting the sequela of small vessel ischemia, stable since prior examination. 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 No abnormal intra or extra-axial mass lesion or fluid collection. No abnormal mass effect or midline shift. No evidence of acute intracranial hemorrhage or infarct. Ventricular size is normal. Cerebellum unremarkable. Vascular: No asymmetric hyperdense vasculature at the skull base. Skull: Intact Sinuses/Orbits: Paranasal sinuses are clear. Orbits are unremarkable. Other: Mastoid air cells and middle ear cavities are clear. IMPRESSION: No evidence of acute intracranial hemorrhage or infarct. Stable moderate senescent change. 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 Electronically Signed   By: Fidela Salisbury MD   On: 03/04/2021 21:38   DG Chest Port 1 View  Result Date: 03/04/2021 CLINICAL DATA:  History of COVID, initial encounter EXAM:  PORTABLE CHEST 1 VIEW COMPARISON:  02/22/2021 FINDINGS: None cardiac shadow is stable. Changes of prior TAVR are seen. Aortic calcifications are again noted. The lungs are hyperinflated but clear.  Stable granuloma is noted on the right. No acute bony abnormality noted. IMPRESSION: COPD without acute abnormality. Electronically Signed   By: Inez Catalina M.D.   On: 03/04/2021 20:17   DG Abd 2 Views  Result Date: 02/17/2021 CLINICAL DATA:  Abdominal pain EXAM: ABDOMEN - 2 VIEW COMPARISON:  CT abdomen pelvis 10/15/2020 FINDINGS: Prosthetic aortic valve is noted. Right lower lobe pulmonary nodule again seen and unchanged compared to CT from 12/25/2017. Severe is a benign etiology. No dilated loops of bowel to indicate ileus or obstruction. Degenerative changes seen throughout the lumbar spine. Right inguinal hernia mesh is noted. IMPRESSION: Nonobstructive nonspecific bowel gas pattern. Electronically Signed   By: Miachel Roux M.D.   On: 02/17/2021 13:23

## 2021-03-06 LAB — BASIC METABOLIC PANEL
Anion gap: 7 (ref 5–15)
BUN: 16 mg/dL (ref 8–23)
CO2: 17 mmol/L — ABNORMAL LOW (ref 22–32)
Calcium: 8.3 mg/dL — ABNORMAL LOW (ref 8.9–10.3)
Chloride: 102 mmol/L (ref 98–111)
Creatinine, Ser: 1.24 mg/dL (ref 0.61–1.24)
GFR, Estimated: 60 mL/min — ABNORMAL LOW (ref >60)
Glucose, Bld: 127 mg/dL — ABNORMAL HIGH (ref 70–99)
Potassium: 3.9 mmol/L (ref 3.5–5.1)
Sodium: 126 mmol/L — ABNORMAL LOW (ref 135–145)

## 2021-03-06 MED ORDER — DIPHENOXYLATE-ATROPINE 2.5-0.025 MG PO TABS
1.0000 | ORAL_TABLET | Freq: Four times a day (QID) | ORAL | Status: DC | PRN
  Administered 2021-03-06 – 2021-03-08 (×5): 1 via ORAL
  Filled 2021-03-06 (×6): qty 1

## 2021-03-06 MED ORDER — SODIUM CHLORIDE 1 G PO TABS
1.0000 g | ORAL_TABLET | Freq: Three times a day (TID) | ORAL | Status: DC
  Administered 2021-03-06 – 2021-03-08 (×6): 1 g via ORAL
  Filled 2021-03-06 (×8): qty 1

## 2021-03-06 MED ORDER — SODIUM BICARBONATE 650 MG PO TABS
650.0000 mg | ORAL_TABLET | Freq: Three times a day (TID) | ORAL | Status: DC
  Administered 2021-03-06 – 2021-03-08 (×7): 650 mg via ORAL
  Filled 2021-03-06 (×7): qty 1

## 2021-03-06 NOTE — Progress Notes (Signed)
PROGRESS NOTE    EINAR NOLASCO  WEX:[id number] DOB: 11/09/1945 DOA: 03/04/2021 PCP: [name], MD    Brief Narrative:  Mr. Candelas was admitted to with the working diagnosis of acute metabolic encephalopathy, hyponatremia in the setting of SARS COVID 19 infection.   75 year old past medical history of coronary artery disease, aortic stenosis sp TAVR 9381, chronic diastolic heart failure, history of pancreatic cancer status post Whipple's procedure and chemotherapy, history of renal cell cancer, chronic kidney disease stage III, type 2 diabetes mellitus, hypertension and dyslipidemia. On 02/22/2021 tested positive for COVID-19, he was evaluated in the ED and discharged on Paxlovid.  He has been vaccinated and boosted for COVID-19.   Unfortunately did not took antiviral therapy, because pills were to big to swallow and returned to the ED 10 days later.  He reported generalized weakness, confusion and poor appetite.  On his initial physical examination his blood pressure was 110/7076, respiratory rate 13, temperature 98.6, oxygen saturation 97%, he had dry mucous membranes, lungs clear to auscultation, heart S1-S2, present, rhythmic, soft abdomen, no lower extremity edema.  Sodium 128, potassium 4.4, chloride 100, bicarb 20, glucose 130, BUN 22, creatinine 1.56, serum osmolarity 274, white count 10.2, hemoglobin 11.5, hematocrit 35.8, platelets 105.   UA with specific gravity 1.021, 30 protein.  Head CT no acute changes.  Chest radiograph with hyperinflation, no infiltrates.  EKG 86 bpm, normal axis, normal intervals, sinus rhythm, small q wave lead II, lead III, aVF, no ST segment changes, negative T wave lead II, lead III, aVF.   Patient has been placed on supportive medical therapy including intravenous fluids with close neurologic monitoring.   Assessment & Plan:   Principal Problem:   COVID-19 Active Problems:   CAD (coronary artery disease)   Hyponatremia    Generalized weakness   Confusion   COVID-19 virus infection   Acute metabolic encephalopathy, in the setting of recent SARS COVID 19 infection.  This am patient is feeling better but not yet back to baseline, no nausea or vomiting. No confusion but continue to be very weak and deconditioned.  Continue neuro checks per unit protocol. Follow with PT, OT and nutrition recommendations. Out of bed to chair TID with meals.  Today is 15 days post initial positive COVID 19 PCR, patient is afebrile with no respiratory symptoms. Not candidate at this point for steroids or further antiviral therapy.   Will discontinue airborne isolation.  Patient may need SNF at discharge.   2. 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 Non anion gap metabolic acidosis.  Patient with worsening hyponatremia, despite normal saline infusion. Clinically more euvolemic.  His urinary osmolality is 534 with urinary Na at 70, consistent with SIADH. Renal function with serum cr down to 1,24 with K at 3,9 and serum bicarbonate at 17 and CL 102.  Plan to discontinue IV fluids, will add NaCl tablets and oral sodium bicarbonate. Follow up renal function and electrolytes in am, avoid hypotension and nephrotoxic medications.  Liberate diet to regular with no restrictions.   3. CAD, dyslipidemia, HTN, systolic heart failure. No signs of decompensation, continue blood pressure monitoring   Continue with statin therapy and clopidogrel   4. T2DM fasting glucose this am at 127 mg/dl, patient is tolerating po well.  Continue to hold on basal insulin for now.   5. Hx od pancreatic cancer sp Whipple procedure./. iron deficiency anemia Patient very weak and deconditioned. Continue supportive medical care.  Continue iron supplementation.    Patient continue to be at high risk for worsening hyponatremia   Status is: Inpatient  Remains inpatient appropriate because:Inpatient level of  care appropriate due to severity of illness  Dispo: The patient is from: Home              Anticipated d/c is to: SNF              Patient currently is not medically stable to d/c.   Difficult to place patient No    DVT prophylaxis: Enoxaparin   Code Status:   Full  Family Communication:  I spoke with patient's wife at the bedside, we talked in detail about patient's condition, plan of care and prognosis and all questions were addressed.    Subjective: Patient is feeling better, but not yet back to baseline, no nausea or vomiting, no dyspnea or chest pain.   Objective: Vitals:   03/06/21 0100 03/06/21 0400 03/06/21 0529 03/06/21 0814  BP: (!) 167/88 (!) 177/87 (!) 169/95 (!) 148/89  Pulse: 67 68 69 76  Resp: 14 16 16 15   Temp: 98.2 F (36.8 C) 98.1 F (36.7 C) 98.1 F (36.7 C) (!) 97.5 F (36.4 C)  TempSrc: Oral Oral Axillary Oral  SpO2: 96% 95% 97% 93%    Intake/Output Summary (Last 24 hours) at 03/06/2021 0844 Last data filed at 03/06/2021 0600 Gross per 24 hour  Intake 2348.55 ml  Output 1600 ml  Net 748.55 ml   There were no vitals filed for this visit.  Examination:   General: Not in pain or dyspnea, deconditioned and ill looking appearing  Neurology: Awake and alert, non focal  E ENT: positive pallor, no icterus, oral mucosa moist Cardiovascular: No JVD. S1-S2 present, rhythmic, no gallops, rubs, or murmurs. No lower extremity edema. Pulmonary: positive breath sounds bilaterally, adequate air movement, no wheezing, rhonchi or rales. Gastrointestinal. Abdomen soft and non tender Skin. No rashes Musculoskeletal: no joint deformities     Data Reviewed: I have personally reviewed following labs and imaging studies  CBC: Recent Labs  Lab 03/04/21 1924  WBC 10.2  NEUTROABS 8.5*  HGB 11.9*  HCT 35.3*  MCV 92.4  PLT 599*   Basic Metabolic Panel: Recent Labs  Lab 03/04/21 1924 03/05/21 0037 03/05/21 0140 03/06/21 0113  NA 125* 128* 128* 126*  K  4.3 4.4 4.4 3.9  CL 97* 100 101 102  CO2 20* 20* 19* 17*  GLUCOSE 151* 130* 123* 127*  BUN 22 22 21 16   CREATININE 1.62* 1.56* 1.46* 1.24  CALCIUM 8.7* 8.2* 8.2* 8.3*   GFR: Estimated Creatinine Clearance: 39 mL/min (by C-G formula based on SCr of 1.24 mg/dL). Liver Function Tests: Recent Labs  Lab 03/04/21 1924  AST 22  ALT 19  ALKPHOS 89  BILITOT 0.5  PROT 6.1*  ALBUMIN 3.0*   Recent Labs  Lab 03/04/21 1924  LIPASE 15   No results for input(s): AMMONIA in the last 168 hours. Coagulation Profile: No results for input(s): INR, PROTIME in the last 168 hours. Cardiac Enzymes: No results for input(s): CKTOTAL, CKMB, CKMBINDEX, TROPONINI in the last 168 hours. BNP (last 3 results) No results for input(s): PROBNP in the last 8760 hours. HbA1C: Recent Labs    03/05/21 0037  HGBA1C 7.2*   CBG: Recent Labs  Lab 03/05/21 0102 03/05/21 0811  GLUCAP 131* 118*   Lipid Profile: No results for input(s): CHOL, HDL, LDLCALC, TRIG, CHOLHDL, LDLDIRECT in the last 72 hours. Thyroid Function  Tests: Recent Labs    03/05/21 0037  TSH 1.132   Anemia Panel: No results for input(s): VITAMINB12, FOLATE, FERRITIN, TIBC, IRON, RETICCTPCT in the last 72 hours.    Radiology Studies: I have reviewed all of the imaging during this hospital visit personally     Scheduled Meds:  atorvastatin  20 mg Oral QHS   clopidogrel  75 mg Oral Q breakfast   enoxaparin (LOVENOX) injection  30 mg Subcutaneous Q24H   ferrous sulfate  650 mg Oral Q breakfast   insulin aspart  0-5 Units Subcutaneous QHS   insulin aspart  0-9 Units Subcutaneous TID WC   lipase/protease/amylase  72,000 Units Oral TID WC   megestrol  200 mg Oral TID AC   pantoprazole  40 mg Oral BID   Continuous Infusions:  sodium chloride 75 mL/hr at 03/06/21 0400     LOS: 1 day        Lanaiya Lantry Gerome Apley, MD

## 2021-03-07 DIAGNOSIS — L8990 Pressure ulcer of unspecified site, unspecified stage: Secondary | ICD-10-CM | POA: Insufficient documentation

## 2021-03-07 LAB — BASIC METABOLIC PANEL
Anion gap: 6 (ref 5–15)
BUN: 20 mg/dL (ref 8–23)
CO2: 19 mmol/L — ABNORMAL LOW (ref 22–32)
Calcium: 8.6 mg/dL — ABNORMAL LOW (ref 8.9–10.3)
Chloride: 105 mmol/L (ref 98–111)
Creatinine, Ser: 1.3 mg/dL — ABNORMAL HIGH (ref 0.61–1.24)
GFR, Estimated: 57 mL/min — ABNORMAL LOW (ref >60)
Glucose, Bld: 131 mg/dL — ABNORMAL HIGH (ref 70–99)
Potassium: 3.9 mmol/L (ref 3.5–5.1)
Sodium: 130 mmol/L — ABNORMAL LOW (ref 135–145)

## 2021-03-07 LAB — GLUCOSE, CAPILLARY: Glucose-Capillary: 107 mg/dL — ABNORMAL HIGH (ref 70–99)

## 2021-03-07 NOTE — Progress Notes (Signed)
PROGRESS NOTE    Manuel Olson  GHW:[id number] DOB: 03/07/1946 DOA: 03/04/2021 PCP: [name], MD    Brief Narrative:  Manuel Olson was admitted to with the working diagnosis of acute metabolic encephalopathy, hyponatremia in the setting of SARS COVID 19 infection.   75 year old past medical history of coronary artery disease, aortic stenosis sp TAVR 7893, chronic diastolic heart failure, history of pancreatic cancer status post Whipple's procedure and chemotherapy, history of renal cell cancer, chronic kidney disease stage III, type 2 diabetes mellitus, hypertension and dyslipidemia. On 02/22/2021 tested positive for COVID-19, he was evaluated in the ED and discharged on Paxlovid.  He has been vaccinated and boosted for COVID-19.   Unfortunately did not took antiviral therapy, because pills were to big to swallow and returned to the ED 10 days later.  He reported generalized weakness, confusion and poor appetite.  On his initial physical examination his blood pressure was 110/7076, respiratory rate 13, temperature 98.6, oxygen saturation 97%, he had dry mucous membranes, lungs clear to auscultation, heart S1-S2, present, rhythmic, soft abdomen, no lower extremity edema.  Sodium 128, potassium 4.4, chloride 100, bicarb 20, glucose 130, BUN 22, creatinine 1.56, serum osmolarity 274, white count 10.2, hemoglobin 11.5, hematocrit 35.8, platelets 105.   UA with specific gravity 1.021, 30 protein.  Head CT no acute changes.  Chest radiograph with hyperinflation, no infiltrates.  EKG 86 bpm, normal axis, normal intervals, sinus rhythm, small q wave lead II, lead III, aVF, no ST segment changes, negative T wave lead II, lead III, aVF.   Patient has been placed on supportive medical therapy including intravenous fluids with close neurologic monitoring.   Assessment & Plan:   Principal Problem:   COVID-19 Active Problems:   CAD (coronary artery disease)   Hyponatremia    Generalized weakness   Confusion   COVID-19 virus infection   Pressure injury of skin   Acute metabolic encephalopathy, in the setting of recent SARS COVID 19 infection.  This am patient is feeling better but not yet back to baseline, no nausea or vomiting. No confusion but continue to be very weak and deconditioned.  Continue neuro checks per unit protocol. Follow with PT, OT and nutrition recommendations. Out of bed to chair TID with meals.  Today is 15 days post initial positive COVID 19 PCR, patient is afebrile with no respiratory symptoms. Not candidate at this point for steroids or further antiviral therapy.   Will discontinue airborne isolation.  Patient will need SNF at discharge.   2. 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 Non anion gap metabolic acidosis.  Patient with worsening hyponatremia, despite normal saline infusion. Clinically more euvolemic.  Sodium improved after sodium tablets and oral salt on bicarbonate Follow up renal function and electrolytes in am, avoid hypotension and nephrotoxic medications.  Liberate diet to regular with no restrictions.   3. CAD, dyslipidemia, HTN, systolic heart failure. No signs of decompensation, continue blood pressure monitoring   Continue with statin therapy and clopidogrel   4. T2DM fasting glucose this am at 127 mg/dl, patient is tolerating po well.  Continue to hold on basal insulin for now.   5. Hx od pancreatic cancer sp Whipple procedure./. iron deficiency anemia Patient very weak and deconditioned. Continue supportive medical care.  Continue iron supplementation.    Patient continue to be at high risk for worsening hyponatremia   Status is: Inpatient  Remains inpatient appropriate because:Inpatient level of care appropriate  due to severity of illness  Dispo: The patient is from: Home              Anticipated d/c is to: SNF              Patient currently is  medically stable to d/c.will need SNF   Difficult to place patient No    DVT prophylaxis: Enoxaparin   Code Status:   Full  Family Communication:  I spoke with patient's wife at the bedside, we talked in detail about patient's condition, plan of care and prognosis and all questions were addressed.    Subjective:  Denies any dyspnea, reports she is extremely weak and fatigued   Objective: Vitals:   03/06/21 2339 03/07/21 0420 03/07/21 0728 03/07/21 1300  BP: (!) 149/83 (!) 160/78 (!) 146/77 107/78  Pulse: 74 69 72 90  Resp: 14 15 20  (!) 23  Temp: 98 F (36.7 C) 98.6 F (37 C) 97.7 F (36.5 C)   TempSrc: Oral Axillary Oral   SpO2: 93% 92% 93% 97%  Weight:  55.8 kg      Intake/Output Summary (Last 24 hours) at 03/07/2021 1358 Last data filed at 03/07/2021 0424 Gross per 24 hour  Intake --  Output 150 ml  Net -150 ml   Filed Weights   03/07/21 0420  Weight: 55.8 kg    Examination:   Awake Alert, Oriented X 3, has difficulty hearing, wearing hearing aid, extremely thin, chronically ill-appearing.   Symmetrical Chest wall movement, Good air movement bilaterally, CTAB RRR,No Gallops,Rubs or new Murmurs, No Parasternal Heave +ve B.Sounds, Abd Soft, No tenderness, No rebound - guarding or rigidity. No Cyanosis, Clubbing or edema, No new Rash or bruise        Data Reviewed: I have personally reviewed following labs and imaging studies  CBC: Recent Labs  Lab 03/04/21 1924  WBC 10.2  NEUTROABS 8.5*  HGB 11.9*  HCT 35.3*  MCV 92.4  PLT 676*   Basic Metabolic Panel: Recent Labs  Lab 03/04/21 1924 03/05/21 0037 03/05/21 0140 03/06/21 0113 03/07/21 0055  NA 125* 128* 128* 126* 130*  K 4.3 4.4 4.4 3.9 3.9  CL 97* 100 101 102 105  CO2 20* 20* 19* 17* 19*  GLUCOSE 151* 130* 123* 127* 131*  BUN 22 22 21 16 20   CREATININE 1.62* 1.56* 1.46* 1.24 1.30*  CALCIUM 8.7* 8.2* 8.2* 8.3* 8.6*   GFR: Estimated Creatinine Clearance: 37.6 mL/min (A) (by C-G formula  based on SCr of 1.3 mg/dL (H)). Liver Function Tests: Recent Labs  Lab 03/04/21 1924  AST 22  ALT 19  ALKPHOS 89  BILITOT 0.5  PROT 6.1*  ALBUMIN 3.0*   Recent Labs  Lab 03/04/21 1924  LIPASE 15   No results for input(s): AMMONIA in the last 168 hours. Coagulation Profile: No results for input(s): INR, PROTIME in the last 168 hours. Cardiac Enzymes: No results for input(s): CKTOTAL, CKMB, CKMBINDEX, TROPONINI in the last 168 hours. BNP (last 3 results) No results for input(s): PROBNP in the last 8760 hours. HbA1C: Recent Labs    03/05/21 0037  HGBA1C 7.2*   CBG: Recent Labs  Lab 03/05/21 0102 03/05/21 0811  GLUCAP 131* 118*   Lipid Profile: No results for input(s): CHOL, HDL, LDLCALC, TRIG, CHOLHDL, LDLDIRECT in the last 72 hours. Thyroid Function Tests: Recent Labs    03/05/21 0037  TSH 1.132   Anemia Panel: No results for input(s): VITAMINB12, FOLATE, FERRITIN, TIBC, IRON, RETICCTPCT in the last 72  hours.    Radiology Studies: I have reviewed all of the imaging during this hospital visit personally     Scheduled Meds:  atorvastatin  20 mg Oral QHS   clopidogrel  75 mg Oral Q breakfast   enoxaparin (LOVENOX) injection  30 mg Subcutaneous Q24H   ferrous sulfate  650 mg Oral Q breakfast   insulin aspart  0-5 Units Subcutaneous QHS   insulin aspart  0-9 Units Subcutaneous TID WC   lipase/protease/amylase  72,000 Units Oral TID WC   megestrol  200 mg Oral TID AC   pantoprazole  40 mg Oral BID   sodium bicarbonate  650 mg Oral TID   sodium chloride  1 g Oral TID WC   Continuous Infusions:     LOS: 2 days        Phillips Climes, MD

## 2021-03-08 ENCOUNTER — Telehealth: Payer: Self-pay

## 2021-03-08 DIAGNOSIS — R531 Weakness: Secondary | ICD-10-CM

## 2021-03-08 MED ORDER — DIPHENOXYLATE-ATROPINE 2.5-0.025 MG PO TABS
1.0000 | ORAL_TABLET | Freq: Four times a day (QID) | ORAL | 0 refills | Status: DC | PRN
Start: 2021-03-08 — End: 2021-05-12

## 2021-03-08 NOTE — Progress Notes (Signed)
PT Cancellation Note  Patient Details Name: Manuel Olson MRN: [id number] DOB: 10-Apr-1946   Cancelled Treatment:    Reason Eval/Treat Not Completed: Other (comment) other skilled services working with patient. Will attempt to return as able prior to DC.   Windell Norfolk, DPT, PN1   Supplemental Physical Therapist [facility]    Pager [phone] Acute Rehab Office [phone]

## 2021-03-08 NOTE — Telephone Encounter (Signed)
If he is not elligible for home health can we try outpatient PT referral?  Algis Greenhouse. [name], MD 03/08/2021 3:54 PM

## 2021-03-08 NOTE — Progress Notes (Signed)
Initial Nutrition Assessment  DOCUMENTATION CODES:   Underweight, Severe malnutrition in context of chronic illness  INTERVENTION:   -Ensure Enlive po TID, each supplement provides 350 kcal and 20 grams of protein  -MVI with minerals daily  NUTRITION DIAGNOSIS:   Severe Malnutrition related to chronic illness (pancreatic cancer) as evidenced by percent weight loss, severe fat depletion, severe muscle depletion.  GOAL:   Patient will meet greater than or equal to 90% of their needs  MONITOR:   PO intake, Supplement acceptance, Labs, Weight trends, Skin, I & O's  REASON FOR ASSESSMENT:   Consult Assessment of nutrition requirement/status  ASSESSMENT:   Manuel Olson is a 75 y.o. male with medical history significant of CAD status post PCI, severe aortic stenosis status post TAVR in 4174, chronic systolic CHF (EF 45 to 08% on echo done 07/2020), history of pancreatic cancer status post Whipple procedure and chemotherapy, history of left renal cell carcinoma status post radical nephrectomy, CKD stage III, insulin-dependent type 2 diabetes, hypertension, hyperlipidemia.  He was seen in the ED on 02/22/2021 for generalized weakness, cough and diagnosed with COVID-19.  Discharged on Paxlovid but did not take it.  He returns to the ED today complaining of generalized weakness and poor appetite.  Daughter also reported some degree of confusion.  On arrival to the ED, his blood pressure was recorded as 82/58 at triage which was a false reading as subsequent blood pressure readings were normal before patient was given any IV fluids.  Borderline tachycardic initially, subsequently improved.  No fever or hypoxia.  Labs showing WBC 10.2, hemoglobin 11.9 (stable), platelet count 105K (low on previous labs as well and stable).  Sodium 125 (was 129 on 02/22/2021 but previously within normal range), potassium 4.3, chloride 97, bicarb 20, anion gap 8, BUN 22, creatinine 1.6 (baseline 1.4), glucose  151.  Lipase and LFTs normal.  Lactic acid 1.4 > 1.9.  UA without signs of infection.  Chest x-ray not suggestive of pneumonia.  CT head negative for acute intracranial abnormality. Patient was given 1 L normal saline bolus.  Pt admitted with COVID-19 viral infection.  Reviewed I/O's: +240 ml x 24 hours and +264 ml since admission  Spoke with pt and wife at bedside. Per pt, he has experienced a decreased appetite over the past 2 weeks secondary to lack of taste and smell, as well as increased diarrhea. At baseline, pt reports good appetite, typically consuming 3 meals per day. Pt wife is extremely liberal with his diet and will buy whatever foods are appealing to him.   Over the past few days, pt's taste and smell have improved and he has eaten 100% of his meals within the past 24 hours.   Per pt, UBW is around 133#. Per wife, pt has lost about 10 pounds over the past week. Reviewed wt hx; pt has experienced a 10.3% wt loss over the past 3 months, which is significant for time frame.   Pt with cheonic diarrhea, which he receives some relief from lomotil. Per wife, pt's pancreatic enzymes were also recently increased.   Discussed importance of good meal and supplement intake to promote healing. Per pt, he hated Ensure in the past, but he is willing to try again. RD also discussed ways to add additional calories and protein.   Medications reviewed and include creon and ferrous sulfate.  Lab Results  Component Value Date   HGBA1C 7.2 (H) 03/05/2021   PTA DM medications are 2-3 units insulin lispro TID  with meals and 11 units insulin glargine daily.   Labs reviewed: Na: 130, CBGS: 107-118 (inpatient orders for glycemic control are 0-5 units insulin aspart daily at bedtime, 0-9 units insulin aspart TID with meals).    NUTRITION - FOCUSED PHYSICAL EXAM:  Flowsheet Row Most Recent Value  Orbital Region Severe depletion  Upper Arm Region Severe depletion  Thoracic and Lumbar Region Severe  depletion  Buccal Region Severe depletion  Temple Region Severe depletion  Clavicle Bone Region Severe depletion  Clavicle and Acromion Bone Region Severe depletion  Scapular Bone Region Severe depletion  Dorsal Hand Severe depletion  Patellar Region Severe depletion  Anterior Thigh Region Severe depletion  Posterior Calf Region Severe depletion  Edema (RD Assessment) None  Hair Reviewed  Eyes Reviewed  Mouth Reviewed  Skin Reviewed  Nails Reviewed       Diet Order:   Diet Order             Diet - low sodium heart healthy           Diet Carb Modified           Diet regular Room service appropriate? Yes; Fluid consistency: Thin  Diet effective now                   EDUCATION NEEDS:   Education needs have been addressed  Skin:  Skin Assessment: Skin Integrity Issues: Skin Integrity Issues:: Stage II Stage II: coccyx  Last BM:  03/07/21  Height:   Ht Readings from Last 1 Encounters:  02/22/21 5' 9"  (1.753 m)    Weight:   Wt Readings from Last 1 Encounters:  03/08/21 52.3 kg    Ideal Body Weight:  72.7 kg  BMI:  Body mass index is 17.03 kg/m.  Estimated Nutritional Needs:   Kcal:  2000-2200  Protein:  115-130 grams  Fluid:  > 2 L    Loistine Chance, RD, LDN, [place] Registered Dietitian II Certified Diabetes Care and Education Specialist Please refer to [facility] for RD and/or RD on-call/weekend/after hours pager

## 2021-03-08 NOTE — Progress Notes (Signed)
Per chart, patient and spouse now refusing SNF and requesting OP PT. Unfortunately he discharged prior to this therapist being able to see him, however based on his PT evaluation this is certainly reasonable.  Currently recommend skilled OP PT services f/u, no DME needed.  Windell Norfolk, DPT, PN1   Supplemental Physical Therapist [facility]    Pager [phone] Acute Rehab Office [phone]

## 2021-03-08 NOTE — Plan of Care (Signed)

## 2021-03-08 NOTE — TOC Initial Note (Signed)
Transition of Care [facility]) - Initial/Assessment Note    Patient Details  Name: TATE ZAGAL MRN: [id number] Date of Birth: 08-08-1946  Transition of Care [facility]) CM/SW Contact:    Ninfa Meeker, RN Phone Number: 03/08/2021, 10:14 AM  Clinical Narrative:   Patient does not want to go to SNF, wants to go home with wife. Case Manager spoke with patient's wife concerning [place] agencies and DME. They have a rolling walker and tub bench at home. Mrs. Turvey said she has never used Home Health services and has no preference, gave CM permission to call referral to [facility] liaison.   Expected Discharge Plan: [place] Barriers to Discharge: No Barriers Identified   Patient Goals and CMS Choice Patient states their goals for this hospitalization and ongoing recovery are:: get better per wife   Choice offered to / list presented to : Spouse  Expected Discharge Plan and Services Expected Discharge Plan: [place] In-house Referral: NA Discharge Planning Services: CM Consult Post Acute Care Choice: [place] arrangements for the past 2 months: Single Family Home Expected Discharge Date: 03/08/21               DME Arranged: N/A DME Agency: NA       HH Arranged: RN, PT, OT, Nurse's Aide [place] Agency: [place] Date [facility] Agency Contacted: 03/08/21 Time HH Agency Contacted: 48 Representative spoke with at [place]: Adela Lank  Prior Living Arrangements/Services Living arrangements for the past 2 months: [place] Lives with:: Spouse Patient language and need for interpreter reviewed:: Yes Do you feel safe going back to the place where you live?: Yes      Need for Family Participation in Patient Care: Yes (Comment) Care giver support system in place?: Yes (comment)   Criminal Activity/Legal Involvement Pertinent to Current Situation/Hospitalization: No - Comment as needed  Activities of Daily  Living      Permission Sought/Granted   Permission granted to share information with : Yes, Release of Information Signed     Permission granted to share info w AGENCY: [facility]        Emotional Assessment       Orientation: : Oriented to Self, Oriented to Place, Oriented to  Time, Oriented to Situation Alcohol / Substance Use: Not Applicable Psych Involvement: No (comment)  Admission diagnosis:  Confusion [R41.0] Hyponatremia [E87.1] Generalized weakness [R53.1] COVID [U07.1] COVID-19 [U07.1] COVID-19 virus infection [U07.1] Patient Active Problem List   Diagnosis Date Noted   Pressure injury of skin 03/07/2021   COVID-19 virus infection 03/05/2021   COVID-19 03/04/2021   Generalized weakness 03/04/2021   Confusion 03/04/2021   Renal cell carcinoma ([place]) 08/13/2020   Anastomotic ulcer 01/21/2020   Long term (current) use of antithrombotics/antiplatelets 01/21/2020   Upper gastrointestinal bleed 12/31/2019   Acute GI bleeding 12/30/2019   Angina pectoris ([place]) 08/22/2019   Protein-calorie malnutrition, severe 04/05/2018   Debility 02/27/2018   Port-A-Cath in place 10/10/2017   Renal cell carcinoma of right kidney [facility]) s/p nephrectomy 02/2018 10/02/2017   Ileitis, terminal ([place]) 09/28/2017   Hyponatremia 09/27/2017   Insulin dependent type 2 diabetes mellitus ([place]) 08/25/2017   Mixed hyperlipidemia 08/25/2017   Essential hypertension, benign    CAD (coronary artery disease)    Former smoker    Severe AS S/P TAVR  08/22/2017   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 [place] [address] Phone: [phone] Fax: [phone]     Social Determinants of Health (SDOH) Interventions    Readmission Risk Interventions No flowsheet data found.

## 2021-03-08 NOTE — Telephone Encounter (Signed)
Transition Care Management Follow-up Telephone Call Date of discharge and from where:  03/08/21 How have you been since you were released from the hospital? weak Any questions or concerns? Yes  Items Reviewed: Did the pt receive and understand the discharge instructions provided? Yes  Medications obtained and verified? Yes  Other? No  Any new allergies since your discharge? No  Dietary orders reviewed? Yes Do you have support at home? Yes   Home Care and Equipment/Supplies: Were home health services ordered? no If so, what is the name of the agency?   Has the agency set up a time to come to the patient's home? not applicable Were any new equipment or medical supplies ordered?  No What is the name of the medical supply agency?  Were you able to get the supplies/equipment? not applicable Do you have any questions related to the use of the equipment or supplies? No  Functional Questionnaire: (I = Independent and D = Dependent) ADLs: I  Bathing/Dressing- I  Meal Prep- I  Eating- I  Maintaining continence- I  Transferring/Ambulation- I  Managing Meds- I WITH ASSISTANCE FROM WIFE   Follow up appointments reviewed:  PCP Hospital f/u appt confirmed? No  STATED WILL SCHEDULE AFTER 03/22/21 Specialist Hospital f/u appt confirmed? No   Are transportation arrangements needed? No  If their condition worsens, is the pt aware to call PCP or go to the Emergency Dept.? Yes Was the patient provided with contact information for the PCP's office or ED? Yes Was to pt encouraged to call back with questions or concerns? Yes

## 2021-03-08 NOTE — Discharge Summary (Signed)
PATIENT DETAILS Name: Manuel Olson Age: 75 y.o. Sex: male Date of Birth: 1946/02/29 MRN: [id number]. Admitting Physician: Jonetta Osgood, MD UUE:[id number], Algis Greenhouse, MD  Admit Date: 03/04/2021 Discharge date: 03/08/2021  Recommendations for Outpatient Follow-up:  Follow up with PCP in 1-2 weeks Please obtain CMP/CBC in one week   Admitted From:  Home  Disposition: Home with home health services (refused SNF-spouse at bedside-wants to take him  home as well)   Home Health: Yes  Equipment/Devices: None  Discharge Condition: Stable  CODE STATUS: FULL CODE  Diet recommendation:  Diet Order             Diet - low sodium heart healthy           Diet Carb Modified           Diet regular Room service appropriate? Yes; Fluid consistency: Thin  Diet effective now                    Brief Narrative: Patient is a 75 y.o. male with PMHx of chronic systolic heart failure, severe aortic stenosis-s/p TAVR 2018, history of pancreatic cancer-s/p Whipple's procedure, history of left renal cell carcinoma-s/p radical nephrectomy, CKD stage IIIb, IDDM-2, HTN, HLD-who was diagnosed with COVID-19 on 6/13-discharged on Paxlovid but never took it (due to concern about kidney issues)-brought in the hospital on 6/23 with worsening weakness, confusion-found to have hyponatremia-subsequently admitted to the hospitalist service.  See below for further details.     COVID-19 vaccinated status: Vaccinated including booster   Significant Events: 6/13>> COVID-19 positive-evaluated in ED-discharged on Paxlovid 6/23>> Admit to [facility] for weakness/confusion/hyponatremia.   Significant studies: 6/23>> CT head: No acute intracranial abnormality 6/23>> CXR: No pneumonia   COVID-19 medications: None   Antibiotics: None   Microbiology data: None   Procedures: None   Consults: None  Brief Hospital Course: Hyponatremia: Likely due to dehydration/poor oral intake-treated with  IV fluids-as his oral intake is improved-sodium levels have improved significantly.  Repeat chemistry panel in 1 week.  Acute metabolic encephalopathy: Likely due to hyponatremia/dehydration/COVID 19 infection-seems to have improved-he is now back to baseline and completely awake and alert/oriented.  CT head negative.     Generalized weakness: Due to recent COVID-19 infection-hyponatremia-TSH stable-PT OT/initially recommended SNF-however over the weekend-patient has improved-both him and his wife now prefer to go home with home health services.  We will order maximal home health services-have notified case management as well.   COVID-19 infection: Tested positive on 6/13-no pneumonia on this CXR-no hypoxia-doubt any benefit from Remdesivir/other therapies at this point.  Suspect does not require isolation as already beyond 10 days.   History of CAD: No anginal symptoms: Continue Plavix/statin   Chronic systolic heart failure (EF 45-50% by echo on November 2021): Volume status stable   CKD stage IIIb-solitary kidney-history of left nephrectomy (hx of renal cancer): Creatinine at baseline-follow periodically.  Do not think he had AKI.   History of pancreatic cancer-s/p Whipple's procedure   DM-2 (A1c 7.2 on 6/24): CBG stable on SSI.  Resume usual outpatient insulin regimen on discharge.  RN pressure injury documentation: Pressure Injury 03/06/21 Coccyx Mid;Upper Stage 2 -  Partial thickness loss of dermis presenting as a shallow open injury with a red, pink wound bed without slough. healing stage 2 (Active)  03/06/21 2239  Location: Coccyx  Location Orientation: Mid;Upper  Staging: Stage 2 -  Partial thickness loss of dermis presenting as a shallow open injury with a red, pink  wound bed without slough.  Wound Description (Comments): healing stage 2  Present on Admission: Yes     Discharge Diagnoses:  Principal Problem:   COVID-19 Active Problems:   CAD (coronary artery disease)    Hyponatremia   Generalized weakness   Confusion   COVID-19 virus infection   Pressure injury of skin   Discharge Instructions:  Activity:  As tolerated with Full fall precautions use walker/cane & assistance as needed  Discharge Instructions     Call MD for:  difficulty breathing, headache or visual disturbances   Complete by: As directed    Diet - low sodium heart healthy   Complete by: As directed    Diet Carb Modified   Complete by: As directed    Increase activity slowly   Complete by: As directed    No wound care   Complete by: As directed       Allergies as of 03/08/2021       Reactions   Carvedilol Other (See Comments)   "Could not function when taking this"        Medication List     TAKE these medications    acetaminophen 325 MG tablet Commonly known as: TYLENOL Take 325-650 mg by mouth every 6 (six) hours as needed for mild pain.   atorvastatin 20 MG tablet Commonly known as: LIPITOR TAKE 1 TABLET BY MOUTH ONCE DAILY. What changed: when to take this   Basaglar KwikPen 100 UNIT/ML Inject 8 Units into the skin See admin instructions. Inject 8 units into the skin in the morning, WHEN EATING What changed: Another medication with the same name was removed. Continue taking this medication, and follow the directions you see here.   clopidogrel 75 MG tablet Commonly known as: PLAVIX TAKE 1 TABLET BY MOUTH ONCE DAILY WITH BREAKFAST. What changed:  how much to take how to take this when to take this additional instructions   Creon 36000 UNITS Cpep capsule Generic drug: lipase/protease/amylase TAKE 1 CAPSULE BY MOUTH BEFORE EACH MEAL AND SNACK. TAKE UP TO 6 CAPSULES PER DAY. What changed: See the new instructions.   diphenoxylate-atropine 2.5-0.025 MG tablet Commonly known as: LOMOTIL Take 1 tablet by mouth 4 (four) times daily as needed for diarrhea or loose stools.   ferrous sulfate 325 (65 FE) MG tablet Take 650 mg by mouth daily with  breakfast.   FreeStyle Libre 14 Day Sensor Misc Inject 1 patch into the skin every 14 (fourteen) days.   insulin lispro 100 UNIT/ML KwikPen Commonly known as: HumaLOG KwikPen Inject 5-8 Units into the skin 3 (three) times daily before meals. Inject 5-8 units 3 times a day with meals. What changed:  how much to take when to take this additional instructions   losartan 25 MG tablet Commonly known as: COZAAR Take 0.5 tablets (12.5 mg total) by mouth daily.   megestrol 40 MG/ML suspension Commonly known as: MEGACE Take 200 mg by mouth 3 (three) times daily before meals.   nitroGLYCERIN 0.4 MG SL tablet Commonly known as: NITROSTAT Place 1 tablet (0.4 mg total) under the tongue every 5 (five) minutes x 3 doses as needed for chest pain (if no relief after 2nd dose, proceed to the ED for an evaluation or call 911). What changed: reasons to take this   pantoprazole 40 MG tablet Commonly known as: PROTONIX Take 1 tablet (40 mg total) by mouth 2 (two) times daily.   Pen Needles 31G X 6 MM Misc Use as directed to check  blood sugar 4 times a day.        Follow-up Information     [name], MD. Schedule an appointment as soon as possible for a visit in 1 week(s).   Specialty: Family Medicine Why: Hospital follow up Contact information: [place] [address] [phone]         Satira Sark, MD Follow up in 1 month(s).   Specialty: Cardiology Contact information: [place] [address] [phone]                Allergies  Allergen Reactions   Carvedilol Other (See Comments)    "Could not function when taking this"      Consultations: None   Other Procedures/Studies: DG Chest 2 View  Result Date: 02/22/2021 CLINICAL DATA:  Cough and weakness for 1 week EXAM: CHEST - 2 VIEW COMPARISON:  08/10/2020 FINDINGS: Cardiac shadow is within normal limits. Findings are noted. Aortic calcifications are seen and stable.  The lungs are hyperinflated without focal infiltrate or effusion. Calcified granuloma is again seen in the right mid lung. Degenerative changes of the thoracic spine. IMPRESSION: COPD without acute abnormality. Electronically Signed   By: Inez Catalina M.D.   On: 02/22/2021 08:22   CT Head Wo Contrast  Result Date: 03/04/2021 CLINICAL DATA:  Altered mental status EXAM: CT HEAD WITHOUT CONTRAST TECHNIQUE: Contiguous axial images were obtained from the base of the skull through the vertex without intravenous contrast. COMPARISON:  MRI 07/29/2017 FINDINGS: Brain: Normal anatomic configuration. Parenchymal volume loss is commensurate with the patient's age. Moderate periventricular white matter changes are present likely reflecting the sequela of small vessel ischemia, stable since prior examination. 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 No abnormal intra or extra-axial mass lesion or fluid collection. No abnormal mass effect or midline shift. No evidence of acute intracranial hemorrhage or infarct. Ventricular size is normal. Cerebellum unremarkable. Vascular: No asymmetric hyperdense vasculature at the skull base. Skull: Intact Sinuses/Orbits: Paranasal sinuses are clear. Orbits are unremarkable. Other: Mastoid air cells and middle ear cavities are clear. IMPRESSION: No evidence of acute intracranial hemorrhage or infarct. Stable moderate senescent change. 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 Electronically Signed   By: Fidela Salisbury MD   On: 03/04/2021 21:38   DG Chest Port 1 View  Result Date: 03/04/2021 CLINICAL DATA:  History of COVID, initial encounter EXAM: PORTABLE CHEST 1 VIEW COMPARISON:  02/22/2021 FINDINGS: None cardiac shadow is stable. Changes of prior TAVR are seen. Aortic calcifications are again noted. The lungs are hyperinflated but clear. Stable granuloma is noted on the right. No acute bony abnormality noted. IMPRESSION: COPD without acute abnormality.  Electronically Signed   By: Inez Catalina M.D.   On: 03/04/2021 20:17   DG Abd 2 Views  Result Date: 02/17/2021 CLINICAL DATA:  Abdominal pain EXAM: ABDOMEN - 2 VIEW COMPARISON:  CT abdomen pelvis 10/15/2020 FINDINGS: Prosthetic aortic valve is noted. Right lower lobe pulmonary nodule again seen and unchanged compared to CT from 12/25/2017. Severe is a benign etiology. No dilated loops of bowel to indicate ileus or obstruction. Degenerative changes seen throughout the lumbar spine. Right inguinal hernia mesh is noted. IMPRESSION: Nonobstructive nonspecific bowel gas pattern. Electronically Signed   By: Miachel Roux M.D.   On: 02/17/2021 13:23     TODAY-DAY OF DISCHARGE:  Subjective:   Manuel Olson today has no headache,no chest abdominal pain,no new weakness tingling or numbness, feels much better  wants to go home today.   Objective:   Blood pressure 125/77, pulse 72, temperature 98 F (36.7 C), temperature source Oral, resp. rate 13, weight 52.3 kg, SpO2 93 %.  Intake/Output Summary (Last 24 hours) at 03/08/2021 1012 Last data filed at 03/08/2021 0958 Gross per 24 hour  Intake 240 ml  Output --  Net 240 ml   Filed Weights   03/07/21 0420 03/08/21 0423  Weight: 55.8 kg 52.3 kg    Exam: Awake Alert, Oriented *3, No new F.N deficits, Normal affect [place].AT,PERRAL Supple Neck,No JVD, No cervical lymphadenopathy appriciated.  Symmetrical Chest wall movement, Good air movement bilaterally, CTAB RRR,No Gallops,Rubs or new Murmurs, No Parasternal Heave +ve B.Sounds, Abd Soft, Non tender, No organomegaly appriciated, No rebound -guarding or rigidity. No Cyanosis, Clubbing or edema, No new Rash or bruise   PERTINENT RADIOLOGIC STUDIES: No results found.   PERTINENT LAB RESULTS: CBC: No results for input(s): WBC, HGB, HCT, PLT in the last 72 hours. CMET CMP     Component Value Date/Time   NA 130 (L) 03/07/2021 0055   NA 139 09/07/2017 0844   K 3.9 03/07/2021 0055   K 5.4 No  visable hemolysis (H) 09/07/2017 0844   CL 105 03/07/2021 0055   CO2 19 (L) 03/07/2021 0055   CO2 27 09/07/2017 0844   GLUCOSE 131 (H) 03/07/2021 0055   GLUCOSE 227 (H) 09/07/2017 0844   BUN 20 03/07/2021 0055   BUN 29.8 (H) 09/07/2017 0844   CREATININE 1.30 (H) 03/07/2021 0055   CREATININE 1.67 (H) 01/09/2020 1348   CREATININE 1.2 09/07/2017 0844   CALCIUM 8.6 (L) 03/07/2021 0055   CALCIUM 9.8 09/07/2017 0844   PROT 6.1 (L) 03/04/2021 1924   PROT 6.9 09/07/2017 0844   ALBUMIN 3.0 (L) 03/04/2021 1924   ALBUMIN 3.9 09/07/2017 0844   AST 22 03/04/2021 1924   AST 16 01/09/2020 1348   AST 13 09/07/2017 0844   ALT 19 03/04/2021 1924   ALT 14 01/09/2020 1348   ALT 12 09/07/2017 0844   ALKPHOS 89 03/04/2021 1924   ALKPHOS 89 09/07/2017 0844   BILITOT 0.5 03/04/2021 1924   BILITOT 0.2 (L) 01/09/2020 1348   BILITOT 0.37 09/07/2017 0844   GFRNONAA 57 (L) 03/07/2021 0055   GFRNONAA 39 (L) 01/09/2020 1348   GFRAA 45 (L) 01/09/2020 1348    GFR Estimated Creatinine Clearance: 35.2 mL/min (A) (by C-G formula based on SCr of 1.3 mg/dL (H)). No results for input(s): LIPASE, AMYLASE in the last 72 hours. No results for input(s): CKTOTAL, CKMB, CKMBINDEX, TROPONINI in the last 72 hours. Invalid input(s): POCBNP No results for input(s): DDIMER in the last 72 hours. No results for input(s): HGBA1C in the last 72 hours. No results for input(s): CHOL, HDL, LDLCALC, TRIG, CHOLHDL, LDLDIRECT in the last 72 hours. No results for input(s): TSH, T4TOTAL, T3FREE, THYROIDAB in the last 72 hours.  Invalid input(s): FREET3 No results for input(s): VITAMINB12, FOLATE, FERRITIN, TIBC, IRON, RETICCTPCT in the last 72 hours. Coags: No results for input(s): INR in the last 72 hours.  Invalid input(s): PT Microbiology: No results found for this or any previous visit (from the past 240 hour(s)).  FURTHER DISCHARGE INSTRUCTIONS:  Get Medicines reviewed and adjusted: Please take all your medications  with you for your next visit with your Primary MD  Laboratory/radiological data: Please request your Primary MD to go over all hospital tests and procedure/radiological results at the follow up, please ask your Primary MD to get all  Hospital records sent to his/her office.  In some cases, they will be blood work, cultures and biopsy results pending at the time of your discharge. Please request that your primary care M.D. goes through all the records of your hospital data and follows up on these results.  Also Note the following: If you experience worsening of your admission symptoms, develop shortness of breath, life threatening emergency, suicidal or homicidal thoughts you must seek medical attention immediately by calling 911 or calling your MD immediately  if symptoms less severe.  You must read complete instructions/literature along with all the possible adverse reactions/side effects for all the Medicines you take and that have been prescribed to you. Take any new Medicines after you have completely understood and accpet all the possible adverse reactions/side effects.   Do not drive when taking Pain medications or sleeping medications (Benzodaizepines)  Do not take more than prescribed Pain, Sleep and Anxiety Medications. It is not advisable to combine anxiety,sleep and pain medications without talking with your primary care practitioner  Special Instructions: If you have smoked or chewed Tobacco  in the last 2 yrs please stop smoking, stop any regular Alcohol  and or any Recreational drug use.  Wear Seat belts while driving.  Please note: You were cared for by a hospitalist during your hospital stay. Once you are discharged, your primary care physician will handle any further medical issues. Please note that NO REFILLS for any discharge medications will be authorized once you are discharged, as it is imperative that you return to your primary care physician (or establish a relationship  with a primary care physician if you do not have one) for your post hospital discharge needs so that they can reassess your need for medications and monitor your lab values.  Total Time spent coordinating discharge including counseling, education and face to face time equals 35 minutes.  Signed: Bladyn Tipps 03/08/2021 10:12 AM

## 2021-03-09 ENCOUNTER — Ambulatory Visit: Payer: Medicare Other | Admitting: Cardiology

## 2021-03-09 NOTE — Telephone Encounter (Signed)
Referral has been placed, noted that pt would like to have pt with [facility] in [place].

## 2021-03-09 NOTE — Addendum Note (Signed)
Addended by: Clyde Lundborg A on: 03/09/2021 09:06 AM   Modules accepted: Orders

## 2021-03-29 ENCOUNTER — Ambulatory Visit (INDEPENDENT_AMBULATORY_CARE_PROVIDER_SITE_OTHER): Payer: Medicare Other | Admitting: Internal Medicine

## 2021-03-29 ENCOUNTER — Encounter: Payer: Self-pay | Admitting: Internal Medicine

## 2021-03-29 ENCOUNTER — Other Ambulatory Visit: Payer: Self-pay

## 2021-03-29 VITALS — BP 140/88 | HR 83 | Ht 69.0 in | Wt 114.8 lb

## 2021-03-29 DIAGNOSIS — E1159 Type 2 diabetes mellitus with other circulatory complications: Secondary | ICD-10-CM

## 2021-03-29 DIAGNOSIS — E782 Mixed hyperlipidemia: Secondary | ICD-10-CM | POA: Diagnosis not present

## 2021-03-29 DIAGNOSIS — I255 Ischemic cardiomyopathy: Secondary | ICD-10-CM

## 2021-03-29 DIAGNOSIS — E46 Unspecified protein-calorie malnutrition: Secondary | ICD-10-CM | POA: Diagnosis not present

## 2021-03-29 DIAGNOSIS — E1165 Type 2 diabetes mellitus with hyperglycemia: Secondary | ICD-10-CM | POA: Diagnosis not present

## 2021-03-29 NOTE — Progress Notes (Signed)
Patient ID: Manuel Olson, male   DOB: [birth year], 75 y.o.   MRN: [id number]   This visit occurred during the SARS-CoV-2 public health emergency.  Safety protocols were in place, including screening questions prior to the visit, additional usage of staff PPE, and extensive cleaning of exam room while observing appropriate contact time as indicated for disinfecting solutions.    HPI: Manuel Olson is a 75 y.o.-year-old male, initially referred by his oncologist, Dr. Benay Spice, presenting for follow-up for DM2, dx in 01/2016, insulin-dependent, uncontrolled, with complications (CAD - s/p AMI 11/2016 - s/p 4x DES; Ao stenosis). He saw Dr. Dorris Fetch before.  Last visit 6.5 months ago.  Of note, patient has a significant history of pancreatic cancer diagnosed 07/2017.  He started chemotherapy 09/2017.  Since then, he also had a kidney removed for renal cell carcinoma and at that time he had pancreatic resection (head of the pancreas in 02/2018.  He was found to have metastases in his lymph nodes.  He restarted chemotherapy after the surgery but finished 06/2018.  He had another CT of the abdomen (10/15/2017) which showed no new masses.  Interim history: Since last visit, he had COVID-19 in 02/2021 (mild case) >> hospitalized for 3 days for dehydration and weakness >> resolved.   He was not eating for 2 weeks before this episode as he could not eat and smell. Now started to eat.  Restarted Megace. He is now feeling better without complaints other than mild fatigue.  No increased urination, nausea, chest pain, shortness of breath.  He will start PT next month.  Reviewed HbA1c levels: Lab Results  Component Value Date   HGBA1C 7.2 (H) 03/05/2021   HGBA1C 6.6 (A) 12/04/2020   HGBA1C 7.2 (A) 05/21/2020   Pt was on a regimen of: - Glipizide 5 mg daily  - Tresiba 20 units at bedtime (samples, cannot afford this)    He is currently on:  - Basaglar 11 >> 8 units in a.m. - Humalog 3-6 >> 2-3 units  before the 3 meals  He checks his sugars more than 4 times a day with his CGM:  Previously:   Lowest sugar was  48 >> 49 >> 52 >> 50 (carvedilol); it is unclear at which level he has hypoglycemia awareness. Highest sugar was 300s >> 200s >> 277 >> 300.  Glucometer: True Metrix  Pt's meals are: - Breakfast: bacon + eggs + English muffin + occas. Tomato juice  - Lunch: grilled ham and cheese sandwich + potato chips - Dinner: chicken + veggies + starch - Snacks: fruit cup, pudding, fig newtons, icecreams  + CKD, last BUN/creatinine:  Lab Results  Component Value Date   BUN 20 03/07/2021   BUN 16 03/06/2021   CREATININE 1.30 (H) 03/07/2021   CREATININE 1.24 03/06/2021  On Cozaar. + HL; last set of lipids: Lab Results  Component Value Date   CHOL 92 10/09/2019   HDL 49.70 10/09/2019   LDLCALC 28 10/09/2019   TRIG 71.0 10/09/2019   CHOLHDL 2 10/09/2019  On Lipitor 20. - last eye exam was on 11/25/2020: No DR. 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  He has had laser surgeries in the past. - no numbness but has tingling in his feet  Pt has FH of DM in brother.  He also has a history of HTN.  ROS: + See HPI  Past Medical History:  Diagnosis Date   Arthritis    back   Blood transfusion without reported diagnosis  CAD (coronary artery disease)    a. 11/2016 in [place]: STEMI s/p DES to mid LAD, DES to diagonal, DES x 2 to proximal and mid RCA b. 08/2019: NSTEMI with DES to distal RCA   Essential hypertension    Full dentures    GERD (gastroesophageal reflux disease)    History of stroke    Hyperlipidemia    Myocardial infarction [facility])    Pancreatic cancer ([place])    Pneumonia    Renal cell carcinoma ([place])    S/P TAVR (transcatheter aortic valve replacement)    a. 08/2017:  Edwards Sapien 3 THV (size 26 mm, model #9600CM26A , serial # [id number])   Severe aortic stenosis    a. 08/2017: s/p TAVR by Dr. Angelena Form and Dr. Cyndia Bent. 2021 NO AVS on echo   Type 2 diabetes mellitus ([place])     Wears glasses    Wears hearing aid    Past Surgical History:  Procedure Laterality Date   APPENDECTOMY     CARDIAC CATHETERIZATION     CORONARY ANGIOGRAPHY N/A 08/23/2019   Procedure: CORONARY ANGIOGRAPHY;  Surgeon: Belva Crome, MD;  Location: [place] [facility];  Service: Cardiovascular;  Laterality: N/A;   CORONARY STENT INTERVENTION N/A 08/23/2019   Procedure: CORONARY STENT INTERVENTION;  Surgeon: Belva Crome, MD;  Location: [place] [facility];  Service: Cardiovascular;  Laterality: N/A;   CORONARY STENT PLACEMENT     x 4 in March 2018   ESOPHAGOGASTRODUODENOSCOPY (EGD) WITH PROPOFOL N/A 01/02/2020   Procedure: ESOPHAGOGASTRODUODENOSCOPY (EGD) WITH PROPOFOL;  Surgeon: Jackquline Denmark, MD;  Location: [facility];  Service: Endoscopy;  Laterality: N/A;   EUS N/A 08/02/2017   Procedure: UPPER ENDOSCOPIC ULTRASOUND (EUS) RADIAL;  Surgeon: [name], MD;  Location: [facility];  Service: Endoscopy;  Laterality: N/A;   EYE SURGERY Left    HAND SURGERY Bilateral    HEMOSTASIS CLIP PLACEMENT  01/02/2020   Procedure: HEMOSTASIS CLIP PLACEMENT;  Surgeon: Jackquline Denmark, MD;  Location: [facility];  Service: Endoscopy;;   HERNIA REPAIR Right    HOT HEMOSTASIS N/A 01/02/2020   Procedure: HOT HEMOSTASIS (ARGON PLASMA COAGULATION/BICAP);  Surgeon: Jackquline Denmark, MD;  Location: [facility];  Service: Endoscopy;  Laterality: N/A;   LAPAROSCOPY N/A 02/19/2018   Procedure: DIAGNOSTIC LAPAROSCOPY, ERAS PATHWAY;  Surgeon: [name], MD;  Location: [place];  Service: General;  Laterality: N/A;   MULTIPLE TOOTH EXTRACTIONS     NEPHRECTOMY Left 02/19/2018   Procedure: OPEN LEFT RADICAL NEPHRECTOMY;  Surgeon: Cleon Gustin, MD;  Location: [place];  Service: Urology;  Laterality: Left;   PORT-A-CATH REMOVAL N/A 06/13/2019   Procedure: PORT REMOVAL;  Surgeon: [name], MD;  Location: [place];  Service: General;  Laterality: N/A;   PORTACATH PLACEMENT N/A 09/15/2017   Procedure: INSERTION  PORT-A-CATH;  Surgeon: [name], MD;  Location: [place];  Service: General;  Laterality: N/A;   RIGHT/LEFT HEART CATH AND CORONARY ANGIOGRAPHY N/A 07/05/2017   Procedure: RIGHT/LEFT HEART CATH AND CORONARY ANGIOGRAPHY;  Surgeon: [name], MD;  Location: [place] [facility];  Service: Cardiovascular;  Laterality: N/A;   SCLEROTHERAPY  01/02/2020   Procedure: SCLEROTHERAPY;  Surgeon: Jackquline Denmark, MD;  [facility]: [facility];  Service: Endoscopy;;   SHOULDER ARTHROSCOPY WITH ROTATOR CUFF REPAIR Left    TEE WITHOUT CARDIOVERSION N/A 08/22/2017   Procedure: TRANSESOPHAGEAL ECHOCARDIOGRAM (TEE);  Surgeon: Burnell Blanks, MD;  Location: [place];  Service: Open Heart Surgery;  Laterality: N/A;   TRANSCATHETER AORTIC  VALVE REPLACEMENT, TRANSFEMORAL N/A 08/22/2017   Procedure: TRANSCATHETER AORTIC VALVE REPLACEMENT, TRANSFEMORAL;  Surgeon: Burnell Blanks, MD;  Location: [place];  Service: Open Heart Surgery;  Laterality: N/A;   UPPER GASTROINTESTINAL ENDOSCOPY     WHIPPLE PROCEDURE N/A 02/19/2018   Procedure: WHIPPLE PROCEDURE;  Surgeon: [name], MD;  Location: [place];  Service: General;  Laterality: N/A;   Social History   Socioeconomic History   Marital status: Married    Spouse name: Not on file   Number of children: Not on file  Social Needs  Occupational History    Retired  Tobacco Use   Smoking status: Former Smoker    Packs/day: 1.00    Years: 57.00    Pack years: 57.00    Types: Cigarettes    Start date: 09/12/1958    Last attempt to quit: 2018    Years since quitting: 1.5   Smokeless tobacco: Never Used  Substance and Sexual Activity   Alcohol use: No   Drug use: No   Current Outpatient Medications on File Prior to Visit  Medication Sig Dispense Refill   acetaminophen (TYLENOL) 325 MG tablet Take 325-650 mg by mouth every 6 (six) hours as needed for mild pain.     atorvastatin (LIPITOR) 20 MG tablet TAKE 1 TABLET BY MOUTH ONCE  DAILY. 90 tablet 3   clopidogrel (PLAVIX) 75 MG tablet TAKE 1 TABLET BY MOUTH ONCE DAILY WITH BREAKFAST. 90 tablet 3   Continuous Blood Gluc Sensor (FREESTYLE LIBRE 14 DAY SENSOR) MISC Inject 1 patch into the skin every 14 (fourteen) days.     CREON 36000 units CPEP capsule TAKE 1 CAPSULE BY MOUTH BEFORE EACH MEAL AND SNACK. TAKE UP TO 6 CAPSULES PER DAY. 90 capsule 0   diphenoxylate-atropine (LOMOTIL) 2.5-0.025 MG tablet Take 1 tablet by mouth 4 (four) times daily as needed for diarrhea or loose stools. 30 tablet 0   ferrous sulfate 325 (65 FE) MG tablet Take 650 mg by mouth daily with breakfast.     Insulin Glargine (BASAGLAR KWIKPEN) 100 UNIT/ML Inject 8 Units into the skin See admin instructions. Inject 8 units into the skin in the morning, WHEN EATING     insulin lispro (HUMALOG KWIKPEN) 100 UNIT/ML KwikPen Inject 5-8 Units into the skin 3 (three) times daily before meals. Inject 5-8 units 3 times a day with meals. 15 mL 3   Insulin Pen Needle (PEN NEEDLES) 31G X 6 MM MISC Use as directed to check blood sugar 4 times a day. 400 each 2   losartan (COZAAR) 25 MG tablet Take 0.5 tablets (12.5 mg total) by mouth daily. 45 tablet 3   megestrol (MEGACE) 40 MG/ML suspension Take 200 mg by mouth 3 (three) times daily before meals.     nitroGLYCERIN (NITROSTAT) 0.4 MG SL tablet Place 1 tablet (0.4 mg total) under the tongue every 5 (five) minutes x 3 doses as needed for chest pain (if no relief after 2nd dose, proceed to the ED for an evaluation or call 911). 25 tablet 3   pantoprazole (PROTONIX) 40 MG tablet Take 1 tablet (40 mg total) by mouth 2 (two) times daily. 60 tablet 11   No current facility-administered medications on file prior to visit.   Allergies  Allergen Reactions   Carvedilol Other (See Comments)    "Could not function when taking this"   Family History  Problem Relation Age of Onset   Lupus Mother    CAD Brother    Hypertension Brother  Colon cancer Neg Hx    Esophageal  cancer Neg Hx    Rectal cancer Neg Hx    Stomach cancer Neg Hx     PE: There were no vitals taken for this visit. Wt Readings from Last 3 Encounters:  03/08/21 115 lb 4.8 oz (52.3 kg)  02/22/21 122 lb (55.3 kg)  02/18/21 122 lb 6.4 oz (55.5 kg)   Constitutional: thin, in NAD Eyes: PERRLA, EOMI, no exophthalmos ENT: moist mucous membranes, no thyromegaly, no cervical lymphadenopathy Cardiovascular: RRR, No MRG Respiratory: CTA B Gastrointestinal: abdomen soft, NT, ND, BS+ Musculoskeletal: no deformities, strength intact in all 4 Skin: moist, warm, no rashes Neurological: no tremor with outstretched hands, DTR normal in all 4  ASSESSMENT: 1. DM2, insulin-dependent, uncontrolled, with complications - CAD - s/p AMI 11/2016 - s/p DES x4 -  Ao stenosis  His insulin production after pancreatic resection was low: Component     Latest Ref Rng & Units 10/12/2018  C-Peptide     0.80 - 3.85 ng/mL 0.53 (L)  Glucose, Plasma     65 - 99 mg/dL 168 (H)   2. HL  3.  Protein caloric malnutrition  PLAN:  1. Patient with history of initially controlled diabetes is worsening control after his pancreatic cancer diagnosis in 2018 and subsequent pancreatectomy.  He has low insulin production and is on a basal-bolus insulin regimen.  At last visit, we moved his NovoLog 15 minutes before each meal but we did not change his doses.  However, since then, he contacted me with low blood sugars in the 50s and 60s and even passing out from hypoglycemia after starting carvedilol in 01/2021.  I advised him to decrease the doses of both insulins, and since then, he stopped carvedilol. -Since then, he also developed COVID-19 in 02/2021.  He did not have smell and taste and could not eat for about 2 weeks.  He lost a significant amount of weight.  He is now restarting to eat.  He is also preparing to start physical therapy.  I did discuss with the patient and his wife that while in PT, they need to pay attention to  his blood sugars to see if he does not develop hypoglycemia.  In that case, to let me know, so we can decrease his insulin doses. CGM interpretation: -At today's visit, we reviewed his CGM downloads: It appears that 73% of values are in target range (goal >70%), while 27% are higher than 180 (goal <25%), and 0% are lower than 70 (goal <4%).  The calculated average blood sugar is 158.  The projected HbA1c for the next 3 months (GMI) is 7.1%. -Reviewing the CGM trends, sugars appear to be excellent overnight and in the morning, but increased after the first meal of the day and continue to increase throughout the day with the largest hyperglycemic peak after dinner.  Therefore, for now, I advised him to increase the dose of Humalog but he is extremely insulin sensitive so we will only increase by 1 unit since he is also preparing to start physical therapy, and we discussed about increasing by 1 more unit if he is eating larger meals. -At today's visit, we did not check another HbA1c since he had one less than a month ago and this was slightly higher, at 7.2% - I suggested to:  Patient Instructions  Please continue: - Basaglar 8 units in a.m.  Increase: - Humalog 3-4 units before the 3 meals, but 5 units before a larger  meal  Inject Humalog 15 min before meals.  Please return in 4 months.  - advised to check sugars at different times of the day - 4x a day, rotating check times - advised for yearly eye exams >> he is UTD - return to clinic in 4 months  2. HL -Reviewed latest lipid panel from 09/2019: All fractions at goal: Lab Results  Component Value Date   CHOL 92 10/09/2019   HDL 49.70 10/09/2019   LDLCALC 28 10/09/2019   TRIG 71.0 10/09/2019   CHOLHDL 2 10/09/2019  -He continues on Lipitor 20 mg daily without side effects  3.  Protein caloric malnutrition -At last visit he lost weight (6 pounds) due to abdominal distention after eating and was not eating well. -Since last visit, he  again had a period of 2 weeks in which she did not eat well due to losing taste and smell during his COVID-19 infection.  He lost 11 pounds since then.  He recently started to eat better. -We are continuing insulin, which has an anabolic effect  Philemon Kingdom, MD PhD [facility] Endocrinology

## 2021-03-29 NOTE — Patient Instructions (Addendum)
Please continue: - Basaglar 8 units in a.m.  Increase: - Humalog 3-4 units before the 3 meals, but 5 units before a larger meal  Inject Humalog 15 min before meals.  Please return in 4 months.

## 2021-04-06 ENCOUNTER — Ambulatory Visit (INDEPENDENT_AMBULATORY_CARE_PROVIDER_SITE_OTHER): Payer: Medicare Other | Admitting: Pharmacist

## 2021-04-06 DIAGNOSIS — Z794 Long term (current) use of insulin: Secondary | ICD-10-CM | POA: Diagnosis not present

## 2021-04-06 DIAGNOSIS — R531 Weakness: Secondary | ICD-10-CM | POA: Diagnosis not present

## 2021-04-06 DIAGNOSIS — E782 Mixed hyperlipidemia: Secondary | ICD-10-CM | POA: Diagnosis not present

## 2021-04-06 DIAGNOSIS — E119 Type 2 diabetes mellitus without complications: Secondary | ICD-10-CM | POA: Diagnosis not present

## 2021-04-06 DIAGNOSIS — I10 Essential (primary) hypertension: Secondary | ICD-10-CM

## 2021-04-06 DIAGNOSIS — R2689 Other abnormalities of gait and mobility: Secondary | ICD-10-CM | POA: Diagnosis not present

## 2021-04-06 NOTE — Progress Notes (Signed)
Chronic Care Management Pharmacy Note  04/06/2021 Name:  ADEEL GUIFFRE MRN:  [id number] DOB:  11/04/1945  Subjective: Manuel Olson is an 75 y.o. year old male who is a primary patient of [name], Algis Greenhouse, MD.  The CCM team was consulted for assistance with disease management and care coordination needs.   Engaged with patient by telephone for follow up visit in response to provider referral for pharmacy case management and/or care coordination services.   Consent to Services:  The patient was given information about Chronic Care Management services, agreed to services, and gave verbal consent prior to initiation of services.  Please see initial visit note for detailed documentation.   Patient Care Team: [name], MD as PCP - General (Family Medicine) Satira Sark, MD as PCP - Cardiology (Cardiology) Edythe Clarity, [facility] (Pharmacist)  Recent Consult Visits 03/29/21 Cruzita Lederer, Endocrine) - changes to basaglar 8 units hs and meal time anywhere from 3-5 depending on the size of the meal  Objective: Lab Results  Component Value Date   CREATININE 1.30 (H) 03/07/2021   CREATININE 1.24 03/06/2021   CREATININE 1.46 (H) 03/05/2021   GFR 45.03 (L) 02/16/2021   GFR 41.76 (L) 10/07/2020   GFRNONAA 57 (L) 03/07/2021   GFRNONAA 60 (L) 03/06/2021  Last diabetic Eye exam:  Lab Results  Component Value Date/Time   HMDIABEYEEXA No Retinopathy 11/25/2020 12:00 AM    Last diabetic Foot exam: No results found for: HMDIABFOOTEX  Lab Results  Component Value Date   CHOL 92 10/09/2019   CHOL 88 10/12/2018   TRIG 71.0 10/09/2019   TRIG 55.0 10/12/2018   HDL 49.70 10/09/2019   HDL 43.20 10/12/2018   CHOLHDL 2 10/09/2019   CHOLHDL 2 10/12/2018   VLDL 14.2 10/09/2019   VLDL 11.0 10/12/2018   LDLCALC 28 10/09/2019   LDLCALC 34 10/12/2018   Hepatic Function Latest Ref Rng & Units 03/04/2021 02/22/2021 02/16/2021  Total Protein 6.5 - 8.1 g/dL 6.1(L) 6.5 6.4  Albumin 3.5 -  5.0 g/dL 3.0(L) 3.4(L) 3.6  AST 15 - 41 U/L _0 ALT 0 - 44 U/L _1 Alk Phosphatase 38 - 126 U/L 89 95 109  Total Bilirubin 0.3 - 1.2 mg/dL 0.5 0.5 0.4  Bilirubin, Direct 0.1 - 0.5 mg/dL - - -   Lab Results  Component Value Date/Time   TSH 1.132 03/05/2021 12:37 AM   TSH 1.77 10/09/2019 10:19 AM   TSH 1.267 04/04/2018 09:50 AM   CBC Latest Ref Rng & Units 03/04/2021 02/22/2021 02/16/2021  WBC 4.0 - 10.5 K/uL 10.2 4.6 4.2  Hemoglobin 13.0 - 17.0 g/dL 11.9(L) 11.1(L) 12.3(L)  Hematocrit 39.0 - 52.0 % 35.3(L) 33.7(L) 37.0(L)  Platelets 150 - 400 K/uL 105(L) 79(L) 113.0(L)   No results found for: VD25OH  Clinical ASCVD: Yes  The ASCVD Risk score Mikey Bussing DC Jr., et al., 2013) failed to calculate for the following reasons:   The patient has a prior MI or stroke diagnosis    Other:  -ECHO (07/2020):  LVEF relatively stable at 45 to 50% range. Left ventricular diastolic parameters are consistent with Grade I diastolic dysfunction (impaired relaxation). Elevated left atrial pressure.   Social History   Tobacco Use  Smoking Status Former   Packs/day: 1.00   Years: 57.00   Pack years: 57.00   Types: Cigarettes   Start date: 09/12/1958   Quit date: 04/12/2016   Years since quitting: 4.9  Smokeless Tobacco Never  BP Readings from Last 3 Encounters:  03/29/21 140/88  03/08/21 125/77  02/22/21 (!) 143/70   Pulse Readings from Last 3 Encounters:  03/29/21 83  03/08/21 72  02/22/21 78   Wt Readings from Last 3 Encounters:  03/29/21 114 lb 12.8 oz (52.1 kg)  03/08/21 115 lb 4.8 oz (52.3 kg)  02/22/21 122 lb (55.3 kg)    Assessment: Review of patient past medical history, allergies, medications, health status, including review of consultants reports, laboratory and other test data, was performed as part of comprehensive evaluation and provision of chronic care management services.   SDOH:  (Social Determinants of Health) assessments and interventions performed:   CCM Care  Plan Allergies  Allergen Reactions   Carvedilol Other (See Comments)    "Could not function when taking this"   Medications Reviewed Today     Reviewed by Philemon Kingdom, MD (Physician) on 03/29/21 at 1113  Med List Status: <None>   Medication Order Taking? Sig Documenting Provider Last Dose Status Informant  acetaminophen (TYLENOL) 325 MG tablet [id number]  Take 325-650 mg by mouth every 6 (six) hours as needed for mild pain. [provider]  Active Spouse/Significant Other  atorvastatin (LIPITOR) 20 MG tablet [id number]  TAKE 1 TABLET BY MOUTH ONCE DAILY. Satira Sark, MD  Active Spouse/Significant Other  clopidogrel (PLAVIX) 75 MG tablet [id number]  TAKE 1 TABLET BY MOUTH ONCE DAILY WITH BREAKFAST. Satira Sark, MD  Active Spouse/Significant Other  Continuous Blood Gluc Sensor (FREESTYLE LIBRE 14 DAY SENSOR) [place] [id number]  Inject 1 patch into the skin every 14 (fourteen) days. [provider]  Active Spouse/Significant Other  CREON 36000 units CPEP capsule [id number]  TAKE 1 CAPSULE BY MOUTH BEFORE EACH MEAL AND SNACK. TAKE UP TO 6 CAPSULES PER DAY. Ladell Pier, MD  Active Spouse/Significant Other  diphenoxylate-atropine (LOMOTIL) 2.5-0.025 MG tablet [id number]  Take 1 tablet by mouth 4 (four) times daily as needed for diarrhea or loose stools. Jonetta Osgood, MD  Active   ferrous sulfate 325 (65 FE) MG tablet [id number]  Take 650 mg by mouth daily with breakfast. [provider]  Active Spouse/Significant Other  Insulin Glargine (BASAGLAR KWIKPEN) 100 UNIT/ML [id number]  Inject 8 Units into the skin See admin instructions. Inject 8 units into the skin in the morning, WHEN EATING [provider]  Active Spouse/Significant Other  insulin lispro (HUMALOG KWIKPEN) 100 UNIT/ML KwikPen [id number]  Inject 5-8 Units into the skin 3 (three) times daily before meals. Inject 5-8 units 3 times a day with meals. Philemon Kingdom, MD  Active  Spouse/Significant Other  Insulin Pen Needle (PEN NEEDLES) 31G X 6 MM MISC [id number]  Use as directed to check blood sugar 4 times a day. Philemon Kingdom, MD  Active Spouse/Significant Other  losartan (COZAAR) 25 MG tablet [id number]  Take 0.5 tablets (12.5 mg total) by mouth daily. Ahmed Prima [place], PA-C  Active Spouse/Significant Other  megestrol (MEGACE) 40 MG/ML suspension [id number]  Take 200 mg by mouth 3 (three) times daily before meals. [provider]  Active Spouse/Significant Other  nitroGLYCERIN (NITROSTAT) 0.4 MG SL tablet [id number]  Place 1 tablet (0.4 mg total) under the tongue every 5 (five) minutes x 3 doses as needed for chest pain (if no relief after 2nd dose, proceed to the ED for an evaluation or call 911). Satira Sark, MD  Active Spouse/Significant Other           Med Note [place], Para Skeans Mar 04, 2021  9:47 PM)    pantoprazole (PROTONIX) 40 MG tablet [id number]  Take 1 tablet (40 mg total) by mouth 2 (two) times daily. [name], MD  Active Spouse/Significant Other           Patient Active Problem List   Diagnosis Date Noted   Pressure injury of skin 03/07/2021   COVID-19 virus infection 03/05/2021   COVID-19 03/04/2021   Generalized weakness 03/04/2021   Confusion 03/04/2021   Renal cell carcinoma ([place]) 08/13/2020   Anastomotic ulcer 01/21/2020   Long term (current) use of antithrombotics/antiplatelets 01/21/2020   Upper gastrointestinal bleed 12/31/2019   Acute GI bleeding 12/30/2019   Angina pectoris ([place]) 08/22/2019   Protein-calorie malnutrition, severe 04/05/2018   Debility 02/27/2018   Port-A-Cath in place 10/10/2017   Renal cell carcinoma of right kidney [facility]) s/p nephrectomy 02/2018 10/02/2017   Ileitis, terminal ([place]) 09/28/2017   Hyponatremia 09/27/2017   Insulin dependent type 2 diabetes mellitus ([place]) 08/25/2017   Mixed hyperlipidemia 08/25/2017   Essential hypertension, benign    CAD (coronary artery disease)     Former smoker    Severe AS S/P TAVR  08/22/2017   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  Conditions to be addressed/monitored: CAD, HTN, HLD, DMII and CKD  III  Care Plan : General Pharmacy (Adult)  Updates made by Edythe Clarity, RPH since 04/06/2021 12:00 AM     Problem: CAD, HTN, HLD, DMII and CKD III   Priority: High     Long-Range Goal: Patient-Specific Goal   Start Date: 12/01/2020  Expected End Date: 12/01/2021  Recent Progress: On track  Priority: High  Note:   Current Barriers:  None identified at this visit  Pharmacist Clinical Goal(s):  Patient will maintain control of BP and glucose as evidenced by home monitoring  contact provider office for questions/concerns as evidenced notation of same in electronic health record through collaboration with PharmD and provider.   Interventions: 1:1 collaboration with [name], MD regarding development and update of comprehensive plan of care as evidenced by provider attestation and co-signature Inter-disciplinary care team collaboration (see longitudinal plan of care) Comprehensive medication review performed; medication list updated in electronic medical record   Hypertension (BP goal <130/80) -Controlled -Current treatment: No meds at this time -Medications previously tried: lisinopril 10 mg, losartan 25 mg, carvedilol -Current home readings: has not checked in a while -Denies hypotensive/hypertensive symptoms -Educated on BP goals and benefits of medications for prevention of heart attack, stroke and kidney damage; Exercise goal of 150 minutes per week; -Counseled to monitor BP at home, document, and provide log at future appointments -Counseled on diet and exercise extensively  Update 04/06/21 BP controlled Wife has not been monitoring it at home like she was, recommend they start this back at least 2-3x per week Goal  < 130/80 - not currently taking any BP meds  Continue current management for now, increase home monitoring  Hyperlipidemia: (LDL goal < 70) -Controlled per 09/2019 results, due for f/u labs with PCP -CKD, DMII, CAD (11/2016 STEMI s/p DES to mid LAD, DES to diagonal, DES x 2 to proximal and mid RCA); former smoker -Current treatment: Atorvastatin 20 mg once daily  -Medications previously tried: Atorvastatin 40 mg once daily   -Educated on Cholesterol goals;  -Recommended to continue current medication  Update 04/06/21 Still adherent with medication Had not had updated lipid  panel since 09/2019 Recommend repeat lipid panel next OV  Diabetes (A1c goal 7.5%) -Controlled  -GFR 40s -Cancer of head of pancreas ([place]) 2018 s/p Whipple 02/2018; Renal cell carcinoma of right kidney [facility]) s/p nephrectomy 02/2018 -Current medications: Basal: Basaglar 100 unit/mL 8 units injected into the skin once each morning Bolus:Humalog 3-4 units before each meal, but use 5 units with larger meal  -Current home glucose readings fasting glucose: 157, 125, 101 post prandial glucose: did not obtain  -Denies frequent hypoglycemic/hyperglycemic symptoms. Of note, 11/30/2020: BG of 55 reported yesterday after injecting 5 units mealtime insulin, wife states this was injected roughly 30 minutes before lunch and is not a routine occurrence. -Current meal patterns:  breakfast: egg salad  lunch: soup or sandwich  dinner: chili snacks: minimal -Current exercise: no routine exercise, will walk on occasion with wife. -Educated on A1c and blood sugar goals; Proper insulin injection technique; Prevention and management of hypoglycemic episodes; discussed libre 2 cgm as a potentially useful option given realtime alarms for lows/highs - they will talk with endocrinology  -Counseled on diet and exercise extensively Recommended to continue current medication Assessed patient finances. states cost of insulin are not an issue at  this time  Update 04/06/21 Endocrine recently adjusted insulin doses as listed above His sugars have all been between 100-160s - denies any recent hypoglycemia! Wife reports his appetite is increasing, no longer taking megestrol  Counseled on injections 15 minutes before meals to avoid hypoglycemia Insulin price remains manageable  Continue current meds   Health Maintenance Consider DEXA- BMI < 20, age >4, long term PPI, limited exercise  Patient Goals/Self-Care Activities Over the next 365 days, patient will:  - take medications as prescribed check glucose multiples times per day via CGM, document, and provide at future appointments  Medication Assistance: None required.  Patient affirms current coverage meets needs.  Patient's preferred pharmacy is:  [place], [place] - [address] [place] [address] Phone: [phone] Fax: [phone]        Current Barriers:  Occasional low blood glucose readings  Pharmacist Clinical Goal(s):  Over the next 365 days, patient will verbalize ability to afford treatment regimen contact provider office for questions/concerns as evidenced notation of same in electronic health record through collaboration with PharmD and provider.   Interventions: 1:1 collaboration with [name], MD regarding development and update of comprehensive plan of care as evidenced by provider attestation and co-signature Inter-disciplinary care team collaboration (see longitudinal plan of care) Comprehensive medication review performed; medication list updated in electronic medical record No med changes/recommendations at this time Consider DEXA- BMI < 20, age >25, long term PPI, limited exercise  Follow Up:  Patient agrees to Care Plan and Follow-up. Plan:  [place] f/u telephone call 4 months. DM call 1 month. Future Appointments  Date Time Provider [place]  04/20/2021 10:45 AM [facility]  PHLEBOTOMIST [facility] None  04/20/2021 11:20 AM Ladell Pier, MD [facility] None  07/26/2021 10:40 AM Philemon Kingdom, MD [facility] None  08/03/2021 10:00 AM [facility] None  08/13/2021 10:00 AM McKenzie, Candee [place]urb[place], MD [facility] None   Beverly Milch, PharmD Clinical Pharmacist [phone]

## 2021-04-06 NOTE — Patient Instructions (Addendum)
Visit Information   Goals Addressed             This Visit's Progress    Track and Manage My Blood Pressure-Hypertension       Timeframe:  Long-Range Goal Priority:  High Start Date:    04/06/21                         Expected End Date:   10/07/21                    Follow Up Date 07/12/21    - check blood pressure 3 times per week - choose a place to take my blood pressure (home, clinic or office, retail store) - write blood pressure results in a log or diary    Why is this important?   You won't feel high blood pressure, but it can still hurt your blood vessels.  High blood pressure can cause heart or kidney problems. It can also cause a stroke.  Making lifestyle changes like losing a little weight or eating less salt will help.  Checking your blood pressure at home and at different times of the day can help to control blood pressure.  If the doctor prescribes medicine remember to take it the way the doctor ordered.  Call the office if you cannot afford the medicine or if there are questions about it.     Notes:        Patient Care Plan: General Pharmacy (Adult)     Problem Identified: CAD, HTN, HLD, DMII and CKD III   Priority: High     Long-Range Goal: Patient-Specific Goal   Start Date: 12/01/2020  Expected End Date: 12/01/2021  Recent Progress: On track  Priority: High  Note:   Current Barriers:  None identified at this visit  Pharmacist Clinical Goal(s):  Patient will maintain control of BP and glucose as evidenced by home monitoring  contact provider office for questions/concerns as evidenced notation of same in electronic health record through collaboration with PharmD and provider.   Interventions: 1:1 collaboration with [name], MD regarding development and update of comprehensive plan of care as evidenced by provider attestation and co-signature Inter-disciplinary care team collaboration (see longitudinal plan of care) Comprehensive  medication review performed; medication list updated in electronic medical record   Hypertension (BP goal <130/80) -Controlled -Current treatment: No meds at this time -Medications previously tried: lisinopril 10 mg, losartan 25 mg, carvedilol -Current home readings: has not checked in a while -Denies hypotensive/hypertensive symptoms -Educated on BP goals and benefits of medications for prevention of heart attack, stroke and kidney damage; Exercise goal of 150 minutes per week; -Counseled to monitor BP at home, document, and provide log at future appointments -Counseled on diet and exercise extensively  Update 04/06/21 BP controlled Wife has not been monitoring it at home like she was, recommend they start this back at least 2-3x per week Goal < 130/80 - not currently taking any BP meds  Continue current management for now, increase home monitoring  Hyperlipidemia: (LDL goal < 70) -Controlled per 09/2019 results, due for f/u labs with PCP -CKD, DMII, CAD (11/2016 STEMI s/p DES to mid LAD, DES to diagonal, DES x 2 to proximal and mid RCA); former smoker -Current treatment: Atorvastatin 20 mg once daily  -Medications previously tried: Atorvastatin 40 mg once daily   -Educated on Cholesterol goals;  -Recommended to continue current medication  Update 04/06/21 Still adherent with  medication Had not had updated lipid panel since 09/2019 Recommend repeat lipid panel next OV  Diabetes (A1c goal 7.5%) -Controlled  -GFR 40s -Cancer of head of pancreas ([place]) 2018 s/p Whipple 02/2018; Renal cell carcinoma of right kidney [facility]) s/p nephrectomy 02/2018 -Current medications: Basal: Basaglar 100 unit/mL 8 units injected into the skin once each morning Bolus:Humalog 3-4 units before each meal, but use 5 units with larger meal  -Current home glucose readings fasting glucose: 157, 125, 101 post prandial glucose: did not obtain  -Denies frequent hypoglycemic/hyperglycemic symptoms. Of note,  11/30/2020: BG of 55 reported yesterday after injecting 5 units mealtime insulin, wife states this was injected roughly 30 minutes before lunch and is not a routine occurrence. -Current meal patterns:  breakfast: egg salad  lunch: soup or sandwich  dinner: chili snacks: minimal -Current exercise: no routine exercise, will walk on occasion with wife. -Educated on A1c and blood sugar goals; Proper insulin injection technique; Prevention and management of hypoglycemic episodes; discussed libre 2 cgm as a potentially useful option given realtime alarms for lows/highs - they will talk with endocrinology  -Counseled on diet and exercise extensively Recommended to continue current medication Assessed patient finances. states cost of insulin are not an issue at this time  Update 04/06/21 Endocrine recently adjusted insulin doses as listed above His sugars have all been between 100-160s - denies any recent hypoglycemia! Wife reports his appetite is increasing, no longer taking megestrol  Counseled on injections 15 minutes before meals to avoid hypoglycemia Insulin price remains manageable  Continue current meds   Health Maintenance Consider DEXA- BMI < 20, age >47, long term PPI, limited exercise  Patient Goals/Self-Care Activities Over the next 365 days, patient will:  - take medications as prescribed check glucose multiples times per day via CGM, document, and provide at future appointments  Medication Assistance: None required.  Patient affirms current coverage meets needs.  Patient's preferred pharmacy is:  [place], [place] - [address] [place] [address] Phone: [phone] Fax: [phone]         Patient verbalizes understanding of instructions provided today and agrees to view in [place].  Telephone follow up appointment with pharmacy team member scheduled for: 4 months  Edythe Clarity, [place]

## 2021-04-13 DIAGNOSIS — R2689 Other abnormalities of gait and mobility: Secondary | ICD-10-CM | POA: Diagnosis not present

## 2021-04-13 DIAGNOSIS — R531 Weakness: Secondary | ICD-10-CM | POA: Diagnosis not present

## 2021-04-20 ENCOUNTER — Other Ambulatory Visit: Payer: Self-pay

## 2021-04-20 ENCOUNTER — Inpatient Hospital Stay: Payer: Medicare Other | Attending: Nurse Practitioner

## 2021-04-20 ENCOUNTER — Inpatient Hospital Stay (HOSPITAL_BASED_OUTPATIENT_CLINIC_OR_DEPARTMENT_OTHER): Payer: Medicare Other | Admitting: Oncology

## 2021-04-20 VITALS — BP 102/58 | HR 87 | Temp 97.8°F | Resp 18 | Ht 69.0 in | Wt 116.8 lb

## 2021-04-20 DIAGNOSIS — D696 Thrombocytopenia, unspecified: Secondary | ICD-10-CM | POA: Insufficient documentation

## 2021-04-20 DIAGNOSIS — C250 Malignant neoplasm of head of pancreas: Secondary | ICD-10-CM | POA: Diagnosis not present

## 2021-04-20 DIAGNOSIS — K922 Gastrointestinal hemorrhage, unspecified: Secondary | ICD-10-CM | POA: Diagnosis not present

## 2021-04-20 LAB — CBC WITH DIFFERENTIAL (CANCER CENTER ONLY)
Abs Immature Granulocytes: 0.07 10*3/uL (ref 0.00–0.07)
Basophils Absolute: 0 10*3/uL (ref 0.0–0.1)
Basophils Relative: 0 %
Eosinophils Absolute: 0 10*3/uL (ref 0.0–0.5)
Eosinophils Relative: 0 %
HCT: 28.3 % — ABNORMAL LOW (ref 39.0–52.0)
Hemoglobin: 9.2 g/dL — ABNORMAL LOW (ref 13.0–17.0)
Immature Granulocytes: 1 %
Lymphocytes Relative: 7 %
Lymphs Abs: 0.7 10*3/uL (ref 0.7–4.0)
MCH: 29.9 pg (ref 26.0–34.0)
MCHC: 32.5 g/dL (ref 30.0–36.0)
MCV: 91.9 fL (ref 80.0–100.0)
Monocytes Absolute: 1.1 10*3/uL — ABNORMAL HIGH (ref 0.1–1.0)
Monocytes Relative: 11 %
Neutro Abs: 8.4 10*3/uL — ABNORMAL HIGH (ref 1.7–7.7)
Neutrophils Relative %: 81 %
Platelet Count: 185 10*3/uL (ref 150–400)
RBC: 3.08 MIL/uL — ABNORMAL LOW (ref 4.22–5.81)
RDW: 15.7 % — ABNORMAL HIGH (ref 11.5–15.5)
WBC Count: 10.4 10*3/uL (ref 4.0–10.5)
nRBC: 0 % (ref 0.0–0.2)

## 2021-04-20 LAB — FERRITIN: Ferritin: 259 ng/mL (ref 24–336)

## 2021-04-20 NOTE — Progress Notes (Signed)
[place] OFFICE PROGRESS NOTE   Diagnosis: Pancreas cancer  INTERVAL HISTORY:   Manuel Olson returns as scheduled.  He is here with his wife.  He reports a good appetite.  He has chronic back pain.  His chief complaint is diarrhea.  This occurs following meals and at other times.  He reports Dr. Barry Dienes recently increase the Creon dose.  This has not helped.  He has dark stool, but no gross bleeding.  He is taking iron. He was admitted in June with COVID-19 infection.  Objective:  Vital signs in last 24 hours:  Blood pressure (!) 102/58, pulse 87, temperature 97.8 F (36.6 C), temperature source Oral, resp. rate 18, height 5' 9"  (1.753 m), weight 116 lb 12.8 oz (53 kg), SpO2 100 %.     Lymphatics: No cervical, supraclavicular, axillary, or inguinal nodes Resp: End inspiratory rhonchi at the posterior chest, no respiratory distress Cardio: Regular rate and rhythm GI: Mild tenderness in the mid lateral right abdomen, no mass, no hepatosplenomegaly, no apparent ascites Vascular: No leg edema   Lab Results:  Lab Results  Component Value Date   WBC 10.4 04/20/2021   HGB 9.2 (L) 04/20/2021   HCT 28.3 (L) 04/20/2021   MCV 91.9 04/20/2021   PLT 185 04/20/2021   NEUTROABS 8.4 (H) 04/20/2021    CMP  Lab Results  Component Value Date   NA 130 (L) 03/07/2021   K 3.9 03/07/2021   CL 105 03/07/2021   CO2 19 (L) 03/07/2021   GLUCOSE 131 (H) 03/07/2021   BUN 20 03/07/2021   CREATININE 1.30 (H) 03/07/2021   CALCIUM 8.6 (L) 03/07/2021   PROT 6.1 (L) 03/04/2021   ALBUMIN 3.0 (L) 03/04/2021   AST 22 03/04/2021   ALT 19 03/04/2021   ALKPHOS 89 03/04/2021   BILITOT 0.5 03/04/2021   GFRNONAA 57 (L) 03/07/2021   GFRAA 45 (L) 01/09/2020     Medications: I have reviewed the patient's current medications.   Assessment/Plan:  Adenocarcinoma of the pancreas head/neck, status post an EUS biopsy 08/02/2017 confirming adenocarcinoma EUS 08/02/2017-pancreas  head/neck mass, abutment of the portal vein, no peripancreatic adenopathy MRI abdomen 07/27/2017- head/neck pancreas mass with mass-effect on the portal splenic confluence, no involvement of the celiac axis or superior mesenteric artery, no evidence of metastatic disease PET scan 08/31/2017-very hypermetabolism at the pancreatic head mass, no evidence of metastatic disease.  The isolated right lower lobe nodule below PET resolution, left sided renal mass with decreased activity relative to the normal adjacent kidney Cycle 1 FOLFIRINOX 09/16/2017 Cycle 2 FOLFOX 10/16/2017 Cycle 3 FOLFOX 10/30/2017 Cycle 4 FOLFIRINOX 11/14/2017 (irinotecan resumed with a dose reduction) Cycle 5 FOLFIRINOX 11/29/2017 (Oxaliplatin dose reduced due to thrombocytopenia) Cycle 6 FOLFIRINOX 12/13/2017 CTs 12/25/2017-stable right lower lobe nodule, decrease in size of the pancreas head mass, stable left kidney mass Pancreaticoduodenectomy and left nephrectomy 02/19/2018,[id number], 2/18 lymph nodes positive, lymphovascular and perineural invasion present, mild to moderate treatment effect, tumor involved adherent portal vein with negative portal vein margins Cycle 1 adjuvant gemcitabine/Abraxane 04/25/2018 Cycle 2 adjuvant gemcitabine/Abraxane 05/16/2018 Cycle 3 adjuvant gemcitabine/Abraxane 05/31/2018 Cycle 4 adjuvant gemcitabine/Abraxane 06/13/2018 Cycle 5 adjuvant gemcitabine/Abraxane 06/27/2018 Cycle 6 adjuvant gemcitabine/Abraxane 07/11/2018   Left renal mass consistent with a renal cell carcinoma seen on CT 07/21/2017 and MRI 07/28/2017 Left nephrectomy 02/19/2018- 6 cm clear cell carcinoma, Fuhrman grade 2, negative resection margins   3.   Severe aortic stenosis-TAVR 08/22/2017   4.   Coronary artery disease, status post myocardial infarction  March 2018, status post placement of coronary stents; MI December 2020 status post placement of a new stent.   5.   Diabetes   6.   History of tobacco use   7.   Hypertension   8.    Port-A-Cath placement 09/15/2017, Dr. Barry Dienes   9.  Diarrhea- potentially related to pancreas insufficiency, he began a trial of pancreatic enzyme replacement 05/16/2018: Diarrhea resolved   10.  Dehydration on 03/19/2018-status treatments with IV fluids for past 2 weeks Admission 04/03/2018 with dehydration and hypotension Persistent orthostatic hypotension- compression stockings and Florinef added 04/10/2018 improved, Florinef discontinued   11.  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  Renal insufficiency   13.  Mild thrombocytopenia-chronic   14.  Hospitalization with GI bleed 12/30/2019 through 01/05/2020.  Upper endoscopy on 01/02/2020 showed an actively bleeding anastomotic ulcer.  Upper endoscopy 03/06/2020-ulcer completely healed.       Disposition: Mr Beagle remains in clinical remission from pancreas cancer.  We will follow-up on the CA 19-9 from today.  I suspect the diarrhea and weight loss are related to the pancreaticoduodenectomy.  He is more anemic today.  I am concerned he may have a recurrent ulcer.  He will return stool Hemoccult cards.  He will return for an office visit and CBC in 1 week.  We will follow-up on the ferritin level from today.  [name], MD  04/20/2021  12:08 PM

## 2021-04-21 DIAGNOSIS — R2689 Other abnormalities of gait and mobility: Secondary | ICD-10-CM | POA: Diagnosis not present

## 2021-04-21 DIAGNOSIS — R531 Weakness: Secondary | ICD-10-CM | POA: Diagnosis not present

## 2021-04-21 LAB — CANCER ANTIGEN 19-9: CA 19-9: 34 U/mL (ref 0–35)

## 2021-04-26 ENCOUNTER — Inpatient Hospital Stay (HOSPITAL_BASED_OUTPATIENT_CLINIC_OR_DEPARTMENT_OTHER): Payer: Medicare Other | Admitting: Nurse Practitioner

## 2021-04-26 ENCOUNTER — Inpatient Hospital Stay (HOSPITAL_BASED_OUTPATIENT_CLINIC_OR_DEPARTMENT_OTHER)
Admission: EM | Admit: 2021-04-26 | Discharge: 2021-05-03 | DRG: 377 | Disposition: A | Discharge status: Home / Self Care | Payer: Medicare Other | Attending: Internal Medicine | Admitting: Internal Medicine

## 2021-04-26 ENCOUNTER — Other Ambulatory Visit: Payer: Self-pay

## 2021-04-26 ENCOUNTER — Encounter: Payer: Self-pay | Admitting: Nurse Practitioner

## 2021-04-26 ENCOUNTER — Emergency Department (HOSPITAL_BASED_OUTPATIENT_CLINIC_OR_DEPARTMENT_OTHER): Payer: Medicare Other

## 2021-04-26 ENCOUNTER — Inpatient Hospital Stay: Payer: Medicare Other

## 2021-04-26 ENCOUNTER — Telehealth: Payer: Self-pay

## 2021-04-26 ENCOUNTER — Encounter (HOSPITAL_BASED_OUTPATIENT_CLINIC_OR_DEPARTMENT_OTHER): Payer: Self-pay

## 2021-04-26 VITALS — BP 94/63 | HR 55 | Temp 97.8°F | Resp 20 | Ht 69.0 in | Wt 112.0 lb

## 2021-04-26 DIAGNOSIS — Z8679 Personal history of other diseases of the circulatory system: Secondary | ICD-10-CM

## 2021-04-26 DIAGNOSIS — N1831 Chronic kidney disease, stage 3a: Secondary | ICD-10-CM | POA: Diagnosis present

## 2021-04-26 DIAGNOSIS — Z8673 Personal history of transient ischemic attack (TIA), and cerebral infarction without residual deficits: Secondary | ICD-10-CM

## 2021-04-26 DIAGNOSIS — L89151 Pressure ulcer of sacral region, stage 1: Secondary | ICD-10-CM | POA: Diagnosis present

## 2021-04-26 DIAGNOSIS — C250 Malignant neoplasm of head of pancreas: Secondary | ICD-10-CM | POA: Diagnosis present

## 2021-04-26 DIAGNOSIS — D649 Anemia, unspecified: Secondary | ICD-10-CM | POA: Diagnosis present

## 2021-04-26 DIAGNOSIS — R64 Cachexia: Secondary | ICD-10-CM | POA: Diagnosis present

## 2021-04-26 DIAGNOSIS — D62 Acute posthemorrhagic anemia: Secondary | ICD-10-CM | POA: Diagnosis present

## 2021-04-26 DIAGNOSIS — J439 Emphysema, unspecified: Secondary | ICD-10-CM | POA: Diagnosis not present

## 2021-04-26 DIAGNOSIS — Z8719 Personal history of other diseases of the digestive system: Secondary | ICD-10-CM | POA: Diagnosis not present

## 2021-04-26 DIAGNOSIS — K921 Melena: Secondary | ICD-10-CM | POA: Diagnosis not present

## 2021-04-26 DIAGNOSIS — E43 Unspecified severe protein-calorie malnutrition: Secondary | ICD-10-CM | POA: Diagnosis present

## 2021-04-26 DIAGNOSIS — R0602 Shortness of breath: Secondary | ICD-10-CM | POA: Diagnosis not present

## 2021-04-26 DIAGNOSIS — I2510 Atherosclerotic heart disease of native coronary artery without angina pectoris: Secondary | ICD-10-CM | POA: Diagnosis present

## 2021-04-26 DIAGNOSIS — Z87891 Personal history of nicotine dependence: Secondary | ICD-10-CM

## 2021-04-26 DIAGNOSIS — E1122 Type 2 diabetes mellitus with diabetic chronic kidney disease: Secondary | ICD-10-CM | POA: Diagnosis present

## 2021-04-26 DIAGNOSIS — Z905 Acquired absence of kidney: Secondary | ICD-10-CM

## 2021-04-26 DIAGNOSIS — Z85528 Personal history of other malignant neoplasm of kidney: Secondary | ICD-10-CM

## 2021-04-26 DIAGNOSIS — R1031 Right lower quadrant pain: Secondary | ICD-10-CM | POA: Diagnosis not present

## 2021-04-26 DIAGNOSIS — Z8616 Personal history of COVID-19: Secondary | ICD-10-CM | POA: Diagnosis not present

## 2021-04-26 DIAGNOSIS — Z974 Presence of external hearing-aid: Secondary | ICD-10-CM

## 2021-04-26 DIAGNOSIS — Z681 Body mass index (BMI) 19 or less, adult: Secondary | ICD-10-CM

## 2021-04-26 DIAGNOSIS — Z79899 Other long term (current) drug therapy: Secondary | ICD-10-CM

## 2021-04-26 DIAGNOSIS — R531 Weakness: Secondary | ICD-10-CM

## 2021-04-26 DIAGNOSIS — Z7902 Long term (current) use of antithrombotics/antiplatelets: Secondary | ICD-10-CM

## 2021-04-26 DIAGNOSIS — Z8507 Personal history of malignant neoplasm of pancreas: Secondary | ICD-10-CM | POA: Diagnosis not present

## 2021-04-26 DIAGNOSIS — E222 Syndrome of inappropriate secretion of antidiuretic hormone: Secondary | ICD-10-CM | POA: Diagnosis present

## 2021-04-26 DIAGNOSIS — E119 Type 2 diabetes mellitus without complications: Secondary | ICD-10-CM

## 2021-04-26 DIAGNOSIS — U071 COVID-19: Secondary | ICD-10-CM | POA: Diagnosis not present

## 2021-04-26 DIAGNOSIS — I10 Essential (primary) hypertension: Secondary | ICD-10-CM | POA: Diagnosis present

## 2021-04-26 DIAGNOSIS — N179 Acute kidney failure, unspecified: Secondary | ICD-10-CM | POA: Diagnosis not present

## 2021-04-26 DIAGNOSIS — K219 Gastro-esophageal reflux disease without esophagitis: Secondary | ICD-10-CM | POA: Diagnosis present

## 2021-04-26 DIAGNOSIS — I252 Old myocardial infarction: Secondary | ICD-10-CM

## 2021-04-26 DIAGNOSIS — E861 Hypovolemia: Secondary | ICD-10-CM | POA: Diagnosis not present

## 2021-04-26 DIAGNOSIS — K284 Chronic or unspecified gastrojejunal ulcer with hemorrhage: Secondary | ICD-10-CM | POA: Diagnosis present

## 2021-04-26 DIAGNOSIS — E785 Hyperlipidemia, unspecified: Secondary | ICD-10-CM | POA: Diagnosis present

## 2021-04-26 DIAGNOSIS — K922 Gastrointestinal hemorrhage, unspecified: Secondary | ICD-10-CM | POA: Diagnosis not present

## 2021-04-26 DIAGNOSIS — I5022 Chronic systolic (congestive) heart failure: Secondary | ICD-10-CM | POA: Diagnosis present

## 2021-04-26 DIAGNOSIS — R42 Dizziness and giddiness: Secondary | ICD-10-CM | POA: Diagnosis not present

## 2021-04-26 DIAGNOSIS — M479 Spondylosis, unspecified: Secondary | ICD-10-CM | POA: Diagnosis present

## 2021-04-26 DIAGNOSIS — E871 Hypo-osmolality and hyponatremia: Secondary | ICD-10-CM | POA: Diagnosis not present

## 2021-04-26 DIAGNOSIS — E1165 Type 2 diabetes mellitus with hyperglycemia: Secondary | ICD-10-CM | POA: Diagnosis not present

## 2021-04-26 DIAGNOSIS — Z952 Presence of prosthetic heart valve: Secondary | ICD-10-CM

## 2021-04-26 DIAGNOSIS — I130 Hypertensive heart and chronic kidney disease with heart failure and stage 1 through stage 4 chronic kidney disease, or unspecified chronic kidney disease: Secondary | ICD-10-CM | POA: Diagnosis present

## 2021-04-26 DIAGNOSIS — Z8249 Family history of ischemic heart disease and other diseases of the circulatory system: Secondary | ICD-10-CM

## 2021-04-26 DIAGNOSIS — Z832 Family history of diseases of the blood and blood-forming organs and certain disorders involving the immune mechanism: Secondary | ICD-10-CM

## 2021-04-26 DIAGNOSIS — Z90411 Acquired partial absence of pancreas: Secondary | ICD-10-CM

## 2021-04-26 DIAGNOSIS — Z888 Allergy status to other drugs, medicaments and biological substances status: Secondary | ICD-10-CM

## 2021-04-26 DIAGNOSIS — Z955 Presence of coronary angioplasty implant and graft: Secondary | ICD-10-CM

## 2021-04-26 DIAGNOSIS — Z794 Long term (current) use of insulin: Secondary | ICD-10-CM | POA: Diagnosis not present

## 2021-04-26 DIAGNOSIS — I25119 Atherosclerotic heart disease of native coronary artery with unspecified angina pectoris: Secondary | ICD-10-CM | POA: Diagnosis not present

## 2021-04-26 DIAGNOSIS — K254 Chronic or unspecified gastric ulcer with hemorrhage: Secondary | ICD-10-CM | POA: Diagnosis present

## 2021-04-26 DIAGNOSIS — R5381 Other malaise: Secondary | ICD-10-CM

## 2021-04-26 LAB — CBC WITH DIFFERENTIAL/PLATELET
Abs Immature Granulocytes: 0 10*3/uL (ref 0.00–0.07)
Basophils Absolute: 0 10*3/uL (ref 0.0–0.1)
Basophils Relative: 0 %
Eosinophils Absolute: 0 10*3/uL (ref 0.0–0.5)
Eosinophils Relative: 1 %
HCT: 18.2 % — ABNORMAL LOW (ref 39.0–52.0)
Hemoglobin: 5.9 g/dL — CL (ref 13.0–17.0)
Immature Granulocytes: 0 %
Lymphocytes Relative: 8 %
Lymphs Abs: 0.9 10*3/uL (ref 0.7–4.0)
MCH: 29.6 pg (ref 26.0–34.0)
MCHC: 32.4 g/dL (ref 30.0–36.0)
MCV: 91.5 fL (ref 80.0–100.0)
Monocytes Absolute: 1.1 10*3/uL — ABNORMAL HIGH (ref 0.1–1.0)
Monocytes Relative: 9 %
Neutro Abs: 10.1 10*3/uL — ABNORMAL HIGH (ref 1.7–7.7)
Neutrophils Relative %: 82 %
Platelets: 348 10*3/uL (ref 150–400)
RBC: 1.99 MIL/uL — ABNORMAL LOW (ref 4.22–5.81)
RDW: 15.9 % — ABNORMAL HIGH (ref 11.5–15.5)
WBC: 12.3 10*3/uL — ABNORMAL HIGH (ref 4.0–10.5)
nRBC: 0 % (ref 0.0–0.2)

## 2021-04-26 LAB — CBC WITH DIFFERENTIAL (CANCER CENTER ONLY)
Abs Immature Granulocytes: 0.09 10*3/uL — ABNORMAL HIGH (ref 0.00–0.07)
Basophils Absolute: 0.1 10*3/uL (ref 0.0–0.1)
Basophils Relative: 0 %
Eosinophils Absolute: 0 10*3/uL (ref 0.0–0.5)
Eosinophils Relative: 0 %
HCT: 17.8 % — ABNORMAL LOW (ref 39.0–52.0)
Hemoglobin: 5.9 g/dL — CL (ref 13.0–17.0)
Immature Granulocytes: 1 %
Lymphocytes Relative: 8 %
Lymphs Abs: 1 10*3/uL (ref 0.7–4.0)
MCH: 29.9 pg (ref 26.0–34.0)
MCHC: 33.1 g/dL (ref 30.0–36.0)
MCV: 90.4 fL (ref 80.0–100.0)
Monocytes Absolute: 1.2 10*3/uL — ABNORMAL HIGH (ref 0.1–1.0)
Monocytes Relative: 9 %
Neutro Abs: 11.2 10*3/uL — ABNORMAL HIGH (ref 1.7–7.7)
Neutrophils Relative %: 82 %
Platelet Count: 358 10*3/uL (ref 150–400)
RBC: 1.97 MIL/uL — ABNORMAL LOW (ref 4.22–5.81)
RDW: 15.9 % — ABNORMAL HIGH (ref 11.5–15.5)
WBC Count: 13.6 10*3/uL — ABNORMAL HIGH (ref 4.0–10.5)
nRBC: 0 % (ref 0.0–0.2)

## 2021-04-26 LAB — COMPREHENSIVE METABOLIC PANEL
ALT: 26 U/L (ref 0–44)
AST: 17 U/L (ref 15–41)
Albumin: 3.1 g/dL — ABNORMAL LOW (ref 3.5–5.0)
Alkaline Phosphatase: 194 U/L — ABNORMAL HIGH (ref 38–126)
Anion gap: 10 (ref 5–15)
BUN: 30 mg/dL — ABNORMAL HIGH (ref 8–23)
CO2: 20 mmol/L — ABNORMAL LOW (ref 22–32)
Calcium: 8.8 mg/dL — ABNORMAL LOW (ref 8.9–10.3)
Chloride: 100 mmol/L (ref 98–111)
Creatinine, Ser: 1.27 mg/dL — ABNORMAL HIGH (ref 0.61–1.24)
GFR, Estimated: 58 mL/min — ABNORMAL LOW (ref >60)
Glucose, Bld: 193 mg/dL — ABNORMAL HIGH (ref 70–99)
Potassium: 4.9 mmol/L (ref 3.5–5.1)
Sodium: 130 mmol/L — ABNORMAL LOW (ref 135–145)
Total Bilirubin: 0.5 mg/dL (ref 0.3–1.2)
Total Protein: 6.1 g/dL — ABNORMAL LOW (ref 6.5–8.1)

## 2021-04-26 LAB — LIPASE, BLOOD: Lipase: <10 U/L — ABNORMAL LOW (ref 11–51)

## 2021-04-26 LAB — PREPARE RBC (CROSSMATCH)

## 2021-04-26 LAB — RESP PANEL BY RT-PCR (FLU A&B, COVID) ARPGX2
Influenza A by PCR: NEGATIVE
Influenza B by PCR: NEGATIVE
SARS Coronavirus 2 by RT PCR: POSITIVE — AB

## 2021-04-26 LAB — SAMPLE TO BLOOD BANK

## 2021-04-26 LAB — PROTIME-INR
INR: 1.1 (ref 0.8–1.2)
Prothrombin Time: 14.5 seconds (ref 11.4–15.2)

## 2021-04-26 LAB — TROPONIN I (HIGH SENSITIVITY)
Troponin I (High Sensitivity): 31 ng/L — ABNORMAL HIGH (ref <18)
Troponin I (High Sensitivity): 29 ng/L — ABNORMAL HIGH (ref <18)

## 2021-04-26 LAB — BRAIN NATRIURETIC PEPTIDE: B Natriuretic Peptide: 125 pg/mL — ABNORMAL HIGH (ref 0.0–100.0)

## 2021-04-26 LAB — OCCULT BLOOD X 1 CARD TO LAB, STOOL
Fecal Occult Bld: POSITIVE — AB
Fecal Occult Bld: POSITIVE — AB
Fecal Occult Bld: POSITIVE — AB

## 2021-04-26 MED ORDER — PANTOPRAZOLE INFUSION (NEW) - SIMPLE MED
8.0000 mg/h | INTRAVENOUS | Status: DC
  Administered 2021-04-26 – 2021-04-29 (×7): 8 mg/h via INTRAVENOUS
  Filled 2021-04-26: qty 100
  Filled 2021-04-26 (×3): qty 80
  Filled 2021-04-26: qty 100
  Filled 2021-04-26 (×3): qty 80

## 2021-04-26 MED ORDER — ACETAMINOPHEN 650 MG RE SUPP: 650.0000 mg | Freq: Four times a day (QID) | RECTAL | Status: DC | PRN

## 2021-04-26 MED ORDER — PANTOPRAZOLE 80MG IVPB - SIMPLE MED
80.0000 mg | Freq: Once | INTRAVENOUS | Status: AC
  Administered 2021-04-26: 80 mg via INTRAVENOUS
  Filled 2021-04-26: qty 80

## 2021-04-26 MED ORDER — SODIUM CHLORIDE 0.9% IV SOLUTION: Freq: Once | INTRAVENOUS | Status: DC

## 2021-04-26 MED ORDER — NITROGLYCERIN 0.4 MG SL SUBL: 0.4000 mg | SUBLINGUAL_TABLET | SUBLINGUAL | Status: DC | PRN

## 2021-04-26 MED ORDER — PANTOPRAZOLE SODIUM 40 MG IV SOLR
40.0000 mg | Freq: Once | INTRAVENOUS | Status: AC
  Administered 2021-04-26: 40 mg via INTRAVENOUS
  Filled 2021-04-26: qty 40

## 2021-04-26 MED ORDER — PANTOPRAZOLE SODIUM 40 MG IV SOLR: 40.0000 mg | Freq: Two times a day (BID) | INTRAVENOUS | Status: DC

## 2021-04-26 MED ORDER — SODIUM CHLORIDE 0.9 % IV SOLN: 10.0000 mL/h | Freq: Once | INTRAVENOUS | Status: DC

## 2021-04-26 MED ORDER — INSULIN ASPART 100 UNIT/ML IJ SOLN
0.0000 [IU] | INTRAMUSCULAR | Status: DC
  Administered 2021-04-27 – 2021-04-28 (×4): 1 [IU] via SUBCUTANEOUS
  Filled 2021-04-26: qty 0.06

## 2021-04-26 MED ORDER — ACETAMINOPHEN 325 MG PO TABS
650.0000 mg | ORAL_TABLET | Freq: Four times a day (QID) | ORAL | Status: DC | PRN
  Administered 2021-05-02: 650 mg via ORAL
  Filled 2021-04-26 (×2): qty 2

## 2021-04-26 NOTE — ED Notes (Signed)
Blood bank states 2 units will be ready in ~45 minutes.

## 2021-04-26 NOTE — ED Notes (Signed)
Admission transported here via [place]. Weakness and bloody stools x 2 weeks. Hgb 5.9. Hx of pancreatic cancer.   BP 170/82  HR78  RR18  98% on 2LNC.

## 2021-04-26 NOTE — ED Provider Notes (Signed)
**Manuel [name]** Manuel Manuel Olson   CSN: [id number] Arrival date & time: 04/26/21  1501     History Chief Complaint  Patient presents with   Weakness    From cancer center with low HGB level    Manuel Manuel Olson is a 75 y.o. male.  The history is provided by the patient, medical records and a caregiver. No language interpreter was used.  Weakness Severity:  Moderate Onset quality:  Gradual Duration:  5 days Timing:  Constant Progression:  Worsening Chronicity:  Recurrent Relieved by:  Nothing Worsened by:  Standing Ineffective treatments:  None tried Associated symptoms: abdominal pain (chronic reported), melena, near-syncope and shortness of breath   Associated symptoms: no chest pain, no cough, no diarrhea, no numbness in extremities, no falls, no fever, no foul-smelling urine, no frequency, no headaches, no loss of consciousness, no nausea, no seizures and no vomiting       Past Medical History:  Diagnosis Date   Arthritis    back   Blood transfusion without reported diagnosis    CAD (coronary artery disease)    a. 11/2016 in [place]: STEMI s/p DES to mid LAD, DES to diagonal, DES x 2 to proximal and mid RCA b. 08/2019: NSTEMI with DES to distal RCA   Essential hypertension    Full dentures    GERD (gastroesophageal reflux disease)    History of stroke    Hyperlipidemia    Myocardial infarction [facility])    Pancreatic cancer ([place])    Pneumonia    Renal cell carcinoma ([place])    S/P TAVR (transcatheter aortic valve replacement)    a. 08/2017:  Edwards Sapien 3 THV (size 26 mm, model #9600CM26A , serial # [id number])   Severe aortic stenosis    a. 08/2017: s/p TAVR by Dr. Angelena Form and Dr. Cyndia Bent. 2021 NO AVS on echo   Type 2 diabetes mellitus ([place])    Wears glasses    Wears hearing aid     Patient Active Problem List   Diagnosis Date Noted   Pressure injury of skin 03/07/2021   COVID-19 virus infection 03/05/2021   COVID-19 03/04/2021    Generalized weakness 03/04/2021   Confusion 03/04/2021   Renal cell carcinoma ([place]) 08/13/2020   Anastomotic ulcer 01/21/2020   Long term (current) use of antithrombotics/antiplatelets 01/21/2020   Upper gastrointestinal bleed 12/31/2019   Acute GI bleeding 12/30/2019   Angina pectoris ([facility]) 08/22/2019   Protein-calorie malnutrition, severe 04/05/2018   Debility 02/27/2018   Port-A-Cath in place 10/10/2017   Renal cell carcinoma of right kidney [facility]) s/p nephrectomy 02/2018 10/02/2017   Ileitis, terminal ([place]) 09/28/2017   Hyponatremia 09/27/2017   Insulin dependent type 2 diabetes mellitus ([place]) 08/25/2017   Mixed hyperlipidemia 08/25/2017   Essential hypertension, benign    CAD (coronary artery disease)    Former smoker    Severe AS S/P TAVR  08/22/2017   Cancer of head of pancreas [facility]) s/p [place] 02/2018 08/10/2017    Past Surgical History:  Procedure Laterality Date   APPENDECTOMY     CARDIAC CATHETERIZATION     CORONARY ANGIOGRAPHY N/A 08/23/2019   Procedure: CORONARY ANGIOGRAPHY;  Surgeon: Belva Crome, MD;  Location: [place] [facility];  Service: Cardiovascular;  Laterality: N/A;   CORONARY STENT INTERVENTION N/A 08/23/2019   Procedure: CORONARY STENT INTERVENTION;  Surgeon: Belva Crome, MD;  Location: [place] [facility];  Service: Cardiovascular;  Laterality: N/A;   CORONARY STENT PLACEMENT     x 4  in March 2018   ESOPHAGOGASTRODUODENOSCOPY (EGD) WITH PROPOFOL N/A 01/02/2020   Procedure: ESOPHAGOGASTRODUODENOSCOPY (EGD) WITH PROPOFOL;  Surgeon: Jackquline Denmark, MD;  [facility];  Service: Endoscopy;  Laterality: N/A;   EUS N/A 08/02/2017   Procedure: UPPER ENDOSCOPIC ULTRASOUND (EUS) RADIAL;  Surgeon: [name], MD;  Location: [facility];  Service: Endoscopy;  Laterality: N/A;   EYE SURGERY Left    HAND SURGERY Bilateral    HEMOSTASIS CLIP PLACEMENT  01/02/2020   Procedure: HEMOSTASIS CLIP PLACEMENT;  Surgeon: Jackquline Denmark, MD;  [facility];  Service: Endoscopy;;   HERNIA REPAIR Right    HOT HEMOSTASIS N/A 01/02/2020   Procedure: HOT HEMOSTASIS (ARGON PLASMA COAGULATION/BICAP);  Surgeon: Jackquline Denmark, MD;  Location: [facility];  Service: Endoscopy;  Laterality: N/A;   LAPAROSCOPY N/A 02/19/2018   Procedure: DIAGNOSTIC LAPAROSCOPY, ERAS PATHWAY;  Surgeon: [name], MD;  Location: [place];  Service: General;  Laterality: N/A;   MULTIPLE TOOTH EXTRACTIONS     NEPHRECTOMY Left 02/19/2018   Procedure: OPEN LEFT RADICAL NEPHRECTOMY;  Surgeon: Cleon Gustin, MD;  Location: [place];  Service: Urology;  Laterality: Left;   PORT-A-CATH REMOVAL N/A 06/13/2019   Procedure: PORT REMOVAL;  Surgeon: [name], MD;  Location: [place];  Service: General;  Laterality: N/A;   PORTACATH PLACEMENT N/A 09/15/2017   Procedure: INSERTION PORT-A-CATH;  Surgeon: [name], MD;  Location: [place];  Service: General;  Laterality: N/A;   RIGHT/LEFT HEART CATH AND CORONARY ANGIOGRAPHY N/A 07/05/2017   Procedure: RIGHT/LEFT HEART CATH AND CORONARY ANGIOGRAPHY;  Surgeon: [name], MD;  Location: [place] [facility];  Service: Cardiovascular;  Laterality: N/A;   SCLEROTHERAPY  01/02/2020   Procedure: SCLEROTHERAPY;  Surgeon: Jackquline Denmark, MD;  [facility];  Service: Endoscopy;;   SHOULDER ARTHROSCOPY WITH ROTATOR CUFF REPAIR Left    TEE WITHOUT CARDIOVERSION N/A 08/22/2017   Procedure: TRANSESOPHAGEAL ECHOCARDIOGRAM (TEE);  Surgeon: Burnell Blanks, MD;  Location: [place];  Service: Open Heart Surgery;  Laterality: N/A;   TRANSCATHETER AORTIC VALVE REPLACEMENT, TRANSFEMORAL N/A 08/22/2017   Procedure: TRANSCATHETER AORTIC VALVE REPLACEMENT, TRANSFEMORAL;  Surgeon: Burnell Blanks, MD;  Location: [place];  Service: Open Heart Surgery;  Laterality: N/A;   UPPER GASTROINTESTINAL ENDOSCOPY     WHIPPLE PROCEDURE N/A 02/19/2018   Procedure: WHIPPLE PROCEDURE;  Surgeon: [name], MD;  Location: [place];  Service: General;  Laterality: N/A;       Family History  Problem Relation Age of Onset   Lupus Mother    CAD Brother    Hypertension Brother    Colon cancer Neg Hx    Esophageal cancer Neg Hx    Rectal cancer Neg Hx    Stomach cancer Neg Hx     Social History   Tobacco Use   Smoking status: Former    Packs/day: 1.00    Years: 57.00    Pack years: 57.00    Types: Cigarettes    Start date: 09/12/1958    Quit date: 04/12/2016    Years since quitting: 5.0   Smokeless tobacco: Never  Vaping Use   Vaping Use: Never used  Substance Use Topics   Alcohol use: No   Drug use: No    Home Medications Prior to Admission medications   Medication Sig Start Date End Date Taking? Authorizing Provider  acetaminophen (TYLENOL) 325 MG tablet Take 325-650 mg by mouth every 6 (six) hours as needed for mild pain.  [provider]  atorvastatin (LIPITOR) 20 MG tablet TAKE 1 TABLET BY MOUTH ONCE DAILY. 04/17/20   Satira Sark, MD  clopidogrel (PLAVIX) 75 MG tablet TAKE 1 TABLET BY MOUTH ONCE DAILY WITH BREAKFAST. 05/20/20   Satira Sark, MD  Continuous Blood Gluc Sensor (FREESTYLE LIBRE 14 DAY SENSOR) MISC Inject 1 patch into the skin every 14 (fourteen) days.    [provider]  CREON 36000 units CPEP capsule TAKE 1 CAPSULE BY MOUTH BEFORE EACH MEAL AND SNACK. TAKE UP TO 6 CAPSULES PER DAY. Patient taking differently: Pt taking 2 tab before meals. Pt reported 04/20/21 04/09/19   Ladell Pier, MD  diphenoxylate-atropine (LOMOTIL) 2.5-0.025 MG tablet Take 1 tablet by mouth 4 (four) times daily as needed for diarrhea or loose stools. 03/08/21   Ghimire, Henreitta Leber, MD  ferrous sulfate 325 (65 FE) MG tablet Take 650 mg by mouth daily with breakfast.    [provider]  Insulin Glargine (BASAGLAR KWIKPEN) 100 UNIT/ML Inject 8 Units into the skin See admin instructions. Inject 8 units into the skin in the morning, WHEN EATING    [provider]  insulin  lispro (HUMALOG KWIKPEN) 100 UNIT/ML KwikPen Inject 5-8 Units into the skin 3 (three) times daily before meals. Inject 5-8 units 3 times a day with meals. Patient taking differently: Inject 2-3 Units into the skin 3 (three) times daily before meals. Inject 5-8 units 3 times a day with meals. 12/04/20   Philemon Kingdom, MD  Insulin Pen Needle (PEN NEEDLES) 31G X 6 MM MISC Use as directed to check blood sugar 4 times a day. 11/10/20   Philemon Kingdom, MD  losartan (COZAAR) 25 MG tablet Take 0.5 tablets (12.5 mg total) by mouth daily. Patient not taking: No sig reported 02/18/21   Erma Heritage, PA-C  megestrol (MEGACE) 40 MG/ML suspension Take 200 mg by mouth 3 (three) times daily before meals. 02/26/21   [provider]  nitroGLYCERIN (NITROSTAT) 0.4 MG SL tablet Place 1 tablet (0.4 mg total) under the tongue every 5 (five) minutes x 3 doses as needed for chest pain (if no relief after 2nd dose, proceed to the ED for an evaluation or call 911). 01/15/21   Satira Sark, MD  pantoprazole (PROTONIX) 40 MG tablet Take 1 tablet (40 mg total) by mouth 2 (two) times daily. 11/05/20   [name], MD    Allergies    Carvedilol  Review of Systems   Review of Systems  Constitutional:  Positive for fatigue. Negative for chills and fever.  Eyes:  Negative for visual disturbance.  Respiratory:  Positive for shortness of breath. Negative for cough, chest tightness and wheezing.   Cardiovascular:  Positive for near-syncope. Negative for chest pain and leg swelling.  Gastrointestinal:  Positive for abdominal pain (chronic reported) and melena. Negative for constipation, diarrhea, nausea and vomiting.  Genitourinary:  Negative for flank pain and frequency.  Musculoskeletal:  Negative for back pain and falls.  Neurological:  Positive for weakness and light-headedness. Negative for seizures, loss of consciousness, syncope and headaches.   Physical Exam Updated Vital Signs Ht 5' 8"  (1.727  m)   Wt 52.6 kg   BMI 17.64 kg/m   Physical Exam Vitals and nursing Manuel Olson reviewed.  Constitutional:      General: He is not in acute distress.    Appearance: He is well-developed. He is ill-appearing. He is not toxic-appearing or diaphoretic.  HENT:     Head: Normocephalic  and atraumatic.     Mouth/Throat:     Mouth: Mucous membranes are moist.  Eyes:     Conjunctiva/sclera: Conjunctivae normal.     Pupils: Pupils are equal, round, and reactive to light.  Cardiovascular:     Rate and Rhythm: Normal rate and regular rhythm.     Heart sounds: No murmur heard. Pulmonary:     Effort: Pulmonary effort is normal. No respiratory distress.     Breath sounds: Rales present. No wheezing or rhonchi.  Chest:     Chest wall: No tenderness.  Abdominal:     Palpations: Abdomen is soft.     Tenderness: There is no abdominal tenderness. There is no guarding or rebound.  Musculoskeletal:        General: No tenderness.     Cervical back: Neck supple. No tenderness.  Skin:    General: Skin is warm and dry.     Coloration: Skin is pale.     Findings: No erythema.  Neurological:     General: No focal deficit present.     Mental Status: He is alert. Mental status is at baseline.  Psychiatric:        Mood and Affect: Mood normal.    ED Results / Procedures / Treatments   Labs (all labs ordered are listed, but only abnormal results are displayed) Labs Reviewed  RESP PANEL BY RT-PCR (FLU A&B, COVID) ARPGX2 - Abnormal; Notable for the following components:      Result Value   SARS Coronavirus 2 by RT PCR POSITIVE (*)    All other components within normal limits  COMPREHENSIVE METABOLIC PANEL - Abnormal; Notable for the following components:   Sodium 130 (*)    CO2 20 (*)    Glucose, Bld 193 (*)    BUN 30 (*)    Creatinine, Ser 1.27 (*)    Calcium 8.8 (*)    Total Protein 6.1 (*)    Albumin 3.1 (*)    Alkaline Phosphatase 194 (*)    GFR, Estimated 58 (*)    All other components  within normal limits  LIPASE, BLOOD - Abnormal; Notable for the following components:   Lipase <10 (*)    All other components within normal limits  BRAIN NATRIURETIC PEPTIDE - Abnormal; Notable for the following components:   B Natriuretic Peptide 125.0 (*)    All other components within normal limits  CBC WITH DIFFERENTIAL/PLATELET - Abnormal; Notable for the following components:   WBC 12.3 (*)    RBC 1.99 (*)    Hemoglobin 5.9 (*)    HCT 18.2 (*)    RDW 15.9 (*)    Neutro Abs 10.1 (*)    Monocytes Absolute 1.1 (*)    All other components within normal limits  TROPONIN I (HIGH SENSITIVITY) - Abnormal; Notable for the following components:   Troponin I (High Sensitivity) 31 (*)    All other components within normal limits  TROPONIN I (HIGH SENSITIVITY) - Abnormal; Notable for the following components:   Troponin I (High Sensitivity) 29 (*)    All other components within normal limits  PROTIME-INR  BASIC METABOLIC PANEL  CBC  TYPE AND SCREEN  PREPARE RBC (CROSSMATCH)  PREPARE RBC (CROSSMATCH)    EKG EKG Interpretation  Date/Time:  Monday April 26 2021 15:11:14 EDT Ventricular Rate:  85 PR Interval:  151 QRS Duration: 90 QT Interval:  404 QTC Calculation: 481 R Axis:   73 Text Interpretation: Sinus rhythm Probable left ventricular hypertrophy Borderline  prolonged QT interval When compared to prior, similar appearance with more wandering baseline and artifact. No STEMI Confirmed by Antony Blackbird [phone]) on 04/26/2021 4:17:42 PM  Radiology DG Chest Portable 1 View  Result Date: 04/26/2021 CLINICAL DATA:  Shortness of breath. Pt is here for weakness and dizziness from cancer center EXAM: PORTABLE CHEST 1 VIEW COMPARISON:  Chest x-ray 03/04/2021. FINDINGS: The heart size and mediastinal contours are unchanged. Status post aortic valve replacement. Aortic calcification. Hyperinflation. No focal consolidation. Coarsened interstitial markings with no overt pulmonary edema. No  pleural effusion. No pneumothorax. No acute osseous abnormality. IMPRESSION: 1. No active disease. 2. Aortic Atherosclerosis (ICD10-I70.0) and Emphysema (ICD10-J43.9). Electronically Signed   By: Iven Finn M.D.   On: 04/26/2021 16:37    Procedures Procedures   CRITICAL CARE Performed by: Gwenyth Allegra Lashandra Arauz Total critical care time: 35 minutes Critical care time was exclusive of separately billable procedures and treating other patients. Critical care was necessary to treat or prevent imminent or life-threatening deterioration. Critical care was time spent personally by me on the following activities: development of treatment plan with patient and/or surrogate as well as nursing, discussions with consultants, evaluation of patient's response to treatment, examination of patient, obtaining history from patient or surrogate, ordering and performing treatments and interventions, ordering and review of laboratory studies, ordering and review of radiographic studies, pulse oximetry and re-evaluation of patient's condition.   Medications Ordered in ED Medications  0.9 %  sodium chloride infusion (Manually program via Guardrails IV Fluids) (0 mLs Intravenous Hold 04/26/21 1951)  pantoprozole (PROTONIX) 80 mg /NS 100 mL infusion (8 mg/hr Intravenous New Bag/Given 04/26/21 2057)  pantoprazole (PROTONIX) injection 40 mg (has no administration in time range)  nitroGLYCERIN (NITROSTAT) SL tablet 0.4 mg (has no administration in time range)  insulin aspart (novoLOG) injection 0-6 Units (has no administration in time range)  acetaminophen (TYLENOL) tablet 650 mg (has no administration in time range)    Or  acetaminophen (TYLENOL) suppository 650 mg (has no administration in time range)  pantoprazole (PROTONIX) injection 40 mg (40 mg Intravenous Given 04/26/21 1759)  pantoprazole (PROTONIX) 80 mg /NS 100 mL IVPB (0 mg Intravenous Stopped 04/26/21 2056)    ED Course  I have reviewed the triage vital  signs and the nursing notes.  Pertinent labs & imaging results that were available during my care of the patient were reviewed by me and considered in my medical decision making (see chart for details).    MDM Rules/Calculators/A&P                           EVREN SHANKLAND is a 75 y.o. male with a past medical history significant for previous pancreatic cancer, aortic stenosis status post TAVR, CAD, hypertension, hyperlipidemia, diabetes, and prior acute GI bleed last year who presents with symptomatic anemia and GI bleeding.  According to the oncology provider upstairs to call down, patient has been having dark and bloody stools for the last several days and has been having near syncope and lightheadedness.  They have been having blood pressures in the 60A and 54U systolic at home requiring him to lay down and raise his legs to maintain mental status.  He has been having more exertional shortness of breath but denies any chest pain.  He reports chronic abdominal pain does not seem to be any different.  Denies any nausea or vomiting.  Denies any syncopal episodes.  He had blood work done approximately  6 days ago and hemoglobin was 9.5 however today while being seen by the oncology team, he will return to 5.9.  They sent the patient downstairs to expedite admission and blood transfusion.  On exam, patient does appear pale.  Lungs have some faint rales but otherwise chest is nontender.  Abdomen is nontender.  Systolic murmur appreciated.  Patient moving all extremities.  Patient blood pressure is around 166 systolic and he is not tachycardic at this time.  Oxygen saturations were dipping into the upper 80s while speaking which is consistent with the concern for symptomatic anemia.  As we are at the stand-alone emergency department, we only have blood to transfuse for patient is an extremis.  As his blood pressure is above 100 right now he is mentating well, we will expedite blood work and to get him  admitted so that he can be transfused with not emergency release blood.  If patient is to decompensate, will get emergency release blood.  Anticipate admission as quickly as possible.  5:11 PM Work-up has returned confirming hemoglobin of 5.9.  Creatinine is similar at 1.27.  Sodium similar at 130.  Lipase is nonelevated.  Troponin initially was slightly more elevated at 31, will trend.  COVID test is positive however patient was diagnosed positive for COVID on 02/22/2021 (around 2 months ago).  Unclear if this is a new COVID infection versus residual positive test from prior.  Will call for admission for symptomatic anemia.   Final Clinical Impression(s) / ED Diagnoses Final diagnoses:  Generalized weakness  Symptomatic anemia  Gastrointestinal hemorrhage, unspecified gastrointestinal hemorrhage type     Clinical Impression: 1. Generalized weakness   2. Symptomatic anemia   3. Gastrointestinal hemorrhage, unspecified gastrointestinal hemorrhage type     Disposition: Admit  This Manuel Olson was prepared with assistance of Dragon voice recognition software. Occasional wrong-word or sound-a-like substitutions may have occurred due to the inherent limitations of voice recognition software.     Azaria Stegman, Gwenyth Allegra, MD 04/27/21 [phone]

## 2021-04-26 NOTE — ED Notes (Signed)
Handoff report given to carelink and charge nurse at [facility] ED.  Care link present to transpaort patient.

## 2021-04-26 NOTE — H&P (Signed)
History and Physical    Manuel Olson EVO:[id number] DOB: 1945/12/09 DOA: 04/26/2021  PCP: [name], MD  Patient coming from: Home.  Chief Complaint: Weakness.  HPI: Manuel Olson is a 75 y.o. male with history of pancreatic cancer status post Whipple's procedure, renal carcinoma status post nephrectomy, diabetes mellitus, CAD status post tenting, status post TAVR had followed up with patient's oncologist Dr. [name] last week and over the had routine blood work which was found to be low hemoglobin.  Patient was advised to come back in a week's time to recheck his blood work and was instructed to check his stools.  Patient has been having black stools for last few days.  In the meantime patient also got weak and dizzy and short of breath.  Patient came to the ER.  Patient had an unremarkable EGD in April of this year by Dr. [name] gastroenterologist.  Patient was admitted in April 2021 for GI bleed at that time patient had bleeding ulcers at the anastomotic site which had to be Endo Clip.  ED Course: In the ER patient's labs show hemoglobin of 5.9 with high sensitive troponin of 31 and 29.  Creatinine 1.2.  Sodium 130.  Stool for occult blood was positive.  Patient admitted for acute GI bleed.  Patient is incidentally COVID-positive.  Chest x-ray is unremarkable and patient is not hypoxic.  EKG shows normal sinus rhythm.  Review of Systems: As per HPI, rest all negative.   Past Medical History:  Diagnosis Date   Arthritis    back   Blood transfusion without reported diagnosis    CAD (coronary artery disease)    a. 77/2018 in [place]: STEMI s/p DES to mid LAD, DES to diagonal, DES x 2 to proximal and mid RCA b. 08/2019: NSTEMI with DES to distal RCA   Essential hypertension    Full dentures    GERD (gastroesophageal reflux disease)    History of stroke    Hyperlipidemia    Myocardial infarction [facility])    Pancreatic cancer ([place])    Pneumonia    Renal cell carcinoma  ([place])    S/P TAVR (transcatheter aortic valve replacement)    a. 08/2017:  Edwards Sapien 3 THV (size 26 mm, model #9600CM26A , serial # [id number])   Severe aortic stenosis    a. 08/2017: s/p TAVR by Dr. Angelena Form and Dr. Cyndia Bent. 2021 NO AVS on echo   Type 2 diabetes mellitus ([place])    Wears glasses    Wears hearing aid     Past Surgical History:  Procedure Laterality Date   APPENDECTOMY     CARDIAC CATHETERIZATION     CORONARY ANGIOGRAPHY N/A 08/23/2019   Procedure: CORONARY ANGIOGRAPHY;  Surgeon: Belva Crome, MD;  Location: [place] [facility];  Service: Cardiovascular;  Laterality: N/A;   CORONARY STENT INTERVENTION N/A 08/23/2019   Procedure: CORONARY STENT INTERVENTION;  Surgeon: Belva Crome, MD;  Location: [place] [facility];  Service: Cardiovascular;  Laterality: N/A;   CORONARY STENT PLACEMENT     x 4 in March 2018   ESOPHAGOGASTRODUODENOSCOPY (EGD) WITH PROPOFOL N/A 01/02/2020   Procedure: ESOPHAGOGASTRODUODENOSCOPY (EGD) WITH PROPOFOL;  Surgeon: Jackquline Denmark, MD;  [facility];  Service: Endoscopy;  Laterality: N/A;   EUS N/A 08/02/2017   Procedure: UPPER ENDOSCOPIC ULTRASOUND (EUS) RADIAL;  Surgeon: [name], MD;  [facility]: [facility];  Service: Endoscopy;  Laterality: N/A;   EYE SURGERY Left    HAND SURGERY  Bilateral    HEMOSTASIS CLIP PLACEMENT  01/02/2020   Procedure: HEMOSTASIS CLIP PLACEMENT;  Surgeon: Jackquline Denmark, MD;  [facility];  Service: Endoscopy;;   HERNIA REPAIR Right    HOT HEMOSTASIS N/A 01/02/2020   Procedure: HOT HEMOSTASIS (ARGON PLASMA COAGULATION/BICAP);  Surgeon: Jackquline Denmark, MD;  Location: [facility];  Service: Endoscopy;  Laterality: N/A;   LAPAROSCOPY N/A 02/19/2018   Procedure: DIAGNOSTIC LAPAROSCOPY, ERAS PATHWAY;  Surgeon: [name], MD;  Location: [place];  Service: General;  Laterality: N/A;   MULTIPLE TOOTH EXTRACTIONS     NEPHRECTOMY Left 02/19/2018   Procedure: OPEN LEFT RADICAL NEPHRECTOMY;  Surgeon:  Cleon Gustin, MD;  Location: [place];  Service: Urology;  Laterality: Left;   PORT-A-CATH REMOVAL N/A 06/13/2019   Procedure: PORT REMOVAL;  Surgeon: [name], MD;  Location: [place];  Service: General;  Laterality: N/A;   PORTACATH PLACEMENT N/A 09/15/2017   Procedure: INSERTION PORT-A-CATH;  Surgeon: [name], MD;  Location: [place];  Service: General;  Laterality: N/A;   RIGHT/LEFT HEART CATH AND CORONARY ANGIOGRAPHY N/A 07/05/2017   Procedure: RIGHT/LEFT HEART CATH AND CORONARY ANGIOGRAPHY;  Surgeon: [name], MD;  Location: [place] [facility];  Service: Cardiovascular;  Laterality: N/A;   SCLEROTHERAPY  01/02/2020   Procedure: SCLEROTHERAPY;  Surgeon: Jackquline Denmark, MD;  [facility];  Service: Endoscopy;;   SHOULDER ARTHROSCOPY WITH ROTATOR CUFF REPAIR Left    TEE WITHOUT CARDIOVERSION N/A 08/22/2017   Procedure: TRANSESOPHAGEAL ECHOCARDIOGRAM (TEE);  Surgeon: Burnell Blanks, MD;  Location: [place];  Service: Open Heart Surgery;  Laterality: N/A;   TRANSCATHETER AORTIC VALVE REPLACEMENT, TRANSFEMORAL N/A 08/22/2017   Procedure: TRANSCATHETER AORTIC VALVE REPLACEMENT, TRANSFEMORAL;  Surgeon: Burnell Blanks, MD;  Location: [place];  Service: Open Heart Surgery;  Laterality: N/A;   UPPER GASTROINTESTINAL ENDOSCOPY     WHIPPLE PROCEDURE N/A 02/19/2018   Procedure: WHIPPLE PROCEDURE;  Surgeon: [name], MD;  Location: [place];  Service: General;  Laterality: N/A;     reports that he quit smoking about 5 years ago. His smoking use included cigarettes. He started smoking about 62 years ago. He has a 57.00 pack-year smoking history. He has never used smokeless tobacco. He reports that he does not drink alcohol and does not use drugs.  Allergies  Allergen Reactions   Carvedilol Other (See Comments)    "Could not function when taking this"    Family History  Problem Relation Age of Onset   Lupus Mother    CAD Brother     Hypertension Brother    Colon cancer Neg Hx    Esophageal cancer Neg Hx    Rectal cancer Neg Hx    Stomach cancer Neg Hx     Prior to Admission medications   Medication Sig Start Date End Date Taking? Authorizing Provider  acetaminophen (TYLENOL) 325 MG tablet Take 325-650 mg by mouth every 6 (six) hours as needed for mild pain.    [provider]  atorvastatin (LIPITOR) 20 MG tablet TAKE 1 TABLET BY MOUTH ONCE DAILY. 04/17/20   Satira Sark, MD  clopidogrel (PLAVIX) 75 MG tablet TAKE 1 TABLET BY MOUTH ONCE DAILY WITH BREAKFAST. 05/20/20   Satira Sark, MD  Continuous Blood Gluc Sensor (FREESTYLE LIBRE 14 DAY SENSOR) MISC Inject 1 patch into the skin every 14 (fourteen) days.    [provider]  CREON 36000 units CPEP capsule TAKE 1 CAPSULE BY MOUTH BEFORE EACH MEAL AND SNACK.  TAKE UP TO 6 CAPSULES PER DAY. Patient taking differently: Pt taking 2 tab before meals. Pt reported 04/20/21 04/09/19   Ladell Pier, MD  diphenoxylate-atropine (LOMOTIL) 2.5-0.025 MG tablet Take 1 tablet by mouth 4 (four) times daily as needed for diarrhea or loose stools. 03/08/21   Ghimire, Henreitta Leber, MD  ferrous sulfate 325 (65 FE) MG tablet Take 650 mg by mouth daily with breakfast.    [provider]  Insulin Glargine (BASAGLAR KWIKPEN) 100 UNIT/ML Inject 8 Units into the skin See admin instructions. Inject 8 units into the skin in the morning, WHEN EATING    [provider]  insulin lispro (HUMALOG KWIKPEN) 100 UNIT/ML KwikPen Inject 5-8 Units into the skin 3 (three) times daily before meals. Inject 5-8 units 3 times a day with meals. Patient taking differently: Inject 2-3 Units into the skin 3 (three) times daily before meals. Inject 5-8 units 3 times a day with meals. 12/04/20   Philemon Kingdom, MD  Insulin Pen Needle (PEN NEEDLES) 31G X 6 MM MISC Use as directed to check blood sugar 4 times a day. 11/10/20   Philemon Kingdom, MD  losartan (COZAAR) 25 MG tablet Take 0.5  tablets (12.5 mg total) by mouth daily. Patient not taking: No sig reported 02/18/21   Erma Heritage, PA-C  megestrol (MEGACE) 40 MG/ML suspension Take 200 mg by mouth 3 (three) times daily before meals. 02/26/21   [provider]  nitroGLYCERIN (NITROSTAT) 0.4 MG SL tablet Place 1 tablet (0.4 mg total) under the tongue every 5 (five) minutes x 3 doses as needed for chest pain (if no relief after 2nd dose, proceed to the ED for an evaluation or call 911). 01/15/21   Satira Sark, MD  pantoprazole (PROTONIX) 40 MG tablet Take 1 tablet (40 mg total) by mouth 2 (two) times daily. 11/05/20   [name], MD    Physical Exam: Constitutional: Moderately built and nourished. Vitals:   04/26/21 1508 04/26/21 1512 04/26/21 1628  BP:  (!) 145/68 (!) 159/83  Pulse:  88 78  Resp:  20 11  Temp:  97.8 F (36.6 C)   TempSrc:  Oral   SpO2:  100% 100%  Weight: 52.6 kg    Height: 5' 8"  (1.727 m)     Eyes: Anicteric no pallor. ENMT: No discharge from the ears eyes nose and mouth. Neck: No mass felt.  No neck rigidity. Respiratory: No rhonchi or crepitations. Cardiovascular: S1-S2 heard. Abdomen: Soft nontender bowel sound present. Musculoskeletal: No edema. Skin: No rash. Neurologic: Alert awake oriented to time place and person.  Moves all extremities. Psychiatric: Appears normal.  Normal affect.   Labs on Admission: I have personally reviewed following labs and imaging studies  CBC: Recent Labs  Lab 04/20/21 1129 04/26/21 1331 04/26/21 1552  WBC 10.4 13.6* 12.3*  NEUTROABS 8.4* 11.2* 10.1*  HGB 9.2* 5.9* 5.9*  HCT 28.3* 17.8* 18.2*  MCV 91.9 90.4 91.5  PLT 185 358 951   Basic Metabolic Panel: Recent Labs  Lab 04/26/21 1552  NA 130*  K 4.9  CL 100  CO2 20*  GLUCOSE 193*  BUN 30*  CREATININE 1.27*  CALCIUM 8.8*   GFR: Estimated Creatinine Clearance: 36.2 mL/min (A) (by C-G formula based on SCr of 1.27 mg/dL (H)). Liver Function Tests: Recent Labs  Lab  04/26/21 1552  AST 17  ALT 26  ALKPHOS 194*  BILITOT 0.5  PROT 6.1*  ALBUMIN 3.1*   Recent Labs  Lab  04/26/21 1552  LIPASE <10*   No results for input(s): AMMONIA in the last 168 hours. Coagulation Profile: Recent Labs  Lab 04/26/21 1726  INR 1.1   Cardiac Enzymes: No results for input(s): CKTOTAL, CKMB, CKMBINDEX, TROPONINI in the last 168 hours. BNP (last 3 results) No results for input(s): PROBNP in the last 8760 hours. HbA1C: No results for input(s): HGBA1C in the last 72 hours. CBG: No results for input(s): GLUCAP in the last 168 hours. Lipid Profile: No results for input(s): CHOL, HDL, LDLCALC, TRIG, CHOLHDL, LDLDIRECT in the last 72 hours. Thyroid Function Tests: No results for input(s): TSH, T4TOTAL, FREET4, T3FREE, THYROIDAB in the last 72 hours. Anemia Panel: No results for input(s): VITAMINB12, FOLATE, FERRITIN, TIBC, IRON, RETICCTPCT in the last 72 hours. Urine analysis:    Component Value Date/Time   COLORURINE YELLOW 03/04/2021 2034   APPEARANCEUR HAZY (A) 03/04/2021 2034   APPEARANCEUR Clear 08/13/2020 0952   LABSPEC 1.021 03/04/2021 2034   PHURINE 5.0 03/04/2021 2034   GLUCOSEU NEGATIVE 03/04/2021 2034   HGBUR NEGATIVE 03/04/2021 2034   BILIRUBINUR NEGATIVE 03/04/2021 2034   BILIRUBINUR Negative 08/13/2020 0952   KETONESUR 5 (A) 03/04/2021 2034   PROTEINUR 30 (A) 03/04/2021 2034   NITRITE NEGATIVE 03/04/2021 2034   LEUKOCYTESUR NEGATIVE 03/04/2021 2034   Sepsis Labs: @LABRCNTIP (procalcitonin:4,lacticidven:4) ) Recent Results (from the past 240 hour(s))  Resp Panel by RT-PCR (Flu A&B, Covid) Nasopharyngeal Swab     Status: Abnormal   Collection Time: 04/26/21  3:57 PM   Specimen: Nasopharyngeal Swab; Nasopharyngeal(NP) swabs in vial transport medium  Result Value Ref Range Status   SARS Coronavirus 2 by RT PCR POSITIVE (A) NEGATIVE Final    Comment: RESULT CALLED TO, READ BACK BY AND VERIFIED WITH: PATTY DOWD RN [id number] [facility] 04/26/21   (NOTE) SARS-CoV-2 target nucleic acids are DETECTED.  The SARS-CoV-2 RNA is generally detectable in upper respiratory specimens during the acute phase of infection. Positive results are indicative of the presence of the identified virus, but do not rule out bacterial infection or co-infection with other pathogens not detected by the test. Clinical correlation with patient history and other diagnostic information is necessary to determine patient infection status. The expected result is Negative.  Fact Sheet for Patients: EntrepreneurPulse.com.au  Fact Sheet for Healthcare Providers: IncredibleEmployment.be  This test is not yet approved or cleared by the [place] FDA and  has been authorized for detection and/or diagnosis of SARS-CoV-2 by FDA under an Emergency Use Authorization (EUA).  This EUA will remain in effect (meaning this test can be u sed) for the duration of  the COVID-19 declaration under Section 564(b)(1) of the Act, 21 U.S.C. section 360bbb-3(b)(1), unless the authorization is terminated or revoked sooner.     Influenza A by PCR NEGATIVE NEGATIVE Final   Influenza B by PCR NEGATIVE NEGATIVE Final    Comment: (NOTE) The Xpert Xpress SARS-CoV-2/FLU/RSV plus assay is intended as an aid in the diagnosis of influenza from Nasopharyngeal swab specimens and should not be used as a sole basis for treatment. Nasal washings and aspirates are unacceptable for Xpert Xpress SARS-CoV-2/FLU/RSV testing.  Fact Sheet for Patients: EntrepreneurPulse.com.au  Fact Sheet for Healthcare Providers: IncredibleEmployment.be  This test is not yet approved or cleared by the [place] FDA and has been authorized for detection and/or diagnosis of SARS-CoV-2 by FDA under an Emergency Use Authorization (EUA). This EUA will remain in effect (meaning this test can be used) for the duration of the COVID-19  declaration under  Section 564(b)(1) of the Act, 21 U.S.C. section 360bbb-3(b)(1), unless the authorization is terminated or revoked.  Performed at [facility], [address], [place], [place] [address]      Radiological Exams on Admission: DG Chest Portable 1 View  Result Date: 04/26/2021 CLINICAL DATA:  Shortness of breath. Pt is here for weakness and dizziness from cancer center EXAM: PORTABLE CHEST 1 VIEW COMPARISON:  Chest x-ray 03/04/2021. FINDINGS: The heart size and mediastinal contours are unchanged. Status post aortic valve replacement. Aortic calcification. Hyperinflation. No focal consolidation. Coarsened interstitial markings with no overt pulmonary edema. No pleural effusion. No pneumothorax. No acute osseous abnormality. IMPRESSION: 1. No active disease. 2. Aortic Atherosclerosis (ICD10-I70.0) and Emphysema (ICD10-J43.9). Electronically Signed   By: Iven Finn M.D.   On: 04/26/2021 16:37    EKG: Independently reviewed.  Normal sinus rhythm.  Assessment/Plan Principal Problem:   GI bleed Active Problems:   Cancer of head of pancreas (HCC) s/p Whipple 02/2018   Severe AS S/P TAVR    Essential hypertension, benign   CAD (coronary artery disease)   Insulin dependent type 2 diabetes mellitus (HCC)   Acute GI bleeding   COVID-19    Acute GI bleeding -patient has had previous history of bleeding from the anastomotic site.  Has had previous Whipple's procedure.  We will keep patient n.p.o. and on Protonix and transfused 2 units of PRBC and have requested  GI consult. Acute blood loss anemia follow CBC after transfusion.  2 units of PRBC transfusion has been ordered. Incidentally COVID 19 positive.  Patient also was COVID-positive 2 months ago when patient was admitted for hyponatremia and dehydration.  At this time patient is asymptomatic.  Patient and wife is requesting no treatment at this time. History of pancreatic cancer status post  Whipple's procedure being followed by Dr. [name]. History of renal cancer status post nephrectomy. Chronic kidney disease stage III with history of single kidney closely monitor metabolic panel. Diabetes mellitus type 2 we will keep patient on sliding scale coverage for now.  Usually takes Lantus in the morning. History of CAD status post stenting has taken his Plavix today.  Holding Plavix due to acute GI bleed.  Patient agreeable. Status post TAVR.  Since patient has acute GI bleed with significant drop in hemoglobin over 4 g but with initial presentation was hypotension with patient being COVID-positive we will keep patient inpatient.   DVT prophylaxis: SCDs.  Avoiding anticoagulation in the setting of GI bleed. Code Status: Full code. Family Communication: Patient's wife. Disposition Plan: Home. Consults called:  GI. Admission status: Inpatient.   Rise Patience MD [facility] Pager [phone].  If 7PM-7AM, please contact night-coverage [email or web address] Password [email or web address]  04/26/2021, 8:38 PM

## 2021-04-26 NOTE — Progress Notes (Signed)
CRITICAL VALUE STICKER  CRITICAL VALUE: Hgb 5.9    RECEIVER (on-site recipient of call):Carry Ortez,RN  DATE & TIME NOTIFIED: 04/26/21 @ 1405  MESSENGER (representative from lab):Simona Huh  MD NOTIFIED: [name], NP  TIME OF NOTIFICATION: 1410  RESPONSE:

## 2021-04-26 NOTE — ED Notes (Signed)
RN entered room to find pt getting out of the bed. Pt states he wants to leave because we are not allowing his wife to come into his room. Pt tested COVID+ today and is not allowed visitors per policy. Pt was informed of the hospital policy. Pt was provided with his belongings that the wife had. Staff spoke with wife and wife understood and was ok with the policy and stated she would go home. Pt requesting staff to remove his covid results from his chart to he can have a visitor. RN told pt this is not possible. Pt continued to talk about how covid is not real and that we are holding him against his will. RN explained to pt that his covid test came back positive, and that we are not treating him for covid, we are treating him for his anemia. Also explained we are not holding him against his will, he is free to leave AMA, but encourage pt to remain in hospital for further treatment. Pt was not acceptive to information provided by RN. Admitting MD at bedside to speak with patient.

## 2021-04-26 NOTE — Progress Notes (Signed)
[name] OFFICE PROGRESS NOTE   Diagnosis: Pancreas cancer  INTERVAL HISTORY:   Manuel Olson returns prior to scheduled follow-up due to progressive weakness.  He reports increased diarrhea and bloody/black stools over the past several days.  He is weaker.  He is dizzy.  He feels how he has in the past when he has had an acute GI bleed.  His wife reports his blood pressure was very low over the weekend.  To improve his blood pressure they placed support stockings on his legs and raised his legs above the level of his heart.  Objective:  Vital signs in last 24 hours:  Blood pressure 94/63, pulse (!) 55, temperature 97.8 F (36.6 C), temperature source Oral, resp. rate 20, height 5' 9"  (1.753 m), weight 112 lb (50.8 kg), SpO2 100 %.    HEENT: Conjunctival pallor.  No thrush or ulcers. Resp: Distant breath sounds. Cardio: Regular rate and rhythm. GI: Abdomen tender mid lateral right abdomen.  No mass.  No hepatosplenomegaly.  No apparent ascites. Vascular: No leg edema. Skin: Pale appearing.   Lab Results:  Lab Results  Component Value Date   WBC 13.6 (H) 04/26/2021   HGB 5.9 (LL) 04/26/2021   HCT 17.8 (L) 04/26/2021   MCV 90.4 04/26/2021   PLT 358 04/26/2021   NEUTROABS 11.2 (H) 04/26/2021    Imaging:  No results found.  Medications: I have reviewed the patient's current medications.  Assessment/Plan: Adenocarcinoma of the pancreas head/neck, status post an EUS biopsy 08/02/2017 confirming adenocarcinoma EUS 08/02/2017-pancreas head/neck mass, abutment of the portal vein, no peripancreatic adenopathy MRI abdomen 07/27/2017- head/neck pancreas mass with mass-effect on the portal splenic confluence, no involvement of the celiac axis or superior mesenteric artery, no evidence of metastatic disease PET scan 08/31/2017-very hypermetabolism at the pancreatic head mass, no evidence of metastatic disease.  The isolated right lower lobe nodule below PET  resolution, left sided renal mass with decreased activity relative to the normal adjacent kidney Cycle 1 FOLFIRINOX 09/16/2017 Cycle 2 FOLFOX 10/16/2017 Cycle 3 FOLFOX 10/30/2017 Cycle 4 FOLFIRINOX 11/14/2017 (irinotecan resumed with a dose reduction) Cycle 5 FOLFIRINOX 11/29/2017 (Oxaliplatin dose reduced due to thrombocytopenia) Cycle 6 FOLFIRINOX 12/13/2017 CTs 12/25/2017-stable right lower lobe nodule, decrease in size of the pancreas head mass, stable left kidney mass Pancreaticoduodenectomy and left nephrectomy 02/19/2018,[id number], 2/18 lymph nodes positive, lymphovascular and perineural invasion present, mild to moderate treatment effect, tumor involved adherent portal vein with negative portal vein margins Cycle 1 adjuvant gemcitabine/Abraxane 04/25/2018 Cycle 2 adjuvant gemcitabine/Abraxane 05/16/2018 Cycle 3 adjuvant gemcitabine/Abraxane 05/31/2018 Cycle 4 adjuvant gemcitabine/Abraxane 06/13/2018 Cycle 5 adjuvant gemcitabine/Abraxane 06/27/2018 Cycle 6 adjuvant gemcitabine/Abraxane 07/11/2018   Left renal mass consistent with a renal cell carcinoma seen on CT 07/21/2017 and MRI 07/28/2017 Left nephrectomy 02/19/2018- 6 cm clear cell carcinoma, Fuhrman grade 2, negative resection margins   3.   Severe aortic stenosis-TAVR 08/22/2017   4.   Coronary artery disease, status post myocardial infarction March 2018, status post placement of coronary stents; MI December 2020 status post placement of a new stent.   5.   Diabetes   6.   History of tobacco use   7.   Hypertension   8.   Port-A-Cath placement 09/15/2017, Dr. Barry Dienes   9.  Diarrhea- potentially related to pancreas insufficiency, he began a trial of pancreatic enzyme replacement 05/16/2018: Diarrhea resolved   10.  Dehydration on 03/19/2018-status treatments with IV fluids for past 2 weeks Admission 04/03/2018 with dehydration and hypotension Persistent orthostatic  hypotension- compression stockings and Florinef added 04/10/2018 improved, Florinef  discontinued   11.  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  Renal insufficiency   13.  Mild thrombocytopenia-chronic   14.  Hospitalization with GI bleed 12/30/2019 through 01/05/2020.  Upper endoscopy on 01/02/2020 showed an actively bleeding anastomotic ulcer.  Upper endoscopy 03/06/2020-ulcer completely healed.    Disposition: Manuel Olson presents today in an unscheduled visit for evaluation of increased weakness and dizziness.  His wife reports bloody/black bowel movements for the past several days, diarrhea and hypotension.  We reviewed the CBC which shows a hemoglobin of 5.9.  This compares to 9.2 on 04/20/2021.  They understand symptoms/findings are consistent with an acute GI bleed.  He is significantly symptomatic.  He will need to be admitted to the hospital.  We have contacted the emergency room at [place] to alert them of the situation and are now in the process of transporting him to the ED so he can be stabilized and arrangements made for admission.  He is established with Dr. [name], [facility] gastroenterology.  We will make them aware of the admission.  Patient seen with Dr. Benay Spice.  Manuel Olson ANP/GNP-BC   04/26/2021  2:34 PM This was a shared visit with Manuel Olson.  Manuel Olson was interviewed and examined.  He has severe symptomatic anemia, likely secondary to upper GI bleeding.  There has been a significant drop in the hemoglobin over the past week.  We will refer him to the emergency room.  He will need to be admitted for further evaluation and transfusion support.  I was present for greater than 50% of today's visit.  I performed medical decision making.  Julieanne Manson, MD

## 2021-04-26 NOTE — Telephone Encounter (Addendum)
Late Note-TC from Pt's wife stating Pt is weak and dizzy and thinks he is losing blood. Discussed with Dr. Benay Spice Pt put on Lisa T schedule today.

## 2021-04-26 NOTE — ED Notes (Addendum)
Pt spoke with admitting MD. Pt now agreeable to stay. All questions and concerns addressed at this time.

## 2021-04-27 DIAGNOSIS — D62 Acute posthemorrhagic anemia: Secondary | ICD-10-CM

## 2021-04-27 DIAGNOSIS — Z952 Presence of prosthetic heart valve: Secondary | ICD-10-CM

## 2021-04-27 DIAGNOSIS — C250 Malignant neoplasm of head of pancreas: Secondary | ICD-10-CM

## 2021-04-27 DIAGNOSIS — N1831 Chronic kidney disease, stage 3a: Secondary | ICD-10-CM

## 2021-04-27 DIAGNOSIS — I2510 Atherosclerotic heart disease of native coronary artery without angina pectoris: Secondary | ICD-10-CM

## 2021-04-27 DIAGNOSIS — Z794 Long term (current) use of insulin: Secondary | ICD-10-CM

## 2021-04-27 DIAGNOSIS — E43 Unspecified severe protein-calorie malnutrition: Secondary | ICD-10-CM

## 2021-04-27 DIAGNOSIS — I10 Essential (primary) hypertension: Secondary | ICD-10-CM

## 2021-04-27 DIAGNOSIS — E119 Type 2 diabetes mellitus without complications: Secondary | ICD-10-CM

## 2021-04-27 DIAGNOSIS — Z8616 Personal history of COVID-19: Secondary | ICD-10-CM

## 2021-04-27 LAB — BASIC METABOLIC PANEL
Anion gap: 7 (ref 5–15)
BUN: 28 mg/dL — ABNORMAL HIGH (ref 8–23)
CO2: 22 mmol/L (ref 22–32)
Calcium: 8.6 mg/dL — ABNORMAL LOW (ref 8.9–10.3)
Chloride: 100 mmol/L (ref 98–111)
Creatinine, Ser: 1.34 mg/dL — ABNORMAL HIGH (ref 0.61–1.24)
GFR, Estimated: 55 mL/min — ABNORMAL LOW (ref >60)
Glucose, Bld: 159 mg/dL — ABNORMAL HIGH (ref 70–99)
Potassium: 4.5 mmol/L (ref 3.5–5.1)
Sodium: 129 mmol/L — ABNORMAL LOW (ref 135–145)

## 2021-04-27 LAB — CBC
HCT: 26.4 % — ABNORMAL LOW (ref 39.0–52.0)
Hemoglobin: 8.5 g/dL — ABNORMAL LOW (ref 13.0–17.0)
MCH: 29.2 pg (ref 26.0–34.0)
MCHC: 32.2 g/dL (ref 30.0–36.0)
MCV: 90.7 fL (ref 80.0–100.0)
Platelets: 217 10*3/uL (ref 150–400)
RBC: 2.91 MIL/uL — ABNORMAL LOW (ref 4.22–5.81)
RDW: 16.9 % — ABNORMAL HIGH (ref 11.5–15.5)
WBC: 11.9 10*3/uL — ABNORMAL HIGH (ref 4.0–10.5)
nRBC: 0 % (ref 0.0–0.2)

## 2021-04-27 LAB — GLUCOSE, CAPILLARY
Glucose-Capillary: 122 mg/dL — ABNORMAL HIGH (ref 70–99)
Glucose-Capillary: 157 mg/dL — ABNORMAL HIGH (ref 70–99)
Glucose-Capillary: 153 mg/dL — ABNORMAL HIGH (ref 70–99)
Glucose-Capillary: 180 mg/dL — ABNORMAL HIGH (ref 70–99)
Glucose-Capillary: 142 mg/dL — ABNORMAL HIGH (ref 70–99)

## 2021-04-27 LAB — HEMOGLOBIN AND HEMATOCRIT, BLOOD
HCT: 25.1 % — ABNORMAL LOW (ref 39.0–52.0)
HCT: 25 % — ABNORMAL LOW (ref 39.0–52.0)
Hemoglobin: 8.4 g/dL — ABNORMAL LOW (ref 13.0–17.0)
Hemoglobin: 8.2 g/dL — ABNORMAL LOW (ref 13.0–17.0)

## 2021-04-27 LAB — MAGNESIUM: Magnesium: 2.1 mg/dL (ref 1.7–2.4)

## 2021-04-27 LAB — CBG MONITORING, ED: Glucose-Capillary: 153 mg/dL — ABNORMAL HIGH (ref 70–99)

## 2021-04-27 LAB — PREPARE RBC (CROSSMATCH)

## 2021-04-27 MED ORDER — LABETALOL HCL 5 MG/ML IV SOLN
10.0000 mg | INTRAVENOUS | Status: DC | PRN
Start: 2021-04-27 — End: 2021-05-03
  Administered 2021-05-01: 10 mg via INTRAVENOUS
  Filled 2021-04-27: qty 4

## 2021-04-27 MED ORDER — METOPROLOL TARTRATE 5 MG/5ML IV SOLN: 2.5000 mg | Freq: Four times a day (QID) | INTRAVENOUS | Status: DC

## 2021-04-27 MED ORDER — LOSARTAN POTASSIUM 25 MG PO TABS
25.0000 mg | ORAL_TABLET | Freq: Every day | ORAL | Status: DC
  Administered 2021-04-27 – 2021-04-29 (×3): 25 mg via ORAL
  Filled 2021-04-27 (×3): qty 1

## 2021-04-27 NOTE — Progress Notes (Signed)
PROGRESS NOTE  Manuel Olson GYJ:[id number] DOB: May 16, 1946   PCP: [name], MD  Patient is from: Home.  DOA: 04/26/2021 LOS: 1  Chief complaints:  Chief Complaint  Patient presents with   Weakness    From cancer center with low HGB level     Brief Narrative / Interim history: 75 year old M with PMH of pancreatic cancer s/p Whipple, RCC s/p right nephrectomy, DM-2, CAD s/p DES in 2018 and 2020, TAVR and CKD-3 presenting with generalized weakness, melena, shortness of breath and dizziness, and admitted for ABLA with Hgb of 5.9 in the setting of possible upper GI bleed.  Hemoccult positive.  Incidentally tested positive for COVID-19.  Not hypoxic.  CXR without acute finding.  Transfused 2 units with appropriate response.  GI consulted, and planning to scope.  Plavix on hold.  Last dose the morning of 8/15.  Subjective: Seen and examined earlier this morning.  No major events overnight of this morning.  Feels "much better".  Denies chest pain, dyspnea, cough, dizziness, GI or UTI symptoms.  Heart some fresh red blood in stool this morning.   Objective: Vitals:   04/27/21 0345 04/27/21 0534 04/27/21 0625 04/27/21 0956  BP: (!) 166/77 (!) 161/88 (!) 160/82 (!) 161/90  Pulse: 73 82 80 87  Resp: 19 18 18 18   Temp: 98 F (36.7 C) 98.6 F (37 C) 98.5 F (36.9 C) 98.2 F (36.8 C)  TempSrc: Axillary Oral Oral Oral  SpO2: 99% 100% 100% 100%  Weight:      Height:        Intake/Output Summary (Last 24 hours) at 04/27/2021 1302 Last data filed at 04/27/2021 1027 Gross per 24 hour  Intake 827.78 ml  Output 800 ml  Net 27.78 ml   Filed Weights   04/26/21 1508  Weight: 52.6 kg    Examination:  GENERAL: Frail and chronically ill-appearing..  Nontoxic. HEENT: MMM.  Vision and hearing grossly intact.  NECK: Supple.  No apparent JVD.  RESP: 100% on 2 L.  No IWOB.  Fair aeration bilaterally. CVS:  RRR. Heart sounds normal.  ABD/GI/GU: BS+. Abd soft.  RLQ tenderness.  No  rebound or guarding. MSK/EXT:  Moves extremities.  Significant muscle mass and subcu fat loss. SKIN: no apparent skin lesion or wound NEURO: Awake, alert and oriented appropriately.  No apparent focal neuro deficit. PSYCH: Calm. Normal affect.   Procedures:  None  Microbiology summarized: 8/15-COVID-19 PCR positive.  Assessment & Plan: Symptomatic ABLA due to acute GI bleed-significant drop in Hgb.  Appropriate response after 2 units.  Symptoms improved.  Hemodynamically stable. Recent Labs    05/26/20 1003 08/25/20 1000 10/07/20 1144 02/16/21 0927 02/22/21 0641 03/04/21 1924 04/20/21 1129 04/26/21 1331 04/26/21 1552 04/27/21 0936  HGB 11.9* 12.6* 12.9* 12.3* 11.1* 11.9* 9.2* 5.9* 5.9* 8.5*  -Continue monitoring H&H -Continue holding Plavix.  Last dose the morning of 8/15.  Last DES stent placed in 2020.  -Continue IV Protonix -Remains n.p.o. -CT angio abdomen and pelvis if brisk bleeding.  History CAD s/p DES in 2018 and 2020-no anginal symptoms.  Dyspnea likely due to anemia, are resolved. -Resume home losartan-patient has not been taking this at home.  Essential hypertension: BP elevated. -Losartan as above -IV labetalol as needed  IDDM-2 with hyperglycemia and CKD-3A: A1c 7.2% on 03/05/2021. No results for input(s): HGBA1C in the last 72 hours. Recent Labs  Lab 04/27/21 0030 04/27/21 0322 04/27/21 0810 04/27/21 1114  GLUCAP 153* 122* 157* 153*  -Continue SSI-renal  while NPO  CKD-3A/azotemia: Creatinine seems to be at baseline.  Azotemia likely from upper GI bleed. Recent Labs    02/16/21 0927 02/16/21 1141 02/22/21 0641 03/04/21 1924 03/05/21 0037 03/05/21 0140 03/06/21 0113 03/07/21 0055 04/26/21 1552 04/27/21 0936  BUN 22 21 26* 22 22 21 16 20  30* 28*  CREATININE 1.49 1.41* 1.47* 1.62* 1.56* 1.46* 1.24 1.30* 1.27* 1.34*  -Continue monitoring  COVID-19 infection-tested positive in June 2022.  Shouldn't have been retested. -Discontinue isolation  precautions.  History of severe AS s/p TAVR: Stable  History of pancreatic cancer s/p Whipple -Resume home Creon once he started taking p.o.  Severe malnutrition-has significant muscle mass and subcu fat loss on exam.  Noted Megace on his home medication.  This could increase his risk of VTE. Body mass index is 17.64 kg/m. -Consider liberating diet once he start taking p.o. -Consult dietitian once he started taking p.o.     Pressure Injury 04/27/21 Coccyx Mid;Upper Stage 1 -  Intact skin with non-blanchable redness of a localized area usually over a bony prominence. (Active)  04/27/21 0402  Location: Coccyx  Location Orientation: Mid;Upper  Staging: Stage 1 -  Intact skin with non-blanchable redness of a localized area usually over a bony prominence.  Wound Description (Comments):   Present on Admission: Yes   DVT prophylaxis:  SCDs Start: 04/26/21 2037  Code Status: Full code Family Communication: Patient and/or RN. Available if any question.  Level of care: Progressive.  Change level of care to telemetry. Status is: Inpatient  Remains inpatient appropriate because:Ongoing diagnostic testing needed not appropriate for outpatient work up and Inpatient level of care appropriate due to severity of illness  Dispo: The patient is from: Home              Anticipated d/c is to: Home              Patient currently is not medically stable to d/c.   Difficult to place patient No       Consultants:  Gastroenterology   Sch Meds:  Scheduled Meds:  sodium chloride   Intravenous Once   insulin aspart  0-6 Units Subcutaneous Q4H   [START ON 04/30/2021] pantoprazole  40 mg Intravenous Q12H   Continuous Infusions:  pantoprazole 8 mg/hr (04/27/21 0320)   PRN Meds:.acetaminophen **OR** acetaminophen, nitroGLYCERIN  Antimicrobials: Anti-infectives (From admission, onward)    None        I have personally reviewed the following labs and images: CBC: Recent Labs  Lab  04/26/21 1331 04/26/21 1552 04/27/21 0936  WBC 13.6* 12.3* 11.9*  NEUTROABS 11.2* 10.1*  --   HGB 5.9* 5.9* 8.5*  HCT 17.8* 18.2* 26.4*  MCV 90.4 91.5 90.7  PLT 358 348 217   BMP &GFR Recent Labs  Lab 04/26/21 1552 04/27/21 0936  NA 130* 129*  K 4.9 4.5  CL 100 100  CO2 20* 22  GLUCOSE 193* 159*  BUN 30* 28*  CREATININE 1.27* 1.34*  CALCIUM 8.8* 8.6*  MG  --  2.1   Estimated Creatinine Clearance: 34.3 mL/min (A) (by C-G formula based on SCr of 1.34 mg/dL (H)). Liver & Pancreas: Recent Labs  Lab 04/26/21 1552  AST 17  ALT 26  ALKPHOS 194*  BILITOT 0.5  PROT 6.1*  ALBUMIN 3.1*   Recent Labs  Lab 04/26/21 1552  LIPASE <10*   No results for input(s): AMMONIA in the last 168 hours. Diabetic: No results for input(s): HGBA1C in the last 72 hours. Recent  Labs  Lab 04/27/21 0030 04/27/21 0322 04/27/21 0810 04/27/21 1114  GLUCAP 153* 122* 157* 153*   Cardiac Enzymes: No results for input(s): CKTOTAL, CKMB, CKMBINDEX, TROPONINI in the last 168 hours. No results for input(s): PROBNP in the last 8760 hours. Coagulation Profile: Recent Labs  Lab 04/26/21 1726  INR 1.1   Thyroid Function Tests: No results for input(s): TSH, T4TOTAL, FREET4, T3FREE, THYROIDAB in the last 72 hours. Lipid Profile: No results for input(s): CHOL, HDL, LDLCALC, TRIG, CHOLHDL, LDLDIRECT in the last 72 hours. Anemia Panel: No results for input(s): VITAMINB12, FOLATE, FERRITIN, TIBC, IRON, RETICCTPCT in the last 72 hours. Urine analysis:    Component Value Date/Time   COLORURINE YELLOW 03/04/2021 2034   APPEARANCEUR HAZY (A) 03/04/2021 2034   APPEARANCEUR Clear 08/13/2020 0952   LABSPEC 1.021 03/04/2021 2034   PHURINE 5.0 03/04/2021 2034   GLUCOSEU NEGATIVE 03/04/2021 2034   HGBUR NEGATIVE 03/04/2021 2034   BILIRUBINUR NEGATIVE 03/04/2021 2034   BILIRUBINUR Negative 08/13/2020 0952   KETONESUR 5 (A) 03/04/2021 2034   PROTEINUR 30 (A) 03/04/2021 2034   NITRITE NEGATIVE  03/04/2021 2034   LEUKOCYTESUR NEGATIVE 03/04/2021 2034   Sepsis Labs: Invalid input(s): PROCALCITONIN, [place]  Microbiology: Recent Results (from the past 240 hour(s))  Resp Panel by RT-PCR (Flu A&B, Covid) Nasopharyngeal Swab     Status: Abnormal   Collection Time: 04/26/21  3:57 PM   Specimen: Nasopharyngeal Swab; Nasopharyngeal(NP) swabs in vial transport medium  Result Value Ref Range Status   SARS Coronavirus 2 by RT PCR POSITIVE (A) NEGATIVE Final    Comment: RESULT CALLED TO, READ BACK BY AND VERIFIED WITH: PATTY DOWD RN [id number] [facility] 04/26/21  (NOTE) SARS-CoV-2 target nucleic acids are DETECTED.  The SARS-CoV-2 RNA is generally detectable in upper respiratory specimens during the acute phase of infection. Positive results are indicative of the presence of the identified virus, but do not rule out bacterial infection or co-infection with other pathogens not detected by the test. Clinical correlation with patient history and other diagnostic information is necessary to determine patient infection status. The expected result is Negative.  Fact Sheet for Patients: EntrepreneurPulse.com.au  Fact Sheet for Healthcare Providers: IncredibleEmployment.be  This test is not yet approved or cleared by the [place] FDA and  has been authorized for detection and/or diagnosis of SARS-CoV-2 by FDA under an Emergency Use Authorization (EUA).  This EUA will remain in effect (meaning this test can be u sed) for the duration of  the COVID-19 declaration under Section 564(b)(1) of the Act, 21 U.S.C. section 360bbb-3(b)(1), unless the authorization is terminated or revoked sooner.     Influenza A by PCR NEGATIVE NEGATIVE Final   Influenza B by PCR NEGATIVE NEGATIVE Final    Comment: (NOTE) The Xpert Xpress SARS-CoV-2/FLU/RSV plus assay is intended as an aid in the diagnosis of influenza from Nasopharyngeal swab specimens and should not be used  as a sole basis for treatment. Nasal washings and aspirates are unacceptable for Xpert Xpress SARS-CoV-2/FLU/RSV testing.  Fact Sheet for Patients: EntrepreneurPulse.com.au  Fact Sheet for Healthcare Providers: IncredibleEmployment.be  This test is not yet approved or cleared by the [place] FDA and has been authorized for detection and/or diagnosis of SARS-CoV-2 by FDA under an Emergency Use Authorization (EUA). This EUA will remain in effect (meaning this test can be used) for the duration of the COVID-19 declaration under Section 564(b)(1) of the Act, 21 U.S.C. section 360bbb-3(b)(1), unless the authorization is terminated or revoked.  Performed at [facility], [address], [place], [place] [address]     Radiology Studies: DG Chest Portable 1 View  Result Date: 04/26/2021 CLINICAL DATA:  Shortness of breath. Pt is here for weakness and dizziness from cancer center EXAM: PORTABLE CHEST 1 VIEW COMPARISON:  Chest x-ray 03/04/2021. FINDINGS: The heart size and mediastinal contours are unchanged. Status post aortic valve replacement. Aortic calcification. Hyperinflation. No focal consolidation. Coarsened interstitial markings with no overt pulmonary edema. No pleural effusion. No pneumothorax. No acute osseous abnormality. IMPRESSION: 1. No active disease. 2. Aortic Atherosclerosis (ICD10-I70.0) and Emphysema (ICD10-J43.9). Electronically Signed   By: Iven Finn M.D.   On: 04/26/2021 16:37      Arris Meyn T. [place]  If 7PM-7AM, please contact night-coverage [email or web address] 04/27/2021, 1:02 PM

## 2021-04-27 NOTE — Plan of Care (Signed)
  Problem: Education: Goal: Knowledge of General Education information will improve Description Including pain rating scale, medication(s)/side effects and non-pharmacologic comfort measures Outcome: Progressing   Problem: Health Behavior/Discharge Planning: Goal: Ability to manage health-related needs will improve Outcome: Progressing   

## 2021-04-27 NOTE — Progress Notes (Signed)
Pt airborne/contact PPE discontinued per MD  order, last active date 6/13, no longer in active phase. SRP, RN

## 2021-04-27 NOTE — Plan of Care (Signed)
  Problem: Education: Goal: Knowledge of General Education information will improve Description: Including pain rating scale, medication(s)/side effects and non-pharmacologic comfort measures Outcome: Progressing   Problem: Clinical Measurements: Goal: Will remain free from infection Outcome: Progressing Goal: Diagnostic test results will improve Outcome: Progressing Goal: Respiratory complications will improve Outcome: Progressing

## 2021-04-27 NOTE — Consult Note (Addendum)
Referring Provider: Dr. Gean Birchwood  Primary Care Physician:  [name], MD Primary Gastroenterologist:  Dr.Daniel [name]   Reason for Consultation:  Anemia, upper GI bleed/melena   HPI: Manuel Olson is a 75 y.o. 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   He was seen by [name] NP at the [facility] yesterday and he reported passing maroon bloody colored loose stools, not black stools, for the past few days with associated RLQ pain, weakness and dizziness. His BP was 94/63 and HR 55. Labs in office showed a Hg level of 5.9 down from Hg level of 9.2 on 04/20/2021. He was sent directly to [facility] ED.Labs in the ED showed WBC 12.3. Hg 5.9. BUN 30 ( BUN 20 on 03/07/2021). Na+ 130. Cr. 1.27 ( Cr  130 on 03/07/2021). Normal LFTs. Troponin 31 and 29. INR 1.1. SARS Coronavirus 2 positive, he is asymptomatic. Note Cherly Anderson was positive also on 02/22/2021, too soon for retesting therefore Covid isolation removed.  Transfused 2 units PRBCs.  Posttransfusion H&H pending.  Started on PPI infusion.  He passed a moderate amount of maroon colored bloody stool earlier this am and a second similar maroon colored stool with brighter red blood on the toilet tissue at 10 am. He has mild RLQ discomfort.  No current dizziness when he ambulated to the bathroom.  No chest pain or palpitations.  His last dose of Plavix was taken at 8 AM on 04/26/2021. No NSAID use. 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   He denies ever having a screening colonoscopy.  No dysphagia or GERD symptoms.   He has a prior history of an upper GI bleed which required hospital admission 01/01/2020. He underwent an EGD 01/02/2020 which showed an actively bleeding anastomotic ulcer which was successfully injected with epi and 3 clips were placed with achieving hemostasis. H. Pylori stool antigen 01/04/2020 was negative. A repeat EGD done by Dr. [name] as an outpatient was done on 03/06/2020 which showed typical post Whipple anatomy, 3 endoclips were still adherent at the [place] anastomosis and the prior GJ ulcer was completely healed. He developed intermittent abdominal pain with some weight loss and therefore an abd/pelvic CT scan was done 10/15/2020 which did not show any evidence of recurrent pancreatic or renal cell cancer or metastatic disease. 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 He underwent an EGD on 12/16/2020 which showed mild edema to one area from the [place] anastomosis site without any evidence of an ulcer, biopsies were unremarkable.   Past GI procedures:   EGD 12/16/2020: - Typical post Whipple anatomy. 3 endoclips remain adherent to the GE anastomosis. No sign of ulcer or obvious foreign body reaction at the site. The mucosa at the anastomosis was fairly normal appearing, one region was a bit more edematous that the rest and I biopsied this with forceps (not overtly neoplastic appearing). - Afferent and efferent limbs were  normal appearing. - The examination was otherwise normal. Biopsy results: Surgical [P], abnormal mucosa at gastric whipple anastomosis - HYPERPLASTIC GASTRIC MUCOSA AND UNREMARKABLE DUODENAL MUCOSA CONSISTENT WITH ANASTOMOSIS SITE. NO MALIGNANCY IDENTIFIED.  EGD 03/06/2020: - Typical post Whipple anatomy. Three endoclips still adherent at [place] anastomosis. The underlying ulcer  at the site is completely healed. - Affterent and efferent limbs were normal appearing. - Instructed to stay on PPI bid and oral iron   EGD 01/02/2020 by Dr. [name]: The examined esophagus was normal except for small hiatal hernia. Examination of the stomach was limited due to presence of food/vegetable material. Evidence of a pylorus-sparing Whipple was found in the stomach. 1 cm deep ulcer with active oozing was noted at gastrojejunostomy anastomosis. Photodocumentation is obtained. Area was successfully injected with 5 mL of a 1:10,000 solution of epinephrine for hemostasis. Coagulation for hemostasis using 7 Fr monopolar probe was successful. For hemostasis, three hemostatic clips were successfully placed (MR conditional). There was no bleeding at the end of the procedure. Both limbs of jejunum were intubated. These were normal.  ECHO 07/16/2020: 1. Left ventricular ejection fraction, by estimation, is 45 to 50%. The left ventricle has mildly decreased function. The left ventricle demonstrates global hypokinesis. Left ventricular diastolic parameters are consistent with Grade I diastolic dysfunction (impaired relaxation). Elevated left atrial pressure. 2. Right ventricular systolic function is normal. The right ventricular size is normal. There is normal pulmonary artery systolic pressure. 3. Left atrial size was mildly dilated. 4. The mitral valve is normal in structure. No evidence of mitral valve regurgitation. No evidence of mitral stenosis. 5. Edwards Sapien 3 THV size 26 mm is in the AV position with normal function. . The aortic valve has been repaired/replaced. There is mild calcification of the aortic valve. There is mild thickening of the aortic valve. Aortic valve regurgitation is not visualized. No aortic stenosis is present. 6. The inferior vena cava is normal in size with greater than 50% respiratory variability, suggesting right atrial pressure of 3 mmHg   Past  Medical History:  Diagnosis Date   Arthritis    back   Blood transfusion without reported diagnosis    CAD (coronary artery disease)    a. 11/2016 in [place]: STEMI s/p DES to mid LAD, DES to diagonal, DES x 2 to proximal and mid RCA b. 08/2019: NSTEMI with DES to distal RCA   Essential hypertension    Full dentures    GERD (gastroesophageal reflux disease)    History of stroke    Hyperlipidemia    Myocardial infarction [facility])    Pancreatic cancer ([place])    Pneumonia    Renal cell carcinoma ([place])    S/P TAVR (transcatheter aortic valve replacement)    a. 08/2017:  Edwards Sapien 3 THV (size 26 mm, model #9600CM26A , serial # [id number])   Severe aortic stenosis    a. 08/2017: s/p TAVR by Dr. Angelena Form and Dr. Cyndia Bent. 2021 NO AVS on echo   Type 2 diabetes mellitus ([place])    Wears glasses    Wears hearing aid     Past Surgical History:  Procedure Laterality Date   APPENDECTOMY     CARDIAC CATHETERIZATION     CORONARY ANGIOGRAPHY N/A 08/23/2019   Procedure: CORONARY ANGIOGRAPHY;  Surgeon: Belva Crome, MD;  Location: [place] [facility];  Service: Cardiovascular;  Laterality: N/A;   CORONARY STENT INTERVENTION N/A 08/23/2019   Procedure: CORONARY STENT INTERVENTION;  Surgeon: Belva Crome, MD;  Location: [place]  [facility];  Service: Cardiovascular;  Laterality: N/A;   CORONARY STENT PLACEMENT     x 4 in March 2018   ESOPHAGOGASTRODUODENOSCOPY (EGD) WITH PROPOFOL N/A 01/02/2020   Procedure: ESOPHAGOGASTRODUODENOSCOPY (EGD) WITH PROPOFOL;  Surgeon: Jackquline Denmark, MD;  Location: [facility];  Service: Endoscopy;  Laterality: N/A;   EUS N/A 08/02/2017   Procedure: UPPER ENDOSCOPIC ULTRASOUND (EUS) RADIAL;  Surgeon: [name], MD;  Location: [facility];  Service: Endoscopy;  Laterality: N/A;   EYE SURGERY Left    HAND SURGERY Bilateral    HEMOSTASIS CLIP PLACEMENT  01/02/2020   Procedure: HEMOSTASIS CLIP PLACEMENT;  Surgeon: Jackquline Denmark, MD;  Location: [facility];  Service:  Endoscopy;;   HERNIA REPAIR Right    HOT HEMOSTASIS N/A 01/02/2020   Procedure: HOT HEMOSTASIS (ARGON PLASMA COAGULATION/BICAP);  Surgeon: Jackquline Denmark, MD;  Location: [facility];  Service: Endoscopy;  Laterality: N/A;   LAPAROSCOPY N/A 02/19/2018   Procedure: DIAGNOSTIC LAPAROSCOPY, ERAS PATHWAY;  Surgeon: [name], MD;  Location: [place];  Service: General;  Laterality: N/A;   MULTIPLE TOOTH EXTRACTIONS     NEPHRECTOMY Left 02/19/2018   Procedure: OPEN LEFT RADICAL NEPHRECTOMY;  Surgeon: Cleon Gustin, MD;  Location: [place];  Service: Urology;  Laterality: Left;   PORT-A-CATH REMOVAL N/A 06/13/2019   Procedure: PORT REMOVAL;  Surgeon: [name], MD;  Location: [place];  Service: General;  Laterality: N/A;   PORTACATH PLACEMENT N/A 09/15/2017   Procedure: INSERTION PORT-A-CATH;  Surgeon: [name], MD;  Location: [place];  Service: General;  Laterality: N/A;   RIGHT/LEFT HEART CATH AND CORONARY ANGIOGRAPHY N/A 07/05/2017   Procedure: RIGHT/LEFT HEART CATH AND CORONARY ANGIOGRAPHY;  Surgeon: [name], MD;  Location: [place] [facility];  Service: Cardiovascular;  Laterality: N/A;   SCLEROTHERAPY  01/02/2020   Procedure: SCLEROTHERAPY;  Surgeon: Jackquline Denmark, MD;  [facility]: [facility];  Service: Endoscopy;;   SHOULDER ARTHROSCOPY WITH ROTATOR CUFF REPAIR Left    TEE WITHOUT CARDIOVERSION N/A 08/22/2017   Procedure: TRANSESOPHAGEAL ECHOCARDIOGRAM (TEE);  Surgeon: Burnell Blanks, MD;  Location: [place];  Service: Open Heart Surgery;  Laterality: N/A;   TRANSCATHETER AORTIC VALVE REPLACEMENT, TRANSFEMORAL N/A 08/22/2017   Procedure: TRANSCATHETER AORTIC VALVE REPLACEMENT, TRANSFEMORAL;  Surgeon: Burnell Blanks, MD;  Location: [place];  Service: Open Heart Surgery;  Laterality: N/A;   UPPER GASTROINTESTINAL ENDOSCOPY     WHIPPLE PROCEDURE N/A 02/19/2018   Procedure: WHIPPLE PROCEDURE;  Surgeon: [name], MD;  Location: [place];  Service:  General;  Laterality: N/A;    Prior to Admission medications   Medication Sig Start Date End Date Taking? Authorizing Provider  acetaminophen (TYLENOL) 325 MG tablet Take 325-650 mg by mouth every 6 (six) hours as needed for mild pain.   Yes [provider]  atorvastatin (LIPITOR) 20 MG tablet TAKE 1 TABLET BY MOUTH ONCE DAILY. Patient taking differently: Take 20 mg by mouth daily. 04/17/20  Yes Satira Sark, MD  clopidogrel (PLAVIX) 75 MG tablet TAKE 1 TABLET BY MOUTH ONCE DAILY WITH BREAKFAST. Patient taking differently: Take 75 mg by mouth daily. 05/20/20  Yes Satira Sark, MD  CREON 36000 units CPEP capsule TAKE 1 CAPSULE BY MOUTH BEFORE EACH MEAL AND SNACK. TAKE UP TO 6 CAPSULES PER DAY. Patient taking differently: Take 36,000 Units by mouth 3 (three) times daily before meals. Pt taking 2 tab before meals. 04/09/19  Yes Ladell Pier, MD  diphenoxylate-atropine (LOMOTIL) 2.5-0.025 MG tablet Take 1 tablet  by mouth 4 (four) times daily as needed for diarrhea or loose stools. 03/08/21  Yes Ghimire, Henreitta Leber, MD  Insulin Glargine (BASAGLAR KWIKPEN) 100 UNIT/ML Inject 8 Units into the skin daily.   Yes [provider]  insulin lispro (HUMALOG KWIKPEN) 100 UNIT/ML KwikPen Inject 5-8 Units into the skin 3 (three) times daily before meals. Inject 5-8 units 3 times a day with meals. Patient taking differently: Inject 3-4 Units into the skin 3 (three) times daily before meals. 12/04/20  Yes Philemon Kingdom, MD  megestrol (MEGACE) 40 MG/ML suspension Take 200 mg by mouth daily as needed (apetite). 02/26/21  Yes [provider]  pantoprazole (PROTONIX) 40 MG tablet Take 1 tablet (40 mg total) by mouth 2 (two) times daily. 11/05/20  Yes [name], MD  Continuous Blood Gluc Sensor (FREESTYLE LIBRE 14 DAY SENSOR) MISC Inject 1 patch into the skin every 14 (fourteen) days.    [provider]  Insulin Pen Needle (PEN NEEDLES) 31G X 6 MM MISC Use as directed  to check blood sugar 4 times a day. 11/10/20   Philemon Kingdom, MD  losartan (COZAAR) 25 MG tablet Take 0.5 tablets (12.5 mg total) by mouth daily. Patient not taking: No sig reported 02/18/21   Ahmed Prima, Fransisco Hertz, PA-C  nitroGLYCERIN (NITROSTAT) 0.4 MG SL tablet Place 1 tablet (0.4 mg total) under the tongue every 5 (five) minutes x 3 doses as needed for chest pain (if no relief after 2nd dose, proceed to the ED for an evaluation or call 911). 01/15/21   Satira Sark, MD    Current Facility-Administered Medications  Medication Dose Route Frequency Provider Last Rate Last Admin   0.9 %  sodium chloride infusion (Manually program via Guardrails IV Fluids)   Intravenous Once Rise Patience, MD   Held at 04/26/21 1951   acetaminophen (TYLENOL) tablet 650 mg  650 mg Oral Q6H PRN Rise Patience, MD       Or   acetaminophen (TYLENOL) suppository 650 mg  650 mg Rectal Q6H PRN Rise Patience, MD       insulin aspart (novoLOG) injection 0-6 Units  0-6 Units Subcutaneous Q4H Rise Patience, MD   1 Units at 04/27/21 0035   nitroGLYCERIN (NITROSTAT) SL tablet 0.4 mg  0.4 mg Sublingual Q5 Min x 3 PRN Rise Patience, MD       [START ON 04/30/2021] pantoprazole (PROTONIX) injection 40 mg  40 mg Intravenous Q12H Rise Patience, MD       pantoprozole (PROTONIX) 80 mg /NS 100 mL infusion  8 mg/hr Intravenous Continuous Rise Patience, MD 10 mL/hr at 04/27/21 0320 8 mg/hr at 04/27/21 0320    Allergies as of 04/26/2021 - Review Complete 04/26/2021  Allergen Reaction Noted   Carvedilol Other (See Comments) 03/04/2021    Family History  Problem Relation Age of Onset   Lupus Mother    CAD Brother    Hypertension Brother    Colon cancer Neg Hx    Esophageal cancer Neg Hx    Rectal cancer Neg Hx    Stomach cancer Neg Hx     Social History   Socioeconomic History   Marital status: Married    Spouse name: Not on file   Number of children: Not on file   Years  of education: Not on file   Highest education level: Not on file  Occupational History   Not on file  Tobacco Use   Smoking status: Former  Packs/day: 1.00    Years: 57.00    Pack years: 57.00    Types: Cigarettes    Start date: 09/12/1958    Quit date: 04/12/2016    Years since quitting: 5.0   Smokeless tobacco: Never  Vaping Use   Vaping Use: Never used  Substance and Sexual Activity   Alcohol use: No   Drug use: No   Sexual activity: Yes  Other Topics Concern   Not on file  Social History Narrative   Not on file   Social Determinants of Health   Financial Resource Strain: Not on file  Food Insecurity: No Food Insecurity   Worried About Running Out of Food in the Last Year: Never true   Ran Out of Food in the Last Year: Never true  Transportation Needs: No Transportation Needs   Lack of Transportation (Medical): No   Lack of Transportation (Non-Medical): No  Physical Activity: Not on file  Stress: Not on file  Social Connections: Not on file  Intimate Partner Violence: Not on file    Review of Systems: See HPI, all other systems reviewed and are negative  Physical Exam: Vital signs in last 24 hours: Temp:  [97.8 F (36.6 C)-98.9 F (37.2 C)] 98 F (36.7 C) (08/16 0345) Pulse Rate:  [55-100] 73 (08/16 0345) Resp:  [11-20] 19 (08/16 0345) BP: (94-167)/(43-83) 166/77 (08/16 0345) SpO2:  [97 %-100 %] 99 % (08/16 0345) Weight:  [50.8 kg-52.6 kg] 52.6 kg (08/15 1508) Last BM Date: 04/24/21 General:  Alert frail-appearing 75 year old male in no acute distress. Head:  Normocephalic and atraumatic. Eyes:  No scleral icterus. Conjunctiva pink. Ears:  Normal auditory acuity. Nose:  No deformity, discharge or lesions. Mouth:  Dentition intact. No ulcers or lesions.  Neck:  Supple. No lymphadenopathy or thyromegaly.  Lungs: Breath sounds clear throughout.  He is on oxygen 2 L nasal cannula. Heart: Regular rate and rhythm, no murmurs. Abdomen: Soft, nondistended.   Mild R LQ tenderness without rebound or guarding.  Aorta above the umbilicus palpable/pulsatile and enlarged.  Midline abdominal scar intact. Rectal: Deferred. Musculoskeletal:  Symmetrical without gross deformities.  Pulses:  Normal pulses noted. Extremities:  Without clubbing or edema. Neurologic:  Alert and  oriented x4. No focal deficits.  Skin:  Intact without significant lesions or rashes. Psych:  Alert and cooperative. Normal mood and affect.  Intake/Output from previous day: 08/15 0701 - 08/16 0700 In: 455.3 [I.V.:70.3; Blood:285; IV Piggyback:100] Out: -  Intake/Output this shift: Total I/O In: 455.3 [I.V.:70.3; Blood:285; IV Piggyback:100] Out: -   Lab Results: Recent Labs    04/26/21 1331 04/26/21 1552  WBC 13.6* 12.3*  HGB 5.9* 5.9*  HCT 17.8* 18.2*  PLT 358 348   BMET Recent Labs    04/26/21 1552  NA 130*  K 4.9  CL 100  CO2 20*  GLUCOSE 193*  BUN 30*  CREATININE 1.27*  CALCIUM 8.8*   LFT Recent Labs    04/26/21 1552  PROT 6.1*  ALBUMIN 3.1*  AST 17  ALT 26  ALKPHOS 194*  BILITOT 0.5   PT/INR Recent Labs    04/26/21 1726  LABPROT 14.5  INR 1.1   Hepatitis Panel No results for input(s): HEPBSAG, HCVAB, HEPAIGM, HEPBIGM in the last 72 hours.    Studies/Results: DG Chest Portable 1 View  Result Date: 04/26/2021 CLINICAL DATA:  Shortness of breath. Pt is here for weakness and dizziness from cancer center EXAM: PORTABLE CHEST 1 VIEW COMPARISON:  Chest x-ray 03/04/2021. FINDINGS:  The heart size and mediastinal contours are unchanged. Status post aortic valve replacement. Aortic calcification. Hyperinflation. No focal consolidation. Coarsened interstitial markings with no overt pulmonary edema. No pleural effusion. No pneumothorax. No acute osseous abnormality. IMPRESSION: 1. No active disease. 2. Aortic Atherosclerosis (ICD10-I70.0) and Emphysema (ICD10-J43.9). Electronically Signed   By: Iven Finn M.D.   On: 04/26/2021 16:37     IMPRESSION/PLAN:  21) 75 year old male with an upper GI bleed/melena on Plavix with anemia and associated dizziness and generalized weakness. Prior history of GIB secondary to an anastomotic GJ ulcer per EGD 12/2012. Admission Hg 5.9. Transfused 2 units of PRBCs.  Posttransfusion CBC pending.  He passed 2 small to moderate volume maroon-colored bloody stools this morning.  Hemodynamically stable.  Last dose of Plavix was taken at 8am on 04/26/2021. -NPO for now -Await posttransfusion H&H results. ADDENDUM: Post transfusion Hg 8.5 at 09:36 am and repeat Hg 8.4 at 1:52pm.  -Transfuse to maintain Hg > 8 -Hold Plavix  -EGD +/- colonoscopy, timing to be verified by Dr. [name] -Continue PPI infusion -Consider abdominal/pelvic CT angiogram if brisk bleed develop -IV fluids and pain management per the hospitalist  2) CAD s/p MI x 2 with multiple stents on Plavix. Severe AS s/p TAVR 2018.  Last dose of Plavix was taken at 8 AM on 8/15 at 8 AM -Hold Plavix   3) History of  pancreatic adenocarcinoma s/p Whipple surgery 02/2018 treated with FOLFOX/FOLFIRINOX then adjunct Gemcitabine/Abraxane   4) Renal cell carcinoma 2018 s/p left nephrectomy 02/2018  5) Covid 19 positive 02/2021, repeat Covid 19 tests 04/26/2021 (too soon to retest). No Covid sx. Isolation discontinued.  6) Diabetes   6) CKD   Noralyn Pick  04/27/2021, 06:04AM    Attending physician's note   I have taken an interval history, reviewed the chart and examined the patient. I agree with the Advanced Practitioner's note, impression and recommendations.   GI bleed - UGI vs LGI. HD stable. Adm Hb 5.9 s/p 2U to 8.4.  No recurrent bleeding.  Neg recent EGD 12/2020. H/O UGI bleed d/t anastomotic ulcer 12/2019  CAD s/p MI with multiple stents on Plavix (last dose 8/15)  H/O Pancreatic AdenoCa s/p Whipple 02/2018. T1N1 with 2/18 LN+, Rxed with Adjuvant chemotherapy completed 06/2018. CT 10/2020 no recurrence. Nl CA19-9.  Chronic  diarrhea.  Plan: -Rpt EGD and colon after Plavix washout (for at least 3 days), unless active bleeding -Trend CBC -If brisk bleeding, CTA. -IV Protonix -Advance diet. -D/W pt and pt's wife.    Carmell Austria, MD [facility] [phone]

## 2021-04-28 ENCOUNTER — Inpatient Hospital Stay (HOSPITAL_COMMUNITY): Payer: Medicare Other

## 2021-04-28 ENCOUNTER — Inpatient Hospital Stay: Payer: Medicare Other | Admitting: Nurse Practitioner

## 2021-04-28 ENCOUNTER — Inpatient Hospital Stay: Payer: Medicare Other

## 2021-04-28 DIAGNOSIS — K921 Melena: Secondary | ICD-10-CM

## 2021-04-28 DIAGNOSIS — D649 Anemia, unspecified: Secondary | ICD-10-CM

## 2021-04-28 DIAGNOSIS — U071 COVID-19: Secondary | ICD-10-CM

## 2021-04-28 LAB — CBC
HCT: 24.6 % — ABNORMAL LOW (ref 39.0–52.0)
Hemoglobin: 7.9 g/dL — ABNORMAL LOW (ref 13.0–17.0)
MCH: 29 pg (ref 26.0–34.0)
MCHC: 32.1 g/dL (ref 30.0–36.0)
MCV: 90.4 fL (ref 80.0–100.0)
Platelets: 205 10*3/uL (ref 150–400)
RBC: 2.72 MIL/uL — ABNORMAL LOW (ref 4.22–5.81)
RDW: 16.7 % — ABNORMAL HIGH (ref 11.5–15.5)
WBC: 9.1 10*3/uL (ref 4.0–10.5)
nRBC: 0 % (ref 0.0–0.2)

## 2021-04-28 LAB — HEMOGLOBIN AND HEMATOCRIT, BLOOD
HCT: 30.8 % — ABNORMAL LOW (ref 39.0–52.0)
HCT: 30.1 % — ABNORMAL LOW (ref 39.0–52.0)
HCT: 31.7 % — ABNORMAL LOW (ref 39.0–52.0)
Hemoglobin: 10.3 g/dL — ABNORMAL LOW (ref 13.0–17.0)
Hemoglobin: 10 g/dL — ABNORMAL LOW (ref 13.0–17.0)
Hemoglobin: 10.2 g/dL — ABNORMAL LOW (ref 13.0–17.0)

## 2021-04-28 LAB — RENAL FUNCTION PANEL
Albumin: 2.9 g/dL — ABNORMAL LOW (ref 3.5–5.0)
Anion gap: 8 (ref 5–15)
BUN: 23 mg/dL (ref 8–23)
CO2: 22 mmol/L (ref 22–32)
Calcium: 8.7 mg/dL — ABNORMAL LOW (ref 8.9–10.3)
Chloride: 99 mmol/L (ref 98–111)
Creatinine, Ser: 1.29 mg/dL — ABNORMAL HIGH (ref 0.61–1.24)
GFR, Estimated: 57 mL/min — ABNORMAL LOW (ref >60)
Glucose, Bld: 133 mg/dL — ABNORMAL HIGH (ref 70–99)
Phosphorus: 3.4 mg/dL (ref 2.5–4.6)
Potassium: 4.3 mmol/L (ref 3.5–5.1)
Sodium: 129 mmol/L — ABNORMAL LOW (ref 135–145)

## 2021-04-28 LAB — PREPARE RBC (CROSSMATCH)

## 2021-04-28 LAB — GLUCOSE, CAPILLARY
Glucose-Capillary: 125 mg/dL — ABNORMAL HIGH (ref 70–99)
Glucose-Capillary: 133 mg/dL — ABNORMAL HIGH (ref 70–99)
Glucose-Capillary: 138 mg/dL — ABNORMAL HIGH (ref 70–99)
Glucose-Capillary: 152 mg/dL — ABNORMAL HIGH (ref 70–99)
Glucose-Capillary: 159 mg/dL — ABNORMAL HIGH (ref 70–99)
Glucose-Capillary: 167 mg/dL — ABNORMAL HIGH (ref 70–99)

## 2021-04-28 LAB — MAGNESIUM: Magnesium: 1.9 mg/dL (ref 1.7–2.4)

## 2021-04-28 MED ORDER — PEG-KCL-NACL-NASULF-NA ASC-C 100 G PO SOLR: 1.0000 | Freq: Once | ORAL | Status: DC

## 2021-04-28 MED ORDER — FUROSEMIDE 10 MG/ML IJ SOLN
20.0000 mg | Freq: Once | INTRAMUSCULAR | Status: AC
  Administered 2021-04-28: 20 mg via INTRAVENOUS
  Filled 2021-04-28: qty 2

## 2021-04-28 MED ORDER — PEG-KCL-NACL-NASULF-NA ASC-C 100 G PO SOLR
0.5000 | ORAL | Status: AC
  Administered 2021-04-28 – 2021-04-29 (×2): 100 g via ORAL
  Filled 2021-04-28: qty 1

## 2021-04-28 MED ORDER — DIPHENHYDRAMINE HCL 25 MG PO CAPS
25.0000 mg | ORAL_CAPSULE | Freq: Once | ORAL | Status: AC
  Administered 2021-04-28: 25 mg via ORAL
  Filled 2021-04-28: qty 1

## 2021-04-28 MED ORDER — SODIUM CHLORIDE 0.9% IV SOLUTION: Freq: Once | INTRAVENOUS | Status: AC

## 2021-04-28 MED ORDER — INSULIN ASPART 100 UNIT/ML IJ SOLN
0.0000 [IU] | Freq: Three times a day (TID) | INTRAMUSCULAR | Status: DC
  Administered 2021-04-29 – 2021-04-30 (×2): 1 [IU] via SUBCUTANEOUS
  Administered 2021-04-30 – 2021-05-01 (×2): 2 [IU] via SUBCUTANEOUS
  Administered 2021-05-01 (×2): 1 [IU] via SUBCUTANEOUS
  Administered 2021-05-02: 2 [IU] via SUBCUTANEOUS
  Administered 2021-05-02: 3 [IU] via SUBCUTANEOUS
  Administered 2021-05-03 (×2): 1 [IU] via SUBCUTANEOUS

## 2021-04-28 MED ORDER — ACETAMINOPHEN 325 MG PO TABS
650.0000 mg | ORAL_TABLET | Freq: Once | ORAL | Status: AC
  Administered 2021-04-28: 650 mg via ORAL
  Filled 2021-04-28: qty 2

## 2021-04-28 MED ORDER — TECHNETIUM TC 99M-LABELED RED BLOOD CELLS IV KIT
20.8000 | PACK | Freq: Once | INTRAVENOUS | Status: AC
  Administered 2021-04-28: 20.8 via INTRAVENOUS

## 2021-04-28 NOTE — H&P (View-Only) (Signed)
[facility] Progress Note  CC:   Anemia, upper GI bleed/melena   Subjective: He passed a large volume of medium red to maroon blood per the rectum last night and a small volume of similar blood this am. He is fatigued. No CP or SOB. No dizziness. He continues to have RLQ pain.   Objective:  Vital signs in last 24 hours: Temp:  [97.6 F (36.4 C)-98.6 F (37 C)] 98.6 F (37 C) (08/17 0418) Pulse Rate:  [84-89] 84 (08/17 0418) Resp:  [18-19] 18 (08/17 0418) BP: (132-169)/(81-90) 132/81 (08/17 0418) SpO2:  [98 %-100 %] 98 % (08/17 0418) Last BM Date: 04/28/21 General:   Ill appearing 75 year old male in NAD sitting up in the chair.  Heart: Distant S1,S2, regular rhythm, no murmur.  Pulm:  Breath sounds diminished throughout.  Abdomen: Flat, nondistended. Mild RLQ tenderness without rebound or guarding. + BS x 4 quads. No HSM.  Extremities:  No lower extremity edema. Neurologic:  Alert and  oriented x4. Speech is clear. Moves all extremities.  Psych:  Alert and cooperative. Normal mood and affect.  Intake/Output from previous day: 08/16 0701 - 08/17 0700 In: 595.5 [P.O.:360; I.V.:235.5] Out: 750 [Urine:750] Intake/Output this shift: No intake/output data recorded.  Lab Results: Recent Labs    04/26/21 1552 04/27/21 0936 04/27/21 1352 04/27/21 2110 04/28/21 0505  WBC 12.3* 11.9*  --   --  9.1  HGB 5.9* 8.5* 8.4* 8.2* 7.9*  HCT 18.2* 26.4* 25.1* 25.0* 24.6*  PLT 348 217  --   --  205   BMET Recent Labs    04/26/21 1552 04/27/21 0936 04/28/21 0505  NA 130* 129* 129*  K 4.9 4.5 4.3  CL 100 100 99  CO2 20* 22 22  GLUCOSE 193* 159* 133*  BUN 30* 28* 23  CREATININE 1.27* 1.34* 1.29*  CALCIUM 8.8* 8.6* 8.7*   LFT Recent Labs    04/26/21 1552 04/28/21 0505  PROT 6.1*  --   ALBUMIN 3.1* 2.9*  AST 17  --   ALT 26  --   ALKPHOS 194*  --   BILITOT 0.5  --    PT/INR Recent Labs    04/26/21 1726  LABPROT 14.5  INR 1.1   Hepatitis  Panel No results for input(s): HEPBSAG, HCVAB, HEPAIGM, HEPBIGM in the last 72 hours.  DG Chest Portable 1 View  Result Date: 04/26/2021 CLINICAL DATA:  Shortness of breath. Pt is here for weakness and dizziness from cancer center EXAM: PORTABLE CHEST 1 VIEW COMPARISON:  Chest x-ray 03/04/2021. FINDINGS: The heart size and mediastinal contours are unchanged. Status post aortic valve replacement. Aortic calcification. Hyperinflation. No focal consolidation. Coarsened interstitial markings with no overt pulmonary edema. No pleural effusion. No pneumothorax. No acute osseous abnormality. IMPRESSION: 1. No active disease. 2. Aortic Atherosclerosis (ICD10-I70.0) and Emphysema (ICD10-J43.9). Electronically Signed   By: Iven Finn M.D.   On: 04/26/2021 16:37    Assessment / Plan:  29) 75 year old male with an upper GI bleed/melena on Plavix with anemia and associated dizziness and generalized weakness. Prior history of GIB secondary to an anastomotic GJ ulcer per EGD 12/2012. Admission Hg 5.9. Transfused 2 units of PRBCs.  Post transfusion Hg 8.4 -> 8.2 -> Hg today 7.9. BUN 23. He passed a large volume of medium red to maroon blood per the rectum last night and a smaller amount this morning. He complains of RLQ pain. One unit of PRBCs ordered, nurse at bedside  to start transfusion.  Last dose of Plavix was taken at 8am on 04/26/2021. He remains hemodynamically stable.  -Check H/H post transfusion -Continue to monitor patient for  active GI bleeding  -Tagged RBC scan after blood transfusion completed (CTA deferred due to elevated creatinine and patient has only one kidney). If tagged RBC positive will contact IR for possible emobolization  -EGD and colonoscopy with Dr. [name] 04/29/2021 if RBC scan negative -Clear liquid diet -NPO after midnight -Continue Pantoprazole infusion 64m/lhr -IV fluids and pain management per the hospitalist -Continue to hold Plavix    2) CAD s/p MI x 2 with multiple stents on  Plavix. Severe AS s/p TAVR 2018.  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 No Covid sx. Isolation discontinued.   6) Hyponatremia. Na+ 129. -Repeat BMP in am    7) CKD. Cr 1.34 -> 1.29      Principal Problem:   GI bleed Active Problems:   Cancer of head of pancreas (HCC) s/p Whipple 02/2018   Severe AS S/P TAVR    Essential hypertension, benign   CAD (coronary artery disease)   Insulin dependent type 2 diabetes mellitus ([place]   Acute GI bleeding   COVID-19     LOS: 2 days   CNoralyn Pick 04/28/2021, 8:42 AM   Attending physician's note   I have taken an interval history, reviewed the chart and examined the patient. I agree with the Advanced Practitioner's note, impression and recommendations.   Had another episode of GI bleeding-maroon stools per patient's wife. Hb dropped to 7.9, BUN 23, S/P 1 more unit of PRBC.   Plan: -RBC scan. If +, IR embolization.  -We deferred CTA d/t CKD and pt has only 1 kidney (s/p left nephrectomy d/t RCC). -Otherwise, EGD/colon in AM. -D/W pt and pt's wife -he would have been off Plavix x 2 days. -Trend CBC.  Transfuse if needed.   RCarmell Austria MD LVelora HecklerGI [phone]

## 2021-04-28 NOTE — Progress Notes (Addendum)
[facility] Progress Note  CC:   Anemia, upper GI bleed/melena   Subjective: He passed a large volume of medium red to maroon blood per the rectum last night and a small volume of similar blood this am. He is fatigued. No CP or SOB. No dizziness. He continues to have RLQ pain.   Objective:  Vital signs in last 24 hours: Temp:  [97.6 F (36.4 C)-98.6 F (37 C)] 98.6 F (37 C) (08/17 0418) Pulse Rate:  [84-89] 84 (08/17 0418) Resp:  [18-19] 18 (08/17 0418) BP: (132-169)/(81-90) 132/81 (08/17 0418) SpO2:  [98 %-100 %] 98 % (08/17 0418) Last BM Date: 04/28/21 General:   Ill appearing 75 year old male in NAD sitting up in the chair.  Heart: Distant S1,S2, regular rhythm, no murmur.  Pulm:  Breath sounds diminished throughout.  Abdomen: Flat, nondistended. Mild RLQ tenderness without rebound or guarding. + BS x 4 quads. No HSM.  Extremities:  No lower extremity edema. Neurologic:  Alert and  oriented x4. Speech is clear. Moves all extremities.  Psych:  Alert and cooperative. Normal mood and affect.  Intake/Output from previous day: 08/16 0701 - 08/17 0700 In: 595.5 [P.O.:360; I.V.:235.5] Out: 750 [Urine:750] Intake/Output this shift: No intake/output data recorded.  Lab Results: Recent Labs    04/26/21 1552 04/27/21 0936 04/27/21 1352 04/27/21 2110 04/28/21 0505  WBC 12.3* 11.9*  --   --  9.1  HGB 5.9* 8.5* 8.4* 8.2* 7.9*  HCT 18.2* 26.4* 25.1* 25.0* 24.6*  PLT 348 217  --   --  205   BMET Recent Labs    04/26/21 1552 04/27/21 0936 04/28/21 0505  NA 130* 129* 129*  K 4.9 4.5 4.3  CL 100 100 99  CO2 20* 22 22  GLUCOSE 193* 159* 133*  BUN 30* 28* 23  CREATININE 1.27* 1.34* 1.29*  CALCIUM 8.8* 8.6* 8.7*   LFT Recent Labs    04/26/21 1552 04/28/21 0505  PROT 6.1*  --   ALBUMIN 3.1* 2.9*  AST 17  --   ALT 26  --   ALKPHOS 194*  --   BILITOT 0.5  --    PT/INR Recent Labs    04/26/21 1726  LABPROT 14.5  INR 1.1   Hepatitis  Panel No results for input(s): HEPBSAG, HCVAB, HEPAIGM, HEPBIGM in the last 72 hours.  DG Chest Portable 1 View  Result Date: 04/26/2021 CLINICAL DATA:  Shortness of breath. Pt is here for weakness and dizziness from cancer center EXAM: PORTABLE CHEST 1 VIEW COMPARISON:  Chest x-ray 03/04/2021. FINDINGS: The heart size and mediastinal contours are unchanged. Status post aortic valve replacement. Aortic calcification. Hyperinflation. No focal consolidation. Coarsened interstitial markings with no overt pulmonary edema. No pleural effusion. No pneumothorax. No acute osseous abnormality. IMPRESSION: 1. No active disease. 2. Aortic Atherosclerosis (ICD10-I70.0) and Emphysema (ICD10-J43.9). Electronically Signed   By: Iven Finn M.D.   On: 04/26/2021 16:37    Assessment / Plan:  14) 75 year old male with an upper GI bleed/melena on Plavix with anemia and associated dizziness and generalized weakness. Prior history of GIB secondary to an anastomotic GJ ulcer per EGD 12/2012. Admission Hg 5.9. Transfused 2 units of PRBCs.  Post transfusion Hg 8.4 -> 8.2 -> Hg today 7.9. BUN 23. He passed a large volume of medium red to maroon blood per the rectum last night and a smaller amount this morning. He complains of RLQ pain. One unit of PRBCs ordered, nurse at bedside  to start transfusion.  Last dose of Plavix was taken at 8am on 04/26/2021. He remains hemodynamically stable.  -Check H/H post transfusion -Continue to monitor patient for  active GI bleeding  -Tagged RBC scan after blood transfusion completed (CTA deferred due to elevated creatinine and patient has only one kidney). If tagged RBC positive will contact IR for possible emobolization  -EGD and colonoscopy with Dr. [name] 04/29/2021 if RBC scan negative -Clear liquid diet -NPO after midnight -Continue Pantoprazole infusion 58m/lhr -IV fluids and pain management per the hospitalist -Continue to hold Plavix    2) CAD s/p MI x 2 with multiple stents on  Plavix. Severe AS s/p TAVR 2018.  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 No Covid sx. Isolation discontinued.   6) Hyponatremia. Na+ 129. -Repeat BMP in am    7) CKD. Cr 1.34 -> 1.29      Principal Problem:   GI bleed Active Problems:   Cancer of head of pancreas (HCC) s/p Whipple 02/2018   Severe AS S/P TAVR    Essential hypertension, benign   CAD (coronary artery disease)   Insulin dependent type 2 diabetes mellitus ([place]   Acute GI bleeding   COVID-19     LOS: 2 days   CNoralyn Pick 04/28/2021, 8:42 AM   Attending physician's note   I have taken an interval history, reviewed the chart and examined the patient. I agree with the Advanced Practitioner's note, impression and recommendations.   Had another episode of GI bleeding-maroon stools per patient's wife. Hb dropped to 7.9, BUN 23, S/P 1 more unit of PRBC.   Plan: -RBC scan. If +, IR embolization.  -We deferred CTA d/t CKD and pt has only 1 kidney (s/p left nephrectomy d/t RCC). -Otherwise, EGD/colon in AM. -D/W pt and pt's wife -he would have been off Plavix x 2 days. -Trend CBC.  Transfuse if needed.   RCarmell Austria MD LVel[facility]lerGI [phone]

## 2021-04-28 NOTE — Progress Notes (Signed)
PROGRESS NOTE    Manuel Olson  JOI:[id number] DOB: Jun 08, 1946 DOA: 04/26/2021 PCP: [name], MD   Chief Complaint  Patient presents with   Weakness    From cancer center with low HGB level    Brief Narrative:  75 year old M with PMH of pancreatic cancer s/p Whipple, RCC s/p right nephrectomy, DM-2, CAD s/p DES in 2018 and 2020, TAVR and CKD-3 presenting with generalized weakness, melena, shortness of breath and dizziness, and admitted for ABLA with Hgb of 5.9 in the setting of possible upper GI bleed.  Hemoccult positive.  Incidentally tested positive for COVID-19.  Not hypoxic.  CXR without acute finding.  Transfused 2 units with appropriate response.  GI consulted, and planning to scope.  Plavix on hold.  Last dose the morning of 8/15. -Patient with ongoing bleeding, underwent tagged RBC scan 04/28/2021 which was negative. -Patient for colonoscopy/EGD 04/29/2021.     Assessment & Plan:   Principal Problem:   GI bleed Active Problems:   Cancer of head of pancreas ([place]) s/p Whipple 02/2018   Severe AS S/P TAVR    Essential hypertension, benign   CAD (coronary artery disease)   Insulin dependent type 2 diabetes mellitus (HCC)   Acute GI bleeding   COVID-19   #1 acute probable upper GI bleed/symptomatic acute blood loss anemia -??  Etiology. -Patient with history of prior GI bleed secondary to anastomotic GJ ulcer per EGD 12/2012. -Patient noted on presentation to have a hemoglobin of 5.9 status posttransfusion of 2 units packed red blood cells with hemoglobin currently at 7.9. -Patient noted to have had ongoing bloody bowel movements last night and this morning with some complaints of right lower quadrant abdominal pain. -1 unit packed red blood cell transfusion ordered to keep hemoglobin > 8.  -Patient underwent tagged RBC scan this morning which was negative for any active bleed. -Currently on clears. -N.p.o. after midnight in anticipation of EGD/colonoscopy per  GI to be done tomorrow 04/29/2021. -Continue Protonix drip. -Continue to hold Plavix. -Serial H&H -Transfusion threshold hemoglobin < 8. -Per GI.  #2 coronary artery disease status post MI x2 with multiple stents on Plavix/severe AS status post TAVR 2018 -Plavix on hold secondary to problem #1. -Continue Cozaar. -Resume Lipitor on discharge. -GI to advise when Plavix may be resumed. -Outpatient follow-up with cardiology.  3.  History of pancreatic adenocarcinoma status post Whipple surgery 02/2018 -Status posttreatment with FOLFOX/FOLFIRINOX with adjunct gemcitabine/Abraxane. -Resume Creon when placed back on a diet. -Outpatient follow-up with oncology.  4.  Diabetes mellitus type 2 with hyperglycemia/chronic kidney disease stage IIIa -Stable. -Hemoglobin A1c 7.2 (03/05/2021) -CBG 138 this morning. -SSI  5.  Chronic kidney disease stage IIIa -Stable.  6.  COVID-19 infection -Patient noted to have tested + June 2022, currently asymptomatic. -Patient likely should have had repeat testing as he was asymptomatic. -Isolation precautions have been discontinued.  7.  Severe malnutrition -Patient with significant muscle mass and subcutaneous fat loss on examination. -Once tolerating a diet would likely liberalized to a regular diet. -We will need RD consultation when tolerating oral intake.  8.  Pressure ulcer, POA Pressure Injury 04/27/21 Coccyx Mid;Upper Stage 1 -  Intact skin with non-blanchable redness of a localized area usually over a bony prominence. (Active)  04/27/21 0402  Location: Coccyx  Location Orientation: Mid;Upper  Staging: Stage 1 -  Intact skin with non-blanchable redness of a localized area usually over a bony prominence.  Wound Description (Comments):   Present on Admission: Yes  Continue current wound care.       DVT prophylaxis: SCDs Code Status: Full Family Communication: Updated patient and wife at bedside. Disposition:   Status is:  Inpatient  Remains inpatient appropriate because:Inpatient level of care appropriate due to severity of illness  Dispo: The patient is from: Home              Anticipated d/c is to: Home              Patient currently is not medically stable to d/c.   Difficult to place patient No       Consultants:  Gastroenterology: Dr. [name] 04/27/2021  Procedures: Tagged RBC scan 04/28/2021-negative Chest x-ray 04/26/2021 Transfusion 2 units packed red blood cells 04/27/2021 Transfusion 1 unit packed red blood cells 04/28/2021  Antimicrobials:  None   Subjective: Patient sitting up in bed noted to have had bloody bowel movements last night and this morning.  Denies any chest pain.  No shortness of breath.  No abdominal pain.  Frustrated that etiology of bleeding has not been found out yet.  Concerned he has been labeled as COVID infection when he does not recall ever being tested positive for COVID.  Objective: Vitals:   04/28/21 0418 04/28/21 0900 04/28/21 0928 04/28/21 1215  BP: 132/81 112/73 (!) 156/80 (!) 161/87  Pulse: 84 82 84 88  Resp: 18 18 18 16   Temp: 98.6 F (37 C) 98.2 F (36.8 C) 98.2 F (36.8 C) 98.2 F (36.8 C)  TempSrc: Oral Oral Oral Oral  SpO2: 98% 100% 100% 100%  Weight:      Height:        Intake/Output Summary (Last 24 hours) at 04/28/2021 1720 Last data filed at 04/28/2021 1215 Gross per 24 hour  Intake 995.49 ml  Output --  Net 995.49 ml   Filed Weights   04/26/21 1508  Weight: 52.6 kg    Examination:  General exam: Appears calm and comfortble.  Frail. Respiratory system: Clear to auscultation. Respiratory effort normal. Cardiovascular system: S1 & S2 heard, RRR. No JVD, murmurs, rubs, gallops or clicks. No pedal edema. Gastrointestinal system: Abdomen is nondistended, soft and nontender. No organomegaly or masses felt. Normal bowel sounds heard. Central nervous system: Alert and oriented. No focal neurological deficits. Extremities: Symmetric 5 x 5  power. Skin: No rashes, lesions or ulcers Psychiatry: Judgement and insight appear normal. Mood & affect appropriate.     Data Reviewed: I have personally reviewed following labs and imaging studies  CBC: Recent Labs  Lab 04/26/21 1331 04/26/21 1331 04/26/21 1552 04/27/21 0936 04/27/21 1352 04/27/21 2110 04/28/21 0505 04/28/21 1332  WBC 13.6*  --  12.3* 11.9*  --   --  9.1  --   NEUTROABS 11.2*  --  10.1*  --   --   --   --   --   HGB 5.9*   < > 5.9* 8.5* 8.4* 8.2* 7.9* 10.3*  HCT 17.8*  --  18.2* 26.4* 25.1* 25.0* 24.6* 30.8*  MCV 90.4  --  91.5 90.7  --   --  90.4  --   PLT 358  --  348 217  --   --  205  --    < > = values in this interval not displayed.    Basic Metabolic Panel: Recent Labs  Lab 04/26/21 1552 04/27/21 0936 04/28/21 0505  NA 130* 129* 129*  K 4.9 4.5 4.3  CL 100 100 99  CO2 20* 22 22  GLUCOSE 193*  159* 133*  BUN 30* 28* 23  CREATININE 1.27* 1.34* 1.29*  CALCIUM 8.8* 8.6* 8.7*  MG  --  2.1 1.9  PHOS  --   --  3.4    GFR: Estimated Creatinine Clearance: 35.7 mL/min (A) (by C-G formula based on SCr of 1.29 mg/dL (H)).  Liver Function Tests: Recent Labs  Lab 04/26/21 1552 04/28/21 0505  AST 17  --   ALT 26  --   ALKPHOS 194*  --   BILITOT 0.5  --   PROT 6.1*  --   ALBUMIN 3.1* 2.9*    CBG: Recent Labs  Lab 04/28/21 0025 04/28/21 0415 04/28/21 0727 04/28/21 1209 04/28/21 1612  GLUCAP 167* 138* 152* 133* 125*     Recent Results (from the past 240 hour(s))  Resp Panel by RT-PCR (Flu A&B, Covid) Nasopharyngeal Swab     Status: Abnormal   Collection Time: 04/26/21  3:57 PM   Specimen: Nasopharyngeal Swab; Nasopharyngeal(NP) swabs in vial transport medium  Result Value Ref Range Status   SARS Coronavirus 2 by RT PCR POSITIVE (A) NEGATIVE Final    Comment: RESULT CALLED TO, READ BACK BY AND VERIFIED WITH: PATTY DOWD RN [phone] [facility] 04/26/21  (NOTE) SARS-CoV-2 target nucleic acids are DETECTED.  The SARS-CoV-2 RNA is generally  detectable in upper respiratory specimens during the acute phase of infection. Positive results are indicative of the presence of the identified virus, but do not rule out bacterial infection or co-infection with other pathogens not detected by the test. Clinical correlation with patient history and other diagnostic information is necessary to determine patient infection status. The expected result is Negative.  Fact Sheet for Patients: EntrepreneurPulse.com.au  Fact Sheet for Healthcare Providers: IncredibleEmployment.be  This test is not yet approved or cleared by the [place] FDA and  has been authorized for detection and/or diagnosis of SARS-CoV-2 by FDA under an Emergency Use Authorization (EUA).  This EUA will remain in effect (meaning this test can be u sed) for the duration of  the COVID-19 declaration under Section 564(b)(1) of the Act, 21 U.S.C. section 360bbb-3(b)(1), unless the authorization is terminated or revoked sooner.     Influenza A by PCR NEGATIVE NEGATIVE Final   Influenza B by PCR NEGATIVE NEGATIVE Final    Comment: (NOTE) The Xpert Xpress SARS-CoV-2/FLU/RSV plus assay is intended as an aid in the diagnosis of influenza from Nasopharyngeal swab specimens and should not be used as a sole basis for treatment. Nasal washings and aspirates are unacceptable for Xpert Xpress SARS-CoV-2/FLU/RSV testing.  Fact Sheet for Patients: EntrepreneurPulse.com.au  Fact Sheet for Healthcare Providers: IncredibleEmployment.be  This test is not yet approved or cleared by the [place] FDA and has been authorized for detection and/or diagnosis of SARS-CoV-2 by FDA under an Emergency Use Authorization (EUA). This EUA will remain in effect (meaning this test can be used) for the duration of the COVID-19 declaration under Section 564(b)(1) of the Act, 21 U.S.C. section 360bbb-3(b)(1), unless the  authorization is terminated or revoked.  Performed at [facility], [address], [place], [place] [address]          Radiology Studies: NM GI Blood Loss  Result Date: 04/28/2021 CLINICAL DATA:  GI bleed. Large volume of blood per rectum last night. Right lower quadrant abdominal pain. EXAM: NUCLEAR MEDICINE GASTROINTESTINAL BLEEDING SCAN TECHNIQUE: Sequential abdominal images were obtained following intravenous administration of Tc-7mlabeled red blood cells. RADIOPHARMACEUTICALS:  20.8 mCi Tc-975mertechnetate in-vitro labeled red cells. COMPARISON:  Abdominopelvic CT 05/04/2021. FINDINGS: Imaging through 2 hours demonstrates no evidence of active gastrointestinal bleeding. There is normal blood pool and hepatic activity. Mildly prominent activity in the left upper quadrant does not move and likely is gastric. Some bladder activity is noted on the delayed images. IMPRESSION: No evidence of active gastrointestinal bleeding. Electronically Signed   By: Richardean Sale M.D.   On: 04/28/2021 16:30        Scheduled Meds:  sodium chloride   Intravenous Once   insulin aspart  0-6 Units Subcutaneous TID WC   losartan  25 mg Oral Daily   [START ON 04/30/2021] pantoprazole  40 mg Intravenous Q12H   peg 3350 powder  0.5 kit Oral 2 times per day on Wed Thu   Continuous Infusions:  pantoprazole 8 mg/hr (04/28/21 1149)     LOS: 2 days    Time spent: 35 minutes    Irine Seal, MD [facility]   To contact the attending provider between 7A-7P or the covering provider during after hours 7P-7A, please log into the web site [email or web address] and access using universal [place] password for that web site. If you do not have the password, please call the hospital operator.  04/28/2021, 5:20 PM

## 2021-04-28 NOTE — Plan of Care (Signed)
  Problem: Health Behavior/Discharge Planning: Goal: Ability to manage health-related needs will improve Outcome: Progressing   Problem: Clinical Measurements: Goal: Diagnostic test results will improve Outcome: Progressing   Problem: Activity: Goal: Risk for activity intolerance will decrease Outcome: Progressing   Problem: Nutrition: Goal: Adequate nutrition will be maintained Outcome: Progressing

## 2021-04-29 ENCOUNTER — Inpatient Hospital Stay (HOSPITAL_COMMUNITY): Payer: Medicare Other | Admitting: Anesthesiology

## 2021-04-29 ENCOUNTER — Encounter (HOSPITAL_COMMUNITY): Payer: Self-pay | Admitting: Internal Medicine

## 2021-04-29 ENCOUNTER — Encounter (HOSPITAL_COMMUNITY)
Admission: EM | Disposition: A | Discharge status: Home / Self Care | Payer: Self-pay | Source: Home / Self Care | Attending: Internal Medicine

## 2021-04-29 DIAGNOSIS — K254 Chronic or unspecified gastric ulcer with hemorrhage: Principal | ICD-10-CM

## 2021-04-29 HISTORY — PX: SCLEROTHERAPY: SHX6841

## 2021-04-29 HISTORY — PX: HEMOSTASIS CLIP PLACEMENT: SHX6857

## 2021-04-29 HISTORY — PX: ESOPHAGOGASTRODUODENOSCOPY (EGD) WITH PROPOFOL: SHX5813

## 2021-04-29 LAB — CBC
HCT: 27.1 % — ABNORMAL LOW (ref 39.0–52.0)
Hemoglobin: 8.8 g/dL — ABNORMAL LOW (ref 13.0–17.0)
MCH: 29.9 pg (ref 26.0–34.0)
MCHC: 32.5 g/dL (ref 30.0–36.0)
MCV: 92.2 fL (ref 80.0–100.0)
Platelets: 182 10*3/uL (ref 150–400)
RBC: 2.94 MIL/uL — ABNORMAL LOW (ref 4.22–5.81)
RDW: 16.6 % — ABNORMAL HIGH (ref 11.5–15.5)
WBC: 12.8 10*3/uL — ABNORMAL HIGH (ref 4.0–10.5)
nRBC: 0 % (ref 0.0–0.2)

## 2021-04-29 LAB — CBC WITH DIFFERENTIAL/PLATELET
Abs Immature Granulocytes: 0.04 10*3/uL (ref 0.00–0.07)
Basophils Absolute: 0 10*3/uL (ref 0.0–0.1)
Basophils Relative: 0 %
Eosinophils Absolute: 0 10*3/uL (ref 0.0–0.5)
Eosinophils Relative: 0 %
HCT: 28.5 % — ABNORMAL LOW (ref 39.0–52.0)
Hemoglobin: 9.3 g/dL — ABNORMAL LOW (ref 13.0–17.0)
Immature Granulocytes: 1 %
Lymphocytes Relative: 8 %
Lymphs Abs: 0.7 10*3/uL (ref 0.7–4.0)
MCH: 29.2 pg (ref 26.0–34.0)
MCHC: 32.6 g/dL (ref 30.0–36.0)
MCV: 89.3 fL (ref 80.0–100.0)
Monocytes Absolute: 1.3 10*3/uL — ABNORMAL HIGH (ref 0.1–1.0)
Monocytes Relative: 15 %
Neutro Abs: 6.6 10*3/uL (ref 1.7–7.7)
Neutrophils Relative %: 76 %
Platelets: 190 10*3/uL (ref 150–400)
RBC: 3.19 MIL/uL — ABNORMAL LOW (ref 4.22–5.81)
RDW: 16 % — ABNORMAL HIGH (ref 11.5–15.5)
WBC: 8.7 10*3/uL (ref 4.0–10.5)
nRBC: 0 % (ref 0.0–0.2)

## 2021-04-29 LAB — RENAL FUNCTION PANEL
Albumin: 3.1 g/dL — ABNORMAL LOW (ref 3.5–5.0)
Anion gap: 11 (ref 5–15)
BUN: 22 mg/dL (ref 8–23)
CO2: 20 mmol/L — ABNORMAL LOW (ref 22–32)
Calcium: 8.9 mg/dL (ref 8.9–10.3)
Chloride: 101 mmol/L (ref 98–111)
Creatinine, Ser: 1.36 mg/dL — ABNORMAL HIGH (ref 0.61–1.24)
GFR, Estimated: 54 mL/min — ABNORMAL LOW (ref >60)
Glucose, Bld: 132 mg/dL — ABNORMAL HIGH (ref 70–99)
Phosphorus: 3.9 mg/dL (ref 2.5–4.6)
Potassium: 3.7 mmol/L (ref 3.5–5.1)
Sodium: 132 mmol/L — ABNORMAL LOW (ref 135–145)

## 2021-04-29 LAB — HEMOGLOBIN AND HEMATOCRIT, BLOOD
HCT: 30.6 % — ABNORMAL LOW (ref 39.0–52.0)
Hemoglobin: 10 g/dL — ABNORMAL LOW (ref 13.0–17.0)

## 2021-04-29 LAB — GLUCOSE, CAPILLARY
Glucose-Capillary: 161 mg/dL — ABNORMAL HIGH (ref 70–99)
Glucose-Capillary: 113 mg/dL — ABNORMAL HIGH (ref 70–99)
Glucose-Capillary: 145 mg/dL — ABNORMAL HIGH (ref 70–99)

## 2021-04-29 LAB — MAGNESIUM: Magnesium: 1.9 mg/dL (ref 1.7–2.4)

## 2021-04-29 SURGERY — ESOPHAGOGASTRODUODENOSCOPY (EGD) WITH PROPOFOL
Anesthesia: Monitor Anesthesia Care

## 2021-04-29 MED ORDER — PROPOFOL 500 MG/50ML IV EMUL
INTRAVENOUS | Status: DC | PRN
  Administered 2021-04-29: 125 ug/kg/min via INTRAVENOUS

## 2021-04-29 MED ORDER — SODIUM CHLORIDE (PF) 0.9 % IJ SOLN
PREFILLED_SYRINGE | INTRAMUSCULAR | Status: DC | PRN
  Administered 2021-04-29: 4 mL
  Administered 2021-04-29: 2 mL

## 2021-04-29 MED ORDER — LOSARTAN POTASSIUM 25 MG PO TABS
12.5000 mg | ORAL_TABLET | Freq: Every day | ORAL | Status: DC
  Administered 2021-04-30 – 2021-05-03 (×4): 12.5 mg via ORAL
  Filled 2021-04-29 (×4): qty 1

## 2021-04-29 MED ORDER — PANTOPRAZOLE INFUSION (NEW) - SIMPLE MED
8.0000 mg/h | INTRAVENOUS | Status: DC
  Administered 2021-04-29 – 2021-05-01 (×5): 8 mg/h via INTRAVENOUS
  Filled 2021-04-29: qty 80
  Filled 2021-04-29 (×2): qty 100
  Filled 2021-04-29 (×3): qty 80
  Filled 2021-04-29 (×2): qty 100

## 2021-04-29 MED ORDER — SODIUM CHLORIDE 0.9 % IV SOLN: INTRAVENOUS | Status: DC

## 2021-04-29 MED ORDER — PANTOPRAZOLE SODIUM 40 MG IV SOLR: 40.0000 mg | Freq: Two times a day (BID) | INTRAVENOUS | Status: DC

## 2021-04-29 SURGICAL SUPPLY — 25 items

## 2021-04-29 NOTE — Transfer of Care (Signed)
Immediate Anesthesia Transfer of Care Note  Patient: Manuel Olson  Procedure(s) Performed: ESOPHAGOGASTRODUODENOSCOPY (EGD) WITH PROPOFOL SCLEROTHERAPY HEMOSTASIS CLIP PLACEMENT  Patient Location: PACU  Anesthesia Type:MAC  Level of Consciousness: sedated, patient cooperative and responds to stimulation  Airway & Oxygen Therapy: Patient Spontanous Breathing and Patient connected to face mask oxygen  Post-op Assessment: Report given to RN and Post -op Vital signs reviewed and stable  Post vital signs: Reviewed and stable  Last Vitals:  Vitals Value Taken Time  BP 142/44 04/29/21 1540  Temp    Pulse 96 04/29/21 1541  Resp 29 04/29/21 1541  SpO2 100 % 04/29/21 1541  Vitals shown include unvalidated device data.  Last Pain:  Vitals:   04/29/21 1540  TempSrc:   PainSc: 0-No pain      Patients Stated Pain Goal: 0 (95/32/02 3343)  Complications: No notable events documented.

## 2021-04-29 NOTE — Plan of Care (Signed)
  Problem: Clinical Measurements: Goal: Diagnostic test results will improve Outcome: Progressing   Problem: Activity: Goal: Risk for activity intolerance will decrease Outcome: Progressing   Problem: Coping: Goal: Level of anxiety will decrease Outcome: Progressing   Problem: Respiratory: Goal: Complications related to the disease process, condition or treatment will be avoided or minimized Outcome: Progressing

## 2021-04-29 NOTE — Op Note (Signed)
[facility] Patient Name: Manuel Olson Procedure Date: 04/29/2021 MRN: [id number] Attending MD: Jackquline Denmark , MD Date of Birth: 15-Jul-1946 CSN: [id number] Age: 75 Admit Type: Inpatient Procedure:                Upper GI endoscopy Indications:              Recurrent hematochezia. S/P 3U PRBC this adm. 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 T1N1 with                            2/18 LN+, Rxed with Adjuvant chemotherapy completed                            06/2018. CT 10/2020 no recurrence. Nl CA19-9. Providers:                Jackquline Denmark, MD, Particia Nearing, RN, Elspeth Cho                            Tech., Technician, Herbie Drape, CRNA Referring MD:              Medicines:                Monitored Anesthesia Care Complications:            No immediate complications. Estimated Blood Loss:     Estimated blood loss: 100 mL. Procedure:                Pre-Anesthesia Assessment:                           - Prior to the procedure, a History and Physical                            was performed, and patient medications and                            allergies were reviewed. The patient's tolerance of                            previous anesthesia was also reviewed. The risks                            and benefits of the procedure and the sedation                            options and risks were discussed with the patient.                            All questions were answered, and informed consent                            was obtained. Prior Anticoagulants: The patient has  taken Plavix (clopidogrel), last dose was 3 days                            prior to procedure. ASA Grade Assessment: III - A                            patient with severe systemic disease. After                            reviewing the risks and benefits, the patient was                            deemed in satisfactory condition to  undergo the                            procedure.                           After obtaining informed consent, the endoscope was                            passed under direct vision. Throughout the                            procedure, the patient's blood pressure, pulse, and                            oxygen saturations were monitored continuously. The                            [id number]-[id number] ([id number] Olympus endoscope was introduced                            through the mouth, and advanced to the second part                            of duodenum. The upper GI endoscopy was                            accomplished without difficulty. The patient                            tolerated the procedure well. Scope In: Scope Out: Findings:      The examined esophagus was normal.      Whipple's anatomy. One linear anastomotic gastric ulcer with spurting       hemorrhage (Forrest Class Ia) was found at the anastomosis. The lesion       was 8-10 mm in largest dimension. Area was successfully injected with 6       mL of a 1:10,000 solution of epinephrine for drug delivery. To stop       active bleeding, six hemostatic clips were successfully placed (MR       conditional). 3 misfired. There was no bleeding at the end of the       procedure.  3 previous clips were noted at anastomosis away from the above ulcer.      The afferent and efferent limbs were normal. Impression:               -Actively bleeding gastric anastomotic ulcer                            (Forrest Class Ia). Injected. Clips (MR                            conditional) were placed.                           -No specimens collected.                           -Patient at a very high risk for recurrent bleeding. Moderate Sedation:      Not Applicable - Patient had care per Anesthesia. Recommendation:           - Return patient to hospital ward for ongoing care.                            ICU transfer if there is any further  bleeding.                           - Check CBC stat and then every 8hrs x 24 hrs.                            Transfuse to keep Hb>7. We also instructed blood                            bank to keep 2U PRBC ready at all times over next                            24 hours.                           - Give Protonix (pantoprazole): 8 mg/hr IV by                            continuous infusion for 72 hours, then 40 mg p.o.                            BID x 12 weeks, then once a day indefinitely                           - If any further bleeding, IR for embolization.                           - Avoid all nonsteroidals.                           - ?Need for Plavix. May need cardio input.                           -  Only clear liquid diet. Do not advance over the                            next 24 hours.                           - We decided to hold off on colonoscopy.                           - The findings and recommendations were discussed                            with the patient's family. Procedure Code(s):        --- Professional ---                           [phone], Esophagogastroduodenoscopy, flexible,                            transoral; with control of bleeding, any method                           43236, 59, Esophagogastroduodenoscopy, flexible,                            transoral; with directed submucosal injection(s),                            any substance Diagnosis Code(s):        --- Professional ---                           K25.4, Chronic or unspecified gastric ulcer with                            hemorrhage                           K92.2, Gastrointestinal hemorrhage, unspecified CPT copyright 2019 American Medical Association. All rights reserved. The codes documented in this report are preliminary and upon coder review may  be revised to meet current compliance requirements. Jackquline Denmark, MD 04/29/2021 4:02:36 PM This report has been signed electronically. Number of  Addenda: 0

## 2021-04-29 NOTE — Interval H&P Note (Signed)
History and Physical Interval Note:  04/29/2021 2:36 PM  Manuel Olson  has presented today for surgery, with the diagnosis of GI bleed, anemia.  The various methods of treatment have been discussed with the patient and family. After consideration of risks, benefits and other options for treatment, the patient has consented to  Procedure(s): ESOPHAGOGASTRODUODENOSCOPY (EGD) WITH PROPOFOL (N/A) COLONOSCOPY WITH PROPOFOL (N/A) as a surgical intervention.  The patient's history has been reviewed, patient examined, no change in status, stable for surgery.  I have reviewed the patient's chart and labs.  Questions were answered to the patient's satisfaction.     Jackquline Denmark

## 2021-04-29 NOTE — Plan of Care (Signed)
  Problem: Health Behavior/Discharge Planning: Goal: Ability to manage health-related needs will improve Outcome: Progressing   Problem: Education: Goal: Knowledge of General Education information will improve Description: Including pain rating scale, medication(s)/side effects and non-pharmacologic comfort measures Outcome: Progressing   Problem: Clinical Measurements: Goal: Ability to maintain clinical measurements within normal limits will improve Outcome: Progressing   Problem: Skin Integrity: Goal: Risk for impaired skin integrity will decrease Outcome: Progressing

## 2021-04-29 NOTE — Anesthesia Preprocedure Evaluation (Signed)
Anesthesia Evaluation  Patient identified by MRN, date of birth, ID band Patient awake    Reviewed: Allergy & Precautions, NPO status , Patient's Chart, lab work & pertinent test results  Airway Mallampati: II  TM Distance: >3 FB Neck ROM: Full    Dental no notable dental hx.    Pulmonary neg pulmonary ROS, former smoker,    Pulmonary exam normal breath sounds clear to auscultation       Cardiovascular hypertension, + CAD, + Past MI and + Cardiac Stents  Normal cardiovascular exam+ Valvular Problems/Murmurs  Rhythm:Regular Rate:Normal + Systolic murmurs S/P TAVR (transcatheter aortic valve replacement   Neuro/Psych negative neurological ROS  negative psych ROS   GI/Hepatic negative GI ROS, Neg liver ROS,   Endo/Other  diabetes  Renal/GU negative Renal ROS  negative genitourinary   Musculoskeletal negative musculoskeletal ROS (+)   Abdominal   Peds negative pediatric ROS (+)  Hematology  (+) anemia ,   Anesthesia Other Findings   Reproductive/Obstetrics negative OB ROS                             Anesthesia Physical Anesthesia Plan  ASA: 3  Anesthesia Plan: MAC   Post-op Pain Management:    Induction: Intravenous  PONV Risk Score and Plan: 1 and Treatment may vary due to age or medical condition and Propofol infusion  Airway Management Planned: Simple Face Mask  Additional Equipment:   Intra-op Plan:   Post-operative Plan:   Informed Consent: I have reviewed the patients History and Physical, chart, labs and discussed the procedure including the risks, benefits and alternatives for the proposed anesthesia with the patient or authorized representative who has indicated his/her understanding and acceptance.     Dental advisory given  Plan Discussed with: CRNA and Surgeon  Anesthesia Plan Comments:         Anesthesia Quick Evaluation

## 2021-04-29 NOTE — Progress Notes (Addendum)
PROGRESS NOTE    Manuel Olson  HUT:[id number] DOB: [birth year] DOA: 04/26/2021 PCP: [name], MD   Chief Complaint  Patient presents with   Weakness    From cancer center with low HGB level    Brief Narrative:  75 year old M with PMH of pancreatic cancer s/p Whipple, RCC s/p right nephrectomy, DM-2, CAD s/p DES in 2018 and 2020, TAVR and CKD-3 presenting with generalized weakness, melena, shortness of breath and dizziness, and admitted for ABLA with Hgb of 5.9 in the setting of possible upper GI bleed.  Hemoccult positive.  Incidentally tested positive for COVID-19.  Not hypoxic.  CXR without acute finding.  Transfused 2 units with appropriate response.  GI consulted, and planning to scope.  Plavix on hold.  Last dose the morning of 8/15. -Patient with ongoing bleeding, underwent tagged RBC scan 04/28/2021 which was negative. -Patient for colonoscopy/EGD 04/29/2021.     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  Etiology. -Patient with history of prior GI bleed secondary to anastomotic GJ ulcer per EGD 12/2012. -Patient noted on presentation to have a hemoglobin of 5.9 status posttransfusion of total of 3 units packed red blood cells during this hospitalization with hemoglobin currently at 9.3.  -Patient denies any further bloody bowel movements this morning. -Still with complaints of chronic diffuse abdominal pain. -Patient underwent tagged RBC scan 04/28/2021 which was negative for any active bleeding. -Currently n.p.o. in anticipation of EGD/colonoscopy to be done today per GI 04/29/2021. -Continue to hold Plavix. -Continue  Protonix drip then transition to IV PPI twice daily starting tomorrow 04/30/2021. -Place on IV fluids while NPO. -Serial CBCs. -Transfusion threshold hemoglobin < 8.  -Per GI.  2 coronary artery disease status post MI x2 with multiple stents on Plavix/severe AS status post TAVR 2018 -Plavix on hold secondary to problem #1. -Continue Cozaar.   -Lipitor on hold likely resume on discharge.   -GI to advise when Plavix may be resumed.   -Outpatient follow-up with cardiology.   3.  History of pancreatic adenocarcinoma status post Whipple surgery 02/2018 -Status posttreatment with FOLFOX/FOLFIRINOX with adjunct gemcitabine/Abraxane. -Creon to be resumed once patient back on a diet.   -Outpatient follow-up with oncology.  4.  Diabetes mellitus type 2 with hyperglycemia/chronic kidney disease stage IIIa -Stable. -Hemoglobin A1c 7.2 (03/05/2021) -CBG 161 this morning.   -SSI.   5.  Chronic kidney disease stage IIIa -Stable.  6.  COVID-19 infection -Patient noted to have tested + June 2022, currently asymptomatic. -Patient likely should have had repeat testing as he was asymptomatic. -Isolation precautions have been discontinued.  7.  Severe protein calorie malnutrition -Patient with significant muscle mass and subcutaneous fat loss on examination. -Once tolerating a diet would likely liberalized to a regular diet. -We will need RD consultation when tolerating oral intake.  8.  Pressure ulcer, POA Pressure Injury 04/27/21 Coccyx Mid;Upper Stage 1 -  Intact skin with non-blanchable redness of a localized area usually over a bony prominence. (Active)  04/27/21 0402  Location: Coccyx  Location Orientation: Mid;Upper  Staging: Stage 1 -  Intact skin with  non-blanchable redness of a localized area usually over a bony prominence.  Wound Description (Comments):   Present on Admission: Yes  Continue current wound care.  9.  Chronic diarrhea -Patient with chronic diarrhea states general  surgeon increase Creon to 2 tablets with meals. -Once patient is placed back on a diet will resume Creon. -May consider some scheduled question however will defer to GI.       DVT prophylaxis: SCDs Code Status: Full Family Communication: Updated patient and wife at bedside. Disposition:   Status is: Inpatient  Remains inpatient appropriate because:Inpatient level of care appropriate due to severity of illness  Dispo: The patient is from: Home              Anticipated d/c is to: Home              Patient currently is not medically stable to d/c.   Difficult to place patient No       Consultants:  Gastroenterology: Dr. [name] 04/27/2021  Procedures: Tagged RBC scan 04/28/2021-negative Chest x-ray 04/26/2021 Transfusion 2 units packed red blood cells 04/27/2021 Transfusion 1 unit packed red blood cells 04/28/2021  Antimicrobials:  None   Subjective: Patient sleeping but arousable.  Denies any chest pain.  No shortness of breath.  Complains of chronic diffuse abdominal pain.  Per patient and wife patient with chronic diarrhea and states Creon dose adjusted per general surgeon to see whether that may help with diarrhea.  Patient denies any further bloody bowel movements this morning or last night.  Denies any significant lightheadedness.  Complain of generalized weakness.   Objective: Vitals:   04/28/21 0928 04/28/21 1215 04/28/21 2109 04/29/21 0516  BP: (!) 156/80 (!) 161/87 118/70 138/87  Pulse: 84 88 87 92  Resp: 18 16 16 18   Temp: 98.2 F (36.8 C) 98.2 F (36.8 C) 98 F (36.7 C) 97.8 F (36.6 C)  TempSrc: Oral Oral Oral Oral  SpO2: 100% 100% 100% 100%  Weight:      Height:        Intake/Output Summary (Last 24 hours) at 04/29/2021 0926 Last data filed at 04/29/2021 0600 Gross per 24 hour  Intake 861.91 ml  Output --  Net 861.91 ml    Filed Weights   04/26/21 1508  Weight: 52.6 kg    Examination:  General exam: Cachectic.  Frail. Respiratory system:  Lungs clear to auscultation bilaterally.  No wheezes, no crackles, no rhonchi.  Normal respiratory effort.  Speaking in full sentences.   Cardiovascular system: Regular rate rhythm no murmurs rubs or gallops.  No JVD.  No lower extremity edema . Gastrointestinal system: Abdomen is soft, nondistended, some diffuse tenderness to palpation which patient states is chronic.  No rebound.  No guarding.  Positive bowel sounds. Central nervous system: Alert and oriented.  Moving extremities spontaneously.  No focal neurological deficits.   Extremities: Symmetric 5 x 5 power. Skin: No rashes, lesions or ulcers Psychiatry: Judgement and insight appear normal. Mood & affect appropriate.     Data Reviewed: I have personally reviewed following labs and imaging studies  CBC: Recent Labs  Lab 04/26/21 1331 04/26/21 1331 04/26/21 1552 04/27/21 0936 04/27/21 1352 04/28/21 0505 04/28/21 1332 04/28/21 1705 04/28/21 2047 04/29/21 0424  WBC 13.6*  --  12.3* 11.9*  --  9.1  --   --   --  8.7  NEUTROABS 11.2*  --  10.1*  --   --   --   --   --   --  6.6  HGB 5.9*   < > 5.9* 8.5*   < > 7.9* 10.3* 10.0* 10.2* 9.3*  HCT 17.8*  --  18.2* 26.4*   < > 24.6* 30.8* 30.1* 31.7* 28.5*  MCV 90.4  --  91.5 90.7  --  90.4  --   --   --  89.3  PLT 358  --  348 217  --  205  --   --   --  190   < > = values in this interval not displayed.     Basic Metabolic Panel: Recent Labs  Lab 04/26/21 1552 04/27/21 0936 04/28/21 0505 04/29/21 0424  NA 130* 129* 129* 132*  K 4.9 4.5 4.3 3.7  CL 100 100 99 101  CO2 20* 22 22 20*  GLUCOSE 193* 159* 133* 132*  BUN 30* 28* 23 22  CREATININE 1.27* 1.34* 1.29* 1.36*  CALCIUM 8.8* 8.6* 8.7* 8.9  MG  --  2.1 1.9 1.9  PHOS  --   --  3.4 3.9     GFR: Estimated Creatinine Clearance: 33.8 mL/min (A) (by C-G formula based on SCr of 1.36 mg/dL (H)).  Liver Function Tests: Recent Labs  Lab 04/26/21 1552 04/28/21 0505 04/29/21 0424  AST 17  --   --   ALT 26  --   --    ALKPHOS 194*  --   --   BILITOT 0.5  --   --   PROT 6.1*  --   --   ALBUMIN 3.1* 2.9* 3.1*     CBG: Recent Labs  Lab 04/28/21 0727 04/28/21 1209 04/28/21 1612 04/28/21 2107 04/29/21 0757  GLUCAP 152* 133* 125* 159* 161*      Recent Results (from the past 240 hour(s))  Resp Panel by RT-PCR (Flu A&B, Covid) Nasopharyngeal Swab     Status: Abnormal   Collection Time: 04/26/21  3:57 PM   Specimen: Nasopharyngeal Swab; Nasopharyngeal(NP) swabs in vial transport medium  Result Value Ref Range Status   SARS Coronavirus 2 by RT PCR POSITIVE (A) NEGATIVE Final    Comment: RESULT CALLED TO, READ BACK BY AND VERIFIED WITH: PATTY DOWD RN [id number] [facility] 04/26/21  (NOTE) SARS-CoV-2 target nucleic acids are DETECTED.  The SARS-CoV-2 RNA is generally detectable in upper respiratory specimens during the acute phase of infection. Positive results are indicative of the presence of the identified virus, but do not rule out bacterial infection or co-infection with other pathogens not detected by the test. Clinical correlation with patient history and other diagnostic information is necessary to determine patient infection status. The expected result is Negative.  Fact Sheet for Patients: EntrepreneurPulse.com.au  Fact Sheet for Healthcare Providers: IncredibleEmployment.be  This test is not yet approved or cleared by the [place] FDA and  has been authorized for detection and/or diagnosis of SARS-CoV-2 by FDA under an Emergency Use Authorization (EUA).  This EUA will remain in effect (meaning this test can be u sed) for the duration of  the COVID-19 declaration under Section 564(b)(1) of the Act, 21 U.S.C. section 360bbb-3(b)(1), unless the authorization is terminated or revoked sooner.     Influenza A by PCR NEGATIVE NEGATIVE Final   Influenza B by PCR NEGATIVE NEGATIVE Final    Comment: (NOTE) The Xpert Xpress SARS-CoV-2/FLU/RSV plus assay is  intended as an aid in the diagnosis of influenza from Nasopharyngeal swab specimens and should not be used as a sole basis for treatment. Nasal washings and aspirates are unacceptable for Xpert Xpress SARS-CoV-2/FLU/RSV testing.  Fact Sheet for Patients: EntrepreneurPulse.com.au  Fact Sheet for Healthcare Providers: IncredibleEmployment.be  This test is not yet approved or cleared by the [place] FDA and has been authorized for detection and/or diagnosis of SARS-CoV-2 by FDA under an Emergency Use Authorization (EUA). This EUA will remain in effect (meaning this test can be used) for the duration of the COVID-19 declaration under Section 564(b)(1) of the Act, 21 U.S.C. section 360bbb-3(b)(1), unless the authorization is terminated or revoked.  Performed at [facility], [address] [place], [place] [address]           Radiology Studies: NM GI Blood Loss  Result Date: 04/28/2021 CLINICAL DATA:  GI bleed. Large volume of blood per rectum last night. Right lower quadrant abdominal pain. EXAM: NUCLEAR MEDICINE GASTROINTESTINAL BLEEDING SCAN TECHNIQUE: Sequential abdominal images were obtained following intravenous administration of Tc-43mlabeled red blood cells. RADIOPHARMACEUTICALS:  20.8 mCi Tc-946mertechnetate in-vitro labeled red cells. COMPARISON:  Abdominopelvic CT 05/04/2021. FINDINGS: Imaging through 2 hours demonstrates no evidence of active gastrointestinal bleeding. There is normal blood pool and hepatic activity. Mildly prominent activity in the left upper quadrant does not move and likely is gastric. Some bladder activity is noted on the delayed images. IMPRESSION: No evidence of active gastrointestinal bleeding. Electronically Signed   By: WiRichardean Sale.D.   On: 04/28/2021 16:30        Scheduled Meds:  sodium chloride   Intravenous Once   insulin aspart  0-6 Units Subcutaneous TID WC   losartan   25 mg Oral Daily   [START ON 04/30/2021] pantoprazole  40 mg Intravenous Q12H   Continuous Infusions:  pantoprazole 8 mg/hr (04/28/21 2343)     LOS: 3 days    Time spent: 35 minutes    DaIrine SealMD [facility]   To contact the attending provider between 7A-7P or the covering provider during after hours 7P-7A, please log into the web site [email or web address] and access using universal [place] password for that web site. If you do not have the password, please call the hospital operator.  04/29/2021, 9:26 AM

## 2021-04-29 NOTE — Plan of Care (Signed)

## 2021-04-30 ENCOUNTER — Encounter (HOSPITAL_COMMUNITY): Payer: Self-pay | Admitting: Gastroenterology

## 2021-04-30 LAB — TYPE AND SCREEN
ABO/RH(D): A POS
Antibody Screen: NEGATIVE
Unit division: 0
Unit division: 0
Unit division: 0
Unit division: 0
Unit division: 0

## 2021-04-30 LAB — BPAM RBC
Blood Product Expiration Date: 202209082359
Blood Product Expiration Date: 202209082359
Blood Product Expiration Date: 202209082359
Blood Product Expiration Date: 202209082359
Blood Product Expiration Date: 202209092359
ISSUE DATE / TIME: 202208160009
ISSUE DATE / TIME: 202208160326
ISSUE DATE / TIME: 202208170850
Unit Type and Rh: 6200
Unit Type and Rh: 6200
Unit Type and Rh: 6200
Unit Type and Rh: 6200
Unit Type and Rh: 6200

## 2021-04-30 LAB — BASIC METABOLIC PANEL
Anion gap: 11 (ref 5–15)
BUN: 21 mg/dL (ref 8–23)
CO2: 18 mmol/L — ABNORMAL LOW (ref 22–32)
Calcium: 8.7 mg/dL — ABNORMAL LOW (ref 8.9–10.3)
Chloride: 106 mmol/L (ref 98–111)
Creatinine, Ser: 1.46 mg/dL — ABNORMAL HIGH (ref 0.61–1.24)
GFR, Estimated: 49 mL/min — ABNORMAL LOW (ref >60)
Glucose, Bld: 112 mg/dL — ABNORMAL HIGH (ref 70–99)
Potassium: 3.8 mmol/L (ref 3.5–5.1)
Sodium: 135 mmol/L (ref 135–145)

## 2021-04-30 LAB — CBC
HCT: 26.6 % — ABNORMAL LOW (ref 39.0–52.0)
HCT: 30.9 % — ABNORMAL LOW (ref 39.0–52.0)
HCT: 29.4 % — ABNORMAL LOW (ref 39.0–52.0)
Hemoglobin: 8.5 g/dL — ABNORMAL LOW (ref 13.0–17.0)
Hemoglobin: 9.4 g/dL — ABNORMAL LOW (ref 13.0–17.0)
Hemoglobin: 8.6 g/dL — ABNORMAL LOW (ref 13.0–17.0)
MCH: 29.2 pg (ref 26.0–34.0)
MCH: 29.7 pg (ref 26.0–34.0)
MCH: 29.9 pg (ref 26.0–34.0)
MCHC: 32 g/dL (ref 30.0–36.0)
MCHC: 30.4 g/dL (ref 30.0–36.0)
MCHC: 29.3 g/dL — ABNORMAL LOW (ref 30.0–36.0)
MCV: 91.4 fL (ref 80.0–100.0)
MCV: 97.5 fL (ref 80.0–100.0)
MCV: 102.1 fL — ABNORMAL HIGH (ref 80.0–100.0)
Platelets: 186 10*3/uL (ref 150–400)
Platelets: 170 10*3/uL (ref 150–400)
Platelets: 183 10*3/uL (ref 150–400)
RBC: 2.91 MIL/uL — ABNORMAL LOW (ref 4.22–5.81)
RBC: 3.17 MIL/uL — ABNORMAL LOW (ref 4.22–5.81)
RBC: 2.88 MIL/uL — ABNORMAL LOW (ref 4.22–5.81)
RDW: 16.7 % — ABNORMAL HIGH (ref 11.5–15.5)
RDW: 17.2 % — ABNORMAL HIGH (ref 11.5–15.5)
RDW: 17.1 % — ABNORMAL HIGH (ref 11.5–15.5)
WBC: 10.4 10*3/uL (ref 4.0–10.5)
WBC: 14.1 10*3/uL — ABNORMAL HIGH (ref 4.0–10.5)
WBC: 10.8 10*3/uL — ABNORMAL HIGH (ref 4.0–10.5)
nRBC: 0 % (ref 0.0–0.2)
nRBC: 0 % (ref 0.0–0.2)
nRBC: 0 % (ref 0.0–0.2)

## 2021-04-30 LAB — GLUCOSE, CAPILLARY
Glucose-Capillary: 125 mg/dL — ABNORMAL HIGH (ref 70–99)
Glucose-Capillary: 185 mg/dL — ABNORMAL HIGH (ref 70–99)
Glucose-Capillary: 211 mg/dL — ABNORMAL HIGH (ref 70–99)
Glucose-Capillary: 123 mg/dL — ABNORMAL HIGH (ref 70–99)

## 2021-04-30 MED ORDER — PANCRELIPASE (LIP-PROT-AMYL) 12000-38000 UNITS PO CPEP
72000.0000 [IU] | ORAL_CAPSULE | Freq: Three times a day (TID) | ORAL | Status: DC
  Administered 2021-05-02 – 2021-05-03 (×3): 72000 [IU] via ORAL
  Filled 2021-04-30 (×4): qty 6

## 2021-04-30 MED ORDER — ENSURE ENLIVE PO LIQD
237.0000 mL | Freq: Two times a day (BID) | ORAL | Status: DC
  Administered 2021-05-01 – 2021-05-03 (×5): 237 mL via ORAL

## 2021-04-30 MED ORDER — TRAZODONE HCL 50 MG PO TABS
50.0000 mg | ORAL_TABLET | Freq: Once | ORAL | Status: AC
  Administered 2021-04-30: 50 mg via ORAL
  Filled 2021-04-30: qty 1

## 2021-04-30 NOTE — Progress Notes (Addendum)
PROGRESS NOTE    Manuel Olson  RXV:[id number] DOB: [birth year] DOA: 04/26/2021 PCP: [name], MD   Chief Complaint  Patient presents with   Weakness    From cancer center with low HGB level    Brief Narrative:  75 year old M with PMH of pancreatic cancer s/p Whipple, RCC s/p right nephrectomy, DM-2, CAD s/p DES in 2018 and 2020, TAVR and CKD-3 presenting with generalized weakness, melena, shortness of breath and dizziness, and admitted for ABLA with Hgb of 5.9 in the setting of possible upper GI bleed.  Hemoccult positive.  Incidentally tested positive for COVID-19.  Not hypoxic.  CXR without acute finding.  Transfused 2 units with appropriate response.  GI consulted, and planning to scope.  Plavix on hold.  Last dose the morning of 8/15. -Patient with ongoing bleeding, underwent tagged RBC scan 04/28/2021 which was negative. -Patient for colonoscopy/EGD 04/29/2021.     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  Etiology. -Patient with history of prior GI bleed secondary to anastomotic GJ ulcer per EGD 12/2012. -Patient noted on presentation to have a hemoglobin of 5.9 status posttransfusion of total of 3 units packed red blood cells during this hospitalization with hemoglobin currently at 9.4 this morning..  -Patient denies any further bloody bowel movements this morning. -States improvement with upper abdominal pain and currently denies any ongoing upper abdominal pain.  -Patient underwent tagged RBC scan 04/28/2021 which was negative for any active bleeding. -Patient underwent EGD on 04/29/2021 which showed Whipple's anatomy,  1 linear 8 to 10 mm gastric ulcer with spurting hemorrhage status post epi injection and 6 hemostatic clips with successful hemostasis.   -Serial CBCs stable with hemoglobin at 9.4.   -Per GI patient at high risk for recurrent bleeding and if patient rebleeds will require IR evaluation with angio/embolization.   -Continue Protonix drip for 48 to 72 hours and subsequently transition to IV PPI versus oral PPI twice daily.  -Continue to hold Plavix and likely will not resume as patient with increased risk of rebleed and will need to follow-up with GI and cardiology in outpatient setting.. -Currently tolerating clears and diet being advanced to full liquid diet. Per GI.  -Serial CBC with transfusion threshold hemoglobin <8. -Per GI.  2 coronary artery disease status post MI x2 with multiple stents on Plavix/severe AS status post TAVR 2018 -Plavix on hold secondary to problem #1 and likely will continue to hold and not resume until cleared by GI on follow-up in the outpatient setting. -Continue Cozaar.   -Continue to hold Lipitor and resume on discharge.  -Due to high risk of rebleed will discuss with cardiology on further recommendations regarding ongoing use of patient's Plavix or whether to transition to enteric-coated aspirin and further recommendations going forward. -Outpatient follow-up with cardiology.   3.  History of pancreatic adenocarcinoma status post Whipple surgery 02/2018 -Status posttreatment with FOLFOX/FOLFIRINOX with adjunct gemcitabine/Abraxane. -Patient on clear liquids and diet being advanced to a full liquid diet.  -Resume home regimen Creon 2 tablets 3 times daily with meals.   -Outpatient follow-up with oncology.  4.  Diabetes mellitus type 2 with hyperglycemia/chronic kidney disease stage IIIa -Stable. -Hemoglobin A1c 7.2 (03/05/2021) -CBG 125 this morning.   -SSI.  5.  Chronic kidney disease stage IIIa -Stable.  6.  COVID-19 infection -Patient noted to have  tested + June 2022, currently asymptomatic. -Patient likely should NOT have had repeat testing as he was asymptomatic. -Isolation precautions have been discontinued.  7.  Severe protein calorie malnutrition -Patient with significant muscle mass and subcutaneous fat loss on examination. -Currently on clears which she is tolerating diet to be advanced to full liquids per GI.   -Once on a solid diet will liberalize to regular diet.   -Add Ensure supplementation.   -Consult with dietitian.    8.  Pressure ulcer, POA Pressure Injury 04/27/21 Coccyx Mid;Upper Stage 1 -  Intact skin with non-blanchable redness of a localized area usually over a bony prominence. (Active)  04/27/21 0402  Location: Coccyx  Location Orientation: Mid;Upper  Staging: Stage 1 -  Intact skin with non-blanchable redness of a localized area usually over a bony prominence.  Wound Description (Comments):   Present on Admission: Yes  Continue current wound care.  9.  Chronic diarrhea -Patient with chronic diarrhea states general surgeon increase Creon to 2 tablets with meals.   -Patient tolerating clears and will place back: Creon 2 tablets 3 times daily with meals.   -Outpatient follow-up.         DVT prophylaxis: SCDs Code Status: Full Family Communication: Updated patient and wife at bedside. Disposition:   Status is: Inpatient  Remains inpatient appropriate because:Inpatient level of care appropriate due to severity of illness  Dispo: The patient is from: Home              Anticipated d/c is to: Home              Patient currently is not medically stable to d/c.   Difficult to place patient No       Consultants:  Gastroenterology: Dr. [name] 04/27/2021  Procedures: Tagged RBC scan 04/28/2021-negative Chest x-ray 04/26/2021 Transfusion 2 units packed red blood cells 04/27/2021 Transfusion 1 unit packed red blood cells 04/28/2021 Upper endoscopy 04/29/2021 per Dr. [name]  Antimicrobials:   None   Subjective: Sitting up in chair.  Feeling a whole lot better.  States he used to have upper abdominal pain which seems to have disappeared/resolved now.  No chest pain.  No shortness of breath.  Denies any further bloody bowel movements.  Tolerating clear liquids.  Anxious to go home.   Objective: Vitals:   04/29/21 1550 04/29/21 1600 04/29/21 2115 04/30/21 0502  BP: 136/61 (!) 159/66 135/73 (!) 146/77  Pulse: 94 91 87 82  Resp: 20 (!) 22 20 19   Temp:   99.1 F (37.3 C) 98.6 F (37 C)  TempSrc:   Oral Oral  SpO2: 98% 99% 100% 100%  Weight:      Height:        Intake/Output Summary (Last 24 hours) at 04/30/2021 1053 Last data filed at 04/30/2021 0600 Gross per 24 hour  Intake 2349.01 ml  Output 350 ml  Net 1999.01 ml    Filed Weights   04/26/21 1508 04/29/21 1422  Weight: 52.6 kg 52.6 kg    Examination:  General exam: Cachectic.  Frail.  Respiratory system: CTA B.  No wheezes, no crackles, no rhonchi.  Normal respiratory effort.  Speaking in full sentences.   Cardiovascular system: RRR no murmurs rubs or gallops.  No JVD.  No lower extremity edema.  Gastrointestinal system: Abdomen is soft, nondistended, nontender, positive bowel sounds.  No rebound.  No guarding. Central nervous system: Alert and oriented.  No focal neurological deficits.  Extremities: Symmetric 5 x 5 power. Skin: No rashes, lesions or ulcers Psychiatry: Judgement and insight appear normal. Mood & affect appropriate.     Data Reviewed: I have personally reviewed following labs and imaging studies  CBC: Recent Labs  Lab 04/26/21 1331 04/26/21 1552 04/27/21 0936 04/28/21 0505 04/28/21 1332 04/29/21 0424 04/29/21 1555 04/29/21 2207 04/30/21 0336 04/30/21 0949  WBC 13.6* 12.3*   < > 9.1  --  8.7  --  12.8* 10.4 14.1*  NEUTROABS 11.2* 10.1*  --   --   --  6.6  --   --   --   --   HGB 5.9* 5.9*   < > 7.9*   < > 9.3* 10.0* 8.8* 8.5* 9.4*  HCT 17.8* 18.2*   < > 24.6*   < > 28.5* 30.6*  27.1* 26.6* 30.9*  MCV 90.4 91.5   < > 90.4  --  89.3  --  92.2 91.4 97.5  PLT 358 348   < > 205  --  190  --  182 186 170   < > = values in this interval not displayed.     Basic Metabolic Panel: Recent Labs  Lab 04/26/21 1552 04/27/21 0936 04/28/21 0505 04/29/21 0424 04/30/21 0336  NA 130* 129* 129* 132* 135  K 4.9 4.5 4.3 3.7 3.8  CL 100 100 99 101 106  CO2 20* 22 22 20* 18*  GLUCOSE 193* 159* 133* 132* 112*  BUN 30* 28* 23 22 21   CREATININE 1.27* 1.34* 1.29* 1.36* 1.46*  CALCIUM 8.8* 8.6* 8.7* 8.9 8.7*  MG  --  2.1 1.9 1.9  --   PHOS  --   --  3.4 3.9  --      GFR: Estimated Creatinine Clearance: 31.5 mL/min (A) (by C-G formula based on SCr of 1.46 mg/dL (H)).  Liver Function Tests: Recent Labs  Lab 04/26/21 1552 04/28/21 0505 04/29/21 0424  AST 17  --   --   ALT 26  --   --   ALKPHOS 194*  --   --   BILITOT 0.5  --   --   PROT 6.1*  --   --   ALBUMIN 3.1* 2.9* 3.1*     CBG: Recent Labs  Lab 04/28/21 2107 04/29/21 0757 04/29/21 1200 04/29/21 1821 04/30/21 0805  GLUCAP 159* 161* 113* 145* 125*      Recent Results (from the past 240 hour(s))  Resp Panel by RT-PCR (Flu A&B, Covid) Nasopharyngeal Swab     Status: Abnormal   Collection Time: 04/26/21  3:57 PM   Specimen: Nasopharyngeal Swab; Nasopharyngeal(NP) swabs in vial transport medium  Result Value Ref Range Status   SARS Coronavirus 2 by RT PCR POSITIVE (A) NEGATIVE Final    Comment: RESULT CALLED TO, READ BACK BY AND VERIFIED WITH: PATTY DOWD RN [id number] 04/26/21  (NOTE) SARS-CoV-2 target nucleic acids are DETECTED.  The SARS-CoV-2 RNA is generally detectable in upper respiratory specimens during the acute phase of infection. Positive results are indicative of the presence of the identified virus, but do not rule out bacterial infection or co-infection with other pathogens not detected by the test. Clinical correlation with patient history and other diagnostic information is necessary  to determine patient infection status. The expected result is Negative.  Fact  Sheet for Patients: EntrepreneurPulse.com.au  Fact Sheet for Healthcare Providers: IncredibleEmployment.be  This test is not yet approved or cleared by the [place] FDA and  has been authorized for detection and/or diagnosis of SARS-CoV-2 by FDA under an Emergency Use Authorization (EUA).  This EUA will remain in effect (meaning this test can be u sed) for the duration of  the COVID-19 declaration under Section 564(b)(1) of the Act, 21 U.S.C. section 360bbb-3(b)(1), unless the authorization is terminated or revoked sooner.     Influenza A by PCR NEGATIVE NEGATIVE Final   Influenza B by PCR NEGATIVE NEGATIVE Final    Comment: (NOTE) The Xpert Xpress SARS-CoV-2/FLU/RSV plus assay is intended as an aid in the diagnosis of influenza from Nasopharyngeal swab specimens and should not be used as a sole basis for treatment. Nasal washings and aspirates are unacceptable for Xpert Xpress SARS-CoV-2/FLU/RSV testing.  Fact Sheet for Patients: EntrepreneurPulse.com.au  Fact Sheet for Healthcare Providers: IncredibleEmployment.be  This test is not yet approved or cleared by the [place] FDA and has been authorized for detection and/or diagnosis of SARS-CoV-2 by FDA under an Emergency Use Authorization (EUA). This EUA will remain in effect (meaning this test can be used) for the duration of the COVID-19 declaration under Section 564(b)(1) of the Act, 21 U.S.C. section 360bbb-3(b)(1), unless the authorization is terminated or revoked.  Performed at [facility], [address] [place], [place] [address]           Radiology Studies: NM GI Blood Loss  Result Date: 04/28/2021 CLINICAL DATA:  GI bleed. Large volume of blood per rectum last night. Right lower quadrant abdominal pain. EXAM: NUCLEAR MEDICINE  GASTROINTESTINAL BLEEDING SCAN TECHNIQUE: Sequential abdominal images were obtained following intravenous administration of Tc-82mlabeled red blood cells. RADIOPHARMACEUTICALS:  20.8 mCi Tc-984mertechnetate in-vitro labeled red cells. COMPARISON:  Abdominopelvic CT 05/04/2021. FINDINGS: Imaging through 2 hours demonstrates no evidence of active gastrointestinal bleeding. There is normal blood pool and hepatic activity. Mildly prominent activity in the left upper quadrant does not move and likely is gastric. Some bladder activity is noted on the delayed images. IMPRESSION: No evidence of active gastrointestinal bleeding. Electronically Signed   By: WiRichardean Sale.D.   On: 04/28/2021 16:30        Scheduled Meds:  sodium chloride   Intravenous Once   insulin aspart  0-6 Units Subcutaneous TID WC   losartan  12.5 mg Oral Daily   [START ON 05/03/2021] pantoprazole  40 mg Intravenous Q12H   Continuous Infusions:  sodium chloride 100 mL/hr at 04/30/21 0544   pantoprazole 8 mg/hr (04/30/21 0543)     LOS: 4 days    Time spent: 40 minutes    DaIrine SealMD [facility]   To contact the attending provider between 7A-7P or the covering provider during after hours 7P-7A, please log into the web site [email or web address] and access using universal [place] password for that web site. If you do not have the password, please call the hospital operator.  04/30/2021, 10:53 AM

## 2021-04-30 NOTE — Progress Notes (Signed)
Chart reviewed together with Dr. Buford Dresser DOD. Manuel Olson is a 75 year old male with past medical history of CAD last PCI December 2022 with DES to distal RCA, history of severe aortic stenosis s/p TAVR in December 3143, chronic systolic heart failure with baseline EF 45 to 50%, history of pancreatic cancer s/p Whipple procedure, renal cell carcinoma s/p nephrectomy, hypertension, hyperlipidemia and DM2.  Patient had history of GI bleed in April 2021, aspirin was discontinued at the time.  Patient has been on Plavix monotherapy since.  Unfortunately, he presented back to the hospital again on 04/26/2021 with recurrent GI bleed.  Seen by GI service and underwent EGD that revealed active bleeding at gastric anastomotic ulcer, this was injected and clipped.  Patient is considered a very high risk candidate for recurrent GI bleed.  Since his last PCI and a TAVR was more than 1 year ago, he is safe to come off of Plavix therapy as risk of bleeding is very high.  Given his cardiac history, once felt his bleeding risk is low, may consider restart him on 81 mg aspirin rather than Plavix therapy in several weeks.  Case has been reviewed with Dr. Harrell Gave who was agreeable.   Please contact us if any further questions

## 2021-04-30 NOTE — Plan of Care (Signed)

## 2021-04-30 NOTE — Anesthesia Postprocedure Evaluation (Signed)
Anesthesia Post Note  Patient: Manuel Olson  Procedure(s) Performed: ESOPHAGOGASTRODUODENOSCOPY (EGD) WITH PROPOFOL [place]     Patient location during evaluation: PACU Anesthesia Type: MAC Level of consciousness: awake and alert and oriented Pain management: pain level controlled Vital Signs Assessment: post-procedure vital signs reviewed and stable Respiratory status: spontaneous breathing, nonlabored ventilation and respiratory function stable Cardiovascular status: blood pressure returned to baseline Postop Assessment: no apparent nausea or vomiting Anesthetic complications: no   No notable events documented.              Marthenia Rolling

## 2021-04-30 NOTE — Progress Notes (Signed)
Patient's 2200 blood sugar check taken and verified on glucometer as being 131. Data has not transferred to patient's chart at present.

## 2021-04-30 NOTE — Progress Notes (Addendum)
[facility] Progress Note  CC:    Anemia, upper GI bleed/melena     Subjective: He reports feeling well this morning. Tolerating a clear liquid diet for breakfast. No hematochezia or BM over night. No abdominal pain. No CP or SOB.   Objective:   S/P EGD 04/29/2021: -Actively bleeding gastric anastomotic ulcer ([place] Class Ia). Injected. Clips (MR conditional) were placed. -No specimens collected. -Patient at a very high risk for recurrent bleeding.  Vital signs in last 24 hours: Temp:  [97.8 F (36.6 C)-99.5 F (37.5 C)] 98.6 F (37 C) (08/19 0502) Pulse Rate:  [34-94] 82 (08/19 0502) Resp:  [15-22] 19 (08/19 0502) BP: (120-166)/(44-89) 146/77 (08/19 0502) SpO2:  [98 %-100 %] 100 % (08/19 0502) Weight:  [52.6 kg] 52.6 kg (08/18 1422) Last BM Date: 04/29/21  General: 75 year old cachectic appearing male in NAD.  Heart: RRR, soft systolic murmur.  Pulm:  Breath sounds clear throughout.  Abdomen: Flat, nontender. + BS x 4 quads. Aorta above umbilicus dilated and pulsatile (hx of aortic aneurysm).  Extremities:  Without edema. Neurologic:  Alert and  oriented x4. Grossly normal neurologically. Psych:  Alert and cooperative. Normal mood and affect.  Intake/Output from previous day: 08/18 0701 - 08/19 0700 In: 2349 [P.O.:480; I.V.:1869] Out: 350 [Urine:200; Blood:150] Intake/Output this shift: No intake/output data recorded.  Lab Results: Recent Labs    04/29/21 0424 04/29/21 1555 04/29/21 2207 04/30/21 0336  WBC 8.7  --  12.8* 10.4  HGB 9.3* 10.0* 8.8* 8.5*  HCT 28.5* 30.6* 27.1* 26.6*  PLT 190  --  182 186   BMET Recent Labs    04/28/21 0505 04/29/21 0424 04/30/21 0336  NA 129* 132* 135  K 4.3 3.7 3.8  CL 99 101 106  CO2 22 20* 18*  GLUCOSE 133* 132* 112*  BUN 23 22 21   CREATININE 1.29* 1.36* 1.46*  CALCIUM 8.7* 8.9 8.7*   LFT Recent Labs    04/29/21 0424  ALBUMIN 3.1*   PT/INR No results for input(s): LABPROT, INR in the  last 72 hours. Hepatitis Panel No results for input(s): HEPBSAG, HCVAB, HEPAIGM, HEPBIGM in the last 72 hours.  NM GI Blood Loss  Result Date: 04/28/2021 CLINICAL DATA:  GI bleed. Large volume of blood per rectum last night. Right lower quadrant abdominal pain. EXAM: NUCLEAR MEDICINE GASTROINTESTINAL BLEEDING SCAN TECHNIQUE: Sequential abdominal images were obtained following intravenous administration of Tc-74mlabeled red blood cells. RADIOPHARMACEUTICALS:  20.8 mCi Tc-972mertechnetate in-vitro labeled red cells. COMPARISON:  Abdominopelvic CT 05/04/2021. FINDINGS: Imaging through 2 hours demonstrates no evidence of active gastrointestinal bleeding. There is normal blood pool and hepatic activity. Mildly prominent activity in the left upper quadrant does not move and likely is gastric. Some bladder activity is noted on the delayed images. IMPRESSION: No evidence of active gastrointestinal bleeding. Electronically Signed   By: WiRichardean Sale.D.   On: 04/28/2021 16:30    Assessment / Plan:  [id number] old male admitted to the hospital 04/26/2021 secondary upper GI bleed/melena on Plavix with anemia, dizziness and generalized weakness. Prior history of GIB secondary to an anastomotic GJ ulcer per EGD 4/20121. Admission Hg 5.9. Transfused 2 units of PRBCs.  Post transfusion Hg 8.4 -> 8.2. He passed several maroon colored bloody BMs on 8/17 transfused 1 unit or PRBCs -> Hg 10.3. Tagged RBC scan  8/17 was negative. S/P EGD 8/18 identified Whipple's anatomy, one linear  8-1039mastric ulcer with spurting hemorrhage, this area  was injected with epi and 6 hemostatic clips were place with successful hemostasis. Post EGD Hg 10 -> 10.2 -> 8.8 -> Today Hg 8.5. No overt GI bleeding overnight. Hemodynamically stable.  -Continue to monitor H/H closely  -Monitor patient for active GI bleeding -Continue Pantoprazole 55m/hr infusion  -He is at high risk for recurrent bleeding, if he re-bleeds he will require IR  evaluation with angio/embolization -Continue Pantoprazole infusion 835mlhr -Continue IV fluids -Clear liquid diet this am, if no active GI bleeding advance to full liquid diet with Boost or Ensure -Continue to hold Plavix, will need to consult with cardiology regarding stopping Plavix as patient is at extremely high risk for rebleeding -Await further recommendations per Dr. GuLyndel Safe 2) CAD s/p MI x 2 with multiple stents on Plavix. Severe AS s/p TAVR 2018.  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 No Covid sx. Isolation discontinued.   6) CKD. Cr 1.34 -> 1.29 -> 1.46 -IVFs per the hospitalist      Principal Problem:   GI bleed Active Problems:   [place]   Severe AS S/P TAVR    Essential hypertension, benign   CAD (coronary artery disease)   Insulin dependent type 2 diabetes mellitus (HCC)   Acute GI bleeding   COVID-19   Symptomatic anemia     LOS: 4 days   CoNoralyn Pick8/19/2022, 08:10 AM    Attending physician's note   I have taken an interval history, reviewed the chart and examined the patient. I agree with the Advanced Practitioner's note, impression and recommendations.   UGI bleed d/t actively bleeding anastomotic ulcer s/p epi/Clips 8/18.  No further bleeding. Hb stable. CAD s/p MI with multiple stents on plavix (no stents over past 1 year) CKD with 1 kidney (H/O RCC s/p L nephrectomy 02/2018)  Plan: -Continue IV Protonix, then transition to 40 BID (see EGD note) -Trend CBC -Advance to full liquid diet.  If no further bleeding, soft heart healthy diet in AM. -If recurrent bleeding, IR  embolization. -Plavix on hold (last dose 8/15). ?need for plavix or switch to ECASA - Pl run it by cardiology. Can resume ASA/plavix in 1 week from GI standpoint. -FU GI clinic in 2 weeks. -Will be available for any ? -D/W Dr ThGrandville SilosD/W pt and pt's wife in detail.   RaCarmell AustriaMD LeVelora HecklerI [phone]

## 2021-05-01 LAB — CBC
HCT: 24 % — ABNORMAL LOW (ref 39.0–52.0)
HCT: 26.9 % — ABNORMAL LOW (ref 39.0–52.0)
Hemoglobin: 8 g/dL — ABNORMAL LOW (ref 13.0–17.0)
Hemoglobin: 8.8 g/dL — ABNORMAL LOW (ref 13.0–17.0)
MCH: 30.5 pg (ref 26.0–34.0)
MCH: 29.8 pg (ref 26.0–34.0)
MCHC: 33.3 g/dL (ref 30.0–36.0)
MCHC: 32.7 g/dL (ref 30.0–36.0)
MCV: 91.6 fL (ref 80.0–100.0)
MCV: 91.2 fL (ref 80.0–100.0)
Platelets: 136 10*3/uL — ABNORMAL LOW (ref 150–400)
Platelets: 139 10*3/uL — ABNORMAL LOW (ref 150–400)
RBC: 2.62 MIL/uL — ABNORMAL LOW (ref 4.22–5.81)
RBC: 2.95 MIL/uL — ABNORMAL LOW (ref 4.22–5.81)
RDW: 16.8 % — ABNORMAL HIGH (ref 11.5–15.5)
RDW: 17 % — ABNORMAL HIGH (ref 11.5–15.5)
WBC: 7.8 10*3/uL (ref 4.0–10.5)
WBC: 9.5 10*3/uL (ref 4.0–10.5)
nRBC: 0 % (ref 0.0–0.2)
nRBC: 0 % (ref 0.0–0.2)

## 2021-05-01 LAB — BASIC METABOLIC PANEL
Anion gap: 5 (ref 5–15)
BUN: 13 mg/dL (ref 8–23)
CO2: 21 mmol/L — ABNORMAL LOW (ref 22–32)
Calcium: 8.3 mg/dL — ABNORMAL LOW (ref 8.9–10.3)
Chloride: 105 mmol/L (ref 98–111)
Creatinine, Ser: 1.17 mg/dL (ref 0.61–1.24)
GFR, Estimated: >60 mL/min (ref >60)
Glucose, Bld: 115 mg/dL — ABNORMAL HIGH (ref 70–99)
Potassium: 3.6 mmol/L (ref 3.5–5.1)
Sodium: 131 mmol/L — ABNORMAL LOW (ref 135–145)

## 2021-05-01 LAB — GLUCOSE, CAPILLARY
Glucose-Capillary: 172 mg/dL — ABNORMAL HIGH (ref 70–99)
Glucose-Capillary: 241 mg/dL — ABNORMAL HIGH (ref 70–99)
Glucose-Capillary: 198 mg/dL — ABNORMAL HIGH (ref 70–99)

## 2021-05-01 MED ORDER — TRAZODONE HCL 50 MG PO TABS
50.0000 mg | ORAL_TABLET | Freq: Once | ORAL | Status: AC
  Administered 2021-05-01: 50 mg via ORAL
  Filled 2021-05-01: qty 1

## 2021-05-01 MED ORDER — POTASSIUM CHLORIDE CRYS ER 20 MEQ PO TBCR
20.0000 meq | EXTENDED_RELEASE_TABLET | Freq: Once | ORAL | Status: AC
  Administered 2021-05-01: 20 meq via ORAL
  Filled 2021-05-01: qty 1

## 2021-05-01 NOTE — Plan of Care (Signed)
  Problem: Education: Goal: Knowledge of General Education information will improve Description: Including pain rating scale, medication(s)/side effects and non-pharmacologic comfort measures 05/01/2021 1616 by Zadie Rhine, RN Outcome: Progressing 05/01/2021 1521 by Zadie Rhine, RN Outcome: Progressing   Problem: Health Behavior/Discharge Planning: Goal: Ability to manage health-related needs will improve Outcome: Progressing

## 2021-05-01 NOTE — Plan of Care (Signed)
  Problem: Education: Goal: Knowledge of General Education information will improve Description Including pain rating scale, medication(s)/side effects and non-pharmacologic comfort measures Outcome: Progressing   

## 2021-05-01 NOTE — Progress Notes (Signed)
Pt up to the bathroom to void, one stool for shift, brown soft stool stool, NO frank red or dark stool noted. Will continue to monitor and up MD as needed. SRP, RN

## 2021-05-01 NOTE — Progress Notes (Signed)
Results for Manuel Olson, Manuel Olson (MRN [id number]) as of 05/01/2021 16:09  Ref. Range 05/01/2021 15:50  WBC Latest Ref Range: 4.0 - 10.5 K/uL 9.5  RBC Latest Ref Range: 4.22 - 5.81 MIL/uL 2.95 (L)  Hemoglobin Latest Ref Range: 13.0 - 17.0 g/dL 8.8 (L)  HCT Latest Ref Range: 39.0 - 52.0 % 26.9 (L)  MCV Latest Ref Range: 80.0 - 100.0 fL 91.2  MCH Latest Ref Range: 26.0 - 34.0 pg 29.8  MCHC Latest Ref Range: 30.0 - 36.0 g/dL 32.7  RDW Latest Ref Range: 11.5 - 15.5 % 17.0 (H)  Platelets Latest Ref Range: 150 - 400 K/uL 139 (L)  nRBC Latest Ref Range: 0.0 - 0.2 % 0.0

## 2021-05-01 NOTE — Progress Notes (Signed)
PROGRESS NOTE    Manuel Olson  TDS:[id number] DOB: April 22, 1946 DOA: 04/26/2021 PCP: [name], MD   Chief Complaint  Patient presents with   Weakness    From cancer center with low HGB level    Brief Narrative:  75 year old M with PMH of pancreatic cancer s/p Whipple, RCC s/p right nephrectomy, DM-2, CAD s/p DES in 2018 and 2020, TAVR and CKD-3 presenting with generalized weakness, melena, shortness of breath and dizziness, and admitted for ABLA with Hgb of 5.9 in the setting of possible upper GI bleed.  Hemoccult positive.  Incidentally tested positive for COVID-19.  Not hypoxic.  CXR without acute finding.  Transfused 2 units with appropriate response.  GI consulted, and planning to scope.  Plavix on hold.  Last dose the morning of 8/15. -Patient with ongoing bleeding, underwent tagged RBC scan 04/28/2021 which was negative. -Patient for colonoscopy/EGD 04/29/2021.     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  Etiology. -Patient with history of prior GI bleed secondary to anastomotic GJ ulcer per EGD 12/2012. -Patient noted on presentation to have a hemoglobin of 5.9 status posttransfusion of total of 3 units packed red blood cells during this hospitalization with hemoglobin currently at 8.0 this morning..  -Patient denies any further bloody bowel movements this morning. -States improvement with upper abdominal pain and currently denies any ongoing upper abdominal pain.  -Patient underwent tagged RBC scan 04/28/2021 which was negative for any active bleeding. -Patient underwent EGD on 04/29/2021 which showed Whipple's anatomy,  1 linear 8 to 10 mm gastric ulcer with spurting hemorrhage status post epi injection and 6 hemostatic clips with successful hemostasis.   -Serial CBCs stable with hemoglobin at 8.0.  Repeat H&H pending for this afternoon..   -Per GI patient at high risk for recurrent bleeding and if patient rebleeds will require IR evaluation with angio/embolization.   -Continue Protonix drip for total of 72 hours and subsequently transition to IV PPI twice daily versus oral PPI twice daily.  -Plavix on hold and will not be resumed on discharge due to patient's increased risk of rebleeding. -Tolerating full liquid diet and will advance to soft diet as patient with no further bleeding. -Repeat H&H this afternoon. -Supportive care.  2 coronary artery disease status post MI x2 with multiple stents on Plavix/severe AS status post TAVR 2018 -Plavix on hold secondary to problem #1 and likely will continue to hold and not resume until cleared by GI on follow-up in the outpatient setting. -Continue Cozaar.   -Continue to hold Lipitor and resume on discharge.  -Due to high risk of rebleed cardiology curb sided and per cardiology progress note since patient's last PCI and TAVR was greater than a year ago it was safe to come off Plavix therapy as risk of bleeding was very high.  Per cardiology may consider restarting patient on 81 mg of aspirin rather than Plavix therapy in several weeks once bleeding risk is felt to be low.   -Outpatient follow-up with cardiology.    3.  History of pancreatic adenocarcinoma status post Whipple surgery 02/2018 -Status posttreatment  with FOLFOX/FOLFIRINOX with adjunct gemcitabine/Abraxane. -Patient tolerating full liquid diet.   -Advance to soft diet.   -Continue home regimen Creon 2 tablets 3 times daily with meals.   -Outpatient follow-up with oncology.    4.  Diabetes mellitus type 2 with hyperglycemia/chronic kidney disease stage IIIa -Stable. -Hemoglobin A1c 7.2 (03/05/2021) -CBG  172 this morning.   -SSI.  5.  Acute kidney injury on chronic kidney disease stage IIIa -Likely secondary to prerenal azotemia.   -Improved with hydration.   -Saline lock IV fluids.    6.  COVID-19 infection -Patient noted to have tested + June 2022, currently asymptomatic. -Patient likely should NOT have had repeat testing as he was asymptomatic. -Isolation precautions have been discontinued.  7.  Severe protein calorie malnutrition -Patient with significant muscle mass and subcutaneous fat loss on examination. -On full liquid diet and diet to be advanced to soft diet this morning.  -Continue Ensure supplementation.  -Dietitian consulted.  8.  Pressure ulcer, POA Pressure Injury 04/27/21 Coccyx Mid;Upper Stage 1 -  Intact skin with non-blanchable redness of a localized area usually over a bony prominence. (Active)  04/27/21 0402  Location: Coccyx  Location Orientation: Mid;Upper  Staging: Stage 1 -  Intact skin with non-blanchable redness of a localized area usually over a bony prominence.  Wound Description (Comments):   Present on Admission: Yes  Continue current wound care.  9.  Chronic diarrhea -Patient with chronic diarrhea states general surgeon increased Creon to 2 tablets with meals.   -Patient tolerating full liquid diet and does not want to start Creon until he is on a solid diet.   -Outpatient follow-up.        DVT prophylaxis: SCDs Code Status: Full Family Communication: Updated patient and wife at bedside. Disposition:   Status is: Inpatient  Remains inpatient appropriate because:Inpatient level of care appropriate due to severity of illness  Dispo: The patient is from: Home              Anticipated d/c is to: Home              Patient currently is not medically stable to d/c.   Difficult to place patient No       Consultants:  Gastroenterology: Dr. [name] 04/27/2021 Curb sided cardiology.  Procedures: Tagged RBC scan 04/28/2021-negative Chest  x-ray 04/26/2021 Transfusion 2 units packed red blood cells 04/27/2021 Transfusion 1 unit packed red blood cells 04/28/2021 Upper endoscopy 04/29/2021 per Dr. [name]  Antimicrobials:  None   Subjective: Sitting up in bed.  No chest pain.  No shortness of breath.  No abdominal pain.  Denies any bloody bowel movements.  Tolerating full liquid diet.  Wife at bedside.    Objective: Vitals:   04/30/21 0502 04/30/21 1138 04/30/21 2100 05/01/21 0400  BP: (!) 146/77 (!) 160/80 (!) 151/72 129/67  Pulse: 82 77 70 76  Resp: 19 16 20 15   Temp: 98.6 F (37 C) 98.4 F (36.9 C) 99.1 F (37.3 C) 98.5 F (36.9 C)  TempSrc: Oral Oral Oral Oral  SpO2: 100% 98% 100% 98%  Weight:      Height:        Intake/Output Summary (Last 24 hours) at 05/01/2021 1118 Last data filed at 05/01/2021 0759 Gross per 24 hour  Intake 3323.43 ml  Output 400 ml  Net 2923.43 ml    Filed Weights   04/26/21 1508 04/29/21 1422  Weight: 52.6 kg 52.6 kg    Examination:  General exam: Frail.  Cachectic. Respiratory system: Lungs clear to auscultation bilaterally.  No wheezes, no crackles, no rhonchi.  Normal respiratory effort.  Speaking in full sentences.     Cardiovascular system: Regular rate rhythm no murmurs rubs or gallops.  No JVD.  No lower extremity edema. Gastrointestinal system: Abdomen is soft, nontender, nondistended, positive bowel sounds.  No rebound.  No guarding. Central nervous system: Alert and oriented.  No focal neurological deficits.  Extremities: Symmetric 5 x 5 power. Skin: No rashes, lesions or ulcers Psychiatry: Judgement and insight appear normal. Mood & affect appropriate.     Data Reviewed: I have personally reviewed following labs and imaging studies  CBC: Recent Labs  Lab 04/26/21 1331 04/26/21 1552 04/27/21 0936 04/29/21 0424 04/29/21 1555 04/29/21 2207 04/30/21 0336 04/30/21 0949 04/30/21 1606 05/01/21 0335  WBC 13.6* 12.3*   < > 8.7  --  12.8* 10.4 14.1* 10.8* 7.8   NEUTROABS 11.2* 10.1*  --  6.6  --   --   --   --   --   --   HGB 5.9* 5.9*   < > 9.3*   < > 8.8* 8.5* 9.4* 8.6* 8.0*  HCT 17.8* 18.2*   < > 28.5*   < > 27.1* 26.6* 30.9* 29.4* 24.0*  MCV 90.4 91.5   < > 89.3  --  92.2 91.4 97.5 102.1* 91.6  PLT 358 348   < > 190  --  182 186 170 183 136*   < > = values in this interval not displayed.     Basic Metabolic Panel: Recent Labs  Lab 04/27/21 0936 04/28/21 0505 04/29/21 0424 04/30/21 0336 05/01/21 0335  NA 129* 129* 132* 135 131*  K 4.5 4.3 3.7 3.8 3.6  CL 100 99 101 106 105  CO2 22 22 20* 18* 21*  GLUCOSE 159* 133* 132* 112* 115*  BUN 28* 23 22 21 13   CREATININE 1.34* 1.29* 1.36* 1.46* 1.17  CALCIUM 8.6* 8.7* 8.9 8.7* 8.3*  MG 2.1 1.9 1.9  --   --   PHOS  --  3.4 3.9  --   --      GFR: Estimated Creatinine Clearance: 39.3 mL/min (by C-G formula based on SCr of 1.17 mg/dL).  Liver Function Tests: Recent Labs  Lab 04/26/21 1552 04/28/21 0505 04/29/21 0424  AST 17  --   --   ALT 26  --   --   ALKPHOS 194*  --   --   BILITOT 0.5  --   --   PROT 6.1*  --   --   ALBUMIN 3.1* 2.9* 3.1*     CBG: Recent Labs  Lab 04/30/21 0805 04/30/21 1135 04/30/21 1704 04/30/21 2116 05/01/21 0754  GLUCAP 125* 185* 211* 123* 172*      Recent Results (from the past 240 hour(s))  Resp Panel by RT-PCR (Flu A&B, Covid) Nasopharyngeal Swab     Status: Abnormal   Collection Time: 04/26/21  3:57 PM   Specimen: Nasopharyngeal Swab; Nasopharyngeal(NP) swabs in vial transport medium  Result Value Ref Range Status   SARS Coronavirus 2 by RT PCR POSITIVE (A) NEGATIVE Final    Comment: RESULT CALLED TO, READ BACK BY AND VERIFIED WITH: PATTY DOWD RN [id number] [facility] 04/26/21  (NOTE) SARS-CoV-2 target nucleic acids are DETECTED.  The SARS-CoV-2 RNA is generally detectable in upper respiratory specimens during the acute phase of infection. Positive results are indicative of the presence of the identified virus, but do not rule out bacterial  infection  or co-infection with other pathogens not detected by the test. Clinical correlation with patient history and other diagnostic information is necessary to determine patient infection status. The expected result is Negative.  Fact Sheet for Patients: EntrepreneurPulse.com.au  Fact Sheet for Healthcare Providers: IncredibleEmployment.be  This test is not yet approved or cleared by the [place] FDA and  has been authorized for detection and/or diagnosis of SARS-CoV-2 by FDA under an Emergency Use Authorization (EUA).  This EUA will remain in effect (meaning this test can be u sed) for the duration of  the COVID-19 declaration under Section 564(b)(1) of the Act, 21 U.S.C. section 360bbb-3(b)(1), unless the authorization is terminated or revoked sooner.     Influenza A by PCR NEGATIVE NEGATIVE Final   Influenza B by PCR NEGATIVE NEGATIVE Final    Comment: (NOTE) The Xpert Xpress SARS-CoV-2/FLU/RSV plus assay is intended as an aid in the diagnosis of influenza from Nasopharyngeal swab specimens and should not be used as a sole basis for treatment. Nasal washings and aspirates are unacceptable for Xpert Xpress SARS-CoV-2/FLU/RSV testing.  Fact Sheet for Patients: EntrepreneurPulse.com.au  Fact Sheet for Healthcare Providers: IncredibleEmployment.be  This test is not yet approved or cleared by the [place] FDA and has been authorized for detection and/or diagnosis of SARS-CoV-2 by FDA under an Emergency Use Authorization (EUA). This EUA will remain in effect (meaning this test can be used) for the duration of the COVID-19 declaration under Section 564(b)(1) of the Act, 21 U.S.C. section 360bbb-3(b)(1), unless the authorization is terminated or revoked.  Performed at [facility], [address] [place], [place] [address]           Radiology Studies: No results  found.      Scheduled Meds:  sodium chloride   Intravenous Once   feeding supplement  237 mL Oral BID BM   insulin aspart  0-6 Units Subcutaneous TID WC   lipase/protease/amylase  72,000 Units Oral TID WC   losartan  12.5 mg Oral Daily   [START ON 05/03/2021] pantoprazole  40 mg Intravenous Q12H   Continuous Infusions:  pantoprazole 8 mg/hr (05/01/21 0125)     LOS: 5 days    Time spent: 40 minutes    Irine Seal, MD [facility]   To contact the attending provider between 7A-7P or the covering provider during after hours 7P-7A, please log into the web site [email or web address] and access using universal [place] password for that web site. If you do not have the password, please call the hospital operator.  05/01/2021, 11:18 AM

## 2021-05-02 DIAGNOSIS — E871 Hypo-osmolality and hyponatremia: Secondary | ICD-10-CM

## 2021-05-02 LAB — BASIC METABOLIC PANEL
Anion gap: 5 (ref 5–15)
Anion gap: 8 (ref 5–15)
BUN: 11 mg/dL (ref 8–23)
BUN: 11 mg/dL (ref 8–23)
CO2: 21 mmol/L — ABNORMAL LOW (ref 22–32)
CO2: 20 mmol/L — ABNORMAL LOW (ref 22–32)
Calcium: 8.3 mg/dL — ABNORMAL LOW (ref 8.9–10.3)
Calcium: 8.2 mg/dL — ABNORMAL LOW (ref 8.9–10.3)
Chloride: 100 mmol/L (ref 98–111)
Chloride: 102 mmol/L (ref 98–111)
Creatinine, Ser: 1.14 mg/dL (ref 0.61–1.24)
Creatinine, Ser: 1.36 mg/dL — ABNORMAL HIGH (ref 0.61–1.24)
GFR, Estimated: >60 mL/min (ref >60)
GFR, Estimated: 54 mL/min — ABNORMAL LOW (ref >60)
Glucose, Bld: 153 mg/dL — ABNORMAL HIGH (ref 70–99)
Glucose, Bld: 243 mg/dL — ABNORMAL HIGH (ref 70–99)
Potassium: 3.8 mmol/L (ref 3.5–5.1)
Potassium: 4.2 mmol/L (ref 3.5–5.1)
Sodium: 126 mmol/L — ABNORMAL LOW (ref 135–145)
Sodium: 130 mmol/L — ABNORMAL LOW (ref 135–145)

## 2021-05-02 LAB — CBC
HCT: 26.9 % — ABNORMAL LOW (ref 39.0–52.0)
HCT: 27.3 % — ABNORMAL LOW (ref 39.0–52.0)
Hemoglobin: 8.7 g/dL — ABNORMAL LOW (ref 13.0–17.0)
Hemoglobin: 8.9 g/dL — ABNORMAL LOW (ref 13.0–17.0)
MCH: 29.9 pg (ref 26.0–34.0)
MCH: 30.4 pg (ref 26.0–34.0)
MCHC: 32.3 g/dL (ref 30.0–36.0)
MCHC: 32.6 g/dL (ref 30.0–36.0)
MCV: 92.4 fL (ref 80.0–100.0)
MCV: 93.2 fL (ref 80.0–100.0)
Platelets: 134 10*3/uL — ABNORMAL LOW (ref 150–400)
Platelets: 194 10*3/uL (ref 150–400)
RBC: 2.91 MIL/uL — ABNORMAL LOW (ref 4.22–5.81)
RBC: 2.93 MIL/uL — ABNORMAL LOW (ref 4.22–5.81)
RDW: 16.9 % — ABNORMAL HIGH (ref 11.5–15.5)
RDW: 17.1 % — ABNORMAL HIGH (ref 11.5–15.5)
WBC: 7.6 10*3/uL (ref 4.0–10.5)
WBC: 9.9 10*3/uL (ref 4.0–10.5)
nRBC: 0 % (ref 0.0–0.2)
nRBC: 0 % (ref 0.0–0.2)

## 2021-05-02 LAB — GLUCOSE, CAPILLARY
Glucose-Capillary: 134 mg/dL — ABNORMAL HIGH (ref 70–99)
Glucose-Capillary: 132 mg/dL — ABNORMAL HIGH (ref 70–99)

## 2021-05-02 LAB — OSMOLALITY: Osmolality: 270 mOsm/kg — ABNORMAL LOW (ref 275–295)

## 2021-05-02 MED ORDER — PANTOPRAZOLE INFUSION (NEW) - SIMPLE MED
8.0000 mg/h | INTRAVENOUS | Status: DC
  Administered 2021-05-02 – 2021-05-03 (×2): 8 mg/h via INTRAVENOUS
  Filled 2021-05-02: qty 100
  Filled 2021-05-02: qty 80
  Filled 2021-05-02: qty 100

## 2021-05-02 MED ORDER — ADULT MULTIVITAMIN W/MINERALS CH
1.0000 | ORAL_TABLET | Freq: Every day | ORAL | Status: DC
  Administered 2021-05-02 – 2021-05-03 (×2): 1 via ORAL
  Filled 2021-05-02 (×2): qty 1

## 2021-05-02 MED ORDER — FUROSEMIDE 10 MG/ML IJ SOLN
20.0000 mg | Freq: Once | INTRAMUSCULAR | Status: AC
  Administered 2021-05-02: 20 mg via INTRAVENOUS
  Filled 2021-05-02: qty 2

## 2021-05-02 NOTE — Progress Notes (Signed)
PROGRESS NOTE    GEE HABIG  YWV:[id number] DOB: [birth year] DOA: 04/26/2021 PCP: [name], MD   Chief Complaint  Patient presents with   Weakness    From cancer center with low HGB level    Brief Narrative:  75 year old M with PMH of pancreatic cancer s/p Whipple, RCC s/p right nephrectomy, DM-2, CAD s/p DES in 2018 and 2020, TAVR and CKD-3 presenting with generalized weakness, melena, shortness of breath and dizziness, and admitted for ABLA with Hgb of 5.9 in the setting of possible upper GI bleed.  Hemoccult positive.  Incidentally tested positive for COVID-19.  Not hypoxic.  CXR without acute finding.  Transfused 2 units with appropriate response.  GI consulted, and planning to scope.  Plavix on hold.  Last dose the morning of 8/15. -Patient with ongoing bleeding, underwent tagged RBC scan 04/28/2021 which was negative. -Patient for colonoscopy/EGD 04/29/2021.     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  EGD 04/29/2021 -Patient with history of prior GI bleed secondary to anastomotic GJ ulcer per EGD 12/2012. -Patient noted on presentation to have a hemoglobin of 5.9 status posttransfusion of total of 3 units packed red blood cells during this hospitalization with hemoglobin currently at 8.7 this morning..  -Patient denies any further bloody bowel movements this morning. -States improvement with upper abdominal pain and currently denies any ongoing upper abdominal pain.   -Patient underwent tagged RBC scan 04/28/2021 which was negative for any active bleeding. -Patient underwent EGD on 04/29/2021 which showed Whipple's anatomy, 1 linear 8 to 10 mm gastric ulcer with spurting hemorrhage status post epi injection and 6 hemostatic clips with successful hemostasis.   -Repeat H&H pending for this afternoon..   -Per GI patient at high risk for recurrent bleeding and if patient rebleeds will require IR evaluation with angio/embolization.   -Continue Protonix drip for total of 72 hours and subsequently transition to IV PPI twice daily versus oral PPI twice daily hopefully tomorrow if no further bleeding.  -Plavix held and will not be resumed on discharge due to patient's increased risk of rebleeding. -Diet has been advanced and patient tolerating soft diet.  -Supportive care.  2 coronary artery disease status post MI x2 with multiple stents on Plavix/severe AS status post TAVR 2018 -Plavix held secondary to problem #1 and will not resume on discharge. -Continue Cozaar.   -Continue to hold Lipitor and resume on discharge.  -Due to high risk of rebleed cardiology curb sided and per cardiology progress note since patient's last PCI and TAVR was greater than a year ago it was safe to come off Plavix therapy as risk of bleeding was very high.  Per cardiology may consider restarting patient on 81 mg of aspirin rather than Plavix therapy in several weeks once bleeding risk is felt to be low.   -Outpatient follow-up with cardiology.    3.  History of pancreatic adenocarcinoma status post Whipple surgery 02/2018 -Status posttreatment with FOLFOX/FOLFIRINOX with adjunct  gemcitabine/Abraxane. -Patient tolerating soft diet. -Continue home regimen Creon 2 tablets 3 times daily with meals. -Outpatient follow-up with oncology and general surgery.  4.  Diabetes mellitus type 2 with hyperglycemia/chronic kidney disease stage IIIa -Stable. -Hemoglobin A1c 7.2 (03/05/2021) -CBG 134 this  morning.   -SSI.  5.  Acute kidney injury on chronic kidney disease stage IIIa -Likely secondary to prerenal azotemia.   -Improved with hydration.  -IV fluids have been saline locked.  6.  COVID-19 infection -Patient noted to have tested + June 2022, currently asymptomatic. -Patient likely should NOT have had repeat testing as he was asymptomatic. -Isolation precautions have been discontinued.  7.  Severe protein calorie malnutrition -Patient with significant muscle mass and subcutaneous fat loss on examination. -Diet advanced to a soft diet which he is tolerating.   -Ensure supplementation.   -Dietitian consulted.   8.  Pressure ulcer, POA Pressure Injury 04/27/21 Coccyx Mid;Upper Stage 1 -  Intact skin with non-blanchable redness of a localized area usually over a bony prominence. (Active)  04/27/21 0402  Location: Coccyx  Location Orientation: Mid;Upper  Staging: Stage 1 -  Intact skin with non-blanchable redness of a localized area usually over a bony prominence.  Wound Description (Comments):   Present on Admission: Yes  Continue current wound care.  9.  Chronic diarrhea -Patient with chronic diarrhea states general surgeon increased Creon to 2 tablets with meals.   -Patient tolerating soft diet and Creon resumed.   -States stool was more formed today.   -Outpatient follow-up.  10.  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 -Sodium level this morning at 126 from 131. -Likely a component of hypovolemia as patient was on IV fluids, received transfusion of 3 units packed red blood cells during this hospitalization. -Patient noted to be positive approximately 6.65 L during this hospitalization. -Saline lock IV fluids. -Check urine sodium, urine creatinine, urine osmolality, serum osmolality. -Lasix 20 mg IV x1. -Daily weights. -Repeat labs this afternoon and in  the a.m.       DVT prophylaxis: SCDs Code Status: Full Family Communication: Updated patient and wife at bedside. Disposition:   Status is: Inpatient  Remains inpatient appropriate because:Inpatient level of care appropriate due to severity of illness  Dispo: The patient is from: Home              Anticipated d/c is to: Home              Patient currently is not medically stable to d/c.   Difficult to place patient No       Consultants:  Gastroenterology: Dr. [name] 04/27/2021 Curb sided cardiology.  Procedures: Tagged RBC scan 04/28/2021-negative Chest x-ray 04/26/2021 Transfusion 2 units packed red blood cells 04/27/2021 Transfusion 1 unit packed red blood cells 04/28/2021 Upper endoscopy 04/29/2021 per Dr. [name]  Antimicrobials:  None   Subjective: Sitting up in chair.  No chest pain, no shortness of breath, no abdominal pain.  No further bloody bowel movements.  Stated had a bowel movement that was more formed.  Anxious to go home.  Wife at bedside.    Objective: Vitals:   05/01/21 0400 05/01/21 1208 05/01/21 1950 05/02/21 0405  BP: 129/67 (!) 158/88 (!) 184/87 116/65  Pulse: 76 75 77 76  Resp: 15 17 19 16   Temp: 98.5 F (36.9 C) 97.7 F (36.5 C) 98.1 F (36.7 C) (!) 97.5 F (36.4 C)  TempSrc:  Oral Oral Oral Oral  SpO2: 98% 99% 99% 98%  Weight:      Height:        Intake/Output Summary (Last 24 hours) at 05/02/2021 1125 Last data filed at 05/02/2021 0400 Gross per 24 hour  Intake 333.47 ml  Output 550 ml  Net -216.53 ml    Filed Weights   04/26/21 1508 04/29/21 1422  Weight: 52.6 kg 52.6 kg    Examination:  General exam: Frail.  Cachectic. Respiratory system: Lungs clear to auscultation bilaterally.  No wheezes, no crackles, no rhonchi.  Normal respiratory effort.  Speaking in full sentences.     Cardiovascular system: Regular rate rhythm no murmurs rubs or gallops.  No JVD.  No lower extremity edema. Gastrointestinal system: Abdomen is soft,  nontender, nondistended, positive bowel sounds.  No rebound.  No guarding. Central nervous system: Alert and oriented.  No focal neurological deficits.  Extremities: Symmetric 5 x 5 power. Skin: No rashes, lesions or ulcers Psychiatry: Judgement and insight appear normal. Mood & affect appropriate.     Data Reviewed: I have personally reviewed following labs and imaging studies  CBC: Recent Labs  Lab 04/26/21 1331 04/26/21 1552 04/27/21 0936 04/29/21 0424 04/29/21 1555 04/30/21 0949 04/30/21 1606 05/01/21 0335 05/01/21 1550 05/02/21 0403  WBC 13.6* 12.3*   < > 8.7   < > 14.1* 10.8* 7.8 9.5 7.6  NEUTROABS 11.2* 10.1*  --  6.6  --   --   --   --   --   --   HGB 5.9* 5.9*   < > 9.3*   < > 9.4* 8.6* 8.0* 8.8* 8.7*  HCT 17.8* 18.2*   < > 28.5*   < > 30.9* 29.4* 24.0* 26.9* 26.9*  MCV 90.4 91.5   < > 89.3   < > 97.5 102.1* 91.6 91.2 92.4  PLT 358 348   < > 190   < > 170 183 136* 139* 134*   < > = values in this interval not displayed.     Basic Metabolic Panel: Recent Labs  Lab 04/27/21 0936 04/28/21 0505 04/29/21 0424 04/30/21 0336 05/01/21 0335 05/02/21 0403  NA 129* 129* 132* 135 131* 126*  K 4.5 4.3 3.7 3.8 3.6 3.8  CL 100 99 101 106 105 100  CO2 22 22 20* 18* 21* 21*  GLUCOSE 159* 133* 132* 112* 115* 153*  BUN 28* 23 22 21 13 11   CREATININE 1.34* 1.29* 1.36* 1.46* 1.17 1.14  CALCIUM 8.6* 8.7* 8.9 8.7* 8.3* 8.3*  MG 2.1 1.9 1.9  --   --   --   PHOS  --  3.4 3.9  --   --   --      GFR: Estimated Creatinine Clearance: 40.4 mL/min (by C-G formula based on SCr of 1.14 mg/dL).  Liver Function Tests: Recent Labs  Lab 04/26/21 1552 04/28/21 0505 04/29/21 0424  AST 17  --   --   ALT 26  --   --   ALKPHOS 194*  --   --   BILITOT 0.5  --   --   PROT 6.1*  --   --   ALBUMIN 3.1* 2.9* 3.1*     CBG: Recent Labs  Lab 04/30/21 2116 05/01/21 0754 05/01/21 1126 05/01/21 1628 05/02/21 0753  GLUCAP 123* 172* 241* 198* 134*      Recent Results (from the  past 240 hour(s))  Resp Panel by RT-PCR (Flu A&B, Covid) Nasopharyngeal Swab     Status:  Abnormal   Collection Time: 04/26/21  3:57 PM   Specimen: Nasopharyngeal Swab; Nasopharyngeal(NP) swabs in vial transport medium  Result Value Ref Range Status   SARS Coronavirus 2 by RT PCR POSITIVE (A) NEGATIVE Final    Comment: RESULT CALLED TO, READ BACK BY AND VERIFIED WITH: PATTY DOWD RN [id number] [facility] 04/26/21  (NOTE) SARS-CoV-2 target nucleic acids are DETECTED.  The SARS-CoV-2 RNA is generally detectable in upper respiratory specimens during the acute phase of infection. Positive results are indicative of the presence of the identified virus, but do not rule out bacterial infection or co-infection with other pathogens not detected by the test. Clinical correlation with patient history and other diagnostic information is necessary to determine patient infection status. The expected result is Negative.  Fact Sheet for Patients: EntrepreneurPulse.com.au  Fact Sheet for Healthcare Providers: IncredibleEmployment.be  This test is not yet approved or cleared by the [place] FDA and  has been authorized for detection and/or diagnosis of SARS-CoV-2 by FDA under an Emergency Use Authorization (EUA).  This EUA will remain in effect (meaning this test can be u sed) for the duration of  the COVID-19 declaration under Section 564(b)(1) of the Act, 21 U.S.C. section 360bbb-3(b)(1), unless the authorization is terminated or revoked sooner.     Influenza A by PCR NEGATIVE NEGATIVE Final   Influenza B by PCR NEGATIVE NEGATIVE Final    Comment: (NOTE) The Xpert Xpress SARS-CoV-2/FLU/RSV plus assay is intended as an aid in the diagnosis of influenza from Nasopharyngeal swab specimens and should not be used as a sole basis for treatment. Nasal washings and aspirates are unacceptable for Xpert Xpress SARS-CoV-2/FLU/RSV testing.  Fact Sheet for  Patients: EntrepreneurPulse.com.au  Fact Sheet for Healthcare Providers: IncredibleEmployment.be  This test is not yet approved or cleared by the [place] FDA and has been authorized for detection and/or diagnosis of SARS-CoV-2 by FDA under an Emergency Use Authorization (EUA). This EUA will remain in effect (meaning this test can be used) for the duration of the COVID-19 declaration under Section 564(b)(1) of the Act, 21 U.S.C. section 360bbb-3(b)(1), unless the authorization is terminated or revoked.  Performed at [facility], [address] [place], [place] [address]           Radiology Studies: No results found.      Scheduled Meds:  sodium chloride   Intravenous Once   feeding supplement  237 mL Oral BID BM   insulin aspart  0-6 Units Subcutaneous TID WC   lipase/protease/amylase  72,000 Units Oral TID WC   losartan  12.5 mg Oral Daily   [START ON 05/03/2021] pantoprazole  40 mg Intravenous Q12H   Continuous Infusions:  pantoprazole 8 mg/hr (05/01/21 1521)     LOS: 6 days    Time spent: 35 minutes    Irine Seal, MD [facility]   To contact the attending provider between 7A-7P or the covering provider during after hours 7P-7A, please log into the web site [email or web address] and access using universal [place] password for that web site. If you do not have the password, please call the hospital operator.  05/02/2021, 11:25 AM

## 2021-05-02 NOTE — Progress Notes (Signed)
Initial Nutrition Assessment  DOCUMENTATION CODES:   Underweight  INTERVENTION:   -Continue Ensure Enlive po BID, each supplement provides 350 kcal and 20 grams of protein  -MVI with minerals daily  NUTRITION DIAGNOSIS:   Increased nutrient needs related to chronic illness (pancreatic cancer s/p whipple) as evidenced by estimated needs.  GOAL:   Patient will meet greater than or equal to 90% of their needs  MONITOR:   PO intake, Supplement acceptance, Labs, Weight trends, Skin, I & O's  REASON FOR ASSESSMENT:   Consult Assessment of nutrition requirement/status  ASSESSMENT:   Manuel Olson is a 75 y.o. male with history of pancreatic cancer status post Whipple's procedure, renal carcinoma status post nephrectomy, diabetes mellitus, CAD status post tenting, status post TAVR had followed up with patient's oncologist Dr. [name] last week and over the had routine blood work which was found to be low hemoglobin.  Patient was advised to come back in a week's time to recheck his blood work and was instructed to check his stools.  Patient has been having black stools for last few days.  Pt admitted with acute GIB.  8/18- s/p EGD- revealed actively bleeding gastric anastomotic ulcer (injected and clipped)  Reviewed I/O's: +64 ml x 24 hours and +6.7 L since admission  UOP: 750 ml x 24 hours   Pt unavailable at time of visit. Attempted to speak with pt via call to hospital room phone, however, unable to reach. RD unable to obtain further nutrition-related history or complete nutrition-focused physical exam at this time.    Pt with fair appetite. Noted meal completion 50-85%.   Pt familiar to this RD from previous hospitalization. Pt typically has a good appetite, until he develops an acute illness. Pt struggles with chronic diarrhea, which he follows with GI and takes pancreatic enzymes. Pt wife is very liberal with pt's diet- she will provide him with whatever he  desires to eat.   Reviewed wt hx; pt has experienced a 6.4% wt loss over the past 3 months, which is not significant for time frame.   Pt has a history of severe malnutrition and suspect this diagnosis is ongoing.   Medications reviewed and include Creon.   Lab Results  Component Value Date   HGBA1C 7.2 (H) 03/05/2021   PTA DM medications are 8 units insulin glargine daily and 5-8 units insulin lispro TID with meals.   Labs reviewed: Na: 126, CBGS: 134-198 (inpatient orders for glycemic control are 0-6 units insulin aspart TID with meals).    Diet Order:   Diet Order             DIET SOFT Room service appropriate? Yes; Fluid consistency: Thin  Diet effective now                   EDUCATION NEEDS:   No education needs have been identified at this time  Skin:  Skin Assessment: Skin Integrity Issues: Skin Integrity Issues:: Stage I Stage I: coccyx  Last BM:  05/02/21  Height:   Ht Readings from Last 1 Encounters:  04/29/21 5' 8"  (1.727 m)    Weight:   Wt Readings from Last 1 Encounters:  04/29/21 52.6 kg    Ideal Body Weight:  70 kg  BMI:  Body mass index is 17.63 kg/m.  Estimated Nutritional Needs:   Kcal:  1900-2100  Protein:  115-130 grams  Fluid:  > 2 L    Loistine Chance, RD, LDN, [place] Registered Dietitian II  Certified Diabetes Care and Education Specialist Please refer to [facility] for RD and/or RD on-call/weekend/after hours pager

## 2021-05-02 NOTE — Evaluation (Signed)
Physical Therapy Evaluation Patient Details Name: Manuel Olson MRN: [id number] DOB: January 10, 1946 Today's Date: 05/02/2021   History of Present Illness  75yo male admitted 03/04/21 with generalized weakness and poor appetite. Of note, tested positive for Covid in [facility] ED on 02/22/21 and was discharged home at that time. PMH back pain, CAD, HTN, CVA, HLD, MI, pancreatic CA s/p whipple procedure, TAVR, shoulder arthroscopy  Clinical Impression  Pt admitted with above diagnosis.  Pt sleeping soundly upon PT entering room. Motivated to mobilize once awake, wants to get stronger and be able to continue OPPT at d/c. Gait stability improved with distance, however weaker than his baseline.   Pt currently with functional limitations due to the deficits listed below (see PT Problem List). Pt will benefit from skilled PT to increase their independence and safety with mobility to allow discharge to the venue listed below.       Follow Up Recommendations Outpatient PT (resume OPPT)    Equipment Recommendations  None recommended by PT    Recommendations for Other Services       Precautions / Restrictions Precautions Precautions: Fall Restrictions Weight Bearing Restrictions: No      Mobility  Bed Mobility Overal bed mobility: Modified Independent                  Transfers Overall transfer level: Needs assistance Equipment used: Rolling walker (2 wheeled);None Transfers: Sit to/from Stand Sit to Stand: Min guard         General transfer comment: for safety, light steadying in initial standing  Ambulation/Gait Ambulation/Gait assistance: Min guard Gait Distance (Feet): 180 Feet Assistive device: Rolling walker (2 wheeled);None Gait Pattern/deviations: Step-through pattern;Narrow base of support     General Gait Details: initially unsteady and reliant on RW, stability improved with incr distance  Stairs            Wheelchair Mobility    Modified Rankin (Stroke  Patients Only)       Balance Overall balance assessment: Mild deficits observed, not formally tested                                           Pertinent Vitals/Pain Pain Assessment: Faces Faces Pain Scale: Hurts little more Pain Location: coccyx Pain Descriptors / Indicators: Grimacing;Sore Pain Intervention(s): Premedicated before session;Monitored during session;Limited activity within patient's tolerance;Repositioned;Ice applied    Home Living Family/patient expects to be discharged to:: Private residence Living Arrangements: Spouse/significant other Available Help at Discharge: Family;Available 24 hours/day Type of Home: House Home Access: Stairs to enter   [employer] of Steps: 3 Home Layout: Two level;Able to live on main level with bedroom/bathroom Home Equipment: Gilford Rile - 2 wheels;Shower seat      Prior Function Level of Independence: Independent with assistive device(s)         Comments: No assist with ADL/IADL at home.  Patient does not participate with meal prep or home management.  Spouse assist with community mobility.     Hand Dominance        Extremity/Trunk Assessment   Upper Extremity Assessment Upper Extremity Assessment: Defer to OT evaluation    Lower Extremity Assessment Lower Extremity Assessment: Generalized weakness       Communication   Communication: HOH  Cognition Arousal/Alertness: Awake/alert Behavior During Therapy: WFL for tasks assessed/performed Overall Cognitive Status: Within Functional Limits for tasks assessed  General Comments      Exercises     Assessment/Plan    PT Assessment Patient needs continued PT services  PT Problem List Decreased strength;Decreased mobility;Decreased activity tolerance;Pain       PT Treatment Interventions DME instruction;Therapeutic exercise;Gait training;Functional mobility training;Therapeutic  activities;Patient/family education    PT Goals (Current goals can be found in the Care Plan section)  Acute Rehab PT Goals Patient Stated Goal: home PT Goal Formulation: With patient Time For Goal Achievement: 05/16/21 Potential to Achieve Goals: Good    Frequency Min 3X/week   Barriers to discharge        Co-evaluation               AM-PAC PT "6 Clicks" Mobility  Outcome Measure Help needed turning from your back to your side while in a flat bed without using bedrails?: None Help needed moving from lying on your back to sitting on the side of a flat bed without using bedrails?: None Help needed moving to and from a bed to a chair (including a wheelchair)?: A Little Help needed standing up from a chair using your arms (e.g., wheelchair or bedside chair)?: A Little Help needed to walk in hospital room?: A Little Help needed climbing 3-5 steps with a railing? : A Little 6 Click Score: 20    End of Session Equipment Utilized During Treatment: Gait belt Activity Tolerance: Patient tolerated treatment well Patient left: in bed;with call bell/phone within reach;with bed alarm set   PT Visit Diagnosis: Unsteadiness on feet (R26.81)    Time: 3151-7616 PT Time Calculation (min) (ACUTE ONLY): 20 min   Charges:   PT Evaluation $PT Eval Low Complexity: [place], PT  Acute Rehab Dept ([place]) [phone] Pager [phone]  05/02/2021   [facility] 05/02/2021, 4:23 PM

## 2021-05-03 DIAGNOSIS — R531 Weakness: Secondary | ICD-10-CM

## 2021-05-03 LAB — CBC
HCT: 26.1 % — ABNORMAL LOW (ref 39.0–52.0)
Hemoglobin: 8.5 g/dL — ABNORMAL LOW (ref 13.0–17.0)
MCH: 29.7 pg (ref 26.0–34.0)
MCHC: 32.6 g/dL (ref 30.0–36.0)
MCV: 91.3 fL (ref 80.0–100.0)
Platelets: 144 10*3/uL — ABNORMAL LOW (ref 150–400)
RBC: 2.86 MIL/uL — ABNORMAL LOW (ref 4.22–5.81)
RDW: 17.1 % — ABNORMAL HIGH (ref 11.5–15.5)
WBC: 9.9 10*3/uL (ref 4.0–10.5)
nRBC: 0 % (ref 0.0–0.2)

## 2021-05-03 LAB — BASIC METABOLIC PANEL
Anion gap: 6 (ref 5–15)
BUN: 16 mg/dL (ref 8–23)
CO2: 22 mmol/L (ref 22–32)
Calcium: 8.3 mg/dL — ABNORMAL LOW (ref 8.9–10.3)
Chloride: 102 mmol/L (ref 98–111)
Creatinine, Ser: 1.19 mg/dL (ref 0.61–1.24)
GFR, Estimated: >60 mL/min (ref >60)
Glucose, Bld: 143 mg/dL — ABNORMAL HIGH (ref 70–99)
Potassium: 3.9 mmol/L (ref 3.5–5.1)
Sodium: 130 mmol/L — ABNORMAL LOW (ref 135–145)

## 2021-05-03 LAB — GLUCOSE, CAPILLARY
Glucose-Capillary: 153 mg/dL — ABNORMAL HIGH (ref 70–99)
Glucose-Capillary: 247 mg/dL — ABNORMAL HIGH (ref 70–99)
Glucose-Capillary: 213 mg/dL — ABNORMAL HIGH (ref 70–99)
Glucose-Capillary: 158 mg/dL — ABNORMAL HIGH (ref 70–99)
Glucose-Capillary: 186 mg/dL — ABNORMAL HIGH (ref 70–99)

## 2021-05-03 MED ORDER — ATORVASTATIN CALCIUM 20 MG PO TABS: 20.0000 mg | ORAL_TABLET | Freq: Every day | ORAL | Status: AC

## 2021-05-03 MED ORDER — PANCRELIPASE (LIP-PROT-AMYL) 36000-114000 UNITS PO CPEP: 72000.0000 [IU] | ORAL_CAPSULE | Freq: Three times a day (TID) | ORAL | Status: AC

## 2021-05-03 MED ORDER — PANTOPRAZOLE SODIUM 40 MG PO TBEC: 40.0000 mg | DELAYED_RELEASE_TABLET | Freq: Two times a day (BID) | ORAL | 1 refills | Status: AC

## 2021-05-03 MED ORDER — PANTOPRAZOLE SODIUM 40 MG IV SOLR
40.0000 mg | Freq: Two times a day (BID) | INTRAVENOUS | Status: DC
  Administered 2021-05-03: 40 mg via INTRAVENOUS
  Filled 2021-05-03: qty 40

## 2021-05-03 MED ORDER — ENSURE ENLIVE PO LIQD: 237.0000 mL | Freq: Two times a day (BID) | ORAL | 12 refills | Status: AC

## 2021-05-03 MED ORDER — ADULT MULTIVITAMIN W/MINERALS CH: 1.0000 | ORAL_TABLET | Freq: Every day | ORAL | Status: AC

## 2021-05-03 NOTE — Care Management Important Message (Signed)
Important Message  Patient Details IM Letter given to the Patient. Name: Manuel Olson MRN: [id number] Date of Birth: 1945/12/19   Medicare Important Message Given:  Yes     Kerin Salen 05/03/2021, 1:17 PM

## 2021-05-03 NOTE — Progress Notes (Signed)
Mobility Specialist - Progress Note    05/03/21 1404  Mobility  Activity Ambulated in hall  Level of Assistance Independent after set-up  Assistive Device None  Distance Ambulated (ft) 350 ft  Mobility Ambulated independently in hallway  Mobility Response Tolerated well  Mobility performed by Mobility specialist  $Mobility charge 1 Mobility   Pt ambulated 350 ft in hallway with no assistive device and no reports of SOB, dizziness, or pain was presented. Pt tolerated session well and returned to bed after ambulation to await d/c with family in room.  [place] Specialist Acute Rehabilitation Services Phone: [phone] 05/03/21, 2:06 PM

## 2021-05-03 NOTE — Evaluation (Signed)
Occupational Therapy Evaluation Patient Details Name: Manuel Olson MRN: [id number] DOB: 1945/11/27 Today's Date: 05/03/2021    History of Present Illness Patient is a 75yo male admitted 03/04/21 with generalized weakness and poor appetite. Of note, tested positive for Covid in [facility] ED on 02/22/21 and was discharged home at that time. PMH back pain, CAD, HTN, CVA, HLD, MI, pancreatic CA s/p whipple procedure, TAVR, shoulder arthroscopy   Clinical Impression   Patient evaluated by Occupational Therapy with no further acute OT needs identified. All education has been completed and the patient has no further questions. Patient was noted to be at baseline for ADLs at [facility] level at this time. Patient was able to accept challenges to balance during functional mobility with continued education to keep RW on ground. Patient reported wife tells him that often. Patient was able to complete LB dressing with MI sitting on edge of bed with no LOB or c/o pain. Patient reported having outpatient OT at [facility]. Recommend to continue outpatient OT.  See below for any follow-up Occupational Therapy or equipment needs. OT is signing off. Thank you for this referral.     Follow Up Recommendations  Outpatient OT    Equipment Recommendations  None recommended by OT    Recommendations for Other Services       Precautions / Restrictions Precautions Precautions: Fall Restrictions Weight Bearing Restrictions: No      Mobility Bed Mobility Overal bed mobility: Modified Independent                  Transfers Overall transfer level: Needs assistance Equipment used: Rolling walker (2 wheeled);None Transfers: Sit to/from Stand Sit to Stand: Supervision         General transfer comment: for safety    Balance Overall balance assessment: Modified Independent                                         ADL either performed or assessed with clinical judgement   ADL Overall ADL's :  At baseline                                       General ADL Comments: patient required cues to keep RW on ground. wife present indicating patient is back to baseline at this time for ADL tasks.     Vision Patient Visual Report: No change from baseline       Perception     Praxis      Pertinent Vitals/Pain Pain Assessment: No/denies pain     Hand Dominance Right   Extremity/Trunk Assessment Upper Extremity Assessment Upper Extremity Assessment: Overall WFL for tasks assessed   Lower Extremity Assessment Lower Extremity Assessment: Defer to PT evaluation   Cervical / Trunk Assessment Cervical / Trunk Assessment: Normal   Communication Communication Communication: HOH   Cognition Arousal/Alertness: Awake/alert Behavior During Therapy: WFL for tasks assessed/performed Overall Cognitive Status: Within Functional Limits for tasks assessed                                     General Comments       Exercises     Shoulder Instructions      Home Living Family/patient expects to be discharged to::  Private residence Living Arrangements: Spouse/significant other Available Help at Discharge: Family;Available 24 hours/day Type of Home: House Home Access: Stairs to enter [employer] of Steps: 3 Entrance Stairs-Rails: None Home Layout: Two level;Able to live on main level with bedroom/bathroom     Bathroom Shower/Tub: Teacher, early years/pre: Standard Bathroom Accessibility: Yes How Accessible: Accessible via walker Home Equipment: [place] - 2 wheels;Shower seat          Prior Functioning/Environment Level of Independence: Independent with assistive device(s)        Comments: No assist with ADL/IADL at home.  Patient does not participate with meal prep or home management.  Spouse assist with community mobility.        OT Problem List: Decreased knowledge of use of DME or AE;Decreased activity  tolerance;Decreased safety awareness      OT Treatment/Interventions:      OT Goals(Current goals can be found in the care plan section) Acute Rehab OT Goals Patient Stated Goal: to go back home soon OT Goal Formulation: All assessment and education complete, DC therapy  OT Frequency:     Barriers to D/C:            Co-evaluation              AM-PAC OT "6 Clicks" Daily Activity     Outcome Measure Help from another person eating meals?: None Help from another person taking care of personal grooming?: None Help from another person toileting, which includes using toliet, bedpan, or urinal?: None Help from another person bathing (including washing, rinsing, drying)?: None Help from another person to put on and taking off regular upper body clothing?: None Help from another person to put on and taking off regular lower body clothing?: None 6 Click Score: 24   End of Session Equipment Utilized During Treatment: Rolling walker Nurse Communication: Mobility status  Activity Tolerance: Patient tolerated treatment well Patient left: in chair;with call bell/phone within reach;with chair alarm set;with family/visitor present                   Time: 9629-5284 OT Time Calculation (min): 35 min Charges:  OT General Charges $OT Visit: 1 Visit OT Evaluation $OT Eval Low Complexity: 1 Low OT Treatments $Self Care/Home Management : 8-22 mins  Jackelyn Poling OTR/L, MS Acute Rehabilitation Department Office# [phone] Pager# [phone] 05/03/2021, 11:58 AM

## 2021-05-03 NOTE — TOC Progression Note (Signed)
Transition of Care [facility]) - Progression Note    Patient Details  Name: Manuel Olson MRN: [id number] Date of Birth: 1946/06/05  [facility] CM/SW Contact  Purcell Mouton, RN Phone Number: 05/03/2021, 11:15 AM  Clinical Narrative:     Spoke with pt's wife concerning OP PT.  A call to [facility] in [place], was made.  [facility] require an order to resume PT from [facility]. Will fax [phone] [facility].   Expected Discharge Plan: OP Rehab Barriers to Discharge: No Barriers Identified  Expected Discharge Plan and Services Expected Discharge Plan: OP Rehab                                               Social Determinants of Health (SDOH) Interventions    Readmission Risk Interventions No flowsheet data found.

## 2021-05-03 NOTE — Plan of Care (Signed)

## 2021-05-03 NOTE — Discharge Summary (Signed)
Physician Discharge Summary  Manuel Olson:[id number] DOB: [birth year] DOA: 04/26/2021  PCP: [name], MD  Admit date: 04/26/2021 Discharge date: 05/03/2021  Time spent: 60 minutes  Recommendations for Outpatient Follow-up:  Follow-up with Dr. [name], gastroenterology in 2 weeks.  On follow-up patient will need a CBC done to follow-up on H&H. Follow-up with [name], MD in 2 weeks.  On follow-up patient will need a basic metabolic profile done to follow-up on electrolytes and renal function.  Patient need a CBC done to follow-up on H&H. Follow-up with Dr. Benay Spice, oncology as previously scheduled.   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  Diet recommendation: Regular diet  Filed Weights   04/26/21 1508 04/29/21 1422  Weight: 52.6 kg 52.6 kg    History of present illness:  HPI per Dr. Rose Olson is a 75 y.o. male with history of pancreatic cancer status post Whipple's procedure, renal carcinoma status post nephrectomy, diabetes mellitus, CAD status post stenting, status post TAVR had followed up with patient's oncologist Dr. Betsy Olson last week and over the had routine blood work which was found to be low hemoglobin.  Patient was advised to come back in a week's time to recheck his blood work and was instructed to check his stools.  Patient had been having black stools for last few days.  In the meantime patient also got weak and dizzy and short of breath.  Patient came to the ER.   Patient had an unremarkable EGD in April of this year by Dr. Ardis Olson gastroenterologist.   Patient was admitted in April 2021 for GI bleed at that time patient had bleeding ulcers at the anastomotic site which had to be Endo Clip.   ED Course: In the ER patient's labs show hemoglobin of 5.9 with high sensitive troponin of 31 and 29.  Creatinine 1.2.  Sodium 130.  Stool for occult blood was positive.  Patient admitted for acute GI bleed.  Patient is incidentally COVID-positive.  Chest x-ray is unremarkable and patient is not hypoxic.  EKG shows normal sinus rhythm.  Hospital Course:  1 acute probable upper GI bleed/symptomatic acute blood loss anemia -Secondary to gastric ulcer with spurting hemorrhage status post epi injection and 6 hemostatic clips per EGD 04/29/2021 -Patient with history of prior GI bleed secondary to anastomotic GJ ulcer per EGD 12/2012. -Patient noted on presentation to have a hemoglobin of 5.9 status posttransfusion of total of 3 units packed red blood cells during this hospitalization with hemoglobin stabilized at 8.5 by day of discharge.  -Patient denied any further bloody bowel movements 2 to 3 days prior to discharge and on day of discharge. -Stated improvement with upper abdominal pain post upper endoscopy, -Patient underwent tagged RBC scan 04/28/2021 which was negative for any active bleeding. -Patient underwent EGD on 04/29/2021 which showed Whipple's anatomy, 1 linear 8 to 10 mm gastric ulcer with spurting hemorrhage status post epi injection and 6 hemostatic clips with successful hemostasis.    -Per GI patient at high risk for recurrent bleeding and if patient rebleeds will require IR  evaluation with angio/embolization.   -Post EGD patient was maintained on Protonix drip for total of 72 hours and subsequently transitioned to IV PPI twice daily and will be discharged home on oral PPI twice daily with close outpatient follow-up with GI.  -Plavix held and will not be resumed on discharge due to patient's increased risk of rebleeding and in discussion with  cardiology. -Diet was advanced to a soft diet which patient tolerated.   -Patient was discharged home in stable and improved condition with outpatient follow-up with PCP and GI.    2 coronary artery disease status post MI x2 with multiple stents on Plavix/severe AS status post TAVR 2018 -Plavix held secondary to problem #1 and will not resume on discharge. -Patient placed back on Cozaar.   -Patient's statin was held during the hospitalization and will be resumed 1 week post discharge.  -Due to high risk of rebleed cardiology curb sided and per cardiology progress note, since patient's last PCI and TAVR was greater than a year ago it was safe to come off Plavix therapy as risk of bleeding was very high.   -Per cardiology may consider restarting patient on 81 mg of aspirin rather than Plavix therapy in several weeks once bleeding risk is felt to be low.   -Outpatient follow-up with cardiology as previously scheduled.    3.  History of pancreatic adenocarcinoma status post Whipple surgery 02/2018 -Status posttreatment with FOLFOX/FOLFIRINOX with adjunct gemcitabine/Abraxane. -Patient once bleeding have subsided was advanced to a soft diet which he tolerated.   -Patient subsequently placed back on home regimen of Creon 2 tablets 3 times daily with meals.   -Outpatient follow-up with oncology and general surgery as previously scheduled.  4.  Diabetes mellitus type 2 with hyperglycemia/chronic kidney disease stage IIIa -Stable. -Hemoglobin A1c 7.2 (03/05/2021) -Patient maintained on sliding scale insulin during the hospitalization.   -Outpatient follow-up.   5.  Acute kidney injury on chronic kidney disease stage IIIa -Likely secondary to prerenal azotemia.   -Improved with hydration.   6.  COVID-19 infection -Patient noted to have tested + June 2022, currently asymptomatic. -Patient likely should NOT have had repeat testing as he was asymptomatic. -Isolation precautions discontinued.  7.   Severe protein calorie malnutrition -Patient with significant muscle mass and subcutaneous fat loss on examination. -Diet advanced to a soft diet which patient tolerated.   -Patient placed on nutritional supplementation.   -Patient with discharge home with a liberalized regular diet.   -Outpatient follow-up with PCP.   8.  Pressure ulcer, POA Pressure Injury 04/27/21 Coccyx Mid;Upper Stage 1 -  Intact skin with non-blanchable redness of a localized area usually over a bony prominence. (Active)  04/27/21 0402  Location: Coccyx  Location Orientation: Mid;Upper  Staging: Stage 1 -  Intact skin with non-blanchable redness of a localized area usually over a bony prominence.  Wound Description (Comments):   Present on Admission: Yes  Continue current wound care.  9.  Chronic diarrhea -Patient with chronic diarrhea states general surgeon increased Creon to 2 tablets with meals.   -Patient diet was advanced once bleeding has subsided and patient tolerated soft diet.  Patient's Creon was resumed.   -Patient had bowel movement which were more solid and no longer watery.   -Outpatient follow-up with PCP.    10.  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 -Sodium level 1 as low as 126 from 131 during  the hospitalization.  -Likely a component of hypervolemia as patient was on IV fluids, received transfusion of 3 units packed red blood cells during this hospitalization. -Patient noted to be positive approximately 6.65 L during this hospitalization. -IV fluids with saline lock.   -Urine studies obtained.   -Patient given a dose of IV Lasix  -Sodium levels improved to 130 by day of discharge.   -Patient remained asymptomatic.   -Outpatient follow-up with PCP.         Procedures: Tagged RBC scan 04/28/2021-negative Chest x-ray 04/26/2021 Transfusion 2 units packed red blood cells  04/27/2021 Transfusion 1 unit packed red blood cells 04/28/2021 Upper endoscopy 04/29/2021 per Dr. [name]  Consultations: Gastroenterology: Dr. [name] 04/27/2021 Curb sided cardiology.  Discharge Exam: Vitals:   05/02/21 2031 05/03/21 0510  BP: 93/65 122/83  Pulse: 88 84  Resp: 18 16  Temp: 99.1 F (37.3 C) 97.7 F (36.5 C)  SpO2: 98% 97%    General: Frail.  Cachectic.  NAD. Cardiovascular: RRR no murmurs rubs or gallops.  No JVD.  No lower extremity edema. Respiratory: Lungs clear to auscultation bilaterally.  No wheezes, no crackles, no rhonchi.  Discharge Instructions   Discharge Instructions     Ambulatory referral to Physical Therapy   Complete by: As directed    Diet general   Complete by: As directed    Discharge wound care:   Complete by: As directed    As above   Increase activity slowly   Complete by: As directed       Allergies as of 05/03/2021       Reactions   Carvedilol Other (See Comments)   "Could not function when taking this"        Medication List     STOP taking these medications    clopidogrel 75 MG tablet Commonly known as: PLAVIX       TAKE these medications    acetaminophen 325 MG tablet Commonly known as: TYLENOL Take 325-650 mg by mouth every 6 (six) hours as needed for mild pain.   atorvastatin 20 MG tablet Commonly known as: LIPITOR Take 1 tablet (20 mg total) by mouth daily. Start taking on: May 10, 2021 What changed: These instructions start on May 10, 2021. If you are unsure what to do until then, ask your doctor or other care provider.   Basaglar KwikPen 100 UNIT/ML Inject 8 Units into the skin daily.   diphenoxylate-atropine 2.5-0.025 MG tablet Commonly known as: LOMOTIL Take 1 tablet by mouth 4 (four) times daily as needed for diarrhea or loose stools.   feeding supplement Liqd Take 237 mLs by mouth 2 (two) times daily between meals.   FreeStyle Libre 14 Day Sensor Misc Inject 1 patch into the skin  every 14 (fourteen) days.   insulin lispro 100 UNIT/ML KwikPen Commonly known as: HumaLOG KwikPen Inject 5-8 Units into the skin 3 (three) times daily before meals. Inject 5-8 units 3 times a day with meals. What changed:  how much to take additional instructions   lipase/protease/amylase 36000 UNITS Cpep capsule Commonly known as: Creon Take 2 capsules (72,000 Units total) by mouth 3 (three) times daily before meals. Pt taking 2 tab before meals. What changed:  medication strength See the new instructions.   losartan 25 MG tablet Commonly known as: COZAAR Take 0.5 tablets (12.5 mg total) by mouth daily.   megestrol 40 MG/ML suspension Commonly known as: MEGACE Take 200 mg by mouth daily as needed (apetite).   multivitamin with  minerals Tabs tablet Take 1 tablet by mouth daily. Start taking on: May 04, 2021   nitroGLYCERIN 0.4 MG SL tablet Commonly known as: NITROSTAT Place 1 tablet (0.4 mg total) under the tongue every 5 (five) minutes x 3 doses as needed for chest pain (if no relief after 2nd dose, proceed to the ED for an evaluation or call 911).   pantoprazole 40 MG tablet Commonly known as: PROTONIX Take 1 tablet (40 mg total) by mouth 2 (two) times daily.   Pen Needles 31G X 6 MM Misc Use as directed to check blood sugar 4 times a day.               Discharge Care Instructions  (From admission, onward)           Start     Ordered   05/03/21 0000  Discharge wound care:       Comments: As above   05/03/21 1251           Allergies  Allergen Reactions   Carvedilol Other (See Comments)    "Could not function when taking this"    Follow-up Information     [name], MD. Schedule an appointment as soon as possible for a visit in 2 week(s).   Specialty: Family Medicine Contact information: [place] [address] [phone]         Satira Sark, MD .   Specialty: Cardiology Contact information: [place] [address] [phone]         Jackquline Denmark, MD. Schedule an appointment as soon as possible for a visit in 2 week(s).   Specialties: Gastroenterology, Internal Medicine Contact information: [address] [place] [address] [phone]         Ladell Pier, MD Follow up.   Specialty: Oncology Why: Follow-up as scheduled. Contact information: [place] [address] [phone]                  The results of significant diagnostics from this hospitalization (including imaging, microbiology, ancillary and laboratory) are listed below for reference.    Significant Diagnostic Studies: NM GI Blood Loss  Result Date: 04/28/2021 CLINICAL DATA:  GI bleed. Large volume of blood per rectum last night. Right lower quadrant abdominal pain. EXAM: NUCLEAR MEDICINE GASTROINTESTINAL BLEEDING SCAN TECHNIQUE: Sequential abdominal images were obtained following intravenous administration of Tc-12mlabeled red blood cells. RADIOPHARMACEUTICALS:  20.8 mCi Tc-962mertechnetate in-vitro labeled red cells. COMPARISON:  Abdominopelvic CT 05/04/2021. FINDINGS: Imaging through 2 hours demonstrates no evidence of active gastrointestinal bleeding. There is normal blood pool and hepatic activity. Mildly prominent activity in the left upper quadrant does not move and likely is gastric. Some bladder activity is noted on the delayed images. IMPRESSION: No evidence of active gastrointestinal bleeding. Electronically Signed   By: WiRichardean Sale.D.   On: 04/28/2021 16:30   DG Chest Portable 1 View  Result Date: 04/26/2021 CLINICAL DATA:  Shortness of breath. Pt is here for weakness and dizziness from cancer center EXAM: PORTABLE CHEST 1 VIEW COMPARISON:  Chest x-ray 03/04/2021. FINDINGS: The heart size and mediastinal contours are unchanged. Status post aortic valve replacement. Aortic calcification. Hyperinflation. No focal  consolidation. Coarsened interstitial markings with no overt pulmonary edema. No pleural effusion. No pneumothorax. No acute osseous abnormality. IMPRESSION: 1. No active disease. 2. Aortic Atherosclerosis (ICD10-I70.0) and Emphysema (ICD10-J43.9). Electronically Signed   By: MoClelia Croft.  On: 04/26/2021 16:37    Microbiology: Recent Results (from the past 240 hour(s))  Resp Panel by RT-PCR (Flu A&B, Covid) Nasopharyngeal Swab     Status: Abnormal   Collection Time: 04/26/21  3:57 PM   Specimen: Nasopharyngeal Swab; Nasopharyngeal(NP) swabs in vial transport medium  Result Value Ref Range Status   SARS Coronavirus 2 by RT PCR POSITIVE (A) NEGATIVE Final    Comment: RESULT CALLED TO, READ BACK BY AND VERIFIED WITH: PATTY DOWD RN [id number] [facility] 04/26/21  (NOTE) SARS-CoV-2 target nucleic acids are DETECTED.  The SARS-CoV-2 RNA is generally detectable in upper respiratory specimens during the acute phase of infection. Positive results are indicative of the presence of the identified virus, but do not rule out bacterial infection or co-infection with other pathogens not detected by the test. Clinical correlation with patient history and other diagnostic information is necessary to determine patient infection status. The expected result is Negative.  Fact Sheet for Patients: EntrepreneurPulse.com.au  Fact Sheet for Healthcare Providers: IncredibleEmployment.be  This test is not yet approved or cleared by the [place] FDA and  has been authorized for detection and/or diagnosis of SARS-CoV-2 by FDA under an Emergency Use Authorization (EUA).  This EUA will remain in effect (meaning this test can be u sed) for the duration of  the COVID-19 declaration under Section 564(b)(1) of the Act, 21 U.S.C. section 360bbb-3(b)(1), unless the authorization is terminated or revoked sooner.     Influenza A by PCR NEGATIVE NEGATIVE Final   Influenza B by PCR  NEGATIVE NEGATIVE Final    Comment: (NOTE) The Xpert Xpress SARS-CoV-2/FLU/RSV plus assay is intended as an aid in the diagnosis of influenza from Nasopharyngeal swab specimens and should not be used as a sole basis for treatment. Nasal washings and aspirates are unacceptable for Xpert Xpress SARS-CoV-2/FLU/RSV testing.  Fact Sheet for Patients: EntrepreneurPulse.com.au  Fact Sheet for Healthcare Providers: IncredibleEmployment.be  This test is not yet approved or cleared by the [place] FDA and has been authorized for detection and/or diagnosis of SARS-CoV-2 by FDA under an Emergency Use Authorization (EUA). This EUA will remain in effect (meaning this test can be used) for the duration of the COVID-19 declaration under Section 564(b)(1) of the Act, 21 U.S.C. section 360bbb-3(b)(1), unless the authorization is terminated or revoked.  Performed at [facility], [address] [place], [place] [address]      Labs: Basic Metabolic Panel: Recent Labs  Lab 04/27/21 [phone] 04/28/21 0505 04/29/21 0424 04/30/21 0336 05/01/21 0335 05/02/21 0403 05/02/21 1559 05/03/21 0322  NA 129* 129* 132* 135 131* 126* 130* 130*  K 4.5 4.3 3.7 3.8 3.6 3.8 4.2 3.9  CL 100 99 101 106 105 100 102 102  CO2 22 22 20* 18* 21* 21* 20* 22  GLUCOSE 159* 133* 132* 112* 115* 153* 243* 143*  BUN 28* 23 22 21 13 11 11 16   CREATININE 1.34* 1.29* 1.36* 1.46* 1.17 1.14 1.36* 1.19  CALCIUM 8.6* 8.7* 8.9 8.7* 8.3* 8.3* 8.2* 8.3*  MG 2.1 1.9 1.9  --   --   --   --   --   PHOS  --  3.4 3.9  --   --   --   --   --    Liver Function Tests: Recent Labs  Lab 04/26/21 1552 04/28/21 0505 04/29/21 0424  AST 17  --   --   ALT 26  --   --   ALKPHOS 194*  --   --  BILITOT 0.5  --   --   PROT 6.1*  --   --   ALBUMIN 3.1* 2.9* 3.1*   Recent Labs  Lab 04/26/21 1552  LIPASE <10*   No results for input(s): AMMONIA in the last 168  hours. CBC: Recent Labs  Lab 04/26/21 1331 04/26/21 1552 04/27/21 0936 04/29/21 0424 04/29/21 1555 05/01/21 0335 05/01/21 1550 05/02/21 0403 05/02/21 1559 05/03/21 0322  WBC 13.6* 12.3*   < > 8.7   < > 7.8 9.5 7.6 9.9 9.9  NEUTROABS 11.2* 10.1*  --  6.6  --   --   --   --   --   --   HGB 5.9* 5.9*   < > 9.3*   < > 8.0* 8.8* 8.7* 8.9* 8.5*  HCT 17.8* 18.2*   < > 28.5*   < > 24.0* 26.9* 26.9* 27.3* 26.1*  MCV 90.4 91.5   < > 89.3   < > 91.6 91.2 92.4 93.2 91.3  PLT 358 348   < > 190   < > 136* 139* 134* 194 144*   < > = values in this interval not displayed.   Cardiac Enzymes: No results for input(s): CKTOTAL, CKMB, CKMBINDEX, TROPONINI in the last 168 hours. BNP: BNP (last 3 results) Recent Labs    04/26/21 1551  BNP 125.0*    ProBNP (last 3 results) No results for input(s): PROBNP in the last 8760 hours.  CBG: Recent Labs  Lab 05/02/21 1208 05/02/21 1644 05/02/21 2238 05/03/21 0758 05/03/21 1102  GLUCAP 247* 213* 132* 158* 186*       Signed:  Irine Seal MD.  [facility] 05/03/2021, 1:10 PM

## 2021-05-03 NOTE — Progress Notes (Signed)
RN removed patient IV. All vital signs stable. RN reviewed discharge paperwork with patient who stated understanding. Patient was in possession of all personal belongings. RN transported patient via wheelchair to entrance where wife was waiting. Patient safely entered vehicle and was transported towards home.

## 2021-05-04 ENCOUNTER — Telehealth: Payer: Self-pay

## 2021-05-04 NOTE — Telephone Encounter (Cosign Needed)
Transition Care Management Follow-up Telephone Call  Date of discharge and from where: 05/03/21 [facility] How have you been since you were released from the hospital? Doing really well Any questions or concerns? No  Items Reviewed: Did the pt receive and understand the discharge instructions provided? Yes  Medications obtained and verified? Yes  Other? No  Any new allergies since your discharge? No  Dietary orders reviewed? Yes Do you have support at home? Yes   Home Care and Equipment/Supplies: Were home health services ordered? not applicable If so, what is the name of the agency?   Has the agency set up a time to come to the patient's home? not applicable Were any new equipment or medical supplies ordered?  No What is the name of the medical supply agency?  Were you able to get the supplies/equipment? not applicable Do you have any questions related to the use of the equipment or supplies? No  Functional Questionnaire: (I = Independent and D = Dependent) ADLs: I  Bathing/Dressing- I  Meal Prep- I  Eating- I  Maintaining continence- I  Transferring/Ambulation- I  Managing Meds- I pt wife assist with all needs  Follow up appointments reviewed:  PCP Hospital f/u appt confirmed? No  pt stated he will call for appt Specialist Hospital f/u appt confirmed? No  Pt stated she will make appt 05/05/21 Are transportation arrangements needed? No  If their condition worsens, is the pt aware to call PCP or go to the Emergency Dept.? Yes Was the patient provided with contact information for the PCP's office or ED? Yes Was to pt encouraged to call back with questions or concerns? Yes

## 2021-05-05 ENCOUNTER — Telehealth: Payer: Self-pay | Admitting: Oncology

## 2021-05-05 NOTE — Telephone Encounter (Signed)
Scheduled appt per 8/24 sch msg - patient with is aware of appt on 06/15/21

## 2021-05-09 ENCOUNTER — Other Ambulatory Visit: Payer: Self-pay

## 2021-05-09 ENCOUNTER — Encounter (HOSPITAL_COMMUNITY): Payer: Self-pay | Admitting: Emergency Medicine

## 2021-05-09 ENCOUNTER — Inpatient Hospital Stay (HOSPITAL_COMMUNITY)
Admission: EM | Admit: 2021-05-09 | Discharge: 2021-05-12 | DRG: 378 | Disposition: A | Discharge status: Home / Self Care | Payer: Medicare Other | Attending: Student | Admitting: Student

## 2021-05-09 DIAGNOSIS — K648 Other hemorrhoids: Secondary | ICD-10-CM | POA: Diagnosis present

## 2021-05-09 DIAGNOSIS — E1122 Type 2 diabetes mellitus with diabetic chronic kidney disease: Secondary | ICD-10-CM | POA: Diagnosis present

## 2021-05-09 DIAGNOSIS — K6389 Other specified diseases of intestine: Secondary | ICD-10-CM | POA: Diagnosis not present

## 2021-05-09 DIAGNOSIS — K529 Noninfective gastroenteritis and colitis, unspecified: Secondary | ICD-10-CM | POA: Diagnosis present

## 2021-05-09 DIAGNOSIS — Z681 Body mass index (BMI) 19 or less, adult: Secondary | ICD-10-CM

## 2021-05-09 DIAGNOSIS — K921 Melena: Secondary | ICD-10-CM | POA: Diagnosis not present

## 2021-05-09 DIAGNOSIS — I129 Hypertensive chronic kidney disease with stage 1 through stage 4 chronic kidney disease, or unspecified chronic kidney disease: Secondary | ICD-10-CM | POA: Diagnosis present

## 2021-05-09 DIAGNOSIS — Z87891 Personal history of nicotine dependence: Secondary | ICD-10-CM

## 2021-05-09 DIAGNOSIS — Z90411 Acquired partial absence of pancreas: Secondary | ICD-10-CM | POA: Diagnosis not present

## 2021-05-09 DIAGNOSIS — R636 Underweight: Secondary | ICD-10-CM | POA: Diagnosis present

## 2021-05-09 DIAGNOSIS — K283 Acute gastrojejunal ulcer without hemorrhage or perforation: Secondary | ICD-10-CM | POA: Diagnosis not present

## 2021-05-09 DIAGNOSIS — E1165 Type 2 diabetes mellitus with hyperglycemia: Secondary | ICD-10-CM | POA: Diagnosis present

## 2021-05-09 DIAGNOSIS — Z85528 Personal history of other malignant neoplasm of kidney: Secondary | ICD-10-CM

## 2021-05-09 DIAGNOSIS — Z794 Long term (current) use of insulin: Secondary | ICD-10-CM

## 2021-05-09 DIAGNOSIS — Z8507 Personal history of malignant neoplasm of pancreas: Secondary | ICD-10-CM | POA: Diagnosis not present

## 2021-05-09 DIAGNOSIS — R9431 Abnormal electrocardiogram [ECG] [EKG]: Secondary | ICD-10-CM

## 2021-05-09 DIAGNOSIS — L89151 Pressure ulcer of sacral region, stage 1: Secondary | ICD-10-CM | POA: Diagnosis present

## 2021-05-09 DIAGNOSIS — Z8616 Personal history of COVID-19: Secondary | ICD-10-CM | POA: Diagnosis not present

## 2021-05-09 DIAGNOSIS — Z888 Allergy status to other drugs, medicaments and biological substances status: Secondary | ICD-10-CM

## 2021-05-09 DIAGNOSIS — Z955 Presence of coronary angioplasty implant and graft: Secondary | ICD-10-CM | POA: Diagnosis not present

## 2021-05-09 DIAGNOSIS — D62 Acute posthemorrhagic anemia: Secondary | ICD-10-CM | POA: Diagnosis present

## 2021-05-09 DIAGNOSIS — K254 Chronic or unspecified gastric ulcer with hemorrhage: Principal | ICD-10-CM | POA: Diagnosis present

## 2021-05-09 DIAGNOSIS — H9190 Unspecified hearing loss, unspecified ear: Secondary | ICD-10-CM | POA: Diagnosis present

## 2021-05-09 DIAGNOSIS — C641 Malignant neoplasm of right kidney, except renal pelvis: Secondary | ICD-10-CM | POA: Diagnosis not present

## 2021-05-09 DIAGNOSIS — I252 Old myocardial infarction: Secondary | ICD-10-CM

## 2021-05-09 DIAGNOSIS — E782 Mixed hyperlipidemia: Secondary | ICD-10-CM | POA: Diagnosis not present

## 2021-05-09 DIAGNOSIS — R54 Age-related physical debility: Secondary | ICD-10-CM | POA: Diagnosis present

## 2021-05-09 DIAGNOSIS — K922 Gastrointestinal hemorrhage, unspecified: Secondary | ICD-10-CM

## 2021-05-09 DIAGNOSIS — C250 Malignant neoplasm of head of pancreas: Secondary | ICD-10-CM | POA: Diagnosis present

## 2021-05-09 DIAGNOSIS — K219 Gastro-esophageal reflux disease without esophagitis: Secondary | ICD-10-CM | POA: Diagnosis present

## 2021-05-09 DIAGNOSIS — E785 Hyperlipidemia, unspecified: Secondary | ICD-10-CM | POA: Diagnosis present

## 2021-05-09 DIAGNOSIS — E871 Hypo-osmolality and hyponatremia: Secondary | ICD-10-CM | POA: Diagnosis present

## 2021-05-09 DIAGNOSIS — K649 Unspecified hemorrhoids: Secondary | ICD-10-CM | POA: Diagnosis present

## 2021-05-09 DIAGNOSIS — Z952 Presence of prosthetic heart valve: Secondary | ICD-10-CM | POA: Diagnosis not present

## 2021-05-09 DIAGNOSIS — D649 Anemia, unspecified: Secondary | ICD-10-CM | POA: Diagnosis present

## 2021-05-09 DIAGNOSIS — M479 Spondylosis, unspecified: Secondary | ICD-10-CM | POA: Diagnosis present

## 2021-05-09 DIAGNOSIS — Z832 Family history of diseases of the blood and blood-forming organs and certain disorders involving the immune mechanism: Secondary | ICD-10-CM

## 2021-05-09 DIAGNOSIS — I2510 Atherosclerotic heart disease of native coronary artery without angina pectoris: Secondary | ICD-10-CM | POA: Diagnosis present

## 2021-05-09 DIAGNOSIS — E119 Type 2 diabetes mellitus without complications: Secondary | ICD-10-CM

## 2021-05-09 DIAGNOSIS — K9081 Whipple's disease: Secondary | ICD-10-CM | POA: Diagnosis not present

## 2021-05-09 DIAGNOSIS — I10 Essential (primary) hypertension: Secondary | ICD-10-CM | POA: Diagnosis present

## 2021-05-09 DIAGNOSIS — I959 Hypotension, unspecified: Secondary | ICD-10-CM | POA: Diagnosis present

## 2021-05-09 DIAGNOSIS — Z79899 Other long term (current) drug therapy: Secondary | ICD-10-CM

## 2021-05-09 DIAGNOSIS — Z905 Acquired absence of kidney: Secondary | ICD-10-CM

## 2021-05-09 DIAGNOSIS — N182 Chronic kidney disease, stage 2 (mild): Secondary | ICD-10-CM | POA: Diagnosis present

## 2021-05-09 DIAGNOSIS — N179 Acute kidney failure, unspecified: Secondary | ICD-10-CM | POA: Diagnosis not present

## 2021-05-09 DIAGNOSIS — Z9221 Personal history of antineoplastic chemotherapy: Secondary | ICD-10-CM

## 2021-05-09 DIAGNOSIS — Z8249 Family history of ischemic heart disease and other diseases of the circulatory system: Secondary | ICD-10-CM

## 2021-05-09 DIAGNOSIS — Z8673 Personal history of transient ischemic attack (TIA), and cerebral infarction without residual deficits: Secondary | ICD-10-CM

## 2021-05-09 LAB — CBC WITH DIFFERENTIAL/PLATELET
Abs Immature Granulocytes: 0.03 10*3/uL (ref 0.00–0.07)
Basophils Absolute: 0 10*3/uL (ref 0.0–0.1)
Basophils Relative: 0 %
Eosinophils Absolute: 0.1 10*3/uL (ref 0.0–0.5)
Eosinophils Relative: 1 %
HCT: 23.4 % — ABNORMAL LOW (ref 39.0–52.0)
Hemoglobin: 7.3 g/dL — ABNORMAL LOW (ref 13.0–17.0)
Immature Granulocytes: 0 %
Lymphocytes Relative: 8 %
Lymphs Abs: 0.7 10*3/uL (ref 0.7–4.0)
MCH: 29.3 pg (ref 26.0–34.0)
MCHC: 31.2 g/dL (ref 30.0–36.0)
MCV: 94 fL (ref 80.0–100.0)
Monocytes Absolute: 1.2 10*3/uL — ABNORMAL HIGH (ref 0.1–1.0)
Monocytes Relative: 13 %
Neutro Abs: 7 10*3/uL (ref 1.7–7.7)
Neutrophils Relative %: 78 %
Platelets: 192 10*3/uL (ref 150–400)
RBC: 2.49 MIL/uL — ABNORMAL LOW (ref 4.22–5.81)
RDW: 17.5 % — ABNORMAL HIGH (ref 11.5–15.5)
WBC: 9 10*3/uL (ref 4.0–10.5)
nRBC: 0 % (ref 0.0–0.2)

## 2021-05-09 LAB — COMPREHENSIVE METABOLIC PANEL
ALT: 17 U/L (ref 0–44)
AST: 20 U/L (ref 15–41)
Albumin: 2.9 g/dL — ABNORMAL LOW (ref 3.5–5.0)
Alkaline Phosphatase: 187 U/L — ABNORMAL HIGH (ref 38–126)
Anion gap: 7 (ref 5–15)
BUN: 24 mg/dL — ABNORMAL HIGH (ref 8–23)
CO2: 25 mmol/L (ref 22–32)
Calcium: 8.5 mg/dL — ABNORMAL LOW (ref 8.9–10.3)
Chloride: 99 mmol/L (ref 98–111)
Creatinine, Ser: 1.25 mg/dL — ABNORMAL HIGH (ref 0.61–1.24)
GFR, Estimated: 59 mL/min — ABNORMAL LOW (ref >60)
Glucose, Bld: 149 mg/dL — ABNORMAL HIGH (ref 70–99)
Potassium: 4.2 mmol/L (ref 3.5–5.1)
Sodium: 131 mmol/L — ABNORMAL LOW (ref 135–145)
Total Bilirubin: 0.5 mg/dL (ref 0.3–1.2)
Total Protein: 5.9 g/dL — ABNORMAL LOW (ref 6.5–8.1)

## 2021-05-09 LAB — PREPARE RBC (CROSSMATCH)

## 2021-05-09 LAB — URINALYSIS, ROUTINE W REFLEX MICROSCOPIC
Bilirubin Urine: NEGATIVE
Glucose, UA: NEGATIVE mg/dL
Hgb urine dipstick: NEGATIVE
Ketones, ur: NEGATIVE mg/dL
Leukocytes,Ua: NEGATIVE
Nitrite: NEGATIVE
Protein, ur: NEGATIVE mg/dL
Specific Gravity, Urine: 1.014 (ref 1.005–1.030)
pH: 5 (ref 5.0–8.0)

## 2021-05-09 LAB — POC OCCULT BLOOD, ED: Fecal Occult Bld: POSITIVE — AB

## 2021-05-09 LAB — HEMOGLOBIN AND HEMATOCRIT, BLOOD
HCT: 23.6 % — ABNORMAL LOW (ref 39.0–52.0)
Hemoglobin: 7.8 g/dL — ABNORMAL LOW (ref 13.0–17.0)

## 2021-05-09 LAB — PROTIME-INR
INR: 1.1 (ref 0.8–1.2)
Prothrombin Time: 14.5 seconds (ref 11.4–15.2)

## 2021-05-09 LAB — APTT: aPTT: 31 seconds (ref 24–36)

## 2021-05-09 MED ORDER — INSULIN ASPART 100 UNIT/ML IJ SOLN: 0.0000 [IU] | Freq: Every day | INTRAMUSCULAR | Status: DC

## 2021-05-09 MED ORDER — ACETAMINOPHEN 325 MG PO TABS
650.0000 mg | ORAL_TABLET | Freq: Once | ORAL | Status: AC
  Administered 2021-05-10: 650 mg via ORAL
  Filled 2021-05-09: qty 2

## 2021-05-09 MED ORDER — LOSARTAN POTASSIUM 25 MG PO TABS
12.5000 mg | ORAL_TABLET | Freq: Every day | ORAL | Status: DC
  Administered 2021-05-10 – 2021-05-12 (×3): 12.5 mg via ORAL
  Filled 2021-05-09 (×4): qty 0.5

## 2021-05-09 MED ORDER — ACETAMINOPHEN 650 MG RE SUPP: 650.0000 mg | Freq: Four times a day (QID) | RECTAL | Status: DC | PRN

## 2021-05-09 MED ORDER — INSULIN ASPART 100 UNIT/ML IJ SOLN
0.0000 [IU] | Freq: Three times a day (TID) | INTRAMUSCULAR | Status: DC
  Administered 2021-05-10 (×2): 1 [IU] via SUBCUTANEOUS
  Administered 2021-05-12 (×2): 2 [IU] via SUBCUTANEOUS

## 2021-05-09 MED ORDER — LACTATED RINGERS IV SOLN: INTRAVENOUS | Status: DC

## 2021-05-09 MED ORDER — DIPHENHYDRAMINE HCL 25 MG PO CAPS
25.0000 mg | ORAL_CAPSULE | Freq: Once | ORAL | Status: DC
  Filled 2021-05-09: qty 1

## 2021-05-09 MED ORDER — ACETAMINOPHEN 325 MG PO TABS
650.0000 mg | ORAL_TABLET | Freq: Four times a day (QID) | ORAL | Status: DC | PRN
  Administered 2021-05-12: 650 mg via ORAL
  Filled 2021-05-09: qty 2

## 2021-05-09 MED ORDER — PANTOPRAZOLE SODIUM 40 MG PO TBEC
40.0000 mg | DELAYED_RELEASE_TABLET | Freq: Two times a day (BID) | ORAL | Status: DC
  Administered 2021-05-10: 40 mg via ORAL
  Filled 2021-05-09: qty 1

## 2021-05-09 MED ORDER — PANCRELIPASE (LIP-PROT-AMYL) 12000-38000 UNITS PO CPEP
72000.0000 [IU] | ORAL_CAPSULE | Freq: Three times a day (TID) | ORAL | Status: DC
  Administered 2021-05-11: 72000 [IU] via ORAL
  Filled 2021-05-09 (×3): qty 6

## 2021-05-09 MED ORDER — INSULIN GLARGINE 100 UNIT/ML ~~LOC~~ SOLN
8.0000 [IU] | Freq: Every day | SUBCUTANEOUS | Status: DC
  Administered 2021-05-10: 8 [IU] via SUBCUTANEOUS
  Filled 2021-05-09 (×2): qty 0.08

## 2021-05-09 MED ORDER — ATORVASTATIN CALCIUM 20 MG PO TABS
20.0000 mg | ORAL_TABLET | Freq: Every day | ORAL | Status: DC
  Administered 2021-05-10 – 2021-05-12 (×3): 20 mg via ORAL
  Filled 2021-05-09 (×4): qty 1

## 2021-05-09 MED ORDER — LACTATED RINGERS IV BOLUS
1000.0000 mL | Freq: Once | INTRAVENOUS | Status: AC
  Administered 2021-05-09: 1000 mL via INTRAVENOUS

## 2021-05-09 NOTE — H&P (Signed)
History and Physical    Manuel Olson PXT:[id number] DOB: [birth year] DOA: 05/09/2021  PCP: [name], MD   Patient coming from: Home  Chief Complaint: Blood with bowel movements  HPI: Manuel Olson is a 75 y.o. male with medical history significant for pancreatic cancer s/p whipple, also has had renal cell carcinoma s/p nephrectomy, s/p aortic replacement, CAD with stents, HTN, DMT2, anemia, PUD, HLD who was admitted 2 weeks ago with an upper GI bleed and had EGD with GI and clips& epinephrine placed at bleeding gastric vessel. Was taken off plavix on 8/16 and was not resumed at discharge. He did receive PRBC transfusion during that admission. He was discharged last week and was doing well until last night.  He reports that last night he had bright red blood in his stool which was very soft.  He complained of having some fatigue and has weakness last night.  This morning when he woke up he felt more fatigued and got dizzy if he would stand up.  He had another episode of dark red blood with bowel movement as well as some lower abdominal cramping.  Wife checked his blood pressure and it was low and brought him for evaluation.  He has not had any injury or trauma to his abdomen.  He denies any change in his diet.  He has not had any fever, chills, urinary symptoms, chest pain, palpitations or shortness of breath.  2 months ago patient had COVID-19 infection.  He has no symptoms at this time.  He is not retested for COVID now per CDC guidelines as he has had 2 positive test within the last 90 days.  He does not need COVID precautions or isolation at this time.  ED Course: Manuel Olson initially had a low blood pressure of 158 in the emergency room.  He was given IV fluid hydration and blood pressure has improved.  Lab work reveals WBC of 9000 hemoglobin 7.3 which is decreased from 8.5 when he was discharged, hematocrit 23.4 platelets 192,000, normal differential.  Sodium 131 potassium 4.2  chloride 99 bicarb 25 creatinine 1.25 (baseline 1.14-1.19), BUN 24 calcium 8.5 albumin 2.9 alkaline phosphatase 187 AST 20 ALT 17. INR 1.1.  Urinalysis negative.  Stool was Hemoccult positive.  Hospitalist service was asked to admit for further management  Review of Systems:  General: Reports fatigue. Reports dizziness when stands up. Denies fever, chills, weight loss, night sweats. Denies change in appetite HENT: Denies head trauma, headache, denies change in hearing, tinnitus.  Denies nasal congestion or bleeding.  Denies sore throat.  Denies difficulty swallowing Eyes: Denies blurry vision, pain in eye, drainage.  Denies discoloration of eyes. Neck: Denies pain.  Denies swelling.  Denies pain with movement. Cardiovascular: Denies chest pain, palpitations.  Denies edema.  Denies orthopnea Respiratory: Denies shortness of breath, cough.  Denies wheezing.  Denies sputum production Gastrointestinal: Reports dark red blood from rectum. Denies abdominal pain, swelling. Denies nausea, vomiting, diarrhea. Denies hematemesis. Musculoskeletal: Denies limitation of movement. Denies deformity or swelling. Reports chronic low back pain. Genitourinary: Denies pelvic pain.  Denies urinary frequency or hesitancy.  Denies dysuria.  Skin: Denies rash.  Denies petechiae, purpura, ecchymosis. Neurological: Denies syncope. Denies seizure activity. Denies paresthesia. Denies slurred speech, drooping face. Denies visual change. Psychiatric: Denies depression, anxiety. Denies hallucinations.  Past Medical History:  Diagnosis Date   Arthritis    back   Blood transfusion without reported diagnosis    CAD (coronary artery disease)  a. 11/2016 in [place]: STEMI s/p DES to mid LAD, DES to diagonal, DES x 2 to proximal and mid RCA b. 08/2019: NSTEMI with DES to distal RCA   Essential hypertension    Full dentures    GERD (gastroesophageal reflux disease)    History of stroke    Hyperlipidemia    Myocardial infarction  [facility])    Pancreatic cancer ([place])    Pneumonia    Renal cell carcinoma ([place])    S/P TAVR (transcatheter aortic valve replacement)    a. 08/2017:  Edwards Sapien 3 THV (size 26 mm, model #9600CM26A , serial # [id number])   Severe aortic stenosis    a. 08/2017: s/p TAVR by Dr. Angelena Form and Dr. Cyndia Bent. 2021 NO AVS on echo   Type 2 diabetes mellitus ([place])    Wears glasses    Wears hearing aid     Past Surgical History:  Procedure Laterality Date   APPENDECTOMY     CARDIAC CATHETERIZATION     CORONARY ANGIOGRAPHY N/A 08/23/2019   Procedure: CORONARY ANGIOGRAPHY;  Surgeon: Belva Crome, MD;  Location: [place] [facility];  Service: Cardiovascular;  Laterality: N/A;   CORONARY STENT INTERVENTION N/A 08/23/2019   Procedure: CORONARY STENT INTERVENTION;  Surgeon: Belva Crome, MD;  Location: [place] [facility];  Service: Cardiovascular;  Laterality: N/A;   CORONARY STENT PLACEMENT     x 4 in March 2018   ESOPHAGOGASTRODUODENOSCOPY (EGD) WITH PROPOFOL N/A 01/02/2020   Procedure: ESOPHAGOGASTRODUODENOSCOPY (EGD) WITH PROPOFOL;  Surgeon: Jackquline Denmark, MD;  [facility]: [facility];  Service: Endoscopy;  Laterality: N/A;   ESOPHAGOGASTRODUODENOSCOPY (EGD) WITH PROPOFOL N/A 04/29/2021   Procedure: ESOPHAGOGASTRODUODENOSCOPY (EGD) WITH PROPOFOL;  Surgeon: Jackquline Denmark, MD;  [facility];  Service: Endoscopy;  Laterality: N/A;   EUS N/A 08/02/2017   Procedure: UPPER ENDOSCOPIC ULTRASOUND (EUS) RADIAL;  Surgeon: [name], MD;  [facility];  Service: Endoscopy;  Laterality: N/A;   EYE SURGERY Left    HAND SURGERY Bilateral    HEMOSTASIS CLIP PLACEMENT  01/02/2020   Procedure: HEMOSTASIS CLIP PLACEMENT;  Surgeon: Jackquline Denmark, MD;  [facility];  Service: Endoscopy;;   HEMOSTASIS CLIP PLACEMENT  04/29/2021   Procedure: HEMOSTASIS CLIP PLACEMENT;  Surgeon: Jackquline Denmark, MD;  [facility];  Service: Endoscopy;;   HERNIA REPAIR Right    HOT HEMOSTASIS N/A  01/02/2020   Procedure: HOT HEMOSTASIS (ARGON PLASMA COAGULATION/BICAP);  Surgeon: Jackquline Denmark, MD;  Location: [facility];  Service: Endoscopy;  Laterality: N/A;   LAPAROSCOPY N/A 02/19/2018   Procedure: DIAGNOSTIC LAPAROSCOPY, ERAS PATHWAY;  Surgeon: [name], MD;  Location: [place];  Service: General;  Laterality: N/A;   MULTIPLE TOOTH EXTRACTIONS     NEPHRECTOMY Left 02/19/2018   Procedure: OPEN LEFT RADICAL NEPHRECTOMY;  Surgeon: Cleon Gustin, MD;  Location: [place];  Service: Urology;  Laterality: Left;   PORT-A-CATH REMOVAL N/A 06/13/2019   Procedure: PORT REMOVAL;  Surgeon: [name], MD;  Location: [place];  Service: General;  Laterality: N/A;   PORTACATH PLACEMENT N/A 09/15/2017   Procedure: INSERTION PORT-A-CATH;  Surgeon: [name], MD;  Location: [place];  Service: General;  Laterality: N/A;   RIGHT/LEFT HEART CATH AND CORONARY ANGIOGRAPHY N/A 07/05/2017   Procedure: RIGHT/LEFT HEART CATH AND CORONARY ANGIOGRAPHY;  Surgeon: [name], MD;  Location: [place] [facility];  Service: Cardiovascular;  Laterality: N/A;   SCLEROTHERAPY  01/02/2020   Procedure: SCLEROTHERAPY;  Surgeon: Jackquline Denmark, MD;  Location: [facility];  Service: Endoscopy;;   SCLEROTHERAPY  04/29/2021   Procedure: Clide Deutscher;  Surgeon: Jackquline Denmark, MD;  [facility];  Service: Endoscopy;;   SHOULDER ARTHROSCOPY WITH ROTATOR CUFF REPAIR Left    TEE WITHOUT CARDIOVERSION N/A 08/22/2017   Procedure: TRANSESOPHAGEAL ECHOCARDIOGRAM (TEE);  Surgeon: Burnell Blanks, MD;  Location: [place];  Service: Open Heart Surgery;  Laterality: N/A;   TRANSCATHETER AORTIC VALVE REPLACEMENT, TRANSFEMORAL N/A 08/22/2017   Procedure: TRANSCATHETER AORTIC VALVE REPLACEMENT, TRANSFEMORAL;  Surgeon: Burnell Blanks, MD;  Location: [place];  Service: Open Heart Surgery;  Laterality: N/A;   UPPER GASTROINTESTINAL ENDOSCOPY     WHIPPLE PROCEDURE N/A 02/19/2018   Procedure: WHIPPLE  PROCEDURE;  Surgeon: [name], MD;  Location: [place];  Service: General;  Laterality: N/A;    Social History  reports that he quit smoking about 5 years ago. His smoking use included cigarettes. He started smoking about 62 years ago. He has a 57.00 pack-year smoking history. He has never used smokeless tobacco. He reports that he does not drink alcohol and does not use drugs.  Allergies  Allergen Reactions   Carvedilol Other (See Comments)    "Could not function when taking this"    Family History  Problem Relation Age of Onset   Lupus Mother    CAD Brother    Hypertension Brother    Colon cancer Neg Hx    Esophageal cancer Neg Hx    Rectal cancer Neg Hx    Stomach cancer Neg Hx      Prior to Admission medications   Medication Sig Start Date End Date Taking? Authorizing Provider  acetaminophen (TYLENOL) 325 MG tablet Take 325-650 mg by mouth every 6 (six) hours as needed for mild pain.   Yes [provider]  diphenoxylate-atropine (LOMOTIL) 2.5-0.025 MG tablet Take 1 tablet by mouth 4 (four) times daily as needed for diarrhea or loose stools. 03/08/21  Yes Ghimire, Henreitta Leber, MD  feeding supplement (ENSURE ENLIVE / ENSURE PLUS) LIQD Take 237 mLs by mouth 2 (two) times daily between meals. Patient taking differently: Take 237 mLs by mouth 2 (two) times daily as needed (feeding suppliment). 05/03/21  Yes Eugenie Filler, MD  Insulin Glargine [facility]) 100 UNIT/ML Inject 8 Units into the skin daily.   Yes [provider]  insulin lispro (HUMALOG KWIKPEN) 100 UNIT/ML KwikPen Inject 5-8 Units into the skin 3 (three) times daily before meals. Inject 5-8 units 3 times a day with meals. Patient taking differently: Inject 2-3 Units into the skin 3 (three) times daily before meals. Sliding scale 12/04/20  Yes Philemon Kingdom, MD  lipase/protease/amylase (CREON) 36000 UNITS CPEP capsule Take 2 capsules (72,000 Units total) by mouth 3 (three) times daily before  meals. Pt taking 2 tab before meals. 05/03/21  Yes Eugenie Filler, MD  losartan (COZAAR) 25 MG tablet Take 0.5 tablets (12.5 mg total) by mouth daily. 02/18/21  Yes Strader, [place] M, PA-C  pantoprazole (PROTONIX) 40 MG tablet Take 1 tablet (40 mg total) by mouth 2 (two) times daily. 05/03/21  Yes Eugenie Filler, MD  atorvastatin (LIPITOR) 20 MG tablet Take 1 tablet (20 mg total) by mouth daily. 05/10/21   Eugenie Filler, MD  Continuous Blood Gluc Sensor (FREESTYLE LIBRE 14 DAY SENSOR) MISC Inject 1 patch into the skin every 14 (fourteen) days.    [provider]  Insulin Pen Needle (PEN NEEDLES) 31G X 6 MM MISC Use as directed to check blood sugar  4 times a day. 11/10/20   Philemon Kingdom, MD  Multiple Vitamin (MULTIVITAMIN WITH MINERALS) TABS tablet Take 1 tablet by mouth daily. 05/04/21   Eugenie Filler, MD  nitroGLYCERIN (NITROSTAT) 0.4 MG SL tablet Place 1 tablet (0.4 mg total) under the tongue every 5 (five) minutes x 3 doses as needed for chest pain (if no relief after 2nd dose, proceed to the ED for an evaluation or call 911). 01/15/21   Satira Sark, MD    Physical Exam: Vitals:   05/09/21 1730 05/09/21 1800 05/09/21 1830 05/09/21 1900  BP: (!) 153/72 (!) 153/75 (!) 158/80 (!) 160/80  Pulse: 69 68 76 72  Resp: 12 15 (!) 22 13  Temp:      TempSrc:      SpO2: 100% 100% 100% 100%  Weight:      Height:        Constitutional: NAD, calm, comfortable Vitals:   05/09/21 1730 05/09/21 1800 05/09/21 1830 05/09/21 1900  BP: (!) 153/72 (!) 153/75 (!) 158/80 (!) 160/80  Pulse: 69 68 76 72  Resp: 12 15 (!) 22 13  Temp:      TempSrc:      SpO2: 100% 100% 100% 100%  Weight:      Height:       General: Thin frail elderly man, Alert and oriented x3. Hard of hearing Eyes: EOMI, PERRL, conjunctivae pale.  Sclera nonicteric HENT:  [place]/AT, external ears normal. Nares patent without epistasis. Mucous membranes are moist. Posterior pharynx clear  Neck: Soft, normal  range of motion, supple, no masses, no thyromegaly. Trachea midline Respiratory: clear to auscultation bilaterally, no wheezing, no crackles. Normal respiratory effort. No accessory muscle use.  Cardiovascular: Regular rate and rhythm, no murmurs / rubs / gallops. No extremity edema. 2+ pedal pulses. No carotid bruits.  Abdomen: Soft, Mild periumbilical  tenderness, nondistended, no rebound or guarding.  No masses palpated. No hepatosplenomegaly. Bowel sounds normoactive Musculoskeletal: FROM. no cyanosis. No joint deformity upper and lower extremities. Normal muscle tone.  Skin: Warm, dry, intact no rashes, lesions, ulcers. No induration Neurologic: CN 2-12 grossly intact.  Normal speech.  Sensation intact to touch. Strength 5/5 in all extremities.   Psychiatric: Normal judgment and insight.  Normal mood.    Labs on Admission: I have personally reviewed following labs and imaging studies  CBC: Recent Labs  Lab 05/03/21 0322 05/09/21 1630  WBC 9.9 9.0  NEUTROABS  --  7.0  HGB 8.5* 7.3*  HCT 26.1* 23.4*  MCV 91.3 94.0  PLT 144* 962    Basic Metabolic Panel: Recent Labs  Lab 05/03/21 0322 05/09/21 1630  NA 130* 131*  K 3.9 4.2  CL 102 99  CO2 22 25  GLUCOSE 143* 149*  BUN 16 24*  CREATININE 1.19 1.25*  CALCIUM 8.3* 8.5*    GFR: Estimated Creatinine Clearance: 36.4 mL/min (A) (by C-G formula based on SCr of 1.25 mg/dL (H)).  Liver Function Tests: Recent Labs  Lab 05/09/21 1630  AST 20  ALT 17  ALKPHOS 187*  BILITOT 0.5  PROT 5.9*  ALBUMIN 2.9*    Urine analysis:    Component Value Date/Time   COLORURINE YELLOW 05/09/2021 1834   APPEARANCEUR CLEAR 05/09/2021 1834   APPEARANCEUR Clear 08/13/2020 0952   LABSPEC 1.014 05/09/2021 1834   PHURINE 5.0 05/09/2021 1834   GLUCOSEU NEGATIVE 05/09/2021 1834   HGBUR NEGATIVE 05/09/2021 1834   BILIRUBINUR NEGATIVE 05/09/2021 1834   BILIRUBINUR Negative 08/13/2020 [place]  NEGATIVE 05/09/2021 1834   PROTEINUR  NEGATIVE 05/09/2021 1834   NITRITE NEGATIVE 05/09/2021 1834   LEUKOCYTESUR NEGATIVE 05/09/2021 1834    Radiological Exams on Admission: No results found.  EKG: Independently reviewed.  EKG shows normal sinus rhythm with LVH by voltage criteria.  No acute ST elevation or depression.  QTc prolonged at 484  Assessment/Plan Principal Problem:   Lower GI bleed Manuel Olson is admitted to telemetry floor.  Check serial Hgb/Hct.  Gastroenterology is consulted. Secure chat sent to Dr. Collene Mares.  Transfuse 1 unit PRBC with drop in Hgb and symptomatic.  IVF hydration.  PPI therapy continued.   Active Problems:   Symptomatic anemia Check Hgb/Hct every 6 hours. With Manuel Olson having symptoms of dizziness and lightheadedness with the anemia will transfuse one unit of PRBC at this time. Type and screen obtained now.     Essential hypertension, benign Continue Cozaar. Monitor BP    Insulin dependent type 2 diabetes mellitus Continue basal insulin. Check blood sugar with meals and bedtime. Corrective insulin provided as needed for glycemic control.  Pt had HgbA1c 2 months ago and was 7.2. Does not need repeated at this time    AKI (acute kidney injury)  IV fluid hydration with LR at 75 ml/hr.  Check renal function and electrolytes in morning with labs    CAD (coronary artery disease) Chronic and stable.    Hyponatremia Mild hyponatremia with a sodium of 131.  IV fluid hydration with LR. Recheck electrolytes in the morning    Prolonged QT interval Avoid medications which could further prolong QT interval    Cancer of head of pancreas [facility]) s/p Whipple 02/2018   Renal cell carcinoma of right kidney [facility]) s/p nephrectomy 02/2018     DVT prophylaxis: SCDs for DVT prophylaxis. No anticoagulation with GI bleeding  Code Status:   Full Code. Discussed code status with patient and his wife  Family Communication:  Diagnosis and plan discussed with patient and his wife who is at bedside.   They verbalized understanding agree with plan.  Further recommendations to follow as clinical indicated Disposition Plan:   Patient is from:  Home  Anticipated DC to:  Home  Anticipated DC date:  Anticipate 2 midnight or more stay in the hospital  Consults called:  Gastroenterology, ER physician consulted. Sent secure chat to Dr. Collene Mares Admission status:  Inpatient  Yevonne Aline Tereza Gilham MD [facility]  How to contact the [facility] Attending or Consulting provider [place] or covering provider during after hours [place], for this patient?   Check the care team in [facility] and look for a) attending/consulting [facility] provider listed and b) the [facility] team listed Log into [email or web address] and use [place]'s universal password to access. If you do not have the password, please contact the hospital operator. Locate the [facility] provider you are looking for under [facility] and page to a number that you can be directly reached. If you still have difficulty reaching the provider, please page the [facility] (Director on Call) for the Hospitalists listed on amion for assistance.  05/09/2021, 8:04 PM

## 2021-05-09 NOTE — ED Notes (Signed)
This RN called pt spouse; [name] per request and updated on POC.

## 2021-05-09 NOTE — ED Provider Notes (Signed)
[place] EMERGENCY DEPARTMENT Provider Note  CSN: [id number] Arrival date & time: 05/09/21 1502    History Chief Complaint  Patient presents with   Weakness   Hypotension    Manuel Olson is a 75 y.o. male with history of pancreatic cancer s/p whipple, also has had renal cell carcinoma s/p nephrectomy was admitted about 2 weeks ago for UGI bleed. EGD showed a bleeding gastric ulcer. He had epinephrine and clips placed into bleeding vessel. Given transfusion with good improvement and went home 6 days ago feeling well. His Plavix was stopped during that admission and not restarted at discharge. Last night, wife reports she saw bright red blood in his stool which was diarrheal. He had another one this morning and began feeling generally weak. Wife recorded some low BP as well so she brought him back for evaluation. He reports some cramping lower abdominal pain.    Past Medical History:  Diagnosis Date   Arthritis    back   Blood transfusion without reported diagnosis    CAD (coronary artery disease)    a. 11/2016 in [place]: STEMI s/p DES to mid LAD, DES to diagonal, DES x 2 to proximal and mid RCA b. 08/2019: NSTEMI with DES to distal RCA   Essential hypertension    Full dentures    GERD (gastroesophageal reflux disease)    History of stroke    Hyperlipidemia    Myocardial infarction [facility])    Pancreatic cancer ([place])    Pneumonia    Renal cell carcinoma ([place])    S/P TAVR (transcatheter aortic valve replacement)    a. 08/2017:  Edwards Sapien 3 THV (size 26 mm, model #9600CM26A , serial # [id number])   Severe aortic stenosis    a. 08/2017: s/p TAVR by Dr. Angelena Form and Dr. Cyndia Bent. 2021 NO AVS on echo   Type 2 diabetes mellitus ([place])    Wears glasses    Wears hearing aid     Past Surgical History:  Procedure Laterality Date   APPENDECTOMY     CARDIAC CATHETERIZATION     CORONARY ANGIOGRAPHY N/A 08/23/2019   Procedure: CORONARY ANGIOGRAPHY;  Surgeon: Belva Crome, MD;   Location: [place] CV LAB;  Service: Cardiovascular;  Laterality: N/A;   CORONARY STENT INTERVENTION N/A 08/23/2019   Procedure: CORONARY STENT INTERVENTION;  Surgeon: Belva Crome, MD;  Location: [place] CV LAB;  Service: Cardiovascular;  Laterality: N/A;   CORONARY STENT PLACEMENT     x 4 in March 2018   ESOPHAGOGASTRODUODENOSCOPY (EGD) WITH PROPOFOL N/A 01/02/2020   Procedure: ESOPHAGOGASTRODUODENOSCOPY (EGD) WITH PROPOFOL;  Surgeon: Jackquline Denmark, MD;  [facility];  Service: Endoscopy;  Laterality: N/A;   ESOPHAGOGASTRODUODENOSCOPY (EGD) WITH PROPOFOL N/A 04/29/2021   Procedure: ESOPHAGOGASTRODUODENOSCOPY (EGD) WITH PROPOFOL;  Surgeon: Jackquline Denmark, MD;  [facility];  Service: Endoscopy;  Laterality: N/A;   EUS N/A 08/02/2017   Procedure: UPPER ENDOSCOPIC ULTRASOUND (EUS) RADIAL;  Surgeon: [name], MD;  [facility];  Service: Endoscopy;  Laterality: N/A;   EYE SURGERY Left    HAND SURGERY Bilateral    HEMOSTASIS CLIP PLACEMENT  01/02/2020   Procedure: HEMOSTASIS CLIP PLACEMENT;  Surgeon: Jackquline Denmark, MD;  [facility];  Service: Endoscopy;;   HEMOSTASIS CLIP PLACEMENT  04/29/2021   Procedure: HEMOSTASIS CLIP PLACEMENT;  Surgeon: Jackquline Denmark, MD;  [facility];  Service: Endoscopy;;   HERNIA REPAIR Right    HOT HEMOSTASIS N/A 01/02/2020   Procedure: HOT HEMOSTASIS (ARGON  PLASMA COAGULATION/BICAP);  Surgeon: Jackquline Denmark, MD;  Location[facility];  Service: Endoscopy;  Laterality: N/A;   LAPAROSCOPY N/A 02/19/2018   Procedure: DIAGNOSTIC LAPAROSCOPY, ERAS PATHWAY;  Surgeon: [name], MD;  Location: [place];  Service: General;  Laterality: N/A;   MULTIPLE TOOTH EXTRACTIONS     NEPHRECTOMY Left 02/19/2018   Procedure: OPEN LEFT RADICAL NEPHRECTOMY;  Surgeon: Cleon Gustin, MD;  Location: [place];  Service: Urology;  Laterality: Left;   PORT-A-CATH REMOVAL N/A 06/13/2019   Procedure: PORT REMOVAL;  Surgeon:  [name], MD;  Location: [place];  Service: General;  Laterality: N/A;   PORTACATH PLACEMENT N/A 09/15/2017   Procedure: INSERTION PORT-A-CATH;  Surgeon: [name], MD;  Location: [place];  Service: General;  Laterality: N/A;   RIGHT/LEFT HEART CATH AND CORONARY ANGIOGRAPHY N/A 07/05/2017   Procedure: RIGHT/LEFT HEART CATH AND CORONARY ANGIOGRAPHY;  Surgeon: [name], MD;  Location: [place] CV LAB;  Service: Cardiovascular;  Laterality: N/A;   SCLEROTHERAPY  01/02/2020   Procedure: SCLEROTHERAPY;  Surgeon: Jackquline Denmark, MD;  Location: [facility];  Service: Endoscopy;;   SCLEROTHERAPY  04/29/2021   Procedure: Clide Deutscher;  Surgeon: Jackquline Denmark, MD;  [facility];  Service: Endoscopy;;   SHOULDER ARTHROSCOPY WITH ROTATOR CUFF REPAIR Left    TEE WITHOUT CARDIOVERSION N/A 08/22/2017   Procedure: TRANSESOPHAGEAL ECHOCARDIOGRAM (TEE);  Surgeon: Burnell Blanks, MD;  Location: [place];  Service: Open Heart Surgery;  Laterality: N/A;   TRANSCATHETER AORTIC VALVE REPLACEMENT, TRANSFEMORAL N/A 08/22/2017   Procedure: TRANSCATHETER AORTIC VALVE REPLACEMENT, TRANSFEMORAL;  Surgeon: Burnell Blanks, MD;  Location: [place];  Service: Open Heart Surgery;  Laterality: N/A;   UPPER GASTROINTESTINAL ENDOSCOPY     WHIPPLE PROCEDURE N/A 02/19/2018   Procedure: WHIPPLE PROCEDURE;  Surgeon: [name], MD;  Location: [place];  Service: General;  Laterality: N/A;    Family History  Problem Relation Age of Onset   Lupus Mother    CAD Brother    Hypertension Brother    Colon cancer Neg Hx    Esophageal cancer Neg Hx    Rectal cancer Neg Hx    Stomach cancer Neg Hx     Social History   Tobacco Use   Smoking status: Former    Packs/day: 1.00    Years: 57.00    Pack years: 57.00    Types: Cigarettes    Start date: 09/12/1958    Quit date: 04/12/2016    Years since quitting: 5.0   Smokeless tobacco: Never  Vaping Use   Vaping Use: Never used   Substance Use Topics   Alcohol use: No   Drug use: No     Home Medications Prior to Admission medications   Medication Sig Start Date End Date Taking? Authorizing Provider  acetaminophen (TYLENOL) 325 MG tablet Take 325-650 mg by mouth every 6 (six) hours as needed for mild pain.    [provider]  atorvastatin (LIPITOR) 20 MG tablet Take 1 tablet (20 mg total) by mouth daily. 05/10/21   Eugenie Filler, MD  Continuous Blood Gluc Sensor (FREESTYLE LIBRE 14 DAY SENSOR) MISC Inject 1 patch into the skin every 14 (fourteen) days.    [provider]  diphenoxylate-atropine (LOMOTIL) 2.5-0.025 MG tablet Take 1 tablet by mouth 4 (four) times daily as needed for diarrhea or loose stools. 03/08/21   Ghimire, Henreitta Leber, MD  feeding supplement (ENSURE ENLIVE / ENSURE PLUS) LIQD Take 237 mLs by mouth 2 (two) times  daily between meals. 05/03/21   Eugenie Filler, MD  Insulin Glargine [facility]) 100 UNIT/ML Inject 8 Units into the skin daily.    [provider]  insulin lispro (HUMALOG KWIKPEN) 100 UNIT/ML KwikPen Inject 5-8 Units into the skin 3 (three) times daily before meals. Inject 5-8 units 3 times a day with meals. Patient taking differently: Inject 3-4 Units into the skin 3 (three) times daily before meals. 12/04/20   Philemon Kingdom, MD  Insulin Pen Needle (PEN NEEDLES) 31G X 6 MM MISC Use as directed to check blood sugar 4 times a day. 11/10/20   Philemon Kingdom, MD  lipase/protease/amylase (CREON) 36000 UNITS CPEP capsule Take 2 capsules (72,000 Units total) by mouth 3 (three) times daily before meals. Pt taking 2 tab before meals. 05/03/21   Eugenie Filler, MD  losartan (COZAAR) 25 MG tablet Take 0.5 tablets (12.5 mg total) by mouth daily. Patient not taking: No sig reported 02/18/21   Erma Heritage, PA-C  megestrol (MEGACE) 40 MG/ML suspension Take 200 mg by mouth daily as needed (apetite). 02/26/21   [provider]  Multiple Vitamin  (MULTIVITAMIN WITH MINERALS) TABS tablet Take 1 tablet by mouth daily. 05/04/21   Eugenie Filler, MD  nitroGLYCERIN (NITROSTAT) 0.4 MG SL tablet Place 1 tablet (0.4 mg total) under the tongue every 5 (five) minutes x 3 doses as needed for chest pain (if no relief after 2nd dose, proceed to the ED for an evaluation or call 911). 01/15/21   Satira Sark, MD  pantoprazole (PROTONIX) 40 MG tablet Take 1 tablet (40 mg total) by mouth 2 (two) times daily. 05/03/21   Eugenie Filler, MD     Allergies    Carvedilol   Review of Systems   Review of Systems A comprehensive review of systems was completed and negative except as noted in HPI.    Physical Exam BP (!) 160/80   Pulse 72   Temp 98.2 F (36.8 C) (Oral)   Resp 13   Ht 5' 8"  (1.727 m)   Wt 52 kg   SpO2 100%   BMI 17.43 kg/m   Physical Exam Vitals and nursing note reviewed.  Constitutional:      Appearance: Normal appearance.  HENT:     Head: Normocephalic and atraumatic.     Nose: Nose normal.     Mouth/Throat:     Mouth: Mucous membranes are moist.  Eyes:     Extraocular Movements: Extraocular movements intact.     Conjunctiva/sclera: Conjunctivae normal.  Cardiovascular:     Rate and Rhythm: Normal rate.  Pulmonary:     Effort: Pulmonary effort is normal.     Breath sounds: Normal breath sounds.  Abdominal:     General: Abdomen is flat.     Palpations: Abdomen is soft.     Tenderness: There is no abdominal tenderness.  Musculoskeletal:        General: No swelling. Normal range of motion.     Cervical back: Neck supple.  Skin:    General: Skin is warm and dry.  Neurological:     General: No focal deficit present.     Mental Status: He is alert.  Psychiatric:        Mood and Affect: Mood normal.     ED Results / Procedures / Treatments   Labs (all labs ordered are listed, but only abnormal results are displayed) Labs Reviewed  COMPREHENSIVE METABOLIC PANEL - Abnormal; Notable for the following  components:      Result Value   Sodium 131 (*)    Glucose, Bld 149 (*)    BUN 24 (*)    Creatinine, Ser 1.25 (*)    Calcium 8.5 (*)    Total Protein 5.9 (*)    Albumin 2.9 (*)    Alkaline Phosphatase 187 (*)    GFR, Estimated 59 (*)    All other components within normal limits  CBC WITH DIFFERENTIAL/PLATELET - Abnormal; Notable for the following components:   RBC 2.49 (*)    Hemoglobin 7.3 (*)    HCT 23.4 (*)    RDW 17.5 (*)    Monocytes Absolute 1.2 (*)    All other components within normal limits  POC OCCULT BLOOD, ED - Abnormal; Notable for the following components:   Fecal Occult Bld POSITIVE (*)    All other components within normal limits  URINALYSIS, ROUTINE W REFLEX MICROSCOPIC  PROTIME-INR  APTT    EKG EKG Interpretation  Date/Time:  Sunday May 09 2021 16:29:32 EDT Ventricular Rate:  75 PR Interval:  183 QRS Duration: 89 QT Interval:  433 QTC Calculation: 484 R Axis:   75 Text Interpretation: Duplicate Confirmed by Calvert Cantor [phone]) on 05/09/2021 4:40:56 PM  Radiology No results found.  Procedures Procedures  Medications Ordered in the ED Medications  lactated ringers bolus 1,000 mL (1,000 mLs Intravenous New Bag/Given 05/09/21 1631)     MDM Rules/Calculators/A&P MDM Patient with BRBPR, general weakness in setting of recent upper GI bleeding. Will check labs, give IVF and reassess.   ED Course  I have reviewed the triage vital signs and the nursing notes.  Pertinent labs & imaging results that were available during my care of the patient were reviewed by me and considered in my medical decision making (see chart for details).  Clinical Course as of 05/09/21 1933  Sun May 09, 2021  1650 CBC with Hgb lower than at discharge, 8.5 -> 7.3 [CS]  1703 Coags are neg.  [CS]  1710 CMP is about at baseline.  [CS]  1837 Rectal: Chaperone present, dark red stool, no mass or hemorrhoid [CS]  1854 GI paged [CS]  1909 UA is normal. Hospitalist also  paged [CS]  [place] with Dr. Tonie Griffith, Hospitalist, who will evaluate for admission.  [CS]  1933 Secure Chat sent to Drs. Selinda Michaels for non-urgent GI consult.  [CS]    Clinical Course User Index [CS] Truddie Hidden, MD    Final Clinical Impression(s) / ED Diagnoses Final diagnoses:  Lower GI bleeding    Rx / DC Orders ED Discharge Orders     None        Truddie Hidden, MD 05/09/21 [phone]

## 2021-05-09 NOTE — ED Triage Notes (Signed)
Arrives with wife, states he was discharged recently and had a BM yesterday (Sat night) full of bright red blood, had a BM this morning that was bloody, red and black. Wife reports dizziness, and weakness. Wife has been taking BP at home and they have been in the 90s-low 100s. Stopped Plavix 8/16. No emesis.

## 2021-05-09 NOTE — ED Notes (Signed)
Admitting MD at bedside.

## 2021-05-10 DIAGNOSIS — K283 Acute gastrojejunal ulcer without hemorrhage or perforation: Secondary | ICD-10-CM

## 2021-05-10 LAB — BASIC METABOLIC PANEL WITH GFR
Anion gap: 5 (ref 5–15)
BUN: 20 mg/dL (ref 8–23)
CO2: 27 mmol/L (ref 22–32)
Calcium: 8.4 mg/dL — ABNORMAL LOW (ref 8.9–10.3)
Chloride: 100 mmol/L (ref 98–111)
Creatinine, Ser: 1.25 mg/dL — ABNORMAL HIGH (ref 0.61–1.24)
GFR, Estimated: 59 mL/min — ABNORMAL LOW
Glucose, Bld: 158 mg/dL — ABNORMAL HIGH (ref 70–99)
Potassium: 4 mmol/L (ref 3.5–5.1)
Sodium: 132 mmol/L — ABNORMAL LOW (ref 135–145)

## 2021-05-10 LAB — GLUCOSE, CAPILLARY
Glucose-Capillary: 125 mg/dL — ABNORMAL HIGH (ref 70–99)
Glucose-Capillary: 130 mg/dL — ABNORMAL HIGH (ref 70–99)
Glucose-Capillary: 110 mg/dL — ABNORMAL HIGH (ref 70–99)
Glucose-Capillary: 94 mg/dL (ref 70–99)

## 2021-05-10 LAB — CBC
HCT: 24.2 % — ABNORMAL LOW (ref 39.0–52.0)
Hemoglobin: 8.2 g/dL — ABNORMAL LOW (ref 13.0–17.0)
MCH: 30.5 pg (ref 26.0–34.0)
MCHC: 33.9 g/dL (ref 30.0–36.0)
MCV: 90 fL (ref 80.0–100.0)
Platelets: 146 10*3/uL — ABNORMAL LOW (ref 150–400)
RBC: 2.69 MIL/uL — ABNORMAL LOW (ref 4.22–5.81)
RDW: 16.2 % — ABNORMAL HIGH (ref 11.5–15.5)
WBC: 6.8 10*3/uL (ref 4.0–10.5)
nRBC: 0 % (ref 0.0–0.2)

## 2021-05-10 LAB — HEMOGLOBIN AND HEMATOCRIT, BLOOD
HCT: 25.9 % — ABNORMAL LOW (ref 39.0–52.0)
Hemoglobin: 8.7 g/dL — ABNORMAL LOW (ref 13.0–17.0)

## 2021-05-10 MED ORDER — INSULIN GLARGINE-YFGN 100 UNIT/ML ~~LOC~~ SOLN
8.0000 [IU] | Freq: Every day | SUBCUTANEOUS | Status: DC
  Administered 2021-05-12: 8 [IU] via SUBCUTANEOUS
  Filled 2021-05-10 (×2): qty 0.08

## 2021-05-10 MED ORDER — PANTOPRAZOLE SODIUM 40 MG IV SOLR
40.0000 mg | Freq: Two times a day (BID) | INTRAVENOUS | Status: DC
  Administered 2021-05-10 – 2021-05-12 (×4): 40 mg via INTRAVENOUS
  Filled 2021-05-10: qty 40

## 2021-05-10 MED ORDER — HYDROCORTISONE (PERIANAL) 2.5 % EX CREA
TOPICAL_CREAM | Freq: Two times a day (BID) | CUTANEOUS | Status: DC
  Filled 2021-05-10 (×2): qty 28.35

## 2021-05-10 NOTE — Progress Notes (Signed)
PROGRESS NOTE    BLADE SCHEFF  GYB:[id number] DOB: [birth year] DOA: 05/09/2021 PCP: [name], MD     Brief Narrative:  Manuel Olson is a 75 y.o. male with medical history significant for pancreatic cancer s/p whipple, also has had renal cell carcinoma s/p nephrectomy, s/p aortic replacement, CAD with stents, HTN, DMT2, anemia, PUD, HLD who was admitted 2 weeks ago with an upper GI bleed and had EGD with GI and clips& epinephrine placed at bleeding gastric vessel. Was taken off plavix on 8/16 and was not resumed at discharge. He did receive PRBC transfusion during that admission. He was discharged last week and was doing well until last night.  He reports that last night he had bright red blood in his stool which was very soft.  He complained of having some fatigue and has weakness last night.  This morning when he woke up he felt more fatigued and got dizzy if he would stand up.  He had another episode of dark red blood with bowel movement as well as some lower abdominal cramping.  Wife checked his blood pressure and it was low and brought him for evaluation.    New events last 24 hours / Subjective: No new complaints overnight.  Assessment & Plan:   Principal Problem:   Lower GI bleed Active Problems:   Cancer of head of pancreas [facility]) s/p Whipple 02/2018   Essential hypertension, benign   CAD (coronary artery disease)   Insulin dependent type 2 diabetes mellitus ([place])   Hyponatremia   Renal cell carcinoma of right kidney (H[facility]) s/p nephrectomy 02/2018   Symptomatic anemia   AKI (acute kidney injury) ([place])   Prolonged QT interval   Recurrent GI bleed with symptomatic anemia -GI consulted -Transfused 1 unit packed red blood cell 8/29 -Trend CBC -Continue PPI  Hypertension -Continue Cozaar  HLD -Continue lipitor   Diabetes mellitus type 2 -Continue Lantus, sliding scale insulin  CKD stage IIa -Baseline creatinine 1.1-1.2 -At baseline   CAD -Plavix has  been discontinued since previous hospital stay  Cancer of head of pancreas [facility]) s/p Whipple 02/2018 Renal cell carcinoma of right kidney ([place]) s/p nephrectomy 02/2018  In agreement with assessment of the pressure ulcer as below:  Pressure Injury 04/27/21 Coccyx Mid;Upper Stage 1 -  Intact skin with non-blanchable redness of a localized area usually over a bony prominence. (Active)  04/27/21 0402  Location: Coccyx  Location Orientation: Mid;Upper  Staging: Stage 1 -  Intact skin with non-blanchable redness of a localized area usually over a bony prominence.  Wound Description (Comments):   Present on Admission: Yes         DVT prophylaxis:  SCDs Start: 05/09/21 2217  Code Status:     Code Status Orders  (From admission, onward)           Start     Ordered   05/09/21 2217  Full code  Continuous        05/09/21 2217           Code Status History     Date Active Date Inactive Code Status Order ID Comments User Context   04/26/2021 2038 05/03/2021 2149 Full Code [id number]  Rise Patience, MD ED   03/04/2021 2328 03/08/2021 1912 Full Code [id number]  Shela Leff, MD ED   12/30/2019 2349 01/05/2020 1509 Full Code [id number]  Rise Patience, MD ED   08/22/2019 2326 08/24/2019 1509 Full Code [id number]  Bethena Roys, MD Inpatient  04/03/2018 1713 04/05/2018 1754 Full Code [id number]  Kerney Elbe, DO Inpatient   02/27/2018 1730 03/03/2018 1350 Full Code [id number]  Elizabeth Sauer Inpatient   02/27/2018 1730 02/27/2018 1730 Full Code [id number]  Elizabeth Sauer Inpatient   02/19/2018 1634 02/27/2018 1720 Full Code [id number]  [name], MD Inpatient   09/26/2017 1751 10/02/2017 1739 Full Code [id number]  Phillips Grout, MD Inpatient   08/22/2017 1805 08/24/2017 1351 Full Code [id number]  Gaye Pollack, MD Inpatient   07/05/2017 0925 07/05/2017 1534 Full Code [id number]  [name], MD Inpatient      Advance Directive  Documentation    Flowsheet Row Most Recent Value  Type of Advance Directive --  [Unknown]  Pre-existing out of facility DNR order (yellow form or pink MOST form) --  "MOST" Form in Place? --      Family Communication: Spouse at bedside Disposition Plan:  Status is: Inpatient  Remains inpatient appropriate because:Inpatient level of care appropriate due to severity of illness  Dispo: The patient is from: Home              Anticipated d/c is to: Home              Patient currently is not medically stable to d/c.   Difficult to place patient No      Consultants:  GI  Procedures:  None  Antimicrobials:  Anti-infectives (From admission, onward)    None        Objective: Vitals:   05/10/21 0800 05/10/21 1000 05/10/21 1100 05/10/21 1200  BP:      Pulse:      Resp: 12 12 16 16   Temp:      TempSrc:      SpO2:      Weight:      Height:        Intake/Output Summary (Last 24 hours) at 05/10/2021 1222 Last data filed at 05/10/2021 0323 Gross per 24 hour  Intake 866.44 ml  Output 150 ml  Net 716.44 ml   Filed Weights   05/09/21 1539  Weight: 52 kg    Examination:  General exam: Appears calm and comfortable, thin and frail appearing Respiratory system: Clear to auscultation. Respiratory effort normal. No respiratory distress. No conversational dyspnea.  Cardiovascular system: S1 & S2 heard, RRR. No murmurs. No pedal edema. Gastrointestinal system: Abdomen is nondistended, soft and nontender. Normal bowel sounds heard. Central nervous system: Alert and oriented. No focal neurological deficits. Speech clear.  Extremities: Symmetric in appearance  Skin: No rashes, lesions or ulcers on exposed skin  Psychiatry: Judgement and insight appear normal. Mood & affect appropriate.   Data Reviewed: I have personally reviewed following labs and imaging studies  CBC: Recent Labs  Lab 05/09/21 1630 05/09/21 2236 05/10/21 0408 05/10/21 1014  WBC 9.0  --  6.8  --    NEUTROABS 7.0  --   --   --   HGB 7.3* 7.8* 8.2* 8.7*  HCT 23.4* 23.6* 24.2* 25.9*  MCV 94.0  --  90.0  --   PLT 192  --  146*  --    Basic Metabolic Panel: Recent Labs  Lab 05/09/21 1630 05/10/21 0408  NA 131* 132*  K 4.2 4.0  CL 99 100  CO2 25 27  GLUCOSE 149* 158*  BUN 24* 20  CREATININE 1.25* 1.25*  CALCIUM 8.5* 8.4*   GFR: Estimated Creatinine Clearance: 36.4 mL/min (A) (by C-G formula based on SCr of 1.25  mg/dL (H)). Liver Function Tests: Recent Labs  Lab 05/09/21 1630  AST 20  ALT 17  ALKPHOS 187*  BILITOT 0.5  PROT 5.9*  ALBUMIN 2.9*   No results for input(s): LIPASE, AMYLASE in the last 168 hours. No results for input(s): AMMONIA in the last 168 hours. Coagulation Profile: Recent Labs  Lab 05/09/21 1630  INR 1.1   Cardiac Enzymes: No results for input(s): CKTOTAL, CKMB, CKMBINDEX, TROPONINI in the last 168 hours. BNP (last 3 results) No results for input(s): PROBNP in the last 8760 hours. HbA1C: No results for input(s): HGBA1C in the last 72 hours. CBG: Recent Labs  Lab 05/10/21 0804 05/10/21 1158  GLUCAP 125* 130*   Lipid Profile: No results for input(s): CHOL, HDL, LDLCALC, TRIG, CHOLHDL, LDLDIRECT in the last 72 hours. Thyroid Function Tests: No results for input(s): TSH, T4TOTAL, FREET4, T3FREE, THYROIDAB in the last 72 hours. Anemia Panel: No results for input(s): VITAMINB12, FOLATE, FERRITIN, TIBC, IRON, RETICCTPCT in the last 72 hours. Sepsis Labs: No results for input(s): PROCALCITON, LATICACIDVEN in the last 168 hours.  No results found for this or any previous visit (from the past 240 hour(s)).    Radiology Studies: No results found.    Scheduled Meds:  atorvastatin  20 mg Oral Daily   diphenhydrAMINE  25 mg Oral Once   hydrocortisone   Rectal BID   insulin aspart  0-5 Units Subcutaneous QHS   insulin aspart  0-9 Units Subcutaneous TID WC   insulin glargine  8 Units Subcutaneous Daily   lipase/protease/amylase  72,000  Units Oral TID AC   losartan  12.5 mg Oral Daily   pantoprazole  40 mg Oral BID   Continuous Infusions:  lactated ringers 75 mL/hr at 05/10/21 0323     LOS: 1 day      Time spent: 25 minutes   Manuel Phi, DO [facility] 05/10/2021, 12:22 PM   Available via Epic secure chat 7am-7pm After these hours, please refer to coverage provider listed on [email or web address]

## 2021-05-10 NOTE — Progress Notes (Signed)
Plan is for EGD tomorrow at 11:30 am. We discussed with patient and he agrees to proceed. I attempted to call patient's wife at home and on her mobile but got only voice mails. I left a message on home phone stating that the plan was for EGD tomorrow ( we had briefly discussed this when I met her in the room earlier this am).

## 2021-05-10 NOTE — Plan of Care (Signed)
  Problem: Activity: Goal: Risk for activity intolerance will decrease Outcome: Progressing   Problem: Nutrition: Goal: Adequate nutrition will be maintained Outcome: Progressing   Problem: Elimination: Goal: Will not experience complications related to urinary retention Outcome: Progressing   Problem: Safety: Goal: Ability to remain free from injury will improve Outcome: Progressing   Problem: Elimination: Goal: Will not experience complications related to bowel motility Outcome: Not Progressing

## 2021-05-10 NOTE — H&P (View-Only) (Signed)
Referring Provider:  [facility]         Primary Care Physician:  [name], MD Primary Gastroenterologist:  Oretha Caprice, MD       Reason for Consultation:   GI bleed                ASSESSMENT / PLAN   # 75 yo male with history of pancreatic adenocarcinoma. 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  Adjuvant chemotherapy completed 06/2018. Followed by Dr. Benay Spice.  # Painless rectal bleeding. Admitted several days ago with upper GI bleed in form of maroon stool (on Plavix) secondary to anastomotic bleed, s/p Epi injection and clip placement with control on bleeding. Discharged home on off Plavix on 8/22. Now admitted with rectal bleeding ( episode 8/27 and yesterday am 8/28). No blood in stool today. Cannot exclude recurrent upper GI bleed ( brisk bleed as patient's wife reports decline in BP at time of bleeding) but seem less likely given recent endoscopic therapy and he has not resumed Plavix. [name] BUN was minimally elevated but creatinine also up. Need to consider lower source of bleeding such as hemorrhoids.  --No active bleeding today and DRE negative for blood --Empirically treat for internal hemorrhoid bleeding --At some point he should have a colonoscopy, I'm just not sure he is physically strong enough for it right now.  --Continue BID PPI --Clear liquids okay for now  # Acute on chronic anemia. His hgb in ED yesterday was 7.3, down from 8.5 at time of recent hospital discharge. Hgb improved to 8.7 post 1 uPRBC   # Prolonged QT interval   # Chronic diarrhea post Whipple. Takes Creon and uses Lomotil as needed.  --Continue home Creon when eating again.  --Sounds like he still has significant diarrhea despite creon and as needed Lomotil. Can treat diarrhea more aggressively in outpatient  setting. We can give him scheduled lomotil BID and increase to TID or add imodium if needed.    # Probable malnutrition. Muscle wasting, Albumin 2.8.   # AKI    HISTORY OF PRESENT ILLNESS                                                                                                                         Chief Complaint: rectal bleeding  Manuel Olson is a 75 y.o. male with a past medical history significant for pancreatic adenocarcinoma ( node, GI bleed from [place] anastomotic ulcer in 2021, CAD / STEMI s/p DES in 2020, HTN, hyperlipidemia, CVA, severe AS s/p TAVR in 2021, DM, RCC, COVID infection   02/16/21 office visit for follow up on abdominal pain. EGD CT scan negative for a source of pain. Pain adhesion related? Though no sign of recent cancer it isn't impossible given node positive. KUB obtain >>negative.   8/15-8/22 Admission for upper GI bleed /  melena on Plavix. He required transfusion of 3 uPRBCs for hgb of 5.9.  Tagged RBC scan negative. Inpatient EGD  8/18 showed Whipple's anatomy, a gastric anastomotic ulcer which was actively bleeding. Area injected with epi and 6 hemostatic clips were place with successful hemostasis. At time of discharge patient's hgb was up 8.5.   05/09/21 Patient presented to ED yesterday with weakness / hypotension. Wife noted red blood in stool. Plavix has been on hold since last admission. In ED his hgb 2was 7.3. FOBT +. Wife helps with history. Patient left the hospital last Monday. He felt great until Saturday when he had an episode of diarrhea with bright red blood. The following morning he had another episodes . He became weak and dizzy and wife says his blood pressure started falling ( systolic was ~ 510 at lowest point). No abdominal pain or rectal pain. No further bleeding yesterday afternoon and this am he no blood in stool. He has chronic diarrhea present since Whipple.   PREVIOUS ENDOSCOPIC EVALUATIONS    04/29/21 EGD for GI  bleed Findings: Esophagus normal. Whipple's anatomy. One linear anastomotic gastric ulcer with spurting hemorrhage ([place] Class Ia) was found at the anastomosis. The lesion was 8-10 mm in largest dimension. Area was successfully injected with 6 mL of a 1:10,000 solution of epinephrine for drug delivery. To stop active bleeding, six hemostatic clips were successfully placed (MR conditional). 3 misfired. There was no bleeding at the end of the procedure. 3 previous clips were noted at anastomosis away from the above ulcer. The afferent and efferent limbs were normal.  12/16/20 EGD Typical post Whipple anatomy. 3 endoclips remain adherent to the GE anastomosis. No sign of ulcer or obvious foreign body reaction at the site. The mucosa at the anastomosis was fairly normal appearing, one region was a bit more edematous that the rest and I biopsied this with forceps (not overtly neoplastic appearing). - Afferent and efferent limbs were normal appearing. - The examination was otherwise normal  Surgical [P], abnormal mucosa at gastric whipple anastomosis - HYPERPLASTIC GASTRIC MUCOSA AND UNREMARKABLE DUODENAL MUCOSA CONSISTENT WITH ANASTOMOSIS SITE. NO MALIGNANCY IDENTIFIED.  Past Medical History:  Diagnosis Date   Arthritis    back   Blood transfusion without reported diagnosis    CAD (coronary artery disease)    a. 11/2016 in [place]: STEMI s/p DES to mid LAD, DES to diagonal, DES x 2 to proximal and mid RCA b. 08/2019: NSTEMI with DES to distal RCA   Essential hypertension    Full dentures    GERD (gastroesophageal reflux disease)    History of stroke    Hyperlipidemia    Myocardial infarction [facility])    Pancreatic cancer ([place])    Pneumonia    Renal cell carcinoma ([place])    S/P TAVR (transcatheter aortic valve replacement)    a. 08/2017:  Edwards Sapien 3 THV (size 26 mm, model #9600CM26A , serial # [id number])   Severe aortic stenosis    a. 08/2017: s/p TAVR by Dr. Angelena Form and Dr. Cyndia Bent.  2021 NO AVS on echo   Type 2 diabetes mellitus ([place])    Wears glasses    Wears hearing aid     Past Surgical History:  Procedure Laterality Date   APPENDECTOMY     CARDIAC CATHETERIZATION     CORONARY ANGIOGRAPHY N/A 08/23/2019   Procedure: CORONARY ANGIOGRAPHY;  Surgeon: Belva Crome, MD;  Location: [place] [facility];  Service: Cardiovascular;  Laterality: N/A;   CORONARY STENT  INTERVENTION N/A 08/23/2019   Procedure: CORONARY STENT INTERVENTION;  Surgeon: Belva Crome, MD;  Location: [place] [facility];  Service: Cardiovascular;  Laterality: N/A;   CORONARY STENT PLACEMENT     x 4 in March 2018   ESOPHAGOGASTRODUODENOSCOPY (EGD) WITH PROPOFOL N/A 01/02/2020   Procedure: ESOPHAGOGASTRODUODENOSCOPY (EGD) WITH PROPOFOL;  Surgeon: Jackquline Denmark, MD;  [facility];  Service: Endoscopy;  Laterality: N/A;   ESOPHAGOGASTRODUODENOSCOPY (EGD) WITH PROPOFOL N/A 04/29/2021   Procedure: ESOPHAGOGASTRODUODENOSCOPY (EGD) WITH PROPOFOL;  Surgeon: Jackquline Denmark, MD;  [facility]: [facility];  Service: Endoscopy;  Laterality: N/A;   EUS N/A 08/02/2017   Procedure: UPPER ENDOSCOPIC ULTRASOUND (EUS) RADIAL;  Surgeon: [name], MD;  [facility];  Service: Endoscopy;  Laterality: N/A;   EYE SURGERY Left    HAND SURGERY Bilateral    HEMOSTASIS CLIP PLACEMENT  01/02/2020   Procedure: HEMOSTASIS CLIP PLACEMENT;  Surgeon: Jackquline Denmark, MD;  [facility];  Service: Endoscopy;;   HEMOSTASIS CLIP PLACEMENT  04/29/2021   Procedure: HEMOSTASIS CLIP PLACEMENT;  Surgeon: Jackquline Denmark, MD;  [facility];  Service: Endoscopy;;   HERNIA REPAIR Right    HOT HEMOSTASIS N/A 01/02/2020   Procedure: HOT HEMOSTASIS (ARGON PLASMA COAGULATION/BICAP);  Surgeon: Jackquline Denmark, MD;  [facility];  Service: Endoscopy;  Laterality: N/A;   LAPAROSCOPY N/A 02/19/2018   Procedure: DIAGNOSTIC LAPAROSCOPY, ERAS PATHWAY;  Surgeon: [name], MD;  Location: [place];  Service:  General;  Laterality: N/A;   MULTIPLE TOOTH EXTRACTIONS     NEPHRECTOMY Left 02/19/2018   Procedure: OPEN LEFT RADICAL NEPHRECTOMY;  Surgeon: Cleon Gustin, MD;  Location: [place];  Service: Urology;  Laterality: Left;   PORT-A-CATH REMOVAL N/A 06/13/2019   Procedure: PORT REMOVAL;  Surgeon: [name], MD;  Location: [place];  Service: General;  Laterality: N/A;   PORTACATH PLACEMENT N/A 09/15/2017   Procedure: INSERTION PORT-A-CATH;  Surgeon: [name], MD;  Location: [place];  Service: General;  Laterality: N/A;   RIGHT/LEFT HEART CATH AND CORONARY ANGIOGRAPHY N/A 07/05/2017   Procedure: RIGHT/LEFT HEART CATH AND CORONARY ANGIOGRAPHY;  Surgeon: [name], MD;  Location: [place] [facility];  Service: Cardiovascular;  Laterality: N/A;   SCLEROTHERAPY  01/02/2020   Procedure: SCLEROTHERAPY;  Surgeon: Jackquline Denmark, MD;  Location[facility];  Service: Endoscopy;;   SCLEROTHERAPY  04/29/2021   Procedure: Clide Deutscher;  Surgeon: Jackquline Denmark, MD;  [facility];  Service: Endoscopy;;   SHOULDER ARTHROSCOPY WITH ROTATOR CUFF REPAIR Left    TEE WITHOUT CARDIOVERSION N/A 08/22/2017   Procedure: TRANSESOPHAGEAL ECHOCARDIOGRAM (TEE);  Surgeon: Burnell Blanks, MD;  Location: [place];  Service: Open Heart Surgery;  Laterality: N/A;   TRANSCATHETER AORTIC VALVE REPLACEMENT, TRANSFEMORAL N/A 08/22/2017   Procedure: TRANSCATHETER AORTIC VALVE REPLACEMENT, TRANSFEMORAL;  Surgeon: Burnell Blanks, MD;  Location: [place];  Service: Open Heart Surgery;  Laterality: N/A;   UPPER GASTROINTESTINAL ENDOSCOPY     WHIPPLE PROCEDURE N/A 02/19/2018   Procedure: WHIPPLE PROCEDURE;  Surgeon: [name], MD;  Location: [place];  Service: General;  Laterality: N/A;    Prior to Admission medications   Medication Sig Start Date End Date Taking? Authorizing Provider  acetaminophen (TYLENOL) 325 MG tablet Take 325-650 mg by mouth every 6 (six) hours as needed for mild  pain.   Yes [provider]  diphenoxylate-atropine (LOMOTIL) 2.5-0.025 MG tablet Take 1 tablet by mouth 4 (four) times daily as needed for diarrhea or loose stools. 03/08/21  Yes Ghimire, Henreitta Leber, MD  feeding supplement (ENSURE ENLIVE / ENSURE PLUS) LIQD Take 237 mLs by mouth 2 (two) times daily between meals. Patient taking differently: Take 237 mLs by mouth 2 (two) times daily as needed (feeding suppliment). 05/03/21  Yes Eugenie Filler, MD  Insulin Glargine [facility]) 100 UNIT/ML Inject 8 Units into the skin daily.   Yes [provider]  insulin lispro (HUMALOG KWIKPEN) 100 UNIT/ML KwikPen Inject 5-8 Units into the skin 3 (three) times daily before meals. Inject 5-8 units 3 times a day with meals. Patient taking differently: Inject 2-3 Units into the skin 3 (three) times daily before meals. Sliding scale 12/04/20  Yes Philemon Kingdom, MD  lipase/protease/amylase (CREON) 36000 UNITS CPEP capsule Take 2 capsules (72,000 Units total) by mouth 3 (three) times daily before meals. Pt taking 2 tab before meals. 05/03/21  Yes Eugenie Filler, MD  losartan (COZAAR) 25 MG tablet Take 0.5 tablets (12.5 mg total) by mouth daily. 02/18/21  Yes Strader, [place] M, PA-C  pantoprazole (PROTONIX) 40 MG tablet Take 1 tablet (40 mg total) by mouth 2 (two) times daily. 05/03/21  Yes Eugenie Filler, MD  atorvastatin (LIPITOR) 20 MG tablet Take 1 tablet (20 mg total) by mouth daily. 05/10/21   Eugenie Filler, MD  Continuous Blood Gluc Sensor (FREESTYLE LIBRE 14 DAY SENSOR) MISC Inject 1 patch into the skin every 14 (fourteen) days.    [provider]  Insulin Pen Needle (PEN NEEDLES) 31G X 6 MM MISC Use as directed to check blood sugar 4 times a day. 11/10/20   Philemon Kingdom, MD  Multiple Vitamin (MULTIVITAMIN WITH MINERALS) TABS tablet Take 1 tablet by mouth daily. 05/04/21   Eugenie Filler, MD  nitroGLYCERIN (NITROSTAT) 0.4 MG SL tablet Place 1 tablet (0.4 mg total)  under the tongue every 5 (five) minutes x 3 doses as needed for chest pain (if no relief after 2nd dose, proceed to the ED for an evaluation or call 911). 01/15/21   Satira Sark, MD    Current Facility-Administered Medications  Medication Dose Route Frequency Provider Last Rate Last Admin   acetaminophen (TYLENOL) tablet 650 mg  650 mg Oral Q6H PRN Chotiner, Yevonne Aline, MD       Or   acetaminophen (TYLENOL) suppository 650 mg  650 mg Rectal Q6H PRN Chotiner, Yevonne Aline, MD       atorvastatin (LIPITOR) tablet 20 mg  20 mg Oral Daily Chotiner, Yevonne Aline, MD       diphenhydrAMINE (BENADRYL) capsule 25 mg  25 mg Oral Once Chotiner, Yevonne Aline, MD       insulin aspart (novoLOG) injection 0-5 Units  0-5 Units Subcutaneous QHS Chotiner, Yevonne Aline, MD       insulin aspart (novoLOG) injection 0-9 Units  0-9 Units Subcutaneous TID WC Chotiner, Yevonne Aline, MD   1 Units at 05/10/21 0836   insulin glargine (LANTUS) injection 8 Units  8 Units Subcutaneous Daily Chotiner, Yevonne Aline, MD       lactated ringers infusion   Intravenous Continuous Chotiner, Yevonne Aline, MD 75 mL/hr at 05/10/21 0323 Infusion Verify at 05/10/21 0323   lipase/protease/amylase (CREON) capsule 72,000 Units  72,000 Units Oral TID AC Chotiner, Yevonne Aline, MD       losartan (COZAAR) tablet 12.5 mg  12.5 mg Oral Daily Chotiner, Yevonne Aline, MD       pantoprazole (PROTONIX) EC tablet 40 mg  40 mg Oral BID Chotiner, Yevonne Aline, MD  Allergies as of 05/09/2021 - Review Complete 05/09/2021  Allergen Reaction Noted   Carvedilol Other (See Comments) 03/04/2021    Family History  Problem Relation Age of Onset   Lupus Mother    CAD Brother    Hypertension Brother    Colon cancer Neg Hx    Esophageal cancer Neg Hx    Rectal cancer Neg Hx    Stomach cancer Neg Hx     Social History   Socioeconomic History   Marital status: Married    Spouse name: Not on file   Number of children: Not on file   Years of education: Not on file    Highest education level: Not on file  Occupational History   Not on file  Tobacco Use   Smoking status: Former    Packs/day: 1.00    Years: 57.00    Pack years: 57.00    Types: Cigarettes    Start date: 09/12/1958    Quit date: 04/12/2016    Years since quitting: 5.0   Smokeless tobacco: Never  Vaping Use   Vaping Use: Never used  Substance and Sexual Activity   Alcohol use: No   Drug use: No   Sexual activity: Yes  Other Topics Concern   Not on file  Social History Narrative   Not on file   Social Determinants of Health   Financial Resource Strain: Not on file  Food Insecurity: No Food Insecurity   Worried About Running Out of Food in the Last Year: Never true   [place] in the Last Year: Never true  Transportation Needs: No Transportation Needs   Lack of Transportation (Medical): No   Lack of Transportation (Non-Medical): No  Physical Activity: Not on file  Stress: Not on file  Social Connections: Not on file  Intimate Partner Violence: Not on file    Review of Systems: All systems reviewed and negative except where noted in HPI.     OBJECTIVE    Physical Exam: Vital signs in last 24 hours: Temp:  [97.3 F (36.3 C)-98.5 F (36.9 C)] 97.9 F (36.6 C) (08/29 6144) Pulse Rate:  [68-88] 72 (08/29 0638) Resp:  [12-22] 12 (08/29 0800) BP: (100-160)/(58-82) 142/68 (08/29 0638) SpO2:  [98 %-100 %] 100 % (08/29 3154) Weight:  [52 kg] 52 kg (08/28 1539) Last BM Date: 05/10/21 General:   Alert  markedly thin male in NAD Psych:  Pleasant, cooperative. Normal mood and affect. Eyes:  Pupils equal, sclera clear, no icterus.   Conjunctiva pink. Ears:  Normal auditory acuity. Nose:  No deformity, discharge,  or lesions. Neck:  Supple; no masses Lungs:  Clear throughout to auscultation.   No wheezes, crackles, or rhonchi.  Heart:  Regular rate   no lower extremity edema Abdomen:  Soft, non-distended, mild RLQ tenderness.  BS active, no palp mass   Rectal:   Small scab over coccyx. Small mildly inflammed protruding hemorrhoid. No stool or blood in vault. Nontender on DRE  Msk:  Symmetrical without gross deformities. . Neurologic:  Alert and  oriented x4;  grossly normal neurologically. Skin:  Intact without significant lesions or rashes.   Scheduled inpatient medications  atorvastatin  20 mg Oral Daily   diphenhydrAMINE  25 mg Oral Once   insulin aspart  0-5 Units Subcutaneous QHS   insulin aspart  0-9 Units Subcutaneous TID WC   insulin glargine  8 Units Subcutaneous Daily   lipase/protease/amylase  72,000 Units Oral TID AC   losartan  12.5 mg Oral Daily   pantoprazole  40 mg Oral BID      Intake/Output from previous day: 08/28 0701 - 08/29 0700 In: 866.4 [I.V.:498.9; Blood:367.5] Out: 150 [Urine:150] Intake/Output this shift: No intake/output data recorded.   Lab Results: Recent Labs    05/09/21 1630 05/09/21 2236 05/10/21 0408  WBC 9.0  --  6.8  HGB 7.3* 7.8* 8.2*  HCT 23.4* 23.6* 24.2*  PLT 192  --  146*   BMET Recent Labs    05/09/21 1630 05/10/21 0408  NA 131* 132*  K 4.2 4.0  CL 99 100  CO2 25 27  GLUCOSE 149* 158*  BUN 24* 20  CREATININE 1.25* 1.25*  CALCIUM 8.5* 8.4*   LFT Recent Labs    05/09/21 1630  PROT 5.9*  ALBUMIN 2.9*  AST 20  ALT 17  ALKPHOS 187*  BILITOT 0.5   PT/INR Recent Labs    05/09/21 1630  LABPROT 14.5  INR 1.1   Hepatitis Panel No results for input(s): HEPBSAG, HCVAB, HEPAIGM, HEPBIGM in the last 72 hours.   . CBC Latest Ref Rng & Units 05/10/2021 05/09/2021 05/09/2021  WBC 4.0 - 10.5 K/uL 6.8 - 9.0  Hemoglobin 13.0 - 17.0 g/dL 8.2(L) 7.8(L) 7.3(L)  Hematocrit 39.0 - 52.0 % 24.2(L) 23.6(L) 23.4(L)  Platelets 150 - 400 K/uL 146(L) - 192    . CMP Latest Ref Rng & Units 05/10/2021 05/09/2021 05/03/2021  Glucose 70 - 99 mg/dL 158(H) 149(H) 143(H)  BUN 8 - 23 mg/dL 20 24(H) 16  Creatinine 0.61 - 1.24 mg/dL 1.25(H) 1.25(H) 1.19  Sodium 135 - 145 mmol/L 132(L) 131(L)  130(L)  Potassium 3.5 - 5.1 mmol/L 4.0 4.2 3.9  Chloride 98 - 111 mmol/L 100 99 102  CO2 22 - 32 mmol/L 27 25 22   Calcium 8.9 - 10.3 mg/dL 8.4(L) 8.5(L) 8.3(L)  Total Protein 6.5 - 8.1 g/dL - 5.9(L) -  Total Bilirubin 0.3 - 1.2 mg/dL - 0.5 -  Alkaline Phos 38 - 126 U/L - 187(H) -  AST 15 - 41 U/L - 20 -  ALT 0 - 44 U/L - 17 -   Studies/Results: No results found.  Principal Problem:   Lower GI bleed Active Problems:   Cancer of head of pancreas ([place]) s/p Whipple 02/2018   Essential hypertension, benign   CAD (coronary artery disease)   Insulin dependent type 2 diabetes mellitus ([place])   Hyponatremia   Renal cell carcinoma of right kidney ([facility]) s/p nephrectomy 02/2018   Symptomatic anemia   AKI (acute kidney injury) ([place])   Prolonged QT interval Acute blood loss anemia   Tye Savoy, NP-C @  05/10/2021, 9:42 AM  I have reviewed the entire case in detail with the above APP and discussed the plan in detail.  Therefore, I agree with the diagnoses recorded above. In addition,  I have personally interviewed and examined the patient.  My additional thoughts are as follows:  Recurrent GI bleeding and medically complex patient with prior Whipple resection for pancreatic cancer and recently discovered gastrojejunal anastomotic ulcer bleed treated with endoscopic therapy. Although the reported bleeding starting 3 days ago may have been more bright red blood in the maroon stool he apparently had before, a recurrent bleed from the ulcer is still the first thing to rule out before evaluating his lower digestive tract.  BUN mildly elevated, in similar fashion to the recent upper GI bleed.  He received 1 unit PRBC transfusion, is hemodynamically stable and I feel the  next best test is an upper endoscopy tomorrow.  He was agreeable after thorough discussion procedure and risks.  The benefits and risks of the planned procedure were described in detail with the patient or (when appropriate) their  health care proxy.  Risks were outlined as including, but not limited to, bleeding, infection, perforation, adverse medication reaction leading to cardiac or pulmonary decompensation, pancreatitis (if ERCP).  The limitation of incomplete mucosal visualization was also discussed.  No guarantees or warranties were given.  Patient at increased risk for cardiopulmonary complications of procedure due to medical comorbidities.  Although he is hard of hearing, we were able to communicate effectively.  Even still, he asked [place] to contact his wife with an update on our plan, which we will gladly do.  Oral Protonix will be changed to 40 mg IV twice daily  Nelida Meuse III Office:[phone]

## 2021-05-10 NOTE — Progress Notes (Signed)
   05/10/21 1100  Therapy Vitals  Resp 16  Mobility  Activity Sat and stood x 3  Level of Assistance Standby assist, set-up cues, supervision of patient - no hands on  Assistive Device None  Distance Ambulated (ft) 0 ft  Mobility  (Exercises)  Mobility Response Tolerated well  Mobility performed by Mobility specialist  $Mobility charge 1 Mobility   Upon entering, pt was reluctant to ambulate in the hall. Agreed to do exercises in the room. Pt completed 3 rounds of 5 stand to sit exercises. Pt rested in between each round. No complaints of SOB, lightheadedness, or any other symptoms at this time. Pt does have hearing aids, but is still HOH. Left pt in bed with call bell at side and wife present.    [place] Specialist Acute Rehab Services Office: [phone]

## 2021-05-10 NOTE — Consult Note (Addendum)
Referring Provider:  [facility]         Primary Care Physician:  [name], MD Primary Gastroenterologist:  Oretha Caprice, MD       Reason for Consultation:   GI bleed                ASSESSMENT / PLAN   # 75 yo male with history of pancreatic adenocarcinoma. 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  Adjuvant chemotherapy completed 06/2018. Followed by Dr. Benay Spice.  # Painless rectal bleeding. Admitted several days ago with upper GI bleed in form of maroon stool (on Plavix) secondary to anastomotic bleed, s/p Epi injection and clip placement with control on bleeding. Discharged home on off Plavix on 8/22. Now admitted with rectal bleeding ( episode 8/27 and yesterday am 8/28). No blood in stool today. Cannot exclude recurrent upper GI bleed ( brisk bleed as patient's wife reports decline in BP at time of bleeding) but seem less likely given recent endoscopic therapy and he has not resumed Plavix. [name] BUN was minimally elevated but creatinine also up. Need to consider lower source of bleeding such as hemorrhoids.  --No active bleeding today and DRE negative for blood --Empirically treat for internal hemorrhoid bleeding --At some point he should have a colonoscopy, I'm just not sure he is physically strong enough for it right now.  --Continue BID PPI --Clear liquids okay for now  # Acute on chronic anemia. His hgb in ED yesterday was 7.3, down from 8.5 at time of recent hospital discharge. Hgb improved to 8.7 post 1 uPRBC   # Prolonged QT interval   # Chronic diarrhea post Whipple. Takes Creon and uses Lomotil as needed.  --Continue home Creon when eating again.  --Sounds like he still has significant diarrhea despite creon and as needed Lomotil. Can treat diarrhea more aggressively in outpatient  setting. We can give him scheduled lomotil BID and increase to TID or add imodium if needed.    # Probable malnutrition. Muscle wasting, Albumin 2.8.   # AKI    HISTORY OF PRESENT ILLNESS                                                                                                                         Chief Complaint: rectal bleeding  JAMAREON SHIMEL is a 75 y.o. male with a past medical history significant for pancreatic adenocarcinoma ( node, GI bleed from [place] anastomotic ulcer in 2021, CAD / STEMI s/p DES in 2020, HTN, hyperlipidemia, CVA, severe AS s/p TAVR in 2021, DM, RCC, COVID infection   02/16/21 office visit for follow up on abdominal pain. EGD CT scan negative for a source of pain. Pain adhesion related? Though no sign of recent cancer it isn't impossible given node positive. KUB obtain >>negative.   8/15-8/22 Admission for upper GI bleed /  melena on Plavix. He required transfusion of 3 uPRBCs for hgb of 5.9.  Tagged RBC scan negative. Inpatient EGD  8/18 showed Whipple's anatomy, a gastric anastomotic ulcer which was actively bleeding. Area injected with epi and 6 hemostatic clips were place with successful hemostasis. At time of discharge patient's hgb was up 8.5.   05/09/21 Patient presented to ED yesterday with weakness / hypotension. Wife noted red blood in stool. Plavix has been on hold since last admission. In ED his hgb 2was 7.3. FOBT +. Wife helps with history. Patient left the hospital last Monday. He felt great until Saturday when he had an episode of diarrhea with bright red blood. The following morning he had another episodes . He became weak and dizzy and wife says his blood pressure started falling ( systolic was ~ 793 at lowest point). No abdominal pain or rectal pain. No further bleeding yesterday afternoon and this am he no blood in stool. He has chronic diarrhea present since Whipple.   PREVIOUS ENDOSCOPIC EVALUATIONS    04/29/21 EGD for GI  bleed Findings: Esophagus normal. Whipple's anatomy. One linear anastomotic gastric ulcer with spurting hemorrhage (Forrest Class Ia) was found at the anastomosis. The lesion was 8-10 mm in largest dimension. Area was successfully injected with 6 mL of a 1:10,000 solution of epinephrine for drug delivery. To stop active bleeding, six hemostatic clips were successfully placed (MR conditional). 3 misfired. There was no bleeding at the end of the procedure. 3 previous clips were noted at anastomosis away from the above ulcer. The afferent and efferent limbs were normal.  12/16/20 EGD Typical post Whipple anatomy. 3 endoclips remain adherent to the GE anastomosis. No sign of ulcer or obvious foreign body reaction at the site. The mucosa at the anastomosis was fairly normal appearing, one region was a bit more edematous that the rest and I biopsied this with forceps (not overtly neoplastic appearing). - Afferent and efferent limbs were normal appearing. - The examination was otherwise normal  Surgical [P], abnormal mucosa at gastric whipple anastomosis - HYPERPLASTIC GASTRIC MUCOSA AND UNREMARKABLE DUODENAL MUCOSA CONSISTENT WITH ANASTOMOSIS SITE. NO MALIGNANCY IDENTIFIED.  Past Medical History:  Diagnosis Date   Arthritis    back   Blood transfusion without reported diagnosis    CAD (coronary artery disease)    a. 11/2016 in [place]: STEMI s/p DES to mid LAD, DES to diagonal, DES x 2 to proximal and mid RCA b. 08/2019: NSTEMI with DES to distal RCA   Essential hypertension    Full dentures    GERD (gastroesophageal reflux disease)    History of stroke    Hyperlipidemia    Myocardial infarction [facility])    Pancreatic cancer ([place])    Pneumonia    Renal cell carcinoma ([place])    S/P TAVR (transcatheter aortic valve replacement)    a. 08/2017:  Edwards Sapien 3 THV (size 26 mm, model #9600CM26A , serial # [id number])   Severe aortic stenosis    a. 08/2017: s/p TAVR by Dr. Angelena Form and Dr. Cyndia Bent.  2021 NO AVS on echo   Type 2 diabetes mellitus ([place])    Wears glasses    Wears hearing aid     Past Surgical History:  Procedure Laterality Date   APPENDECTOMY     CARDIAC CATHETERIZATION     CORONARY ANGIOGRAPHY N/A 08/23/2019   Procedure: CORONARY ANGIOGRAPHY;  Surgeon: Belva Crome, MD;  Location: [place] [facility];  Service: Cardiovascular;  Laterality: N/A;   CORONARY STENT  INTERVENTION N/A 08/23/2019   Procedure: CORONARY STENT INTERVENTION;  Surgeon: Belva Crome, MD;  Location: [place] [facility];  Service: Cardiovascular;  Laterality: N/A;   CORONARY STENT PLACEMENT     x 4 in March 2018   ESOPHAGOGASTRODUODENOSCOPY (EGD) WITH PROPOFOL N/A 01/02/2020   Procedure: ESOPHAGOGASTRODUODENOSCOPY (EGD) WITH PROPOFOL;  Surgeon: Jackquline Denmark, MD;  Location: [facility];  Service: Endoscopy;  Laterality: N/A;   ESOPHAGOGASTRODUODENOSCOPY (EGD) WITH PROPOFOL N/A 04/29/2021   Procedure: ESOPHAGOGASTRODUODENOSCOPY (EGD) WITH PROPOFOL;  Surgeon: Jackquline Denmark, MD;  Location: [facility];  Service: Endoscopy;  Laterality: N/A;   EUS N/A 08/02/2017   Procedure: UPPER ENDOSCOPIC ULTRASOUND (EUS) RADIAL;  Surgeon: [name], MD;  [facility]: [facility];  Service: Endoscopy;  Laterality: N/A;   EYE SURGERY Left    HAND SURGERY Bilateral    HEMOSTASIS CLIP PLACEMENT  01/02/2020   Procedure: HEMOSTASIS CLIP PLACEMENT;  Surgeon: Jackquline Denmark, MD;  [facility];  Service: Endoscopy;;   HEMOSTASIS CLIP PLACEMENT  04/29/2021   Procedure: HEMOSTASIS CLIP PLACEMENT;  Surgeon: Jackquline Denmark, MD;  [facility];  Service: Endoscopy;;   HERNIA REPAIR Right    HOT HEMOSTASIS N/A 01/02/2020   Procedure: HOT HEMOSTASIS (ARGON PLASMA COAGULATION/BICAP);  Surgeon: Jackquline Denmark, MD;  Location: [facility];  Service: Endoscopy;  Laterality: N/A;   LAPAROSCOPY N/A 02/19/2018   Procedure: DIAGNOSTIC LAPAROSCOPY, ERAS PATHWAY;  Surgeon: [name], MD;  Location: [place];  Service:  General;  Laterality: N/A;   MULTIPLE TOOTH EXTRACTIONS     NEPHRECTOMY Left 02/19/2018   Procedure: OPEN LEFT RADICAL NEPHRECTOMY;  Surgeon: Cleon Gustin, MD;  Location: [place];  Service: Urology;  Laterality: Left;   PORT-A-CATH REMOVAL N/A 06/13/2019   Procedure: PORT REMOVAL;  Surgeon: [name], MD;  Location: [place];  Service: General;  Laterality: N/A;   PORTACATH PLACEMENT N/A 09/15/2017   Procedure: INSERTION PORT-A-CATH;  Surgeon: [name], MD;  Location: [place];  Service: General;  Laterality: N/A;   RIGHT/LEFT HEART CATH AND CORONARY ANGIOGRAPHY N/A 07/05/2017   Procedure: RIGHT/LEFT HEART CATH AND CORONARY ANGIOGRAPHY;  Surgeon: [name], MD;  Location: [place] [facility];  Service: Cardiovascular;  Laterality: N/A;   SCLEROTHERAPY  01/02/2020   Procedure: SCLEROTHERAPY;  Surgeon: Jackquline Denmark, MD;  Location: [facility];  Service: Endoscopy;;   SCLEROTHERAPY  04/29/2021   Procedure: Clide Deutscher;  Surgeon: Jackquline Denmark, MD;  [facility];  Service: Endoscopy;;   SHOULDER ARTHROSCOPY WITH ROTATOR CUFF REPAIR Left    TEE WITHOUT CARDIOVERSION N/A 08/22/2017   Procedure: TRANSESOPHAGEAL ECHOCARDIOGRAM (TEE);  Surgeon: Burnell Blanks, MD;  Location: [place];  Service: Open Heart Surgery;  Laterality: N/A;   TRANSCATHETER AORTIC VALVE REPLACEMENT, TRANSFEMORAL N/A 08/22/2017   Procedure: TRANSCATHETER AORTIC VALVE REPLACEMENT, TRANSFEMORAL;  Surgeon: Burnell Blanks, MD;  Location: [place];  Service: Open Heart Surgery;  Laterality: N/A;   UPPER GASTROINTESTINAL ENDOSCOPY     WHIPPLE PROCEDURE N/A 02/19/2018   Procedure: WHIPPLE PROCEDURE;  Surgeon: [name], MD;  Location: [place];  Service: General;  Laterality: N/A;    Prior to Admission medications   Medication Sig Start Date End Date Taking? Authorizing Provider  acetaminophen (TYLENOL) 325 MG tablet Take 325-650 mg by mouth every 6 (six) hours as needed for mild  pain.   Yes [provider]  diphenoxylate-atropine (LOMOTIL) 2.5-0.025 MG tablet Take 1 tablet by mouth 4 (four) times daily as needed for diarrhea or loose stools. 03/08/21  Yes Ghimire, Henreitta Leber, MD  feeding supplement (ENSURE ENLIVE / ENSURE PLUS) LIQD Take 237 mLs by mouth 2 (two) times daily between meals. Patient taking differently: Take 237 mLs by mouth 2 (two) times daily as needed (feeding suppliment). 05/03/21  Yes Eugenie Filler, MD  Insulin Glargine [facility]) 100 UNIT/ML Inject 8 Units into the skin daily.   Yes [provider]  insulin lispro (HUMALOG KWIKPEN) 100 UNIT/ML KwikPen Inject 5-8 Units into the skin 3 (three) times daily before meals. Inject 5-8 units 3 times a day with meals. Patient taking differently: Inject 2-3 Units into the skin 3 (three) times daily before meals. Sliding scale 12/04/20  Yes Philemon Kingdom, MD  lipase/protease/amylase (CREON) 36000 UNITS CPEP capsule Take 2 capsules (72,000 Units total) by mouth 3 (three) times daily before meals. Pt taking 2 tab before meals. 05/03/21  Yes Eugenie Filler, MD  losartan (COZAAR) 25 MG tablet Take 0.5 tablets (12.5 mg total) by mouth daily. 02/18/21  Yes Strader, [place] M, PA-C  pantoprazole (PROTONIX) 40 MG tablet Take 1 tablet (40 mg total) by mouth 2 (two) times daily. 05/03/21  Yes Eugenie Filler, MD  atorvastatin (LIPITOR) 20 MG tablet Take 1 tablet (20 mg total) by mouth daily. 05/10/21   Eugenie Filler, MD  Continuous Blood Gluc Sensor (FREESTYLE LIBRE 14 DAY SENSOR) MISC Inject 1 patch into the skin every 14 (fourteen) days.    [provider]  Insulin Pen Needle (PEN NEEDLES) 31G X 6 MM MISC Use as directed to check blood sugar 4 times a day. 11/10/20   Philemon Kingdom, MD  Multiple Vitamin (MULTIVITAMIN WITH MINERALS) TABS tablet Take 1 tablet by mouth daily. 05/04/21   Eugenie Filler, MD  nitroGLYCERIN (NITROSTAT) 0.4 MG SL tablet Place 1 tablet (0.4 mg total)  under the tongue every 5 (five) minutes x 3 doses as needed for chest pain (if no relief after 2nd dose, proceed to the ED for an evaluation or call 911). 01/15/21   Satira Sark, MD    Current Facility-Administered Medications  Medication Dose Route Frequency Provider Last Rate Last Admin   acetaminophen (TYLENOL) tablet 650 mg  650 mg Oral Q6H PRN Chotiner, Yevonne Aline, MD       Or   acetaminophen (TYLENOL) suppository 650 mg  650 mg Rectal Q6H PRN Chotiner, Yevonne Aline, MD       atorvastatin (LIPITOR) tablet 20 mg  20 mg Oral Daily Chotiner, Yevonne Aline, MD       diphenhydrAMINE (BENADRYL) capsule 25 mg  25 mg Oral Once Chotiner, Yevonne Aline, MD       insulin aspart (novoLOG) injection 0-5 Units  0-5 Units Subcutaneous QHS Chotiner, Yevonne Aline, MD       insulin aspart (novoLOG) injection 0-9 Units  0-9 Units Subcutaneous TID WC Chotiner, Yevonne Aline, MD   1 Units at 05/10/21 0836   insulin glargine (LANTUS) injection 8 Units  8 Units Subcutaneous Daily Chotiner, Yevonne Aline, MD       lactated ringers infusion   Intravenous Continuous Chotiner, Yevonne Aline, MD 75 mL/hr at 05/10/21 0323 Infusion Verify at 05/10/21 0323   lipase/protease/amylase (CREON) capsule 72,000 Units  72,000 Units Oral TID AC Chotiner, Yevonne Aline, MD       losartan (COZAAR) tablet 12.5 mg  12.5 mg Oral Daily Chotiner, Yevonne Aline, MD       pantoprazole (PROTONIX) EC tablet 40 mg  40 mg Oral BID Chotiner, Yevonne Aline, MD  Allergies as of 05/09/2021 - Review Complete 05/09/2021  Allergen Reaction Noted   Carvedilol Other (See Comments) 03/04/2021    Family History  Problem Relation Age of Onset   Lupus Mother    CAD Brother    Hypertension Brother    Colon cancer Neg Hx    Esophageal cancer Neg Hx    Rectal cancer Neg Hx    Stomach cancer Neg Hx     Social History   Socioeconomic History   Marital status: Married    Spouse name: Not on file   Number of children: Not on file   Years of education: Not on file    Highest education level: Not on file  Occupational History   Not on file  Tobacco Use   Smoking status: Former    Packs/day: 1.00    Years: 57.00    Pack years: 57.00    Types: Cigarettes    Start date: 09/12/1958    Quit date: 04/12/2016    Years since quitting: 5.0   Smokeless tobacco: Never  Vaping Use   Vaping Use: Never used  Substance and Sexual Activity   Alcohol use: No   Drug use: No   Sexual activity: Yes  Other Topics Concern   Not on file  Social History Narrative   Not on file   Social Determinants of Health   Financial Resource Strain: Not on file  Food Insecurity: No Food Insecurity   Worried About Running Out of Food in the Last Year: Never true   [place] in the Last Year: Never true  Transportation Needs: No Transportation Needs   Lack of Transportation (Medical): No   Lack of Transportation (Non-Medical): No  Physical Activity: Not on file  Stress: Not on file  Social Connections: Not on file  Intimate Partner Violence: Not on file    Review of Systems: All systems reviewed and negative except where noted in HPI.     OBJECTIVE    Physical Exam: Vital signs in last 24 hours: Temp:  [97.3 F (36.3 C)-98.5 F (36.9 C)] 97.9 F (36.6 C) (08/29 6803) Pulse Rate:  [68-88] 72 (08/29 0638) Resp:  [12-22] 12 (08/29 0800) BP: (100-160)/(58-82) 142/68 (08/29 0638) SpO2:  [98 %-100 %] 100 % (08/29 2122) Weight:  [52 kg] 52 kg (08/28 1539) Last BM Date: 05/10/21 General:   Alert  markedly thin male in NAD Psych:  Pleasant, cooperative. Normal mood and affect. Eyes:  Pupils equal, sclera clear, no icterus.   Conjunctiva pink. Ears:  Normal auditory acuity. Nose:  No deformity, discharge,  or lesions. Neck:  Supple; no masses Lungs:  Clear throughout to auscultation.   No wheezes, crackles, or rhonchi.  Heart:  Regular rate   no lower extremity edema Abdomen:  Soft, non-distended, mild RLQ tenderness.  BS active, no palp mass   Rectal:   Small scab over coccyx. Small mildly inflammed protruding hemorrhoid. No stool or blood in vault. Nontender on DRE  Msk:  Symmetrical without gross deformities. . Neurologic:  Alert and  oriented x4;  grossly normal neurologically. Skin:  Intact without significant lesions or rashes.   Scheduled inpatient medications  atorvastatin  20 mg Oral Daily   diphenhydrAMINE  25 mg Oral Once   insulin aspart  0-5 Units Subcutaneous QHS   insulin aspart  0-9 Units Subcutaneous TID WC   insulin glargine  8 Units Subcutaneous Daily   lipase/protease/amylase  72,000 Units Oral TID AC   losartan  12.5 mg Oral Daily   pantoprazole  40 mg Oral BID      Intake/Output from previous day: 08/28 0701 - 08/29 0700 In: 866.4 [I.V.:498.9; Blood:367.5] Out: 150 [Urine:150] Intake/Output this shift: No intake/output data recorded.   Lab Results: Recent Labs    05/09/21 1630 05/09/21 2236 05/10/21 0408  WBC 9.0  --  6.8  HGB 7.3* 7.8* 8.2*  HCT 23.4* 23.6* 24.2*  PLT 192  --  146*   BMET Recent Labs    05/09/21 1630 05/10/21 0408  NA 131* 132*  K 4.2 4.0  CL 99 100  CO2 25 27  GLUCOSE 149* 158*  BUN 24* 20  CREATININE 1.25* 1.25*  CALCIUM 8.5* 8.4*   LFT Recent Labs    05/09/21 1630  PROT 5.9*  ALBUMIN 2.9*  AST 20  ALT 17  ALKPHOS 187*  BILITOT 0.5   PT/INR Recent Labs    05/09/21 1630  LABPROT 14.5  INR 1.1   Hepatitis Panel No results for input(s): HEPBSAG, HCVAB, HEPAIGM, HEPBIGM in the last 72 hours.   . CBC Latest Ref Rng & Units 05/10/2021 05/09/2021 05/09/2021  WBC 4.0 - 10.5 K/uL 6.8 - 9.0  Hemoglobin 13.0 - 17.0 g/dL 8.2(L) 7.8(L) 7.3(L)  Hematocrit 39.0 - 52.0 % 24.2(L) 23.6(L) 23.4(L)  Platelets 150 - 400 K/uL 146(L) - 192    . CMP Latest Ref Rng & Units 05/10/2021 05/09/2021 05/03/2021  Glucose 70 - 99 mg/dL 158(H) 149(H) 143(H)  BUN 8 - 23 mg/dL 20 24(H) 16  Creatinine 0.61 - 1.24 mg/dL 1.25(H) 1.25(H) 1.19  Sodium 135 - 145 mmol/L 132(L) 131(L)  130(L)  Potassium 3.5 - 5.1 mmol/L 4.0 4.2 3.9  Chloride 98 - 111 mmol/L 100 99 102  CO2 22 - 32 mmol/L 27 25 22   Calcium 8.9 - 10.3 mg/dL 8.4(L) 8.5(L) 8.3(L)  Total Protein 6.5 - 8.1 g/dL - 5.9(L) -  Total Bilirubin 0.3 - 1.2 mg/dL - 0.5 -  Alkaline Phos 38 - 126 U/L - 187(H) -  AST 15 - 41 U/L - 20 -  ALT 0 - 44 U/L - 17 -   Studies/Results: No results found.  Principal Problem:   Lower GI bleed Active Problems:   Cancer of head of pancreas ([place]) s/p Whipple 02/2018   Essential hypertension, benign   CAD (coronary artery disease)   Insulin dependent type 2 diabetes mellitus ([place])   Hyponatremia   Renal cell carcinoma of right kidney ([facility]) s/p nephrectomy 02/2018   Symptomatic anemia   AKI (acute kidney injury) ([place])   Prolonged QT interval Acute blood loss anemia   Tye Savoy, NP-C @  05/10/2021, 9:42 AM  I have reviewed the entire case in detail with the above APP and discussed the plan in detail.  Therefore, I agree with the diagnoses recorded above. In addition,  I have personally interviewed and examined the patient.  My additional thoughts are as follows:  Recurrent GI bleeding and medically complex patient with prior Whipple resection for pancreatic cancer and recently discovered gastrojejunal anastomotic ulcer bleed treated with endoscopic therapy. Although the reported bleeding starting 3 days ago may have been more bright red blood in the maroon stool he apparently had before, a recurrent bleed from the ulcer is still the first thing to rule out before evaluating his lower digestive tract.  BUN mildly elevated, in similar fashion to the recent upper GI bleed.  He received 1 unit PRBC transfusion, is hemodynamically stable and I feel the  next best test is an upper endoscopy tomorrow.  He was agreeable after thorough discussion procedure and risks.  The benefits and risks of the planned procedure were described in detail with the patient or (when appropriate) their  health care proxy.  Risks were outlined as including, but not limited to, bleeding, infection, perforation, adverse medication reaction leading to cardiac or pulmonary decompensation, pancreatitis (if ERCP).  The limitation of incomplete mucosal visualization was also discussed.  No guarantees or warranties were given.  Patient at increased risk for cardiopulmonary complications of procedure due to medical comorbidities.  Although he is hard of hearing, we were able to communicate effectively.  Even still, he asked [place] to contact his wife with an update on our plan, which we will gladly do.  Oral Protonix will be changed to 40 mg IV twice daily  Nelida Meuse III [phone]

## 2021-05-11 ENCOUNTER — Inpatient Hospital Stay (HOSPITAL_COMMUNITY): Payer: Medicare Other | Admitting: Anesthesiology

## 2021-05-11 ENCOUNTER — Encounter (HOSPITAL_COMMUNITY)
Admission: EM | Disposition: A | Discharge status: Home / Self Care | Payer: Self-pay | Source: Home / Self Care | Attending: Internal Medicine

## 2021-05-11 DIAGNOSIS — K9081 Whipple's disease: Secondary | ICD-10-CM

## 2021-05-11 HISTORY — PX: ESOPHAGOGASTRODUODENOSCOPY (EGD) WITH PROPOFOL: SHX5813

## 2021-05-11 HISTORY — PX: HOT HEMOSTASIS: SHX5433

## 2021-05-11 HISTORY — PX: BIOPSY: SHX5522

## 2021-05-11 LAB — BASIC METABOLIC PANEL
Anion gap: 7 (ref 5–15)
BUN: 13 mg/dL (ref 8–23)
CO2: 26 mmol/L (ref 22–32)
Calcium: 8.9 mg/dL (ref 8.9–10.3)
Chloride: 97 mmol/L — ABNORMAL LOW (ref 98–111)
Creatinine, Ser: 1.07 mg/dL (ref 0.61–1.24)
GFR, Estimated: >60 mL/min (ref >60)
Glucose, Bld: 81 mg/dL (ref 70–99)
Potassium: 4 mmol/L (ref 3.5–5.1)
Sodium: 130 mmol/L — ABNORMAL LOW (ref 135–145)

## 2021-05-11 LAB — TYPE AND SCREEN
ABO/RH(D): A POS
Antibody Screen: NEGATIVE
Unit division: 0

## 2021-05-11 LAB — GLUCOSE, CAPILLARY
Glucose-Capillary: 93 mg/dL (ref 70–99)
Glucose-Capillary: 103 mg/dL — ABNORMAL HIGH (ref 70–99)
Glucose-Capillary: 116 mg/dL — ABNORMAL HIGH (ref 70–99)
Glucose-Capillary: 170 mg/dL — ABNORMAL HIGH (ref 70–99)

## 2021-05-11 LAB — BPAM RBC
Blood Product Expiration Date: 202209192359
ISSUE DATE / TIME: 202208290006
Unit Type and Rh: 6200

## 2021-05-11 LAB — CBC
HCT: 30.3 % — ABNORMAL LOW (ref 39.0–52.0)
Hemoglobin: 10.1 g/dL — ABNORMAL LOW (ref 13.0–17.0)
MCH: 30.3 pg (ref 26.0–34.0)
MCHC: 33.3 g/dL (ref 30.0–36.0)
MCV: 91 fL (ref 80.0–100.0)
Platelets: 157 10*3/uL (ref 150–400)
RBC: 3.33 MIL/uL — ABNORMAL LOW (ref 4.22–5.81)
RDW: 16.4 % — ABNORMAL HIGH (ref 11.5–15.5)
WBC: 11.9 10*3/uL — ABNORMAL HIGH (ref 4.0–10.5)
nRBC: 0 % (ref 0.0–0.2)

## 2021-05-11 SURGERY — ESOPHAGOGASTRODUODENOSCOPY (EGD) WITH PROPOFOL
Anesthesia: Monitor Anesthesia Care

## 2021-05-11 MED ORDER — PROPOFOL 10 MG/ML IV BOLUS
INTRAVENOUS | Status: DC | PRN
  Administered 2021-05-11 (×2): 20 mg via INTRAVENOUS

## 2021-05-11 MED ORDER — PROPOFOL 500 MG/50ML IV EMUL
INTRAVENOUS | Status: AC
  Filled 2021-05-11: qty 50

## 2021-05-11 MED ORDER — SUCRALFATE 1 GM/10ML PO SUSP
1.0000 g | Freq: Three times a day (TID) | ORAL | Status: DC
  Administered 2021-05-11 – 2021-05-12 (×4): 1 g via ORAL
  Filled 2021-05-11 (×4): qty 10

## 2021-05-11 MED ORDER — PROPOFOL 500 MG/50ML IV EMUL
INTRAVENOUS | Status: DC | PRN
  Administered 2021-05-11: 125 ug/kg/min via INTRAVENOUS

## 2021-05-11 MED ORDER — LIDOCAINE 2% (20 MG/ML) 5 ML SYRINGE
INTRAMUSCULAR | Status: DC | PRN
  Administered 2021-05-11: 100 mg via INTRAVENOUS

## 2021-05-11 SURGICAL SUPPLY — 15 items

## 2021-05-11 NOTE — Transfer of Care (Signed)
Immediate Anesthesia Transfer of Care Note  Patient: Manuel Olson  Procedure(s) Performed: ESOPHAGOGASTRODUODENOSCOPY (EGD) WITH PROPOFOL HOT HEMOSTASIS (ARGON PLASMA COAGULATION/BICAP) BIOPSY  Patient Location: Endoscopy Unit  Anesthesia Type:MAC  Level of Consciousness: drowsy  Airway & Oxygen Therapy: Patient Spontanous Breathing and Patient connected to face mask oxygen  Post-op Assessment: Report given to RN and Post -op Vital signs reviewed and stable  Post vital signs: Reviewed and stable  Last Vitals:  Vitals Value Taken Time  BP 143/91 05/11/21 1414  Temp    Pulse    Resp 14 05/11/21 1415  SpO2    Vitals shown include unvalidated device data.  Last Pain:  Vitals:   05/11/21 1237  TempSrc: Oral  PainSc: 0-No pain         Complications: No notable events documented.

## 2021-05-11 NOTE — Interval H&P Note (Signed)
History and Physical Interval Note:  05/11/2021 1:20 PM  Manuel Olson  has presented today for surgery, with the diagnosis of gastrointestinal bleeding.  The various methods of treatment have been discussed with the patient and family. After consideration of risks, benefits and other options for treatment, the patient has consented to  Procedure(s): ESOPHAGOGASTRODUODENOSCOPY (EGD) WITH PROPOFOL (N/A) as a surgical intervention.  The patient's history has been reviewed, patient examined, no change in status, stable for surgery.  I have reviewed the patient's chart and labs.  Questions were answered to the patient's satisfaction.    Hgb 10.1 today  Nelida Meuse III

## 2021-05-11 NOTE — Anesthesia Preprocedure Evaluation (Addendum)
Anesthesia Evaluation  Patient identified by MRN, date of birth, ID band Patient awake    Reviewed: Allergy & Precautions, NPO status , Patient's Chart, lab work & pertinent test results  Airway Mallampati: II  TM Distance: >3 FB Neck ROM: Full    Dental  (+) Edentulous Upper, Edentulous Lower   Pulmonary former smoker,    Pulmonary exam normal breath sounds clear to auscultation       Cardiovascular hypertension, Pt. on medications + CAD, + Past MI and + Cardiac Stents  Normal cardiovascular exam Rhythm:Regular Rate:Normal  ECG: rate 75   Neuro/Psych PSYCHIATRIC DISORDERS negative neurological ROS     GI/Hepatic negative GI ROS, Neg liver ROS,   Endo/Other  diabetes, Insulin Dependent  Renal/GU negative Renal ROS     Musculoskeletal  (+) Arthritis ,   Abdominal   Peds  Hematology  (+) anemia , HLD   Anesthesia Other Findings gastrointestinal bleeding  Reproductive/Obstetrics                            Anesthesia Physical Anesthesia Plan  ASA: 3  Anesthesia Plan: MAC   Post-op Pain Management:    Induction: Intravenous  PONV Risk Score and Plan: 1 and Propofol infusion and Treatment may vary due to age or medical condition  Airway Management Planned: Nasal Cannula  Additional Equipment:   Intra-op Plan:   Post-operative Plan:   Informed Consent: I have reviewed the patients History and Physical, chart, labs and discussed the procedure including the risks, benefits and alternatives for the proposed anesthesia with the patient or authorized representative who has indicated his/her understanding and acceptance.       Plan Discussed with: CRNA  Anesthesia Plan Comments:         Anesthesia Quick Evaluation

## 2021-05-11 NOTE — Progress Notes (Signed)
PROGRESS NOTE    Manuel Olson  UJW:[id number] DOB: 04-27-1946 DOA: 05/09/2021 PCP: [name], MD     Brief Narrative:  Manuel Olson is a 75 y.o. male with medical history significant for pancreatic cancer s/p whipple, also has had renal cell carcinoma s/p nephrectomy, s/p aortic replacement, CAD with stents, HTN, DMT2, anemia, PUD, HLD who was admitted 2 weeks ago with an upper GI bleed and had EGD with GI and clips& epinephrine placed at bleeding gastric vessel. Was taken off plavix on 8/16 and was not resumed at discharge. He did receive PRBC transfusion during that admission. He was discharged last week and was doing well until last night.  He reports that last night he had bright red blood in his stool which was very soft.  He complained of having some fatigue and has weakness last night.  This morning when he woke up he felt more fatigued and got dizzy if he would stand up.  He had another episode of dark red blood with bowel movement as well as some lower abdominal cramping.  Wife checked his blood pressure and it was low and brought him for evaluation.  Patient was admitted to the hospital and GI consulted.   New events last 24 hours / Subjective: No further bleeding reported. Awaiting EGD today.   Assessment & Plan:   Principal Problem:   Lower GI bleed Active Problems:   Cancer of head of pancreas [facility]) s/p Whipple 02/2018   Essential hypertension, benign   CAD (coronary artery disease)   Insulin dependent type 2 diabetes mellitus (HCC)   Hyponatremia   Renal cell carcinoma of right kidney (HCC) s/p nephrectomy 02/2018   Symptomatic anemia   AKI (acute kidney injury) ([place])   Prolonged QT interval   Recurrent GI bleed with symptomatic anemia -GI consulted -Transfused 1 unit packed red blood cell 8/29 -Trend CBC, stable  -Continue PPI -EGD 8/30   Hypertension -Continue Cozaar  HLD -Continue lipitor   Diabetes mellitus type 2 -Continue Lantus,  sliding scale insulin  CKD stage IIa -Baseline creatinine 1.1-1.2 -At baseline   CAD -Plavix has been discontinued since previous hospital stay  Cancer of head of pancreas [facility]) s/p Whipple 02/2018 Renal cell carcinoma of right kidney ([place]) s/p nephrectomy 02/2018  In agreement with assessment of the pressure ulcer as below:  Pressure Injury 04/27/21 Coccyx Mid;Upper Stage 1 -  Intact skin with non-blanchable redness of a localized area usually over a bony prominence. (Active)  04/27/21 0402  Location: Coccyx  Location Orientation: Mid;Upper  Staging: Stage 1 -  Intact skin with non-blanchable redness of a localized area usually over a bony prominence.  Wound Description (Comments):   Present on Admission: Yes         DVT prophylaxis:  SCDs Start: 05/09/21 2217  Code Status:     Code Status Orders  (From admission, onward)           Start     Ordered   05/09/21 2217  Full code  Continuous        05/09/21 2217           Code Status History     Date Active Date Inactive Code Status Order ID Comments User Context   04/26/2021 2038 05/03/2021 2149 Full Code [id number]  Rise Patience, MD ED   03/04/2021 2328 03/08/2021 1912 Full Code [id number]  Shela Leff, MD ED   12/30/2019 2349 01/05/2020 1509 Full Code [id number]  Gean Birchwood  Delane Ginger, MD ED   08/22/2019 2326 08/24/2019 1509 Full Code [id number]  Bethena Roys, MD Inpatient   04/03/2018 1713 04/05/2018 1754 Full Code [id number]  Kerney Elbe, DO Inpatient   02/27/2018 1730 03/03/2018 1350 Full Code [id number]  Cathlyn Parsons, PA-C Inpatient   02/27/2018 1730 02/27/2018 1730 Full Code [id number]  Cathlyn Parsons, PA-C Inpatient   02/19/2018 1634 02/27/2018 1720 Full Code [id number]  [name], MD Inpatient   09/26/2017 1751 10/02/2017 1739 Full Code [id number]  Phillips Grout, MD Inpatient   08/22/2017 1805 08/24/2017 1351 Full Code [id number]  Gaye Pollack, MD Inpatient   07/05/2017 0925  07/05/2017 1534 Full Code [id number]  [name], MD Inpatient      Advance Directive Documentation    Flowsheet Row Most Recent Value  Type of Advance Directive --  [Unknown]  Pre-existing out of facility DNR order (yellow form or pink MOST form) --  "MOST" Form in Place? --      Family Communication: Spouse at bedside Disposition Plan:  Status is: Inpatient  Remains inpatient appropriate because:Inpatient level of care appropriate due to severity of illness  Dispo: The patient is from: Home              Anticipated d/c is to: Home              Patient currently is not medically stable to d/c. EGD 8/30.    Difficult to place patient No      Consultants:  GI  Procedures:  EGD 8/30  Antimicrobials:  Anti-infectives (From admission, onward)    None        Objective: Vitals:   05/11/21 0533 05/11/21 1035 05/11/21 1237 05/11/21 1416  BP: 140/81 (!) 154/92 (!) 140/96 (!) 143/91  Pulse: 81 84    Resp: 18 15 (!) 22 16  Temp: 98.9 F (37.2 C) 98.2 F (36.8 C) 98 F (36.7 C) (!) 96.4 F (35.8 C)  TempSrc: Oral Oral Oral Temporal  SpO2: 99% 99% 100% 100%  Weight:      Height:        Intake/Output Summary (Last 24 hours) at 05/11/2021 1436 Last data filed at 05/11/2021 1415 Gross per 24 hour  Intake 2053 ml  Output 1100 ml  Net 953 ml    Filed Weights   05/09/21 1539  Weight: 52 kg    Examination: General exam: Appears calm and comfortable. Thin elderly male Respiratory system: Clear to auscultation. Respiratory effort normal. Cardiovascular system: S1 & S2 heard, RRR. No pedal edema. Gastrointestinal system: Abdomen is nondistended, soft and nontender. Normal bowel sounds heard. Central nervous system: Alert and oriented. Non focal exam. Speech clear  Extremities: Symmetric in appearance bilaterally  Skin: No rashes, lesions or ulcers on exposed skin  Psychiatry: Judgement and insight appear stable. Mood & affect appropriate.    Data  Reviewed: I have personally reviewed following labs and imaging studies  CBC: Recent Labs  Lab 05/09/21 1630 05/09/21 2236 05/10/21 0408 05/10/21 1014 05/11/21 0539  WBC 9.0  --  6.8  --  11.9*  NEUTROABS 7.0  --   --   --   --   HGB 7.3* 7.8* 8.2* 8.7* 10.1*  HCT 23.4* 23.6* 24.2* 25.9* 30.3*  MCV 94.0  --  90.0  --  91.0  PLT 192  --  146*  --  086    Basic Metabolic Panel: Recent Labs  Lab 05/09/21 1630 05/10/21 0408 05/11/21 0539  NA 131* 132*  130*  K 4.2 4.0 4.0  CL 99 100 97*  CO2 25 27 26   GLUCOSE 149* 158* 81  BUN 24* 20 13  CREATININE 1.25* 1.25* 1.07  CALCIUM 8.5* 8.4* 8.9    GFR: Estimated Creatinine Clearance: 42.5 mL/min (by C-G formula based on SCr of 1.07 mg/dL). Liver Function Tests: Recent Labs  Lab 05/09/21 1630  AST 20  ALT 17  ALKPHOS 187*  BILITOT 0.5  PROT 5.9*  ALBUMIN 2.9*    No results for input(s): LIPASE, AMYLASE in the last 168 hours. No results for input(s): AMMONIA in the last 168 hours. Coagulation Profile: Recent Labs  Lab 05/09/21 1630  INR 1.1    Cardiac Enzymes: No results for input(s): CKTOTAL, CKMB, CKMBINDEX, TROPONINI in the last 168 hours. BNP (last 3 results) No results for input(s): PROBNP in the last 8760 hours. HbA1C: No results for input(s): HGBA1C in the last 72 hours. CBG: Recent Labs  Lab 05/10/21 1158 05/10/21 1739 05/10/21 2151 05/11/21 0809 05/11/21 1146  GLUCAP 130* 110* 94 93 103*    Lipid Profile: No results for input(s): CHOL, HDL, LDLCALC, TRIG, CHOLHDL, LDLDIRECT in the last 72 hours. Thyroid Function Tests: No results for input(s): TSH, T4TOTAL, FREET4, T3FREE, THYROIDAB in the last 72 hours. Anemia Panel: No results for input(s): VITAMINB12, FOLATE, FERRITIN, TIBC, IRON, RETICCTPCT in the last 72 hours. Sepsis Labs: No results for input(s): PROCALCITON, LATICACIDVEN in the last 168 hours.  No results found for this or any previous visit (from the past 240 hour(s)).     Radiology Studies: No results found.    Scheduled Meds:  [MAR Hold] atorvastatin  20 mg Oral Daily   [MAR Hold] diphenhydrAMINE  25 mg Oral Once   [MAR Hold] hydrocortisone   Rectal BID   [MAR Hold] insulin aspart  0-5 Units Subcutaneous QHS   [MAR Hold] insulin aspart  0-9 Units Subcutaneous TID WC   [MAR Hold] insulin glargine-yfgn  8 Units Subcutaneous Daily   [MAR Hold] lipase/protease/amylase  72,000 Units Oral TID AC   [MAR Hold] losartan  12.5 mg Oral Daily   [MAR Hold] pantoprazole (PROTONIX) IV  40 mg Intravenous Q12H   Continuous Infusions:  lactated ringers 75 mL/hr at 05/11/21 0308     LOS: 2 days      Time spent: 25 minutes   Dessa Phi, DO [facility] 05/11/2021, 2:36 PM   Available via Epic secure chat 7am-7pm After these hours, please refer to coverage provider listed on [email or web address]

## 2021-05-11 NOTE — Anesthesia Postprocedure Evaluation (Signed)
Anesthesia Post Note  Patient: IASIAH OZMENT  Procedure(s) Performed: ESOPHAGOGASTRODUODENOSCOPY (EGD) WITH PROPOFOL HOT HEMOSTASIS (ARGON PLASMA COAGULATION/BICAP) BIOPSY     Patient location during evaluation: Endoscopy Anesthesia Type: MAC Level of consciousness: awake Pain management: pain level controlled Vital Signs Assessment: post-procedure vital signs reviewed and stable Respiratory status: spontaneous breathing, nonlabored ventilation, respiratory function stable and patient connected to nasal cannula oxygen Cardiovascular status: stable and blood pressure returned to baseline Postop Assessment: no apparent nausea or vomiting Anesthetic complications: no   No notable events documented.  Last Vitals:  Vitals:   05/11/21 1440 05/11/21 1450  BP: (!) 177/87 (!) 175/83  Pulse: 74 74  Resp: 15 14  Temp:    SpO2: 98% 100%    Last Pain:  Vitals:   05/11/21 1450  TempSrc:   PainSc: 0-No pain                 Keishaun Hazel P Valborg Friar

## 2021-05-11 NOTE — Op Note (Signed)
[facility] Patient Name: Manuel Olson Procedure Date: 05/11/2021 MRN: [id number] Attending MD: Estill Cotta. Loletha Carrow , MD Date of Birth: September 27, 1945 CSN: [id number] Age: 75 Admit Type: Inpatient Procedure:                Upper GI endoscopy Indications:              Acute post hemorrhagic anemia, Hematochezia                           recurrent GI bleeding from GE anastomotic source,                            including < 2 weeks ago Providers:                Mallie Mussel L. Loletha Carrow, MD, Kary Kos RN, RN, Janee Morn, Technician, Danley Danker, CRNA Referring MD:             [facility] Medicines:                Monitored Anesthesia Care Complications:            No immediate complications. Estimated Blood Loss:     Estimated blood loss was minimal. Procedure:                Pre-Anesthesia Assessment:                           - Prior to the procedure, a History and Physical                            was performed, and patient medications and                            allergies were reviewed. The patient's tolerance of                            previous anesthesia was also reviewed. The risks                            and benefits of the procedure and the sedation                            options and risks were discussed with the patient.                            All questions were answered, and informed consent                            was obtained. Prior Anticoagulants: The patient has                            taken no previous anticoagulant or antiplatelet  agents. ASA Grade Assessment: III - A patient with                            severe systemic disease. After reviewing the risks                            and benefits, the patient was deemed in                            satisfactory condition to undergo the procedure.                           After obtaining informed consent, the endoscope was                             passed under direct vision. Throughout the                            procedure, the patient's blood pressure, pulse, and                            oxygen saturations were monitored continuously. The                            [id number]-H190 ([id number]) Olympus endoscope was introduced                            through the mouth, and advanced to the afferent and                            efferent jejunal loops. The upper GI endoscopy was                            performed with difficulty due to post-surgical                            anatomy and abdominal breathing. The patient                            tolerated the procedure fairly well. Scope In: Scope Out: Findings:      The esophagus was normal.      Evidence of a classic Whipple was found in the gastric body. This was       characterized by a hemorrhagic appearance. Mucosa was not bleeding upon       entering the stomach, but area quite friable with gentle irrigation or       scope contact. 2 clips in this area (from recent EGD). 2 additional       clips near this area in the stomach (previously described and most       likely from 12/2019 EGD) Biopsies were taken with a cold forceps for       histology because there was an area of irregular mucosa (to rule out       malignancy). Spot-treatment coagulation for hemostasis using argon  plasma at 0.4 liters/minute and 20 watts was successful. Visualization       and treatment quite challenging due to altered anatomy (which also made       insufflation difficult) and patient's abdominal breathing.      The examined jejunum was normal (afferent and efferent limbs). Impression:               - Normal esophagus.                           - A classic Whipple was found, characterized by a                            hemorrhagic appearance. Biopsied. Treated with                            argon plasma coagulation (APC).                           - Normal  examined jejunum. Moderate Sedation:      MAC sedation used Recommendation:           - Return patient to hospital ward for ongoing care.                           - Resume regular diet.                           - Continue present medications.                           - Hgb/Hct Q12 hr x 5                           - Patient was not on plavix since recent admission                            for GI bleed and should not resume it. Rebleeding                            risk high.                           No further endoscopic therapy available for this                            bleeding source. 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 All rights reserved. The codes documented in this report are preliminary and upon coder review may  be revised to meet current compliance requirements. Danita Proud L. Loletha Carrow, MD 05/11/2021 2:22:19 PM This report has been signed electronically. Number of Addenda: 0

## 2021-05-12 ENCOUNTER — Telehealth: Payer: Self-pay | Admitting: Gastroenterology

## 2021-05-12 DIAGNOSIS — C250 Malignant neoplasm of head of pancreas: Secondary | ICD-10-CM

## 2021-05-12 DIAGNOSIS — C641 Malignant neoplasm of right kidney, except renal pelvis: Secondary | ICD-10-CM

## 2021-05-12 DIAGNOSIS — I10 Essential (primary) hypertension: Secondary | ICD-10-CM

## 2021-05-12 DIAGNOSIS — K921 Melena: Secondary | ICD-10-CM

## 2021-05-12 DIAGNOSIS — D62 Acute posthemorrhagic anemia: Secondary | ICD-10-CM

## 2021-05-12 DIAGNOSIS — N179 Acute kidney failure, unspecified: Secondary | ICD-10-CM

## 2021-05-12 DIAGNOSIS — D649 Anemia, unspecified: Secondary | ICD-10-CM

## 2021-05-12 DIAGNOSIS — E119 Type 2 diabetes mellitus without complications: Secondary | ICD-10-CM

## 2021-05-12 DIAGNOSIS — I2510 Atherosclerotic heart disease of native coronary artery without angina pectoris: Secondary | ICD-10-CM

## 2021-05-12 DIAGNOSIS — E871 Hypo-osmolality and hyponatremia: Secondary | ICD-10-CM

## 2021-05-12 DIAGNOSIS — Z794 Long term (current) use of insulin: Secondary | ICD-10-CM

## 2021-05-12 LAB — CBC
HCT: 27.4 % — ABNORMAL LOW (ref 39.0–52.0)
Hemoglobin: 9.1 g/dL — ABNORMAL LOW (ref 13.0–17.0)
MCH: 30 pg (ref 26.0–34.0)
MCHC: 33.2 g/dL (ref 30.0–36.0)
MCV: 90.4 fL (ref 80.0–100.0)
Platelets: 153 10*3/uL (ref 150–400)
RBC: 3.03 MIL/uL — ABNORMAL LOW (ref 4.22–5.81)
RDW: 16.3 % — ABNORMAL HIGH (ref 11.5–15.5)
WBC: 16.7 10*3/uL — ABNORMAL HIGH (ref 4.0–10.5)
nRBC: 0 % (ref 0.0–0.2)

## 2021-05-12 LAB — GLUCOSE, CAPILLARY
Glucose-Capillary: 160 mg/dL — ABNORMAL HIGH (ref 70–99)
Glucose-Capillary: 160 mg/dL — ABNORMAL HIGH (ref 70–99)

## 2021-05-12 LAB — SURGICAL PATHOLOGY

## 2021-05-12 MED ORDER — MELATONIN 5 MG PO TABS: 5.0000 mg | ORAL_TABLET | Freq: Every day | ORAL | Status: DC

## 2021-05-12 MED ORDER — SUCRALFATE 1 G PO TABS: 1.0000 g | ORAL_TABLET | Freq: Four times a day (QID) | ORAL | 0 refills | Status: AC

## 2021-05-12 MED ORDER — DIPHENOXYLATE-ATROPINE 2.5-0.025 MG PO TABS: 1.0000 | ORAL_TABLET | Freq: Every day | ORAL | 1 refills | Status: AC

## 2021-05-12 NOTE — Telephone Encounter (Signed)
Pt's wife called inquiring about prescription for Lomotil. She stated that pt saw Nevin Bloodgood in the hospital and she told her that he would be prescribed lomotil to help with diarrhea. However, it was not sent to his pharmacy.

## 2021-05-12 NOTE — Evaluation (Signed)
Physical Therapy Evaluation Patient Details Name: Manuel Olson MRN: [id number] DOB: 1946/01/11 Today's Date: 05/12/2021   History of Present Illness  patient is a 75 year old male who was admitted on 8/28 with redness in stool. patient was found to have GI bleed. patient was recently admitteed on 6/23. PMH: back pain, CAD, HTN., CVA, HL:D, MI, pancreatic CA s/p whipple procedure, TAVR, shoulder athroscopy, COVID19 6/13  Clinical Impression  Pt is a 75 y.o. male with above HPI. Pt ambulated ~116f with use of RW and ~1050fwithout use of assistive device with MIN guard-supervision, no overt LOB observed. Pt is currently supervision for all mobility and is safe to continue mobilizing with nursing staff. No skilled PT needs identified while in the acute setting at this time, PT to delist. Please reconsult if mobility status changes. Recommend resuming OPPT services upon d/c with supervision from family. Pt is currently at mobility level that is safe for d/c to home. Pt will benefit from continued skilled PT in the outpatient setting to increase their independence, improve strength, and maximize safety with mobility.      Follow Up Recommendations Outpatient PT (resume OPPT)    Equipment Recommendations  None recommended by PT    Recommendations for Other Services       Precautions / Restrictions Precautions Precautions: Fall Restrictions Weight Bearing Restrictions: No      Mobility  Bed Mobility Overal bed mobility: Modified Independent             General bed mobility comments: with education to avoid straining abdominal area.    Transfers Overall transfer level: Needs assistance Equipment used: Rolling walker (2 wheeled);None Transfers: Sit to/from Stand Sit to Stand: Supervision Stand pivot transfers: Min guard       General transfer comment: supervision for safety,pt demonstrating safe hand placement without cuing  Ambulation/Gait Ambulation/Gait  assistance: Min guard;Supervision Gait Distance (Feet): 200 Feet Assistive device: Rolling walker (2 wheeled);None Gait Pattern/deviations: Step-through pattern;Decreased stride length Gait velocity: WNL   General Gait Details: Pt with use of RW for ~1009f2 cues to maintain close proximity to RW for safety, pt requesting to attempt without RW stating "it's getting in my way, slowing me down". Pt ambulated ~100f66fthout use of RW with CGA-supervision, mild balance impairments observed with intermittent drift/swaying, no overt LOB.  Stairs            Wheelchair Mobility    Modified Rankin (Stroke Patients Only)       Balance Overall balance assessment: Mild deficits observed, not formally tested                                           Pertinent Vitals/Pain Pain Assessment: No/denies pain Faces Pain Scale: Hurts a little bit Pain Location: low back reported being chronic Pain Descriptors / Indicators: Grimacing;Sore Pain Intervention(s): Limited activity within patient's tolerance;Monitored during session    [place] to[place] Private residence Living Arrangements: Spouse/significant other Available Help at Discharge: Family;Available 24 hours/day Type of Home: House Home Access: Stairs to enter Entrance Stairs-Rails: None Entrance Stairs-Number of Steps: 3 Home Layout: Two level;Able to live on main level with bedroom/bathroom Home Equipment: WalkGilford Rile wheels;Shower seat      Prior Function Level of Independence: Independent with assistive device(s)         Comments: Pt and wife report  use of RW PRN when pt is "dizzy" for improved stability. Pt was participating in [place] prior to admission, had to cancel future appointments with current admission to hospital.     Hand Dominance   Dominant Hand: Right    Extremity/Trunk Assessment   Upper Extremity Assessment Upper Extremity Assessment: Overall WFL  for tasks assessed    Lower Extremity Assessment Lower Extremity Assessment: Generalized weakness    Cervical / Trunk Assessment Cervical / Trunk Assessment: Normal  Communication   Communication: HOH  Cognition Arousal/Alertness: Awake/alert Behavior During Therapy: WFL for tasks assessed/performed Overall Cognitive Status: Within Functional Limits for tasks assessed                                        General Comments General comments (skin integrity, edema, etc.): patient was educated on taking it slow at  home and not increasing activity too quickly. patient verbalized understanding but was noted to have some conflicting reports when attempting to teach back education as to how he was doing activities at home after last hospital d/c patient was reeducated at this time. patient verbalized understanding.    Exercises     Assessment/Plan    PT Assessment Patent does not need any further PT services;All further PT needs can be met in the next venue of care  PT Problem List Decreased strength;Decreased balance       PT Treatment Interventions      PT Goals (Current goals can be found in the Care Plan section)  Acute Rehab PT Goals Patient Stated Goal: to go home today PT Goal Formulation: With patient/family Time For Goal Achievement: 05/26/21 Potential to Achieve Goals: Good    Frequency  (1 time eval)   Barriers to discharge        Co-evaluation               AM-PAC PT "6 Clicks" Mobility  Outcome Measure Help needed turning from your back to your side while in a flat bed without using bedrails?: None Help needed moving from lying on your back to sitting on the side of a flat bed without using bedrails?: None Help needed moving to and from a bed to a chair (including a wheelchair)?: A Little Help needed standing up from a chair using your arms (e.g., wheelchair or bedside chair)?: A Little Help needed to walk in hospital room?: A  Little Help needed climbing 3-5 steps with a railing? : A Little 6 Click Score: 20    End of Session Equipment Utilized During Treatment: Gait belt Activity Tolerance: Patient tolerated treatment well Patient left: in bed;with call bell/phone within reach;with family/visitor present Nurse Communication: Mobility status PT Visit Diagnosis: Unsteadiness on feet (R26.81)    Time: 6237-6283 PT Time Calculation (min) (ACUTE ONLY): 16 min   Charges:   PT Evaluation $PT Eval Low Complexity: 1 Low          Festus Barren PT, DPT  Acute Rehabilitation Services  Office [phone]  05/12/2021, 2:32 PM

## 2021-05-12 NOTE — Telephone Encounter (Signed)
Per Paula's note, "Long discussion with wife and patient about chronic diarrhea. Going forward I have asked them to try one Lomotil every morning instead of taking it as needed."  Rx will be sent to pharmacy.

## 2021-05-12 NOTE — Discharge Summary (Signed)
Physician Discharge Summary  Manuel Olson NOB:[id number] DOB: 10/07/1945 DOA: 05/09/2021  PCP: [name], MD  Admit date: 05/09/2021 Discharge date: 05/12/2021  Admitted From: Home Disposition: Home  Recommendations for Outpatient Follow-up:  Follow ups as below. Please obtain CBC/BMP/Mag at follow up Please follow up on the following pending results: None  Home Health: Not indicated Equipment/Devices: Not indicated  Discharge Condition: Stable CODE STATUS: Full code   Follow-up Information     [name], MD. Schedule an appointment as soon as possible for a visit in 1 week(s).   Specialty: Family Medicine Contact information: [place] [address] [phone]                   Hospital Course: 75 year old M with PMH of pancreatic cancer s/p Whipple, RCC s/p nephrectomy, AVR, CAD s/p stents, DM-2, HTN, PUD, GIB, HLD and recent hospitalization for upper GIB due to bleeding gastric vessel for which he was treated endoscopically with clips and epinephrine, blood transfusions and taken off Plavix, returning with acute hematochezia, fatigue, dizziness and low BP, admitted for symptomatic ABLA from recurrent GI bleed.  He was transfused 1 unit with appropriate response.  Underwent EGD that showed hemorrhagic appearance of Whipple for which he was treated with APC.  H&H remained stable.  Patient did not have clinical evidence of further GI bleed.  He was cleared for discharge by GI on p.o. Protonix 40 mg twice daily and Carafate 4 times daily for 1 month, for outpatient follow-up.   See individual problem list below for more hospital course.  Discharge Diagnoses:  Symptomatic ABLA due to recurrent GIB-Hgb dipped at 7.3 but appropriately responded after 1 unit.  EGD with hemorrhagic appearance of previous Whipple which was treated with APC.  GI bleed seems to have resolved clinically.  Cleared for discharge by GI for outpatient  follow-up. Recent Labs    05/01/21 1550 05/02/21 0403 05/02/21 1559 05/03/21 0322 05/09/21 1630 05/09/21 2236 05/10/21 0408 05/10/21 1014 05/11/21 0539 05/12/21 0555  HGB 8.8* 8.7* 8.9* 8.5* 7.3* 7.8* 8.2* 8.7* 10.1* 9.1*  -Recheck CBC in 1 to 2 weeks or sooner if needed -P.o. Protonix 40 mg twice daily -Carafate 1 g 4 times daily for 1 months -GI to arrange outpatient follow-up   Essential hypertension: Normotensive. -Continue Cozaar  DM-2 with hyperglycemia and hyperlipidemia: A1c 7.2% on 03/05/2021. Recent Labs  Lab 05/11/21 1146 05/11/21 1449 05/11/21 2152 05/12/21 0734 05/12/21 1238  GLUCAP 103* 116* 170* 160* 160*  -Continue home regimen.  History of CAD: No anginal symptoms. -Plavix has been discontinued since previous hospital stay due to GI bleed   Cancer of head of pancreas [facility]) s/p Whipple 02/2018 -Continue home Creon  Renal cell carcinoma of right kidney [facility]) s/p nephrectomy 02/2018  Leukocytosis/bandemia: Likely demargination versus infectious process. -Recheck CBC at follow-up  Underweight Body mass index is 17.43 kg/m.         Pressure Injury 04/27/21 Coccyx Mid;Upper Stage 1 -  Intact skin with non-blanchable redness of a localized area usually over a bony prominence. (Active)  04/27/21 0402  Location: Coccyx  Location Orientation: Mid;Upper  Staging: Stage 1 -  Intact skin with non-blanchable redness of a localized area usually over a bony prominence.  Wound Description (Comments):   Present on Admission: Yes    Discharge Exam: Vitals:   05/11/21 2052 05/12/21 0500  BP: (!) 142/90 (!) 141/77  Pulse: 86 86  Resp: 16 16  Temp: 98.7 F (  37.1 C) 98.7 F (37.1 C)  SpO2: 97% 96%    GENERAL: No apparent distress.  Nontoxic. HEENT: MMM.  Vision and hearing grossly intact.  NECK: Supple.  No apparent JVD.  RESP: On RA.  No IWOB.  Fair aeration bilaterally. CVS:  RRR. Heart sounds normal.  ABD/GI/GU: Bowel sounds present. Soft. Non  tender.  MSK/EXT:  Moves extremities.  Significant muscle mass and subcu fat loss. SKIN: no apparent skin lesion or wound NEURO: Awake, alert and oriented appropriately.  No apparent focal neuro deficit. PSYCH: Calm. Normal affect.   Discharge Instructions  Discharge Instructions     Call MD for:  difficulty breathing, headache or visual disturbances   Complete by: As directed    Call MD for:  extreme fatigue   Complete by: As directed    Call MD for:  persistant dizziness or light-headedness   Complete by: As directed    Diet - low sodium heart healthy   Complete by: As directed    Diet Carb Modified   Complete by: As directed    Discharge instructions   Complete by: As directed    It has been a pleasure taking care of you!  You were hospitalized due to rectal bleed for which you have been treated endoscopically and medically.  Your bleeding seems to have subsided.  We are discharging you on Protonix twice daily and Carafate 4 times daily.  We strongly recommend avoiding any over-the-counter pain medication other than plain Tylenol.  Follow-up with your primary care doctor in 1 to 2 weeks to have your blood levels rechecked.  Also follow-up with your gastroenterologist as recommended to you.  Continue monitoring your stool for bright red blood or dark tarry stool.   Take care,   Increase activity slowly   Complete by: As directed    No wound care   Complete by: As directed       Allergies as of 05/12/2021       Reactions   Carvedilol Other (See Comments)   "Could not function when taking this"        Medication List     TAKE these medications    acetaminophen 325 MG tablet Commonly known as: TYLENOL Take 325-650 mg by mouth every 6 (six) hours as needed for mild pain.   atorvastatin 20 MG tablet Commonly known as: LIPITOR Take 1 tablet (20 mg total) by mouth daily.   Basaglar KwikPen 100 UNIT/ML Inject 8 Units into the skin daily.   feeding supplement  Liqd Take 237 mLs by mouth 2 (two) times daily between meals. What changed:  when to take this reasons to take this   FreeStyle Libre 14 Day Sensor Misc Inject 1 patch into the skin every 14 (fourteen) days.   insulin lispro 100 UNIT/ML KwikPen Commonly known as: HumaLOG KwikPen Inject 5-8 Units into the skin 3 (three) times daily before meals. Inject 5-8 units 3 times a day with meals. What changed:  how much to take additional instructions   lipase/protease/amylase 36000 UNITS Cpep capsule Commonly known as: Creon Take 2 capsules (72,000 Units total) by mouth 3 (three) times daily before meals. Pt taking 2 tab before meals.   losartan 25 MG tablet Commonly known as: COZAAR Take 0.5 tablets (12.5 mg total) by mouth daily.   multivitamin with minerals Tabs tablet Take 1 tablet by mouth daily.   nitroGLYCERIN 0.4 MG SL tablet Commonly known as: NITROSTAT Place 1 tablet (0.4 mg total) under the tongue every  5 (five) minutes x 3 doses as needed for chest pain (if no relief after 2nd dose, proceed to the ED for an evaluation or call 911).   pantoprazole 40 MG tablet Commonly known as: PROTONIX Take 1 tablet (40 mg total) by mouth 2 (two) times daily.   Pen Needles 31G X 6 MM Misc Use as directed to check blood sugar 4 times a day.   sucralfate 1 g tablet Commonly known as: Carafate Take 1 tablet (1 g total) by mouth 4 (four) times daily.        Consultations: Gastroenterology  Procedures/Studies: Upper endoscopy Normal esophagus. A classic Whipple was found, characterized by a hemorrhagic appearance. Biopsied. Treated with argon plasma coagulation (APC). Normal examined jejunum.   NM GI Blood Loss  Result Date: 04/28/2021 CLINICAL DATA:  GI bleed. Large volume of blood per rectum last night. Right lower quadrant abdominal pain. EXAM: NUCLEAR MEDICINE GASTROINTESTINAL BLEEDING SCAN TECHNIQUE: Sequential abdominal images were obtained following intravenous  administration of Tc-56mlabeled red blood cells. RADIOPHARMACEUTICALS:  20.8 mCi Tc-978mertechnetate in-vitro labeled red cells. COMPARISON:  Abdominopelvic CT 05/04/2021. FINDINGS: Imaging through 2 hours demonstrates no evidence of active gastrointestinal bleeding. There is normal blood pool and hepatic activity. Mildly prominent activity in the left upper quadrant does not move and likely is gastric. Some bladder activity is noted on the delayed images. IMPRESSION: No evidence of active gastrointestinal bleeding. Electronically Signed   By: WiRichardean Sale.D.   On: 04/28/2021 16:30   DG Chest Portable 1 View  Result Date: 04/26/2021 CLINICAL DATA:  Shortness of breath. Pt is here for weakness and dizziness from cancer center EXAM: PORTABLE CHEST 1 VIEW COMPARISON:  Chest x-ray 03/04/2021. FINDINGS: The heart size and mediastinal contours are unchanged. Status post aortic valve replacement. Aortic calcification. Hyperinflation. No focal consolidation. Coarsened interstitial markings with no overt pulmonary edema. No pleural effusion. No pneumothorax. No acute osseous abnormality. IMPRESSION: 1. No active disease. 2. Aortic Atherosclerosis (ICD10-I70.0) and Emphysema (ICD10-J43.9). Electronically Signed   By: MoIven Finn.D.   On: 04/26/2021 16:37       The results of significant diagnostics from this hospitalization (including imaging, microbiology, ancillary and laboratory) are listed below for reference.     Microbiology: No results found for this or any previous visit (from the past 240 hour(s)).   Labs:  CBC: Recent Labs  Lab 05/09/21 1630 05/09/21 2236 05/10/21 0408 05/10/21 1014 05/11/21 0539 05/12/21 0555  WBC 9.0  --  6.8  --  11.9* 16.7*  NEUTROABS 7.0  --   --   --   --   --   HGB 7.3* 7.8* 8.2* 8.7* 10.1* 9.1*  HCT 23.4* 23.6* 24.2* 25.9* 30.3* 27.4*  MCV 94.0  --  90.0  --  91.0 90.4  PLT 192  --  146*  --  157 153   BMP &GFR Recent Labs  Lab 05/09/21 1630  05/10/21 0408 05/11/21 0539  NA 131* 132* 130*  K 4.2 4.0 4.0  CL 99 100 97*  CO2 25 27 26   GLUCOSE 149* 158* 81  BUN 24* 20 13  CREATININE 1.25* 1.25* 1.07  CALCIUM 8.5* 8.4* 8.9   Estimated Creatinine Clearance: 42.5 mL/min (by C-G formula based on SCr of 1.07 mg/dL). Liver & Pancreas: Recent Labs  Lab 05/09/21 1630  AST 20  ALT 17  ALKPHOS 187*  BILITOT 0.5  PROT 5.9*  ALBUMIN 2.9*   No results for input(s): LIPASE, AMYLASE in the last  168 hours. No results for input(s): AMMONIA in the last 168 hours. Diabetic: No results for input(s): HGBA1C in the last 72 hours. Recent Labs  Lab 05/11/21 1146 05/11/21 1449 05/11/21 2152 05/12/21 0734 05/12/21 1238  GLUCAP 103* 116* 170* 160* 160*   Cardiac Enzymes: No results for input(s): CKTOTAL, CKMB, CKMBINDEX, TROPONINI in the last 168 hours. No results for input(s): PROBNP in the last 8760 hours. Coagulation Profile: Recent Labs  Lab 05/09/21 1630  INR 1.1   Thyroid Function Tests: No results for input(s): TSH, T4TOTAL, FREET4, T3FREE, THYROIDAB in the last 72 hours. Lipid Profile: No results for input(s): CHOL, HDL, LDLCALC, TRIG, CHOLHDL, LDLDIRECT in the last 72 hours. Anemia Panel: No results for input(s): VITAMINB12, FOLATE, FERRITIN, TIBC, IRON, RETICCTPCT in the last 72 hours. Urine analysis:    Component Value Date/Time   COLORURINE YELLOW 05/09/2021 1834   APPEARANCEUR CLEAR 05/09/2021 1834   APPEARANCEUR Clear 08/13/2020 0952   LABSPEC 1.014 05/09/2021 1834   PHURINE 5.0 05/09/2021 1834   GLUCOSEU NEGATIVE 05/09/2021 1834   HGBUR NEGATIVE 05/09/2021 1834   BILIRUBINUR NEGATIVE 05/09/2021 1834   BILIRUBINUR Negative 08/13/2020 [place] 05/09/2021 1834   PROTEINUR NEGATIVE 05/09/2021 1834   NITRITE NEGATIVE 05/09/2021 1834   LEUKOCYTESUR NEGATIVE 05/09/2021 1834   Sepsis Labs: Invalid input(s): PROCALCITONIN, LACTICIDVEN   Time coordinating discharge: 45  minutes  SIGNED:  Mercy Riding, MD  [facility] 05/12/2021, 6:30 PM  If 7PM-7AM, please contact night-coverage [email or web address] Home

## 2021-05-12 NOTE — Progress Notes (Signed)
Progress Note Hospital Day: 4  Chief Complaint:         ASSESSMENT AND PLAN   # 75 yo male with history of pancreatic adenocarcinoma. 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  Adjuvant chemotherapy completed 06/2018. Followed by Dr. Benay Spice.   # GI bleed ( recurrent)  Admitted several days ago with an anastomotic  bleed on plavix s/p Epi injection and clip placement with control of bleeding. Discharged home OFF Plavix, readmitted 8/28 with rectal bleeding ( still off plavix).  Repeat EGD yesterday >> Whipple was found in the gastric body. This was characterized by a hemorrhagic appearance / friable gastric mucosa. Gastric clips in place from earlier procedure. Biopsies taken from an area of irregular mucosa to rule out malignancy.  --Though bleeding apparently from anastomosis would continue Ansuol cream PR for total of 7 days since he does have hemorrhoids.  --Continue BID PPI --Continue carafate suspension for total of 4 weeks --Gastric biopsies pending --Patient really wants to go home today. He hasn't had any further bleeding ( had non-bloody BM this am). Will talk with Dr. Loletha Carrow but I suspect he will be okay with discharge.  --Follow up with me 10/4 at 10am   #  Leukocytosis. WBC up from 119. To 16.7 overnight. Etiology?    # Acute on chronic anemia  --Hgb stable at 9.1 post 1uPRBC  # Prolonged QT interval   # Chronic diarrhea post Whipple. Takes Creon 72K units TID before meals ( none with snacks) and uses Lomotil as needed.  --Continue home Creon.  --Long discussion with wife and patient about chronic diarrhea. Going forward I have asked them to try one Lomotil every morning instead of taking it as needed. Depending on how things go we can always increase dose. I cautioned them about  watching for constipation / overflow diarrhea, especially since he will also be on Carafate for a few weeks.  --Will reevaluated at follow up appt.     # AKI on CKD3.  Resolved     DIAGNOSTIC STUDIES    05/11/21 EGD -Normal esophagus. - A classic Whipple was found, characterized by a hemorrhagic appearance. Biopsied. Treated with argon plasma coagulation (APC). --Normal examined jejunum  SUBJECTIVE     No further bleeding ( had a BM this am). Wants to go home   OBJECTIVE      Scheduled inpatient medications:   atorvastatin  20 mg Oral Daily   diphenhydrAMINE  25 mg Oral Once   hydrocortisone   Rectal BID   insulin aspart  0-5 Units Subcutaneous QHS   insulin aspart  0-9 Units Subcutaneous TID WC   insulin glargine-yfgn  8 Units Subcutaneous Daily   lipase/protease/amylase  72,000 Units Oral TID AC   losartan  12.5 mg Oral Daily   pantoprazole (PROTONIX) IV  40 mg Intravenous Q12H   sucralfate  1 g Oral TID WC & HS   Continuous inpatient infusions:   lactated ringers 75 mL/hr at 05/11/21 0308   PRN inpatient medications: acetaminophen **OR** acetaminophen  Vital signs in last 24 hours: Temp:  [96.4 F (35.8 C)-98.7 F (37.1 C)] 98.7 F (37.1 C) (08/31 0500) Pulse Rate:  [74-86] 86 (08/31 0500) Resp:  [14-22] 16 (08/31 0500) BP: (140-178)/(77-96) 141/77 (08/31 0500) SpO2:  [96 %-100 %] 96 % (08/31 0500) Last BM Date: 05/10/21  Intake/Output Summary (Last 24 hours) at 05/12/2021 0913 Last data filed at 05/11/2021 1818 Gross per 24 hour  Intake 780 ml  Output 550 ml  Net 230 ml     Physical Exam:  General: Alert male in NAD Heart:  Regular rate and rhythm. No lower extremity edema Pulmonary: Normal respiratory effort Abdomen: Soft, nondistended, nontender. Normal bowel sounds.  Neurologic: Alert and oriented Psych: Pleasant. Cooperative.   Filed Weights   05/09/21 1539  Weight: 52 kg    Intake/Output from previous day: 08/30 0701 - 08/31 0700 In:  780 [P.O.:480; I.V.:300] Out: 550 [Urine:500; Blood:50] Intake/Output this shift: No intake/output data recorded.    Lab Results: Recent Labs    05/10/21 0408 05/10/21 1014 05/11/21 0539 05/12/21 0555  WBC 6.8  --  11.9* 16.7*  HGB 8.2* 8.7* 10.1* 9.1*  HCT 24.2* 25.9* 30.3* 27.4*  PLT 146*  --  157 153   BMET Recent Labs    05/09/21 1630 05/10/21 0408 05/11/21 0539  NA 131* 132* 130*  K 4.2 4.0 4.0  CL 99 100 97*  CO2 25 27 26   GLUCOSE 149* 158* 81  BUN 24* 20 13  CREATININE 1.25* 1.25* 1.07  CALCIUM 8.5* 8.4* 8.9   LFT Recent Labs    05/09/21 1630  PROT 5.9*  ALBUMIN 2.9*  AST 20  ALT 17  ALKPHOS 187*  BILITOT 0.5   PT/INR Recent Labs    05/09/21 1630  LABPROT 14.5  INR 1.1   Hepatitis Panel No results for input(s): HEPBSAG, HCVAB, HEPAIGM, HEPBIGM in the last 72 hours.  No results found.     Principal Problem:   Lower GI bleed Active Problems:   Cancer of head of pancreas ([place]) s/p Whipple 02/2018   Essential hypertension, benign   CAD (coronary artery disease)   Insulin dependent type 2 diabetes mellitus ([place])   Hyponatremia   Renal cell carcinoma of right kidney ([place]) s/p nephrectomy 02/2018   Symptomatic anemia   AKI (acute kidney injury) ([place])   Prolonged QT interval     LOS: 3 days   Tye Savoy ,NP 05/12/2021, 9:13 AM

## 2021-05-12 NOTE — Evaluation (Signed)
Occupational Therapy Evaluation Patient Details Name: Manuel Olson MRN: [id number] DOB: Feb 14, 1946 Today's Date: 05/12/2021    History of Present Illness patient is a 75 year old male who was admitted on 8/28 with redness in stool. patient was found to have GI bleed. patient was recently admitteed on 6/23. PMH: back pain, CAD, HTN., CVA, HL:D, MI, pancreatic CA s/p whipple procedure, TAVR, shoulder athroscopy, COVID19 6/13   Clinical Impression   Patient evaluated by Occupational Therapy with no further acute OT needs identified. All education has been completed and the patient has no further questions. Patient remains at prior level for ADL tasks with rolling walker. Patient and wife are eager to transition home at this time. Patient recommended to have wife support 24/7 in next level of care.  See below for any follow-up Occupational Therapy or equipment needs. OT is signing off. Thank you for this referral.     Follow Up Recommendations  Outpatient OT    Equipment Recommendations  None recommended by OT    Recommendations for Other Services       Precautions / Restrictions Precautions Precautions: Fall Restrictions Weight Bearing Restrictions: No      Mobility Bed Mobility Overal bed mobility: Modified Independent             General bed mobility comments: with education to avoid straining abdominal area.    Transfers Overall transfer level: Needs assistance Equipment used: Rolling walker (2 wheeled);None Transfers: Sit to/from [employer] to Stand: Supervision Stand pivot transfers: Min guard       General transfer comment: for safety    Balance Overall balance assessment: Mild deficits observed, not formally tested                                         ADL either performed or assessed with clinical judgement   ADL Overall ADL's : At baseline                                       General  ADL Comments: patient is at baseline for ADLs with rolling walker at this time. patient continues to require education with keeping walker on the ground with min guard for mobility. patient was educated on supine to sit on edge of bed to reduce pressure on abdomenal area with patient taking increased time to realize that we are both stating same eduaction with different words. patient continues to need 24/7 sup from wife in next level of care.     Vision Patient Visual Report: No change from baseline       Perception     Praxis      Pertinent Vitals/Pain Faces Pain Scale: Hurts a little bit Pain Location: low back reported being chronic Pain Descriptors / Indicators: Grimacing;Sore Pain Intervention(s): Limited activity within patient's tolerance;Monitored during session     Hand Dominance Right   Extremity/Trunk Assessment Upper Extremity Assessment Upper Extremity Assessment: Overall WFL for tasks assessed   Lower Extremity Assessment Lower Extremity Assessment: Defer to PT evaluation   Cervical / Trunk Assessment Cervical / Trunk Assessment: Normal   Communication Communication Communication: [facility]   Cognition  General Comments  patient was educated on taking it slow at  home and not increasing activity too quickly. patient verbalized understanding but was noted to have some conflicting reports when attempting to teach back education as to how he was doing activities at home after last hospital d/c patient was reeducated at this time. patient verbalized understanding.    Exercises     Shoulder Instructions      Home Living Family/patient expects to be discharged to:: Private residence Living Arrangements: Spouse/significant other Available Help at Discharge: Family;Available 24 hours/day Type of Home: House Home Access: Stairs to enter [employer] of Steps: 3 Entrance Stairs-Rails: None Home  Layout: Two level;Able to live on main level with bedroom/bathroom     Bathroom Shower/Tub: Teacher, early years/pre: Standard Bathroom Accessibility: Yes How Accessible: Accessible via walker Home Equipment: [place] - 2 wheels;Shower seat          Prior Functioning/Environment Level of Independence: Independent with assistive device(s)        Comments: No assist with ADL/IADL at home.  Patient does not participate with meal prep or home management.  Spouse assist with community mobility.        OT Problem List: Decreased knowledge of use of DME or AE;Decreased activity tolerance;Decreased safety awareness      OT Treatment/Interventions:      OT Goals(Current goals can be found in the care plan section) Acute Rehab OT Goals Patient Stated Goal: to go home today OT Goal Formulation: All assessment and education complete, DC therapy  OT Frequency:     Barriers to D/C:            Co-evaluation              AM-PAC OT "6 Clicks" Daily Activity     Outcome Measure Help from another person eating meals?: None Help from another person taking care of personal grooming?: None Help from another person toileting, which includes using toliet, bedpan, or urinal?: None Help from another person bathing (including washing, rinsing, drying)?: None Help from another person to put on and taking off regular upper body clothing?: None Help from another person to put on and taking off regular lower body clothing?: None 6 Click Score: 24   End of Session Equipment Utilized During Treatment: Rolling walker Nurse Communication: Mobility status  Activity Tolerance: Patient tolerated treatment well Patient left: in bed;with call bell/phone within reach;with family/visitor present  OT Visit Diagnosis: Unsteadiness on feet (R26.81)                Time: 0034-9611 OT Time Calculation (min): 23 min Charges:  OT General Charges $OT Visit: 1 Visit OT Evaluation $OT Eval Low  Complexity: 1 Low OT Treatments $Self Care/Home Management : 8-22 mins  Jackelyn Poling OTR/L, MS Acute Rehabilitation Department Office# [phone] Pager# [phone] 05/12/2021, 12:25 PM

## 2021-05-13 ENCOUNTER — Telehealth: Payer: Self-pay

## 2021-05-13 ENCOUNTER — Other Ambulatory Visit: Payer: Self-pay

## 2021-05-13 DIAGNOSIS — K922 Gastrointestinal hemorrhage, unspecified: Secondary | ICD-10-CM

## 2021-05-13 DIAGNOSIS — D649 Anemia, unspecified: Secondary | ICD-10-CM

## 2021-05-13 NOTE — Telephone Encounter (Signed)
Rx was printed and faxed to [facility] at [phone]. I did receive fax confirmation.

## 2021-05-13 NOTE — Telephone Encounter (Signed)
Did the prescription get called to the pharmacy? The records say it was printed. Wife says the pharmacy did not have it. Thank you!

## 2021-05-13 NOTE — Telephone Encounter (Cosign Needed)
Transition Care Management Unsuccessful Follow-up Telephone Call  Date of discharge and from where:  05/12/21 [place]   Attempts:  1st Attempt  Reason for unsuccessful TCM follow-up call:  Left voice message

## 2021-05-14 ENCOUNTER — Telehealth: Payer: Self-pay

## 2021-05-14 ENCOUNTER — Encounter (HOSPITAL_COMMUNITY): Payer: Self-pay | Admitting: Gastroenterology

## 2021-05-14 NOTE — Telephone Encounter (Cosign Needed)
Transition Care Management Unsuccessful Follow-up Telephone Call  Date of discharge and from where:  05/12/21  Attempts:  3rd Attempt  Reason for unsuccessful TCM follow-up call:  Left voice message

## 2021-05-18 ENCOUNTER — Telehealth: Payer: Self-pay | Admitting: Gastroenterology

## 2021-05-18 NOTE — Telephone Encounter (Signed)
Inbound call from wife. States in recent hospital visit patient was prescribed sucralfate. Since on the medication feet swollen and stomach bloated. States Dr. Loletha Carrow wants him on it for a month and would like to see if this is normal. Wife is asking if someone see him when they come to get labs 9/7.   Best contact number [phone]

## 2021-05-19 ENCOUNTER — Other Ambulatory Visit (INDEPENDENT_AMBULATORY_CARE_PROVIDER_SITE_OTHER): Payer: Medicare Other

## 2021-05-19 ENCOUNTER — Telehealth: Payer: Self-pay

## 2021-05-19 DIAGNOSIS — D649 Anemia, unspecified: Secondary | ICD-10-CM

## 2021-05-19 DIAGNOSIS — K922 Gastrointestinal hemorrhage, unspecified: Secondary | ICD-10-CM

## 2021-05-19 LAB — CBC WITH DIFFERENTIAL/PLATELET
Basophils Absolute: 0 10*3/uL (ref 0.0–0.1)
Basophils Relative: 0.4 % (ref 0.0–3.0)
Eosinophils Absolute: 0 10*3/uL (ref 0.0–0.7)
Eosinophils Relative: 0.2 % (ref 0.0–5.0)
HCT: 25.6 % — ABNORMAL LOW (ref 39.0–52.0)
Hemoglobin: 8.4 g/dL — ABNORMAL LOW (ref 13.0–17.0)
Lymphocytes Relative: 5.2 % — ABNORMAL LOW (ref 12.0–46.0)
Lymphs Abs: 0.6 10*3/uL — ABNORMAL LOW (ref 0.7–4.0)
MCHC: 32.6 g/dL (ref 30.0–36.0)
MCV: 91.1 fl (ref 78.0–100.0)
Monocytes Absolute: 1 10*3/uL (ref 0.1–1.0)
Monocytes Relative: 8.2 % (ref 3.0–12.0)
Neutro Abs: 10.3 10*3/uL — ABNORMAL HIGH (ref 1.4–7.7)
Neutrophils Relative %: 86 % — ABNORMAL HIGH (ref 43.0–77.0)
Platelets: 171 10*3/uL (ref 150.0–400.0)
RBC: 2.81 Mil/uL — ABNORMAL LOW (ref 4.22–5.81)
RDW: 16.4 % — ABNORMAL HIGH (ref 11.5–15.5)
WBC: 12 10*3/uL — ABNORMAL HIGH (ref 4.0–10.5)

## 2021-05-19 NOTE — Telephone Encounter (Signed)
-----   Message from Willia Craze, NP sent at 05/19/2021  3:28 PM EDT ----- Hx of whipple. Two recent admissions for anastomotic bleeding.  Discharged from hospital about a week ago. Hgb today is 8.4, down from 9.1. Beth, forwarding to Dr. Loletha Carrow who followed him with me in hospital last week. Please call patient's wife and make sure that he has not had any further GI bleeding. Make sure taking PPI. Need to monitor closely. CBC in one week please. Call in interim or to ED if any GI bleeding. Thanks

## 2021-05-19 NOTE — Telephone Encounter (Signed)
Left message on machine to call back  

## 2021-05-19 NOTE — Telephone Encounter (Signed)
Although the carafate is the only new medicine, it does not cause swelling. Please do not stop taking it,  The swelling is most likely because of all the IV fluids and medicines given during a hospitalization.  Please see if you can help coordinate getting this patient seen by his PCP this week. Typically, patients see their PCP within 2 weeks after hospital discharge.  - HD

## 2021-05-19 NOTE — Telephone Encounter (Signed)
Spoke with the spouse. Patient answering questions in the background. No blood from rectum. No black stools. He is taking Carafate and Protonix as ordered. Remains a little dizzy but not worse than he has been. No nausea. He will return for labs 05/26/21.

## 2021-05-19 NOTE — Telephone Encounter (Signed)
I spoke with the pt wife and advised her that the pt should stay on carafate.  I explained that the swelling could be from the IV fluids and meds while hospitalized.  She states that they have already set up a PCP appt and will continue the carafate as ordered.

## 2021-05-19 NOTE — Telephone Encounter (Signed)
The pt wife called in to report that the pt has swollen feet and abdomen since starting carafate.  She says that is the only change he's made to medications.  Dr Loletha Carrow per the pt you spoke with them while the pt was hospitalized and advised that he take carafate for a month.  The wife did not give the dose today.  This is a Dr [name] pt but he is out of the office. Can you please review if he should stop carafate?

## 2021-05-19 NOTE — Addendum Note (Signed)
Addended by: Virgina Evener A on: 05/19/2021 04:49 PM   Modules accepted: Orders

## 2021-05-23 ENCOUNTER — Emergency Department (HOSPITAL_COMMUNITY): Payer: Medicare Other

## 2021-05-23 ENCOUNTER — Encounter (HOSPITAL_COMMUNITY): Payer: Self-pay | Admitting: Emergency Medicine

## 2021-05-23 ENCOUNTER — Inpatient Hospital Stay (HOSPITAL_COMMUNITY)
Admission: EM | Admit: 2021-05-23 | Discharge: 2021-05-24 | DRG: 280 | Disposition: A | Discharge status: Hospice | Payer: Medicare Other | Attending: Internal Medicine | Admitting: Internal Medicine

## 2021-05-23 ENCOUNTER — Other Ambulatory Visit: Payer: Self-pay

## 2021-05-23 ENCOUNTER — Encounter (HOSPITAL_COMMUNITY)
Admission: EM | Disposition: A | Discharge status: Hospice | Payer: Self-pay | Source: Home / Self Care | Attending: Pulmonary Disease

## 2021-05-23 DIAGNOSIS — E785 Hyperlipidemia, unspecified: Secondary | ICD-10-CM | POA: Diagnosis present

## 2021-05-23 DIAGNOSIS — Z9221 Personal history of antineoplastic chemotherapy: Secondary | ICD-10-CM

## 2021-05-23 DIAGNOSIS — I252 Old myocardial infarction: Secondary | ICD-10-CM

## 2021-05-23 DIAGNOSIS — C649 Malignant neoplasm of unspecified kidney, except renal pelvis: Secondary | ICD-10-CM | POA: Diagnosis not present

## 2021-05-23 DIAGNOSIS — K8681 Exocrine pancreatic insufficiency: Secondary | ICD-10-CM

## 2021-05-23 DIAGNOSIS — N183 Chronic kidney disease, stage 3 unspecified: Secondary | ICD-10-CM | POA: Diagnosis not present

## 2021-05-23 DIAGNOSIS — Z20822 Contact with and (suspected) exposure to covid-19: Secondary | ICD-10-CM | POA: Diagnosis present

## 2021-05-23 DIAGNOSIS — E1122 Type 2 diabetes mellitus with diabetic chronic kidney disease: Secondary | ICD-10-CM

## 2021-05-23 DIAGNOSIS — C250 Malignant neoplasm of head of pancreas: Secondary | ICD-10-CM

## 2021-05-23 DIAGNOSIS — I2119 ST elevation (STEMI) myocardial infarction involving other coronary artery of inferior wall: Secondary | ICD-10-CM | POA: Diagnosis present

## 2021-05-23 DIAGNOSIS — Z66 Do not resuscitate: Secondary | ICD-10-CM | POA: Diagnosis present

## 2021-05-23 DIAGNOSIS — I10 Essential (primary) hypertension: Secondary | ICD-10-CM | POA: Diagnosis not present

## 2021-05-23 DIAGNOSIS — Z888 Allergy status to other drugs, medicaments and biological substances status: Secondary | ICD-10-CM

## 2021-05-23 DIAGNOSIS — R64 Cachexia: Secondary | ICD-10-CM | POA: Diagnosis present

## 2021-05-23 DIAGNOSIS — R079 Chest pain, unspecified: Secondary | ICD-10-CM | POA: Diagnosis not present

## 2021-05-23 DIAGNOSIS — Z515 Encounter for palliative care: Secondary | ICD-10-CM

## 2021-05-23 DIAGNOSIS — N1831 Chronic kidney disease, stage 3a: Secondary | ICD-10-CM | POA: Diagnosis present

## 2021-05-23 DIAGNOSIS — Z8507 Personal history of malignant neoplasm of pancreas: Secondary | ICD-10-CM

## 2021-05-23 DIAGNOSIS — I2510 Atherosclerotic heart disease of native coronary artery without angina pectoris: Secondary | ICD-10-CM | POA: Diagnosis present

## 2021-05-23 DIAGNOSIS — R7989 Other specified abnormal findings of blood chemistry: Secondary | ICD-10-CM

## 2021-05-23 DIAGNOSIS — I2101 ST elevation (STEMI) myocardial infarction involving left main coronary artery: Secondary | ICD-10-CM | POA: Diagnosis not present

## 2021-05-23 DIAGNOSIS — I213 ST elevation (STEMI) myocardial infarction of unspecified site: Secondary | ICD-10-CM | POA: Diagnosis not present

## 2021-05-23 DIAGNOSIS — D62 Acute posthemorrhagic anemia: Secondary | ICD-10-CM

## 2021-05-23 DIAGNOSIS — R404 Transient alteration of awareness: Secondary | ICD-10-CM

## 2021-05-23 DIAGNOSIS — Z955 Presence of coronary angioplasty implant and graft: Secondary | ICD-10-CM

## 2021-05-23 DIAGNOSIS — Z87891 Personal history of nicotine dependence: Secondary | ICD-10-CM

## 2021-05-23 DIAGNOSIS — K219 Gastro-esophageal reflux disease without esophagitis: Secondary | ICD-10-CM | POA: Diagnosis present

## 2021-05-23 DIAGNOSIS — Z8673 Personal history of transient ischemic attack (TIA), and cerebral infarction without residual deficits: Secondary | ICD-10-CM | POA: Diagnosis not present

## 2021-05-23 DIAGNOSIS — Z8616 Personal history of COVID-19: Secondary | ICD-10-CM | POA: Diagnosis not present

## 2021-05-23 DIAGNOSIS — Z90411 Acquired partial absence of pancreas: Secondary | ICD-10-CM

## 2021-05-23 DIAGNOSIS — W1839XA Other fall on same level, initial encounter: Secondary | ICD-10-CM | POA: Diagnosis present

## 2021-05-23 DIAGNOSIS — R42 Dizziness and giddiness: Secondary | ICD-10-CM | POA: Diagnosis not present

## 2021-05-23 DIAGNOSIS — S0003XA Contusion of scalp, initial encounter: Secondary | ICD-10-CM | POA: Diagnosis present

## 2021-05-23 DIAGNOSIS — I129 Hypertensive chronic kidney disease with stage 1 through stage 4 chronic kidney disease, or unspecified chronic kidney disease: Secondary | ICD-10-CM | POA: Diagnosis present

## 2021-05-23 DIAGNOSIS — E118 Type 2 diabetes mellitus with unspecified complications: Secondary | ICD-10-CM | POA: Diagnosis not present

## 2021-05-23 DIAGNOSIS — Z7982 Long term (current) use of aspirin: Secondary | ICD-10-CM

## 2021-05-23 DIAGNOSIS — Z794 Long term (current) use of insulin: Secondary | ICD-10-CM

## 2021-05-23 DIAGNOSIS — Z85528 Personal history of other malignant neoplasm of kidney: Secondary | ICD-10-CM

## 2021-05-23 DIAGNOSIS — I214 Non-ST elevation (NSTEMI) myocardial infarction: Secondary | ICD-10-CM | POA: Diagnosis not present

## 2021-05-23 DIAGNOSIS — Z832 Family history of diseases of the blood and blood-forming organs and certain disorders involving the immune mechanism: Secondary | ICD-10-CM

## 2021-05-23 DIAGNOSIS — R531 Weakness: Secondary | ICD-10-CM | POA: Diagnosis not present

## 2021-05-23 DIAGNOSIS — E119 Type 2 diabetes mellitus without complications: Secondary | ICD-10-CM

## 2021-05-23 DIAGNOSIS — Z7401 Bed confinement status: Secondary | ICD-10-CM | POA: Diagnosis not present

## 2021-05-23 DIAGNOSIS — R5381 Other malaise: Secondary | ICD-10-CM | POA: Diagnosis not present

## 2021-05-23 DIAGNOSIS — Y92002 Bathroom of unspecified non-institutional (private) residence single-family (private) house as the place of occurrence of the external cause: Secondary | ICD-10-CM

## 2021-05-23 DIAGNOSIS — Z905 Acquired absence of kidney: Secondary | ICD-10-CM

## 2021-05-23 DIAGNOSIS — I959 Hypotension, unspecified: Secondary | ICD-10-CM | POA: Diagnosis not present

## 2021-05-23 DIAGNOSIS — N179 Acute kidney failure, unspecified: Secondary | ICD-10-CM | POA: Diagnosis present

## 2021-05-23 DIAGNOSIS — R570 Cardiogenic shock: Secondary | ICD-10-CM

## 2021-05-23 DIAGNOSIS — Z952 Presence of prosthetic heart valve: Secondary | ICD-10-CM | POA: Diagnosis not present

## 2021-05-23 DIAGNOSIS — Z7189 Other specified counseling: Secondary | ICD-10-CM | POA: Diagnosis not present

## 2021-05-23 DIAGNOSIS — Z8249 Family history of ischemic heart disease and other diseases of the circulatory system: Secondary | ICD-10-CM

## 2021-05-23 DIAGNOSIS — I519 Heart disease, unspecified: Secondary | ICD-10-CM | POA: Diagnosis not present

## 2021-05-23 DIAGNOSIS — Z974 Presence of external hearing-aid: Secondary | ICD-10-CM

## 2021-05-23 DIAGNOSIS — Z79899 Other long term (current) drug therapy: Secondary | ICD-10-CM

## 2021-05-23 DIAGNOSIS — R4182 Altered mental status, unspecified: Secondary | ICD-10-CM | POA: Diagnosis present

## 2021-05-23 DIAGNOSIS — I219 Acute myocardial infarction, unspecified: Secondary | ICD-10-CM | POA: Diagnosis present

## 2021-05-23 DIAGNOSIS — Z6822 Body mass index (BMI) 22.0-22.9, adult: Secondary | ICD-10-CM

## 2021-05-23 DIAGNOSIS — I2111 ST elevation (STEMI) myocardial infarction involving right coronary artery: Secondary | ICD-10-CM

## 2021-05-23 DIAGNOSIS — M1990 Unspecified osteoarthritis, unspecified site: Secondary | ICD-10-CM | POA: Diagnosis not present

## 2021-05-23 DIAGNOSIS — R400 Somnolence: Secondary | ICD-10-CM | POA: Diagnosis not present

## 2021-05-23 DIAGNOSIS — R630 Anorexia: Secondary | ICD-10-CM | POA: Diagnosis not present

## 2021-05-23 LAB — COMPREHENSIVE METABOLIC PANEL
ALT: 14 U/L (ref 0–44)
AST: 25 U/L (ref 15–41)
Albumin: 1.9 g/dL — ABNORMAL LOW (ref 3.5–5.0)
Alkaline Phosphatase: 257 U/L — ABNORMAL HIGH (ref 38–126)
Anion gap: 12 (ref 5–15)
BUN: 23 mg/dL (ref 8–23)
CO2: 19 mmol/L — ABNORMAL LOW (ref 22–32)
Calcium: 7.9 mg/dL — ABNORMAL LOW (ref 8.9–10.3)
Chloride: 100 mmol/L (ref 98–111)
Creatinine, Ser: 1.43 mg/dL — ABNORMAL HIGH (ref 0.61–1.24)
GFR, Estimated: 50 mL/min — ABNORMAL LOW (ref >60)
Glucose, Bld: 142 mg/dL — ABNORMAL HIGH (ref 70–99)
Potassium: 3.6 mmol/L (ref 3.5–5.1)
Sodium: 131 mmol/L — ABNORMAL LOW (ref 135–145)
Total Bilirubin: 1 mg/dL (ref 0.3–1.2)
Total Protein: 4.8 g/dL — ABNORMAL LOW (ref 6.5–8.1)

## 2021-05-23 LAB — CBC WITH DIFFERENTIAL/PLATELET
Abs Immature Granulocytes: 0.2 10*3/uL — ABNORMAL HIGH (ref 0.00–0.07)
Basophils Absolute: 0 10*3/uL (ref 0.0–0.1)
Basophils Relative: 0 %
Eosinophils Absolute: 0 10*3/uL (ref 0.0–0.5)
Eosinophils Relative: 0 %
HCT: 22.7 % — ABNORMAL LOW (ref 39.0–52.0)
Hemoglobin: 7.5 g/dL — ABNORMAL LOW (ref 13.0–17.0)
Immature Granulocytes: 1 %
Lymphocytes Relative: 1 %
Lymphs Abs: 0.3 10*3/uL — ABNORMAL LOW (ref 0.7–4.0)
MCH: 31.1 pg (ref 26.0–34.0)
MCHC: 33 g/dL (ref 30.0–36.0)
MCV: 94.2 fL (ref 80.0–100.0)
Monocytes Absolute: 0.9 10*3/uL (ref 0.1–1.0)
Monocytes Relative: 4 %
Neutro Abs: 20.8 10*3/uL — ABNORMAL HIGH (ref 1.7–7.7)
Neutrophils Relative %: 94 %
Platelets: 301 10*3/uL (ref 150–400)
RBC: 2.41 MIL/uL — ABNORMAL LOW (ref 4.22–5.81)
RDW: 15.8 % — ABNORMAL HIGH (ref 11.5–15.5)
WBC: 22.2 10*3/uL — ABNORMAL HIGH (ref 4.0–10.5)
nRBC: 0 % (ref 0.0–0.2)

## 2021-05-23 LAB — HEMOGLOBIN AND HEMATOCRIT, BLOOD
HCT: 29 % — ABNORMAL LOW (ref 39.0–52.0)
Hemoglobin: 8.9 g/dL — ABNORMAL LOW (ref 13.0–17.0)

## 2021-05-23 LAB — RESP PANEL BY RT-PCR (FLU A&B, COVID) ARPGX2
Influenza A by PCR: NEGATIVE
Influenza B by PCR: NEGATIVE
SARS Coronavirus 2 by RT PCR: NEGATIVE

## 2021-05-23 LAB — I-STAT CHEM 8, ED
BUN: 20 mg/dL (ref 8–23)
Calcium, Ion: 1.11 mmol/L — ABNORMAL LOW (ref 1.15–1.40)
Chloride: 98 mmol/L (ref 98–111)
Creatinine, Ser: 1.3 mg/dL — ABNORMAL HIGH (ref 0.61–1.24)
Glucose, Bld: 138 mg/dL — ABNORMAL HIGH (ref 70–99)
HCT: 23 % — ABNORMAL LOW (ref 39.0–52.0)
Hemoglobin: 7.8 g/dL — ABNORMAL LOW (ref 13.0–17.0)
Potassium: 3.6 mmol/L (ref 3.5–5.1)
Sodium: 131 mmol/L — ABNORMAL LOW (ref 135–145)
TCO2: 18 mmol/L — ABNORMAL LOW (ref 22–32)

## 2021-05-23 LAB — APTT: aPTT: 36 seconds (ref 24–36)

## 2021-05-23 LAB — LIPID PANEL
Cholesterol: 59 mg/dL (ref 0–200)
HDL: 35 mg/dL — ABNORMAL LOW (ref >40)
LDL Cholesterol: 11 mg/dL (ref 0–99)
Total CHOL/HDL Ratio: 1.7 RATIO
Triglycerides: 64 mg/dL (ref <150)
VLDL: 13 mg/dL (ref 0–40)

## 2021-05-23 LAB — TROPONIN I (HIGH SENSITIVITY)
Troponin I (High Sensitivity): 103 ng/L (ref <18)
Troponin I (High Sensitivity): 2328 ng/L (ref <18)

## 2021-05-23 LAB — HEMOGLOBIN A1C
Hgb A1c MFr Bld: 6 % — ABNORMAL HIGH (ref 4.8–5.6)
Mean Plasma Glucose: 125.5 mg/dL

## 2021-05-23 LAB — PROTIME-INR
INR: 1.4 — ABNORMAL HIGH (ref 0.8–1.2)
Prothrombin Time: 17.5 seconds — ABNORMAL HIGH (ref 11.4–15.2)

## 2021-05-23 LAB — LIPASE, BLOOD: Lipase: 18 U/L (ref 11–51)

## 2021-05-23 LAB — BRAIN NATRIURETIC PEPTIDE: B Natriuretic Peptide: 307.7 pg/mL — ABNORMAL HIGH (ref 0.0–100.0)

## 2021-05-23 LAB — PREPARE RBC (CROSSMATCH)

## 2021-05-23 LAB — AMMONIA: Ammonia: 20 umol/L (ref 9–35)

## 2021-05-23 LAB — CBG MONITORING, ED: Glucose-Capillary: 146 mg/dL — ABNORMAL HIGH (ref 70–99)

## 2021-05-23 LAB — LACTIC ACID, PLASMA: Lactic Acid, Venous: 4 mmol/L (ref 0.5–1.9)

## 2021-05-23 SURGERY — CORONARY/GRAFT ACUTE MI REVASCULARIZATION
Anesthesia: LOCAL

## 2021-05-23 MED ORDER — SODIUM CHLORIDE 0.9 % IV BOLUS
500.0000 mL | Freq: Once | INTRAVENOUS | Status: DC
Start: 2021-05-23 — End: 2021-05-24

## 2021-05-23 MED ORDER — HEPARIN (PORCINE) IN NACL 1000-0.9 UT/500ML-% IV SOLN
INTRAVENOUS | Status: AC
  Filled 2021-05-23: qty 500

## 2021-05-23 MED ORDER — ASPIRIN 300 MG RE SUPP
300.0000 mg | Freq: Once | RECTAL | Status: AC
  Administered 2021-05-23: 300 mg via RECTAL
  Filled 2021-05-23: qty 1

## 2021-05-23 MED ORDER — VERAPAMIL HCL 2.5 MG/ML IV SOLN
INTRAVENOUS | Status: AC
  Filled 2021-05-23: qty 2

## 2021-05-23 MED ORDER — IOHEXOL 350 MG/ML SOLN
100.0000 mL | Freq: Once | INTRAVENOUS | Status: AC | PRN
  Administered 2021-05-23: 100 mL via INTRAVENOUS

## 2021-05-23 MED ORDER — LORAZEPAM 1 MG PO TABS: 0.5000 mg | ORAL_TABLET | ORAL | 0 refills | Status: AC | PRN

## 2021-05-23 MED ORDER — SODIUM CHLORIDE 0.9 % IV SOLN
INTRAVENOUS | Status: DC
  Administered 2021-05-23: 20 mL/h via INTRAVENOUS

## 2021-05-23 MED ORDER — SODIUM CHLORIDE 0.9 % IV BOLUS
1000.0000 mL | Freq: Once | INTRAVENOUS | Status: AC
  Administered 2021-05-23: 1000 mL via INTRAVENOUS

## 2021-05-23 MED ORDER — OXYCODONE HCL 5 MG PO TABS: 5.0000 mg | ORAL_TABLET | ORAL | 0 refills | Status: AC | PRN

## 2021-05-23 MED ORDER — ONDANSETRON HCL 4 MG/2ML IJ SOLN
4.0000 mg | Freq: Four times a day (QID) | INTRAMUSCULAR | Status: DC | PRN
  Administered 2021-05-24: 4 mg via INTRAVENOUS
  Filled 2021-05-23: qty 2

## 2021-05-23 MED ORDER — MORPHINE SULFATE (PF) 2 MG/ML IV SOLN
1.0000 mg | INTRAVENOUS | Status: DC | PRN
Start: 2021-05-23 — End: 2021-05-24
  Administered 2021-05-23 – 2021-05-24 (×4): 1 mg via INTRAVENOUS
  Filled 2021-05-23 (×4): qty 1

## 2021-05-23 MED ORDER — INSULIN ASPART 100 UNIT/ML IJ SOLN: 0.0000 [IU] | INTRAMUSCULAR | Status: DC

## 2021-05-23 MED ORDER — NOREPINEPHRINE 4 MG/250ML-% IV SOLN
2.0000 ug/min | INTRAVENOUS | Status: DC
  Administered 2021-05-23: 5 ug/min via INTRAVENOUS

## 2021-05-23 MED ORDER — POLYETHYLENE GLYCOL 3350 17 G PO PACK: 17.0000 g | PACK | Freq: Every day | ORAL | Status: DC | PRN

## 2021-05-23 MED ORDER — PANTOPRAZOLE SODIUM 40 MG IV SOLR
40.0000 mg | Freq: Two times a day (BID) | INTRAVENOUS | Status: DC
  Administered 2021-05-23: 40 mg via INTRAVENOUS
  Filled 2021-05-23: qty 40

## 2021-05-23 MED ORDER — SODIUM CHLORIDE 0.9% IV SOLUTION: Freq: Once | INTRAVENOUS | Status: AC

## 2021-05-23 MED ORDER — NOREPINEPHRINE 4 MG/250ML-% IV SOLN
0.0000 ug/min | INTRAVENOUS | Status: DC
Start: 2021-05-23 — End: 2021-05-23
  Administered 2021-05-23: 5 ug/min via INTRAVENOUS
  Filled 2021-05-23: qty 250

## 2021-05-23 MED ORDER — LIDOCAINE HCL (PF) 1 % IJ SOLN
INTRAMUSCULAR | Status: AC
  Filled 2021-05-23: qty 30

## 2021-05-23 MED ORDER — DOCUSATE SODIUM 100 MG PO CAPS
100.0000 mg | ORAL_CAPSULE | Freq: Two times a day (BID) | ORAL | Status: DC | PRN
  Filled 2021-05-23: qty 1

## 2021-05-23 MED ORDER — HEPARIN SODIUM (PORCINE) 1000 UNIT/ML IJ SOLN
INTRAMUSCULAR | Status: AC
  Filled 2021-05-23: qty 1

## 2021-05-23 MED ORDER — SODIUM CHLORIDE 0.9 % IV SOLN: 250.0000 mL | INTRAVENOUS | Status: DC

## 2021-05-23 NOTE — Progress Notes (Signed)
Paged for STEMI activation and transfer via EMS.  EMS arrived to home after family called for generalized weakness and fall.  On their assessment they found him to be altered with hypotension BP 80/48 and inferior STEMI on rhythm strip.  He was transferred to ED for further evaluation and given 1 L IV fluids in route.  On arrival patient was chronically ill-appearing, cachectic, and intermittently responding but was oriented to self and place.  He denied any chest pain but was reporting moderate severity abdominal pain without distention.  He did not remember what happened leading up to this episode.  BP was normotensive and responded to IV fluid bolus.  All other vital signs were otherwise unremarkable on arrival.  He has history of pancreatic cancer status post Whipple and adjuvant chemotherapy 4 years ago and then was hospitalized twice in the past month for upper GI bleed for which she was taken off Plavix and had to undergo repeat EGD.  He had initial EGD revealing anastomotic bleeding requiring epi injection and clip placement for which she was taken off Plavix and discharged home on 08/22.  He had recurrent rectal bleeding on 08/27 and 08/28 along with hypotension requiring APC and blood transfusion.  Wife reported that tonight he woke up from bed shaking which she has had happen before, and then he went to the restroom however she went to help him with the restroom and he was extremely weak and fell over off the toilet.  He did not report to her any chest pain or other anginal equivalents.  Current lab work that has resulted with hemoglobin 7.8 and mildly AKI with sCr (1.3).  All other lab work is pending.  He was sent for CT had given the AMS and CT of abdomen section protocol given his abdominal pain.  CT head showed a left forehead scalp hematoma but no underlying fracture and otherwise no significant changes.  CT abdomen read pending.  Unfortunately Manuel Olson is extremely debilitated  especially in the context of his recent hospitalizations for recurrent GI bleed for which she could not tolerate DAPT.  His presentation is most likely consistent with primary ACS and inferior STEMI.  Given his extremely poor functional status and issues tolerating DAPT I do not think that we are improving his quality of life by putting him through an emergency cardiac catheterization.  We discussed with his wife that he likely may pass during this hospitalization.  She understands the critical situation and dilemma we are facing and was in support of a comfort oriented approach.  We will await the final CT read however not planning on pursuing invasive coronary assessment and would focus on comfort status.  Would not heparinize or treat with P2 Y 12.  Can continue daily aspirin which she has been on after his GI bleed.  Would consult medicine for palliative admission.   Formal consult note to follow.

## 2021-05-23 NOTE — H&P (Addendum)
NAME:  Manuel Olson MRN:  [id number] DOB:  26-Aug-1946 LOS: 0 ADMISSION DATE:  05/23/2021 DATE OF SERVICE:  05/23/2021  CHIEF COMPLAINT:  near syncope   HISTORY & PHYSICAL  History of Present Illness  This 75 y.o. Caucasian male reformed smoker presented to the [facility] Emergency Department via EMS after a near ysncopal episode in the bathroom.  He was found by his wife to have altered mental status (confusion and somnolence).  On arrival to ER, he was found to be hypotensive and is currently on norepinephrine infusion for blood pressure support.  CODE STEMI was called, and he has been seen by the Cardiology service; however, he has been deemed to be at unacceptably high risk of complications and adverse outcome of intervention.  In fact, he is unable to tolerate antithrombotic/antiplatelet therapy due to recent GI bleed.  He reported abdominal pain on arrival, which has apparently improved.  He denied chest pain.  EKG was compatible with acute inferoposterior wall myocardial infarction.   REVIEW OF SYSTEMS This patient is critically ill and cannot provide additional history nor review of systems due to mental status/unconsciousness and very hard of hearing .   Past Medical/Surgical/Social/Family History   Past Medical History:  Diagnosis Date   Arthritis    back   Blood transfusion without reported diagnosis    CAD (coronary artery disease)    a. 11/2016 in [place]: STEMI s/p DES to mid LAD, DES to diagonal, DES x 2 to proximal and mid RCA b. 08/2019: NSTEMI with DES to distal RCA   Essential hypertension    Full dentures    GERD (gastroesophageal reflux disease)    History of stroke    Hyperlipidemia    Myocardial infarction [facility])    Pancreatic cancer ([place])    Pneumonia    Renal cell carcinoma ([place])    S/P TAVR (transcatheter aortic valve replacement)    a. 08/2017:  Edwards Sapien 3 THV (size 26 mm, model #9600CM26A , serial # [id number])   Severe aortic  stenosis    a. 08/2017: s/p TAVR by Dr. Angelena Form and Dr. Cyndia Bent. 2021 NO AVS on echo   Type 2 diabetes mellitus ([place])    Wears glasses    Wears hearing aid     Past Surgical History:  Procedure Laterality Date   APPENDECTOMY     BIOPSY  05/11/2021   Procedure: BIOPSY;  Surgeon: Doran Stabler, MD;  Location[facility];  Service: Gastroenterology;;   CARDIAC CATHETERIZATION     CORONARY ANGIOGRAPHY N/A 08/23/2019   Procedure: CORONARY ANGIOGRAPHY;  Surgeon: Belva Crome, MD;  Location: [facility];  Service: Cardiovascular;  Laterality: N/A;   CORONARY STENT INTERVENTION N/A 08/23/2019   Procedure: CORONARY STENT INTERVENTION;  Surgeon: Belva Crome, MD;  Location: [place] [facility];  Service: Cardiovascular;  Laterality: N/A;   CORONARY STENT PLACEMENT     x 4 in March 2018   ESOPHAGOGASTRODUODENOSCOPY (EGD) WITH PROPOFOL N/A 01/02/2020   Procedure: ESOPHAGOGASTRODUODENOSCOPY (EGD) WITH PROPOFOL;  Surgeon: Jackquline Denmark, MD;  [facility];  Service: Endoscopy;  Laterality: N/A;   ESOPHAGOGASTRODUODENOSCOPY (EGD) WITH PROPOFOL N/A 04/29/2021   Procedure: ESOPHAGOGASTRODUODENOSCOPY (EGD) WITH PROPOFOL;  Surgeon: Jackquline Denmark, MD;  [facility];  Service: Endoscopy;  Laterality: N/A;   ESOPHAGOGASTRODUODENOSCOPY (EGD) WITH PROPOFOL N/A 05/11/2021   Procedure: ESOPHAGOGASTRODUODENOSCOPY (EGD) WITH PROPOFOL;  Surgeon: Doran Stabler, MD;  [facility];  Service: Gastroenterology;  Laterality:  N/A;   EUS N/A 08/02/2017   Procedure: UPPER ENDOSCOPIC ULTRASOUND (EUS) RADIAL;  Surgeon: [name], MD;  [facility];  Service: Endoscopy;  Laterality: N/A;   EYE SURGERY Left    HAND SURGERY Bilateral    HEMOSTASIS CLIP PLACEMENT  01/02/2020   Procedure: HEMOSTASIS CLIP PLACEMENT;  Surgeon: Jackquline Denmark, MD;  [facility];  Service: Endoscopy;;   HEMOSTASIS CLIP PLACEMENT  04/29/2021   Procedure: HEMOSTASIS CLIP PLACEMENT;   Surgeon: Jackquline Denmark, MD;  [facility];  Service: Endoscopy;;   HERNIA REPAIR Right    HOT HEMOSTASIS N/A 01/02/2020   Procedure: HOT HEMOSTASIS (ARGON PLASMA COAGULATION/BICAP);  Surgeon: Jackquline Denmark, MD;  Location: [facility];  Service: Endoscopy;  Laterality: N/A;   HOT HEMOSTASIS N/A 05/11/2021   Procedure: HOT HEMOSTASIS (ARGON PLASMA COAGULATION/BICAP);  Surgeon: Doran Stabler, MD;  Location: [facility];  Service: Gastroenterology;  Laterality: N/A;   LAPAROSCOPY N/A 02/19/2018   Procedure: DIAGNOSTIC LAPAROSCOPY, ERAS PATHWAY;  Surgeon: [name], MD;  Location: [place];  Service: General;  Laterality: N/A;   MULTIPLE TOOTH EXTRACTIONS     NEPHRECTOMY Left 02/19/2018   Procedure: OPEN LEFT RADICAL NEPHRECTOMY;  Surgeon: Cleon Gustin, MD;  Location: [place];  Service: Urology;  Laterality: Left;   PORT-A-CATH REMOVAL N/A 06/13/2019   Procedure: PORT REMOVAL;  Surgeon: [name], MD;  Location: [place];  Service: General;  Laterality: N/A;   PORTACATH PLACEMENT N/A 09/15/2017   Procedure: INSERTION PORT-A-CATH;  Surgeon: [name], MD;  Location: [place];  Service: General;  Laterality: N/A;   RIGHT/LEFT HEART CATH AND CORONARY ANGIOGRAPHY N/A 07/05/2017   Procedure: RIGHT/LEFT HEART CATH AND CORONARY ANGIOGRAPHY;  Surgeon: [name], MD;  Location: [place] [facility];  Service: Cardiovascular;  Laterality: N/A;   SCLEROTHERAPY  01/02/2020   Procedure: SCLEROTHERAPY;  Surgeon: Jackquline Denmark, MD;  Location[facility];  Service: Endoscopy;;   SCLEROTHERAPY  04/29/2021   Procedure: Clide Deutscher;  Surgeon: Jackquline Denmark, MD;  [facility];  Service: Endoscopy;;   SHOULDER ARTHROSCOPY WITH ROTATOR CUFF REPAIR Left    TEE WITHOUT CARDIOVERSION N/A 08/22/2017   Procedure: TRANSESOPHAGEAL ECHOCARDIOGRAM (TEE);  Surgeon: Burnell Blanks, MD;  Location: [place];  Service: Open Heart Surgery;  Laterality: N/A;   TRANSCATHETER  AORTIC VALVE REPLACEMENT, TRANSFEMORAL N/A 08/22/2017   Procedure: TRANSCATHETER AORTIC VALVE REPLACEMENT, TRANSFEMORAL;  Surgeon: Burnell Blanks, MD;  Location: [place];  Service: Open Heart Surgery;  Laterality: N/A;   UPPER GASTROINTESTINAL ENDOSCOPY     WHIPPLE PROCEDURE N/A 02/19/2018   Procedure: WHIPPLE PROCEDURE;  Surgeon: [name], MD;  Location: [place];  Service: General;  Laterality: N/A;    Social History   Tobacco Use   Smoking status: Former    Packs/day: 1.00    Years: 57.00    Pack years: 57.00    Types: Cigarettes    Start date: 09/12/1958    Quit date: 04/12/2016    Years since quitting: 5.1   Smokeless tobacco: Never  Substance Use Topics   Alcohol use: No    Family History  Problem Relation Age of Onset   Lupus Mother    CAD Brother    Hypertension Brother    Colon cancer Neg Hx    Esophageal cancer Neg Hx    Rectal cancer Neg Hx    Stomach cancer Neg Hx     Procedures:    Significant Diagnostic Tests:    Micro Data:  Results for orders placed or performed during the hospital encounter of 05/23/21  Resp Panel by RT-PCR (Flu A&B, Covid) Nasopharyngeal Swab     Status: None   Collection Time: 05/23/21  4:05 AM   Specimen: Nasopharyngeal Swab; Nasopharyngeal(NP) swabs in vial transport medium  Result Value Ref Range Status   SARS Coronavirus 2 by RT PCR NEGATIVE NEGATIVE Final    Comment: (NOTE) SARS-CoV-2 target nucleic acids are NOT DETECTED.  The SARS-CoV-2 RNA is generally detectable in upper respiratory specimens during the acute phase of infection. The lowest concentration of SARS-CoV-2 viral copies this assay can detect is 138 copies/mL. A negative result does not preclude SARS-Cov-2 infection and should not be used as the sole basis for treatment or other patient management decisions. A negative result may occur with  improper specimen collection/handling, submission of specimen other than nasopharyngeal swab, presence of viral  mutation(s) within the areas targeted by this assay, and inadequate number of viral copies(<138 copies/mL). A negative result must be combined with clinical observations, patient history, and epidemiological information. The expected result is Negative.  Fact Sheet for Patients:  EntrepreneurPulse.com.au  Fact Sheet for Healthcare Providers:  IncredibleEmployment.be  This test is no t yet approved or cleared by the [place] FDA and  has been authorized for detection and/or diagnosis of SARS-CoV-2 by FDA under an Emergency Use Authorization (EUA). This EUA will remain  in effect (meaning this test can be used) for the duration of the COVID-19 declaration under Section 564(b)(1) of the Act, 21 U.S.C.section 360bbb-3(b)(1), unless the authorization is terminated  or revoked sooner.       Influenza A by PCR NEGATIVE NEGATIVE Final   Influenza B by PCR NEGATIVE NEGATIVE Final    Comment: (NOTE) The Xpert Xpress SARS-CoV-2/FLU/RSV plus assay is intended as an aid in the diagnosis of influenza from Nasopharyngeal swab specimens and should not be used as a sole basis for treatment. Nasal washings and aspirates are unacceptable for Xpert Xpress SARS-CoV-2/FLU/RSV testing.  Fact Sheet for Patients: EntrepreneurPulse.com.au  Fact Sheet for Healthcare Providers: IncredibleEmployment.be  This test is not yet approved or cleared by the [place] FDA and has been authorized for detection and/or diagnosis of SARS-CoV-2 by FDA under an Emergency Use Authorization (EUA). This EUA will remain in effect (meaning this test can be used) for the duration of the COVID-19 declaration under Section 564(b)(1) of the Act, 21 U.S.C. section 360bbb-3(b)(1), unless the authorization is terminated or revoked.  Performed at [facility], [place] [address], [place], [place] [address]      Antimicrobials:    Interim  history/subjective:    Objective   BP (!) 140/57 (BP Location: Left Arm)   Pulse 85   Temp 98.2 F (36.8 C) (Temporal)   Resp 18   Ht 5' 9"  (1.753 m)   Wt 70 kg   SpO2 99%   BMI 22.79 kg/m     Filed Weights   05/23/21 0359  Weight: 70 kg   No intake or output data in the 24 hours ending 05/23/21 0552      Examination: GENERAL:  pale, lethargic/drowsy, cachectic. No acute distress. HEAD: normocephalic, atraumatic EYE: PERRLA, EOM intact, no scleral icterus, no pallor. THROAT/ORAL CAVITY: Normal dentition. No oral thrush. No exudate. Mucous membranes are dry. No tonsillar enlargement.   NECK: supple, no thyromegaly, no JVD, no lymphadenopathy. Trachea midline. CHEST/LUNG: symmetric in development and expansion. Good air entry. No crackles. No wheezes. HEART: Regular S1 and S2 without  murmur, rub or gallop. ABDOMEN: soft, nontender, nondistended. Normoactive bowel sounds. No rebound. No guarding. No hepatosplenomegaly. EXTREMITIES: Edema: pedal edema. No cyanosis. No clubbing. 2+ DP pulses LYMPHATIC: no cervical/axillary/inguinal lymph nodes appreciated MUSCULOSKELETAL: No point tenderness. Bulk atrophy. Joints: normal inspection.  SKIN:  No rash or lesion. NEUROLOGIC: Doll's eyes intact. Corneal reflex intact. Spontaneous respirations intact. Cranial nerves II-XII are grossly symmetric and physiologic. Babinski absent. No sensory deficit. Motor: 5/5 @ RUE, 5/5 @ LUE, 5/5 @ RLL,  5/5 @ LLL.  DTR: 2+ @ R biceps, 2+ @ L biceps, 2+ @ R patellar,  2+ @ L patellar. No cerebellar signs. Gait was not assessed.   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   Acute blood loss anemia   Elevated brain natriuretic peptide (BNP)  level   CKD (chronic kidney disease), stage III (HCC)   Severe AS S/P TAVR    Type II diabetes with long term use of insulin (HCC)   Exocrine pancreatic insufficiency   Altered mental status   By systems: PULMONARY: No acute issues Supplemental oxygen as needed to maintain SpO2 93+%   CARDIOVASCULAR Cardiogenic shock secondary to STEMI STEMI with EKG changes suggestive of inferoposterior ischemia Elevated BNP S/P TAVR 1 U PRBC currentlyy hanging In light of the possibility of acute RV infarct/failure, IV fluid boluses may provide hemodynamic improvement Titrate norepinephrine for MAP 65+ Cardiology input appreciated: this patient is NOT a candidate for percutaneous coronary intervention or antiplatel/antithrombotic therapy at this time. Beta blockade and ACE-I/ARB are contraindicated at this time due to hypotensive state Start Lipitor when able to take PO meds   RENAL CKD, stage IIIA H/O renal call carcinoma s/p R nephrectomy (2019) Maintain K 4, Mg 2   GASTROINTESTINAL Pancreatic exocrine insufficiency Recent GI bleed (1 week ago), localized to anastomotic sites from previous Whipple procedure H/O pancreatic cancer s/p Whipple (4 years ago) Gastroccult/Hemoccult On Creon at home.  Hold for now. GI PROPHYLAXIS: Protonix   HEMATOLOGIC Acute blood loss anemia Agree with transfusion DVT PROPHYLAXIS: SCDs for now   INFECTIOUS: No acute issues   ENDOCRINE Type 2 diabetes Sliding scale insulin for now   NEUROLOGIC Altered mental status Monitor neurologic status   PLAN/RECOMMENDATIONS  Admit to ICU under my service (Attending: Renee Pain, MD) with the diagnoses highlighted above in the active Hospital Problem List (ASSESSMENT). Palliative care consultation is warranted and will be requested. At this time, the patient remains FULL CODE.  The patient's wife is awaiting the arrival of the daughter (lives locally) to discuss goals of care and possible change in [place].     My assessment, plan of care, findings, medications, side effects, etc. were discussed with: nurse and Dr. [name] (Emergency Medicine).   Best practice:  Diet: NPO for now Pain/Anxiety/Delirium protocol (if indicated): N/A VAP protocol (if indicated): N/A DVT prophylaxis: SCDs GI prophylaxis: Protonix Glucose control: sliding scale insulin Mobility/Activity: bed rest   Code Status: DNR  The patient's wife and daughter expressed their opinion that the patient would NEVER want to be intubated and that no ACLS/CPR should be provided in the event of cardiac arrest.  They feel that to put the patient through such heroic measures would be selfish and inconsistent with  the patient's wishes. Family Communication:   patient's wife updated at bedside Disposition: admit to ICU   Labs   CBC: Recent Labs  Lab 05/19/21 1318 05/23/21 0406 05/23/21 0418  WBC 12.0* 22.2*  --   NEUTROABS 10.3* 20.8*  --   HGB 8.4 Repeated and verified X2.* 7.5* 7.8*  HCT 25.6 Repeated and verified X2.* 22.7* 23.0*  MCV 91.1 94.2  --   PLT 171.0 301  --     Basic Metabolic Panel: Recent Labs  Lab 05/23/21 0406 05/23/21 0418  NA 131* 131*  K 3.6 3.6  CL 100 98  CO2 19*  --   GLUCOSE 142* 138*  BUN 23 20  CREATININE 1.43* 1.30*  CALCIUM 7.9*  --    GFR: Estimated Creatinine Clearance: 47.1 mL/min (A) (by C-G formula based on SCr of 1.3 mg/dL (H)). Recent Labs  Lab 05/19/21 1318 05/23/21 0406 05/23/21 0428  WBC 12.0* 22.2*  --   LATICACIDVEN  --   --  4.0*    Liver Function Tests: Recent Labs  Lab 05/23/21 0406  AST 25  ALT 14  ALKPHOS 257*  BILITOT 1.0  PROT 4.8*  ALBUMIN 1.9*   Recent Labs  Lab 05/23/21 0406  LIPASE 18   Recent Labs  Lab 05/23/21 0414  AMMONIA 20    ABG    Component Value Date/Time   PHART 7.281 (L) 02/19/2018 1349   PCO2ART 51.1 (H) 02/19/2018 1349   PO2ART 225.0 (H) 02/19/2018 1349   HCO3 24.1 02/19/2018 1349   TCO2 18 (L) 05/23/2021  0418   ACIDBASEDEF 3.0 (H) 02/19/2018 1349   O2SAT 100.0 02/19/2018 1349     Coagulation Profile: Recent Labs  Lab 05/23/21 0406  INR 1.4*    Cardiac Enzymes: No results for input(s): CKTOTAL, CKMB, CKMBINDEX, TROPONINI in the last 168 hours.  HbA1C: Hgb A1c MFr Bld  Date/Time Value Ref Range Status  05/23/2021 04:11 AM 6.0 (H) 4.8 - 5.6 % Final    Comment:    (NOTE) Pre diabetes:          5.7%-6.4%  Diabetes:              >6.4%  Glycemic control for   <7.0% adults with diabetes   03/05/2021 12:37 AM 7.2 (H) 4.8 - 5.6 % Final    Comment:    (NOTE) Pre diabetes:          5.7%-6.4%  Diabetes:              >6.4%  Glycemic control for   <7.0% adults with diabetes     CBG: No results for input(s): GLUCAP in the last 168 hours.   Past Medical History   Past Medical History:  Diagnosis Date   Arthritis    back   Blood transfusion without reported diagnosis    CAD (coronary artery disease)    a. 11/2016 in [place]: STEMI s/p DES to mid LAD, DES to diagonal, DES x 2 to proximal and mid RCA b. 08/2019: NSTEMI with DES to distal RCA   Essential hypertension    Full dentures    GERD (gastroesophageal reflux disease)    History of stroke    Hyperlipidemia    Myocardial infarction [facility])    Pancreatic cancer ([place])    Pneumonia    Renal cell carcinoma ([place])    S/P TAVR (transcatheter aortic valve replacement)    a. 08/2017:  Edwards Sapien 3 THV (size 26 mm, model #9600CM26A , serial #  [id number])   Severe aortic stenosis    a. 08/2017: s/p TAVR by Dr. Angelena Form and Dr. Cyndia Bent. 2021 NO AVS on echo   Type 2 diabetes mellitus ([place])    Wears glasses    Wears hearing aid       Surgical History    Past Surgical History:  Procedure Laterality Date   APPENDECTOMY     BIOPSY  05/11/2021   Procedure: BIOPSY;  Surgeon: Doran Stabler, MD;  Location[facility];  Service: Gastroenterology;;   CARDIAC CATHETERIZATION     CORONARY ANGIOGRAPHY N/A 08/23/2019   Procedure:  CORONARY ANGIOGRAPHY;  Surgeon: Belva Crome, MD;  Location: [place] [facility];  Service: Cardiovascular;  Laterality: N/A;   CORONARY STENT INTERVENTION N/A 08/23/2019   Procedure: CORONARY STENT INTERVENTION;  Surgeon: Belva Crome, MD;  Location: [place] [facility];  Service: Cardiovascular;  Laterality: N/A;   CORONARY STENT PLACEMENT     x 4 in March 2018   ESOPHAGOGASTRODUODENOSCOPY (EGD) WITH PROPOFOL N/A 01/02/2020   Procedure: ESOPHAGOGASTRODUODENOSCOPY (EGD) WITH PROPOFOL;  Surgeon: Jackquline Denmark, MD;  [facility];  Service: Endoscopy;  Laterality: N/A;   ESOPHAGOGASTRODUODENOSCOPY (EGD) WITH PROPOFOL N/A 04/29/2021   Procedure: ESOPHAGOGASTRODUODENOSCOPY (EGD) WITH PROPOFOL;  Surgeon: Jackquline Denmark, MD;  [facility];  Service: Endoscopy;  Laterality: N/A;   ESOPHAGOGASTRODUODENOSCOPY (EGD) WITH PROPOFOL N/A 05/11/2021   Procedure: ESOPHAGOGASTRODUODENOSCOPY (EGD) WITH PROPOFOL;  Surgeon: Doran Stabler, MD;  [facility];  Service: Gastroenterology;  Laterality: N/A;   EUS N/A 08/02/2017   Procedure: UPPER ENDOSCOPIC ULTRASOUND (EUS) RADIAL;  Surgeon: [name], MD;  [facility];  Service: Endoscopy;  Laterality: N/A;   EYE SURGERY Left    HAND SURGERY Bilateral    HEMOSTASIS CLIP PLACEMENT  01/02/2020   Procedure: HEMOSTASIS CLIP PLACEMENT;  Surgeon: Jackquline Denmark, MD;  [facility];  Service: Endoscopy;;   HEMOSTASIS CLIP PLACEMENT  04/29/2021   Procedure: HEMOSTASIS CLIP PLACEMENT;  Surgeon: Jackquline Denmark, MD;  [facility];  Service: Endoscopy;;   HERNIA REPAIR Right    HOT HEMOSTASIS N/A 01/02/2020   Procedure: HOT HEMOSTASIS (ARGON PLASMA COAGULATION/BICAP);  Surgeon: Jackquline Denmark, MD;  [facility];  Service: Endoscopy;  Laterality: N/A;   HOT HEMOSTASIS N/A 05/11/2021   Procedure: HOT HEMOSTASIS (ARGON PLASMA COAGULATION/BICAP);  Surgeon: Doran Stabler, MD;  [facility];   Service: Gastroenterology;  Laterality: N/A;   LAPAROSCOPY N/A 02/19/2018   Procedure: DIAGNOSTIC LAPAROSCOPY, ERAS PATHWAY;  Surgeon: [name], MD;  Location: [place];  Service: General;  Laterality: N/A;   MULTIPLE TOOTH EXTRACTIONS     NEPHRECTOMY Left 02/19/2018   Procedure: OPEN LEFT RADICAL NEPHRECTOMY;  Surgeon: Cleon Gustin, MD;  Location: [place];  Service: Urology;  Laterality: Left;   PORT-A-CATH REMOVAL N/A 06/13/2019   Procedure: PORT REMOVAL;  Surgeon: [name], MD;  Location: [place];  Service: General;  Laterality: N/A;   PORTACATH PLACEMENT N/A 09/15/2017   Procedure: INSERTION PORT-A-CATH;  Surgeon: [name], MD;  Location: [place];  Service: General;  Laterality: N/A;   RIGHT/LEFT HEART CATH AND CORONARY ANGIOGRAPHY N/A 07/05/2017   Procedure: RIGHT/LEFT HEART CATH AND CORONARY ANGIOGRAPHY;  Surgeon: [name], MD;  Location: [place] [facility];  Service: Cardiovascular;  Laterality: N/A;   SCLEROTHERAPY  01/02/2020   Procedure: Clide Deutscher;  Surgeon: Jackquline Denmark, MD;  [facility];  Service: Endoscopy;;   SCLEROTHERAPY  04/29/2021   Procedure:  SCLEROTHERAPY;  Surgeon: Jackquline Denmark, MD;  [facility];  Service: Endoscopy;;   SHOULDER ARTHROSCOPY WITH ROTATOR CUFF REPAIR Left    TEE WITHOUT CARDIOVERSION N/A 08/22/2017   Procedure: TRANSESOPHAGEAL ECHOCARDIOGRAM (TEE);  Surgeon: Burnell Blanks, MD;  Location: [place];  Service: Open Heart Surgery;  Laterality: N/A;   TRANSCATHETER AORTIC VALVE REPLACEMENT, TRANSFEMORAL N/A 08/22/2017   Procedure: TRANSCATHETER AORTIC VALVE REPLACEMENT, TRANSFEMORAL;  Surgeon: Burnell Blanks, MD;  Location: [place];  Service: Open Heart Surgery;  Laterality: N/A;   UPPER GASTROINTESTINAL ENDOSCOPY     WHIPPLE PROCEDURE N/A 02/19/2018   Procedure: WHIPPLE PROCEDURE;  Surgeon: [name], MD;  Location: [place];  Service: General;  Laterality: N/A;      Social History    Social History   Socioeconomic History   Marital status: Married    Spouse name: Not on file   Number of children: Not on file   Years of education: Not on file   Highest education level: Not on file  Occupational History   Not on file  Tobacco Use   Smoking status: Former    Packs/day: 1.00    Years: 57.00    Pack years: 57.00    Types: Cigarettes    Start date: 09/12/1958    Quit date: 04/12/2016    Years since quitting: 5.1   Smokeless tobacco: Never  Vaping Use   Vaping Use: Never used  Substance and Sexual Activity   Alcohol use: No   Drug use: No   Sexual activity: Yes  Other Topics Concern   Not on file  Social History Narrative   Not on file   Social Determinants of Health   Financial Resource Strain: Not on file  Food Insecurity: No Food Insecurity   Worried About Running Out of Food in the Last Year: Never true   [place] in the Last Year: Never true  Transportation Needs: No Transportation Needs   Lack of Transportation (Medical): No   Lack of Transportation (Non-Medical): No  Physical Activity: Not on file  Stress: Not on file  Social Connections: Not on file      Family History    Family History  Problem Relation Age of Onset   Lupus Mother    CAD Brother    Hypertension Brother    Colon cancer Neg Hx    Esophageal cancer Neg Hx    Rectal cancer Neg Hx    Stomach cancer Neg Hx    family history includes CAD in his brother; Hypertension in his brother; Lupus in his mother. There is no history of Colon cancer, Esophageal cancer, Rectal cancer, or Stomach cancer.    Allergies Allergies  Allergen Reactions   Carvedilol Other (See Comments)    "Could not function when taking this"      Current Medications  Current Facility-Administered Medications:    0.9 %  sodium chloride infusion, , Intravenous, Continuous, Rancour, Stephen, MD, Stopped at 05/23/21 0524   norepinephrine (LEVOPHED) 53m in 258mpremix infusion, 0-40 mcg/min,  Intravenous, Continuous, Rancour, Stephen, MD, Last Rate: 18.75 mL/hr at 05/23/21 0525, 5 mcg/min at 05/23/21 0525  Current Outpatient Medications:    acetaminophen (TYLENOL) 325 MG tablet, Take 325-650 mg by mouth every 6 (six) hours as needed for mild pain., Disp: , Rfl:    atorvastatin (LIPITOR) 20 MG tablet, Take 1 tablet (20 mg total) by mouth daily., Disp: , Rfl:    Continuous Blood Gluc Sensor (FREESTYLE LIBRE 14 DAY  SENSOR) MISC, Inject 1 patch into the skin every 14 (fourteen) days., Disp: , Rfl:    diphenoxylate-atropine (LOMOTIL) 2.5-0.025 MG tablet, Take 1 tablet by mouth daily at 2 PM., Disp: 30 tablet, Rfl: 1   feeding supplement (ENSURE ENLIVE / ENSURE PLUS) LIQD, Take 237 mLs by mouth 2 (two) times daily between meals. (Patient taking differently: Take 237 mLs by mouth 2 (two) times daily as needed (feeding suppliment).), Disp: 237 mL, Rfl: 12   Insulin Glargine (BASAGLAR KWIKPEN) 100 UNIT/ML, Inject 8 Units into the skin daily., Disp: , Rfl:    insulin lispro (HUMALOG KWIKPEN) 100 UNIT/ML KwikPen, Inject 5-8 Units into the skin 3 (three) times daily before meals. Inject 5-8 units 3 times a day with meals. (Patient taking differently: Inject 2-3 Units into the skin 3 (three) times daily before meals. Sliding scale), Disp: 15 mL, Rfl: 3   Insulin Pen Needle (PEN NEEDLES) 31G X 6 MM MISC, Use as directed to check blood sugar 4 times a day., Disp: 400 each, Rfl: 2   lipase/protease/amylase (CREON) 36000 UNITS CPEP capsule, Take 2 capsules (72,000 Units total) by mouth 3 (three) times daily before meals. Pt taking 2 tab before meals., Disp: , Rfl:    losartan (COZAAR) 25 MG tablet, Take 0.5 tablets (12.5 mg total) by mouth daily., Disp: 45 tablet, Rfl: 3   Multiple Vitamin (MULTIVITAMIN WITH MINERALS) TABS tablet, Take 1 tablet by mouth daily., Disp: , Rfl:    nitroGLYCERIN (NITROSTAT) 0.4 MG SL tablet, Place 1 tablet (0.4 mg total) under the tongue every 5 (five) minutes x 3 doses as  needed for chest pain (if no relief after 2nd dose, proceed to the ED for an evaluation or call 911)., Disp: 25 tablet, Rfl: 3   pantoprazole (PROTONIX) 40 MG tablet, Take 1 tablet (40 mg total) by mouth 2 (two) times daily., Disp: 60 tablet, Rfl: 1   sucralfate (CARAFATE) 1 g tablet, Take 1 tablet (1 g total) by mouth 4 (four) times daily., Disp: 120 tablet, Rfl: 0   Home Medications  Prior to Admission medications   Medication Sig Start Date End Date Taking? Authorizing Provider  acetaminophen (TYLENOL) 325 MG tablet Take 325-650 mg by mouth every 6 (six) hours as needed for mild pain.    [provider]  atorvastatin (LIPITOR) 20 MG tablet Take 1 tablet (20 mg total) by mouth daily. 05/10/21   Eugenie Filler, MD  Continuous Blood Gluc Sensor (FREESTYLE LIBRE 14 DAY SENSOR) MISC Inject 1 patch into the skin every 14 (fourteen) days.    [provider]  diphenoxylate-atropine (LOMOTIL) 2.5-0.025 MG tablet Take 1 tablet by mouth daily at 2 PM. 05/12/21   Willia Craze, NP  feeding supplement (ENSURE ENLIVE / ENSURE PLUS) LIQD Take 237 mLs by mouth 2 (two) times daily between meals. Patient taking differently: Take 237 mLs by mouth 2 (two) times daily as needed (feeding suppliment). 05/03/21   Eugenie Filler, MD  Insulin Glargine [facility]) 100 UNIT/ML Inject 8 Units into the skin daily.    [provider]  insulin lispro (HUMALOG KWIKPEN) 100 UNIT/ML KwikPen Inject 5-8 Units into the skin 3 (three) times daily before meals. Inject 5-8 units 3 times a day with meals. Patient taking differently: Inject 2-3 Units into the skin 3 (three) times daily before meals. Sliding scale 12/04/20   Philemon Kingdom, MD  Insulin Pen Needle (PEN NEEDLES) 31G X 6 MM MISC Use as directed to check blood sugar 4  times a day. 11/10/20   Philemon Kingdom, MD  lipase/protease/amylase (CREON) 36000 UNITS CPEP capsule Take 2 capsules (72,000 Units total) by mouth 3 (three) times  daily before meals. Pt taking 2 tab before meals. 05/03/21   Eugenie Filler, MD  losartan (COZAAR) 25 MG tablet Take 0.5 tablets (12.5 mg total) by mouth daily. 02/18/21   Strader, Fransisco Hertz, PA-C  Multiple Vitamin (MULTIVITAMIN WITH MINERALS) TABS tablet Take 1 tablet by mouth daily. 05/04/21   Eugenie Filler, MD  nitroGLYCERIN (NITROSTAT) 0.4 MG SL tablet Place 1 tablet (0.4 mg total) under the tongue every 5 (five) minutes x 3 doses as needed for chest pain (if no relief after 2nd dose, proceed to the ED for an evaluation or call 911). 01/15/21   Satira Sark, MD  pantoprazole (PROTONIX) 40 MG tablet Take 1 tablet (40 mg total) by mouth 2 (two) times daily. 05/03/21   Eugenie Filler, MD  sucralfate (CARAFATE) 1 g tablet Take 1 tablet (1 g total) by mouth 4 (four) times daily. 05/12/21 06/11/21  Mercy Riding, MD      Critical care time: 60 minutes.  The treatment and management of the patient's condition was required based on the threat of imminent deterioration. This time reflects time spent by the physician evaluating, providing care and managing the critically ill patient's care. The time was spent at the immediate bedside (or on the same floor/unit and dedicated to this patient's care). Time involved in separately billable procedures is NOT included int he critical care time indicated above. Family meeting and update time may be included above if and only if the patient is unable/incompetent to participate in clinical interview and/or decision making, and the discussion was necessary to determining treatment decisions.   Renee Pain, MD Board Certified by the [employer], [place]

## 2021-05-23 NOTE — ED Notes (Signed)
Admitting ICU MD speaking with patient's family on plan of care .

## 2021-05-23 NOTE — Care Management (Signed)
ED RN Care Manager  spoke with  Gregary Signs Palliative NP concerning patient being discharged with [facility] [facility] tomorrow morning, Hospice nurse is scheduled to come out to patient's home tomorrow morning for start of care. DME has been delivered to patient's home and confirmed by spouse. Patient will need transportation home by [facility] tomorrow,  TOC will need to coordinate transport home in the am.

## 2021-05-23 NOTE — ED Triage Notes (Signed)
Patient arrived with EMS from home found on the floor inside bathroom by family with altered LOC , and hypotension 80/48 , STEMI activation , cardiologist evaluated pt. at arrival . No chest pain at arrival.

## 2021-05-23 NOTE — ED Notes (Signed)
Patient's spouse signed consent form for blood transfusion .

## 2021-05-23 NOTE — Progress Notes (Signed)
Interventional cardiology note: The patient is known to me from encounters in 2018 when he underwent TAVR for treatment of severe symptomatic aortic stenosis.  He has a history of pancreatic cancer status post Whipple procedure.  He presents tonight with profound weakness after his wife found him collapsed in the bathroom.  EMS evaluated him onsite and an EKG showed acute inferoposterior STEMI pattern.  He was transferred to [facility] as a code STEMI patient.  Prior to my arrival, the patient had complained of abdominal discomfort and there was concern about an acute intra-abdominal process.  He has been sent for emergent CT angiography of the abdomen and pelvis and is now just returned from radiology.  Dr. Renella Cunas, cardiology fellow, has extensively evaluated the patient.  Please see his detailed consultation note for full details of our evaluation.  On my exam this evening, the patient is an elderly, very cachectic male in no acute distress.  Blood pressure is 80/50, heart rate is in the 60s.  Heart is regular rate and rhythm with distant heart sounds, lungs are clear, abdomen is soft with mild diffuse tenderness and no rebound or guarding, there is 2+ pretibial edema on the left and 1+ edema on the right.  EKG shows sinus rhythm 66 bpm, nonspecific IVCD, and acute inferior injury pattern.  The patient's history is carefully reviewed.  He has had recent gastrointestinal bleeding with 2 hospitalizations in the month of August.  He was discharged August 22 after an upper GI bleed with an acutely bleeding gastric ulcer and was felt to be at high risk of recurrent bleeding.  Plavix was stopped during that admission.  He was readmitted the following week with symptomatic anemia and underwent repeat EGD showing a hemorrhagic appearance at the previous Whipple site.  He was given an additional 1 unit of packed red blood cells.  GI notes specify that the patient should not be restarted on  Plavix.  Lengthy discussion with the patient's wife.  He is in very poor general health and is extremely cachectic on exam.  He has had recent gastrointestinal bleeding and is at high risk of rebleeding.  I do not think he is a candidate for emergency cardiac catheterization, PCI, or aggressive antiplatelet/anticoagulant therapy.  The patient appears to be having a major cardiac event with inferoposterior STEMI even though he is not experiencing any chest pain at all.  I think he is at a high risk of in-hospital mortality.  I have recommended a palliative approach to his care since we are not able to safely treat him with standard therapies for his heart attack.  I have discussed the patient's CODE status with his wife. I think a palliative approach would be appropriate with DNR in the case of a catastrophic event. She would like to wait until his daughter arrives from [place] (currently on the way here) and they will discuss. The patient is currently stable from a hemodynamic perspective and he is not in any pain. I would not recommend heparin, plavix, or other antithrombotic therapy as outlined above. Would admit to medicine service and cardiology will follow in consultation.  [name] 05/23/2021 5:20 AM

## 2021-05-23 NOTE — ED Provider Notes (Signed)
EMS call received at [facility], [facility] had ECG on a patient concerning for STEMI but they did not have ability to transmit ECG, requesting stop at [facility] for me to evaluate ECG.  ECG shows inferior wall STEMI, EMS team advised to activate code STEMI and proceed immediately to [facility].  I did not actually have an interaction with the patient, just a quick review of ECG.   Delora Fuel, MD 01/00/71 [phone]

## 2021-05-23 NOTE — Consult Note (Signed)
Cardiology Consultation:   Patient ID: DOVER HEAD MRN: [id number]; DOB: 1946/05/18  Admit date: 05/23/2021 Date of Consult: 05/23/2021  Primary Care Provider: [name], MD [facility] Cardiologist: Rozann Lesches, MD  [place] Electrophysiologist:  None   Patient Profile:   RODRIGO MCGRANAHAN is a 75 y.o. male with CAD status post PCI, prior AF status post TAVR, prior pancreatic cancer status post Whipple and adjuvant chemotherapy, T2DM, HLD, and HTN who presents with inferior STEMI.   History of Present Illness:   Paged for STEMI activation and transfer via EMS.  EMS arrived to home after family called for generalized weakness and fall.  On their assessment they found him to be altered with hypotension BP 80/48 and inferior STEMI on rhythm strip.  He was transferred to ED for further evaluation and given 1 L IV fluids in route.   On arrival patient was chronically ill-appearing, cachectic, and intermittently responding but was oriented to self and place.  He denied any chest pain but was reporting moderate severity abdominal pain without distention.  He did not remember what happened leading up to this episode.  BP was normotensive and responded to IV fluid bolus.  All other vital signs were otherwise unremarkable on arrival.  He has history of pancreatic cancer status post Whipple and adjuvant chemotherapy 4 years ago and then was hospitalized twice in the past month for upper GI bleed for which she was taken off Plavix and had to undergo repeat EGD.  He had initial EGD revealing anastomotic bleeding requiring epi injection and clip placement for which she was taken off Plavix and discharged home on 08/22.  He had recurrent rectal bleeding on 08/27 and 08/28 along with hypotension requiring APC and blood transfusion.   Wife reported that tonight he woke up from bed shaking which she has had happen before, and then he went to the restroom however she went to help him  with the restroom and he was extremely weak and fell over off the toilet.  He did not report to her any chest pain or other anginal equivalents.   Current lab work that has resulted with hemoglobin 7.8 and mildly AKI with sCr (1.3).  All other lab work is pending.   He was sent for CT had given the AMS and CT of abdomen section protocol given his abdominal pain.  CT head showed a left forehead scalp hematoma but no underlying fracture and otherwise no significant changes.  CT abdomen read pending.   Unfortunately Mr. Burget is extremely debilitated especially in the context of his recent hospitalizations for recurrent GI bleed for which she could not tolerate DAPT.  His presentation is most likely consistent with primary ACS and inferior STEMI.  Given his extremely poor functional status and issues tolerating DAPT I do not think that we are improving his quality of life by putting him through an emergency cardiac catheterization.  We discussed with his wife that he likely may pass during this hospitalization.  She understands the critical situation and dilemma we are facing and was in support of a comfort oriented approach.  We will await the final CT read however not planning on pursuing invasive coronary assessment and would focus on comfort status.  Would not heparinize or treat with P2 Y 12.  Can continue daily aspirin which she has been on after his GI bleed.  Would consult medicine for palliative admission.   Past Medical History:  Diagnosis Date   Arthritis  back   Blood transfusion without reported diagnosis    CAD (coronary artery disease)    a. 11/2016 in [place]: STEMI s/p DES to mid LAD, DES to diagonal, DES x 2 to proximal and mid RCA b. 08/2019: NSTEMI with DES to distal RCA   Essential hypertension    Full dentures    GERD (gastroesophageal reflux disease)    History of stroke    Hyperlipidemia    Myocardial infarction [facility])    Pancreatic cancer ([place])    Pneumonia    Renal cell  carcinoma ([place])    S/P TAVR (transcatheter aortic valve replacement)    a. 08/2017:  Edwards Sapien 3 THV (size 26 mm, model #9600CM26A , serial # [id number])   Severe aortic stenosis    a. 08/2017: s/p TAVR by Dr. Angelena Form and Dr. Cyndia Bent. 2021 NO AVS on echo   Type 2 diabetes mellitus ([place])    Wears glasses    Wears hearing aid    Past Surgical History:  Procedure Laterality Date   APPENDECTOMY     BIOPSY  05/11/2021   Procedure: BIOPSY;  Surgeon: Doran Stabler, MD;  Location: [facility];  Service: Gastroenterology;;   CARDIAC CATHETERIZATION     CORONARY ANGIOGRAPHY N/A 08/23/2019   Procedure: CORONARY ANGIOGRAPHY;  Surgeon: Belva Crome, MD;  Location: [place] [facility];  Service: Cardiovascular;  Laterality: N/A;   CORONARY STENT INTERVENTION N/A 08/23/2019   Procedure: CORONARY STENT INTERVENTION;  Surgeon: Belva Crome, MD;  Location: [place] [facility];  Service: Cardiovascular;  Laterality: N/A;   CORONARY STENT PLACEMENT     x 4 in March 2018   ESOPHAGOGASTRODUODENOSCOPY (EGD) WITH PROPOFOL N/A 01/02/2020   Procedure: ESOPHAGOGASTRODUODENOSCOPY (EGD) WITH PROPOFOL;  Surgeon: Jackquline Denmark, MD;  Location: [facility];  Service: Endoscopy;  Laterality: N/A;   ESOPHAGOGASTRODUODENOSCOPY (EGD) WITH PROPOFOL N/A 04/29/2021   Procedure: ESOPHAGOGASTRODUODENOSCOPY (EGD) WITH PROPOFOL;  Surgeon: Jackquline Denmark, MD;  Location: [facility];  Service: Endoscopy;  Laterality: N/A;   ESOPHAGOGASTRODUODENOSCOPY (EGD) WITH PROPOFOL N/A 05/11/2021   Procedure: ESOPHAGOGASTRODUODENOSCOPY (EGD) WITH PROPOFOL;  Surgeon: Doran Stabler, MD;  [facility]: [facility];  Service: Gastroenterology;  Laterality: N/A;   EUS N/A 08/02/2017   Procedure: UPPER ENDOSCOPIC ULTRASOUND (EUS) RADIAL;  Surgeon: [name], MD;  Location: [facility];  Service: Endoscopy;  Laterality: N/A;   EYE SURGERY Left    HAND SURGERY Bilateral    HEMOSTASIS CLIP PLACEMENT  01/02/2020   Procedure: HEMOSTASIS  CLIP PLACEMENT;  Surgeon: Jackquline Denmark, MD;  [facility]: [facility];  Service: Endoscopy;;   HEMOSTASIS CLIP PLACEMENT  04/29/2021   Procedure: HEMOSTASIS CLIP PLACEMENT;  Surgeon: Jackquline Denmark, MD;  [facility];  Service: Endoscopy;;   HERNIA REPAIR Right    HOT HEMOSTASIS N/A 01/02/2020   Procedure: HOT HEMOSTASIS (ARGON PLASMA COAGULATION/BICAP);  Surgeon: Jackquline Denmark, MD;  Location: [facility];  Service: Endoscopy;  Laterality: N/A;   HOT HEMOSTASIS N/A 05/11/2021   Procedure: HOT HEMOSTASIS (ARGON PLASMA COAGULATION/BICAP);  Surgeon: Doran Stabler, MD;  Location: [facility];  Service: Gastroenterology;  Laterality: N/A;   LAPAROSCOPY N/A 02/19/2018   Procedure: DIAGNOSTIC LAPAROSCOPY, ERAS PATHWAY;  Surgeon: [name], MD;  Location: [place];  Service: General;  Laterality: N/A;   MULTIPLE TOOTH EXTRACTIONS     NEPHRECTOMY Left 02/19/2018   Procedure: OPEN LEFT RADICAL NEPHRECTOMY;  Surgeon: Cleon Gustin, MD;  Location: [place];  Service: Urology;  Laterality: Left;   PORT-A-CATH REMOVAL  N/A 06/13/2019   Procedure: PORT REMOVAL;  Surgeon: [name], MD;  Location: [place];  Service: General;  Laterality: N/A;   PORTACATH PLACEMENT N/A 09/15/2017   Procedure: INSERTION PORT-A-CATH;  Surgeon: [name], MD;  Location: [place];  Service: General;  Laterality: N/A;   RIGHT/LEFT HEART CATH AND CORONARY ANGIOGRAPHY N/A 07/05/2017   Procedure: RIGHT/LEFT HEART CATH AND CORONARY ANGIOGRAPHY;  Surgeon: [name], MD;  Location: [place] [facility];  Service: Cardiovascular;  Laterality: N/A;   SCLEROTHERAPY  01/02/2020   Procedure: SCLEROTHERAPY;  Surgeon: Jackquline Denmark, MD;  Location: [facility];  Service: Endoscopy;;   SCLEROTHERAPY  04/29/2021   Procedure: Clide Deutscher;  Surgeon: Jackquline [facility], MD;  [facility];  Service: Endoscopy;;   SHOULDER ARTHROSCOPY WITH ROTATOR CUFF REPAIR Left    TEE WITHOUT CARDIOVERSION N/A 08/22/2017    Procedure: TRANSESOPHAGEAL ECHOCARDIOGRAM (TEE);  Surgeon: Burnell Blanks, MD;  Location: [place];  Service: Open Heart Surgery;  Laterality: N/A;   TRANSCATHETER AORTIC VALVE REPLACEMENT, TRANSFEMORAL N/A 08/22/2017   Procedure: TRANSCATHETER AORTIC VALVE REPLACEMENT, TRANSFEMORAL;  Surgeon: Burnell Blanks, MD;  Location: [place];  Service: Open Heart Surgery;  Laterality: N/A;   UPPER GASTROINTESTINAL ENDOSCOPY     WHIPPLE PROCEDURE N/A 02/19/2018   Procedure: WHIPPLE PROCEDURE;  Surgeon: [name], MD;  Location: [place];  Service: General;  Laterality: N/A;    Home Medications:  Prior to Admission medications   Medication Sig Start Date End Date Taking? Authorizing Provider  acetaminophen (TYLENOL) 325 MG tablet Take 325-650 mg by mouth every 6 (six) hours as needed for mild pain.    [provider]  atorvastatin (LIPITOR) 20 MG tablet Take 1 tablet (20 mg total) by mouth daily. 05/10/21   Eugenie Filler, MD  Continuous Blood Gluc Sensor (FREESTYLE LIBRE 14 DAY SENSOR) MISC Inject 1 patch into the skin every 14 (fourteen) days.    [provider]  diphenoxylate-atropine (LOMOTIL) 2.5-0.025 MG tablet Take 1 tablet by mouth daily at 2 PM. 05/12/21   Willia Craze, NP  feeding supplement (ENSURE ENLIVE / ENSURE PLUS) LIQD Take 237 mLs by mouth 2 (two) times daily between meals. Patient taking differently: Take 237 mLs by mouth 2 (two) times daily as needed (feeding suppliment). 05/03/21   Eugenie Filler, MD  Insulin Glargine [facility]) 100 UNIT/ML Inject 8 Units into the skin daily.    [provider]  insulin lispro (HUMALOG KWIKPEN) 100 UNIT/ML KwikPen Inject 5-8 Units into the skin 3 (three) times daily before meals. Inject 5-8 units 3 times a day with meals. Patient taking differently: Inject 2-3 Units into the skin 3 (three) times daily before meals. Sliding scale 12/04/20   Philemon Kingdom, MD  Insulin Pen Needle (PEN NEEDLES)  31G X 6 MM MISC Use as directed to check blood sugar 4 times a day. 11/10/20   Philemon Kingdom, MD  lipase/protease/amylase (CREON) 36000 UNITS CPEP capsule Take 2 capsules (72,000 Units total) by mouth 3 (three) times daily before meals. Pt taking 2 tab before meals. 05/03/21   Eugenie Filler, MD  losartan (COZAAR) 25 MG tablet Take 0.5 tablets (12.5 mg total) by mouth daily. 02/18/21   Strader, Fransisco Hertz, PA-C  Multiple Vitamin (MULTIVITAMIN WITH MINERALS) TABS tablet Take 1 tablet by mouth daily. 05/04/21   Eugenie Filler, MD  nitroGLYCERIN (NITROSTAT) 0.4 MG SL tablet Place 1 tablet (0.4 mg total) under the tongue every 5 (five) minutes x 3 doses  as needed for chest pain (if no relief after 2nd dose, proceed to the ED for an evaluation or call 911). 01/15/21   Satira Sark, MD  pantoprazole (PROTONIX) 40 MG tablet Take 1 tablet (40 mg total) by mouth 2 (two) times daily. 05/03/21   Eugenie Filler, MD  sucralfate (CARAFATE) 1 g tablet Take 1 tablet (1 g total) by mouth 4 (four) times daily. 05/12/21 06/11/21  Mercy Riding, MD    Inpatient Medications: Scheduled Meds:  insulin aspart  0-9 Units Subcutaneous Q4H   pantoprazole (PROTONIX) IV  40 mg Intravenous Q12H   Continuous Infusions:  sodium chloride Stopped (05/23/21 0524)   sodium chloride     norepinephrine (LEVOPHED) Adult infusion 5 mcg/min (05/23/21 0804)   sodium chloride     PRN Meds: docusate sodium, ondansetron (ZOFRAN) IV, polyethylene glycol  Allergies:    Allergies  Allergen Reactions   Carvedilol Other (See Comments)    "Could not function when taking this"    Social History:   Social History   Socioeconomic History   Marital status: Married    Spouse name: Not on file   Number of children: Not on file   Years of education: Not on file   Highest education level: Not on file  Occupational History   Not on file  Tobacco Use   Smoking status: Former    Packs/day: 1.00    Years: 57.00    Pack  years: 57.00    Types: Cigarettes    Start date: 09/12/1958    Quit date: 04/12/2016    Years since quitting: 5.1   Smokeless tobacco: Never  Vaping Use   Vaping Use: Never used  Substance and Sexual Activity   Alcohol use: No   Drug use: No   Sexual activity: Yes  Other Topics Concern   Not on file  Social History Narrative   Not on file   Social Determinants of Health   Financial Resource Strain: Not on file  Food Insecurity: No Food Insecurity   Worried About Running Out of Food in the Last Year: Never true   [place] in the Last Year: Never true  Transportation Needs: No Transportation Needs   Lack of Transportation (Medical): No   Lack of Transportation (Non-Medical): No  Physical Activity: Not on file  Stress: Not on file  Social Connections: Not on file  Intimate Partner Violence: Not on file    Family History:   Family History  Problem Relation Age of Onset   Lupus Mother    CAD Brother    Hypertension Brother    Colon cancer Neg Hx    Esophageal cancer Neg Hx    Rectal cancer Neg Hx    Stomach cancer Neg Hx     ROS:  Review of Systems: [y] = yes, [ ]  = no      General: Weight gain [ ] ; Weight loss [y]; Anorexia [y]; Fatigue [ ] ; Fever [ ] ; Chills [ ] ; Weakness [ ]    Cardiac: Chest pain/pressure [y]; Resting SOB [ ] ; Exertional SOB [ ] ; Orthopnea [ ] ; Pedal Edema [ ] ; Palpitations [ ] ; Syncope [ ] ; Presyncope [ ] ; Paroxysmal nocturnal dyspnea [ ]    Pulmonary: Cough [ ] ; Wheezing [ ] ; Hemoptysis [ ] ; Sputum [ ] ; Snoring [ ]    GI: Vomiting [ ] ; Dysphagia [ ] ; Melena [ ] ; Hematochezia [ ] ; Heartburn [ ] ; Abdominal pain [ ] ; Constipation [ ] ; Diarrhea [ ] ;  BRBPR [ ]    GU: Hematuria [ ] ; Dysuria [ ] ; Nocturia [ ]  Vascular: Pain in legs with walking [ ] ; Pain in feet with lying flat [ ] ; Non-healing sores [ ] ; Stroke [ ] ; TIA [ ] ; Slurred speech [ ] ;   Neuro: Headaches [ ] ; Vertigo [ ] ; Seizures [ ] ; Paresthesias [ ] ;Blurred vision [ ] ; Diplopia [ ] ; Vision  changes [ ]    Ortho/Skin: Arthritis [ ] ; Joint pain [ ] ; Muscle pain [ ] ; Joint swelling [ ] ; Back Pain [ ] ; Rash [ ]    Psych: Depression [ ] ; Anxiety [ ]    Heme: Bleeding problems [ ] ; Clotting disorders [ ] ; Anemia [ ]    Endocrine: Diabetes [ ] ; Thyroid dysfunction [ ]    Physical Exam/Data:   Vitals:   05/23/21 0700 05/23/21 0715 05/23/21 0745 05/23/21 0801  BP: 121/67 125/73 139/76 (!) 142/73  Pulse: 90 91 89 89  Resp: 14 15 18 15   Temp:    (!) 97.2 F (36.2 C)  TempSrc:    Temporal  SpO2: 99% 98% 100%   Weight:      Height:        Intake/Output Summary (Last 24 hours) at 05/23/2021 0814 Last data filed at 05/23/2021 0607 Gross per 24 hour  Intake 2500 ml  Output --  Net 2500 ml   Last 3 Weights 05/23/2021 05/09/2021 04/29/2021  Weight (lbs) 154 lb 5.2 oz 114 lb 10.2 oz 115 lb 15.4 oz  Weight (kg) 70 kg 52 kg 52.6 kg     Body mass index is 22.79 kg/m.  General:  cachectic, generally ill appearing, intermittently responsive  HEENT: normal Lymph: no adenopathy Neck: no JVD Endocrine:  No thryomegaly Vascular: No carotid bruits; FA pulses 2+ bilaterally without bruits  Cardiac:  normal S1, S2; RRR; no murmur  Lungs:  clear to auscultation bilaterally, no wheezing, rhonchi or rales  Abd: diffusely tender to palpation  Ext: no edema Musculoskeletal:  No deformities, BUE and BLE strength normal and equal Skin: warm and dry  Neuro:  CNs 2-12 intact, no focal abnormalities noted Psych:  Normal affect   EKG:  The EKG was personally reviewed and demonstrates:  STEMI in II/III/aVF Telemetry:  Telemetry was personally reviewed and demonstrates:  STE in inferior leads, otherwise NSR  Relevant CV Studies:  TTE Result date: 07/16/20  1. Left ventricular ejection fraction, by estimation, is 45 to 50%. The  left ventricle has mildly decreased function. The left ventricle  demonstrates global hypokinesis. Left ventricular diastolic parameters are  consistent with Grade I  diastolic  dysfunction (impaired relaxation). Elevated left atrial pressure.   2. Right ventricular systolic function is normal. The right ventricular  size is normal. There is normal pulmonary artery systolic pressure.   3. Left atrial size was mildly dilated.   4. The mitral valve is normal in structure. No evidence of mitral valve  regurgitation. No evidence of mitral stenosis.   5. Edwards Sapien 3 THV size 26 mm is in the AV position with normal  function. . The aortic valve has been repaired/replaced. There is mild  calcification of the aortic valve. There is mild thickening of the aortic  valve. Aortic valve regurgitation is  not visualized. No aortic stenosis is present.   6. The inferior vena cava is normal in size with greater than 50%  respiratory variability, suggesting right atrial pressure of 3 mmHg.   Laboratory Data:  High Sensitivity Troponin:   Recent Labs  Lab  04/26/21 1552 04/26/21 1726 05/23/21 0406  TROPONINIHS 31* 29* 103*     Chemistry Recent Labs  Lab 05/23/21 0406 05/23/21 0418  NA 131* 131*  K 3.6 3.6  CL 100 98  CO2 19*  --   GLUCOSE 142* 138*  BUN 23 20  CREATININE 1.43* 1.30*  CALCIUM 7.9*  --   GFRNONAA 50*  --   ANIONGAP 12  --     Recent Labs  Lab 05/23/21 0406  PROT 4.8*  ALBUMIN 1.9*  AST 25  ALT 14  ALKPHOS 257*  BILITOT 1.0   Hematology Recent Labs  Lab 05/19/21 1318 05/23/21 0406 05/23/21 0418  WBC 12.0* 22.2*  --   RBC 2.81* 2.41*  --   HGB 8.4 Repeated and verified X2.* 7.5* 7.8*  HCT 25.6 Repeated and verified X2.* 22.7* 23.0*  MCV 91.1 94.2  --   MCH  --  31.1  --   MCHC 32.6 33.0  --   RDW 16.4* 15.8*  --   PLT 171.0 301  --    BNP Recent Labs  Lab 05/23/21 0406  BNP 307.7*    DDimer No results for input(s): DDIMER in the last 168 hours.  Radiology/Studies:  CT HEAD WO CONTRAST (5MM)  Result Date: 05/23/2021 CLINICAL DATA:  75 year old male found down in bathroom. Altered mental status,  hypotension. EXAM: CT HEAD WITHOUT CONTRAST TECHNIQUE: Contiguous axial images were obtained from the base of the skull through the vertex without intravenous contrast. COMPARISON:  Brain MRI 07/29/2017.  Head CT 03/04/2021. FINDINGS: Brain: No midline shift, ventriculomegaly, mass effect, evidence of mass lesion, intracranial hemorrhage or evidence of cortically based acute infarction. Patchy bilateral white matter hypodensity appears stable since June, slightly greater in the left hemisphere. Stable gray-white matter differentiation throughout the brain. Vascular: Calcified atherosclerosis at the skull base. No suspicious intracranial vascular hyperdensity. Skull: No acute osseous abnormality identified. Sinuses/Orbits: Mild left mastoid air cell effusion. But other Visualized paranasal sinuses and mastoids are clear. Left tympanic cavity is clear. Other: Negative nasopharynx contour. No acute orbit soft tissue finding. Left forehead scalp hematoma measures about 5 mm in thickness. Underlying calvarium is intact. No scalp soft tissue gas. IMPRESSION: 1. Left forehead scalp hematoma without underlying skull fracture. 2. No acute intracranial abnormality. Stable CT appearance of white matter disease since June. 3. Mild left mastoid air cell effusion, significance doubtful. Electronically Signed   By: Genevie Ann M.D.   On: 05/23/2021 04:30   DG Chest Portable 1 View  Result Date: 05/23/2021 CLINICAL DATA:  75 year old male with chest pain.  Found down. EXAM: PORTABLE CHEST 1 VIEW COMPARISON:  Chest radiographs 04/26/2021 and earlier. FINDINGS: Portable AP semi upright view at 0431 hours. Chronic pulmonary hyperinflation but lower lung volumes today. Stable mediastinal contours, sequelae of TAVR. Calcified aortic atherosclerosis. Visualized tracheal air column is within normal limits. No pneumothorax, pulmonary edema, pleural effusion or confluent pulmonary opacity. External liquid contrast projects over the  patient's left chest and shoulder. No acute osseous abnormality identified. Negative visible bowel gas pattern. IMPRESSION: Lower lung volumes.  No acute cardiopulmonary abnormality. Electronically Signed   By: Genevie Ann M.D.   On: 05/23/2021 04:58   CT Angio Chest/Abd/Pel for Dissection W and/or Wo Contrast  Result Date: 05/23/2021 CLINICAL DATA:  75 year old male found down. Hypotensive. STEMI. 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 EXAM: CT ANGIOGRAPHY CHEST, ABDOMEN AND PELVIS TECHNIQUE: Non-contrast CT of the chest was initially obtained. Multidetector  CT imaging through the chest, abdomen and pelvis was performed using the standard protocol during bolus administration of intravenous contrast. Multiplanar reconstructed images and MIPs were obtained and reviewed to evaluate the vascular anatomy. CONTRAST:  113m OMNIPAQUE IOHEXOL 350 MG/ML SOLN COMPARISON:  CT Chest, Abdomen, and Pelvis 12/25/2017. CT abdomen 10/15/2020. CT Abdomen and Pelvis 12/30/2019. FINDINGS: CTA CHEST FINDINGS Cardiovascular: Prior TAVR. Calcified aortic atherosclerosis. Calcified coronary artery atherosclerosis. Negative for thoracic aortic aneurysm or dissection. Atherosclerosis of the proximal great vessels which remain patent, although there does appear to be high-grade left subclavian artery stenosis on series 7, image 24. Cardiac size has increased since 2019 with mild cardiomegaly now. No pericardial effusion. The central pulmonary arteries are also enhanced and appear patent. No pulmonary embolus is identified. Mediastinum/Nodes: No mediastinal mass or lymphadenopathy. However, there is soft tissue edema or inflammation in the lower mediastinum just above the diaphragm. See additional abdominal findings below. Lungs/Pleura: Small bilateral layering pleural effusions, with simple fluid density. Major airways are patent. A chronic right lower lobe lung nodule measuring 9-10  mm on series 6, image 39 is stable since 2019 and benign. Occasional upper lobe centrilobular emphysema. Mild dependent atelectasis but no consolidation or pneumonia. Musculoskeletal: Osteopenia. Flowing endplate osteophytes in the thoracic spine resulting in ankylosis. No acute or suspicious osseous lesion in the chest. Review of the MIP images confirms the above findings. CTA ABDOMEN AND PELVIS FINDINGS VASCULAR Extensive Aortoiliac calcified atherosclerosis. Negative for abdominal aortic aneurysm or dissection. Sequelae of left nephrectomy with calcified, occluded left renal artery stump. The other major aortic branches remain patent. There is delayed arterial bolus timing in the pelvis. 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 And there is a superimposed separate saccular aneurysm at the right iliac bifurcation which is 30 mm now versus 24 mm last year. Extensive underlying bilateral calcified iliac artery atherosclerosis, but iliac artery patency is poorly evaluated due to delayed contrast bolus. Bilateral femoral artery calcification also noted. Review of the MIP images confirms the above findings. NON-VASCULAR Hepatobiliary: Low-density perihepatic ascites. Stable chronic pneumobilia. No discrete liver lesion. Pancreas: Status post Whipple. Spleen: Perisplenic ascites, otherwise negative. Adrenals/Urinary Tract: Status post left oophorectomy. There is right renal enhancement. No right hydronephrosis. Unremarkable bladder. Stomach/Bowel: Bowel detail is limited by anasarca and ascites. Severe diverticulosis of the sigmoid colon is redemonstrated. There is no dilated large or small bowel. Sequelae of Whipple with gastric jejunostomy in the left abdomen. Ascites, but no definite pneumoperitoneum. Lymphatic: No lymphadenopathy is evident although detail is limited by anasarca. Reproductive: Negative. Other: Generalized body wall edema is new since last year. And there is  new simple fluid density ascites throughout the abdomen and pelvis, volume is small to moderate. Musculoskeletal: Chronic L2 compression fracture is stable since 2019. Chronic lumbar facet arthropathy. No acute or suspicious osseous lesion. Review of the MIP images confirms the above findings. IMPRESSION: 1. Extensive aortic atherosclerosis but negative for Aortic dissection or aneurysm. Prior TAVR. No pulmonary embolus identified. 2. But Positive for delayed contrast transit, such that the pelvic and proximal femoral arteries are unenhanced and not well evaluated (see also #5). This may indicate acute heart failure, and there is New Cardiomegaly since last year. 3. Positive also for new generalized Anasarca, and small to moderate volume Ascites. There are trace layering pleural effusions. 4. No pneumonia, but limited detail of abdominal viscera secondary to combination of #2 and #3. Prior Whipple, and left nephrectomy. No bowel obstruction  or free air. 5. 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 6. No acute osseous abnormality identified. Chronic L2 compression fracture. Electronically Signed   By: Genevie Ann M.D.   On: 05/23/2021 05:16    TIMI Risk Score for ST  Elevation MI:   The patient's TIMI risk score is 9, which indicates a 35.9% risk of all cause mortality at 30 days.   Assessment and Plan:   Inferior STEMI Reference notes from Dr. Burt Knack myself earlier in the night for overview of discussion.  Unfortunately Mr. Swider presented with inferior STEMI in the context of multiple medical comorbidities and recent recurrent GI bleeds requiring him to stop his Plavix.  He has prior history of Whipple with anastomotic bleeding along with gastric ulcers resulting in recurrent EGDs and need for blood transfusion.  On top of this he is cachexia and his baseline functional status  has really declined over the past month with 2 hospitalizations.  CT angio C/A/P was negative for dissection and his hemoglobin was fairly stable from prior.  His lactate was elevated which likely reflects ongoing RV infarct with inferior STEMI.  Extensive discussion with his wife about goals of care and she was reasonable with what his goals ultimately would be.  She understands that we are very limited in that interventions without the ability to put him on longer term anticoagulation would not be reasonable given his current functional status.  Recommend admission for palliative care as there is a high chance that he will not survive this admission.  Would not anticoagulate or put on P2Y12i given issues with bleeding.  Okay for daily aspirin but otherwise would not heparinize.  No additional work-up or evaluation currently indicated given that we are limited in treatment options.  [facility] will sign off.   Medication Recommendations: no OAC or P2Y12i Other recommendations (labs, testing, etc): no  Follow up as an outpatient: pending course   For questions or updates, please contact [place] Please consult [email or web address] for contact info under   Signed, Dion Body, MD  05/23/2021 8:14 AM

## 2021-05-23 NOTE — ED Notes (Signed)
1st unit PRBC infusing with no adverse effect , VSWNL , respirations unlabored , afebrile , IV sites intact , waiting for ICU bed assignment .

## 2021-05-23 NOTE — ED Notes (Signed)
Patient is resting comfortably. 

## 2021-05-23 NOTE — Significant Event (Signed)
  Interdisciplinary Goals of Care Family Meeting   Date carried out:: 05/23/2021  Location of the meeting: Bedside with patient and wife f/b discussion with wife, patient's daughter and son-in-law in conference room  Member's involved: Nurse Practitioner and Palliative care team member  Durable Power of Attorney or acting medical decision maker: patient/ spouse    Discussion: We discussed goals of care for [employer] .    I came to check on patient given admission early this morning as patient still awaiting on bed.  Chart reviewed.  Patient very hard of hearing but alert and oriented, now off NE with SBP in the 150s.  He complains of active chest pain with radiation into his left arm.  He wants to eat. He expresses understanding of his current medical situation and tells me is just wants to go home and be with family if nothing can be done.  Wife at bedside and is tearful.  Ask if daughter can be present as well.  I met with wife, daughter, and son-in-law in conference room who want to honor his wishes, for him not to suffer, and expresses that they want him to come home for what ever time he has remaining.  Shela Nevin NP with Palliative care found [place] and joined the meeting.  Palliative will start working on steps to get him home and in the mean time, given pt/ family wishes and that given that we no longer have anything to offer taking him to ICU, we will admit observation to med-surg bed for palliative care till further arrangements can be made to take him home.  Family is aware that in order to provide comfort and treat his current pain, that pain medicines could precipitate further hypotension given his inferior MI.  Family understands and main focus is comfort.     Code status: Full DNR  Disposition: In-patient comfort care pending arrangements for patient to go home with hospice  Time spent for the meeting: 45 mins    Orders in Epic updated to reflect conversation.   Greatly appreciate Palliative's help.  Have signed out to [facility] to pick up patient on 9/12 in the event patient is not able to go home.      Kennieth Rad, ACNP [facility] 05/23/2021, 12:28 PM

## 2021-05-23 NOTE — Consult Note (Signed)
Consultation Note Date: 05/23/2021   Patient Name: Manuel Olson  DOB: 1946/08/30  MRN: [id number]  Age / Sex: 75 y.o., male  PCP: [name], MD Referring Physician: Renee Pain, MD  Reason for Consultation: Establishing goals of care  HPI/Patient Profile: 75 y.o. male  with past medical history of coronary artery disease status post prior PCI, severe aortic stenosis status post TAVR, pancreatic cancer status post Whipple and chemotherapy, and CKD stage IIIa. He presented to the emergency department on 05/23/2021 after a near syncopal episode and altered mental status. On arrival to the ER, he was found to be hypotensive and transiently required vasopressor support. EKG was consistent with acute inferoposterior wall myocardial infarction. Code STEMI was called and he was seen by cardiology who has determined he is not a good candidate for invasive interventions. He is also unable to tolerate antithrombotic/antiplatelet therapy due to recent GI bleed.     Clinical Assessment and Goals of Care: I have reviewed medical records including EPIC notes, labs and imaging, and examined the patient in the ED. Vasopressor is off with SBP currently in the 150's. On my assessment, he is mildly lethargic but able to answer questions appropriately. He tells me he wants to go home as soon as possible. He tells me he has had a massive heart attack and that his body is "tore all to hell" on the inside. He does endorse back pain, as well as some chest pain.    I found that patient's family was already in the ED conference room meeting with Kennieth Rad, NP with PCCM. I joined them to continue discussing [place], EOL wishes, disposition, and options. Present are patient's wife [name], daughter Venida Jarvis, and Sherri's husband.   I introduced Palliative Medicine as specialized medical care for people living with serious  illness. It focuses on providing relief from the symptoms and stress of a serious illness.   We discussed a brief life review of the patient. He is originally from [place]. Married to [name] for many years. They have a daughter Venida Jarvis) who lives in [place] and a son Ronalee Belts) who lives in [place]. Patient is a retired Engineer, structural.   We discussed his current illness and what it means in the larger context of his ongoing co-morbidities. Unfortunately, his baseline functional status has significantly declined in the setting of recent hospitalizations for GI bleed. Family verbalizes understanding that he is not a candidate for cardiac interventions or antithrombotic/antiplatelet therapy. They acknowledge that he has endured a great deal over the past few years and his body is tired. Natural trajectory at EOL was discussed.  Discussed goals of care important to the patient and family. They support his desire to be at home for EOL. They want him to be comfortable. Discussed that a comfort path is about allowing a natural course to occur with the goal of comfort and dignity rather than cure/prolonging life. Provided education and counseling at length on the philosophy and benefits of hospice care. Discussed that they can provide DME, symptom  management, and support for the family.   Discussed comfort medications. Family is aware that this medication may cause his blood pressure to decrease again. Also discussed that if patient declines overnight and is no longer stable to transport home, we could transition to comfort care in house.   Referral made to [facility] per family's choice.   19:00 - I spoke with patient's wife [name] by phone. She confirms that DME has been delivered (bed, oxygen, and bedside table). Plan is for discharge home in the morning.   19:14 - Plan of care was discussed with Mariann Laster RN care manager in the ED. Discussed that [facility]   Primary decision maker: Patient with  support from his wife [name]    SUMMARY OF RECOMMENDATIONS   Continue current supportive care No further vasopressors or lab work Plan for discharge home tomorrow with [place] If patient declines overnight, will transition to full comfort care in house  I have sent RX for home comfort meds to pharmacy on file Oxycodone IR 5 mg every 4 hours as needed for pain or dyspnea Lorazepam (Ativan) 0.5-1 mg PO every 4 hours as needed for anxiety  Code Status/Advance Care Planning: DNR  Symptom Management:  Morphine 1 mg IV every 3 hours as needed for pain or dyspnea  Palliative Prophylaxis:  Frequent Pain Assessment  Additional Recommendations (Limitations, Scope, Preferences): No Lab Draws  Psycho-social/Spiritual:  Created space and opportunity for patient and family to express thoughts and feelings regarding patient's current medical situation.  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 The following aspects are pertinent.  Past Medical History:  Diagnosis Date   Arthritis    back   Blood transfusion without reported diagnosis    CAD (coronary artery disease)    a. 11/2016 in [place]: STEMI s/p DES to mid LAD, DES to diagonal, DES x 2 to proximal and mid RCA b. 08/2019: NSTEMI with DES to distal RCA   Essential hypertension    Full dentures    GERD (gastroesophageal reflux disease)    History of stroke    Hyperlipidemia    Myocardial infarction [facility])    Pancreatic  cancer ([place])    Pneumonia    Renal cell carcinoma ([place])    S/P TAVR (transcatheter aortic valve replacement)    a. 08/2017:  Edwards Sapien 3 THV (size 26 mm, model #[id number] , serial # [id number])   Severe aortic stenosis    a. 08/2017: s/p TAVR by Dr. Angelena Form and Dr. Cyndia Bent. 2021 NO AVS on echo   Type 2 diabetes mellitus ([place])    Wears glasses    Wears hearing aid      Family History  Problem Relation Age of Onset   Lupus Mother    CAD Brother    Hypertension Brother    Colon cancer Neg Hx    Esophageal cancer Neg Hx    Rectal cancer Neg Hx    Stomach cancer Neg Hx    Scheduled Meds:  insulin aspart  0-9 Units Subcutaneous Q4H   pantoprazole (PROTONIX) IV  40 mg Intravenous Q12H   Continuous Infusions:  sodium chloride Stopped (05/23/21 0524)   sodium chloride     norepinephrine (LEVOPHED) Adult infusion Stopped (05/23/21 0959)   sodium chloride     PRN Meds:.docusate sodium, ondansetron (ZOFRAN) IV, polyethylene glycol   Allergies  Allergen Reactions   Carvedilol Other (See Comments)    "Could not function when taking this"   Review of Systems  Cardiovascular:  Positive for chest pain.  Musculoskeletal:  Positive for back pain.   Physical Exam Vitals reviewed.  Constitutional:      General: He is not in acute distress.    Appearance: He is cachectic. He is ill-appearing.  Pulmonary:     Effort: Pulmonary effort is normal.  Neurological:     Mental Status: He is alert and oriented to person, place, and time.     Motor: Weakness present.    Vital Signs: BP (!) 158/89   Pulse 86   Temp (!) 97.2 F (36.2 C) (Temporal)   Resp 14   Ht 5' 9"  (1.753 m)   Wt 70 kg   SpO2 100%   BMI 22.79 kg/m  Pain Scale: 0-10   Pain Score: 0-No pain   SpO2: SpO2: 100 % O2 Device:SpO2: 100 % O2 Flow Rate: .O2 Flow Rate (L/min): 2 L/min  IO: Intake/output summary:  Intake/Output Summary (Last 24 hours) at 05/23/2021 1115 Last data filed at 05/23/2021 1001 Gross per  24 hour  Intake 2575.3 ml  Output --  Net 2575.3 ml    LBM:   Baseline Weight: Weight: 70 kg Most recent weight: Weight: 70 kg      Palliative Assessment/Data: PPS 20-30%     Time In: 1115 Time Out: 1225 Time Total: 70 minutes Greater than 50%  of this time was spent counseling and coordinating care related to the above assessment and plan.  Signed by: Lavena Bullion, NP   Please contact Palliative Medicine Team phone at [phone] for questions and concerns.  For individual provider: See Shea Evans

## 2021-05-23 NOTE — ED Provider Notes (Signed)
[facility] EMERGENCY DEPARTMENT Provider Note   CSN: [id number] Arrival date & time: 05/23/21  0358     History Chief Complaint  Patient presents with   STEMI / Lytle Michaels    Manuel Olson is a 75 y.o. male.  Level 5 caveat for acuity of condition.  Patient presents via EMS with concern EKG for ST elevation MI.  EMS reports they were called out for patient with generalized weakness unable to get off the bathroom floor.  They found him to be confused, hypotensive in the 80s and EKG showed inferior ST elevation.  They stopped [facility] for EKG review and code STEMI was activated.  On the way here patient began complaining of upper chest pain and back pain. Patient with complicated past medical history.  PMH of pancreatic cancer s/p Whipple, RCC s/p nephrectomy, AVR, CAD s/p stents, DM-2, HTN, PUD, GIB, HLD and recent hospitalization for upper GIB  He is complaining of chest pain, abdominal pain and back pain.  He appears to be confused with difficulty answering questions.  Patient's wife has arrived.  She states that he became generally weak while in the bathroom and slid to the ground.  Does not believe that he hit his head or lost consciousness.  She denies any recent black or bloody stools since his hospital discharge.  He is not complaining of any chest pain at home.  The history is provided by the patient, the EMS personnel and a relative. The history is limited by the condition of the patient.      Past Medical History:  Diagnosis Date   Arthritis    back   Blood transfusion without reported diagnosis    CAD (coronary artery disease)    a. 11/2016 in [place]: STEMI s/p DES to mid LAD, DES to diagonal, DES x 2 to proximal and mid RCA b. 08/2019: NSTEMI with DES to distal RCA   Essential hypertension    Full dentures    GERD (gastroesophageal reflux disease)    History of stroke    Hyperlipidemia    Myocardial infarction [facility])    Pancreatic cancer  ([place])    Pneumonia    Renal cell carcinoma ([place])    S/P TAVR (transcatheter aortic valve replacement)    a. 08/2017:  Edwards Sapien 3 THV (size 26 mm, model #9600CM26A , serial # [id number])   Severe aortic stenosis    a. 08/2017: s/p TAVR by Dr. Angelena Form and Dr. Cyndia Bent. 2021 NO AVS on echo   Type 2 diabetes mellitus ([place])    Wears glasses    Wears hearing aid     Patient Active Problem List   Diagnosis Date Noted   Lower GI bleed 05/09/2021   AKI (acute kidney injury) ([place]) 05/09/2021   Prolonged QT interval 05/09/2021   Symptomatic anemia    GI bleed 04/26/2021   Pressure injury of skin 03/07/2021   COVID-19 virus infection 03/05/2021   COVID-19 03/04/2021   Generalized weakness 03/04/2021   Confusion 03/04/2021   Renal cell carcinoma ([place]) 08/13/2020   Anastomotic ulcer 01/21/2020   Long term (current) use of antithrombotics/antiplatelets 01/21/2020   Upper gastrointestinal bleed 12/31/2019   Acute GI bleeding 12/30/2019   Angina pectoris ([place]) 08/22/2019   Protein-calorie malnutrition, severe 04/05/2018   Debility 02/27/2018   Port-A-Cath in place 10/10/2017   Renal cell carcinoma of right kidney [facility]) s/p nephrectomy 02/2018 10/02/2017   Ileitis, terminal ([place]) 09/28/2017   Hyponatremia 09/27/2017   Insulin dependent  type 2 diabetes mellitus ([place]) 08/25/2017   Mixed hyperlipidemia 08/25/2017   Essential hypertension, benign    CAD (coronary artery disease)    Former smoker    Severe AS S/P TAVR  08/22/2017   Cancer of head of pancreas [facility]) s/p Whipple 02/2018 08/10/2017    Past Surgical History:  Procedure Laterality Date   APPENDECTOMY     BIOPSY  05/11/2021   Procedure: BIOPSY;  Surgeon: Doran Stabler, MD;  Location: [facility];  Service: Gastroenterology;;   CARDIAC CATHETERIZATION     CORONARY ANGIOGRAPHY N/A 08/23/2019   Procedure: CORONARY ANGIOGRAPHY;  Surgeon: Belva Crome, MD;  Location: [place] [facility];  Service: Cardiovascular;  Laterality:  N/A;   CORONARY STENT INTERVENTION N/A 08/23/2019   Procedure: CORONARY STENT INTERVENTION;  Surgeon: Belva Crome, MD;  Location: [place] CV LAB;  Service: Cardiovascular;  Laterality: N/A;   CORONARY STENT PLACEMENT     x 4 in March 2018   ESOPHAGOGASTRODUODENOSCOPY (EGD) WITH PROPOFOL N/A 01/02/2020   Procedure: ESOPHAGOGASTRODUODENOSCOPY (EGD) WITH PROPOFOL;  Surgeon: Jackquline Denmark, MD;  [facility]: [facility];  Service: Endoscopy;  Laterality: N/A;   ESOPHAGOGASTRODUODENOSCOPY (EGD) WITH PROPOFOL N/A 04/29/2021   Procedure: ESOPHAGOGASTRODUODENOSCOPY (EGD) WITH PROPOFOL;  Surgeon: Jackquline Denmark, MD;  [facility];  Service: Endoscopy;  Laterality: N/A;   ESOPHAGOGASTRODUODENOSCOPY (EGD) WITH PROPOFOL N/A 05/11/2021   Procedure: ESOPHAGOGASTRODUODENOSCOPY (EGD) WITH PROPOFOL;  Surgeon: Doran Stabler, MD;  [facility];  Service: Gastroenterology;  Laterality: N/A;   EUS N/A 08/02/2017   Procedure: UPPER ENDOSCOPIC ULTRASOUND (EUS) RADIAL;  Surgeon: [name], MD;  [facility];  Service: Endoscopy;  Laterality: N/A;   EYE SURGERY Left    HAND SURGERY Bilateral    HEMOSTASIS CLIP PLACEMENT  01/02/2020   Procedure: HEMOSTASIS CLIP PLACEMENT;  Surgeon: Jackquline Denmark, MD;  [facility];  Service: Endoscopy;;   HEMOSTASIS CLIP PLACEMENT  04/29/2021   Procedure: HEMOSTASIS CLIP PLACEMENT;  Surgeon: Jackquline Denmark, MD;  [facility];  Service: Endoscopy;;   HERNIA REPAIR Right    HOT HEMOSTASIS N/A 01/02/2020   Procedure: HOT HEMOSTASIS (ARGON PLASMA COAGULATION/BICAP);  Surgeon: Jackquline Denmark, MD;  [facility]: [facility];  Service: Endoscopy;  Laterality: N/A;   HOT HEMOSTASIS N/A 05/11/2021   Procedure: HOT HEMOSTASIS (ARGON PLASMA COAGULATION/BICAP);  Surgeon: Doran Stabler, MD;  Location: [facility];  Service: Gastroenterology;  Laterality: N/A;   LAPAROSCOPY N/A 02/19/2018   Procedure: DIAGNOSTIC LAPAROSCOPY, ERAS PATHWAY;   Surgeon: [name], MD;  Location: [facility];  Service: General;  Laterality: N/A;   MULTIPLE TOOTH EXTRACTIONS     NEPHRECTOMY Left 02/19/2018   Procedure: OPEN LEFT RADICAL NEPHRECTOMY;  Surgeon: Cleon Gustin, MD;  Location: [place];  Service: Urology;  Laterality: Left;   PORT-A-CATH REMOVAL N/A 06/13/2019   Procedure: PORT REMOVAL;  Surgeon: [name], MD;  Location: [place];  Service: General;  Laterality: N/A;   PORTACATH PLACEMENT N/A 09/15/2017   Procedure: INSERTION PORT-A-CATH;  Surgeon: [name], MD;  Location: [place];  Service: General;  Laterality: N/A;   RIGHT/LEFT HEART CATH AND CORONARY ANGIOGRAPHY N/A 07/05/2017   Procedure: RIGHT/LEFT HEART CATH AND CORONARY ANGIOGRAPHY;  Surgeon: [name], MD;  Location: [place] [facility];  Service: Cardiovascular;  Laterality: N/A;   SCLEROTHERAPY  01/02/2020   Procedure: Clide Deutscher;  Surgeon: Jackquline Denmark, MD;  [facility];  Service: Endoscopy;;   SCLEROTHERAPY  04/29/2021   Procedure: SCLEROTHERAPY;  Surgeon: Jackquline Denmark, MD;  Location: [facility];  Service: Endoscopy;;   SHOULDER ARTHROSCOPY WITH ROTATOR CUFF REPAIR Left    TEE WITHOUT CARDIOVERSION N/A 08/22/2017   Procedure: TRANSESOPHAGEAL ECHOCARDIOGRAM (TEE);  Surgeon: Burnell Blanks, MD;  Location: [place];  Service: Open Heart Surgery;  Laterality: N/A;   TRANSCATHETER AORTIC VALVE REPLACEMENT, TRANSFEMORAL N/A 08/22/2017   Procedure: TRANSCATHETER AORTIC VALVE REPLACEMENT, TRANSFEMORAL;  Surgeon: Burnell Blanks, MD;  Location: [place];  Service: Open Heart Surgery;  Laterality: N/A;   UPPER GASTROINTESTINAL ENDOSCOPY     WHIPPLE PROCEDURE N/A 02/19/2018   Procedure: WHIPPLE PROCEDURE;  Surgeon: [name], MD;  Location: [place];  Service: General;  Laterality: N/A;       Family History  Problem Relation Age of Onset   Lupus Mother    CAD Brother    Hypertension Brother    Colon cancer Neg Hx    Esophageal  cancer Neg Hx    Rectal cancer Neg Hx    Stomach cancer Neg Hx     Social History   Tobacco Use   Smoking status: Former    Packs/day: 1.00    Years: 57.00    Pack years: 57.00    Types: Cigarettes    Start date: 09/12/1958    Quit date: 04/12/2016    Years since quitting: 5.1   Smokeless tobacco: Never  Vaping Use   Vaping Use: Never used  Substance Use Topics   Alcohol use: No   Drug use: No    Home Medications Prior to Admission medications   Medication Sig Start Date End Date Taking? Authorizing Provider  acetaminophen (TYLENOL) 325 MG tablet Take 325-650 mg by mouth every 6 (six) hours as needed for mild pain.    [provider]  atorvastatin (LIPITOR) 20 MG tablet Take 1 tablet (20 mg total) by mouth daily. 05/10/21   Eugenie Filler, MD  Continuous Blood Gluc Sensor (FREESTYLE LIBRE 14 DAY SENSOR) MISC Inject 1 patch into the skin every 14 (fourteen) days.    [provider]  diphenoxylate-atropine (LOMOTIL) 2.5-0.025 MG tablet Take 1 tablet by mouth daily at 2 PM. 05/12/21   Willia Craze, NP  feeding supplement (ENSURE ENLIVE / ENSURE PLUS) LIQD Take 237 mLs by mouth 2 (two) times daily between meals. Patient taking differently: Take 237 mLs by mouth 2 (two) times daily as needed (feeding suppliment). 05/03/21   Eugenie Filler, MD  Insulin Glargine [facility]) 100 UNIT/ML Inject 8 Units into the skin daily.    [provider]  insulin lispro (HUMALOG KWIKPEN) 100 UNIT/ML KwikPen Inject 5-8 Units into the skin 3 (three) times daily before meals. Inject 5-8 units 3 times a day with meals. Patient taking differently: Inject 2-3 Units into the skin 3 (three) times daily before meals. Sliding scale 12/04/20   Philemon Kingdom, MD  Insulin Pen Needle (PEN NEEDLES) 31G X 6 MM MISC Use as directed to check blood sugar 4 times a day. 11/10/20   Philemon Kingdom, MD  lipase/protease/amylase (CREON) 36000 UNITS CPEP capsule Take 2 capsules  (72,000 Units total) by mouth 3 (three) times daily before meals. Pt taking 2 tab before meals. 05/03/21   Eugenie Filler, MD  losartan (COZAAR) 25 MG tablet Take 0.5 tablets (12.5 mg total) by mouth daily. 02/18/21   Strader, Fransisco Hertz, PA-C  Multiple Vitamin (MULTIVITAMIN WITH MINERALS) TABS tablet Take 1 tablet by mouth daily. 05/04/21   Eugenie Filler, MD  nitroGLYCERIN (  NITROSTAT) 0.4 MG SL tablet Place 1 tablet (0.4 mg total) under the tongue every 5 (five) minutes x 3 doses as needed for chest pain (if no relief after 2nd dose, proceed to the ED for an evaluation or call 911). 01/15/21   Satira Sark, MD  pantoprazole (PROTONIX) 40 MG tablet Take 1 tablet (40 mg total) by mouth 2 (two) times daily. 05/03/21   Eugenie Filler, MD  sucralfate (CARAFATE) 1 g tablet Take 1 tablet (1 g total) by mouth 4 (four) times daily. 05/12/21 06/11/21  Mercy Riding, MD    Allergies    Carvedilol  Review of Systems   Review of Systems  Unable to perform ROS: Acuity of condition   Physical Exam Updated Vital Signs BP (!) 90/53   Pulse (!) 54   Resp 19   Ht 5' 9"  (1.753 m)   Wt 70 kg   SpO2 100%   BMI 22.79 kg/m   Physical Exam Vitals and nursing note reviewed.  Constitutional:      General: He is in acute distress.     Appearance: He is well-developed. He is ill-appearing.     Comments: Pale appearing, chronically ill-appearing, cachectic Hard of hearing  HENT:     Head: Normocephalic and atraumatic.     Mouth/Throat:     Pharynx: No oropharyngeal exudate.  Eyes:     Conjunctiva/sclera: Conjunctivae normal.     Pupils: Pupils are equal, round, and reactive to light.  Neck:     Comments: No meningismus. Cardiovascular:     Rate and Rhythm: Normal rate and regular rhythm.     Heart sounds: Normal heart sounds. No murmur heard.    Comments: Equal femoral pulses Pulmonary:     Effort: Pulmonary effort is normal. No respiratory distress.     Breath sounds: Normal breath  sounds.  Abdominal:     Palpations: Abdomen is soft.     Tenderness: There is abdominal tenderness. There is guarding. There is no rebound.  Musculoskeletal:        General: No tenderness. Normal range of motion.     Cervical back: Normal range of motion and neck supple.  Skin:    General: Skin is warm.  Neurological:     Mental Status: He is alert and oriented to person, place, and time.     Cranial Nerves: No cranial nerve deficit.     Motor: No abnormal muscle tone.     Coordination: Coordination normal.     Comments:  5/5 strength throughout. CN 2-12 intact.Equal grip strength.   Psychiatric:        Behavior: Behavior normal.    ED Results / Procedures / Treatments   Labs (all labs ordered are listed, but only abnormal results are displayed) Labs Reviewed  CBC WITH DIFFERENTIAL/PLATELET - Abnormal; Notable for the following components:      Result Value   WBC 22.2 (*)    RBC 2.41 (*)    Hemoglobin 7.5 (*)    HCT 22.7 (*)    RDW 15.8 (*)    Neutro Abs 20.8 (*)    Lymphs Abs 0.3 (*)    Abs Immature Granulocytes 0.20 (*)    All other components within normal limits  COMPREHENSIVE METABOLIC PANEL - Abnormal; Notable for the following components:   Sodium 131 (*)    CO2 19 (*)    Glucose, Bld 142 (*)    Creatinine, Ser 1.43 (*)    Calcium 7.9 (*)  Total Protein 4.8 (*)    Albumin 1.9 (*)    Alkaline Phosphatase 257 (*)    GFR, Estimated 50 (*)    All other components within normal limits  BRAIN NATRIURETIC PEPTIDE - Abnormal; Notable for the following components:   B Natriuretic Peptide 307.7 (*)    All other components within normal limits  PROTIME-INR - Abnormal; Notable for the following components:   Prothrombin Time 17.5 (*)    INR 1.4 (*)    All other components within normal limits  LACTIC ACID, PLASMA - Abnormal; Notable for the following components:   Lactic Acid, Venous 4.0 (*)    All other components within normal limits  HEMOGLOBIN A1C - Abnormal;  Notable for the following components:   Hgb A1c MFr Bld 6.0 (*)    All other components within normal limits  LIPID PANEL - Abnormal; Notable for the following components:   HDL 35 (*)    All other components within normal limits  I-STAT CHEM 8, ED - Abnormal; Notable for the following components:   Sodium 131 (*)    Creatinine, Ser 1.30 (*)    Glucose, Bld 138 (*)    Calcium, Ion 1.11 (*)    TCO2 18 (*)    Hemoglobin 7.8 (*)    HCT 23.0 (*)    All other components within normal limits  TROPONIN I (HIGH SENSITIVITY) - Abnormal; Notable for the following components:   Troponin I (High Sensitivity) 103 (*)    All other components within normal limits  RESP PANEL BY RT-PCR (FLU A&B, COVID) ARPGX2  LIPASE, BLOOD  APTT  AMMONIA  LACTIC ACID, PLASMA  OCCULT BLOOD GASTRIC / DUODENUM (SPECIMEN CUP)  POC OCCULT BLOOD, ED  TYPE AND SCREEN  PREPARE RBC (CROSSMATCH)  TROPONIN I (HIGH SENSITIVITY)    EKG EKG Interpretation  Date/Time:  Sunday May 23 2021 04:00:51 EDT Ventricular Rate:  66 PR Interval:    QRS Duration: 134 QT Interval:  454 QTC Calculation: 476 R Axis:   112 Text Interpretation: Junctional rhythm Nonspecific intraventricular conduction delay Inferior infarct, acute (RCA) Anterior infarct, old Lateral leads are also involved Probable RV involvement, suggest recording right precordial leads >>> Acute MI <<< inferior ST elevation Confirmed by Ezequiel Essex [phone]) on 05/23/2021 4:43:54 AM  Radiology CT HEAD WO CONTRAST (5MM)  Result Date: 05/23/2021 CLINICAL DATA:  75 year old male found down in bathroom. Altered mental status, hypotension. EXAM: CT HEAD WITHOUT CONTRAST TECHNIQUE: Contiguous axial images were obtained from the base of the skull through the vertex without intravenous contrast. COMPARISON:  Brain MRI 07/29/2017.  Head CT 03/04/2021. FINDINGS: Brain: No midline shift, ventriculomegaly, mass effect, evidence of mass lesion, intracranial hemorrhage or  evidence of cortically based acute infarction. Patchy bilateral white matter hypodensity appears stable since June, slightly greater in the left hemisphere. Stable gray-white matter differentiation throughout the brain. Vascular: Calcified atherosclerosis at the skull base. No suspicious intracranial vascular hyperdensity. Skull: No acute osseous abnormality identified. Sinuses/Orbits: Mild left mastoid air cell effusion. But other Visualized paranasal sinuses and mastoids are clear. Left tympanic cavity is clear. Other: Negative nasopharynx contour. No acute orbit soft tissue finding. Left forehead scalp hematoma measures about 5 mm in thickness. Underlying calvarium is intact. No scalp soft tissue gas. IMPRESSION: 1. Left forehead scalp hematoma without underlying skull fracture. 2. No acute intracranial abnormality. Stable CT appearance of white matter disease since June. 3. Mild left mastoid air cell effusion, significance doubtful. Electronically Signed   By: Lemmie Evens  [place] Crane M.D.   On: 05/23/2021 04:30   DG Chest Portable 1 View  Result Date: 05/23/2021 CLINICAL DATA:  75 year old male with chest pain.  Found down. EXAM: PORTABLE CHEST 1 VIEW COMPARISON:  Chest radiographs 04/26/2021 and earlier. FINDINGS: Portable AP semi upright view at 0431 hours. Chronic pulmonary hyperinflation but lower lung volumes today. Stable mediastinal contours, sequelae of TAVR. Calcified aortic atherosclerosis. Visualized tracheal air column is within normal limits. No pneumothorax, pulmonary edema, pleural effusion or confluent pulmonary opacity. External liquid contrast projects over the patient's left chest and shoulder. No acute osseous abnormality identified. Negative visible bowel gas pattern. IMPRESSION: Lower lung volumes.  No acute cardiopulmonary abnormality. Electronically Signed   By: Genevie Ann M.D.   On: 05/23/2021 04:58   CT Angio Chest/Abd/Pel for Dissection W and/or Wo Contrast  Result Date: 05/23/2021 CLINICAL DATA:   75 year old male found down. Hypotensive. STEMI. 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 EXAM: CT ANGIOGRAPHY CHEST, ABDOMEN AND PELVIS TECHNIQUE: Non-contrast CT of the chest was initially obtained. Multidetector CT imaging through the chest, abdomen and pelvis was performed using the standard protocol during bolus administration of intravenous contrast. Multiplanar reconstructed images and MIPs were obtained and reviewed to evaluate the vascular anatomy. CONTRAST:  11m OMNIPAQUE IOHEXOL 350 MG/ML SOLN COMPARISON:  CT Chest, Abdomen, and Pelvis 12/25/2017. CT abdomen 10/15/2020. CT Abdomen and Pelvis 12/30/2019. FINDINGS: CTA CHEST FINDINGS Cardiovascular: Prior TAVR. Calcified aortic atherosclerosis. Calcified coronary artery atherosclerosis. Negative for thoracic aortic aneurysm or dissection. Atherosclerosis of the proximal great vessels which remain patent, although there does appear to be high-grade left subclavian artery stenosis on series 7, image 24. Cardiac size has increased since 2019 with mild cardiomegaly now. No pericardial effusion. The central pulmonary arteries are also enhanced and appear patent. No pulmonary embolus is identified. Mediastinum/Nodes: No mediastinal mass or lymphadenopathy. However, there is soft tissue edema or inflammation in the lower mediastinum just above the diaphragm. See additional abdominal findings below. Lungs/Pleura: Small bilateral layering pleural effusions, with simple fluid density. Major airways are patent. A chronic right lower lobe lung nodule measuring 9-10 mm on series 6, image 39 is stable since 2019 and benign. Occasional upper lobe centrilobular emphysema. Mild dependent atelectasis but no consolidation or pneumonia. Musculoskeletal: Osteopenia. Flowing endplate osteophytes in the thoracic spine resulting in ankylosis. No acute or suspicious osseous lesion in the chest. Review of the MIP images  confirms the above findings. CTA ABDOMEN AND PELVIS FINDINGS VASCULAR Extensive Aortoiliac calcified atherosclerosis. Negative for abdominal aortic aneurysm or dissection. Sequelae of left nephrectomy with calcified, occluded left renal artery stump. The other major aortic branches remain patent. There is delayed arterial bolus timing in the pelvis. 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 And there is a superimposed separate saccular aneurysm at the right iliac bifurcation which is 30 mm now versus 24 mm last year. Extensive underlying bilateral calcified iliac artery atherosclerosis, but iliac artery patency is poorly evaluated due to delayed contrast bolus. Bilateral femoral artery calcification also noted. Review of the MIP images confirms the above findings. NON-VASCULAR Hepatobiliary: Low-density perihepatic ascites. Stable chronic pneumobilia. No discrete liver lesion. Pancreas: Status post Whipple. Spleen: Perisplenic ascites, otherwise negative. Adrenals/Urinary Tract: Status post left oophorectomy. There is right renal enhancement. No right hydronephrosis. Unremarkable bladder. Stomach/Bowel: Bowel detail is limited by anasarca and ascites. Severe diverticulosis of the sigmoid colon is redemonstrated. There is no dilated large or small bowel.  Sequelae of Whipple with gastric jejunostomy in the left abdomen. Ascites, but no definite pneumoperitoneum. Lymphatic: No lymphadenopathy is evident although detail is limited by anasarca. Reproductive: Negative. Other: Generalized body wall edema is new since last year. And there is new simple fluid density ascites throughout the abdomen and pelvis, volume is small to moderate. Musculoskeletal: Chronic L2 compression fracture is stable since 2019. Chronic lumbar facet arthropathy. No acute or suspicious osseous lesion. Review of the MIP images confirms the above findings. IMPRESSION: 1. Extensive aortic atherosclerosis but  negative for Aortic dissection or aneurysm. Prior TAVR. No pulmonary embolus identified. 2. But Positive for delayed contrast transit, such that the pelvic and proximal femoral arteries are unenhanced and not well evaluated (see also #5). This may indicate acute heart failure, and there is New Cardiomegaly since last year. 3. Positive also for new generalized Anasarca, and small to moderate volume Ascites. There are trace layering pleural effusions. 4. No pneumonia, but limited detail of abdominal viscera secondary to combination of #2 and #3. Prior Whipple, and left nephrectomy. No bowel obstruction or free air. 5. 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 6. No acute osseous abnormality identified. Chronic L2 compression fracture. Electronically Signed   By: Genevie Ann M.D.   On: 05/23/2021 05:16    Procedures .Critical Care Performed by: Ezequiel Essex, MD Authorized by: Ezequiel Essex, MD   Critical care provider statement:    Critical care time (minutes):  90   Critical care was necessary to treat or prevent imminent or life-threatening deterioration of the following conditions:  Cardiac failure, shock and circulatory failure   Critical care was time spent personally by me on the following activities:  Discussions with consultants, evaluation of patient's response to treatment, examination of patient, ordering and performing treatments and interventions, ordering and review of laboratory studies, ordering and review of radiographic studies, pulse oximetry, re-evaluation of patient's condition, obtaining history from patient or surrogate and review of old charts   Medications Ordered in ED Medications  aspirin suppository 300 mg (has no administration in time range)  0.9 %  sodium chloride infusion (has no administration in time range)  sodium chloride 0.9 % bolus  1,000 mL (has no administration in time range)    ED Course  I have reviewed the triage vital signs and the nursing notes.  Pertinent labs & imaging results that were available during my care of the patient were reviewed by me and considered in my medical decision making (see chart for details).    MDM Rules/Calculators/A&P                           Code STEMI presents via EMS.  He appears chronically ill with recent hospitalization for GI bleed.  He is hard of hearing.  Complains of chest pain as well as abdominal pain and back pain.  Seen at bedside with cardiology team Dr. Renella Cunas and Dr. Burt Knack. Given history of recent GI bleed he may not be a candidate for catheterization.  We will proceed with CT head given his altered mental status as well as CT angiogram to evaluate for dissection given abdominal pain and back pain prior to catheterization  Dr. Burt Knack of interventional cardiology has seen patient at bedside.  He spoke with the patient's wife.  Given his comorbidities and recent illness  and GI bleed and overall debilitation, he does not feel patient would benefit from emergent catheterization or heparinization. He is recommending a palliative care route.  Blood pressure soft in the 70s and 80s.  Patient started on IV fluid resuscitation.  Also initiate blood transfusion given drop in hemoglobin.  CTA does not show any acute aortic pathology or dissection Does show new anasarca and unenhanced femoral arteries with concern for heart failure.  Labs show elevated lactate of 4.  Hemoglobin has down trended to 7.5.  He is initiated on IV blood transfusion after discussion with his wife at bedside.  She is waiting on additional family members to make decisions regarding his CODE STATUS of patient remains a full code at this time.  Per discussion with cardiology above he is not a candidate for catheterization or IV heparin or antiplatelet agents given his recent GI bleed. Patient in poor  general health and cachectic on exam. He is high risk for recurrent GI bleeding.  They are recommending palliative care consultation.  ICU admission discussed with Dr. Carson Myrtle critical care team.  He remains a full code at this time Final Clinical Impression(s) / ED Diagnoses Final diagnoses:  ST elevation myocardial infarction involving right coronary artery [facility])  Cardiogenic shock [facility])    Rx / DC Orders ED Discharge Orders     None        Ezequiel Essex, MD 05/23/21 [phone]

## 2021-05-23 NOTE — ED Notes (Signed)
Returned from CT scan , cardiologist at bedside , denies chest pain/respirations unlabored ,IV site intact .

## 2021-05-23 NOTE — ED Notes (Signed)
1st unit PRBC infusing at 120 ml/hr. , IV site intact , denies pain /respirations unlabored , family at bedside ICU admitting MD explained plan of care to family .

## 2021-05-24 ENCOUNTER — Telehealth: Payer: Self-pay

## 2021-05-24 DIAGNOSIS — R400 Somnolence: Secondary | ICD-10-CM

## 2021-05-24 DIAGNOSIS — I2101 ST elevation (STEMI) myocardial infarction involving left main coronary artery: Secondary | ICD-10-CM

## 2021-05-24 MED ORDER — ISOSORBIDE MONONITRATE ER 30 MG PO TB24: 30.0000 mg | ORAL_TABLET | Freq: Every day | ORAL | 0 refills | Status: AC

## 2021-05-24 MED ORDER — ONDANSETRON HCL 4 MG PO TABS: 4.0000 mg | ORAL_TABLET | Freq: Three times a day (TID) | ORAL | 0 refills | Status: AC | PRN

## 2021-05-24 NOTE — Telephone Encounter (Signed)
Yes that is ok.  Manuel Olson. [name], MD 05/24/2021 9:57 AM

## 2021-05-24 NOTE — ED Notes (Signed)
[facility] called. Placed on humidified O2 4L.

## 2021-05-24 NOTE — ED Notes (Signed)
Breakfast Orders Placed °

## 2021-05-24 NOTE — Discharge Planning (Signed)
RNCM contacted pt spouse [name]) to confirm [facility] has been secured as disposition plan.  [name] was in conversations with them when I called and making final arrangements to coordinate RN intake with pt arrival at home.  RNCM will update team.

## 2021-05-24 NOTE — Telephone Encounter (Signed)
Cassandra from [facility] is calling to verify dr .[name] is ok to sign off being the attending  Please call [phone]   Also patient had a heart attack and is being discharged from [facility] today

## 2021-05-24 NOTE — Telephone Encounter (Signed)
Returned Manuel Olson's call and gave below information.

## 2021-05-24 NOTE — Telephone Encounter (Signed)
See below

## 2021-05-24 NOTE — Discharge Summary (Signed)
[name] DOB: 07/24/1946 DOA: 05/23/2021  PCP: [name], MD  Admit date: 05/23/2021  Discharge date: 05/24/2021  Admitted From: Home  Disposition:  Home with Hospice   Recommendations for Outpatient Follow-up:   Follow up with PCP in 1-2 weeks  PCP Please obtain BMP/CBC, 2 view CXR in 1week,  (see Discharge instructions)   PCP Please follow up on the following pending results:    Home Health: Hospice   Equipment/Devices: as below  Consultations: Cards, PCCM, Pall. Care Discharge Condition: Stable    CODE STATUS: Full    Diet Recommendation: Heart Healthy     Chief Complaint  Patient presents with   STEMI / Fall     Brief history of present illness from the day of admission and additional interim summary and brief ER stay.   75 y.o. male  with past medical history of coronary artery disease status post prior PCI, severe aortic stenosis status post TAVR, pancreatic cancer status post Whipple and chemotherapy, and CKD stage IIIa. He presented to the emergency department on 05/23/2021 after a near syncopal episode and altered mental status.  On arrival to the ER, he was found to be hypotensive and transiently required vasopressor support. EKG was consistent with acute inferoposterior wall myocardial infarction. Code STEMI was called and he was seen by cardiology who has determined he is not a good candidate for invasive interventions. He is also unable to tolerate antithrombotic/antiplatelet therapy due to recent GI bleed, he was seen by [facility], after multiple detailed discussions between treatment teams and family it was decided and patient will be discharged home on home with hospice, arrangements were made, all orders were placed.  He was to be transferred this  morning under hospice care he was transferred to my service this morning when he is ready to be transported.  Briefly saw the patient discussed the plan with the family at bedside they are all agreeable that he is going home with home hospice with the goal of care is comfort only.  He is DNR.  We will discharge him with home hospice.  Transport team has already arrived to the ER.  He will continue comfort medications along with home medications as tolerated.  Goal of care is comfort only.      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  Home with  hospice Condition.  Guarded CODE STATUS.  DNR Activity.  With assistance as tolerated, full fall precautions. Diet.  Soft with feeding assistance and aspiration precautions. Goal of care.  Comfort.  If still taking Insulin then do Accuchecks 4 times/day, Once in AM empty stomach and then before each meal. Log in all results and show them to your Prim.MD in 3 days. If any glucose reading is under 80 or above 300 call your Prim MD immidiately. Follow Low glucose instructions for glucose under 80 as instructed.       Discharge Medications   Allergies as of 05/24/2021       Reactions   Carvedilol Other (See Comments)   "Could not function when taking this"        Medication List     TAKE these medications    acetaminophen 325 MG tablet Commonly known as: TYLENOL Take  325-650 mg by mouth every 6 (six) hours as needed for mild pain.   atorvastatin 20 MG tablet Commonly known as: LIPITOR Take 1 tablet (20 mg total) by mouth daily.   Basaglar KwikPen 100 UNIT/ML Inject 8 Units into the skin daily.   diphenoxylate-atropine 2.5-0.025 MG tablet Commonly known as: LOMOTIL Take 1 tablet by mouth daily at 2 PM.   feeding supplement Liqd Take 237 mLs by mouth 2 (two) times daily between meals. What changed:  when to take this reasons to take this   FreeStyle Libre 14 Day Sensor Misc Inject 1 patch into the skin every 14 (fourteen) days.   insulin lispro 100 UNIT/ML KwikPen Commonly known as: HumaLOG KwikPen Inject 5-8 Units into the skin 3 (three) times daily before meals. Inject 5-8 units 3 times a day with meals. What changed:  how much to take additional instructions   isosorbide mononitrate 30 MG 24 hr tablet Commonly known as: IMDUR Take 1 tablet (30 mg total) by mouth daily.   lipase/protease/amylase 36000 UNITS Cpep capsule Commonly known as: Creon Take 2 capsules (72,000 Units total) by mouth 3 (three) times daily before meals. Pt taking 2 tab before meals.   LORazepam 1 MG tablet Commonly known as: ATIVAN Take 0.5-1 tablets (0.5-1 mg total) by mouth every 4 (four) hours as needed for anxiety.   losartan 25 MG tablet Commonly known as: COZAAR Take 0.5 tablets (12.5 mg total) by mouth daily.   multivitamin with minerals Tabs tablet Take 1 tablet by mouth daily.   nitroGLYCERIN 0.4 MG SL tablet Commonly known as: NITROSTAT Place 1 tablet (0.4 mg total) under the tongue every 5 (five) minutes x 3 doses as needed for chest pain (if no relief after 2nd dose, proceed to the ED for an evaluation or call 911).   ondansetron 4 MG tablet Commonly known as: Zofran Take 1 tablet (4 mg total) by mouth every 8 (eight) hours as needed for nausea or vomiting.   oxyCODONE 5 MG immediate release tablet Commonly known as: Roxicodone Take 1  tablet (5 mg total) by mouth every 4 (four) hours as needed for severe pain or moderate pain (or dyspnea).   pantoprazole 40 MG tablet Commonly known as: PROTONIX Take 1 tablet (40 mg total) by mouth 2 (two) times daily.   Pen Needles 31G X 6 MM Misc Use as directed to check blood sugar 4 times a day.   sucralfate 1 g tablet Commonly known as: Carafate Take 1 tablet (1 g total) by mouth 4 (four) times daily.  Durable Medical Equipment  (From admission, onward)           Start     Ordered   05/24/21 0846  For home use only DME lightweight manual wheelchair with seat cushion  Once       Comments: Patient suffers from terminal CAD, ASwhich impairs their ability to perform daily activities like bathing, dressing, feeding, grooming, and toileting in the home.  A cane, crutch, or walker will not resolve  issue with performing activities of daily living. A wheelchair will allow patient to safely perform daily activities. Patient is not able to propel themselves in the home using a standard weight wheelchair due to arm weakness, endurance, and general weakness. Patient can self propel in the lightweight wheelchair. Length of need 6 months . Accessories: elevating leg rests (ELRs), wheel locks, extensions and anti-tippers.   05/24/21 0846              Major procedures and Radiology Reports - PLEASE review detailed and final reports thoroughly  -       CT HEAD WO CONTRAST (5MM)  Result Date: 05/23/2021 CLINICAL DATA:  75 year old male found down in bathroom. Altered mental status, hypotension. EXAM: CT HEAD WITHOUT CONTRAST TECHNIQUE: Contiguous axial images were obtained from the base of the skull through the vertex without intravenous contrast. COMPARISON:  Brain MRI 07/29/2017.  Head CT 03/04/2021. FINDINGS: Brain: No midline shift, ventriculomegaly, mass effect, evidence of mass lesion, intracranial hemorrhage or evidence of cortically based acute infarction.  Patchy bilateral white matter hypodensity appears stable since June, slightly greater in the left hemisphere. Stable gray-white matter differentiation throughout the brain. Vascular: Calcified atherosclerosis at the skull base. No suspicious intracranial vascular hyperdensity. Skull: No acute osseous abnormality identified. Sinuses/Orbits: Mild left mastoid air cell effusion. But other Visualized paranasal sinuses and mastoids are clear. Left tympanic cavity is clear. Other: Negative nasopharynx contour. No acute orbit soft tissue finding. Left forehead scalp hematoma measures about 5 mm in thickness. Underlying calvarium is intact. No scalp soft tissue gas. IMPRESSION: 1. Left forehead scalp hematoma without underlying skull fracture. 2. No acute intracranial abnormality. Stable CT appearance of white matter disease since June. 3. Mild left mastoid air cell effusion, significance doubtful. Electronically Signed   By: Genevie Ann M.D.   On: 05/23/2021 04:30   NM GI Blood Loss  Result Date: 04/28/2021 CLINICAL DATA:  GI bleed. Large volume of blood per rectum last night. Right lower quadrant abdominal pain. EXAM: NUCLEAR MEDICINE GASTROINTESTINAL BLEEDING SCAN TECHNIQUE: Sequential abdominal images were obtained following intravenous administration of Tc-51mlabeled red blood cells. RADIOPHARMACEUTICALS:  20.8 mCi Tc-978mertechnetate in-vitro labeled red cells. COMPARISON:  Abdominopelvic CT 05/04/2021. FINDINGS: Imaging through 2 hours demonstrates no evidence of active gastrointestinal bleeding. There is normal blood pool and hepatic activity. Mildly prominent activity in the left upper quadrant does not move and likely is gastric. Some bladder activity is noted on the delayed images. IMPRESSION: No evidence of active gastrointestinal bleeding. Electronically Signed   By: WiRichardean Sale.D.   On: 04/28/2021 16:30   DG Chest Portable 1 View  Result Date: 05/23/2021 CLINICAL DATA:  [age]ear old male with chest  pain.  Found down. EXAM: PORTABLE CHEST 1 VIEW COMPARISON:  Chest radiographs 04/26/2021 and earlier. FINDINGS: Portable AP semi upright view at 0431 hours. Chronic pulmonary hyperinflation but lower lung volumes today. Stable mediastinal contours, sequelae of TAVR. Calcified aortic atherosclerosis. Visualized tracheal air column is within normal limits. No pneumothorax, pulmonary edema, pleural effusion  or confluent pulmonary opacity. External liquid contrast projects over the patient's left chest and shoulder. No acute osseous abnormality identified. Negative visible bowel gas pattern. IMPRESSION: Lower lung volumes.  No acute cardiopulmonary abnormality. Electronically Signed   By: Genevie Ann M.D.   On: 05/23/2021 04:58   DG Chest Portable 1 View  Result Date: 04/26/2021 CLINICAL DATA:  Shortness of breath. Pt is here for weakness and dizziness from cancer center EXAM: PORTABLE CHEST 1 VIEW COMPARISON:  Chest x-ray 03/04/2021. FINDINGS: The heart size and mediastinal contours are unchanged. Status post aortic valve replacement. Aortic calcification. Hyperinflation. No focal consolidation. Coarsened interstitial markings with no overt pulmonary edema. No pleural effusion. No pneumothorax. No acute osseous abnormality. IMPRESSION: 1. No active disease. 2. Aortic Atherosclerosis (ICD10-I70.0) and Emphysema (ICD10-J43.9). Electronically Signed   By: Iven Finn M.D.   On: 04/26/2021 16:37   CT Angio Chest/Abd/Pel for Dissection W and/or Wo Contrast  Result Date: 05/23/2021 CLINICAL DATA:  75 year old male found down. Hypotensive. STEMI. 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 EXAM: CT ANGIOGRAPHY CHEST, ABDOMEN AND PELVIS TECHNIQUE: Non-contrast CT of the chest was initially obtained. Multidetector CT imaging through the chest, abdomen and pelvis was performed using the standard protocol during bolus administration of intravenous contrast. Multiplanar  reconstructed images and MIPs were obtained and reviewed to evaluate the vascular anatomy. CONTRAST:  164m OMNIPAQUE IOHEXOL 350 MG/ML SOLN COMPARISON:  CT Chest, Abdomen, and Pelvis 12/25/2017. CT abdomen 10/15/2020. CT Abdomen and Pelvis 12/30/2019. FINDINGS: CTA CHEST FINDINGS Cardiovascular: Prior TAVR. Calcified aortic atherosclerosis. Calcified coronary artery atherosclerosis. Negative for thoracic aortic aneurysm or dissection. Atherosclerosis of the proximal great vessels which remain patent, although there does appear to be high-grade left subclavian artery stenosis on series 7, image 24. Cardiac size has increased since 2019 with mild cardiomegaly now. No pericardial effusion. The central pulmonary arteries are also enhanced and appear patent. No pulmonary embolus is identified. Mediastinum/Nodes: No mediastinal mass or lymphadenopathy. However, there is soft tissue edema or inflammation in the lower mediastinum just above the diaphragm. See additional abdominal findings below. Lungs/Pleura: Small bilateral layering pleural effusions, with simple fluid density. Major airways are patent. A chronic right lower lobe lung nodule measuring 9-10 mm on series 6, image 39 is stable since 2019 and benign. Occasional upper lobe centrilobular emphysema. Mild dependent atelectasis but no consolidation or pneumonia. Musculoskeletal: Osteopenia. Flowing endplate osteophytes in the thoracic spine resulting in ankylosis. No acute or suspicious osseous lesion in the chest. Review of the MIP images confirms the above findings. CTA ABDOMEN AND PELVIS FINDINGS VASCULAR Extensive Aortoiliac calcified atherosclerosis. Negative for abdominal aortic aneurysm or dissection. Sequelae of left nephrectomy with calcified, occluded left renal artery stump. The other major aortic branches remain patent. There is delayed arterial bolus timing in the pelvis. 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  And there is a superimposed separate saccular aneurysm at the right iliac bifurcation which is 30 mm now versus 24 mm last year. Extensive underlying bilateral calcified iliac artery atherosclerosis, but iliac artery patency is poorly evaluated due to delayed contrast bolus. Bilateral femoral artery calcification also noted. Review of the MIP images confirms the above findings. NON-VASCULAR Hepatobiliary: Low-density perihepatic ascites. Stable chronic pneumobilia. No discrete liver lesion. Pancreas: Status post Whipple. Spleen: Perisplenic ascites, otherwise negative. Adrenals/Urinary Tract: Status post left oophorectomy. There is right renal enhancement. No right hydronephrosis. Unremarkable bladder. Stomach/Bowel: Bowel detail is limited by  anasarca and ascites. Severe diverticulosis of the sigmoid colon is redemonstrated. There is no dilated large or small bowel. Sequelae of Whipple with gastric jejunostomy in the left abdomen. Ascites, but no definite pneumoperitoneum. Lymphatic: No lymphadenopathy is evident although detail is limited by anasarca. Reproductive: Negative. Other: Generalized body wall edema is new since last year. And there is new simple fluid density ascites throughout the abdomen and pelvis, volume is small to moderate. Musculoskeletal: Chronic L2 compression fracture is stable since 2019. Chronic lumbar facet arthropathy. No acute or suspicious osseous lesion. Review of the MIP images confirms the above findings. IMPRESSION: 1. Extensive aortic atherosclerosis but negative for Aortic dissection or aneurysm. Prior TAVR. No pulmonary embolus identified. 2. But Positive for delayed contrast transit, such that the pelvic and proximal femoral arteries are unenhanced and not well evaluated (see also #5). This may indicate acute heart failure, and there is New Cardiomegaly since last year. 3. Positive also for new generalized Anasarca, and small to moderate volume Ascites. There are trace layering  pleural effusions. 4. No pneumonia, but limited detail of abdominal viscera secondary to combination of #2 and #3. Prior Whipple, and left nephrectomy. No bowel obstruction or free air. 5. 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 6. No acute osseous abnormality identified. Chronic L2 compression fracture. Electronically Signed   By: Genevie Ann M.D.   On: 05/23/2021 05:16    Micro Results    Recent Results (from the past 240 hour(s))  Resp Panel by RT-PCR (Flu A&B, Covid) Nasopharyngeal Swab     Status: None   Collection Time: 05/23/21  4:05 AM   Specimen: Nasopharyngeal Swab; Nasopharyngeal(NP) swabs in vial transport medium  Result Value Ref Range Status   SARS Coronavirus 2 by RT PCR NEGATIVE NEGATIVE Final    Comment: (NOTE) SARS-CoV-2 target nucleic acids are NOT DETECTED.  The SARS-CoV-2 RNA is generally detectable in upper respiratory specimens during the acute phase of infection. The lowest concentration of SARS-CoV-2 viral copies this assay can detect is 138 copies/mL. A negative result does not preclude SARS-Cov-2 infection and should not be used as the sole basis for treatment or other patient management decisions. A negative result may occur with  improper specimen collection/handling, submission of specimen other than nasopharyngeal swab, presence of viral mutation(s) within the areas targeted by this assay, and inadequate number of viral copies(<138 copies/mL). A negative result must be combined with clinical observations, patient history, and epidemiological information. The expected result is Negative.  Fact Sheet for Patients:  EntrepreneurPulse.com.au  Fact Sheet for Healthcare Providers:  IncredibleEmployment.be  This test is no t yet approved or cleared by the [place] FDA and  has been authorized  for detection and/or diagnosis of SARS-CoV-2 by FDA under an Emergency Use Authorization (EUA). This EUA will remain  in effect (meaning this test can be used) for the duration of the COVID-19 declaration under Section 564(b)(1) of the Act, 21 U.S.C.section 360bbb-3(b)(1), unless the authorization is terminated  or revoked sooner.       Influenza A by PCR NEGATIVE NEGATIVE Final   Influenza B by PCR NEGATIVE NEGATIVE Final    Comment: (NOTE) The Xpert Xpress SARS-CoV-2/FLU/RSV plus assay is intended as an aid in the diagnosis of influenza from Nasopharyngeal swab specimens and should not be used as a sole basis for treatment. Nasal washings and aspirates are  unacceptable for Xpert Xpress SARS-CoV-2/FLU/RSV testing.  Fact Sheet for Patients: EntrepreneurPulse.com.au  Fact Sheet for Healthcare Providers: IncredibleEmployment.be  This test is not yet approved or cleared by the [place] FDA and has been authorized for detection and/or diagnosis of SARS-CoV-2 by FDA under an Emergency Use Authorization (EUA). This EUA will remain in effect (meaning this test can be used) for the duration of the COVID-19 declaration under Section 564(b)(1) of the Act, 21 U.S.C. section 360bbb-3(b)(1), unless the authorization is terminated or revoked.  Performed at [facility], [place] [address], [place], [place] [address]     Today   Subjective    Manuel Olson today has no headache,no chest abdominal pain,no new weakness tingling or numbness, feels much better wants to go home today.    Objective   Blood pressure (!) 155/85, pulse 85, temperature (!) 97.2 F (36.2 C), temperature source Temporal, resp. rate (!) 23, height 5' 9"  (1.753 m), weight 70 kg, SpO2 97 %.   Intake/Output Summary (Last 24 hours) at 05/24/2021 0855 Last data filed at 05/23/2021 1001 Gross per 24 hour  Intake 16.58 ml  Output --  Net 16.58 ml    Exam  Awake  Alert, No new F.N deficits, Normal affect [place].AT,PERRAL Supple Neck,No JVD, No cervical lymphadenopathy appriciated.  Symmetrical Chest wall movement, Good air movement bilaterally, CTAB RRR,No Gallops,Rubs or new Murmurs, No Parasternal Heave +ve B.Sounds, Abd Soft, Non tender, No organomegaly appriciated, No rebound -guarding or rigidity. No Cyanosis, Clubbing or edema, No new Rash or bruise   Data Review   CBC w Diff:  Lab Results  Component Value Date   WBC 22.2 (H) 05/23/2021   HGB 8.9 (L) 05/23/2021   HGB 5.9 (LL) 04/26/2021   HGB 12.7 (L) 09/07/2017   HCT 29.0 (L) 05/23/2021   HCT 38.7 09/07/2017   PLT 301 05/23/2021   PLT 358 04/26/2021   PLT 161 09/07/2017   LYMPHOPCT 1 05/23/2021   LYMPHOPCT 16.6 09/07/2017   MONOPCT 4 05/23/2021   MONOPCT 11.8 09/07/2017   EOSPCT 0 05/23/2021   EOSPCT 4.8 09/07/2017   BASOPCT 0 05/23/2021   BASOPCT 0.8 09/07/2017    CMP:  Lab Results  Component Value Date   NA 131 (L) 05/23/2021   NA 139 09/07/2017   K 3.6 05/23/2021   K 5.4 No visable hemolysis (H) 09/07/2017   CL 98 05/23/2021   CO2 19 (L) 05/23/2021   CO2 27 09/07/2017   BUN 20 05/23/2021   BUN 29.8 (H) 09/07/2017   CREATININE 1.30 (H) 05/23/2021   CREATININE 1.67 (H) 01/09/2020   CREATININE 1.2 09/07/2017   PROT 4.8 (L) 05/23/2021   PROT 6.9 09/07/2017   ALBUMIN 1.9 (L) 05/23/2021   ALBUMIN 3.9 09/07/2017   BILITOT 1.0 05/23/2021   BILITOT 0.2 (L) 01/09/2020   BILITOT 0.37 09/07/2017   ALKPHOS 257 (H) 05/23/2021   ALKPHOS 89 09/07/2017   AST 25 05/23/2021   AST 16 01/09/2020   AST 13 09/07/2017   ALT 14 05/23/2021   ALT 14 01/09/2020   ALT 12 09/07/2017  .   Total Time in preparing paper work, data evaluation and todays exam - 43 minutes  Lala Lund M.D on 05/24/2021 at 8:55 AM  [facility]

## 2021-05-24 NOTE — Discharge Instructions (Signed)
Disposition.  Home with hospice Condition.  Guarded CODE STATUS.  DNR Activity.  With assistance as tolerated, full fall precautions. Diet.  Soft with feeding assistance and aspiration precautions. Goal of care.  Comfort.  If still taking Insulin then do Accuchecks 4 times/day, Once in AM empty stomach and then before each meal. Log in all results and show them to your Prim.MD in 3 days. If any glucose reading is under 80 or above 300 call your Prim MD immidiately. Follow Low glucose instructions for glucose under 80 as instructed.

## 2021-05-24 NOTE — ED Notes (Signed)
Family x3 at [facility]. Pending disposition/ d/c,  TOC plan.

## 2021-05-25 ENCOUNTER — Telehealth: Payer: Self-pay | Admitting: Pharmacist

## 2021-05-25 LAB — BPAM RBC
Blood Product Expiration Date: 202210052359
Blood Product Expiration Date: 202210052359
ISSUE DATE / TIME: 202209110534
Unit Type and Rh: 6200
Unit Type and Rh: 6200

## 2021-05-25 LAB — TYPE AND SCREEN
ABO/RH(D): A POS
Antibody Screen: NEGATIVE
Unit division: 0
Unit division: 0

## 2021-05-25 NOTE — Chronic Care Management (AMB) (Addendum)
Chronic Care Management Pharmacy Assistant   Name: Manuel Olson  MRN: [id number] DOB: 24-Jan-1946   Reason for Encounter: Disease State call for HTN   Recent office visits:  None since 04/06/21  Recent consult visits:  04/20/21 Oncology, [name] MD. No med changes. No follow ups. 04/26/21 Oncology, [name] NP. No med changes. No follow ups 05/12/21 Telephone message. Mickie Kay. Changed Lomotil 2.5-0.025 mg 1 tablet as needed to take 1 tablet every morning.  Hospital visits:   Medication Reconciliation was completed by comparing discharge summary, patient's EMR and Pharmacy list, and upon discussion with patient.  Admitted to the hospital on 04/26/21 due to [place]. Discharge date was 05/03/21. Discharged from [facility].    No medication changes   Medication Reconciliation was completed by comparing discharge summary, patient's EMR and Pharmacy list, and upon discussion with patient.  Admitted to the hospital on 05/09/21 due to Lower GI Bleed. Discharge date was 05/12/21. Discharged from [facility].    No medication changes  Medication Reconciliation was completed by comparing discharge summary, patient's EMR and Pharmacy list, and upon discussion with patient.  Admitted to the hospital on 05/23/21 due to ST Elevation myocardial infarction.  Discharge date was 05/24/21. Discharged from [facility]     Medications ordered in ED: Aspirin suppository 300 mg, 0.9% Sodium Chloride Infusion, 0.9% Sodium Chloride bolus 1,000 ml  Medications started in ED: Isosorbide Monoitrate 30 mg Daily, Lorazepam 0.5-1 mg every 4 hours as needed, Ondansetron HCI 4 mg every 8 hours as needed, Oxycodone HCI 5 mg every 4 hours as needed   Medications: Outpatient Encounter Medications as of 05/25/2021  Medication Sig Note   acetaminophen (TYLENOL) 325 MG tablet Take 325-650 mg by mouth every 6 (six) hours  as needed for mild pain.    atorvastatin (LIPITOR) 20 MG tablet Take 1 tablet (20 mg total) by mouth daily. 05/23/2021: LF at [facility] on 01/21/21. 90 Day Supply     Continuous Blood Gluc Sensor (FREESTYLE LIBRE 14 DAY SENSOR) MISC Inject 1 patch into the skin every 14 (fourteen) days.    diphenoxylate-atropine (LOMOTIL) 2.5-0.025 MG tablet Take 1 tablet by mouth daily at 2 PM. 05/23/2021: LF at [facility] on 05/12/21. 30 Day Supply    feeding supplement (ENSURE ENLIVE / ENSURE PLUS) LIQD Take 237 mLs by mouth 2 (two) times daily between meals. (Patient taking differently: Take 237 mLs by mouth 2 (two) times daily as needed (feeding suppliment).)    Insulin Glargine (BASAGLAR KWIKPEN) 100 UNIT/ML Inject 8 Units into the skin daily.    insulin lispro (HUMALOG KWIKPEN) 100 UNIT/ML KwikPen Inject 5-8 Units into the skin 3 (three) times daily before meals. Inject 5-8 units 3 times a day with meals. (Patient taking differently: Inject 2-3 Units into the skin 3 (three) times daily before meals. Sliding scale) 05/23/2021: LF at [facility] on 01/14/21. 63 Day Supply    Insulin Pen Needle (PEN NEEDLES) 31G X 6 MM MISC Use as directed to check blood sugar 4 times a day.    isosorbide mononitrate (IMDUR) 30 MG 24 hr tablet Take 1 tablet (30 mg total) by mouth daily.    lipase/protease/amylase (CREON) 36000 UNITS CPEP capsule Take 2 capsules (72,000 Units total) by mouth 3 (three) times daily before meals. Pt taking 2 tab before meals.    LORazepam (ATIVAN) 1 MG tablet Take 0.5-1 tablets (0.5-1 mg total) by mouth  every 4 (four) hours as needed for anxiety.    losartan (COZAAR) 25 MG tablet Take 0.5 tablets (12.5 mg total) by mouth daily. (Patient not taking: Reported on 05/23/2021) 05/23/2021: LF at [facility] on 02/18/21. 90 Day Supply    Multiple Vitamin (MULTIVITAMIN WITH MINERALS) TABS tablet Take 1 tablet by mouth daily. (Patient not taking: Reported on 05/23/2021)  05/09/2021: Not started   nitroGLYCERIN (NITROSTAT) 0.4 MG SL tablet Place 1 tablet (0.4 mg total) under the tongue every 5 (five) minutes x 3 doses as needed for chest pain (if no relief after 2nd dose, proceed to the ED for an evaluation or call 911). 05/23/2021: LF at [facility] on 01/15/21. 25 Day Supply    ondansetron (ZOFRAN) 4 MG tablet Take 1 tablet (4 mg total) by mouth every 8 (eight) hours as needed for nausea or vomiting.    oxyCODONE (ROXICODONE) 5 MG immediate release tablet Take 1 tablet (5 mg total) by mouth every 4 (four) hours as needed for severe pain or moderate pain (or dyspnea).    pantoprazole (PROTONIX) 40 MG tablet Take 1 tablet (40 mg total) by mouth 2 (two) times daily. 05/23/2021: LF at [facility] on 05/04/21. 30 Day Supply    sucralfate (CARAFATE) 1 g tablet Take 1 tablet (1 g total) by mouth 4 (four) times daily. 05/23/2021: LF at [facility] on 05/12/21. 30 Day Supply    No facility-administered encounter medications on file as of 05/25/2021.   Reviewed chart prior to disease state call. Spoke with patient regarding BP  Recent Office Vitals: BP Readings from Last 3 Encounters:  05/24/21 (!) 141/95  05/12/21 (!) 141/77  05/03/21 122/83   Pulse Readings from Last 3 Encounters:  05/24/21 83  05/12/21 86  05/03/21 84    Wt Readings from Last 3 Encounters:  05/23/21 154 lb 5.2 oz (70 kg)  05/09/21 114 lb 10.2 oz (52 kg)  04/29/21 115 lb 15.4 oz (52.6 kg)     Kidney Function Lab Results  Component Value Date/Time   CREATININE 1.30 (H) 05/23/2021 04:18 AM   CREATININE 1.43 (H) 05/23/2021 04:06 AM   CREATININE 1.67 (H) 01/09/2020 01:48 PM   CREATININE 1.67 (H) 08/27/2019 09:43 AM   CREATININE 1.2 09/07/2017 08:44 AM   GFR 45.03 (L) 02/16/2021 09:27 AM   GFRNONAA 50 (L) 05/23/2021 04:06 AM   GFRNONAA 39 (L) 01/09/2020 01:48 PM   GFRAA 45 (L) 01/09/2020 01:48 PM    BMP Latest Ref Rng & Units 05/23/2021 05/23/2021 05/11/2021   Glucose 70 - 99 mg/dL 138(H) 142(H) 81  BUN 8 - 23 mg/dL 20 23 13   Creatinine 0.61 - 1.24 mg/dL 1.30(H) 1.43(H) 1.07  Sodium 135 - 145 mmol/L 131(L) 131(L) 130(L)  Potassium 3.5 - 5.1 mmol/L 3.6 3.6 4.0  Chloride 98 - 111 mmol/L 98 100 97(L)  CO2 22 - 32 mmol/L - 19(L) 26  Calcium 8.9 - 10.3 mg/dL - 7.9(L) 8.9    Current antihypertensive regimen:  Losartan 25 mg Take 0.5 daily Imdur 30 mg daily    I spoke with pt wife and she stated he had a heart attack on 05/23/21 and went to the ED dept and they could not do anything for him. Wife stated they had to bring hospice in and stated they probably won't need our services going forward.    Care Gaps: Medicare Annual Wellness: last done on 10/09/19 Ophthalmology Exam: Next due on 11/25/21 Foot Exam: Never done  Hemoglobin  A1C: 05/23/21 (6.0) Colonoscopy: Not on list  Dexa Scan: N/A  Star Rating Drugs: Atorvastatin 20 mg last filled on 01/21/21 90ds Losartan 25 mg last filled on 02/18/21 [place]

## 2021-05-27 ENCOUNTER — Telehealth: Payer: Self-pay

## 2021-05-27 NOTE — Telephone Encounter (Signed)
Inbound fax requesting recent clinical notes. Clinical notes faxed to [facility] at [phone].

## 2021-05-31 ENCOUNTER — Telehealth: Payer: Self-pay

## 2021-05-31 NOTE — Telephone Encounter (Signed)
Received a call from [facility] patient passed away and wanted [place] to be aware that the certificate will be uploaded in the [facility].

## 2021-05-31 NOTE — Telephone Encounter (Signed)
Death certificate completed.  Algis Greenhouse. [name], MD 05/31/2021 3:41 PM

## 2021-06-12 DEATH — deceased

## 2021-06-15 ENCOUNTER — Ambulatory Visit: Payer: Medicare Other | Admitting: Oncology

## 2021-06-15 ENCOUNTER — Ambulatory Visit: Payer: Medicare Other | Admitting: Nurse Practitioner

## 2021-06-15 ENCOUNTER — Other Ambulatory Visit: Payer: Medicare Other

## 2021-06-16 ENCOUNTER — Ambulatory Visit: Payer: Medicare Other | Admitting: Nurse Practitioner

## 2021-07-26 ENCOUNTER — Ambulatory Visit: Payer: Medicare Other | Admitting: Internal Medicine

## 2021-08-03 ENCOUNTER — Other Ambulatory Visit: Payer: Medicare Other

## 2021-08-09 ENCOUNTER — Telehealth: Payer: Medicare Other

## 2021-08-13 ENCOUNTER — Ambulatory Visit: Payer: Medicare Other | Admitting: Urology
